# Patient Record
Sex: Male | Born: 1960 | Race: Black or African American | Hispanic: No | Smoking: Current every day smoker
Health system: Southern US, Community
[De-identification: ages and names within clinical notes are randomized; demographics above are authoritative.]

## PROBLEM LIST (undated history)

## (undated) ENCOUNTER — Emergency Department (HOSPITAL_COMMUNITY): Discharge status: Absent without leave | Payer: Self-pay

## (undated) DIAGNOSIS — G43909 Migraine, unspecified, not intractable, without status migrainosus: Secondary | ICD-10-CM

## (undated) DIAGNOSIS — G8929 Other chronic pain: Secondary | ICD-10-CM

## (undated) DIAGNOSIS — M545 Low back pain, unspecified: Secondary | ICD-10-CM

## (undated) DIAGNOSIS — E1110 Type 2 diabetes mellitus with ketoacidosis without coma: Secondary | ICD-10-CM

## (undated) DIAGNOSIS — E785 Hyperlipidemia, unspecified: Secondary | ICD-10-CM

## (undated) DIAGNOSIS — F419 Anxiety disorder, unspecified: Secondary | ICD-10-CM

## (undated) DIAGNOSIS — F32A Depression, unspecified: Secondary | ICD-10-CM

## (undated) DIAGNOSIS — I10 Essential (primary) hypertension: Secondary | ICD-10-CM

## (undated) DIAGNOSIS — F329 Major depressive disorder, single episode, unspecified: Secondary | ICD-10-CM

## (undated) DIAGNOSIS — E119 Type 2 diabetes mellitus without complications: Secondary | ICD-10-CM

## (undated) DIAGNOSIS — R569 Unspecified convulsions: Secondary | ICD-10-CM

## (undated) HISTORY — PX: NO PAST SURGERIES: SHX2092

---

## 2007-10-08 ENCOUNTER — Emergency Department (HOSPITAL_COMMUNITY): Admission: EM | Admit: 2007-10-08 | Discharge: 2007-10-08 | Payer: Self-pay | Admitting: Emergency Medicine

## 2007-10-17 ENCOUNTER — Emergency Department (HOSPITAL_COMMUNITY): Admission: EM | Admit: 2007-10-17 | Discharge: 2007-10-17 | Payer: Self-pay | Admitting: Emergency Medicine

## 2007-12-26 ENCOUNTER — Emergency Department (HOSPITAL_COMMUNITY): Admission: EM | Admit: 2007-12-26 | Discharge: 2007-12-26 | Payer: Self-pay | Admitting: Emergency Medicine

## 2008-01-12 ENCOUNTER — Emergency Department (HOSPITAL_COMMUNITY): Admission: EM | Admit: 2008-01-12 | Discharge: 2008-01-12 | Payer: Self-pay | Admitting: Emergency Medicine

## 2008-06-04 ENCOUNTER — Emergency Department (HOSPITAL_COMMUNITY): Admission: EM | Admit: 2008-06-04 | Discharge: 2008-06-04 | Payer: Self-pay | Admitting: Emergency Medicine

## 2008-06-06 ENCOUNTER — Emergency Department (HOSPITAL_COMMUNITY): Admission: EM | Admit: 2008-06-06 | Discharge: 2008-06-06 | Payer: Self-pay | Admitting: Emergency Medicine

## 2008-06-15 ENCOUNTER — Emergency Department (HOSPITAL_COMMUNITY): Admission: EM | Admit: 2008-06-15 | Discharge: 2008-06-15 | Payer: Self-pay | Admitting: Emergency Medicine

## 2008-06-19 ENCOUNTER — Emergency Department (HOSPITAL_COMMUNITY): Admission: EM | Admit: 2008-06-19 | Discharge: 2008-06-19 | Payer: Self-pay | Admitting: Family Medicine

## 2008-10-20 ENCOUNTER — Emergency Department (HOSPITAL_COMMUNITY): Admission: EM | Admit: 2008-10-20 | Discharge: 2008-10-20 | Payer: Self-pay | Admitting: Emergency Medicine

## 2008-11-02 ENCOUNTER — Ambulatory Visit (HOSPITAL_COMMUNITY): Admission: EM | Admit: 2008-11-02 | Discharge: 2008-11-02 | Payer: Self-pay | Admitting: Emergency Medicine

## 2008-11-26 ENCOUNTER — Encounter: Admission: RE | Admit: 2008-11-26 | Discharge: 2008-11-26 | Payer: Self-pay | Admitting: Family Medicine

## 2008-12-02 ENCOUNTER — Encounter: Admission: RE | Admit: 2008-12-02 | Discharge: 2009-01-11 | Payer: Self-pay | Admitting: Orthopedic Surgery

## 2009-02-28 ENCOUNTER — Emergency Department (HOSPITAL_COMMUNITY): Admission: EM | Admit: 2009-02-28 | Discharge: 2009-02-28 | Payer: Self-pay | Admitting: Emergency Medicine

## 2009-08-13 ENCOUNTER — Emergency Department (HOSPITAL_COMMUNITY): Admission: EM | Admit: 2009-08-13 | Discharge: 2009-08-13 | Payer: Self-pay | Admitting: Emergency Medicine

## 2010-02-01 ENCOUNTER — Emergency Department (HOSPITAL_COMMUNITY)
Admission: EM | Admit: 2010-02-01 | Discharge: 2010-02-01 | Payer: Self-pay | Source: Home / Self Care | Admitting: Emergency Medicine

## 2010-02-02 LAB — POCT I-STAT, CHEM 8
BUN: 8 mg/dL (ref 6–23)
Calcium, Ion: 1.16 mmol/L (ref 1.12–1.32)
Chloride: 101 mEq/L (ref 96–112)
Creatinine, Ser: 0.9 mg/dL (ref 0.4–1.5)
Glucose, Bld: 159 mg/dL — ABNORMAL HIGH (ref 70–99)
HCT: 45 % (ref 39.0–52.0)
Hemoglobin: 15.3 g/dL (ref 13.0–17.0)
Potassium: 4.1 mEq/L (ref 3.5–5.1)
Sodium: 138 mEq/L (ref 135–145)
TCO2: 31 mmol/L (ref 0–100)

## 2010-02-06 ENCOUNTER — Encounter: Payer: Self-pay | Admitting: Family Medicine

## 2010-04-02 LAB — GLUCOSE, CAPILLARY
Glucose-Capillary: 274 mg/dL — ABNORMAL HIGH (ref 70–99)
Glucose-Capillary: 307 mg/dL — ABNORMAL HIGH (ref 70–99)
Glucose-Capillary: 367 mg/dL — ABNORMAL HIGH (ref 70–99)

## 2010-04-02 LAB — POCT I-STAT, CHEM 8
BUN: 4 mg/dL — ABNORMAL LOW (ref 6–23)
Calcium, Ion: 1.11 mmol/L — ABNORMAL LOW (ref 1.12–1.32)
Chloride: 101 mEq/L (ref 96–112)
Creatinine, Ser: 0.9 mg/dL (ref 0.4–1.5)
Glucose, Bld: 350 mg/dL — ABNORMAL HIGH (ref 70–99)
HCT: 44 % (ref 39.0–52.0)
Hemoglobin: 15 g/dL (ref 13.0–17.0)
Potassium: 3.8 mEq/L (ref 3.5–5.1)
Sodium: 136 mEq/L (ref 135–145)
TCO2: 26 mmol/L (ref 0–100)

## 2010-04-06 LAB — URINALYSIS, ROUTINE W REFLEX MICROSCOPIC
Bilirubin Urine: NEGATIVE
Glucose, UA: >1000 mg/dL — AB
Hgb urine dipstick: NEGATIVE
Ketones, ur: 15 mg/dL — AB
Nitrite: NEGATIVE
Protein, ur: NEGATIVE mg/dL
Specific Gravity, Urine: 1.041 — ABNORMAL HIGH (ref 1.005–1.030)
Urobilinogen, UA: 0.2 mg/dL (ref 0.0–1.0)
pH: 6 (ref 5.0–8.0)

## 2010-04-06 LAB — URINE MICROSCOPIC-ADD ON

## 2010-04-06 LAB — BASIC METABOLIC PANEL
BUN: 10 mg/dL (ref 6–23)
CO2: 30 mEq/L (ref 19–32)
Calcium: 9.7 mg/dL (ref 8.4–10.5)
Chloride: 97 mEq/L (ref 96–112)
Creatinine, Ser: 0.89 mg/dL (ref 0.4–1.5)
GFR calc Af Amer: >60 mL/min (ref >60)
GFR calc non Af Amer: >60 mL/min (ref >60)
Glucose, Bld: 488 mg/dL — ABNORMAL HIGH (ref 70–99)
Potassium: 4.4 mEq/L (ref 3.5–5.1)
Sodium: 135 mEq/L (ref 135–145)

## 2010-04-06 LAB — GLUCOSE, CAPILLARY
Glucose-Capillary: 128 mg/dL — ABNORMAL HIGH (ref 70–99)
Glucose-Capillary: 339 mg/dL — ABNORMAL HIGH (ref 70–99)

## 2010-04-21 LAB — GLUCOSE, CAPILLARY: Glucose-Capillary: 242 mg/dL — ABNORMAL HIGH (ref 70–99)

## 2011-02-07 ENCOUNTER — Encounter (HOSPITAL_COMMUNITY): Payer: Self-pay | Admitting: Emergency Medicine

## 2011-02-07 ENCOUNTER — Emergency Department (HOSPITAL_COMMUNITY)
Admission: EM | Admit: 2011-02-07 | Discharge: 2011-02-07 | Disposition: A | Discharge status: Home / Self Care | Payer: Self-pay | Attending: Emergency Medicine | Admitting: Emergency Medicine

## 2011-02-07 DIAGNOSIS — Z794 Long term (current) use of insulin: Secondary | ICD-10-CM | POA: Insufficient documentation

## 2011-02-07 DIAGNOSIS — A599 Trichomoniasis, unspecified: Secondary | ICD-10-CM | POA: Insufficient documentation

## 2011-02-07 DIAGNOSIS — R21 Rash and other nonspecific skin eruption: Secondary | ICD-10-CM | POA: Insufficient documentation

## 2011-02-07 DIAGNOSIS — Z79899 Other long term (current) drug therapy: Secondary | ICD-10-CM | POA: Insufficient documentation

## 2011-02-07 DIAGNOSIS — L298 Other pruritus: Secondary | ICD-10-CM | POA: Insufficient documentation

## 2011-02-07 DIAGNOSIS — L2989 Other pruritus: Secondary | ICD-10-CM | POA: Insufficient documentation

## 2011-02-07 DIAGNOSIS — F341 Dysthymic disorder: Secondary | ICD-10-CM | POA: Insufficient documentation

## 2011-02-07 DIAGNOSIS — E119 Type 2 diabetes mellitus without complications: Secondary | ICD-10-CM | POA: Insufficient documentation

## 2011-02-07 HISTORY — DX: Anxiety disorder, unspecified: F41.9

## 2011-02-07 HISTORY — DX: Depression, unspecified: F32.A

## 2011-02-07 HISTORY — DX: Major depressive disorder, single episode, unspecified: F32.9

## 2011-02-07 LAB — URINALYSIS, ROUTINE W REFLEX MICROSCOPIC
Bilirubin Urine: NEGATIVE
Glucose, UA: >1000 mg/dL — AB
Hgb urine dipstick: NEGATIVE
Ketones, ur: NEGATIVE mg/dL
Leukocytes, UA: NEGATIVE
Nitrite: NEGATIVE
Protein, ur: NEGATIVE mg/dL
Specific Gravity, Urine: 1.031 — ABNORMAL HIGH (ref 1.005–1.030)
Urobilinogen, UA: 1 mg/dL (ref 0.0–1.0)
pH: 8 (ref 5.0–8.0)

## 2011-02-07 LAB — URINE MICROSCOPIC-ADD ON

## 2011-02-07 MED ORDER — METRONIDAZOLE 500 MG PO TABS
2000.0000 mg | ORAL_TABLET | Freq: Once | ORAL | Status: AC
  Administered 2011-02-07: 2000 mg via ORAL
  Filled 2011-02-07: qty 4

## 2011-02-07 NOTE — ED Provider Notes (Signed)
History     CSN: [id number]  Arrival date & time 02/07/11  1235   First MD Initiated Contact with Patient 02/07/11 1548      Chief Complaint  Patient presents with  . Rash    (Consider location/radiation/quality/duration/timing/severity/associated sxs/prior treatment) Patient is a 51 y.o. male presenting with rash. The history is provided by the patient. No language interpreter was used.  Rash  This is a new problem. The current episode started more than 2 days ago. The problem has been gradually worsening. The problem is associated with an unknown factor. There has been no fever. The rash is present on the torso, groin and genitalia. The patient is experiencing no pain. Associated symptoms include itching. Pertinent negatives include no blisters. He has tried nothing for the symptoms.    Past Medical History  Diagnosis Date  . Diabetes mellitus   . Depression   . Anxiety     History reviewed. No pertinent past surgical history.  No family history on file.  History  Substance Use Topics  . Smoking status: Current Everyday Smoker  . Smokeless tobacco: Not on file  . Alcohol Use: Yes      Review of Systems  Genitourinary: Negative for dysuria, discharge, genital sores and testicular pain.  Skin: Positive for itching and rash.  All other systems reviewed and are negative.    Allergies  Review of patient's allergies indicates no known allergies.  Home Medications   Current Outpatient Rx  Name Route Sig Dispense Refill  . CLONAZEPAM 1 MG PO TABS Oral Take 1-2 mg by mouth 2 (two) times daily. 1 mg in the morning 2 mg in the evening    . INSULIN ISOPHANE HUMAN 100 UNIT/ML [place] SUSP Subcutaneous Inject 20 Units into the skin 2 (two) times daily.      BP 158/72  Pulse 84  Temp(Src) 98.9 F (37.2 C) (Oral)  Resp 16  SpO2 97%  Physical Exam  Constitutional: He is oriented to person, place, and time. He appears well-developed and well-nourished.  HENT:  Head:  Normocephalic.  Eyes: Conjunctivae are normal. Pupils are equal, round, and reactive to light.  Neck: Normal range of motion. Neck supple.  Cardiovascular: Normal rate, regular rhythm, normal heart sounds and intact distal pulses.   Pulmonary/Chest: Effort normal and breath sounds normal.  Abdominal: Soft. Bowel sounds are normal.  Genitourinary: Testes normal and penis normal. No penile erythema or penile tenderness. No discharge found.  Musculoskeletal: Normal range of motion.  Neurological: He is alert and oriented to person, place, and time.  Skin: Skin is warm and dry.    ED Course  Procedures (including critical care time)  Labs Reviewed - No data to display No results found.   No diagnosis found.  No obvious genital lesions noted on exam.  Urine positive for trichomonas--treated in ED with flagyl.  MDM          Jimmye Norman, NP 02/07/11 [phone]

## 2011-02-07 NOTE — ED Provider Notes (Signed)
Medical screening examination/treatment/procedure(s) were performed by non-physician practitioner and as supervising physician I was immediately available for consultation/collaboration.   Benny Lennert, MD 02/07/11 [phone]

## 2011-02-07 NOTE — ED Notes (Signed)
Has  Bumps on private area  Since yesterday at they are itching

## 2011-03-17 ENCOUNTER — Emergency Department (HOSPITAL_COMMUNITY)
Admission: EM | Admit: 2011-03-17 | Discharge: 2011-03-17 | Disposition: A | Discharge status: Home / Self Care | Payer: Self-pay | Attending: Emergency Medicine | Admitting: Emergency Medicine

## 2011-03-17 ENCOUNTER — Encounter (HOSPITAL_COMMUNITY): Payer: Self-pay | Admitting: *Deleted

## 2011-03-17 DIAGNOSIS — F3289 Other specified depressive episodes: Secondary | ICD-10-CM | POA: Insufficient documentation

## 2011-03-17 DIAGNOSIS — R110 Nausea: Secondary | ICD-10-CM | POA: Insufficient documentation

## 2011-03-17 DIAGNOSIS — E119 Type 2 diabetes mellitus without complications: Secondary | ICD-10-CM | POA: Insufficient documentation

## 2011-03-17 DIAGNOSIS — R5381 Other malaise: Secondary | ICD-10-CM | POA: Insufficient documentation

## 2011-03-17 DIAGNOSIS — R739 Hyperglycemia, unspecified: Secondary | ICD-10-CM

## 2011-03-17 DIAGNOSIS — Z794 Long term (current) use of insulin: Secondary | ICD-10-CM | POA: Insufficient documentation

## 2011-03-17 DIAGNOSIS — F411 Generalized anxiety disorder: Secondary | ICD-10-CM | POA: Insufficient documentation

## 2011-03-17 DIAGNOSIS — F329 Major depressive disorder, single episode, unspecified: Secondary | ICD-10-CM | POA: Insufficient documentation

## 2011-03-17 DIAGNOSIS — F17200 Nicotine dependence, unspecified, uncomplicated: Secondary | ICD-10-CM | POA: Insufficient documentation

## 2011-03-17 LAB — POCT I-STAT, CHEM 8
Calcium, Ion: 1.16 mmol/L (ref 1.12–1.32)
Glucose, Bld: 322 mg/dL — ABNORMAL HIGH (ref 70–99)
HCT: 46 % (ref 39.0–52.0)
Hemoglobin: 15.6 g/dL (ref 13.0–17.0)
Potassium: 4 mEq/L (ref 3.5–5.1)

## 2011-03-17 LAB — KETONES, QUALITATIVE: Acetone, Bld: NEGATIVE

## 2011-03-17 LAB — GLUCOSE, CAPILLARY
Glucose-Capillary: 336 mg/dL — ABNORMAL HIGH (ref 70–99)
Glucose-Capillary: 350 mg/dL — ABNORMAL HIGH (ref 70–99)

## 2011-03-17 MED ORDER — INSULIN NPH (HUMAN) (ISOPHANE) 100 UNIT/ML ~~LOC~~ SUSP: 20.0000 [IU] | Freq: Two times a day (BID) | SUBCUTANEOUS | Status: DC

## 2011-03-17 NOTE — ED Provider Notes (Signed)
History     CSN: [id number]  Arrival date & time 03/17/11  1504   First MD Initiated Contact with Patient 03/17/11 1701      Chief Complaint  Patient presents with  . Hyperglycemia    (Consider location/radiation/quality/duration/timing/severity/associated sxs/prior treatment) HPI Comments: History of diabetes presenting with elevated blood sugar. States he's been out of his NPH for the past 2 days. Normally takes 20 units in the morning and 20 at night. He is recently changed counties does not have medicaid in [place].  He had one episode of vomiting yesterday. No abdominal pain, chest pain, shortness of breath. Does report increased thirst and urination.  The history is provided by the patient.    Past Medical History  Diagnosis Date  . Diabetes mellitus   . Depression   . Anxiety   . Anxiety     History reviewed. No pertinent past surgical history.  History reviewed. No pertinent family history.  History  Substance Use Topics  . Smoking status: Current Everyday Smoker  . Smokeless tobacco: Not on file  . Alcohol Use: Yes      Review of Systems  Constitutional: Positive for fatigue. Negative for fever, activity change and appetite change.  HENT: Negative for congestion and rhinorrhea.   Respiratory: Negative for cough, chest tightness and shortness of breath.   Cardiovascular: Negative for chest pain.  Gastrointestinal: Positive for nausea. Negative for vomiting and abdominal pain.  Musculoskeletal: Negative for back pain.  Skin: Negative for rash.  Neurological: Negative for dizziness, weakness and headaches.    Allergies  Review of patient's allergies indicates no known allergies.  Home Medications   Current Outpatient Rx  Name Route Sig Dispense Refill  . CLONAZEPAM 1 MG PO TABS Oral Take 1-2 mg by mouth 2 (two) times daily. 1 mg in the morning 2 mg in the evening    . INSULIN ISOPHANE HUMAN 100 UNIT/ML [place] SUSP Subcutaneous Inject 20 Units into  the skin 2 (two) times daily.    . INSULIN ISOPHANE HUMAN 100 UNIT/ML [place] SUSP Subcutaneous Inject 20 Units into the skin 2 (two) times daily. 10 mL 12    BP 180/96  Pulse 66  Temp(Src) 98.8 F (37.1 C) (Oral)  Resp 18  SpO2 99%  Physical Exam  Constitutional: He is oriented to person, place, and time. He appears well-developed and well-nourished. No distress.  HENT:  Head: Normocephalic and atraumatic.  Mouth/Throat: Oropharynx is clear and moist. No oropharyngeal exudate.  Eyes: Conjunctivae and EOM are normal. Pupils are equal, round, and reactive to light.  Neck: Normal range of motion. Neck supple.  Cardiovascular: Normal rate, regular rhythm and normal heart sounds.   Pulmonary/Chest: Effort normal and breath sounds normal. No respiratory distress.  Abdominal: Soft. There is no tenderness. There is no rebound and no guarding.  Musculoskeletal: Normal range of motion. He exhibits no edema and no tenderness.  Neurological: He is alert and oriented to person, place, and time. No cranial nerve deficit.  Skin: Skin is warm.    ED Course  Procedures (including critical care time)  Labs Reviewed  GLUCOSE, CAPILLARY - Abnormal; Notable for the following:    Glucose-Capillary 336 (*)    All other components within normal limits  GLUCOSE, CAPILLARY - Abnormal; Notable for the following:    Glucose-Capillary 350 (*)    All other components within normal limits  POCT I-STAT, CHEM 8 - Abnormal; Notable for the following:    Glucose, Bld 322 (*)  All other components within normal limits  KETONES, QUALITATIVE   No results found.   1. Hyperglycemia       MDM  Hyperglycemia. Clinical stable. No abdominal pain. R/o DKA.  Patient states she does have a prescription for his insulin but does not have the money to pay for it.  Jody MSW to discuss with patient.  Anion gap 7, bicarbonate 28, no evidence of DKA.      Glynn Octave, MD 03/17/11 2133

## 2011-03-17 NOTE — Discharge Instructions (Signed)
Hyperglycemia Hyperglycemia occurs when the glucose (sugar) in your blood is too high. Hyperglycemia can happen for many reasons, but it most often happens to people who do not know they have diabetes or are not managing their diabetes properly.  CAUSES  Whether you have diabetes or not, there are other causes of hyperglycemia. Hyperglycemia can occur when you have diabetes, but it can also occur in other situations that you might not be as aware of, such as: Diabetes  If you have diabetes and are having problems controlling your blood glucose, hyperglycemia could occur because of some of the following reasons:   Not following your meal plan.   Not taking your diabetes medications or not taking it properly.   Exercising less or doing less activity than you normally do.   Being sick.  Pre-diabetes  This cannot be ignored. Before people develop Type 2 diabetes, they almost always have "pre-diabetes." This is when your blood glucose levels are higher than normal, but not yet high enough to be diagnosed as diabetes. Research has shown that some long-term damage to the body, especially the heart and circulatory system, may already be occurring during pre-diabetes. If you take action to manage your blood glucose when you have pre-diabetes, you may delay or prevent Type 2 diabetes from developing.  Stress  If you have diabetes, you may be "diet" controlled or on oral medications or insulin to control your diabetes. However, you may find that your blood glucose is higher than usual in the hospital whether you have diabetes or not. This is often referred to as "stress hyperglycemia." Stress can elevate your blood glucose. This happens because of hormones put out by the body during times of stress. If stress has been the cause of your high blood glucose, it can be followed regularly by your caregiver. That way he/she can make sure your hyperglycemia does not continue to get worse or progress to diabetes.    Steroids  Steroids are medications that act on the infection fighting system (immune system) to block inflammation or infection. One side effect can be a rise in blood glucose. Most people can produce enough extra insulin to allow for this rise, but for those who cannot, steroids make blood glucose levels go even higher. It is not unusual for steroid treatments to "uncover" diabetes that is developing. It is not always possible to determine if the hyperglycemia will go away after the steroids are stopped. A special blood test called an A1c is sometimes done to determine if your blood glucose was elevated before the steroids were started.  SYMPTOMS  Thirsty.   Frequent urination.   Dry mouth.   Blurred vision.   Tired or fatigue.   Weakness.   Sleepy.   Tingling in feet or leg.  DIAGNOSIS  Diagnosis is made by monitoring blood glucose in one or all of the following ways:  A1c test. This is a chemical found in your blood.   Fingerstick blood glucose monitoring.   Laboratory results.  TREATMENT  First, knowing the cause of the hyperglycemia is important before the hyperglycemia can be treated. Treatment may include, but is not be limited to:  Education.   Change or adjustment in medications.   Change or adjustment in meal plan.   Treatment for an illness, infection, etc.   More frequent blood glucose monitoring.   Change in exercise plan.   Decreasing or stopping steroids.   Lifestyle changes.  HOME CARE INSTRUCTIONS   Test your blood glucose  as directed.   Exercise regularly. Your caregiver will give you instructions about exercise. Pre-diabetes or diabetes which comes on with stress is helped by exercising.   Eat wholesome, balanced meals. Eat often and at regular, fixed times. Your caregiver or nutritionist will give you a meal plan to guide your sugar intake.   Being at an ideal weight is important. If needed, losing as little as 10 to 15 pounds may help  improve blood glucose levels.  SEEK MEDICAL CARE IF:   You have questions about medicine, activity, or diet.   You continue to have symptoms (problems such as increased thirst, urination, or weight gain).  SEEK IMMEDIATE MEDICAL CARE IF:   You are vomiting or have diarrhea.   Your breath smells fruity.   You are breathing faster or slower.   You are very sleepy or incoherent.   You have numbness, tingling, or pain in your feet or hands.   You have chest pain.   Your symptoms get worse even though you have been following your caregiver's orders.   If you have any other questions or concerns.  Document Released: 06/28/2000 Document Revised: 09/14/2010 Document Reviewed: 08/24/2008 [facility] Patient Information 2012 [place], [place]. RESOURCE GUIDE  Dental Problems  Patients with Medicaid: [facility] [phone]                                           [phone] [address] Phone:  [phone]                                                  Phone:  [phone]  If unable to pay or uninsured, contact:  [facility] or [facility]. to become qualified for the adult dental clinic.  Chronic Pain Problems Contact [facility]  [phone] Patients need to be referred by their primary care doctor.  Insufficient Money for Medicine Contact United Way:  call "211" or [facility] 6200219557.  No Primary Care Doctor Call [facility]  [phone] Other agencies that provide inexpensive medical care    [facility]  [phone]    [facility]  [phone]    [facility]  [phone]    [facility]  [phone]    [facility]  (414)577-1664    [facility]  [phone]  Psychological Services [facility]  [phone] [facility] Services  [phone] [facility]   7346630020 (emergency services (401) [phone])  Substance Abuse  Resources [facility] Drug Services  (352) 323-6903 [facility] 8655925450 [facility] 2090586736 Floydene Flock 435-718-4343 Residential & Outpatient Substance Abuse Program  401-789-1914  Abuse/Neglect [facility] Child Abuse Hotline 260-559-0501 [facility] Child Abuse Hotline 682-138-9195 (After Hours)  Emergency Shelter [facility] [phone]  Maternity Homes [facility] at the [place] [facility] [place] (347) 617-8458 [facility] (812)[phone]  MRSA Hotline #:   718-730-9776    [facility]  [facility] Dept. [address]      [place] Phone:  [phone]                                   Phone:  [phone]                 Phone:  [place] Phone:  [place] [phone] (After Hours)

## 2011-03-17 NOTE — ED Notes (Signed)
Pt states that he is not able to get into a clinic to get his sugar monitored. Pt states he has been taking insulin but ran out. Pt reports increased thirst and urination.

## 2011-03-17 NOTE — ED Notes (Signed)
Pt. Given a lunch to eat.

## 2011-03-21 ENCOUNTER — Encounter (HOSPITAL_COMMUNITY): Payer: Self-pay | Admitting: Emergency Medicine

## 2011-03-21 ENCOUNTER — Emergency Department (HOSPITAL_COMMUNITY)
Admission: EM | Admit: 2011-03-21 | Discharge: 2011-03-21 | Disposition: A | Discharge status: Home / Self Care | Payer: Self-pay | Attending: Emergency Medicine | Admitting: Emergency Medicine

## 2011-03-21 DIAGNOSIS — Z9119 Patient's noncompliance with other medical treatment and regimen: Secondary | ICD-10-CM | POA: Insufficient documentation

## 2011-03-21 DIAGNOSIS — F411 Generalized anxiety disorder: Secondary | ICD-10-CM | POA: Insufficient documentation

## 2011-03-21 DIAGNOSIS — E119 Type 2 diabetes mellitus without complications: Secondary | ICD-10-CM | POA: Insufficient documentation

## 2011-03-21 DIAGNOSIS — Z9114 Patient's other noncompliance with medication regimen: Secondary | ICD-10-CM

## 2011-03-21 DIAGNOSIS — F17200 Nicotine dependence, unspecified, uncomplicated: Secondary | ICD-10-CM | POA: Insufficient documentation

## 2011-03-21 DIAGNOSIS — Z91199 Patient's noncompliance with other medical treatment and regimen due to unspecified reason: Secondary | ICD-10-CM | POA: Insufficient documentation

## 2011-03-21 DIAGNOSIS — F329 Major depressive disorder, single episode, unspecified: Secondary | ICD-10-CM | POA: Insufficient documentation

## 2011-03-21 DIAGNOSIS — R739 Hyperglycemia, unspecified: Secondary | ICD-10-CM

## 2011-03-21 DIAGNOSIS — F3289 Other specified depressive episodes: Secondary | ICD-10-CM | POA: Insufficient documentation

## 2011-03-21 DIAGNOSIS — Z794 Long term (current) use of insulin: Secondary | ICD-10-CM | POA: Insufficient documentation

## 2011-03-21 LAB — BASIC METABOLIC PANEL
BUN: 14 mg/dL (ref 6–23)
Creatinine, Ser: 0.77 mg/dL (ref 0.50–1.35)
GFR calc Af Amer: >90 mL/min (ref >90)
GFR calc non Af Amer: >90 mL/min (ref >90)
Potassium: 4.5 mEq/L (ref 3.5–5.1)

## 2011-03-21 LAB — CBC
MCHC: 33.2 g/dL (ref 30.0–36.0)
RDW: 12.6 % (ref 11.5–15.5)

## 2011-03-21 MED ORDER — SODIUM CHLORIDE 0.9 % IV BOLUS (SEPSIS): 1000.0000 mL | Freq: Once | INTRAVENOUS | Status: DC

## 2011-03-21 MED ORDER — INSULIN ASPART PROT & ASPART (70-30 MIX) 100 UNIT/ML ~~LOC~~ SUSP
20.0000 [IU] | Freq: Once | SUBCUTANEOUS | Status: AC
  Administered 2011-03-21: 20 [IU] via SUBCUTANEOUS
  Filled 2011-03-21: qty 3

## 2011-03-21 NOTE — Discharge Instructions (Signed)
 RESOURCE GUIDE  Dental Problems  Patients with Medicaid: [facility]                     [facility] [address]                                           [address] Phone:  [phone]                                                  Phone:  [phone]  If unable to pay or uninsured, contact:  [facility] or [facility] to become qualified for the adult dental clinic.  Chronic Pain Problems Contact [facility]  [phone] Patients need to be referred by their primary care doctor.  Insufficient Money for Medicine Contact United Way:  call "211" or [facility] 271-5999.  No Primary Care Doctor Call [facility]  832-8000 Other agencies that provide inexpensive medical care    [facility]  [phone]    [facility]  [phone]    [facility]  [phone]    Women's Clinic  [phone]    [facility]  [phone]    [facility]  [phone]  Psychological Services [facility]  [phone] [facility]  [phone] [facility]   [phone] (emergency services [phone])  Substance Abuse Resources Alcohol and Drug Services  336-882-2125 [facility] 336-784-9470 [facility] 336-285-9073 [facility] 336-845-3988 Residential & Outpatient Substance Abuse Program  800-659-3381  Abuse/Neglect [place] Child Abuse Hotline (336) 641-3795 [place] Child Abuse Hotline 800-378-5315 (After Hours)  Emergency Shelter [facility] (336) 271-5985  Maternity Homes [facility] [place] (336) 275-9566 [facility] (704) 372-4663  MRSA Hotline #:   832-7006    [place] Resources  [facility] [place]     United Way                          [facility] [address] [place]                       [address]      [address]  [place]                                                [place]                            [place] Phone:  [phone]                                   Phone:  [phone]                 Phone:  [phone]  [place] [facility] Phone:    [phone]  [place] Child Abuse Hotline (336) 342-1394 (336) 342-3537 (After Hours)   

## 2011-03-21 NOTE — ED Notes (Signed)
Here for insulin  Was here the other day and given rx but did not take it because he did not have $

## 2011-03-21 NOTE — ED Provider Notes (Addendum)
History     CSN: [id number]  Arrival date & time 03/21/11  1422   First MD Initiated Contact with Patient 03/21/11 1827      Chief Complaint  Patient presents with  . Hyperglycemia     The history is provided by the patient.   patient reports he is currently out of his insulin and needs additional insulin.  He has already been seen by the ER social worker in the emergency department and given outpatient resources.  He reports he feels fine at this time.  He has no chest pain or shortness of breath.  He has no nausea vomiting or diarrhea.  He has no lightheadedness or weakness.  He reports his finances are not allowing for him to pay for his insulin.    Past Medical History  Diagnosis Date  . Diabetes mellitus   . Depression   . Anxiety   . Anxiety     History reviewed. No pertinent past surgical history.  No family history on file.  History  Substance Use Topics  . Smoking status: Current Everyday Smoker  . Smokeless tobacco: Not on file  . Alcohol Use: Yes      Review of Systems  All other systems reviewed and are negative.    Allergies  Review of patient's allergies indicates no known allergies.  Home Medications   Current Outpatient Rx  Name Route Sig Dispense Refill  . CLONAZEPAM 1 MG PO TABS Oral Take 1-2 mg by mouth 2 (two) times daily. Take 1 tablet in the morning and 2 tablets at bedtime    . INSULIN ISOPHANE HUMAN 100 UNIT/ML [place] SUSP Subcutaneous Inject 20 Units into the skin 2 (two) times daily.    . TETRAHYDROZOLINE HCL 0.05 % OP SOLN Left Eye Place 1 drop into the left eye 2 (two) times daily as needed. For eye irritation      BP 159/87  Pulse 87  Temp 99.3 F (37.4 C)  Resp 18  SpO2 98%  Physical Exam  Nursing note and vitals reviewed. Constitutional: He is oriented to person, place, and time. He appears well-developed and well-nourished.  HENT:  Head: Normocephalic and atraumatic.  Eyes: EOM are normal.  Neck: Normal range of motion.    Cardiovascular: Normal rate, regular rhythm, normal heart sounds and intact distal pulses.   Pulmonary/Chest: Effort normal and breath sounds normal. No respiratory distress.  Abdominal: Soft. He exhibits no distension. There is no tenderness.  Musculoskeletal: Normal range of motion.  Neurological: He is alert and oriented to person, place, and time.  Skin: Skin is warm and dry.  Psychiatric: He has a normal mood and affect. Judgment normal.    ED Course  Procedures (including critical care time)  Labs Reviewed  GLUCOSE, CAPILLARY - Abnormal; Notable for the following:    Glucose-Capillary 422 (*)    All other components within normal limits  BASIC METABOLIC PANEL - Abnormal; Notable for the following:    Sodium 133 (*)    Glucose, Bld 352 (*)    All other components within normal limits  CBC   No results found.   1. Hyperglycemia   2. Hx of medication noncompliance       MDM  The patient has known diabetes.  He's been noncompliant with his insulin because he does not have the money to afford it.  His blood sugar here is 352.  He has no evidence of diabetic ketoacidosis.  He feels fine.  His vital signs are  otherwise normal.  His had been seen by the social worker here in the ER and has been arranged as an outpatient to obtain his medications through obtaining an orange card.  I suspect his blood sugar is normally poorly controlled and 350 for him is probably not very far off his baseline.         Lyanne Co, MD 03/22/11 1610  Lyanne Co, MD 04/21/11 [phone]

## 2011-03-21 NOTE — ED Notes (Signed)
Pt does not want IV placement or IV fluids until he speaks with the MD

## 2011-03-23 NOTE — ED Notes (Signed)
Rcvd call regarding pts inability to pay for prescribed Insulin will call in to [facility] outpatient pharmacy and a nurse from congregational nursing program will pick up for pt.

## 2011-09-29 ENCOUNTER — Inpatient Hospital Stay (HOSPITAL_COMMUNITY): Admission: AD | Admit: 2011-09-29 | Payer: Self-pay | Source: Ambulatory Visit | Admitting: Obstetrics & Gynecology

## 2014-04-20 ENCOUNTER — Emergency Department (HOSPITAL_COMMUNITY): Payer: Self-pay

## 2014-04-20 ENCOUNTER — Encounter (HOSPITAL_COMMUNITY): Payer: Self-pay | Admitting: Emergency Medicine

## 2014-04-20 ENCOUNTER — Emergency Department (HOSPITAL_COMMUNITY)
Admission: EM | Admit: 2014-04-20 | Discharge: 2014-04-20 | Disposition: A | Discharge status: Home / Self Care | Payer: Self-pay | Attending: Emergency Medicine | Admitting: Emergency Medicine

## 2014-04-20 ENCOUNTER — Other Ambulatory Visit (HOSPITAL_COMMUNITY): Payer: Self-pay

## 2014-04-20 DIAGNOSIS — F329 Major depressive disorder, single episode, unspecified: Secondary | ICD-10-CM | POA: Insufficient documentation

## 2014-04-20 DIAGNOSIS — R0609 Other forms of dyspnea: Secondary | ICD-10-CM

## 2014-04-20 DIAGNOSIS — Z794 Long term (current) use of insulin: Secondary | ICD-10-CM | POA: Insufficient documentation

## 2014-04-20 DIAGNOSIS — F419 Anxiety disorder, unspecified: Secondary | ICD-10-CM | POA: Insufficient documentation

## 2014-04-20 DIAGNOSIS — I10 Essential (primary) hypertension: Secondary | ICD-10-CM | POA: Insufficient documentation

## 2014-04-20 DIAGNOSIS — Z720 Tobacco use: Secondary | ICD-10-CM | POA: Insufficient documentation

## 2014-04-20 DIAGNOSIS — E119 Type 2 diabetes mellitus without complications: Secondary | ICD-10-CM | POA: Insufficient documentation

## 2014-04-20 DIAGNOSIS — R0600 Dyspnea, unspecified: Secondary | ICD-10-CM | POA: Insufficient documentation

## 2014-04-20 DIAGNOSIS — Z79899 Other long term (current) drug therapy: Secondary | ICD-10-CM | POA: Insufficient documentation

## 2014-04-20 HISTORY — DX: Essential (primary) hypertension: I10

## 2014-04-20 HISTORY — DX: Hyperlipidemia, unspecified: E78.5

## 2014-04-20 LAB — BASIC METABOLIC PANEL
Anion gap: 15 (ref 5–15)
BUN: 9 mg/dL (ref 6–23)
CALCIUM: 8.9 mg/dL (ref 8.4–10.5)
CO2: 23 mmol/L (ref 19–32)
CREATININE: 0.77 mg/dL (ref 0.50–1.35)
Chloride: 98 mmol/L (ref 96–112)
GLUCOSE: 233 mg/dL — AB (ref 70–99)
Potassium: 3.8 mmol/L (ref 3.5–5.1)
Sodium: 136 mmol/L (ref 135–145)

## 2014-04-20 LAB — CBC WITH DIFFERENTIAL/PLATELET
Basophils Absolute: 0 10*3/uL (ref 0.0–0.1)
Basophils Relative: 1 % (ref 0–1)
EOS ABS: 0.1 10*3/uL (ref 0.0–0.7)
EOS PCT: 2 % (ref 0–5)
HEMATOCRIT: 35.4 % — AB (ref 39.0–52.0)
HEMOGLOBIN: 11.5 g/dL — AB (ref 13.0–17.0)
LYMPHS ABS: 2.3 10*3/uL (ref 0.7–4.0)
LYMPHS PCT: 42 % (ref 12–46)
MCH: 25.9 pg — AB (ref 26.0–34.0)
MCHC: 32.5 g/dL (ref 30.0–36.0)
MCV: 79.7 fL (ref 78.0–100.0)
MONO ABS: 0.5 10*3/uL (ref 0.1–1.0)
MONOS PCT: 9 % (ref 3–12)
Neutro Abs: 2.6 10*3/uL (ref 1.7–7.7)
Neutrophils Relative %: 46 % (ref 43–77)
PLATELETS: 194 10*3/uL (ref 150–400)
RBC: 4.44 MIL/uL (ref 4.22–5.81)
RDW: 13.7 % (ref 11.5–15.5)
WBC: 5.6 10*3/uL (ref 4.0–10.5)

## 2014-04-20 LAB — CBG MONITORING, ED: Glucose-Capillary: 214 mg/dL — ABNORMAL HIGH (ref 70–99)

## 2014-04-20 LAB — TROPONIN I
Troponin I: <0.03 ng/mL
Troponin I: <0.03 ng/mL

## 2014-04-20 NOTE — Progress Notes (Addendum)
CSW stopped in hallway after patient was discharged. Pt requesting assistance with diabetic supplies. Pt aware that rn cm can assist further. Pt and family waiting in waiting room however can also be reached by phone. CSW looking into available resources.   Olga CoasterKristen Vasily Fedewa, [name]  Clinical Social Work  [facility] [phone]    CSW [facility], and talk with congregational nursing about diabetic supplies. CSW and RN CM to follow up with patient regarding potential aditional resources. CSW confirmed number in chart is patient best contact number. Pt asked csw if he could be given dose of anxiety medication for the day. CSW discussed with assisted director of ED, as patient has been discharged patient would have to check back in, and it still was not a guarantee of receiving medications.   Olga CoasterKristen Tandi Hanko, [name]  Clinical Social Work  [facility] [phone]

## 2014-04-20 NOTE — ED Notes (Signed)
Pt states for the past week he has been "having difficulty breathing." At this time respirations are regular and unlabored, lung sounds clear. Pt also states that sometimes while he is being active it feels like his heart is racing. Pt states he has hx of hypertension, high cholesterol, and anxiety and he can't afford his medications.

## 2014-04-20 NOTE — ED Notes (Signed)
Patient states he is having trouble breathing x1 week. Patient speaking in full sentences, NAD noted. Patient with multiple medical conditions states he is not taking his home meds, last dose >1 week ago.

## 2014-04-20 NOTE — ED Provider Notes (Signed)
CSN: [id number]     Arrival date & time 04/20/14  0059 History   First MD Initiated Contact with Patient 04/20/14 0132     Chief Complaint  Patient presents with  . difficulty breathing x1 week      (Consider location/radiation/quality/duration/timing/severity/associated sxs/prior Treatment) Patient is a 54 y.o. male presenting with shortness of breath. The history is provided by the patient. No language interpreter was used.  Shortness of Breath Severity:  Mild Duration:  1 week Associated symptoms: no abdominal pain, no chest pain, no cough and no fever   Associated symptoms comment:  Exertional SOB for the past week. He denies cough, fever or chest pain. The patient is a difficult historian. He has a history significant for insulin dependent DM, HTN, anxiety. No nausea, vomiting. He is currently asymptomatic.    Past Medical History  Diagnosis Date  . Diabetes mellitus   . Depression   . Anxiety   . Anxiety   . Hypertension   . Hyperlipemia    History reviewed. No pertinent past surgical history. History reviewed. No pertinent family history. History  Substance Use Topics  . Smoking status: Current Every Day Smoker -- 0.10 packs/day    Types: Cigarettes  . Smokeless tobacco: Not on file  . Alcohol Use: 7.2 oz/week    12 Cans of beer per week    Review of Systems  Constitutional: Negative for fever and chills.  HENT: Negative.   Respiratory: Positive for shortness of breath. Negative for cough.   Cardiovascular: Negative.  Negative for chest pain.  Gastrointestinal: Negative.  Negative for abdominal pain.  Musculoskeletal: Negative.  Negative for myalgias.  Skin: Negative.   Neurological: Negative.       Allergies  Ibuprofen  Home Medications   Prior to Admission medications   Medication Sig Start Date End Date Taking? Authorizing Provider  clonazePAM (KLONOPIN) 1 MG tablet Take 1-2 mg by mouth 2 (two) times daily. Take 1 tablet in the morning and 2 tablets  at bedtime    Historical Provider, MD  insulin NPH (HUMULIN N,NOVOLIN N) 100 UNIT/ML injection Inject 20 Units into the skin 2 (two) times daily.    Historical Provider, MD  tetrahydrozoline 0.05 % ophthalmic solution Place 1 drop into the left eye 2 (two) times daily as needed. For eye irritation    Historical Provider, MD   BP 180/66 mmHg  Pulse 88  Temp(Src) 98.3 F (36.8 C) (Oral)  Resp 20  SpO2 100% Physical Exam  Constitutional: He is oriented to person, place, and time. He appears well-developed and well-nourished.  HENT:  Head: Normocephalic.  Neck: Normal range of motion. Neck supple.  Cardiovascular: Normal rate and regular rhythm.   Pulmonary/Chest: Effort normal and breath sounds normal.  Abdominal: Soft. Bowel sounds are normal. There is no tenderness. There is no rebound and no guarding.  Musculoskeletal: Normal range of motion. He exhibits no edema.  Neurological: He is alert and oriented to person, place, and time.  Skin: Skin is warm and dry. No rash noted.  Psychiatric: He has a normal mood and affect.    ED Course  Procedures (including critical care time) Labs Review Labs Reviewed  CBC WITH DIFFERENTIAL/PLATELET - Abnormal; Notable for the following:    Hemoglobin 11.5 (*)    HCT 35.4 (*)    MCH 25.9 (*)    All other components within normal limits  CBG MONITORING, ED - Abnormal; Notable for the following:    Glucose-Capillary 214 (*)  All other components within normal limits  TROPONIN I  BASIC METABOLIC PANEL   Results for orders placed or performed during the hospital encounter of 04/20/14  Troponin I  Result Value Ref Range   Troponin I <0.03 <0.031 ng/mL  Basic metabolic panel  Result Value Ref Range   Sodium 136 135 - 145 mmol/L   Potassium 3.8 3.5 - 5.1 mmol/L   Chloride 98 96 - 112 mmol/L   CO2 23 19 - 32 mmol/L   Glucose, Bld 233 (H) 70 - 99 mg/dL   BUN 9 6 - 23 mg/dL   Creatinine, Ser 1.610.77 0.50 - 1.35 mg/dL   Calcium 8.9 8.4 - 09.610.5  mg/dL   GFR calc non Af Amer >90 >90 mL/min   GFR calc Af Amer >90 >90 mL/min   Anion gap 15 5 - 15  CBC with Differential  Result Value Ref Range   WBC 5.6 4.0 - 10.5 K/uL   RBC 4.44 4.22 - 5.81 MIL/uL   Hemoglobin 11.5 (L) 13.0 - 17.0 g/dL   HCT 04.535.4 (L) 40.939.0 - 81.152.0 %   MCV 79.7 78.0 - 100.0 fL   MCH 25.9 (L) 26.0 - 34.0 pg   MCHC 32.5 30.0 - 36.0 g/dL   RDW 91.413.7 78.211.5 - 95.615.5 %   Platelets 194 150 - 400 K/uL   Neutrophils Relative % 46 43 - 77 %   Neutro Abs 2.6 1.7 - 7.7 K/uL   Lymphocytes Relative 42 12 - 46 %   Lymphs Abs 2.3 0.7 - 4.0 K/uL   Monocytes Relative 9 3 - 12 %   Monocytes Absolute 0.5 0.1 - 1.0 K/uL   Eosinophils Relative 2 0 - 5 %   Eosinophils Absolute 0.1 0.0 - 0.7 K/uL   Basophils Relative 1 0 - 1 %   Basophils Absolute 0.0 0.0 - 0.1 K/uL  Troponin I  Result Value Ref Range   Troponin I <0.03 <0.031 ng/mL  CBG monitoring, ED  Result Value Ref Range   Glucose-Capillary 214 (H) 70 - 99 mg/dL   Comment 1 Notify RN    Dg Chest 2 View  04/20/2014   CLINICAL DATA:  Difficulty breathing for a week. Shortness of breath. Feels like heart racing while he is being active. Current smoker.  EXAM: CHEST  2 VIEW  COMPARISON:  06/19/2008  FINDINGS: The heart size and mediastinal contours are within normal limits. Both lungs are clear. The visualized skeletal structures are unremarkable.  IMPRESSION: No active cardiopulmonary disease.   Electronically Signed   By: Burman NievesWilliam  Stevens M.D.   On: 04/20/2014 02:50    Imaging Review No results found.   EKG Interpretation None      MDM   Final diagnoses:  None    1. Dyspnea  The patient had a concerning EKG without comparable. REviewed by Dr. Patria Maneampos who saw the patient to attempt to elicit a more indepth history, which continues to be difficult. The patient denies chest pain. SOB is intermittent. No cough, fever. Normal lab studies including delta troponin - doubt ACS despite EKG changes. ST elevations likely early  repolarization and LVH - per Dr. Patria Maneampos. Stable for discharge. Will refer to cardiology for consideration of holter monitor.     Elpidio AnisShari Glyndon Tursi, PA-C 04/20/14 0559  Azalia BilisKevin Campos, MD 04/20/14 [phone]

## 2014-04-20 NOTE — Discharge Instructions (Signed)

## 2014-04-25 ENCOUNTER — Encounter (HOSPITAL_COMMUNITY): Payer: Self-pay | Admitting: Emergency Medicine

## 2014-04-25 ENCOUNTER — Emergency Department (HOSPITAL_COMMUNITY)
Admission: EM | Admit: 2014-04-25 | Discharge: 2014-04-26 | Disposition: A | Discharge status: Home / Self Care | Payer: Self-pay | Attending: Emergency Medicine | Admitting: Emergency Medicine

## 2014-04-25 DIAGNOSIS — E119 Type 2 diabetes mellitus without complications: Secondary | ICD-10-CM | POA: Insufficient documentation

## 2014-04-25 DIAGNOSIS — F329 Major depressive disorder, single episode, unspecified: Secondary | ICD-10-CM | POA: Insufficient documentation

## 2014-04-25 DIAGNOSIS — F419 Anxiety disorder, unspecified: Secondary | ICD-10-CM | POA: Insufficient documentation

## 2014-04-25 DIAGNOSIS — Z720 Tobacco use: Secondary | ICD-10-CM | POA: Insufficient documentation

## 2014-04-25 DIAGNOSIS — Z79899 Other long term (current) drug therapy: Secondary | ICD-10-CM | POA: Insufficient documentation

## 2014-04-25 DIAGNOSIS — Z794 Long term (current) use of insulin: Secondary | ICD-10-CM | POA: Insufficient documentation

## 2014-04-25 DIAGNOSIS — I10 Essential (primary) hypertension: Secondary | ICD-10-CM | POA: Insufficient documentation

## 2014-04-25 MED ORDER — HYDROXYZINE HCL 10 MG PO TABS: 10.0000 mg | ORAL_TABLET | Freq: Four times a day (QID) | ORAL | Status: DC | PRN

## 2014-04-25 MED ORDER — HYDROXYZINE HCL 10 MG PO TABS
10.0000 mg | ORAL_TABLET | Freq: Once | ORAL | Status: AC
  Administered 2014-04-26: 10 mg via ORAL
  Filled 2014-04-25: qty 1

## 2014-04-25 NOTE — ED Notes (Signed)
Pt reports increasing anxiety since running out of anxiety medication x 4 days ago; pt reports "cant think right"; pt denies SI/HI thoughts at this time

## 2014-04-25 NOTE — Discharge Instructions (Signed)
Generalized Anxiety Disorder Generalized anxiety disorder (GAD) is a mental disorder. It interferes with life functions, including relationships, work, and school. GAD is different from normal anxiety, which everyone experiences at some point in their lives in response to specific life events and activities. Normal anxiety actually helps us prepare for and get through these life events and activities. Normal anxiety goes away after the event or activity is over.  GAD causes anxiety that is not necessarily related to specific events or activities. It also causes excess anxiety in proportion to specific events or activities. The anxiety associated with GAD is also difficult to control. GAD can vary from mild to severe. People with severe GAD can have intense waves of anxiety with physical symptoms (panic attacks).  SYMPTOMS The anxiety and worry associated with GAD are difficult to control. This anxiety and worry are related to many life events and activities and also occur more days than not for 6 months or longer. People with GAD also have three or more of the following symptoms (one or more in children):  Restlessness.   Fatigue.  Difficulty concentrating.   Irritability.  Muscle tension.  Difficulty sleeping or unsatisfying sleep. DIAGNOSIS GAD is diagnosed through an assessment by your health care provider. Your health care provider will ask you questions aboutyour mood,physical symptoms, and events in your life. Your health care provider may ask you about your medical history and use of alcohol or drugs, including prescription medicines. Your health care provider may also do a physical exam and blood tests. Certain medical conditions and the use of certain substances can cause symptoms similar to those associated with GAD. Your health care provider may refer you to a mental health specialist for further evaluation. TREATMENT The following therapies are usually used to treat GAD:    Medication. Antidepressant medication usually is prescribed for long-term daily control. Antianxiety medicines may be added in severe cases, especially when panic attacks occur.   Talk therapy (psychotherapy). Certain types of talk therapy can be helpful in treating GAD by providing support, education, and guidance. A form of talk therapy called cognitive behavioral therapy can teach you healthy ways to think about and react to daily life events and activities.  Stress managementtechniques. These include yoga, meditation, and exercise and can be very helpful when they are practiced regularly. A mental health specialist can help determine which treatment is best for you. Some people see improvement with one therapy. However, other people require a combination of therapies. Document Released: 04/29/2012 Document Revised: 05/19/2013 Document Reviewed: 04/29/2012 Exit[employer] Patient Information 2015 [employer]. This information is not intended to replace advice given to you by your health care provider. Make sure you discuss any questions you have with your health care provider.  

## 2014-04-25 NOTE — ED Notes (Signed)
PA Dansie at bedside  

## 2014-04-26 NOTE — ED Provider Notes (Signed)
CSN: [id number]     Arrival date & time 04/25/14  2116 History   First MD Initiated Contact with Patient 04/25/14 2341     Chief Complaint  Patient presents with  . Anxiety  . Medication Refill   Mike [name] is a 54 y.o. male with a history of anxiety and depression who presents to the emergency department complaining of increased anxiety over the past 4 days as he is out of his anxiety medication. He is requesting a refill on his Klonopin. He reports his Klonopin is normally prescribed by a provider at [facility] however he was too early with his refill recently. He reports he will follow up with [facility] on Monday. Patient reports he's been out of his Klonopin for 4 days now. The patient denies suicidal or homicidal ideations. He denies depressed mood. He just reports feeling anxious. He reports his breathing has improved since he was last seen in the emergency department. The patient denies fevers, chills, suicidal ideations, homicidal ideations, shortness of breath, or rashes. He denies needing refills on other medications.   (Consider location/radiation/quality/duration/timing/severity/associated sxs/prior Treatment) HPI  Past Medical History  Diagnosis Date  . Diabetes mellitus   . Depression   . Anxiety   . Anxiety   . Hypertension   . Hyperlipemia    History reviewed. No pertinent past surgical history. No family history on file. History  Substance Use Topics  . Smoking status: Current Every Day Smoker -- 0.10 packs/day    Types: Cigarettes  . Smokeless tobacco: Not on file  . Alcohol Use: 7.2 oz/week    12 Cans of beer per week     Comment: occasional    Review of Systems  Constitutional: Negative for fever and chills.  Respiratory: Negative for shortness of breath.   Skin: Negative for rash.  Psychiatric/Behavioral: Negative for suicidal ideas, hallucinations, confusion, sleep disturbance and dysphoric mood. The patient is nervous/anxious.       Allergies   Ibuprofen and Tylenol  Home Medications   Prior to Admission medications   Medication Sig Start Date End Date Taking? Authorizing Provider  clonazePAM (KLONOPIN) 1 MG tablet Take 1-2 mg by mouth 2 (two) times daily. Take 1 tablet in the morning and 2 tablets at bedtime    Historical Provider, MD  hydrOXYzine (ATARAX/VISTARIL) 10 MG tablet Take 1 tablet (10 mg total) by mouth every 6 (six) hours as needed for anxiety. 04/25/14   Everlene Farrier, PA-C  insulin NPH (HUMULIN N,NOVOLIN N) 100 UNIT/ML injection Inject 20 Units into the skin daily before breakfast.     Historical Provider, MD  tetrahydrozoline 0.05 % ophthalmic solution Place 1 drop into the left eye 2 (two) times daily as needed. For eye irritation    Historical Provider, MD   BP 184/83 mmHg  Pulse 80  Temp(Src) 98.5 F (36.9 C) (Oral)  Resp 18  Ht  (1.93 m)  Wt 160 lb (72.576 kg)  BMI 19.48 kg/m2  SpO2 98% Physical Exam  Constitutional: He is oriented to person, place, and time. He appears well-developed and well-nourished. No distress.  HENT:  Head: Normocephalic and atraumatic.  Mouth/Throat: No oropharyngeal exudate.  Eyes: Conjunctivae are normal. Pupils are equal, round, and reactive to light. Right eye exhibits no discharge. Left eye exhibits no discharge.  Neck: Neck supple.  Cardiovascular: Normal rate, regular rhythm, normal heart sounds and intact distal pulses.   Pulmonary/Chest: Effort normal and breath sounds normal. No respiratory distress. He has no wheezes. He has no  rales.  Lymphadenopathy:    He has no cervical adenopathy.  Neurological: He is alert and oriented to person, place, and time. Coordination normal.  Skin: Skin is warm and dry. No rash noted. He is not diaphoretic. No erythema. No pallor.  Psychiatric: His behavior is normal. Thought content normal. His mood appears anxious. His affect is not angry, not blunt, not labile and not inappropriate. His speech is not rapid and/or pressured.  Thought content is not paranoid. He does not exhibit a depressed mood. He expresses no homicidal and no suicidal ideation.  The patient does S[place] slightly anxious during interview. He denies suicidal or homicidal ideations. He makes good eye contact. He is alert and oriented 3.  Nursing note and vitals reviewed.   ED Course  Procedures (including critical care time) Labs Review Labs Reviewed - No data to display  Imaging Review No results found.   EKG Interpretation None     Filed Vitals:   04/25/14 2152  BP: 184/83  Pulse: 80  Temp: 98.5 F (36.9 C)  TempSrc: Oral  Resp: 18  Height: 6\' 4"  (1.93 m)  Weight: 160 lb (72.576 kg)  SpO2: 98%    MDM   Meds given in ED:  Medications  hydrOXYzine (ATARAX/VISTARIL) tablet 10 mg (not administered)    New Prescriptions   HYDROXYZINE (ATARAX/VISTARIL) 10 MG TABLET    Take 1 tablet (10 mg total) by mouth every 6 (six) hours as needed for anxiety.    Final diagnoses:  Anxiety   This is a 54 y.o. male with a history of anxiety and depression who presents to the emergency department complaining of increased anxiety over the past 4 days as he is out of his anxiety medication. He is requesting a refill on his Klonopin. He reports [facility] however he was too early with his refill recently. [facility]. He denies suicidal or homicidal ideations. He denies depressed mood. The patient is alert and oriented 3. The patient is afebrile and nontoxic appearing. He has not taken Klonopin in 4 days now. Will give him Atarax in ED for anxiety and discharge him with a sore course of Atarax for anxiety, until he can follow-up with [facility] on Monday. I advised the patient to follow-up with their primary care provider this week. I advised the patient to return to the emergency department with new or worsening symptoms or new concerns. The patient verbalized  understanding and agreement with plan.       Everlene FarrierWilliam Kallie Depolo, PA-C 04/26/14 0006  Loren Raceravid Yelverton, MD 04/26/14 [phone]

## 2014-04-26 NOTE — ED Notes (Signed)
Waiting for medication to come from pharmacy before d/c. Pt made aware. Pt given Malaw[place]urkey sandwich, graham crackers, and water per request.

## 2014-04-29 ENCOUNTER — Emergency Department (HOSPITAL_COMMUNITY)
Admission: EM | Admit: 2014-04-29 | Discharge: 2014-04-29 | Disposition: A | Discharge status: Home / Self Care | Payer: Self-pay | Attending: Emergency Medicine | Admitting: Emergency Medicine

## 2014-04-29 DIAGNOSIS — F419 Anxiety disorder, unspecified: Secondary | ICD-10-CM | POA: Insufficient documentation

## 2014-04-29 LAB — BASIC METABOLIC PANEL
ANION GAP: 14 (ref 5–15)
BUN: 9 mg/dL (ref 6–23)
CHLORIDE: 100 mmol/L (ref 96–112)
CO2: 22 mmol/L (ref 19–32)
CREATININE: 0.9 mg/dL (ref 0.50–1.35)
Calcium: 8.7 mg/dL (ref 8.4–10.5)
GFR calc Af Amer: >90 mL/min (ref >90)
GFR calc non Af Amer: >90 mL/min (ref >90)
GLUCOSE: 444 mg/dL — AB (ref 70–99)
POTASSIUM: 4.2 mmol/L (ref 3.5–5.1)
Sodium: 136 mmol/L (ref 135–145)

## 2014-04-29 LAB — CBC
HCT: 34.6 % — ABNORMAL LOW (ref 39.0–52.0)
Hemoglobin: 11.3 g/dL — ABNORMAL LOW (ref 13.0–17.0)
MCH: 26.3 pg (ref 26.0–34.0)
MCHC: 32.7 g/dL (ref 30.0–36.0)
MCV: 80.7 fL (ref 78.0–100.0)
PLATELETS: 237 10*3/uL (ref 150–400)
RBC: 4.29 MIL/uL (ref 4.22–5.81)
RDW: 14.2 % (ref 11.5–15.5)
WBC: 4.7 10*3/uL (ref 4.0–10.5)

## 2014-04-29 LAB — I-STAT TROPONIN, ED: Troponin i, poc: 0 ng/mL (ref 0.00–0.08)

## 2014-04-29 MED ORDER — LORAZEPAM 1 MG PO TABS
1.0000 mg | ORAL_TABLET | Freq: Once | ORAL | Status: AC
  Administered 2014-04-29: 1 mg via ORAL
  Filled 2014-04-29: qty 1

## 2014-04-29 NOTE — ED Notes (Signed)
Family at bedside. 

## 2014-04-29 NOTE — Discharge Instructions (Signed)

## 2014-04-29 NOTE — ED Notes (Signed)
Patient is alert and orientedx4.  Patient was explained discharge instructions and they understood them with no questions.   

## 2014-04-29 NOTE — ED Provider Notes (Signed)
CSN: [id number]     Arrival date & time 04/29/14  0109 History   First MD Initiated Contact with Patient 04/29/14 0416     No chief complaint on file.    (Consider location/radiation/quality/duration/timing/severity/associated sxs/prior Treatment) HPI Comments: Reports anxiety all day. No instigating, alleviating, exacerbating factors. No N/V/D. No recent illness. Reports he was seen 5 days ago and given medication for this, but hasn't had it filled yet.  Patient is a 54 y.o. male presenting with anxiety. The history is provided by the patient.  Anxiety This is a recurrent problem. The current episode started 6 to 12 hours ago. The problem occurs constantly. The problem has not changed since onset.Pertinent negatives include no abdominal pain and no shortness of breath. Nothing aggravates the symptoms. Nothing relieves the symptoms.    No past medical history on file. No past surgical history on file. No family history on file. History  Substance Use Topics  . Smoking status: Not on file  . Smokeless tobacco: Not on file  . Alcohol Use: Not on file    Review of Systems  Constitutional: Negative for fever.  Respiratory: Negative for cough and shortness of breath.   Gastrointestinal: Negative for vomiting and abdominal pain.  All other systems reviewed and are negative.     Allergies  Review of patient's allergies indicates not on file.  Home Medications   Prior to Admission medications   Not on File   BP 158/84 mmHg  Pulse 74  Resp 16  SpO2 96% Physical Exam  Constitutional: He is oriented to person, place, and time. He appears well-developed and well-nourished. No distress.  HENT:  Head: Normocephalic and atraumatic.  Mouth/Throat: No oropharyngeal exudate.  Eyes: EOM are normal. Pupils are equal, round, and reactive to light.  Neck: Normal range of motion. Neck supple.  Cardiovascular: Normal rate and regular rhythm.  Exam reveals no friction rub.   No murmur  heard. Pulmonary/Chest: Effort normal and breath sounds normal. No respiratory distress. He has no wheezes. He has no rales.  Abdominal: He exhibits no distension. There is no tenderness. There is no rebound.  Musculoskeletal: Normal range of motion. He exhibits no edema.  Neurological: He is alert and oriented to person, place, and time.  Skin: He is not diaphoretic.  Nursing note and vitals reviewed.   ED Course  Procedures (including critical care time) Labs Review Labs Reviewed  CBC - Abnormal; Notable for the following:    Hemoglobin 11.3 (*)    HCT 34.6 (*)    All other components within normal limits  BASIC METABOLIC PANEL  I-STAT TROPOININ, ED    Imaging Review No results found.   EKG Interpretation   Date/Time:  Wednesday April 29 2014 04:31:39 EDT Ventricular Rate:  71 PR Interval:  146 QRS Duration: 90 QT Interval:  417 QTC Calculation: 453 R Axis:   49 Text Interpretation:  Sinus rhythm Consider left ventricular hypertrophy  ST elevation suggests acute pericarditis No STEMI Confirmed by Gwendolyn Grant  MD,  Miklos Bidinger ([id number]) on 04/29/2014 4:58:45 AM      MDM   Final diagnoses:  Anxiety    54 year old male here stating he said cause anxiety shortness of breath and chest pain all day. He states no nausea, vomiting, stressors. He was seen here 5 days ago and given medicine for this but did not get it filled. He is here sleeping comfortably, not tachycardic, does not appear edges. Exam is benign. EKG shows benign early repolarization. Given ativan for  his anxiety. Patient doesn't endorse sharp chest pain that's worse with lying down and better with leaning forward. No clinical suspicion for pericarditis. Patient's troponin negative, labs otherwise ok. Instructed to f/u with his PCP.    Elwin MochaBlair Jyll Tomaro, MD 04/29/14 [phone]

## 2014-05-02 ENCOUNTER — Encounter (HOSPITAL_COMMUNITY): Payer: Self-pay | Admitting: Emergency Medicine

## 2014-05-02 ENCOUNTER — Emergency Department (HOSPITAL_COMMUNITY)
Admission: EM | Admit: 2014-05-02 | Discharge: 2014-05-03 | Disposition: A | Discharge status: Home / Self Care | Payer: Self-pay | Attending: Emergency Medicine | Admitting: Emergency Medicine

## 2014-05-02 DIAGNOSIS — F419 Anxiety disorder, unspecified: Secondary | ICD-10-CM | POA: Insufficient documentation

## 2014-05-02 DIAGNOSIS — I10 Essential (primary) hypertension: Secondary | ICD-10-CM | POA: Insufficient documentation

## 2014-05-02 DIAGNOSIS — F329 Major depressive disorder, single episode, unspecified: Secondary | ICD-10-CM | POA: Insufficient documentation

## 2014-05-02 DIAGNOSIS — J069 Acute upper respiratory infection, unspecified: Secondary | ICD-10-CM | POA: Insufficient documentation

## 2014-05-02 DIAGNOSIS — B9789 Other viral agents as the cause of diseases classified elsewhere: Secondary | ICD-10-CM

## 2014-05-02 DIAGNOSIS — Z79899 Other long term (current) drug therapy: Secondary | ICD-10-CM | POA: Insufficient documentation

## 2014-05-02 DIAGNOSIS — E119 Type 2 diabetes mellitus without complications: Secondary | ICD-10-CM | POA: Insufficient documentation

## 2014-05-02 DIAGNOSIS — Z720 Tobacco use: Secondary | ICD-10-CM | POA: Insufficient documentation

## 2014-05-02 NOTE — ED Provider Notes (Signed)
CSN: [id number]     Arrival date & time 05/02/14  2325 History  This chart was scribed for Dierdre Forth, PA-C working with Mancel Bale, MD by Evon Slack, ED Scribe. This patient was seen in room TR06C/TR06C and the patient's care was started at 11:38 PM.    Chief Complaint  Patient presents with  . Nasal Congestion  . Cough   Patient is a 54 y.o. male presenting with cough. The history is provided by the patient and medical records. No language interpreter was used.  Cough Associated symptoms: chest pain (brought on from coughing ), rhinorrhea and sore throat   Associated symptoms: no chills, no ear pain, no fever, no headaches, no myalgias, no rash, no shortness of breath and no wheezing    HPI Comments: Mike [name] is a 54 y.o. male with PMHx IDDM,anxiety and HTN who presents to the Emergency Department complaining of cough onset 3 days prior. Pt reports associated nasal congestion described as it being "hard to breath" but denies SOB. Pt reports bilateral rib pain brought on from coughing. Pt denies any medications PTA. Pt states that he is an everyday smoker. Pt states that he drinks alcohol occasionally. Pt denies ear pain, fever, chills, abdominal pain, nausea or vomiting. Pt states that his blood sugars are controlled with insulin. Pt states that his blood sugars usually run around 160 and the highest is 200.   Past Medical History  Diagnosis Date  . Diabetes mellitus   . Depression   . Anxiety   . Anxiety   . Hypertension   . Hyperlipemia    History reviewed. No pertinent past surgical history. No family history on file. History  Substance Use Topics  . Smoking status: Current Every Day Smoker -- 0.10 packs/day    Types: Cigarettes  . Smokeless tobacco: Not on file  . Alcohol Use: 7.2 oz/week    12 Cans of beer per week     Comment: occasional    Review of Systems  Constitutional: Negative for fever, chills, appetite change and fatigue.  HENT:  Positive for congestion, postnasal drip, rhinorrhea, sinus pressure and sore throat. Negative for ear discharge, ear pain and mouth sores.   Eyes: Negative for visual disturbance.  Respiratory: Positive for cough. Negative for chest tightness, shortness of breath, wheezing and stridor.   Cardiovascular: Positive for chest pain (brought on from coughing ). Negative for palpitations and leg swelling.  Gastrointestinal: Negative for nausea, vomiting, abdominal pain and diarrhea.  Genitourinary: Negative for dysuria, urgency, frequency and hematuria.  Musculoskeletal: Negative for myalgias, back pain, arthralgias and neck stiffness.  Skin: Negative for rash.  Neurological: Negative for syncope, light-headedness, numbness and headaches.  Hematological: Negative for adenopathy.  Psychiatric/Behavioral: The patient is not nervous/anxious.   All other systems reviewed and are negative.    Allergies  Ibuprofen; Ibuprofen; Tylenol; and Tylenol  Home Medications   Prior to Admission medications   Medication Sig Start Date End Date Taking? Authorizing Provider  clonazePAM (KLONOPIN) 1 MG tablet Take 1-2 mg by mouth 2 (two) times daily. Take 1 tablet in the morning and 2 tablets at bedtime    Historical Provider, MD  clonazePAM (KLONOPIN) 1 MG tablet Take 1 mg by mouth 2 (two) times daily.    Historical Provider, MD  hydrOXYzine (ATARAX/VISTARIL) 10 MG tablet Take 1 tablet (10 mg total) by mouth every 6 (six) hours as needed for anxiety. 04/25/14   Everlene Farrier, PA-C  insulin aspart (NOVOLOG) 100 UNIT/ML injection Inject 15  Units into the skin every morning.    Historical Provider, MD  insulin NPH (HUMULIN N,NOVOLIN N) 100 UNIT/ML injection Inject 20 Units into the skin daily before breakfast.     Historical Provider, MD  PRESCRIPTION MEDICATION Take 1 tablet by mouth daily. Blood pressure    Historical Provider, MD  tetrahydrozoline 0.05 % ophthalmic solution Place 1 drop into the left eye 2 (two)  times daily as needed. For eye irritation    Historical Provider, MD   BP 175/70 mmHg  Pulse 82  Temp(Src) 98.5 F (36.9 C) (Oral)  Resp 20  Ht  (1.93 m)  Wt 174 lb (78.926 kg)  BMI 21.19 kg/m2  SpO2 97%  Physical Exam  Constitutional: He is oriented to person, place, and time. He appears well-developed and well-nourished. No distress.  HENT:  Head: Normocephalic and atraumatic.  Right Ear: Tympanic membrane, external ear and ear canal normal.  Left Ear: Tympanic membrane, external ear and ear canal normal.  Nose: Mucosal edema and rhinorrhea present. No epistaxis. Right sinus exhibits no maxillary sinus tenderness and no frontal sinus tenderness. Left sinus exhibits no maxillary sinus tenderness and no frontal sinus tenderness.  Mouth/Throat: Uvula is midline, oropharynx is clear and moist and mucous membranes are normal. Mucous membranes are not pale and not cyanotic. No oropharyngeal exudate, posterior oropharyngeal edema, posterior oropharyngeal erythema or tonsillar abscesses.  Eyes: Conjunctivae are normal. Pupils are equal, round, and reactive to light.  Neck: Normal range of motion and full passive range of motion without pain.  Cardiovascular: Normal rate and intact distal pulses.   Pulmonary/Chest: Effort normal and breath sounds normal. No stridor.  Clear and equal breath sounds without focal wheezes, rhonchi, rales  Abdominal: Soft. Bowel sounds are normal. There is no tenderness.  Musculoskeletal: Normal range of motion.  Lymphadenopathy:    He has no cervical adenopathy.  Neurological: He is alert and oriented to person, place, and time.  Skin: Skin is warm and dry. No rash noted. He is not diaphoretic.  Psychiatric: He has a normal mood and affect.  Nursing note and vitals reviewed.   ED Course  Procedures (including critical care time) DIAGNOSTIC STUDIES: Oxygen Saturation is 97% on RA, normal by my interpretation.    COORDINATION OF CARE: 11:55  PM-Discussed treatment plan with pt at bedside and pt agreed to plan.     Labs Review Labs Reviewed  CBG MONITORING, ED - Abnormal; Notable for the following:    Glucose-Capillary 125 (*)    All other components within normal limits    Imaging Review No results found.   EKG Interpretation None      MDM   Final diagnoses:  Viral URI with cough    Mike [name] presents with URI symptoms x2 days.  Pt afebrile, nontachycardic and without hypoxia.  No focal breath sounds, doubt PNA; no x-ray indicated at this time.  Patients symptoms are consistent with URI, likely viral etiology. Discussed that antibiotics are not indicated for viral infections.  Pt with hx of IDDM - CBG 125 today. Pt will be discharged with symptomatic treatment.  Verbalizes understanding and is agreeable with plan. Pt is hemodynamically stable & in NAD prior to dc.  BP 175/70 mmHg  Pulse 82  Temp(Src) 98.5 F (36.9 C) (Oral)  Resp 20  Ht  (1.93 m)  Wt 174 lb (78.926 kg)  BMI 21.19 kg/m2  SpO2 97%   I personally performed the services described in this documentation, which was scribed  in my presence. The recorded information has been reviewed and is accurate.   Dahlia ClientHannah Dessie Tatem, PA-C 05/03/14 0021  Mancel BaleElliott Wentz, MD 05/03/14 [phone]

## 2014-05-02 NOTE — ED Notes (Signed)
Pt. reports productive cough , nasal congestion onset this week . Denies fever or chills. Respirations unlabored .

## 2014-05-03 LAB — CBG MONITORING, ED: Glucose-Capillary: 125 mg/dL — ABNORMAL HIGH (ref 70–99)

## 2014-05-03 MED ORDER — FLUTICASONE PROPIONATE 50 MCG/ACT NA SUSP: 2.0000 | Freq: Every day | NASAL | Status: DC

## 2014-05-03 MED ORDER — BENZONATATE 100 MG PO CAPS: 100.0000 mg | ORAL_CAPSULE | Freq: Three times a day (TID) | ORAL | Status: DC

## 2014-05-03 MED ORDER — GUAIFENESIN ER 600 MG PO TB12: 1200.0000 mg | ORAL_TABLET | Freq: Two times a day (BID) | ORAL | Status: DC

## 2014-05-03 MED ORDER — BENZONATATE 100 MG PO CAPS
200.0000 mg | ORAL_CAPSULE | Freq: Once | ORAL | Status: AC
  Administered 2014-05-03: 200 mg via ORAL
  Filled 2014-05-03: qty 2

## 2014-05-03 NOTE — Discharge Instructions (Signed)
1. Medications: flonase, mucinex, tessalon, usual home medications 2. Treatment: rest, drink plenty of fluids, take tylenol or ibuprofen for fever control 3. Follow Up: Please followup with your primary doctor in 3 days for discussion of your diagnoses and further evaluation after today's visit; if you do not have a primary care doctor use the resource guide provided to find one; Return to the ER for high fevers, difficulty breathing or other concerning symptoms    Cough, Adult  A cough is a reflex. It helps you clear your throat and airways. A cough can help heal your body. A cough can last 2 or 3 weeks (acute) or may last more than 8 weeks (chronic). Some common causes of a cough can include an infection, allergy, or a cold. HOME CARE  Only take medicine as told by your doctor.  If given, take your medicines (antibiotics) as told. Finish them even if you start to feel better.  Use a cold steam vaporizer or humidifier in your home. This can help loosen thick spit (secretions).  Sleep so you are almost sitting up (semi-upright). Use pillows to do this. This helps reduce coughing.  Rest as needed.  Stop smoking if you smoke. GET HELP RIGHT AWAY IF:  You have yellowish-white fluid (pus) in your thick spit.  Your cough gets worse.  Your medicine does not reduce coughing, and you are losing sleep.  You cough up blood.  You have trouble breathing.  Your pain gets worse and medicine does not help.  You have a fever. MAKE SURE YOU:   Understand these instructions.  Will watch your condition.  Will get help right away if you are not doing well or get worse. Document Released: 09/15/2010 Document Revised: 05/19/2013 Document Reviewed: 09/15/2010 [facility] Patient Information 2015 [place], [place]. This information is not intended to replace advice given to you by your health care provider. Make sure you discuss any questions you have with your health care provider.

## 2014-05-03 NOTE — ED Notes (Signed)
CBG 125 °

## 2014-05-17 ENCOUNTER — Encounter (HOSPITAL_COMMUNITY): Payer: Self-pay

## 2014-05-17 ENCOUNTER — Emergency Department (HOSPITAL_COMMUNITY)
Admission: EM | Admit: 2014-05-17 | Discharge: 2014-05-17 | Disposition: A | Discharge status: Home / Self Care | Payer: Self-pay | Attending: Emergency Medicine | Admitting: Emergency Medicine

## 2014-05-17 DIAGNOSIS — Z7951 Long term (current) use of inhaled steroids: Secondary | ICD-10-CM | POA: Insufficient documentation

## 2014-05-17 DIAGNOSIS — S50862A Insect bite (nonvenomous) of left forearm, initial encounter: Secondary | ICD-10-CM | POA: Insufficient documentation

## 2014-05-17 DIAGNOSIS — Z79899 Other long term (current) drug therapy: Secondary | ICD-10-CM | POA: Insufficient documentation

## 2014-05-17 DIAGNOSIS — F419 Anxiety disorder, unspecified: Secondary | ICD-10-CM | POA: Insufficient documentation

## 2014-05-17 DIAGNOSIS — E119 Type 2 diabetes mellitus without complications: Secondary | ICD-10-CM | POA: Insufficient documentation

## 2014-05-17 DIAGNOSIS — Y999 Unspecified external cause status: Secondary | ICD-10-CM | POA: Insufficient documentation

## 2014-05-17 DIAGNOSIS — W57XXXA Bitten or stung by nonvenomous insect and other nonvenomous arthropods, initial encounter: Secondary | ICD-10-CM | POA: Insufficient documentation

## 2014-05-17 DIAGNOSIS — Y939 Activity, unspecified: Secondary | ICD-10-CM | POA: Insufficient documentation

## 2014-05-17 DIAGNOSIS — Z794 Long term (current) use of insulin: Secondary | ICD-10-CM | POA: Insufficient documentation

## 2014-05-17 DIAGNOSIS — I10 Essential (primary) hypertension: Secondary | ICD-10-CM | POA: Insufficient documentation

## 2014-05-17 DIAGNOSIS — Y929 Unspecified place or not applicable: Secondary | ICD-10-CM | POA: Insufficient documentation

## 2014-05-17 DIAGNOSIS — Z720 Tobacco use: Secondary | ICD-10-CM | POA: Insufficient documentation

## 2014-05-17 NOTE — ED Notes (Signed)
Pt has multiple small bumps to left forearm. Pt states that they appeared last week. Pt states "I shouldn't have shared that blanket that I did".  Pt put tobacco on the bumps to "help get rid of them".

## 2014-05-17 NOTE — ED Notes (Signed)
Pt states that the bumps on his forearm are not painful, they do not burn, and they do not itch.

## 2014-05-17 NOTE — ED Provider Notes (Signed)
CSN: [id number]     Arrival date & time 05/17/14  0411 History   First MD Initiated Contact with Patient 05/17/14 [phone]     Chief Complaint  Patient presents with  . Rash     (Consider location/radiation/quality/duration/timing/severity/associated sxs/prior Treatment) HPI   Mike [name] is a 54 y.o. male with past medical history of hypertension, hyperlipidemia, diabetes presenting today with a rash on his left upper extremity. He states it has been there for the past week. He states he slept in his own bed and he believes he got it from his bed. He denies seeing any insects. The rash is not painful or pruritic. He denies any further spread. He's had no fevers or recent infections. Patient has no further complaints.  10 Systems reviewed and are negative for acute change except as noted in the HPI.    Past Medical History  Diagnosis Date  . Diabetes mellitus   . Depression   . Anxiety   . Anxiety   . Hypertension   . Hyperlipemia    History reviewed. No pertinent past surgical history. No family history on file. History  Substance Use Topics  . Smoking status: Current Every Day Smoker -- 0.10 packs/day    Types: Cigarettes  . Smokeless tobacco: Not on file  . Alcohol Use: 7.2 oz/week    12 Cans of beer per week     Comment: occasional    Review of Systems    Allergies  Ibuprofen; Ibuprofen; Tylenol; and Tylenol  Home Medications   Prior to Admission medications   Medication Sig Start Date End Date Taking? Authorizing Provider  benzonatate (TESSALON) 100 MG capsule Take 1 capsule (100 mg total) by mouth every 8 (eight) hours. 05/03/14   Hannah Muthersbaugh, PA-C  clonazePAM (KLONOPIN) 1 MG tablet Take 1-2 mg by mouth 2 (two) times daily. Take 1 tablet in the morning and 2 tablets at bedtime    Historical Provider, MD  clonazePAM (KLONOPIN) 1 MG tablet Take 1 mg by mouth 2 (two) times daily.    Historical Provider, MD  fluticasone (FLONASE) 50 MCG/ACT nasal spray  Place 2 sprays into both nostrils daily. 05/03/14   Hannah Muthersbaugh, PA-C  guaiFENesin (MUCINEX) 600 MG 12 hr tablet Take 2 tablets (1,200 mg total) by mouth 2 (two) times daily. 05/03/14   Hannah Muthersbaugh, PA-C  hydrOXYzine (ATARAX/VISTARIL) 10 MG tablet Take 1 tablet (10 mg total) by mouth every 6 (six) hours as needed for anxiety. 04/25/14   Everlene Farrier, PA-C  insulin aspart (NOVOLOG) 100 UNIT/ML injection Inject 15 Units into the skin every morning.    Historical Provider, MD  insulin NPH (HUMULIN N,NOVOLIN N) 100 UNIT/ML injection Inject 20 Units into the skin daily before breakfast.     Historical Provider, MD  PRESCRIPTION MEDICATION Take 1 tablet by mouth daily. Blood pressure    Historical Provider, MD  tetrahydrozoline 0.05 % ophthalmic solution Place 1 drop into the left eye 2 (two) times daily as needed. For eye irritation    Historical Provider, MD   BP 133/71 mmHg  Pulse 64  Temp(Src) 97.8 F (36.6 C) (Oral)  Resp 17  Ht  (1.93 m)  Wt 170 lb (77.111 kg)  BMI 20.70 kg/m2  SpO2 98% Physical Exam  Constitutional: Vital signs are normal. He appears well-developed and well-nourished.  Non-toxic appearance. He does not appear ill. No distress.  HENT:  Head: Normocephalic and atraumatic.  Nose: Nose normal.  Mouth/Throat: Oropharynx is clear and moist. No  oropharyngeal exudate.  Eyes: Conjunctivae and EOM are normal. Pupils are equal, round, and reactive to light. No scleral icterus.  Neck: Normal range of motion. Neck supple. No tracheal deviation, no edema, no erythema and normal range of motion present. No thyroid mass and no thyromegaly present.  Cardiovascular: Normal rate, regular rhythm, S1 normal, S2 normal, normal heart sounds, intact distal pulses and normal pulses.  Exam reveals no gallop and no friction rub.   No murmur heard. Pulses:      Radial pulses are 2+ on the right side, and 2+ on the left side.       Dorsalis pedis pulses are 2+ on the right side,  and 2+ on the left side.  Pulmonary/Chest: Effort normal and breath sounds normal. No respiratory distress. He has no wheezes. He has no rhonchi. He has no rales.  Abdominal: Soft. Normal appearance and bowel sounds are normal. He exhibits no distension, no ascites and no mass. There is no hepatosplenomegaly. There is no tenderness. There is no rebound, no guarding and no CVA tenderness.  Musculoskeletal: Normal range of motion. He exhibits no edema or tenderness.  Lymphadenopathy:    He has no cervical adenopathy.  Neurological: He is alert. He has normal strength. No sensory deficit.  Skin: Skin is warm, dry and intact. Rash noted. No petechiae noted. He is not diaphoretic. No erythema. No pallor.  Small lesions seen in the left upper extremity consistent with insect bites.  Psychiatric: He has a normal mood and affect. His behavior is normal. Judgment normal.  Nursing note and vitals reviewed.   ED Course  Procedures (including critical care time) Labs Review Labs Reviewed - No data to display  Imaging Review No results found.   EKG Interpretation None      MDM   Final diagnoses:  Bug bites    Patient presents emergency department for rashes left upper extremity. It appears to be multiple small lesions consistent with insect bites or bug bites. He is educated on this diagnosis. No medications are warranted. He is advised to change her he is sleeping or wash his sheets thoroughly. Patient demonstrated a status plan. His vital signs were within his normal limits and he is safe for discharge.    Tomasita CrumbleAdeleke Camarion Weier, MD 05/17/14 [phone]

## 2014-05-17 NOTE — ED Notes (Signed)
The pt has been in the waiting room all night and just  Checked in.  He refuses to give his name or tell me why hes here.  Red eyes.  Unco-operative.  willnot be triaged until he can be placed into a room with more staff available.  When asked why he was here he did not answer then said ask them   Looking toward  registration

## 2014-05-17 NOTE — Discharge Instructions (Signed)
Bedbugs Mr. Vita ErmGenius, your rash is consistent with bed bugs.  Wash your sheets or try sleeping someone where else.  See your primary doctor within 3 days for close follow up.  If symptoms worsen come back to emergency department immediately. Thank you. Bedbugs are tiny bugs that live in and around beds. They come out at night and bite people lying in bed. Bedbug bites rarely cause a medical problem. The bites do cause red, itchy bumps. HOME CARE  Only take medicine as told by your doctor.  Wear pajamas with long sleeves and pant legs.  Call a pest control expert. You may need to throw away your mattress. Ask the pest control expert what you can do to keep the bedbugs from coming back. You may need to:  Put a plastic cover over your mattress.  Wash your clothes and bedding in hot water. Dry them in a hot dryer. The temperature should be hotter than 120 F (48.9 C).  Vacuum all around your bed often.  Check all used furniture, bedding, or clothes for bedbugs before you bring them into your house.  Remove bird nests and bat perches around your home.  After you travel, check your clothes and luggage for bedbugs before you bring them into your house. If you find any bedbugs, throw those items away. GET HELP RIGHT AWAY IF:  You have a fever.  You have red bug bites that keep coming back.  You have red bug bites that itch badly.  You have bug bites that cause a skin rash.  You have scratch marks that are red and sore. MAKE SURE YOU:  Understand these instructions.  Will watch your condition.  Will get help right away if you are not doing well or get worse. Document Released: 04/19/2010 Document Revised: 03/27/2011 Document Reviewed: 04/19/2010 [facility] Patient Information 2015 [place], [place]. This information is not intended to replace advice given to you by your health care provider. Make sure you discuss any questions you have with your health care provider.
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
## 2014-06-03 ENCOUNTER — Emergency Department (HOSPITAL_COMMUNITY)
Admission: EM | Admit: 2014-06-03 | Discharge: 2014-06-03 | Disposition: A | Discharge status: Home / Self Care | Payer: Self-pay | Attending: Emergency Medicine | Admitting: Emergency Medicine

## 2014-06-03 ENCOUNTER — Encounter (HOSPITAL_COMMUNITY): Payer: Self-pay | Admitting: Emergency Medicine

## 2014-06-03 DIAGNOSIS — Z7951 Long term (current) use of inhaled steroids: Secondary | ICD-10-CM | POA: Insufficient documentation

## 2014-06-03 DIAGNOSIS — Z79899 Other long term (current) drug therapy: Secondary | ICD-10-CM | POA: Insufficient documentation

## 2014-06-03 DIAGNOSIS — Z794 Long term (current) use of insulin: Secondary | ICD-10-CM | POA: Insufficient documentation

## 2014-06-03 DIAGNOSIS — Z720 Tobacco use: Secondary | ICD-10-CM | POA: Insufficient documentation

## 2014-06-03 DIAGNOSIS — F419 Anxiety disorder, unspecified: Secondary | ICD-10-CM | POA: Insufficient documentation

## 2014-06-03 DIAGNOSIS — E119 Type 2 diabetes mellitus without complications: Secondary | ICD-10-CM | POA: Insufficient documentation

## 2014-06-03 DIAGNOSIS — F329 Major depressive disorder, single episode, unspecified: Secondary | ICD-10-CM | POA: Insufficient documentation

## 2014-06-03 DIAGNOSIS — I10 Essential (primary) hypertension: Secondary | ICD-10-CM | POA: Insufficient documentation

## 2014-06-03 LAB — CBC WITH DIFFERENTIAL/PLATELET
Basophils Absolute: 0 10*3/uL (ref 0.0–0.1)
Basophils Relative: 1 % (ref 0–1)
Eosinophils Absolute: 0 10*3/uL (ref 0.0–0.7)
Eosinophils Relative: 0 % (ref 0–5)
HEMATOCRIT: 40 % (ref 39.0–52.0)
Hemoglobin: 13.1 g/dL (ref 13.0–17.0)
LYMPHS ABS: 1.4 10*3/uL (ref 0.7–4.0)
Lymphocytes Relative: 27 % (ref 12–46)
MCH: 26.4 pg (ref 26.0–34.0)
MCHC: 32.8 g/dL (ref 30.0–36.0)
MCV: 80.5 fL (ref 78.0–100.0)
MONOS PCT: 8 % (ref 3–12)
Monocytes Absolute: 0.4 10*3/uL (ref 0.1–1.0)
NEUTROS ABS: 3.2 10*3/uL (ref 1.7–7.7)
Neutrophils Relative %: 64 % (ref 43–77)
Platelets: 267 10*3/uL (ref 150–400)
RBC: 4.97 MIL/uL (ref 4.22–5.81)
RDW: 12.9 % (ref 11.5–15.5)
WBC: 5 10*3/uL (ref 4.0–10.5)

## 2014-06-03 LAB — COMPREHENSIVE METABOLIC PANEL
ALBUMIN: 4 g/dL (ref 3.5–5.0)
ALK PHOS: 60 U/L (ref 38–126)
ALT: 18 U/L (ref 17–63)
AST: 22 U/L (ref 15–41)
Anion gap: 15 (ref 5–15)
BUN: 11 mg/dL (ref 6–20)
CALCIUM: 9.6 mg/dL (ref 8.9–10.3)
CO2: 24 mmol/L (ref 22–32)
Chloride: 97 mmol/L — ABNORMAL LOW (ref 101–111)
Creatinine, Ser: 0.94 mg/dL (ref 0.61–1.24)
GFR calc Af Amer: >60 mL/min (ref >60)
GFR calc non Af Amer: >60 mL/min (ref >60)
Glucose, Bld: 312 mg/dL — ABNORMAL HIGH (ref 65–99)
POTASSIUM: 4.2 mmol/L (ref 3.5–5.1)
SODIUM: 136 mmol/L (ref 135–145)
Total Bilirubin: 1.3 mg/dL — ABNORMAL HIGH (ref 0.3–1.2)
Total Protein: 7.1 g/dL (ref 6.5–8.1)

## 2014-06-03 NOTE — ED Notes (Signed)
Pt sts out of Klonipn that he takes for anxiety; pt s ts taking htn meds but noted to be hypertensive

## 2014-06-03 NOTE — Discharge Instructions (Signed)
[facility]. Your blood sugar today was elevated at 312. Your blood pressure should be rechecked within the next week. Today's was elevated at 189/99

## 2014-06-03 NOTE — ED Provider Notes (Signed)
CSN: [id number]     Arrival date & time 06/03/14  1336 History   First MD Initiated Contact with Patient 06/03/14 1437     Chief Complaint  Patient presents with  . Medication Refill  . Hypertension     (Consider location/radiation/quality/duration/timing/severity/associated sxs/prior Treatment) HPI Patient requesting medication for anxiety medicine. Ran out of his Klonopin 9 days ago. He complains of anxiety. No other associated symptoms here. He states he's gone to [facility], to the walk-in clinic but did not wait to get Klonopin refilled. No other associated symptoms. No thought of harming self or others. No treatment prior to coming here. He states he took his antihypertensive medication and diabetic medications today. Past Medical History  Diagnosis Date  . Diabetes mellitus   . Depression   . Anxiety   . Anxiety   . Hypertension   . Hyperlipemia    History reviewed. No pertinent past surgical history. History reviewed. No pertinent family history. History  Substance Use Topics  . Smoking status: Current Every Day Smoker -- 0.10 packs/day    Types: Cigarettes  . Smokeless tobacco: Not on file  . Alcohol Use: 7.2 oz/week    12 Cans of beer per week     Comment: occasional   no drug use  Review of Systems  Constitutional: Negative.   HENT: Negative.   Respiratory: Negative.   Cardiovascular: Negative.   Gastrointestinal: Negative.   Musculoskeletal: Negative.   Skin: Negative.   Neurological: Negative.   Psychiatric/Behavioral:       Anxiety      Allergies  Ibuprofen; Ibuprofen; Tylenol; and Tylenol  Home Medications   Prior to Admission medications   Medication Sig Start Date End Date Taking? Authorizing Provider  benzonatate (TESSALON) 100 MG capsule Take 1 capsule (100 mg total) by mouth every 8 (eight) hours. 05/03/14   Hannah Muthersbaugh, PA-C  clonazePAM (KLONOPIN) 1 MG tablet Take 1-2 mg by mouth 2 (two) times daily. Take 1 tablet in the morning and 2  tablets at bedtime    Historical Provider, MD  clonazePAM (KLONOPIN) 1 MG tablet Take 1 mg by mouth 2 (two) times daily.    Historical Provider, MD  fluticasone (FLONASE) 50 MCG/ACT nasal spray Place 2 sprays into both nostrils daily. 05/03/14   Hannah Muthersbaugh, PA-C  guaiFENesin (MUCINEX) 600 MG 12 hr tablet Take 2 tablets (1,200 mg total) by mouth 2 (two) times daily. 05/03/14   Hannah Muthersbaugh, PA-C  hydrOXYzine (ATARAX/VISTARIL) 10 MG tablet Take 1 tablet (10 mg total) by mouth every 6 (six) hours as needed for anxiety. 04/25/14   Everlene FarrierWilliam Dansie, PA-C  insulin aspart (NOVOLOG) 100 UNIT/ML injection Inject 15 Units into the skin every morning.    Historical Provider, MD  insulin NPH (HUMULIN N,NOVOLIN N) 100 UNIT/ML injection Inject 20 Units into the skin daily before breakfast.     Historical Provider, MD  PRESCRIPTION MEDICATION Take 1 tablet by mouth daily. Blood pressure    Historical Provider, MD  tetrahydrozoline 0.05 % ophthalmic solution Place 1 drop into the left eye 2 (two) times daily as needed. For eye irritation    Historical Provider, MD   BP 197/89 mmHg  Pulse 67  Temp(Src) 98.3 F (36.8 C) (Oral)  Resp 14  SpO2 99% Physical Exam  Constitutional: He appears well-developed and well-nourished. No distress.  HENT:  Head: Normocephalic and atraumatic.  Eyes: Conjunctivae are normal. Pupils are equal, round, and reactive to light.  Neck: Neck supple. No tracheal deviation present. No  thyromegaly present.  Cardiovascular: Normal rate and regular rhythm.   No murmur heard. Pulmonary/Chest: Effort normal and breath sounds normal.  Abdominal: Soft. Bowel sounds are normal. He exhibits no distension. There is no tenderness.  Musculoskeletal: Normal range of motion. He exhibits no edema or tenderness.  Neurological: He is alert. Coordination normal.  Skin: Skin is warm and dry. No rash noted.  Psychiatric: He has a normal mood and affect.  Nursing note and vitals  reviewed.   ED Course  Procedures (including critical care time) Labs Review Labs Reviewed  COMPREHENSIVE METABOLIC PANEL - Abnormal; Notable for the following:    Chloride 97 (*)    Glucose, Bld 312 (*)    Total Bilirubin 1.3 (*)    All other components within normal limits  CBC WITH DIFFERENTIAL/PLATELET  URINALYSIS, ROUTINE W REFLEX MICROSCOPIC    Imaging Review No results found.   EKG Interpretation None      Results for orders placed or performed during the hospital encounter of 06/03/14  CBC with Differential  Result Value Ref Range   WBC 5.0 4.0 - 10.5 K/uL   RBC 4.97 4.22 - 5.81 MIL/uL   Hemoglobin 13.1 13.0 - 17.0 g/dL   HCT 78.240.0 95.639.0 - 21.352.0 %   MCV 80.5 78.0 - 100.0 fL   MCH 26.4 26.0 - 34.0 pg   MCHC 32.8 30.0 - 36.0 g/dL   RDW 08.612.9 57.811.5 - 46.915.5 %   Platelets 267 150 - 400 K/uL   Neutrophils Relative % 64 43 - 77 %   Neutro Abs 3.2 1.7 - 7.7 K/uL   Lymphocytes Relative 27 12 - 46 %   Lymphs Abs 1.4 0.7 - 4.0 K/uL   Monocytes Relative 8 3 - 12 %   Monocytes Absolute 0.4 0.1 - 1.0 K/uL   Eosinophils Relative 0 0 - 5 %   Eosinophils Absolute 0.0 0.0 - 0.7 K/uL   Basophils Relative 1 0 - 1 %   Basophils Absolute 0.0 0.0 - 0.1 K/uL  Comprehensive metabolic panel  Result Value Ref Range   Sodium 136 135 - 145 mmol/L   Potassium 4.2 3.5 - 5.1 mmol/L   Chloride 97 (L) 101 - 111 mmol/L   CO2 24 22 - 32 mmol/L   Glucose, Bld 312 (H) 65 - 99 mg/dL   BUN 11 6 - 20 mg/dL   Creatinine, Ser 6.290.94 0.61 - 1.24 mg/dL   Calcium 9.6 8.9 - 52.810.3 mg/dL   Total Protein 7.1 6.5 - 8.1 g/dL   Albumin 4.0 3.5 - 5.0 g/dL   AST 22 15 - 41 U/L   ALT 18 17 - 63 U/L   Alkaline Phosphatase 60 38 - 126 U/L   Total Bilirubin 1.3 (H) 0.3 - 1.2 mg/dL   GFR calc non Af Amer >60 >60 mL/min   GFR calc Af Amer >60 >60 mL/min   Anion gap 15 5 - 15   No results found.   MDM  Isn't exhibiting no signs of withdrawal. I explained to him that he needs to go to [facility] for [facility] medication  refills for Klonopin. He did state that his primary care physician will not refill Klonopin. Final diagnoses:  None   he has been informed of his elevated blood sugar. Advised to get blood pressure recheck one week Diagnosis #1 chronic anxiety #2 hyperglycemia #3 hypertension      Doug SouSam Mayana Irigoyen, MD 06/03/14 1545

## 2014-10-27 ENCOUNTER — Encounter (HOSPITAL_COMMUNITY): Payer: Self-pay | Admitting: Emergency Medicine

## 2014-10-27 ENCOUNTER — Emergency Department (HOSPITAL_COMMUNITY)
Admission: EM | Admit: 2014-10-27 | Discharge: 2014-10-27 | Disposition: A | Discharge status: Home / Self Care | Payer: Self-pay | Attending: Emergency Medicine | Admitting: Emergency Medicine

## 2014-10-27 DIAGNOSIS — R739 Hyperglycemia, unspecified: Secondary | ICD-10-CM

## 2014-10-27 DIAGNOSIS — Z720 Tobacco use: Secondary | ICD-10-CM | POA: Insufficient documentation

## 2014-10-27 DIAGNOSIS — R358 Other polyuria: Secondary | ICD-10-CM | POA: Insufficient documentation

## 2014-10-27 DIAGNOSIS — Z8659 Personal history of other mental and behavioral disorders: Secondary | ICD-10-CM | POA: Insufficient documentation

## 2014-10-27 DIAGNOSIS — I10 Essential (primary) hypertension: Secondary | ICD-10-CM | POA: Insufficient documentation

## 2014-10-27 DIAGNOSIS — E1165 Type 2 diabetes mellitus with hyperglycemia: Secondary | ICD-10-CM | POA: Insufficient documentation

## 2014-10-27 DIAGNOSIS — Z8639 Personal history of other endocrine, nutritional and metabolic disease: Secondary | ICD-10-CM | POA: Insufficient documentation

## 2014-10-27 LAB — CBC WITH DIFFERENTIAL/PLATELET
BASOS ABS: 0 10*3/uL (ref 0.0–0.1)
Basophils Relative: 1 %
EOS ABS: 0.2 10*3/uL (ref 0.0–0.7)
EOS PCT: 4 %
HCT: 36.8 % — ABNORMAL LOW (ref 39.0–52.0)
HEMOGLOBIN: 11.9 g/dL — AB (ref 13.0–17.0)
Lymphocytes Relative: 43 %
Lymphs Abs: 2.2 10*3/uL (ref 0.7–4.0)
MCH: 26.3 pg (ref 26.0–34.0)
MCHC: 32.3 g/dL (ref 30.0–36.0)
MCV: 81.2 fL (ref 78.0–100.0)
Monocytes Absolute: 0.5 10*3/uL (ref 0.1–1.0)
Monocytes Relative: 11 %
Neutro Abs: 2.2 10*3/uL (ref 1.7–7.7)
Neutrophils Relative %: 42 %
PLATELETS: 234 10*3/uL (ref 150–400)
RBC: 4.53 MIL/uL (ref 4.22–5.81)
RDW: 12.4 % (ref 11.5–15.5)
WBC: 5.1 10*3/uL (ref 4.0–10.5)

## 2014-10-27 LAB — COMPREHENSIVE METABOLIC PANEL
ALBUMIN: 3.7 g/dL (ref 3.5–5.0)
ALK PHOS: 62 U/L (ref 38–126)
ALT: 12 U/L — AB (ref 17–63)
AST: 22 U/L (ref 15–41)
Anion gap: 8 (ref 5–15)
BUN: 18 mg/dL (ref 6–20)
CALCIUM: 9.4 mg/dL (ref 8.9–10.3)
CHLORIDE: 96 mmol/L — AB (ref 101–111)
CO2: 28 mmol/L (ref 22–32)
Creatinine, Ser: 0.92 mg/dL (ref 0.61–1.24)
GFR calc non Af Amer: >60 mL/min (ref >60)
GLUCOSE: 415 mg/dL — AB (ref 65–99)
Potassium: 3.9 mmol/L (ref 3.5–5.1)
SODIUM: 132 mmol/L — AB (ref 135–145)
Total Bilirubin: 0.6 mg/dL (ref 0.3–1.2)
Total Protein: 6.8 g/dL (ref 6.5–8.1)

## 2014-10-27 LAB — CBG MONITORING, ED
GLUCOSE-CAPILLARY: 332 mg/dL — AB (ref 65–99)
Glucose-Capillary: 437 mg/dL — ABNORMAL HIGH (ref 65–99)

## 2014-10-27 MED ORDER — SODIUM CHLORIDE 0.9 % IV BOLUS (SEPSIS)
1000.0000 mL | Freq: Once | INTRAVENOUS | Status: AC
Start: 2014-10-27 — End: 2014-10-27
  Administered 2014-10-27: 1000 mL via INTRAVENOUS

## 2014-10-27 MED ORDER — INSULIN ASPART 100 UNIT/ML ~~LOC~~ SOLN
10.0000 [IU] | Freq: Once | SUBCUTANEOUS | Status: AC
  Administered 2014-10-27: 10 [IU] via SUBCUTANEOUS
  Filled 2014-10-27: qty 1

## 2014-10-27 MED ORDER — SODIUM CHLORIDE 0.9 % IV BOLUS (SEPSIS)
1000.0000 mL | Freq: Once | INTRAVENOUS | Status: AC
  Administered 2014-10-27: 1000 mL via INTRAVENOUS

## 2014-10-27 NOTE — ED Notes (Signed)
Pt. reports dizziness and fell x2 today , alert and oriented at arrival , speech clear ,  No facial asymmetry , equal strong grips with no arm drift. Denies injury , respirations unlabored.

## 2014-10-27 NOTE — Discharge Instructions (Signed)
Hyperglycemia °Hyperglycemia occurs when the glucose (sugar) in your blood is too high. Hyperglycemia can happen for many reasons, but it most often happens to people who do not know they have diabetes or are not managing their diabetes properly.  °CAUSES  °Whether you have diabetes or not, there are other causes of hyperglycemia. Hyperglycemia can occur when you have diabetes, but it can also occur in other situations that you might not be as aware of, such as: °Diabetes °· If you have diabetes and are having problems controlling your blood glucose, hyperglycemia could occur because of some of the following reasons: °· Not following your meal plan. °· Not taking your diabetes medications or not taking it properly. °· Exercising less or doing less activity than you normally do. °· Being sick. °Pre-diabetes °· This cannot be ignored. Before people develop Type 2 diabetes, they almost always have "pre-diabetes." This is when your blood glucose levels are higher than normal, but not yet high enough to be diagnosed as diabetes. Research has shown that some long-term damage to the body, especially the heart and circulatory system, may already be occurring during pre-diabetes. If you take action to manage your blood glucose when you have pre-diabetes, you may delay or prevent Type 2 diabetes from developing. °Stress °· If you have diabetes, you may be "diet" controlled or on oral medications or insulin to control your diabetes. However, you may find that your blood glucose is higher than usual in the hospital whether you have diabetes or not. This is often referred to as "stress hyperglycemia." Stress can elevate your blood glucose. This happens because of hormones put out by the body during times of stress. If stress has been the cause of your high blood glucose, it can be followed regularly by your caregiver. That way he/she can make sure your hyperglycemia does not continue to get worse or progress to  diabetes. °Steroids °· Steroids are medications that act on the infection fighting system (immune system) to block inflammation or infection. One side effect can be a rise in blood glucose. Most people can produce enough extra insulin to allow for this rise, but for those who cannot, steroids make blood glucose levels go even higher. It is not unusual for steroid treatments to "uncover" diabetes that is developing. It is not always possible to determine if the hyperglycemia will go away after the steroids are stopped. A special blood test called an A1c is sometimes done to determine if your blood glucose was elevated before the steroids were started. °SYMPTOMS °· Thirsty. °· Frequent urination. °· Dry mouth. °· Blurred vision. °· Tired or fatigue. °· Weakness. °· Sleepy. °· Tingling in feet or leg. °DIAGNOSIS  °Diagnosis is made by monitoring blood glucose in one or all of the following ways: °· A1c test. This is a chemical found in your blood. °· Fingerstick blood glucose monitoring. °· Laboratory results. °TREATMENT  °First, knowing the cause of the hyperglycemia is important before the hyperglycemia can be treated. Treatment may include, but is not be limited to: °· Education. °· Change or adjustment in medications. °· Change or adjustment in meal plan. °· Treatment for an illness, infection, etc. °· More frequent blood glucose monitoring. °· Change in exercise plan. °· Decreasing or stopping steroids. °· Lifestyle changes. °HOME CARE INSTRUCTIONS  °· Test your blood glucose as directed. °· Exercise regularly. Your caregiver will give you instructions about exercise. Pre-diabetes or diabetes which comes on with stress is helped by exercising. °· Eat wholesome,   balanced meals. Eat often and at regular, fixed times. Your caregiver or nutritionist will give you a meal plan to guide your sugar intake.  Being at an ideal weight is important. If needed, losing as little as 10 to 15 pounds may help improve blood  glucose levels. SEEK MEDICAL CARE IF:   You have questions about medicine, activity, or diet.  You continue to have symptoms (problems such as increased thirst, urination, or weight gain). SEEK IMMEDIATE MEDICAL CARE IF:   You are vomiting or have diarrhea.  Your breath smells fruity.  You are breathing faster or slower.  You are very sleepy or incoherent.  You have numbness, tingling, or pain in your feet or hands.  You have chest pain.  Your symptoms get worse even though you have been following your caregiver's orders.  If you have any other questions or concerns.   This information is not intended to replace advice given to you by your health care provider. Make sure you discuss any questions you have with your health care provider.   Document Released: 06/28/2000 Document Revised: 03/27/2011 Document Reviewed: 09/08/2014 Elsevier Interactive Patient Education 2016 [employer] Dizziness Dizziness is a common problem. It is a feeling of unsteadiness or light-headedness. You may feel like you are about to faint. Dizziness can lead to injury if you stumble or fall. Anyone can become dizzy, but dizziness is more common in older adults. This condition can be caused by a number of things, including medicines, dehydration, or illness. HOME CARE INSTRUCTIONS Taking these steps may help with your condition: Eating and Drinking  Drink enough fluid to keep your urine clear or pale yellow. This helps to keep you from becoming dehydrated. Try to drink more clear fluids, such as water.  Do not drink alcohol.  Limit your caffeine intake if directed by your health care provider.  Limit your salt intake if directed by your health care provider. Activity  Avoid making quick movements.  Rise slowly from chairs and steady yourself until you feel okay.  In the morning, first sit up on the side of the bed. When you feel okay, stand slowly while you hold onto something until you know  that your balance is fine.  Move your legs often if you need to stand in one place for a long time. Tighten and relax your muscles in your legs while you are standing.  Do not drive or operate heavy machinery if you feel dizzy.  Avoid bending down if you feel dizzy. Place items in your home so that they are easy for you to reach without leaning over. Lifestyle  Do not use any tobacco products, including cigarettes, chewing tobacco, or electronic cigarettes. If you need help quitting, ask your health care provider.  Try to reduce your stress level, such as with yoga or meditation. Talk with your health care provider if you need help. General Instructions  Watch your dizziness for any changes.  Take medicines only as directed by your health care provider. Talk with your health care provider if you think that your dizziness is caused by a medicine that you are taking.  Tell a friend or a family member that you are feeling dizzy. If he or she notices any changes in your behavior, have this person call your health care provider.  Keep all follow-up visits as directed by your health care provider. This is important. SEEK MEDICAL CARE IF:  Your dizziness does not go away.  Your dizziness or light-headedness gets  worse.  You feel nauseous.  You have reduced hearing.  You have new symptoms.  You are unsteady on your feet or you feel like the room is spinning. SEEK IMMEDIATE MEDICAL CARE IF:  You vomit or have diarrhea and are unable to eat or drink anything.  You have problems talking, walking, swallowing, or using your arms, hands, or legs.  You feel generally weak.  You are not thinking clearly or you have trouble forming sentences. It may take a friend or family member to notice this.  You have chest pain, abdominal pain, shortness of breath, or sweating.  Your vision changes.  You notice any bleeding.  You have a headache.  You have neck pain or a stiff neck.  You  have a fever.   This information is not intended to replace advice given to you by your health care provider. Make sure you discuss any questions you have with your health care provider.   Document Released: 06/28/2000 Document Revised: 05/19/2014 Document Reviewed: 12/29/2013 Elsevier Interactive Patient Education [employer]

## 2014-10-27 NOTE — ED Provider Notes (Signed)
TIME SEEN: 0300  CHIEF COMPLAINT: Lightheadedness  HPI: Pt is a 54 y.o. male with history of diabetes, hypertension, hyperlipidemia who presents emergency department with lightheadedness, polyuria for the past several days. Reports that his lightheadedness has caused him to fall twice today but he did not hit his head or pass out. Did not obtain any injury from his falls. Denies any chest pain or shortness of breath. No numbness, tingling or focal weakness. No vomiting or diarrhea.  ROS: See HPI Constitutional: no fever  Eyes: no drainage  ENT: no runny nose   Cardiovascular:  no chest pain  Resp: no SOB  GI: no vomiting GU: no dysuria Integumentary: no rash  Allergy: no hives  Musculoskeletal: no leg swelling  Neurological: no slurred speech ROS otherwise negative  PAST MEDICAL HISTORY/PAST SURGICAL HISTORY:  Past Medical History  Diagnosis Date  . Diabetes mellitus   . Depression   . Anxiety   . Anxiety   . Hypertension   . Hyperlipemia     MEDICATIONS:  Prior to Admission medications   Medication Sig Start Date End Date Taking? Authorizing Provider  benzonatate (TESSALON) 100 MG capsule Take 1 capsule (100 mg total) by mouth every 8 (eight) hours. Patient not taking: Reported on 10/27/2014 05/03/14   Dahlia Client Muthersbaugh, PA-C  fluticasone (FLONASE) 50 MCG/ACT nasal spray Place 2 sprays into both nostrils daily. Patient not taking: Reported on 10/27/2014 05/03/14   Dahlia Client Muthersbaugh, PA-C  guaiFENesin (MUCINEX) 600 MG 12 hr tablet Take 2 tablets (1,200 mg total) by mouth 2 (two) times daily. Patient not taking: Reported on 10/27/2014 05/03/14   Dahlia Client Muthersbaugh, PA-C  hydrOXYzine (ATARAX/VISTARIL) 10 MG tablet Take 1 tablet (10 mg total) by mouth every 6 (six) hours as needed for anxiety. Patient not taking: Reported on 10/27/2014 04/25/14   Everlene Farrier, PA-C    ALLERGIES:  Allergies  Allergen Reactions  . Ibuprofen Other (See Comments)    "bloating"  . Ibuprofen  Nausea And Vomiting  . Tylenol [Acetaminophen] Other (See Comments)    bloating  . Tylenol [Acetaminophen] Nausea And Vomiting    SOCIAL HISTORY:  Social History  Substance Use Topics  . Smoking status: Current Every Day Smoker -- 0.10 packs/day    Types: Cigarettes  . Smokeless tobacco: Not on file  . Alcohol Use: Yes    FAMILY HISTORY: No family history on file.  EXAM: BP 162/84 mmHg  Pulse 61  Temp(Src) 97.6 F (36.4 C) (Oral)  Resp 18  Ht  (1.93 m)  Wt 164 lb 6 oz (74.56 kg)  BMI 20.02 kg/m2  SpO2 98% CONSTITUTIONAL: Alert and oriented and responds appropriately to questions. Well-appearing; well-nourished HEAD: Normocephalic EYES: Conjunctivae clear, PERRL ENT: normal nose; no rhinorrhea; slightly dry mucous membranes; pharynx without lesions noted NECK: Supple, no meningismus, no LAD  CARD: RRR; S1 and S2 appreciated; no murmurs, no clicks, no rubs, no gallops RESP: Normal chest excursion without splinting or tachypnea; breath sounds clear and equal bilaterally; no wheezes, no rhonchi, no rales, no hypoxia or respiratory distress, speaking full sentences ABD/GI: Normal bowel sounds; non-distended; soft, non-tender, no rebound, no guarding, no peritoneal signs BACK:  The back appears normal and is non-tender to palpation, there is no CVA tenderness EXT: Normal ROM in all joints; non-tender to palpation; no edema; normal capillary refill; no cyanosis, no calf tenderness or swelling    SKIN: Normal color for age and race; warm NEURO: Moves all extremities equally, sensation to light touch intact diffusely, cranial  nerves II through XII intact PSYCH: The patient's mood and manner are appropriate. Grooming and personal hygiene are appropriate.  MEDICAL DECISION MAKING: Patient here with lightheadedness. He appears slightly dehydrated on exam. Patient likely dehydrated from hyperglycemia. No signs of DKA. We'll give IV fluids and recheck blood glucose. We'll check  orthostatic vital signs. EKG shows no ischemic changes. He has early repolarization which is unchanged compared to prior EKGs.  ED PROGRESS: Blood glucose improving after IV hydration. He reports he is feeling better. Orthostats negative. He is able to ambulate without difficulty. I feel he is safe to be discharged home. Have recommended close outpatient follow-up. Discussed return precautions. He verbalized understanding and is comfortable with this plan.    EKG Interpretation  Date/Time:  Tuesday October 27 2014 03:19:27 EDT Ventricular Rate:  66 PR Interval:  150 QRS Duration: 93 QT Interval:  424 QTC Calculation: 444 R Axis:   84 Text Interpretation:  Sinus rhythm Consider left ventricular hypertrophy ST elev, probable normal early repol pattern No significant change since last tracing Confirmed by Anzel Kearse,  DO, Anderson Coppock [phone]) on 10/27/2014 4:34:55 AM        Layla Maw Aarini Slee, DO 10/28/14 1914

## 2014-12-23 ENCOUNTER — Inpatient Hospital Stay (HOSPITAL_COMMUNITY)
Admission: EM | Admit: 2014-12-23 | Discharge: 2014-12-26 | DRG: 204 | Disposition: A | Discharge status: Home / Self Care | Payer: Self-pay | Attending: Internal Medicine | Admitting: Internal Medicine

## 2014-12-23 ENCOUNTER — Encounter (HOSPITAL_COMMUNITY): Payer: Self-pay | Admitting: Emergency Medicine

## 2014-12-23 ENCOUNTER — Emergency Department (HOSPITAL_COMMUNITY): Payer: Self-pay

## 2014-12-23 DIAGNOSIS — R05 Cough: Secondary | ICD-10-CM

## 2014-12-23 DIAGNOSIS — Z681 Body mass index (BMI) 19 or less, adult: Secondary | ICD-10-CM

## 2014-12-23 DIAGNOSIS — E119 Type 2 diabetes mellitus without complications: Secondary | ICD-10-CM

## 2014-12-23 DIAGNOSIS — R0781 Pleurodynia: Principal | ICD-10-CM | POA: Diagnosis present

## 2014-12-23 DIAGNOSIS — Z720 Tobacco use: Secondary | ICD-10-CM | POA: Diagnosis present

## 2014-12-23 DIAGNOSIS — R9431 Abnormal electrocardiogram [ECG] [EKG]: Secondary | ICD-10-CM | POA: Diagnosis present

## 2014-12-23 DIAGNOSIS — Z5900 Homelessness unspecified: Secondary | ICD-10-CM

## 2014-12-23 DIAGNOSIS — F329 Major depressive disorder, single episode, unspecified: Secondary | ICD-10-CM | POA: Diagnosis present

## 2014-12-23 DIAGNOSIS — E1165 Type 2 diabetes mellitus with hyperglycemia: Secondary | ICD-10-CM | POA: Diagnosis present

## 2014-12-23 DIAGNOSIS — Z794 Long term (current) use of insulin: Secondary | ICD-10-CM

## 2014-12-23 DIAGNOSIS — R079 Chest pain, unspecified: Secondary | ICD-10-CM

## 2014-12-23 DIAGNOSIS — I10 Essential (primary) hypertension: Secondary | ICD-10-CM | POA: Diagnosis present

## 2014-12-23 DIAGNOSIS — R059 Cough, unspecified: Secondary | ICD-10-CM

## 2014-12-23 DIAGNOSIS — F17210 Nicotine dependence, cigarettes, uncomplicated: Secondary | ICD-10-CM | POA: Diagnosis present

## 2014-12-23 DIAGNOSIS — E785 Hyperlipidemia, unspecified: Secondary | ICD-10-CM | POA: Diagnosis present

## 2014-12-23 DIAGNOSIS — Z79899 Other long term (current) drug therapy: Secondary | ICD-10-CM

## 2014-12-23 DIAGNOSIS — G4700 Insomnia, unspecified: Secondary | ICD-10-CM | POA: Diagnosis present

## 2014-12-23 DIAGNOSIS — E118 Type 2 diabetes mellitus with unspecified complications: Secondary | ICD-10-CM

## 2014-12-23 DIAGNOSIS — E1129 Type 2 diabetes mellitus with other diabetic kidney complication: Secondary | ICD-10-CM | POA: Insufficient documentation

## 2014-12-23 DIAGNOSIS — F419 Anxiety disorder, unspecified: Secondary | ICD-10-CM | POA: Diagnosis present

## 2014-12-23 DIAGNOSIS — I160 Hypertensive urgency: Secondary | ICD-10-CM | POA: Diagnosis present

## 2014-12-23 DIAGNOSIS — R002 Palpitations: Secondary | ICD-10-CM | POA: Diagnosis present

## 2014-12-23 DIAGNOSIS — E43 Unspecified severe protein-calorie malnutrition: Secondary | ICD-10-CM | POA: Diagnosis present

## 2014-12-23 HISTORY — DX: Other chronic pain: G89.29

## 2014-12-23 HISTORY — DX: Migraine, unspecified, not intractable, without status migrainosus: G43.909

## 2014-12-23 HISTORY — DX: Type 2 diabetes mellitus without complications: E11.9

## 2014-12-23 HISTORY — DX: Unspecified convulsions: R56.9

## 2014-12-23 HISTORY — DX: Low back pain, unspecified: M54.50

## 2014-12-23 HISTORY — DX: Low back pain: M54.5

## 2014-12-23 LAB — TROPONIN I
Troponin I: <0.03 ng/mL (ref <0.031)
Troponin I: <0.03 ng/mL (ref <0.031)
Troponin I: <0.03 ng/mL (ref <0.031)
Troponin I: <0.03 ng/mL (ref <0.031)

## 2014-12-23 LAB — URINALYSIS, ROUTINE W REFLEX MICROSCOPIC
BILIRUBIN URINE: NEGATIVE
Glucose, UA: >1000 mg/dL — AB
KETONES UR: 40 mg/dL — AB
Leukocytes, UA: NEGATIVE
NITRITE: NEGATIVE
PH: 5.5 (ref 5.0–8.0)
Protein, ur: NEGATIVE mg/dL
SPECIFIC GRAVITY, URINE: 1.038 — AB (ref 1.005–1.030)

## 2014-12-23 LAB — COMPREHENSIVE METABOLIC PANEL
ALT: 19 U/L (ref 17–63)
AST: 36 U/L (ref 15–41)
Albumin: 3.7 g/dL (ref 3.5–5.0)
Alkaline Phosphatase: 78 U/L (ref 38–126)
Anion gap: 9 (ref 5–15)
BUN: 12 mg/dL (ref 6–20)
CHLORIDE: 100 mmol/L — AB (ref 101–111)
CO2: 26 mmol/L (ref 22–32)
Calcium: 9.4 mg/dL (ref 8.9–10.3)
Creatinine, Ser: 1 mg/dL (ref 0.61–1.24)
Glucose, Bld: 463 mg/dL — ABNORMAL HIGH (ref 65–99)
POTASSIUM: 4.6 mmol/L (ref 3.5–5.1)
SODIUM: 135 mmol/L (ref 135–145)
Total Bilirubin: 0.9 mg/dL (ref 0.3–1.2)
Total Protein: 6.5 g/dL (ref 6.5–8.1)

## 2014-12-23 LAB — URINE MICROSCOPIC-ADD ON
BACTERIA UA: NONE SEEN
WBC UA: NONE SEEN WBC/hpf (ref 0–5)

## 2014-12-23 LAB — RAPID URINE DRUG SCREEN, HOSP PERFORMED
Amphetamines: NOT DETECTED
BARBITURATES: NOT DETECTED
Benzodiazepines: NOT DETECTED
COCAINE: NOT DETECTED
OPIATES: NOT DETECTED
TETRAHYDROCANNABINOL: POSITIVE — AB

## 2014-12-23 LAB — CBG MONITORING, ED
GLUCOSE-CAPILLARY: 327 mg/dL — AB (ref 65–99)
GLUCOSE-CAPILLARY: 191 mg/dL — AB (ref 65–99)
Glucose-Capillary: 324 mg/dL — ABNORMAL HIGH (ref 65–99)

## 2014-12-23 LAB — I-STAT TROPONIN, ED: Troponin i, poc: 0 ng/mL (ref 0.00–0.08)

## 2014-12-23 LAB — CBC
HCT: 39.6 % (ref 39.0–52.0)
Hemoglobin: 12.7 g/dL — ABNORMAL LOW (ref 13.0–17.0)
MCH: 25.6 pg — AB (ref 26.0–34.0)
MCHC: 32.1 g/dL (ref 30.0–36.0)
MCV: 79.8 fL (ref 78.0–100.0)
PLATELETS: 304 10*3/uL (ref 150–400)
RBC: 4.96 MIL/uL (ref 4.22–5.81)
RDW: 12.6 % (ref 11.5–15.5)
WBC: 5.6 10*3/uL (ref 4.0–10.5)

## 2014-12-23 LAB — BRAIN NATRIURETIC PEPTIDE: B Natriuretic Peptide: 21.3 pg/mL (ref 0.0–100.0)

## 2014-12-23 LAB — APTT: aPTT: 26 seconds (ref 24–37)

## 2014-12-23 LAB — PROTIME-INR
INR: 0.97 (ref 0.00–1.49)
PROTHROMBIN TIME: 13.1 s (ref 11.6–15.2)

## 2014-12-23 LAB — GLUCOSE, CAPILLARY: Glucose-Capillary: 368 mg/dL — ABNORMAL HIGH (ref 65–99)

## 2014-12-23 MED ORDER — FOLIC ACID 1 MG PO TABS
1.0000 mg | ORAL_TABLET | Freq: Every day | ORAL | Status: DC
  Administered 2014-12-23 – 2014-12-26 (×4): 1 mg via ORAL
  Filled 2014-12-23 (×4): qty 1

## 2014-12-23 MED ORDER — ONDANSETRON HCL 4 MG PO TABS: 4.0000 mg | ORAL_TABLET | Freq: Four times a day (QID) | ORAL | Status: DC | PRN

## 2014-12-23 MED ORDER — ASPIRIN EC 325 MG PO TBEC
325.0000 mg | DELAYED_RELEASE_TABLET | Freq: Every day | ORAL | Status: DC
  Administered 2014-12-24 – 2014-12-26 (×3): 325 mg via ORAL
  Filled 2014-12-23 (×3): qty 1

## 2014-12-23 MED ORDER — VITAMIN B-1 100 MG PO TABS
100.0000 mg | ORAL_TABLET | Freq: Every day | ORAL | Status: DC
  Administered 2014-12-23 – 2014-12-26 (×4): 100 mg via ORAL
  Filled 2014-12-23 (×4): qty 1

## 2014-12-23 MED ORDER — ENOXAPARIN SODIUM 40 MG/0.4ML ~~LOC~~ SOLN
40.0000 mg | SUBCUTANEOUS | Status: DC
  Administered 2014-12-23 – 2014-12-25 (×3): 40 mg via SUBCUTANEOUS
  Filled 2014-12-23 (×3): qty 0.4

## 2014-12-23 MED ORDER — INSULIN ASPART 100 UNIT/ML ~~LOC~~ SOLN
10.0000 [IU] | Freq: Once | SUBCUTANEOUS | Status: AC
  Administered 2014-12-23: 10 [IU] via SUBCUTANEOUS
  Filled 2014-12-23: qty 1

## 2014-12-23 MED ORDER — INSULIN ASPART 100 UNIT/ML ~~LOC~~ SOLN
0.0000 [IU] | Freq: Three times a day (TID) | SUBCUTANEOUS | Status: DC
Start: 2014-12-23 — End: 2014-12-25
  Administered 2014-12-23: 7 [IU] via SUBCUTANEOUS
  Administered 2014-12-23: 2 [IU] via SUBCUTANEOUS
  Administered 2014-12-24: 3 [IU] via SUBCUTANEOUS
  Administered 2014-12-24: 9 [IU] via SUBCUTANEOUS
  Administered 2014-12-24: 2 [IU] via SUBCUTANEOUS
  Filled 2014-12-23 (×3): qty 1

## 2014-12-23 MED ORDER — SODIUM CHLORIDE 0.9 % IJ SOLN
3.0000 mL | Freq: Two times a day (BID) | INTRAMUSCULAR | Status: DC
  Administered 2014-12-23 – 2014-12-26 (×2): 3 mL via INTRAVENOUS

## 2014-12-23 MED ORDER — HYDROCOD POLST-CPM POLST ER 10-8 MG/5ML PO SUER
5.0000 mL | Freq: Every evening | ORAL | Status: DC | PRN
Start: 2014-12-23 — End: 2014-12-26

## 2014-12-23 MED ORDER — SODIUM CHLORIDE 0.9 % IV SOLN
INTRAVENOUS | Status: DC
  Administered 2014-12-23: 04:00:00 via INTRAVENOUS

## 2014-12-23 MED ORDER — ALUM & MAG HYDROXIDE-SIMETH 200-200-20 MG/5ML PO SUSP
30.0000 mL | Freq: Four times a day (QID) | ORAL | Status: DC | PRN
  Administered 2014-12-25: 30 mL via ORAL
  Filled 2014-12-23: qty 30

## 2014-12-23 MED ORDER — ONDANSETRON HCL 4 MG/2ML IJ SOLN
4.0000 mg | Freq: Four times a day (QID) | INTRAMUSCULAR | Status: DC | PRN
  Filled 2014-12-23: qty 2

## 2014-12-23 MED ORDER — ASPIRIN 81 MG PO CHEW
324.0000 mg | CHEWABLE_TABLET | Freq: Once | ORAL | Status: AC
  Administered 2014-12-23: 324 mg via ORAL
  Filled 2014-12-23: qty 4

## 2014-12-23 MED ORDER — ADULT MULTIVITAMIN W/MINERALS CH
1.0000 | ORAL_TABLET | Freq: Every day | ORAL | Status: DC
  Administered 2014-12-23 – 2014-12-26 (×4): 1 via ORAL
  Filled 2014-12-23 (×4): qty 1

## 2014-12-23 MED ORDER — GUAIFENESIN ER 600 MG PO TB12
600.0000 mg | ORAL_TABLET | Freq: Two times a day (BID) | ORAL | Status: DC
  Administered 2014-12-23 – 2014-12-26 (×6): 600 mg via ORAL
  Filled 2014-12-23 (×7): qty 1

## 2014-12-23 MED ORDER — INSULIN GLARGINE 100 UNIT/ML ~~LOC~~ SOLN
15.0000 [IU] | Freq: Every day | SUBCUTANEOUS | Status: DC
Start: 2014-12-23 — End: 2014-12-24
  Administered 2014-12-23: 15 [IU] via SUBCUTANEOUS
  Filled 2014-12-23 (×3): qty 0.15

## 2014-12-23 MED ORDER — NITROGLYCERIN 0.4 MG SL SUBL
0.4000 mg | SUBLINGUAL_TABLET | SUBLINGUAL | Status: AC | PRN
  Administered 2014-12-23 (×3): 0.4 mg via SUBLINGUAL

## 2014-12-23 MED ORDER — ATORVASTATIN CALCIUM 20 MG PO TABS
20.0000 mg | ORAL_TABLET | Freq: Every day | ORAL | Status: DC
  Administered 2014-12-23 – 2014-12-26 (×4): 20 mg via ORAL
  Filled 2014-12-23 (×4): qty 1

## 2014-12-23 MED ORDER — HYDRALAZINE HCL 20 MG/ML IJ SOLN: 10.0000 mg | Freq: Four times a day (QID) | INTRAMUSCULAR | Status: DC | PRN

## 2014-12-23 MED ORDER — ENSURE ENLIVE PO LIQD
237.0000 mL | Freq: Two times a day (BID) | ORAL | Status: DC
  Administered 2014-12-24: 237 mL via ORAL

## 2014-12-23 MED ORDER — HEPARIN SODIUM (PORCINE) 5000 UNIT/ML IJ SOLN
4000.0000 [IU] | INTRAMUSCULAR | Status: AC
  Administered 2014-12-23: 4000 [IU] via INTRAVENOUS
  Filled 2014-12-23: qty 1

## 2014-12-23 MED ORDER — LOSARTAN POTASSIUM 25 MG PO TABS
25.0000 mg | ORAL_TABLET | Freq: Every day | ORAL | Status: DC
  Administered 2014-12-23 – 2014-12-26 (×4): 25 mg via ORAL
  Filled 2014-12-23 (×4): qty 1

## 2014-12-23 MED ORDER — OXYCODONE HCL 5 MG PO TABS
5.0000 mg | ORAL_TABLET | ORAL | Status: DC | PRN
  Administered 2014-12-24: 5 mg via ORAL
  Filled 2014-12-23: qty 1

## 2014-12-23 MED ORDER — MORPHINE SULFATE (PF) 2 MG/ML IV SOLN: 2.0000 mg | INTRAVENOUS | Status: DC | PRN

## 2014-12-23 MED ORDER — SODIUM CHLORIDE 0.9 % IV SOLN
INTRAVENOUS | Status: DC
  Administered 2014-12-23 – 2014-12-25 (×5): via INTRAVENOUS

## 2014-12-23 MED ORDER — TRAZODONE HCL 100 MG PO TABS
100.0000 mg | ORAL_TABLET | Freq: Every day | ORAL | Status: DC
  Administered 2014-12-23 – 2014-12-25 (×3): 100 mg via ORAL
  Filled 2014-12-23 (×3): qty 1

## 2014-12-23 MED ORDER — ALBUTEROL SULFATE (2.5 MG/3ML) 0.083% IN NEBU: 2.5000 mg | INHALATION_SOLUTION | RESPIRATORY_TRACT | Status: DC | PRN

## 2014-12-23 MED ORDER — CLONAZEPAM 0.5 MG PO TABS
0.5000 mg | ORAL_TABLET | Freq: Every day | ORAL | Status: DC
  Administered 2014-12-23 – 2014-12-26 (×4): 0.5 mg via ORAL
  Filled 2014-12-23 (×4): qty 1

## 2014-12-23 NOTE — Consult Note (Signed)
Name: Mike [name] is a 54 y.o. male Admit date: 12/23/2014 Referring Physician:  Algis Downs, PA-C/Oni, M.D. Primary Physician:  Mee Hives, Nurse Practitioner Primary Cardiologist:  None  Reason for Consultation:  Chest pain  ASSESSMENT: 1. Chest pain, poorly characterized by patient and variously described in the chart as "pleuritic". Patient describes a feeling of fluttering/palpitations. No precipitants or exacerbating features are noted. 2. ST segment elevation on electrocardiogram, likely representing early repolarization. Prior EKGs have shown more significant and diffuse ST elevation in a pattern potentially suggestive of pericarditis. Today's ECG is not as abnormal appearing 3. Poorly controlled diabetes mellitus, type II, with complications 4. Hypertension, poorly controlled 5. Tobacco use 6. Hyperlipidemia 7. Homelessness  PLAN: 1. Serial cardiac markers to exclude ischemia/injury 2. 2-D Doppler echocardiogram to assess LV thickness, global function, and pericardial space. 3. Telemetry to exclude the possibility of arrhythmia accounting for the history of "fluttering". 4. Multiple cardiac risk factors making coronary artery disease highly likely. We should consider a myocardial perfusion study to ensure that we are not missing significant coronary disease presenting wi an atypical history.     HPI: Mike [name] is a 76 -year-old [place], homeless, male. He smokes cigarettes, has poorly controlled diabetes, hyperlipidemia, and essential hypertension. Medical problems are poorly controlled because of his social situation. He has followed by Mee Hives, NP. We are asked to see the patient on this occasion because of chest discomfort. There are multiple components including a sharp knifelike discomfort, and a sensation of fluttering. There are no precipitants or exacerbating features. The discomfort is not brought on by exertion. It is not associated with  dyspnea.  PMH:   Past Medical History  Diagnosis Date  . Diabetes mellitus   . Depression   . Anxiety   . Anxiety   . Hypertension   . Hyperlipemia     PSH:  History reviewed. No pertinent past surgical history. Allergies:  Ibuprofen; Ibuprofen; Tylenol; and Tylenol Prior to Admit Meds:   (Not in a hospital admission) Fam HX:   History reviewed. No pertinent family history. Social HX:    Social History   Social History  . Marital Status: Single    Spouse Name: N/A  . Number of Children: N/A  . Years of Education: N/A   Occupational History  . Not on file.   Social History Main Topics  . Smoking status: Current Every Day Smoker -- 0.10 packs/day    Types: Cigarettes  . Smokeless tobacco: Not on file  . Alcohol Use: Yes  . Drug Use: No  . Sexual Activity: Not on file   Other Topics Concern  . Not on file   Social History Narrative     Review of Systems: He denies exertional limitations. He came to the ER because of recurring episodes of chest discomfort. Cannot afford or consistently take his medications. Smokes 4 cigarettes per day or more if he can get them. Denies orthopnea, PND, and edema. No history of CVA.  Physical Exam: Blood pressure 168/90, pulse 58, temperature 97.8 F (36.6 C), temperature source Oral, resp. rate 16, height  (1.93 m), weight 160 lb (72.576 kg), SpO2 100 %. Weight change:    Dread-locked, slender, under nourished appearing Black male No JVD No carotid bruits Chest is clear Cardiac exam reveals no S4 gallop Abdomen is soft No peripheral edema Neurological exam is grossly intact  Labs: Lab Results  Component Value Date   WBC 5.6 12/23/2014   HGB 12.7* 12/23/2014  HCT 39.6 12/23/2014   MCV 79.8 12/23/2014   PLT 304 12/23/2014    Recent Labs Lab 12/23/14 0411  NA 135  K 4.6  CL 100*  CO2 26  BUN 12  CREATININE 1.00  CALCIUM 9.4  PROT 6.5  BILITOT 0.9  ALKPHOS 78  ALT 19  AST 36  GLUCOSE 463*   No results  found for: PTT Lab Results  Component Value Date   INR 0.97 12/23/2014   Lab Results  Component Value Date   TROPONINI <0.03 12/23/2014    No results found for: CHOL No results found for: HDL No results found for: LDLCALC No results found for: TRIG No results found for: CHOLHDL No results found for: LDLDIRECT    Radiology:  Dg Chest Port 1 View  12/23/2014  CLINICAL DATA:  54 year old male with mid chest pain and shortness of breath EXAM: PORTABLE CHEST 1 VIEW COMPARISON:  Radiograph dated 04/20/2014 FINDINGS: The heart size and mediastinal contours are within normal limits. Both lungs are clear. The visualized skeletal structures are unremarkable. IMPRESSION: No active disease. Electronically Signed   By: Elgie CollardArash  Radparvar M.D.   On: 12/23/2014 06:25    EKG:  Normal sinus rhythm with generalized ST elevation in precordial leads and inferior leads. ST elevation is less severe than on prior tracings.     Lesleigh NoeSMITH III,HENRY W 12/23/2014 11:58 AM

## 2014-12-23 NOTE — ED Notes (Signed)
   CBG 324   

## 2014-12-23 NOTE — H&P (Signed)
[facility] History and Physical                                                                                    Mike [name], is a 54 y.o. male  MRN: [id number]   DOB - 11/11/1960  Admit Date - 12/23/2014  Outpatient Primary MD for the patient is Jacklynn Barnacle, NP  Referring Physician:  Dr. Mora Bellman  Chief Complaint:   Chief Complaint  Patient presents with  . Code STEMI     HPI  Mike [name]  is a 54 y.o. male, from [place] who is homeless.  He has DM, HTN, HLD, anxiety and insomnia.  He presents with chest pain.  The chest pain is central, worse with deep breathing and cough.  It is intermittent and lasts for approximately 20 minutes at a time.  He has had it for several months. It comes on even at rest and is sometimes associated with palpitations.  He has a dry cough that can initiate the CP.  Recently he has struggled with dizziness.   He denies orthopnea and PND.  He smokes approximately 4 cigarettes per day and drinks and average of 1 beer per day.  He states he gets his medications from the [facility], and takes them regularly.    He describes living in a tent, which has become cold and wet recently.  He is not sleeping well.  Review of Systems  HENT: Negative for congestion and sore throat.   Eyes: Negative for blurred vision and double vision.  Respiratory: Positive for cough. Negative for sputum production and wheezing.   Cardiovascular: Positive for chest pain and palpitations. Negative for orthopnea and PND.  Gastrointestinal: Negative.   Genitourinary: Negative.   Musculoskeletal: Negative.   Neurological: Positive for dizziness and headaches.  Endo/Heme/Allergies: Negative.   Psychiatric/Behavioral: Negative.     Past Medical History  Past Medical History  Diagnosis Date  . Diabetes mellitus   . Depression   . Anxiety   . Anxiety   . Hypertension   . Hyperlipemia     History reviewed. No pertinent past surgical history.    Social History Social  History  Substance Use Topics  . Smoking status: Current Every Day Smoker -- 0.10 packs/day    Types: Cigarettes  . Smokeless tobacco: Not on file  . Alcohol Use: Yes    Family History Father unknown.  Mother passed in [place] of unknown causes.  Prior to Admission medications   Medication Sig Start Date End Date Taking? Authorizing Provider  atorvastatin (LIPITOR) 20 MG tablet Take 20 mg by mouth daily.   Yes Historical Provider, MD  clonazePAM (KLONOPIN) 0.5 MG tablet Take 0.5 mg by mouth daily.   Yes Historical Provider, MD  insulin NPH-regular Human (NOVOLIN 70/30) (70-30) 100 UNIT/ML injection Inject 20 Units into the skin daily with breakfast.   Yes Historical Provider, MD  metoprolol (LOPRESSOR) 50 MG tablet Take 50 mg by mouth daily.   Yes Historical Provider, MD  benzonatate (TESSALON) 100 MG capsule Take 1 capsule (100 mg total) by mouth every 8 (eight) hours. Patient not taking: Reported on 10/27/2014 05/03/14   Dahlia Client Muthersbaugh, PA-C  fluticasone (FLONASE) 50 MCG/ACT nasal spray Place 2 sprays into both nostrils daily. Patient not taking: Reported on 10/27/2014 05/03/14   Dahlia ClientHannah Muthersbaugh, PA-C  guaiFENesin (MUCINEX) 600 MG 12 hr tablet Take 2 tablets (1,200 mg total) by mouth 2 (two) times daily. Patient not taking: Reported on 10/27/2014 05/03/14   Dahlia ClientHannah Muthersbaugh, PA-C  hydrOXYzine (ATARAX/VISTARIL) 10 MG tablet Take 1 tablet (10 mg total) by mouth every 6 (six) hours as needed for anxiety. Patient not taking: Reported on 10/27/2014 04/25/14   Everlene FarrierWilliam Dansie, PA-C    Allergies  Allergen Reactions  . Ibuprofen Other (See Comments)    "bloating"  . Ibuprofen Nausea And Vomiting  . Tylenol [Acetaminophen] Other (See Comments)    bloating  . Tylenol [Acetaminophen] Nausea And Vomiting    Physical Exam  Vitals  Blood pressure 171/81, pulse 73, temperature 97.8 F (36.6 C), temperature source Oral, resp. rate 14, height 6\' 4"  (1.93 m), weight 72.576 kg (160  lb), SpO2 100 %.   General: Wd, malnourished, dry appearing  male lying in bed in NAD, pleasant  Psych:  Normal affect and insight, Not Suicidal or Homicidal, Awake Alert, Oriented X 3.  Neuro:   No F.N deficits, ALL C.Nerves Intact, Strength 5/5 all 4 extremities, Sensation intact all 4 extremities.  ENT:  Poor dentition, PER, Ears are normal appearing.  MMM no erythema or exudates in oropharynx  Neck:  Supple, No lymphadenopathy appreciated  Respiratory:  Symmetrical chest wall movement, Good air movement bilaterally, CTAB.  Cardiac:  RRR, No Murmurs, no LE edema noted, no JVD.    Abdomen:  Thin Positive bowel sounds, Soft, Non tender, Non distended,  No masses appreciated  Skin:  No Cyanosis, Normal Skin Turgor, No Skin Rash or Bruise.  Extremities:  Able to move all 4. 5/5 strength in each,  no effusions.  Data Review  Wt Readings from Last 3 Encounters:  12/23/14 72.576 kg (160 lb)  10/27/14 74.56 kg (164 lb 6 oz)  05/17/14 77.111 kg (170 lb)    CBC  Recent Labs Lab 12/23/14 0411  WBC 5.6  HGB 12.7*  HCT 39.6  PLT 304  MCV 79.8  MCH 25.6*  MCHC 32.1  RDW 12.6    Chemistries   Recent Labs Lab 12/23/14 0411  NA 135  K 4.6  CL 100*  CO2 26  GLUCOSE 463*  BUN 12  CREATININE 1.00  CALCIUM 9.4  AST 36  ALT 19  ALKPHOS 78  BILITOT 0.9      CREATININE: 1 (12/23/14 0411) Estimated creatinine clearance - 87.7 mL/min    Coagulation profile  Recent Labs Lab 12/23/14 0411  INR 0.97     Cardiac Enzymes  Recent Labs Lab 12/23/14 0411  TROPONINI <0.03     Urinalysis:  Pending.  UDS pending.    Imaging results:   Dg Chest Port 1 View  12/23/2014  CLINICAL DATA:  54 year old male with mid chest pain and shortness of breath EXAM: PORTABLE CHEST 1 VIEW COMPARISON:  Radiograph dated 04/20/2014 FINDINGS: The heart size and mediastinal contours are within normal limits. Both lungs are clear. The visualized skeletal structures are  unremarkable. IMPRESSION: No active disease. Electronically Signed   By: Elgie CollardArash  Radparvar M.D.   On: 12/23/2014 06:25    My personal review of EKG: ST elevation in V2 - V5 slightly greater than previous   Assessment & Plan  Principal Problem:   Chest pain, pleuritic Active Problems:   Homelessness   Hypertensive urgency  DM (diabetes mellitus) (HCC)   Intermittent palpitations   Tobacco abuse   Pleuritic chest pain   Abnormal EKG   Pleuritic Chest Pain with palpitations. Initial troponin is wnl. EKG shows some increased ST elevation in lateral leads.  CXR negative.  Will hydrate and recheck CXR am.  Doubt TB but will check QuantiFERON Gold.  Airborne precautions until testing is negative. Cycle troponin.  2D echo.  Cards consult given HTN, HLD, DM, Tobacco use.  HTN urgency Patient likely not taking blood pressure medication  - metoprolol-which is prescribed once daily.  Pulse rate is slightly slow. We'll change to losartan 25 mg daily. While inpatient we will add hydralazine PRN.  Appreciate cardiology recommendations regarding hypertension.  Hyperlipidemia Continue statin  Uncontrolled diabetes mellitus Patient is prescribed 70/30 once daily. I will request a case management consultation to see if he might qualify for Lantus or levemir free of charge. Check hemoglobin A1c. Placed on Lantus and sliding scale sensitive.  Tobacco abuse  Counseled to quit. Smokes 4 cigarettes daily.  Insomnia Continue Klonopin.  Trial Trazodone.    Consultants Called:    cardiology  Family Communication:     Patient is alert, orientated and understands their plan of care.  Code Status:    full  Condition:    Guarded.  Potential Disposition:   Discharge in 48 - 36 hours.  Time spent in minutes : [address]   [facility] Algis Downs,  [place] on 12/23/2014 at 8:09 AM Between 7am to 7pm - Pager - [phone] After 7pm go to [email or web address] - password [email or web address] And look for the night  coverage person covering me after hours

## 2014-12-23 NOTE — ED Notes (Signed)
Zoll radiolucent pads and leads placed patient.

## 2014-12-23 NOTE — ED Notes (Signed)
Ordered diet tray 

## 2014-12-23 NOTE — ED Notes (Signed)
Pt states he is 6' and weighs 160 lbs.

## 2014-12-23 NOTE — ED Provider Notes (Signed)
CSN: [id number]     Arrival date & time 12/23/14  0346 History   First MD Initiated Contact with Patient 12/23/14 0406     Chief Complaint  Patient presents with  . Code STEMI     (Consider location/radiation/quality/duration/timing/severity/associated sxs/prior Treatment) HPI  Neko Sofranko is a 54 y.o. male with past medical history of hypertension, hyperlipidemia, diabetes presenting today with chest pain. Patient scheduled the pain is substernal and left-sided. Is described as a pressure without any radiation. He has shortness of breath as well. He states he feels like his heart is skipping a beat. He denies any vomiting or diaphoresis. Patient denies having this pain in the past. He denies heart attack in the past. He states his pain is improved with walking. He did not take any medications for this. It has been going on for the past 9 hours. There are no further complaints.  10 Systems reviewed and are negative for acute change except as noted in the HPI.     Past Medical History  Diagnosis Date  . Diabetes mellitus   . Depression   . Anxiety   . Anxiety   . Hypertension   . Hyperlipemia    History reviewed. No pertinent past surgical history. History reviewed. No pertinent family history. Social History  Substance Use Topics  . Smoking status: Current Every Day Smoker -- 0.10 packs/day    Types: Cigarettes  . Smokeless tobacco: None  . Alcohol Use: Yes    Review of Systems    Allergies  Ibuprofen; Ibuprofen; Tylenol; and Tylenol  Home Medications   Prior to Admission medications   Medication Sig Start Date End Date Taking? Authorizing Provider  atorvastatin (LIPITOR) 20 MG tablet Take 20 mg by mouth daily.   Yes Historical Provider, MD  clonazePAM (KLONOPIN) 0.5 MG tablet Take 0.5 mg by mouth daily.   Yes Historical Provider, MD  insulin NPH-regular Human (NOVOLIN 70/30) (70-30) 100 UNIT/ML injection Inject 20 Units into the skin daily with breakfast.   Yes  Historical Provider, MD  metoprolol (LOPRESSOR) 50 MG tablet Take 50 mg by mouth daily.   Yes Historical Provider, MD  benzonatate (TESSALON) 100 MG capsule Take 1 capsule (100 mg total) by mouth every 8 (eight) hours. Patient not taking: Reported on 10/27/2014 05/03/14   Dahlia Client Muthersbaugh, PA-C  fluticasone (FLONASE) 50 MCG/ACT nasal spray Place 2 sprays into both nostrils daily. Patient not taking: Reported on 10/27/2014 05/03/14   Dahlia Client Muthersbaugh, PA-C  guaiFENesin (MUCINEX) 600 MG 12 hr tablet Take 2 tablets (1,200 mg total) by mouth 2 (two) times daily. Patient not taking: Reported on 10/27/2014 05/03/14   Dahlia Client Muthersbaugh, PA-C  hydrOXYzine (ATARAX/VISTARIL) 10 MG tablet Take 1 tablet (10 mg total) by mouth every 6 (six) hours as needed for anxiety. Patient not taking: Reported on 10/27/2014 04/25/14   Everlene Farrier, PA-C   BP 175/86 mmHg  Pulse 56  Temp(Src) 97.8 F (36.6 C) (Oral)  Resp 16  Ht  (1.93 m)  Wt 160 lb (72.576 kg)  BMI 19.48 kg/m2  SpO2 100% Physical Exam  Constitutional: He is oriented to person, place, and time. Vital signs are normal. He appears well-developed and well-nourished.  Non-toxic appearance. He does not appear ill. No distress.  HENT:  Head: Normocephalic and atraumatic.  Nose: Nose normal.  Mouth/Throat: Oropharynx is clear and moist. No oropharyngeal exudate.  Eyes: Conjunctivae and EOM are normal. Pupils are equal, round, and reactive to light. No scleral icterus.  Neck: Normal  range of motion. Neck supple. No tracheal deviation, no edema, no erythema and normal range of motion present. No thyroid mass and no thyromegaly present.  Cardiovascular: Normal rate, regular rhythm, S1 normal, S2 normal, normal heart sounds, intact distal pulses and normal pulses.  Exam reveals no gallop and no friction rub.   No murmur heard. Pulmonary/Chest: Effort normal and breath sounds normal. No respiratory distress. He has no wheezes. He has no rhonchi. He  has no rales.  Abdominal: Soft. Normal appearance and bowel sounds are normal. He exhibits no distension, no ascites and no mass. There is no hepatosplenomegaly. There is no tenderness. There is no rebound, no guarding and no CVA tenderness.  Musculoskeletal: Normal range of motion. He exhibits no edema or tenderness.  Lymphadenopathy:    He has no cervical adenopathy.  Neurological: He is alert and oriented to person, place, and time. He has normal strength. No cranial nerve deficit or sensory deficit.  Skin: Skin is warm, dry and intact. No petechiae and no rash noted. He is not diaphoretic. No erythema. No pallor.  Psychiatric: He has a normal mood and affect. His behavior is normal. Judgment normal.  Nursing note and vitals reviewed.   ED Course  Procedures (including critical care time) Labs Review Labs Reviewed  CBC - Abnormal; Notable for the following:    Hemoglobin 12.7 (*)    MCH 25.6 (*)    All other components within normal limits  COMPREHENSIVE METABOLIC PANEL - Abnormal; Notable for the following:    Chloride 100 (*)    Glucose, Bld 463 (*)    All other components within normal limits  APTT  PROTIME-INR  TROPONIN I  I-STAT TROPOININ, ED    Imaging Review No results found. I have personally reviewed and evaluated these images and lab results as part of my medical decision-making.   EKG Interpretation   Date/Time:  Wednesday December 23 2014 04:42:18 EST Ventricular Rate:  59 PR Interval:  148 QRS Duration: 90 QT Interval:  441 QTC Calculation: 437 R Axis:   45 Text Interpretation:  Sinus rhythm Abnormal R-wave progression, early  transition ST elevation, consider anterolateral injury STD in Lead III  resolved Confirmed by Erroll Lunani, Lindel Marcell Ayokunle [phone]([id number]) on 12/23/2014 5:10:38  AM      MDM   Final diagnoses:  None    Patient presents to the emergency department for evaluation of chest pain. EKG shows new ST elevation in the lateral leads with depression  in the inferior leads. Code STEMI was called. I spoke with Dr. Onalee HuaAlvarez who was initially in agreement with the plan, however troponin came back at 0. Decision was made to cancel code STEMI and to admit the patient to internal medicine for chest pain workup. Patient has RUQ to heparin and nitroglycerin. He continues to have chest pain.    Tomasita CrumbleAdeleke Charmion Hapke, MD 12/23/14 [phone]

## 2014-12-23 NOTE — ED Notes (Signed)
Patient c/o substernal CP with no radiation starting last night around 8pm with SOB. Patient states he feels like his heart is skipping a beat from time to time as well. HX HTN, DM. Patient A&O x 4.

## 2014-12-23 NOTE — Progress Notes (Signed)
   12/23/14 0423  Clinical Encounter Type  Visited With Health care provider  Visit Type Initial;Code   Chaplain responded to a code STEMI in the ED. No family is present at this time. Chaplain support available as needed.   Alda PonderAdam M Vallie Teters, Chaplain 12/23/2014 4:24 AM

## 2014-12-23 NOTE — ED Notes (Signed)
Placed order for pt. Breakfast tray 

## 2014-12-23 NOTE — ED Notes (Signed)
Pt received meal tray. 

## 2014-12-24 ENCOUNTER — Inpatient Hospital Stay (HOSPITAL_COMMUNITY): Payer: Self-pay

## 2014-12-24 ENCOUNTER — Ambulatory Visit (HOSPITAL_COMMUNITY): Payer: MEDICAID

## 2014-12-24 DIAGNOSIS — E43 Unspecified severe protein-calorie malnutrition: Secondary | ICD-10-CM | POA: Insufficient documentation

## 2014-12-24 DIAGNOSIS — E1169 Type 2 diabetes mellitus with other specified complication: Secondary | ICD-10-CM

## 2014-12-24 LAB — GLUCOSE, CAPILLARY
GLUCOSE-CAPILLARY: 293 mg/dL — AB (ref 65–99)
GLUCOSE-CAPILLARY: 399 mg/dL — AB (ref 65–99)
GLUCOSE-CAPILLARY: 202 mg/dL — AB (ref 65–99)
GLUCOSE-CAPILLARY: 246 mg/dL — AB (ref 65–99)

## 2014-12-24 LAB — HEMOGLOBIN A1C
HEMOGLOBIN A1C: 11.2 % — AB (ref 4.8–5.6)
Mean Plasma Glucose: 275 mg/dL

## 2014-12-24 LAB — BASIC METABOLIC PANEL
ANION GAP: 6 (ref 5–15)
BUN: 8 mg/dL (ref 6–20)
CALCIUM: 8.6 mg/dL — AB (ref 8.9–10.3)
CO2: 26 mmol/L (ref 22–32)
Chloride: 102 mmol/L (ref 101–111)
Creatinine, Ser: 0.79 mg/dL (ref 0.61–1.24)
GLUCOSE: 367 mg/dL — AB (ref 65–99)
POTASSIUM: 3.8 mmol/L (ref 3.5–5.1)
Sodium: 134 mmol/L — ABNORMAL LOW (ref 135–145)

## 2014-12-24 LAB — URINE CULTURE

## 2014-12-24 MED ORDER — INFLUENZA VAC SPLIT QUAD 0.5 ML IM SUSY
0.5000 mL | PREFILLED_SYRINGE | INTRAMUSCULAR | Status: AC
  Administered 2014-12-25: 0.5 mL via INTRAMUSCULAR
  Filled 2014-12-24: qty 0.5

## 2014-12-24 MED ORDER — INSULIN GLARGINE 100 UNIT/ML ~~LOC~~ SOLN
20.0000 [IU] | Freq: Every day | SUBCUTANEOUS | Status: DC
  Administered 2014-12-24: 20 [IU] via SUBCUTANEOUS
  Filled 2014-12-24 (×2): qty 0.2

## 2014-12-24 MED ORDER — PNEUMOCOCCAL VAC POLYVALENT 25 MCG/0.5ML IJ INJ: 0.5000 mL | INJECTION | INTRAMUSCULAR | Status: DC | PRN

## 2014-12-24 MED ORDER — GLUCERNA SHAKE PO LIQD
237.0000 mL | Freq: Three times a day (TID) | ORAL | Status: DC
  Administered 2014-12-24 – 2014-12-26 (×6): 237 mL via ORAL

## 2014-12-24 NOTE — Progress Notes (Signed)
Inpatient Diabetes Program Recommendations  AACE/ADA: New Consensus Statement on Inpatient Glycemic Control (2015)  Target Ranges:  Prepandial:   less than 140 mg/dL      Peak postprandial:   less than 180 mg/dL (1-2 hours)      Critically ill patients:  140 - 180 mg/dL   Review of Glycemic Control  Diabetes history: DM 2 Outpatient Diabetes medications: 70/30 20 units Daily Current orders for Inpatient glycemic control: Lantus 15 units QHS, Novolog Sensitive TID  Inpatient Diabetes Program Recommendations: Insulin - Basal: Fasting glucose 293 this am. Please consider increasing Lantus to 22 units QHS. HgbA1C: 11.2% poor control at home at current home med regimen.  Thanks,  Christena DeemShannon Perel Hauschild RN, MSN, [facility] Inpatient Diabetes Coordinator Team Pager [phone] (8a-5p)

## 2014-12-24 NOTE — Progress Notes (Signed)
       Patient Name: Mike [name] Date of Encounter: 12/24/2014    SUBJECTIVE: Complaints of very vague. Chest is uncomfortable only when he coughs.  TELEMETRY:  Sinus rhythm without arrhythmia. Filed Vitals:   12/23/14 2016 12/24/14 0436 12/24/14 0622 12/24/14 0937  BP: 158/73 171/83  141/75  Pulse: 65 72  72  Temp: 99 F (37.2 C) 98.8 F (37.1 C)  98.5 F (36.9 C)  TempSrc: Oral Oral  Oral  Resp: 17   18  Height: 6\' 4"  (1.93 m)     Weight: 150 lb 5.7 oz (68.2 kg)     SpO2: 99% 98% 98% 99%    Intake/Output Summary (Last 24 hours) at 12/24/14 1042 Last data filed at 12/24/14 [id number]  Gross per 24 hour  Intake    700 ml  Output    750 ml  Net    -50 ml   LABS: Basic Metabolic Panel:  Recent Labs  72/53/6610/08/01 0411 12/24/14 0545  NA 135 134*  K 4.6 3.8  CL 100* 102  CO2 26 26  GLUCOSE 463* 367*  BUN 12 8  CREATININE 1.00 0.79  CALCIUM 9.4 8.6*   CBC:  Recent Labs  12/23/14 0411  WBC 5.6  HGB 12.7*  HCT 39.6  MCV 79.8  PLT 304   Cardiac Enzymes:  Recent Labs  12/23/14 0811 12/23/14 1501 12/23/14 2030  TROPONINI <0.03 <0.03 <0.03   BNP: Invalid input(s): POCBNP Hemoglobin A1C:  Recent Labs  12/23/14 0811  HGBA1C 11.2*   Fasting Lipid Panel: No results for input(s): CHOL, HDL, LDLCALC, TRIG, CHOLHDL, LDLDIRECT in the last 72 hours.  Radiology/Studies:  No new data  Physical Exam: Blood pressure 141/75, pulse 72, temperature 98.5 F (36.9 C), temperature source Oral, resp. rate 18, height 6\' 4"  (1.93 m), weight 150 lb 5.7 oz (68.2 kg), SpO2 99 %. Weight change: -9 lb 10.3 oz (-4.376 kg)  Wt Readings from Last 3 Encounters:  12/23/14 150 lb 5.7 oz (68.2 kg)  10/27/14 164 lb 6 oz (74.56 kg)  05/17/14 170 lb (77.111 kg)    No pericardial friction rub.  An S4 gallop is audible.  ASSESSMENT:  1. Based upon cardiac markers, myocardial infarction has been excluded. 2. Suspect left ventricular hypertrophy 3. Vast untreated  cardiovascular risk factors due to social situation and homelessness.  Plan:  1. Await echo results 2. Case manager/social work 3. Unless surprises on echo such as regional wall motion abnormality, further cardiac evaluation will be deferred.  Selinda EonSigned, Verble Styron III,Kaisyn Reinhold W 12/24/2014, 10:42 AM

## 2014-12-24 NOTE — Progress Notes (Signed)
12/24/2014 12:24 PM  Patient was found to have an empty pill bottle on bedside table. I asked patient if he had taken the medication that was in the bottle. He stated that it was empty when he arrived her on admission. He only had it out to see what kind of medication it was. Educated patient about not taking medication that was not given to him buy the attending RNs. Patient verbalized understanding. Will continue to assess and monitor the patient.   PACCAR Incyanne Hill BSN, RN-BC, [employer] [facility] Phone [phone]

## 2014-12-24 NOTE — Progress Notes (Signed)
Initial Nutrition Assessment  DOCUMENTATION CODES:   Severe malnutrition in context of social or environmental circumstances, Underweight  INTERVENTION:  Discontinue Ensure.   Provide Glucerna Shake po TID, each supplement provides 220 kcal and 10 grams of protein.  Encourage adequate PO intake.  NUTRITION DIAGNOSIS:   Malnutrition related to social / environmental circumstances as evidenced by severe depletion of body fat, severe depletion of muscle mass, percent weight loss.  GOAL:   Patient will meet greater than or equal to 90% of their needs  MONITOR:   PO intake, Supplement acceptance, Weight trends, Labs, I & O's  REASON FOR ASSESSMENT:   Malnutrition Screening Tool    ASSESSMENT:   54 y[place] from [place] who is homeless. He has DM, HTN, HLD, anxiety and insomnia. He presents with chest pain.  Pt reports having a good appetite currently and PTA with consumption of at least 3 meals a day. Meal completion has been 100%. Pt reports usual body weight of ~160 lbs. Per Epic weight records, pt with a 8.5% weight loss in 2 months. Pt currently has Ensure ordered. Noted, CBG's have been elevated. RD to order Glucerna instead. Pt was encouraged to eat his food at meals and to drink his supplements.   Nutrition-Focused physical exam completed. Findings are severe fat depletion, moderate to severe muscle depletion, and no edema.   Labs and medications reviewed.  CBG's 191-399 ml/dL.   Diet Order:  Diet heart healthy/carb modified Room service appropriate?: Yes; Fluid consistency:: Thin  Skin:  Reviewed, no issues  Last BM:  12/6  Height:   Ht Readings from Last 1 Encounters:  12/23/14 6\' 4"  (1.93 m)    Weight:   Wt Readings from Last 1 Encounters:  12/23/14 150 lb 5.7 oz (68.2 kg)    Ideal Body Weight:  91.8 kg  BMI:  Body mass index is 18.31 kg/(m^2).  Estimated Nutritional Needs:   Kcal:  2100-2300  Protein:  105-115 grams  Fluid:  2.1 - 2.3  L/day  EDUCATION NEEDS:   No education needs identified at this time  Roslyn SmilingStephanie Tymere Depuy, MS, RD, LDN Pager # [phone] After hours/ weekend pager # [phone]

## 2014-12-24 NOTE — Care Management Note (Signed)
Case Management Note  Patient Details  Name: Mike [name] MRN: [id number] Date of Birth: 11/04/1960  Subjective/Objective:             CM following for progression and d/c planning.       Action/Plan: 12/24/14 Noted referral re pt ability to obtain Levamir or Lantus Insulin. This CM spoke with [name] at [facility], she states that this pt is seen at this clinic and that she has both insulins in her supply to provide initial prescriptions. The pt will be instructed to go to the [facility] for further assistance and apply for MAP ( medication assistance plan). She also state that may of her pt are hesitant to complete this process for fear of providing income information. This will be the pts responsibility and he can receive this medication only if he completes this process. This can not be done on the pt behalf, will encourage his participation.   Expected Discharge Date:                  Expected Discharge Plan:  Home/Self Care  In-House Referral:  Clinical Social Work  Discharge planning Services  CM Consult, Medication Assistance  Post Acute Care Choice:  NA Choice offered to:  NA  DME Arranged:  N/A DME Agency:  NA  HH Arranged:  NA HH Agency:  NA  Status of Service:  In process, will continue to follow  Medicare Important Message Given:    Date Medicare IM Given:    Medicare IM give by:    Date Additional Medicare IM Given:    Additional Medicare Important Message give by:     If discussed at Long Length of Stay Meetings, dates discussed:    Additional Comments:  Starlyn SkeansRoyal, Tiah Heckel U, RN 12/24/2014, 1:08 PM

## 2014-12-24 NOTE — Progress Notes (Signed)
[facility] PROGRESS NOTE  Winifred Clayton ZOX:[id number] DOB: 15-Aug-1960 DOA: 12/23/2014 PCP: Jacklynn Barnacle, NP  Assessment/Plan: Pleuritic chest pain: Appears to have improved.  ACS ruled out.  Negative enzymes.  Echo pending.  Cardiology consulted and recommendations given.  Quantiferon gold ordered and pending.    Hypertensive urgency: resolved. Resume home meds.   Uncontrolled DM: CBG (last 3)   Recent Labs  12/24/14 0759 12/24/14 1200 12/24/14 1631  GLUCAP 293* 399* 202*    INCREASED qhs Levemir.  hgba1c is 11.2 Resume SSI.    TOBACCO abuse; counseled .    Homeless ness: SW CONSULTED.    Code Status: full code.  Family Communication: none at bedside.  Disposition Plan:  Pending PT eval.    Consultants: cardiology Procedures:  echo  Antibiotics:  none  HPI/Subjective: Chest discomfort on coughing. No chest pain.   Objective: Filed Vitals:   12/24/14 0937 12/24/14 1633  BP: 141/75 152/72  Pulse: 72 65  Temp: 98.5 F (36.9 C) 98.7 F (37.1 C)  Resp: 18 17    Intake/Output Summary (Last 24 hours) at 12/24/14 1829 Last data filed at 12/24/14 1400  Gross per 24 hour  Intake   2235 ml  Output    350 ml  Net   1885 ml   Filed Weights   12/23/14 0357 12/23/14 0409 12/23/14 2016  Weight: 72.576 kg (160 lb) 72.576 kg (160 lb) 68.2 kg (150 lb 5.7 oz)    Exam:   General:  Alert afebrile comfortable.   Cardiovascular: S1S2  Respiratory: ctab   Abdomen: soft non tender non distended bowel sounds heard  Musculoskeletal: no pedal edema.   Data Reviewed: Basic Metabolic Panel:  Recent Labs Lab 12/23/14 0411 12/24/14 0545  NA 135 134*  K 4.6 3.8  CL 100* 102  CO2 26 26  GLUCOSE 463* 367*  BUN 12 8  CREATININE 1.00 0.79  CALCIUM 9.4 8.6*   Liver Function Tests:  Recent Labs Lab 12/23/14 0411  AST 36  ALT 19  ALKPHOS 78  BILITOT 0.9  PROT 6.5  ALBUMIN 3.7   No results for input(s): LIPASE, AMYLASE in the  last 168 hours. No results for input(s): AMMONIA in the last 168 hours. CBC:  Recent Labs Lab 12/23/14 0411  WBC 5.6  HGB 12.7*  HCT 39.6  MCV 79.8  PLT 304   Cardiac Enzymes:  Recent Labs Lab 12/23/14 0411 12/23/14 0811 12/23/14 1501 12/23/14 2030  TROPONINI <0.03 <0.03 <0.03 <0.03   BNP (last 3 results)  Recent Labs  12/23/14 0811  BNP 21.3    ProBNP (last 3 results) No results for input(s): PROBNP in the last 8760 hours.  CBG:  Recent Labs Lab 12/23/14 1710 12/23/14 2009 12/24/14 0759 12/24/14 1200 12/24/14 1631  GLUCAP 324* 368* 293* 399* 202*    Recent Results (from the past 240 hour(s))  Urine culture     Status: None   Collection Time: 12/23/14  9:12 AM  Result Value Ref Range Status   Specimen Description URINE, CLEAN CATCH  Final   Special Requests NONE  Final   Culture MULTIPLE SPECIES PRESENT, SUGGEST RECOLLECTION  Final   Report Status 12/24/2014 FINAL  Final     Studies: X-ray Chest Pa And Lateral  12/24/2014  CLINICAL DATA:  54 year old male with cough and shortness of Breath. Recent chest pain. Initial encounter. EXAM: CHEST  2 VIEW COMPARISON:  12/23/2014, and earlier FINDINGS: Lower lung volumes on the lateral views today. Cardiac size is at  the upper limits of normal to mildly enlarged. There may be left atrial enlargement, uncertain. Other mediastinal contours are within normal limits. Visualized tracheal air column is within normal limits. Evidence of paraseptal emphysema at the lung apices. No pneumothorax, pulmonary edema, pleural effusion or acute pulmonary opacity. Osteopenia. No acute osseous abnormality identified. IMPRESSION: 1.  No acute cardiopulmonary abnormality. 2. Emphysema suspected. 3. Possible left atrial enlargement. Electronically Signed   By: Odessa FlemingH  Hall M.D.   On: 12/24/2014 07:47   Dg Chest Port 1 View  12/23/2014  CLINICAL DATA:  54 year old male with mid chest pain and shortness of breath EXAM: PORTABLE CHEST 1 VIEW  COMPARISON:  Radiograph dated 04/20/2014 FINDINGS: The heart size and mediastinal contours are within normal limits. Both lungs are clear. The visualized skeletal structures are unremarkable. IMPRESSION: No active disease. Electronically Signed   By: Elgie CollardArash  Radparvar M.D.   On: 12/23/2014 06:25    Scheduled Meds: . aspirin EC  325 mg Oral Daily  . atorvastatin  20 mg Oral Daily  . clonazePAM  0.5 mg Oral Daily  . enoxaparin (LOVENOX) injection  40 mg Subcutaneous Q24H  . feeding supplement (GLUCERNA SHAKE)  237 mL Oral TID BM  . folic acid  1 mg Oral Daily  . guaiFENesin  600 mg Oral BID  . [START ON 12/25/2014] Influenza vac split quadrivalent PF  0.5 mL Intramuscular Tomorrow-1000  . insulin aspart  0-9 Units Subcutaneous TID WC  . insulin glargine  20 Units Subcutaneous QHS  . losartan  25 mg Oral Daily  . multivitamin with minerals  1 tablet Oral Daily  . sodium chloride  3 mL Intravenous Q12H  . thiamine  100 mg Oral Daily  . traZODone  100 mg Oral QHS   Continuous Infusions: . sodium chloride 100 mL/hr at 12/24/14 [id number]    Principal Problem:   Chest pain, pleuritic Active Problems:   Homelessness   Hypertensive urgency   DM (diabetes mellitus) (HCC)   Intermittent palpitations   Tobacco abuse   Pleuritic chest pain   Abnormal EKG   Protein-calorie malnutrition, severe    Time spent: 5025 MIN    [facility] LPKULA,Kurk Corniel  [facility] Pager [phone]  If 7PM-7AM, please contact night-coverage at [email or web address], password [email or web address] 12/24/2014, 6:29 PM  LOS: 1 day

## 2014-12-25 ENCOUNTER — Other Ambulatory Visit (HOSPITAL_COMMUNITY): Payer: Self-pay

## 2014-12-25 ENCOUNTER — Ambulatory Visit (HOSPITAL_COMMUNITY): Payer: Self-pay

## 2014-12-25 DIAGNOSIS — R079 Chest pain, unspecified: Secondary | ICD-10-CM

## 2014-12-25 DIAGNOSIS — R05 Cough: Secondary | ICD-10-CM

## 2014-12-25 LAB — GLUCOSE, CAPILLARY
Glucose-Capillary: 95 mg/dL (ref 65–99)
Glucose-Capillary: 179 mg/dL — ABNORMAL HIGH (ref 65–99)
Glucose-Capillary: 342 mg/dL — ABNORMAL HIGH (ref 65–99)
Glucose-Capillary: 81 mg/dL (ref 65–99)
Glucose-Capillary: 145 mg/dL — ABNORMAL HIGH (ref 65–99)

## 2014-12-25 LAB — QUANTIFERON IN TUBE
QUANTIFERON MITOGEN VALUE: 4.62 IU/mL
QUANTIFERON TB AG VALUE: 0.04 [IU]/mL
QUANTIFERON TB GOLD: NEGATIVE
Quantiferon Nil Value: 0.07 IU/mL

## 2014-12-25 LAB — QUANTIFERON TB GOLD ASSAY (BLOOD)

## 2014-12-25 MED ORDER — INSULIN ASPART 100 UNIT/ML ~~LOC~~ SOLN: 0.0000 [IU] | Freq: Every day | SUBCUTANEOUS | Status: DC

## 2014-12-25 MED ORDER — INSULIN ASPART 100 UNIT/ML ~~LOC~~ SOLN
0.0000 [IU] | Freq: Three times a day (TID) | SUBCUTANEOUS | Status: DC
  Administered 2014-12-25: 11 [IU] via SUBCUTANEOUS
  Administered 2014-12-25: 3 [IU] via SUBCUTANEOUS
  Administered 2014-12-26: 5 [IU] via SUBCUTANEOUS
  Administered 2014-12-26: 2 [IU] via SUBCUTANEOUS

## 2014-12-25 MED ORDER — INSULIN GLARGINE 100 UNIT/ML ~~LOC~~ SOLN
18.0000 [IU] | Freq: Every day | SUBCUTANEOUS | Status: DC
Start: 2014-12-25 — End: 2014-12-26
  Administered 2014-12-25: 18 [IU] via SUBCUTANEOUS
  Filled 2014-12-25 (×2): qty 0.18

## 2014-12-25 NOTE — Evaluation (Signed)
Physical Therapy Evaluation Patient Details Name: Tobin ChadDesbourns Landau MRN: [id number] DOB: 12/13/1960 Today's Date: 12/25/2014   History of Present Illness  pt presents with Chest Pain.  pt hx of Depression, DM, and HTN.    Clinical Impression  Pt moving independently in room without any deficits.  Discussed pt continuing to mobilize in room and to notify nsg if he begins to have difficulties with mobility.  Will sign off.      Follow Up Recommendations No PT follow up    Equipment Recommendations  None recommended by PT    Recommendations for Other Services       Precautions / Restrictions Precautions Precautions: None Restrictions Weight Bearing Restrictions: No      Mobility  Bed Mobility Overal bed mobility: Independent             General bed mobility comments: pt able to long sit in bed and then to EOB without any difficulties.    Transfers Overall transfer level: Independent Equipment used: None                Ambulation/Gait Ambulation/Gait assistance: Independent Ambulation Distance (Feet): 10 Feet Assistive device: None Gait Pattern/deviations: WFL(Within Functional Limits)     General Gait Details: pt able to ambulate in room without deficit and indicates he has been independently going to bathroom.    Stairs            Wheelchair Mobility    Modified Rankin (Stroke Patients Only)       Balance Overall balance assessment: Independent                                           Pertinent Vitals/Pain Pain Assessment: Faces Faces Pain Scale: Hurts a little bit Pain Location: pt indicates chest hurts at little when he coughs. Pain Descriptors / Indicators: Grimacing Pain Intervention(s): Monitored during session;Repositioned    Home Living Family/patient expects to be discharged to:: Shelter/Homeless                      Prior Function Level of Independence: Independent               Hand  Dominance        Extremity/Trunk Assessment   Upper Extremity Assessment: Overall WFL for tasks assessed           Lower Extremity Assessment: Overall WFL for tasks assessed      Cervical / Trunk Assessment: Normal  Communication   Communication: No difficulties  Cognition Arousal/Alertness: Awake/alert Behavior During Therapy: WFL for tasks assessed/performed Overall Cognitive Status: Within Functional Limits for tasks assessed                      General Comments      Exercises        Assessment/Plan    PT Assessment Patent does not need any further PT services  PT Diagnosis Difficulty walking   PT Problem List    PT Treatment Interventions     PT Goals (Current goals can be found in the Care Plan section) Acute Rehab PT Goals Patient Stated Goal: Feel better PT Goal Formulation: All assessment and education complete, DC therapy    Frequency     Barriers to discharge        Co-evaluation  End of Session   Activity Tolerance: Patient tolerated treatment well Patient left: in chair;with call bell/phone within reach;with nursing/sitter in room Nurse Communication: Mobility status         Time: 4098-1191 PT Time Calculation (min) (ACUTE ONLY): 11 min   Charges:   PT Evaluation $Initial PT Evaluation Tier I: 1 Procedure     PT G CodesSunny Schlein, [place] [phone] 12/25/2014, 1:31 PM

## 2014-12-25 NOTE — Progress Notes (Signed)
   We recommended Echo but I realize now that it was never ordered. I personally ordered today.  Chest pain is non-ischemic.  Assuming no LV dysfunction suggesting regional WMA, no further cardiac w/u will be required.

## 2014-12-25 NOTE — Progress Notes (Signed)
  Echocardiogram 2D Echocardiogram has been performed.  Leta JunglingCooper, Jarius Dieudonne M 12/25/2014, 2:45 PM

## 2014-12-25 NOTE — Progress Notes (Signed)
[facility] PROGRESS NOTE  Mike [name] WUJ:[id number] DOB: 24-Dec-1960 DOA: 12/23/2014 PCP: Jacklynn Barnacle, NP Interim summary: Mike [name] is a 54 y.o. male, from [place], with hypertension, DM hyperlipidemia, anxiety and insomnia presents with central chest pain, on further evaluation, sounds like pleuritic chest pain. ACS ruled out. Echocardiogram pending. Because he is homeless and coughing, with pleuritic chest pain, TB is being ruled out.   Assessment/Plan: Pleuritic chest pain: Appears to have improved.  ACS ruled out.  Negative enzymes.  Echo pending.  Cardiology consulted and recommendations given.  Quantiferon gold ordered and pending.    Hypertensive urgency: resolved. Resume home meds.   Uncontrolled DM: CBG (last 3)   Recent Labs  12/24/14 2120 12/25/14 0736 12/25/14 1202  GLUCAP 246* 95 179*    INCREASED qhs Levemir.  hgba1c is 11.2 Resume SSI.    TOBACCO abuse; counseled .    Homeless ness: SW CONSULTED.    Code Status: full code.  Family Communication: none at bedside.  Disposition Plan:  Pending PT eval.    Consultants: cardiology Procedures:  echo  Antibiotics:  none  HPI/Subjective: Chest discomfort on coughing. No chest pain.  No nausea or vomiting.  Recommended to get oob.   Objective: Filed Vitals:   12/25/14 0518 12/25/14 0954  BP: 156/72 155/79  Pulse: 64 64  Temp: 98.7 F (37.1 C) 98.7 F (37.1 C)  Resp: 19 20    Intake/Output Summary (Last 24 hours) at 12/25/14 1255 Last data filed at 12/25/14 0955  Gross per 24 hour  Intake 2316.66 ml  Output    300 ml  Net 2016.66 ml   Filed Weights   12/23/14 0409 12/23/14 2016 12/24/14 2121  Weight: 72.576 kg (160 lb) 68.2 kg (150 lb 5.7 oz) 68.4 kg (150 lb 12.7 oz)    Exam:   General:  Alert afebrile comfortable.   Cardiovascular: S1S2  Respiratory: ctab   Abdomen: soft non tender non distended bowel sounds heard  Musculoskeletal: no pedal  edema.   Data Reviewed: Basic Metabolic Panel:  Recent Labs Lab 12/23/14 0411 12/24/14 0545  NA 135 134*  K 4.6 3.8  CL 100* 102  CO2 26 26  GLUCOSE 463* 367*  BUN 12 8  CREATININE 1.00 0.79  CALCIUM 9.4 8.6*   Liver Function Tests:  Recent Labs Lab 12/23/14 0411  AST 36  ALT 19  ALKPHOS 78  BILITOT 0.9  PROT 6.5  ALBUMIN 3.7   No results for input(s): LIPASE, AMYLASE in the last 168 hours. No results for input(s): AMMONIA in the last 168 hours. CBC:  Recent Labs Lab 12/23/14 0411  WBC 5.6  HGB 12.7*  HCT 39.6  MCV 79.8  PLT 304   Cardiac Enzymes:  Recent Labs Lab 12/23/14 0411 12/23/14 0811 12/23/14 1501 12/23/14 2030  TROPONINI <0.03 <0.03 <0.03 <0.03   BNP (last 3 results)  Recent Labs  12/23/14 0811  BNP 21.3    ProBNP (last 3 results) No results for input(s): PROBNP in the last 8760 hours.  CBG:  Recent Labs Lab 12/24/14 1200 12/24/14 1631 12/24/14 2120 12/25/14 0736 12/25/14 1202  GLUCAP 399* 202* 246* 95 179*    Recent Results (from the past 240 hour(s))  Urine culture     Status: None   Collection Time: 12/23/14  9:12 AM  Result Value Ref Range Status   Specimen Description URINE, CLEAN CATCH  Final   Special Requests NONE  Final   Culture MULTIPLE SPECIES PRESENT, SUGGEST RECOLLECTION  Final  Report Status 12/24/2014 FINAL  Final     Studies: X-ray Chest Pa And Lateral  12/24/2014  CLINICAL DATA:  54 year old male with cough and shortness of Breath. Recent chest pain. Initial encounter. EXAM: CHEST  2 VIEW COMPARISON:  12/23/2014, and earlier FINDINGS: Lower lung volumes on the lateral views today. Cardiac size is at the upper limits of normal to mildly enlarged. There may be left atrial enlargement, uncertain. Other mediastinal contours are within normal limits. Visualized tracheal air column is within normal limits. Evidence of paraseptal emphysema at the lung apices. No pneumothorax, pulmonary edema, pleural effusion  or acute pulmonary opacity. Osteopenia. No acute osseous abnormality identified. IMPRESSION: 1.  No acute cardiopulmonary abnormality. 2. Emphysema suspected. 3. Possible left atrial enlargement. Electronically Signed   By: Odessa FlemingH  Hall M.D.   On: 12/24/2014 07:47    Scheduled Meds: . aspirin EC  325 mg Oral Daily  . atorvastatin  20 mg Oral Daily  . clonazePAM  0.5 mg Oral Daily  . enoxaparin (LOVENOX) injection  40 mg Subcutaneous Q24H  . feeding supplement (GLUCERNA SHAKE)  237 mL Oral TID BM  . folic acid  1 mg Oral Daily  . guaiFENesin  600 mg Oral BID  . insulin aspart  0-15 Units Subcutaneous TID WC  . insulin aspart  0-5 Units Subcutaneous QHS  . insulin glargine  18 Units Subcutaneous QHS  . losartan  25 mg Oral Daily  . multivitamin with minerals  1 tablet Oral Daily  . sodium chloride  3 mL Intravenous Q12H  . thiamine  100 mg Oral Daily  . traZODone  100 mg Oral QHS   Continuous Infusions: . sodium chloride 100 mL/hr at 12/25/14 [id number]    Principal Problem:   Chest pain, pleuritic Active Problems:   Homelessness   Hypertensive urgency   DM (diabetes mellitus) (HCC)   Intermittent palpitations   Tobacco abuse   Pleuritic chest pain   Abnormal EKG   Protein-calorie malnutrition, severe    Time spent: 5025 MIN    [facility] Center[facility]ULA,Nadeem Romanoski  [facility] Pager [phone]  If 7PM-7AM, please contact night-coverage at [email or web address], password [facility] 12/25/2014, 12:55 PM  LOS: 2 days

## 2014-12-25 NOTE — Progress Notes (Signed)
   12/25/14 1400  Clinical Encounter Type  Visited With Patient not available  Spoke to nurse about patient and gathered background information, but he was about to undergo echo, so was unable to see him. Will stop by again.

## 2014-12-26 ENCOUNTER — Other Ambulatory Visit (HOSPITAL_COMMUNITY): Payer: Self-pay

## 2014-12-26 LAB — BASIC METABOLIC PANEL
ANION GAP: 6 (ref 5–15)
BUN: 11 mg/dL (ref 6–20)
CALCIUM: 8.4 mg/dL — AB (ref 8.9–10.3)
CHLORIDE: 105 mmol/L (ref 101–111)
CO2: 26 mmol/L (ref 22–32)
Creatinine, Ser: 0.69 mg/dL (ref 0.61–1.24)
GFR calc Af Amer: >60 mL/min (ref >60)
GFR calc non Af Amer: >60 mL/min (ref >60)
GLUCOSE: 239 mg/dL — AB (ref 65–99)
Potassium: 4.1 mmol/L (ref 3.5–5.1)
Sodium: 137 mmol/L (ref 135–145)

## 2014-12-26 LAB — GLUCOSE, CAPILLARY
GLUCOSE-CAPILLARY: 145 mg/dL — AB (ref 65–99)
Glucose-Capillary: 262 mg/dL — ABNORMAL HIGH (ref 65–99)
Glucose-Capillary: 215 mg/dL — ABNORMAL HIGH (ref 65–99)

## 2014-12-26 LAB — CBC
HEMATOCRIT: 31.3 % — AB (ref 39.0–52.0)
Hemoglobin: 9.9 g/dL — ABNORMAL LOW (ref 13.0–17.0)
MCH: 25.4 pg — ABNORMAL LOW (ref 26.0–34.0)
MCHC: 31.6 g/dL (ref 30.0–36.0)
MCV: 80.5 fL (ref 78.0–100.0)
Platelets: 204 10*3/uL (ref 150–400)
RBC: 3.89 MIL/uL — ABNORMAL LOW (ref 4.22–5.81)
RDW: 13 % (ref 11.5–15.5)
WBC: 4.1 10*3/uL (ref 4.0–10.5)

## 2014-12-26 MED ORDER — CLONAZEPAM 0.5 MG PO TABS
0.5000 mg | ORAL_TABLET | Freq: Every day | ORAL | Status: DC
Start: 2014-12-26 — End: 2016-02-20

## 2014-12-26 MED ORDER — THIAMINE HCL 100 MG PO TABS: 100.0000 mg | ORAL_TABLET | Freq: Every day | ORAL | Status: DC

## 2014-12-26 MED ORDER — HYDROCOD POLST-CPM POLST ER 10-8 MG/5ML PO SUER: 5.0000 mL | Freq: Every evening | ORAL | Status: DC | PRN

## 2014-12-26 MED ORDER — INSULIN GLARGINE 100 UNITS/ML SOLOSTAR PEN: 18.0000 [IU] | PEN_INJECTOR | Freq: Every day | SUBCUTANEOUS | Status: DC

## 2014-12-26 MED ORDER — INSULIN GLARGINE 100 UNIT/ML ~~LOC~~ SOLN: 18.0000 [IU] | Freq: Every day | SUBCUTANEOUS | Status: DC

## 2014-12-26 MED ORDER — LOSARTAN POTASSIUM 25 MG PO TABS: 25.0000 mg | ORAL_TABLET | Freq: Every day | ORAL | Status: DC

## 2014-12-26 MED ORDER — GLUCERNA SHAKE PO LIQD: 237.0000 mL | Freq: Three times a day (TID) | ORAL | Status: DC

## 2014-12-26 MED ORDER — ADULT MULTIVITAMIN W/MINERALS CH: 1.0000 | ORAL_TABLET | Freq: Every day | ORAL | Status: DC

## 2014-12-26 MED ORDER — "PEN NEEDLES 1/2"" 29G X 12MM MISC": 90.0000 "pen " | Freq: Every day | Status: DC

## 2014-12-26 MED ORDER — FOLIC ACID 1 MG PO TABS: 1.0000 mg | ORAL_TABLET | Freq: Every day | ORAL | Status: DC

## 2014-12-26 NOTE — Progress Notes (Signed)
Patient ID: Mike [name], male   DOB: 1960/09/30, 54 y.o.   MRN: [id number]    Progress note from Dr Katrinka BlazingSmith reviewed, his plans were for no further cardiac workup pending echo results. Echo with normal LVEF 60-65%, mod LVH, no WMAs, grade I diastolic dysfunction. No significant findings, no further cardiac testing planned at this time. We will sign off   Dominga FerryJ Lilyana Lippman MD

## 2014-12-26 NOTE — Care Management (Signed)
CM consulted regarding patient being discharged. Patient is homeless and medication assistance. Patient is followed by [name],  However [facility] is closed on the weekend and cannot secure a confirmed  appt.Patient is being discharged on insulin. Patient will need to be MATCH, discussed MATCH and guidelines [facility] any of [facility] participating pharmacies,to redeemt teach back done verbalized understanding. Discussed the importance of follow up. [facility] at 9a patient encouraged to keep appointment. Verbalized understanding. Patient also given resources for [facility] [facility] and food pantries, along with a bus pass. No further CM needs identified

## 2014-12-28 ENCOUNTER — Ambulatory Visit: Payer: Self-pay | Admitting: Family Medicine

## 2014-12-28 NOTE — Discharge Summary (Signed)
Physician Discharge Summary  Mike [name] UJW:[id number] DOB: Oct 26, 1960 DOA: 12/23/2014  PCP: Mike BarnaclePLACEY,Mike H, NP  Admit date: 12/23/2014 Discharge date: 12/26/2014  Time spent: 25 minutes  Recommendations for Outpatient Follow-up:  1. Follow up with PCP in one week.    Discharge Diagnoses:  Principal Problem:   Chest pain, pleuritic Active Problems:   Homelessness   Hypertensive urgency   DM (diabetes mellitus) (HCC)   Intermittent palpitations   Tobacco abuse   Pleuritic chest pain   Abnormal EKG   Protein-calorie malnutrition, severe   Discharge Condition: improved  Diet recommendation: carb modified diet.  Filed Weights   12/24/14 2121 12/25/14 2053 12/26/14 0500  Weight: 68.4 kg (150 lb 12.7 oz) 68.6 kg (151 lb 3.8 oz) 68.6 kg (151 lb 3.8 oz)    History of present illness:  Mike [name] is a 54 y.o. [place] with hypertension, DM hyperlipidemia, anxiety and insomnia presents with central chest pain, on further evaluation, sounds like pleuritic chest pain. ACS ruled out. Echocardiogram pending. Because he is homeless and coughing, with pleuritic chest pain, ,.    Hospital Course:  Pleuritic chest pain: Appears to have improved.  ACS ruled out.  Negative enzymes.  Echo not significant.  Cardiology consulted and recommendations given.  Quantiferon gold ordered and negative.    Hypertensive urgency: resolved. Resume home meds.   Uncontrolled DM: CBG (last 3)   Recent Labs (last 2 labs)      Recent Labs  12/24/14 2120 12/25/14 0736 12/25/14 1202  GLUCAP 246* 95 179*      INCREASED qhs lantus. hgba1c is 11.2 Resume SSI.    TOBACCO abuse; counseled .    Homeless ness: SW CONSULTED and recommendations given.       Procedures:  Echocardiogram.  Consultations:  Cardiology.  Discharge Exam: Filed Vitals:   12/26/14 0444 12/26/14 0824  BP: 168/87 150/86  Pulse: 64 60  Temp: 98.5 F (36.9 C) 98.6 F  (37 C)  Resp: 20 19    General: alert afebrile comfortable Cardiovascular: s1s2 Respiratory: ctab  Discharge Instructions   Discharge Instructions    Diet - low sodium heart healthy    Complete by:  As directed      Discharge instructions    Complete by:  As directed   Please [facility] as recommended.          Discharge Medication List as of 12/26/2014  2:16 PM    START taking these medications   Details  chlorpheniramine-HYDROcodone (TUSSIONEX) 10-8 MG/5ML SUER Take 5 mLs by mouth at bedtime as needed for cough., Starting 12/26/2014, Until Discontinued, Print    feeding supplement, GLUCERNA SHAKE, (GLUCERNA SHAKE) LIQD Take 237 mLs by mouth 3 (three) times daily between meals., Starting 12/26/2014, Until Discontinued, No Print    folic acid (FOLVITE) 1 MG tablet Take 1 tablet (1 mg total) by mouth daily., Starting 12/26/2014, Until Discontinued, Print    insulin glargine (LANTUS) 100 unit/mL SOPN Inject 0.18 mLs (18 Units total) into the skin at bedtime., Starting 12/26/2014, Until Discontinued, Print    Insulin Pen Needle (PEN NEEDLES 29GX1/2") 29G X 12MM MISC 90 pens by Does not apply route daily., Starting 12/26/2014, Until Discontinued, Print    losartan (COZAAR) 25 MG tablet Take 1 tablet (25 mg total) by mouth daily., Starting 12/26/2014, Until Discontinued, Print    Multiple Vitamin (MULTIVITAMIN WITH MINERALS) TABS tablet Take 1 tablet by mouth daily., Starting 12/26/2014, Until Discontinued, Print  thiamine 100 MG tablet Take 1 tablet (100 mg total) by mouth daily., Starting 12/26/2014, Until Discontinued, Print      CONTINUE these medications which have NOT CHANGED   Details  atorvastatin (LIPITOR) 20 MG tablet Take 20 mg by mouth daily., Until Discontinued, Historical Med    clonazePAM (KLONOPIN) 0.5 MG tablet Take 0.5 mg by mouth daily., Until Discontinued, Historical Med      STOP taking these medications      benzonatate (TESSALON) 100 MG capsule      fluticasone (FLONASE) 50 MCG/ACT nasal spray      guaiFENesin (MUCINEX) 600 MG 12 hr tablet      hydrOXYzine (ATARAX/VISTARIL) 10 MG tablet      insulin NPH-regular Human (NOVOLIN 70/30) (70-30) 100 UNIT/ML injection      metoprolol (LOPRESSOR) 50 MG tablet        Allergies  Allergen Reactions  . Ibuprofen Other (See Comments)    "bloating"  . Ibuprofen Nausea And Vomiting  . Tylenol [Acetaminophen] Other (See Comments)    bloating  . Tylenol [Acetaminophen] Nausea And Vomiting   Follow-up Information    Follow up with [facility]. Call on 12/28/2014.   Specialty:  Internal Medicine   Why:  Appointment scheduled [facility] Monday 12/12 at 9am with [facility] NP   Contact information:   [address] [place] [address] [phone]       The results of significant diagnostics from this hospitalization (including imaging, microbiology, ancillary and laboratory) are listed below for reference.    Significant Diagnostic Studies: X-ray Chest Pa And Lateral  12/24/2014  CLINICAL DATA:  54 year old male with cough and shortness of Breath. Recent chest pain. Initial encounter. EXAM: CHEST  2 VIEW COMPARISON:  12/23/2014, and earlier FINDINGS: Lower lung volumes on the lateral views today. Cardiac size is at the upper limits of normal to mildly enlarged. There may be left atrial enlargement, uncertain. Other mediastinal contours are within normal limits. Visualized tracheal air column is within normal limits. Evidence of paraseptal emphysema at the lung apices. No pneumothorax, pulmonary edema, pleural effusion or acute pulmonary opacity. Osteopenia. No acute osseous abnormality identified. IMPRESSION: 1.  No acute cardiopulmonary abnormality. 2. Emphysema suspected. 3. Possible left atrial enlargement. Electronically Signed   By: Odessa Fleming M.D.   On: 12/24/2014 07:47   Dg Chest  Port 1 View  12/23/2014  CLINICAL DATA:  54 year old male with mid chest pain and shortness of breath EXAM: PORTABLE CHEST 1 VIEW COMPARISON:  Radiograph dated 04/20/2014 FINDINGS: The heart size and mediastinal contours are within normal limits. Both lungs are clear. The visualized skeletal structures are unremarkable. IMPRESSION: No active disease. Electronically Signed   By: Elgie Collard M.D.   On: 12/23/2014 06:25    Microbiology: Recent Results (from the past 240 hour(s))  Urine culture     Status: None   Collection Time: 12/23/14  9:12 AM  Result Value Ref Range Status   Specimen Description URINE, CLEAN CATCH  Final   Special Requests NONE  Final   Culture MULTIPLE SPECIES PRESENT, SUGGEST RECOLLECTION  Final   Report Status 12/24/2014 FINAL  Final     Labs: Basic Metabolic Panel:  Recent Labs Lab 12/23/14 0411 12/24/14 0545 12/26/14 0546  NA 135 134* 137  K 4.6 3.8 4.1  CL 100* 102 105  CO2 GLUCOSE 463* 367* 239*  BUN 12 8 11  CREATININE 1.00 0.79 0.69  CALCIUM 9.4 8.6* 8.4*   Liver Function Tests:  Recent Labs Lab 12/23/14 0411  AST 36  ALT 19  ALKPHOS 78  BILITOT 0.9  PROT 6.5  ALBUMIN 3.7   No results for input(s): LIPASE, AMYLASE in the last 168 hours. No results for input(s): AMMONIA in the last 168 hours. CBC:  Recent Labs Lab 12/23/14 0411 12/26/14 0546  WBC 5.6 4.1  HGB 12.7* 9.9*  HCT 39.6 31.3*  MCV 79.8 80.5  PLT 304 204   Cardiac Enzymes:  Recent Labs Lab 12/23/14 0411 12/23/14 0811 12/23/14 1501 12/23/14 2030  TROPONINI <0.03 <0.03 <0.03 <0.03   BNP: BNP (last 3 results)  Recent Labs  12/23/14 0811  BNP 21.3    ProBNP (last 3 results) No results for input(s): PROBNP in the last 8760 hours.  CBG:  Recent Labs Lab 12/25/14 2052 12/25/14 2139 12/26/14 0600 12/26/14 0826 12/26/14 1133  GLUCAP 81 145* 262* 215* 145*       Signed:  Olesya Wike  [facility] 12/28/2014, 10:18  AM

## 2015-02-07 ENCOUNTER — Encounter (HOSPITAL_COMMUNITY): Payer: Self-pay | Admitting: Emergency Medicine

## 2015-02-07 ENCOUNTER — Emergency Department (HOSPITAL_COMMUNITY): Payer: Self-pay

## 2015-02-07 ENCOUNTER — Emergency Department (HOSPITAL_COMMUNITY)
Admission: EM | Admit: 2015-02-07 | Discharge: 2015-02-07 | Disposition: A | Discharge status: Home / Self Care | Payer: Self-pay | Attending: Emergency Medicine | Admitting: Emergency Medicine

## 2015-02-07 DIAGNOSIS — S4991XA Unspecified injury of right shoulder and upper arm, initial encounter: Secondary | ICD-10-CM | POA: Insufficient documentation

## 2015-02-07 DIAGNOSIS — Y9289 Other specified places as the place of occurrence of the external cause: Secondary | ICD-10-CM | POA: Insufficient documentation

## 2015-02-07 DIAGNOSIS — Y9301 Activity, walking, marching and hiking: Secondary | ICD-10-CM | POA: Insufficient documentation

## 2015-02-07 DIAGNOSIS — Y998 Other external cause status: Secondary | ICD-10-CM | POA: Insufficient documentation

## 2015-02-07 DIAGNOSIS — F419 Anxiety disorder, unspecified: Secondary | ICD-10-CM | POA: Insufficient documentation

## 2015-02-07 DIAGNOSIS — W010XXA Fall on same level from slipping, tripping and stumbling without subsequent striking against object, initial encounter: Secondary | ICD-10-CM | POA: Insufficient documentation

## 2015-02-07 DIAGNOSIS — Z79899 Other long term (current) drug therapy: Secondary | ICD-10-CM | POA: Insufficient documentation

## 2015-02-07 DIAGNOSIS — E785 Hyperlipidemia, unspecified: Secondary | ICD-10-CM | POA: Insufficient documentation

## 2015-02-07 DIAGNOSIS — I10 Essential (primary) hypertension: Secondary | ICD-10-CM | POA: Insufficient documentation

## 2015-02-07 DIAGNOSIS — G43909 Migraine, unspecified, not intractable, without status migrainosus: Secondary | ICD-10-CM | POA: Insufficient documentation

## 2015-02-07 DIAGNOSIS — E119 Type 2 diabetes mellitus without complications: Secondary | ICD-10-CM | POA: Insufficient documentation

## 2015-02-07 DIAGNOSIS — F329 Major depressive disorder, single episode, unspecified: Secondary | ICD-10-CM | POA: Insufficient documentation

## 2015-02-07 DIAGNOSIS — G8929 Other chronic pain: Secondary | ICD-10-CM | POA: Insufficient documentation

## 2015-02-07 DIAGNOSIS — Z794 Long term (current) use of insulin: Secondary | ICD-10-CM | POA: Insufficient documentation

## 2015-02-07 DIAGNOSIS — F17210 Nicotine dependence, cigarettes, uncomplicated: Secondary | ICD-10-CM | POA: Insufficient documentation

## 2015-02-07 LAB — CBG MONITORING, ED: Glucose-Capillary: 192 mg/dL — ABNORMAL HIGH (ref 65–99)

## 2015-02-07 MED ORDER — CLONAZEPAM 0.5 MG PO TABS
0.5000 mg | ORAL_TABLET | Freq: Once | ORAL | Status: AC
  Administered 2015-02-07: 0.5 mg via ORAL
  Filled 2015-02-07: qty 1

## 2015-02-07 MED ORDER — LOSARTAN POTASSIUM 25 MG PO TABS
25.0000 mg | ORAL_TABLET | Freq: Once | ORAL | Status: AC
  Administered 2015-02-07: 25 mg via ORAL
  Filled 2015-02-07: qty 1

## 2015-02-07 NOTE — ED Notes (Addendum)
C/o anxiety attack since 4pm.  States he has been out of anxiety meds x 2 days.  Denies SI/HI.  Also c/o R shoulder pain.  Pt states he slipped in wet grass and fell x 2 today.  Denies any other pain/injury.

## 2015-02-07 NOTE — ED Notes (Signed)
Water offered as PO challenge.

## 2015-02-07 NOTE — Discharge Instructions (Signed)
It was our pleasure to provide your ER care today - we hope that you feel better.  Follow up with [facility]/primary care doctor tomorrow morning - discuss medication refills with them.  Have your blood pressure rechecked then, as it is high today.  Also follow up with primary care doctor for diabetes management.   For pain, try taking over the counter pain medication as need for symptom relief.   Return to ER if worse, new symptoms, fevers, trouble breathing, medical emergency, other concern.   Shoulder Pain The shoulder is the joint that connects your arms to your body. The bones that form the shoulder joint include the upper arm bone (humerus), the shoulder blade (scapula), and the collarbone (clavicle). The top of the humerus is shaped like a ball and fits into a rather flat socket on the scapula (glenoid cavity). A combination of muscles and strong, fibrous tissues that connect muscles to bones (tendons) support your shoulder joint and hold the ball in the socket. Small, fluid-filled sacs (bursae) are located in different areas of the joint. They act as cushions between the bones and the overlying soft tissues and help reduce friction between the gliding tendons and the bone as you move your arm. Your shoulder joint allows a wide range of motion in your arm. This range of motion allows you to do things like scratch your back or throw a ball. However, this range of motion also makes your shoulder more prone to pain from overuse and injury. Causes of shoulder pain can originate from both injury and overuse and usually can be grouped in the following four categories:  Redness, swelling, and pain (inflammation) of the tendon (tendinitis) or the bursae (bursitis).  Instability, such as a dislocation of the joint.  Inflammation of the joint (arthritis).  Broken bone (fracture). HOME CARE INSTRUCTIONS   Apply ice to the sore area.  Put ice in a plastic bag.  Place a towel between your skin and the  bag.  Leave the ice on for 15-20 minutes, 3-4 times per day for the first 2 days, or as directed by your health care provider.  Stop using cold packs if they do not help with the pain.  If you have a shoulder sling or immobilizer, wear it as long as your caregiver instructs. Only remove it to shower or bathe. Move your arm as little as possible, but keep your hand moving to prevent swelling.  Squeeze a soft ball or foam pad as much as possible to help prevent swelling.  Only take over-the-counter or prescription medicines for pain, discomfort, or fever as directed by your caregiver. SEEK MEDICAL CARE IF:   Your shoulder pain increases, or new pain develops in your arm, hand, or fingers.  Your hand or fingers become cold and numb.  Your pain is not relieved with medicines. SEEK IMMEDIATE MEDICAL CARE IF:   Your arm, hand, or fingers are numb or tingling.  Your arm, hand, or fingers are significantly swollen or turn white or blue. MAKE SURE YOU:   Understand these instructions.  Will watch your condition.  Will get help right away if you are not doing well or get worse.   This information is not intended to replace advice given to you by your health care provider. Make sure you discuss any questions you have with your health care provider.   Document Released: 10/12/2004 Document Revised: 01/23/2014 Document Reviewed: 04/27/2014 Elsevier Interactive Patient Education 2016 [employer].     Hypertension Hypertension,  commonly called high blood pressure, is when the force of blood pumping through your arteries is too strong. Your arteries are the blood vessels that carry blood from your heart throughout your body. A blood pressure reading consists of a higher number over a lower number, such as 110/72. The higher number (systolic) is the pressure inside your arteries when your heart pumps. The lower number (diastolic) is the pressure inside your arteries when your heart relaxes.  Ideally you want your blood pressure below 120/80. Hypertension forces your heart to work harder to pump blood. Your arteries may become narrow or stiff. Having untreated or uncontrolled hypertension can cause heart attack, stroke, kidney disease, and other problems. RISK FACTORS Some risk factors for high blood pressure are controllable. Others are not.  Risk factors you cannot control include:   Race. You may be at higher risk if you are African American.  Age. Risk increases with age.  Gender. Men are at higher risk than women before age 40 years. After age 46, women are at higher risk than men. Risk factors you can control include:  Not getting enough exercise or physical activity.  Being overweight.  Getting too much fat, sugar, calories, or salt in your diet.  Drinking too much alcohol. SIGNS AND SYMPTOMS Hypertension does not usually cause signs or symptoms. Extremely high blood pressure (hypertensive crisis) may cause headache, anxiety, shortness of breath, and nosebleed. DIAGNOSIS To check if you have hypertension, your health care provider will measure your blood pressure while you are seated, with your arm held at the level of your heart. It should be measured at least twice using the same arm. Certain conditions can cause a difference in blood pressure between your right and left arms. A blood pressure reading that is higher than normal on one occasion does not mean that you need treatment. If it is not clear whether you have high blood pressure, you may be asked to return on a different day to have your blood pressure checked again. Or, you may be asked to monitor your blood pressure at home for 1 or more weeks. TREATMENT Treating high blood pressure includes making lifestyle changes and possibly taking medicine. Living a healthy lifestyle can help lower high blood pressure. You may need to change some of your habits. Lifestyle changes may include:  Following the DASH diet.  This diet is high in fruits, vegetables, and whole grains. It is low in salt, red meat, and added sugars.  Keep your sodium intake below 2,300 mg per day.  Getting at least 30-45 minutes of aerobic exercise at least 4 times per week.  Losing weight if necessary.  Not smoking.  Limiting alcoholic beverages.  Learning ways to reduce stress. Your health care provider may prescribe medicine if lifestyle changes are not enough to get your blood pressure under control, and if one of the following is true:  You are 89-35 years of age and your systolic blood pressure is above 140.  You are 55 years of age or older, and your systolic blood pressure is above 150.  Your diastolic blood pressure is above 90.  You have diabetes, and your systolic blood pressure is over 140 or your diastolic blood pressure is over 90.  You have kidney disease and your blood pressure is above 140/90.  You have heart disease and your blood pressure is above 140/90. Your personal target blood pressure may vary depending on your medical conditions, your age, and other factors. HOME CARE INSTRUCTIONS  Have your blood pressure rechecked as directed by your health care provider.   Take medicines only as directed by your health care provider. Follow the directions carefully. Blood pressure medicines must be taken as prescribed. The medicine does not work as well when you skip doses. Skipping doses also puts you at risk for problems.  Do not smoke.   Monitor your blood pressure at home as directed by your health care provider. SEEK MEDICAL CARE IF:   You think you are having a reaction to medicines taken.  You have recurrent headaches or feel dizzy.  You have swelling in your ankles.  You have trouble with your vision. SEEK IMMEDIATE MEDICAL CARE IF:  You develop a severe headache or confusion.  You have unusual weakness, numbness, or feel faint.  You have severe chest or abdominal pain.  You vomit  repeatedly.  You have trouble breathing. MAKE SURE YOU:   Understand these instructions.  Will watch your condition.  Will get help right away if you are not doing well or get worse.   This information is not intended to replace advice given to you by your health care provider. Make sure you discuss any questions you have with your health care provider.   Document Released: 01/02/2005 Document Revised: 05/19/2014 Document Reviewed: 10/25/2012 Elsevier Interactive Patient Education 2016 Else[employer] Inc[employer]   Generalized Anxiety Disorder Generalized anxiety disorder (GAD) is a mental disorder. It interferes with life functions, including relationships, work, and school. GAD is different from normal anxiety, which everyone experiences at some point in their lives in response to specific life events and activities. Normal anxiety actually helps [place] prepare for and get through these life events and activities. Normal anxiety goes away after the event or activity is over.  GAD causes anxiety that is not necessarily related to specific events or activities. It also causes excess anxiety in proportion to specific events or activities. The anxiety associated with GAD is also difficult to control. GAD can vary from mild to severe. People with severe GAD can have intense waves of anxiety with physical symptoms (panic attacks).  SYMPTOMS The anxiety and worry associated with GAD are difficult to control. This anxiety and worry are related to many life events and activities and also occur more days than not for 6 months or longer. People with GAD also have three or more of the following symptoms (one or more in children):  Restlessness.   Fatigue.  Difficulty concentrating.   Irritability.  Muscle tension.  Difficulty sleeping or unsatisfying sleep. DIAGNOSIS GAD is diagnosed through an assessment by your health care provider. Your health care provider will ask you questions aboutyour  mood,physical symptoms, and events in your life. Your health care provider may ask you about your medical history and use of alcohol or drugs, including prescription medicines. Your health care provider may also do a physical exam and blood tests. Certain medical conditions and the use of certain substances can cause symptoms similar to those associated with GAD. Your health care provider may refer you to a mental health specialist for further evaluation. TREATMENT The following therapies are usually used to treat GAD:   Medication. Antidepressant medication usually is prescribed for long-term daily control. Antianxiety medicines may be added in severe cases, especially when panic attacks occur.   Talk therapy (psychotherapy). Certain types of talk therapy can be helpful in treating GAD by providing support, education, and guidance. A form of talk therapy called cognitive behavioral therapy can teach you healthy ways  to think about and react to daily life events and activities.  Stress managementtechniques. These include yoga, meditation, and exercise and can be very helpful when they are practiced regularly. A mental health specialist can help determine which treatment is best for you. Some people see improvement with one therapy. However, other people require a combination of therapies.   This information is not intended to replace advice given to you by your health care provider. Make sure you discuss any questions you have with your health care provider.   Document Released: 04/29/2012 Document Revised: 01/23/2014 Document Reviewed: 04/29/2012 Elsevier Interactive Patient Education [employer]

## 2015-02-07 NOTE — ED Provider Notes (Signed)
CSN: [id number]     Arrival date & time 02/07/15  1850 History   First MD Initiated Contact with Patient 02/07/15 2149     Chief Complaint  Patient presents with  . Anxiety  . Shoulder Pain  . Fall     (Consider location/radiation/quality/duration/timing/severity/associated sxs/prior Treatment) Patient is a 55 y.o. male presenting with anxiety, shoulder pain, and fall. The history is provided by the patient.  Anxiety Pertinent negatives include no chest pain, no abdominal pain, no headaches and no shortness of breath.  Shoulder Pain Associated symptoms: no back pain, no fever and no neck pain   Fall Pertinent negatives include no chest pain, no abdominal pain, no headaches and no shortness of breath.  Patient w hx anxiety, DM, indicates he has been out of all his pills, including klonopin and bp meds for the past week. States is feeling anxious. Also states slip and fall on wet ground last pm while walking, and c/o right shoulder pain. Right shoulder pain mild, persistent.  No radicular pain or numbness/weaknes. Has remained ambulatory w steady gait.  No head injury or loc with fall. Indicates was mechanical fall/slip.  Denies neck or back pain. Denies other pain or injury. States otherwise recent health at baseline. Eating and drinking normally. No nvd. No polyuria or polydipsia. No fever or chills.      Past Medical History  Diagnosis Date  . Depression   . Anxiety   . Anxiety   . Hypertension   . Hyperlipemia   . Type II diabetes mellitus (HCC)   . Migraine     "last one was ~ 4 yr ago" (12/23/2014)  . Seizures (HCC)     "related to pills for anxiety; if I don't take the pills I'm suppose to take I'll have them" (12/23/2014)  . Chronic lower back pain    Past Surgical History  Procedure Laterality Date  . No past surgeries     No family history on file. Social History  Substance Use Topics  . Smoking status: Current Every Day Smoker -- 0.10 packs/day for 32 years   Types: Cigarettes  . Smokeless tobacco: Never Used  . Alcohol Use: 1.2 oz/week    2 Cans of beer per week    Review of Systems  Constitutional: Negative for fever and chills.  HENT: Negative for sore throat.   Eyes: Negative for redness.  Respiratory: Negative for shortness of breath.   Cardiovascular: Negative for chest pain.  Gastrointestinal: Negative for vomiting, abdominal pain and diarrhea.  Endocrine: Negative for polyuria.  Genitourinary: Negative for flank pain.  Musculoskeletal: Negative for back pain and neck pain.  Skin: Negative for wound.  Neurological: Negative for weakness, numbness and headaches.  Hematological: Does not bruise/bleed easily.  Psychiatric/Behavioral: Negative for confusion.      Allergies  Ibuprofen; Ibuprofen; Tylenol; and Tylenol  Home Medications   Prior to Admission medications   Medication Sig Start Date End Date Taking? Authorizing Provider  atorvastatin (LIPITOR) 20 MG tablet Take 20 mg by mouth daily.    Historical Provider, MD  chlorpheniramine-HYDROcodone (TUSSIONEX) 10-8 MG/5ML SUER Take 5 mLs by mouth at bedtime as needed for cough. 12/26/14   Kathlen Mody, MD  clonazePAM (KLONOPIN) 0.5 MG tablet Take 1 tablet (0.5 mg total) by mouth daily. 12/26/14   Kathlen Mody, MD  feeding supplement, GLUCERNA SHAKE, (GLUCERNA SHAKE) LIQD Take 237 mLs by mouth 3 (three) times daily between meals. 12/26/14   Kathlen Mody, MD  folic acid (FOLVITE) 1  MG tablet Take 1 tablet (1 mg total) by mouth daily. 12/26/14   Kathlen Mody, MD  insulin glargine (LANTUS) 100 unit/mL SOPN Inject 0.18 mLs (18 Units total) into the skin at bedtime. 12/26/14   Kathlen Mody, MD  Insulin Pen Needle (PEN NEEDLES 29GX1/2") 29G X MISC 90 pens by Does not apply route daily. 12/26/14   Kathlen Mody, MD  losartan (COZAAR) 25 MG tablet Take 1 tablet (25 mg total) by mouth daily. 12/26/14   Kathlen Mody, MD  Multiple Vitamin (MULTIVITAMIN WITH MINERALS) TABS tablet Take 1  tablet by mouth daily. 12/26/14   Kathlen Mody, MD  thiamine 100 MG tablet Take 1 tablet (100 mg total) by mouth daily. 12/26/14   Kathlen Mody, MD   BP 195/87 mmHg  Pulse 77  Temp(Src) 99.2 F (37.3 C) (Oral)  Resp 18  SpO2 99% Physical Exam  Constitutional: He is oriented to person, place, and time. He appears well-developed and well-nourished. No distress.  HENT:  Head: Atraumatic.  Mouth/Throat: Oropharynx is clear and moist.  Eyes: Conjunctivae are normal. Pupils are equal, round, and reactive to light. No scleral icterus.  Neck: Normal range of motion. Neck supple. No tracheal deviation present.  Cardiovascular: Normal rate, regular rhythm, normal heart sounds and intact distal pulses.  Exam reveals no gallop and no friction rub.   No murmur heard. Pulmonary/Chest: Effort normal and breath sounds normal. No accessory muscle usage. No respiratory distress.  Abdominal: Soft. Bowel sounds are normal. He exhibits no distension. There is no tenderness.  Musculoskeletal: Normal range of motion.  Tenderness right shoulder. No deformity, no sts. Distal pulses palp bil.  CTLS spine, non tender, aligned, no step off.   Neurological: He is alert and oriented to person, place, and time.  Ambulates w steady gait. Moves bil shoulders/full rom. stren 5/5 bil , sens grossly intact.   Skin: Skin is warm and dry. He is not diaphoretic.  Psychiatric: He has a normal mood and affect.  Nursing note and vitals reviewed.   ED Course  Procedures (including critical care time) Labs Review    Results for orders placed or performed during the hospital encounter of 02/07/15  POC CBG, ED  Result Value Ref Range   Glucose-Capillary 192 (H) 65 - 99 mg/dL   Dg Shoulder Right  1/61/0960  CLINICAL DATA:  Fall right shoulder pain EXAM: RIGHT SHOULDER - 2+ VIEW COMPARISON:  None. FINDINGS: There is no evidence of fracture or dislocation. There is no evidence of arthropathy or other focal bone  abnormality. Soft tissues are unremarkable. IMPRESSION: Negative. Electronically Signed   By: Esperanza Heir M.D.   On: 02/07/2015 20:02       I have personally reviewed and evaluated these images and lab results as part of my medical decision-making.    MDM   xrays from triage - neg for acute process.  Pt requests his klonopin, indicates out.  Pt indicates walked here.  Will give dose in ED.  Klonopin po.  Pt indicates gets meds filled at [facility] and can go there in AM.  Pt indicates has adequate of his insulin, but out of other meds.    Will give dose of his bp med as well as bp high.   Pt ambulatory about ED with steady gait. Tolerating po fluids well.  Pt currently appears stable for d/c.  Return precautions provided.     Cathren Laine, MD 02/07/15 2240

## 2015-02-17 ENCOUNTER — Other Ambulatory Visit: Payer: Self-pay

## 2015-02-17 ENCOUNTER — Emergency Department (HOSPITAL_COMMUNITY)
Admission: EM | Admit: 2015-02-17 | Discharge: 2015-02-18 | Disposition: A | Discharge status: Home / Self Care | Payer: Self-pay | Attending: Emergency Medicine | Admitting: Emergency Medicine

## 2015-02-17 ENCOUNTER — Encounter (HOSPITAL_COMMUNITY): Payer: Self-pay | Admitting: *Deleted

## 2015-02-17 ENCOUNTER — Emergency Department (HOSPITAL_COMMUNITY): Payer: Self-pay

## 2015-02-17 DIAGNOSIS — G8929 Other chronic pain: Secondary | ICD-10-CM | POA: Insufficient documentation

## 2015-02-17 DIAGNOSIS — E119 Type 2 diabetes mellitus without complications: Secondary | ICD-10-CM | POA: Insufficient documentation

## 2015-02-17 DIAGNOSIS — I10 Essential (primary) hypertension: Secondary | ICD-10-CM | POA: Insufficient documentation

## 2015-02-17 DIAGNOSIS — I159 Secondary hypertension, unspecified: Secondary | ICD-10-CM | POA: Insufficient documentation

## 2015-02-17 DIAGNOSIS — F329 Major depressive disorder, single episode, unspecified: Secondary | ICD-10-CM | POA: Insufficient documentation

## 2015-02-17 DIAGNOSIS — Z79899 Other long term (current) drug therapy: Secondary | ICD-10-CM | POA: Insufficient documentation

## 2015-02-17 DIAGNOSIS — F419 Anxiety disorder, unspecified: Secondary | ICD-10-CM | POA: Insufficient documentation

## 2015-02-17 DIAGNOSIS — E785 Hyperlipidemia, unspecified: Secondary | ICD-10-CM | POA: Insufficient documentation

## 2015-02-17 DIAGNOSIS — G43909 Migraine, unspecified, not intractable, without status migrainosus: Secondary | ICD-10-CM | POA: Insufficient documentation

## 2015-02-17 DIAGNOSIS — Z794 Long term (current) use of insulin: Secondary | ICD-10-CM | POA: Insufficient documentation

## 2015-02-17 DIAGNOSIS — F17210 Nicotine dependence, cigarettes, uncomplicated: Secondary | ICD-10-CM | POA: Insufficient documentation

## 2015-02-17 LAB — BASIC METABOLIC PANEL
Anion gap: 10 (ref 5–15)
BUN: 10 mg/dL (ref 6–20)
CHLORIDE: 104 mmol/L (ref 101–111)
CO2: 26 mmol/L (ref 22–32)
CREATININE: 0.82 mg/dL (ref 0.61–1.24)
Calcium: 9.3 mg/dL (ref 8.9–10.3)
GFR calc Af Amer: >60 mL/min (ref >60)
GFR calc non Af Amer: >60 mL/min (ref >60)
Glucose, Bld: 290 mg/dL — ABNORMAL HIGH (ref 65–99)
Potassium: 3.8 mmol/L (ref 3.5–5.1)
Sodium: 140 mmol/L (ref 135–145)

## 2015-02-17 LAB — CBC
HCT: 35.6 % — ABNORMAL LOW (ref 39.0–52.0)
Hemoglobin: 11.4 g/dL — ABNORMAL LOW (ref 13.0–17.0)
MCH: 25.9 pg — AB (ref 26.0–34.0)
MCHC: 32 g/dL (ref 30.0–36.0)
MCV: 80.7 fL (ref 78.0–100.0)
PLATELETS: 246 10*3/uL (ref 150–400)
RBC: 4.41 MIL/uL (ref 4.22–5.81)
RDW: 12.6 % (ref 11.5–15.5)
WBC: 5.3 10*3/uL (ref 4.0–10.5)

## 2015-02-17 LAB — I-STAT TROPONIN, ED: Troponin i, poc: 0 ng/mL (ref 0.00–0.08)

## 2015-02-17 NOTE — ED Notes (Signed)
Pt states he has had a headache all day and has had two nosebleeds, thinks his blood pressure may be running high. Pt says he feels like his heart races, lasting 30 minutes at a time, on an off today.

## 2015-02-18 MED ORDER — AMLODIPINE BESYLATE 10 MG PO TABS: 10.0000 mg | ORAL_TABLET | Freq: Every day | ORAL | Status: DC

## 2015-02-18 MED ORDER — LISINOPRIL 10 MG PO TABS
10.0000 mg | ORAL_TABLET | Freq: Once | ORAL | Status: AC
  Administered 2015-02-18: 10 mg via ORAL
  Filled 2015-02-18: qty 1

## 2015-02-18 MED ORDER — AMLODIPINE BESYLATE 5 MG PO TABS
10.0000 mg | ORAL_TABLET | Freq: Once | ORAL | Status: AC
  Administered 2015-02-18: 10 mg via ORAL
  Filled 2015-02-18: qty 2

## 2015-02-18 MED ORDER — LISINOPRIL 20 MG PO TABS: 20.0000 mg | ORAL_TABLET | Freq: Every day | ORAL | Status: DC

## 2015-02-18 MED ORDER — METOCLOPRAMIDE HCL 10 MG PO TABS
10.0000 mg | ORAL_TABLET | Freq: Once | ORAL | Status: AC
Start: 2015-02-18 — End: 2015-02-18
  Administered 2015-02-18: 10 mg via ORAL
  Filled 2015-02-18: qty 1

## 2015-02-18 NOTE — Discharge Instructions (Signed)
How to Take Your Blood Pressure Mike [name], Take your blood pressure medication everyday and see your primary care doctor within 3 days for close follow up. If symptoms worsen, come back to the ED immediately. Thank you. HOW DO I GET A BLOOD PRESSURE MACHINE?  You can buy an electronic home blood pressure machine at your local pharmacy. Insurance will sometimes cover the cost if you have a prescription.  Ask your doctor what type of machine is best for you. There are different machines for your arm and your wrist.  If you decide to buy a machine to check your blood pressure on your arm, first check the size of your arm so you can buy the right size cuff. To check the size of your arm:   Use a measuring tape that shows both inches and centimeters.   Wrap the measuring tape around the upper-middle part of your arm. You may need someone to help you measure.   Write down your arm measurement in both inches and centimeters.   To measure your blood pressure correctly, it is important to have the right size cuff.   If your arm is up to 13 inches (up to 34 centimeters), get an adult cuff size.  If your arm is 13 to 17 inches (35 to 44 centimeters), get a large adult cuff size.    If your arm is 17 to 20 inches (45 to 52 centimeters), get an adult thigh cuff.  WHAT DO THE NUMBERS MEAN?   There are two numbers that make up your blood pressure. For example: 120/80.  The first number (120 in our example) is called the "systolic pressure." It is a measure of the pressure in your blood vessels when your heart is pumping blood.  The second number (80 in our example) is called the "diastolic pressure." It is a measure of the pressure in your blood vessels when your heart is resting between beats.  Your doctor will tell you what your blood pressure should be. WHAT SHOULD I DO BEFORE I CHECK MY BLOOD PRESSURE?   Try to rest or relax for at least 30 minutes before you check your blood  pressure.  Do not smoke.  Do not have any drinks with caffeine, such as:  Soda.  Coffee.  Tea.  Check your blood pressure in a quiet room.  Sit down and stretch out your arm on a table. Keep your arm at about the level of your heart. Let your arm relax.  Make sure that your legs are not crossed. HOW DO I CHECK MY BLOOD PRESSURE?  Follow the directions that came with your machine.  Make sure you remove any tight-fitting clothing from your arm or wrist. Wrap the cuff around your upper arm or wrist. You should be able to fit a finger between the cuff and your arm. If you cannot fit a finger between the cuff and your arm, it is too tight and should be removed and rewrapped.  Some units require you to manually pump up the arm cuff.  Automatic units inflate the cuff when you press a button.  Cuff deflation is automatic in both models.  After the cuff is inflated, the unit measures your blood pressure and pulse. The readings are shown on a monitor. Hold still and breathe normally while the cuff is inflated.  Getting a reading takes less than a minute.  Some models store readings in a memory. Some provide a printout of readings. If your machine does  not store your readings, keep a written record.  Take readings with you to your next visit with your doctor.   This information is not intended to replace advice given to you by your health care provider. Make sure you discuss any questions you have with your health care provider.   Document Released: 12/16/2007 Document Revised: 01/23/2014 Document Reviewed: 02/27/2013 Elsevier Interactive Patient Education [employer]

## 2015-02-18 NOTE — ED Provider Notes (Signed)
CSN: [id number]     Arrival date & time 02/17/15  1951 History  By signing my name below, I, Freida Busman, attest that this documentation has been prepared under the direction and in the presence of Tomasita Crumble, MD . Electronically Signed: Freida Busman, Scribe. 02/18/2015. 12:39 AM.   Chief Complaint  Patient presents with  . Headache    The history is provided by the patient. No language interpreter was used.    HPI Comments:  Mike [name] is a 55 y.o. male with a history of migraine and HTN, who presents to the Emergency Department complaining of HA x 2 days. He reports moderate diffuse pain throughout his head. Pt also notes associated HTN; states he recently ran out of his BP meds- Lisinopril.  No alleviating factors noted.   Past Medical History  Diagnosis Date  . Depression   . Anxiety   . Anxiety   . Hypertension   . Hyperlipemia   . Type II diabetes mellitus (HCC)   . Migraine     "last one was ~ 4 yr ago" (12/23/2014)  . Seizures (HCC)     "related to pills for anxiety; if I don't take the pills I'm suppose to take I'll have them" (12/23/2014)  . Chronic lower back pain    Past Surgical History  Procedure Laterality Date  . No past surgeries     No family history on file. Social History  Substance Use Topics  . Smoking status: Current Every Day Smoker -- 0.10 packs/day for 32 years    Types: Cigarettes  . Smokeless tobacco: Never Used  . Alcohol Use: 1.2 oz/week    2 Cans of beer per week    Review of Systems  10 systems reviewed and all are negative for acute change except as noted in the HPI.   Allergies  Ibuprofen; Ibuprofen; Tylenol; and Tylenol  Home Medications   Prior to Admission medications   Medication Sig Start Date End Date Taking? Authorizing Provider  atorvastatin (LIPITOR) 20 MG tablet Take 20 mg by mouth daily.   Yes Historical Provider, MD  chlorpheniramine-HYDROcodone (TUSSIONEX) 10-8 MG/5ML SUER Take 5 mLs by mouth at bedtime as  needed for cough. 12/26/14  Yes Kathlen Mody, MD  clonazePAM (KLONOPIN) 0.5 MG tablet Take 1 tablet (0.5 mg total) by mouth daily. 12/26/14  Yes Kathlen Mody, MD  folic acid (FOLVITE) 1 MG tablet Take 1 tablet (1 mg total) by mouth daily. 12/26/14  Yes Kathlen Mody, MD  insulin glargine (LANTUS) 100 unit/mL SOPN Inject 0.18 mLs (18 Units total) into the skin at bedtime. Patient taking differently: Inject 8-15 Units into the skin at bedtime.  12/26/14  Yes Kathlen Mody, MD  Insulin Pen Needle (PEN NEEDLES 29GX1/2") 29G X MISC 90 pens by Does not apply route daily. 12/26/14  Yes Kathlen Mody, MD  losartan (COZAAR) 25 MG tablet Take 1 tablet (25 mg total) by mouth daily. 12/26/14  Yes Kathlen Mody, MD  Multiple Vitamin (MULTIVITAMIN WITH MINERALS) TABS tablet Take 1 tablet by mouth daily. 12/26/14  Yes Kathlen Mody, MD  thiamine 100 MG tablet Take 1 tablet (100 mg total) by mouth daily. 12/26/14  Yes Kathlen Mody, MD   BP 177/89 mmHg  Pulse 64  Temp(Src) 98.3 F (36.8 C) (Oral)  Resp 19  Ht  (1.93 m)  Wt 170 lb (77.111 kg)  BMI 20.70 kg/m2  SpO2 97% Physical Exam  Constitutional: He is oriented to person, place, and time. Vital signs  are normal. He appears well-developed and well-nourished.  Non-toxic appearance. He does not appear ill. No distress.  HENT:  Head: Normocephalic and atraumatic.  Nose: Nose normal.  Mouth/Throat: Oropharynx is clear and moist. No oropharyngeal exudate.  Eyes: Conjunctivae and EOM are normal. Pupils are equal, round, and reactive to light. No scleral icterus.  Neck: Normal range of motion. Neck supple. No tracheal deviation, no edema, no erythema and normal range of motion present. No thyroid mass and no thyromegaly present.  Cardiovascular: Normal rate, regular rhythm, S1 normal, S2 normal, normal heart sounds, intact distal pulses and normal pulses.  Exam reveals no gallop and no friction rub.   No murmur heard. Pulmonary/Chest: Effort normal and  breath sounds normal. No respiratory distress. He has no wheezes. He has no rhonchi. He has no rales.  Abdominal: Soft. Normal appearance and bowel sounds are normal. He exhibits no distension, no ascites and no mass. There is no hepatosplenomegaly. There is no tenderness. There is no rebound, no guarding and no CVA tenderness.  Musculoskeletal: Normal range of motion. He exhibits no edema or tenderness.  Lymphadenopathy:    He has no cervical adenopathy.  Neurological: He is alert and oriented to person, place, and time. He has normal strength. No cranial nerve deficit or sensory deficit.  Skin: Skin is warm, dry and intact. No petechiae and no rash noted. He is not diaphoretic. No erythema. No pallor.  Psychiatric: He has a normal mood and affect. His behavior is normal. Judgment normal.  Nursing note and vitals reviewed.   ED Course  Procedures   DIAGNOSTIC STUDIES:  Oxygen Saturation is 99% on RA, normal by my interpretation.    COORDINATION OF CARE:  12:32 AM Will discharge with Rx for lisinopril. Discussed treatment plan with pt at bedside and pt agreed to plan.  Labs Review Labs Reviewed  BASIC METABOLIC PANEL - Abnormal; Notable for the following:    Glucose, Bld 290 (*)    All other components within normal limits  CBC - Abnormal; Notable for the following:    Hemoglobin 11.4 (*)    HCT 35.6 (*)    MCH 25.9 (*)    All other components within normal limits  I-STAT TROPOININ, ED    Imaging Review Dg Chest 2 View  02/17/2015  CLINICAL DATA:  Shortness of breath and difficulty breathing for 1 day EXAM: CHEST  2 VIEW COMPARISON:  Radiographs 12/24/2014 FINDINGS: The cardiomediastinal contours are unchanged with mild cardiomegaly. Nipple shadows noted. Pulmonary vasculature is normal. No consolidation, pleural effusion, or pneumothorax. No acute osseous abnormalities are seen. IMPRESSION: Stable mild cardiomegaly.  No acute process. Electronically Signed   By: Rubye Oaks  M.D.   On: 02/17/2015 20:44   I have personally reviewed and evaluated these images and lab results as part of my medical decision-making.   EKG Interpretation   Date/Time:  Wednesday February 17 2015 23:12:33 EST Ventricular Rate:  70 PR Interval:  150 QRS Duration: 90 QT Interval:  400 QTC Calculation: 432 R Axis:   78 Text Interpretation:  Sinus rhythm Left ventricular hypertrophy ST  elevation, consider anterolateral injury When compared with ECG of  12/25/2014, No significant change was found Confirmed by [facility]  MD, DAVID  ([id number]) on 02/17/2015 11:26:38 PM      MDM   Final diagnoses:  None    Patient presents to the emergency department for headache in the setting of running out of his blood pressure medications. He states he takes lisinopril  20 mg which was given to him. I added amlodipine 10 mg as well due to his blood pressure being 180/90. Patient will be given prescriptions, primary care follow-up was advised. He appears well and in no acute distress, he is asymptomatic from his high blood pressure. Vital signs remain within his normal limits and he is safe for discharge.   I personally performed the services described in this documentation, which was scribed in my presence. The recorded information has been reviewed and is accurate.      Tomasita Crumble, MD 02/18/15 [phone]

## 2015-02-18 NOTE — ED Notes (Signed)
Pt given a sandwich, drink, and bus pass. Departed in NAD.

## 2016-02-14 ENCOUNTER — Encounter (HOSPITAL_COMMUNITY): Payer: Self-pay

## 2016-02-14 DIAGNOSIS — Y929 Unspecified place or not applicable: Secondary | ICD-10-CM | POA: Insufficient documentation

## 2016-02-14 DIAGNOSIS — Z794 Long term (current) use of insulin: Secondary | ICD-10-CM | POA: Insufficient documentation

## 2016-02-14 DIAGNOSIS — I10 Essential (primary) hypertension: Secondary | ICD-10-CM | POA: Insufficient documentation

## 2016-02-14 DIAGNOSIS — W228XXA Striking against or struck by other objects, initial encounter: Secondary | ICD-10-CM | POA: Insufficient documentation

## 2016-02-14 DIAGNOSIS — Z79899 Other long term (current) drug therapy: Secondary | ICD-10-CM | POA: Insufficient documentation

## 2016-02-14 DIAGNOSIS — E119 Type 2 diabetes mellitus without complications: Secondary | ICD-10-CM | POA: Insufficient documentation

## 2016-02-14 DIAGNOSIS — S0501XA Injury of conjunctiva and corneal abrasion without foreign body, right eye, initial encounter: Secondary | ICD-10-CM | POA: Insufficient documentation

## 2016-02-14 DIAGNOSIS — Y939 Activity, unspecified: Secondary | ICD-10-CM | POA: Insufficient documentation

## 2016-02-14 DIAGNOSIS — Y999 Unspecified external cause status: Secondary | ICD-10-CM | POA: Insufficient documentation

## 2016-02-14 DIAGNOSIS — F17210 Nicotine dependence, cigarettes, uncomplicated: Secondary | ICD-10-CM | POA: Insufficient documentation

## 2016-02-14 NOTE — ED Triage Notes (Signed)
Pt states fell into bushes. Pt states poked in R eye. Pt states unable to see out of R eye. Pt with good pupillary reflex, pt able to track light with R eye. No obvious injury/trauma to eye.

## 2016-02-15 ENCOUNTER — Emergency Department (HOSPITAL_COMMUNITY)
Admission: EM | Admit: 2016-02-15 | Discharge: 2016-02-15 | Disposition: A | Discharge status: Home / Self Care | Payer: Self-pay | Attending: Emergency Medicine | Admitting: Emergency Medicine

## 2016-02-15 DIAGNOSIS — S0501XA Injury of conjunctiva and corneal abrasion without foreign body, right eye, initial encounter: Secondary | ICD-10-CM

## 2016-02-15 MED ORDER — FLUORESCEIN SODIUM 0.6 MG OP STRP
1.0000 | ORAL_STRIP | Freq: Once | OPHTHALMIC | Status: AC
  Administered 2016-02-15: 1 via OPHTHALMIC
  Filled 2016-02-15: qty 1

## 2016-02-15 MED ORDER — ERYTHROMYCIN 5 MG/GM OP OINT: TOPICAL_OINTMENT | OPHTHALMIC | 0 refills | Status: DC

## 2016-02-15 MED ORDER — TETRACAINE HCL 0.5 % OP SOLN
1.0000 [drp] | Freq: Once | OPHTHALMIC | Status: AC
  Administered 2016-02-15: 1 [drp] via OPHTHALMIC
  Filled 2016-02-15: qty 2

## 2016-02-15 NOTE — ED Provider Notes (Signed)
[facility]-EMERGENCY DEPT Provider Note   CSN: [id number] Arrival date & time: 02/14/16  2223     History   Chief Complaint Chief Complaint  Patient presents with  . Eye Pain    HPI Mike [name] is a 56 y.o. male presenting after falling in the bushes and having a branch hit his right eye. He denies any pain at this time. He explains that he has been having difficulty seeing when he is out in the sun and bright light. Otherwise no visual problems. Patient has no other complaints.  HPI  Past Medical History:  Diagnosis Date  . Anxiety   . Anxiety   . Chronic lower back pain   . Depression   . Hyperlipemia   . Hypertension   . Migraine    "last one was ~ 4 yr ago" (12/23/2014)  . Seizures (HCC)    "related to pills for anxiety; if I don't take the pills I'm suppose to take I'll have them" (12/23/2014)  . Type II diabetes mellitus [facility])     Patient Active Problem List   Diagnosis Date Noted  . Protein-calorie malnutrition, severe 12/24/2014  . Chest pain, pleuritic 12/23/2014  . Homelessness 12/23/2014  . Hypertensive urgency 12/23/2014  . DM (diabetes mellitus) (HCC) 12/23/2014  . Intermittent palpitations 12/23/2014  . Tobacco abuse 12/23/2014  . Pleuritic chest pain 12/23/2014  . Abnormal EKG 12/23/2014  . Cough   . Diabetes mellitus with complication [facility])     Past Surgical History:  Procedure Laterality Date  . NO PAST SURGERIES         Home Medications    Prior to Admission medications   Medication Sig Start Date End Date Taking? Authorizing Provider  amLODipine (NORVASC) 10 MG tablet Take 1 tablet (10 mg total) by mouth daily. 02/18/15   Tomasita Crumble, MD  atorvastatin (LIPITOR) 20 MG tablet Take 20 mg by mouth daily.    Historical Provider, MD  chlorpheniramine-HYDROcodone (TUSSIONEX) 10-8 MG/5ML SUER Take 5 mLs by mouth at bedtime as needed for cough. 12/26/14   Kathlen Mody, MD  clonazePAM (KLONOPIN) 0.5 MG tablet Take 1 tablet (0.5 mg total) by mouth  daily. 12/26/14   Kathlen Mody, MD  erythromycin ophthalmic ointment Place a 1/2 inch ribbon of ointment into the lower eyelid. 02/15/16   Georgiana Shore, PA-C  folic acid (FOLVITE) 1 MG tablet Take 1 tablet (1 mg total) by mouth daily. 12/26/14   Kathlen Mody, MD  insulin glargine (LANTUS) 100 unit/mL SOPN Inject 0.18 mLs (18 Units total) into the skin at bedtime. Patient taking differently: Inject 8-15 Units into the skin at bedtime.  12/26/14   Kathlen Mody, MD  Insulin Pen Needle (PEN NEEDLES 29GX1/2") 29G X MISC 90 pens by Does not apply route daily. 12/26/14   Kathlen Mody, MD  lisinopril (PRINIVIL,ZESTRIL) 20 MG tablet Take 1 tablet (20 mg total) by mouth daily. 02/18/15   Tomasita Crumble, MD  Multiple Vitamin (MULTIVITAMIN WITH MINERALS) TABS tablet Take 1 tablet by mouth daily. 12/26/14   Kathlen Mody, MD  thiamine 100 MG tablet Take 1 tablet (100 mg total) by mouth daily. 12/26/14   Kathlen Mody, MD    Family History History reviewed. No pertinent family history.  Social History Social History  Substance Use Topics  . Smoking status: Current Every Day Smoker    Packs/day: 0.10    Years: 32.00    Types: Cigarettes  . Smokeless tobacco: Never Used  . Alcohol use 1.2 oz/week  2 Cans of beer per week     Allergies   Ibuprofen; Ibuprofen; Tylenol [acetaminophen]; and Tylenol [acetaminophen]   Review of Systems Review of Systems  Constitutional: Negative for chills and fever.  HENT: Negative for ear pain and sore throat.   Eyes: Positive for photophobia and visual disturbance. Negative for pain, discharge, redness and itching.  Respiratory: Negative for cough and shortness of breath.   Cardiovascular: Negative for chest pain and palpitations.  Gastrointestinal: Negative for abdominal pain, nausea and vomiting.  Genitourinary: Negative for dysuria and hematuria.  Musculoskeletal: Negative for arthralgias, back pain, gait problem, neck pain and neck stiffness.  Skin:  Negative for color change, pallor and rash.  Neurological: Negative for dizziness, seizures, syncope, light-headedness and headaches.  All other systems reviewed and are negative.    Physical Exam Updated Vital Signs BP 167/75 (BP Location: Left Arm)   Pulse 61   Temp 98.4 F (36.9 C) (Oral)   Resp 16   SpO2 100%   Physical Exam  Constitutional: He appears well-developed and well-nourished. No distress.  Afebrile, nontoxic appearing sitting comfortably in chair in no acute distress.  HENT:  Head: Normocephalic and atraumatic.  Eyes: Conjunctivae and EOM are normal. Pupils are equal, round, and reactive to light. Right eye exhibits no discharge. Left eye exhibits no discharge. No scleral icterus.  Patient has full range of motion of extraocular muscles. No pain with eye movement. No pain the eyeball. Patient reports that he simply doesn't see well when he is out in the bright sun. he has not tried wearing sunglasses or anything to remedy the problem.    Neck: Normal range of motion. Neck supple.  Cardiovascular: Normal rate, regular rhythm and normal heart sounds.   No murmur heard. Pulmonary/Chest: Effort normal. No respiratory distress.  Abdominal: Soft. There is no tenderness.  Musculoskeletal: Normal range of motion. He exhibits no edema.  Neurological: He is alert.  Skin: Skin is warm and dry.  Psychiatric: He has a normal mood and affect.  Nursing note and vitals reviewed.    ED Treatments / Results  Labs (all labs ordered are listed, but only abnormal results are displayed) Labs Reviewed - No data to display  EKG  EKG Interpretation None       Radiology No results found.  Procedures Procedures (including critical care time)  Medications Ordered in ED Medications  tetracaine (PONTOCAINE) 0.5 % ophthalmic solution 1 drop (1 drop Right Eye Given 02/15/16 0141)  fluorescein ophthalmic strip 1 strip (1 strip Right Eye Given 02/15/16 0141)  fluorescein  ophthalmic strip 1 strip (1 strip Right Eye Given 02/15/16 0237)     Initial Impression / Assessment and Plan / ED Course  I have reviewed the triage vital signs and the nursing notes.  Pertinent labs & imaging results that were available during my care of the patient were reviewed by me and considered in my medical decision making (see chart for details).    56 year old male presenting after falling in the bushes and hitting his eye with a branch with associated pain and photophobia. He denies any change in vision otherwise are any other complaints. Exam otherwise reassuring.  Patient doesn't wear contacts Fluorescein dye/woods lamp exam revealed 2 small corneal abrasions.  Discharge home with close follow-up with ophthalmology in 24 hours and PCP and antibiotic ointment.  Discussed strict return precautions. Patient was advised to return to the emergency department if experiencing any new or worsening of symptoms. Patient understood instructions and agreed  with discharge plan.  Final Clinical Impressions(s) / ED Diagnoses   Final diagnoses:  Abrasion of right cornea, initial encounter    New Prescriptions Discharge Medication List as of 02/15/2016  2:23 AM    START taking these medications   Details  erythromycin ophthalmic ointment Place a 1/2 inch ribbon of ointment into the lower eyelid., Print         Georgiana Shore, PA-C 02/15/16 0981    Doug Sou, MD 02/17/16 [phone]

## 2016-02-15 NOTE — Discharge Instructions (Signed)
As discussed, please make sure you wear sunglasses when out in the sun and follow up with primary care and eye doctor.

## 2016-02-17 ENCOUNTER — Encounter (HOSPITAL_COMMUNITY): Payer: Self-pay

## 2016-02-17 ENCOUNTER — Emergency Department (HOSPITAL_COMMUNITY)
Admission: EM | Admit: 2016-02-17 | Discharge: 2016-02-18 | Disposition: A | Discharge status: Home / Self Care | Payer: Self-pay | Attending: Emergency Medicine | Admitting: Emergency Medicine

## 2016-02-17 DIAGNOSIS — I10 Essential (primary) hypertension: Secondary | ICD-10-CM | POA: Insufficient documentation

## 2016-02-17 DIAGNOSIS — F17210 Nicotine dependence, cigarettes, uncomplicated: Secondary | ICD-10-CM | POA: Insufficient documentation

## 2016-02-17 DIAGNOSIS — E162 Hypoglycemia, unspecified: Secondary | ICD-10-CM

## 2016-02-17 DIAGNOSIS — Z794 Long term (current) use of insulin: Secondary | ICD-10-CM | POA: Insufficient documentation

## 2016-02-17 DIAGNOSIS — Z79899 Other long term (current) drug therapy: Secondary | ICD-10-CM | POA: Insufficient documentation

## 2016-02-17 DIAGNOSIS — E11649 Type 2 diabetes mellitus with hypoglycemia without coma: Secondary | ICD-10-CM | POA: Insufficient documentation

## 2016-02-17 LAB — CBG MONITORING, ED
GLUCOSE-CAPILLARY: 124 mg/dL — AB (ref 65–99)
GLUCOSE-CAPILLARY: 54 mg/dL — AB (ref 65–99)

## 2016-02-17 NOTE — ED Notes (Signed)
Pt reports hypoglycemia since 1800 tonight. Pt denies N/V/D, dizziness, weakness, or pain. Pt has no other complaints at this time.

## 2016-02-17 NOTE — ED Triage Notes (Signed)
Pt here for low blood sugar. Pt states that his blood sugar has been low today in the 30s, he checked it twice and ate sweet afterward. CBG 54 in triage, pt given peanut butter and graham crackers and orange juice. Pt alert and oriented x4. Denies dizziness. VSS

## 2016-02-18 LAB — CBC WITH DIFFERENTIAL/PLATELET
Basophils Absolute: 0 10*3/uL (ref 0.0–0.1)
Basophils Relative: 1 %
EOS ABS: 0.3 10*3/uL (ref 0.0–0.7)
Eosinophils Relative: 5 %
HEMATOCRIT: 32.5 % — AB (ref 39.0–52.0)
HEMOGLOBIN: 10.3 g/dL — AB (ref 13.0–17.0)
LYMPHS ABS: 3 10*3/uL (ref 0.7–4.0)
LYMPHS PCT: 46 %
MCH: 24.8 pg — AB (ref 26.0–34.0)
MCHC: 31.7 g/dL (ref 30.0–36.0)
MCV: 78.1 fL (ref 78.0–100.0)
MONOS PCT: 7 %
Monocytes Absolute: 0.4 10*3/uL (ref 0.1–1.0)
NEUTROS ABS: 2.7 10*3/uL (ref 1.7–7.7)
NEUTROS PCT: 41 %
Platelets: 273 10*3/uL (ref 150–400)
RBC: 4.16 MIL/uL — AB (ref 4.22–5.81)
RDW: 12.8 % (ref 11.5–15.5)
WBC: 6.5 10*3/uL (ref 4.0–10.5)

## 2016-02-18 LAB — URINALYSIS, ROUTINE W REFLEX MICROSCOPIC
BILIRUBIN URINE: NEGATIVE
GLUCOSE, UA: NEGATIVE mg/dL
Ketones, ur: 5 mg/dL — AB
Leukocytes, UA: NEGATIVE
Nitrite: NEGATIVE
PROTEIN: 100 mg/dL — AB
Specific Gravity, Urine: 1.029 (ref 1.005–1.030)
WBC UA: NONE SEEN WBC/hpf (ref 0–5)
pH: 5 (ref 5.0–8.0)

## 2016-02-18 LAB — COMPREHENSIVE METABOLIC PANEL
ALT: 15 U/L — AB (ref 17–63)
ANION GAP: 8 (ref 5–15)
AST: 22 U/L (ref 15–41)
Albumin: 3.7 g/dL (ref 3.5–5.0)
Alkaline Phosphatase: 74 U/L (ref 38–126)
BUN: 11 mg/dL (ref 6–20)
CHLORIDE: 105 mmol/L (ref 101–111)
CO2: 26 mmol/L (ref 22–32)
CREATININE: 0.87 mg/dL (ref 0.61–1.24)
Calcium: 9.4 mg/dL (ref 8.9–10.3)
Glucose, Bld: 117 mg/dL — ABNORMAL HIGH (ref 65–99)
POTASSIUM: 4 mmol/L (ref 3.5–5.1)
SODIUM: 139 mmol/L (ref 135–145)
Total Bilirubin: 0.8 mg/dL (ref 0.3–1.2)
Total Protein: 6.9 g/dL (ref 6.5–8.1)

## 2016-02-18 LAB — CBG MONITORING, ED
GLUCOSE-CAPILLARY: 130 mg/dL — AB (ref 65–99)
GLUCOSE-CAPILLARY: 132 mg/dL — AB (ref 65–99)
GLUCOSE-CAPILLARY: 149 mg/dL — AB (ref 65–99)
Glucose-Capillary: 120 mg/dL — ABNORMAL HIGH (ref 65–99)
Glucose-Capillary: 123 mg/dL — ABNORMAL HIGH (ref 65–99)
Glucose-Capillary: 123 mg/dL — ABNORMAL HIGH (ref 65–99)
Glucose-Capillary: 146 mg/dL — ABNORMAL HIGH (ref 65–99)
Glucose-Capillary: 163 mg/dL — ABNORMAL HIGH (ref 65–99)
Glucose-Capillary: 173 mg/dL — ABNORMAL HIGH (ref 65–99)

## 2016-02-18 NOTE — ED Notes (Signed)
ED Provider at bedside. 

## 2016-02-18 NOTE — Discharge Instructions (Signed)
You have been seen today for low blood sugar. This was monitored here in the ED with improvement noted. Please be sure to eat regular, high quality meals to maintain healthy blood sugar. Monitor your blood sugar closely this week for changes. Follow up with your primary care provider as soon as possible for evaluation of this issue. Return to the ED should symptoms recur or other concerning symptoms arise.

## 2016-02-18 NOTE — ED Notes (Signed)
Pt ambulated down hall without additional assistance needed. Pt gait steady, no compliants of nausea of dizziness.

## 2016-02-18 NOTE — ED Provider Notes (Signed)
[facility] DEPT Provider Note   CSN: [id number] Arrival date & time: 02/17/16  2311     History   Chief Complaint Chief Complaint  Patient presents with  . Hypoglycemia    HPI Mike [name] is a 56 y.o. male.  HPI    Mike [name] is a 56 y.o. male, with a history of DM and HTN, presenting to the ED with hypoglycemia beginning this evening.  Took 20 units of 70/30 insulin at around 2 pm today. His BG prior to this was 250. States after he took his insulin he ate a chicken, some corn, and beans, which is less than he usually eats. He tells me he did not get a low BG at home, he just felt like his sugar was low so he came to the ED. Pt states his BG usually runs between 200-350. His only complaint is "I just know I didn't feel right." Patient denies current complaints.  Denies fever/chills, N/V/D, dizziness, LOC, recent illness, or any other complaints.     Past Medical History:  Diagnosis Date  . Anxiety   . Anxiety   . Chronic lower back pain   . Depression   . Hyperlipemia   . Hypertension   . Migraine    "last one was ~ 4 yr ago" (12/23/2014)  . Seizures (HCC)    "related to pills for anxiety; if I don't take the pills I'm suppose to take I'll have them" (12/23/2014)  . Type II diabetes mellitus [facility])     Patient Active Problem List   Diagnosis Date Noted  . Protein-calorie malnutrition, severe 12/24/2014  . Chest pain, pleuritic 12/23/2014  . Homelessness 12/23/2014  . Hypertensive urgency 12/23/2014  . DM (diabetes mellitus) (HCC) 12/23/2014  . Intermittent palpitations 12/23/2014  . Tobacco abuse 12/23/2014  . Pleuritic chest pain 12/23/2014  . Abnormal EKG 12/23/2014  . Cough   . Diabetes mellitus with complication [facility]     Past Surgical History:  Procedure Laterality Date  . NO PAST SURGERIES         Home Medications    Prior to Admission medications   Medication Sig Start Date End Date Taking? Authorizing Provider  amLODipine  (NORVASC) 10 MG tablet Take 1 tablet (10 mg total) by mouth daily. 02/18/15  Yes Tomasita Crumble, MD  atorvastatin (LIPITOR) 20 MG tablet Take 20 mg by mouth daily.   Yes Historical Provider, MD  chlorpheniramine-HYDROcodone (TUSSIONEX) 10-8 MG/5ML SUER Take 5 mLs by mouth at bedtime as needed for cough. 12/26/14  Yes Kathlen Mody, MD  clonazePAM (KLONOPIN) 0.5 MG tablet Take 1 tablet (0.5 mg total) by mouth daily. 12/26/14  Yes Kathlen Mody, MD  insulin NPH-regular Human (NOVOLIN 70/30) (70-30) 100 UNIT/ML injection Inject 20 Units into the skin daily with breakfast.   Yes Historical Provider, MD  lisinopril (PRINIVIL,ZESTRIL) 20 MG tablet Take 1 tablet (20 mg total) by mouth daily. 02/18/15  Yes Tomasita Crumble, MD    Family History History reviewed. No pertinent family history.  Social History Social History  Substance Use Topics  . Smoking status: Current Every Day Smoker    Packs/day: 0.10    Years: 32.00    Types: Cigarettes  . Smokeless tobacco: Never Used  . Alcohol use 1.2 oz/week    2 Cans of beer per week     Allergies   Ibuprofen; Ibuprofen; Tylenol [acetaminophen]; and Tylenol [acetaminophen]   Review of Systems Review of Systems  Constitutional: Negative for chills and fever.  Respiratory: Negative  for cough and shortness of breath.   Gastrointestinal: Negative for abdominal pain, diarrhea, nausea and vomiting.  Endocrine:       Hypoglycemia  Genitourinary: Negative for decreased urine volume and dysuria.  Neurological: Negative for dizziness, syncope, weakness, light-headedness, numbness and headaches.  All other systems reviewed and are negative.    Physical Exam Updated Vital Signs BP 171/85   Pulse 65   Temp 97.7 F (36.5 C) (Oral)   Resp 21   Ht 6\' 4"  (1.93 m)   Wt 77.8 kg   SpO2 99%   BMI 20.88 kg/m   Physical Exam  Constitutional: He is oriented to person, place, and time. He appears well-developed and well-nourished. No distress.  HENT:  Head:  Normocephalic and atraumatic.  Mouth/Throat: Oropharynx is clear and moist.  Eyes: Conjunctivae and EOM are normal. Pupils are equal, round, and reactive to light.  Neck: Neck supple.  Cardiovascular: Normal rate, regular rhythm, normal heart sounds and intact distal pulses.   Pulmonary/Chest: Effort normal and breath sounds normal. No respiratory distress.  Abdominal: Soft. There is no tenderness. There is no guarding.  Musculoskeletal: He exhibits no edema.  Lymphadenopathy:    He has no cervical adenopathy.  Neurological: He is alert and oriented to person, place, and time.  No sensory deficits. Strength 5/5 in all extremities. No gait disturbance. Coordination intact including heel to shin and finger to nose. Cranial nerves III-XII grossly intact. No facial droop.   Skin: Skin is warm and dry. He is not diaphoretic.  Psychiatric: He has a normal mood and affect. His behavior is normal.  Nursing note and vitals reviewed.    ED Treatments / Results  Labs (all labs ordered are listed, but only abnormal results are displayed) Labs Reviewed  COMPREHENSIVE METABOLIC PANEL - Abnormal; Notable for the following:       Result Value   Glucose, Bld 117 (*)    ALT 15 (*)    All other components within normal limits  CBC WITH DIFFERENTIAL/PLATELET - Abnormal; Notable for the following:    RBC 4.16 (*)    Hemoglobin 10.3 (*)    HCT 32.5 (*)    MCH 24.8 (*)    All other components within normal limits  URINALYSIS, ROUTINE W REFLEX MICROSCOPIC - Abnormal; Notable for the following:    Color, Urine AMBER (*)    APPearance HAZY (*)    Hgb urine dipstick SMALL (*)    Ketones, ur 5 (*)    Protein, ur 100 (*)    Bacteria, UA MANY (*)    Squamous Epithelial / LPF 0-5 (*)    All other components within normal limits  CBG MONITORING, ED - Abnormal; Notable for the following:    Glucose-Capillary 54 (*)    All other components within normal limits  CBG MONITORING, ED - Abnormal; Notable for  the following:    Glucose-Capillary 124 (*)    All other components within normal limits  CBG MONITORING, ED - Abnormal; Notable for the following:    Glucose-Capillary 146 (*)    All other components within normal limits  CBG MONITORING, ED - Abnormal; Notable for the following:    Glucose-Capillary 173 (*)    All other components within normal limits  CBG MONITORING, ED - Abnormal; Notable for the following:    Glucose-Capillary 163 (*)    All other components within normal limits  CBG MONITORING, ED - Abnormal; Notable for the following:    Glucose-Capillary 149 (*)  All other components within normal limits  CBG MONITORING, ED - Abnormal; Notable for the following:    Glucose-Capillary 132 (*)    All other components within normal limits  CBG MONITORING, ED - Abnormal; Notable for the following:    Glucose-Capillary 123 (*)    All other components within normal limits  CBG MONITORING, ED - Abnormal; Notable for the following:    Glucose-Capillary 123 (*)    All other components within normal limits  CBG MONITORING, ED - Abnormal; Notable for the following:    Glucose-Capillary 120 (*)    All other components within normal limits  CBG MONITORING, ED - Abnormal; Notable for the following:    Glucose-Capillary 130 (*)    All other components within normal limits  URINE CULTURE   Hemoglobin  Date Value Ref Range Status  02/18/2016 10.3 (L) 13.0 - 17.0 g/dL Final  58/09/9831 82.5 (L) 13.0 - 17.0 g/dL Final  05/39/7673 9.9 (L) 13.0 - 17.0 g/dL Final  41/93/7902 40.9 (L) 13.0 - 17.0 g/dL Final    EKG  EKG Interpretation None       Radiology No results found.  Procedures Procedures (including critical care time)  Medications Ordered in ED Medications - No data to display   Initial Impression / Assessment and Plan / ED Course  I have reviewed the triage vital signs and the nursing notes.  Pertinent labs & imaging results that were available during my care of  the patient were reviewed by me and considered in my medical decision making (see chart for details).     Patient presents with borderline hypoglycemia. He is nontoxic appearing. Patient reevaluated multiple times during his stay. He was given something to eat. Blood sugar stabilized over multiple samples. Pt remained complaint free. Patient ambulated without difficulty or assistance. Recommended to patient that he be sure to eat regular, healthy meals. PCP follow-up. Return precautions discussed. Patient voices understanding of all instructions and is comfortable with discharge.    Findings and plan of care discussed with Glynn Octave, MD.   Vitals:   02/17/16 2356 02/18/16 0000 02/18/16 0015 02/18/16 0030  BP:  161/79 160/96 169/87  Pulse: 65 61 66 72  Resp: 21 20 12 12   Temp:      TempSrc:      SpO2: 99% 100% 100% 99%  Weight:      Height:       Vitals:   02/18/16 0115 02/18/16 0145 02/18/16 0200 02/18/16 0232  BP: 168/87 172/88 170/85   Pulse: 69 66 68   Resp: 17     Temp:    98.3 F (36.8 C)  TempSrc:    Oral  SpO2: 100% 100% 97%   Weight:      Height:         Final Clinical Impressions(s) / ED Diagnoses   Final diagnoses:  Hypoglycemia    New Prescriptions Discharge Medication List as of 02/18/2016  2:00 AM       Anselm Pancoast, PA-C 02/18/16 0727    Glynn Octave, MD 02/18/16 [phone]

## 2016-02-18 NOTE — ED Notes (Signed)
Pt eating PB and graham crackers.

## 2016-02-18 NOTE — ED Notes (Addendum)
Pt given OJ, PB and graham crackers, and a [place] sandwich.

## 2016-02-19 ENCOUNTER — Emergency Department (HOSPITAL_COMMUNITY)
Admission: EM | Admit: 2016-02-19 | Discharge: 2016-02-20 | Disposition: A | Discharge status: Home / Self Care | Payer: Self-pay | Attending: Emergency Medicine | Admitting: Emergency Medicine

## 2016-02-19 ENCOUNTER — Encounter (HOSPITAL_COMMUNITY): Payer: Self-pay | Admitting: Emergency Medicine

## 2016-02-19 DIAGNOSIS — W01198A Fall on same level from slipping, tripping and stumbling with subsequent striking against other object, initial encounter: Secondary | ICD-10-CM | POA: Insufficient documentation

## 2016-02-19 DIAGNOSIS — Z79899 Other long term (current) drug therapy: Secondary | ICD-10-CM | POA: Insufficient documentation

## 2016-02-19 DIAGNOSIS — I10 Essential (primary) hypertension: Secondary | ICD-10-CM | POA: Insufficient documentation

## 2016-02-19 DIAGNOSIS — Y929 Unspecified place or not applicable: Secondary | ICD-10-CM | POA: Insufficient documentation

## 2016-02-19 DIAGNOSIS — Z794 Long term (current) use of insulin: Secondary | ICD-10-CM | POA: Insufficient documentation

## 2016-02-19 DIAGNOSIS — Y939 Activity, unspecified: Secondary | ICD-10-CM | POA: Insufficient documentation

## 2016-02-19 DIAGNOSIS — Y999 Unspecified external cause status: Secondary | ICD-10-CM | POA: Insufficient documentation

## 2016-02-19 DIAGNOSIS — S0501XA Injury of conjunctiva and corneal abrasion without foreign body, right eye, initial encounter: Secondary | ICD-10-CM | POA: Insufficient documentation

## 2016-02-19 DIAGNOSIS — F17210 Nicotine dependence, cigarettes, uncomplicated: Secondary | ICD-10-CM | POA: Insufficient documentation

## 2016-02-19 DIAGNOSIS — E119 Type 2 diabetes mellitus without complications: Secondary | ICD-10-CM | POA: Insufficient documentation

## 2016-02-19 LAB — URINE CULTURE: CULTURE: NO GROWTH

## 2016-02-19 MED ORDER — AMLODIPINE BESYLATE 5 MG PO TABS
10.0000 mg | ORAL_TABLET | Freq: Once | ORAL | Status: AC
  Administered 2016-02-19: 10 mg via ORAL
  Filled 2016-02-19: qty 2

## 2016-02-19 MED ORDER — LISINOPRIL 20 MG PO TABS
20.0000 mg | ORAL_TABLET | Freq: Once | ORAL | Status: AC
  Administered 2016-02-19: 20 mg via ORAL
  Filled 2016-02-19: qty 1

## 2016-02-19 NOTE — ED Provider Notes (Signed)
[facility]-EMERGENCY DEPT Provider Note   CSN: [id number] Arrival date & time: 02/19/16  2218     History   Chief Complaint Chief Complaint  Patient presents with  . Eye Pain    HPI Mike [name] is a 56 y.o. male with history of hypertension, type 2 diabetes who presents with a four-day history of right eye pain after slipping and catching his eye on a branch. He was seen on 02/15/2016 and prescribed e-mycin ointment and has been using as prescribed. Patient states he has had continued irritation, sensitivity to light, and some mild blurred vision since the incident. He does not wear glasses or contacts. Patient has not followed up with ophthalmologist. He denies any other symptoms. Patient does state he ran out of his blood pressure medications yesterday and has not taken them today. Patient denies any chest pain, shortness of breath, abdominal pain, nausea, vomiting, urinary symptoms.  HPI  Past Medical History:  Diagnosis Date  . Anxiety   . Anxiety   . Chronic lower back pain   . Depression   . Hyperlipemia   . Hypertension   . Migraine    "last one was ~ 4 yr ago" (12/23/2014)  . Seizures (HCC)    "related to pills for anxiety; if I don't take the pills I'm suppose to take I'll have them" (12/23/2014)  . Type II diabetes mellitus [facility](HCC)     Patient Active Problem List   Diagnosis Date Noted  . Protein-calorie malnutrition, severe 12/24/2014  . Chest pain, pleuritic 12/23/2014  . Homelessness 12/23/2014  . Hypertensive urgency 12/23/2014  . DM (diabetes mellitus) (HCC) 12/23/2014  . Intermittent palpitations 12/23/2014  . Tobacco abuse 12/23/2014  . Pleuritic chest pain 12/23/2014  . Abnormal EKG 12/23/2014  . Cough   . Diabetes mellitus with complication [facility](HCC)     Past Surgical History:  Procedure Laterality Date  . NO PAST SURGERIES         Home Medications    Prior to Admission medications   Medication Sig Start Date End Date Taking? Authorizing Provider    atorvastatin (LIPITOR) 20 MG tablet Take 20 mg by mouth daily.   Yes Historical Provider, MD  clonazePAM (KLONOPIN) 1 MG tablet Take 1 mg by mouth daily.   Yes Historical Provider, MD  insulin NPH-regular Human (NOVOLIN 70/30) (70-30) 100 UNIT/ML injection Inject 20 Units into the skin daily with breakfast.   Yes Historical Provider, MD  amLODipine (NORVASC) 10 MG tablet Take 1 tablet (10 mg total) by mouth daily. 02/20/16   Emi HolesAlexandra M Kea Callan, PA-C  chlorpheniramine-HYDROcodone (TUSSIONEX) 10-8 MG/5ML SUER Take 5 mLs by mouth at bedtime as needed for cough. Patient not taking: Reported on 02/19/2016 12/26/14   Kathlen ModyVijaya Akula, MD  clonazePAM (KLONOPIN) 0.5 MG tablet Take 1 tablet (0.5 mg total) by mouth daily. Patient not taking: Reported on 02/19/2016 12/26/14   Kathlen ModyVijaya Akula, MD  lisinopril (PRINIVIL,ZESTRIL) 20 MG tablet Take 1 tablet (20 mg total) by mouth daily. 02/20/16   Emi HolesAlexandra M Kemp Gomes, PA-C    Family History No family history on file.  Social History Social History  Substance Use Topics  . Smoking status: Current Every Day Smoker    Packs/day: 0.10    Years: 32.00    Types: Cigarettes  . Smokeless tobacco: Never Used  . Alcohol use 1.2 oz/week    2 Cans of beer per week     Allergies   Ibuprofen; Ibuprofen; Tylenol [acetaminophen]; and Tylenol [acetaminophen]   Review of Systems  Review of Systems  Constitutional: Negative for chills and fever.  HENT: Negative for facial swelling and sore throat.   Eyes: Positive for photophobia, redness and visual disturbance. Negative for discharge.  Respiratory: Negative for shortness of breath.   Cardiovascular: Negative for chest pain.  Gastrointestinal: Negative for abdominal pain, nausea and vomiting.  Genitourinary: Negative for dysuria.  Musculoskeletal: Negative for back pain.  Skin: Negative for rash and wound.  Neurological: Negative for headaches.  Psychiatric/Behavioral: The patient is not nervous/anxious.      Physical  Exam Updated Vital Signs BP (!) 204/97 (BP Location: Left Arm)   Pulse 61   Temp 98 F (36.7 C) (Oral)   Resp 18   Ht 6\' 4"  (1.93 m)   Wt 72.6 kg   SpO2 98%   BMI 19.48 kg/m   Physical Exam  Constitutional: He appears well-developed and well-nourished. No distress.  HENT:  Head: Normocephalic and atraumatic.  Mouth/Throat: Oropharynx is clear and moist. No oropharyngeal exudate.  Eyes: EOM are normal. Pupils are equal, round, and reactive to light. Right eye exhibits no discharge and no exudate. Left eye exhibits no discharge. Right conjunctiva is injected (mild). Right conjunctiva has no hemorrhage. No scleral icterus.  Visual acuity: Bilateral 20/50, R 20/100, L 20/70 Fluorescein staining shows uptake in conjunctiva at 6:00 Tono-Pen pressures average 19 in the right eye  Neck: Normal range of motion. Neck supple. No thyromegaly present.  Cardiovascular: Normal rate, regular rhythm, normal heart sounds and intact distal pulses.  Exam reveals no gallop and no friction rub.   No murmur heard. Pulmonary/Chest: Effort normal and breath sounds normal. No stridor. No respiratory distress. He has no wheezes. He has no rales.  Abdominal: Soft. Bowel sounds are normal. He exhibits no distension. There is no tenderness. There is no rebound and no guarding.  Musculoskeletal: He exhibits no edema.  Lymphadenopathy:    He has no cervical adenopathy.  Neurological: He is alert. Coordination normal.  Skin: Skin is warm and dry. No rash noted. He is not diaphoretic. No pallor.  Psychiatric: He has a normal mood and affect.  Nursing note and vitals reviewed.    ED Treatments / Results  Labs (all labs ordered are listed, but only abnormal results are displayed) Labs Reviewed - No data to display  EKG  EKG Interpretation None       Radiology No results found.  Procedures Procedures (including critical care time)  Medications Ordered in ED Medications  amLODipine (NORVASC)  tablet 10 mg (10 mg Oral Given 02/19/16 2326)  lisinopril (PRINIVIL,ZESTRIL) tablet 20 mg (20 mg Oral Given 02/19/16 2326)     Initial Impression / Assessment and Plan / ED Course  I have reviewed the triage vital signs and the nursing notes.  Pertinent labs & imaging results that were available during my care of the patient were reviewed by me and considered in my medical decision making (see chart for details).     Pt with corneal abrasion on exam. No evidence of FB.  Exam not concerning for orbital cellulitis, hyphema. No concern for uveitis. Patient to continue using E-Mycin ointment.  Patient understands to follow up with ophthalmology as soon as possible; return precautions discussed. Patient given blood pressure medications in the ED and discharged home with refills. Blood pressure reduced to some degree in the ED. Patient states his blood pressure is usually elevated at baseline. Patient advised to follow up with PCP for recheck. Patient appears safe for discharged. Patient also evaluated  by Dr. Ranae Palms who guided the patient's management and agrees with plan.   Final Clinical Impressions(s) / ED Diagnoses   Final diagnoses:  Abrasion of right cornea, initial encounter  Hypertension, unspecified type    New Prescriptions Current Discharge Medication List          Emi Holes, PA-C 02/20/16 0100    Loren Racer, MD 02/23/16 [phone]

## 2016-02-19 NOTE — ED Triage Notes (Addendum)
Pt c/o right eye pain. Pt states he fell 4 days ago and hit eye on a branch. Pt reports difficulty seeing out of right eye and pain when closing eye.   Pt adds running out of BP medication yesterday.

## 2016-02-20 ENCOUNTER — Encounter (HOSPITAL_COMMUNITY): Payer: Self-pay | Admitting: Emergency Medicine

## 2016-02-20 ENCOUNTER — Emergency Department (HOSPITAL_COMMUNITY)
Admission: EM | Admit: 2016-02-20 | Discharge: 2016-02-20 | Disposition: A | Discharge status: Home / Self Care | Payer: Self-pay | Attending: Emergency Medicine | Admitting: Emergency Medicine

## 2016-02-20 DIAGNOSIS — F17210 Nicotine dependence, cigarettes, uncomplicated: Secondary | ICD-10-CM | POA: Insufficient documentation

## 2016-02-20 DIAGNOSIS — I10 Essential (primary) hypertension: Secondary | ICD-10-CM | POA: Insufficient documentation

## 2016-02-20 DIAGNOSIS — E119 Type 2 diabetes mellitus without complications: Secondary | ICD-10-CM | POA: Insufficient documentation

## 2016-02-20 DIAGNOSIS — G44209 Tension-type headache, unspecified, not intractable: Secondary | ICD-10-CM

## 2016-02-20 DIAGNOSIS — R51 Headache: Secondary | ICD-10-CM | POA: Insufficient documentation

## 2016-02-20 MED ORDER — LISINOPRIL 20 MG PO TABS
20.0000 mg | ORAL_TABLET | Freq: Once | ORAL | Status: AC
  Administered 2016-02-20: 20 mg via ORAL
  Filled 2016-02-20: qty 1

## 2016-02-20 MED ORDER — LISINOPRIL 20 MG PO TABS: 20.0000 mg | ORAL_TABLET | Freq: Every day | ORAL | 0 refills | Status: DC

## 2016-02-20 MED ORDER — AMLODIPINE BESYLATE 10 MG PO TABS
10.0000 mg | ORAL_TABLET | Freq: Every day | ORAL | 0 refills | Status: DC
Start: 2016-02-20 — End: 2016-06-28

## 2016-02-20 MED ORDER — NAPROXEN 500 MG PO TABS
500.0000 mg | ORAL_TABLET | Freq: Once | ORAL | Status: AC
  Administered 2016-02-20: 500 mg via ORAL
  Filled 2016-02-20: qty 1
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
## 2016-02-20 NOTE — Discharge Instructions (Signed)
Continue using your antibiotic ointment. Please follow up with the ophthalmologist as soon as possible for follow up and further evaluation and treatment of your eye injury. It is important that you do this. Please return to the emergency department if you develop any new or worsening symptoms. Please see your primary care provider as soon as possible for recheck of blood pressure.

## 2016-02-20 NOTE — Discharge Instructions (Signed)

## 2016-04-17 ENCOUNTER — Emergency Department (HOSPITAL_COMMUNITY)
Admission: EM | Admit: 2016-04-17 | Discharge: 2016-04-17 | Disposition: A | Discharge status: Home / Self Care | Payer: Self-pay | Attending: Emergency Medicine | Admitting: Emergency Medicine

## 2016-04-17 ENCOUNTER — Encounter (HOSPITAL_COMMUNITY): Payer: Self-pay | Admitting: Emergency Medicine

## 2016-04-17 DIAGNOSIS — E1165 Type 2 diabetes mellitus with hyperglycemia: Secondary | ICD-10-CM | POA: Insufficient documentation

## 2016-04-17 DIAGNOSIS — F17210 Nicotine dependence, cigarettes, uncomplicated: Secondary | ICD-10-CM | POA: Insufficient documentation

## 2016-04-17 DIAGNOSIS — F418 Other specified anxiety disorders: Secondary | ICD-10-CM | POA: Insufficient documentation

## 2016-04-17 DIAGNOSIS — R739 Hyperglycemia, unspecified: Secondary | ICD-10-CM

## 2016-04-17 DIAGNOSIS — I10 Essential (primary) hypertension: Secondary | ICD-10-CM | POA: Insufficient documentation

## 2016-04-17 LAB — CBC
HCT: 34.1 % — ABNORMAL LOW (ref 39.0–52.0)
Hemoglobin: 11 g/dL — ABNORMAL LOW (ref 13.0–17.0)
MCH: 24.8 pg — AB (ref 26.0–34.0)
MCHC: 32.3 g/dL (ref 30.0–36.0)
MCV: 77 fL — ABNORMAL LOW (ref 78.0–100.0)
PLATELETS: 282 10*3/uL (ref 150–400)
RBC: 4.43 MIL/uL (ref 4.22–5.81)
RDW: 13 % (ref 11.5–15.5)
WBC: 7.2 10*3/uL (ref 4.0–10.5)

## 2016-04-17 LAB — BASIC METABOLIC PANEL
Anion gap: 11 (ref 5–15)
BUN: 13 mg/dL (ref 6–20)
CALCIUM: 9.4 mg/dL (ref 8.9–10.3)
CO2: 28 mmol/L (ref 22–32)
Chloride: 95 mmol/L — ABNORMAL LOW (ref 101–111)
Creatinine, Ser: 0.98 mg/dL (ref 0.61–1.24)
Glucose, Bld: 261 mg/dL — ABNORMAL HIGH (ref 65–99)
Potassium: 3.9 mmol/L (ref 3.5–5.1)
Sodium: 134 mmol/L — ABNORMAL LOW (ref 135–145)

## 2016-04-17 LAB — CBG MONITORING, ED: GLUCOSE-CAPILLARY: 237 mg/dL — AB (ref 65–99)

## 2016-04-17 MED ORDER — HYDROXYZINE HCL 25 MG PO TABS: 25.0000 mg | ORAL_TABLET | Freq: Four times a day (QID) | ORAL | 0 refills | Status: DC | PRN

## 2016-04-17 NOTE — Discharge Instructions (Signed)

## 2016-04-17 NOTE — ED Notes (Signed)
Pt verbalized understanding of d/c instructions, pt given information on discount prescriptions programs.

## 2016-04-17 NOTE — ED Provider Notes (Signed)
Emergency Department Provider Note   I have reviewed the triage vital signs and the nursing notes.   HISTORY  Chief Complaint Hyperglycemia   HPI Mike [name] is a 56 y.o. male with history of IDDM and hypertension who presents to the ED for evaluation of anxiety. This patient states that 6 days ago he was tapered off Klonopin. Since that time he has "not been getting any sleep." He presents to this facility because "he can tell his sugar is high." He denies any medications changes. He is taking his insulin. No other drug use. No fevers or chills. No radiation of symptoms. Symptoms are moderate in severity and persistent. No modifying factors.   Past Medical History:  Diagnosis Date  . Anxiety   . Anxiety   . Chronic lower back pain   . Depression   . Hyperlipemia   . Hypertension   . Migraine    "last one was ~ 4 yr ago" (12/23/2014)  . Seizures (HCC)    "related to pills for anxiety; if I don't take the pills I'm suppose to take I'll have them" (12/23/2014)  . Type II diabetes mellitus [facility])     Patient Active Problem List   Diagnosis Date Noted  . Protein-calorie malnutrition, severe 12/24/2014  . Chest pain, pleuritic 12/23/2014  . Homelessness 12/23/2014  . Hypertensive urgency 12/23/2014  . DM (diabetes mellitus) (HCC) 12/23/2014  . Intermittent palpitations 12/23/2014  . Tobacco abuse 12/23/2014  . Pleuritic chest pain 12/23/2014  . Abnormal EKG 12/23/2014  . Cough   . Diabetes mellitus with complication [facility])     Past Surgical History:  Procedure Laterality Date  . NO PAST SURGERIES      Current Outpatient Rx  . Order #: [id number] Class: Print  . Order #: [id number] Class: Historical Med  . Order #: [id number] Class: Historical Med  . Order #: [id number] Class: Print  . Order #: [id number] Class: Historical Med  . Order #: [id number] Class: Print    Allergies Ibuprofen and Tylenol [acetaminophen]  No family history on file.  Social History Social  History  Substance Use Topics  . Smoking status: Current Every Day Smoker    Packs/day: 0.10    Years: 32.00    Types: Cigarettes  . Smokeless tobacco: Never Used  . Alcohol use 1.2 oz/week    2 Cans of beer per week    Review of Systems Constitutional: No fever/chills Eyes: No visual changes. ENT: No sore throat. Cardiovascular: Denies chest pain. Respiratory: Denies shortness of breath. Gastrointestinal: No abdominal pain.  No nausea, no vomiting.  No diarrhea.  No constipation. Genitourinary: Negative for dysuria. Musculoskeletal: Negative for back pain. Skin: Negative for rash. Neurological: Negative for headaches, focal weakness or numbness. Psychiatric:+ anxiety complaints  10-point ROS otherwise negative.  ____________________________________________   PHYSICAL EXAM:  VITAL SIGNS: ED Triage Vitals  Enc Vitals Group     BP 04/17/16 0109 (!) 195/84     Pulse Rate 04/17/16 0109 71     Resp 04/17/16 0109 18     Temp 04/17/16 0109 98.5 F (36.9 C)     Temp Source 04/17/16 0109 Oral     SpO2 04/17/16 0109 100 %     Weight 04/17/16 0110 165 lb (74.8 kg)     Height 04/17/16 0110  (1.93 m)   Constitutional: Alert and oriented. Well appearing and in no acute distress. Eyes: Conjunctivae are normal. Head: Atraumatic. Nose: No congestion/rhinnorhea. Mouth/Throat: Mucous membranes are moist.   Neck: No stridor.  Cardiovascular: Normal rate, regular rhythm. Good peripheral circulation. Grossly normal heart sounds.   Respiratory: Normal respiratory effort.  No retractions. Lungs CTAB. Gastrointestinal: Soft and nontender. No distention.  Musculoskeletal: No lower extremity tenderness nor edema. No gross deformities of extremities. Neurologic:  Normal speech and language. No gross focal neurologic deficits are appreciated.  Skin:  Skin is warm, dry and intact. No rash noted. Psychiatric: Mood and affect are normal. Speech and behavior are  normal.  ____________________________________________   LABS (all labs ordered are listed, but only abnormal results are displayed)  Labs Reviewed  BASIC METABOLIC PANEL - Abnormal; Notable for the following:       Result Value   Sodium 134 (*)    Chloride 95 (*)    Glucose, Bld 261 (*)    All other components within normal limits  CBC - Abnormal; Notable for the following:    Hemoglobin 11.0 (*)    HCT 34.1 (*)    MCV 77.0 (*)    MCH 24.8 (*)    All other components within normal limits  CBG MONITORING, ED - Abnormal; Notable for the following:    Glucose-Capillary 237 (*)    All other components within normal limits   ____________________________________________   PROCEDURES  Procedure(s) performed:   Procedures  None ____________________________________________   INITIAL IMPRESSION / ASSESSMENT AND PLAN / ED COURSE  Pertinent labs & imaging results that were available during my care of the patient were reviewed by me and considered in my medical decision making (see chart for details).  Patient presents to the emergency room in for evaluation of increasing anxiety and difficulty sleeping after stopping his Klonopin. He states that [facility] discontinued this medication and did not replace it with a different anxiety medication. He has been off of this drug for the past 6 days. Do not believe the patient is at risk for life-threatening withdrawal symptoms at this point. Plan to discharge home with Atarax. He will continue his insulin regimen. Discussed his hyperglycemia but no evidence of DKA. Patient will follow with his primary care physician, Dr. Hilbert Corrigan, as directed.   At this time, I do not feel there is any life-threatening condition present. I have reviewed and discussed all results (EKG, imaging, lab, urine as appropriate), exam findings with patient. I have reviewed nursing notes and appropriate previous records.  I feel the patient is safe to be discharged home  without further emergent workup. Discussed usual and customary return precautions. Patient and family (if present) verbalize understanding and are comfortable with this plan.  Patient will follow-up with their primary care provider. If they do not have a primary care provider, information for follow-up has been provided to them. All questions have been answered.  ____________________________________________  FINAL CLINICAL IMPRESSION(S) / ED DIAGNOSES  Final diagnoses:  Other specified anxiety disorders  Hyperglycemia     MEDICATIONS GIVEN DURING THIS VISIT:  None  NEW OUTPATIENT MEDICATIONS STARTED DURING THIS VISIT:  New Prescriptions   HYDROXYZINE (ATARAX/VISTARIL) 25 MG TABLET    Take 1 tablet (25 mg total) by mouth every 6 (six) hours as needed for anxiety.    Note:  This document was prepared using Dragon voice recognition software and may include unintentional dictation errors.  Alona Bene, MD Emergency Medicine   Maia Plan, MD 04/17/16 [phone]

## 2016-04-17 NOTE — ED Triage Notes (Signed)
Pt reports feeling weird for a couple of days.  States I think my sugar is high.  CBG-237 in triage.  Reports being thirsty and woke up tonight needed to go to the bathroom and states I was confused so I needed to come to the hospital.  States I have anxiety so it might be why I feel strange.

## 2016-04-18 ENCOUNTER — Encounter: Payer: Self-pay | Admitting: Pediatric Intensive Care

## 2016-05-08 NOTE — Congregational Nurse Program (Signed)
Congregational Nurse Program Note  Date of Encounter: 04/18/2016  Past Medical History: Past Medical History:  Diagnosis Date  . Anxiety   . Anxiety   . Chronic lower back pain   . Depression   . Hyperlipemia   . Hypertension   . Migraine    "last one was ~ 4 yr ago" (12/23/2014)  . Seizures (HCC)    "related to pills for anxiety; if I don't take the pills I'm suppose to take I'll have them" (12/23/2014)  . Type II diabetes mellitus (HCC)     Encounter Details:     CNP Questionnaire - 04/18/16 1020      Patient Demographics   Is this a new or existing patient? New   Patient is considered a/an Immigrant   Race Other     Patient Assistance   Location of Patient Assistance [place]   Patient's financial/insurance status Low Income;Self-Pay (Uninsured)   Uninsured Patient ([place] [employer]s) Yes   Interventions Assisted patient in making appt.   Patient referred to apply for the following financial assistance Not Applicable   Transportation assistance Yes   Type of Assistance Bus Pass Given   Assistance securing medications No   Educational health offerings Navigating the healthcare system     Encounter Details   Primary purpose of visit Chronic Illness/Condition Visit;Safety;Post ED/Hospitalization Visit   Was an Emergency Department visit averted? Yes   Does patient have a medical provider? Yes   Patient referred to Clinic;Follow up with established PCP   Was a mental health screening completed? (GAINS tool) No   Does patient have dental issues? No   Does patient have vision issues? No   Does your patient have an abnormal blood pressure today? Yes   Since previous encounter, have you referred patient for abnormal blood pressure that resulted in a new diagnosis or medication change? No   Does your patient have an abnormal blood glucose today? No   Since previous encounter, have you referred patient for abnormal blood glucose that resulted in a new diagnosis or  medication change? No   Was there a life-saving intervention made? No     New client- referred by Sport and exercise psychologist as client was found wandering at night by shelter staff. Client states he just recently stopped anxiolytics and has a history of diabetes. Declines BG check. CN recommends client go to [facility] clinic this morning for BP check and medication. Bus passes given.

## 2016-05-12 ENCOUNTER — Emergency Department (HOSPITAL_COMMUNITY)
Admission: EM | Admit: 2016-05-12 | Discharge: 2016-05-12 | Disposition: A | Discharge status: Home / Self Care | Payer: Self-pay | Attending: Emergency Medicine | Admitting: Emergency Medicine

## 2016-05-12 ENCOUNTER — Encounter (HOSPITAL_COMMUNITY): Payer: Self-pay | Admitting: Emergency Medicine

## 2016-05-12 DIAGNOSIS — I10 Essential (primary) hypertension: Secondary | ICD-10-CM | POA: Insufficient documentation

## 2016-05-12 DIAGNOSIS — Z794 Long term (current) use of insulin: Secondary | ICD-10-CM | POA: Insufficient documentation

## 2016-05-12 DIAGNOSIS — F17210 Nicotine dependence, cigarettes, uncomplicated: Secondary | ICD-10-CM | POA: Insufficient documentation

## 2016-05-12 DIAGNOSIS — E119 Type 2 diabetes mellitus without complications: Secondary | ICD-10-CM | POA: Insufficient documentation

## 2016-05-12 DIAGNOSIS — R197 Diarrhea, unspecified: Secondary | ICD-10-CM | POA: Insufficient documentation

## 2016-05-12 NOTE — ED Notes (Signed)
ED Provider at bedside. 

## 2016-05-12 NOTE — ED Triage Notes (Signed)
Pt states he was released today from a "detox center was getting off of Klonopin". Pt states they suggested he come here due to having to go the to restroom to many times. Pt states he has no pain. Pt reports he has only used the restroom once in the last 24 hours. Pt states he is homeless and when released he just didn't have anywhere else to go.

## 2016-05-12 NOTE — ED Provider Notes (Signed)
[facility] DEPT Provider Note   CSN: [id number] Arrival date & time: 05/12/16  1047     History   Chief Complaint Chief Complaint  Patient presents with  . Diarrhea    HPI Mike [name] is a 56 y.o. male.  HPI   56 year old male presents today for evaluation of diarrhea.  Patient reports he was released today from detox center in [place] for coming off Klonopin.  He notes that over the last 4 days he has had several episodes of diarrhea.  He reports this is in the morning after awakening; he reports this is not every day.  Patient denies any associated abdominal pain, vomiting, fever, or any other complaints.  Patient reports that he is only here because he was sent here.  Patient reports he does have a primary care and will follow up with her today.  Patient denies any recent antibiotic exposure history of infectious diarrhea.  She does note they started him on blood pressure medication and diabetes medicine, he has insulin, no Metformin noted.   Past Medical History:  Diagnosis Date  . Anxiety   . Anxiety   . Chronic lower back pain   . Depression   . Hyperlipemia   . Hypertension   . Migraine    "last one was ~ 4 yr ago" (12/23/2014)  . Seizures (HCC)    "related to pills for anxiety; if I don't take the pills I'm suppose to take I'll have them" (12/23/2014)  . Type II diabetes mellitus [facility])     Patient Active Problem List   Diagnosis Date Noted  . Protein-calorie malnutrition, severe 12/24/2014  . Chest pain, pleuritic 12/23/2014  . Homelessness 12/23/2014  . Hypertensive urgency 12/23/2014  . DM (diabetes mellitus) (HCC) 12/23/2014  . Intermittent palpitations 12/23/2014  . Tobacco abuse 12/23/2014  . Pleuritic chest pain 12/23/2014  . Abnormal EKG 12/23/2014  . Cough   . Diabetes mellitus with complication [facility])     Past Surgical History:  Procedure Laterality Date  . NO PAST SURGERIES         Home Medications    Prior to Admission  medications   Medication Sig Start Date End Date Taking? Authorizing Provider  amLODipine (NORVASC) 10 MG tablet Take 1 tablet (10 mg total) by mouth daily. 02/20/16   Emi Holes, PA-C  atorvastatin (LIPITOR) 20 MG tablet Take 20 mg by mouth daily.    Historical Provider, MD  clonazePAM (KLONOPIN) 1 MG tablet Take 1 mg by mouth daily.    Historical Provider, MD  hydrOXYzine (ATARAX/VISTARIL) 25 MG tablet Take 1 tablet (25 mg total) by mouth every 6 (six) hours as needed for anxiety. 04/17/16   Maia Plan, MD  insulin NPH-regular Human (NOVOLIN 70/30) (70-30) 100 UNIT/ML injection Inject 20 Units into the skin daily.     Historical Provider, MD  lisinopril (PRINIVIL,ZESTRIL) 20 MG tablet Take 1 tablet (20 mg total) by mouth daily. 02/20/16   Maia Plan, MD    Family History No family history on file.  Social History Social History  Substance Use Topics  . Smoking status: Current Every Day Smoker    Packs/day: 0.10    Years: 32.00    Types: Cigarettes  . Smokeless tobacco: Never Used  . Alcohol use 1.2 oz/week    2 Cans of beer per week     Allergies   Ibuprofen and Tylenol [acetaminophen]   Review of Systems Review of Systems  All other systems reviewed and are  negative.    Physical Exam Updated Vital Signs BP (!) 174/82 (BP Location: Left Arm)   Pulse 63   Temp 98.8 F (37.1 C) (Oral)   Resp 20   SpO2 100%   Physical Exam  Constitutional: He is oriented to person, place, and time. He appears well-developed and well-nourished.  HENT:  Head: Normocephalic and atraumatic.  Eyes: Conjunctivae are normal. Pupils are equal, round, and reactive to light. Right eye exhibits no discharge. Left eye exhibits no discharge. No scleral icterus.  Neck: Normal range of motion. No JVD present. No tracheal deviation present.  Pulmonary/Chest: Effort normal. No stridor.  Abdominal: Soft. He exhibits no distension and no mass. There is no tenderness. There is no rebound and no  guarding. No hernia.  Neurological: He is alert and oriented to person, place, and time. Coordination normal.  Psychiatric: He has a normal mood and affect. His behavior is normal. Judgment and thought content normal.  Nursing note and vitals reviewed.    ED Treatments / Results  Labs (all labs ordered are listed, but only abnormal results are displayed) Labs Reviewed - No data to display  EKG  EKG Interpretation None       Radiology No results found.  Procedures Procedures (including critical care time)  Medications Ordered in ED Medications - No data to display   Initial Impression / Assessment and Plan / ED Course  I have reviewed the triage vital signs and the nursing notes.  Pertinent labs & imaging results that were available during my care of the patient were reviewed by me and considered in my medical decision making (see chart for details).      Final Clinical Impressions(s) / ED Diagnoses   Final diagnoses:  Diarrhea, unspecified type  Hypertension, unspecified type     Assessment/Plan: 56 year old male presents today with reports of diarrhea.  He reports several episodes over the last few days.  This does not appear to be infectious in nature.  He is well-appearing afebrile no acute distress.  He has no abdominal pain or recent antibiotic exposure.  Well-hydrated with no vital sign abnormalities other than hypertension.  Patient is currently on antihypertensive medication.  Patient will follow up with primary care, he reports he is staying in [place] and has a place to stay.  He is given strict return precautions, he verbalized understanding and agreement to today's plan had no further questions or concerns      New Prescriptions New Prescriptions   No medications on file     Eyvonne Mechanic, PA-C 05/12/16 1135    Derwood Kaplan, MD 05/13/16 1233

## 2016-05-12 NOTE — Discharge Instructions (Signed)
Please read attached information. If you experience any new or worsening signs or symptoms please return to the emergency room for evaluation. Please follow-up with your primary care provider or specialist as discussed. Please use medication prescribed only as directed and discontinue taking if you have any concerning signs or symptoms.   °

## 2016-05-14 ENCOUNTER — Emergency Department (HOSPITAL_COMMUNITY): Payer: Self-pay

## 2016-05-14 ENCOUNTER — Encounter (HOSPITAL_COMMUNITY): Payer: Self-pay | Admitting: Emergency Medicine

## 2016-05-14 ENCOUNTER — Emergency Department (HOSPITAL_COMMUNITY)
Admission: EM | Admit: 2016-05-14 | Discharge: 2016-05-14 | Disposition: A | Discharge status: Home / Self Care | Payer: Self-pay | Attending: Emergency Medicine | Admitting: Emergency Medicine

## 2016-05-14 DIAGNOSIS — E162 Hypoglycemia, unspecified: Secondary | ICD-10-CM

## 2016-05-14 DIAGNOSIS — Z79899 Other long term (current) drug therapy: Secondary | ICD-10-CM | POA: Insufficient documentation

## 2016-05-14 DIAGNOSIS — F17210 Nicotine dependence, cigarettes, uncomplicated: Secondary | ICD-10-CM | POA: Insufficient documentation

## 2016-05-14 DIAGNOSIS — E11649 Type 2 diabetes mellitus with hypoglycemia without coma: Secondary | ICD-10-CM | POA: Insufficient documentation

## 2016-05-14 DIAGNOSIS — Z794 Long term (current) use of insulin: Secondary | ICD-10-CM | POA: Insufficient documentation

## 2016-05-14 DIAGNOSIS — R410 Disorientation, unspecified: Secondary | ICD-10-CM

## 2016-05-14 DIAGNOSIS — I10 Essential (primary) hypertension: Secondary | ICD-10-CM | POA: Insufficient documentation

## 2016-05-14 LAB — URINALYSIS, ROUTINE W REFLEX MICROSCOPIC
Bilirubin Urine: NEGATIVE
Glucose, UA: NEGATIVE mg/dL
Ketones, ur: NEGATIVE mg/dL
Leukocytes, UA: NEGATIVE
Nitrite: NEGATIVE
PROTEIN: NEGATIVE mg/dL
SPECIFIC GRAVITY, URINE: 1.012 (ref 1.005–1.030)
SQUAMOUS EPITHELIAL / LPF: NONE SEEN
pH: 5 (ref 5.0–8.0)

## 2016-05-14 LAB — CBG MONITORING, ED
GLUCOSE-CAPILLARY: 117 mg/dL — AB (ref 65–99)
GLUCOSE-CAPILLARY: 37 mg/dL — AB (ref 65–99)
GLUCOSE-CAPILLARY: 75 mg/dL (ref 65–99)
GLUCOSE-CAPILLARY: 89 mg/dL (ref 65–99)
GLUCOSE-CAPILLARY: 100 mg/dL — AB (ref 65–99)
GLUCOSE-CAPILLARY: 71 mg/dL (ref 65–99)
GLUCOSE-CAPILLARY: 187 mg/dL — AB (ref 65–99)
Glucose-Capillary: 101 mg/dL — ABNORMAL HIGH (ref 65–99)
Glucose-Capillary: 117 mg/dL — ABNORMAL HIGH (ref 65–99)
Glucose-Capillary: 264 mg/dL — ABNORMAL HIGH (ref 65–99)

## 2016-05-14 LAB — COMPREHENSIVE METABOLIC PANEL
ALBUMIN: 3.3 g/dL — AB (ref 3.5–5.0)
ALK PHOS: 68 U/L (ref 38–126)
ALT: 28 U/L (ref 17–63)
AST: 30 U/L (ref 15–41)
Anion gap: 8 (ref 5–15)
BILIRUBIN TOTAL: 0.4 mg/dL (ref 0.3–1.2)
BUN: 12 mg/dL (ref 6–20)
CALCIUM: 8.8 mg/dL — AB (ref 8.9–10.3)
CO2: 25 mmol/L (ref 22–32)
Chloride: 105 mmol/L (ref 101–111)
Creatinine, Ser: 0.79 mg/dL (ref 0.61–1.24)
GFR calc Af Amer: >60 mL/min (ref >60)
GFR calc non Af Amer: >60 mL/min (ref >60)
GLUCOSE: 178 mg/dL — AB (ref 65–99)
Potassium: 3.4 mmol/L — ABNORMAL LOW (ref 3.5–5.1)
Sodium: 138 mmol/L (ref 135–145)
TOTAL PROTEIN: 6.2 g/dL — AB (ref 6.5–8.1)

## 2016-05-14 LAB — POC OCCULT BLOOD, ED: FECAL OCCULT BLD: NEGATIVE

## 2016-05-14 LAB — RAPID URINE DRUG SCREEN, HOSP PERFORMED
Amphetamines: NOT DETECTED
BARBITURATES: NOT DETECTED
Benzodiazepines: NOT DETECTED
COCAINE: NOT DETECTED
Opiates: NOT DETECTED
Tetrahydrocannabinol: NOT DETECTED

## 2016-05-14 LAB — CBC WITH DIFFERENTIAL/PLATELET
Basophils Absolute: 0 10*3/uL (ref 0.0–0.1)
Basophils Relative: 0 %
EOS PCT: 3 %
Eosinophils Absolute: 0.2 10*3/uL (ref 0.0–0.7)
HCT: 27.3 % — ABNORMAL LOW (ref 39.0–52.0)
Hemoglobin: 8.5 g/dL — ABNORMAL LOW (ref 13.0–17.0)
LYMPHS PCT: 46 %
Lymphs Abs: 3 10*3/uL (ref 0.7–4.0)
MCH: 24.6 pg — ABNORMAL LOW (ref 26.0–34.0)
MCHC: 31.1 g/dL (ref 30.0–36.0)
MCV: 78.9 fL (ref 78.0–100.0)
MONO ABS: 0.6 10*3/uL (ref 0.1–1.0)
Monocytes Relative: 8 %
Neutro Abs: 2.9 10*3/uL (ref 1.7–7.7)
Neutrophils Relative %: 43 %
PLATELETS: 221 10*3/uL (ref 150–400)
RBC: 3.46 MIL/uL — ABNORMAL LOW (ref 4.22–5.81)
RDW: 13.5 % (ref 11.5–15.5)
WBC: 6.7 10*3/uL (ref 4.0–10.5)

## 2016-05-14 LAB — ETHANOL: Alcohol, Ethyl (B): <5 mg/dL (ref <5)

## 2016-05-14 MED ORDER — SODIUM CHLORIDE 0.9 % IV BOLUS (SEPSIS)
500.0000 mL | Freq: Once | INTRAVENOUS | Status: AC
  Administered 2016-05-14: 500 mL via INTRAVENOUS

## 2016-05-14 MED ORDER — DEXTROSE-NACL 5-0.9 % IV SOLN
Freq: Once | INTRAVENOUS | Status: AC
  Administered 2016-05-14: 06:00:00 via INTRAVENOUS

## 2016-05-14 MED ORDER — DEXTROSE 50 % IV SOLN
INTRAVENOUS | Status: AC
  Filled 2016-05-14: qty 50

## 2016-05-14 MED ORDER — DEXTROSE 50 % IV SOLN
1.0000 | Freq: Once | INTRAVENOUS | Status: AC
  Administered 2016-05-14: 50 mL via INTRAVENOUS

## 2016-05-14 NOTE — ED Triage Notes (Signed)
Brought by em[facility] from [facility] for sudden onset of confusion while trying to use the bathroom.  Staff reported it lasted about 5 minutes.  When ems arrived patient was alert and oriented.  Denies having any pain.  Hx of seizures.  CBG-82.

## 2016-05-14 NOTE — ED Provider Notes (Signed)
[facility] Provider Note   CSN: [id number] Arrival date & time: 05/14/16  0032     History   Chief Complaint Chief Complaint  Patient presents with  . confusion    HPI Mike [name] is a 56 y.o. male.  HPI   Pt brought in for confusion.  States he is still confused.  Denies any pain, unable to elaborate on his confusion.  Feels generalized weakness, lightheaded.  Thinks he took an insulin shot.    Level V caveat for altered mental status.    Past Medical History:  Diagnosis Date  . Anxiety   . Anxiety   . Chronic lower back pain   . Depression   . Hyperlipemia   . Hypertension   . Migraine    "last one was ~ 4 yr ago" (12/23/2014)  . Seizures (HCC)    "related to pills for anxiety; if I don't take the pills I'm suppose to take I'll have them" (12/23/2014)  . Type II diabetes mellitus [facility])     Patient Active Problem List   Diagnosis Date Noted  . Protein-calorie malnutrition, severe 12/24/2014  . Chest pain, pleuritic 12/23/2014  . Homelessness 12/23/2014  . Hypertensive urgency 12/23/2014  . DM (diabetes mellitus) (HCC) 12/23/2014  . Intermittent palpitations 12/23/2014  . Tobacco abuse 12/23/2014  . Pleuritic chest pain 12/23/2014  . Abnormal EKG 12/23/2014  . Cough   . Diabetes mellitus with complication [facility])     Past Surgical History:  Procedure Laterality Date  . high cholesterol    . NO PAST SURGERIES         Home Medications    Prior to Admission medications   Medication Sig Start Date End Date Taking? Authorizing Provider  amLODipine (NORVASC) 10 MG tablet Take 1 tablet (10 mg total) by mouth daily. Patient not taking: Reported on 05/12/2016 02/20/16   Emi Holes, PA-C  cloNIDine (CATAPRES) 0.1 MG tablet Take 0.1 mg by mouth at bedtime.    Historical Provider, MD  hydrOXYzine (ATARAX/VISTARIL) 25 MG tablet Take 1 tablet (25 mg total) by mouth every 6 (six) hours as needed for anxiety. 04/17/16   Maia Plan, MD  insulin  NPH-regular Human (NOVOLIN 70/30) (70-30) 100 UNIT/ML injection Inject 20 Units into the skin daily.     Historical Provider, MD  lisinopril (PRINIVIL,ZESTRIL) 20 MG tablet Take 1 tablet (20 mg total) by mouth daily. Patient taking differently: Take 40 mg by mouth daily.  02/20/16   Maia Plan, MD    Family History No family history on file.  Social History Social History  Substance Use Topics  . Smoking status: Current Every Day Smoker    Packs/day: 0.10    Years: 32.00    Types: Cigarettes  . Smokeless tobacco: Never Used  . Alcohol use 1.2 oz/week    2 Cans of beer per week     Allergies   Ibuprofen and Tylenol [acetaminophen]   Review of Systems Review of Systems  Unable to perform ROS: Mental status change     Physical Exam Updated Vital Signs BP (!) 172/80 (BP Location: Right Arm)   Pulse (!) 56   Temp 98.6 F (37 C) (Oral)   Resp 14   Ht  (1.93 m)   Wt 74.8 kg   SpO2 100%   BMI 20.08 kg/m   Physical Exam  Constitutional: He appears well-developed and well-nourished. No distress.  Profuse diaphoresis   HENT:  Head: Normocephalic and atraumatic.  Neck:  Neck supple.  Cardiovascular: Normal rate and regular rhythm.   Pulmonary/Chest: Effort normal and breath sounds normal. No respiratory distress. He has no wheezes. He has no rales.  Abdominal: Soft. He exhibits no distension and no mass. There is no tenderness. There is no rebound and no guarding.  Neurological: He is alert. He exhibits normal muscle tone.  CN II-XII intact, EOMs intact, no pronator drift, grip strengths equal bilaterally; strength 5/5 in all extremities, sensation intact in all extremities; finger to nose is normal.     Knows he is in the Emergency Department.  Cannot tell me the day or date, thinks it's the year 2000.  Does know Trump is president.    Skin: He is diaphoretic.  Nursing note and vitals reviewed.    ED Treatments / Results  Labs (all labs ordered are listed,  but only abnormal results are displayed) Labs Reviewed  COMPREHENSIVE METABOLIC PANEL - Abnormal; Notable for the following:       Result Value   Potassium 3.4 (*)    Glucose, Bld 178 (*)    Calcium 8.8 (*)    Total Protein 6.2 (*)    Albumin 3.3 (*)    All other components within normal limits  CBC WITH DIFFERENTIAL/PLATELET - Abnormal; Notable for the following:    RBC 3.46 (*)    Hemoglobin 8.5 (*)    HCT 27.3 (*)    MCH 24.6 (*)    All other components within normal limits  URINALYSIS, ROUTINE W REFLEX MICROSCOPIC - Abnormal; Notable for the following:    Hgb urine dipstick MODERATE (*)    Bacteria, UA RARE (*)    All other components within normal limits  CBG MONITORING, ED - Abnormal; Notable for the following:    Glucose-Capillary 37 (*)    All other components within normal limits  CBG MONITORING, ED - Abnormal; Notable for the following:    Glucose-Capillary 117 (*)    All other components within normal limits  CBG MONITORING, ED - Abnormal; Notable for the following:    Glucose-Capillary 117 (*)    All other components within normal limits  CBG MONITORING, ED - Abnormal; Notable for the following:    Glucose-Capillary 101 (*)    All other components within normal limits  CBG MONITORING, ED - Abnormal; Notable for the following:    Glucose-Capillary 100 (*)    All other components within normal limits  URINE CULTURE  ETHANOL  RAPID URINE DRUG SCREEN, HOSP PERFORMED  POC OCCULT BLOOD, ED  CBG MONITORING, ED  CBG MONITORING, ED  CBG MONITORING, ED  CBG MONITORING, ED    EKG  EKG Interpretation  Date/Time:  Sunday May 14 2016 01:52:03 EDT Ventricular Rate:  71 PR Interval:    QRS Duration: 94 QT Interval:  406 QTC Calculation: 442 R Axis:   50 Text Interpretation:  Sinus rhythm Borderline repolarization abnormality Borderline ST elevation, anterolateral leads Baseline wander in lead(s) V6 No significant change since last tracing Confirmed by Erroll Luna [phone]) on 05/14/2016 3:49:01 AM       Radiology Dg Chest 2 View  Result Date: 05/14/2016 CLINICAL DATA:  Hypoglycemia.  History of diabetes. EXAM: CHEST  2 VIEW COMPARISON:  Chest radiograph February 17, 2015 FINDINGS: Cardiac silhouette is mildly enlarged and unchanged. Mediastinal silhouette is nonsuspicious. Mild interstitial prominence without pleural effusion or focal consolidation. No pneumothorax. Soft tissue planes and included osseous structures are nonsuspicious. IMPRESSION: Stable mild cardiomegaly. Mild interstitial prominence, which can  be seen with atypical infection or pulmonary edema without focal consolidation. Electronically Signed   By: Awilda Metro M.D.   On: 05/14/2016 04:33   Ct Head Wo Contrast  Result Date: 05/14/2016 CLINICAL DATA:  Sudden onset of confusion lasting 5 minutes. EXAM: CT HEAD WITHOUT CONTRAST TECHNIQUE: Contiguous axial images were obtained from the base of the skull through the vertex without intravenous contrast. COMPARISON:  None. FINDINGS: Brain: A moderate degree of small vessel ischemic disease of periventricular and subcortical white matter is seen with mild superficial and mild-to-moderate central atrophy for age. No large vascular territory infarction, hemorrhage or midline shift. No intra-axial mass nor extra-axial collections. Vascular: No hyperdense vessels or unexpected calcifications. Skull: Negative Sinuses/Orbits: Intact orbits. Small mucous retention cyst in the anterior aspect of the right maxillary sinus. No acute sinus disease. Mastoids are clear bilaterally. Other: None IMPRESSION: Atrophy with moderate degree of small vessel ischemic disease of periventricular and subcortical white matter matter. No acute intracranial appearing abnormality is identified. Electronically Signed   By: Tollie Eth M.D.   On: 05/14/2016 03:03    Procedures Procedures (including critical care time)  Medications Ordered in ED Medications  sodium  chloride 0.9 % bolus 500 mL (0 mLs Intravenous Stopped 05/14/16 0503)  dextrose 50 % solution 50 mL (50 mLs Intravenous Given 05/14/16 0151)  dextrose 5 %-0.9 % sodium chloride infusion ( Intravenous Stopped 05/14/16 0644)     Initial Impression / Assessment and Plan / ED Course  I have reviewed the triage vital signs and the nursing notes.  Pertinent labs & imaging results that were available during my care of the patient were reviewed by me and considered in my medical decision making (see chart for details).  Clinical Course as of May 14 713  Sun May 14, 2016  0150 Cbg 32  [EW]  0345 Pt no longer confused.  States it is Sunday, 2018.  Denies having taken any insulin today.    [EW]  [id number] I discussed pt with Dr Antionette Char who has reviewed patient's blood sugars and does not think pt warrants admission at this time.  Will stop IVF and continue to monitor CBG.    [EW]    Clinical Course User Index [EW] Trixie Dredge, PA-C    Afebrile nontoxic patient with hx diabetes on insulin brought to ED for confusion.  Found to have CBG 32, improved with D50.  CBG started to decrease again and pt was fed, then started to decrease again.  He never became hypoglycemic or altered again. Pt denies any other symptoms including infectious symptoms.  Pt does have decrease in Hgb but no apparent bleeding.  He does have microscopic blood in his urine only, none on rectal exam.   Attempted to admit pt for monitoring for blood sugars though Dr Antionette Char rightly pointed out that the blood sugars remained in the normal range.  Therefore plan to continue to monitor pt in the ED for two more blood sugar checks to ensure that he does not drop again.  Discussed pt and plan with Dr Mora Bellman.  Signed out to [employer], PA-C, at change of shift pending CBG rechecks.    Final Clinical Impressions(s) / ED Diagnoses   Final diagnoses:  Confusion  Hypoglycemia    New Prescriptions New Prescriptions   No medications on file     [place], PA-C 05/14/16 2956    Tomasita Crumble, MD 05/14/16 1457

## 2016-05-14 NOTE — ED Notes (Signed)
Patient transported to X-ray 

## 2016-05-14 NOTE — ED Provider Notes (Signed)
Pt hypoglycemic.   Pt needs repeat cbg at 7:30 at 8:30.  Pt may go home if glucose stabilizes.  Pt able to eat and drink.   Glucose remains normal.  Pt ate breakfast. rexam  Vitals normal.  Pt ambulates with no difficulty   Elson Areas, PA-C 05/14/16 0706    Elson Areas, PA-C 05/14/16 0949    Elson Areas, PA-C 05/14/16 4782    Tomasita Crumble, MD 05/14/16 [phone]

## 2016-05-14 NOTE — ED Notes (Signed)
Pt given 240 mL orange juice, graham crackers and peaniut butter.  This RN watched while pt ate.

## 2016-05-14 NOTE — ED Notes (Signed)
CBG 187 

## 2016-05-14 NOTE — Discharge Instructions (Signed)
Return if any problems.

## 2016-05-14 NOTE — ED Notes (Signed)
CBG 71.  

## 2016-05-14 NOTE — ED Notes (Signed)
Pt notified of discharge. IV removed and lunch provided. Pt getting dressed at this time.

## 2016-05-14 NOTE — ED Notes (Signed)
Pt ambulated to restroom with steady gait, tolerated well.  Pt returned to bed and is eating breakfast

## 2016-05-15 LAB — URINE CULTURE: CULTURE: NO GROWTH

## 2016-05-25 ENCOUNTER — Emergency Department (HOSPITAL_COMMUNITY): Payer: Self-pay

## 2016-05-25 ENCOUNTER — Emergency Department (HOSPITAL_COMMUNITY)
Admission: EM | Admit: 2016-05-25 | Discharge: 2016-05-25 | Disposition: A | Discharge status: Home / Self Care | Payer: Self-pay | Attending: Emergency Medicine | Admitting: Emergency Medicine

## 2016-05-25 ENCOUNTER — Encounter (HOSPITAL_COMMUNITY): Payer: Self-pay | Admitting: *Deleted

## 2016-05-25 DIAGNOSIS — Z794 Long term (current) use of insulin: Secondary | ICD-10-CM | POA: Insufficient documentation

## 2016-05-25 DIAGNOSIS — F17210 Nicotine dependence, cigarettes, uncomplicated: Secondary | ICD-10-CM | POA: Insufficient documentation

## 2016-05-25 DIAGNOSIS — R739 Hyperglycemia, unspecified: Secondary | ICD-10-CM

## 2016-05-25 DIAGNOSIS — E1165 Type 2 diabetes mellitus with hyperglycemia: Secondary | ICD-10-CM | POA: Insufficient documentation

## 2016-05-25 DIAGNOSIS — I10 Essential (primary) hypertension: Secondary | ICD-10-CM | POA: Insufficient documentation

## 2016-05-25 DIAGNOSIS — Z760 Encounter for issue of repeat prescription: Secondary | ICD-10-CM | POA: Insufficient documentation

## 2016-05-25 DIAGNOSIS — Z79899 Other long term (current) drug therapy: Secondary | ICD-10-CM | POA: Insufficient documentation

## 2016-05-25 LAB — URINALYSIS, ROUTINE W REFLEX MICROSCOPIC
Bilirubin Urine: NEGATIVE
HGB URINE DIPSTICK: NEGATIVE
Ketones, ur: NEGATIVE mg/dL
Leukocytes, UA: NEGATIVE
Nitrite: NEGATIVE
PH: 6 (ref 5.0–8.0)
PROTEIN: NEGATIVE mg/dL
Specific Gravity, Urine: 1.028 (ref 1.005–1.030)

## 2016-05-25 LAB — BASIC METABOLIC PANEL
Anion gap: 11 (ref 5–15)
BUN: 15 mg/dL (ref 6–20)
CALCIUM: 9.2 mg/dL (ref 8.9–10.3)
CO2: 25 mmol/L (ref 22–32)
CREATININE: 0.98 mg/dL (ref 0.61–1.24)
Chloride: 91 mmol/L — ABNORMAL LOW (ref 101–111)
GFR calc non Af Amer: >60 mL/min (ref >60)
Glucose, Bld: 502 mg/dL (ref 65–99)
Potassium: 4.7 mmol/L (ref 3.5–5.1)
SODIUM: 127 mmol/L — AB (ref 135–145)

## 2016-05-25 LAB — CBG MONITORING, ED
GLUCOSE-CAPILLARY: 361 mg/dL — AB (ref 65–99)
GLUCOSE-CAPILLARY: 336 mg/dL — AB (ref 65–99)
GLUCOSE-CAPILLARY: 294 mg/dL — AB (ref 65–99)
Glucose-Capillary: 476 mg/dL — ABNORMAL HIGH (ref 65–99)

## 2016-05-25 LAB — CBC
HCT: 32.9 % — ABNORMAL LOW (ref 39.0–52.0)
Hemoglobin: 10.5 g/dL — ABNORMAL LOW (ref 13.0–17.0)
MCH: 24.9 pg — ABNORMAL LOW (ref 26.0–34.0)
MCHC: 31.9 g/dL (ref 30.0–36.0)
MCV: 78 fL (ref 78.0–100.0)
PLATELETS: 239 10*3/uL (ref 150–400)
RBC: 4.22 MIL/uL (ref 4.22–5.81)
RDW: 12.8 % (ref 11.5–15.5)
WBC: 5 10*3/uL (ref 4.0–10.5)

## 2016-05-25 MED ORDER — SODIUM CHLORIDE 0.9 % IV BOLUS (SEPSIS)
2500.0000 mL | Freq: Once | INTRAVENOUS | Status: AC
  Administered 2016-05-25: 2500 mL via INTRAVENOUS

## 2016-05-25 MED ORDER — INSULIN NPH ISOPHANE & REGULAR (70-30) 100 UNIT/ML ~~LOC~~ SUSP: 20.0000 [IU] | Freq: Every day | SUBCUTANEOUS | 3 refills | Status: DC

## 2016-05-25 MED ORDER — SODIUM CHLORIDE 0.9 % IV SOLN
INTRAVENOUS | Status: DC
  Administered 2016-05-25: 2.8 [IU]/h via INTRAVENOUS
  Filled 2016-05-25: qty 1

## 2016-05-25 MED ORDER — DEXTROSE-NACL 5-0.45 % IV SOLN: INTRAVENOUS | Status: DC

## 2016-05-25 MED ORDER — INSULIN PEN NEEDLE 30G X 8 MM MISC: 1.0000 | 3 refills | Status: DC | PRN
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
## 2016-05-25 NOTE — ED Notes (Signed)
Date and time results received: 05/25/16 1:36 PM   Test: Glucose  Critical Value: 502  Name of Provider Notified: Abby PA

## 2016-05-25 NOTE — Discharge Planning (Signed)
[facility].  [facility] Mike [name](Mike [name]) who is very familiar with this pt.  Mike AbrahamsMary Ann advised to have [place] come in on Monday [place].  As for Rx today, [facility]    EDCM spoke with pt at bedside, gave him instructions for Rx pick up today and to visit [facility] on Monday.  [facility] understanding.  No further CM needs identified at this time.

## 2016-05-25 NOTE — ED Notes (Signed)
Pt CBG was 476, notified Sabrina(RN)

## 2016-05-25 NOTE — ED Notes (Signed)
Pt ambulated to room from waiting area with a steady gait and no complaints.

## 2016-05-25 NOTE — ED Notes (Signed)
cbg was 336

## 2016-05-25 NOTE — Discharge Instructions (Signed)
[facility], Get help right away if: You have difficulty breathing. You have a change in how you think, feel, or act (mental status). You have nausea or vomiting that does not go away.
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
## 2016-06-24 ENCOUNTER — Emergency Department (HOSPITAL_COMMUNITY): Payer: Self-pay

## 2016-06-24 ENCOUNTER — Inpatient Hospital Stay (HOSPITAL_COMMUNITY)
Admission: EM | Admit: 2016-06-24 | Discharge: 2016-06-28 | DRG: 637 | Disposition: A | Discharge status: Home / Self Care | Payer: Self-pay | Attending: Internal Medicine | Admitting: Internal Medicine

## 2016-06-24 ENCOUNTER — Inpatient Hospital Stay (HOSPITAL_COMMUNITY): Payer: Self-pay

## 2016-06-24 ENCOUNTER — Encounter (HOSPITAL_COMMUNITY): Payer: Self-pay | Admitting: *Deleted

## 2016-06-24 DIAGNOSIS — F419 Anxiety disorder, unspecified: Secondary | ICD-10-CM | POA: Diagnosis present

## 2016-06-24 DIAGNOSIS — E1110 Type 2 diabetes mellitus with ketoacidosis without coma: Principal | ICD-10-CM | POA: Diagnosis present

## 2016-06-24 DIAGNOSIS — R7989 Other specified abnormal findings of blood chemistry: Secondary | ICD-10-CM | POA: Diagnosis present

## 2016-06-24 DIAGNOSIS — M545 Low back pain: Secondary | ICD-10-CM | POA: Diagnosis present

## 2016-06-24 DIAGNOSIS — N179 Acute kidney failure, unspecified: Secondary | ICD-10-CM | POA: Diagnosis present

## 2016-06-24 DIAGNOSIS — R112 Nausea with vomiting, unspecified: Secondary | ICD-10-CM | POA: Diagnosis present

## 2016-06-24 DIAGNOSIS — G8929 Other chronic pain: Secondary | ICD-10-CM | POA: Diagnosis present

## 2016-06-24 DIAGNOSIS — I160 Hypertensive urgency: Secondary | ICD-10-CM | POA: Diagnosis present

## 2016-06-24 DIAGNOSIS — E43 Unspecified severe protein-calorie malnutrition: Secondary | ICD-10-CM | POA: Diagnosis present

## 2016-06-24 DIAGNOSIS — Z886 Allergy status to analgesic agent status: Secondary | ICD-10-CM

## 2016-06-24 DIAGNOSIS — R778 Other specified abnormalities of plasma proteins: Secondary | ICD-10-CM | POA: Diagnosis present

## 2016-06-24 DIAGNOSIS — R000 Tachycardia, unspecified: Secondary | ICD-10-CM | POA: Diagnosis present

## 2016-06-24 DIAGNOSIS — I10 Essential (primary) hypertension: Secondary | ICD-10-CM | POA: Diagnosis present

## 2016-06-24 DIAGNOSIS — Z79899 Other long term (current) drug therapy: Secondary | ICD-10-CM

## 2016-06-24 DIAGNOSIS — R059 Cough, unspecified: Secondary | ICD-10-CM

## 2016-06-24 DIAGNOSIS — G9349 Other encephalopathy: Secondary | ICD-10-CM | POA: Diagnosis present

## 2016-06-24 DIAGNOSIS — I248 Other forms of acute ischemic heart disease: Secondary | ICD-10-CM | POA: Diagnosis present

## 2016-06-24 DIAGNOSIS — Z720 Tobacco use: Secondary | ICD-10-CM | POA: Diagnosis present

## 2016-06-24 DIAGNOSIS — E860 Dehydration: Secondary | ICD-10-CM | POA: Diagnosis present

## 2016-06-24 DIAGNOSIS — Z681 Body mass index (BMI) 19 or less, adult: Secondary | ICD-10-CM

## 2016-06-24 DIAGNOSIS — R05 Cough: Secondary | ICD-10-CM

## 2016-06-24 DIAGNOSIS — F17210 Nicotine dependence, cigarettes, uncomplicated: Secondary | ICD-10-CM | POA: Diagnosis present

## 2016-06-24 DIAGNOSIS — I422 Other hypertrophic cardiomyopathy: Secondary | ICD-10-CM

## 2016-06-24 DIAGNOSIS — Z9114 Patient's other noncompliance with medication regimen: Secondary | ICD-10-CM

## 2016-06-24 DIAGNOSIS — E785 Hyperlipidemia, unspecified: Secondary | ICD-10-CM | POA: Diagnosis present

## 2016-06-24 DIAGNOSIS — Z794 Long term (current) use of insulin: Secondary | ICD-10-CM

## 2016-06-24 LAB — BASIC METABOLIC PANEL
Anion gap: 14 (ref 5–15)
Anion gap: 10 (ref 5–15)
BUN: 54 mg/dL — AB (ref 6–20)
BUN: 54 mg/dL — ABNORMAL HIGH (ref 6–20)
CHLORIDE: 106 mmol/L (ref 101–111)
CHLORIDE: 112 mmol/L — AB (ref 101–111)
CO2: 22 mmol/L (ref 22–32)
CO2: 24 mmol/L (ref 22–32)
CREATININE: 1.9 mg/dL — AB (ref 0.61–1.24)
CREATININE: 1.77 mg/dL — AB (ref 0.61–1.24)
Calcium: 9.5 mg/dL (ref 8.9–10.3)
Calcium: 9.9 mg/dL (ref 8.9–10.3)
GFR calc non Af Amer: 38 mL/min — ABNORMAL LOW (ref >60)
GFR calc non Af Amer: 42 mL/min — ABNORMAL LOW (ref >60)
GFR, EST AFRICAN AMERICAN: 45 mL/min — AB (ref >60)
GFR, EST AFRICAN AMERICAN: 48 mL/min — AB (ref >60)
Glucose, Bld: 507 mg/dL (ref 65–99)
Glucose, Bld: 267 mg/dL — ABNORMAL HIGH (ref 65–99)
Potassium: 3.5 mmol/L (ref 3.5–5.1)
Potassium: 3.6 mmol/L (ref 3.5–5.1)
Sodium: 142 mmol/L (ref 135–145)
Sodium: 146 mmol/L — ABNORMAL HIGH (ref 135–145)

## 2016-06-24 LAB — DIFFERENTIAL
BASOS ABS: 0 10*3/uL (ref 0.0–0.1)
Basophils Relative: 0 %
Eosinophils Absolute: 0 10*3/uL (ref 0.0–0.7)
Eosinophils Relative: 0 %
LYMPHS PCT: 11 %
Lymphs Abs: 1.3 10*3/uL (ref 0.7–4.0)
MONO ABS: 0.8 10*3/uL (ref 0.1–1.0)
Monocytes Relative: 7 %
NEUTROS ABS: 9.2 10*3/uL — AB (ref 1.7–7.7)
NEUTROS PCT: 82 %

## 2016-06-24 LAB — I-STAT VENOUS BLOOD GAS, ED
Acid-base deficit: 5 mmol/L — ABNORMAL HIGH (ref 0.0–2.0)
Bicarbonate: 20.6 mmol/L (ref 20.0–28.0)
O2 Saturation: 63 %
PCO2 VEN: 38.9 mmHg — AB (ref 44.0–60.0)
PO2 VEN: 35 mmHg (ref 32.0–45.0)
TCO2: 22 mmol/L (ref 0–100)
pH, Ven: 7.332 (ref 7.250–7.430)

## 2016-06-24 LAB — COMPREHENSIVE METABOLIC PANEL
ALBUMIN: 4.3 g/dL (ref 3.5–5.0)
ALK PHOS: 82 U/L (ref 38–126)
ALT: 13 U/L — AB (ref 17–63)
AST: 15 U/L (ref 15–41)
Anion gap: 24 — ABNORMAL HIGH (ref 5–15)
BUN: 66 mg/dL — ABNORMAL HIGH (ref 6–20)
CALCIUM: 9.5 mg/dL (ref 8.9–10.3)
CHLORIDE: 94 mmol/L — AB (ref 101–111)
CO2: 15 mmol/L — AB (ref 22–32)
CREATININE: 2.84 mg/dL — AB (ref 0.61–1.24)
GFR calc non Af Amer: 24 mL/min — ABNORMAL LOW (ref >60)
GFR, EST AFRICAN AMERICAN: 27 mL/min — AB (ref >60)
GLUCOSE: 1058 mg/dL — AB (ref 65–99)
Potassium: 5.2 mmol/L — ABNORMAL HIGH (ref 3.5–5.1)
SODIUM: 133 mmol/L — AB (ref 135–145)
Total Bilirubin: 1.8 mg/dL — ABNORMAL HIGH (ref 0.3–1.2)
Total Protein: 8.4 g/dL — ABNORMAL HIGH (ref 6.5–8.1)

## 2016-06-24 LAB — GLUCOSE, CAPILLARY
GLUCOSE-CAPILLARY: 135 mg/dL — AB (ref 65–99)
GLUCOSE-CAPILLARY: 260 mg/dL — AB (ref 65–99)
GLUCOSE-CAPILLARY: 409 mg/dL — AB (ref 65–99)
GLUCOSE-CAPILLARY: 528 mg/dL — AB (ref 65–99)
Glucose-Capillary: 356 mg/dL — ABNORMAL HIGH (ref 65–99)
Glucose-Capillary: 199 mg/dL — ABNORMAL HIGH (ref 65–99)

## 2016-06-24 LAB — MRSA PCR SCREENING: MRSA by PCR: NEGATIVE

## 2016-06-24 LAB — URINALYSIS, ROUTINE W REFLEX MICROSCOPIC
BILIRUBIN URINE: NEGATIVE
Glucose, UA: >=500 mg/dL — AB
Ketones, ur: 20 mg/dL — AB
LEUKOCYTES UA: NEGATIVE
Nitrite: NEGATIVE
PH: 5 (ref 5.0–8.0)
Protein, ur: 30 mg/dL — AB
SPECIFIC GRAVITY, URINE: 1.025 (ref 1.005–1.030)

## 2016-06-24 LAB — VITAMIN B12: VITAMIN B 12: 1115 pg/mL — AB (ref 180–914)

## 2016-06-24 LAB — CBC
HCT: 43.4 % (ref 39.0–52.0)
Hemoglobin: 13.7 g/dL (ref 13.0–17.0)
MCH: 25.4 pg — ABNORMAL LOW (ref 26.0–34.0)
MCHC: 31.6 g/dL (ref 30.0–36.0)
MCV: 80.5 fL (ref 78.0–100.0)
PLATELETS: 295 10*3/uL (ref 150–400)
RBC: 5.39 MIL/uL (ref 4.22–5.81)
RDW: 13 % (ref 11.5–15.5)
WBC: 11.2 10*3/uL — AB (ref 4.0–10.5)

## 2016-06-24 LAB — APTT: aPTT: 26 seconds (ref 24–36)

## 2016-06-24 LAB — PROTIME-INR
INR: 1.04
PROTHROMBIN TIME: 13.6 s (ref 11.4–15.2)

## 2016-06-24 LAB — CBG MONITORING, ED: Glucose-Capillary: 554 mg/dL (ref 65–99)

## 2016-06-24 LAB — I-STAT TROPONIN, ED: Troponin i, poc: 0.13 ng/mL (ref 0.00–0.08)

## 2016-06-24 LAB — PHOSPHORUS: PHOSPHORUS: 2.8 mg/dL (ref 2.5–4.6)

## 2016-06-24 LAB — TROPONIN I
Troponin I: 0.21 ng/mL (ref <0.03)
Troponin I: 0.25 ng/mL (ref <0.03)

## 2016-06-24 LAB — MAGNESIUM: Magnesium: 3.4 mg/dL — ABNORMAL HIGH (ref 1.7–2.4)

## 2016-06-24 MED ORDER — SODIUM CHLORIDE 0.9 % IV SOLN
INTRAVENOUS | Status: DC
  Administered 2016-06-24: 17:00:00 via INTRAVENOUS

## 2016-06-24 MED ORDER — SODIUM CHLORIDE 0.9 % IV SOLN
INTRAVENOUS | Status: DC
  Administered 2016-06-24: 5.4 [IU]/h via INTRAVENOUS
  Filled 2016-06-24 (×2): qty 1

## 2016-06-24 MED ORDER — CLONIDINE HCL 0.2 MG/24HR TD PTWK
0.2000 mg | MEDICATED_PATCH | TRANSDERMAL | Status: DC
  Filled 2016-06-24: qty 1

## 2016-06-24 MED ORDER — HYDRALAZINE HCL 20 MG/ML IJ SOLN
5.0000 mg | Freq: Once | INTRAMUSCULAR | Status: AC
  Administered 2016-06-24: 5 mg via INTRAVENOUS
  Filled 2016-06-24: qty 1

## 2016-06-24 MED ORDER — SODIUM CHLORIDE 0.9 % IV SOLN: INTRAVENOUS | Status: AC

## 2016-06-24 MED ORDER — DEXTROSE-NACL 5-0.45 % IV SOLN
INTRAVENOUS | Status: DC
  Administered 2016-06-24: 23:00:00 via INTRAVENOUS

## 2016-06-24 MED ORDER — SODIUM CHLORIDE 0.9 % IV SOLN: INTRAVENOUS | Status: DC

## 2016-06-24 MED ORDER — ONDANSETRON HCL 4 MG/2ML IJ SOLN
4.0000 mg | INTRAMUSCULAR | Status: DC
  Administered 2016-06-24 – 2016-06-25 (×2): 4 mg via INTRAVENOUS
  Filled 2016-06-24 (×2): qty 2

## 2016-06-24 MED ORDER — SODIUM CHLORIDE 0.9 % IV BOLUS (SEPSIS)
1000.0000 mL | Freq: Once | INTRAVENOUS | Status: AC
  Administered 2016-06-24: 1000 mL via INTRAVENOUS

## 2016-06-24 MED ORDER — HYDRALAZINE HCL 20 MG/ML IJ SOLN
5.0000 mg | INTRAMUSCULAR | Status: DC | PRN
  Administered 2016-06-24 – 2016-06-25 (×2): 5 mg via INTRAVENOUS
  Filled 2016-06-24: qty 1

## 2016-06-24 MED ORDER — METOCLOPRAMIDE HCL 5 MG/ML IJ SOLN
10.0000 mg | Freq: Three times a day (TID) | INTRAMUSCULAR | Status: DC
  Administered 2016-06-24 – 2016-06-25 (×2): 10 mg via INTRAVENOUS
  Filled 2016-06-24 (×2): qty 2

## 2016-06-24 MED ORDER — HEPARIN SODIUM (PORCINE) 5000 UNIT/ML IJ SOLN
5000.0000 [IU] | Freq: Three times a day (TID) | INTRAMUSCULAR | Status: DC
  Administered 2016-06-24 – 2016-06-28 (×10): 5000 [IU] via SUBCUTANEOUS
  Filled 2016-06-24 (×10): qty 1

## 2016-06-24 NOTE — H&P (Signed)
History and Physical    [name] XNA:[id number] DOB: [birth year] DOA: 06/24/2016  PCP: [name], [name], NP Patient coming from: homeless shelter  Chief Complaint: weakness/nausea/vomiting  HPI: Mike [name] is a 56 y.o. male with medical history significant of  diabetes type 2, seizures, hypertension, hyperlipidemia, chronic low back pain, anxiety, resistant to the emergency Department chief complaint of 3 day history of generalized weakness dizziness nausea and vomiting. Initial evaluation reveals diabetic ketoacidosis with an anion gap of 24, acute kidney injury, mild encephalopathy and hypertensive urgency  Information is obtained from the patient noting that information may be unreliable as he appears to be mildly encephalopathic. He reports not taking his insulin for the last week or so.Marland Kitchen. He also has not taken any of his blood pressure medicines. He reports he was "too weak to take the medicine". He reports that he "does the best he can" with compliance and his medications. Associated symptoms include intermittent headache intermittent dizziness nausea with intermittent vomiting. He denies coffee ground emesis. He denies chest pain palpitation shortness of breath. He denies dysuria hematuria frequency or urgency. He denies any diarrhea constipation melena bright red blood per rectum. He denies numbness tingling of his extremities difficulty chewing or swallowing.    ED Course: In the emergency department is afebrile hypertensive tachycardia he is not hypoxic. He is nontoxic appearing  Review of Systems: As per HPI otherwise all other systems reviewed and are negative.   Ambulatory Status: Ambulates independentlyand falls  Past Medical History:  Diagnosis Date  . Anxiety   . Anxiety   . Chronic lower back pain   . Depression   . Hyperlipemia   . Hypertension   . Migraine    "last one was ~ 4 yr ago" (12/23/2014)  . Seizures (HCC)    "related to pills for anxiety; if I  don't take the pills I'm suppose to take I'll have them" (12/23/2014)  . Type II diabetes mellitus (HCC)     Past Surgical History:  Procedure Laterality Date  . high cholesterol    . NO PAST SURGERIES      Social History   Social History  . Marital status: Divorced    Spouse name: N/A  . Number of children: N/A  . Years of education: N/A   Occupational History  . Not on file.   Social History Main Topics  . Smoking status: Current Every Day Smoker    Packs/day: 0.10    Years: 32.00    Types: Cigarettes  . Smokeless tobacco: Never Used  . Alcohol use 1.2 oz/week    2 Cans of beer per week  . Drug use: No     Comment: 12/23/2014 "stopped ~ 10 yrs ago"  . Sexual activity: Yes   Other Topics Concern  . Not on file   Social History Narrative  . No narrative on file    Allergies  Allergen Reactions  . Ibuprofen Nausea And Vomiting and Other (See Comments)    Reaction:  Bloating   . Tylenol [Acetaminophen] Nausea And Vomiting and Other (See Comments)    Reaction:  Bloating     History reviewed. No pertinent family history. Family medical history reviewed and nonpertinent to the admission of this gentleman  Prior to Admission medications   Medication Sig Start Date End Date Taking? Authorizing Provider  amLODipine (NORVASC) 10 MG tablet Take 1 tablet (10 mg total) by mouth daily. Patient not taking: Reported on 05/12/2016 02/20/16   Emi HolesLaw, Alexandra M, PA-C  cloNIDine (CATAPRES) 0.1 MG tablet Take 0.1 mg by mouth at bedtime.    [provider]  hydrOXYzine (ATARAX/VISTARIL) 25 MG tablet Take 1 tablet (25 mg total) by mouth every 6 (six) hours as needed for anxiety. 04/17/16   Long, Arlyss Repress, MD  insulin NPH-regular Human (NOVOLIN 70/30) (70-30) 100 UNIT/ML injection Inject 20 Units into the skin daily. 05/25/16   Harris, Cammy Copa, PA-C  Insulin Pen Needle (NOVOFINE) 30G X 8 MM MISC Inject 10 each into the skin as needed. 05/25/16   Harris, Abigail, PA-C  lisinopril  (PRINIVIL,ZESTRIL) 20 MG tablet Take 1 tablet (20 mg total) by mouth daily. Patient taking differently: Take 40 mg by mouth daily.  02/20/16   Maia Plan, MD    Physical Exam: Vitals:   06/24/16 1241 06/24/16 1345 06/24/16 1515  BP: (!) 171/101 (!) 190/91 (!) 196/75  Pulse: (!) 124 100   Resp: 18 (!) 22   Temp: 98.7 F (37.1 C)    TempSrc: Oral    SpO2: 97% 99%      General:  Appears calm and comfortable, resting with eyes closed no acute distress Eyes:  PERRL, EOMI, normal lids, iris ENT:  grossly normal hearing, lips & tongue, mucous membranes of his mouth are pink but dry Neck:  no LAD, masses or thyromegaly Cardiovascular:  Tachycardic but regular no m/r/g. No LE edema.  Respiratory:  CTA bilaterally, no w/r/r. Normal respiratory effort. Abdomen:  soft, ntnd, positive bowel sounds but sluggish no guarding or rebounding Skin:  no rash or induration seen on limited exam Musculoskeletal:  grossly normal tone BUE/BLE, good ROM, no bony abnormality Psychiatric:  grossly normal mood and affect, speech fluent and appropriate, AOx3 Neurologic:  CN 2-12 grossly intact, moves all extremities in coordinated fashion, sensation intact. He is alert he is oriented to self and place he follows commands he will answer simple questions  Labs on Admission: I have personally reviewed following labs and imaging studies  CBC:  Recent Labs Lab 06/24/16 1250  WBC 11.2*  NEUTROABS 9.2*  HGB 13.7  HCT 43.4  MCV 80.5  PLT 295   Basic Metabolic Panel:  Recent Labs Lab 06/24/16 1250  NA 133*  K 5.2*  CL 94*  CO2 15*  GLUCOSE 1,058*  BUN 66*  CREATININE 2.84*  CALCIUM 9.5   GFR: CrCl cannot be calculated (Unknown ideal weight.). Liver Function Tests:  Recent Labs Lab 06/24/16 1250  AST 15  ALT 13*  ALKPHOS 82  BILITOT 1.8*  PROT 8.4*  ALBUMIN 4.3   No results for input(s): LIPASE, AMYLASE in the last 168 hours. No results for input(s): AMMONIA in the last 168  hours. Coagulation Profile:  Recent Labs Lab 06/24/16 1400  INR 1.04   Cardiac Enzymes: No results for input(s): CKTOTAL, CKMB, CKMBINDEX, TROPONINI in the last 168 hours. BNP (last 3 results) No results for input(s): PROBNP in the last 8760 hours. HbA1C: No results for input(s): HGBA1C in the last 72 hours. CBG:  Recent Labs Lab 06/24/16 1505 06/24/16 1627  GLUCAP >600* >600*   Lipid Profile: No results for input(s): CHOL, HDL, LDLCALC, TRIG, CHOLHDL, LDLDIRECT in the last 72 hours. Thyroid Function Tests: No results for input(s): TSH, T4TOTAL, FREET4, T3FREE, THYROIDAB in the last 72 hours. Anemia Panel: No results for input(s): VITAMINB12, FOLATE, FERRITIN, TIBC, IRON, RETICCTPCT in the last 72 hours. Urine analysis:    Component Value Date/Time   COLORURINE YELLOW 05/25/2016 1219   APPEARANCEUR CLEAR 05/25/2016 1219  LABSPEC 1.028 05/25/2016 1219   PHURINE 6.0 05/25/2016 1219   GLUCOSEU >=500 (A) 05/25/2016 1219   HGBUR NEGATIVE 05/25/2016 1219   BILIRUBINUR NEGATIVE 05/25/2016 1219   KETONESUR NEGATIVE 05/25/2016 1219   PROTEINUR NEGATIVE 05/25/2016 1219   UROBILINOGEN 1.0 02/07/2011 1605   NITRITE NEGATIVE 05/25/2016 1219   LEUKOCYTESUR NEGATIVE 05/25/2016 1219    Creatinine Clearance: CrCl cannot be calculated (Unknown ideal weight.).  Sepsis Labs: @LABRCNTIP (procalcitonin:4,lacticidven:4) )No results found for this or any previous visit (from the past 240 hour(s)).   Radiological Exams on Admission: Ct Head Wo Contrast  Result Date: 06/24/2016 CLINICAL DATA:  Dizziness for 3 days EXAM: CT HEAD WITHOUT CONTRAST TECHNIQUE: Contiguous axial images were obtained from the base of the skull through the vertex without intravenous contrast. COMPARISON:  05/14/2016 FINDINGS: Brain: Global atrophy. Chronic ischemic changes in the periventricular white matter. No mass effect, midline shift, or acute hemorrhage. Vascular: No hyperdense vessel or unexpected  calcification. Skull: Cranium is intact. Sinuses/Orbits: Mucous retention cysts are present in the right maxillary sinus. Mastoid air cells are clear. Other: None. IMPRESSION: Global atrophy and chronic ischemic changes. No acute intracranial pathology. Electronically Signed   By: Jolaine Click M.D.   On: 06/24/2016 13:43    EKG: Independently reviewed. Sinus tachycardia Biatrial enlargement Left ventricular hypertrophy Cannot rule out Septal infarct , age undetermined Abnormal ECG prolonged qtc st changes likely c/w LVH and repol changes and not new  Assessment/Plan Principal Problem:   DKA (diabetic ketoacidoses) (HCC) Active Problems:   Hypertensive urgency   Tobacco abuse   Protein-calorie malnutrition, severe   Acute encephalopathy   Elevated troponin   Acute kidney injury (HCC)   Nausea and vomiting   Tachycardia   #1. DKA. Likely related to noncompliance secondary to financial constraints and/or homelessness. Serum glucose greater than 1000. Anion gap 24. Bicarb 15. No signs of infectious process. In the emergency department he was provided with 1 L normal saline and insulin drip was initiated. Of note patient in the emergency department 1 month ago for same issue however resolved while in the ED and he was discharged from there. Home regimen includes 70/30. -Admit to step down -Nothing by mouth for now -Scheduled Zofran -Continue insulin drip per Glucomander -BMET every 4 hours -Transition IV fluids of D5 once CBG less than 2502 per protocol -Continue insulin drip until gap closes -Case manager -Diabetes coordinator  #2. Hypertension. Uncontrolled. Later to noncompliance with medications. Home medications include amlodipine, clonidine, lisinopril -We'll hold amlodipine and lisinopril for now -Provide clonidine patch -When necessary hydralazine -Monitor -Resume home meds once taking clear liquids  #3. Acute kidney injury. Related to #1. Creatinine 2.8. Was provided with 1 L  normal saline in the emergency department. -Vigorous IV fluids per DKA protocol -Hold nephrotoxins -Monitor urine output -Recheck in the morning  #4. Nausea and vomiting. Related to #1. Abdominal exam benign -Nothing by mouth -Scheduled Zofran -Reglan  #5. Acute encephalopathy. Likely related to above. CT of the head without acute abnormality. Neuro exam unremarkable -See #1 -Obtain a B-12 folate and rpr -frequent neuro checks  #6. Elevated troponin. Initial troponin 0.13. Likely related to demand in the setting of acute kidney injury and uncontrolled BP. EKG with sinus tachycardia mild LVH no new changes. Denies chest pain. Does not appear volume overloaded -Cycle troponin -Obtain an echo -BP controll -monitor  #7. Tachycardia. Likely related to above. Heart rate improving after IV fluids. Heart rate 99 on admission. Denies chest pain. -Monitor -Cycle troponin -  Serial EKG    DVT prophylaxis: heparin  Code Status: full  Family Communication: none present  Disposition Plan: shelter  Consults called: none  Admission status: inpatient    Gwenyth Bender MD [facility]  If 7PM-7AM, please contact night-coverage [email or web address] Password [facility]  06/24/2016, 4:43 PM

## 2016-06-24 NOTE — ED Notes (Signed)
Informed first nurse Scarlett of I-stat troponin 0.13

## 2016-06-24 NOTE — ED Provider Notes (Signed)
[facility]-EMERGENCY DEPT Provider Note   CSN: [id number] Arrival date & time: 06/24/16  1233     History   Chief Complaint Chief Complaint  Patient presents with  . Weakness  . Dizziness    HPI Mike [name] is a 56 y.o. male.  HPI  56 y.o. male with a hx of DM, HTN, HLD, presents to the Emergency Department today complaining of dizziness x 3 days as well as weakness. Notes decrease in PO intake and not taking Insulin regularly. States that he just did not feel like taking it due to weakness and decrease in PO intake. No active pain. No CP/SOB/ABD pain. Mild headache noted. No N/V. No visual changes.   HPI limited due to acuity of condition. Pt altered and giving differing stories to provider as well as nursing staff  Level V Caveat  Past Medical History:  Diagnosis Date  . Anxiety   . Anxiety   . Chronic lower back pain   . Depression   . Hyperlipemia   . Hypertension   . Migraine    "last one was ~ 4 yr ago" (12/23/2014)  . Seizures (HCC)    "related to pills for anxiety; if I don't take the pills I'm suppose to take I'll have them" (12/23/2014)  . Type II diabetes mellitus [facility])     Patient Active Problem List   Diagnosis Date Noted  . Protein-calorie malnutrition, severe 12/24/2014  . Chest pain, pleuritic 12/23/2014  . Homelessness 12/23/2014  . Hypertensive urgency 12/23/2014  . DM (diabetes mellitus) (HCC) 12/23/2014  . Intermittent palpitations 12/23/2014  . Tobacco abuse 12/23/2014  . Pleuritic chest pain 12/23/2014  . Abnormal EKG 12/23/2014  . Cough   . Diabetes mellitus with complication [facility])     Past Surgical History:  Procedure Laterality Date  . high cholesterol    . NO PAST SURGERIES         Home Medications    Prior to Admission medications   Medication Sig Start Date End Date Taking? Authorizing Provider  amLODipine (NORVASC) 10 MG tablet Take 1 tablet (10 mg total) by mouth daily. Patient not taking: Reported on 05/12/2016 02/20/16    Emi Holes, PA-C  cloNIDine (CATAPRES) 0.1 MG tablet Take 0.1 mg by mouth at bedtime.    [provider]  hydrOXYzine (ATARAX/VISTARIL) 25 MG tablet Take 1 tablet (25 mg total) by mouth every 6 (six) hours as needed for anxiety. 04/17/16   Long, Arlyss Repress, MD  insulin NPH-regular Human (NOVOLIN 70/30) (70-30) 100 UNIT/ML injection Inject 20 Units into the skin daily. 05/25/16   Harris, Cammy Copa, PA-C  Insulin Pen Needle (NOVOFINE) 30G X 8 MM MISC Inject 10 each into the skin as needed. 05/25/16   Harris, Abigail, PA-C  lisinopril (PRINIVIL,ZESTRIL) 20 MG tablet Take 1 tablet (20 mg total) by mouth daily. Patient taking differently: Take 40 mg by mouth daily.  02/20/16   Long, Arlyss Repress, MD    Family History History reviewed. No pertinent family history.  Social History Social History  Substance Use Topics  . Smoking status: Current Every Day Smoker    Packs/day: 0.10    Years: 32.00    Types: Cigarettes  . Smokeless tobacco: Never Used  . Alcohol use 1.2 oz/week    2 Cans of beer per week     Allergies   Ibuprofen and Tylenol [acetaminophen]   Review of Systems Review of Systems  Unable to perform ROS: Mental status change   Physical Exam Updated  Vital Signs BP (!) 171/101 (BP Location: Left Arm)   Pulse (!) 124   Temp 98.7 F (37.1 C) (Oral)   Resp 18   SpO2 97%   Physical Exam  Constitutional: He appears well-developed and well-nourished. No distress.  HENT:  Head: Normocephalic and atraumatic.  Right Ear: Tympanic membrane, external ear and ear canal normal.  Left Ear: Tympanic membrane, external ear and ear canal normal.  Nose: Nose normal.  Mouth/Throat: Uvula is midline, oropharynx is clear and moist and mucous membranes are normal. No trismus in the jaw. No oropharyngeal exudate, posterior oropharyngeal erythema or tonsillar abscesses.  Eyes: EOM are normal. Pupils are equal, round, and reactive to light.  Neck: Normal range of motion. Neck supple. No  tracheal deviation present.  Cardiovascular: Regular rhythm, S1 normal, S2 normal, normal heart sounds, intact distal pulses and normal pulses.  Tachycardia present.   Pulmonary/Chest: Effort normal and breath sounds normal. No respiratory distress. He has no decreased breath sounds. He has no wheezes. He has no rhonchi. He has no rales.  Abdominal: Normal appearance and bowel sounds are normal. There is no tenderness.  Musculoskeletal: Normal range of motion.  Neurological: He is alert. He has normal strength. He is disoriented. No sensory deficit.  Appears disoriented  Skin: Skin is warm and dry.  Psychiatric: He has a normal mood and affect. His speech is normal and behavior is normal. Thought content normal.   ED Treatments / Results  Labs (all labs ordered are listed, but only abnormal results are displayed) Labs Reviewed  CBC - Abnormal; Notable for the following:       Result Value   WBC 11.2 (*)    MCH 25.4 (*)    All other components within normal limits  DIFFERENTIAL - Abnormal; Notable for the following:    Neutro Abs 9.2 (*)    All other components within normal limits  COMPREHENSIVE METABOLIC PANEL - Abnormal; Notable for the following:    Sodium 133 (*)    Potassium 5.2 (*)    Chloride 94 (*)    CO2 15 (*)    Glucose, Bld 1,058 (*)    BUN 66 (*)    Creatinine, Ser 2.84 (*)    Total Protein 8.4 (*)    ALT 13 (*)    Total Bilirubin 1.8 (*)    GFR calc non Af Amer 24 (*)    GFR calc Af Amer 27 (*)    Anion gap 24 (*)    All other components within normal limits  I-STAT TROPOININ, ED - Abnormal; Notable for the following:    Troponin i, poc 0.13 (*)    All other components within normal limits  CBG MONITORING, ED - Abnormal; Notable for the following:    Glucose-Capillary >600 (*)    All other components within normal limits  PROTIME-INR  APTT  I-STAT VENOUS BLOOD GAS, ED    EKG  EKG Interpretation  Date/Time:  Saturday June 24 2016 12:44:20  EDT Ventricular Rate:  123 PR Interval:  128 QRS Duration: 78 QT Interval:  314 QTC Calculation: 449 R Axis:   83 Text Interpretation:  Sinus tachycardia Biatrial enlargement Left ventricular hypertrophy Cannot rule out Septal infarct , age undetermined Abnormal ECG prolonged qtc st changes likely c/w LVH and repol changes and not new Confirmed by Marily Memos [phone]) on 06/24/2016 1:01:30 PM       Radiology Ct Head Wo Contrast  Result Date: 06/24/2016 CLINICAL DATA:  Dizziness for 3  days EXAM: CT HEAD WITHOUT CONTRAST TECHNIQUE: Contiguous axial images were obtained from the base of the skull through the vertex without intravenous contrast. COMPARISON:  05/14/2016 FINDINGS: Brain: Global atrophy. Chronic ischemic changes in the periventricular white matter. No mass effect, midline shift, or acute hemorrhage. Vascular: No hyperdense vessel or unexpected calcification. Skull: Cranium is intact. Sinuses/Orbits: Mucous retention cysts are present in the right maxillary sinus. Mastoid air cells are clear. Other: None. IMPRESSION: Global atrophy and chronic ischemic changes. No acute intracranial pathology. Electronically Signed   By: Jolaine ClickArthur  Hoss M.D.   On: 06/24/2016 13:43   Procedures Procedures (including critical care time)  CRITICAL CARE Performed by: Eston Estersyler M Tekelia Kareem   Total critical care time: 60 minutes  Critical care time was exclusive of separately billable procedures and treating other patients.  Critical care was necessary to treat or prevent imminent or life-threatening deterioration.  Critical care was time spent personally by me on the following activities: development of treatment plan with patient and/or surrogate as well as nursing, discussions with consultants, evaluation of patient's response to treatment, examination of patient, obtaining history from patient or surrogate, ordering and performing treatments and interventions, ordering and review of laboratory studies,  ordering and review of radiographic studies, pulse oximetry and re-evaluation of patient's condition.   Medications Ordered in ED Medications  insulin regular (NOVOLIN R,HUMULIN R) 100 Units in sodium chloride 0.9 % 100 mL (1 Units/mL) infusion (not administered)  sodium chloride 0.9 % bolus 1,000 mL (not administered)   Initial Impression / Assessment and Plan / ED Course  I have reviewed the triage vital signs and the nursing notes.  Pertinent labs & imaging results that were available during my care of the patient were reviewed by me and considered in my medical decision making (see chart for details).  Final Clinical Impressions(s) / ED Diagnoses  {I have reviewed and evaluated the relevant laboratory values. {I have reviewed and evaluated the relevant imaging studies. {I have interpreted the relevant EKG. {I have reviewed the relevant previous healthcare records.  {I obtained HPI from historian.   ED Course:  Assessment: Pt is a 56 y.o. male with hx DM who presents with dizziness/headache x 2-3 days. On exam, pt in NAD. Appears disoriented. Nontoxic/nonseptic appearing. VS with tachycardia. Afebrile. Lungs CTA. Heart RRR. Abdomen nontender soft. CBC unremarkable. CMP with glucose >1000. Potassium 5.2. Cr. 2.84. Gap 24. VBG unremarkable. Started on Glucose Stabilizer and given Insulin. EKG WNL. Trop 0.13. Denies chest pain or shortness of breath. No hx ACS. CT Head unremarkable. Plan is to Admit to stepdown.   Disposition/Plan:  Admit Pt acknowledges and agrees with plan  Supervising Physician Lorre NickAllen, Anthony, MD  Final diagnoses:  Diabetic ketoacidosis without coma associated with type 2 diabetes mellitus (HCC)  AKI (acute kidney injury) [facility](HCC)    New Prescriptions New Prescriptions   No medications on file     Audry PiliMohr, Teneil Shiller, Cordelia Poche-C 06/24/16 1549    Lorre NickAllen, Anthony, MD 06/24/16 [phone]

## 2016-06-24 NOTE — ED Notes (Signed)
Patient in scan, not in room.

## 2016-06-24 NOTE — ED Notes (Signed)
Report given to Gabe. 

## 2016-06-24 NOTE — ED Notes (Signed)
Troponin 0.13

## 2016-06-24 NOTE — ED Triage Notes (Signed)
Pt is very vague about his complaint at triage. Reports dizziness x 3 days. No neuro deficits noted at triage. When asked about weakness in extremities, he states that he has weakness in his chest, states "its feeling out of place." HR elevated at 124 at triage. has hx of dm, htn, high chol but has not been taking his meds recently.

## 2016-06-25 ENCOUNTER — Inpatient Hospital Stay (HOSPITAL_COMMUNITY): Payer: Self-pay

## 2016-06-25 DIAGNOSIS — R748 Abnormal levels of other serum enzymes: Secondary | ICD-10-CM

## 2016-06-25 DIAGNOSIS — N179 Acute kidney failure, unspecified: Secondary | ICD-10-CM

## 2016-06-25 DIAGNOSIS — E1110 Type 2 diabetes mellitus with ketoacidosis without coma: Principal | ICD-10-CM

## 2016-06-25 DIAGNOSIS — G9340 Encephalopathy, unspecified: Secondary | ICD-10-CM

## 2016-06-25 DIAGNOSIS — I422 Other hypertrophic cardiomyopathy: Secondary | ICD-10-CM

## 2016-06-25 DIAGNOSIS — I160 Hypertensive urgency: Secondary | ICD-10-CM

## 2016-06-25 LAB — BASIC METABOLIC PANEL
ANION GAP: 11 (ref 5–15)
Anion gap: 11 (ref 5–15)
Anion gap: 12 (ref 5–15)
BUN: 51 mg/dL — ABNORMAL HIGH (ref 6–20)
BUN: 51 mg/dL — ABNORMAL HIGH (ref 6–20)
BUN: 47 mg/dL — AB (ref 6–20)
CALCIUM: 10.1 mg/dL (ref 8.9–10.3)
CALCIUM: 9.9 mg/dL (ref 8.9–10.3)
CHLORIDE: 115 mmol/L — AB (ref 101–111)
CHLORIDE: 114 mmol/L — AB (ref 101–111)
CO2: 26 mmol/L (ref 22–32)
CO2: 27 mmol/L (ref 22–32)
CO2: 23 mmol/L (ref 22–32)
CREATININE: 1.47 mg/dL — AB (ref 0.61–1.24)
Calcium: 10 mg/dL (ref 8.9–10.3)
Chloride: 116 mmol/L — ABNORMAL HIGH (ref 101–111)
Creatinine, Ser: 1.69 mg/dL — ABNORMAL HIGH (ref 0.61–1.24)
Creatinine, Ser: 1.7 mg/dL — ABNORMAL HIGH (ref 0.61–1.24)
GFR calc Af Amer: >60 mL/min (ref >60)
GFR calc non Af Amer: 44 mL/min — ABNORMAL LOW (ref >60)
GFR calc non Af Amer: 44 mL/min — ABNORMAL LOW (ref >60)
GFR, EST AFRICAN AMERICAN: 51 mL/min — AB (ref >60)
GFR, EST AFRICAN AMERICAN: 51 mL/min — AB (ref >60)
GFR, EST NON AFRICAN AMERICAN: 52 mL/min — AB (ref >60)
GLUCOSE: 158 mg/dL — AB (ref 65–99)
Glucose, Bld: 139 mg/dL — ABNORMAL HIGH (ref 65–99)
Glucose, Bld: 142 mg/dL — ABNORMAL HIGH (ref 65–99)
POTASSIUM: 3.8 mmol/L (ref 3.5–5.1)
POTASSIUM: 3.6 mmol/L (ref 3.5–5.1)
Potassium: 3.6 mmol/L (ref 3.5–5.1)
Sodium: 152 mmol/L — ABNORMAL HIGH (ref 135–145)
Sodium: 152 mmol/L — ABNORMAL HIGH (ref 135–145)
Sodium: 151 mmol/L — ABNORMAL HIGH (ref 135–145)

## 2016-06-25 LAB — ECHOCARDIOGRAM COMPLETE
AVLVOTPG: 5 mmHg
CHL CUP DOP CALC LVOT VTI: 20.6 cm
CHL CUP MV DEC (S): 218
E decel time: 218 msec
EERAT: 3.72
FS: 32 % (ref 28–44)
Height: 76 in
IV/PV OW: 1.13
LA ID, A-P, ES: 31 mm
LA diam end sys: 31 mm
LA diam index: 1.64 cm/m2
LA vol A4C: 33.6 ml
LA vol index: 22.8 mL/m2
LA vol: 43.1 mL
LV E/e'average: 3.72
LV TDI E'LATERAL: 12
LVEEMED: 3.72
LVELAT: 12 cm/s
LVOT SV: 85 mL
LVOT area: 4.15 cm2
LVOT diameter: 23 mm
LVOT peak vel: 113 cm/s
MV pk A vel: 77.1 m/s
MV pk E vel: 44.6 m/s
PW: 15 mm — AB (ref 0.6–1.1)
RV LATERAL S' VELOCITY: 13.7 cm/s
TAPSE: 16.9 mm
TDI e' medial: 8.16
Weight: 2197.55 oz

## 2016-06-25 LAB — GLUCOSE, CAPILLARY
GLUCOSE-CAPILLARY: 148 mg/dL — AB (ref 65–99)
GLUCOSE-CAPILLARY: 142 mg/dL — AB (ref 65–99)
GLUCOSE-CAPILLARY: 145 mg/dL — AB (ref 65–99)
GLUCOSE-CAPILLARY: 134 mg/dL — AB (ref 65–99)
GLUCOSE-CAPILLARY: 148 mg/dL — AB (ref 65–99)
GLUCOSE-CAPILLARY: 295 mg/dL — AB (ref 65–99)
GLUCOSE-CAPILLARY: 251 mg/dL — AB (ref 65–99)
Glucose-Capillary: 120 mg/dL — ABNORMAL HIGH (ref 65–99)
Glucose-Capillary: 148 mg/dL — ABNORMAL HIGH (ref 65–99)
Glucose-Capillary: 166 mg/dL — ABNORMAL HIGH (ref 65–99)
Glucose-Capillary: 214 mg/dL — ABNORMAL HIGH (ref 65–99)

## 2016-06-25 LAB — HEMOGLOBIN A1C
Hgb A1c MFr Bld: 10.4 % — ABNORMAL HIGH (ref 4.8–5.6)
Mean Plasma Glucose: 252 mg/dL

## 2016-06-25 LAB — RPR: RPR: NONREACTIVE

## 2016-06-25 MED ORDER — INSULIN ASPART 100 UNIT/ML ~~LOC~~ SOLN
0.0000 [IU] | Freq: Three times a day (TID) | SUBCUTANEOUS | Status: DC
  Administered 2016-06-25: 8 [IU] via SUBCUTANEOUS
  Administered 2016-06-26: 15 [IU] via SUBCUTANEOUS

## 2016-06-25 MED ORDER — INSULIN DETEMIR 100 UNIT/ML ~~LOC~~ SOLN
10.0000 [IU] | SUBCUTANEOUS | Status: DC
  Administered 2016-06-25: 10 [IU] via SUBCUTANEOUS
  Filled 2016-06-25: qty 0.1

## 2016-06-25 MED ORDER — LABETALOL HCL 5 MG/ML IV SOLN
10.0000 mg | INTRAVENOUS | Status: DC | PRN
  Administered 2016-06-25: 10 mg via INTRAVENOUS
  Filled 2016-06-25: qty 4

## 2016-06-25 MED ORDER — ISOSORB DINITRATE-HYDRALAZINE 20-37.5 MG PO TABS
1.0000 | ORAL_TABLET | Freq: Three times a day (TID) | ORAL | Status: DC
  Administered 2016-06-25 – 2016-06-28 (×9): 1 via ORAL
  Filled 2016-06-25 (×12): qty 1

## 2016-06-25 MED ORDER — SODIUM CHLORIDE 0.9 % IV SOLN
INTRAVENOUS | Status: DC
  Administered 2016-06-25: 50 mL/h via INTRAVENOUS

## 2016-06-25 MED ORDER — INSULIN ASPART 100 UNIT/ML ~~LOC~~ SOLN
0.0000 [IU] | Freq: Every day | SUBCUTANEOUS | Status: DC
Start: 2016-06-25 — End: 2016-06-26
  Administered 2016-06-25: 2 [IU] via SUBCUTANEOUS

## 2016-06-25 MED ORDER — INSULIN ASPART 100 UNIT/ML ~~LOC~~ SOLN
3.0000 [IU] | Freq: Three times a day (TID) | SUBCUTANEOUS | Status: DC
  Administered 2016-06-25 – 2016-06-28 (×7): 3 [IU] via SUBCUTANEOUS

## 2016-06-25 MED ORDER — METOPROLOL TARTRATE 12.5 MG HALF TABLET
12.5000 mg | ORAL_TABLET | Freq: Two times a day (BID) | ORAL | Status: DC
  Administered 2016-06-25 – 2016-06-27 (×4): 12.5 mg via ORAL
  Filled 2016-06-25 (×5): qty 1

## 2016-06-25 MED ORDER — SODIUM CHLORIDE 0.45 % IV SOLN
INTRAVENOUS | Status: DC
  Administered 2016-06-25: 50 mL/h via INTRAVENOUS
  Administered 2016-06-26 – 2016-06-28 (×3): via INTRAVENOUS

## 2016-06-25 MED ORDER — AMLODIPINE BESYLATE 10 MG PO TABS
10.0000 mg | ORAL_TABLET | Freq: Every day | ORAL | Status: DC
  Filled 2016-06-25: qty 1

## 2016-06-25 MED ORDER — INSULIN GLARGINE 100 UNIT/ML ~~LOC~~ SOLN
10.0000 [IU] | Freq: Two times a day (BID) | SUBCUTANEOUS | Status: DC
  Administered 2016-06-25 – 2016-06-28 (×6): 10 [IU] via SUBCUTANEOUS
  Filled 2016-06-25 (×7): qty 0.1

## 2016-06-25 MED ORDER — INSULIN ASPART 100 UNIT/ML ~~LOC~~ SOLN
0.0000 [IU] | Freq: Three times a day (TID) | SUBCUTANEOUS | Status: DC
  Administered 2016-06-25: 8 [IU] via SUBCUTANEOUS

## 2016-06-25 NOTE — Progress Notes (Signed)
Inpatient Diabetes Program Recommendations  AACE/ADA: New Consensus Statement on Inpatient Glycemic Control (2015)  Target Ranges:  Prepandial:   less than 140 mg/dL      Peak postprandial:   less than 180 mg/dL (1-2 hours)      Critically ill patients:  140 - 180 mg/dL   Results for Mike [name], Mike [name] (MRN [id number]) as of 06/25/2016 09:21  Ref. Range 06/25/2016 00:56 06/25/2016 02:01 06/25/2016 03:03 06/25/2016 04:03 06/25/2016 05:00 06/25/2016 05:47 06/25/2016 06:19 06/25/2016 07:37  Glucose-Capillary Latest Ref Range: 65 - 99 mg/dL 409120 (H) 811148 (H) 914142 (H) 145 (H) 148 (H) 134 (H) 166 (H) 148 (H)  Results for Mike [name], Mike [name] (MRN [id number]) as of 06/25/2016 09:21  Ref. Range 06/24/2016 12:50  Glucose Latest Ref Range: 65 - 99 mg/dL 0,8651,058 (HH)   Review of Glycemic Control  Diabetes history: DM Outpatient Diabetes medications: 70/30 20 units daily Current orders for Inpatient glycemic control: Levemir 10 units Q24H, Novolog 0-15 units TID with meals  Inpatient Diabetes Program Recommendations:  IV Fluids: Please consider discontinuing IV fluids with dextrose. Insulin - Basal: Over the past 8 hours on the insulin drip, patient has received a total of 26.2 units of insulin. Noted Levemir 10 units ordered and patient received Levemir 10 units at 7:43 am today.  Anticipate patient will require more basal insulin than ordered. Please consider increasing Levemir to 9 units BID (starting at bedtime today; based on 62 kg x 0.3 units). Correction (SSI): Please consider ordering Novolog 0-5 units QHS for bedtime correction. Insulin - Meal Coverage: If post prandial glucose is consistently elevated, please consider ordering Novolog 3 units TID with meals for meal coverage if patient eats at least 50% of meals. HgbA1C: A1C in process.  NOTE: Noted consult for Diabetes Coordinator and chart reviewed and recommendations made. Initial glucose 1058 mg/dl on 7/8/466/9/18 and per H&P patient from homeless shelter and has  not taken insulin in at least 1 week. Diabetes Coordinator is not on campus today but will plan to follow up with patient on 06/26/16.  Thanks, Orlando PennerMarie Jorgia Manthei, RN, MSN, CDE Diabetes Coordinator Inpatient Diabetes Program [phone] (Team Pager from 8am to 5pm)

## 2016-06-25 NOTE — Progress Notes (Signed)
[place] TEAM 1 - Stepdown/ICU TEAM  Nathaneil Plummer  ZOX:[id number] DOB: [birth year] DOA: 06/24/2016 PCP: [name], [name], NP    Brief Narrative:  56 y.o. male with history of DM2, seizures, HTN, HLD, chronic low back pain, and anxiety who presented to the ED w/ a 3 day history of generalized weakness dizziness nausea and vomiting. Evaluation in the ED revealed diabetic ketoacidosis with an anion gap of 24, acute kidney injury, mild encephalopathy and hypertensive urgency  Subjective: Resting comfortably in bed.  Denies chest pain shortness breath fevers chills nausea or vomiting.  States he feels much better now.  Assessment & Plan:  DKA in uncontrolled DM2 DKA resolved but CBG still quite variable - adjust insulin dosing and follow CBC closely - felt to be due to noncompliance with insulin related to social challenges  Uncontrolled HTN Due to noncompliance with medications and outpatient setting - titrate medications - use only medications readily available at low cost as an outpatient - follow blood pressure trend  Acute kidney injury Most consistent with prerenal azotemia in the setting of DKA associated dehydration - slowly improving with volume resuscitation  Recent Labs Lab 06/24/16 1843 06/24/16 2157 06/25/16 0159 06/25/16 0345 06/25/16 0649  CREATININE 1.90* 1.77* 1.69* 1.70* 1.47*    Acute encephalopathy CT head without acute findings - mental status appears to be improving  Mildly elevated troponin Likely simple demand ischemia in setting of tachycardia related to DKA - check for wall motion abnormality with TTE  DVT prophylaxis: SQ heparin  Code Status: FULL CODE Family Communication: no family present at time of exam  Disposition Plan: SDU until BP and CBG improved   Consultants:  None  Procedures: None  Antimicrobials:  None  Objective: Blood pressure (!) 176/84, pulse 100, temperature 98.2 F (36.8 C), temperature source Oral, resp. rate 16,  height 6\' 4"  (1.93 m), weight 62.3 kg (137 lb 5.6 oz), SpO2 98 %.  Intake/Output Summary (Last 24 hours) at 06/25/16 1526 Last data filed at 06/25/16 0900  Gross per 24 hour  Intake          2641.28 ml  Output              345 ml  Net          2296.28 ml   Filed Weights   06/24/16 1800  Weight: 62.3 kg (137 lb 5.6 oz)    Examination: General: No acute respiratory distress Lungs: Clear to auscultation bilaterally without wheezes or crackles Cardiovascular: Tachycardic but regular - ?soft systolic murmur Abdomen: Nontender, nondistended, soft, bowel sounds positive, no rebound, no ascites, no appreciable mass Extremities: No significant cyanosis, clubbing, or edema bilateral lower extremities  CBC:  Recent Labs Lab 06/24/16 1250  WBC 11.2*  NEUTROABS 9.2*  HGB 13.7  HCT 43.4  MCV 80.5  PLT 295   Basic Metabolic Panel:  Recent Labs Lab 06/24/16 1843 06/24/16 2157 06/25/16 0159 06/25/16 0345 06/25/16 0649  NA 142 146* 152* 152* 151*  K 3.5 3.6 3.8 3.6 3.6  CL 106 112* 115* 114* 116*  CO2 22 24 26 27 23   GLUCOSE 507* 267* 139* 142* 158*  BUN 54* 54* 51* 51* 47*  CREATININE 1.90* 1.77* 1.69* 1.70* 1.47*  CALCIUM 9.5 9.9 10.0 10.1 9.9  MG 3.4*  --   --   --   --   PHOS 2.8  --   --   --   --    GFR: Estimated Creatinine Clearance: 50.6 mL/min (A) (  by C-G formula based on SCr of 1.47 mg/dL (H)).  Liver Function Tests:  Recent Labs Lab 06/24/16 1250  AST 15  ALT 13*  ALKPHOS 82  BILITOT 1.8*  PROT 8.4*  ALBUMIN 4.3   Coagulation Profile:  Recent Labs Lab 06/24/16 1400  INR 1.04    Cardiac Enzymes:  Recent Labs Lab 06/24/16 1843 06/24/16 2157  TROPONINI 0.21* 0.25*    HbA1C: Hgb A1c MFr Bld  Date/Time Value Ref Range Status  06/24/2016 06:43 PM 10.4 (H) 4.8 - 5.6 % Final    Comment:    (NOTE)         Pre-diabetes: 5.7 - 6.4         Diabetes: >6.4         Glycemic control for adults with diabetes: <7.0   12/23/2014 08:11 AM 11.2 (H)  4.8 - 5.6 % Final    Comment:    (NOTE)         Pre-diabetes: 5.7 - 6.4         Diabetes: >6.4         Glycemic control for adults with diabetes: <7.0     CBG:  Recent Labs Lab 06/25/16 0500 06/25/16 0547 06/25/16 0619 06/25/16 0737 06/25/16 1234  GLUCAP 148* 134* 166* 148* 295*    Recent Results (from the past 240 hour(s))  MRSA PCR Screening     Status: None   Collection Time: 06/24/16  6:18 PM  Result Value Ref Range Status   MRSA by PCR NEGATIVE NEGATIVE Final    Comment:        The GeneXpert MRSA Assay (FDA approved for NASAL specimens only), is one component of a comprehensive MRSA colonization surveillance program. It is not intended to diagnose MRSA infection nor to guide or monitor treatment for MRSA infections.      Scheduled Meds: . cloNIDine  0.2 mg Transdermal Weekly  . heparin  5,000 Units Subcutaneous Q8H  . insulin aspart  0-15 Units Subcutaneous TID WC  . insulin detemir  10 Units Subcutaneous Q24H     LOS: 1 day   Lonia Blood, MD [facility] Office  [phone] Pager - Text Page per Loretha Stapler as per below:  On-Call/Text Page:      [email or web address]      password [email or web address]  If 7PM-7AM, please contact night-coverage [email or web address] Password [email or web address] 06/25/2016, 3:26 PM

## 2016-06-25 NOTE — Progress Notes (Signed)
  Echocardiogram 2D Echocardiogram has been performed.  Mike [name], [name] 06/25/2016, 1:30 PM

## 2016-06-26 LAB — COMPREHENSIVE METABOLIC PANEL
ALBUMIN: 3.6 g/dL (ref 3.5–5.0)
ALK PHOS: 68 U/L (ref 38–126)
ALT: 10 U/L — AB (ref 17–63)
AST: 16 U/L (ref 15–41)
Anion gap: 11 (ref 5–15)
BILIRUBIN TOTAL: 2 mg/dL — AB (ref 0.3–1.2)
BUN: 37 mg/dL — ABNORMAL HIGH (ref 6–20)
CALCIUM: 9.4 mg/dL (ref 8.9–10.3)
CO2: 24 mmol/L (ref 22–32)
CREATININE: 1.26 mg/dL — AB (ref 0.61–1.24)
Chloride: 110 mmol/L (ref 101–111)
GFR calc Af Amer: >60 mL/min (ref >60)
GFR calc non Af Amer: >60 mL/min (ref >60)
GLUCOSE: 309 mg/dL — AB (ref 65–99)
Potassium: 4.1 mmol/L (ref 3.5–5.1)
Sodium: 145 mmol/L (ref 135–145)
TOTAL PROTEIN: 6.9 g/dL (ref 6.5–8.1)

## 2016-06-26 LAB — RETICULOCYTES
RBC.: 5.27 MIL/uL (ref 4.22–5.81)
RETIC COUNT ABSOLUTE: 26.4 10*3/uL (ref 19.0–186.0)
RETIC CT PCT: 0.5 % (ref 0.4–3.1)

## 2016-06-26 LAB — CBC
HCT: 41.3 % (ref 39.0–52.0)
HEMOGLOBIN: 13.2 g/dL (ref 13.0–17.0)
MCH: 25 pg — AB (ref 26.0–34.0)
MCHC: 32 g/dL (ref 30.0–36.0)
MCV: 78.4 fL (ref 78.0–100.0)
PLATELETS: 265 10*3/uL (ref 150–400)
RBC: 5.27 MIL/uL (ref 4.22–5.81)
RDW: 12.8 % (ref 11.5–15.5)
WBC: 11.1 10*3/uL — ABNORMAL HIGH (ref 4.0–10.5)

## 2016-06-26 LAB — PHOSPHORUS: PHOSPHORUS: 2.7 mg/dL (ref 2.5–4.6)

## 2016-06-26 LAB — GLUCOSE, CAPILLARY
GLUCOSE-CAPILLARY: 410 mg/dL — AB (ref 65–99)
Glucose-Capillary: 359 mg/dL — ABNORMAL HIGH (ref 65–99)
Glucose-Capillary: 141 mg/dL — ABNORMAL HIGH (ref 65–99)
Glucose-Capillary: 213 mg/dL — ABNORMAL HIGH (ref 65–99)

## 2016-06-26 LAB — VITAMIN B12: Vitamin B-12: 877 pg/mL (ref 180–914)

## 2016-06-26 LAB — FOLATE: Folate: 12.6 ng/mL (ref >5.9)

## 2016-06-26 LAB — IRON AND TIBC
Iron: 178 ug/dL (ref 45–182)
Saturation Ratios: 89 % — ABNORMAL HIGH (ref 17.9–39.5)
TIBC: 200 ug/dL — ABNORMAL LOW (ref 250–450)
UIBC: 22 ug/dL

## 2016-06-26 LAB — MAGNESIUM: MAGNESIUM: 2.5 mg/dL — AB (ref 1.7–2.4)

## 2016-06-26 LAB — FERRITIN: Ferritin: 350 ng/mL — ABNORMAL HIGH (ref 24–336)

## 2016-06-26 MED ORDER — INSULIN ASPART 100 UNIT/ML ~~LOC~~ SOLN: 18.0000 [IU] | Freq: Once | SUBCUTANEOUS | Status: DC

## 2016-06-26 MED ORDER — INSULIN ASPART 100 UNIT/ML ~~LOC~~ SOLN
0.0000 [IU] | Freq: Every day | SUBCUTANEOUS | Status: DC
  Administered 2016-06-26: 2 [IU] via SUBCUTANEOUS

## 2016-06-26 MED ORDER — INSULIN ASPART 100 UNIT/ML ~~LOC~~ SOLN
0.0000 [IU] | Freq: Three times a day (TID) | SUBCUTANEOUS | Status: DC
  Administered 2016-06-26: 20 [IU] via SUBCUTANEOUS
  Administered 2016-06-26: 3 [IU] via SUBCUTANEOUS
  Administered 2016-06-27: 7 [IU] via SUBCUTANEOUS
  Administered 2016-06-27 – 2016-06-28 (×3): 3 [IU] via SUBCUTANEOUS

## 2016-06-26 MED ORDER — INSULIN ASPART 100 UNIT/ML ~~LOC~~ SOLN
3.0000 [IU] | Freq: Once | SUBCUTANEOUS | Status: AC
Start: 2016-06-26 — End: 2016-06-26
  Administered 2016-06-26: 3 [IU] via SUBCUTANEOUS

## 2016-06-26 NOTE — Progress Notes (Signed)
CRITICAL VALUE ALERT  Critical Value: CBG-410  Date & Time Notied: 06/26/16 @ 08:55am  Provider Notified: Dr. Sharon SellerMcClung  Orders Received/Actions taken: MD placed orders

## 2016-06-26 NOTE — Progress Notes (Addendum)
[place] TEAM 1 - Stepdown/ICU TEAM  Mike [name]  NWG:[id number] DOB: April 07, 1960 DOA: 06/24/2016 PCP: Mike SharpsPlacey, [name], NP    Brief Narrative:  56 y.o. male with history of DM2, seizures, HTN, HLD, chronic low back pain, and anxiety who presented to the ED w/ a 3 day history of generalized weakness dizziness nausea and vomiting. Evaluation in the ED revealed diabetic ketoacidosis with an anion gap of 24, acute kidney injury, mild encephalopathy and hypertensive urgency  Subjective: The patient is resting comfortably in bed.  His affect is flat and at times he simply looks at me when I ask him questions and does not answer.  He answers other questions.  He denies chest pain shortness of breath or abdominal pain.  Assessment & Plan:  DKA in uncontrolled DM2 CBG remains quite variable with a range thus far today of 141-410 - as the overall trend is that of improvement follow without change for now - felt to be due to noncompliance with insulin related to social challenges  Uncontrolled HTN Due to noncompliance with medications and outpatient setting - use only medications readily available at low cost as an outpatient - [name] pressure much improved - follow without change today  Acute kidney injury Most consistent with prerenal azotemia in the setting of DKA associated dehydration - continues to improve - follow to normalization  Recent Labs Lab 06/24/16 2157 06/25/16 0159 06/25/16 0345 06/25/16 0649 06/26/16 0332  CREATININE 1.77* 1.69* 1.70* 1.47* 1.26*    Acute encephalopathy CT head without acute findings - mental status back to baseline  Mildly elevated troponin Likely simple demand ischemia in setting of tachycardia related to DKA - no focal WMA on TTE - recheck trop in AM to assure has trended down - denies CP  ?HOCM TTE noted near mid and apical cavity obliteration in systole with severe LVH - recommendation is for MRI to r/o HOCM - in that pt needs MRI Card  Morphology Wo/W contrast I will wait until his crt has improved further before dosing ordering the test (perhaps 6/12)  DVT prophylaxis: SQ heparin  Code Status: FULL CODE Family Communication: no family present at time of exam  Disposition Plan: not yet ready for d/c due to lability in CBG and need to obtain cardiac MRI  Consultants:  None  Procedures: TTE - 6/10  Antimicrobials:  None  Objective: [name] pressure (!) 155/97, pulse 76, temperature 98.6 F (37 C), temperature source Oral, resp. rate 15, height 6\' 4"  (1.93 m), weight 62.3 kg (137 lb 5.6 oz), SpO2 98 %.  Intake/Output Summary (Last 24 hours) at 06/26/16 1622 Last data filed at 06/26/16 1619  Gross per 24 hour  Intake              550 ml  Output             1700 ml  Net            -1150 ml   Filed Weights   06/24/16 1800  Weight: 62.3 kg (137 lb 5.6 oz)    Examination: General: No acute respiratory distress - alert w/ flat affect  Lungs: CTA B - no wheezing  Cardiovascular: RRR w/o M  Abdomen: Nontender, nondistended, soft, bowel sounds positive, no rebound Extremities: No significant edema bilateral lower extremities  CBC:  Recent Labs Lab 06/24/16 1250 06/26/16 0332  WBC 11.2* 11.1*  NEUTROABS 9.2*  --   HGB 13.7 13.2  HCT 43.4 41.3  MCV 80.5 78.4  PLT  295 265   Basic Metabolic Panel:  Recent Labs Lab 06/24/16 1843 06/24/16 2157 06/25/16 0159 06/25/16 0345 06/25/16 0649 06/26/16 0332  NA 142 146* 152* 152* 151* 145  K 3.5 3.6 3.8 3.6 3.6 4.1  CL 106 112* 115* 114* 116* 110  CO2 22 24 26 27 23 24   GLUCOSE 507* 267* 139* 142* 158* 309*  BUN 54* 54* 51* 51* 47* 37*  CREATININE 1.90* 1.77* 1.69* 1.70* 1.47* 1.26*  CALCIUM 9.5 9.9 10.0 10.1 9.9 9.4  MG 3.4*  --   --   --   --  2.5*  PHOS 2.8  --   --   --   --  2.7   GFR: Estimated Creatinine Clearance: 59.1 mL/min (A) (by C-G formula based on SCr of 1.26 mg/dL (H)).  Liver Function Tests:  Recent Labs Lab 06/24/16 1250  06/26/16 0332  AST 15 16  ALT 13* 10*  ALKPHOS 82 68  BILITOT 1.8* 2.0*  PROT 8.4* 6.9  ALBUMIN 4.3 3.6   Coagulation Profile:  Recent Labs Lab 06/24/16 1400  INR 1.04    Cardiac Enzymes:  Recent Labs Lab 06/24/16 1843 06/24/16 2157  TROPONINI 0.21* 0.25*    HbA1C: Hgb A1c MFr Bld  Date/Time Value Ref Range Status  06/24/2016 06:43 PM 10.4 (H) 4.8 - 5.6 % Final    Comment:    (NOTE)         Pre-diabetes: 5.7 - 6.4         Diabetes: >6.4         Glycemic control for adults with diabetes: <7.0   12/23/2014 08:11 AM 11.2 (H) 4.8 - 5.6 % Final    Comment:    (NOTE)         Pre-diabetes: 5.7 - 6.4         Diabetes: >6.4         Glycemic control for adults with diabetes: <7.0     CBG:  Recent Labs Lab 06/25/16 1553 06/25/16 2110 06/26/16 0756 06/26/16 1229 06/26/16 1617  GLUCAP 251* 214* 410* 359* 141*    Recent Results (from the past 240 hour(s))  MRSA PCR Screening     Status: None   Collection Time: 06/24/16  6:18 PM  Result Value Ref Range Status   MRSA by PCR NEGATIVE NEGATIVE Final    Comment:        The GeneXpert MRSA Assay (FDA approved for NASAL specimens only), is one component of a comprehensive MRSA colonization surveillance program. It is not intended to diagnose MRSA infection nor to guide or monitor treatment for MRSA infections.      Scheduled Meds: . heparin  5,000 Units Subcutaneous Q8H  . insulin aspart  0-20 Units Subcutaneous TID WC  . insulin aspart  0-5 Units Subcutaneous QHS  . insulin aspart  3 Units Subcutaneous TID WC  . insulin glargine  10 Units Subcutaneous BID  . isosorbide-hydrALAZINE  1 tablet Oral TID  . metoprolol tartrate  12.5 mg Oral BID     LOS: 2 days   Mike Blood, MD [facility] Office  [phone] Pager - Text Page per Amion as per below:  On-Call/Text Page:      [name]      password [email or web address]  If 7PM-7AM, please contact night-coverage [email or web address] Password  [facility] 06/26/2016, 4:22 PM

## 2016-06-26 NOTE — Progress Notes (Signed)
Inpatient Diabetes Program Recommendations  AACE/ADA: New Consensus Statement on Inpatient Glycemic Control (2015)  Target Ranges:  Prepandial:   less than 140 mg/dL      Peak postprandial:   less than 180 mg/dL (1-2 hours)      Critically ill patients:  140 - 180 mg/dL   Lab Results  Component Value Date   GLUCAP 141 (H) 06/26/2016   HGBA1C 10.4 (H) 06/24/2016    Review of Glycemic Control  Pt will very flat affect. Could not carry on conversation with pt regarding his diabetes control. Stared at this Coordinator when asked a question. Pt stated, "Where is my clothes and why am I here?"  DKA resolved although CBGs are very labile.  Noncompliance with insulin likely d/t social challenges.  Continue to monitor.  Thank you. Ailene Ardshonda Nelma Phagan, RD, LDN, CDE Inpatient Diabetes Coordinator [phone]

## 2016-06-26 NOTE — Care Management Note (Addendum)
Case Management Note  Patient Details  Name: Mike [name] Date of Birth: 1960/10/19   Subjective/Objective:    Homeless , shelter- presents with DKA, [name]  She helps patient to get insulin and other medications.  Patient will need to see her as walk in when discharged.  Patient missed apt on 4/11 previously with her. Patient states he will need a bus pass, but [facility] , [name]  [facility],  And his glucometer, she states she will wait for patient to get there.   [facility]              Action/Plan: NCM will follow for dc needs.   Expected Discharge Date:                  Expected Discharge Plan:  Homeless Shelter  In-House Referral:  Clinical Social Work  Discharge planning Services  CM Consult  Post Acute Care Choice:    Choice offered to:     DME Arranged:    DME Agency:     HH Arranged:    HH Agency:     Status of Service:  In process, will continue to follow  If discussed at Long Length of Stay Meetings, dates discussed:    Additional Comments:  Mike Havenaylor, Mike Carico Clinton, RN 06/26/2016, 9:26 AM

## 2016-06-27 ENCOUNTER — Inpatient Hospital Stay (HOSPITAL_COMMUNITY): Payer: Self-pay

## 2016-06-27 ENCOUNTER — Inpatient Hospital Stay: Payer: Self-pay

## 2016-06-27 DIAGNOSIS — Z794 Long term (current) use of insulin: Secondary | ICD-10-CM

## 2016-06-27 DIAGNOSIS — E1165 Type 2 diabetes mellitus with hyperglycemia: Secondary | ICD-10-CM

## 2016-06-27 DIAGNOSIS — E1121 Type 2 diabetes mellitus with diabetic nephropathy: Secondary | ICD-10-CM

## 2016-06-27 LAB — GLUCOSE, CAPILLARY
GLUCOSE-CAPILLARY: 89 mg/dL (ref 65–99)
Glucose-Capillary: 245 mg/dL — ABNORMAL HIGH (ref 65–99)
Glucose-Capillary: 141 mg/dL — ABNORMAL HIGH (ref 65–99)
Glucose-Capillary: 124 mg/dL — ABNORMAL HIGH (ref 65–99)

## 2016-06-27 LAB — RENAL FUNCTION PANEL
ALBUMIN: 3.2 g/dL — AB (ref 3.5–5.0)
Anion gap: 8 (ref 5–15)
BUN: 28 mg/dL — AB (ref 6–20)
CO2: 26 mmol/L (ref 22–32)
Calcium: 9.1 mg/dL (ref 8.9–10.3)
Chloride: 105 mmol/L (ref 101–111)
Creatinine, Ser: 0.94 mg/dL (ref 0.61–1.24)
GFR calc Af Amer: >60 mL/min (ref >60)
Glucose, Bld: 207 mg/dL — ABNORMAL HIGH (ref 65–99)
PHOSPHORUS: 2.7 mg/dL (ref 2.5–4.6)
POTASSIUM: 3.7 mmol/L (ref 3.5–5.1)
Sodium: 139 mmol/L (ref 135–145)

## 2016-06-27 LAB — FOLATE RBC
FOLATE, RBC: 1144 ng/mL (ref >498)
Folate, Hemolysate: 512.5 ng/mL
Hematocrit: 44.8 % (ref 37.5–51.0)

## 2016-06-27 LAB — HIV 1/2 AB DIFFERENTIATION
HIV 1 Ab: NEGATIVE
HIV 2 Ab: NEGATIVE
Note: NEGATIVE

## 2016-06-27 LAB — TROPONIN I: Troponin I: 0.05 ng/mL (ref <0.03)

## 2016-06-27 LAB — RNA QUALITATIVE

## 2016-06-27 LAB — HIV ANTIBODY (ROUTINE TESTING W REFLEX): HIV SCREEN 4TH GENERATION: REACTIVE — AB

## 2016-06-27 MED ORDER — GADOBENATE DIMEGLUMINE 529 MG/ML IV SOLN
20.0000 mL | Freq: Once | INTRAVENOUS | Status: AC | PRN
  Administered 2016-06-27: 20 mL via INTRAVENOUS

## 2016-06-27 MED ORDER — METOPROLOL TARTRATE 25 MG PO TABS
25.0000 mg | ORAL_TABLET | Freq: Two times a day (BID) | ORAL | Status: DC
  Administered 2016-06-27 – 2016-06-28 (×2): 25 mg via ORAL
  Filled 2016-06-27 (×2): qty 1

## 2016-06-27 NOTE — Clinical Social Work Note (Signed)
CSW met with patient who stated that he was very tired. CSW inquired about living situation and patient reported that he has been living at [facility] for "a while." He is agreeable to receiving resources. CSW provided shelter list, Tourist information centre manager, and food pantry and free meals booklets. Patient stated that he was too tired to look through them but would when he was more awake. CSW will try to see patient again to inquire about other needs. Per RNCM note, patient requesting bus pass when he discharges.  Dayton Scrape, Cross

## 2016-06-27 NOTE — Progress Notes (Signed)
PROGRESS NOTE    Mike [name]  ZOX:[id number] DOB: Apr 05, 1960 DOA: 06/24/2016 PCP: [name], NP   Brief Narrative:  56 y.o.BM PMHx DM Type 2, seizures, Anxiety, Depression HTN, HLD, chronic low back pain,   Who presented to the ED w/ a 3 day history of generalized weakness dizziness nausea and vomiting. Evaluation in the ED revealed diabetic ketoacidosis with an anion gap of 24, acute kidney injury, mild encephalopathy and hypertensive urgency   Subjective: 6/12 A/O 1 (does not aware, why, when) however RN states that patient A/O 4 with her, cooperative. Negative CP, negative SOB   Assessment & Plan:   Principal Problem:   DKA (diabetic ketoacidoses) (HCC) Active Problems:   Hypertensive urgency   Tobacco abuse   Protein-calorie malnutrition, severe   Acute encephalopathy   Elevated troponin   Acute kidney injury (HCC)   Nausea and vomiting   Tachycardia   DKA / DM Type 2 Uncontrolled Renal complications -6/9 Hemoglobin A1c= 10.4 CBG remains quite variable with a range thus far today of 141-410 - as the overall trend is that of improvement follow without change for now - felt to be due to noncompliance with insulin related to social challenges -Lantus 10 units BID -NovoLog 3 units QAC -Resistant SSI  Uncontrolled HTN -Due to noncompliance with medications and outpatient setting  -Increase metoprolol 25 mg BID -Hold off on ACE inhibitor/ARB secondary to acute kidney injury - use only medications readily available at low cost as an outpatient   Acute kidney injury Most consistent with prerenal azotemia in the setting of DKA associated dehydration - continues to improve - follow to  Lab Results  Component Value Date   CREATININE 0.94 06/27/2016   CREATININE 1.26 (H) 06/26/2016   CREATININE 1.47 (H) 06/25/2016    Acute encephalopathy -CT head without acute findings  -Patient A/O 1 today baseline?  Mildly elevated troponin Likely simple demand  ischemia in setting of tachycardia related to DKA - no focal WMA on TTE - recheck trop in AM to assure has trended down - denies CP  ?HOCM -TTE noted near mid and apical cavity obliteration in systole with severe LVH - recommendation is for MRI to r/o HOCM  -Cardiac MRI pending    DVT prophylaxis: SQ heparin  Code Status: FULL CODE Family Communication: Full Disposition Plan: not yet ready for d/c due to lability in CBG and need to obtain cardiac MRI   Consultants:  None  Procedures/Significant Events:  6/9 CT head WO contrast:Global atrophy and chronic ischemic changes. Negative CVA 6/10 Echocardiogram:Left ventricle: Near mid and apical cavity obliteration in   systole with severe LVH consider MRI to r/o HOCM. Severe LVH.  -LVEF 65% to 70%. -(grade 1 diastolic dysfunction).   VENTILATOR SETTINGS: None   Cultures None  Antimicrobials: Anti-infectives    None       Devices None   LINES / TUBES:      Continuous Infusions: . sodium chloride 50 mL/hr at 06/27/16 0815     Objective: Vitals:   06/26/16 2135 06/26/16 2339 06/27/16 0319 06/27/16 0709  BP: (!) 172/90 (!) 170/84 (!) 177/86 140/75  Pulse:  84 80 78  Resp:  12 12 (!) 21  Temp:  98.9 F (37.2 C) 98.5 F (36.9 C)   TempSrc:  Oral Oral   SpO2:  98% 95%   Weight:      Height:        Intake/Output Summary (Last 24 hours) at 06/27/16 8119 Last data  filed at 06/27/16 0709  Gross per 24 hour  Intake           1317.5 ml  Output             1100 ml  Net            217.5 ml   Filed Weights   06/24/16 1800  Weight: 137 lb 5.6 oz (62.3 kg)    Examination:  General:A/O 1 (does not aware, why, when) No acute respiratory distress, cachectic Eyes: negative scleral hemorrhage, negative anisocoria, negative icterus ENT: Negative Runny nose, negative gingival bleeding, Neck:  Negative scars, masses, torticollis, lymphadenopathy, JVD Lungs: Clear to auscultation bilaterally without wheezes or  crackles Cardiovascular: Regular rate and rhythm without murmur gallop or rub normal S1 and S2 Abdomen: negative abdominal pain, nondistended, positive soft, bowel sounds, no rebound, no ascites, no appreciable mass Extremities: No significant cyanosis, clubbing, or edema bilateral lower extremities Skin: Negative rashes, lesions, ulcers Psychiatric:  Unable to fully assess secondary to altered mental status  Central nervous system:  Cranial nerves II through XII intact, tongue/uvula midline, all extremities muscle strength 5/5, sensation intact throughout, negative dysarthria, negative expressive aphasia, negative receptive aphasia.  .     Data Reviewed: Care during the described time interval was provided by me .  I have reviewed this patient's available data, including medical history, events of note, physical examination, and all test results as part of my evaluation. I have personally reviewed and interpreted all radiology studies.  CBC:  Recent Labs Lab 06/24/16 1250 06/26/16 0332  WBC 11.2* 11.1*  NEUTROABS 9.2*  --   HGB 13.7 13.2  HCT 43.4 41.3  MCV 80.5 78.4  PLT 295 265   Basic Metabolic Panel:  Recent Labs Lab 06/24/16 1843  06/25/16 0159 06/25/16 0345 06/25/16 0649 06/26/16 0332 06/27/16 0307  NA 142  < > 152* 152* 151* 145 139  K 3.5  < > 3.8 3.6 3.6 4.1 3.7  CL 106  < > 115* 114* 116* 110 105  CO2 22  < > 26 27 23 24 26   GLUCOSE 507*  < > 139* 142* 158* 309* 207*  BUN 54*  < > 51* 51* 47* 37* 28*  CREATININE 1.90*  < > 1.69* 1.70* 1.47* 1.26* 0.94  CALCIUM 9.5  < > 10.0 10.1 9.9 9.4 9.1  MG 3.4*  --   --   --   --  2.5*  --   PHOS 2.8  --   --   --   --  2.7 2.7  < > = values in this interval not displayed. GFR: Estimated Creatinine Clearance: 79.2 mL/min (by C-G formula based on SCr of 0.94 mg/dL). Liver Function Tests:  Recent Labs Lab 06/24/16 1250 06/26/16 0332 06/27/16 0307  AST 15 16  --   ALT 13* 10*  --   ALKPHOS 82 68  --   BILITOT  1.8* 2.0*  --   PROT 8.4* 6.9  --   ALBUMIN 4.3 3.6 3.2*   No results for input(s): LIPASE, AMYLASE in the last 168 hours. No results for input(s): AMMONIA in the last 168 hours. Coagulation Profile:  Recent Labs Lab 06/24/16 1400  INR 1.04   Cardiac Enzymes:  Recent Labs Lab 06/24/16 1843 06/24/16 2157 06/27/16 0307  TROPONINI 0.21* 0.25* 0.05*   BNP (last 3 results) No results for input(s): PROBNP in the last 8760 hours. HbA1C:  Recent Labs  06/24/16 1843  HGBA1C 10.4*  CBG:  Recent Labs Lab 06/25/16 2110 06/26/16 0756 06/26/16 1229 06/26/16 1617 06/26/16 2110  GLUCAP 214* 410* 359* 141* 213*   Lipid Profile: No results for input(s): CHOL, HDL, LDLCALC, TRIG, CHOLHDL, LDLDIRECT in the last 72 hours. Thyroid Function Tests: No results for input(s): TSH, T4TOTAL, FREET4, T3FREE, THYROIDAB in the last 72 hours. Anemia Panel:  Recent Labs  06/24/16 1843 06/26/16 0332  VITAMINB12 1,115* 877  FOLATE  --  12.6  FERRITIN  --  350*  TIBC  --  200*  IRON  --  178  RETICCTPCT  --  0.5   Urine analysis:    Component Value Date/Time   COLORURINE STRAW (A) 06/24/2016 2000   APPEARANCEUR CLEAR 06/24/2016 2000   LABSPEC 1.025 06/24/2016 2000   PHURINE 5.0 06/24/2016 2000   GLUCOSEU >=500 (A) 06/24/2016 2000   HGBUR SMALL (A) 06/24/2016 2000   BILIRUBINUR NEGATIVE 06/24/2016 2000   KETONESUR 20 (A) 06/24/2016 2000   PROTEINUR 30 (A) 06/24/2016 2000   UROBILINOGEN 1.0 02/07/2011 1605   NITRITE NEGATIVE 06/24/2016 2000   LEUKOCYTESUR NEGATIVE 06/24/2016 2000   Sepsis Labs: @LABRCNTIP (procalcitonin:4,lacticidven:4)  ) Recent Results (from the past 240 hour(s))  MRSA PCR Screening     Status: None   Collection Time: 06/24/16  6:18 PM  Result Value Ref Range Status   MRSA by PCR NEGATIVE NEGATIVE Final    Comment:        The GeneXpert MRSA Assay (FDA approved for NASAL specimens only), is one component of a comprehensive MRSA  colonization surveillance program. It is not intended to diagnose MRSA infection nor to guide or monitor treatment for MRSA infections.          Radiology Studies: No results found.      Scheduled Meds: . heparin  5,000 Units Subcutaneous Q8H  . insulin aspart  0-20 Units Subcutaneous TID WC  . insulin aspart  0-5 Units Subcutaneous QHS  . insulin aspart  3 Units Subcutaneous TID WC  . insulin glargine  10 Units Subcutaneous BID  . isosorbide-hydrALAZINE  1 tablet Oral TID  . metoprolol tartrate  12.5 mg Oral BID   Continuous Infusions: . sodium chloride 50 mL/hr at 06/27/16 0815     LOS: 3 days    Time spent: 40 minutes    Dolton Shaker, Roselind MessierURTIS J, MD [facility] Pager [phone]   If 7PM-7AM, please contact night-coverage [email or web address] Password [email or web address] 06/27/2016, 8:28 AM

## 2016-06-28 DIAGNOSIS — I422 Other hypertrophic cardiomyopathy: Secondary | ICD-10-CM

## 2016-06-28 LAB — GLUCOSE, CAPILLARY
Glucose-Capillary: 134 mg/dL — ABNORMAL HIGH (ref 65–99)
Glucose-Capillary: 92 mg/dL (ref 65–99)

## 2016-06-28 MED ORDER — INSULIN NPH (HUMAN) (ISOPHANE) 100 UNIT/ML ~~LOC~~ SUSP: SUBCUTANEOUS | 0 refills | Status: DC

## 2016-06-28 MED ORDER — INSULIN ASPART 100 UNIT/ML ~~LOC~~ SOLN: 3.0000 [IU] | Freq: Three times a day (TID) | SUBCUTANEOUS | 11 refills | Status: DC

## 2016-06-28 MED ORDER — POLYETHYLENE GLYCOL 3350 17 G PO PACK: 17.0000 g | PACK | Freq: Two times a day (BID) | ORAL | Status: DC

## 2016-06-28 MED ORDER — SENNOSIDES-DOCUSATE SODIUM 8.6-50 MG PO TABS: 1.0000 | ORAL_TABLET | Freq: Two times a day (BID) | ORAL | Status: DC

## 2016-06-28 MED ORDER — METOPROLOL TARTRATE 25 MG PO TABS: 25.0000 mg | ORAL_TABLET | Freq: Two times a day (BID) | ORAL | 0 refills | Status: DC

## 2016-06-28 MED ORDER — "INSULIN SYRINGE 27G X 1/2"" 0.5 ML MISC": 1.0000 | Freq: Every day | 0 refills | Status: DC

## 2016-06-28 MED ORDER — HYDRALAZINE HCL 50 MG PO TABS: 50.0000 mg | ORAL_TABLET | Freq: Three times a day (TID) | ORAL | 0 refills | Status: DC

## 2016-06-28 MED ORDER — ISOSORBIDE DINITRATE 20 MG PO TABS: 30.0000 mg | ORAL_TABLET | Freq: Three times a day (TID) | ORAL | 0 refills | Status: DC

## 2016-06-28 NOTE — Progress Notes (Signed)
Patient and girlfriend given discharge instructions, patient belongings, and prescriptions. IV taken out, patient wheeled to main entrance for taxi pickup

## 2016-06-28 NOTE — Discharge Summary (Signed)
DISCHARGE SUMMARY  Mike [name]  MR#: [id number]  DOB:August 28, 1960  Date of Admission: 06/24/2016 Date of Discharge: 06/28/2016  Attending Physician:MCCLUNG,JEFFREY T  Patient's NWG:NFAOZH, Mike Abrahams, NP  Consults:  none  Disposition:  D/C   Follow-up Appts: Follow-up Information    [facility] Follow up.   Contact information: [address] [place] [address] [phone]        [name], NP Follow up.   Why:  [facility] , follow up as walk in Contact information: [address] [place] [address] [phone]           Tests Needing Follow-up: -monitoring of CBG control -monitoring of BP control   Discharge Diagnoses: DKA in uncontrolled DM2 Uncontrolled HTN Acute kidney injury Acute encephalopathy Mildly elevated troponin  Initial presentation: 56 y.o.malewith history of DM2, seizures, HTN, HLD, chronic low back pain, and anxiety who presented to the ED w/ a 3 day history of generalized weakness dizziness nausea and vomiting. Evaluation in the ED revealed diabetic ketoacidosis with an anion gap of 24, acute kidney injury, mild encephalopathy and hypertensive urgency  Hospital Course:  DKA in uncontrolled DM2 CBG well controlled at time of discharge - case management has arranged for outpatient medical follow-up and assistance with medication/CBG monitoring - patient to be seen immediately after his discharge from the hospital - DKA felt to be due to noncompliance with insulin related to social challenges  Uncontrolled HTN Due to noncompliance with medications and outpatient setting - blood pressure much improved at time of discharge with consistent use of medication  Acute kidney injury Most consistent with prerenal azotemia in the setting of DKA associated dehydration - creatinine has normalized at 0.94 prior to his discharge  Acute encephalopathy CT head without acute findings - mental status back to  baseline at time of discharge  Mildly elevated troponin Likely simple demand ischemia in setting of tachycardia related to DKA - no focal WMA on TTE - follow-up troponin confirmed near normalization/downward trend  ?HOCM TTE noted near mid and apical cavity obliteration in systole with severe LVH - recommendation was for MRI to r/o HOCM - MRI was accomplished and did not reveal evidence of HOCM  Difficult social situation  The patient was seen by the social worker prior to his discharge.  Though he did not interact with her much because he was "sleepy" she did offer assistance in identifying other shelters/housing opportunities in [place].  Additionally case management has diligently worked with the [facility] to assure the patient has access to medical care and medications after his discharge.  Allergies as of 06/28/2016      Reactions   Ibuprofen Nausea And Vomiting, Other (See Comments)   Reaction:  Bloating    Tylenol [acetaminophen] Nausea And Vomiting, Other (See Comments)   Reaction:  Bloating       Medication List    STOP taking these medications   amLODipine 10 MG tablet Commonly known as:  NORVASC   hydrOXYzine 25 MG tablet Commonly known as:  ATARAX/VISTARIL   insulin NPH-regular Human (70-30) 100 UNIT/ML injection Commonly known as:  NOVOLIN 70/30   Insulin Pen Needle 30G X 8 MM Misc Commonly known as:  NOVOFINE   lisinopril 20 MG tablet Commonly known as:  PRINIVIL,ZESTRIL     TAKE these medications   hydrALAZINE 50 MG tablet Commonly known as:  APRESOLINE Take 1 tablet (50 mg total) by mouth 3 (three) times daily.   insulin aspart 100  UNIT/ML injection Commonly known as:  novoLOG Inject 3 Units into the skin 3 (three) times daily with meals.   insulin NPH Human 100 UNIT/ML injection Commonly known as:  NOVOLIN N 12 units SQ BID   Insulin Syringe 27G X 1/2" 0.5 ML Misc 1 Syringe by Does not apply route 5 (five) times daily.   isosorbide dinitrate 20 MG  tablet Commonly known as:  ISORDIL Take 1.5 tablets (30 mg total) by mouth 3 (three) times daily.   metoprolol tartrate 25 MG tablet Commonly known as:  LOPRESSOR Take 1 tablet (25 mg total) by mouth 2 (two) times daily.       Day of Discharge BP (!) 174/86 (BP Location: Left Arm)   Pulse 68   Temp 98.6 F (37 C) (Oral)   Resp 14   Ht 6\' 4"  (1.93 m)   Wt 62.3 kg (137 lb 5.6 oz)   SpO2 98%   BMI 16.72 kg/m   Physical Exam: General: No acute respiratory distress Lungs: Clear to auscultation bilaterally without wheezes or crackles Cardiovascular: Regular rate and rhythm without murmur gallop or rub normal S1 and S2 Abdomen: Nontender, nondistended, soft, bowel sounds positive, no rebound, no ascites, no appreciable mass Extremities: No significant cyanosis, clubbing, or edema bilateral lower extremities  Basic Metabolic Panel:  Recent Labs Lab 06/24/16 1843  06/25/16 0159 06/25/16 0345 06/25/16 0649 06/26/16 0332 06/27/16 0307  NA 142  < > 152* 152* 151* 145 139  K 3.5  < > 3.8 3.6 3.6 4.1 3.7  CL 106  < > 115* 114* 116* 110 105  CO2 22  < > 26 27 23 24 26   GLUCOSE 507*  < > 139* 142* 158* 309* 207*  BUN 54*  < > 51* 51* 47* 37* 28*  CREATININE 1.90*  < > 1.69* 1.70* 1.47* 1.26* 0.94  CALCIUM 9.5  < > 10.0 10.1 9.9 9.4 9.1  MG 3.4*  --   --   --   --  2.5*  --   PHOS 2.8  --   --   --   --  2.7 2.7  < > = values in this interval not displayed.  Liver Function Tests:  Recent Labs Lab 06/24/16 1250 06/26/16 0332 06/27/16 0307  AST 15 16  --   ALT 13* 10*  --   ALKPHOS 82 68  --   BILITOT 1.8* 2.0*  --   PROT 8.4* 6.9  --   ALBUMIN 4.3 3.6 3.2*    Coags:  Recent Labs Lab 06/24/16 1400  INR 1.04    CBC:  Recent Labs Lab 06/24/16 1250 06/24/16 2157 06/26/16 0332  WBC 11.2*  --  11.1*  NEUTROABS 9.2*  --   --   HGB 13.7  --  13.2  HCT 43.4 44.8 41.3  MCV 80.5  --  78.4  PLT 295  --  265    Cardiac Enzymes:  Recent Labs Lab  06/24/16 1843 06/24/16 2157 06/27/16 0307  TROPONINI 0.21* 0.25* 0.05*     CBG:  Recent Labs Lab 06/27/16 1155 06/27/16 1810 06/27/16 2146 06/28/16 0824 06/28/16 1140  GLUCAP 141* 124* 89 134* 92    Recent Results (from the past 240 hour(s))  MRSA PCR Screening     Status: None   Collection Time: 06/24/16  6:18 PM  Result Value Ref Range Status   MRSA by PCR NEGATIVE NEGATIVE Final    Comment:        The GeneXpert  MRSA Assay (FDA approved for NASAL specimens only), is one component of a comprehensive MRSA colonization surveillance program. It is not intended to diagnose MRSA infection nor to guide or monitor treatment for MRSA infections.      Time spent in discharge (includes decision making & examination of pt): 35 minutes  06/28/2016, 2:35 PM   Lonia BloodJeffrey T. McClung, MD [facility] Office  [phone] Pager [phone]  On-Call/Text Page:      [email or web address]      password [email or web address]

## 2016-06-28 NOTE — Care Management Note (Signed)
Case Management Note  Patient Details  Name: Tobin ChadDesbourns Rawson MRN: [id number] Date of Birth: 1960-02-02  Subjective/Objective:    Homeless , shelter- presents with DKA, he [name]  She helps patient to get insulin and other medications.  Patient will need to see her as walk in when discharged.  Patient missed apt on 4/11 previously with her. Patient states he will need a bus pass, [facility] , [name] will be  [facility],  And his glucometer, she states she will wait for patient to get there.  [facility]  [facility]                            Action/Plan: NCM will cont to follow for dc needs.  Expected Discharge Date:  06/28/16               Expected Discharge Plan:  Homeless Shelter  In-House Referral:  Clinical Social Work  Discharge planning Services  CM Consult  Post Acute Care Choice:    Choice offered to:     DME Arranged:    DME Agency:     HH Arranged:    HH Agency:     Status of Service:  Completed, signed off  If discussed at MicrosoftLong Length of [employer] CompanyStay Meetings, dates discussed:    Additional Comments:  [name], Antonyo Hinderer Clinton, RN 06/28/2016, 3:33 PM

## 2016-06-28 NOTE — Clinical Social Work Note (Signed)
CSW met with patient. Significant other at bedside. Patient stated that he is not in need of any other resources. Per RNCM, patient needs a cab voucher in order to get to the [facility] today by 3:00 pm. Patient agreeable. CSW left cab voucher for nurse to call cab company when ready.  CSW signing off.  Mike [name], [place]

## 2016-06-28 NOTE — Discharge Instructions (Signed)
Blood Glucose Monitoring, Adult Monitoring your blood sugar (glucose) helps you manage your diabetes. It also helps you and your health care provider determine how well your diabetes management plan is working. Blood glucose monitoring involves checking your blood glucose as often as directed, and keeping a record (log) of your results over time. Why should I monitor my blood glucose? Checking your blood glucose regularly can:  Help you understand how food, exercise, illnesses, and medicines affect your blood glucose.  Let you know what your blood glucose is at any time. You can quickly tell if you are having low blood glucose (hypoglycemia) or high blood glucose (hyperglycemia).  Help you and your health care provider adjust your medicines as needed.  When should I check my blood glucose? Follow instructions from your health care provider about how often to check your blood glucose. This may depend on:  The type of diabetes you have.  How well-controlled your diabetes is.  Medicines you are taking.  If you have type 1 diabetes:  Check your blood glucose at least 2 times a day.  Also check your blood glucose: ? Before every insulin injection. ? Before and after exercise. ? Between meals. ? 2 hours after a meal. ? Occasionally between 2:00 a.m. and 3:00 a.m., as directed. ? Before potentially dangerous tasks, like driving or using heavy machinery. ? At bedtime.  You may need to check your blood glucose more often, up to 6-10 times a day: ? If you use an insulin pump. ? If you need multiple daily injections (MDI). ? If your diabetes is not well-controlled. ? If you are ill. ? If you have a history of severe hypoglycemia. ? If you have a history of not knowing when your blood glucose is getting low (hypoglycemia unawareness). If you have type 2 diabetes:  If you take insulin or other diabetes medicines, check your blood glucose at least 2 times a day.  If you are on  intensive insulin therapy, check your blood glucose at least 4 times a day. Occasionally, you may also need to check between 2:00 a.m. and 3:00 a.m., as directed.  Also check your blood glucose: ? Before and after exercise. ? Before potentially dangerous tasks, like driving or using heavy machinery.  You may need to check your blood glucose more often if: ? Your medicine is being adjusted. ? Your diabetes is not well-controlled. ? You are ill. What is a blood glucose log?  A blood glucose log is a record of your blood glucose readings. It helps you and your health care provider: ? Look for patterns in your blood glucose over time. ? Adjust your diabetes management plan as needed.  Every time you check your blood glucose, write down your result and notes about things that may be affecting your blood glucose, such as your diet and exercise for the day.  Most glucose meters store a record of glucose readings in the meter. Some meters allow you to download your records to a computer. How do I check my blood glucose? Follow these steps to get accurate readings of your blood glucose: Supplies needed   Blood glucose meter.  Test strips for your meter. Each meter has its own strips. You must use the strips that come with your meter.  A needle to prick your finger (lancet). Do not use lancets more than once.  A device that holds the lancet (lancing device).  A journal or log book to write down your results. Procedure  Wash your hands with soap and water.  Prick the side of your finger (not the tip) with the lancet. Use a different finger each time.  Gently rub the finger until a small drop of blood appears.  Follow instructions that come with your meter for inserting the test strip, applying blood to the strip, and using your blood glucose meter.  Write down your result and any notes. Alternative testing sites  Some meters allow you to use areas of your body other than your  finger (alternative sites) to test your blood.  If you think you may have hypoglycemia, or if you have hypoglycemia unawareness, do not use alternative sites. Use your finger instead.  Alternative sites may not be as accurate as the fingers, because blood flow is slower in these areas. This means that the result you get may be delayed, and it may be different from the result that you would get from your finger.  The most common alternative sites are: ? Forearm. ? Thigh. ? Palm of the hand. Additional tips  Always keep your supplies with you.  If you have questions or need help, all blood glucose meters have a 24-hour hotline number that you can call. You may also contact your health care provider.  After you use a few boxes of test strips, adjust (calibrate) your blood glucose meter by following instructions that came with your meter. This information is not intended to replace advice given to you by your health care provider. Make sure you discuss any questions you have with your health care provider. Document Released: 01/05/2003 Document Revised: 07/23/2015 Document Reviewed: 06/14/2015 Elsevier Interactive Patient Education  2017 [employer] Diabetic Ketoacidosis  Diabetic ketoacidosis is a life-threatening complication of diabetes. If it is not treated, it can cause severe dehydration and organ damage and can lead to a coma or death. What are the causes? This condition develops when there is not enough of the hormone insulin in the body. Insulin helps the body to break down sugar for energy. Without insulin, the body cannot break down sugar, so it breaks down fats instead. This leads to the production of acids that are called ketones. Ketones are poisonous at high levels. This condition can be triggered by:  Stress on the body that is brought on by an illness.  Medicines that raise blood glucose levels.  Not taking diabetes medicine.  What are the signs or  symptoms? Symptoms of this condition include:  Fatigue.  Weight loss.  Excessive thirst.  Light-headedness.  Fruity or sweet-smelling breath.  Excessive urination.  Vision changes.  Confusion or irritability.  Nausea.  Vomiting.  Rapid breathing.  Abdominal pain.  Feeling flushed.  How is this diagnosed? This condition is diagnosed based on a medical history, a physical exam, and blood tests. You may also have a urine test that checks for ketones. How is this treated? This condition may be treated with:  Fluid replacement. This may be done to correct dehydration.  Insulin injections. These may be given through the skin or through an IV tube.  Electrolyte replacement. Electrolytes, such as potassium and sodium, may be given in pill form or through an IV tube.  Antibiotic medicines. These may be prescribed if your condition was caused by an infection.  Follow these instructions at home: Eating and drinking  Drink enough fluids to keep your urine clear or pale yellow.  If you cannot eat, alternate between drinking fluids with sugar (such as juice) and salty fluids (such as  broth or bouillon).  If you can eat, follow your usual diet and drink sugar-free liquids, such as water. Other Instructions   Take insulin as directed by your health care provider. Do not skip insulin injections. Do not use expired insulin.  If your blood sugar is over 240 mg/dL, monitor your urine ketones every 4-6 hours.  If you were prescribed an antibiotic medicine, finish all of it even if you start to feel better.  Rest and exercise only as directed by your health care provider.  If you get sick, call your health care provider and begin treatment quickly. Your body often needs extra insulin to fight an illness.  Check your blood glucose levels regularly. If your blood glucose is high, drink plenty of fluids. This helps to flush out ketones. Contact a health care provider  if:  Your blood glucose level is too high or too low.  You have ketones in your urine.  You have a fever.  You cannot eat.  You cannot tolerate fluids.  You have been vomiting for more than 2 hours.  You continue to have symptoms of this condition.  You develop new symptoms. Get help right away if:  Your blood glucose levels continue to be high (elevated).  Your monitor reads high even when you are taking insulin.  You faint.  You have chest pain.  You have trouble breathing.  You have a sudden, severe headache.  You have sudden weakness in one arm or one leg.  You have sudden trouble speaking or swallowing.  You have vomiting or diarrhea that gets worse after 3 hours.  You feel severely fatigued.  You have trouble thinking.  You have abdominal pain.  You are severely dehydrated. Symptoms of severe dehydration include: ? Extreme thirst. ? Dry mouth. ? Blue lips. ? Cold hands and feet. ? Rapid breathing. This information is not intended to replace advice given to you by your health care provider. Make sure you discuss any questions you have with your health care provider. Document Released: 12/31/1999 Document Revised: 06/10/2015 Document Reviewed: 12/10/2013 Elsevier Interactive Patient Education  2017 [employer]

## 2016-07-28 ENCOUNTER — Emergency Department (HOSPITAL_COMMUNITY)
Admission: EM | Admit: 2016-07-28 | Discharge: 2016-07-28 | Disposition: A | Discharge status: Home / Self Care | Payer: Self-pay | Attending: Emergency Medicine | Admitting: Emergency Medicine

## 2016-07-28 ENCOUNTER — Emergency Department (HOSPITAL_COMMUNITY): Payer: Self-pay

## 2016-07-28 DIAGNOSIS — Z79899 Other long term (current) drug therapy: Secondary | ICD-10-CM | POA: Insufficient documentation

## 2016-07-28 DIAGNOSIS — R739 Hyperglycemia, unspecified: Secondary | ICD-10-CM | POA: Insufficient documentation

## 2016-07-28 DIAGNOSIS — R112 Nausea with vomiting, unspecified: Secondary | ICD-10-CM | POA: Insufficient documentation

## 2016-07-28 DIAGNOSIS — I10 Essential (primary) hypertension: Secondary | ICD-10-CM | POA: Insufficient documentation

## 2016-07-28 DIAGNOSIS — F17210 Nicotine dependence, cigarettes, uncomplicated: Secondary | ICD-10-CM | POA: Insufficient documentation

## 2016-07-28 DIAGNOSIS — Z7984 Long term (current) use of oral hypoglycemic drugs: Secondary | ICD-10-CM | POA: Insufficient documentation

## 2016-07-28 LAB — URINALYSIS, ROUTINE W REFLEX MICROSCOPIC
Bacteria, UA: NONE SEEN
Bilirubin Urine: NEGATIVE
Hgb urine dipstick: NEGATIVE
Ketones, ur: 80 mg/dL — AB
Leukocytes, UA: NEGATIVE
NITRITE: NEGATIVE
PROTEIN: 30 mg/dL — AB
SPECIFIC GRAVITY, URINE: 1.023 (ref 1.005–1.030)
pH: 5 (ref 5.0–8.0)

## 2016-07-28 LAB — I-STAT CHEM 8, ED
BUN: 35 mg/dL — AB (ref 6–20)
BUN: 37 mg/dL — ABNORMAL HIGH (ref 6–20)
CHLORIDE: 96 mmol/L — AB (ref 101–111)
CREATININE: 1.1 mg/dL (ref 0.61–1.24)
Calcium, Ion: 1.08 mmol/L — ABNORMAL LOW (ref 1.15–1.40)
Calcium, Ion: 1.15 mmol/L (ref 1.15–1.40)
Chloride: 94 mmol/L — ABNORMAL LOW (ref 101–111)
Creatinine, Ser: 1 mg/dL (ref 0.61–1.24)
Glucose, Bld: 448 mg/dL — ABNORMAL HIGH (ref 65–99)
Glucose, Bld: 383 mg/dL — ABNORMAL HIGH (ref 65–99)
HEMATOCRIT: 43 % (ref 39.0–52.0)
HEMATOCRIT: 39 % (ref 39.0–52.0)
HEMOGLOBIN: 13.3 g/dL (ref 13.0–17.0)
Hemoglobin: 14.6 g/dL (ref 13.0–17.0)
POTASSIUM: 4.4 mmol/L (ref 3.5–5.1)
Potassium: 4.3 mmol/L (ref 3.5–5.1)
SODIUM: 136 mmol/L (ref 135–145)
Sodium: 137 mmol/L (ref 135–145)
TCO2: 25 mmol/L (ref 0–100)
TCO2: 26 mmol/L (ref 0–100)

## 2016-07-28 LAB — CBC WITH DIFFERENTIAL/PLATELET
BASOS PCT: 1 %
Basophils Absolute: 0 10*3/uL (ref 0.0–0.1)
EOS ABS: 0.1 10*3/uL (ref 0.0–0.7)
EOS PCT: 1 %
HCT: 40 % (ref 39.0–52.0)
HEMOGLOBIN: 12.5 g/dL — AB (ref 13.0–17.0)
LYMPHS ABS: 1.5 10*3/uL (ref 0.7–4.0)
Lymphocytes Relative: 23 %
MCH: 24.8 pg — AB (ref 26.0–34.0)
MCHC: 31.3 g/dL (ref 30.0–36.0)
MCV: 79.2 fL (ref 78.0–100.0)
MONO ABS: 0.5 10*3/uL (ref 0.1–1.0)
MONOS PCT: 8 %
Neutro Abs: 4.3 10*3/uL (ref 1.7–7.7)
Neutrophils Relative %: 67 %
Platelets: 286 10*3/uL (ref 150–400)
RBC: 5.05 MIL/uL (ref 4.22–5.81)
RDW: 13 % (ref 11.5–15.5)
WBC: 6.4 10*3/uL (ref 4.0–10.5)

## 2016-07-28 LAB — I-STAT VENOUS BLOOD GAS, ED
BICARBONATE: 26.5 mmol/L (ref 20.0–28.0)
O2 SAT: 81 %
PCO2 VEN: 48.3 mmHg (ref 44.0–60.0)
PO2 VEN: 48 mmHg — AB (ref 32.0–45.0)
TCO2: 28 mmol/L (ref 0–100)
pH, Ven: 7.348 (ref 7.250–7.430)

## 2016-07-28 LAB — BASIC METABOLIC PANEL
ANION GAP: 21 — AB (ref 5–15)
BUN: 31 mg/dL — ABNORMAL HIGH (ref 6–20)
CHLORIDE: 92 mmol/L — AB (ref 101–111)
CO2: 24 mmol/L (ref 22–32)
Calcium: 9.5 mg/dL (ref 8.9–10.3)
Creatinine, Ser: 1.48 mg/dL — ABNORMAL HIGH (ref 0.61–1.24)
GFR calc Af Amer: >60 mL/min (ref >60)
GFR, EST NON AFRICAN AMERICAN: 52 mL/min — AB (ref >60)
GLUCOSE: 437 mg/dL — AB (ref 65–99)
Potassium: 4.2 mmol/L (ref 3.5–5.1)
SODIUM: 137 mmol/L (ref 135–145)

## 2016-07-28 LAB — CBG MONITORING, ED
GLUCOSE-CAPILLARY: 444 mg/dL — AB (ref 65–99)
GLUCOSE-CAPILLARY: 391 mg/dL — AB (ref 65–99)
GLUCOSE-CAPILLARY: 362 mg/dL — AB (ref 65–99)

## 2016-07-28 MED ORDER — INSULIN NPH (HUMAN) (ISOPHANE) 100 UNIT/ML ~~LOC~~ SUSP: SUBCUTANEOUS | 0 refills | Status: DC

## 2016-07-28 MED ORDER — INSULIN ASPART 100 UNIT/ML ~~LOC~~ SOLN
8.0000 [IU] | Freq: Once | SUBCUTANEOUS | Status: AC
Start: 2016-07-28 — End: 2016-07-28
  Administered 2016-07-28: 8 [IU] via SUBCUTANEOUS
  Filled 2016-07-28: qty 1

## 2016-07-28 MED ORDER — INSULIN ASPART 100 UNIT/ML ~~LOC~~ SOLN: 3.0000 [IU] | Freq: Three times a day (TID) | SUBCUTANEOUS | 11 refills | Status: DC

## 2016-07-28 MED ORDER — SODIUM CHLORIDE 0.9 % IV SOLN: INTRAVENOUS | Status: DC

## 2016-07-28 MED ORDER — METFORMIN HCL 500 MG PO TABS: 500.0000 mg | ORAL_TABLET | Freq: Two times a day (BID) | ORAL | 1 refills | Status: DC

## 2016-07-28 MED ORDER — SODIUM CHLORIDE 0.9 % IV BOLUS (SEPSIS)
1000.0000 mL | Freq: Once | INTRAVENOUS | Status: AC
  Administered 2016-07-28: 1000 mL via INTRAVENOUS

## 2016-07-28 MED ORDER — METOPROLOL TARTRATE 25 MG PO TABS: 25.0000 mg | ORAL_TABLET | Freq: Two times a day (BID) | ORAL | 0 refills | Status: DC

## 2016-07-28 MED ORDER — HYDRALAZINE HCL 50 MG PO TABS: 50.0000 mg | ORAL_TABLET | Freq: Three times a day (TID) | ORAL | 0 refills | Status: DC

## 2016-07-28 NOTE — Progress Notes (Signed)
CM consulted for medication and PCP.  CM noted on D/C from hospital admission from 6/18 [facility].  Spoke with pt who stated he got his medications but doesn't know what happened to them.  Pt is chronically homeless and [facility]. Discussed with providing team pt's ability to use a glucometer and everyone agreed pt is not able to provide self care with the equipment. CM will continue to follow prior [facility].

## 2016-07-28 NOTE — ED Triage Notes (Signed)
Pt. Coming from home via [facility] for altered mental status and high blood sugar. EMS reports 'high' reading on CBG initially. Pt. Given of normal saline en route. Pt. CBG lowered to 536 prior to arrival. Pt. Aox4. Strong urine smell noted from patient with poor hygiene. Pt. States he does not have insurance and is running out of insulin. Also reports not checking CBG regularly.

## 2016-07-28 NOTE — ED Provider Notes (Signed)
[facility]-EMERGENCY DEPT Provider Note   CSN: [id number] Arrival date & time: 07/28/16  1423     History   Chief Complaint Chief Complaint  Patient presents with  . Altered Mental Status  . Hyperglycemia    HPI Norvell Haberle is a 56 y.o. male.  HPI Pt presented to the ED for evaluation of high blood sugar, nausea and vomiting.  Pt has a history of diabetes.  He has not been able to afford his insulin and he ran out.    His blood sugar has been running high today.  He started having nausea and vomiting.   EMS was called to the home because of confusion.  Pt denies any headache, no chest pain, no  Fever.  Denies feeling confused.  EMS administered 300 cc of saline prior to arrival.  Past Medical History:  Diagnosis Date  . Anxiety   . Anxiety   . Chronic lower back pain   . Depression   . Hyperlipemia   . Hypertension   . Migraine    "last one was ~ 4 yr ago" (12/23/2014)  . Seizures (HCC)    "related to pills for anxiety; if I don't take the pills I'm suppose to take I'll have them" (12/23/2014)  . Type II diabetes mellitus [facility])     Patient Active Problem List   Diagnosis Date Noted  . Protein-calorie malnutrition, severe 12/24/2014  . Chest pain, pleuritic 12/23/2014  . Homelessness 12/23/2014  . DM (diabetes mellitus) (HCC) 12/23/2014  . Intermittent palpitations 12/23/2014  . Tobacco abuse 12/23/2014  . Pleuritic chest pain 12/23/2014  . Abnormal EKG 12/23/2014  . Cough   . Diabetes mellitus with complication [facility])     Past Surgical History:  Procedure Laterality Date  . high cholesterol    . NO PAST SURGERIES         Home Medications    Prior to Admission medications   Medication Sig Start Date End Date Taking? Authorizing Provider  metoprolol tartrate (LOPRESSOR) 25 MG tablet Take 1 tablet (25 mg total) by mouth 2 (two) times daily. 06/28/16  Yes Lonia Blood, MD  hydrALAZINE (APRESOLINE) 50 MG tablet Take 1 tablet (50 mg total) by mouth 3  (three) times daily. 06/28/16 06/28/17  Lonia Blood, MD  insulin aspart (NOVOLOG) 100 UNIT/ML injection Inject 3 Units into the skin 3 (three) times daily with meals. 07/28/16   Linwood Dibbles, MD  insulin NPH Human (NOVOLIN N) 100 UNIT/ML injection 12 units SQ BID 07/28/16 07/28/17  Linwood Dibbles, MD  Insulin Syringe 27G X 1/2" 0.5 ML MISC 1 Syringe by Does not apply route 5 (five) times daily. Patient not taking: Reported on 07/28/2016 06/28/16   Lonia Blood, MD  isosorbide dinitrate (ISORDIL) 20 MG tablet Take 1.5 tablets (30 mg total) by mouth 3 (three) times daily. Patient not taking: Reported on 07/28/2016 06/28/16   Lonia Blood, MD  metFORMIN (GLUCOPHAGE) 500 MG tablet Take 1 tablet (500 mg total) by mouth 2 (two) times daily with a meal. 07/28/16   Linwood Dibbles, MD    Family History No family history on file.  Social History Social History  Substance Use Topics  . Smoking status: Current Every Day Smoker    Packs/day: 0.10    Years: 32.00    Types: Cigarettes  . Smokeless tobacco: Never Used  . Alcohol use 1.2 oz/week    2 Cans of beer per week     Allergies   Ibuprofen and Tylenol [  acetaminophen]   Review of Systems Review of Systems  All other systems reviewed and are negative.    Physical Exam Updated Vital Signs BP (!) 172/87   Pulse 71   Temp 98.1 F (36.7 C) (Oral)   Resp 18   SpO2 100%   Physical Exam  Constitutional: He is oriented to person, place, and time. No distress.  thin  HENT:  Head: Normocephalic and atraumatic.  Right Ear: External ear normal.  Left Ear: External ear normal.  Mouth/Throat: No oropharyngeal exudate.  Mm dry   Eyes: Conjunctivae are normal. Right eye exhibits no discharge. Left eye exhibits no discharge. No scleral icterus.  Neck: Neck supple. No tracheal deviation present.  Cardiovascular: Normal rate, regular rhythm and intact distal pulses.   Pulmonary/Chest: Effort normal and breath sounds normal. No stridor. No  respiratory distress. He has no wheezes. He has no rales.  Abdominal: Soft. Bowel sounds are normal. He exhibits no distension. There is no tenderness. There is no rebound and no guarding.  Musculoskeletal: He exhibits no edema or tenderness.  Neurological: He is alert and oriented to person, place, and time. He has normal strength. No cranial nerve deficit (no facial droop, extraocular movements intact, no slurred speech) or sensory deficit. He exhibits normal muscle tone. He displays no seizure activity. Coordination normal.  Skin: Skin is warm and dry. No rash noted. He is not diaphoretic.  Psychiatric: He has a normal mood and affect.  Nursing note and vitals reviewed.    ED Treatments / Results  Labs (all labs ordered are listed, but only abnormal results are displayed) Labs Reviewed  CBC WITH DIFFERENTIAL/PLATELET - Abnormal; Notable for the following:       Result Value   Hemoglobin 12.5 (*)    MCH 24.8 (*)    All other components within normal limits  URINALYSIS, ROUTINE W REFLEX MICROSCOPIC - Abnormal; Notable for the following:    Glucose, UA >=500 (*)    Ketones, ur 80 (*)    Protein, ur 30 (*)    Squamous Epithelial / LPF 0-5 (*)    All other components within normal limits  BASIC METABOLIC PANEL - Abnormal; Notable for the following:    Chloride 92 (*)    Glucose, Bld 437 (*)    BUN 31 (*)    Creatinine, Ser 1.48 (*)    GFR calc non Af Amer 52 (*)    Anion gap 21 (*)    All other components within normal limits  CBG MONITORING, ED - Abnormal; Notable for the following:    Glucose-Capillary 444 (*)    All other components within normal limits  I-STAT CHEM 8, ED - Abnormal; Notable for the following:    Chloride 94 (*)    BUN 35 (*)    Glucose, Bld 448 (*)    Calcium, Ion 1.08 (*)    All other components within normal limits  I-STAT VENOUS BLOOD GAS, ED - Abnormal; Notable for the following:    pO2, Ven 48.0 (*)    All other components within normal limits  CBG  MONITORING, ED - Abnormal; Notable for the following:    Glucose-Capillary 391 (*)    All other components within normal limits  I-STAT CHEM 8, ED    Radiology Dg Chest Portable 1 View  Result Date: 07/28/2016 CLINICAL DATA:  Vomiting, confusion. EXAM: PORTABLE CHEST 1 VIEW COMPARISON:  Radiograph of June 24, 2016. FINDINGS: The heart size and mediastinal contours are within normal limits.  Both lungs are clear. No pneumothorax or pleural effusion is noted. The visualized skeletal structures are unremarkable. IMPRESSION: No acute cardiopulmonary abnormality seen. Electronically Signed   By: Lupita RaiderJames  Green Jr, M.D.   On: 07/28/2016 15:23    Procedures Procedures (including critical care time)  Medications Ordered in ED Medications  sodium chloride 0.9 % bolus 1,000 mL (1,000 mLs Intravenous New Bag/Given 07/28/16 1628)    And  sodium chloride 0.9 % bolus 1,000 mL (1,000 mLs Intravenous New Bag/Given 07/28/16 1517)    And  0.9 %  sodium chloride infusion (not administered)  insulin aspart (novoLOG) injection 8 Units (8 Units Subcutaneous Given 07/28/16 1628)     Initial Impression / Assessment and Plan / ED Course  I have reviewed the triage vital signs and the nursing notes.  Pertinent labs & imaging results that were available during my care of the patient were reviewed by me and considered in my medical decision making (see chart for details).  Clinical Course as of Jul 28 1645  Fri Jul 28, 2016  1600 Labs show hyperglycemia without dka.  Will give dose of insulin.  Continue with hydration.  [JK]  1603 Per dc summary.  Pt is supposed to be on insulin aspart 100 UNIT/ML injection Commonly known as:  novoLOG Inject 3 Units into the skin 3 (three) times daily with meals.  insulin NPH Human 100 UNIT/ML injection Commonly known as:  NOVOLIN N 12 units SQ BID    [JK]    Clinical Course User Index [JK] Linwood DibblesKnapp, Khianna Blazina, MD    Pt presents to the ED with hyperglycemia.  No signs of DKA or  other acute complication.  Pt has not been taking his insulin as prescribed last month when he was hospitalized.  Pt is homeless and I think there may be some cognitive issues.  Case management assisted with his medications.  Pt is certainly at risk for having recurrent hyperglycemia because he does not seem to understand the use of glucometer or giving insulin shots.  This has been the case since he was in the hospital.  We discussed having take oral medications in the meantime at least because he is not able to give himself the shots.  Case management will try and get him hooked up with his outpatient clinic to check on helping him with insulin use.  No indication for hospitalization at this time.    Final Clinical Impressions(s) / ED Diagnoses   Final diagnoses:  Hyperglycemia    New Prescriptions New Prescriptions   METFORMIN (GLUCOPHAGE) 500 MG TABLET    Take 1 tablet (500 mg total) by mouth 2 (two) times daily with a meal.     Linwood DibblesKnapp, Tris Howell, MD 07/28/16 [phone]

## 2016-07-28 NOTE — Discharge Instructions (Signed)
Make sure to take your insulin regularly, follow up with your primary care doctor, watch your sugar intake and remain hydrated

## 2016-07-28 NOTE — ED Notes (Signed)
Care management getting patient's meds. Will d/c to waiting room with bus pass and food to wait on care management. Pt. Agrees with plan.

## 2016-07-28 NOTE — ED Notes (Signed)
EDP at bedside  

## 2016-07-30 ENCOUNTER — Encounter (HOSPITAL_COMMUNITY): Payer: Self-pay | Admitting: Emergency Medicine

## 2016-07-30 ENCOUNTER — Inpatient Hospital Stay (HOSPITAL_COMMUNITY)
Admission: EM | Admit: 2016-07-30 | Discharge: 2016-08-06 | DRG: 637 | Disposition: A | Discharge status: Home / Self Care | Payer: Self-pay | Attending: Nephrology | Admitting: Nephrology

## 2016-07-30 DIAGNOSIS — E1129 Type 2 diabetes mellitus with other diabetic kidney complication: Secondary | ICD-10-CM | POA: Diagnosis present

## 2016-07-30 DIAGNOSIS — G9340 Encephalopathy, unspecified: Secondary | ICD-10-CM

## 2016-07-30 DIAGNOSIS — E1110 Type 2 diabetes mellitus with ketoacidosis without coma: Secondary | ICD-10-CM

## 2016-07-30 DIAGNOSIS — E860 Dehydration: Secondary | ICD-10-CM | POA: Diagnosis present

## 2016-07-30 DIAGNOSIS — E43 Unspecified severe protein-calorie malnutrition: Secondary | ICD-10-CM | POA: Diagnosis present

## 2016-07-30 DIAGNOSIS — I639 Cerebral infarction, unspecified: Secondary | ICD-10-CM | POA: Diagnosis present

## 2016-07-30 DIAGNOSIS — F419 Anxiety disorder, unspecified: Secondary | ICD-10-CM | POA: Diagnosis present

## 2016-07-30 DIAGNOSIS — E1022 Type 1 diabetes mellitus with diabetic chronic kidney disease: Secondary | ICD-10-CM | POA: Diagnosis present

## 2016-07-30 DIAGNOSIS — N179 Acute kidney failure, unspecified: Secondary | ICD-10-CM | POA: Diagnosis present

## 2016-07-30 DIAGNOSIS — N182 Chronic kidney disease, stage 2 (mild): Secondary | ICD-10-CM | POA: Diagnosis present

## 2016-07-30 DIAGNOSIS — F17210 Nicotine dependence, cigarettes, uncomplicated: Secondary | ICD-10-CM | POA: Diagnosis present

## 2016-07-30 DIAGNOSIS — F329 Major depressive disorder, single episode, unspecified: Secondary | ICD-10-CM | POA: Diagnosis present

## 2016-07-30 DIAGNOSIS — D631 Anemia in chronic kidney disease: Secondary | ICD-10-CM | POA: Diagnosis present

## 2016-07-30 DIAGNOSIS — G9341 Metabolic encephalopathy: Secondary | ICD-10-CM | POA: Diagnosis present

## 2016-07-30 DIAGNOSIS — Z720 Tobacco use: Secondary | ICD-10-CM | POA: Diagnosis present

## 2016-07-30 DIAGNOSIS — Z681 Body mass index (BMI) 19 or less, adult: Secondary | ICD-10-CM

## 2016-07-30 DIAGNOSIS — I129 Hypertensive chronic kidney disease with stage 1 through stage 4 chronic kidney disease, or unspecified chronic kidney disease: Secondary | ICD-10-CM | POA: Diagnosis present

## 2016-07-30 DIAGNOSIS — Z79899 Other long term (current) drug therapy: Secondary | ICD-10-CM

## 2016-07-30 DIAGNOSIS — E7800 Pure hypercholesterolemia, unspecified: Secondary | ICD-10-CM | POA: Diagnosis present

## 2016-07-30 DIAGNOSIS — R410 Disorientation, unspecified: Secondary | ICD-10-CM

## 2016-07-30 DIAGNOSIS — Z9114 Patient's other noncompliance with medication regimen: Secondary | ICD-10-CM

## 2016-07-30 DIAGNOSIS — I959 Hypotension, unspecified: Secondary | ICD-10-CM | POA: Diagnosis not present

## 2016-07-30 DIAGNOSIS — E1065 Type 1 diabetes mellitus with hyperglycemia: Secondary | ICD-10-CM | POA: Diagnosis present

## 2016-07-30 DIAGNOSIS — E1010 Type 1 diabetes mellitus with ketoacidosis without coma: Principal | ICD-10-CM | POA: Diagnosis present

## 2016-07-30 DIAGNOSIS — Z590 Homelessness: Secondary | ICD-10-CM

## 2016-07-30 DIAGNOSIS — H532 Diplopia: Secondary | ICD-10-CM | POA: Diagnosis present

## 2016-07-30 DIAGNOSIS — Z833 Family history of diabetes mellitus: Secondary | ICD-10-CM

## 2016-07-30 DIAGNOSIS — E1042 Type 1 diabetes mellitus with diabetic polyneuropathy: Secondary | ICD-10-CM | POA: Diagnosis present

## 2016-07-30 DIAGNOSIS — I160 Hypertensive urgency: Secondary | ICD-10-CM | POA: Diagnosis present

## 2016-07-30 HISTORY — DX: Type 2 diabetes mellitus with ketoacidosis without coma: E11.10

## 2016-07-30 LAB — CBG MONITORING, ED: Glucose-Capillary: 544 mg/dL (ref 65–99)

## 2016-07-30 MED ORDER — METOPROLOL TARTRATE 25 MG PO TABS
25.0000 mg | ORAL_TABLET | Freq: Once | ORAL | Status: AC
  Administered 2016-07-31: 25 mg via ORAL
  Filled 2016-07-30: qty 1

## 2016-07-30 MED ORDER — INSULIN ASPART 100 UNIT/ML ~~LOC~~ SOLN
10.0000 [IU] | Freq: Once | SUBCUTANEOUS | Status: AC
  Administered 2016-07-31: 10 [IU] via INTRAVENOUS
  Filled 2016-07-30: qty 1

## 2016-07-30 MED ORDER — SODIUM CHLORIDE 0.9 % IV BOLUS (SEPSIS)
1000.0000 mL | Freq: Once | INTRAVENOUS | Status: AC
  Administered 2016-07-31: 1000 mL via INTRAVENOUS

## 2016-07-30 MED ORDER — METFORMIN HCL 500 MG PO TABS
500.0000 mg | ORAL_TABLET | Freq: Once | ORAL | Status: AC
  Administered 2016-07-31: 500 mg via ORAL
  Filled 2016-07-30: qty 1

## 2016-07-30 MED ORDER — HYDRALAZINE HCL 25 MG PO TABS: 50.0000 mg | ORAL_TABLET | Freq: Once | ORAL | Status: DC

## 2016-07-30 NOTE — ED Provider Notes (Signed)
[facility]-EMERGENCY DEPT Provider Note   CSN: [id number] Arrival date & time: 07/30/16  2252     History   Chief Complaint Chief Complaint  Patient presents with  . Hyperglycemia  . Fatigue    HPI Mike [name] is a 56 y.o. male.  The history is provided by the patient.  He was brought to the ED after being found laying down in front of [employer]. He is a very poor and evasive historian, but is complaining of generalized weakness. He also states that he wanted his blood pressure checked. He denies headache, chest pain, nausea, vomiting. He had been sweating. He denies fever or chills. On initial questioning, he states that he had been compliant with his insulin and his antihypertensive medications. However, on more persistent questioning, he thinks that maybe he might have skipped some doses.  Past Medical History:  Diagnosis Date  . Anxiety   . Anxiety   . Chronic lower back pain   . Depression   . Hyperlipemia   . Hypertension   . Migraine    "last one was ~ 4 yr ago" (12/23/2014)  . Seizures (HCC)    "related to pills for anxiety; if I don't take the pills I'm suppose to take I'll have them" (12/23/2014)  . Type II diabetes mellitus [facility])     Patient Active Problem List   Diagnosis Date Noted  . Protein-calorie malnutrition, severe 12/24/2014  . Chest pain, pleuritic 12/23/2014  . Homelessness 12/23/2014  . DM (diabetes mellitus) (HCC) 12/23/2014  . Intermittent palpitations 12/23/2014  . Tobacco abuse 12/23/2014  . Pleuritic chest pain 12/23/2014  . Abnormal EKG 12/23/2014  . Cough   . Diabetes mellitus with complication [facility])     Past Surgical History:  Procedure Laterality Date  . high cholesterol    . NO PAST SURGERIES         Home Medications    Prior to Admission medications   Medication Sig Start Date End Date Taking? Authorizing Provider  hydrALAZINE (APRESOLINE) 50 MG tablet Take 1 tablet (50 mg total) by mouth 3 (three) times daily.  07/28/16 07/28/17  Linwood Dibbles, MD  insulin aspart (NOVOLOG) 100 UNIT/ML injection Inject 3 Units into the skin 3 (three) times daily with meals. 07/28/16   Linwood Dibbles, MD  insulin NPH Human (NOVOLIN N) 100 UNIT/ML injection 12 units SQ BID 07/28/16 07/28/17  Linwood Dibbles, MD  Insulin Syringe 27G X 1/2" 0.5 ML MISC 1 Syringe by Does not apply route 5 (five) times daily. Patient not taking: Reported on 07/28/2016 06/28/16   Lonia Blood, MD  isosorbide dinitrate (ISORDIL) 20 MG tablet Take 1.5 tablets (30 mg total) by mouth 3 (three) times daily. Patient not taking: Reported on 07/28/2016 06/28/16   Lonia Blood, MD  metFORMIN (GLUCOPHAGE) 500 MG tablet Take 1 tablet (500 mg total) by mouth 2 (two) times daily with a meal. 07/28/16   Linwood Dibbles, MD  metoprolol tartrate (LOPRESSOR) 25 MG tablet Take 1 tablet (25 mg total) by mouth 2 (two) times daily. 07/28/16   Linwood Dibbles, MD    Family History No family history on file.  Social History Social History  Substance Use Topics  . Smoking status: Current Every Day Smoker    Packs/day: 0.10    Years: 32.00    Types: Cigarettes  . Smokeless tobacco: Never Used  . Alcohol use 1.2 oz/week    2 Cans of beer per week     Allergies   Ibuprofen  and Tylenol [acetaminophen]   Review of Systems Review of Systems  All other systems reviewed and are negative.    Physical Exam Updated Vital Signs BP (!) 208/97   Pulse 100   Temp 99.3 F (37.4 C) (Oral)   Resp 16   SpO2 99%   Physical Exam  Nursing note and vitals reviewed.  56 year old male, resting comfortably and in no acute distress. Vital signs are significant for hypertension. Oxygen saturation is 99%, which is normal. Head is normocephalic and atraumatic. PERRLA, EOMI. Oropharynx is clear. Neck is nontender and supple without adenopathy or JVD. Back is nontender and there is no CVA tenderness. Lungs are clear without rales, wheezes, or rhonchi. Chest is nontender. Heart has  regular rate and rhythm without murmur. Abdomen is soft, flat, nontender without masses or hepatosplenomegaly and peristalsis is normoactive. Extremities have no cyanosis or edema, full range of motion is present. Skin is warm and dry without rash. Neurologic: He is awake and oriented but somewhat lethargic, cranial nerves are intact, there are no motor or sensory deficits.  ED Treatments / Results  Labs (all labs ordered are listed, but only abnormal results are displayed) Labs Reviewed  BASIC METABOLIC PANEL - Abnormal; Notable for the following:       Result Value   Sodium 134 (*)    Chloride 98 (*)    CO2 9 (*)    Glucose, Bld 536 (*)    BUN 52 (*)    Creatinine, Ser 2.31 (*)    GFR calc non Af Amer 30 (*)    GFR calc Af Amer 35 (*)    Anion gap 27 (*)    All other components within normal limits  CBC - Abnormal; Notable for the following:    Hemoglobin 12.8 (*)    MCH 25.6 (*)    All other components within normal limits  URINALYSIS, ROUTINE W REFLEX MICROSCOPIC - Abnormal; Notable for the following:    Color, Urine STRAW (*)    Glucose, UA >=500 (*)    Hgb urine dipstick SMALL (*)    Ketones, ur 80 (*)    Protein, ur 30 (*)    Bacteria, UA RARE (*)    Squamous Epithelial / LPF 0-5 (*)    All other components within normal limits  MAGNESIUM - Abnormal; Notable for the following:    Magnesium 2.5 (*)    All other components within normal limits  BASIC METABOLIC PANEL - Abnormal; Notable for the following:    CO2 12 (*)    Glucose, Bld 375 (*)    BUN 47 (*)    Creatinine, Ser 1.99 (*)    Calcium 8.5 (*)    GFR calc non Af Amer 36 (*)    GFR calc Af Amer 42 (*)    Anion gap 21 (*)    All other components within normal limits  CBG MONITORING, ED - Abnormal; Notable for the following:    Glucose-Capillary 544 (*)    All other components within normal limits  CBG MONITORING, ED - Abnormal; Notable for the following:    Glucose-Capillary 450 (*)    All other  components within normal limits  I-STAT VENOUS BLOOD GAS, ED - Abnormal; Notable for the following:    pCO2, Ven 27.5 (*)    Bicarbonate 12.3 (*)    Acid-base deficit 13.0 (*)    All other components within normal limits  CBG MONITORING, ED - Abnormal; Notable for the following:  Glucose-Capillary 349 (*)    All other components within normal limits  RAPID URINE DRUG SCREEN, HOSP PERFORMED  ETHANOL  PHOSPHORUS  DIFFERENTIAL  BASIC METABOLIC PANEL  BASIC METABOLIC PANEL  BASIC METABOLIC PANEL  CREATININE, URINE, RANDOM  SODIUM, URINE, RANDOM     Procedures Procedures (including critical care time) CRITICAL CARE Performed by: ZOXWR,UEAVW Total critical care time: 50 minutes Critical care time was exclusive of separately billable procedures and treating other patients. Critical care was necessary to treat or prevent imminent or life-threatening deterioration. Critical care was time spent personally by me on the following activities: development of treatment plan with patient and/or surrogate as well as nursing, discussions with consultants, evaluation of patient's response to treatment, examination of patient, obtaining history from patient or surrogate, ordering and performing treatments and interventions, ordering and review of laboratory studies, ordering and review of radiographic studies, pulse oximetry and re-evaluation of patient's condition.  Medications Ordered in ED Medications  sodium chloride 0.9 % bolus 1,000 mL (not administered)  dextrose 5 %-0.45 % sodium chloride infusion (not administered)  insulin regular bolus via infusion 0-10 Units (not administered)  insulin regular (NOVOLIN R,HUMULIN R) 100 Units in sodium chloride 0.9 % 100 mL (1 Units/mL) infusion (2.9 Units/hr Intravenous New Bag/Given 07/31/16 0417)  dextrose 50 % solution 25 mL (not administered)  0.9 %  sodium chloride infusion ( Intravenous New Bag/Given 07/31/16 0407)  hydrALAZINE (APRESOLINE)  injection 5 mg (not administered)  hydrALAZINE (APRESOLINE) tablet 50 mg (50 mg Oral Given 07/31/16 0451)  metoprolol tartrate (LOPRESSOR) tablet 25 mg (25 mg Oral Given 07/31/16 0453)  isosorbide dinitrate (ISORDIL) tablet 30 mg (not administered)  ondansetron (ZOFRAN) injection 4 mg (not administered)  nicotine (NICODERM CQ - dosed in mg/24 hours) patch 21 mg (not administered)  0.9 %  sodium chloride infusion (not administered)  dextrose 5 %-0.45 % sodium chloride infusion (not administered)  enoxaparin (LOVENOX) injection 40 mg (not administered)  potassium chloride 10 mEq in 100 mL IVPB (not administered)  zolpidem (AMBIEN) tablet 5 mg (not administered)  metoprolol tartrate (LOPRESSOR) tablet 25 mg (25 mg Oral Given 07/31/16 0057)  metFORMIN (GLUCOPHAGE) tablet 500 mg (500 mg Oral Given 07/31/16 0057)  sodium chloride 0.9 % bolus 1,000 mL (0 mLs Intravenous Stopped 07/31/16 0125)  insulin aspart (novoLOG) injection 10 Units (10 Units Intravenous Given 07/31/16 0056)  hydrALAZINE (APRESOLINE) injection 10 mg (10 mg Intravenous Given 07/31/16 0407)     Initial Impression / Assessment and Plan / ED Course  I have reviewed the triage vital signs and the nursing notes.  Pertinent labs & imaging results that were available during my care of the patient were reviewed by me and considered in my medical decision making (see chart for details).  Hypertension, lethargy. Old records are reviewed, and he has numerous ED visits related to blood pressure and glucose. Hospitalized last month with ketoacidosis. He has a well-known history of medication noncompliance. I suspect that is the reason for his poorly controlled blood glucose and blood pressure. He is given IV fluids, insulin. He is given oral hydralazine and metoprolol. We'll also check ethanol level and drug screen.  Laboratory workup is consistent with ketoacidosis with CO2 of 9, anion gap of 27. He also shows evidence of acute kidney injury with  creatinine 2.31, which was 1.04 days ago. Ethanol was undetectable. He is started on glucose stabilizer, and will need to be admitted to stepdown unit. Case is discussed with Dr. Clyde Lundborg of [facility] who agrees  to admit the patient.  Final Clinical Impressions(s) / ED Diagnoses   Final diagnoses:  Type 1 diabetes mellitus with ketoacidosis without coma (HCC)  Acute kidney injury (nontraumatic) (HCC)  Acute encephalopathy    New Prescriptions New Prescriptions   No medications on file     Dione BoozeGlick, Ginelle Bays, MD 07/31/16 [id number]

## 2016-07-30 NOTE — ED Triage Notes (Signed)
Pt arrives via ems. Was laying down in front of [employer]. Pt c/o of lethargy and sweating. EMS first CBG read high. Has had 300cc of NS.

## 2016-07-31 ENCOUNTER — Inpatient Hospital Stay (HOSPITAL_COMMUNITY): Payer: Self-pay

## 2016-07-31 ENCOUNTER — Encounter (HOSPITAL_COMMUNITY): Payer: Self-pay | Admitting: Internal Medicine

## 2016-07-31 DIAGNOSIS — Z590 Homelessness: Secondary | ICD-10-CM

## 2016-07-31 DIAGNOSIS — E118 Type 2 diabetes mellitus with unspecified complications: Secondary | ICD-10-CM

## 2016-07-31 DIAGNOSIS — N179 Acute kidney failure, unspecified: Secondary | ICD-10-CM | POA: Diagnosis present

## 2016-07-31 DIAGNOSIS — Z720 Tobacco use: Secondary | ICD-10-CM

## 2016-07-31 DIAGNOSIS — G9341 Metabolic encephalopathy: Secondary | ICD-10-CM | POA: Diagnosis present

## 2016-07-31 DIAGNOSIS — E1110 Type 2 diabetes mellitus with ketoacidosis without coma: Secondary | ICD-10-CM

## 2016-07-31 DIAGNOSIS — I160 Hypertensive urgency: Secondary | ICD-10-CM

## 2016-07-31 DIAGNOSIS — N182 Chronic kidney disease, stage 2 (mild): Secondary | ICD-10-CM

## 2016-07-31 LAB — BASIC METABOLIC PANEL
ANION GAP: 21 — AB (ref 5–15)
ANION GAP: 18 — AB (ref 5–15)
ANION GAP: 10 (ref 5–15)
ANION GAP: 8 (ref 5–15)
Anion gap: 27 — ABNORMAL HIGH (ref 5–15)
BUN: 52 mg/dL — ABNORMAL HIGH (ref 6–20)
BUN: 47 mg/dL — AB (ref 6–20)
BUN: 37 mg/dL — ABNORMAL HIGH (ref 6–20)
BUN: 31 mg/dL — ABNORMAL HIGH (ref 6–20)
BUN: 33 mg/dL — ABNORMAL HIGH (ref 6–20)
CALCIUM: 9.2 mg/dL (ref 8.9–10.3)
CHLORIDE: 111 mmol/L (ref 101–111)
CHLORIDE: 114 mmol/L — AB (ref 101–111)
CHLORIDE: 116 mmol/L — AB (ref 101–111)
CO2: 12 mmol/L — AB (ref 22–32)
CO2: 12 mmol/L — AB (ref 22–32)
CO2: 19 mmol/L — AB (ref 22–32)
CO2: 22 mmol/L (ref 22–32)
CO2: 9 mmol/L — AB (ref 22–32)
CREATININE: 2.31 mg/dL — AB (ref 0.61–1.24)
Calcium: 8.5 mg/dL — ABNORMAL LOW (ref 8.9–10.3)
Calcium: 8.8 mg/dL — ABNORMAL LOW (ref 8.9–10.3)
Calcium: 9.1 mg/dL (ref 8.9–10.3)
Calcium: 9 mg/dL (ref 8.9–10.3)
Chloride: 98 mmol/L — ABNORMAL LOW (ref 101–111)
Chloride: 107 mmol/L (ref 101–111)
Creatinine, Ser: 1.99 mg/dL — ABNORMAL HIGH (ref 0.61–1.24)
Creatinine, Ser: 1.64 mg/dL — ABNORMAL HIGH (ref 0.61–1.24)
Creatinine, Ser: 1.2 mg/dL (ref 0.61–1.24)
Creatinine, Ser: 1.18 mg/dL (ref 0.61–1.24)
GFR calc Af Amer: 35 mL/min — ABNORMAL LOW (ref >60)
GFR calc Af Amer: 42 mL/min — ABNORMAL LOW (ref >60)
GFR calc Af Amer: 53 mL/min — ABNORMAL LOW (ref >60)
GFR calc Af Amer: >60 mL/min (ref >60)
GFR calc Af Amer: >60 mL/min (ref >60)
GFR calc non Af Amer: 36 mL/min — ABNORMAL LOW (ref >60)
GFR calc non Af Amer: 46 mL/min — ABNORMAL LOW (ref >60)
GFR, EST NON AFRICAN AMERICAN: 30 mL/min — AB (ref >60)
GLUCOSE: 375 mg/dL — AB (ref 65–99)
GLUCOSE: 293 mg/dL — AB (ref 65–99)
GLUCOSE: 126 mg/dL — AB (ref 65–99)
Glucose, Bld: 536 mg/dL (ref 65–99)
Glucose, Bld: 117 mg/dL — ABNORMAL HIGH (ref 65–99)
POTASSIUM: 4.1 mmol/L (ref 3.5–5.1)
POTASSIUM: 4.2 mmol/L (ref 3.5–5.1)
POTASSIUM: 3.7 mmol/L (ref 3.5–5.1)
POTASSIUM: 3.8 mmol/L (ref 3.5–5.1)
Potassium: 4.9 mmol/L (ref 3.5–5.1)
SODIUM: 134 mmol/L — AB (ref 135–145)
Sodium: 140 mmol/L (ref 135–145)
Sodium: 141 mmol/L (ref 135–145)
Sodium: 143 mmol/L (ref 135–145)
Sodium: 146 mmol/L — ABNORMAL HIGH (ref 135–145)

## 2016-07-31 LAB — CBC
HCT: 40.2 % (ref 39.0–52.0)
HEMOGLOBIN: 12.8 g/dL — AB (ref 13.0–17.0)
MCH: 25.6 pg — AB (ref 26.0–34.0)
MCHC: 31.8 g/dL (ref 30.0–36.0)
MCV: 80.4 fL (ref 78.0–100.0)
PLATELETS: 351 10*3/uL (ref 150–400)
RBC: 5 MIL/uL (ref 4.22–5.81)
RDW: 12.8 % (ref 11.5–15.5)
WBC: 9.7 10*3/uL (ref 4.0–10.5)

## 2016-07-31 LAB — URINALYSIS, ROUTINE W REFLEX MICROSCOPIC
Bilirubin Urine: NEGATIVE
Ketones, ur: 80 mg/dL — AB
Leukocytes, UA: NEGATIVE
NITRITE: NEGATIVE
PH: 5 (ref 5.0–8.0)
Protein, ur: 30 mg/dL — AB
SPECIFIC GRAVITY, URINE: 1.02 (ref 1.005–1.030)

## 2016-07-31 LAB — CBG MONITORING, ED
GLUCOSE-CAPILLARY: 450 mg/dL — AB (ref 65–99)
GLUCOSE-CAPILLARY: 303 mg/dL — AB (ref 65–99)
Glucose-Capillary: 349 mg/dL — ABNORMAL HIGH (ref 65–99)

## 2016-07-31 LAB — I-STAT VENOUS BLOOD GAS, ED
ACID-BASE DEFICIT: 13 mmol/L — AB (ref 0.0–2.0)
Bicarbonate: 12.3 mmol/L — ABNORMAL LOW (ref 20.0–28.0)
O2 Saturation: 75 %
PCO2 VEN: 27.5 mmHg — AB (ref 44.0–60.0)
PO2 VEN: 45 mmHg (ref 32.0–45.0)
TCO2: 13 mmol/L (ref 0–100)
pH, Ven: 7.258 (ref 7.250–7.430)

## 2016-07-31 LAB — GLUCOSE, CAPILLARY
GLUCOSE-CAPILLARY: 229 mg/dL — AB (ref 65–99)
GLUCOSE-CAPILLARY: 194 mg/dL — AB (ref 65–99)
GLUCOSE-CAPILLARY: 175 mg/dL — AB (ref 65–99)
GLUCOSE-CAPILLARY: 126 mg/dL — AB (ref 65–99)
GLUCOSE-CAPILLARY: 119 mg/dL — AB (ref 65–99)
GLUCOSE-CAPILLARY: 136 mg/dL — AB (ref 65–99)
Glucose-Capillary: 302 mg/dL — ABNORMAL HIGH (ref 65–99)
Glucose-Capillary: 118 mg/dL — ABNORMAL HIGH (ref 65–99)
Glucose-Capillary: 184 mg/dL — ABNORMAL HIGH (ref 65–99)
Glucose-Capillary: 329 mg/dL — ABNORMAL HIGH (ref 65–99)

## 2016-07-31 LAB — DIFFERENTIAL
BASOS ABS: 0 10*3/uL (ref 0.0–0.1)
Basophils Relative: 0 %
Eosinophils Absolute: 0 10*3/uL (ref 0.0–0.7)
Eosinophils Relative: 0 %
LYMPHS ABS: 1 10*3/uL (ref 0.7–4.0)
Lymphocytes Relative: 10 %
MONO ABS: 0.5 10*3/uL (ref 0.1–1.0)
MONOS PCT: 5 %
NEUTROS ABS: 8.3 10*3/uL — AB (ref 1.7–7.7)
Neutrophils Relative %: 85 %

## 2016-07-31 LAB — RAPID URINE DRUG SCREEN, HOSP PERFORMED
AMPHETAMINES: NOT DETECTED
BARBITURATES: NOT DETECTED
BENZODIAZEPINES: NOT DETECTED
Cocaine: NOT DETECTED
Opiates: NOT DETECTED
TETRAHYDROCANNABINOL: NOT DETECTED

## 2016-07-31 LAB — SODIUM, URINE, RANDOM: Sodium, Ur: 21 mmol/L

## 2016-07-31 LAB — ETHANOL

## 2016-07-31 LAB — CREATININE, URINE, RANDOM: Creatinine, Urine: 44.19 mg/dL

## 2016-07-31 LAB — MAGNESIUM: MAGNESIUM: 2.5 mg/dL — AB (ref 1.7–2.4)

## 2016-07-31 LAB — PHOSPHORUS: PHOSPHORUS: 3 mg/dL (ref 2.5–4.6)

## 2016-07-31 MED ORDER — DEXTROSE 50 % IV SOLN: 25.0000 mL | INTRAVENOUS | Status: DC | PRN

## 2016-07-31 MED ORDER — INSULIN ASPART 100 UNIT/ML ~~LOC~~ SOLN
5.0000 [IU] | Freq: Once | SUBCUTANEOUS | Status: AC
Start: 2016-07-31 — End: 2016-07-31
  Administered 2016-07-31: 5 [IU] via SUBCUTANEOUS

## 2016-07-31 MED ORDER — ISOSORBIDE DINITRATE 30 MG PO TABS
30.0000 mg | ORAL_TABLET | Freq: Three times a day (TID) | ORAL | Status: DC
  Administered 2016-07-31 – 2016-08-06 (×18): 30 mg via ORAL
  Filled 2016-07-31 (×21): qty 1

## 2016-07-31 MED ORDER — DEXTROSE-NACL 5-0.45 % IV SOLN: INTRAVENOUS | Status: DC

## 2016-07-31 MED ORDER — INSULIN REGULAR BOLUS VIA INFUSION
0.0000 [IU] | Freq: Three times a day (TID) | INTRAVENOUS | Status: DC
  Filled 2016-07-31: qty 10

## 2016-07-31 MED ORDER — NICOTINE 21 MG/24HR TD PT24
21.0000 mg | MEDICATED_PATCH | Freq: Every day | TRANSDERMAL | Status: DC
  Administered 2016-07-31 – 2016-08-06 (×7): 21 mg via TRANSDERMAL
  Filled 2016-07-31 (×7): qty 1

## 2016-07-31 MED ORDER — HYDRALAZINE HCL 50 MG PO TABS
50.0000 mg | ORAL_TABLET | Freq: Three times a day (TID) | ORAL | Status: DC
  Administered 2016-07-31 – 2016-08-06 (×19): 50 mg via ORAL
  Filled 2016-07-31 (×4): qty 1
  Filled 2016-07-31: qty 2
  Filled 2016-07-31 (×14): qty 1

## 2016-07-31 MED ORDER — SODIUM CHLORIDE 0.9 % IV SOLN
INTRAVENOUS | Status: DC
  Administered 2016-07-31: 04:00:00 via INTRAVENOUS

## 2016-07-31 MED ORDER — HYDRALAZINE HCL 20 MG/ML IJ SOLN
10.0000 mg | Freq: Once | INTRAMUSCULAR | Status: AC
  Administered 2016-07-31: 10 mg via INTRAVENOUS
  Filled 2016-07-31: qty 1

## 2016-07-31 MED ORDER — ZOLPIDEM TARTRATE 5 MG PO TABS: 5.0000 mg | ORAL_TABLET | Freq: Every evening | ORAL | Status: DC | PRN

## 2016-07-31 MED ORDER — INSULIN GLARGINE 100 UNIT/ML ~~LOC~~ SOLN
10.0000 [IU] | Freq: Every day | SUBCUTANEOUS | Status: DC
  Administered 2016-07-31 – 2016-08-01 (×2): 10 [IU] via SUBCUTANEOUS
  Filled 2016-07-31 (×2): qty 0.1

## 2016-07-31 MED ORDER — POTASSIUM CHLORIDE 10 MEQ/100ML IV SOLN: 10.0000 meq | INTRAVENOUS | Status: AC

## 2016-07-31 MED ORDER — INSULIN GLARGINE 100 UNIT/ML ~~LOC~~ SOLN: 10.0000 [IU] | Freq: Every day | SUBCUTANEOUS | Status: DC

## 2016-07-31 MED ORDER — HYDRALAZINE HCL 20 MG/ML IJ SOLN
5.0000 mg | INTRAMUSCULAR | Status: DC | PRN
  Administered 2016-07-31 – 2016-08-03 (×5): 5 mg via INTRAVENOUS
  Filled 2016-07-31 (×5): qty 1

## 2016-07-31 MED ORDER — ONDANSETRON HCL 4 MG/2ML IJ SOLN: 4.0000 mg | Freq: Three times a day (TID) | INTRAMUSCULAR | Status: DC | PRN

## 2016-07-31 MED ORDER — SODIUM CHLORIDE 0.9 % IV SOLN
INTRAVENOUS | Status: DC
  Administered 2016-07-31: 2.9 [IU]/h via INTRAVENOUS
  Filled 2016-07-31: qty 1

## 2016-07-31 MED ORDER — SODIUM CHLORIDE 0.9 % IV SOLN: INTRAVENOUS | Status: DC

## 2016-07-31 MED ORDER — METOPROLOL TARTRATE 25 MG PO TABS
25.0000 mg | ORAL_TABLET | Freq: Two times a day (BID) | ORAL | Status: DC
  Administered 2016-07-31 (×3): 25 mg via ORAL
  Filled 2016-07-31 (×3): qty 1

## 2016-07-31 MED ORDER — SODIUM CHLORIDE 0.9 % IV BOLUS (SEPSIS): 1000.0000 mL | Freq: Once | INTRAVENOUS | Status: DC

## 2016-07-31 MED ORDER — INSULIN ASPART 100 UNIT/ML ~~LOC~~ SOLN
0.0000 [IU] | Freq: Three times a day (TID) | SUBCUTANEOUS | Status: DC
  Administered 2016-07-31: 2 [IU] via SUBCUTANEOUS
  Administered 2016-08-01: 5 [IU] via SUBCUTANEOUS
  Administered 2016-08-01: 3 [IU] via SUBCUTANEOUS
  Administered 2016-08-01: 5 [IU] via SUBCUTANEOUS
  Administered 2016-08-02: 3 [IU] via SUBCUTANEOUS
  Administered 2016-08-02: 5 [IU] via SUBCUTANEOUS
  Administered 2016-08-02: 3 [IU] via SUBCUTANEOUS
  Administered 2016-08-03: 5 [IU] via SUBCUTANEOUS
  Administered 2016-08-03 (×2): 3 [IU] via SUBCUTANEOUS
  Administered 2016-08-04: 7 [IU] via SUBCUTANEOUS
  Administered 2016-08-04: 5 [IU] via SUBCUTANEOUS
  Administered 2016-08-04: 7 [IU] via SUBCUTANEOUS
  Administered 2016-08-05: 5 [IU] via SUBCUTANEOUS
  Administered 2016-08-05: 2 [IU] via SUBCUTANEOUS
  Administered 2016-08-05: 3 [IU] via SUBCUTANEOUS
  Administered 2016-08-06: 7 [IU] via SUBCUTANEOUS
  Administered 2016-08-06: 5 [IU] via SUBCUTANEOUS

## 2016-07-31 MED ORDER — DEXTROSE-NACL 5-0.45 % IV SOLN
INTRAVENOUS | Status: DC
  Administered 2016-07-31 (×2): via INTRAVENOUS

## 2016-07-31 MED ORDER — ENOXAPARIN SODIUM 40 MG/0.4ML ~~LOC~~ SOLN
40.0000 mg | SUBCUTANEOUS | Status: DC
  Administered 2016-07-31 – 2016-08-06 (×7): 40 mg via SUBCUTANEOUS
  Filled 2016-07-31 (×7): qty 0.4

## 2016-07-31 NOTE — ED Notes (Signed)
The pt has voided in the floor and he has also voided on his sheet.  Bed changed with clean sheets  He lies half in and out of the bed

## 2016-07-31 NOTE — Progress Notes (Signed)
Subjective:  Patient admitted this morning, see detailed H&P by Dr Clyde LundborgNiu.  56 year old male with a history of hypertension, hyperlipidemia, diabetes mellitus, depression, anxiety, seizure, tobacco abuse, CK D stage II who presented with altered mental status. Patient was found to be in DKA with anion gap of 27. Patient started on DKA protocol, IV insulin.  Patient seen and examined, denies any pain at this time.  Vitals:   07/31/16 0530 07/31/16 0631  BP: (!) 176/78 (!) 174/74  Pulse:  86  Resp:  16  Temp:  98.8 F (37.1 C)      A/P Diabetic ketoacidosis Hypertensive urgency Acute encephalopathy  Anion gap is 20, patient is currently on IV insulin. Blood pressures are controlled Encephalopathy has improved.  Continue DKA protocol.   Meredeth IdeGagan S Tayshun Gappa [facility] [phone]- [phone]

## 2016-07-31 NOTE — Care Management Note (Addendum)
Case Management Note  Patient Details  Name: Mike [name] MRN: [id number] Date of Birth: 1960/04/30  Subjective/Objective: Pt presented for AMS- found to be in DKA (blood sugar 536) on admission. Initiated on Glucose Stabilizer. Diabetes Coordinator is following. Outpatient Diabetes Medications:  70/30 20 units BID, Metformin 500 mg BID. Pt uses the [facility] as his PCP- [name] is the NP that follows him. Pt should be able to get medications from [facility]. Pt is without insurance and is homeless. CSW is following for shelters/ housing resources.                    Action/Plan: CM will continue to monitor for additional needs.   Expected Discharge Date:                  Expected Discharge Plan:  Home/Self Care  In-House Referral:  Clinical Social Work (Question Homeless- CSW aware to consult. )  Discharge planning Services  CM Consult, Medication Assistance, Indigent Health Clinic  Post Acute Care Choice:    Choice offered to:     DME Arranged:    DME Agency:     HH Arranged:    HH Agency:     Status of Service:  In process, will continue to follow  If discussed at Long Length of Stay Meetings, dates discussed:    Additional Comments:  Gala LewandowskyGraves-Bigelow, Amberlynn Tempesta Kaye, RN 07/31/2016, 3:10 PM

## 2016-07-31 NOTE — ED Notes (Signed)
Report attempted unsuccessful  Chg did not know they were getting this pt  It has been assigned for 40 minutes

## 2016-07-31 NOTE — ED Notes (Signed)
Checked CBG 349, RN Chris informed

## 2016-07-31 NOTE — ED Notes (Signed)
To ct

## 2016-07-31 NOTE — ED Notes (Signed)
Checked CBG 303, RN Chris informed

## 2016-07-31 NOTE — ED Notes (Signed)
The pt has continued to void in the bed

## 2016-07-31 NOTE — H&P (Signed)
History and Physical    [name]:[id number] DOB: [birth year] DOA: 07/30/2016  Referring MD/NP/PA:   PCP: Mike SharpsPlacey, [name], NP   Patient coming from:  The patient is coming from home.  At baseline, pt is independent for most of ADL.  Chief Complaint: Altered mental status  HPI: Mike [name] is a 56 y.o. male with medical history significant of homeless, hypertension, hyperlipidemia, diabetes mellitus, depression, anxiety, seizure, migraine headache, tobacco abuse, CKD-2, who presents with altered mental status.  Per report, pt was found laying down in front of [employer]. Pt is confused. He is a very poor and evasive historian, but is complaining of generalized weakness. He had been sweating. When I saw pt in ED, he is very drowsy and confused, not oriented x 3. He denies headache, chest pain, nausea, vomiting or diarreha.  He denies fever or chills. On initial questioning, he states that he had been compliant with his insulin and his antihypertensive medications. However, on more persistent questioning, he thinks that maybe he might have skipped some doses. He moves all extremities normally, no facial droop or slurred speech. He seems very poor hygiene.  ED Course: pt was found to have DKA with anion gap 27, blood sugar 536, bicarbonate 9, WBC 9.7, negative UDS, negative urinalysis, worsening renal function, temperature 99.3, tachycardia, tachypnea, O2 sat 99% on room air, blood pressure is elevated at 224/101. Patient is admitted to stepdown as inpatient.  Review of Systems:   General: no fevers, chills, has poor appetite, has fatigue HEENT: no blurry vision, hearing changes or sore throat Respiratory: no dyspnea, coughing, wheezing CV: no chest pain, no palpitations GI: no nausea, vomiting, abdominal pain, diarrhea, constipation GU: no dysuria, burning on urination, increased urinary frequency, hematuria  Ext: no leg edema Neuro: no unilateral weakness, numbness,  or tingling, no vision change or hearing loss. Has AMS. Skin: no rash, no skin tear. MSK: No muscle spasm, no deformity, no limitation of range of movement in spin Heme: No easy bruising.  Travel history: No recent long distant travel.  Allergy:  Allergies  Allergen Reactions  . Ibuprofen Nausea And Vomiting and Other (See Comments)    Reaction:  Bloating   . Tylenol [Acetaminophen] Nausea And Vomiting and Other (See Comments)    Reaction:  Bloating     Past Medical History:  Diagnosis Date  . Anxiety   . Anxiety   . Chronic lower back pain   . Depression   . Hyperlipemia   . Hypertension   . Migraine    "last one was ~ 4 yr ago" (12/23/2014)  . Seizures (HCC)    "related to pills for anxiety; if I don't take the pills I'm suppose to take I'll have them" (12/23/2014)  . Type II diabetes mellitus (HCC)     Past Surgical History:  Procedure Laterality Date  . high cholesterol    . NO PAST SURGERIES      Social History:  reports that he has been smoking Cigarettes.  He has a 3.20 pack-year smoking history. He has never used smokeless tobacco. He reports that he drinks about 1.2 oz of alcohol per week . He reports that he does not use drugs.  Family History:  Family History  Problem Relation Age of Onset  . Diabetes Mellitus II Mother   . Diabetes Mellitus II Father      Prior to Admission medications   Medication Sig Start Date End Date Taking? Authorizing Provider  hydrALAZINE (APRESOLINE) 50 MG  tablet Take 1 tablet (50 mg total) by mouth 3 (three) times daily. 07/28/16 07/28/17  Linwood Dibbles, MD  insulin aspart (NOVOLOG) 100 UNIT/ML injection Inject 3 Units into the skin 3 (three) times daily with meals. 07/28/16   Linwood Dibbles, MD  insulin NPH Human (NOVOLIN N) 100 UNIT/ML injection 12 units SQ BID 07/28/16 07/28/17  Linwood Dibbles, MD  Insulin Syringe 27G X 1/2" 0.5 ML MISC 1 Syringe by Does not apply route 5 (five) times daily. Patient not taking: Reported on 07/28/2016 06/28/16    Lonia Blood, MD  isosorbide dinitrate (ISORDIL) 20 MG tablet Take 1.5 tablets (30 mg total) by mouth 3 (three) times daily. Patient not taking: Reported on 07/28/2016 06/28/16   Lonia Blood, MD  metFORMIN (GLUCOPHAGE) 500 MG tablet Take 1 tablet (500 mg total) by mouth 2 (two) times daily with a meal. 07/28/16   Linwood Dibbles, MD  metoprolol tartrate (LOPRESSOR) 25 MG tablet Take 1 tablet (25 mg total) by mouth 2 (two) times daily. 07/28/16   Linwood Dibbles, MD    Physical Exam: Vitals:   07/31/16 0130 07/31/16 0145 07/31/16 0215 07/31/16 0407  BP: (!) 205/100 (!) 201/102 (!) 215/116 (!) 206/130  Pulse: 97 100 83   Resp:      Temp:      TempSrc:      SpO2: 100% 100% 100%    General: Not in acute distress. Dry mucus and membrane. Very poor hygiene. HEENT:       Eyes: PERRL, EOMI, no scleral icterus.       ENT: No discharge from the ears and nose, no pharynx injection, no tonsillar enlargement.        Neck: No JVD, no bruit, no mass felt. Heme: No neck lymph node enlargement. Cardiac: S1/S2, RRR, No murmurs, No gallops or rubs. Respiratory:  No rales, wheezing, rhonchi or rubs. GI: Soft, nondistended, nontender, no rebound pain, no organomegaly, BS present. GU: No hematuria Ext: No pitting leg edema bilaterally. 2+DP/PT pulse bilaterally. Musculoskeletal: No joint deformities, No joint redness or warmth, no limitation of ROM in spin. Skin: No rashes.  Neuro: confused, not oriented X3, cranial nerves II-XII grossly intact, moves all extremities normally.  Psych: Patient is not psychotic, no suicidal or hemocidal ideation.  Labs on Admission: I have personally reviewed following labs and imaging studies  CBC:  Recent Labs Lab 07/28/16 1455 07/28/16 1515 07/28/16 1734 07/31/16 0017  WBC 6.4  --   --  9.7  NEUTROABS 4.3  --   --   --   HGB 12.5* 14.6 13.3 12.8*  HCT 40.0 43.0 39.0 40.2  MCV 79.2  --   --  80.4  PLT 286  --   --  351   Basic Metabolic Panel:  Recent  Labs Lab 07/28/16 1455 07/28/16 1515 07/28/16 1734 07/31/16 0017 07/31/16 0255  NA 137 136 137 134*  --   K 4.2 4.3 4.4 4.9  --   CL 92* 94* 96* 98*  --   CO2 24  --   --  9*  --   GLUCOSE 437* 448* 383* 536*  --   BUN 31* 35* 37* 52*  --   CREATININE 1.48* 1.10 1.00 2.31*  --   CALCIUM 9.5  --   --  9.2  --   MG  --   --   --   --  2.5*  PHOS  --   --   --   --  3.0  GFR: CrCl cannot be calculated (Unknown ideal weight.). Liver Function Tests: No results for input(s): AST, ALT, ALKPHOS, BILITOT, PROT, ALBUMIN in the last 168 hours. No results for input(s): LIPASE, AMYLASE in the last 168 hours. No results for input(s): AMMONIA in the last 168 hours. Coagulation Profile: No results for input(s): INR, PROTIME in the last 168 hours. Cardiac Enzymes: No results for input(s): CKTOTAL, CKMB, CKMBINDEX, TROPONINI in the last 168 hours. BNP (last 3 results) No results for input(s): PROBNP in the last 8760 hours. HbA1C: No results for input(s): HGBA1C in the last 72 hours. CBG:  Recent Labs Lab 07/28/16 1623 07/28/16 1724 07/30/16 2257 07/31/16 0150 07/31/16 0322  GLUCAP 391* 362* 544* 450* 349*   Lipid Profile: No results for input(s): CHOL, HDL, LDLCALC, TRIG, CHOLHDL, LDLDIRECT in the last 72 hours. Thyroid Function Tests: No results for input(s): TSH, T4TOTAL, FREET4, T3FREE, THYROIDAB in the last 72 hours. Anemia Panel: No results for input(s): VITAMINB12, FOLATE, FERRITIN, TIBC, IRON, RETICCTPCT in the last 72 hours. Urine analysis:    Component Value Date/Time   COLORURINE STRAW (A) 07/31/2016 0030   APPEARANCEUR CLEAR 07/31/2016 0030   LABSPEC 1.020 07/31/2016 0030   PHURINE 5.0 07/31/2016 0030   GLUCOSEU >=500 (A) 07/31/2016 0030   HGBUR SMALL (A) 07/31/2016 0030   BILIRUBINUR NEGATIVE 07/31/2016 0030   KETONESUR 80 (A) 07/31/2016 0030   PROTEINUR 30 (A) 07/31/2016 0030   UROBILINOGEN 1.0 02/07/2011 1605   NITRITE NEGATIVE 07/31/2016 0030    LEUKOCYTESUR NEGATIVE 07/31/2016 0030   Sepsis Labs: @LABRCNTIP (procalcitonin:4,lacticidven:4) )No results found for this or any previous visit (from the past 240 hour(s)).   Radiological Exams on Admission: No results found.   EKG:  Not done in ED, will get one.   Assessment/Plan Principal Problem:   DKA (diabetic ketoacidoses) (HCC) Active Problems:   Hypertensive urgency   Tobacco abuse   Diabetes mellitus with complication (HCC)   Acute renal failure superimposed on stage 2 chronic kidney disease (HCC)   Homeless   Acute metabolic encephalopathy   DKA (diabetic ketoacidoses) (HCC): Likely due to medication noncompliance. DKA with anion gap 27, blood sugar 536, bicarbonate 9. - Admit to stepdown as inpt - Received 2L of NS bolus in ED - start DKA protocol with BMP q4h - IVF: NS 150 cc/h; will switch to D5-1/2NS when CBG<250 - replete K as needed - Zofran prn nausea  - NPO   Homeless issues - SW and case manager  Acute metabolic encephalopathy: likely due to DAK. no focal neurologic findings. -Frequent neuro check -get CT head -check UDS  Hypertensive urgency: Blood pressure 224/101--> 260/130. That she due to medication noncompliance. -Hydralazine 50 mg 3 times a day, metoprolol -IV hydralazine when necessary   Tobacco abuse: -Nicotine patch  DM-II: Last A1c 10.4 on 06/24/16, poorly controled. Patient is on NPH at home. Now has DAK -on DAK protocol now  AoCKD-II: Baseline Cre is 1.1-1.2, pt's Cre is 2.31on admission. Likely due to prerenal secondary to dehydration - IVF as above - Check FeNa  - Follow up renal function by BMP  DVT ppx: SQ Lovenox Code Status: Full code Family Communication: None at bed side.  Disposition Plan:  Anticipate discharge back to previous home environment Consults called:  none Admission status:   SDU/inpation       Date of Service 07/31/2016    Lorretta Harp [facility] Pager [phone]  If 7PM-7AM, please contact  night-coverage [email or web address] Password [email or web address] 07/31/2016, 4:19 AM

## 2016-07-31 NOTE — Progress Notes (Signed)
Inpatient Diabetes Program Recommendations  AACE/ADA: New Consensus Statement on Inpatient Glycemic Control (2015)  Target Ranges:  Prepandial:   less than 140 mg/dL      Peak postprandial:   less than 180 mg/dL (1-2 hours)      Critically ill patients:  140 - 180 mg/dL   Results for Mike [name], Mike [name] (MRN [id number]) as of 07/31/2016 13:57  Ref. Range 07/30/2016 22:57 07/31/2016 01:50 07/31/2016 03:22 07/31/2016 05:17 07/31/2016 06:29 07/31/2016 07:32 07/31/2016 08:44 07/31/2016 10:06  Glucose-Capillary Latest Ref Range: 65 - 99 mg/dL 782544 (HH) 956450 (H) 213349 (H) 303 (H) 302 (H) 229 (H) 194 (H) 175 (H)   Review of Glycemic Control  Outpatient Diabetes medications: 70/30 20 units BID, Metformin 500 mg BID Current orders for Inpatient glycemic control: Lantus 10 units daily, Novolog 0-9 units TID with meals  Inpatient Diabetes Program Recommendations:  Insulin-Basal: Noted Levemir 10 units ordered today.  Anticipate patient will require more basal insulin than ordered. Please consider increasing Levemir to 9 units BID. Correction (SSI): Please consider ordering Novolog 0-5 units QHS for bedtime correction. Insulin - Meal Coverage: If post prandial glucose is consistently elevated, please consider ordering Novolog 3 units TID with meals for meal coverage if patient eats at least 50% of meals. HgbA1C: Last A1C was 10.4% on 06/24/16 indicating an average glucose of 252 mg/dl.  Thanks, Orlando PennerMarie Sundeep Cary, RN, MSN, CDE Diabetes Coordinator Inpatient Diabetes Program [phone] (Team Pager from 8am to 5pm)

## 2016-07-31 NOTE — ED Notes (Signed)
Checked CBG 450 RN Thayer Ohmhris informed
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
## 2016-07-31 NOTE — ED Notes (Signed)
Report called to rn on 4e 

## 2016-08-01 ENCOUNTER — Encounter (HOSPITAL_COMMUNITY): Payer: Self-pay | Admitting: General Practice

## 2016-08-01 LAB — GLUCOSE, CAPILLARY
GLUCOSE-CAPILLARY: 288 mg/dL — AB (ref 65–99)
GLUCOSE-CAPILLARY: 266 mg/dL — AB (ref 65–99)
GLUCOSE-CAPILLARY: 218 mg/dL — AB (ref 65–99)
Glucose-Capillary: 230 mg/dL — ABNORMAL HIGH (ref 65–99)

## 2016-08-01 MED ORDER — METOPROLOL TARTRATE 50 MG PO TABS
50.0000 mg | ORAL_TABLET | Freq: Two times a day (BID) | ORAL | Status: DC
  Administered 2016-08-01 – 2016-08-06 (×10): 50 mg via ORAL
  Filled 2016-08-01 (×11): qty 1

## 2016-08-01 MED ORDER — INSULIN GLARGINE 100 UNIT/ML ~~LOC~~ SOLN
20.0000 [IU] | Freq: Every day | SUBCUTANEOUS | Status: DC
  Administered 2016-08-02 – 2016-08-03 (×2): 20 [IU] via SUBCUTANEOUS
  Filled 2016-08-01 (×3): qty 0.2

## 2016-08-01 MED ORDER — SODIUM CHLORIDE 0.9 % IV SOLN: INTRAVENOUS | Status: DC

## 2016-08-01 MED ORDER — INSULIN GLARGINE 100 UNIT/ML ~~LOC~~ SOLN
10.0000 [IU] | Freq: Every day | SUBCUTANEOUS | Status: AC
  Administered 2016-08-01: 10 [IU] via SUBCUTANEOUS
  Filled 2016-08-01: qty 0.1

## 2016-08-01 MED ORDER — SODIUM CHLORIDE 0.9 % IV SOLN
INTRAVENOUS | Status: DC
  Administered 2016-08-01 (×2): via INTRAVENOUS

## 2016-08-01 MED ORDER — INSULIN GLARGINE 100 UNIT/ML ~~LOC~~ SOLN: 20.0000 [IU] | Freq: Every day | SUBCUTANEOUS | Status: DC

## 2016-08-01 MED ORDER — CLONIDINE HCL 0.1 MG PO TABS
0.1000 mg | ORAL_TABLET | Freq: Once | ORAL | Status: AC
  Administered 2016-08-01: 0.1 mg via ORAL
  Filled 2016-08-01: qty 1

## 2016-08-01 MED ORDER — ENSURE ENLIVE PO LIQD
237.0000 mL | Freq: Two times a day (BID) | ORAL | Status: DC
  Administered 2016-08-02: 237 mL via ORAL

## 2016-08-01 NOTE — Progress Notes (Signed)
[facility]  PROGRESS NOTE  Mike [name] ZOX:[id number] DOB: 1960-07-24 DOA: 07/30/2016 PCP: [name], [name], NP   Brief HPI:   56 y.o. male with medical history significant of homeless, hypertension, hyperlipidemia, diabetes mellitus, depression, anxiety, seizure, migraine headache, tobacco abuse, CKD-2, who presents with altered mental status. Patient was found  to be in  DKA, with anion gap of 27. DKA resolved, patient is off IV insulin. Mental status has much improved. Patient is homeless and clinical social worker consulted for homeless issues.    Subjective   Patient seen and examined, denies any complaints. No chest pain shortness of breath.   Assessment/Plan:     1. Diabetic ketoacidosis-patient presents with a DKA, anion gap of 27. Currently resolved. Patient is off IV insulin. 2. Diabetes mellitus- blood glucose elevated,  We'll change Lantus to 20 units subcutaneous daily, sliding scale insulin with NovoLog. 3. Acute metabolic encephalopathy - resolved, back to baseline. Ct head : No acute intracranial hemorrhage.Stable global atrophy and white matter hypodensities. If symptoms persist, and there are no contraindications.MRI may provide better evaluation if clinically indicated. 4. Hypertension- blood pressure is elevated, will change metoprolol to 50 mg by mouth twice a day. Continue Hydralazine 50 mg 3 times a day. Patient is also on Imdur 30 mg by mouth 3 times a day. 5. Acute kidney injury- creatinine was 1.99, improved to 1.18 after IV fluids. 6. Homeless issues- CSW consulted, will follow the recommendations.   DVT prophylaxis: Lovenox  Code Status: Full code  Family Communication: No family at bedside   Disposition Plan: Pending social work recommendation for homeless issues   Consultants:  None   Procedures:  None   Continuous infusions . sodium chloride 10 mL/hr at 08/01/16 1738  . sodium chloride Stopped (07/31/16 0736)       Antibiotics:   Anti-infectives    None       Objective   Vitals:   08/01/16 0806 08/01/16 1248 08/01/16 1310 08/01/16 1650  BP: (!) 166/69  (!) 160/113 108/80  Pulse:  82 72 74  Resp:  17 17 14   Temp: 98.4 F (36.9 C) 98.5 F (36.9 C)    TempSrc: Axillary Oral    SpO2:  100% 100% 99%  Weight:      Height:        Intake/Output Summary (Last 24 hours) at 08/01/16 1751 Last data filed at 08/01/16 1600  Gross per 24 hour  Intake              420 ml  Output             1400 ml  Net             -980 ml   Filed Weights   07/31/16 0631 08/01/16 0425  Weight: 59.2 kg (130 lb 9.6 oz) 60.5 kg (133 lb 6.4 oz)     Physical Examination:   Physical Exam: Eyes: No icterus, extraocular muscles intact  Mouth: Oral mucosa is moist, no lesions on palate,  Neck: Supple, no deformities, masses, or tenderness Lungs: Normal respiratory effort, bilateral clear to auscultation, no crackles or wheezes.  Heart: Regular rate and rhythm, S1 and S2 normal, no murmurs, rubs auscultated Abdomen: BS normoactive,soft,nondistended,non-tender to palpation,no organomegaly Extremities: No pretibial edema, no erythema, no cyanosis, no clubbing Neuro : Alert and oriented to time, place and person, No focal deficits       Data Reviewed: I have personally reviewed following labs and imaging studies  CBG:  Recent Labs Lab 07/31/16 1640 07/31/16 2213 08/01/16 0621 08/01/16 1107 08/01/16 1714  GLUCAP 184* 329* 288* 266* 230*    CBC:  Recent Labs Lab 07/28/16 1455 07/28/16 1515 07/28/16 1734 07/31/16 0017  WBC 6.4  --   --  9.7  NEUTROABS 4.3  --   --  8.3*  HGB 12.5* 14.6 13.3 12.8*  HCT 40.0 43.0 39.0 40.2  MCV 79.2  --   --  80.4  PLT 286  --   --  351    Basic Metabolic Panel:  Recent Labs Lab 07/31/16 0017 07/31/16 0255 07/31/16 0630 07/31/16 1050 07/31/16 1413  NA 134* 140 141 143 146*  K 4.9 4.1 4.2 3.7 3.8  CL 98* 107 111 114* 116*  CO2 9* 12* 12*  19* 22  GLUCOSE 536* 375* 293* 126* 117*  BUN 52* 47* 37* 31* 33*  CREATININE 2.31* 1.99* 1.64* 1.20 1.18  CALCIUM 9.2 8.5* 8.8* 9.1 9.0  MG  --  2.5*  --   --   --   PHOS  --  3.0  --   --   --     No results found for this or any previous visit (from the past 240 hour(s)).   Liver Function Tests: No results for input(s): AST, ALT, ALKPHOS, BILITOT, PROT, ALBUMIN in the last 168 hours. No results for input(s): LIPASE, AMYLASE in the last 168 hours. No results for input(s): AMMONIA in the last 168 hours.  Cardiac Enzymes: No results for input(s): CKTOTAL, CKMB, CKMBINDEX, TROPONINI in the last 168 hours. BNP (last 3 results) No results for input(s): BNP in the last 8760 hours.  ProBNP (last 3 results) No results for input(s): PROBNP in the last 8760 hours.    Studies: Ct Head Wo Contrast  Result Date: 07/31/2016 CLINICAL DATA:  56 year old male with history of diabetes and seizure presenting with acute encephalopathy. EXAM: CT HEAD WITHOUT CONTRAST TECHNIQUE: Contiguous axial images were obtained from the base of the skull through the vertex without intravenous contrast. COMPARISON:  Head CT dated 06/24/2016 FINDINGS: Brain: There is mild diffuse volume loss. Moderate periventricular and deep white matter hypodensities as seen on the prior CT may represent chronic microvascular ischemic changes or related to underlying demyelinating disease. Clinical correlation is recommended. There is no acute intracranial hemorrhage. No mass effect or midline shift noted. No extra-axial fluid collection. Vascular: No hyperdense vessel or unexpected calcification. Skull: Normal. Negative for fracture or focal lesion. Sinuses/Orbits: Small right maxillary sinus retention cyst or polyp. The visualized paranasal sinuses and mastoid air cells are otherwise clear. The globes are intact. Other: None IMPRESSION: 1. No acute intracranial hemorrhage. 2. Stable global atrophy and white matter hypodensities as  described. If symptoms persist, and there are no contraindications, MRI may provide better evaluation if clinically indicated. Electronically Signed   By: Elgie Collard M.D.   On: 07/31/2016 05:59    Scheduled Meds: . enoxaparin (LOVENOX) injection  40 mg Subcutaneous Q24H  . hydrALAZINE  50 mg Oral TID  . insulin aspart  0-9 Units Subcutaneous TID WC  . insulin glargine  20 Units Subcutaneous Daily  . isosorbide dinitrate  30 mg Oral TID  . metoprolol tartrate  50 mg Oral BID  . nicotine  21 mg Transdermal Daily      Time spent: 20 min  [facility]   [facility] Pager [phone]. If 7PM-7AM, please contact night-coverage at [email or web address], Office  [phone]  password [email or web address]  08/01/2016, 5:51 PM  LOS:  1 day

## 2016-08-01 NOTE — Progress Notes (Signed)
Inpatient Diabetes Program Recommendations  AACE/ADA: New Consensus Statement on Inpatient Glycemic Control (2015)  Target Ranges:  Prepandial:   less than 140 mg/dL      Peak postprandial:   less than 180 mg/dL (1-2 hours)      Critically ill patients:  140 - 180 mg/dL   Results for Mike [name], Mike [name] (MRN [id number]) as of 08/01/2016 10:04  Ref. Range 07/31/2016 07:32 07/31/2016 08:44 07/31/2016 10:06 07/31/2016 11:26 07/31/2016 12:37 07/31/2016 14:20 07/31/2016 15:24 07/31/2016 16:40 07/31/2016 22:13 08/01/2016 06:21  Glucose-Capillary Latest Ref Range: 65 - 99 mg/dL 528229 (H) 413194 (H) 244175 (H) 126 (H) 119 (H) 136 (H) 118 (H) 184 (H) 329 (H) 288 (H)   Review of Glycemic Control  Outpatient Diabetes medications: 70/30 20 units BID, Metformin 500 mg BID Current orders for Inpatient glycemic control: Lantus 10 units daily, Novolog 0-9 units TID with meals  Inpatient Diabetes Program Recommendations:  Insulin-Basal: Fasting glucose 288 mg/dl today.  Please consider increasing Levemir to 9 units BID. Correction (SSI): Please consider ordering Novolog 0-5 units QHS for bedtime correction. Insulin - Meal Coverage: If post prandial glucose is consistently elevated, please consider ordering Novolog 3 units TID with meals for meal coverage if patient eats at least 50% of meals. HgbA1C:Last A1C was 10.4% on 06/24/16 indicating an average glucose of 252 mg/dl.  Thanks, Orlando PennerMarie Kavin Weckwerth, RN, MSN, CDE Diabetes Coordinator Inpatient Diabetes Program [phone] (Team Pager from 8am to 5pm)

## 2016-08-02 LAB — GLUCOSE, CAPILLARY
GLUCOSE-CAPILLARY: 243 mg/dL — AB (ref 65–99)
GLUCOSE-CAPILLARY: 229 mg/dL — AB (ref 65–99)
Glucose-Capillary: 213 mg/dL — ABNORMAL HIGH (ref 65–99)
Glucose-Capillary: 263 mg/dL — ABNORMAL HIGH (ref 65–99)

## 2016-08-02 LAB — HEMOGLOBIN A1C
Hgb A1c MFr Bld: 12.4 % — ABNORMAL HIGH (ref 4.8–5.6)
MEAN PLASMA GLUCOSE: 309 mg/dL

## 2016-08-02 MED ORDER — GLUCERNA SHAKE PO LIQD
237.0000 mL | Freq: Four times a day (QID) | ORAL | Status: DC
  Administered 2016-08-02 – 2016-08-06 (×12): 237 mL via ORAL

## 2016-08-02 NOTE — Progress Notes (Signed)
PROGRESS NOTE    Mike [name]  ZOX:[id number] DOB: 04-17-1960 DOA: 07/30/2016 PCP: [name], NP   Brief Narrative: 56 y.o.malewith medical history significant of homeless, hypertension, hyperlipidemia, diabetes mellitus, depression, anxiety, seizure, migraine headache, tobacco abuse, who presents with altered mental status. Patient was found  to be in  DKA, with anion gap of 27.  Assessment & Plan:  #Diabetic ketoacidosis without coma: Anion gap closed. Off IV insulin. Monitor blood sugar level. Continue current insulin regimen.  #Diabetes mellitus: Currently on Lantus 20 units and sliding scale. Patient takes NPH at home.  #Acute metabolic encephalopathy likely in the setting of DKA. CT scan of head with no acute finding however consistent with global atrophy. Mental status improved.  #Hypertension: Patient has fluctuation in blood pressure with over 200 blood pressure today. Continue current blood pressure regimen. Monitor blood pressure closely. This morning he was hypotensive. I'll observe blood pressure overnight and possibly discharge home tomorrow.  #Acute kidney injury: Improved now.  #Homelessness issue: Case Production designer, theatre/television/film and social worker consult appreciated.  Principal Problem:   DKA (diabetic ketoacidoses) (HCC) Active Problems:   Hypertensive urgency   Tobacco abuse   Diabetes mellitus with complication (HCC)   Acute renal failure superimposed on stage 2 chronic kidney disease (HCC)   Homeless   Acute metabolic encephalopathy  DVT prophylaxis: Lovenox subcutaneous Code Status: Full code Family Communication: No family at bedside Disposition Plan: Likely discharge tomorrow    Consultants:   None   Procedures: None Antimicrobials: None  Subjective: Seen and examined at bedside. Patient is alert awake and oriented. Denied headache, dizziness, nausea vomiting and chest pain.  Objective: Vitals:   08/02/16 1138 08/02/16 1300 08/02/16 1301 08/02/16  1600  BP: 98/85 (!) 202/84 (!) 199/90 (!) 196/90  Pulse:  78 93 96  Resp:  13 18 18   Temp:   98.9 F (37.2 C) 99.2 F (37.3 C)  TempSrc:   Oral Oral  SpO2:  100% 100% 100%  Weight:      Height:        Intake/Output Summary (Last 24 hours) at 08/02/16 1648 Last data filed at 08/02/16 1615  Gross per 24 hour  Intake              600 ml  Output             2300 ml  Net            -1700 ml   Filed Weights   07/31/16 0631 08/01/16 0425 08/02/16 0400  Weight: 59.2 kg (130 lb 9.6 oz) 60.5 kg (133 lb 6.4 oz) 62.1 kg (136 lb 12.8 oz)    Examination:  General exam: Appears calm and comfortable  Respiratory system: Clear to auscultation. Respiratory effort normal. No wheezing or crackle Cardiovascular system: S1 & S2 heard, RRR.  No pedal edema. Gastrointestinal system: Abdomen is nondistended, soft and nontender. Normal bowel sounds heard. Central nervous system: Alert Awake and following commands. No focal neurological deficits. Extremities: Symmetric 5 x 5 power. Skin: No rashes, lesions or ulcers  Data Reviewed: I have personally reviewed following labs and imaging studies  CBC:  Recent Labs Lab 07/28/16 1455 07/28/16 1515 07/28/16 1734 07/31/16 0017  WBC 6.4  --   --  9.7  NEUTROABS 4.3  --   --  8.3*  HGB 12.5* 14.6 13.3 12.8*  HCT 40.0 43.0 39.0 40.2  MCV 79.2  --   --  80.4  PLT 286  --   --  351   Basic Metabolic Panel:  Recent Labs Lab 07/31/16 0017 07/31/16 0255 07/31/16 0630 07/31/16 1050 07/31/16 1413  NA 134* 140 141 143 146*  K 4.9 4.1 4.2 3.7 3.8  CL 98* 107 111 114* 116*  CO2 9* 12* 12* 19* 22  GLUCOSE 536* 375* 293* 126* 117*  BUN 52* 47* 37* 31* 33*  CREATININE 2.31* 1.99* 1.64* 1.20 1.18  CALCIUM 9.2 8.5* 8.8* 9.1 9.0  MG  --  2.5*  --   --   --   PHOS  --  3.0  --   --   --    GFR: Estimated Creatinine Clearance: 62.9 mL/min (by C-G formula based on SCr of 1.18 mg/dL). Liver Function Tests: No results for input(s): AST, ALT,  ALKPHOS, BILITOT, PROT, ALBUMIN in the last 168 hours. No results for input(s): LIPASE, AMYLASE in the last 168 hours. No results for input(s): AMMONIA in the last 168 hours. Coagulation Profile: No results for input(s): INR, PROTIME in the last 168 hours. Cardiac Enzymes: No results for input(s): CKTOTAL, CKMB, CKMBINDEX, TROPONINI in the last 168 hours. BNP (last 3 results) No results for input(s): PROBNP in the last 8760 hours. HbA1C:  Recent Labs  07/31/16 1413  HGBA1C 12.4*   CBG:  Recent Labs Lab 08/01/16 1714 08/01/16 2054 08/02/16 0609 08/02/16 1131 08/02/16 1628  GLUCAP 230* 218* 243* 213* 263*   Lipid Profile: No results for input(s): CHOL, HDL, LDLCALC, TRIG, CHOLHDL, LDLDIRECT in the last 72 hours. Thyroid Function Tests: No results for input(s): TSH, T4TOTAL, FREET4, T3FREE, THYROIDAB in the last 72 hours. Anemia Panel: No results for input(s): VITAMINB12, FOLATE, FERRITIN, TIBC, IRON, RETICCTPCT in the last 72 hours. Sepsis Labs: No results for input(s): PROCALCITON, LATICACIDVEN in the last 168 hours.  No results found for this or any previous visit (from the past 240 hour(s)).       Radiology Studies: No results found.      Scheduled Meds: . enoxaparin (LOVENOX) injection  40 mg Subcutaneous Q24H  . feeding supplement (GLUCERNA SHAKE)  237 mL Oral QID  . hydrALAZINE  50 mg Oral TID  . insulin aspart  0-9 Units Subcutaneous TID WC  . insulin glargine  20 Units Subcutaneous Daily  . isosorbide dinitrate  30 mg Oral TID  . metoprolol tartrate  50 mg Oral BID  . nicotine  21 mg Transdermal Daily   Continuous Infusions: . sodium chloride 10 mL/hr at 08/01/16 1738  . sodium chloride Stopped (07/31/16 0736)     LOS: 2 days    Philippa Vessey Jaynie CollinsPrasad Delcia Spitzley, MD [facility] Pager [phone]  If 7PM-7AM, please contact night-coverage [email or web address] Password [email or web address] 08/02/2016, 4:48 PM

## 2016-08-02 NOTE — Clinical Social Work Note (Signed)
CSW provided pt with shelter list. Clinical Social Worker will sign off for now as social work intervention is no longer needed. Please consult us again if new need arises.   LimestoneBridget Amrie Gurganus, ConnecticutLCSWA [phone]

## 2016-08-02 NOTE — Progress Notes (Signed)
Pt states he is not eating much because his stomach hurts. He states his chest hurts also.  I asked if this is new. Pt states for about 4 days. Pt feels it is acid indigestion. Pt resting with call bell within reach.  Will continue to monitor. [name], [name], RN

## 2016-08-02 NOTE — Progress Notes (Signed)
Initial Nutrition Assessment  DOCUMENTATION CODES:   Severe malnutrition in context of chronic illness, Underweight  INTERVENTION:    Glucerna Shake po QID, each supplement provides 220 kcal and 10 grams of protein  NUTRITION DIAGNOSIS:   Malnutrition (severe) related to chronic illness (Diabetes) as evidenced by severe depletion of body fat, severe depletion of muscle mass, percent weight loss.  GOAL:   Patient will meet greater than or equal to 90% of their needs  MONITOR:   PO intake, Supplement acceptance, I & O's, Labs  REASON FOR ASSESSMENT:   Malnutrition Screening Tool    ASSESSMENT:   56 yo male with PMH of homelessness, depression, anxiety, HTN, HLD, DM-2, seizures who was admitted on 7/15 with AMS related to DKA.   Patient was not very conversant during RD visit. He c/o stomach pain and decreased appetite.  Nutrition-Focused physical exam completed. Findings are severe fat depletion, severe muscle depletion, and no edema.  18% weight loss within the past 3 months is significant. Patient meets criteria for severe PCM in the context of chronic illness.  Labs and medications reviewed. CBG's: 243-213 A1C 12.4 (H)  Diet Order:  Diet Carb Modified Fluid consistency: Thin; Room service appropriate? Yes  PO's: 0-50%  Skin:  Reviewed, no issues  Last BM:  7/17  Height:   Ht Readings from Last 1 Encounters:  07/31/16 6\' 4"  (1.93 m)    Weight:   Wt Readings from Last 1 Encounters:  08/02/16 136 lb 12.8 oz (62.1 kg)    Ideal Body Weight:  91.8 kg  BMI:  Body mass index is 16.65 kg/m.  Estimated Nutritional Needs:   Kcal:  2100-2300  Protein:  105-120 gm  Fluid:  2.1-2.3 L  EDUCATION NEEDS:   No education needs identified at this time  Joaquin CourtsKimberly Harris, RD, LDN, CNSC Pager [phone] After Hours Pager [phone]

## 2016-08-02 NOTE — Progress Notes (Signed)
Inpatient Diabetes Program Recommendations  AACE/ADA: New Consensus Statement on Inpatient Glycemic Control (2015)  Target Ranges:  Prepandial:   less than 140 mg/dL      Peak postprandial:   less than 180 mg/dL (1-2 hours)      Critically ill patients:  140 - 180 mg/dL   Lab Results  Component Value Date   GLUCAP 213 (H) 08/02/2016   HGBA1C 12.4 (H) 07/31/2016    Review of Glycemic Control:  Results for Mike [name], Natanel (MRN [id number]) as of 08/02/2016 14:53  Ref. Range 08/01/2016 11:07 08/01/2016 17:14 08/01/2016 20:54 08/02/2016 06:09 08/02/2016 11:31  Glucose-Capillary Latest Ref Range: 65 - 99 mg/dL 440266 (H) 102230 (H) 725218 (H) 243 (H) 213 (H)   Diabetes history: Diabetes Outpatient Diabetes medications: 70/30 20 units bid Current orders for Inpatient glycemic control:  Novolog sensitive tid with meals, Lantus 20 units daily  Inpatient Diabetes Program Recommendations:    Attempted to talk to patient regarding elevated A1C.  He spoke very little but did confirm that he was only taking insulin once a day.  He says he keeps insulin in the refrigerator but did not explain where refrigerator is located.  May consider d/c of Lantus and restart 70/30 15 units bid while in the hospital.   Thanks, Beryl MeagerJenny Adriann Ballweg, RN, BC-ADM Inpatient Diabetes Coordinator Pager [phone] (8a-5p)

## 2016-08-03 ENCOUNTER — Inpatient Hospital Stay (HOSPITAL_COMMUNITY): Payer: Self-pay

## 2016-08-03 DIAGNOSIS — R410 Disorientation, unspecified: Secondary | ICD-10-CM

## 2016-08-03 DIAGNOSIS — G9341 Metabolic encephalopathy: Secondary | ICD-10-CM

## 2016-08-03 LAB — GLUCOSE, CAPILLARY
GLUCOSE-CAPILLARY: 267 mg/dL — AB (ref 65–99)
GLUCOSE-CAPILLARY: 164 mg/dL — AB (ref 65–99)
Glucose-Capillary: 248 mg/dL — ABNORMAL HIGH (ref 65–99)
Glucose-Capillary: 211 mg/dL — ABNORMAL HIGH (ref 65–99)

## 2016-08-03 MED ORDER — INSULIN ASPART PROT & ASPART (70-30 MIX) 100 UNIT/ML ~~LOC~~ SUSP
20.0000 [IU] | Freq: Two times a day (BID) | SUBCUTANEOUS | Status: DC
  Administered 2016-08-04: 20 [IU] via SUBCUTANEOUS
  Filled 2016-08-03: qty 10

## 2016-08-03 NOTE — Progress Notes (Signed)
Inpatient Diabetes Program Recommendations  AACE/ADA: New Consensus Statement on Inpatient Glycemic Control (2015)  Target Ranges:  Prepandial:   less than 140 mg/dL      Peak postprandial:   less than 180 mg/dL (1-2 hours)      Critically ill patients:  140 - 180 mg/dL   Lab Results  Component Value Date   GLUCAP 248 (H) 08/03/2016   HGBA1C 12.4 (H) 07/31/2016   Diabetes history: Diabetes Outpatient Diabetes medications: 70/30 20 units bid Current orders for Inpatient glycemic control:  Novolog sensitive tid with meals, Lantus 20 units daily  Inpatient Diabetes Program Recommendations:     May consider d/c of Lantus and restart 70/30 15 units bid while in the hospital.  Thanks, Beryl MeagerJenny Jamie Belger, RN, BC-ADM Inpatient Diabetes Coordinator Pager [phone] (8a-5p)

## 2016-08-03 NOTE — Progress Notes (Signed)
PROGRESS NOTE    Mike [name]  ONG:[id number] DOB: 06/23/1960 DOA: 07/30/2016 PCP: [name], NP   Brief Narrative: 56 y.o.malewith medical history significant of homeless, hypertension, hyperlipidemia, diabetes mellitus, depression, anxiety, seizure, migraine headache, tobacco abuse, who presents with altered mental status. Patient was found  to be in  DKA, with anion gap of 27.  Assessment & Plan:  #Diabetic ketoacidosis without coma: Anion gap closed. Off IV insulin. Monitor blood sugar level. Continue current insulin regimen.  #Diabetes mellitus: Currently on Lantus 20 units and sliding scale. Patient takes NPH at home.  #Acute metabolic encephalopathy likely in the setting of DKA. CT scan of head with no acute finding however consistent with global atrophy and white matter hypodensities. Patient is still slow to respond and has looks confused. He is not getting out of bed. I ordered MRI of brain, PT OT eval. Continue supportive care,.  #Hypertension: The new current blood pressure medications. Patient has fluctuation in blood pressure.  #Acute kidney injury: Improved now.  #Homelessness issue: Case Production designer, theatre/television/film and social worker consult appreciated.  Principal Problem:   DKA (diabetic ketoacidoses) (HCC) Active Problems:   Hypertensive urgency   Tobacco abuse   Diabetes mellitus with complication (HCC)   Acute renal failure superimposed on stage 2 chronic kidney disease (HCC)   Homeless   Acute metabolic encephalopathy  DVT prophylaxis: Lovenox subcutaneous Code Status: Full code Family Communication: No family at bedside Disposition Plan: Likely discharge tomorrow    Consultants:   None   Procedures: None Antimicrobials: None  Subjective: Seen and examined at bedside. Patient is alert awake but slow to respond and mildly confused. Denied chest pain or shortness of breath. Objective: Vitals:   08/03/16 0800 08/03/16 0835 08/03/16 0954 08/03/16 1014    BP: (!) 175/78 (!) 164/85    Pulse: 86 93 91 88  Resp: 12 20 16    Temp:  99.3 F (37.4 C)    TempSrc:  Oral    SpO2: 100% 100% 100%   Weight:      Height:        Intake/Output Summary (Last 24 hours) at 08/03/16 1237 Last data filed at 08/03/16 0842  Gross per 24 hour  Intake              240 ml  Output             1500 ml  Net            -1260 ml   Filed Weights   07/31/16 0631 08/01/16 0425 08/02/16 0400  Weight: 59.2 kg (130 lb 9.6 oz) 60.5 kg (133 lb 6.4 oz) 62.1 kg (136 lb 12.8 oz)    Examination:  General exam: Not in distress  Respiratory system: Clear bilateral. Respiratory effort normal. No wheezing or crackle Cardiovascular system: S1 & S2 heard, RRR.  No pedal edema. Gastrointestinal system: Abdomen is nondistended, soft and nontender. Normal bowel sounds heard. Central nervous system: Responding slowly, flat affect, alert awake Extremities: Symmetric 5 x 5 power. Skin: No rashes, lesions or ulcers  Data Reviewed: I have personally reviewed following labs and imaging studies  CBC:  Recent Labs Lab 07/28/16 1455 07/28/16 1515 07/28/16 1734 07/31/16 0017  WBC 6.4  --   --  9.7  NEUTROABS 4.3  --   --  8.3*  HGB 12.5* 14.6 13.3 12.8*  HCT 40.0 43.0 39.0 40.2  MCV 79.2  --   --  80.4  PLT 286  --   --  351   Basic Metabolic Panel:  Recent Labs Lab 07/31/16 0017 07/31/16 0255 07/31/16 0630 07/31/16 1050 07/31/16 1413  NA 134* 140 141 143 146*  K 4.9 4.1 4.2 3.7 3.8  CL 98* 107 111 114* 116*  CO2 9* 12* 12* 19* 22  GLUCOSE 536* 375* 293* 126* 117*  BUN 52* 47* 37* 31* 33*  CREATININE 2.31* 1.99* 1.64* 1.20 1.18  CALCIUM 9.2 8.5* 8.8* 9.1 9.0  MG  --  2.5*  --   --   --   PHOS  --  3.0  --   --   --    GFR: Estimated Creatinine Clearance: 62.9 mL/min (by C-G formula based on SCr of 1.18 mg/dL). Liver Function Tests: No results for input(s): AST, ALT, ALKPHOS, BILITOT, PROT, ALBUMIN in the last 168 hours. No results for input(s): LIPASE,  AMYLASE in the last 168 hours. No results for input(s): AMMONIA in the last 168 hours. Coagulation Profile: No results for input(s): INR, PROTIME in the last 168 hours. Cardiac Enzymes: No results for input(s): CKTOTAL, CKMB, CKMBINDEX, TROPONINI in the last 168 hours. BNP (last 3 results) No results for input(s): PROBNP in the last 8760 hours. HbA1C:  Recent Labs  07/31/16 1413  HGBA1C 12.4*   CBG:  Recent Labs Lab 08/02/16 1131 08/02/16 1628 08/02/16 2106 08/03/16 0610 08/03/16 1106  GLUCAP 213* 263* 229* 267* 248*   Lipid Profile: No results for input(s): CHOL, HDL, LDLCALC, TRIG, CHOLHDL, LDLDIRECT in the last 72 hours. Thyroid Function Tests: No results for input(s): TSH, T4TOTAL, FREET4, T3FREE, THYROIDAB in the last 72 hours. Anemia Panel: No results for input(s): VITAMINB12, FOLATE, FERRITIN, TIBC, IRON, RETICCTPCT in the last 72 hours. Sepsis Labs: No results for input(s): PROCALCITON, LATICACIDVEN in the last 168 hours.  No results found for this or any previous visit (from the past 240 hour(s)).       Radiology Studies: No results found.      Scheduled Meds: . enoxaparin (LOVENOX) injection  40 mg Subcutaneous Q24H  . feeding supplement (GLUCERNA SHAKE)  237 mL Oral QID  . hydrALAZINE  50 mg Oral TID  . insulin aspart  0-9 Units Subcutaneous TID WC  . insulin glargine  20 Units Subcutaneous Daily  . isosorbide dinitrate  30 mg Oral TID  . metoprolol tartrate  50 mg Oral BID  . nicotine  21 mg Transdermal Daily   Continuous Infusions: . sodium chloride 10 mL/hr at 08/01/16 1738  . sodium chloride Stopped (07/31/16 0736)     LOS: 3 days    Yulitza Shorts Jaynie CollinsPrasad Veida Spira, MD [facility] Pager [phone]  If 7PM-7AM, please contact night-coverage [email or web address] Password [email or web address] 08/03/2016, 12:37 PM

## 2016-08-03 NOTE — Care Management Note (Signed)
Case Management Note Previous CM note initiated by Gala LewandowskyGraves-Bigelow, Brenda Kaye, RN--07/31/2016, 3:10 PM    Patient Details  Name: Mike [name] MRN: [id number] Date of Birth: 20-Nov-1960  Subjective/Objective: Pt presented for AMS- found to be in DKA (blood sugar 536) on admission. Initiated on Glucose Stabilizer. Diabetes Coordinator is following. Outpatient Diabetes Medications:  70/30 20 units BID, Metformin 500 mg BID. [facility] as his PCP- Mike [name] is the NP that follows him. [facility] be able [facility]. Pt is without insurance and is homeless. CSW is following for shelters/ housing resources.                    Action/Plan: CM will continue to monitor for additional needs.   Expected Discharge Date:                  Expected Discharge Plan:  Home/Self Care  In-House Referral:  Clinical Social Work (Question Homeless- CSW aware to consult. )  Discharge planning Services  CM Consult, Medication Assistance, Indigent Health Clinic  Post Acute Care Choice:  NA Choice offered to:  NA  DME Arranged:    DME Agency:     HH Arranged:    HH Agency:     Status of Service:  Completed, signed off  If discussed at [employer] Length of Stay Meetings, dates discussed:    Discharge Disposition:   Additional Comments:  08/02/16- Mike PieriniKristi Angelos Wasco RN, CM- was informed by rounding team pt medically stable for d/c- [name] to inform of pt's pending discharge- [facility] well [facility]- pt [facility] how [facility] f[facility] and go there for assistance with any medication needs.   Zenda AlpersWebster, JenningsKristi Hall, RN 08/03/2016, 11:18 AM [phone]

## 2016-08-04 ENCOUNTER — Inpatient Hospital Stay (HOSPITAL_COMMUNITY): Payer: Self-pay

## 2016-08-04 DIAGNOSIS — I639 Cerebral infarction, unspecified: Secondary | ICD-10-CM

## 2016-08-04 DIAGNOSIS — G9340 Encephalopathy, unspecified: Secondary | ICD-10-CM

## 2016-08-04 LAB — GLUCOSE, CAPILLARY
GLUCOSE-CAPILLARY: 274 mg/dL — AB (ref 65–99)
GLUCOSE-CAPILLARY: 325 mg/dL — AB (ref 65–99)
GLUCOSE-CAPILLARY: 280 mg/dL — AB (ref 65–99)
Glucose-Capillary: 348 mg/dL — ABNORMAL HIGH (ref 65–99)

## 2016-08-04 MED ORDER — STROKE: EARLY STAGES OF RECOVERY BOOK
Freq: Once | Status: AC
  Administered 2016-08-04: 14:00:00
  Filled 2016-08-04 (×2): qty 1

## 2016-08-04 MED ORDER — INSULIN ASPART PROT & ASPART (70-30 MIX) 100 UNIT/ML ~~LOC~~ SUSP
24.0000 [IU] | Freq: Two times a day (BID) | SUBCUTANEOUS | Status: DC
  Administered 2016-08-04 – 2016-08-06 (×4): 24 [IU] via SUBCUTANEOUS
  Filled 2016-08-04 (×2): qty 10

## 2016-08-04 NOTE — Plan of Care (Signed)
Problem: Safety: Goal: Ability to remain free from injury will improve Outcome: Progressing Pt will be free from falls and injuries during this hospitalization.  Problem: Education: Goal: Knowledge of disease or condition will improve Outcome: Progressing Pt education on prevention of dka will be improved prior to discharge.

## 2016-08-04 NOTE — Evaluation (Signed)
Physical Therapy Evaluation Patient Details Name: Mike [name] MRN: [id number] DOB: [birth year] Today's Date: 08/04/2016   History of Present Illness  Pt is a 56 yo male brought to ED with altered mental status secondary to diabetic ketoacidosis. Prior medical history significant of homeless, hypertension, hyperlipidemia, diabetes mellitus, depression, anxiety, seizure, migraine headache, tobacco abuse, CKD-2, who presents with altered mental status.  Clinical Impression  Pt admitted with above diagnosis. Pt currently with functional limitations due to the deficits listed below (see PT Problem List). Pt is currently, mod I for bed mobility, min guard for transfers and minA for ambulation of 80 feet with RW. Pt will benefit from skilled PT to increase their independence and safety with mobility to allow discharge to the venue listed below.       Follow Up Recommendations No PT follow up    Equipment Recommendations  None recommended by PT    Recommendations for Other Services       Precautions / Restrictions Restrictions Weight Bearing Restrictions: No      Mobility  Bed Mobility Overal bed mobility: Modified Independent                Transfers Overall transfer level: Needs assistance   Transfers: Sit to/from Stand Sit to Stand: Min guard         General transfer comment: min guard for safety, pt requires increased time to upright and steadying  Ambulation/Gait Ambulation/Gait assistance: Min assist Ambulation Distance (Feet): 80 Feet Assistive device: Rolling walker (2 wheeled) Gait Pattern/deviations: Step-through pattern;Decreased stride length;Trunk flexed;Drifts right/left Gait velocity: slowed Gait velocity interpretation: Below normal speed for age/gender General Gait Details: minA for steadying and safety, pt initially refused RW but was unsteady in first couple steps, RW used for remainder of walk, vc for sequencing, staying inside walker, upright  posture and hand placement. Pt asked if he felt more secure in RW and he said no, also asked if he would use walker if it were provided and he said no.        Balance Overall balance assessment: Needs assistance Sitting-balance support: No upper extremity supported;Feet supported Sitting balance-Leahy Scale: Good     Standing balance support: No upper extremity supported Standing balance-Leahy Scale: Fair                               Pertinent Vitals/Pain Pain Assessment: No/denies pain    Home Living Family/patient expects to be discharged to:: Shelter/Homeless                      Prior Function Level of Independence: Independent               Hand Dominance   Dominant Hand: Right    Extremity/Trunk Assessment   Upper Extremity Assessment Upper Extremity Assessment: Generalized weakness    Lower Extremity Assessment Lower Extremity Assessment: RLE deficits/detail;LLE deficits/detail RLE Deficits / Details: ROM WFL, strength grossly 3+/5 RLE Sensation: decreased light touch (limited pt response to exam ) LLE Deficits / Details: ROM WFL, strength grossly 3+/5 LLE Sensation: decreased light touch (limited pt response to exam )    Cervical / Trunk Assessment Cervical / Trunk Assessment: Kyphotic  Communication   Communication: No difficulties;Other (comment) (limited conversation responding in 1-2 words or not at alll)  Cognition Arousal/Alertness: Awake/alert Behavior During Therapy: WFL for tasks assessed/performed;Flat affect Overall Cognitive Status: Within Functional Limits for tasks assessed  General Comments General comments (skin integrity, edema, etc.): at rest BP 148/87, HR 109, SaO2 on RA 100% O2, during ambulation HR reached max of 125 bpm, after ambulation BP 143/78, HR 100 bpm, SaO2 on RA 98%O2        Assessment/Plan    PT Assessment Patient needs continued PT  services  PT Problem List Decreased strength;Decreased activity tolerance;Decreased balance;Decreased safety awareness       PT Treatment Interventions DME instruction;Gait training;Functional mobility training;Therapeutic activities;Therapeutic exercise;Balance training;Patient/family education    PT Goals (Current goals can be found in the Care Plan section)  Acute Rehab PT Goals Patient Stated Goal: get out of here PT Goal Formulation: With patient Time For Goal Achievement: 08/11/16 Potential to Achieve Goals: Good    Frequency Min 3X/week    AM-PAC PT "6 Clicks" Daily Activity  Outcome Measure Difficulty turning over in bed (including adjusting bedclothes, sheets and blankets)?: None Difficulty moving from lying on back to sitting on the side of the bed? : None Difficulty sitting down on and standing up from a chair with arms (e.g., wheelchair, bedside commode, etc,.)?: None Help needed moving to and from a bed to chair (including a wheelchair)?: None Help needed walking in hospital room?: A Little Help needed climbing 3-5 steps with a railing? : A Lot 6 Click Score: 21    End of Session Equipment Utilized During Treatment: Gait belt Activity Tolerance: Patient tolerated treatment well Patient left: in chair;with call bell/phone within reach;with chair alarm set Nurse Communication: Mobility status PT Visit Diagnosis: Other abnormalities of gait and mobility (R26.89);Muscle weakness (generalized) (M62.81);Unsteadiness on feet (R26.81);Difficulty in walking, not elsewhere classified (R26.2)    Time: 4098-1191 PT Time Calculation (min) (ACUTE ONLY): 33 min   Charges:   PT Evaluation $PT Eval Moderate Complexity: 1 Procedure PT Treatments $Gait Training: 8-22 mins   PT G Codes:        Rennee Coyne B. Beverely Risen PT, DPT Acute Rehabilitation  [phone] Pager [phone]    Elon Alas Fleet 08/04/2016, 9:20 AM

## 2016-08-04 NOTE — Progress Notes (Signed)
PROGRESS NOTE    Mike [name]  ZOX:[id number] DOB: 1960-04-05 DOA: 07/30/2016 PCP: [name], NP   Brief Narrative: 56 y.o.malewith medical history significant of homeless, hypertension, hyperlipidemia, diabetes mellitus, depression, anxiety, seizure, migraine headache, tobacco abuse, who presents with altered mental status. Patient was found  to be in  DKA, with anion gap of 27.  Assessment & Plan:  #Diabetic ketoacidosis without coma: Anion gap closed. Off IV insulin.   #Diabetes mellitus with hyperglycemia: Blood sugar level elevated therefore increased the dose of NPH to 24 units twice a day. Monitor blood sugar level closely. On carb modified by diet. Patient has A1c of 12.4.  #Acute metabolic encephalopathy likely in the setting of DKA and chronic microvascular ischemic changes .  -CT scan of head with no acute finding however consistent with global atrophy and white matter hypodensities.  -MRI of the brain showed multiple punctuate foci of hyperintensity consistent with multiple microvascular infarcts of varying ages. I reviewed abnormal finding with the neurologist Dr. Amada Jupiter. He recommended further evaluation including echocardiogram, carotid Doppler, repeat as a part of the stroke evaluation. Neurologist will evaluate the patient. Continue PT OT evaluation and supportive care. Neurology consult appreciated. His mental status was better this morning. He was alert awake and oriented to hospital his date of birth and 2017.  #Hypertension: Patient has fluctuation in blood pressure. Continue current blood pressure medication.  #Acute kidney injury: Improved now.  #Homelessness issue: Case Production designer, theatre/television/film and social worker consult appreciated.  Principal Problem:   DKA (diabetic ketoacidoses) (HCC) Active Problems:   Hypertensive urgency   Tobacco abuse   Diabetes mellitus with complication (HCC)   Acute renal failure superimposed on stage 2 chronic kidney disease  (HCC)   Homeless   Acute metabolic encephalopathy   Confusion  DVT prophylaxis: Lovenox subcutaneous Code Status: Full code Family Communication: No family at bedside Disposition Plan: Likely discharge in 1-2 days    Consultants:   None   Procedures: None Antimicrobials: None  Subjective: Seen and examined at bedside. He was sitting on chair comfortably. Denied headache, dizziness, nausea, vomiting, chest pain or shortness of breath.  Objective: Vitals:   08/03/16 2000 08/04/16 0035 08/04/16 0037 08/04/16 0355  BP: (!) 169/67 (!) 157/73  122/60  Pulse: 89 72 84 76  Resp: 15     Temp: 98.6 F (37 C)  98.7 F (37.1 C) 99.2 F (37.3 C)  TempSrc: Oral  Oral Oral  SpO2: 95% 100%  100%  Weight:      Height:        Intake/Output Summary (Last 24 hours) at 08/04/16 1158 Last data filed at 08/04/16 0934  Gross per 24 hour  Intake                0 ml  Output             1075 ml  Net            -1075 ml   Filed Weights   07/31/16 0631 08/01/16 0425 08/02/16 0400  Weight: 59.2 kg (130 lb 9.6 oz) 60.5 kg (133 lb 6.4 oz) 62.1 kg (136 lb 12.8 oz)    Examination:  General exam: Not in distress Respiratory system: Clear bilateral, respiratory effort normal. No wheezing or crackle Cardiovascular system: Regular rate rhythm, S1-S2 normal. No pedal edema. Gastrointestinal system: Abdomen is nondistended, soft and nontender. Normal bowel sounds heard. Central nervous system: Alert awake and oriented to hospital, 2018. Flat affect. Extremities: Symmetric 5 x  5 power. Skin: No rashes, lesions or ulcers  Data Reviewed: I have personally reviewed following labs and imaging studies  CBC:  Recent Labs Lab 07/28/16 1455 07/28/16 1515 07/28/16 1734 07/31/16 0017  WBC 6.4  --   --  9.7  NEUTROABS 4.3  --   --  8.3*  HGB 12.5* 14.6 13.3 12.8*  HCT 40.0 43.0 39.0 40.2  MCV 79.2  --   --  80.4  PLT 286  --   --  351   Basic Metabolic Panel:  Recent Labs Lab  07/31/16 0017 07/31/16 0255 07/31/16 0630 07/31/16 1050 07/31/16 1413  NA 134* 140 141 143 146*  K 4.9 4.1 4.2 3.7 3.8  CL 98* 107 111 114* 116*  CO2 9* 12* 12* 19* 22  GLUCOSE 536* 375* 293* 126* 117*  BUN 52* 47* 37* 31* 33*  CREATININE 2.31* 1.99* 1.64* 1.20 1.18  CALCIUM 9.2 8.5* 8.8* 9.1 9.0  MG  --  2.5*  --   --   --   PHOS  --  3.0  --   --   --    GFR: Estimated Creatinine Clearance: 62.9 mL/min (by C-G formula based on SCr of 1.18 mg/dL). Liver Function Tests: No results for input(s): AST, ALT, ALKPHOS, BILITOT, PROT, ALBUMIN in the last 168 hours. No results for input(s): LIPASE, AMYLASE in the last 168 hours. No results for input(s): AMMONIA in the last 168 hours. Coagulation Profile: No results for input(s): INR, PROTIME in the last 168 hours. Cardiac Enzymes: No results for input(s): CKTOTAL, CKMB, CKMBINDEX, TROPONINI in the last 168 hours. BNP (last 3 results) No results for input(s): PROBNP in the last 8760 hours. HbA1C: No results for input(s): HGBA1C in the last 72 hours. CBG:  Recent Labs Lab 08/03/16 1106 08/03/16 1632 08/03/16 2147 08/04/16 0645 08/04/16 1142  GLUCAP 248* 211* 164* 274* 348*   Lipid Profile: No results for input(s): CHOL, HDL, LDLCALC, TRIG, CHOLHDL, LDLDIRECT in the last 72 hours. Thyroid Function Tests: No results for input(s): TSH, T4TOTAL, FREET4, T3FREE, THYROIDAB in the last 72 hours. Anemia Panel: No results for input(s): VITAMINB12, FOLATE, FERRITIN, TIBC, IRON, RETICCTPCT in the last 72 hours. Sepsis Labs: No results for input(s): PROCALCITON, LATICACIDVEN in the last 168 hours.  No results found for this or any previous visit (from the past 240 hour(s)).       Radiology Studies: [name]  Result Date: 08/03/2016 CLINICAL DATA:  56 y/o  M; altered mental status.  DKA. EXAM: MRI HEAD WITHOUT CONTRAST TECHNIQUE: Multiplanar, multiecho pulse sequences of the brain and surrounding structures were  obtained without intravenous contrast. COMPARISON:  07/31/2016 CT head. FINDINGS: Brain: There multiple punctate foci of diffusion hyperintensity with variable ADC throughout subcortical white matter in the frontal and parietal lobes compatible with areas of acute/ early subacute ischemia. Additionally, there is reduced diffusion throughout the splenium of corpus callosum without mass effect. There is extensive confluent T2 FLAIR hyperintense signal abnormality throughout the supratentorial subcortical and periventricular white matter compatible with severe chronic microvascular ischemic changes. Severe brain parenchymal volume loss. There multiple chronic infarctions in periventricular white matter and chronic lacunar infarcts within bilateral lentiform nuclei in the right caudate body. No intracranial hemorrhage, focal mass effect, extra-axial collection, or hydrocephalus. Vascular: Normal flow voids. Skull and upper cervical spine: Normal marrow signal. Sinuses/Orbits: Right maxillary sinus mucous retention cysts. Trace right mastoid effusion. Normal orbits. Other: None. IMPRESSION: 1. Multiple punctate foci of diffusion hyperintensity with variable  ADC throughout subcortical white matter of the frontal and parietal lobes compatible with multiple microvascular infarcts of varying ages. 2. Mildly reduced diffusion within splenium of corpus callosum without mass effect. Given patient's history this is probably related to metabolic derangement. There is a broad differential, for example ischemia, seizure, drug toxicity (antiepileptic, metronidazole, sympathomimetic.), ETOH encephalopathy, or hypoglycemia. 3. Severe chronic microvascular ischemic changes, numerous small chronic infarcts, and severe parenchymal volume loss. These results will be called to the ordering clinician or representative by the Radiologist Assistant, and communication documented in the PACS or zVision Dashboard. Electronically Signed   By:  Mitzi HansenLance  Furusawa-Stratton M.D.   On: 08/03/2016 22:01        Scheduled Meds: .  stroke: mapping our early stages of recovery book   Does not apply Once  . enoxaparin (LOVENOX) injection  40 mg Subcutaneous Q24H  . feeding supplement (GLUCERNA SHAKE)  237 mL Oral QID  . hydrALAZINE  50 mg Oral TID  . insulin aspart  0-9 Units Subcutaneous TID WC  . insulin aspart protamine- aspart  20 Units Subcutaneous BID WC  . isosorbide dinitrate  30 mg Oral TID  . metoprolol tartrate  50 mg Oral BID  . nicotine  21 mg Transdermal Daily   Continuous Infusions: . sodium chloride Stopped (08/03/16 2052)  . sodium chloride Stopped (07/31/16 0736)     LOS: 4 days    Naureen Benton Jaynie CollinsPrasad Lezlee Gills, MD [facility] Pager [phone]  If 7PM-7AM, please contact night-coverage [email or web address] Password [email or web address] 08/04/2016, 11:58 AM

## 2016-08-04 NOTE — Progress Notes (Signed)
*  PRELIMINARY RESULTS* Vascular Ultrasound Carotid Duplex has been completed.  Preliminary findings: Bilateral: No significant (1-39%) ICA stenosis. Antegrade vertebral flow.   Farrel DemarkJill Eunice, RDMS, RVT  08/04/2016, 3:49 PM

## 2016-08-04 NOTE — Consult Note (Signed)
Requesting Physician: Dr. Ronalee Belts    Chief Complaint: stroke  History obtained from:  Patient    HPI:                                                                                                                                         Mike [name] is an 56 y.o. male with medical history significant of homeless, hypertension, hyperlipidemia, diabetes mellitus, depression, anxiety, seizure, migraine headache, tobacco abuse, CKD-2, who presents with altered mental status.Patient  was found laying down in front of [employer]. Hewas confused and while in ED a very poor and evasive historian, but is complaining of generalized weakness. He has been  been compliant with his insulin and his antihypertensive medications. However, on more persistent questioning while in the ED, he noted that maybe he might have skipped some doses. While in the ED he was found to have DKA with anion gap 27, blood sugar 536, bicarbonate 9, WBC 9.7, negative UDS, negative urinalysis, worsening renal function, temperature 99.3, tachycardia, tachypnea, O2 sat 99% on room air, blood pressure is elevated at 224/101. Further evaluation with MRI showed Multiple punctate foci of diffusion hyperintensity with variable ADC throughout subcortical white matter of the frontal and parietal lobes compatible with multiple microvascular infarcts of varying ages, along with mildly reduced diffusion within splenium of corpus callosum without mass effect. This is probably related to metabolic derangement.   During consultation patient is very nonverbal. Not because he cannot speak or he has a aphasia but due to the fact that he does not want to converse. Initially he did not want to take part in any portion of the exam however as time went on he was more sociable and able to follow commands. Patient did not like answering any questions I was asked thus remain silent. States he is homeless and he lives wherever he wants to live. States that  he does have problems getting his medications. That is much as I could get out of him  Date last known well: Date: 07/30/2016 Time last known well: Unable to determine tPA Given: No: out of window  Past Medical History:  Diagnosis Date  . Anxiety   . Anxiety   . Chronic lower back pain   . Depression   . DKA (diabetic ketoacidoses) (HCC) 07/30/2016  . Hyperlipemia   . Hypertension   . Migraine    "last one was ~ 4 yr ago" (12/23/2014)  . Seizures (HCC)    "related to pills for anxiety; if I don't take the pills I'm suppose to take I'll have them" (12/23/2014)  . Type II diabetes mellitus (HCC)     Past Surgical History:  Procedure Laterality Date  . NO PAST SURGERIES      Family History  Problem Relation Age of Onset  . Diabetes Mellitus II Mother   . Diabetes Mellitus II Father    Social  History:  reports that he has been smoking Cigarettes.  He has a 4.00 pack-year smoking history. He has never used smokeless tobacco. He reports that he drinks about 1.2 oz of alcohol per week . He reports that he does not use drugs.  Allergies:  Allergies  Allergen Reactions  . Ibuprofen Nausea And Vomiting and Other (See Comments)    Reaction:  Bloating   . Tylenol [Acetaminophen] Nausea And Vomiting and Other (See Comments)    Reaction:  Bloating     Medications:                                                                                                                           Prior to Admission:  Prescriptions Prior to Admission  Medication Sig Dispense Refill Last Dose  . hydrALAZINE (APRESOLINE) 50 MG tablet Take 1 tablet (50 mg total) by mouth 3 (three) times daily. 90 tablet 0 Unknown at Unknown  . insulin aspart protamine- aspart (NOVOLOG MIX 70/30) (70-30) 100 UNIT/ML injection Inject 20 Units into the skin 2 (two) times daily with a meal.   Unknown at Unknown  . metFORMIN (GLUCOPHAGE) 500 MG tablet Take 1 tablet (500 mg total) by mouth 2 (two) times daily with a meal.  60 tablet 1 Unknown at Unknown  . metoprolol tartrate (LOPRESSOR) 25 MG tablet Take 1 tablet (25 mg total) by mouth 2 (two) times daily. 60 tablet 0 Unknown at Unknown  . insulin aspart (NOVOLOG) 100 UNIT/ML injection Inject 3 Units into the skin 3 (three) times daily with meals. (Patient not taking: Reported on 07/31/2016) 10 mL 11 Not Taking at Unknown time  . insulin NPH Human (NOVOLIN N) 100 UNIT/ML injection 12 units SQ BID (Patient not taking: Reported on 07/31/2016) 10 mL 0 Not Taking at Unknown time  . Insulin Syringe 27G X 1/2" 0.5 ML MISC 1 Syringe by Does not apply route 5 (five) times daily. (Patient not taking: Reported on 07/31/2016) 150 each 0 Not Taking at Unknown time  . isosorbide dinitrate (ISORDIL) 20 MG tablet Take 1.5 tablets (30 mg total) by mouth 3 (three) times daily. (Patient not taking: Reported on 07/28/2016) 90 tablet 0 Not Taking at Unknown time   Scheduled: .  stroke: mapping our early stages of recovery book   Does not apply Once  . enoxaparin (LOVENOX) injection  40 mg Subcutaneous Q24H  . feeding supplement (GLUCERNA SHAKE)  237 mL Oral QID  . hydrALAZINE  50 mg Oral TID  . insulin aspart  0-9 Units Subcutaneous TID WC  . insulin aspart protamine- aspart  24 Units Subcutaneous BID WC  . isosorbide dinitrate  30 mg Oral TID  . metoprolol tartrate  50 mg Oral BID  . nicotine  21 mg Transdermal Daily    ROS:  History obtained from the patient  General ROS: negative for - chills, fatigue, fever, night sweats, weight gain or weight loss Psychological ROS: negative for - behavioral disorder, hallucinations, memory difficulties, mood swings or suicidal ideation Ophthalmic ROS: negative for - blurry vision, double vision, eye pain or loss of vision ENT ROS: negative for - epistaxis, nasal discharge, oral lesions, sore throat, tinnitus or  vertigo Allergy and Immunology ROS: negative for - hives or itchy/watery eyes Hematological and Lymphatic ROS: negative for - bleeding problems, bruising or swollen lymph nodes Endocrine ROS: negative for - galactorrhea, hair pattern changes, polydipsia/polyuria or temperature intolerance Respiratory ROS: negative for - cough, hemoptysis, shortness of breath or wheezing Cardiovascular ROS: negative for - chest pain, dyspnea on exertion, edema or irregular heartbeat Gastrointestinal ROS: negative for - abdominal pain, diarrhea, hematemesis, nausea/vomiting or stool incontinence Genito-Urinary ROS: negative for - dysuria, hematuria, incontinence or urinary frequency/urgency Musculoskeletal ROS: negative for - joint swelling or muscular weakness Neurological ROS: as noted in HPI Dermatological ROS: negative for rash and skin lesion changes  Neurologic Examination:                                                                                                      Blood pressure 122/60, pulse 76, temperature 99.2 F (37.3 C), temperature source Oral, resp. rate 15, height 6\' 4"  (1.93 m), weight 62.1 kg (136 lb 12.8 oz), SpO2 100 %.  HEENT-  Normocephalic, no lesions, without obvious abnormality.  Normal external eye and conjunctiva.  Normal TM's bilaterally.  Normal auditory canals and external ears. Normal external nose, mucus membranes and septum.  Normal pharynx. Cardiovascular- S1, S2 normal, pulses palpable throughout   Lungs- chest clear, no wheezing, rales, normal symmetric air entry Abdomen- normal findings: bowel sounds normal Extremities- no edema Lymph-no adenopathy palpable Musculoskeletal-no joint tenderness, deformity or swelling Skin-warm and dry, no hyperpigmentation, vitiligo, or suspicious lesions  Neurological Examination Mental Status: Alert, oriented, thought content appropriate.  Speech fluent without evidence of aphasia.  Able to follow 3 step commands without  difficulty. Cranial Nerves: II: Visual fields grossly normal,  III,IV, VI: ptosis  Present left eye, extra-ocular motions intact bilaterally--when looking to the left his right eye did not move completely immediately thus giving him double vision when looking far to the left, pupils equal, round, reactive to light and accommodation V,VII: smile symmetric  with right nasolabial fold decrease, facial light touch sensation normal bilaterally VIII: hearing normal bilaterally IX,X: uvula rises symmetrically XI: bilateral shoulder shrug XII: midline tongue extension Motor: Right : Upper extremity   4/5    Left:     Upper extremity   5/5  Lower extremity   5/5     Lower extremity   5/5 Slight pronator drift with the right arm and orbit did left arm around right arm Tone and bulk:normal tone throughout; no atrophy noted Sensory: Pinprick and light touch intact throughout, bilaterally--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 Deep Tendon Reflexes: 1+ and symmetric throughout Plantars: Right: downgoing   Left: downgoing Cerebellar: normal finger-to-nose,  and normal  heel-to-shin test Gait: Not tested as patient refused       Lab Results: Basic Metabolic Panel:  Recent Labs Lab 07/31/16 0017 07/31/16 0255 07/31/16 0630 07/31/16 1050 07/31/16 1413  NA 134* 140 141 143 146*  K 4.9 4.1 4.2 3.7 3.8  CL 98* 107 111 114* 116*  CO2 9* 12* 12* 19* 22  GLUCOSE 536* 375* 293* 126* 117*  BUN 52* 47* 37* 31* 33*  CREATININE 2.31* 1.99* 1.64* 1.20 1.18  CALCIUM 9.2 8.5* 8.8* 9.1 9.0  MG  --  2.5*  --   --   --   PHOS  --  3.0  --   --   --     Liver Function Tests: No results for input(s): AST, ALT, ALKPHOS, BILITOT, PROT, ALBUMIN in the last 168 hours. No results for input(s): LIPASE, AMYLASE in the last 168 hours. No results for input(s): AMMONIA in the last 168 hours.  CBC:  Recent Labs Lab 07/28/16 1455 07/28/16 1515 07/28/16 1734 07/31/16 0017  WBC 6.4  --   --   9.7  NEUTROABS 4.3  --   --  8.3*  HGB 12.5* 14.6 13.3 12.8*  HCT 40.0 43.0 39.0 40.2  MCV 79.2  --   --  80.4  PLT 286  --   --  351    Cardiac Enzymes: No results for input(s): CKTOTAL, CKMB, CKMBINDEX, TROPONINI in the last 168 hours.  Lipid Panel: No results for input(s): CHOL, TRIG, HDL, CHOLHDL, VLDL, LDLCALC in the last 168 hours.  CBG:  Recent Labs Lab 08/03/16 1106 08/03/16 1632 08/03/16 2147 08/04/16 0645 08/04/16 1142  GLUCAP 248* 211* 164* 274* 348*    Microbiology: Results for orders placed or performed during the hospital encounter of 06/24/16  MRSA PCR Screening     Status: None   Collection Time: 06/24/16  6:18 PM  Result Value Ref Range Status   MRSA by PCR NEGATIVE NEGATIVE Final    Comment:        The GeneXpert MRSA Assay (FDA approved for NASAL specimens only), is one component of a comprehensive MRSA colonization surveillance program. It is not intended to diagnose MRSA infection nor to guide or monitor treatment for MRSA infections.     Coagulation Studies: No results for input(s): LABPROT, INR in the last 72 hours.  Imaging: [name]  Result Date: 08/03/2016 CLINICAL DATA:  56 y/o  M; altered mental status.  DKA. EXAM: MRI HEAD WITHOUT CONTRAST TECHNIQUE: Multiplanar, multiecho pulse sequences of the brain and surrounding structures were obtained without intravenous contrast. COMPARISON:  07/31/2016 CT head. FINDINGS: Brain: There multiple punctate foci of diffusion hyperintensity with variable ADC throughout subcortical white matter in the frontal and parietal lobes compatible with areas of acute/ early subacute ischemia. Additionally, there is reduced diffusion throughout the splenium of corpus callosum without mass effect. There is extensive confluent T2 FLAIR hyperintense signal abnormality throughout the supratentorial subcortical and periventricular white matter compatible with severe chronic microvascular ischemic changes.  Severe brain parenchymal volume loss. There multiple chronic infarctions in periventricular white matter and chronic lacunar infarcts within bilateral lentiform nuclei in the right caudate body. No intracranial hemorrhage, focal mass effect, extra-axial collection, or hydrocephalus. Vascular: Normal flow voids. Skull and upper cervical spine: Normal marrow signal. Sinuses/Orbits: Right maxillary sinus mucous retention cysts. Trace right mastoid effusion. Normal orbits. Other: None. IMPRESSION: 1. Multiple punctate foci of diffusion hyperintensity with variable ADC throughout subcortical white matter of the frontal and parietal  lobes compatible with multiple microvascular infarcts of varying ages. 2. Mildly reduced diffusion within splenium of corpus callosum without mass effect. Given patient's history this is probably related to metabolic derangement. There is a broad differential, for example ischemia, seizure, drug toxicity (antiepileptic, metronidazole, sympathomimetic.), ETOH encephalopathy, or hypoglycemia. 3. Severe chronic microvascular ischemic changes, numerous small chronic infarcts, and severe parenchymal volume loss. These results will be called to the ordering clinician or representative by the Radiologist Assistant, and communication documented in the PACS or zVision Dashboard. Electronically Signed   By: Mitzi Hansen M.D.   On: 08/03/2016 22:01       Assessment and plan discussed with with attending physician and they are in agreement.    Felicie Morn PA-C [facility] [phone]  08/04/2016, 11:58 AM   Assessment: 56 y.o. male presenting to the hospital and DKA with multiple punctate infarcts. I suspect that the corpus callosal change is metabolic in nature, but many of the other areas appear to be more consistent with ischemic infarct. There are reports of DKA causing a transient thrombophilia.  Stroke Risk Factors - diabetes mellitus, hyperlipidemia and  hypertension  Recommendation: 1. HgbA1c, fasting lipid panel 2.  MRA  of the brain without contrast 3. PT consult, OT consult, Speech consult 4. Echocardiogram 5. Carotid dopplers 6. Prophylactic therapy-Antiplatelet med: Aspirin - dose 325 mg daily 7. Risk factor modification 8. Telemetry monitoring 9. Frequent neuro checks 10 NPO until passes stroke swallow screen 11 please page stroke NP  Or  PA  Or MD from 8am -4 pm  as this patient from this time will be  followed by the stroke.   You can look them up on [email or web address]  Password [email or web address]    Ritta Slot, MD [facility] [phone]  If 7pm- 7am, please page neurology on call as listed in AMION.

## 2016-08-05 ENCOUNTER — Inpatient Hospital Stay (HOSPITAL_COMMUNITY): Payer: Self-pay

## 2016-08-05 DIAGNOSIS — G9340 Encephalopathy, unspecified: Secondary | ICD-10-CM

## 2016-08-05 LAB — LIPID PANEL
CHOL/HDL RATIO: 3.4 ratio
CHOLESTEROL: 182 mg/dL (ref 0–200)
HDL: 53 mg/dL (ref >40)
LDL Cholesterol: 119 mg/dL — ABNORMAL HIGH (ref 0–99)
TRIGLYCERIDES: 52 mg/dL (ref <150)
VLDL: 10 mg/dL (ref 0–40)

## 2016-08-05 LAB — ECHOCARDIOGRAM COMPLETE
Height: 76 in
Weight: 2188.8 oz

## 2016-08-05 LAB — GLUCOSE, CAPILLARY
GLUCOSE-CAPILLARY: 231 mg/dL — AB (ref 65–99)
GLUCOSE-CAPILLARY: 281 mg/dL — AB (ref 65–99)
Glucose-Capillary: 179 mg/dL — ABNORMAL HIGH (ref 65–99)
Glucose-Capillary: 285 mg/dL — ABNORMAL HIGH (ref 65–99)

## 2016-08-05 MED ORDER — ASPIRIN 325 MG PO TABS
325.0000 mg | ORAL_TABLET | Freq: Every day | ORAL | Status: DC
  Administered 2016-08-05 – 2016-08-06 (×2): 325 mg via ORAL
  Filled 2016-08-05: qty 1

## 2016-08-05 MED ORDER — ATORVASTATIN CALCIUM 40 MG PO TABS
40.0000 mg | ORAL_TABLET | Freq: Every day | ORAL | Status: DC
  Administered 2016-08-05: 40 mg via ORAL
  Filled 2016-08-05: qty 1

## 2016-08-05 NOTE — Progress Notes (Signed)
PROGRESS NOTE    Mike [name]  ZOX:[id number] DOB: 1960-07-28 DOA: 07/30/2016 PCP: [name], NP   Brief Narrative: 56 y.o.malewith medical history significant of homeless, hypertension, hyperlipidemia, diabetes mellitus, depression, anxiety, seizure, migraine headache, tobacco abuse, who presents with altered mental status. Patient was found  to be in  DKA, with anion gap of 27.  Assessment & Plan:  #Diabetic ketoacidosis without coma: Anion gap closed. Off IV insulin.   #Diabetes mellitus with hyperglycemia: Blood sugar level elevated therefore increased the dose of NPH to 24 units twice a day. Monitor blood sugar level closely. On carb modified by diet. Patient has A1c of 12.4.  #Acute metabolic encephalopathy likely in the setting of DKA and multiple punctate ischemic stroke, likely subacute .  -CT scan of head with no acute finding however consistent with global atrophy and white matter hypodensities.  -MRI of the brain showed multiple punctuate foci of hyperintensity consistent with multiple microvascular infarcts of varying ages. I reviewed abnormal finding with the neurologist Dr. Amada Jupiter.  -stroke order set ordered.  -Carotid Doppler unremarkable, MRA negative. Pending echocardiogram. LDL 119. Started Lipitor. -started ASA -PT OT therapy -Neurology consult appreciated. Mental status improving.  #Hypertension: Patient has fluctuation in blood pressure. Continue current blood pressure medication.  #Acute kidney injury: Improved now.  #Homelessness issue: Case Production designer, theatre/television/film and social worker consult appreciated.  Principal Problem:   DKA (diabetic ketoacidoses) (HCC) Active Problems:   Hypertensive urgency   Tobacco abuse   Diabetes mellitus with complication (HCC)   Acute renal failure superimposed on stage 2 chronic kidney disease (HCC)   Homeless   Acute metabolic encephalopathy   Confusion  DVT prophylaxis: Lovenox subcutaneous Code Status: Full  code Family Communication: No family at bedside Disposition Plan: Likely discharge in 1-2 days    Consultants:  Neurology  Procedures: MRI, MRA brain, carotid Doppler, echocardiogram Antimicrobials: None  Subjective: Seen and examined at bedside. Denied nausea vomiting headache dizziness chest pain or shortness of breath. Objective: Vitals:   08/04/16 2258 08/05/16 0000 08/05/16 0400 08/05/16 0800  BP: (!) 164/93 115/81 137/64 (!) 155/69  Pulse: 96 75 77 75  Resp: 20 14 16 16   Temp:   98.6 F (37 C) 98.8 F (37.1 C)  TempSrc:   Oral Oral  SpO2: 100% 100% 100% 100%  Weight:      Height:        Intake/Output Summary (Last 24 hours) at 08/05/16 1158 Last data filed at 08/05/16 0315  Gross per 24 hour  Intake           678.67 ml  Output              400 ml  Net           278.67 ml   Filed Weights   07/31/16 0631 08/01/16 0425 08/02/16 0400  Weight: 59.2 kg (130 lb 9.6 oz) 60.5 kg (133 lb 6.4 oz) 62.1 kg (136 lb 12.8 oz)    Examination:  General exam: Not in distress Respiratory system: Clear bilateral, respiratory effort normal. No wheezing or crackle Cardiovascular system: Regular rate rhythm S1 is normal. No pedal edema Gastrointestinal system: Abdomen is nondistended, soft and nontender. Normal bowel sounds heard. Central nervous system: Alert awake and following commands Skin: No rashes, lesions or ulcers  Data Reviewed: I have personally reviewed following labs and imaging studies  CBC:  Recent Labs Lab 07/31/16 0017  WBC 9.7  NEUTROABS 8.3*  HGB 12.8*  HCT 40.2  MCV 80.4  PLT 351   Basic Metabolic Panel:  Recent Labs Lab 07/31/16 0017 07/31/16 0255 07/31/16 0630 07/31/16 1050 07/31/16 1413  NA 134* 140 141 143 146*  K 4.9 4.1 4.2 3.7 3.8  CL 98* 107 111 114* 116*  CO2 9* 12* 12* 19* 22  GLUCOSE 536* 375* 293* 126* 117*  BUN 52* 47* 37* 31* 33*  CREATININE 2.31* 1.99* 1.64* 1.20 1.18  CALCIUM 9.2 8.5* 8.8* 9.1 9.0  MG  --  2.5*  --   --    --   PHOS  --  3.0  --   --   --    GFR: Estimated Creatinine Clearance: 62.9 mL/min (by C-G formula based on SCr of 1.18 mg/dL). Liver Function Tests: No results for input(s): AST, ALT, ALKPHOS, BILITOT, PROT, ALBUMIN in the last 168 hours. No results for input(s): LIPASE, AMYLASE in the last 168 hours. No results for input(s): AMMONIA in the last 168 hours. Coagulation Profile: No results for input(s): INR, PROTIME in the last 168 hours. Cardiac Enzymes: No results for input(s): CKTOTAL, CKMB, CKMBINDEX, TROPONINI in the last 168 hours. BNP (last 3 results) No results for input(s): PROBNP in the last 8760 hours. HbA1C: No results for input(s): HGBA1C in the last 72 hours. CBG:  Recent Labs Lab 08/04/16 0645 08/04/16 1142 08/04/16 1623 08/04/16 2113 08/05/16 0619  GLUCAP 274* 348* 325* 280* 179*   Lipid Profile:  Recent Labs  08/05/16 0305  CHOL 182  HDL 53  LDLCALC 119*  TRIG 52  CHOLHDL 3.4   Thyroid Function Tests: No results for input(s): TSH, T4TOTAL, FREET4, T3FREE, THYROIDAB in the last 72 hours. Anemia Panel: No results for input(s): VITAMINB12, FOLATE, FERRITIN, TIBC, IRON, RETICCTPCT in the last 72 hours. Sepsis Labs: No results for input(s): PROCALCITON, LATICACIDVEN in the last 168 hours.  No results found for this or any previous visit (from the past 240 hour(s)).       Radiology Studies: Mr Shirlee LatchMra Head RUWo Contrast  Result Date: 08/05/2016 CLINICAL DATA:  Stroke follow-up. EXAM: MRA HEAD WITHOUT CONTRAST TECHNIQUE: Angiographic images of the Circle of Willis were obtained using MRA technique without intravenous contrast. COMPARISON:  Brain MRI from 2 days prior FINDINGS: Symmetric carotid and vertebral arteries. Incomplete circle-of-Willis. No major branch occlusion or flow limiting stenosis. Negative for beading, aneurysm or vascular malformation. IMPRESSION: Negative intracranial MRA. Electronically Signed   By: Marnee SpringJonathon  Watts M.D.   On:  08/05/2016 09:24   [name]  Result Date: 08/03/2016 CLINICAL DATA:  56 y/o  M; altered mental status.  DKA. EXAM: MRI HEAD WITHOUT CONTRAST TECHNIQUE: Multiplanar, multiecho pulse sequences of the brain and surrounding structures were obtained without intravenous contrast. COMPARISON:  07/31/2016 CT head. FINDINGS: Brain: There multiple punctate foci of diffusion hyperintensity with variable ADC throughout subcortical white matter in the frontal and parietal lobes compatible with areas of acute/ early subacute ischemia. Additionally, there is reduced diffusion throughout the splenium of corpus callosum without mass effect. There is extensive confluent T2 FLAIR hyperintense signal abnormality throughout the supratentorial subcortical and periventricular white matter compatible with severe chronic microvascular ischemic changes. Severe brain parenchymal volume loss. There multiple chronic infarctions in periventricular white matter and chronic lacunar infarcts within bilateral lentiform nuclei in the right caudate body. No intracranial hemorrhage, focal mass effect, extra-axial collection, or hydrocephalus. Vascular: Normal flow voids. Skull and upper cervical spine: Normal marrow signal. Sinuses/Orbits: Right maxillary sinus mucous retention cysts. Trace right mastoid effusion. Normal orbits. Other: None.  IMPRESSION: 1. Multiple punctate foci of diffusion hyperintensity with variable ADC throughout subcortical white matter of the frontal and parietal lobes compatible with multiple microvascular infarcts of varying ages. 2. Mildly reduced diffusion within splenium of corpus callosum without mass effect. Given patient's history this is probably related to metabolic derangement. There is a broad differential, for example ischemia, seizure, drug toxicity (antiepileptic, metronidazole, sympathomimetic.), ETOH encephalopathy, or hypoglycemia. 3. Severe chronic microvascular ischemic changes, numerous small  chronic infarcts, and severe parenchymal volume loss. These results will be called to the ordering clinician or representative by the Radiologist Assistant, and communication documented in the PACS or zVision Dashboard. Electronically Signed   By: Mitzi Hansen M.D.   On: 08/03/2016 22:01        Scheduled Meds: . atorvastatin  40 mg Oral q1800  . enoxaparin (LOVENOX) injection  40 mg Subcutaneous Q24H  . feeding supplement (GLUCERNA SHAKE)  237 mL Oral QID  . hydrALAZINE  50 mg Oral TID  . insulin aspart  0-9 Units Subcutaneous TID WC  . insulin aspart protamine- aspart  24 Units Subcutaneous BID WC  . isosorbide dinitrate  30 mg Oral TID  . metoprolol tartrate  50 mg Oral BID  . nicotine  21 mg Transdermal Daily   Continuous Infusions: . sodium chloride Stopped (08/03/16 2052)  . sodium chloride Stopped (07/31/16 0736)     LOS: 5 days    Dron Jaynie Collins, MD [facility] Pager [phone]  If 7PM-7AM, please contact night-coverage [email or web address] Password [employer] 08/05/2016, 11:58 AM

## 2016-08-05 NOTE — Progress Notes (Signed)
Echocardiogram complete  

## 2016-08-05 NOTE — Progress Notes (Signed)
STROKE TEAM PROGRESS NOTE   HISTORY OF PRESENT ILLNESS (per record) Mike [name] is an 56 y.o. male with medical history significant of homeless, hypertension, hyperlipidemia, diabetes mellitus, depression, anxiety, seizure, migraine headache, tobacco abuse, CKD-2, who presents with altered mental status.Patient  was foundlaying down in front of [employer]. He was confused and while in ED a very poor and evasive historian, but is complaining of generalized weakness. He has been  been compliant with his insulin and his antihypertensive medications. However, on more persistent questioning while in the ED, he noted that maybe he might have skipped some doses. While in the ED he was found to have DKA with anion gap 27, blood sugar 536, bicarbonate 9, WBC 9.7, negative UDS, negative urinalysis, worsening renal function, temperature 99.3, tachycardia, tachypnea, O2 sat 99% on room air, blood pressure is elevated at 224/101. Further evaluation with MRI showed Multiple punctate foci of diffusion hyperintensity with variable ADC throughout subcortical white matter of the frontal and parietal lobes compatible with multiple microvascular infarcts of varying ages, along with mildly reduced diffusion within splenium of corpus callosum without mass effect. This is probably related to metabolic derangement.   During consultation patient is very nonverbal. Not because he cannot speak or he has a aphasia but due to the fact that he does not want to converse. Initially he did not want to take part in any portion of the exam however as time went on he was more sociable and able to follow commands. Patient did not like answering any questions I was asked thus remain silent. States he is homeless and he lives wherever he wants to live. States that he does have problems getting his medications. That is much as I could get out of him  Date last known well: Date: 07/30/2016 Time last known well: Unable to  determine tPA Given: No: out of window     SUBJECTIVE (INTERVAL HISTORY) No family members present. The patient reports that he is feeling improved. He is without complaints. He is oriented except to date.   OBJECTIVE Temp:  [98.6 F (37 C)-99.1 F (37.3 C)] 98.6 F (37 C) (07/21 0400) Pulse Rate:  [75-96] 77 (07/21 0400) Cardiac Rhythm: Normal sinus rhythm (07/21 0734) Resp:  [14-21] 16 (07/21 0400) BP: (110-164)/(50-93) 137/64 (07/21 0400) SpO2:  [98 %-100 %] 100 % (07/21 0400)  CBC:   Recent Labs Lab 07/31/16 0017  WBC 9.7  NEUTROABS 8.3*  HGB 12.8*  HCT 40.2  MCV 80.4  PLT 351    Basic Metabolic Panel:  Recent Labs Lab 07/31/16 0255  07/31/16 1050 07/31/16 1413  NA 140  < > 143 146*  K 4.1  < > 3.7 3.8  CL 107  < > 114* 116*  CO2 12*  < > 19* 22  GLUCOSE 375*  < > 126* 117*  BUN 47*  < > 31* 33*  CREATININE 1.99*  < > 1.20 1.18  CALCIUM 8.5*  < > 9.1 9.0  MG 2.5*  --   --   --   PHOS 3.0  --   --   --   < > = values in this interval not displayed.  Lipid Panel:     Component Value Date/Time   CHOL 182 08/05/2016 0305   TRIG 52 08/05/2016 0305   HDL 53 08/05/2016 0305   CHOLHDL 3.4 08/05/2016 0305   VLDL 10 08/05/2016 0305   LDLCALC 119 (H) 08/05/2016 0305   HgbA1c:  Lab Results  Component  Value Date   HGBA1C 12.4 (H) 07/31/2016   Urine Drug Screen:     Component Value Date/Time   LABOPIA NONE DETECTED 07/31/2016 0031   COCAINSCRNUR NONE DETECTED 07/31/2016 0031   LABBENZ NONE DETECTED 07/31/2016 0031   AMPHETMU NONE DETECTED 07/31/2016 0031   THCU NONE DETECTED 07/31/2016 0031   LABBARB NONE DETECTED 07/31/2016 0031    Alcohol Level     Component Value Date/Time   ETH <5 07/31/2016 0017    IMAGING  [name] 08/03/2016 1. Multiple punctate foci of diffusion hyperintensity with variable ADC throughout subcortical white matter of the frontal and parietal lobes compatible with multiple microvascular infarcts of varying  ages.  2. Mildly reduced diffusion within splenium of corpus callosum without mass effect. Given patient's history this is probably related to metabolic derangement. There is a broad differential, for example ischemia, seizure, drug toxicity (antiepileptic, metronidazole, sympathomimetic.), ETOH encephalopathy, or hypoglycemia.  3. Severe chronic microvascular ischemic changes, numerous small chronic infarcts, and severe parenchymal volume loss.   MRA Head 08/05/2016 Negative intracranial MRA    PHYSICAL EXAM Middle-aged African-American male was not in distress. . Afebrile. Head is nontraumatic. Neck is supple without bruit.    Cardiac exam no murmur or gallop. Lungs are clear to auscultation. Distal pulses are well felt.  Neurological Exam ;  Awake  Alert oriented x 2. Diminished attention, registration and recall.. Normal speech and language.eye movements full without nystagmus.fundi were not visualized. Vision acuity and fields appear normal. Hearing is normal. Palatal movements are normal. Face symmetric. Tongue midline. Normal strength, tone, reflexes and coordination. Normal sensation. Gait deferred.     ASSESSMENT/PLAN [name] is a 56 y.o. male with history of being homeless, hypertension, hyperlipidemia, diabetes mellitus, depression, anxiety, seizure, migraine headaches, tobacco abuse, CKD-2,  presenting with DKA - blood glucose 536, confusion, generalized weakness, elevated blood pressure and low-grade fever. He did not receive IV t-PA due to late presentation.  Strokes:  Multiple punctate infarcts throughout the subcortical white matter of the frontal and parietal lobes.  Resultant  no focal deficits  CT head - No acute intracranial hemorrhage. Stable global atrophy  MRI head - Multiple punctate foci of diffusion hyperintensity with variable ADC throughout subcortical white matter of the frontal and parietal lobes.  MRA head - negative intracranial  MRA  Carotid Doppler - unremarkable  2D Echo - EF 65-70%. No cardiac source of emboli was identified.  LDL - 119  HgbA1c - 12.4  VTE prophylaxis - Lovenox Diet Carb Modified Fluid consistency: Thin; Room service appropriate? Yes  No antithrombotic prior to admission, now on aspirin 325 mg daily  Patient counseled to be compliant with his antithrombotic medications  Ongoing aggressive stroke risk factor management  Therapy recommendations:  No follow-up physical therapy recommended.  Disposition: Pending  Hypertension  Stable  Permissive hypertension (OK if < 220/120) but gradually normalize in 5-7 days  Long-term BP goal normotensive  Hyperlipidemia  Home meds:  No lipid lowering medications prior to admission  LDL 119, goal < 70  Now on Lipitor 40 mg daily  Continue statin at discharge  Diabetes  HgbA1c 12.4, goal < 7.0  Uncontrolled  Other Stroke Risk Factors  Cigarette smoker advised to stop smoking  ETOH use, advised to drink no more than 1 drink per day  Migraines  Other Active Problems  [facility] day # 5  I have personally examined this patient, reviewed notes, independently viewed imaging studies, participated in medical  decision making and plan of care.ROS completed by me personally and pertinent positives fully documented  I have made any additions or clarifications directly to the above note.  He presented with altered mental status in the setting of diabetic ketoacidosis and hypertensive encephalopathy. MRI scan shows tiny scattered punctate infarcts likely due to small vessel disease. Recommend continue aggressive medical management with strict control of hypertension and diabetes and hydration. Continue aspirin for stroke prevention. Continue ongoing stroke workup. No family available at bedside for discussion. Greater than 50% time during this 35 minute visit was spent on counseling and coordination of care about his multiple lacunar  infarcts  Delia HeadyPramod Equilla Que, MD Medical Director [facility] Pager: [phone] 08/05/2016 3:01 PM   To contact Stroke Continuity provider, please refer to [email or web address]. After hours, contact General Neurology

## 2016-08-06 LAB — GLUCOSE, CAPILLARY
GLUCOSE-CAPILLARY: 279 mg/dL — AB (ref 65–99)
Glucose-Capillary: 326 mg/dL — ABNORMAL HIGH (ref 65–99)

## 2016-08-06 MED ORDER — METOPROLOL TARTRATE 50 MG PO TABS: 50.0000 mg | ORAL_TABLET | Freq: Two times a day (BID) | ORAL | 0 refills | Status: DC

## 2016-08-06 MED ORDER — INSULIN ASPART PROT & ASPART (70-30 MIX) 100 UNIT/ML ~~LOC~~ SUSP: 25.0000 [IU] | Freq: Two times a day (BID) | SUBCUTANEOUS | 0 refills | Status: DC

## 2016-08-06 MED ORDER — ASPIRIN 325 MG PO TABS: 325.0000 mg | ORAL_TABLET | Freq: Every day | ORAL | 0 refills | Status: DC

## 2016-08-06 MED ORDER — ATORVASTATIN CALCIUM 40 MG PO TABS: 40.0000 mg | ORAL_TABLET | Freq: Every day | ORAL | 0 refills | Status: DC

## 2016-08-06 NOTE — Discharge Summary (Signed)
Physician Discharge Summary  Mike [name] ZOX:[id number] DOB: 1960-11-21 DOA: 07/30/2016  PCP: [name], NP  Admit date: 07/30/2016 Discharge date: 08/06/2016  Admitted From:Home Disposition:home  Recommendations for Outpatient Follow-up:  1. Follow up with PCP in 1-2 weeks 2. Please obtain BMP/CBC in one week  Home Health:no Equipment/Devices:none Discharge Condition:stable CODE STATUS:full code Diet recommendation:carb modified heart healthy  Brief/Interim Summary: 55 y.o.malewith medical history significant of homeless, hypertension, hyperlipidemia, diabetes mellitus, depression, anxiety, seizure, migraine headache, tobacco abuse, who presents with altered mental status.Patient was found to be in DKA, with anion gap of 27.  #Diabetic ketoacidosis without coma: Anion gap closed. Off IV insulin.   #Diabetes mellitus with hyperglycemia: The dose of insulin increased because of hyperglycemia. Recommended to monitor blood sugar level at home and follow up with PCP. Educated on low carb diet. Patient has A1c of 12.4.  #Acute metabolic encephalopathy likely in the setting of DKA and multiple punctate ischemic stroke, likely acute .  -CT scan of head with no acute finding however consistent with global atrophy and white matter hypodensities.  -MRI of the brain showed multiple punctuate foci of hyperintensity consistent with multiple microvascular infarcts of varying ages.  -Carotid Doppler unremarkable, MRA negative.  echocardiogram with no source of emboli. LDL 119. Started Lipitor and aspirin. Tolerating well -Patient will likely needs outpatient, and his evaluation. He has no difficulty with swallowing. No nausea vomiting or abdominal pain. His mental status improved. Outpatient neurology referral made on discharge. I discussed with stroke neurologist today regarding discharge planning.  #Hypertension: Patient has fluctuation in blood pressure. Continue current blood  pressure medication. Recommended to monitor blood pressure.  #Acute kidney injury: Improved now.  #Homelessness issue: Case Production designer, theatre/television/film and social worker consult appreciated. Patient reported he has placed to go and can make outpatient PCP follow-up.  Discharge Diagnoses:  Principal Problem:   DKA (diabetic ketoacidoses) (HCC) Active Problems:   Hypertensive urgency   Tobacco abuse   Diabetes mellitus with complication (HCC)   Acute renal failure superimposed on stage 2 chronic kidney disease (HCC)   Homeless   Acute metabolic encephalopathy   Confusion    Discharge Instructions  Discharge Instructions    Ambulatory referral to Neurology    Complete by:  As directed    An appointment is requested in approximately: 2 weeks   Call MD for:  difficulty breathing, headache or visual disturbances    Complete by:  As directed    Call MD for:  extreme fatigue    Complete by:  As directed    Call MD for:  hives    Complete by:  As directed    Call MD for:  persistant dizziness or light-headedness    Complete by:  As directed    Call MD for:  persistant nausea and vomiting    Complete by:  As directed    Call MD for:  severe uncontrolled pain    Complete by:  As directed    Call MD for:  temperature >100.4    Complete by:  As directed    Diet - low sodium heart healthy    Complete by:  As directed    Diet Carb Modified    Complete by:  As directed    Increase activity slowly    Complete by:  As directed      Allergies as of 08/06/2016      Reactions   Ibuprofen Nausea And Vomiting, Other (See Comments)   Reaction:  Bloating  Tylenol [acetaminophen] Nausea And Vomiting, Other (See Comments)   Reaction:  Bloating       Medication List    STOP taking these medications   insulin NPH Human 100 UNIT/ML injection Commonly known as:  NOVOLIN N     TAKE these medications   aspirin 325 MG tablet Take 1 tablet (325 mg total) by mouth daily.   atorvastatin 40 MG  tablet Commonly known as:  LIPITOR Take 1 tablet (40 mg total) by mouth daily at 6 PM.   hydrALAZINE 50 MG tablet Commonly known as:  APRESOLINE Take 1 tablet (50 mg total) by mouth 3 (three) times daily.   insulin aspart 100 UNIT/ML injection Commonly known as:  novoLOG Inject 3 Units into the skin 3 (three) times daily with meals.   insulin aspart protamine- aspart (70-30) 100 UNIT/ML injection Commonly known as:  NOVOLOG MIX 70/30 Inject 0.25 mLs (25 Units total) into the skin 2 (two) times daily with a meal. What changed:  how much to take   Insulin Syringe 27G X 1/2" 0.5 ML Misc 1 Syringe by Does not apply route 5 (five) times daily.   isosorbide dinitrate 20 MG tablet Commonly known as:  ISORDIL Take 1.5 tablets (30 mg total) by mouth 3 (three) times daily.   metFORMIN 500 MG tablet Commonly known as:  GLUCOPHAGE Take 1 tablet (500 mg total) by mouth 2 (two) times daily with a meal.   metoprolol tartrate 50 MG tablet Commonly known as:  LOPRESSOR Take 1 tablet (50 mg total) by mouth 2 (two) times daily. What changed:  medication strength  how much to take      Follow-up Information    [facility] Follow up.   Why:  Go for f/u with [facility] and any needs for assistance with mediations Contact information: [address] [place] [phone]       [name], [name], NP. Schedule an appointment as soon as possible for a visit in 1 week(s).   Contact information: [address] [place] [address] [phone]          Allergies  Allergen Reactions  . Ibuprofen Nausea And Vomiting and Other (See Comments)    Reaction:  Bloating   . Tylenol [Acetaminophen] Nausea And Vomiting and Other (See Comments)    Reaction:  Bloating     Consultations: Neurology  Procedures/Studies: Echo, MRI, carotid Doppler  Subjective: Seen and examined at bedside. Denied headache, dizziness, nausea vomiting chest pain or  shortness of breath.  Discharge Exam: Vitals:   08/06/16 0458 08/06/16 0800  BP: (!) 128/56 (!) 144/65  Pulse: (!) 20 75  Resp:  (!) 22  Temp: 98.6 F (37 C) 98.4 F (36.9 C)   Vitals:   08/06/16 0000 08/06/16 0013 08/06/16 0458 08/06/16 0800  BP: (!) 116/57  (!) 128/56 (!) 144/65  Pulse: (!) 59  (!) 20 75  Resp: 17   (!) 22  Temp:  98.9 F (37.2 C) 98.6 F (37 C) 98.4 F (36.9 C)  TempSrc:  Oral Oral Oral  SpO2: 100%  98% 100%  Weight:      Height:        General: Pt is alert, awake, not in acute distress Cardiovascular: RRR, S1/S2 +, no rubs, no gallops Respiratory: CTA bilaterally, no wheezing, no rhonchi Abdominal: Soft, NT, ND, bowel sounds + Extremities: no edema, no cyanosis    The results of significant diagnostics from this hospitalization (including imaging, microbiology,  ancillary and laboratory) are listed below for reference.     Microbiology: No results found for this or any previous visit (from the past 240 hour(s)).   Labs: BNP (last 3 results) No results for input(s): BNP in the last 8760 hours. Basic Metabolic Panel:  Recent Labs Lab 07/31/16 0017 07/31/16 0255 07/31/16 0630 07/31/16 1050 07/31/16 1413  NA 134* 140 141 143 146*  K 4.9 4.1 4.2 3.7 3.8  CL 98* 107 111 114* 116*  CO2 9* 12* 12* 19* 22  GLUCOSE 536* 375* 293* 126* 117*  BUN 52* 47* 37* 31* 33*  CREATININE 2.31* 1.99* 1.64* 1.20 1.18  CALCIUM 9.2 8.5* 8.8* 9.1 9.0  MG  --  2.5*  --   --   --   PHOS  --  3.0  --   --   --    Liver Function Tests: No results for input(s): AST, ALT, ALKPHOS, BILITOT, PROT, ALBUMIN in the last 168 hours. No results for input(s): LIPASE, AMYLASE in the last 168 hours. No results for input(s): AMMONIA in the last 168 hours. CBC:  Recent Labs Lab 07/31/16 0017  WBC 9.7  NEUTROABS 8.3*  HGB 12.8*  HCT 40.2  MCV 80.4  PLT 351   Cardiac Enzymes: No results for input(s): CKTOTAL, CKMB, CKMBINDEX, TROPONINI in the last 168  hours. BNP: Invalid input(s): POCBNP CBG:  Recent Labs Lab 08/05/16 0619 08/05/16 1215 08/05/16 1628 08/05/16 2133 08/06/16 0650  GLUCAP 179* 231* 285* 281* 326*   D-Dimer No results for input(s): DDIMER in the last 72 hours. Hgb A1c No results for input(s): HGBA1C in the last 72 hours. Lipid Profile  Recent Labs  08/05/16 0305  CHOL 182  HDL 53  LDLCALC 119*  TRIG 52  CHOLHDL 3.4   Thyroid function studies No results for input(s): TSH, T4TOTAL, T3FREE, THYROIDAB in the last 72 hours.  Invalid input(s): FREET3 Anemia work up No results for input(s): VITAMINB12, FOLATE, FERRITIN, TIBC, IRON, RETICCTPCT in the last 72 hours. Urinalysis    Component Value Date/Time   COLORURINE STRAW (A) 07/31/2016 0030   APPEARANCEUR CLEAR 07/31/2016 0030   LABSPEC 1.020 07/31/2016 0030   PHURINE 5.0 07/31/2016 0030   GLUCOSEU >=500 (A) 07/31/2016 0030   HGBUR SMALL (A) 07/31/2016 0030   BILIRUBINUR NEGATIVE 07/31/2016 0030   KETONESUR 80 (A) 07/31/2016 0030   PROTEINUR 30 (A) 07/31/2016 0030   UROBILINOGEN 1.0 02/07/2011 1605   NITRITE NEGATIVE 07/31/2016 0030   LEUKOCYTESUR NEGATIVE 07/31/2016 0030   Sepsis Labs Invalid input(s): PROCALCITONIN,  WBC,  LACTICIDVEN Microbiology No results found for this or any previous visit (from the past 240 hour(s)).   Time coordinating discharge: 33 minutes  SIGNED:   Maxie Barbron Prasad Bhandari, MD  [facility] 08/06/2016, 11:23 AM  If 7PM-7AM, please contact night-coverage [email or web address] Password [email or web address]

## 2016-08-06 NOTE — Accreditation Note (Signed)
Pt d/c home per MD order, Pt VSS, pt verbalized understanding of d/c, all questions answered[facility]

## 2016-08-06 NOTE — Progress Notes (Signed)
STROKE TEAM PROGRESS NOTE   HISTORY OF PRESENT ILLNESS (per record) Mike [name] is an 57 y.o. male with medical history significant of homeless, hypertension, hyperlipidemia, diabetes mellitus, depression, anxiety, seizure, migraine headache, tobacco abuse, CKD-2, who presents with altered mental status.Patient  was foundlaying down in front of [employer]. He was confused and while in ED a very poor and evasive historian, but is complaining of generalized weakness. He has been  been compliant with his insulin and his antihypertensive medications. However, on more persistent questioning while in the ED, he noted that maybe he might have skipped some doses. While in the ED he was found to have DKA with anion gap 27, blood sugar 536, bicarbonate 9, WBC 9.7, negative UDS, negative urinalysis, worsening renal function, temperature 99.3, tachycardia, tachypnea, O2 sat 99% on room air, blood pressure is elevated at 224/101. Further evaluation with MRI showed Multiple punctate foci of diffusion hyperintensity with variable ADC throughout subcortical white matter of the frontal and parietal lobes compatible with multiple microvascular infarcts of varying ages, along with mildly reduced diffusion within splenium of corpus callosum without mass effect. This is probably related to metabolic derangement.   During consultation patient is very nonverbal. Not because he cannot speak or he has a aphasia but due to the fact that he does not want to converse. Initially he did not want to take part in any portion of the exam however as time went on he was more sociable and able to follow commands. Patient did not like answering any questions I was asked thus remain silent. States he is homeless and he lives wherever he wants to live. States that he does have problems getting his medications. That is much as I could get out of him  Date last known well: Date: 07/30/2016 Time last known well: Unable to  determine tPA Given: No: out of window     SUBJECTIVE (INTERVAL HISTORY) No family members present. The patient reports that he is feeling improved. He is without complaints. Echo was normal  OBJECTIVE Temp:  [98.3 F (36.8 C)-99.1 F (37.3 C)] 98.3 F (36.8 C) (07/22 1200) Pulse Rate:  [20-91] 75 (07/22 0800) Cardiac Rhythm: Normal sinus rhythm (07/22 0731) Resp:  [17-22] 18 (07/22 1200) BP: (105-146)/(50-73) 113/54 (07/22 1200) SpO2:  [98 %-100 %] 100 % (07/22 0800)  CBC:   Recent Labs Lab 07/31/16 0017  WBC 9.7  NEUTROABS 8.3*  HGB 12.8*  HCT 40.2  MCV 80.4  PLT 351    Basic Metabolic Panel:  Recent Labs Lab 07/31/16 0255  07/31/16 1050 07/31/16 1413  NA 140  < > 143 146*  K 4.1  < > 3.7 3.8  CL 107  < > 114* 116*  CO2 12*  < > 19* 22  GLUCOSE 375*  < > 126* 117*  BUN 47*  < > 31* 33*  CREATININE 1.99*  < > 1.20 1.18  CALCIUM 8.5*  < > 9.1 9.0  MG 2.5*  --   --   --   PHOS 3.0  --   --   --   < > = values in this interval not displayed.  Lipid Panel:     Component Value Date/Time   CHOL 182 08/05/2016 0305   TRIG 52 08/05/2016 0305   HDL 53 08/05/2016 0305   CHOLHDL 3.4 08/05/2016 0305   VLDL 10 08/05/2016 0305   LDLCALC 119 (H) 08/05/2016 0305   HgbA1c:  Lab Results  Component Value Date  HGBA1C 12.4 (H) 07/31/2016   Urine Drug Screen:     Component Value Date/Time   LABOPIA NONE DETECTED 07/31/2016 0031   COCAINSCRNUR NONE DETECTED 07/31/2016 0031   LABBENZ NONE DETECTED 07/31/2016 0031   AMPHETMU NONE DETECTED 07/31/2016 0031   THCU NONE DETECTED 07/31/2016 0031   LABBARB NONE DETECTED 07/31/2016 0031    Alcohol Level     Component Value Date/Time   ETH <5 07/31/2016 0017    IMAGING  [name] 08/03/2016 1. Multiple punctate foci of diffusion hyperintensity with variable ADC throughout subcortical white matter of the frontal and parietal lobes compatible with multiple microvascular infarcts of varying ages.  2.  Mildly reduced diffusion within splenium of corpus callosum without mass effect. Given patient's history this is probably related to metabolic derangement. There is a broad differential, for example ischemia, seizure, drug toxicity (antiepileptic, metronidazole, sympathomimetic.), ETOH encephalopathy, or hypoglycemia.  3. Severe chronic microvascular ischemic changes, numerous small chronic infarcts, and severe parenchymal volume loss.   MRA Head 08/05/2016 Negative intracranial MRA    PHYSICAL EXAM Middle-aged African-American male was not in distress. . Afebrile. Head is nontraumatic. Neck is supple without bruit.    Cardiac exam no murmur or gallop. Lungs are clear to auscultation. Distal pulses are well felt.  Neurological Exam ;  Awake  Alert oriented x 2. Diminished attention, registration and recall.. Normal speech and language.eye movements full without nystagmus.fundi were not visualized. Vision acuity and fields appear normal. Hearing is normal. Palatal movements are normal. Face symmetric. Tongue midline. Normal strength, tone, reflexes and coordination. Normal sensation. Gait deferred.     ASSESSMENT/PLAN [name] is a 56 y.o. male with history of being homeless, hypertension, hyperlipidemia, diabetes mellitus, depression, anxiety, seizure, migraine headaches, tobacco abuse, CKD-2,  presenting with DKA - blood glucose 536, confusion, generalized weakness, elevated blood pressure and low-grade fever. He did not receive IV t-PA due to late presentation.  Strokes:  Multiple punctate infarcts throughout the subcortical white matter of the frontal and parietal lobes.  Resultant  no focal deficits  CT head - No acute intracranial hemorrhage. Stable global atrophy  MRI head - Multiple punctate foci of diffusion hyperintensity with variable ADC throughout subcortical white matter of the frontal and parietal lobes.  MRA head - negative intracranial MRA  Carotid Doppler -  unremarkable  2D Echo - EF 65-70%. No cardiac source of emboli was identified.  LDL - 119  HgbA1c - 12.4  VTE prophylaxis - Lovenox Diet Carb Modified Fluid consistency: Thin; Room service appropriate? Yes Diet - low sodium heart healthy Diet Carb Modified  No antithrombotic prior to admission, now on aspirin 325 mg daily  Patient counseled to be compliant with his antithrombotic medications  Ongoing aggressive stroke risk factor management  Therapy recommendations:  No follow-up physical therapy recommended.  Disposition: home  Hypertension  Stable  Permissive hypertension (OK if < 220/120) but gradually normalize in 5-7 days  Long-term BP goal normotensive  Hyperlipidemia  Home meds:  No lipid lowering medications prior to admission  LDL 119, goal < 70  Now on Lipitor 40 mg daily  Continue statin at discharge  Diabetes  HgbA1c 12.4, goal < 7.0  Uncontrolled  Other Stroke Risk Factors  Cigarette smoker advised to stop smoking  ETOH use, advised to drink no more than 1 drink per day  Migraines  Other Active Problems  [facility] day # 6  I have personally examined this patient, reviewed notes, independently viewed  imaging studies, participated in medical decision making and plan of care.ROS completed by me personally and pertinent positives fully documented  I have made any additions or clarifications directly to the above note.  He presented with altered mental status in the setting of diabetic ketoacidosis and hypertensive encephalopathy. MRI scan shows tiny scattered punctate infarcts likely due to small vessel disease. Recommend continue aggressive medical management with strict control of hypertension and diabetes and hydration. Continue aspirin for stroke prevention. . No family available at bedside for discussion. D/w Dr Ronalee BeltsBhandari. Stroke team will sign off. Call for questions.  Delia HeadyPramod Sethi, MD Medical Director [facility] Pager: [phone] 08/06/2016 1:03 PM   To contact Stroke Continuity provider, please refer to [email or web address]. After hours, contact General Neurology

## 2016-08-06 NOTE — Evaluation (Signed)
Speech Language Pathology Evaluation Patient Details Name: Mike [name] MRN: [id number] DOB: 1960/02/14 Today's Date: 08/06/2016 Time: [id number] SLP Time Calculation (min) (ACUTE ONLY): 14 min  Problem List:  Patient Active Problem List   Diagnosis Date Noted  . Confusion   . Acute renal failure superimposed on stage 2 chronic kidney disease (HCC) 07/31/2016  . Homeless 07/31/2016  . Acute metabolic encephalopathy 07/31/2016  . DKA (diabetic ketoacidoses) (HCC) 06/24/2016  . Acute encephalopathy 06/24/2016  . Acute kidney injury (nontraumatic) (HCC) 06/24/2016  . Protein-calorie malnutrition, severe 12/24/2014  . Chest pain, pleuritic 12/23/2014  . Homelessness 12/23/2014  . Hypertensive urgency 12/23/2014  . DM (diabetes mellitus) (HCC) 12/23/2014  . Intermittent palpitations 12/23/2014  . Tobacco abuse 12/23/2014  . Pleuritic chest pain 12/23/2014  . Abnormal EKG 12/23/2014  . Cough   . Diabetes mellitus with complication [facility](HCC)    Past Medical History:  Past Medical History:  Diagnosis Date  . Anxiety   . Anxiety   . Chronic lower back pain   . Depression   . DKA (diabetic ketoacidoses) (HCC) 07/30/2016  . Hyperlipemia   . Hypertension   . Migraine    "last one was ~ 4 yr ago" (12/23/2014)  . Seizures (HCC)    "related to pills for anxiety; if I don't take the pills I'm suppose to take I'll have them" (12/23/2014)  . Type II diabetes mellitus (HCC)    Past Surgical History:  Past Surgical History:  Procedure Laterality Date  . NO PAST SURGERIES     HPI:  [id number] [place], hypertension, hyperlipidemia, diabetes mellitus, depression, anxiety, seizure, migraine headaches, tobacco abuse, CKD-2,presenting with DKA - blood glucose 536, confusion, generalized weakness, elevated blood pressure and low-grade fever.  MRI- Multiple punctate infarcts throughout the subcortical white matter of the frontal and parietal lobes.    Assessment / Plan / Recommendation Clinical Impression  Pt presents with functional language; limited spontaneity overall. Followed basic commands; no overt deficits in comprehension/expression.  Speech with occasional syllable repetitions, generally intelligible.  Delayed response time noted; responses given in short phrases with no elaboration.  Oriented to place/person; inconsistent responses to elements of time.  Mild deficits in short-term recall.  Information about baseline function not available. Pt states he will likely go [facility] to [employer] [facility]. He states that his girlfriend, Mike BattiestVeronica, who lives in [place], will be picking him up at time of D/C; RN says no family/friends have been present.  No speech/communication needs are identified - our services will sign off.     SLP Assessment  SLP Recommendation/Assessment: Patient does not need any further Speech Lanaguage Pathology Services    Follow Up Recommendations  None    Frequency and Duration           SLP Evaluation Cognition  Overall Cognitive Status: Difficult to assess Arousal/Alertness: Awake/alert Orientation Level: Oriented to person;Oriented to place;Oriented to situation;Disoriented to time Attention: Sustained Sustained Attention: Appears intact Memory: Impaired Memory Impairment: Retrieval deficit Problem Solving: Impaired Problem Solving Impairment: Verbal complex       Comprehension  Auditory Comprehension Overall Auditory Comprehension: Appears within functional limits for tasks assessed Visual Recognition/Discrimination Discrimination: Within Function Limits Reading Comprehension Reading Status: Not tested    Expression Expression Primary Mode of Expression: Verbal Verbal Expression Overall Verbal Expression: Appears within functional limits for tasks assessed Level of Generative/Spontaneous Verbalization: Phrase Repetition: No impairment Naming: No impairment Written Expression Dominant  Hand: Right   Oral / Motor  Oral  Motor/Sensory Function Overall Oral Motor/Sensory Function: Within functional limits Motor Speech Overall Motor Speech: Impaired Articulation: Impaired Level of Impairment: Sentence Intelligibility: Intelligibility reduced Word: 75-100% accurate Motor Planning: Witnin functional limits   GO                    Mike [name] Mike [name] 08/06/2016, 12:08 PM

## 2016-08-07 LAB — VAS US CAROTID
LEFT ECA DIAS: -4 cm/s
LEFT VERTEBRAL DIAS: 8 cm/s
Left CCA dist dias: -18 cm/s
Left CCA dist sys: -132 cm/s
Left CCA prox dias: 7 cm/s
Left CCA prox sys: 144 cm/s
Left ICA prox dias: -19 cm/s
Left ICA prox sys: -95 cm/s
RCCADSYS: -69 cm/s
RCCAPDIAS: -20 cm/s
RIGHT ECA DIAS: -18 cm/s
RIGHT VERTEBRAL DIAS: 8 cm/s
Right CCA prox sys: -165 cm/s

## 2016-08-12 NOTE — Progress Notes (Signed)
Late entry:  Patient with severe protein calorie malnutrition in the setting of chronic illness.

## 2016-08-15 DIAGNOSIS — I639 Cerebral infarction, unspecified: Secondary | ICD-10-CM

## 2016-08-24 ENCOUNTER — Inpatient Hospital Stay (HOSPITAL_COMMUNITY)
Admission: EM | Admit: 2016-08-24 | Discharge: 2016-08-25 | DRG: 637 | Disposition: A | Discharge status: Home / Self Care | Payer: Self-pay | Attending: Internal Medicine | Admitting: Internal Medicine

## 2016-08-24 DIAGNOSIS — M545 Low back pain: Secondary | ICD-10-CM | POA: Diagnosis present

## 2016-08-24 DIAGNOSIS — F329 Major depressive disorder, single episode, unspecified: Secondary | ICD-10-CM | POA: Diagnosis present

## 2016-08-24 DIAGNOSIS — E1100 Type 2 diabetes mellitus with hyperosmolarity without nonketotic hyperglycemic-hyperosmolar coma (NKHHC): Secondary | ICD-10-CM | POA: Diagnosis present

## 2016-08-24 DIAGNOSIS — Z720 Tobacco use: Secondary | ICD-10-CM

## 2016-08-24 DIAGNOSIS — F17210 Nicotine dependence, cigarettes, uncomplicated: Secondary | ICD-10-CM | POA: Diagnosis present

## 2016-08-24 DIAGNOSIS — Z9114 Patient's other noncompliance with medication regimen: Secondary | ICD-10-CM

## 2016-08-24 DIAGNOSIS — Z5900 Homelessness unspecified: Secondary | ICD-10-CM

## 2016-08-24 DIAGNOSIS — E1101 Type 2 diabetes mellitus with hyperosmolarity with coma: Secondary | ICD-10-CM

## 2016-08-24 DIAGNOSIS — G9341 Metabolic encephalopathy: Secondary | ICD-10-CM | POA: Diagnosis present

## 2016-08-24 DIAGNOSIS — Z7982 Long term (current) use of aspirin: Secondary | ICD-10-CM

## 2016-08-24 DIAGNOSIS — N182 Chronic kidney disease, stage 2 (mild): Secondary | ICD-10-CM | POA: Diagnosis present

## 2016-08-24 DIAGNOSIS — E1129 Type 2 diabetes mellitus with other diabetic kidney complication: Secondary | ICD-10-CM | POA: Diagnosis present

## 2016-08-24 DIAGNOSIS — Z794 Long term (current) use of insulin: Secondary | ICD-10-CM

## 2016-08-24 DIAGNOSIS — I10 Essential (primary) hypertension: Secondary | ICD-10-CM | POA: Diagnosis present

## 2016-08-24 DIAGNOSIS — N179 Acute kidney failure, unspecified: Secondary | ICD-10-CM | POA: Diagnosis present

## 2016-08-24 DIAGNOSIS — E1122 Type 2 diabetes mellitus with diabetic chronic kidney disease: Secondary | ICD-10-CM | POA: Diagnosis present

## 2016-08-24 DIAGNOSIS — G8929 Other chronic pain: Secondary | ICD-10-CM | POA: Diagnosis present

## 2016-08-24 DIAGNOSIS — E43 Unspecified severe protein-calorie malnutrition: Secondary | ICD-10-CM | POA: Diagnosis present

## 2016-08-24 DIAGNOSIS — E1165 Type 2 diabetes mellitus with hyperglycemia: Principal | ICD-10-CM | POA: Diagnosis present

## 2016-08-24 DIAGNOSIS — Z833 Family history of diabetes mellitus: Secondary | ICD-10-CM

## 2016-08-24 DIAGNOSIS — Z681 Body mass index (BMI) 19 or less, adult: Secondary | ICD-10-CM

## 2016-08-24 DIAGNOSIS — Z888 Allergy status to other drugs, medicaments and biological substances status: Secondary | ICD-10-CM

## 2016-08-24 LAB — CBG MONITORING, ED
GLUCOSE-CAPILLARY: 470 mg/dL — AB (ref 65–99)
Glucose-Capillary: >600 mg/dL (ref 65–99)
Glucose-Capillary: 474 mg/dL — ABNORMAL HIGH (ref 65–99)

## 2016-08-24 LAB — BASIC METABOLIC PANEL
ANION GAP: 12 (ref 5–15)
BUN: 19 mg/dL (ref 6–20)
CHLORIDE: 91 mmol/L — AB (ref 101–111)
CO2: 23 mmol/L (ref 22–32)
Calcium: 9.2 mg/dL (ref 8.9–10.3)
Creatinine, Ser: 1.15 mg/dL (ref 0.61–1.24)
GFR calc non Af Amer: >60 mL/min (ref >60)
Glucose, Bld: 928 mg/dL (ref 65–99)
Potassium: 4.7 mmol/L (ref 3.5–5.1)
SODIUM: 126 mmol/L — AB (ref 135–145)

## 2016-08-24 LAB — CBC
HCT: 31.2 % — ABNORMAL LOW (ref 39.0–52.0)
HEMOGLOBIN: 10 g/dL — AB (ref 13.0–17.0)
MCH: 25.5 pg — AB (ref 26.0–34.0)
MCHC: 32.1 g/dL (ref 30.0–36.0)
MCV: 79.6 fL (ref 78.0–100.0)
Platelets: 300 10*3/uL (ref 150–400)
RBC: 3.92 MIL/uL — AB (ref 4.22–5.81)
RDW: 13.8 % (ref 11.5–15.5)
WBC: 3.9 10*3/uL — AB (ref 4.0–10.5)

## 2016-08-24 LAB — BLOOD GAS, VENOUS

## 2016-08-24 LAB — URINALYSIS, ROUTINE W REFLEX MICROSCOPIC
BILIRUBIN URINE: NEGATIVE
Bacteria, UA: NONE SEEN
Hgb urine dipstick: NEGATIVE
KETONES UR: 5 mg/dL — AB
LEUKOCYTES UA: NEGATIVE
Nitrite: NEGATIVE
PH: 5 (ref 5.0–8.0)
PROTEIN: NEGATIVE mg/dL
Specific Gravity, Urine: 1.027 (ref 1.005–1.030)

## 2016-08-24 LAB — I-STAT CG4 LACTIC ACID, ED: LACTIC ACID, VENOUS: 1.43 mmol/L (ref 0.5–1.9)

## 2016-08-24 LAB — I-STAT VENOUS BLOOD GAS, ED
ACID-BASE EXCESS: 3 mmol/L — AB (ref 0.0–2.0)
Bicarbonate: 27.3 mmol/L (ref 20.0–28.0)
O2 SAT: 99 %
PO2 VEN: 113 mmHg — AB (ref 32.0–45.0)
TCO2: 29 mmol/L (ref 0–100)
pCO2, Ven: 41.8 mmHg — ABNORMAL LOW (ref 44.0–60.0)
pH, Ven: 7.423 (ref 7.250–7.430)

## 2016-08-24 LAB — ETHANOL: Alcohol, Ethyl (B): <5 mg/dL (ref <5)

## 2016-08-24 MED ORDER — DEXTROSE-NACL 5-0.45 % IV SOLN: INTRAVENOUS | Status: DC

## 2016-08-24 MED ORDER — SODIUM CHLORIDE 0.9 % IV BOLUS (SEPSIS)
1000.0000 mL | Freq: Once | INTRAVENOUS | Status: AC
  Administered 2016-08-24: 1000 mL via INTRAVENOUS

## 2016-08-24 MED ORDER — ATORVASTATIN CALCIUM 40 MG PO TABS: 40.0000 mg | ORAL_TABLET | Freq: Every day | ORAL | Status: DC

## 2016-08-24 MED ORDER — SODIUM CHLORIDE 0.9 % IV SOLN
INTRAVENOUS | Status: DC
  Administered 2016-08-24 – 2016-08-25 (×2): via INTRAVENOUS

## 2016-08-24 MED ORDER — HYDRALAZINE HCL 20 MG/ML IJ SOLN: 5.0000 mg | INTRAMUSCULAR | Status: DC | PRN

## 2016-08-24 MED ORDER — ASPIRIN 325 MG PO TABS
325.0000 mg | ORAL_TABLET | Freq: Every day | ORAL | Status: DC
  Administered 2016-08-25: 325 mg via ORAL
  Filled 2016-08-24: qty 1

## 2016-08-24 MED ORDER — ONDANSETRON HCL 4 MG/2ML IJ SOLN: 4.0000 mg | Freq: Three times a day (TID) | INTRAMUSCULAR | Status: DC | PRN

## 2016-08-24 MED ORDER — METOPROLOL TARTRATE 50 MG PO TABS
50.0000 mg | ORAL_TABLET | Freq: Two times a day (BID) | ORAL | Status: DC
  Administered 2016-08-25 (×2): 50 mg via ORAL
  Filled 2016-08-24: qty 1
  Filled 2016-08-24: qty 2

## 2016-08-24 MED ORDER — HYDRALAZINE HCL 50 MG PO TABS
50.0000 mg | ORAL_TABLET | Freq: Three times a day (TID) | ORAL | Status: DC
  Administered 2016-08-25 (×2): 50 mg via ORAL
  Filled 2016-08-24 (×2): qty 1

## 2016-08-24 MED ORDER — SODIUM CHLORIDE 0.9 % IV SOLN
INTRAVENOUS | Status: AC
  Administered 2016-08-24: 5.4 [IU]/h via INTRAVENOUS
  Filled 2016-08-24 (×2): qty 1

## 2016-08-24 MED ORDER — ENOXAPARIN SODIUM 40 MG/0.4ML ~~LOC~~ SOLN
40.0000 mg | SUBCUTANEOUS | Status: DC
  Administered 2016-08-25: 40 mg via SUBCUTANEOUS
  Filled 2016-08-24: qty 0.4

## 2016-08-24 MED ORDER — NICOTINE 21 MG/24HR TD PT24
21.0000 mg | MEDICATED_PATCH | Freq: Every day | TRANSDERMAL | Status: DC
  Administered 2016-08-25: 21 mg via TRANSDERMAL
  Filled 2016-08-24: qty 1

## 2016-08-24 NOTE — H&P (Addendum)
History and Physical    Dawson Broman JWJ:[id number] DOB: [birth year] DOA: 08/24/2016  Referring MD/NP/PA:   PCP: [name], NP   Patient coming from:  The patient is coming from shelter (homeless).  At baseline, pt is independent for most of ADL.  Chief Complaint: confusion  HPI: Maxamillion Dobrowski is a 56 y.o. male with medical history significant of homeless, hypertension, hyperlipidemia, diabetes mellitus, depression, anxiety, seizure, migraine headache, tobacco abuse, CKD-2, medication noncompliance, who presents with confusion.  Pt is from shelter. Per report, pt was noted to be confused and disoriented by staff in shelter. He moves all extremities. When I saw pt in ED, he is oriented to place and person, but not time. He answered some questions. He denies any chest pain, shortness breath, abdominal pain. No active nausea, vomiting, diarrhea noted. Patient denies symptoms of UTI. He has poor hygiene and smells. Per report, patient has not been taking his insulin recently.  ED Course: pt was found to have blood sugar 928, bicarbonate 23, normal anion gap, WBC 3.9, negative urinalysis for UTI, but positive for ketone 5, stable renal function, pseudohyponatremia, temperature normal, no tachycardia, oxygen saturation 99% on room air. Blood sugar elevated at 187/86. Patient is admitted to stepdown as inpatient. Insulin drip was started.  Review of Systems:   General: no fevers, chills, no body weight gain, has fatigue HEENT: no blurry vision, hearing changes or sore throat Respiratory: no dyspnea, coughing, wheezing CV: no chest pain, no palpitations GI: no nausea, vomiting, abdominal pain, diarrhea, constipation GU: no dysuria, burning on urination, increased urinary frequency, hematuria  Ext: no leg edema Neuro: no unilateral weakness, numbness, or tingling, no vision change or hearing loss. Has confusion. Skin: no rash, no skin tear. MSK: No muscle spasm, no deformity, no  limitation of range of movement in spin Heme: No easy bruising.  Travel history: No recent long distant travel.  Allergy:  Allergies  Allergen Reactions  . Ibuprofen Nausea And Vomiting and Other (See Comments)    Reaction:  Bloating   . Tylenol [Acetaminophen] Nausea And Vomiting and Other (See Comments)    Reaction:  Bloating     Past Medical History:  Diagnosis Date  . Anxiety   . Anxiety   . Chronic lower back pain   . Depression   . DKA (diabetic ketoacidoses) (HCC) 07/30/2016  . Hyperlipemia   . Hypertension   . Migraine    "last one was ~ 4 yr ago" (12/23/2014)  . Seizures (HCC)    "related to pills for anxiety; if I don't take the pills I'm suppose to take I'll have them" (12/23/2014)  . Type II diabetes mellitus (HCC)     Past Surgical History:  Procedure Laterality Date  . NO PAST SURGERIES      Social History:  reports that he has been smoking Cigarettes.  He has a 4.00 pack-year smoking history. He has never used smokeless tobacco. He reports that he drinks about 1.2 oz of alcohol per week . He reports that he does not use drugs.  Family History:  Family History  Problem Relation Age of Onset  . Diabetes Mellitus II Mother   . Diabetes Mellitus II Father      Prior to Admission medications   Medication Sig Start Date End Date Taking? Authorizing Provider  aspirin 325 MG tablet Take 1 tablet (325 mg total) by mouth daily. 08/06/16   Maxie Barb, MD  atorvastatin (LIPITOR) 40 MG tablet Take 1 tablet (40  mg total) by mouth daily at 6 PM. 08/06/16   Maxie BarbBhandari, Dron Prasad, MD  hydrALAZINE (APRESOLINE) 50 MG tablet Take 1 tablet (50 mg total) by mouth 3 (three) times daily. 07/28/16 07/28/17  Linwood DibblesKnapp, Jon, MD  insulin aspart (NOVOLOG) 100 UNIT/ML injection Inject 3 Units into the skin 3 (three) times daily with meals. Patient not taking: Reported on 07/31/2016 07/28/16   Linwood DibblesKnapp, Jon, MD  insulin aspart protamine- aspart (NOVOLOG MIX 70/30) (70-30) 100 UNIT/ML  injection Inject 0.25 mLs (25 Units total) into the skin 2 (two) times daily with a meal. 08/06/16   Maxie BarbBhandari, Dron Prasad, MD  Insulin Syringe 27G X 1/2" 0.5 ML MISC 1 Syringe by Does not apply route 5 (five) times daily. Patient not taking: Reported on 07/31/2016 06/28/16   Lonia BloodMcClung, Jeffrey T, MD  isosorbide dinitrate (ISORDIL) 20 MG tablet Take 1.5 tablets (30 mg total) by mouth 3 (three) times daily. Patient not taking: Reported on 07/28/2016 06/28/16   Lonia BloodMcClung, Jeffrey T, MD  metFORMIN (GLUCOPHAGE) 500 MG tablet Take 1 tablet (500 mg total) by mouth 2 (two) times daily with a meal. 07/28/16   Linwood DibblesKnapp, Jon, MD  metoprolol tartrate (LOPRESSOR) 50 MG tablet Take 1 tablet (50 mg total) by mouth 2 (two) times daily. 08/06/16   Maxie BarbBhandari, Dron Prasad, MD    Physical Exam: Vitals:   08/24/16 1818 08/24/16 1845 08/24/16 1915 08/24/16 1930  BP: (!) 181/83 (!) 185/82 (!) 187/86 (!) 179/90  Pulse: 75 69 70 67  Resp:  17 (!) 9 12  Temp:      SpO2: 99% 99% 100% 100%   General: Not in acute distress. Poor hygiene. HEENT:       Eyes: PERRL, EOMI, no scleral icterus.       ENT: No discharge from the ears and nose, no pharynx injection, no tonsillar enlargement.        Neck: No JVD, no bruit, no mass felt. Heme: No neck lymph node enlargement. Cardiac: S1/S2, RRR, No murmurs, No gallops or rubs. Respiratory: No rales, wheezing, rhonchi or rubs. GI: Soft, nondistended, nontender, no rebound pain, no organomegaly, BS present. GU: No hematuria Ext: No pitting leg edema bilaterally. 2+DP/PT pulse bilaterally. Musculoskeletal: No joint deformities, No joint redness or warmth, no limitation of ROM in spin. Skin: No rashes.  Neuro: confused, oriented to place and person, but not to time, cranial nerves II-XII grossly intact, moves all extremities normally. Psych: Patient is not psychotic, no suicidal or hemocidal ideation.  Labs on Admission: I have personally reviewed following labs and imaging  studies  CBC:  Recent Labs Lab 08/24/16 1805  WBC 3.9*  HGB 10.0*  HCT 31.2*  MCV 79.6  PLT 300   Basic Metabolic Panel:  Recent Labs Lab 08/24/16 1805  NA 126*  K 4.7  CL 91*  CO2 23  GLUCOSE 928*  BUN 19  CREATININE 1.15  CALCIUM 9.2   GFR: CrCl cannot be calculated (Unknown ideal weight.). Liver Function Tests: No results for input(s): AST, ALT, ALKPHOS, BILITOT, PROT, ALBUMIN in the last 168 hours. No results for input(s): LIPASE, AMYLASE in the last 168 hours. No results for input(s): AMMONIA in the last 168 hours. Coagulation Profile: No results for input(s): INR, PROTIME in the last 168 hours. Cardiac Enzymes: No results for input(s): CKTOTAL, CKMB, CKMBINDEX, TROPONINI in the last 168 hours. BNP (last 3 results) No results for input(s): PROBNP in the last 8760 hours. HbA1C: No results for input(s): HGBA1C in the last 72 hours.  CBG:  Recent Labs Lab 08/24/16 1858  GLUCAP >600*   Lipid Profile: No results for input(s): CHOL, HDL, LDLCALC, TRIG, CHOLHDL, LDLDIRECT in the last 72 hours. Thyroid Function Tests: No results for input(s): TSH, T4TOTAL, FREET4, T3FREE, THYROIDAB in the last 72 hours. Anemia Panel: No results for input(s): VITAMINB12, FOLATE, FERRITIN, TIBC, IRON, RETICCTPCT in the last 72 hours. Urine analysis:    Component Value Date/Time   COLORURINE STRAW (A) 08/24/2016 1852   APPEARANCEUR CLEAR 08/24/2016 1852   LABSPEC 1.027 08/24/2016 1852   PHURINE 5.0 08/24/2016 1852   GLUCOSEU >=500 (A) 08/24/2016 1852   HGBUR NEGATIVE 08/24/2016 1852   BILIRUBINUR NEGATIVE 08/24/2016 1852   KETONESUR 5 (A) 08/24/2016 1852   PROTEINUR NEGATIVE 08/24/2016 1852   UROBILINOGEN 1.0 02/07/2011 1605   NITRITE NEGATIVE 08/24/2016 1852   LEUKOCYTESUR NEGATIVE 08/24/2016 1852   Sepsis Labs: @LABRCNTIP (procalcitonin:4,lacticidven:4) )No results found for this or any previous visit (from the past 240 hour(s)).   Radiological Exams on  Admission: No results found.   EKG: Not done in ED, will get one.   Assessment/Plan Principal Problem:   Uncontrolled type 2 diabetes mellitus with hyperosmolar nonketotic hyperglycemia (HCC) Active Problems:   Homelessness   Tobacco abuse   Type II diabetes mellitus with renal manifestations (HCC)   Protein-calorie malnutrition, severe   Acute renal failure superimposed on stage 2 chronic kidney disease (HCC)   Acute metabolic encephalopathy   Essential hypertension  Uncontrolled type 2 diabetes mellitus with hyperosmolar nonketotic hyperglycemia (HCC): this is due to medication noncompliance. Blood sugar 928, normal anion gap, no DKA. Patient has altered mental status, but not in coma.   - Admit to stepdown as inpt - IVF: 3L NS bolus, the 150 cc/h - check CBG q1h - Zofran prn nausea  - NPO  -when blood sugar decreased to 200-250, will start long-lasting insulin and SSI  Homelessness: - consult to SW and case manager  CKD-II: stable. Baseline creatinine 1.2. His creatinine is 1.15, BUN 19. -Follow-up renal function by BMP  Tobacco abuse: -Nicotine patch  Type II diabetes mellitus with renal manifestations (HCC): Last A1c 12.4 on 07/31/16, poorly controled. Patient in on mixed insulin 70/30, and Novolog, but not been compliant to medications. -curently on insulin gtt -will consult to diabetic educator  Acute metabolic encephalopathy: likely due to hyperosmolar nonketotic hyperglycemia. -will treat hyperosmolar nonketotic hyperglycemia as above -Frequent neuro check -If blood sugar is controlled, and no improvement of mental status, will consider to get CT head  Malnutrition of severe degree: -Consult nutrition  Hypertension: Blood pressure elevated 187/86. Likely due to medication noncompliance -Continue home metoprolol, hydralazine -IV hydralazine when necessary   DVT ppx: SQ Lovenox Code Status: Full code Family Communication: None at bed side.  Disposition  Plan:  Anticipate discharge back to previous shelter Consults called:  None Admission status: SDU/inpation       Date of Service 08/24/2016    Lorretta Harp [facility] Pager [phone]  If 7PM-7AM, please contact night-coverage [email or web address] Password [email or web address] 08/24/2016, 8:12 PM

## 2016-08-24 NOTE — ED Triage Notes (Signed)
Per EMS- pt arrived to triage by ems from the shelter, where staff reports he is normally very sharp and alert and oriented. PT smells, CBG >600. Pt disoriented to situation, unable to follow commands but sitting up eating in triage. PT asked to stop eating, pt continues eating a bag of chips. bolus infusing from EMS. 18G PIv to LFA.

## 2016-08-24 NOTE — Care Management Note (Signed)
Case Management Note  Patient Details  Name: Mike [name] MRN: [id number] Date of Birth: 01-10-1961  Subjective/Objective:                 Patient presented to [facility] ED with elevated CBG >600.    Action/Plan: Patient is known to this CM patient is diabetic and chronically homeless.  Patient was Leon and given medications on discharge in hand upon discharge back in July. Patient returned to the ED the next day without medications asking for medications he was admitted with elevated CBG's. Patient was staying [facility] but due to frequent urination on the floor he was discharged. Patient is followed by [name] at the [facility].  CM met with patient at bedside he reports not knowing where his medications are. CM placed CSW consult patient will be followed by CM and CSW on unit.    Expected Discharge Date:                  Expected Discharge Plan:  Homeless Shelter  In-House Referral:     Discharge planning Services  CM Consult  Post Acute Care Choice:    Choice offered to:  Patient  DME Arranged:    DME Agency:     HH Arranged:    [place] Agency:     Status of Service:  In process, will continue to follow  If discussed at Long Length of Stay Meetings, dates discussed:    Additional CommentsLaurena Slimmer, RN 08/24/2016, 10:05 PM

## 2016-08-24 NOTE — ED Provider Notes (Addendum)
[facility]-EMERGENCY DEPT Provider Note   CSN: [id number] Arrival date & time: 08/24/16  1758  By signing my name below, I, Deland Pretty, attest that this documentation has been prepared under the direction and in the presence of resident physician Wynelle Cleveland, MD. Electronically Signed: Deland Pretty, ED Scribe. 08/24/16. 6:28 PM.  History   Chief Complaint Chief Complaint  Patient presents with  . Hyperglycemia  . Altered Mental Status   The history is provided by the patient. The history is limited by the condition of the patient. No language interpreter was used.  Hyperglycemia  Onset quality:  Gradual Duration:  6 days Progression:  Worsening Associated symptoms: altered mental status and confusion   Associated symptoms: no abdominal pain, no chest pain, no dysuria and no vomiting   Altered Mental Status   Associated symptoms include confusion and unresponsiveness. His past medical history is significant for seizures, hypertension and depression.   LEVEL 5 CAVEAT: HPI and ROS limited due to acuity of condition  HPI Comments: Mike [name] is a homeless 56 y.o. male BIB EMS, with a h/x of IDDM presents to the Emergency Department presenting with hyperglycemia and associated confusion. Per EMS, his glucose levels were over 600. Per chart, the pt has had similar symptoms on 07/30/2016. Pt states that he has not been taking insulin for the past 6 days. Per chart the pt has a PMHx of seizures, hypertension and depression. He denies dysuria, chest pain, abdominal pain, diarrhea, and vomiting.   Past Medical History:  Diagnosis Date  . Anxiety   . Anxiety   . Chronic lower back pain   . Depression   . DKA (diabetic ketoacidoses) (HCC) 07/30/2016  . Hyperlipemia   . Hypertension   . Migraine    "last one was ~ 4 yr ago" (12/23/2014)  . Seizures (HCC)    "related to pills for anxiety; if I don't take the pills I'm suppose to take I'll have them" (12/23/2014)  . Type II  diabetes mellitus [facility])     Patient Active Problem List   Diagnosis Date Noted  . Uncontrolled type 2 diabetes mellitus with hyperosmolar nonketotic hyperglycemia (HCC) 08/24/2016  . Essential hypertension 08/24/2016  . Acute ischemic stroke (HCC)   . Acute renal failure superimposed on stage 2 chronic kidney disease (HCC) 07/31/2016  . Acute metabolic encephalopathy 07/31/2016  . DKA (diabetic ketoacidoses) (HCC) 06/24/2016  . Acute kidney injury (nontraumatic) (HCC) 06/24/2016  . Protein-calorie malnutrition, severe 12/24/2014  . Chest pain, pleuritic 12/23/2014  . Homelessness 12/23/2014  . Hypertensive urgency 12/23/2014  . DM (diabetes mellitus) (HCC) 12/23/2014  . Intermittent palpitations 12/23/2014  . Tobacco abuse 12/23/2014  . Pleuritic chest pain 12/23/2014  . Abnormal EKG 12/23/2014  . Cough   . Type II diabetes mellitus with renal manifestations [facility])     Past Surgical History:  Procedure Laterality Date  . NO PAST SURGERIES         Home Medications    Prior to Admission medications   Medication Sig Start Date End Date Taking? Authorizing Provider  aspirin 325 MG tablet Take 1 tablet (325 mg total) by mouth daily. 08/06/16   Maxie Barb, MD  atorvastatin (LIPITOR) 40 MG tablet Take 1 tablet (40 mg total) by mouth daily at 6 PM. 08/06/16   Maxie Barb, MD  hydrALAZINE (APRESOLINE) 50 MG tablet Take 1 tablet (50 mg total) by mouth 3 (three) times daily. 07/28/16 07/28/17  Linwood Dibbles, MD  insulin aspart (NOVOLOG) 100 UNIT/ML  injection Inject 3 Units into the skin 3 (three) times daily with meals. Patient not taking: Reported on 07/31/2016 07/28/16   Linwood DibblesKnapp, Jon, MD  insulin aspart protamine- aspart (NOVOLOG MIX 70/30) (70-30) 100 UNIT/ML injection Inject 0.25 mLs (25 Units total) into the skin 2 (two) times daily with a meal. 08/06/16   Maxie BarbBhandari, Dron Prasad, MD  Insulin Syringe 27G X 1/2" 0.5 ML MISC 1 Syringe by Does not apply route 5 (five) times  daily. Patient not taking: Reported on 07/31/2016 06/28/16   Lonia BloodMcClung, Jeffrey T, MD  isosorbide dinitrate (ISORDIL) 20 MG tablet Take 1.5 tablets (30 mg total) by mouth 3 (three) times daily. Patient not taking: Reported on 07/28/2016 06/28/16   Lonia BloodMcClung, Jeffrey T, MD  metFORMIN (GLUCOPHAGE) 500 MG tablet Take 1 tablet (500 mg total) by mouth 2 (two) times daily with a meal. 07/28/16   Linwood DibblesKnapp, Jon, MD  metoprolol tartrate (LOPRESSOR) 50 MG tablet Take 1 tablet (50 mg total) by mouth 2 (two) times daily. 08/06/16   Maxie BarbBhandari, Dron Prasad, MD    Family History Family History  Problem Relation Age of Onset  . Diabetes Mellitus II Mother   . Diabetes Mellitus II Father     Social History Social History  Substance Use Topics  . Smoking status: Current Every Day Smoker    Packs/day: 0.10    Years: 40.00    Types: Cigarettes  . Smokeless tobacco: Never Used  . Alcohol use 1.2 oz/week    2 Cans of beer per week     Allergies   Ibuprofen and Tylenol [acetaminophen]   Review of Systems Review of Systems  Unable to perform ROS: Acuity of condition  Cardiovascular: Negative for chest pain.  Gastrointestinal: Negative for abdominal pain, diarrhea and vomiting.  Genitourinary: Negative for dysuria.  Psychiatric/Behavioral: Positive for confusion.     Physical Exam Updated Vital Signs BP (!) 155/84   Pulse 67   Temp 98.7 F (37.1 C)   Resp 15   SpO2 91%   Physical Exam  Constitutional: He is oriented to person, place, and time. He appears well-developed and well-nourished.  HENT:  Head: Normocephalic and atraumatic.  Eyes: EOM are normal.  Neck: Normal range of motion.  Cardiovascular: Normal rate, regular rhythm, normal heart sounds and intact distal pulses.   Pulmonary/Chest: Effort normal and breath sounds normal. No respiratory distress.  Abdominal: Soft. He exhibits no distension. There is no tenderness.  Musculoskeletal: Normal range of motion.  Neurological: He is alert  and oriented to person, place, and time.  Skin: Skin is warm and dry.  Psychiatric: He has a normal mood and affect. Judgment normal.  Nursing note and vitals reviewed.    ED Treatments / Results   DIAGNOSTIC STUDIES: Oxygen Saturation is 99% on RA, normal by my interpretation.   COORDINATION OF CARE: 6:18 PM-Discussed next steps with pt.   Labs (all labs ordered are listed, but only abnormal results are displayed) Labs Reviewed  BASIC METABOLIC PANEL - Abnormal; Notable for the following:       Result Value   Sodium 126 (*)    Chloride 91 (*)    Glucose, Bld 928 (*)    All other components within normal limits  CBC - Abnormal; Notable for the following:    WBC 3.9 (*)    RBC 3.92 (*)    Hemoglobin 10.0 (*)    HCT 31.2 (*)    MCH 25.5 (*)    All other components within normal limits  URINALYSIS, ROUTINE W REFLEX MICROSCOPIC - Abnormal; Notable for the following:    Color, Urine STRAW (*)    Glucose, UA >=500 (*)    Ketones, ur 5 (*)    Squamous Epithelial / LPF 0-5 (*)    All other components within normal limits  CBG MONITORING, ED - Abnormal; Notable for the following:    Glucose-Capillary >600 (*)    All other components within normal limits  I-STAT VENOUS BLOOD GAS, ED - Abnormal; Notable for the following:    pCO2, Ven 41.8 (*)    pO2, Ven 113.0 (*)    Acid-Base Excess 3.0 (*)    All other components within normal limits  CBG MONITORING, ED - Abnormal; Notable for the following:    Glucose-Capillary 470 (*)    All other components within normal limits  CBG MONITORING, ED - Abnormal; Notable for the following:    Glucose-Capillary 474 (*)    All other components within normal limits  CBG MONITORING, ED - Abnormal; Notable for the following:    Glucose-Capillary 394 (*)    All other components within normal limits  CBG MONITORING, ED - Abnormal; Notable for the following:    Glucose-Capillary 382 (*)    All other components within normal limits  ETHANOL    BLOOD GAS, VENOUS  BASIC METABOLIC PANEL  CBC  I-STAT CG4 LACTIC ACID, ED  CBG MONITORING, ED  CBG MONITORING, ED  I-STAT TROPONIN, ED    EKG  EKG Interpretation  Date/Time:  Friday August 25 2016 00:41:58 EDT Ventricular Rate:  63 PR Interval:    QRS Duration: 88 QT Interval:  441 QTC Calculation: 452 R Axis:   128 Text Interpretation:  Right and left arm electrode reversal, interpretation assumes no reversal Sinus rhythm Probable lateral infarct, age indeterminate Anterior infarct, acute (LAD) Baseline wander in lead(s) I II aVR When compared with ECG of 06/24/2016, HEART RATE has decreased T wave inversion is now present Inferior leads ST elevation in Anterior leads is slightly more pronounced Confirmed by Dione Booze ([id number]) on 08/25/2016 12:51:20 AM       Radiology No results found.  Procedures Procedures (including critical care time)  Medications Ordered in ED Medications  insulin regular (NOVOLIN R,HUMULIN R) 100 Units in sodium chloride 0.9 % 100 mL (1 Units/mL) infusion (6.7 Units/hr Intravenous Rate/Dose Change 08/25/16 0018)  sodium chloride 0.9 % bolus 1,000 mL (0 mLs Intravenous Stopped 08/24/16 2039)    And  sodium chloride 0.9 % bolus 1,000 mL (0 mLs Intravenous Stopped 08/24/16 2143)    And  0.9 %  sodium chloride infusion ( Intravenous New Bag/Given 08/24/16 2201)  aspirin tablet 325 mg (not administered)  atorvastatin (LIPITOR) tablet 40 mg (not administered)  metoprolol tartrate (LOPRESSOR) tablet 50 mg (50 mg Oral Given 08/25/16 0010)  hydrALAZINE (APRESOLINE) tablet 50 mg (50 mg Oral Given 08/25/16 0011)  hydrALAZINE (APRESOLINE) injection 5 mg (not administered)  ondansetron (ZOFRAN) injection 4 mg (not administered)  enoxaparin (LOVENOX) injection 40 mg (not administered)  nicotine (NICODERM CQ - dosed in mg/24 hours) patch 21 mg (0 mg Transdermal Hold 08/24/16 2312)  sodium chloride 0.9 % bolus 1,000 mL (0 mLs Intravenous Stopped 08/24/16 2133)      Initial Impression / Assessment and Plan / ED Course  I have reviewed the triage vital signs and the nursing notes.  Pertinent labs & imaging results that were available during my care of the patient were reviewed by me and considered in my medical decision  making (see chart for details).    Patient is a 56 year old male with history of homelessness, HTN, HLD, DM, seizures, medication noncompliance who presents with altered mental status. EMS reports blood glucose levels greater than 600. Patient arrived hemodynamically stable, in no acute distress. Exam as above.  Labs significant for hyperglycemia without acidosis, consistent with hyperosmolar hyperglycemic state. Patient admits medication noncompliance, similar to previous admissions for DKA and hyperglycemia. Low suspicion for infectious or ischemic etiology as trigger for hyperglycemia.   Patient treated with IV fluids, insulin, and discussed with hospitalist for admission.  Patient and plan of care discussed with Attending physician, Dr. Ethelda Chick.      Final Clinical Impressions(s) / ED Diagnoses   Final diagnoses:  HHNC (hyperglycemic hyperosmolar nonketotic coma) (HCC)    New Prescriptions New Prescriptions   No medications on file   I personally performed the services described in this documentation, which was scribed in my presence. The recorded information has been reviewed and is accurate.     Wynelle Cleveland, MD 08/24/16 Wanda Plump, MD 08/25/16 1610    Wynelle Cleveland, MD 08/25/16 0100    Doug Sou, MD 08/26/16 [phone]

## 2016-08-24 NOTE — Clinical Social Work Note (Signed)
Clinical Social Work Assessment  Patient Details  Name: Mike [name] MRN: [id number] Date of Birth: 1960-04-23  Date of referral:  08/24/16               Reason for consult:  [facility], Discharge Planning, Housing Concerns/Homelessness                Permission sought to share information with:  Sports coach, Magazine features editor, Family Supports Permission granted to share information::  Yes, Verbal Permission Granted  Name::        Agency::  [facility] Nursing program  Relationship::  Mike [name] (girlfriend and children's mother)  [phone]  Contact Information:     Housing/Transportation Living arrangements for the past 2 months:  Apartment, Homeless Shelter Source of Information:    Patient Interpreter Needed:  None Criminal Activity/Legal Involvement Pertinent to Current Situation/Hospitalization:  No - Comment as needed Significant Relationships:  Dependent Children, Merchandiser, retail, Media planner Lives with:  Self, Other (Comment) (living with gf and kids last three weeks) Do you feel safe going back to the place where you live?  Yes Need for family participation in patient care:  Yes (Comment) (patient confused in emergency room)  Care giving concerns:  Prior to admission patient was independent with ADLs.  Patient admitted for DKA, glucose 600.  Patient is confused and has not been taking his medication correctly.  Call placed to girlfriend Mike [name] who reports patient was recently discharged from [facility] about three weeks ago.  Reports he was urinating within the shelter and unable to care for himself, thus discharged.  Patient has been staying with girlfriend and biological children in girlfriend's housing program through [facility])  Enrichment program.  Girlfriend reports she has chronic mental health issues and has been placed in independent living with support from mental health program team.  Girlfriend reports he is not allowed to be  staying there as he is not on the lease and could incidentally have her removed as she is not following lease contract.   Girlfriend reports she is involved and has helped patient apply for public housing however he is on the Senior wait list and the wait list is over a year long.  She has also helped him with a disability lawyer, however patient does not qualify for SSI because he is not a [place] citizen.  If patient becomes a [place] citizen, then he would receive benefit.  Patient is active with [facility] and has a NP: [place] [name] [phone])  Call placed to NP to notify of admission and will follow up in the morning.  Will also see if patient can receive assistance in a new ID as he has lost his per girlfriend.     Social Worker assessment / plan:  Assessment and consult completed for homelessness.   Patient is actively homeless however on wait list for public housing.  Will follow up with medical provider at [facility] once call returned to see if patient is still active and engaged. Will assess for other resources as patient progresses on the acute unit.  Will have unit CSW follow up.  Employment status:  Unemployed Health and safety inspector:  Self Pay (Medicaid Pending) (Does qualify for SSI, however is not a [place] citizen) PT Recommendations:  Not assessed at this time Information / Referral to community resources:     Patient/Family's Response to care:  Patient appreciative of visit in ED, but overall disoriented and reports he is tired and thirsty.  Patient/Family's Understanding of  and Emotional Response to Diagnosis, Current Treatment, and Prognosis:  Patient able to verbalize reason of coming into Emergency room due to insulin and glucose.  Overall he is alert to person and situation.   Emotional Assessment Appearance:  Appears older than stated age, Disheveled Attitude/Demeanor/Rapport:  Apprehensive, Paranoid Affect (typically observed):  Anxious, Pleasant, Other (confused) Orientation:   Oriented to Self, Oriented to Place, Oriented to Situation Alcohol / Substance use:  Not Applicable Psych involvement (Current and /or in the community):  No (Comment)  Discharge Needs  Concerns to be addressed:  Care Coordination, Adjustment to Illness, Compliance Issues Concerns, Homelessness, Discharge Planning Concerns Readmission within the last 30 days:  Yes Current discharge risk:  Chronically ill, Homeless Barriers to Discharge:  Continued Medical Work up   Raye SorrowCoble, Ellinore Merced N, [name] 08/24/2016, 8:16 PM

## 2016-08-25 ENCOUNTER — Encounter (HOSPITAL_COMMUNITY): Payer: Self-pay | Admitting: Emergency Medicine

## 2016-08-25 DIAGNOSIS — E1100 Type 2 diabetes mellitus with hyperosmolarity without nonketotic hyperglycemic-hyperosmolar coma (NKHHC): Secondary | ICD-10-CM

## 2016-08-25 LAB — GLUCOSE, CAPILLARY
GLUCOSE-CAPILLARY: 99 mg/dL (ref 65–99)
GLUCOSE-CAPILLARY: 307 mg/dL — AB (ref 65–99)
Glucose-Capillary: 209 mg/dL — ABNORMAL HIGH (ref 65–99)
Glucose-Capillary: 141 mg/dL — ABNORMAL HIGH (ref 65–99)
Glucose-Capillary: 79 mg/dL (ref 65–99)
Glucose-Capillary: 65 mg/dL (ref 65–99)
Glucose-Capillary: 226 mg/dL — ABNORMAL HIGH (ref 65–99)

## 2016-08-25 LAB — CBC
HCT: 29.3 % — ABNORMAL LOW (ref 39.0–52.0)
HEMOGLOBIN: 9.4 g/dL — AB (ref 13.0–17.0)
MCH: 25.2 pg — ABNORMAL LOW (ref 26.0–34.0)
MCHC: 32.1 g/dL (ref 30.0–36.0)
MCV: 78.6 fL (ref 78.0–100.0)
Platelets: 281 10*3/uL (ref 150–400)
RBC: 3.73 MIL/uL — AB (ref 4.22–5.81)
RDW: 13.4 % (ref 11.5–15.5)
WBC: 5.5 10*3/uL (ref 4.0–10.5)

## 2016-08-25 LAB — BASIC METABOLIC PANEL
ANION GAP: 7 (ref 5–15)
BUN: 11 mg/dL (ref 6–20)
CALCIUM: 9.1 mg/dL (ref 8.9–10.3)
CO2: 29 mmol/L (ref 22–32)
Chloride: 105 mmol/L (ref 101–111)
Creatinine, Ser: 0.76 mg/dL (ref 0.61–1.24)
Glucose, Bld: 107 mg/dL — ABNORMAL HIGH (ref 65–99)
Potassium: 3.5 mmol/L (ref 3.5–5.1)
Sodium: 141 mmol/L (ref 135–145)

## 2016-08-25 LAB — CBG MONITORING, ED
GLUCOSE-CAPILLARY: 394 mg/dL — AB (ref 65–99)
GLUCOSE-CAPILLARY: 382 mg/dL — AB (ref 65–99)
Glucose-Capillary: 299 mg/dL — ABNORMAL HIGH (ref 65–99)

## 2016-08-25 LAB — I-STAT TROPONIN, ED: TROPONIN I, POC: 0.07 ng/mL (ref 0.00–0.08)

## 2016-08-25 LAB — MRSA PCR SCREENING: MRSA BY PCR: NEGATIVE

## 2016-08-25 MED ORDER — INSULIN ASPART 100 UNIT/ML ~~LOC~~ SOLN
0.0000 [IU] | Freq: Three times a day (TID) | SUBCUTANEOUS | Status: DC
  Administered 2016-08-25: 7 [IU] via SUBCUTANEOUS
  Administered 2016-08-25: 3 [IU] via SUBCUTANEOUS

## 2016-08-25 MED ORDER — DEXTROSE 50 % IV SOLN: 50.0000 mL | INTRAVENOUS | Status: DC | PRN

## 2016-08-25 MED ORDER — INSULIN ASPART PROT & ASPART (70-30 MIX) 100 UNIT/ML ~~LOC~~ SUSP: 17.0000 [IU] | Freq: Two times a day (BID) | SUBCUTANEOUS | Status: DC

## 2016-08-25 MED ORDER — INSULIN ASPART PROT & ASPART (70-30 MIX) 100 UNIT/ML ~~LOC~~ SUSP: 25.0000 [IU] | Freq: Two times a day (BID) | SUBCUTANEOUS | Status: DC

## 2016-08-25 MED ORDER — INSULIN GLARGINE 100 UNIT/ML ~~LOC~~ SOLN
17.0000 [IU] | Freq: Every day | SUBCUTANEOUS | Status: DC
  Administered 2016-08-25: 17 [IU] via SUBCUTANEOUS
  Filled 2016-08-25: qty 0.17

## 2016-08-25 NOTE — Care Management Note (Addendum)
Case Management Note  Patient Details  Name: Mike [name] MRN: [id number] Date of Birth: 1960-03-20  Subjective/Objective:    [facility], CSW referral,  Pt uses [facility] as his PCP- [name] is the NP that follows him. Pt should be able to get medications from the Health Dept. Pt is without insurance and is homeless. CSW is following for shelters/ housing resources.  He is for dc today and needs a bus pass. NCM contacted Chales [facility] Ann [name] , she is in [place]  , but NCM told her what meds he is being discharged on and she will make sure they will have them at the Health dept for patient.  NCM [facility], and she states his meds are ready waiting for him to pick up.                              Action/Plan: NCM will follow for dc needs.   Expected Discharge Date:  08/25/16               Expected Discharge Plan:  Homeless Shelter  In-House Referral:  Clinical Social Work  Discharge planning Services  CM Consult  Post Acute Care Choice:    Choice offered to:  Patient  DME Arranged:    DME Agency:     HH Arranged:    HH Agency:     Status of Service:  Completed, signed off  If discussed at [employer] Meetings, dates discussed:    Additional Comments:  [name], Rudolf Blizard Clinton, RN 08/25/2016, 10:50 AM

## 2016-08-25 NOTE — Discharge Summary (Signed)
Discharge Summary  Mike [name] ZHY:[id number] DOB: 1960-04-03  PCP: Mike Sharps, NP  Admit date: 08/24/2016 Discharge date: 08/25/2016  Time spent: >67mins, more than 50% time spent on patient education/counseling and coordination of care, case discussed with case manager, social worker and diabetes coordinator.   Recommendations for Outpatient Follow-up:  1. F/u with PMD within a week  for hospital discharge follow up, repeat cbc/bmp at follow up 2. F/u with health department for meds 3. Social worker is working on getting patient back to local shelter/[facility]  Discharge Diagnoses:  Active Hospital Problems   Diagnosis Date Noted  . Uncontrolled type 2 diabetes mellitus with hyperosmolar nonketotic hyperglycemia (HCC) 08/24/2016  . Essential hypertension 08/24/2016  . Acute metabolic encephalopathy 07/31/2016  . Acute renal failure superimposed on stage 2 chronic kidney disease (HCC) 07/31/2016  . Protein-calorie malnutrition, severe 12/24/2014  . Homelessness 12/23/2014  . Tobacco abuse 12/23/2014  . Type II diabetes mellitus with renal manifestations [facility])     Resolved Hospital Problems   Diagnosis Date Noted Date Resolved  No resolved problems to display.    Discharge Condition: stable  Diet recommendation: heart healthy/carb modified  Filed Weights   08/25/16 1210  Weight: 65.8 kg (145 lb 1 oz)    History of present illness:  PCP: Mike Sharps, NP   Patient coming from:  The patient is coming from shelter (homeless).  At baseline, pt is independent for most of ADL.  Chief Complaint: confusion  HPI: Mike [name] is a 56 y.o. male with medical history significant of homeless, hypertension, hyperlipidemia, diabetes mellitus, depression, anxiety, seizure, migraine headache, tobacco abuse, CKD-2, medication noncompliance, who presents with confusion.  Pt is from shelter. Per report, pt was noted to be confused and disoriented by staff in shelter. He  moves all extremities. When I saw pt in ED, he is oriented to place and person, but not time. He answered some questions. He denies any chest pain, shortness breath, abdominal pain. No active nausea, vomiting, diarrhea noted. Patient denies symptoms of UTI. He has poor hygiene and smells. Per report, patient has not been taking his insulin recently.  ED Course: pt was found to have blood sugar 928, bicarbonate 23, normal anion gap, WBC 3.9, negative urinalysis for UTI, but positive for ketone 5, stable renal function, pseudohyponatremia, temperature normal, no tachycardia, oxygen saturation 99% on room air. Blood sugar elevated at 187/86. Patient is admitted to stepdown as inpatient. Insulin drip was started.  Hospital Course:  Principal Problem:   Uncontrolled type 2 diabetes mellitus with hyperosmolar nonketotic hyperglycemia (HCC) Active Problems:   Homelessness   Tobacco abuse   Type II diabetes mellitus with renal manifestations (HCC)   Protein-calorie malnutrition, severe   Acute renal failure superimposed on stage 2 chronic kidney disease (HCC)   Acute metabolic encephalopathy   Essential hypertension   Uncontrolled type 2 diabetes mellitus with hyperosmolar nonketotic hyperglycemia (HCC): this is due to medication noncompliance. Blood sugar 928 on admission, normal anion gap, no DKA. Patient has altered mental status, but not in coma.  - he is Admitted to stepdown as inpt, received ivf and insulin drip, blood sugar has improved, he is transitioned back to sub q insulin.    Acute metabolic encephalopathy: likely due to hyperosmolar nonketotic hyperglycemia. -resolved after blood sugar correction    Type II diabetes mellitus with renal manifestations (HCC): Last A1c 12.4 on 07/31/16, poorly controled. Patient in on mixed insulin 70/30, and Novolog, but not been compliant to medications. -  diabetic Surveyor, quantityeducator/case manager and social worker consulted, patient is to get free meds  from health department  Hypertension: Blood pressure elevated 187/86. Likely due to medication noncompliance -blood pressure improved after resuming  home metoprolol, hydralazine   CKD-II: stable. Baseline creatinine 1.2. His creatinine is 1.15, BUN 19.  Malnutrition of severe degree: -evidence of fat depletion and muscle wasting  Body mass index is 17.66 kg/m.  Encourage nutrition intake.   Tobacco abuse: -Nicotine patch  Homelessness: - consult to SW and case manager -detail please refer to sw and case manager notes.    Code Status: Full code Family Communication: None at bed side.  Disposition Plan:  Anticipate discharge back to previous shelter Consults called: Child psychotherapistsocial worker, case Production designer, theatre/television/filmmanager, diabetes coordinator.   Procedures:  none    Discharge Exam: BP 128/89   Pulse (!) 56   Temp 97.6 F (36.4 C) (Oral)   Resp 17   Ht 6\' 4"  (1.93 m)   Wt 65.8 kg (145 lb 1 oz)   SpO2 100%   BMI 17.66 kg/m   General: NAD, aaox3, unkept, malnourished  Cardiovascular: RRR Respiratory: CTABL  Discharge Instructions You were cared for by a hospitalist during your hospital stay. If you have any questions about your discharge medications or the care you received while you were in the hospital after you are discharged, you can call the unit and asked to speak with the hospitalist on call if the hospitalist that took care of you is not available. Once you are discharged, your primary care physician will handle any further medical issues. Please note that NO REFILLS for any discharge medications will be authorized once you are discharged, as it is imperative that you return to your primary care physician (or establish a relationship with a primary care physician if you do not have one) for your aftercare needs so that they can reassess your need for medications and monitor your lab values.  Discharge Instructions    Diet - low sodium heart healthy    Complete by:  As directed      Carb modified   Increase activity slowly    Complete by:  As directed      Allergies as of 08/25/2016      Reactions   Ibuprofen Nausea And Vomiting, Other (See Comments)   Reaction:  Bloating    Tylenol [acetaminophen] Nausea And Vomiting, Other (See Comments)   Reaction:  Bloating       Medication List    TAKE these medications   aspirin 325 MG tablet Take 1 tablet (325 mg total) by mouth daily.   atorvastatin 40 MG tablet Commonly known as:  LIPITOR Take 1 tablet (40 mg total) by mouth daily at 6 PM.   hydrALAZINE 50 MG tablet Commonly known as:  APRESOLINE Take 1 tablet (50 mg total) by mouth 3 (three) times daily.   insulin aspart 100 UNIT/ML injection Commonly known as:  novoLOG Inject 3 Units into the skin 3 (three) times daily with meals.   insulin aspart protamine- aspart (70-30) 100 UNIT/ML injection Commonly known as:  NOVOLOG MIX 70/30 Inject 0.25 mLs (25 Units total) into the skin 2 (two) times daily with a meal.   isosorbide dinitrate 20 MG tablet Commonly known as:  ISORDIL Take 1.5 tablets (30 mg total) by mouth 3 (three) times daily.   metFORMIN 500 MG tablet Commonly known as:  GLUCOPHAGE Take 1 tablet (500 mg total) by mouth 2 (two) times daily with a meal.  metoprolol tartrate 50 MG tablet Commonly known as:  LOPRESSOR Take 1 tablet (50 mg total) by mouth 2 (two) times daily.      Allergies  Allergen Reactions  . Ibuprofen Nausea And Vomiting and Other (See Comments)    Reaction:  Bloating   . Tylenol [Acetaminophen] Nausea And Vomiting and Other (See Comments)    Reaction:  Bloating    Follow-up Information    [name], [name], NP. Schedule an appointment as soon as possible for a visit on 08/28/2016.   Why:  hospital discharge follow up, please continue to work with your docotor for diabetes control.  (Patient needs to call on Saturday, 08/26/2016, and make the appointment  when [name] or her staff is there.)  you will need  to go to  [facility] on Monday 8/13 Contact information: [address] [place] [address] [phone]            The results of significant diagnostics from this hospitalization (including imaging, microbiology, ancillary and laboratory) are listed below for reference.    Significant Diagnostic Studies: Ct Head Wo Contrast  Result Date: 07/31/2016 CLINICAL DATA:  56 year old male with history of diabetes and seizure presenting with acute encephalopathy. EXAM: CT HEAD WITHOUT CONTRAST TECHNIQUE: Contiguous axial images were obtained from the base of the skull through the vertex without intravenous contrast. COMPARISON:  Head CT dated 06/24/2016 FINDINGS: Brain: There is mild diffuse volume loss. Moderate periventricular and deep white matter hypodensities as seen on the prior CT may represent chronic microvascular ischemic changes or related to underlying demyelinating disease. Clinical correlation is recommended. There is no acute intracranial hemorrhage. No mass effect or midline shift noted. No extra-axial fluid collection. Vascular: No hyperdense vessel or unexpected calcification. Skull: Normal. Negative for fracture or focal lesion. Sinuses/Orbits: Small right maxillary sinus retention cyst or polyp. The visualized paranasal sinuses and mastoid air cells are otherwise clear. The globes are intact. Other: None IMPRESSION: 1. No acute intracranial hemorrhage. 2. Stable global atrophy and white matter hypodensities as described. If symptoms persist, and there are no contraindications, MRI may provide better evaluation if clinically indicated. Electronically Signed   By: Elgie Collard M.D.   On: 07/31/2016 05:59   Mr Maxine Glenn Head Wo Contrast  Result Date: 08/05/2016 CLINICAL DATA:  Stroke follow-up. EXAM: MRA HEAD WITHOUT CONTRAST TECHNIQUE: Angiographic images of the Circle of Willis were obtained using MRA technique without intravenous contrast. COMPARISON:  Brain MRI from 2 days prior FINDINGS:  Symmetric carotid and vertebral arteries. Incomplete circle-of-Willis. No major branch occlusion or flow limiting stenosis. Negative for beading, aneurysm or vascular malformation. IMPRESSION: Negative intracranial MRA. Electronically Signed   By: Marnee Spring M.D.   On: 08/05/2016 09:24   [name]  Result Date: 08/03/2016 CLINICAL DATA:  56 y/o  M; altered mental status.  DKA. EXAM: MRI HEAD WITHOUT CONTRAST TECHNIQUE: Multiplanar, multiecho pulse sequences of the brain and surrounding structures were obtained without intravenous contrast. COMPARISON:  07/31/2016 CT head. FINDINGS: Brain: There multiple punctate foci of diffusion hyperintensity with variable ADC throughout subcortical white matter in the frontal and parietal lobes compatible with areas of acute/ early subacute ischemia. Additionally, there is reduced diffusion throughout the splenium of corpus callosum without mass effect. There is extensive confluent T2 FLAIR hyperintense signal abnormality throughout the supratentorial subcortical and periventricular white matter compatible with severe chronic microvascular ischemic changes. Severe brain parenchymal volume loss. There multiple chronic infarctions in periventricular white matter and chronic lacunar infarcts  within bilateral lentiform nuclei in the right caudate body. No intracranial hemorrhage, focal mass effect, extra-axial collection, or hydrocephalus. Vascular: Normal flow voids. Skull and upper cervical spine: Normal marrow signal. Sinuses/Orbits: Right maxillary sinus mucous retention cysts. Trace right mastoid effusion. Normal orbits. Other: None. IMPRESSION: 1. Multiple punctate foci of diffusion hyperintensity with variable ADC throughout subcortical white matter of the frontal and parietal lobes compatible with multiple microvascular infarcts of varying ages. 2. Mildly reduced diffusion within splenium of corpus callosum without mass effect. Given patient's history this  is probably related to metabolic derangement. There is a broad differential, for example ischemia, seizure, drug toxicity (antiepileptic, metronidazole, sympathomimetic.), ETOH encephalopathy, or hypoglycemia. 3. Severe chronic microvascular ischemic changes, numerous small chronic infarcts, and severe parenchymal volume loss. These results will be called to the ordering clinician or representative by the Radiologist Assistant, and communication documented in the PACS or zVision Dashboard. Electronically Signed   By: Mitzi Hansen M.D.   On: 08/03/2016 22:01   Dg Chest Portable 1 View  Result Date: 07/28/2016 CLINICAL DATA:  Vomiting, confusion. EXAM: PORTABLE CHEST 1 VIEW COMPARISON:  Radiograph of June 24, 2016. FINDINGS: The heart size and mediastinal contours are within normal limits. Both lungs are clear. No pneumothorax or pleural effusion is noted. The visualized skeletal structures are unremarkable. IMPRESSION: No acute cardiopulmonary abnormality seen. Electronically Signed   By: Lupita Raider, M.D.   On: 07/28/2016 15:23    Microbiology: Recent Results (from the past 240 hour(s))  MRSA PCR Screening     Status: None   Collection Time: 08/25/16  2:41 AM  Result Value Ref Range Status   MRSA by PCR NEGATIVE NEGATIVE Final    Comment:        The GeneXpert MRSA Assay (FDA approved for NASAL specimens only), is one component of a comprehensive MRSA colonization surveillance program. It is not intended to diagnose MRSA infection nor to guide or monitor treatment for MRSA infections.      Labs: Basic Metabolic Panel:  Recent Labs Lab 08/24/16 1805 08/25/16 0402  NA 126* 141  K 4.7 3.5  CL 91* 105  CO2 23 29  GLUCOSE 928* 107*  BUN 19 11  CREATININE 1.15 0.76  CALCIUM 9.2 9.1   Liver Function Tests: No results for input(s): AST, ALT, ALKPHOS, BILITOT, PROT, ALBUMIN in the last 168 hours. No results for input(s): LIPASE, AMYLASE in the last 168 hours. No  results for input(s): AMMONIA in the last 168 hours. CBC:  Recent Labs Lab 08/24/16 1805 08/25/16 0402  WBC 3.9* 5.5  HGB 10.0* 9.4*  HCT 31.2* 29.3*  MCV 79.6 78.6  PLT 300 281   Cardiac Enzymes: No results for input(s): CKTOTAL, CKMB, CKMBINDEX, TROPONINI in the last 168 hours. BNP: BNP (last 3 results) No results for input(s): BNP in the last 8760 hours.  ProBNP (last 3 results) No results for input(s): PROBNP in the last 8760 hours.  CBG:  Recent Labs Lab 08/25/16 0454 08/25/16 0615 08/25/16 0656 08/25/16 0816 08/25/16 1344  GLUCAP 79 65 99 226* 307*       Signed:  Erionna Strum MD, PhD  [facility] 08/25/2016, 10:27 PM

## 2016-08-25 NOTE — Progress Notes (Signed)
CSW received call from MD stating patient stable for DC today but needs DC plan to ensure insulin is kept refrigerated.  CS[facility]- they state they do not anticipate having availability until Tuesday of next week- they can not store patients insulin unless patient is a resident with them.  [facility] there for patient- she states that pt had first appointment with her a few days ago and that girlfriend was present- thinks that girlfriend would be agreeable to take patient back for a few days.  CSW spoke with [place] with patients permission- she stated she would be willing to take patient back for few days until [facility].  Mike [name] unable to pick patient up- patient agreeable to taking bus to get home  CSW updated MD who plans to DC patient today.  CSW informed RN and placed bus passes on chart for when pt is ready for DC.  CSW signing off  Burna SisJenna H. Kalilah Barua, [name] Clinical Social Worker [phone]

## 2016-08-25 NOTE — Progress Notes (Signed)
Hypoglycemic Event  CBG: 65  Treatment: carb mod diet. 4 oz orange juice  Symptoms: none  Follow-up CBG: Time: 0655 CBG Result: 99  Possible Reasons for Event: Insulin drip and Lantus w/ no diet on board  Comments/MD notified: Clyde LundborgNiu, MD notified. Carb modified diet ordered.     Mike [name]

## 2016-08-25 NOTE — Progress Notes (Signed)
Spoke with patient about his diabetes and insulin.  States that he has had diabetes for about 20 years. States that he takes 70/30 insulin 20 units or 30 units once a day (not quite sure about dosage) and gets his insulin at the "family center" on [address]. States that he had not taken any insulin for about 2-3 days because he didn't know where it was. Asked patient where he usually gives his insulin and responded that he gives in stomach, legs, and "hands" ( he pointed to his upper arms).  [facility]. Says that he checks blood sugars and they usually run in the 200's. Patient somewhat hard to understand and follow his conversation. Had to repeat questions. Will need to make sure patient has adequate ways of getting insulin for his diabetes at discharge per social work.   Smith MinceKendra Bassem Bernasconi RN BSN CDE Diabetes Coordinator Pager: [phone]  8am-5pm

## 2016-08-26 NOTE — ED Provider Notes (Signed)
Patient has extremely vague historian complains of feeling ill. Does not answer all questions. On exam alert follows simple commands, moves all extremities HEENT exam mucus membranes dry neck supple lungs clear auscultation heart regular rate and rhythm, bradycardic abdomen nondistended nontender. Neurologic moves all extremities. Commands cranial nerves II through XII grossly intact. Patient noted to be markedly hyperglycemic. He reportedly has been noncompliant with insulin   Doug SouJacubowitz, Samreen Seltzer, MD 08/26/16 [phone]

## 2016-10-09 ENCOUNTER — Encounter (HOSPITAL_COMMUNITY): Payer: Self-pay | Admitting: Emergency Medicine

## 2016-10-09 ENCOUNTER — Emergency Department (HOSPITAL_COMMUNITY)
Admission: EM | Admit: 2016-10-09 | Discharge: 2016-10-10 | Disposition: A | Discharge status: Home / Self Care | Payer: Self-pay | Attending: Emergency Medicine | Admitting: Emergency Medicine

## 2016-10-09 DIAGNOSIS — F17210 Nicotine dependence, cigarettes, uncomplicated: Secondary | ICD-10-CM | POA: Insufficient documentation

## 2016-10-09 DIAGNOSIS — Z794 Long term (current) use of insulin: Secondary | ICD-10-CM | POA: Insufficient documentation

## 2016-10-09 DIAGNOSIS — R739 Hyperglycemia, unspecified: Secondary | ICD-10-CM

## 2016-10-09 DIAGNOSIS — Z7982 Long term (current) use of aspirin: Secondary | ICD-10-CM | POA: Insufficient documentation

## 2016-10-09 DIAGNOSIS — Z79899 Other long term (current) drug therapy: Secondary | ICD-10-CM | POA: Insufficient documentation

## 2016-10-09 DIAGNOSIS — Z9114 Patient's other noncompliance with medication regimen: Secondary | ICD-10-CM | POA: Insufficient documentation

## 2016-10-09 DIAGNOSIS — E1165 Type 2 diabetes mellitus with hyperglycemia: Secondary | ICD-10-CM | POA: Insufficient documentation

## 2016-10-09 LAB — BASIC METABOLIC PANEL
ANION GAP: 11 (ref 5–15)
BUN: 19 mg/dL (ref 6–20)
CALCIUM: 9.9 mg/dL (ref 8.9–10.3)
CO2: 29 mmol/L (ref 22–32)
Chloride: 92 mmol/L — ABNORMAL LOW (ref 101–111)
Creatinine, Ser: 1.11 mg/dL (ref 0.61–1.24)
GFR calc non Af Amer: >60 mL/min (ref >60)
Glucose, Bld: 354 mg/dL — ABNORMAL HIGH (ref 65–99)
Potassium: 3.6 mmol/L (ref 3.5–5.1)
Sodium: 132 mmol/L — ABNORMAL LOW (ref 135–145)

## 2016-10-09 LAB — CBC
HCT: 32 % — ABNORMAL LOW (ref 39.0–52.0)
HEMOGLOBIN: 10.2 g/dL — AB (ref 13.0–17.0)
MCH: 24.5 pg — ABNORMAL LOW (ref 26.0–34.0)
MCHC: 31.9 g/dL (ref 30.0–36.0)
MCV: 76.7 fL — ABNORMAL LOW (ref 78.0–100.0)
Platelets: 385 10*3/uL (ref 150–400)
RBC: 4.17 MIL/uL — AB (ref 4.22–5.81)
RDW: 12.5 % (ref 11.5–15.5)
WBC: 6.6 10*3/uL (ref 4.0–10.5)

## 2016-10-09 LAB — CBG MONITORING, ED
GLUCOSE-CAPILLARY: 404 mg/dL — AB (ref 65–99)
Glucose-Capillary: 122 mg/dL — ABNORMAL HIGH (ref 65–99)

## 2016-10-09 MED ORDER — SODIUM CHLORIDE 0.9 % IV BOLUS (SEPSIS)
1000.0000 mL | Freq: Once | INTRAVENOUS | Status: AC
  Administered 2016-10-09: 1000 mL via INTRAVENOUS

## 2016-10-09 NOTE — ED Triage Notes (Addendum)
Per EMS, pt from ex-girlfriend's house. Pt is homeless. Pt hasn't taken insulin in 3 days d/t not having syringes and out of his medication. Per EMS, glucometer read HIGH. EMS gave 100cc fluid. BP 178/102, HR 74,  SpO2 98% RA, 16 R. Pt denies pain. Hx HTN also in which he is not taking his medications.

## 2016-10-09 NOTE — ED Notes (Signed)
Pt has not left the room., this RN assisted pt with clothes he did not want to wear paper scrubs. Pt notified that the room was needed for other patients.

## 2016-10-09 NOTE — ED Provider Notes (Signed)
[facility]-EMERGENCY DEPT Provider Note   CSN: [id number] Arrival date & time: 10/09/16  1944     History   Chief Complaint Chief Complaint  Patient presents with  . Hyperglycemia    HPI Mike [name] is a 56 y.o. male.  The history is provided by the patient. No language interpreter was used.    Mike [name] is a 56 y.o. male who presents to the Emergency Department complaining of hyperglycemia. His girlfriend called 911 due to elevated blood sugar.  He denies any complaints.  He has not taken his insulin for the last three days.  He hasn't taken it because he is out of syringes. He takes 70/30.    Past Medical History:  Diagnosis Date  . Anxiety   . Anxiety   . Chronic lower back pain   . Depression   . DKA (diabetic ketoacidoses) (HCC) 07/30/2016  . Hyperlipemia   . Hypertension   . Migraine    "last one was ~ 4 yr ago" (12/23/2014)  . Seizures (HCC)    "related to pills for anxiety; if I don't take the pills I'm suppose to take I'll have them" (12/23/2014)  . Type II diabetes mellitus [facility])     Patient Active Problem List   Diagnosis Date Noted  . Uncontrolled type 2 diabetes mellitus with hyperosmolar nonketotic hyperglycemia (HCC) 08/24/2016  . Essential hypertension 08/24/2016  . Acute ischemic stroke (HCC)   . Acute renal failure superimposed on stage 2 chronic kidney disease (HCC) 07/31/2016  . Acute metabolic encephalopathy 07/31/2016  . DKA (diabetic ketoacidoses) (HCC) 06/24/2016  . Acute kidney injury (nontraumatic) (HCC) 06/24/2016  . Protein-calorie malnutrition, severe 12/24/2014  . Chest pain, pleuritic 12/23/2014  . Homelessness 12/23/2014  . Hypertensive urgency 12/23/2014  . DM (diabetes mellitus) (HCC) 12/23/2014  . Intermittent palpitations 12/23/2014  . Tobacco abuse 12/23/2014  . Pleuritic chest pain 12/23/2014  . Abnormal EKG 12/23/2014  . Cough   . Type II diabetes mellitus with renal manifestations [facility])     Past Surgical History:    Procedure Laterality Date  . NO PAST SURGERIES         Home Medications    Prior to Admission medications   Medication Sig Start Date End Date Taking? Authorizing Provider  aspirin 325 MG tablet Take 1 tablet (325 mg total) by mouth daily. 08/06/16  Yes Maxie Barb, MD  atorvastatin (LIPITOR) 40 MG tablet Take 1 tablet (40 mg total) by mouth daily at 6 PM. Patient taking differently: Take 40 mg by mouth daily.  08/06/16  Yes Maxie Barb, MD  hydrALAZINE (APRESOLINE) 50 MG tablet Take 1 tablet (50 mg total) by mouth 3 (three) times daily. Patient taking differently: Take 50 mg by mouth 2 (two) times daily as needed (high blood pressure).  07/28/16 07/28/17 Yes Linwood Dibbles, MD  insulin NPH-regular Human (NOVOLIN 70/30) (70-30) 100 UNIT/ML injection Inject 30 Units into the skin daily with breakfast.   Yes [provider]  metFORMIN (GLUCOPHAGE) 500 MG tablet Take 1 tablet (500 mg total) by mouth 2 (two) times daily with a meal. 07/28/16  Yes Linwood Dibbles, MD  insulin aspart (NOVOLOG) 100 UNIT/ML injection Inject 3 Units into the skin 3 (three) times daily with meals. Patient not taking: Reported on 07/31/2016 07/28/16   Linwood Dibbles, MD  insulin aspart protamine- aspart (NOVOLOG MIX 70/30) (70-30) 100 UNIT/ML injection Inject 0.25 mLs (25 Units total) into the skin 2 (two) times daily with a meal. Patient not taking:  Reported on 10/09/2016 08/06/16   Rosita Fire, MD  isosorbide dinitrate (ISORDIL) 20 MG tablet Take 1.5 tablets (30 mg total) by mouth 3 (three) times daily. Patient not taking: Reported on 07/28/2016 06/28/16   Cherene Altes, MD  metoprolol tartrate (LOPRESSOR) 25 MG tablet Take 25 mg by mouth 2 (two) times daily. 07/28/16   [provider]  metoprolol tartrate (LOPRESSOR) 50 MG tablet Take 1 tablet (50 mg total) by mouth 2 (two) times daily. 08/06/16   Rosita Fire, MD    Family History Family History  Problem Relation Age of  Onset  . Diabetes Mellitus II Mother   . Diabetes Mellitus II Father     Social History Social History  Substance Use Topics  . Smoking status: Current Every Day Smoker    Packs/day: 0.10    Years: 40.00    Types: Cigarettes  . Smokeless tobacco: Never Used  . Alcohol use 1.2 oz/week    2 Cans of beer per week     Allergies   Ibuprofen and Tylenol [acetaminophen]   Review of Systems Review of Systems  All other systems reviewed and are negative.    Physical Exam Updated Vital Signs BP (!) 152/79   Pulse 72   Temp 98.1 F (36.7 C) (Oral)   Resp 18   Ht _0  (1.93 m)   Wt 63.5 kg (140 lb)   SpO2 99%   BMI 17.04 kg/m   Physical Exam  Constitutional: He is oriented to person, place, and time. He appears well-developed and well-nourished.  Disheveled  HENT:  Head: Normocephalic and atraumatic.  Cardiovascular: Normal rate and regular rhythm.   No murmur heard. Pulmonary/Chest: Effort normal and breath sounds normal. No respiratory distress.  Abdominal: Soft. There is no tenderness. There is no rebound and no guarding.  Musculoskeletal: He exhibits no edema or tenderness.  Neurological: He is alert and oriented to person, place, and time.  Skin: Skin is warm and dry.  Psychiatric: He has a normal mood and affect. His behavior is normal.  Nursing note and vitals reviewed.    ED Treatments / Results  Labs (all labs ordered are listed, but only abnormal results are displayed) Labs Reviewed  BASIC METABOLIC PANEL - Abnormal; Notable for the following:       Result Value   Sodium 132 (*)    Chloride 92 (*)    Glucose, Bld 354 (*)    All other components within normal limits  CBC - Abnormal; Notable for the following:    RBC 4.17 (*)    Hemoglobin 10.2 (*)    HCT 32.0 (*)    MCV 76.7 (*)    MCH 24.5 (*)    All other components within normal limits  CBG MONITORING, ED - Abnormal; Notable for the following:    Glucose-Capillary 404 (*)    All other  components within normal limits  CBG MONITORING, ED - Abnormal; Notable for the following:    Glucose-Capillary 122 (*)    All other components within normal limits  URINALYSIS, ROUTINE W REFLEX MICROSCOPIC    EKG  EKG Interpretation None       Radiology No results found.  Procedures Procedures (including critical care time)  Medications Ordered in ED Medications  sodium chloride 0.9 % bolus 1,000 mL (0 mLs Intravenous Stopped 10/09/16 2159)     Initial Impression / Assessment and Plan / ED Course  I have reviewed the triage vital signs and the nursing  notes.  Pertinent labs & imaging results that were available during my care of the patient were reviewed by me and considered in my medical decision making (see chart for details).     Patient is homeless, presenting for hyperglycemia. He states that his girlfriend called 911 because he she was worried about his blood sugar. He denies any complaints in the emergency department. He states that he has not been taking his insulin because he does not have the syringes for it. He is nontoxic appearing on examination but disheveled with poor hygiene. There is no evidence of acute infectious process. He does have hyperglycemia that improved after IV fluid administration. No evidence of DKA. He met with social work, who provided resources and materials for medications. Plan to DC home with outpatient follow up and return precautions.  Final Clinical Impressions(s) / ED Diagnoses   Final diagnoses:  Hyperglycemia  Noncompliance with medication regimen    New Prescriptions New Prescriptions   No medications on file     Quintella Reichert, MD 10/09/16 2326

## 2016-10-10 NOTE — ED Notes (Signed)
Pt noted to be fully dressed and laying in the bed, security called to assist pt to lobby

## 2016-10-22 ENCOUNTER — Inpatient Hospital Stay (HOSPITAL_COMMUNITY): Payer: Self-pay

## 2016-10-22 ENCOUNTER — Inpatient Hospital Stay (HOSPITAL_COMMUNITY)
Admission: EM | Admit: 2016-10-22 | Discharge: 2016-10-27 | DRG: 637 | Disposition: A | Discharge status: Home / Self Care | Payer: Self-pay | Attending: Internal Medicine | Admitting: Internal Medicine

## 2016-10-22 ENCOUNTER — Encounter (HOSPITAL_COMMUNITY): Payer: Self-pay | Admitting: Emergency Medicine

## 2016-10-22 DIAGNOSIS — E1122 Type 2 diabetes mellitus with diabetic chronic kidney disease: Secondary | ICD-10-CM | POA: Diagnosis present

## 2016-10-22 DIAGNOSIS — Z8673 Personal history of transient ischemic attack (TIA), and cerebral infarction without residual deficits: Secondary | ICD-10-CM

## 2016-10-22 DIAGNOSIS — I160 Hypertensive urgency: Secondary | ICD-10-CM | POA: Diagnosis present

## 2016-10-22 DIAGNOSIS — E43 Unspecified severe protein-calorie malnutrition: Secondary | ICD-10-CM | POA: Diagnosis present

## 2016-10-22 DIAGNOSIS — Z720 Tobacco use: Secondary | ICD-10-CM | POA: Diagnosis present

## 2016-10-22 DIAGNOSIS — Z5900 Homelessness unspecified: Secondary | ICD-10-CM

## 2016-10-22 DIAGNOSIS — R4189 Other symptoms and signs involving cognitive functions and awareness: Secondary | ICD-10-CM

## 2016-10-22 DIAGNOSIS — N183 Chronic kidney disease, stage 3 (moderate): Secondary | ICD-10-CM | POA: Diagnosis present

## 2016-10-22 DIAGNOSIS — G9341 Metabolic encephalopathy: Secondary | ICD-10-CM | POA: Diagnosis present

## 2016-10-22 DIAGNOSIS — E1129 Type 2 diabetes mellitus with other diabetic kidney complication: Secondary | ICD-10-CM | POA: Diagnosis present

## 2016-10-22 DIAGNOSIS — I10 Essential (primary) hypertension: Secondary | ICD-10-CM | POA: Diagnosis present

## 2016-10-22 DIAGNOSIS — Z794 Long term (current) use of insulin: Secondary | ICD-10-CM

## 2016-10-22 DIAGNOSIS — F17213 Nicotine dependence, cigarettes, with withdrawal: Secondary | ICD-10-CM | POA: Diagnosis present

## 2016-10-22 DIAGNOSIS — G319 Degenerative disease of nervous system, unspecified: Secondary | ICD-10-CM | POA: Diagnosis present

## 2016-10-22 DIAGNOSIS — Z681 Body mass index (BMI) 19 or less, adult: Secondary | ICD-10-CM

## 2016-10-22 DIAGNOSIS — E1110 Type 2 diabetes mellitus with ketoacidosis without coma: Principal | ICD-10-CM | POA: Diagnosis present

## 2016-10-22 DIAGNOSIS — N189 Chronic kidney disease, unspecified: Secondary | ICD-10-CM

## 2016-10-22 DIAGNOSIS — Z9114 Patient's other noncompliance with medication regimen: Secondary | ICD-10-CM

## 2016-10-22 DIAGNOSIS — Z833 Family history of diabetes mellitus: Secondary | ICD-10-CM

## 2016-10-22 DIAGNOSIS — I129 Hypertensive chronic kidney disease with stage 1 through stage 4 chronic kidney disease, or unspecified chronic kidney disease: Secondary | ICD-10-CM | POA: Diagnosis present

## 2016-10-22 DIAGNOSIS — R9431 Abnormal electrocardiogram [ECG] [EKG]: Secondary | ICD-10-CM | POA: Diagnosis present

## 2016-10-22 DIAGNOSIS — E785 Hyperlipidemia, unspecified: Secondary | ICD-10-CM | POA: Diagnosis present

## 2016-10-22 DIAGNOSIS — N179 Acute kidney failure, unspecified: Secondary | ICD-10-CM | POA: Diagnosis present

## 2016-10-22 DIAGNOSIS — N182 Chronic kidney disease, stage 2 (mild): Secondary | ICD-10-CM

## 2016-10-22 DIAGNOSIS — R404 Transient alteration of awareness: Secondary | ICD-10-CM

## 2016-10-22 DIAGNOSIS — R002 Palpitations: Secondary | ICD-10-CM | POA: Diagnosis present

## 2016-10-22 LAB — HEMOGLOBIN A1C
HEMOGLOBIN A1C: 12.5 % — AB (ref 4.8–5.6)
Mean Plasma Glucose: 312.05 mg/dL

## 2016-10-22 LAB — TROPONIN I
Troponin I: 0.08 ng/mL (ref <0.03)
Troponin I: 0.09 ng/mL (ref <0.03)

## 2016-10-22 LAB — BLOOD GAS, ARTERIAL
ACID-BASE DEFICIT: 1.9 mmol/L (ref 0.0–2.0)
BICARBONATE: 22.1 mmol/L (ref 20.0–28.0)
DRAWN BY: 24487
FIO2: 0.21
O2 Saturation: 96 %
PATIENT TEMPERATURE: 98.6
PH ART: 7.402 (ref 7.350–7.450)
pCO2 arterial: 36.3 mmHg (ref 32.0–48.0)
pO2, Arterial: 78.9 mmHg — ABNORMAL LOW (ref 83.0–108.0)

## 2016-10-22 LAB — I-STAT VENOUS BLOOD GAS, ED
Acid-base deficit: 14 mmol/L — ABNORMAL HIGH (ref 0.0–2.0)
Bicarbonate: 11.5 mmol/L — ABNORMAL LOW (ref 20.0–28.0)
O2 Saturation: 78 %
PCO2 VEN: 26.8 mmHg — AB (ref 44.0–60.0)
PH VEN: 7.24 — AB (ref 7.250–7.430)
TCO2: 12 mmol/L — ABNORMAL LOW (ref 22–32)
pO2, Ven: 49 mmHg — ABNORMAL HIGH (ref 32.0–45.0)

## 2016-10-22 LAB — BASIC METABOLIC PANEL
Anion gap: 32 — ABNORMAL HIGH (ref 5–15)
Anion gap: 23 — ABNORMAL HIGH (ref 5–15)
Anion gap: 17 — ABNORMAL HIGH (ref 5–15)
Anion gap: 12 (ref 5–15)
BUN: 44 mg/dL — ABNORMAL HIGH (ref 6–20)
BUN: 41 mg/dL — ABNORMAL HIGH (ref 6–20)
BUN: 37 mg/dL — ABNORMAL HIGH (ref 6–20)
BUN: 34 mg/dL — AB (ref 6–20)
CALCIUM: 9.7 mg/dL (ref 8.9–10.3)
CALCIUM: 9.1 mg/dL (ref 8.9–10.3)
CALCIUM: 9.2 mg/dL (ref 8.9–10.3)
CALCIUM: 9.2 mg/dL (ref 8.9–10.3)
CHLORIDE: 106 mmol/L (ref 101–111)
CO2: 12 mmol/L — AB (ref 22–32)
CO2: 12 mmol/L — ABNORMAL LOW (ref 22–32)
CO2: 16 mmol/L — ABNORMAL LOW (ref 22–32)
CO2: 21 mmol/L — ABNORMAL LOW (ref 22–32)
CREATININE: 2.08 mg/dL — AB (ref 0.61–1.24)
CREATININE: 1.8 mg/dL — AB (ref 0.61–1.24)
CREATININE: 1.57 mg/dL — AB (ref 0.61–1.24)
CREATININE: 1.23 mg/dL (ref 0.61–1.24)
Chloride: 95 mmol/L — ABNORMAL LOW (ref 101–111)
Chloride: 108 mmol/L (ref 101–111)
Chloride: 110 mmol/L (ref 101–111)
GFR calc non Af Amer: >60 mL/min (ref >60)
GFR, EST AFRICAN AMERICAN: 40 mL/min — AB (ref >60)
GFR, EST AFRICAN AMERICAN: 48 mL/min — AB (ref >60)
GFR, EST AFRICAN AMERICAN: 56 mL/min — AB (ref >60)
GFR, EST NON AFRICAN AMERICAN: 34 mL/min — AB (ref >60)
GFR, EST NON AFRICAN AMERICAN: 41 mL/min — AB (ref >60)
GFR, EST NON AFRICAN AMERICAN: 48 mL/min — AB (ref >60)
Glucose, Bld: 568 mg/dL (ref 65–99)
Glucose, Bld: 406 mg/dL — ABNORMAL HIGH (ref 65–99)
Glucose, Bld: 303 mg/dL — ABNORMAL HIGH (ref 65–99)
Glucose, Bld: 145 mg/dL — ABNORMAL HIGH (ref 65–99)
Potassium: 4.7 mmol/L (ref 3.5–5.1)
Potassium: 4.4 mmol/L (ref 3.5–5.1)
Potassium: 4.3 mmol/L (ref 3.5–5.1)
Potassium: 4.1 mmol/L (ref 3.5–5.1)
SODIUM: 141 mmol/L (ref 135–145)
SODIUM: 141 mmol/L (ref 135–145)
SODIUM: 143 mmol/L (ref 135–145)
Sodium: 139 mmol/L (ref 135–145)

## 2016-10-22 LAB — CBG MONITORING, ED
GLUCOSE-CAPILLARY: 554 mg/dL — AB (ref 65–99)
GLUCOSE-CAPILLARY: 526 mg/dL — AB (ref 65–99)
GLUCOSE-CAPILLARY: 392 mg/dL — AB (ref 65–99)
GLUCOSE-CAPILLARY: 371 mg/dL — AB (ref 65–99)
GLUCOSE-CAPILLARY: 322 mg/dL — AB (ref 65–99)

## 2016-10-22 LAB — URINALYSIS, ROUTINE W REFLEX MICROSCOPIC
BACTERIA UA: NONE SEEN
Bilirubin Urine: NEGATIVE
Glucose, UA: >=500 mg/dL — AB
KETONES UR: 80 mg/dL — AB
Leukocytes, UA: NEGATIVE
Nitrite: NEGATIVE
PROTEIN: 100 mg/dL — AB
RBC / HPF: NONE SEEN RBC/hpf (ref 0–5)
SQUAMOUS EPITHELIAL / LPF: NONE SEEN
Specific Gravity, Urine: 1.021 (ref 1.005–1.030)
WBC UA: NONE SEEN WBC/hpf (ref 0–5)
pH: 5 (ref 5.0–8.0)

## 2016-10-22 LAB — RAPID URINE DRUG SCREEN, HOSP PERFORMED
Amphetamines: NOT DETECTED
BENZODIAZEPINES: NOT DETECTED
Barbiturates: NOT DETECTED
COCAINE: NOT DETECTED
Opiates: NOT DETECTED
Tetrahydrocannabinol: NOT DETECTED

## 2016-10-22 LAB — LACTIC ACID, PLASMA
LACTIC ACID, VENOUS: 0.9 mmol/L (ref 0.5–1.9)
Lactic Acid, Venous: 1.3 mmol/L (ref 0.5–1.9)

## 2016-10-22 LAB — GLUCOSE, CAPILLARY
GLUCOSE-CAPILLARY: 300 mg/dL — AB (ref 65–99)
GLUCOSE-CAPILLARY: 112 mg/dL — AB (ref 65–99)
Glucose-Capillary: 266 mg/dL — ABNORMAL HIGH (ref 65–99)
Glucose-Capillary: 130 mg/dL — ABNORMAL HIGH (ref 65–99)

## 2016-10-22 LAB — CBC
HCT: 41.2 % (ref 39.0–52.0)
Hemoglobin: 13.1 g/dL (ref 13.0–17.0)
MCH: 25.1 pg — AB (ref 26.0–34.0)
MCHC: 31.8 g/dL (ref 30.0–36.0)
MCV: 79.1 fL (ref 78.0–100.0)
PLATELETS: 366 10*3/uL (ref 150–400)
RBC: 5.21 MIL/uL (ref 4.22–5.81)
RDW: 12.9 % (ref 11.5–15.5)
WBC: 8.6 10*3/uL (ref 4.0–10.5)

## 2016-10-22 LAB — CK: CK TOTAL: 68 U/L (ref 49–397)

## 2016-10-22 LAB — ETHANOL

## 2016-10-22 MED ORDER — METOPROLOL TARTRATE 25 MG PO TABS
25.0000 mg | ORAL_TABLET | Freq: Two times a day (BID) | ORAL | Status: DC
  Administered 2016-10-22 – 2016-10-27 (×11): 25 mg via ORAL
  Filled 2016-10-22 (×11): qty 1

## 2016-10-22 MED ORDER — SODIUM CHLORIDE 0.9 % IV BOLUS (SEPSIS)
1000.0000 mL | Freq: Once | INTRAVENOUS | Status: AC
  Administered 2016-10-22: 1000 mL via INTRAVENOUS

## 2016-10-22 MED ORDER — HEPARIN SODIUM (PORCINE) 5000 UNIT/ML IJ SOLN
5000.0000 [IU] | Freq: Three times a day (TID) | INTRAMUSCULAR | Status: DC
  Administered 2016-10-22 – 2016-10-27 (×15): 5000 [IU] via SUBCUTANEOUS
  Filled 2016-10-22 (×15): qty 1

## 2016-10-22 MED ORDER — INSULIN ASPART 100 UNIT/ML ~~LOC~~ SOLN
10.0000 [IU] | Freq: Once | SUBCUTANEOUS | Status: AC
  Administered 2016-10-22: 10 [IU] via INTRAVENOUS
  Filled 2016-10-22: qty 1

## 2016-10-22 MED ORDER — DEXTROSE-NACL 5-0.45 % IV SOLN
INTRAVENOUS | Status: DC
  Administered 2016-10-22: 14:00:00 via INTRAVENOUS

## 2016-10-22 MED ORDER — INSULIN REGULAR HUMAN 100 UNIT/ML IJ SOLN
INTRAMUSCULAR | Status: DC
  Administered 2016-10-23: 3.6 [IU]/h via INTRAVENOUS
  Administered 2016-10-23: 7.6 [IU]/h via INTRAVENOUS
  Filled 2016-10-22 (×2): qty 1

## 2016-10-22 MED ORDER — NICOTINE POLACRILEX 2 MG MT GUM
2.0000 mg | CHEWING_GUM | OROMUCOSAL | Status: DC | PRN
  Filled 2016-10-22: qty 1

## 2016-10-22 MED ORDER — HYDRALAZINE HCL 20 MG/ML IJ SOLN
5.0000 mg | Freq: Three times a day (TID) | INTRAMUSCULAR | Status: DC | PRN
  Administered 2016-10-22: 10 mg via INTRAVENOUS
  Filled 2016-10-22: qty 1

## 2016-10-22 MED ORDER — HYDRALAZINE HCL 20 MG/ML IJ SOLN
25.0000 mg | Freq: Once | INTRAMUSCULAR | Status: AC
  Administered 2016-10-22: 25 mg via INTRAVENOUS
  Filled 2016-10-22: qty 2

## 2016-10-22 MED ORDER — POTASSIUM CHLORIDE 10 MEQ/100ML IV SOLN
10.0000 meq | INTRAVENOUS | Status: AC
  Filled 2016-10-22: qty 100

## 2016-10-22 MED ORDER — DEXTROSE-NACL 5-0.45 % IV SOLN
INTRAVENOUS | Status: DC
  Administered 2016-10-22 – 2016-10-23 (×2): via INTRAVENOUS

## 2016-10-22 MED ORDER — HYDRALAZINE HCL 25 MG PO TABS: 50.0000 mg | ORAL_TABLET | Freq: Two times a day (BID) | ORAL | Status: DC | PRN

## 2016-10-22 MED ORDER — SODIUM CHLORIDE 0.9 % IV SOLN
INTRAVENOUS | Status: DC
  Administered 2016-10-22 – 2016-10-24 (×2): via INTRAVENOUS

## 2016-10-22 MED ORDER — POTASSIUM CHLORIDE 10 MEQ/100ML IV SOLN
10.0000 meq | INTRAVENOUS | Status: AC
  Administered 2016-10-22 (×2): 10 meq via INTRAVENOUS

## 2016-10-22 MED ORDER — ATORVASTATIN CALCIUM 40 MG PO TABS
40.0000 mg | ORAL_TABLET | Freq: Every day | ORAL | Status: DC
  Administered 2016-10-22 – 2016-10-26 (×5): 40 mg via ORAL
  Filled 2016-10-22 (×7): qty 1

## 2016-10-22 MED ORDER — SODIUM CHLORIDE 0.9 % IV SOLN
INTRAVENOUS | Status: DC
  Administered 2016-10-22: 4.7 [IU]/h via INTRAVENOUS
  Filled 2016-10-22: qty 1

## 2016-10-22 NOTE — Plan of Care (Signed)
Problem: Health Behavior: Goal: Ability to identify and alter actions that are detrimental to health will improve Outcome: Not Progressing Need reinforcement.

## 2016-10-22 NOTE — H&P (Signed)
History and Physical    Mike [name] ZOX:[id number] DOB: [birth year] DOA: 10/22/2016   PCP: [name], NP   Patient coming from:  Home    Chief Complaint: decreased level of responsiveness  HPI: Mike [name] is a 56 y.o. homeless  male with medical history significant for HTN, HLD, CKD, history of seizures, History of remote stroke, migraines, and history of diabetes, insulin-dependent, not on meds over the last 2 weeks in the setting of medicine noncompliance, with a history of DKA, recurrent, last to be and in September 2018, found by a bystander on the floor, minimally responsive, very lethargic, for which he called EMS. Upon arrival, his blood sugar was 569. The patient received 10 units of insulin, and then was placed on a glucose stabilizer, and also received 2.5 L of fluid. At this time, the patient is slightly more awake, but unable to engage in conversation. He is Level5 caveat that due to these confusion, and further history cannot be obtained.   ED Course:  BP (!) 171/75   Pulse (!) 112   Temp 98.7 F (37.1 C)   Resp (!) 21   Ht  (1.93 m)   Wt 63.5 kg (140 lb)   SpO2 100%   BMI 17.04 kg/m   Glucose was 526, after10 units of insulin, and then was placed on a glucose stabilizer with glucose now at 392, and also received 2.5 L of fluid PH 7.24, PCO2 26.8, P0 249, bicarb 12, and annual gap of 32 Sodium 139, potassium 4.7, Creatinine 2.08, GFR 40 White count 8.6 hemoglobin 13.1 platelets 366 Urine with elevated glucose, 80 ketones, negative for leukocytes and nitrites CT of the head and chest x-ray pending Last echo in July 2018 was EF 65-70%, vigorous  systolic function  Review of Systems:  As per HPI otherwise all other systems reviewed and are negative  Past Medical History:  Diagnosis Date  . Anxiety   . Anxiety   . Chronic lower back pain   . Depression   . DKA (diabetic ketoacidoses) (HCC) 07/30/2016  . Hyperlipemia   . Hypertension   .  Migraine    "last one was ~ 4 yr ago" (12/23/2014)  . Seizures (HCC)    "related to pills for anxiety; if I don't take the pills I'm suppose to take I'll have them" (12/23/2014)  . Type II diabetes mellitus (HCC)     Past Surgical History:  Procedure Laterality Date  . NO PAST SURGERIES      Social History Social History   Social History  . Marital status: Divorced    Spouse name: N/A  . Number of children: N/A  . Years of education: N/A   Occupational History  . Not on file.   Social History Main Topics  . Smoking status: Current Every Day Smoker    Packs/day: 0.10    Years: 40.00    Types: Cigarettes  . Smokeless tobacco: Never Used  . Alcohol use 1.2 oz/week    2 Cans of beer per week  . Drug use: No     Comment: 12/23/2014 "stopped ~ 10 yrs ago"  . Sexual activity: Yes    Partners: Female   Other Topics Concern  . Not on file   Social History Narrative  . No narrative on file     Allergies  Allergen Reactions  . Ibuprofen Nausea And Vomiting and Other (See Comments)    Reaction:  Bloating   . Tylenol [Acetaminophen] Nausea And  Vomiting and Other (See Comments)    Reaction:  Bloating     Family History  Problem Relation Age of Onset  . Diabetes Mellitus II Mother   . Diabetes Mellitus II Father       Prior to Admission medications   Medication Sig Start Date End Date Taking? Authorizing Provider  aspirin 325 MG tablet Take 1 tablet (325 mg total) by mouth daily. 08/06/16   Maxie Barb, MD  atorvastatin (LIPITOR) 40 MG tablet Take 1 tablet (40 mg total) by mouth daily at 6 PM. Patient taking differently: Take 40 mg by mouth daily.  08/06/16   Maxie Barb, MD  hydrALAZINE (APRESOLINE) 50 MG tablet Take 1 tablet (50 mg total) by mouth 3 (three) times daily. Patient taking differently: Take 50 mg by mouth 2 (two) times daily as needed (high blood pressure).  07/28/16 07/28/17  Linwood Dibbles, MD  insulin aspart (NOVOLOG) 100 UNIT/ML injection  Inject 3 Units into the skin 3 (three) times daily with meals. Patient not taking: Reported on 07/31/2016 07/28/16   Linwood Dibbles, MD  insulin aspart protamine- aspart (NOVOLOG MIX 70/30) (70-30) 100 UNIT/ML injection Inject 0.25 mLs (25 Units total) into the skin 2 (two) times daily with a meal. Patient not taking: Reported on 10/09/2016 08/06/16   Maxie Barb, MD  insulin NPH-regular Human (NOVOLIN 70/30) (70-30) 100 UNIT/ML injection Inject 30 Units into the skin daily with breakfast.    [provider]  isosorbide dinitrate (ISORDIL) 20 MG tablet Take 1.5 tablets (30 mg total) by mouth 3 (three) times daily. Patient not taking: Reported on 07/28/2016 06/28/16   Lonia Blood, MD  metFORMIN (GLUCOPHAGE) 500 MG tablet Take 1 tablet (500 mg total) by mouth 2 (two) times daily with a meal. 07/28/16   Linwood Dibbles, MD  metoprolol tartrate (LOPRESSOR) 25 MG tablet Take 25 mg by mouth 2 (two) times daily. 07/28/16   [provider]  metoprolol tartrate (LOPRESSOR) 50 MG tablet Take 1 tablet (50 mg total) by mouth 2 (two) times daily. 08/06/16   Maxie Barb, MD    Physical Exam:  Vitals:   10/22/16 1130 10/22/16 1145 10/22/16 1215 10/22/16 1253  BP: (!) 147/65 137/68 (!) 173/73 (!) 171/75  Pulse: (!) 111 (!) 121 (!) 109 (!) 112  Resp: (!) (!) 21  Temp:      SpO2: 100% 99% 100% 100%  Weight:      Height:       Constitutional: NAD, arrousable but unable to follow simple commands, repeats "Cone, Cone" . Discheveled  Eyes: PERRL, lids and conjunctivae normal, Sclera opaque  ENMT: Mucous membranes are moist, without exudate or lesions  Neck: normal, supple, no masses, no thyromegaly Respiratory: clear to auscultation bilaterally, no wheezing, no crackles. Normal respiratory effort  Cardiovascular: Regular rate and rhythm, 2/6 murmur, rubs or gallops. No extremity edema. 2+ pedal pulses. No carotid bruits.  Abdomen: Soft, non tender, No hepatosplenomegaly.  Bowel sounds positive.  Musculoskeletal: no clubbing / cyanosis. Moves all extremities Skin: no jaundice, No lesions.  Neurologic: Sensation intact  Strength unable to test, patient unable to follow commands at this time. Arousable, lethargic.      Labs on Admission: I have personally reviewed following labs and imaging studies  CBC:  Recent Labs Lab 10/22/16 1057  WBC 8.6  HGB 13.1  HCT 41.2  MCV 79.1  PLT 366    Basic Metabolic Panel:  Recent Labs Lab 10/22/16 1057  NA 139  K 4.7  CL 95*  CO2 12*  GLUCOSE 568*  BUN 44*  CREATININE 2.08*  CALCIUM 9.7    GFR: Estimated Creatinine Clearance: 36.5 mL/min (A) (by C-G formula based on SCr of 2.08 mg/dL (H)).  Liver Function Tests: No results for input(s): AST, ALT, ALKPHOS, BILITOT, PROT, ALBUMIN in the last 168 hours. No results for input(s): LIPASE, AMYLASE in the last 168 hours. No results for input(s): AMMONIA in the last 168 hours.  Coagulation Profile: No results for input(s): INR, PROTIME in the last 168 hours.  Cardiac Enzymes: No results for input(s): CKTOTAL, CKMB, CKMBINDEX, TROPONINI in the last 168 hours.  BNP (last 3 results) No results for input(s): PROBNP in the last 8760 hours.  HbA1C: No results for input(s): HGBA1C in the last 72 hours.  CBG:  Recent Labs Lab 10/22/16 1053 10/22/16 1221 10/22/16 1416  GLUCAP 554* 526* 392*    Lipid Profile: No results for input(s): CHOL, HDL, LDLCALC, TRIG, CHOLHDL, LDLDIRECT in the last 72 hours.  Thyroid Function Tests: No results for input(s): TSH, T4TOTAL, FREET4, T3FREE, THYROIDAB in the last 72 hours.  Anemia Panel: No results for input(s): VITAMINB12, FOLATE, FERRITIN, TIBC, IRON, RETICCTPCT in the last 72 hours.  Urine analysis:    Component Value Date/Time   COLORURINE YELLOW 10/22/2016 1222   APPEARANCEUR CLEAR 10/22/2016 1222   LABSPEC 1.021 10/22/2016 1222   PHURINE 5.0 10/22/2016 1222   GLUCOSEU >=500 (A) 10/22/2016 1222    HGBUR SMALL (A) 10/22/2016 1222   BILIRUBINUR NEGATIVE 10/22/2016 1222   KETONESUR 80 (A) 10/22/2016 1222   PROTEINUR 100 (A) 10/22/2016 1222   UROBILINOGEN 1.0 02/07/2011 1605   NITRITE NEGATIVE 10/22/2016 1222   LEUKOCYTESUR NEGATIVE 10/22/2016 1222    Sepsis Labs: (procalcitonin:4,lacticidven:4) )No results found for this or any previous visit (from the past 240 hour(s)).   Radiological Exams on Admission: Ct Head Wo Contrast  Result Date: 10/22/2016 CLINICAL DATA:  Patient's presentation today consistent with hyperglycemia and DKA EXAM: CT HEAD WITHOUT CONTRAST TECHNIQUE: Contiguous axial images were obtained from the base of the skull through the vertex without intravenous contrast. COMPARISON:  None. FINDINGS: Brain: No evidence of acute infarction, hemorrhage, hydrocephalus, extra-axial collection or mass lesion/mass effect. Generalized cerebral atrophy. Periventricular white matter low attenuation likely secondary to microangiopathy. Vascular: No hyperdense vessel or unexpected calcification. Skull: No osseous abnormality. Sinuses/Orbits: Visualized paranasal sinuses are clear. Visualized mastoid sinuses are clear. Visualized orbits demonstrate no focal abnormality. Other: None IMPRESSION: 1. No acute intracranial pathology. 2. Chronic microvascular disease and cerebral atrophy. Electronically Signed   By: Elige Ko   On: 10/22/2016 14:50    EKG: Independently reviewed.  Assessment/Plan Active Problems:   DKA (diabetic ketoacidoses) (HCC)   Homelessness   Hypertensive urgency   Intermittent palpitations   Tobacco abuse   Abnormal EKG   Type II diabetes mellitus with renal manifestations (HCC)   Protein-calorie malnutrition, severe   Essential hypertension   CKD (chronic kidney disease)      Decreased level of consciousness, likely due to Diabetic Ketoacidosis. Patient found in street, minimally responsive. Has not been taking his oral insulin for 2 weeks . Also  r/o infection, stroke.  Afebrile Admission Glucose was 569 , Anion Gap 32   Bicarb 12. TN 0.07   Received 2.5 bolus NS,  Insulin drip initiated at the ED . WBC normal  Admit for SDU DKA order set  Hold oral hypoglycemics ABG repeat in 6 hrs  lactic acid  NPO for now, may advance when more alert  CT head r/o stroke UDS CXR r/o infiltrate  CK, unknown how long he has been on the ground  Serial Tn, EKG   Hyperlipidemia Resume statins   Hypertension BP171/75   Pulse112   Resume anti-hypertensive medications  Add Hydralazine Q8 hours as needed for BP 160/90    Tobacco abuse with nicotine withdrawal Nicotine  Gum prn  Acute on Chronic kidney disease stage 3 , Cr 2.08, bl 0.7-1  Lab Results  Component Value Date   CREATININE 2.08 (H) 10/22/2016   CREATININE 1.11 10/09/2016   CREATININE 0.76 08/25/2016  IVF Repeat CMET in am    DVT prophylaxis: Heparin   Code Status:    Fulll  Family Communication:  None Disposition Plan:Care manager and SW consult for possible placement  Consults called:    None Admission status: SDU    Mike Driskill E, PA-C [facility]   10/22/2016, 2:54 PM

## 2016-10-22 NOTE — ED Triage Notes (Signed)
Per ems, pt homeless, bystander called ems due to pt being lethargic. Pt is slow to respond but alert and orietned, hx of HTN and diabetes. Been out of his meds x2 weeks. CBG 559 by ems, BP 196/100. Pt denies pain.

## 2016-10-22 NOTE — ED Provider Notes (Addendum)
Medical screening examination/treatment/procedure(s) were conducted as a shared visit with non-physician practitioner(s) and myself.  I personally evaluated the patient during the encounter.   EKG Interpretation  Date/Time:  Sunday October 22 2016 10:54:00 EDT Ventricular Rate:  96 PR Interval:    QRS Duration: 93 QT Interval:  381 QTC Calculation: 482 R Axis:   19 Text Interpretation:  Sinus rhythm Biatrial enlargement Left ventricular hypertrophy ST depr, consider ischemia, inferior leads ST elevation, consider anterior injury Tall T, consider metabolic/ischemic abnrm Borderline prolonged QT interval No significant change since last tracing Confirmed by Vanetta Mulders [phone]) on 10/22/2016 11:04:19 AM      Results for orders placed or performed during the hospital encounter of 10/22/16  Basic metabolic panel  Result Value Ref Range   Sodium 139 135 - 145 mmol/L   Potassium 4.7 3.5 - 5.1 mmol/L   Chloride 95 (L) 101 - 111 mmol/L   CO2 12 (L) 22 - 32 mmol/L   Glucose, Bld 568 (HH) 65 - 99 mg/dL   BUN 44 (H) 6 - 20 mg/dL   Creatinine, Ser 6.04 (H) 0.61 - 1.24 mg/dL   Calcium 9.7 8.9 - 54.0 mg/dL   GFR calc non Af Amer 34 (L) >60 mL/min   GFR calc Af Amer 40 (L) >60 mL/min   Anion gap 32 (H) 5 - 15  CBC  Result Value Ref Range   WBC 8.6 4.0 - 10.5 K/uL   RBC 5.21 4.22 - 5.81 MIL/uL   Hemoglobin 13.1 13.0 - 17.0 g/dL   HCT 98.1 19.1 - 47.8 %   MCV 79.1 78.0 - 100.0 fL   MCH 25.1 (L) 26.0 - 34.0 pg   MCHC 31.8 30.0 - 36.0 g/dL   RDW 29.5 62.1 - 30.8 %   Platelets 366 150 - 400 K/uL  Urinalysis, Routine w reflex microscopic  Result Value Ref Range   Color, Urine YELLOW YELLOW   APPearance CLEAR CLEAR   Specific Gravity, Urine 1.021 1.005 - 1.030   pH 5.0 5.0 - 8.0   Glucose, UA >=500 (A) NEGATIVE mg/dL   Hgb urine dipstick SMALL (A) NEGATIVE   Bilirubin Urine NEGATIVE NEGATIVE   Ketones, ur 80 (A) NEGATIVE mg/dL   Protein, ur 657 (A) NEGATIVE mg/dL   Nitrite NEGATIVE  NEGATIVE   Leukocytes, UA NEGATIVE NEGATIVE   RBC / HPF NONE SEEN 0 - 5 RBC/hpf   WBC, UA NONE SEEN 0 - 5 WBC/hpf   Bacteria, UA NONE SEEN NONE SEEN   Squamous Epithelial / LPF NONE SEEN NONE SEEN   Hyaline Casts, UA PRESENT   CBG monitoring, ED  Result Value Ref Range   Glucose-Capillary 554 (HH) 65 - 99 mg/dL  I-Stat venous blood gas, ED  Result Value Ref Range   pH, Ven 7.240 (L) 7.250 - 7.430   pCO2, Ven 26.8 (L) 44.0 - 60.0 mmHg   pO2, Ven 49.0 (H) 32.0 - 45.0 mmHg   Bicarbonate 11.5 (L) 20.0 - 28.0 mmol/L   TCO2 12 (L) 22 - 32 mmol/L   O2 Saturation 78.0 %   Acid-base deficit 14.0 (H) 0.0 - 2.0 mmol/L   Patient temperature HIDE    Sample type VENOUS   CBG monitoring, ED  Result Value Ref Range   Glucose-Capillary 526 (HH) 65 - 99 mg/dL   No results found.   Patient seen by me along with physician assistant. Patient is homeless. Patient has diabetes but is noncompliant with insulin. Appears to be a type II  diabetic but frequently goes into DKA. Patient's presentation today consistent with hyperglycemia and DKA. In severe dehydration. Patient given a trial of 10 units of regular insulin without any significant change in blood sugar still in the 500s. Patient showing evidence of acidosis. Patient showing evidence of prerenal failure most likely consistent with dehydration.  Patient started on glucose stabilizer, given IV fluids. Patient will require admission. Patient's unassigned. Last admission was in August.   Patient is alert. Patient appears very dehydrated. Patient heart rate up in the upper 90s. Lungs clear. Abdomen flat.  Total critical care time of 30 minutes will be documented by physician assistant.   Vanetta Mulders, MD 10/22/16 1308    Vanetta Mulders, MD 10/22/16 1308

## 2016-10-22 NOTE — ED Notes (Signed)
CBG 526 

## 2016-10-22 NOTE — ED Triage Notes (Signed)
Pt given 500 cc bolus by ems.

## 2016-10-22 NOTE — ED Provider Notes (Signed)
[facility]-EMERG[facility]Y DEPT Provider Note   CSN: [id number] Arrival date & time: 10/22/16  1044     History   Chief Complaint Chief Complaint  Patient presents with  . Hypertension  . Hyperglycemia    HPI Mike [name] is a 56 y.o. male with past medical history of uncontrolled type 2 diabetes, hypertension, hyperlipidemia, homelessness, brought in by EMS for lethargy. Patient is homeless and a bystander called EMS for lethargy. On EMS arrival, CBG 559, BP 196/100. Patient with multiple recent visits for hyperglycemia. He is insulin-dependent, and does not check his sugar nor does he use insulin. He also does not treat his blood pressure, per chart review prescribed medications include hydralazine and metoprolol. Patient denies shortness breath, cough, abdominal pain, wounds, recent illnesses, or any complaints today other than feeling "unfocused."  The history is provided by the patient.    Past Medical History:  Diagnosis Date  . Anxiety   . Anxiety   . Chronic lower back pain   . Depression   . DKA (diabetic ketoacidoses) (HCC) 07/30/2016  . Hyperlipemia   . Hypertension   . Migraine    "last one was ~ 4 yr ago" (12/23/2014)  . Seizures (HCC)    "related to pills for anxiety; if I don't take the pills I'm suppose to take I'll have them" (12/23/2014)  . Type II diabetes mellitus [facility])     Patient Active Problem List   Diagnosis Date Noted  . CKD (chronic kidney disease) 10/22/2016  . Uncontrolled type 2 diabetes mellitus with hyperosmolar nonketotic hyperglycemia (HCC) 08/24/2016  . Essential hypertension 08/24/2016  . Acute ischemic stroke (HCC)   . Acute renal failure superimposed on stage 2 chronic kidney disease (HCC) 07/31/2016  . Acute metabolic encephalopathy 07/31/2016  . DKA (diabetic ketoacidoses) (HCC) 06/24/2016  . Acute kidney injury (nontraumatic) (HCC) 06/24/2016  . Protein-calorie malnutrition, severe 12/24/2014  . Chest pain, pleuritic 12/23/2014  .  Homelessness 12/23/2014  . Hypertensive urgency 12/23/2014  . DM (diabetes mellitus) (HCC) 12/23/2014  . Intermittent palpitations 12/23/2014  . Tobacco abuse 12/23/2014  . Pleuritic chest pain 12/23/2014  . Abnormal EKG 12/23/2014  . Cough   . Type II diabetes mellitus with renal manifestations [facility])     Past Surgical History:  Procedure Laterality Date  . NO PAST SURGERIES         Home Medications    Prior to Admission medications   Medication Sig Start Date End Date Taking? Authorizing Provider  aspirin 325 MG tablet Take 1 tablet (325 mg total) by mouth daily. 08/06/16   Maxie Barb, MD  atorvastatin (LIPITOR) 40 MG tablet Take 1 tablet (40 mg total) by mouth daily at 6 PM. Patient taking differently: Take 40 mg by mouth daily.  08/06/16   Maxie Barb, MD  hydrALAZINE (APRESOLINE) 50 MG tablet Take 1 tablet (50 mg total) by mouth 3 (three) times daily. Patient taking differently: Take 50 mg by mouth 2 (two) times daily as needed (high blood pressure).  07/28/16 07/28/17  Linwood Dibbles, MD  insulin aspart (NOVOLOG) 100 UNIT/ML injection Inject 3 Units into the skin 3 (three) times daily with meals. Patient not taking: Reported on 07/31/2016 07/28/16   Linwood Dibbles, MD  insulin aspart protamine- aspart (NOVOLOG MIX 70/30) (70-30) 100 UNIT/ML injection Inject 0.25 mLs (25 Units total) into the skin 2 (two) times daily with a meal. Patient not taking: Reported on 10/09/2016 08/06/16   Maxie Barb, MD  insulin NPH-regular Human (NOVOLIN 70/30) (  70-30) 100 UNIT/ML injection Inject 30 Units into the skin daily with breakfast.    [provider]  isosorbide dinitrate (ISORDIL) 20 MG tablet Take 1.5 tablets (30 mg total) by mouth 3 (three) times daily. Patient not taking: Reported on 07/28/2016 06/28/16   Lonia Blood, MD  metFORMIN (GLUCOPHAGE) 500 MG tablet Take 1 tablet (500 mg total) by mouth 2 (two) times daily with a meal. 07/28/16   Linwood Dibbles, MD    metoprolol tartrate (LOPRESSOR) 25 MG tablet Take 25 mg by mouth 2 (two) times daily. 07/28/16   [provider]  metoprolol tartrate (LOPRESSOR) 50 MG tablet Take 1 tablet (50 mg total) by mouth 2 (two) times daily. 08/06/16   Maxie Barb, MD    Family History Family History  Problem Relation Age of Onset  . Diabetes Mellitus II Mother   . Diabetes Mellitus II Father     Social History Social History  Substance Use Topics  . Smoking status: Current Every Day Smoker    Packs/day: 0.10    Years: 40.00    Types: Cigarettes  . Smokeless tobacco: Never Used  . Alcohol use 1.2 oz/week    2 Cans of beer per week     Allergies   Ibuprofen and Tylenol [acetaminophen]   Review of Systems Review of Systems  Constitutional: Positive for fatigue. Negative for fever.  HENT: Negative for congestion and sore throat.   Respiratory: Negative for cough and shortness of breath.   Cardiovascular: Negative for chest pain.  Gastrointestinal: Negative for abdominal pain, nausea and vomiting.  Genitourinary: Positive for frequency. Negative for dysuria.  Skin: Negative for wound.  Allergic/Immunologic: Positive for immunocompromised state (uncontrolled T2DM).  Neurological:       "unfocused"  All other systems reviewed and are negative.    Physical Exam Updated Vital Signs BP (!) 230/108   Pulse 89   Temp 98.7 F (37.1 C)   Resp (!) 30   Ht  (1.93 m)   Wt 63.5 kg (140 lb)   SpO2 100%   BMI 17.04 kg/m   Physical Exam  Constitutional: He appears well-developed and well-nourished.  Disheveled with poor hygiene.  HENT:  Head: Normocephalic and atraumatic.  Eyes: Pupils are equal, round, and reactive to light. Conjunctivae and EOM are normal.  Neck: Normal range of motion. Neck supple.  Cardiovascular: Normal rate, regular rhythm, normal heart sounds and intact distal pulses.  Exam reveals no friction rub.   No murmur heard. Pulmonary/Chest: Effort normal  and breath sounds normal. No respiratory distress. He has no wheezes. He has no rales.  Abdominal: Soft. Bowel sounds are normal. He exhibits no distension. There is no tenderness. There is no rebound and no guarding.  Lymphadenopathy:    He has no cervical adenopathy.  Neurological: He is alert.  Mental Status:  Alert. Speaking slowly. Oriented to person, place and year. Speech fluent without evidence of aphasia. Able to follow 2 step commands without difficulty.  Cranial Nerves:  II:  Peripheral visual fields grossly normal, pupils equal, round, reactive to light III,IV, VI: ptosis not present, extra-ocular motions intact bilaterally  V,VII: smile symmetric, facial light touch sensation equal VIII: hearing grossly normal to voice  X: uvula elevates symmetrically  XI: bilateral shoulder shrug symmetric and strong XII: midline tongue extension without fassiculations Motor:  Normal tone. 5/5 in upper and lower extremities bilaterally including strong and equal grip strength and dorsiflexion/plantar flexion Sensory: Pinprick and light touch normal in all  extremities.  Deep Tendon Reflexes: 2+ and symmetric in the biceps and patella Cerebellar: normal finger-to-nose with bilateral upper extremities CV: distal pulses palpable throughout    Skin: Skin is warm.  No wounds  Psychiatric: He has a normal mood and affect. His behavior is normal.  Nursing note and vitals reviewed.    ED Treatments / Results  Labs (all labs ordered are listed, but only abnormal results are displayed) Labs Reviewed  BASIC METABOLIC PANEL - Abnormal; Notable for the following:       Result Value   Chloride 95 (*)    CO2 12 (*)    Glucose, Bld 568 (*)    BUN 44 (*)    Creatinine, Ser 2.08 (*)    GFR calc non Af Amer 34 (*)    GFR calc Af Amer 40 (*)    Anion gap 32 (*)    All other components within normal limits  CBC - Abnormal; Notable for the following:    MCH 25.1 (*)    All other components within  normal limits  URINALYSIS, ROUTINE W REFLEX MICROSCOPIC - Abnormal; Notable for the following:    Glucose, UA >=500 (*)    Hgb urine dipstick SMALL (*)    Ketones, ur 80 (*)    Protein, ur 100 (*)    All other components within normal limits  CBG MONITORING, ED - Abnormal; Notable for the following:    Glucose-Capillary 554 (*)    All other components within normal limits  I-STAT VENOUS BLOOD GAS, ED - Abnormal; Notable for the following:    pH, Ven 7.240 (*)    pCO2, Ven 26.8 (*)    pO2, Ven 49.0 (*)    Bicarbonate 11.5 (*)    TCO2 12 (*)    Acid-base deficit 14.0 (*)    All other components within normal limits  CBG MONITORING, ED - Abnormal; Notable for the following:    Glucose-Capillary 526 (*)    All other components within normal limits  CBG MONITORING, ED - Abnormal; Notable for the following:    Glucose-Capillary 392 (*)    All other components within normal limits  BASIC METABOLIC PANEL  BASIC METABOLIC PANEL  BASIC METABOLIC PANEL  BASIC METABOLIC PANEL  HEMOGLOBIN A1C  RAPID URINE DRUG SCREEN, HOSP PERFORMED  ETHANOL  LACTIC ACID, PLASMA  LACTIC ACID, PLASMA  BLOOD GAS, ARTERIAL  CK  TROPONIN I  TROPONIN I    EKG  EKG Interpretation  Date/Time:  Sunday October 22 2016 10:54:00 EDT Ventricular Rate:  96 PR Interval:    QRS Duration: 93 QT Interval:  381 QTC Calculation: 482 R Axis:   19 Text Interpretation:  Sinus rhythm Biatrial enlargement Left ventricular hypertrophy ST depr, consider ischemia, inferior leads ST elevation, consider anterior injury Tall T, consider metabolic/ischemic abnrm Borderline prolonged QT interval No significant change since last tracing Confirmed by Vanetta Mulders [phone]) on 10/22/2016 11:04:19 AM       Radiology Dg Chest 2 View  Result Date: 10/22/2016 CLINICAL DATA:  56 year old male with decreased level consciousness. EXAM: CHEST  2 VIEW COMPARISON:  Chest x-ray 07/28/2016. FINDINGS: Lung volumes are normal. No  consolidative airspace disease. No pleural effusions. No pneumothorax. No pulmonary nodule or mass noted. Pulmonary vasculature and the cardiomediastinal silhouette are within normal limits. IMPRESSION: No radiographic evidence of acute cardiopulmonary disease. Electronically Signed   By: Trudie Reed M.D.   On: 10/22/2016 15:14   Ct Head Wo Contrast  Result Date: 10/22/2016  CLINICAL DATA:  Patient's presentation today consistent with hyperglycemia and DKA EXAM: CT HEAD WITHOUT CONTRAST TECHNIQUE: Contiguous axial images were obtained from the base of the skull through the vertex without intravenous contrast. COMPARISON:  None. FINDINGS: Brain: No evidence of acute infarction, hemorrhage, hydrocephalus, extra-axial collection or mass lesion/mass effect. Generalized cerebral atrophy. Periventricular white matter low attenuation likely secondary to microangiopathy. Vascular: No hyperdense vessel or unexpected calcification. Skull: No osseous abnormality. Sinuses/Orbits: Visualized paranasal sinuses are clear. Visualized mastoid sinuses are clear. Visualized orbits demonstrate no focal abnormality. Other: None IMPRESSION: 1. No acute intracranial pathology. 2. Chronic microvascular disease and cerebral atrophy. Electronically Signed   By: Elige Ko   On: 10/22/2016 14:50    Procedures Procedures (including critical care time) CRITICAL CARE Performed by: [place] N Russo   Total critical care time: 30 minutes  Critical care time was exclusive of separately billable procedures and treating other patients.  Critical care was necessary to treat or prevent imminent or life-threatening deterioration.  Critical care was time spent personally by me on the following activities: development of treatment plan with patient and/or surrogate as well as nursing, discussions with consultants, evaluation of patient's response to treatment, examination of patient, obtaining history from patient or surrogate,  ordering and performing treatments and interventions, ordering and review of laboratory studies, ordering and review of radiographic studies, pulse oximetry and re-evaluation of patient's condition.  Medications Ordered in ED Medications  atorvastatin (LIPITOR) tablet 40 mg (not administered)  metoprolol tartrate (LOPRESSOR) tablet 25 mg (not administered)  0.9 %  sodium chloride infusion (not administered)  insulin regular (NOVOLIN R,HUMULIN R) 100 Units in sodium chloride 0.9 % 100 mL (1 Units/mL) infusion (3.3 Units/hr Intravenous Rate/Dose Change 10/22/16 1436)  heparin injection 5,000 Units (not administered)  dextrose 5 %-0.45 % sodium chloride infusion (not administered)  potassium chloride 10 mEq in 100 mL IVPB (not administered)  hydrALAZINE (APRESOLINE) injection 5-10 mg (not administered)  nicotine polacrilex (NICORETTE) gum 2 mg (not administered)  insulin aspart (novoLOG) injection 10 Units (10 Units Intravenous Given 10/22/16 1120)  sodium chloride 0.9 % bolus 1,000 mL (0 mLs Intravenous Stopped 10/22/16 1224)  hydrALAZINE (APRESOLINE) injection 25 mg (25 mg Intravenous Given 10/22/16 1119)  sodium chloride 0.9 % bolus 1,000 mL (0 mLs Intravenous Stopped 10/22/16 1509)     Initial Impression / Assessment and Plan / ED Course  I have reviewed the triage vital signs and the nursing notes.  Pertinent labs & imaging results that were available during my care of the patient were reviewed by me and considered in my medical decision making (see chart for details).  Clinical Course as of Oct 23 1519  Wynelle Link Oct 22, 2016  1351 Spoke with mid-level w [facility], Ms. Wertman, who is accepting pt for admission.  [JR]    Clinical Course User Index [JR] Russo, [place] N, PA-C    Patient  presented via EMS with CBG at 554, and severe hypertension at 230/108, tachypneic at 30 respirations. Patient with insulin-dependent type 2 diabetes and noncompliant secondary to homelessness. Pt also  noncompliant with blood pressure medications. Multiple visits for similar complaints. Patient alert on exam, with no neurologic deficits. Remainder of physical exam without abnormal findings. Hydralazine given with some improvement in blood pressure. Labs delayed from being processed in lab for unknown reason. 10 units of insulin given, without significant improvement and CBG from 554 to 526. Glucose stabilizer initiated for better blood glucose control. Pt was given multiple liters of IV  fluids. pH 7.24. anion gap 32.  Creatinine 2.08, likely prerenal secondary to dehydration. CBC unremarkable. [facility] consult[facility] admission, spoke with [facility], who is accepting patient for admission under Dr. Melynda Ripple.  Patient discussed with and seen by Dr. Deretha Emory  The patient appears reasonably stabilized for admission considering the current resources, flow, and capabilities available in the ED at this time, and I doubt any other [facility] requiring further screening and/or treatment in the ED prior to admission.  Final Clinical Impressions(s) / ED Diagnoses   Final diagnoses:  Diabetic ketoacidosis without coma associated with type 2 diabetes mellitus [facility])    New Prescriptions New Prescriptions   No medications on file     Russo, [place] N, PA-C 10/22/16 1521    Vanetta Mulders, MD 10/24/16 [phone]

## 2016-10-22 NOTE — ED Notes (Signed)
EKG given to MD and PA

## 2016-10-22 NOTE — ED Notes (Signed)
Attempted report 

## 2016-10-23 DIAGNOSIS — R404 Transient alteration of awareness: Secondary | ICD-10-CM

## 2016-10-23 DIAGNOSIS — Z590 Homelessness: Secondary | ICD-10-CM

## 2016-10-23 DIAGNOSIS — I10 Essential (primary) hypertension: Secondary | ICD-10-CM

## 2016-10-23 LAB — BASIC METABOLIC PANEL
ANION GAP: 14 (ref 5–15)
BUN: 26 mg/dL — ABNORMAL HIGH (ref 6–20)
CO2: 20 mmol/L — ABNORMAL LOW (ref 22–32)
Calcium: 9.1 mg/dL (ref 8.9–10.3)
Chloride: 108 mmol/L (ref 101–111)
Creatinine, Ser: 1.12 mg/dL (ref 0.61–1.24)
Glucose, Bld: 203 mg/dL — ABNORMAL HIGH (ref 65–99)
POTASSIUM: 4.6 mmol/L (ref 3.5–5.1)
SODIUM: 142 mmol/L (ref 135–145)

## 2016-10-23 LAB — BASIC METABOLIC PANEL WITH GFR
Anion gap: 7 (ref 5–15)
BUN: 26 mg/dL — ABNORMAL HIGH (ref 6–20)
CO2: 24 mmol/L (ref 22–32)
Calcium: 9.4 mg/dL (ref 8.9–10.3)
Chloride: 110 mmol/L (ref 101–111)
Creatinine, Ser: 1.02 mg/dL (ref 0.61–1.24)
GFR calc Af Amer: >60 mL/min
GFR calc non Af Amer: >60 mL/min
Glucose, Bld: 206 mg/dL — ABNORMAL HIGH (ref 65–99)
Potassium: 3.4 mmol/L — ABNORMAL LOW (ref 3.5–5.1)
Sodium: 141 mmol/L (ref 135–145)

## 2016-10-23 LAB — GLUCOSE, CAPILLARY
GLUCOSE-CAPILLARY: 123 mg/dL — AB (ref 65–99)
GLUCOSE-CAPILLARY: 171 mg/dL — AB (ref 65–99)
GLUCOSE-CAPILLARY: 209 mg/dL — AB (ref 65–99)
GLUCOSE-CAPILLARY: 237 mg/dL — AB (ref 65–99)
Glucose-Capillary: 246 mg/dL — ABNORMAL HIGH (ref 65–99)
Glucose-Capillary: 186 mg/dL — ABNORMAL HIGH (ref 65–99)
Glucose-Capillary: 97 mg/dL (ref 65–99)
Glucose-Capillary: 166 mg/dL — ABNORMAL HIGH (ref 65–99)

## 2016-10-23 MED ORDER — ASPIRIN 325 MG PO TABS
325.0000 mg | ORAL_TABLET | Freq: Every day | ORAL | Status: DC
  Administered 2016-10-23 – 2016-10-27 (×5): 325 mg via ORAL
  Filled 2016-10-23 (×5): qty 1

## 2016-10-23 MED ORDER — GLUCERNA SHAKE PO LIQD
237.0000 mL | Freq: Four times a day (QID) | ORAL | Status: DC
  Administered 2016-10-23 – 2016-10-27 (×11): 237 mL via ORAL
  Filled 2016-10-23 (×6): qty 237

## 2016-10-23 MED ORDER — INSULIN ASPART PROT & ASPART (70-30 MIX) 100 UNIT/ML ~~LOC~~ SUSP
30.0000 [IU] | Freq: Two times a day (BID) | SUBCUTANEOUS | Status: DC
  Administered 2016-10-23 – 2016-10-24 (×3): 30 [IU] via SUBCUTANEOUS
  Filled 2016-10-23: qty 10

## 2016-10-23 MED ORDER — INSULIN ASPART 100 UNIT/ML ~~LOC~~ SOLN
0.0000 [IU] | Freq: Three times a day (TID) | SUBCUTANEOUS | Status: DC
  Administered 2016-10-23: 2 [IU] via SUBCUTANEOUS
  Administered 2016-10-24: 7 [IU] via SUBCUTANEOUS
  Administered 2016-10-24: 3 [IU] via SUBCUTANEOUS
  Administered 2016-10-24: 9 [IU] via SUBCUTANEOUS
  Administered 2016-10-25: 6 [IU] via SUBCUTANEOUS
  Administered 2016-10-25: 3 [IU] via SUBCUTANEOUS
  Administered 2016-10-25: 5 [IU] via SUBCUTANEOUS
  Administered 2016-10-26: 2 [IU] via SUBCUTANEOUS

## 2016-10-23 NOTE — Care Management Note (Signed)
Case Management Note [name] RN, BSN Unit 4E-Case Manager [phone]   Patient Details  Name: Mike [name] MRN: [id number] Date of Birth: 1960-12-26  Subjective/Objective:   Pt admitted with  DKA                Action/Plan: Pt known to this CM- pt homeless- has [facility] in past- CSW to follow for homelessness issues- pt is also well known to [facility]- and has followed there in f/u with [name]- pt has been assisted with [facility] -July/18- and is not eligible at this time for [facility] assistance- pt gets his medications from [facility].  CM will follow for d/c needs.   Expected Discharge Date:                  Expected Discharge Plan:  Homeless Shelter  In-House Referral:  Clinical Social Work  Discharge planning Services  CM Consult  Post Acute Care Choice:    Choice offered to:     DME Arranged:    DME Agency:     HH Arranged:    HH Agency:     Status of Service:  In process, will continue to follow  If discussed at Long Length of Stay Meetings, dates discussed:    Discharge Disposition:   Additional Comments:  Darrold Span, RN 10/23/2016, 11:51 AM

## 2016-10-23 NOTE — Progress Notes (Signed)
PROGRESS NOTE    Mike [name]  ZOX:[id number] DOB: 1960/07/15 DOA: 10/22/2016 PCP: [name], NP  Brief Narrative: frail, chronically ill homeless diabetic male, brought to the emergency room after he was found in the floor minimally responsive by EMS, EMS called by a bystander. Patient has history of stroke, diabetes, chronic kidney disse, hypertension, remote history of seizures and long history of noncompliance. In the emergency room found to have severe metabolic acidosis and DKA. CT head notable for generalized atrophy, chronic ischemic changes no acute abnormalities He was started on insulin drip yesterday in the emergency room and fluid resuscitation   Assessment & Plan:   DKA -due to medication noncompliance and homelessness -DKA has corrected, will transition to long-acting insulin 70/30 per home regimen -IV Fluids -Social work and case management consult -numerous ED visits and admissions noted over the last 6 months more than likely due to his social issues and homelessness contributing to poor medical care  Diabetes mellitus type 2 -See above, check hemoglobin A1c  Metabolic encephalopathy -due to DKA improving -I also suspect some degree of cognitive issues, significant short term memory deficits noted -CT head notable for chronic microvascular disease and cerebral atrophy, hence suspicious for some degree of vascular dementia  Hyperlipidemia Resumed statins  Hypertension -stable, continue metoprolol, hydralazine on hold  Tobacco abuse with nicotine withdrawal Nicotine  Gum prn  Acute on Chronic kidney disease stage 3 ,  -Cr 2.08 on admission compared to baselineof 1 -resolved with hydration  History of CVA, - resume aspirin and Lipitor  DVT prophylaxis: Heparin   Code Status:    Fulll  Family Communication:  None Disposition Network engineer and SW consult , to evaluate for options    Procedures:   Antimicrobials:     Subjective: -feels better, unable to recall much about yesterday  Objective: Vitals:   10/22/16 2010 10/22/16 2300 10/23/16 0345 10/23/16 0400  BP: (!) 165/78  (!) 164/85 (!) 142/75  Pulse:      Resp:  14 20 (!) 21  Temp: 97.6 F (36.4 C)  97.6 F (36.4 C)   TempSrc: Oral  Oral   SpO2: 99% 98% 97% 97%  Weight:   64.5 kg (142 lb 4.8 oz)   Height:        Intake/Output Summary (Last 24 hours) at 10/23/16 1118 Last data filed at 10/23/16 8119  Gross per 24 hour  Intake             2500 ml  Output              370 ml  Net             2130 ml   Filed Weights   10/22/16 1046 10/22/16 1836 10/23/16 0345  Weight: 63.5 kg (140 lb) 63.5 kg (139 lb 15.9 oz) 64.5 kg (142 lb 4.8 oz)    Examination:  General exam: frail thinly built black American male, unkempt Respiratory system: Clear to auscultation. Respiratory effort normal. Cardiovascular system: S1 & S2 heard, RRR. No JVD, murmurs, rubs, gallops Gastrointestinal system: Abdomen is nondistended, soft and nontender.Normal bowel sounds heard. Central nervous system: Alert and oriented to self and place only. No focal neurological deficits. Extremities: Symmetric 5 x 5 power. Skin: No rashes, lesions or ulcers Psychiatry: significant cognitive and memory defoted    Data Reviewed:   CBC:  Recent Labs Lab 10/22/16 1057  WBC 8.6  HGB 13.1  HCT 41.2  MCV 79.1  PLT 366  Basic Metabolic Panel:  Recent Labs Lab 10/22/16 1601 10/22/16 1819 10/22/16 2228 10/23/16 0223 10/23/16 0536  NA 141 141 143 142 141  K 4.4 4.3 4.1 4.6 3.4*  CL 106 108 110 108 110  CO2 12* 16* 21* 20* 24  GLUCOSE 406* 303* 145* 203* 206*  BUN 41* 37* 34* 26* 26*  CREATININE 1.80* 1.57* 1.23 1.12 1.02  CALCIUM 9.1 9.2 9.2 9.1 9.4   GFR: Estimated Creatinine Clearance: 75.5 mL/min (by C-G formula based on SCr of 1.02 mg/dL). Liver Function Tests: No results for input(s): AST, ALT, ALKPHOS, BILITOT, PROT, ALBUMIN in the last 168  hours. No results for input(s): LIPASE, AMYLASE in the last 168 hours. No results for input(s): AMMONIA in the last 168 hours. Coagulation Profile: No results for input(s): INR, PROTIME in the last 168 hours. Cardiac Enzymes:  Recent Labs Lab 10/22/16 1601 10/22/16 1819 10/22/16 2228  CKTOTAL 68  --   --   TROPONINI  --  0.08* 0.09*   BNP (last 3 results) No results for input(s): PROBNP in the last 8760 hours. HbA1C:  Recent Labs  10/22/16 1601  HGBA1C 12.5*   CBG:  Recent Labs Lab 10/23/16 0210 10/23/16 0342 10/23/16 0505 10/23/16 0621 10/23/16 0902  GLUCAP 209* 246* 237* 186* 97   Lipid Profile: No results for input(s): CHOL, HDL, LDLCALC, TRIG, CHOLHDL, LDLDIRECT in the last 72 hours. Thyroid Function Tests: No results for input(s): TSH, T4TOTAL, FREET4, T3FREE, THYROIDAB in the last 72 hours. Anemia Panel: No results for input(s): VITAMINB12, FOLATE, FERRITIN, TIBC, IRON, RETICCTPCT in the last 72 hours. Urine analysis:    Component Value Date/Time   COLORURINE YELLOW 10/22/2016 1222   APPEARANCEUR CLEAR 10/22/2016 1222   LABSPEC 1.021 10/22/2016 1222   PHURINE 5.0 10/22/2016 1222   GLUCOSEU >=500 (A) 10/22/2016 1222   HGBUR SMALL (A) 10/22/2016 1222   BILIRUBINUR NEGATIVE 10/22/2016 1222   KETONESUR 80 (A) 10/22/2016 1222   PROTEINUR 100 (A) 10/22/2016 1222   UROBILINOGEN 1.0 02/07/2011 1605   NITRITE NEGATIVE 10/22/2016 1222   LEUKOCYTESUR NEGATIVE 10/22/2016 1222   Sepsis Labs: (procalcitonin:4,lacticidven:4)  )No results found for this or any previous visit (from the past 240 hour(s)).       Radiology Studies: Dg Chest 2 View  Result Date: 10/22/2016 CLINICAL DATA:  56 year old male with decreased level consciousness. EXAM: CHEST  2 VIEW COMPARISON:  Chest x-ray 07/28/2016. FINDINGS: Lung volumes are normal. No consolidative airspace disease. No pleural effusions. No pneumothorax. No pulmonary nodule or mass noted. Pulmonary  vasculature and the cardiomediastinal silhouette are within normal limits. IMPRESSION: No radiographic evidence of acute cardiopulmonary disease. Electronically Signed   By: Trudie Reed M.D.   On: 10/22/2016 15:14   Ct Head Wo Contrast  Result Date: 10/22/2016 CLINICAL DATA:  Patient's presentation today consistent with hyperglycemia and DKA EXAM: CT HEAD WITHOUT CONTRAST TECHNIQUE: Contiguous axial images were obtained from the base of the skull through the vertex without intravenous contrast. COMPARISON:  None. FINDINGS: Brain: No evidence of acute infarction, hemorrhage, hydrocephalus, extra-axial collection or mass lesion/mass effect. Generalized cerebral atrophy. Periventricular white matter low attenuation likely secondary to microangiopathy. Vascular: No hyperdense vessel or unexpected calcification. Skull: No osseous abnormality. Sinuses/Orbits: Visualized paranasal sinuses are clear. Visualized mastoid sinuses are clear. Visualized orbits demonstrate no focal abnormality. Other: None IMPRESSION: 1. No acute intracranial pathology. 2. Chronic microvascular disease and cerebral atrophy. Electronically Signed   By: Elige Ko   On: 10/22/2016 14:50  Scheduled Meds: . atorvastatin  40 mg Oral q1800  . heparin  5,000 Units Subcutaneous Q8H  . insulin aspart  0-9 Units Subcutaneous TID WC  . insulin aspart protamine- aspart  30 Units Subcutaneous BID WC  . metoprolol tartrate  25 mg Oral BID   Continuous Infusions: . sodium chloride 75 mL/hr at 10/23/16 0909     LOS: 1 day    Time spent:    Zannie Cove, MD [facility] Page via [email or web address], password [email or web address] After 7PM please contact night-coverage  10/23/2016, 11:18 AM

## 2016-10-23 NOTE — Progress Notes (Signed)
Patient's CBG at 10:45 was reported to be 48.  Patient was given a cup of orange juice and his late breakfast tray.  Will recheck CBG in 30 minutes.

## 2016-10-23 NOTE — Progress Notes (Signed)
Inpatient Diabetes Program Recommendations  AACE/ADA: New Consensus Statement on Inpatient Glycemic Control (2015)  Target Ranges:  Prepandial:   less than 140 mg/dL      Peak postprandial:   less than 180 mg/dL (1-2 hours)      Critically ill patients:  140 - 180 mg/dL   Lab Results  Component Value Date   GLUCAP 97 10/23/2016   HGBA1C 12.5 (H) 10/22/2016    Patient had very flat affect spoke minimally and did not answer my questions or really look at me directly. Patient reports being able to get medications but can'[t tell me where he stores insulin. If he is homeless, his insulin is not effective if not kept at room temp or in the refrigerator. Patient says he takes his insulin. Patient can't tell me where [facility] is or where his pharmacy is. Unsure if there is underlying Psych issues keeping patient from taking care of himself as well. Unable to help patient with minimal information. Several DM coordinator have tried to speak with patient about these same circumstances with minimal responses.  Thanks,  Christena Deem RN, MSN, [facility] Inpatient Diabetes Coordinator Team Pager [phone] (8a-5p)

## 2016-10-23 NOTE — Progress Notes (Signed)
Initial Nutrition Assessment  DOCUMENTATION CODES:   Severe malnutrition in context of social or environmental circumstances  INTERVENTION:  Glucerna Shake po QID, each supplement provides 220 kcal and 10 grams of protein  NUTRITION DIAGNOSIS:   Malnutrition (severe) related to social / environmental circumstances (homeless) as evidenced by severe depletion of body fat, severe depletion of muscle mass, percent weight loss (14% in 6 months).  GOAL:   Patient will meet greater than or equal to 90% of their needs  MONITOR:   PO intake, Supplement acceptance, Weight trends, I & O's  REASON FOR ASSESSMENT:   Malnutrition Screening Tool    ASSESSMENT:   Pt with PMH of HTN, HLD, type II DM, DKA, medicine noncompliance presents after being found by bystander, lethargic and minimally responsive.   Pt not talkative to RD at time of visit. Pt reports a "so-so" appetite PTA and that he is unsure of weight loss or status. Per chart review, pt has had 14% weight loss in 6 months, significant for time frame.  Pt reports enjoying the Glucerna supplement and is willing to continue while admitted.   Nutrition focused physical exam completed. Findings include mild/moderate to severe fat depletion, mild/moderate to severe muscle depletion and no edema.   Labs reviewed; CBG 97.266, K 3.4 Medications reviewed; Sliding scale insulin  Diet Order:  Diet Carb Modified Fluid consistency: Thin; Room service appropriate? Yes  Skin:  Reviewed, no issues  Last BM:  10/22/16  Height:   Ht Readings from Last 1 Encounters:  10/22/16  (1.93 m)    Weight:   Wt Readings from Last 1 Encounters:  10/23/16 142 lb 4.8 oz (64.5 kg)    Ideal Body Weight:  91.8 kg  BMI:  Body mass index is 17.32 kg/m.  Estimated Nutritional Needs:   Kcal:  2130-2255  Protein:  100-115 grams  Fluid:  >/= 2.1 L/d  EDUCATION NEEDS:   Education needs no appropriate at this time  Fransisca Kaufmann, MS,  RDN, LDN 10/23/2016 1:22 PM

## 2016-10-23 NOTE — Progress Notes (Signed)
Patient's CBG 89 after orange juice and breakfast tray

## 2016-10-24 ENCOUNTER — Ambulatory Visit: Payer: MEDICAID | Admitting: Neurology

## 2016-10-24 DIAGNOSIS — N183 Chronic kidney disease, stage 3 (moderate): Secondary | ICD-10-CM

## 2016-10-24 DIAGNOSIS — R9431 Abnormal electrocardiogram [ECG] [EKG]: Secondary | ICD-10-CM

## 2016-10-24 LAB — BASIC METABOLIC PANEL
ANION GAP: 8 (ref 5–15)
BUN: 22 mg/dL — ABNORMAL HIGH (ref 6–20)
CHLORIDE: 105 mmol/L (ref 101–111)
CO2: 25 mmol/L (ref 22–32)
Calcium: 9.2 mg/dL (ref 8.9–10.3)
Creatinine, Ser: 0.89 mg/dL (ref 0.61–1.24)
GFR calc non Af Amer: >60 mL/min (ref >60)
Glucose, Bld: 308 mg/dL — ABNORMAL HIGH (ref 65–99)
POTASSIUM: 4.1 mmol/L (ref 3.5–5.1)
SODIUM: 138 mmol/L (ref 135–145)

## 2016-10-24 LAB — CBC
HEMATOCRIT: 34.7 % — AB (ref 39.0–52.0)
HEMOGLOBIN: 10.8 g/dL — AB (ref 13.0–17.0)
MCH: 24 pg — ABNORMAL LOW (ref 26.0–34.0)
MCHC: 31.1 g/dL (ref 30.0–36.0)
MCV: 77.1 fL — ABNORMAL LOW (ref 78.0–100.0)
Platelets: 298 10*3/uL (ref 150–400)
RBC: 4.5 MIL/uL (ref 4.22–5.81)
RDW: 12.9 % (ref 11.5–15.5)
WBC: 7.6 10*3/uL (ref 4.0–10.5)

## 2016-10-24 LAB — GLUCOSE, CAPILLARY: GLUCOSE-CAPILLARY: 410 mg/dL — AB (ref 65–99)

## 2016-10-24 MED ORDER — INSULIN ASPART PROT & ASPART (70-30 MIX) 100 UNIT/ML ~~LOC~~ SUSP
40.0000 [IU] | Freq: Two times a day (BID) | SUBCUTANEOUS | Status: DC
  Administered 2016-10-24 – 2016-10-26 (×4): 40 [IU] via SUBCUTANEOUS

## 2016-10-24 NOTE — Evaluation (Signed)
Occupational Therapy Evaluation Patient Details Name: Mike [name] MRN: [id number] DOB: [birth year] Today's Date: 10/24/2016    History of Present Illness 56 yo male chronically ill homeless diabetic male. Admitted after found unresponsive by bystander with DKA. PMHx: HTN, HLD, DM, depression, anxiety, CVA, Seizure, CKD   Clinical Impression   PTA, pt reports independence with ADL and functional mobility. He reports staying at [employer] most recently. Pt demonstrating cognitive deficits and instability during functional mobility impacting his independence and safety with ADL and functional mobility. He would benefit from continued OT services while admitted to improve independence with ADL prior to D/C. Feel pt would benefit from supervision for safety post-acute D/C and he does report having family in the area.    Follow Up Recommendations  Supervision - Intermittent (would benefit from ALF)    Equipment Recommendations  None recommended by OT    Recommendations for Other Services       Precautions / Restrictions Precautions Precautions: Fall Restrictions Weight Bearing Restrictions: No      Mobility Bed Mobility Overal bed mobility: Modified Independent                Transfers Overall transfer level: Needs assistance   Transfers: Sit to/from Stand Sit to Stand: Supervision         General transfer comment: Supervision for safety    Balance Overall balance assessment: Needs assistance Sitting-balance support: No upper extremity supported;Feet supported Sitting balance-Leahy Scale: Good     Standing balance support: No upper extremity supported;During functional activity Standing balance-Leahy Scale: Fair                             ADL either performed or assessed with clinical judgement   ADL Overall ADL's : Needs assistance/impaired Eating/Feeding: Set up;Sitting   Grooming: Supervision/safety;Standing   Upper Body Bathing:  Set up;Sitting   Lower Body Bathing: Supervison/ safety;Sit to/from stand   Upper Body Dressing : Set up;Sitting   Lower Body Dressing: Supervision/safety;Sit to/from stand   Toilet Transfer: Supervision/safety;Ambulation;Regular Toilet   Toileting- Architect and Hygiene: Supervision/safety;Sit to/from stand       Functional mobility during ADLs: Supervision/safety (close supervision for safety) General ADL Comments: Pt slightly unstable in standing.      Vision Patient Visual Report: No change from baseline Vision Assessment?: No apparent visual deficits Additional Comments: unable to utilize menu but unsure if visual deficit versus decreased literacy     Perception     Praxis      Pertinent Vitals/Pain Pain Assessment: No/denies pain     Hand Dominance     Extremity/Trunk Assessment Upper Extremity Assessment Upper Extremity Assessment: Generalized weakness   Lower Extremity Assessment Lower Extremity Assessment: Generalized weakness   Cervical / Trunk Assessment Cervical / Trunk Assessment: Normal   Communication Communication Communication: No difficulties   Cognition Arousal/Alertness: Awake/alert Behavior During Therapy: Flat affect Overall Cognitive Status: Impaired/Different from baseline Area of Impairment: Orientation;Memory;Following commands;Problem solving                 Orientation Level: Disoriented to;Time (unable to state date)   Memory: Decreased short-term memory (able to recall 2/3 words ) Following Commands: Follows one step commands consistently     Problem Solving: Slow processing General Comments: Pt unable to functionally utilize menu. Slow to respond.    General Comments       Exercises     Shoulder Instructions  Home Living Family/patient expects to be discharged to:: Shelter/Homeless                                        Prior Functioning/Environment Level of Independence:  Independent                 OT Problem List: Decreased strength;Decreased activity tolerance;Impaired balance (sitting and/or standing);Decreased safety awareness;Decreased knowledge of use of DME or AE;Decreased knowledge of precautions      OT Treatment/Interventions: Self-care/ADL training;Therapeutic exercise;Energy conservation;DME and/or AE instruction;Therapeutic activities;Patient/family education;Balance training    OT Goals(Current goals can be found in the care plan section) Acute Rehab OT Goals Patient Stated Goal: to get a vanilla shake OT Goal Formulation: With patient Time For Goal Achievement: 11/07/16 Potential to Achieve Goals: Good ADL Goals Pt Will Perform Grooming: with modified independence;standing Pt Will Perform Lower Body Dressing: with modified independence;sit to/from stand Pt Will Transfer to Toilet: with modified independence;ambulating;bedside commode Pt Will Perform Toileting - Clothing Manipulation and hygiene: with modified independence;sit to/from stand Pt/caregiver will Perform Home Exercise Program: Increased strength;Both right and left upper extremity;With written HEP provided;With Supervision  OT Frequency: Min 2X/week   Barriers to D/C:            Co-evaluation              AM-PAC PT "6 Clicks" Daily Activity     Outcome Measure Help from another person eating meals?: None Help from another person taking care of personal grooming?: A Little Help from another person toileting, which includes using toliet, bedpan, or urinal?: A Little Help from another person bathing (including washing, rinsing, drying)?: A Little Help from another person to put on and taking off regular upper body clothing?: A Little Help from another person to put on and taking off regular lower body clothing?: A Little 6 Click Score: 19   End of Session Nurse Communication: Mobility status (pt requesting ensure)  Activity Tolerance: Patient tolerated  treatment well Patient left: in bed;with call bell/phone within reach  OT Visit Diagnosis: Other abnormalities of gait and mobility (R26.89);Muscle weakness (generalized) (M62.81)                Time: 5409-8119 OT Time Calculation (min): 14 min Charges:  OT General Charges $OT Visit: 1 Visit OT Evaluation $OT Eval Moderate Complexity: 1 Mod G-Codes:     Doristine Section, MS OTR/L  Pager: [phone]   Alline Pio A Ariatna Jester 10/24/2016, 5:24 PM
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
## 2016-10-25 ENCOUNTER — Encounter: Payer: Self-pay | Admitting: Neurology

## 2016-10-25 LAB — BASIC METABOLIC PANEL
ANION GAP: 8 (ref 5–15)
BUN: 21 mg/dL — ABNORMAL HIGH (ref 6–20)
CALCIUM: 9.2 mg/dL (ref 8.9–10.3)
CHLORIDE: 105 mmol/L (ref 101–111)
CO2: 27 mmol/L (ref 22–32)
Creatinine, Ser: 0.72 mg/dL (ref 0.61–1.24)
GFR calc Af Amer: >60 mL/min (ref >60)
GFR calc non Af Amer: >60 mL/min (ref >60)
GLUCOSE: 180 mg/dL — AB (ref 65–99)
Potassium: 3.7 mmol/L (ref 3.5–5.1)
Sodium: 140 mmol/L (ref 135–145)

## 2016-10-25 LAB — GLUCOSE, CAPILLARY
GLUCOSE-CAPILLARY: 245 mg/dL — AB (ref 65–99)
Glucose-Capillary: 220 mg/dL — ABNORMAL HIGH (ref 65–99)

## 2016-10-25 NOTE — Progress Notes (Signed)
Physical Therapy Treatment Patient Details Name: Earley Grobe MRN: [id number] DOB: Jul 13, 1960 Today's Date: 10/25/2016    History of Present Illness 56 yo male chronically ill homeless diabetic male. Admitted after found unresponsive by bystander with DKA. PMHx: HTN, HLD, DM, depression, anxiety, CVA, Seizure, CKD    PT Comments    Patient tolerated increased gait distance this session. Pt continues to demonstrate balance and cognitive deficits increasing risk for falls. Pt with several LOB and 1 instance requiring assistance to recover. Pt reported dizziness in standing. Pt will continue to benefit from further skilled PT services in acute setting to maximize independence and safety with mobility.     Follow Up Recommendations  Supervision for mobility/OOB;Other (comment) (pt would benefit from ALF)     Equipment Recommendations  Rolling walker with 5" wheels    Recommendations for Other Services       Precautions / Restrictions Precautions Precautions: Fall    Mobility  Bed Mobility Overal bed mobility: Independent                Transfers Overall transfer level: Needs assistance Equipment used: Rolling walker (2 wheeled) Transfers: Sit to/from Stand Sit to Stand: Supervision         General transfer comment: no AD to stand; RW used upon standing for balance; supervision for safety; pt reported dizziness in standing  Ambulation/Gait Ambulation/Gait assistance: Min guard;Min assist Ambulation Distance (Feet): 450 Feet Assistive device: Rolling walker (2 wheeled);None Gait Pattern/deviations: Step-through pattern;Decreased stride length;Drifts right/left;Trunk flexed;Wide base of support     General Gait Details: mod cues for posture and safe proximity to RW; pt continues to be unsteady and unable to tolerate challenges without significant gait deviations; pt with multiple LOB but X1 when he required assistance to recover   Stairs             Wheelchair Mobility    Modified Rankin (Stroke Patients Only)       Balance Overall balance assessment: Needs assistance Sitting-balance support: No upper extremity supported;Feet supported Sitting balance-Leahy Scale: Good     Standing balance support: No upper extremity supported;During functional activity Standing balance-Leahy Scale: Fair                 High Level Balance Comments: pt was unable to maintain balance in tandem stand more than 20 seconds R and L LE            Cognition Arousal/Alertness: Awake/alert Behavior During Therapy: Flat affect Overall Cognitive Status: No family/caregiver present to determine baseline cognitive functioning Area of Impairment: Orientation;Memory;Following commands;Problem solving                 Orientation Level: Disoriented to;Time (able to state the month)   Memory: Decreased short-term memory Following Commands: Follows one step commands consistently     Problem Solving: Slow processing General Comments: answered appropriately but slow to respond       Exercises      General Comments General comments (skin integrity, edema, etc.): pt appeared to have difficulty with spatial awareness at times; when going to sit on EOB pt began reaching for surface when still far away       Pertinent Vitals/Pain Pain Assessment: No/denies pain    Home Living                      Prior Function            PT Goals (current goals can now be found  in the care plan section) Acute Rehab PT Goals PT Goal Formulation: With patient Time For Goal Achievement: 11/07/16 Potential to Achieve Goals: Good Progress towards PT goals: Progressing toward goals    Frequency    Min 3X/week      PT Plan Current plan remains appropriate    Co-evaluation              AM-PAC PT "6 Clicks" Daily Activity  Outcome Measure  Difficulty turning over in bed (including adjusting bedclothes, sheets and  blankets)?: None Difficulty moving from lying on back to sitting on the side of the bed? : None Difficulty sitting down on and standing up from a chair with arms (e.g., wheelchair, bedside commode, etc,.)?: None Help needed moving to and from a bed to chair (including a wheelchair)?: None Help needed walking in hospital room?: A Little Help needed climbing 3-5 steps with a railing? : A Little 6 Click Score: 22    End of Session Equipment Utilized During Treatment: Gait belt Activity Tolerance: Patient tolerated treatment well Patient left: with call bell/phone within reach;in bed Nurse Communication: Mobility status PT Visit Diagnosis: Difficulty in walking, not elsewhere classified (R26.2)     Time: 1422-1440 PT Time Calculation (min) (ACUTE ONLY): 18 min  Charges:  $Gait Training: 8-22 mins                    G Codes:       Erline Levine, PTA Pager: [phone]     Carolynne Edouard 10/25/2016, 2:54 PM

## 2016-10-25 NOTE — Clinical Social Work Note (Signed)
Clinical Social Work Assessment  Patient Details  Name: Mike [name] MRN: [id number] Date of Birth: Nov 16, 1960  Date of referral:  10/25/16               Reason for consult:  Discharge Planning, Housing Concerns/Homelessness                Permission sought to share information with:  Family Supports Permission granted to share information::  Yes, Verbal Permission Granted  Name::     Loma Messing  Agency::     Relationship::  friend  Contact Information:  [phone]  Housing/Transportation Living arrangements for the past 2 months:  Homeless Source of Information:  Patient Patient Interpreter Needed:  None Criminal Activity/Legal Involvement Pertinent to Current Situation/Hospitalization:  No - Comment as needed Significant Relationships:  None Lives with:  Other (Comment) (pt is homeless) Do you feel safe going back to the place where you live?  No Need for family participation in patient care:  Yes (Comment)  Care giving concerns: No family at bedside   Social Worker assessment / plan: CSW met patient at bedside to discuss disposition plan. Patient stated he does not have any friends/family in the area and does not have support. Patient stated he is homeless. CSW assisted patient with a list of homeless shelters in the area and encouraged patient to contact them. CSW will assist patient with some clothes and with transportation. Employment status:    Insurance information:  Self Pay (Medicaid Pending) PT Recommendations:  24 Hour Supervision Information / Referral to community resources:  Shelter  Patient/Family's Response to care: Patient has understanding that he needs to take his medication but his unstable housing is causing him to not be abel to comply   Patient/Family's Understanding of and Emotional Response to Diagnosis, Current Treatment, and Prognosis:  Patient seems to have understanding of current hospitalization and limitations  Emotional  Assessment Appearance:  Appears stated age Attitude/Demeanor/Rapport:  Other Affect (typically observed):  Appropriate Orientation:  Oriented to  Time, Oriented to Place, Oriented to Self, Oriented to Situation Alcohol / Substance use:  Not Applicable Psych involvement (Current and /or in the community):  No (Comment)  Discharge Needs  Concerns to be addressed:  Homelessness Readmission within the last 30 days:  No Current discharge risk:  Homeless Barriers to Discharge:  Homeless with medical needs   Wende Neighbors, [name] 10/25/2016, 12:17 PM

## 2016-10-25 NOTE — Progress Notes (Signed)
Occupational Therapy Treatment Patient Details Name: Mike [name] MRN: [id number] DOB: [birth year] Today's Date: 10/25/2016    History of present illness 56 yo male chronically ill homeless diabetic male. Admitted after found unresponsive by bystander with DKA. PMHx: HTN, HLD, DM, depression, anxiety, CVA, Seizure, CKD   OT comments  Pt demonstrating progress toward OT goals. PTA notified OT of concerns with spatial awareness and depth perception. Pt does demonstrate decreased accuracy to target with reaching tasks initially during tasks. Facilitated improved balance and spatial awareness with task to identify and retrieve bottles of self-care items in room. Pt requiring increased time and VC's for processing throughout task. Pt continues to demonstrate instability with functional mobility and decreased safety awareness impacting his ability to participate in ADL. Will continue to follow while admitted.   Follow Up Recommendations  Supervision - Intermittent (would benefit from ALF)    Equipment Recommendations  None recommended by OT    Recommendations for Other Services      Precautions / Restrictions Precautions Precautions: Fall Restrictions Weight Bearing Restrictions: No       Mobility Bed Mobility Overal bed mobility: Independent                Transfers Overall transfer level: Needs assistance Equipment used: Rolling walker (2 wheeled) Transfers: Sit to/from Stand Sit to Stand: Supervision         General transfer comment: RW once ambulating for stability. Mod VC's to appropriately utilize RW.     Balance Overall balance assessment: Needs assistance Sitting-balance support: No upper extremity supported;Feet supported Sitting balance-Leahy Scale: Good     Standing balance support: No upper extremity supported;During functional activity Standing balance-Leahy Scale: Fair                 High Level Balance Comments: pt was unable to maintain  balance in tandem stand more than 20 seconds R and L LE           ADL either performed or assessed with clinical judgement   ADL Overall ADL's : Needs assistance/impaired                         Toilet Transfer: Supervision/safety;RW;Ambulation           Functional mobility during ADLs: Rolling walker;Supervision/safety General ADL Comments: Pt utilizing RW and requiring VC's to safely manage.      Vision   Additional Comments: Noted increased time required on reaching to target. Unsure if this represents a depth perception deficit as pt improves as task continues.     Perception     Praxis      Cognition Arousal/Alertness: Awake/alert Behavior During Therapy: Flat affect Overall Cognitive Status: No family/caregiver present to determine baseline cognitive functioning Area of Impairment: Orientation;Memory;Following commands;Problem solving;Safety/judgement                 Orientation Level: Disoriented to;Time (able to state the month)   Memory: Decreased short-term memory Following Commands: Follows one step commands consistently Safety/Judgement: Decreased awareness of safety   Problem Solving: Slow processing General Comments: Decreased safety. Pt slow to respond throughout session and requiring increased time to process directions.         Exercises     Shoulder Instructions       General Comments pt appeared to have difficulty with spatial awareness at times; when going to sit on EOB pt began reaching for surface when still far away     Pertinent Vitals/  Pain       Pain Assessment: No/denies pain  Home Living                                          Prior Functioning/Environment              Frequency  Min 2X/week        Progress Toward Goals  OT Goals(current goals can now be found in the care plan [name])  Progress towards OT goals: Progressing toward goals  Acute Rehab OT Goals Patient Stated  Goal: to get back to bed at end of session OT Goal Formulation: With patient Time For Goal Achievement: 11/07/16 Potential to Achieve Goals: Good  Plan Discharge plan remains appropriate    Co-evaluation                 AM-PAC PT "6 Clicks" Daily Activity     Outcome Measure   Help from another person eating meals?: None Help from another person taking care of personal grooming?: A Little Help from another person toileting, which includes using toliet, bedpan, or urinal?: A Little Help from another person bathing (including washing, rinsing, drying)?: A Little Help from another person to put on and taking off regular upper body clothing?: A Little Help from another person to put on and taking off regular lower body clothing?: A Little 6 Click Score: 19    End of Session Equipment Utilized During Treatment: Rolling walker  OT Visit Diagnosis: Other abnormalities of gait and mobility (R26.89);Muscle weakness (generalized) (M62.81)   Activity Tolerance Patient tolerated treatment well   Patient Left in bed;with call bell/phone within reach   Nurse Communication Mobility status        Time: 1610-9604 OT Time Calculation (min): 13 min  Charges: OT General Charges $OT Visit: 1 Visit OT Treatments $Therapeutic Activity: 8-22 mins  Mike Section, MS OTR/L  Pager: [phone]    Mike [name] 10/25/2016, 5:03 PM

## 2016-10-25 NOTE — Progress Notes (Signed)
PROGRESS NOTE    Mike [name]  OIZ:[id number] DOB: [birth year] DOA: 10/22/2016 PCP: [name], NP     Brief Narrative:  Mike [name] a 56 y.o.homeless malewith medical history significant for HTN, HLD, CKD, history of seizures, history of remote stroke, migraines, and history of diabetes, insulin-dependent, not on meds over the last 2 weeks in the setting of medicine noncompliance, with a history of DKA, recurrent, last to be and in September 2018; he was found by a bystander on the floor, minimally responsive, very lethargic, for which he called EMS. Upon arrival, his blood sugar was 569. The patient received 10 units of insulin, and then was placed on a glucose stabilizer, and also received 2.5 L of fluid.  He was admitted for acute metabolic encephalopathy due to DKA. He was treated with insulin drip with correction of blood sugar and electrolytes. CM and SW as well as DM Coordinator has been involved in his care as patient is homeless, does not have adequate follow-up due to social issues.   Assessment & Plan:   Active Problems:   Homelessness   Hypertensive urgency   Intermittent palpitations   Tobacco abuse   Abnormal EKG   Type II diabetes mellitus with renal manifestations (HCC)   Protein-calorie malnutrition, severe   DKA (diabetic ketoacidoses) (HCC)   Essential hypertension   CKD (chronic kidney disease)   Acute metabolic encephalopathy -Secondary to DKA -CT head notable for chronic microvascular disease and cerebral atrophy -Resolved   DKA -Secondary to medication noncompliance, homelessness -Ha1c 12.5  -Blood sugar improved -Continue novolog 70/30, SSI    Homelessness -Difficult discharge as patient has no place to start insulin as he is homeless. He has no family or friends who can take him in. Social work and Sports coach consulted. Asked SW to call shelters to see availability   Hyperlipidemia -Statin  Hypertension -Continue  metoprolol -BP stable   Tobacco abuse -Nicotine gum   Acute on Chronic kidney disease stage3 -Cr 2.08 on admission compared to baseline of 1 -Resolved with hydration  History of CVA -Aspirin and Lipitor   DVT prophylaxis: subq hep Code Status: Full Family Communication: updated friend listed as emergency contact Disposition Plan: Difficult discharge as patient has no place to start insulin as he is homeless. He has no family or friends who can take him in. Social work and Sports coach consulted. Asked SW to call shelters to see availability    Consultants:   None  Procedures:   None  Antimicrobials:  Anti-infectives    None       Subjective: No new complaints today.   Objective: Vitals:   10/24/16 2100 10/25/16 0415 10/25/16 0805 10/25/16 1100  BP: (!) 150/86 (!) 147/75 (!) 149/79 (!) 118/59  Pulse: 76  96 76  Resp: 16  18   Temp: 98.3 F (36.8 C) 97.9 F (36.6 C) 98.5 F (36.9 C) (!) 97.3 F (36.3 C)  TempSrc: Oral Oral Oral Oral  SpO2: 98% 98% 99%   Weight:      Height:        Intake/Output Summary (Last 24 hours) at 10/25/16 1605 Last data filed at 10/25/16 1300  Gross per 24 hour  Intake              700 ml  Output              875 ml  Net             -175 ml  Filed Weights   10/22/16 1046 10/22/16 1836 10/23/16 0345  Weight: 63.5 kg (140 lb) 63.5 kg (139 lb 15.9 oz) 64.5 kg (142 lb 4.8 oz)    Examination:  General exam: Appears calm and comfortable  Respiratory system: Clear to auscultation. Respiratory effort normal. Cardiovascular system: S1 & S2 heard, RRR. No JVD, murmurs, rubs, gallops or clicks. No pedal edema. Gastrointestinal system: Abdomen is nondistended, soft and nontender. No organomegaly or masses felt. Normal bowel sounds heard. Central nervous system: Alert and oriented. No focal neurological deficits. Extremities: Symmetric 5 x 5 power. Skin: No rashes, lesions or ulcers Psychiatry: Judgement and insight appear poor.  Mood & affect flat.   Data Reviewed: I have personally reviewed following labs and imaging studies  CBC:  Recent Labs Lab 10/22/16 1057 10/24/16 0350  WBC 8.6 7.6  HGB 13.1 10.8*  HCT 41.2 34.7*  MCV 79.1 77.1*  PLT 366 298   Basic Metabolic Panel:  Recent Labs Lab 10/22/16 2228 10/23/16 0223 10/23/16 0536 10/24/16 0350 10/25/16 0239  NA 143 142 141 138 140  K 4.1 4.6 3.4* 4.1 3.7  CL 110 108 110 105 105  CO2 21* 20* GLUCOSE 145* 203* 206* 308* 180*  BUN 34* 26* 26* 22* 21*  CREATININE 1.23 1.12 1.02 0.89 0.72  CALCIUM 9.2 9.1 9.4 9.2 9.2   GFR: Estimated Creatinine Clearance: 96.3 mL/min (by C-G formula based on SCr of 0.72 mg/dL). Liver Function Tests: No results for input(s): AST, ALT, ALKPHOS, BILITOT, PROT, ALBUMIN in the last 168 hours. No results for input(s): LIPASE, AMYLASE in the last 168 hours. No results for input(s): AMMONIA in the last 168 hours. Coagulation Profile: No results for input(s): INR, PROTIME in the last 168 hours. Cardiac Enzymes:  Recent Labs Lab 10/22/16 1601 10/22/16 1819 10/22/16 2228  CKTOTAL 68  --   --   TROPONINI  --  0.08* 0.09*   BNP (last 3 results) No results for input(s): PROBNP in the last 8760 hours. HbA1C: No results for input(s): HGBA1C in the last 72 hours. CBG:  Recent Labs Lab 10/23/16 0505 10/23/16 0621 10/23/16 0902 10/23/16 1614 10/24/16 1644  GLUCAP 237* 186* 97 166* 410*   Lipid Profile: No results for input(s): CHOL, HDL, LDLCALC, TRIG, CHOLHDL, LDLDIRECT in the last 72 hours. Thyroid Function Tests: No results for input(s): TSH, T4TOTAL, FREET4, T3FREE, THYROIDAB in the last 72 hours. Anemia Panel: No results for input(s): VITAMINB12, FOLATE, FERRITIN, TIBC, IRON, RETICCTPCT in the last 72 hours. Sepsis Labs:  Recent Labs Lab 10/22/16 1608 10/22/16 1819  LATICACIDVEN 1.3 0.9    No results found for this or any previous visit (from the past 240 hour(s)).     Radiology  Studies: No results found.    Scheduled Meds: . aspirin  325 mg Oral Daily  . atorvastatin  40 mg Oral q1800  . feeding supplement (GLUCERNA SHAKE)  237 mL Oral QID  . heparin  5,000 Units Subcutaneous Q8H  . insulin aspart  0-9 Units Subcutaneous TID WC  . insulin aspart protamine- aspart  40 Units Subcutaneous BID WC  . metoprolol tartrate  25 mg Oral BID   Continuous Infusions:   LOS: 3 days    Time spent: 40 minutes   Noralee Stain, DO [facility] [email or web address] Password [email or web address] 10/25/2016, 4:05 PM

## 2016-10-26 ENCOUNTER — Telehealth: Payer: Self-pay

## 2016-10-26 DIAGNOSIS — N189 Chronic kidney disease, unspecified: Secondary | ICD-10-CM

## 2016-10-26 DIAGNOSIS — I129 Hypertensive chronic kidney disease with stage 1 through stage 4 chronic kidney disease, or unspecified chronic kidney disease: Secondary | ICD-10-CM

## 2016-10-26 DIAGNOSIS — F17210 Nicotine dependence, cigarettes, uncomplicated: Secondary | ICD-10-CM

## 2016-10-26 DIAGNOSIS — E1122 Type 2 diabetes mellitus with diabetic chronic kidney disease: Secondary | ICD-10-CM

## 2016-10-26 DIAGNOSIS — F4322 Adjustment disorder with anxiety: Secondary | ICD-10-CM

## 2016-10-26 DIAGNOSIS — Z8673 Personal history of transient ischemic attack (TIA), and cerebral infarction without residual deficits: Secondary | ICD-10-CM

## 2016-10-26 DIAGNOSIS — E785 Hyperlipidemia, unspecified: Secondary | ICD-10-CM

## 2016-10-26 DIAGNOSIS — Z9114 Patient's other noncompliance with medication regimen: Secondary | ICD-10-CM

## 2016-10-26 DIAGNOSIS — Z794 Long term (current) use of insulin: Secondary | ICD-10-CM

## 2016-10-26 LAB — GLUCOSE, CAPILLARY
GLUCOSE-CAPILLARY: 177 mg/dL — AB (ref 65–99)
GLUCOSE-CAPILLARY: 412 mg/dL — AB (ref 65–99)
GLUCOSE-CAPILLARY: 75 mg/dL (ref 65–99)

## 2016-10-26 MED ORDER — GLYBURIDE 5 MG PO TABS
5.0000 mg | ORAL_TABLET | Freq: Two times a day (BID) | ORAL | Status: DC
  Administered 2016-10-26 – 2016-10-27 (×2): 5 mg via ORAL
  Filled 2016-10-26 (×3): qty 1

## 2016-10-26 MED ORDER — INSULIN ASPART 100 UNIT/ML ~~LOC~~ SOLN
0.0000 [IU] | Freq: Three times a day (TID) | SUBCUTANEOUS | Status: DC
  Administered 2016-10-26: 16 [IU] via SUBCUTANEOUS
  Administered 2016-10-27: 3 [IU] via SUBCUTANEOUS
  Administered 2016-10-27: 15 [IU] via SUBCUTANEOUS

## 2016-10-26 MED ORDER — INSULIN ASPART 100 UNIT/ML ~~LOC~~ SOLN: 0.0000 [IU] | Freq: Every day | SUBCUTANEOUS | Status: DC

## 2016-10-26 MED ORDER — METFORMIN HCL 500 MG PO TABS
1000.0000 mg | ORAL_TABLET | Freq: Two times a day (BID) | ORAL | Status: DC
  Administered 2016-10-26 – 2016-10-27 (×2): 1000 mg via ORAL
  Filled 2016-10-26 (×2): qty 2

## 2016-10-26 NOTE — Telephone Encounter (Signed)
Patient no show for appt on 10/24/2016.

## 2016-10-26 NOTE — Progress Notes (Addendum)
PROGRESS NOTE    Mike [name]  ZOX:[id number] DOB: April 10, 1960 DOA: 10/22/2016 PCP: [name], NP     Brief Narrative:  Mike [name] a 56 y.o.homeless malewith medical history significant for HTN, HLD, CKD, history of seizures, history of remote stroke, migraines, and history of diabetes, insulin-dependent, not on meds over the last 2 weeks in the setting of medicine noncompliance, with a history of DKA, recurrent, last to be and in September 2018; he was found by a bystander on the floor, minimally responsive, very lethargic, for which he called EMS. Upon arrival, his blood sugar was 569. The patient received 10 units of insulin, and then was placed on a glucose stabilizer, and also received 2.5 L of fluid.  He was admitted for acute metabolic encephalopathy due to DKA. He was treated with insulin drip with correction of blood sugar and electrolytes. CM and SW as well as DM Coordinator has been involved in his care as patient is homeless, does not have adequate follow-up due to social issues.   Assessment & Plan:   Principal Problem:   DKA (diabetic ketoacidoses) (HCC) Active Problems:   Homelessness   Hypertensive urgency   Intermittent palpitations   Tobacco abuse   Abnormal EKG   Type II diabetes mellitus with renal manifestations (HCC)   Protein-calorie malnutrition, severe   Essential hypertension   CKD (chronic kidney disease)   Acute metabolic encephalopathy -Secondary to DKA -CT head notable for chronic microvascular disease and cerebral atrophy -Improved, but unclear if he has underlying psychiatric conditions or cognitive deficits with decision making capacity. Consulted psych for recommendations. Neurology saw patient last admission in July 2018.   DKA -Secondary to medication noncompliance, homelessness -Ha1c 12.5  -Blood sugar improved -Not sure that insulin is best option for patient as he is homeless and unable to store insulin in  refrigerator. Could trial dual oral agents until his homelessness social issues resolve. Will trial metformin/glyburide BID today. Discussed with diabetic coordinator   Homelessness -Difficult discharge as patient has no place to store insulin as he is homeless. He has no family or friends who can take him in. Social work and Sports coach consulted. Asked SW to call shelters to see availability   Hyperlipidemia -Statin  Hypertension -Continue metoprolol -BP stable   Tobacco abuse -Nicotine gum   Acute on Chronic kidney disease stage3 -Cr 2.08 on admission compared to baseline of 1 -Resolved with hydration  History of CVA -Aspirin and Lipitor   DVT prophylaxis: subq hep Code Status: Full Family Communication: updated friend listed as emergency contact on 10/10  Disposition Plan: Difficult discharge as patient has no place to start insulin as he is homeless. He has no family or friends who can take him in. Social work and Sports coach consulted. Asked SW to call shelters to see availability. Psych consulted today to rule out any underlying psych issues that prevents him from making decisions for himself. SLP for MMSE to see if he has level of dementia.    Consultants:   Psych  Procedures:   None  Antimicrobials:  Anti-infectives    None       Subjective: No new complaints today. I asked him if he made any discharge arrangements with the shelters that SW provided yesterday. The list was set off to the side and patient has not made any phone calls. When asked about his discharge planning, he does not know where he will go. He appears to have very flat affect, slowed response.  Objective: Vitals:   10/25/16 2023 10/26/16 0441 10/26/16 0805 10/26/16 0947  BP: 140/71 (!) 153/82 (!) 141/79 (!) 162/73  Pulse:   97 88  Resp: 18 18    Temp: 98.3 F (36.8 C) 98.2 F (36.8 C) 98 F (36.7 C)   TempSrc: Oral Oral Oral   SpO2:  98% 99%   Weight:      Height:         Intake/Output Summary (Last 24 hours) at 10/26/16 1107 Last data filed at 10/26/16 1018  Gross per 24 hour  Intake              720 ml  Output                0 ml  Net              720 ml   Filed Weights   10/22/16 1046 10/22/16 1836 10/23/16 0345  Weight: 63.5 kg (140 lb) 63.5 kg (139 lb 15.9 oz) 64.5 kg (142 lb 4.8 oz)    Examination:  General exam: Appears calm and comfortable  Respiratory system: Clear to auscultation. Respiratory effort normal. Cardiovascular system: S1 & S2 heard, RRR. No JVD, murmurs, rubs, gallops or clicks. No pedal edema. Gastrointestinal system: Abdomen is nondistended, soft and nontender. No organomegaly or masses felt. Normal bowel sounds heard. Central nervous system: Alert and oriented. No focal neurological deficits. Extremities: Symmetric 5 x 5 power. Skin: No rashes, lesions or ulcers Psychiatry: Judgement and insight appear poor. Mood & affect flat.   Data Reviewed: I have personally reviewed following labs and imaging studies  CBC:  Recent Labs Lab 10/22/16 1057 10/24/16 0350  WBC 8.6 7.6  HGB 13.1 10.8*  HCT 41.2 34.7*  MCV 79.1 77.1*  PLT 366 298   Basic Metabolic Panel:  Recent Labs Lab 10/22/16 2228 10/23/16 0223 10/23/16 0536 10/24/16 0350 10/25/16 0239  NA 143 142 141 138 140  K 4.1 4.6 3.4* 4.1 3.7  CL 110 108 110 105 105  CO2 21* 20* GLUCOSE 145* 203* 206* 308* 180*  BUN 34* 26* 26* 22* 21*  CREATININE 1.23 1.12 1.02 0.89 0.72  CALCIUM 9.2 9.1 9.4 9.2 9.2   GFR: Estimated Creatinine Clearance: 96.3 mL/min (by C-G formula based on SCr of 0.72 mg/dL). Liver Function Tests: No results for input(s): AST, ALT, ALKPHOS, BILITOT, PROT, ALBUMIN in the last 168 hours. No results for input(s): LIPASE, AMYLASE in the last 168 hours. No results for input(s): AMMONIA in the last 168 hours. Coagulation Profile: No results for input(s): INR, PROTIME in the last 168 hours. Cardiac Enzymes:  Recent Labs Lab  10/22/16 1601 10/22/16 1819 10/22/16 2228  CKTOTAL 68  --   --   TROPONINI  --  0.08* 0.09*   BNP (last 3 results) No results for input(s): PROBNP in the last 8760 hours. HbA1C: No results for input(s): HGBA1C in the last 72 hours. CBG:  Recent Labs Lab 10/23/16 1614 10/24/16 1644 10/25/16 1610 10/25/16 2016 10/26/16 0559  GLUCAP 166* 410* 220* 245* 177*   Lipid Profile: No results for input(s): CHOL, HDL, LDLCALC, TRIG, CHOLHDL, LDLDIRECT in the last 72 hours. Thyroid Function Tests: No results for input(s): TSH, T4TOTAL, FREET4, T3FREE, THYROIDAB in the last 72 hours. Anemia Panel: No results for input(s): VITAMINB12, FOLATE, FERRITIN, TIBC, IRON, RETICCTPCT in the last 72 hours. Sepsis Labs:  Recent Labs Lab 10/22/16 1608 10/22/16 1819  LATICACIDVEN 1.3 0.9    No  results found for this or any previous visit (from the past 240 hour(s)).     Radiology Studies: No results found.    Scheduled Meds: . aspirin  325 mg Oral Daily  . atorvastatin  40 mg Oral q1800  . feeding supplement (GLUCERNA SHAKE)  237 mL Oral QID  . glyBURIDE  5 mg Oral BID WC  . heparin  5,000 Units Subcutaneous Q8H  . insulin aspart  0-15 Units Subcutaneous TID WC  . insulin aspart  0-5 Units Subcutaneous QHS  . metFORMIN  1,000 mg Oral BID WC  . metoprolol tartrate  25 mg Oral BID   Continuous Infusions:   LOS: 4 days    Time spent: 30 minutes   Noralee Stain, DO [facility] [email or web address] Password [email or web address] 10/26/2016, 11:07 AM
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
## 2016-10-26 NOTE — Progress Notes (Signed)
CSW met with patient at bedside. Patient responsive but with flat affect. Patient has list of shelters provided by previous CSW. CSW gave patient shirts and pants - no shoes available in patient's size in CSW supplies. CSW called [facility] shelter and they have no beds available. CSW left messages with [facility] and [facility], no calls back. CSW to follow while patient admitted; patient has resources for shelters and [facility] and can follow up independently.   , LCSWA [phone]  

## 2016-10-26 NOTE — Progress Notes (Signed)
Inpatient Diabetes Program Recommendations  AACE/ADA: New Consensus Statement on Inpatient Glycemic Control (2015)  Target Ranges:  Prepandial:   less than 140 mg/dL      Peak postprandial:   less than 180 mg/dL (1-2 hours)      Critically ill patients:  140 - 180 mg/dL   Lab Results  Component Value Date   GLUCAP 177 (H) 10/26/2016   HGBA1C 12.5 (H) 10/22/2016    Spoke with Dr. Alvino Chapel in regards to discharge planning. Patient is on a large amount of insulin. A1c level very high. With patient being homeless insulin properties might have changed and would not be as effective during the summer. Each oral DM medication potentially can lower the A1c 1-2%. Patient seems very resistant on 70/30 40 units BID currently. Could start patient on 2 oral medications and monitor glucose trend. Metformin and glyburide should each be $4 a piece.  Thanks,  Christena Deem RN, MSN, [facility] Inpatient Diabetes Coordinator Team Pager [phone] (8a-5p)

## 2016-10-27 MED ORDER — BLOOD GLUCOSE METER KIT: PACK | 0 refills | Status: DC

## 2016-10-27 MED ORDER — ATORVASTATIN CALCIUM 40 MG PO TABS: 40.0000 mg | ORAL_TABLET | Freq: Every day | ORAL | 0 refills | Status: DC

## 2016-10-27 MED ORDER — METFORMIN HCL 1000 MG PO TABS: 1000.0000 mg | ORAL_TABLET | Freq: Two times a day (BID) | ORAL | 0 refills | Status: DC

## 2016-10-27 MED ORDER — ASPIRIN 325 MG PO TABS: 325.0000 mg | ORAL_TABLET | Freq: Every day | ORAL | 0 refills | Status: DC

## 2016-10-27 MED ORDER — METOPROLOL TARTRATE 50 MG PO TABS: 50.0000 mg | ORAL_TABLET | Freq: Two times a day (BID) | ORAL | 0 refills | Status: DC

## 2016-10-27 MED ORDER — GLYBURIDE 5 MG PO TABS: 5.0000 mg | ORAL_TABLET | Freq: Two times a day (BID) | ORAL | 0 refills | Status: DC

## 2016-10-27 NOTE — Care Management Note (Addendum)
Case Management Note Mike [name], BSN Unit 4E-Case Manager [phone]   Patient Details  Name: Mike [name] MRN: [id number] Date of Birth: 22-Jun-1960  Subjective/Objective:   Pt admitted with  DKA                Action/Plan: Pt known to this CM- pt homeless- has been [facility] in past- CSW to follow for homelessness issues- pt is also well known to [facility]- and has followed there in f/u with Mike [name]- pt has been assisted with [facility] -July/18- and is not eligible at this time for [facility] Mike [name] assistance- pt gets his medications from [facility].  CM will follow for d/c needs.   Expected Discharge Date:  10/27/16               Expected Discharge Plan:  Homeless Shelter  In-House Referral:  Clinical Social Work  Discharge planning Services  CM Consult  Post Acute Care Choice:  NA Choice offered to:  NA  DME Arranged:    DME Agency:     HH Arranged:    HH Agency:     Status of Service:  Completed, signed off  If discussed at [employer], dates discussed:    Discharge Disposition: homeless shelter   Additional Comments:  10/27/16- 1115- Mike [name], CM- pt for discharge today- spoke again with pt regarding IRC and HD for f/u and assistance with getting medications- pt states he is familiar with both and understands how to get his medications at the HD. CSW has provided pt with clothes and shelter list. Have notified CSW for bus pass.   Mike Span, [name] 10/27/2016, 11:18 AM

## 2016-10-27 NOTE — Discharge Instructions (Signed)

## 2016-10-27 NOTE — Discharge Summary (Signed)
Physician Discharge Summary  Mike [name] ZOX:[id number] DOB: 17-Jan-1960 DOA: 10/22/2016  PCP: [name], NP  Admit date: 10/22/2016 Discharge date: 10/27/2016  Admitted From: Home/homeless Disposition:  Home/homeless. List of resources of shelters and [employer] provided by SW. He may pick up his medications at [facility]  Recommendations for Outpatient Follow-up:  1. Follow up with PCP in 1 week  Discharge Condition: Stable, improved CODE STATUS: Full  Diet recommendation: Carb modified   Brief/Interim Summary: Mike Geniusis a 56 y.o.homeless malewith medical history significant for HTN, HLD, CKD, history of seizures, history of remote stroke, migraines, and history of diabetes, insulin-dependent, not on meds over the last 2 weeks in the setting of medicine noncompliance, with a history of DKA, recurrent, last to be and in September 2018; he wasfound by a bystander on the floor, minimally responsive, very lethargic, for which he called EMS. Upon arrival, his blood sugar was 569. The patient received 10 units of insulin, and then was placed on a glucose stabilizer, and also received 2.5 L of fluid.  He was admitted for acute metabolic encephalopathy due to DKA. He was treated with insulin drip with correction of blood sugar and electrolytes. CM and SW as well as DM Coordinator has been involved in his care as patient is homeless, does not have adequate follow-up due to social issues. He was also evaluated by Psychiatry who has determined that he has capacity to make medical and living arrangement decisions. He will be discharged with oral diabetic regimen as patient does not have reliable place to store insulin due to social issues.   Discharge Diagnoses:  Principal Problem:   DKA (diabetic ketoacidoses) ([place]) Active Problems:   Homelessness   Hypertensive urgency   Intermittent palpitations   Tobacco abuse   Abnormal EKG    Type II diabetes mellitus with renal manifestations (HCC)   Protein-calorie malnutrition, severe   Essential hypertension   CKD (chronic kidney disease)   Acute metabolic encephalopathy -Secondary to DKA -CT head notable for chronic microvascular disease and cerebral atrophy -Psych evaluated patient, patient has no psych issues and does have capacity to make decisions   DKA -Secondary to medication noncompliance, homelessness -Ha1c 12.5  -Blood sugar improved -Not sure that insulin is best option for patient as he is homeless and unable to store insulin in refrigerator. Could trial dual oral agents until his homelessness social issues resolve. Will trial metformin/glyburide BID. Discussed with diabetic coordinator   Homelessness -Difficult discharge as patient has no place to store insulin as he is homeless. He has no family or friends who can take him in. Social work and Tourist information centre manager consulted. Asked SW to call shelters to see availability, resources provided   Hyperlipidemia -Continue statin   Hypertension -Continue metoprolol -BP stable   Tobacco abuse -Nicotine gum   Acute on Chronic kidney disease stage3 -Cr 2.08 on admission compared to baseline of 1 -Resolved with hydration  History of CVA -Aspirin and Lipitor   Discharge Instructions  Discharge Instructions    Call MD for:  difficulty breathing, headache or visual disturbances    Complete by:  As directed    Call MD for:  extreme fatigue    Complete by:  As directed    Call MD for:  hives    Complete by:  As directed    Call MD for:  persistant dizziness or light-headedness    Complete by:  As directed    Call MD for:  persistant  nausea and vomiting    Complete by:  As directed    Call MD for:  severe uncontrolled pain    Complete by:  As directed    Call MD for:  temperature >100.4    Complete by:  As directed    Diet Carb Modified    Complete by:  As directed    Discharge instructions     Complete by:  As directed    You were cared for by a hospitalist during your hospital stay. If you have any questions about your discharge medications or the care you received while you were in the hospital after you are discharged, you can call the unit and asked to speak with the hospitalist on call if the hospitalist that took care of you is not available. Once you are discharged, your primary care physician will handle any further medical issues. Please note that NO REFILLS for any discharge medications will be authorized once you are discharged, as it is imperative that you return to your primary care physician (or establish a relationship with a primary care physician if you do not have one) for your aftercare needs so that they can reassess your need for medications and monitor your lab values.   Increase activity slowly    Complete by:  As directed      Allergies as of 10/27/2016      Reactions   Ibuprofen Nausea And Vomiting, Other (See Comments)   Reaction:  Bloating    Tylenol [acetaminophen] Nausea And Vomiting, Other (See Comments)   Reaction:  Bloating       Medication List    STOP taking these medications   hydrALAZINE 50 MG tablet Commonly known as:  APRESOLINE   insulin aspart 100 UNIT/ML injection Commonly known as:  novoLOG   insulin aspart protamine- aspart (70-30) 100 UNIT/ML injection Commonly known as:  NOVOLOG MIX 70/30   insulin NPH-regular Human (70-30) 100 UNIT/ML injection Commonly known as:  NOVOLIN 70/30   isosorbide dinitrate 20 MG tablet Commonly known as:  ISORDIL     TAKE these medications   aspirin 325 MG tablet Take 1 tablet (325 mg total) by mouth daily.   atorvastatin 40 MG tablet Commonly known as:  LIPITOR Take 1 tablet (40 mg total) by mouth daily.   blood glucose meter kit and supplies Dispense based on patient and insurance preference. Use up to four times daily as directed. (FOR ICD-9 250.00, 250.01).   glyBURIDE 5 MG  tablet Commonly known as:  DIABETA Take 1 tablet (5 mg total) by mouth 2 (two) times daily with a meal.   metFORMIN 1000 MG tablet Commonly known as:  GLUCOPHAGE Take 1 tablet (1,000 mg total) by mouth 2 (two) times daily with a meal. What changed:  medication strength  how much to take   metoprolol tartrate 50 MG tablet Commonly known as:  LOPRESSOR Take 1 tablet (50 mg total) by mouth 2 (two) times daily. What changed:  Another medication with the same name was removed. Continue taking this medication, and follow the directions you see here.      Follow-up Information    [name], Audrea Muscat, NP Follow up.   Why:  F/u at [place] information: [place] [address] [phone]        [facility] [place] Follow up.   Specialty:  [place] Why:  go to get assistance with your medications.  Contact information: [address]  [address] [place] [address] [phone]          Allergies  Allergen Reactions  . Ibuprofen Nausea And Vomiting and Other (See Comments)    Reaction:  Bloating   . Tylenol [Acetaminophen] Nausea And Vomiting and Other (See Comments)    Reaction:  Bloating     Consultations:  None   Procedures/Studies: Dg Chest 2 View  Result Date: 10/22/2016 CLINICAL DATA:  56 year old male with decreased level consciousness. EXAM: CHEST  2 VIEW COMPARISON:  Chest x-ray 07/28/2016. FINDINGS: Lung volumes are normal. No consolidative airspace disease. No pleural effusions. No pneumothorax. No pulmonary nodule or mass noted. Pulmonary vasculature and the cardiomediastinal silhouette are within normal limits. IMPRESSION: No radiographic evidence of acute cardiopulmonary disease. Electronically Signed   By: Vinnie Langton M.D.   On: 10/22/2016 15:14   Ct Head Wo Contrast  Result Date: 10/22/2016 CLINICAL DATA:  Patient's presentation today consistent with hyperglycemia and DKA EXAM: CT HEAD WITHOUT  CONTRAST TECHNIQUE: Contiguous axial images were obtained from the base of the skull through the vertex without intravenous contrast. COMPARISON:  None. FINDINGS: Brain: No evidence of acute infarction, hemorrhage, hydrocephalus, extra-axial collection or mass lesion/mass effect. Generalized cerebral atrophy. Periventricular white matter low attenuation likely secondary to microangiopathy. Vascular: No hyperdense vessel or unexpected calcification. Skull: No osseous abnormality. Sinuses/Orbits: Visualized paranasal sinuses are clear. Visualized mastoid sinuses are clear. Visualized orbits demonstrate no focal abnormality. Other: None IMPRESSION: 1. No acute intracranial pathology. 2. Chronic microvascular disease and cerebral atrophy. Electronically Signed   By: Kathreen Devoid   On: 10/22/2016 14:50       Discharge Exam: Vitals:   10/27/16 0510 10/27/16 0600  BP: (!) 171/77 (!) 164/65  Pulse: 74   Resp: 16   Temp: 98.4 F (36.9 C)   SpO2: 100%    Vitals:   10/26/16 1500 10/26/16 2045 10/27/16 0510 10/27/16 0600  BP: 140/63 (!) 141/81 (!) 171/77 (!) 164/65  Pulse: 83 78 74   Resp:  17 16   Temp: (!) 97.5 F (36.4 C) 98.5 F (36.9 C) 98.4 F (36.9 C)   TempSrc: Oral Oral Axillary   SpO2: 99% 99% 100%   Weight:      Height:        General: Pt is alert, awake, not in acute distress Cardiovascular: RRR, S1/S2 + Respiratory: CTA bilaterally, no wheezing, no rhonchi Abdominal: Soft, NT, ND, bowel sounds + Extremities: no edema, no cyanosis    The results of significant diagnostics from this hospitalization (including imaging, microbiology, ancillary and laboratory) are listed below for reference.     Microbiology: No results found for this or any previous visit (from the past 240 hour(s)).   Labs: BNP (last 3 results) No results for input(s): BNP in the last 8760 hours. Basic Metabolic Panel:  Recent Labs Lab 10/22/16 2228 10/23/16 0223 10/23/16 0536 10/24/16 0350  10/25/16 0239  NA 143 142 141 138 140  K 4.1 4.6 3.4* 4.1 3.7  CL 110 108 110 105 105  CO2 21* 20* '24 25 27  '$ GLUCOSE 145* 203* 206* 308* 180*  BUN 34* 26* 26* 22* 21*  CREATININE 1.23 1.12 1.02 0.89 0.72  CALCIUM 9.2 9.1 9.4 9.2 9.2   Liver Function Tests: No results for input(s): AST, ALT, ALKPHOS, BILITOT, PROT, ALBUMIN in the last 168 hours. No results for input(s): LIPASE, AMYLASE in the last 168 hours. No results for input(s): AMMONIA in the last 168 hours. CBC:  Recent Labs Lab 10/22/16  1057 10/24/16 0350  WBC 8.6 7.6  HGB 13.1 10.8*  HCT 41.2 34.7*  MCV 79.1 77.1*  PLT 366 298   Cardiac Enzymes:  Recent Labs Lab 10/22/16 1601 10/22/16 1819 10/22/16 2228  CKTOTAL 68  --   --   TROPONINI  --  0.08* 0.09*   BNP: Invalid input(s): POCBNP CBG:  Recent Labs Lab 10/25/16 1610 10/25/16 2016 10/26/16 0559 10/26/16 1623 10/26/16 2122  GLUCAP 220* 245* 177* 412* 75   D-Dimer No results for input(s): DDIMER in the last 72 hours. Hgb A1c No results for input(s): HGBA1C in the last 72 hours. Lipid Profile No results for input(s): CHOL, HDL, LDLCALC, TRIG, CHOLHDL, LDLDIRECT in the last 72 hours. Thyroid function studies No results for input(s): TSH, T4TOTAL, T3FREE, THYROIDAB in the last 72 hours.  Invalid input(s): FREET3 Anemia work up No results for input(s): VITAMINB12, FOLATE, FERRITIN, TIBC, IRON, RETICCTPCT in the last 72 hours. Urinalysis    Component Value Date/Time   COLORURINE YELLOW 10/22/2016 1222   APPEARANCEUR CLEAR 10/22/2016 1222   LABSPEC 1.021 10/22/2016 1222   PHURINE 5.0 10/22/2016 1222   GLUCOSEU >=500 (A) 10/22/2016 1222   HGBUR SMALL (A) 10/22/2016 1222   BILIRUBINUR NEGATIVE 10/22/2016 1222   KETONESUR 80 (A) 10/22/2016 1222   PROTEINUR 100 (A) 10/22/2016 1222   UROBILINOGEN 1.0 02/07/2011 1605   NITRITE NEGATIVE 10/22/2016 1222   LEUKOCYTESUR NEGATIVE 10/22/2016 1222   Sepsis Labs Invalid input(s): PROCALCITONIN,  WBC,   LACTICIDVEN Microbiology No results found for this or any previous visit (from the past 240 hour(s)).   Time coordinating discharge: 40 minutes  SIGNED:  Dessa Phi, DO [facility] Pager [phone]  If 7PM-7AM, please contact night-coverage [email or web address] Password [email or web address] 10/27/2016, 8:53 AM

## 2016-10-27 NOTE — Progress Notes (Signed)
CSW provided bus passes for patient- left on chart and informed nurse- for when ready for discharge. CSW called [facility] shelter again and they continue to not have beds available. CSW again left messages for [employer] and [employer]. Patient has list of shelters and is familiar with [employer]. CSW signing off.  Abigail Butts, LCSWA [phone]

## 2016-10-27 NOTE — Progress Notes (Addendum)
Physical Therapy Treatment Patient Details Name: Mike [name] MRN: [id number] DOB: 1960-12-07 Today's Date: 10/27/2016    History of Present Illness 56 yo male chronically ill homeless diabetic male. Admitted after found unresponsive by bystander with DKA. PMHx: HTN, HLD, DM, depression, anxiety, CVA, Seizure, CKD    PT Comments    Pt with steady mobility progress. Continued risk for falls due to poor safety awareness and decreased cognition. RW needed for increased stability during ambulation. Cueing needed for safe RW management. Pt would greatly benefit from assisted living facility.   Follow Up Recommendations  Supervision for mobility/OOB;Other (comment) (Pt would benefit from ALF)     Equipment Recommendations  Rolling walker with 5" wheels    Recommendations for Other Services       Precautions / Restrictions Precautions Precautions: Fall Restrictions Weight Bearing Restrictions: No    Mobility  Bed Mobility Overal bed mobility: Independent                Transfers   Equipment used: Ambulation equipment used   Sit to Stand: Supervision         General transfer comment: increased time to stabilize initial standing balance.  Ambulation/Gait Ambulation/Gait assistance: Min guard Ambulation Distance (Feet): 500 Feet Assistive device: Rolling walker (2 wheeled) Gait Pattern/deviations: Step-through pattern;Trunk flexed Gait velocity: decreased Gait velocity interpretation: Below normal speed for age/gender General Gait Details: verbal cues to stay close to [employer]            Wheelchair Mobility    Modified Rankin (Stroke Patients Only)       Balance   Sitting-balance support: No upper extremity supported;Feet supported Sitting balance-Leahy Scale: Good     Standing balance support: No upper extremity supported;During functional activity Standing balance-Leahy Scale: Fair                              Cognition  Arousal/Alertness: Awake/alert Behavior During Therapy: WFL for tasks assessed/performed Overall Cognitive Status: No family/caregiver present to determine baseline cognitive functioning                   Orientation Level: Disoriented to;Time   Memory: Decreased short-term memory Following Commands: Follows one step commands consistently Safety/Judgement: Decreased awareness of safety   Problem Solving: Slow processing General Comments: Slow to respond. Increased time to process directions.      Exercises      General Comments        Pertinent Vitals/Pain Pain Assessment: No/denies pain    Home Living                      Prior Function            PT Goals (current goals can now be found in the care plan section) Acute Rehab PT Goals Patient Stated Goal: not stated PT Goal Formulation: With patient Time For Goal Achievement: 11/07/16 Potential to Achieve Goals: Good Progress towards PT goals: Progressing toward goals    Frequency    Min 3X/week      PT Plan Current plan remains appropriate    Co-evaluation              AM-PAC PT "6 Clicks" Daily Activity  Outcome Measure  Difficulty turning over in bed (including adjusting bedclothes, sheets and blankets)?: None Difficulty moving from lying on back to sitting on the side of the bed? : None Difficulty sitting down  on and standing up from a chair with arms (e.g., wheelchair, bedside commode, etc,.)?: None Help needed moving to and from a bed to chair (including a wheelchair)?: None Help needed walking in hospital room?: A Little Help needed climbing 3-5 steps with a railing? : A Little 6 Click Score: 22    End of Session Equipment Utilized During Treatment: Gait belt Activity Tolerance: Patient tolerated treatment well Patient left: with call bell/phone within reach;in bed Nurse Communication: Mobility status PT Visit Diagnosis: Difficulty in walking, not elsewhere classified  (R26.2)     Time: 1610-9604 PT Time Calculation (min) (ACUTE ONLY): 21 min  Charges:  $Gait Training: 8-22 mins                    G Codes:       Mike [name], PT  Office # [phone] Pager [phone]    Ilda Foil 10/27/2016, 10:56 AM

## 2016-10-27 NOTE — Consult Note (Signed)
SLP Cancellation Note  Patient Details Name: Mike [name] MRN: [id number] DOB: [birth year]   Cancelled treatment:       Orders for completion of MMSE have been cancelled. Please reconsult if needs arise.  Celia B. Murvin Natal [facility], CCC-SLP Speech Language Pathologist [phone]  Leigh Aurora 10/27/2016, 9:34 AM

## 2016-11-02 ENCOUNTER — Emergency Department (HOSPITAL_COMMUNITY)
Admission: EM | Admit: 2016-11-02 | Discharge: 2016-11-02 | Disposition: A | Discharge status: Home / Self Care | Payer: Self-pay | Attending: Emergency Medicine | Admitting: Emergency Medicine

## 2016-11-02 ENCOUNTER — Encounter (HOSPITAL_COMMUNITY): Payer: Self-pay | Admitting: Emergency Medicine

## 2016-11-02 DIAGNOSIS — Z590 Homelessness: Secondary | ICD-10-CM | POA: Insufficient documentation

## 2016-11-02 DIAGNOSIS — F17210 Nicotine dependence, cigarettes, uncomplicated: Secondary | ICD-10-CM | POA: Insufficient documentation

## 2016-11-02 DIAGNOSIS — E1165 Type 2 diabetes mellitus with hyperglycemia: Secondary | ICD-10-CM | POA: Insufficient documentation

## 2016-11-02 DIAGNOSIS — G8929 Other chronic pain: Secondary | ICD-10-CM | POA: Insufficient documentation

## 2016-11-02 DIAGNOSIS — N182 Chronic kidney disease, stage 2 (mild): Secondary | ICD-10-CM | POA: Insufficient documentation

## 2016-11-02 DIAGNOSIS — I129 Hypertensive chronic kidney disease with stage 1 through stage 4 chronic kidney disease, or unspecified chronic kidney disease: Secondary | ICD-10-CM | POA: Insufficient documentation

## 2016-11-02 DIAGNOSIS — R739 Hyperglycemia, unspecified: Secondary | ICD-10-CM

## 2016-11-02 DIAGNOSIS — Z9119 Patient's noncompliance with other medical treatment and regimen: Secondary | ICD-10-CM | POA: Insufficient documentation

## 2016-11-02 LAB — CBC WITH DIFFERENTIAL/PLATELET
Basophils Absolute: 0 10*3/uL (ref 0.0–0.1)
Basophils Relative: 0 %
Eosinophils Absolute: 0.1 10*3/uL (ref 0.0–0.7)
Eosinophils Relative: 1 %
HCT: 34.2 % — ABNORMAL LOW (ref 39.0–52.0)
Hemoglobin: 10.6 g/dL — ABNORMAL LOW (ref 13.0–17.0)
Lymphocytes Relative: 17 %
Lymphs Abs: 1.3 10*3/uL (ref 0.7–4.0)
MCH: 25 pg — ABNORMAL LOW (ref 26.0–34.0)
MCHC: 31 g/dL (ref 30.0–36.0)
MCV: 80.7 fL (ref 78.0–100.0)
Monocytes Absolute: 0.7 10*3/uL (ref 0.1–1.0)
Monocytes Relative: 9 %
Neutro Abs: 5.4 10*3/uL (ref 1.7–7.7)
Neutrophils Relative %: 73 %
Platelets: 434 10*3/uL — ABNORMAL HIGH (ref 150–400)
RBC: 4.24 MIL/uL (ref 4.22–5.81)
RDW: 13.9 % (ref 11.5–15.5)
WBC: 7.5 10*3/uL (ref 4.0–10.5)

## 2016-11-02 LAB — BASIC METABOLIC PANEL
Anion gap: 20 — ABNORMAL HIGH (ref 5–15)
Anion gap: 10 (ref 5–15)
BUN: 25 mg/dL — ABNORMAL HIGH (ref 6–20)
BUN: 20 mg/dL (ref 6–20)
CO2: 22 mmol/L (ref 22–32)
CO2: 26 mmol/L (ref 22–32)
Calcium: 9.6 mg/dL (ref 8.9–10.3)
Calcium: 9.1 mg/dL (ref 8.9–10.3)
Chloride: 97 mmol/L — ABNORMAL LOW (ref 101–111)
Chloride: 108 mmol/L (ref 101–111)
Creatinine, Ser: 1.14 mg/dL (ref 0.61–1.24)
Creatinine, Ser: 0.88 mg/dL (ref 0.61–1.24)
GFR calc Af Amer: >60 mL/min (ref >60)
GFR calc Af Amer: >60 mL/min (ref >60)
GFR calc non Af Amer: >60 mL/min (ref >60)
GFR calc non Af Amer: >60 mL/min (ref >60)
Glucose, Bld: 711 mg/dL (ref 65–99)
Glucose, Bld: 216 mg/dL — ABNORMAL HIGH (ref 65–99)
Potassium: 5 mmol/L (ref 3.5–5.1)
Potassium: 3.6 mmol/L (ref 3.5–5.1)
Sodium: 139 mmol/L (ref 135–145)
Sodium: 144 mmol/L (ref 135–145)

## 2016-11-02 LAB — URINALYSIS, ROUTINE W REFLEX MICROSCOPIC
Bilirubin Urine: NEGATIVE
Glucose, UA: >=500 mg/dL — AB
Hgb urine dipstick: NEGATIVE
Ketones, ur: 80 mg/dL — AB
Leukocytes, UA: NEGATIVE
Nitrite: NEGATIVE
Protein, ur: NEGATIVE mg/dL
Specific Gravity, Urine: 1.027 (ref 1.005–1.030)
pH: 5 (ref 5.0–8.0)

## 2016-11-02 LAB — SALICYLATE LEVEL: Salicylate Lvl: <7 mg/dL (ref 2.8–30.0)

## 2016-11-02 LAB — I-STAT CHEM 8, ED
BUN: 25 mg/dL — ABNORMAL HIGH (ref 6–20)
Calcium, Ion: 1.23 mmol/L (ref 1.15–1.40)
Chloride: 99 mmol/L — ABNORMAL LOW (ref 101–111)
Creatinine, Ser: 0.8 mg/dL (ref 0.61–1.24)
Glucose, Bld: 668 mg/dL (ref 65–99)
HCT: 37 % — ABNORMAL LOW (ref 39.0–52.0)
Hemoglobin: 12.6 g/dL — ABNORMAL LOW (ref 13.0–17.0)
Potassium: 5.1 mmol/L (ref 3.5–5.1)
Sodium: 138 mmol/L (ref 135–145)
TCO2: 22 mmol/L (ref 22–32)

## 2016-11-02 LAB — BLOOD GAS, VENOUS
Acid-base deficit: 4.1 mmol/L — ABNORMAL HIGH (ref 0.0–2.0)
BICARBONATE: 20.1 mmol/L (ref 20.0–28.0)
O2 Saturation: 88.2 %
PH VEN: 7.367 (ref 7.250–7.430)
PO2 VEN: 59.3 mmHg — AB (ref 32.0–45.0)
Patient temperature: 98.6
pCO2, Ven: 36 mmHg — ABNORMAL LOW (ref 44.0–60.0)

## 2016-11-02 LAB — CBG MONITORING, ED
Glucose-Capillary: >600 mg/dL (ref 65–99)
Glucose-Capillary: 419 mg/dL — ABNORMAL HIGH (ref 65–99)
Glucose-Capillary: 303 mg/dL — ABNORMAL HIGH (ref 65–99)
Glucose-Capillary: 227 mg/dL — ABNORMAL HIGH (ref 65–99)
Glucose-Capillary: 185 mg/dL — ABNORMAL HIGH (ref 65–99)

## 2016-11-02 LAB — CK: Total CK: 83 U/L (ref 49–397)

## 2016-11-02 LAB — I-STAT CG4 LACTIC ACID, ED: Lactic Acid, Venous: 1.37 mmol/L (ref 0.5–1.9)

## 2016-11-02 LAB — I-STAT TROPONIN, ED: Troponin i, poc: 0 ng/mL (ref 0.00–0.08)

## 2016-11-02 MED ORDER — METOPROLOL TARTRATE 50 MG PO TABS: 50.0000 mg | ORAL_TABLET | Freq: Two times a day (BID) | ORAL | 0 refills | Status: DC

## 2016-11-02 MED ORDER — DEXTROSE-NACL 5-0.45 % IV SOLN: INTRAVENOUS | Status: DC

## 2016-11-02 MED ORDER — BACITRACIN ZINC 500 UNIT/GM EX OINT
TOPICAL_OINTMENT | CUTANEOUS | Status: AC
  Administered 2016-11-02: 17:00:00
  Filled 2016-11-02: qty 0.9

## 2016-11-02 MED ORDER — DEXTROSE-NACL 5-0.45 % IV SOLN
INTRAVENOUS | Status: DC
  Administered 2016-11-02: 19:00:00 via INTRAVENOUS

## 2016-11-02 MED ORDER — SODIUM CHLORIDE 0.9 % IV BOLUS (SEPSIS)
1000.0000 mL | Freq: Once | INTRAVENOUS | Status: AC
  Administered 2016-11-02: 1000 mL via INTRAVENOUS

## 2016-11-02 MED ORDER — SODIUM CHLORIDE 0.9 % IV SOLN
INTRAVENOUS | Status: DC
  Administered 2016-11-02: 17:00:00 via INTRAVENOUS

## 2016-11-02 MED ORDER — METOPROLOL TARTRATE 25 MG PO TABS
50.0000 mg | ORAL_TABLET | Freq: Once | ORAL | Status: AC
  Administered 2016-11-02: 50 mg via ORAL
  Filled 2016-11-02: qty 2

## 2016-11-02 MED ORDER — METFORMIN HCL 1000 MG PO TABS: 1000.0000 mg | ORAL_TABLET | Freq: Two times a day (BID) | ORAL | 0 refills | Status: DC

## 2016-11-02 MED ORDER — METFORMIN HCL 500 MG PO TABS
1000.0000 mg | ORAL_TABLET | Freq: Once | ORAL | Status: AC
  Administered 2016-11-02: 1000 mg via ORAL
  Filled 2016-11-02: qty 2

## 2016-11-02 MED ORDER — GLYBURIDE 5 MG PO TABS: 5.0000 mg | ORAL_TABLET | Freq: Two times a day (BID) | ORAL | 0 refills | Status: DC

## 2016-11-02 MED ORDER — ASPIRIN 325 MG PO TABS: 325.0000 mg | ORAL_TABLET | Freq: Every day | ORAL | 0 refills | Status: DC

## 2016-11-02 MED ORDER — SODIUM CHLORIDE 0.9 % IV SOLN
INTRAVENOUS | Status: DC
  Administered 2016-11-02: 5.4 [IU]/h via INTRAVENOUS
  Filled 2016-11-02: qty 1

## 2016-11-02 MED ORDER — ATORVASTATIN CALCIUM 40 MG PO TABS: 40.0000 mg | ORAL_TABLET | Freq: Every day | ORAL | 0 refills | Status: DC

## 2016-11-02 NOTE — ED Notes (Signed)
[facility]: [facility] Expected date:  Expected time:  Means of arrival:  Comments: EMS- 55yo M, no complaints/hyperglycemia

## 2016-11-02 NOTE — Discharge Instructions (Signed)
Please resume taking your medications.  [name].  Please return to the emergency department if you develop any new or symptoms.

## 2016-11-02 NOTE — ED Notes (Signed)
Date and time results received: 11/02/16 3:32 PM    Test: blood glucose Critical Value: 711  Name of Provider Notified: PA Glenford BayleyAlex Law  Orders Received? Or Actions Taken?: PA Alex made aware, insulin drip has been started, will recheck blood glucose, per PA Alex.

## 2016-11-02 NOTE — ED Triage Notes (Signed)
Patient here via EMS homeless with complaints of hyperglycemia.

## 2016-11-02 NOTE — ED Provider Notes (Signed)
[facility]-EMERGENCY DEPT Provider Note   CSN: [id number] Arrival date & time: 11/02/16  1327     History   Chief Complaint Chief Complaint  Patient presents with  . Hyperglycemia    HPI Mike [name] is a 56 y.o. male with history of diabetes, DKA, hypertension, homelessness, noncompliance who presents with hyperglycemia after being found under a tree unresponsive.  He does not remember hitting his head or losing consciousness. He remembers falling asleep under the tree.  Patient denies any complaints.  He reports he does not remember the last time he took any medications for his diabetes outside of the hospital.  His PCP is at the [facility], but he has not been in awhile. He denies any chest pain, shortness of breath, abdominal pain, nausea, vomiting, urinary symptoms.  HPI  Past Medical History:  Diagnosis Date  . Anxiety   . Anxiety   . Chronic lower back pain   . Depression   . DKA (diabetic ketoacidoses) (HCC) 07/30/2016  . Hyperlipemia   . Hypertension   . Migraine    "last one was ~ 4 yr ago" (12/23/2014)  . Seizures (HCC)    "related to pills for anxiety; if I don't take the pills I'm suppose to take I'll have them" (12/23/2014)  . Type II diabetes mellitus [facility])     Patient Active Problem List   Diagnosis Date Noted  . CKD (chronic kidney disease) 10/22/2016  . Uncontrolled type 2 diabetes mellitus with hyperosmolar nonketotic hyperglycemia (HCC) 08/24/2016  . Essential hypertension 08/24/2016  . Acute ischemic stroke (HCC)   . Acute renal failure superimposed on stage 2 chronic kidney disease (HCC) 07/31/2016  . Acute metabolic encephalopathy 07/31/2016  . DKA (diabetic ketoacidoses) (HCC) 06/24/2016  . Acute kidney injury (nontraumatic) (HCC) 06/24/2016  . Protein-calorie malnutrition, severe 12/24/2014  . Chest pain, pleuritic 12/23/2014  . Homelessness 12/23/2014  . Hypertensive urgency 12/23/2014  . DM (diabetes mellitus) (HCC)  12/23/2014  . Intermittent palpitations 12/23/2014  . Tobacco abuse 12/23/2014  . Pleuritic chest pain 12/23/2014  . Abnormal EKG 12/23/2014  . Cough   . Type II diabetes mellitus with renal manifestations [facility])     Past Surgical History:  Procedure Laterality Date  . NO PAST SURGERIES         Home Medications    Prior to Admission medications   Medication Sig Start Date End Date Taking? Authorizing Provider  aspirin 325 MG tablet Take 1 tablet (325 mg total) by mouth daily. 10/27/16 11/26/16  Noralee Stain Chahn-Yang, DO  atorvastatin (LIPITOR) 40 MG tablet Take 1 tablet (40 mg total) by mouth daily. 10/27/16   Noralee Stain Chahn-Yang, DO  blood glucose meter kit and supplies Dispense based on patient and insurance preference. Use up to four times daily as directed. (FOR ICD-9 250.00, 250.01). 10/27/16   Noralee Stain Chahn-Yang, DO  glyBURIDE (DIABETA) 5 MG tablet Take 1 tablet (5 mg total) by mouth 2 (two) times daily with a meal. 10/27/16   Noralee Stain Chahn-Yang, DO  metFORMIN (GLUCOPHAGE) 1000 MG tablet Take 1 tablet (1,000 mg total) by mouth 2 (two) times daily with a meal. 10/27/16   Noralee Stain Chahn-Yang, DO  metoprolol tartrate (LOPRESSOR) 50 MG tablet Take 1 tablet (50 mg total) by mouth 2 (two) times daily. 10/27/16   Jordan Hawks, DO    Family History Family History  Problem Relation Age of Onset  . Diabetes Mellitus II Mother   . Diabetes Mellitus II Father  Social History Social History  Substance Use Topics  . Smoking status: Current Every Day Smoker    Packs/day: 0.10    Years: 40.00    Types: Cigarettes  . Smokeless tobacco: Never Used  . Alcohol use 1.2 oz/week    2 Cans of beer per week     Allergies   Ibuprofen and Tylenol [acetaminophen]   Review of Systems Review of Systems  Constitutional: Negative for chills and fever.  HENT: Negative for facial swelling and sore throat.   Respiratory: Negative for shortness  of breath.   Cardiovascular: Negative for chest pain.  Gastrointestinal: Negative for abdominal pain, nausea and vomiting.  Genitourinary: Negative for dysuria.  Musculoskeletal: Negative for back pain.  Skin: Negative for rash and wound.  Neurological: Positive for syncope. Negative for headaches.  Psychiatric/Behavioral: The patient is not nervous/anxious.      Physical Exam Updated Vital Signs BP (!) 189/96 (BP Location: Right Arm)   Pulse 74   Temp 98.2 F (36.8 C) (Oral)   Resp 14   SpO2 98%   Physical Exam  Constitutional: He appears well-developed and well-nourished. No distress.  Smells of urine, disheveled  HENT:  Head: Normocephalic and atraumatic.  Mouth/Throat: Oropharynx is clear and moist. No oropharyngeal exudate.  Eyes: Pupils are equal, round, and reactive to light. Conjunctivae are normal. Right eye exhibits no discharge. Left eye exhibits no discharge. No scleral icterus.  Neck: Normal range of motion. Neck supple. No thyromegaly present.  Cardiovascular: Normal rate, regular rhythm, normal heart sounds and intact distal pulses.  Exam reveals no gallop and no friction rub.   No murmur heard. Pulmonary/Chest: Effort normal and breath sounds normal. No stridor. No respiratory distress. He has no wheezes. He has no rales.  Abdominal: Soft. Bowel sounds are normal. He exhibits no distension. There is no tenderness. There is no rebound and no guarding.  Musculoskeletal: He exhibits no edema.  Lymphadenopathy:    He has no cervical adenopathy.  Neurological: He is alert. Coordination normal.  CN 3-12 intact; normal sensation throughout; 5/5 strength in all 4 extremities; equal bilateral grip strength  Skin: Skin is warm and dry. No rash noted. He is not diaphoretic. No pallor.  Psychiatric: He has a normal mood and affect.  Nursing note and vitals reviewed.    ED Treatments / Results  Labs (all labs ordered are listed, but only abnormal results are  displayed) Labs Reviewed  CBG MONITORING, ED - Abnormal; Notable for the following:       Result Value   Glucose-Capillary >600 (*)    All other components within normal limits  BASIC METABOLIC PANEL  URINALYSIS, ROUTINE W REFLEX MICROSCOPIC  CBC WITH DIFFERENTIAL/PLATELET  CK  I-STAT VENOUS BLOOD GAS, ED  I-STAT TROPONIN, ED  I-STAT CHEM 8, ED  I-STAT CG4 LACTIC ACID, ED    EKG  EKG Interpretation  Date/Time:  Thursday November 02 2016 14:06:09 EDT Ventricular Rate:  83 PR Interval:    QRS Duration: 87 QT Interval:  407 QTC Calculation: 479 R Axis:   31 Text Interpretation:  Sinus rhythm Borderline prolonged QT interval No significant change since last tracing Confirmed by Alvira Monday ([id number]) on 11/02/2016 2:11:13 PM       Radiology No results found.  Procedures Procedures (including critical care time)  Medications Ordered in ED Medications  dextrose 5 %-0.45 % sodium chloride infusion (not administered)  insulin regular (NOVOLIN R,HUMULIN R) 100 Units in sodium chloride 0.9 % 100 mL (1 Units/mL)  infusion (not administered)  sodium chloride 0.9 % bolus 1,000 mL (not administered)    And  sodium chloride 0.9 % bolus 1,000 mL (not administered)    And  0.9 %  sodium chloride infusion (not administered)     Initial Impression / Assessment and Plan / ED Course  I have reviewed the triage vital signs and the nursing notes.  Pertinent labs & imaging results that were available during my care of the patient were reviewed by me and considered in my medical decision making (see chart for details).     Patient with hyperglycemia without DKA in the ED.  Patient initially had anion gap of 20, however after glucose stabilizer and fluids, anion gap returned to 10.  Glucose down to 185. Stable hemoglobin, 10.6. Venous pH 7.367, bicarb 20.1.  Patient without any pain or vomiting.  Vital stable.  Patient given dose of blood pressure medication and metformin with his food.   Patient encouraged to follow-up with his primary care provider at the [facility].  Patient given prescriptions for his medications that he is advised to fill.  Return precautions discussed.  Patient understands and agrees with plan. I discussed patient case with Dr. Billy Fischer who guided the patient's management and agrees with plan.   Final Clinical Impressions(s) / ED Diagnoses   Final diagnoses:  None    New Prescriptions New Prescriptions   No medications on file     Caryl Ada 11/02/16 2313    Gareth Morgan, MD 11/04/16 1352

## 2016-11-02 NOTE — ED Notes (Signed)
Crackers given. 

## 2016-11-02 NOTE — Care Management Note (Signed)
Case Management Note  CM consulted by Conni ElliotLaw, PA for homelessness, medication issues, and PCP issues.  Advised her that pt is well known to CM and CSW.  Pt knows he needs to go see [name], NP, [facility]CP[facility].  Spoke with pt and reminded him to go see her.  He admitted it had been a while since he last saw her but that when he goes to get his mail from there, he'll see her.  Pt states he lost his medications again and CM reminded him we couldn't help get them for him again.  CM advised him Chales AbrahamsMary Ann could help with medications too.  No further CM needs noted at this time.

## 2016-11-02 NOTE — ED Notes (Signed)
Bed: WA09 Expected date:  Expected time:  Means of arrival:  Comments: 

## 2016-11-02 NOTE — ED Notes (Signed)
Bed: WA08 Expected date:  Expected time:  Means of arrival:  Comments: 

## 2016-11-02 NOTE — ED Notes (Signed)
Water given with PA Alex's permission

## 2016-11-03 ENCOUNTER — Encounter (HOSPITAL_COMMUNITY): Payer: Self-pay | Admitting: Emergency Medicine

## 2016-11-03 ENCOUNTER — Inpatient Hospital Stay (HOSPITAL_COMMUNITY)
Admission: EM | Admit: 2016-11-03 | Discharge: 2016-11-04 | DRG: 638 | Disposition: A | Discharge status: Home / Self Care | Payer: Self-pay | Attending: Family Medicine | Admitting: Family Medicine

## 2016-11-03 DIAGNOSIS — N179 Acute kidney failure, unspecified: Secondary | ICD-10-CM | POA: Diagnosis present

## 2016-11-03 DIAGNOSIS — F17210 Nicotine dependence, cigarettes, uncomplicated: Secondary | ICD-10-CM | POA: Diagnosis present

## 2016-11-03 DIAGNOSIS — Z9114 Patient's other noncompliance with medication regimen: Secondary | ICD-10-CM

## 2016-11-03 DIAGNOSIS — I10 Essential (primary) hypertension: Secondary | ICD-10-CM | POA: Diagnosis present

## 2016-11-03 DIAGNOSIS — Z886 Allergy status to analgesic agent status: Secondary | ICD-10-CM

## 2016-11-03 DIAGNOSIS — E785 Hyperlipidemia, unspecified: Secondary | ICD-10-CM | POA: Diagnosis present

## 2016-11-03 DIAGNOSIS — Z7982 Long term (current) use of aspirin: Secondary | ICD-10-CM

## 2016-11-03 DIAGNOSIS — E860 Dehydration: Secondary | ICD-10-CM | POA: Diagnosis present

## 2016-11-03 DIAGNOSIS — E1110 Type 2 diabetes mellitus with ketoacidosis without coma: Principal | ICD-10-CM

## 2016-11-03 DIAGNOSIS — Z7984 Long term (current) use of oral hypoglycemic drugs: Secondary | ICD-10-CM

## 2016-11-03 DIAGNOSIS — Z590 Homelessness: Secondary | ICD-10-CM

## 2016-11-03 DIAGNOSIS — Z91148 Patient's other noncompliance with medication regimen for other reason: Secondary | ICD-10-CM

## 2016-11-03 DIAGNOSIS — E1151 Type 2 diabetes mellitus with diabetic peripheral angiopathy without gangrene: Secondary | ICD-10-CM | POA: Diagnosis present

## 2016-11-03 DIAGNOSIS — Z833 Family history of diabetes mellitus: Secondary | ICD-10-CM

## 2016-11-03 LAB — URINALYSIS, ROUTINE W REFLEX MICROSCOPIC
Bacteria, UA: NONE SEEN
Bilirubin Urine: NEGATIVE
Glucose, UA: >=500 mg/dL — AB
Hgb urine dipstick: NEGATIVE
KETONES UR: 20 mg/dL — AB
Leukocytes, UA: NEGATIVE
Nitrite: NEGATIVE
PROTEIN: NEGATIVE mg/dL
RBC / HPF: NONE SEEN RBC/hpf (ref 0–5)
Specific Gravity, Urine: 1.026 (ref 1.005–1.030)
pH: 5 (ref 5.0–8.0)

## 2016-11-03 LAB — CBC
HCT: 30.6 % — ABNORMAL LOW (ref 39.0–52.0)
HEMOGLOBIN: 9.5 g/dL — AB (ref 13.0–17.0)
MCH: 24.7 pg — AB (ref 26.0–34.0)
MCHC: 31 g/dL (ref 30.0–36.0)
MCV: 79.5 fL (ref 78.0–100.0)
Platelets: 339 10*3/uL (ref 150–400)
RBC: 3.85 MIL/uL — AB (ref 4.22–5.81)
RDW: 13.7 % (ref 11.5–15.5)
WBC: 8.5 10*3/uL (ref 4.0–10.5)

## 2016-11-03 LAB — COMPREHENSIVE METABOLIC PANEL
ALK PHOS: 88 U/L (ref 38–126)
ALT: 24 U/L (ref 17–63)
ANION GAP: 17 — AB (ref 5–15)
AST: 14 U/L — ABNORMAL LOW (ref 15–41)
Albumin: 3.3 g/dL — ABNORMAL LOW (ref 3.5–5.0)
BUN: 18 mg/dL (ref 6–20)
CALCIUM: 8.9 mg/dL (ref 8.9–10.3)
CHLORIDE: 96 mmol/L — AB (ref 101–111)
CO2: 19 mmol/L — AB (ref 22–32)
Creatinine, Ser: 1.33 mg/dL — ABNORMAL HIGH (ref 0.61–1.24)
GFR calc non Af Amer: 59 mL/min — ABNORMAL LOW (ref >60)
Glucose, Bld: 639 mg/dL (ref 65–99)
Potassium: 4.5 mmol/L (ref 3.5–5.1)
SODIUM: 132 mmol/L — AB (ref 135–145)
Total Bilirubin: 1 mg/dL (ref 0.3–1.2)
Total Protein: 6.3 g/dL — ABNORMAL LOW (ref 6.5–8.1)

## 2016-11-03 LAB — CBG MONITORING, ED
GLUCOSE-CAPILLARY: 364 mg/dL — AB (ref 65–99)
Glucose-Capillary: 486 mg/dL — ABNORMAL HIGH (ref 65–99)

## 2016-11-03 MED ORDER — SODIUM CHLORIDE 0.9 % IV SOLN
INTRAVENOUS | Status: DC
  Administered 2016-11-03: 5.4 [IU]/h via INTRAVENOUS
  Filled 2016-11-03: qty 1

## 2016-11-03 MED ORDER — SODIUM CHLORIDE 0.9 % IV BOLUS (SEPSIS)
1000.0000 mL | Freq: Once | INTRAVENOUS | Status: AC
  Administered 2016-11-03: 1000 mL via INTRAVENOUS

## 2016-11-03 MED ORDER — DEXTROSE-NACL 5-0.45 % IV SOLN: INTRAVENOUS | Status: DC

## 2016-11-03 MED ORDER — SODIUM CHLORIDE 0.9 % IV SOLN
1000.0000 mL | INTRAVENOUS | Status: DC
  Administered 2016-11-04: 1000 mL via INTRAVENOUS

## 2016-11-03 MED ORDER — SODIUM CHLORIDE 0.9 % IV BOLUS (SEPSIS)
1000.0000 mL | Freq: Once | INTRAVENOUS | Status: AC
  Administered 2016-11-04: 1000 mL via INTRAVENOUS

## 2016-11-03 NOTE — ED Provider Notes (Signed)
[place] EMERGENCY DEPARTMENT Provider Note   CSN: [id number] Arrival date & time: 11/03/16  2006     History   Chief Complaint Chief Complaint  Patient presents with  . Hyperglycemia    HPI Mike [name] is a 56 y.o. male.  Patient with a history of DM, HTN, homelessness, medication noncompliance presents with elevated blood sugar. He denies nausea, vomiting, cough, SOB, or pain of any kind. He has recent admissions for DKA.   The history is provided by the patient. No language interpreter was used.    Past Medical History:  Diagnosis Date  . Anxiety   . Anxiety   . Chronic lower back pain   . Depression   . DKA (diabetic ketoacidoses) ([place]) 07/30/2016  . Hyperlipemia   . Hypertension   . Migraine    "last one was ~ 4 yr ago" (12/23/2014)  . Seizures ([place])    "related to pills for anxiety; if I don't take the pills I'm suppose to take I'll have them" (12/23/2014)  . Type II diabetes mellitus [facility])     Patient Active Problem List   Diagnosis Date Noted  . CKD (chronic kidney disease) 10/22/2016  . Uncontrolled type 2 diabetes mellitus with hyperosmolar nonketotic hyperglycemia ([place]) 08/24/2016  . Essential hypertension 08/24/2016  . Acute ischemic stroke ([place])   . Acute renal failure superimposed on stage 2 chronic kidney disease ([place]) 07/31/2016  . Acute metabolic encephalopathy 65/03/5463  . DKA (diabetic ketoacidoses) ([place]) 06/24/2016  . Acute kidney injury (nontraumatic) ([place]) 06/24/2016  . Protein-calorie malnutrition, severe 12/24/2014  . Chest pain, pleuritic 12/23/2014  . Homelessness 12/23/2014  . Hypertensive urgency 12/23/2014  . DM (diabetes mellitus) ([place]) 12/23/2014  . Intermittent palpitations 12/23/2014  . Tobacco abuse 12/23/2014  . Pleuritic chest pain 12/23/2014  . Abnormal EKG 12/23/2014  . Cough   . Type II diabetes mellitus with renal manifestations [facility])     Past Surgical History:  Procedure Laterality Date   . NO PAST SURGERIES         Home Medications    Prior to Admission medications   Medication Sig Start Date End Date Taking? Authorizing Provider  aspirin 325 MG tablet Take 1 tablet (325 mg total) by mouth daily. 11/02/16 12/02/16  Frederica Kuster, PA-C  atorvastatin (LIPITOR) 40 MG tablet Take 1 tablet (40 mg total) by mouth daily. 11/02/16   Law, Bea Graff, PA-C  blood glucose meter kit and supplies Dispense based on patient and insurance preference. Use up to four times daily as directed. (FOR ICD-9 250.00, 250.01). 10/27/16   Dessa Phi Chahn-Yang, DO  glyBURIDE (DIABETA) 5 MG tablet Take 1 tablet (5 mg total) by mouth 2 (two) times daily with a meal. 11/02/16   Law, Bea Graff, PA-C  hydrALAZINE (APRESOLINE) 50 MG tablet Take 50 mg by mouth 3 (three) times daily. 07/28/16   [provider]  insulin NPH-regular Human (NOVOLIN 70/30) (70-30) 100 UNIT/ML injection Inject 20 Units into the skin daily.    [provider]  metFORMIN (GLUCOPHAGE) 1000 MG tablet Take 1 tablet (1,000 mg total) by mouth 2 (two) times daily with a meal. 11/02/16   Law, Bea Graff, PA-C  metoprolol tartrate (LOPRESSOR) 50 MG tablet Take 1 tablet (50 mg total) by mouth 2 (two) times daily. 11/02/16   Frederica Kuster, PA-C    Family History Family History  Problem Relation Age of Onset  . Diabetes Mellitus II Mother   . Diabetes Mellitus II  Father     Social History Social History  Substance Use Topics  . Smoking status: Current Every Day Smoker    Packs/day: 0.10    Years: 40.00    Types: Cigarettes  . Smokeless tobacco: Never Used  . Alcohol use 1.2 oz/week    2 Cans of beer per week     Allergies   Ibuprofen and Tylenol [acetaminophen]   Review of Systems Review of Systems  Constitutional: Negative for chills and fever.  HENT: Negative.   Respiratory: Negative.   Cardiovascular: Negative.   Gastrointestinal: Negative.   Endocrine: Positive for polydipsia and  polyuria.  Musculoskeletal: Negative.   Skin: Negative.   Neurological: Negative.      Physical Exam Updated Vital Signs BP (!) 177/95   Pulse 63   Temp 98.5 F (36.9 C) (Oral)   Resp 16   SpO2 100%   Physical Exam  Constitutional: He is oriented to person, place, and time. He appears well-developed and well-nourished.  HENT:  Head: Normocephalic.  Neck: Normal range of motion. Neck supple.  Cardiovascular: Normal rate and regular rhythm.   No murmur heard. Pulmonary/Chest: Effort normal and breath sounds normal. He has no wheezes. He has no rales.  Abdominal: Soft. Bowel sounds are normal. There is no tenderness. There is no rebound and no guarding.  Musculoskeletal: Normal range of motion.  Neurological: He is alert and oriented to person, place, and time.  Skin: Skin is warm and dry.  Psychiatric: His speech is normal and behavior is normal. His affect is blunt.     ED Treatments / Results  Labs (all labs ordered are listed, but only abnormal results are displayed) Labs Reviewed  CBC - Abnormal; Notable for the following:       Result Value   RBC 3.85 (*)    Hemoglobin 9.5 (*)    HCT 30.6 (*)    MCH 24.7 (*)    All other components within normal limits  URINALYSIS, ROUTINE W REFLEX MICROSCOPIC - Abnormal; Notable for the following:    Color, Urine COLORLESS (*)    Glucose, UA >=500 (*)    Ketones, ur 20 (*)    Squamous Epithelial / LPF 0-5 (*)    All other components within normal limits  CBG MONITORING, ED - Abnormal; Notable for the following:    Glucose-Capillary >600 (*)    All other components within normal limits  COMPREHENSIVE METABOLIC PANEL   Results for orders placed or performed during the hospital encounter of 11/03/16  CBC  Result Value Ref Range   WBC 8.5 4.0 - 10.5 K/uL   RBC 3.85 (L) 4.22 - 5.81 MIL/uL   Hemoglobin 9.5 (L) 13.0 - 17.0 g/dL   HCT 30.6 (L) 39.0 - 52.0 %   MCV 79.5 78.0 - 100.0 fL   MCH 24.7 (L) 26.0 - 34.0 pg   MCHC 31.0  30.0 - 36.0 g/dL   RDW 13.7 11.5 - 15.5 %   Platelets 339 150 - 400 K/uL  Urinalysis, Routine w reflex microscopic  Result Value Ref Range   Color, Urine COLORLESS (A) YELLOW   APPearance CLEAR CLEAR   Specific Gravity, Urine 1.026 1.005 - 1.030   pH 5.0 5.0 - 8.0   Glucose, UA >=500 (A) NEGATIVE mg/dL   Hgb urine dipstick NEGATIVE NEGATIVE   Bilirubin Urine NEGATIVE NEGATIVE   Ketones, ur 20 (A) NEGATIVE mg/dL   Protein, ur NEGATIVE NEGATIVE mg/dL   Nitrite NEGATIVE NEGATIVE   Leukocytes, UA  NEGATIVE NEGATIVE   RBC / HPF NONE SEEN 0 - 5 RBC/hpf   WBC, UA 0-5 0 - 5 WBC/hpf   Bacteria, UA NONE SEEN NONE SEEN   Squamous Epithelial / LPF 0-5 (A) NONE SEEN   Mucus PRESENT   Comprehensive metabolic panel  Result Value Ref Range   Sodium 132 (L) 135 - 145 mmol/L   Potassium 4.5 3.5 - 5.1 mmol/L   Chloride 96 (L) 101 - 111 mmol/L   CO2 19 (L) 22 - 32 mmol/L   Glucose, Bld PENDING 65 - 99 mg/dL   BUN 18 6 - 20 mg/dL   Creatinine, Ser 1.33 (H) 0.61 - 1.24 mg/dL   Calcium 8.9 8.9 - 10.3 mg/dL   Total Protein 6.3 (L) 6.5 - 8.1 g/dL   Albumin 3.3 (L) 3.5 - 5.0 g/dL   AST 14 (L) 15 - 41 U/L   ALT 24 17 - 63 U/L   Alkaline Phosphatase 88 38 - 126 U/L   Total Bilirubin 1.0 0.3 - 1.2 mg/dL   GFR calc non Af Amer 59 (L) >60 mL/min   GFR calc Af Amer >60 >60 mL/min   Anion gap 17 (H) 5 - 15  CBG monitoring, ED  Result Value Ref Range   Glucose-Capillary >600 (HH) 65 - 99 mg/dL   Comment 1 Notify RN     EKG  EKG Interpretation None       Radiology No results found.  Procedures Procedures (including critical care time)  Medications Ordered in ED Medications  sodium chloride 0.9 % bolus 1,000 mL (not administered)    Followed by  sodium chloride 0.9 % bolus 1,000 mL (1,000 mLs Intravenous New Bag/Given 11/03/16 2124)    Followed by  sodium chloride 0.9 % bolus 1,000 mL (1,000 mLs Intravenous New Bag/Given 11/03/16 2125)    Followed by  0.9 %  sodium chloride infusion  (not administered)  dextrose 5 %-0.45 % sodium chloride infusion (not administered)  insulin regular (NOVOLIN R,HUMULIN R) 100 Units in sodium chloride 0.9 % 100 mL (1 Units/mL) infusion (5.4 Units/hr Intravenous New Bag/Given 11/03/16 2143)     Initial Impression / Assessment and Plan / ED Course  I have reviewed the triage vital signs and the nursing notes.  Pertinent labs & imaging results that were available during my care of the patient were reviewed by me and considered in my medical decision making (see chart for details).     Patient returns to ED by EMS who found patient's CBG to read "high". EMS apparently called by the patient because he felt his blood sugar was elevated.   No complaint of pain, nausea, vomiting or pain here. He has been calm, cooperative, quiet. Eating and drinking. Full labs pending, CBG "high", >600. Glucostabilizer started with fluid bolus.   Patient found to be in DKA again. [facility] accepts the patient for admission.  Final Clinical Impressions(s) / ED Diagnoses   Final diagnoses:  None   1. DKA w/o come 2. History of medication noncompliance  New Prescriptions New Prescriptions   No medications on file     Charlann Lange, Hershal Coria 11/08/16 0206    Merryl Hacker, MD 11/08/16 [phone]

## 2016-11-03 NOTE — ED Triage Notes (Signed)
Pt states he is homeless and unable to procure medications or take care of his health.  Was here yesterday and treated for hypertension.  Pt has history of hypertension and diabetes. CBG greater than 600 with EMS today.  Pt alert and oriented. No other complaints.

## 2016-11-04 ENCOUNTER — Encounter (HOSPITAL_COMMUNITY): Payer: Self-pay | Admitting: Emergency Medicine

## 2016-11-04 ENCOUNTER — Emergency Department (HOSPITAL_COMMUNITY)
Admission: EM | Admit: 2016-11-04 | Discharge: 2016-11-05 | Disposition: A | Discharge status: Home / Self Care | Payer: Self-pay | Attending: Emergency Medicine | Admitting: Emergency Medicine

## 2016-11-04 DIAGNOSIS — I129 Hypertensive chronic kidney disease with stage 1 through stage 4 chronic kidney disease, or unspecified chronic kidney disease: Secondary | ICD-10-CM | POA: Insufficient documentation

## 2016-11-04 DIAGNOSIS — N189 Chronic kidney disease, unspecified: Secondary | ICD-10-CM | POA: Insufficient documentation

## 2016-11-04 DIAGNOSIS — R4182 Altered mental status, unspecified: Secondary | ICD-10-CM | POA: Insufficient documentation

## 2016-11-04 DIAGNOSIS — F17210 Nicotine dependence, cigarettes, uncomplicated: Secondary | ICD-10-CM | POA: Insufficient documentation

## 2016-11-04 DIAGNOSIS — N179 Acute kidney failure, unspecified: Secondary | ICD-10-CM

## 2016-11-04 DIAGNOSIS — E1165 Type 2 diabetes mellitus with hyperglycemia: Secondary | ICD-10-CM | POA: Insufficient documentation

## 2016-11-04 DIAGNOSIS — F4329 Adjustment disorder with other symptoms: Secondary | ICD-10-CM | POA: Insufficient documentation

## 2016-11-04 DIAGNOSIS — I10 Essential (primary) hypertension: Secondary | ICD-10-CM

## 2016-11-04 DIAGNOSIS — D649 Anemia, unspecified: Secondary | ICD-10-CM | POA: Insufficient documentation

## 2016-11-04 DIAGNOSIS — E1110 Type 2 diabetes mellitus with ketoacidosis without coma: Principal | ICD-10-CM

## 2016-11-04 DIAGNOSIS — R739 Hyperglycemia, unspecified: Secondary | ICD-10-CM

## 2016-11-04 LAB — BASIC METABOLIC PANEL
Anion gap: 9 (ref 5–15)
Anion gap: 8 (ref 5–15)
Anion gap: 6 (ref 5–15)
Anion gap: 8 (ref 5–15)
BUN: 12 mg/dL (ref 6–20)
BUN: 9 mg/dL (ref 6–20)
BUN: 8 mg/dL (ref 6–20)
BUN: 9 mg/dL (ref 6–20)
CHLORIDE: 104 mmol/L (ref 101–111)
CO2: 25 mmol/L (ref 22–32)
CO2: 25 mmol/L (ref 22–32)
CO2: 26 mmol/L (ref 22–32)
CO2: 25 mmol/L (ref 22–32)
CREATININE: 0.76 mg/dL (ref 0.61–1.24)
CREATININE: 0.65 mg/dL (ref 0.61–1.24)
CREATININE: 0.72 mg/dL (ref 0.61–1.24)
Calcium: 8.5 mg/dL — ABNORMAL LOW (ref 8.9–10.3)
Calcium: 8.6 mg/dL — ABNORMAL LOW (ref 8.9–10.3)
Calcium: 8.4 mg/dL — ABNORMAL LOW (ref 8.9–10.3)
Calcium: 8.4 mg/dL — ABNORMAL LOW (ref 8.9–10.3)
Chloride: 105 mmol/L (ref 101–111)
Chloride: 103 mmol/L (ref 101–111)
Chloride: 101 mmol/L (ref 101–111)
Creatinine, Ser: 0.86 mg/dL (ref 0.61–1.24)
Glucose, Bld: 222 mg/dL — ABNORMAL HIGH (ref 65–99)
Glucose, Bld: 105 mg/dL — ABNORMAL HIGH (ref 65–99)
Glucose, Bld: 145 mg/dL — ABNORMAL HIGH (ref 65–99)
Glucose, Bld: 261 mg/dL — ABNORMAL HIGH (ref 65–99)
POTASSIUM: 3.4 mmol/L — AB (ref 3.5–5.1)
POTASSIUM: 3.2 mmol/L — AB (ref 3.5–5.1)
POTASSIUM: 3.1 mmol/L — AB (ref 3.5–5.1)
Potassium: 3.5 mmol/L (ref 3.5–5.1)
SODIUM: 138 mmol/L (ref 135–145)
SODIUM: 138 mmol/L (ref 135–145)
SODIUM: 135 mmol/L (ref 135–145)
SODIUM: 134 mmol/L — AB (ref 135–145)

## 2016-11-04 LAB — RAPID URINE DRUG SCREEN, HOSP PERFORMED
Amphetamines: NOT DETECTED
BENZODIAZEPINES: NOT DETECTED
Barbiturates: NOT DETECTED
COCAINE: NOT DETECTED
OPIATES: NOT DETECTED
Tetrahydrocannabinol: NOT DETECTED

## 2016-11-04 LAB — CBC
HCT: 28 % — ABNORMAL LOW (ref 39.0–52.0)
Hemoglobin: 9 g/dL — ABNORMAL LOW (ref 13.0–17.0)
MCH: 25.5 pg — ABNORMAL LOW (ref 26.0–34.0)
MCHC: 32.1 g/dL (ref 30.0–36.0)
MCV: 79.3 fL (ref 78.0–100.0)
PLATELETS: 328 10*3/uL (ref 150–400)
RBC: 3.53 MIL/uL — ABNORMAL LOW (ref 4.22–5.81)
RDW: 13.5 % (ref 11.5–15.5)
WBC: 8.5 10*3/uL (ref 4.0–10.5)

## 2016-11-04 LAB — GLUCOSE, CAPILLARY
GLUCOSE-CAPILLARY: 219 mg/dL — AB (ref 65–99)
GLUCOSE-CAPILLARY: 118 mg/dL — AB (ref 65–99)
GLUCOSE-CAPILLARY: 197 mg/dL — AB (ref 65–99)
Glucose-Capillary: 187 mg/dL — ABNORMAL HIGH (ref 65–99)
Glucose-Capillary: 206 mg/dL — ABNORMAL HIGH (ref 65–99)
Glucose-Capillary: 228 mg/dL — ABNORMAL HIGH (ref 65–99)
Glucose-Capillary: 185 mg/dL — ABNORMAL HIGH (ref 65–99)
Glucose-Capillary: 125 mg/dL — ABNORMAL HIGH (ref 65–99)
Glucose-Capillary: 116 mg/dL — ABNORMAL HIGH (ref 65–99)
Glucose-Capillary: 154 mg/dL — ABNORMAL HIGH (ref 65–99)
Glucose-Capillary: 235 mg/dL — ABNORMAL HIGH (ref 65–99)
Glucose-Capillary: 261 mg/dL — ABNORMAL HIGH (ref 65–99)

## 2016-11-04 LAB — MRSA PCR SCREENING: MRSA BY PCR: NEGATIVE

## 2016-11-04 LAB — CBG MONITORING, ED
GLUCOSE-CAPILLARY: 270 mg/dL — AB (ref 65–99)
GLUCOSE-CAPILLARY: 386 mg/dL — AB (ref 65–99)

## 2016-11-04 LAB — MAGNESIUM: MAGNESIUM: 1.6 mg/dL — AB (ref 1.7–2.4)

## 2016-11-04 MED ORDER — HYDRALAZINE HCL 20 MG/ML IJ SOLN
10.0000 mg | INTRAMUSCULAR | Status: DC | PRN
  Administered 2016-11-04: 10 mg via INTRAVENOUS
  Filled 2016-11-04: qty 1

## 2016-11-04 MED ORDER — POTASSIUM CHLORIDE CRYS ER 20 MEQ PO TBCR
40.0000 meq | EXTENDED_RELEASE_TABLET | ORAL | Status: AC
  Administered 2016-11-04 (×2): 40 meq via ORAL
  Filled 2016-11-04 (×2): qty 2

## 2016-11-04 MED ORDER — INSULIN ASPART 100 UNIT/ML ~~LOC~~ SOLN
0.0000 [IU] | Freq: Three times a day (TID) | SUBCUTANEOUS | Status: DC
  Administered 2016-11-04: 8 [IU] via SUBCUTANEOUS
  Administered 2016-11-04: 3 [IU] via SUBCUTANEOUS

## 2016-11-04 MED ORDER — METFORMIN HCL 1000 MG PO TABS: 1000.0000 mg | ORAL_TABLET | Freq: Two times a day (BID) | ORAL | 0 refills | Status: DC

## 2016-11-04 MED ORDER — DEXTROSE-NACL 5-0.45 % IV SOLN
INTRAVENOUS | Status: DC
  Administered 2016-11-04 (×2): via INTRAVENOUS

## 2016-11-04 MED ORDER — POTASSIUM CHLORIDE 10 MEQ/100ML IV SOLN
10.0000 meq | INTRAVENOUS | Status: AC
  Administered 2016-11-04 (×2): 10 meq via INTRAVENOUS
  Filled 2016-11-04 (×2): qty 100

## 2016-11-04 MED ORDER — ASPIRIN 81 MG PO TABS: 81.0000 mg | ORAL_TABLET | Freq: Every day | ORAL | 0 refills | Status: DC

## 2016-11-04 MED ORDER — METOPROLOL TARTRATE 50 MG PO TABS: 50.0000 mg | ORAL_TABLET | Freq: Two times a day (BID) | ORAL | 0 refills | Status: DC

## 2016-11-04 MED ORDER — GLYBURIDE 5 MG PO TABS: 10.0000 mg | ORAL_TABLET | Freq: Two times a day (BID) | ORAL | 0 refills | Status: DC

## 2016-11-04 MED ORDER — INSULIN ASPART 100 UNIT/ML ~~LOC~~ SOLN: 0.0000 [IU] | Freq: Every day | SUBCUTANEOUS | Status: DC

## 2016-11-04 MED ORDER — ENOXAPARIN SODIUM 40 MG/0.4ML ~~LOC~~ SOLN
40.0000 mg | SUBCUTANEOUS | Status: DC
  Administered 2016-11-04: 40 mg via SUBCUTANEOUS
  Filled 2016-11-04: qty 0.4

## 2016-11-04 MED ORDER — INSULIN GLARGINE 100 UNIT/ML ~~LOC~~ SOLN
10.0000 [IU] | Freq: Every day | SUBCUTANEOUS | Status: DC
  Administered 2016-11-04: 10 [IU] via SUBCUTANEOUS
  Filled 2016-11-04: qty 0.1

## 2016-11-04 MED ORDER — SODIUM CHLORIDE 0.9 % IV SOLN: INTRAVENOUS | Status: DC

## 2016-11-04 MED ORDER — SODIUM CHLORIDE 0.9 % IV SOLN
INTRAVENOUS | Status: DC
  Administered 2016-11-04: 1.3 [IU]/h via INTRAVENOUS
  Filled 2016-11-04: qty 1

## 2016-11-04 MED ORDER — ATORVASTATIN CALCIUM 40 MG PO TABS: 40.0000 mg | ORAL_TABLET | Freq: Every day | ORAL | 0 refills | Status: DC

## 2016-11-04 NOTE — ED Triage Notes (Signed)
Pt presents by EMS for evaluation of high blood sugar and [facility] and was unable to get medication due to resources.

## 2016-11-04 NOTE — Discharge Summary (Signed)
Physician Discharge Summary  Mike [name]  BJY:[id number]  DOB: [birth year]  DOA: 11/03/2016 PCP: Mike Coots, NP  Admit date: 11/03/2016 Discharge date: 11/04/2016  Admitted From: Home  Disposition: Shelter  Recommendations for Outpatient Follow-up:  1. Follow up with PCP in 1 week  2. Please obtain BMP/CBC in one week to monitor renal function   Discharge Condition: Stable   CODE STATUS: Full  Diet recommendation: Heart Healthy / Carb Modified   Brief/Interim Summary: For full details see H&P but in brief, Mike [name] is a 56 year old male with medical history significant for HTN and diabetes mellitus type 2, with frequent admissions of DKA.  Presented to the emergency department complaining of feeling ill.  In the ED was found to have blood glucose of > 600 with elevated anion gap patient was admitted for diabetic acidosis.  Patient was started on insulin drip subsequently weaning of to subcutaneous insulin.  Anion gap has closed and patient tolerating diet well.  Blood glucose remained under control.  Patient clinically stable and deemed safe for discharge.  Subjective: Patient seen and examined report feeling well, hungry asking for food.  Patient denies abdominal pain, nausea, vomiting and confusion. Patient reported being compliant with meds and denies any source of infection.  No URI, urine symptoms or diarrhea.  Discharge Diagnoses/Hospital Course:  Diabetic ketoacidosis and type 2 diabetes mellitus (uncontrolled with hyperglycemia) Treated with  insulin drip subsequently transitioned to subcutaneous insulin.  Patient on glipizide and metformin, he probably will benefit from insulin although patient reports he does not have any insurance and unable to afford insulin.  Will adjust oral medication to maximize efficacy. Will increase glyburide and continue metformin at current dose. Patient is homeless, Education officer, museum was consulted and list of shelter were provided.  Patient report been complaint with medication, but per pharmacy notes patient has not filled his prescriptions.   AKI  Due to dehydration from hyperglycemia  Resolved with IVF   HTN  BP stable during hospital stay, continue home medications with no changes  Follow up with PCP   All other chronic medical condition were stable during the hospitalization.  On the day of the discharge the patient's vitals were stable, and no other acute medical condition were reported by patient. Patient was felt safe to be discharge to home   Discharge Instructions  You were cared for by a hospitalist during your hospital stay. If you have any questions about your discharge medications or the care you received while you were in the hospital after you are discharged, you can call the unit and asked to speak with the hospitalist on call if the hospitalist that took care of you is not available. Once you are discharged, your primary care physician will handle any further medical issues. Please note that NO REFILLS for any discharge medications will be authorized once you are discharged, as it is imperative that you return to your primary care physician (or establish a relationship with a primary care physician if you do not have one) for your aftercare needs so that they can reassess your need for medications and monitor your lab values.  Discharge Instructions    Call MD for:  difficulty breathing, headache or visual disturbances    Complete by:  As directed    Call MD for:  extreme fatigue    Complete by:  As directed    Call MD for:  hives    Complete by:  As directed    Call MD for:  persistant dizziness or light-headedness    Complete by:  As directed    Call MD for:  persistant nausea and vomiting    Complete by:  As directed    Call MD for:  redness, tenderness, or signs of infection (pain, swelling, redness, odor or green/yellow discharge around incision site)    Complete by:  As directed    Call MD  for:  severe uncontrolled pain    Complete by:  As directed    Call MD for:  temperature >100.4    Complete by:  As directed    Diet - low sodium heart healthy    Complete by:  As directed    Increase activity slowly    Complete by:  As directed      Allergies as of 11/04/2016      Reactions   Ibuprofen Nausea And Vomiting, Other (See Comments)   Reaction:  Bloating    Tylenol [acetaminophen] Nausea And Vomiting, Other (See Comments)   Reaction:  Bloating       Medication List    TAKE these medications   aspirin 81 MG tablet Take 1 tablet (81 mg total) by mouth daily. What changed:  medication strength  how much to take   atorvastatin 40 MG tablet Commonly known as:  LIPITOR Take 1 tablet (40 mg total) by mouth daily.   blood glucose meter kit and supplies Dispense based on patient and insurance preference. Use up to four times daily as directed. (FOR ICD-9 250.00, 250.01).   glyBURIDE 5 MG tablet Commonly known as:  DIABETA Take 2 tablets (10 mg total) by mouth 2 (two) times daily with a meal. What changed:  how much to take   metFORMIN 1000 MG tablet Commonly known as:  GLUCOPHAGE Take 1 tablet (1,000 mg total) by mouth 2 (two) times daily with a meal.   metoprolol tartrate 50 MG tablet Commonly known as:  LOPRESSOR Take 1 tablet (50 mg total) by mouth 2 (two) times daily.      Follow-up Information    [name], Audrea Muscat, NP. Schedule an appointment as soon as possible for a visit in 1 week(s).   Why:  Hospital follow up  Contact information: [address] [place] [address] [phone]          Allergies  Allergen Reactions  . Ibuprofen Nausea And Vomiting and Other (See Comments)    Reaction:  Bloating   . Tylenol [Acetaminophen] Nausea And Vomiting and Other (See Comments)    Reaction:  Bloating     Consultations:  None    Procedures/Studies: Dg Chest 2 View  Result Date: 10/22/2016 CLINICAL DATA:  56 year old male with  decreased level consciousness. EXAM: CHEST  2 VIEW COMPARISON:  Chest x-ray 07/28/2016. FINDINGS: Lung volumes are normal. No consolidative airspace disease. No pleural effusions. No pneumothorax. No pulmonary nodule or mass noted. Pulmonary vasculature and the cardiomediastinal silhouette are within normal limits. IMPRESSION: No radiographic evidence of acute cardiopulmonary disease. Electronically Signed   By: Vinnie Langton M.D.   On: 10/22/2016 15:14   Ct Head Wo Contrast  Result Date: 10/22/2016 CLINICAL DATA:  Patient's presentation today consistent with hyperglycemia and DKA EXAM: CT HEAD WITHOUT CONTRAST TECHNIQUE: Contiguous axial images were obtained from the base of the skull through the vertex without intravenous contrast. COMPARISON:  None. FINDINGS: Brain: No evidence of acute infarction, hemorrhage, hydrocephalus, extra-axial collection or mass lesion/mass effect. Generalized cerebral atrophy. Periventricular white matter low attenuation likely secondary to microangiopathy.  Vascular: No hyperdense vessel or unexpected calcification. Skull: No osseous abnormality. Sinuses/Orbits: Visualized paranasal sinuses are clear. Visualized mastoid sinuses are clear. Visualized orbits demonstrate no focal abnormality. Other: None IMPRESSION: 1. No acute intracranial pathology. 2. Chronic microvascular disease and cerebral atrophy. Electronically Signed   By: Kathreen Devoid   On: 10/22/2016 14:50     Discharge Exam: Vitals:   11/04/16 0755 11/04/16 1252  BP: (!) 153/71 132/74  Pulse: 66 91  Resp: 18 14  Temp: 98.1 F (36.7 C) 98.5 F (36.9 C)  SpO2: 99% 96%   Vitals:   11/04/16 0213 11/04/16 0433 11/04/16 0755 11/04/16 1252  BP: (!) 181/84 (!) 160/87 (!) 153/71 132/74  Pulse:   66 91  Resp:   18 14  Temp:   98.1 F (36.7 C) 98.5 F (36.9 C)  TempSrc:   Oral Oral  SpO2:  99% 99% 96%  Weight:      Height:        General: Pt is alert, awake, not in acute distress, disheveled   Cardiovascular: RRR, S1/S2 +, no rubs, no gallops Respiratory: CTA bilaterally, no wheezing, no rhonchi Abdominal: Soft, NT, ND, bowel sounds + Extremities: no edema   The results of significant diagnostics from this hospitalization (including imaging, microbiology, ancillary and laboratory) are listed below for reference.     Microbiology: Recent Results (from the past 240 hour(s))  MRSA PCR Screening     Status: None   Collection Time: 11/04/16  1:36 AM  Result Value Ref Range Status   MRSA by PCR NEGATIVE NEGATIVE Final    Comment:        The GeneXpert MRSA Assay (FDA approved for NASAL specimens only), is one component of a comprehensive MRSA colonization surveillance program. It is not intended to diagnose MRSA infection nor to guide or monitor treatment for MRSA infections.      Labs: BNP (last 3 results) No results for input(s): BNP in the last 8760 hours. Basic Metabolic Panel:  Recent Labs Lab 11/03/16 2133 11/04/16 0210 11/04/16 0722 11/04/16 0952 11/04/16 1214  NA 132* 138 138 135 134*  K 4.5 3.4* 3.2* 3.1* 3.5  CL 96* 104 105 103 101  CO2 19* _0 GLUCOSE 639* 222* 105* 145* 261*  BUN _1 CREATININE 1.33* 0.86 0.76 0.65 0.72  CALCIUM 8.9 8.5* 8.6* 8.4* 8.4*  MG  --   --   --   --  1.6*   Liver Function Tests:  Recent Labs Lab 11/03/16 2133  AST 14*  ALT 24  ALKPHOS 88  BILITOT 1.0  PROT 6.3*  ALBUMIN 3.3*   No results for input(s): LIPASE, AMYLASE in the last 168 hours. No results for input(s): AMMONIA in the last 168 hours. CBC:  Recent Labs Lab 11/02/16 1407 11/02/16 1422 11/03/16 2133 11/04/16 0210  WBC 7.5  --  8.5 8.5  NEUTROABS 5.4  --   --   --   HGB 10.6* 12.6* 9.5* 9.0*  HCT 34.2* 37.0* 30.6* 28.0*  MCV 80.7  --  79.5 79.3  PLT 434*  --  339 328   Cardiac Enzymes:  Recent Labs Lab 11/02/16 1407  CKTOTAL 83   BNP: Invalid input(s): POCBNP CBG:  Recent Labs Lab 11/04/16 0813  11/04/16 0917 11/04/16 1031 11/04/16 1137 11/04/16 1239  GLUCAP 116* 118* 154* 235* 261*   D-Dimer No results for input(s): DDIMER in the last 72 hours. Hgb A1c No results for  input(s): HGBA1C in the last 72 hours. Lipid Profile No results for input(s): CHOL, HDL, LDLCALC, TRIG, CHOLHDL, LDLDIRECT in the last 72 hours. Thyroid function studies No results for input(s): TSH, T4TOTAL, T3FREE, THYROIDAB in the last 72 hours.  Invalid input(s): FREET3 Anemia work up No results for input(s): VITAMINB12, FOLATE, FERRITIN, TIBC, IRON, RETICCTPCT in the last 72 hours. Urinalysis    Component Value Date/Time   COLORURINE COLORLESS (A) 11/03/2016 2125   APPEARANCEUR CLEAR 11/03/2016 2125   LABSPEC 1.026 11/03/2016 2125   PHURINE 5.0 11/03/2016 2125   GLUCOSEU >=500 (A) 11/03/2016 2125   HGBUR NEGATIVE 11/03/2016 2125   [place] NEGATIVE 11/03/2016 2125   KETONESUR 20 (A) 11/03/2016 2125   PROTEINUR NEGATIVE 11/03/2016 2125   UROBILINOGEN 1.0 02/07/2011 1605   NITRITE NEGATIVE 11/03/2016 2125   LEUKOCYTESUR NEGATIVE 11/03/2016 2125   Sepsis Labs Invalid input(s): PROCALCITONIN,  WBC,  LACTICIDVEN Microbiology Recent Results (from the past 240 hour(s))  MRSA PCR Screening     Status: None   Collection Time: 11/04/16  1:36 AM  Result Value Ref Range Status   MRSA by PCR NEGATIVE NEGATIVE Final    Comment:        The GeneXpert MRSA Assay (FDA approved for NASAL specimens only), is one component of a comprehensive MRSA colonization surveillance program. It is not intended to diagnose MRSA infection nor to guide or monitor treatment for MRSA infections.     Time coordinating discharge: 35 minutes  SIGNED:  Chipper Oman, MD  [facility] 11/04/2016, 2:42 PM  Pager please text page via  [email or web address] Password [email or web address]

## 2016-11-04 NOTE — Progress Notes (Signed)
Patient taken off the unit to the emergency room entrance on a wheelchair, 2 bus pass given, patient appreciate care provided. Patient educated on the need to get his medication and use them as prescribed, he verbalized understanding.

## 2016-11-04 NOTE — H&P (Signed)
History and Physical    Deandre Mutchler KZL:[id number] DOB: [birth year] DOA: 11/03/2016  PCP: [name], NP Patient coming from: homeless - the stret   Chief Complaint: feeling ill  HPI: Mike [name] is a 56 y.o. male with medical history significant for homelessness, uncontrolled DM, frequent admissions including recent admit for dka, presenting confused, saying he is ill. Denies chest pain, difficulty breathing, cough, fever, abdominal pain, back pain, nausea, vomiting, or diarrhea. Says compliant with meds. Denies drug use.   ED Course: fluids, glucostabilizer, labs  Review of Systems: As per HPI otherwise 10 point review of systems negative.    Past Medical History:  Diagnosis Date  . Anxiety   . Anxiety   . Chronic lower back pain   . Depression   . DKA (diabetic ketoacidoses) ([place]) 07/30/2016  . Hyperlipemia   . Hypertension   . Migraine    "last one was ~ 4 yr ago" (12/23/2014)  . Seizures ([place])    "related to pills for anxiety; if I don't take the pills I'm suppose to take I'll have them" (12/23/2014)  . Type II diabetes mellitus ([place])     Past Surgical History:  Procedure Laterality Date  . NO PAST SURGERIES       reports that he has been smoking Cigarettes.  He has a 4.00 pack-year smoking history. He has never used smokeless tobacco. He reports that he drinks about 1.2 oz of alcohol per week . He reports that he does not use drugs.  Allergies  Allergen Reactions  . Ibuprofen Nausea And Vomiting and Other (See Comments)    Reaction:  Bloating   . Tylenol [Acetaminophen] Nausea And Vomiting and Other (See Comments)    Reaction:  Bloating     Family History  Problem Relation Age of Onset  . Diabetes Mellitus II Mother   . Diabetes Mellitus II Father      Prior to Admission medications   Medication Sig Start Date End Date Taking? Authorizing Provider  aspirin 325 MG tablet Take 1 tablet (325 mg total) by mouth daily. 11/02/16 12/02/16  Frederica Kuster, PA-C  atorvastatin (LIPITOR) 40 MG tablet Take 1 tablet (40 mg total) by mouth daily. 11/02/16   Law, Bea Graff, PA-C  blood glucose meter kit and supplies Dispense based on patient and insurance preference. Use up to four times daily as directed. (FOR ICD-9 250.00, 250.01). 10/27/16   Dessa Phi Chahn-Yang, DO  glyBURIDE (DIABETA) 5 MG tablet Take 1 tablet (5 mg total) by mouth 2 (two) times daily with a meal. 11/02/16   Law, Bea Graff, PA-C  metFORMIN (GLUCOPHAGE) 1000 MG tablet Take 1 tablet (1,000 mg total) by mouth 2 (two) times daily with a meal. 11/02/16   Law, Bea Graff, PA-C  metoprolol tartrate (LOPRESSOR) 50 MG tablet Take 1 tablet (50 mg total) by mouth 2 (two) times daily. 11/02/16   Frederica Kuster, PA-C    Physical Exam: Vitals:   11/03/16 2012 11/03/16 2030 11/03/16 2100  BP: (!) 192/90 (!) 185/87 (!) 177/95  Pulse: 60 61 63  Resp: 16    Temp: 98.5 F (36.9 C)    TempSrc: Oral    SpO2: 100% 100% 100%    Constitutional: NAD, calm, comfortable Vitals:   11/03/16 2012 11/03/16 2030 11/03/16 2100  BP: (!) 192/90 (!) 185/87 (!) 177/95  Pulse: 60 61 63  Resp: 16    Temp: 98.5 F (36.9 C)    TempSrc: Oral  SpO2: 100% 100% 100%   Eyes: no conjunctival injection ENMT: Mucous membranes are dry Neck: normal, supple,  Respiratory: clear to auscultation bilaterally, no wheezing, no crackles. Normal respiratory effort. No accessory muscle use.  Cardiovascular: Regular rate and rhythm, no murmurs / rubs / gallops. No extremity edema. 2+ pedal pulses. No carotid bruits.  Abdomen: no tenderness, no masses palpated. No hepatosplenomegaly. Bowel sounds positive.  Musculoskeletal: no clubbing / cyanosis. No joint deformity upper and lower extremities.  Skin: no rashes, lesions, ulcers. No induration. Poor hygeine Neurologic: moving all 4 extremities  Psychiatric: confused, simple answers   Labs on Admission: I have personally reviewed following labs  and imaging studies  CBC:  Recent Labs Lab 11/02/16 1407 11/02/16 1422 11/03/16 2133  WBC 7.5  --  8.5  NEUTROABS 5.4  --   --   HGB 10.6* 12.6* 9.5*  HCT 34.2* 37.0* 30.6*  MCV 80.7  --  79.5  PLT 434*  --  976   Basic Metabolic Panel:  Recent Labs Lab 11/02/16 1407 11/02/16 1422 11/02/16 1835 11/03/16 2133  NA 139 138 144 132*  K 5.0 5.1 3.6 4.5  CL 97* 99* 108 96*  CO2 22  --  26 19*  GLUCOSE 711* 668* 216* 639*  BUN 25* 25* 20 18  CREATININE 1.14 0.80 0.88 1.33*  CALCIUM 9.6  --  9.1 8.9   GFR: Estimated Creatinine Clearance: 57.9 mL/min (A) (by C-G formula based on SCr of 1.33 mg/dL (H)). Liver Function Tests:  Recent Labs Lab 11/03/16 2133  AST 14*  ALT 24  ALKPHOS 88  BILITOT 1.0  PROT 6.3*  ALBUMIN 3.3*   No results for input(s): LIPASE, AMYLASE in the last 168 hours. No results for input(s): AMMONIA in the last 168 hours. Coagulation Profile: No results for input(s): INR, PROTIME in the last 168 hours. Cardiac Enzymes:  Recent Labs Lab 11/02/16 1407  CKTOTAL 83   BNP (last 3 results) No results for input(s): PROBNP in the last 8760 hours. HbA1C: No results for input(s): HGBA1C in the last 72 hours. CBG:  Recent Labs Lab 11/02/16 1821 11/02/16 1928 11/03/16 2042 11/03/16 2249 11/03/16 2347  GLUCAP 227* 185* >600* 486* 364*   Lipid Profile: No results for input(s): CHOL, HDL, LDLCALC, TRIG, CHOLHDL, LDLDIRECT in the last 72 hours. Thyroid Function Tests: No results for input(s): TSH, T4TOTAL, FREET4, T3FREE, THYROIDAB in the last 72 hours. Anemia Panel: No results for input(s): VITAMINB12, FOLATE, FERRITIN, TIBC, IRON, RETICCTPCT in the last 72 hours. Urine analysis:    Component Value Date/Time   COLORURINE COLORLESS (A) 11/03/2016 2125   APPEARANCEUR CLEAR 11/03/2016 2125   LABSPEC 1.026 11/03/2016 2125   PHURINE 5.0 11/03/2016 2125   GLUCOSEU >=500 (A) 11/03/2016 2125   HGBUR NEGATIVE 11/03/2016 2125   [place]  NEGATIVE 11/03/2016 2125   KETONESUR 20 (A) 11/03/2016 2125   PROTEINUR NEGATIVE 11/03/2016 2125   UROBILINOGEN 1.0 02/07/2011 1605   NITRITE NEGATIVE 11/03/2016 2125   LEUKOCYTESUR NEGATIVE 11/03/2016 2125    Radiological Exams on Admission: No results found.  EKG: Independently reviewed. Normal intervals  Assessment/Plan Active Problems:   DKA (diabetic ketoacidoses) ([place])  # Diabetic ketoacidosis: initial glucose 639, bicarb 19, gap 17. S/p 3 L NS and insulin in ED. Confused - step-down - npo for now - IV fluids continue NS for now @ 250 ml/hr, transistion to d5 1/2 ns when able. K added given normal k - continue glucostabilizer, most recent glucose 300s - labs q4  for now - vbg - UDS  # Hypertension - not compliant w/ home meds. Here urgency. - hydralazine prn for severe bp elevations  # AKI - cr 1.33, most recently was normal. Likely prerenal from dehydration - fluids as above    DVT prophylaxis: lovenox  Code Status: full Family Communication: "baby mother" veronica lindsay [phone] Disposition Plan: tbd Consults called: none Admission status: sdu   Desma Maxim MD [facility] Pager [phone]  If 7PM-7AM, please contact night-coverage [email or web address] Password [email or web address]  11/04/2016, 12:21 AM

## 2016-11-04 NOTE — Progress Notes (Signed)
Received CM consult for medications. New DC medications are OTC or on [facility] $4 list.

## 2016-11-04 NOTE — Progress Notes (Signed)
Patient in a stable position, discharge education reviewed with patient he verbalized understanding, paper prescriptions given to patient, patient belongings at bedside, awaiting social worker for further discharge needs

## 2016-11-04 NOTE — Clinical Social Work Note (Signed)
Clinical Social Work Assessment  Patient Details  Name: Mike [name] MRN: [id number] Date of Birth: 02-10-1960  Date of referral:  11/04/16               Reason for consult:  Discharge Planning, Housing Concerns/Homelessness                Permission sought to share information with:    Permission granted to share information::     Name::        Agency::     Relationship::     Contact Information:     Housing/Transportation Living arrangements for the past 2 months:  Homeless Source of Information:  Patient Patient Interpreter Needed:  None Criminal Activity/Legal Involvement Pertinent to Current Situation/Hospitalization:  No - Comment as needed Significant Relationships:  None Lives with:  Other (Comment) (pt is homeles) Do you feel safe going back to the place where you live?  No Need for family participation in patient care:  Yes (Comment)  Care giving concerns:  No family at bedside. Patient is consistently homeless   Facilities manager / plan:CSW has met patient in the past. Patient is consistently homeless and has been for some time. Patient stated he has no family and support. Patient was given shelter list and was encouraged to reach out to shelter for availability. CSW gave patient some warm clothing which include a jacket. Patient was also given a book bag that contained food.  Employment status:  Unemployed Forensic scientist:  Self Pay (Medicaid Pending) PT Recommendations:    Information / Referral to community resources:  Shelter  Patient/Family's Response to care: Patient appreciates the clothing and food given to him upon discharge   Patient/Family's Understanding of and Emotional Response to Diagnosis, Current Treatment, and Prognosis:  Patient stated he has understanding of current hospitalization   Emotional Assessment Appearance:    Attitude/Demeanor/Rapport:  Other Affect (typically observed):  Appropriate Orientation:  Oriented to Place,  Oriented to Self, Oriented to  Time, Oriented to Situation Alcohol / Substance use:  Not Applicable Psych involvement (Current and /or in the community):  No (Comment)  Discharge Needs  Concerns to be addressed:  Homelessness Readmission within the last 30 days:  No Current discharge risk:  Homeless Barriers to Discharge:  Homeless with medical needs   Wende Neighbors, [name] 11/04/2016, 5:38 PM

## 2016-11-05 ENCOUNTER — Emergency Department (HOSPITAL_COMMUNITY)
Admission: EM | Admit: 2016-11-05 | Discharge: 2016-11-06 | Disposition: A | Discharge status: Home / Self Care | Payer: Self-pay | Attending: Emergency Medicine | Admitting: Emergency Medicine

## 2016-11-05 ENCOUNTER — Other Ambulatory Visit: Payer: Self-pay

## 2016-11-05 ENCOUNTER — Encounter (HOSPITAL_COMMUNITY): Payer: Self-pay

## 2016-11-05 ENCOUNTER — Emergency Department (HOSPITAL_COMMUNITY): Payer: Self-pay

## 2016-11-05 DIAGNOSIS — E1122 Type 2 diabetes mellitus with diabetic chronic kidney disease: Secondary | ICD-10-CM | POA: Insufficient documentation

## 2016-11-05 DIAGNOSIS — Z79899 Other long term (current) drug therapy: Secondary | ICD-10-CM | POA: Insufficient documentation

## 2016-11-05 DIAGNOSIS — R739 Hyperglycemia, unspecified: Secondary | ICD-10-CM

## 2016-11-05 DIAGNOSIS — Z7982 Long term (current) use of aspirin: Secondary | ICD-10-CM | POA: Insufficient documentation

## 2016-11-05 DIAGNOSIS — I129 Hypertensive chronic kidney disease with stage 1 through stage 4 chronic kidney disease, or unspecified chronic kidney disease: Secondary | ICD-10-CM | POA: Insufficient documentation

## 2016-11-05 DIAGNOSIS — Z7984 Long term (current) use of oral hypoglycemic drugs: Secondary | ICD-10-CM | POA: Insufficient documentation

## 2016-11-05 DIAGNOSIS — F17210 Nicotine dependence, cigarettes, uncomplicated: Secondary | ICD-10-CM | POA: Insufficient documentation

## 2016-11-05 DIAGNOSIS — N182 Chronic kidney disease, stage 2 (mild): Secondary | ICD-10-CM | POA: Insufficient documentation

## 2016-11-05 DIAGNOSIS — Z9114 Patient's other noncompliance with medication regimen: Secondary | ICD-10-CM | POA: Insufficient documentation

## 2016-11-05 DIAGNOSIS — E1165 Type 2 diabetes mellitus with hyperglycemia: Secondary | ICD-10-CM | POA: Insufficient documentation

## 2016-11-05 DIAGNOSIS — F4329 Adjustment disorder with other symptoms: Secondary | ICD-10-CM | POA: Diagnosis present

## 2016-11-05 LAB — COMPREHENSIVE METABOLIC PANEL
ALK PHOS: 71 U/L (ref 38–126)
ALK PHOS: 81 U/L (ref 38–126)
ALT: 21 U/L (ref 17–63)
ALT: 21 U/L (ref 17–63)
ANION GAP: 7 (ref 5–15)
AST: 16 U/L (ref 15–41)
AST: 20 U/L (ref 15–41)
Albumin: 2.9 g/dL — ABNORMAL LOW (ref 3.5–5.0)
Albumin: 3.3 g/dL — ABNORMAL LOW (ref 3.5–5.0)
Anion gap: 7 (ref 5–15)
BUN: 17 mg/dL (ref 6–20)
BUN: 18 mg/dL (ref 6–20)
CALCIUM: 8.7 mg/dL — AB (ref 8.9–10.3)
CALCIUM: 9.1 mg/dL (ref 8.9–10.3)
CHLORIDE: 101 mmol/L (ref 101–111)
CHLORIDE: 99 mmol/L — AB (ref 101–111)
CO2: 24 mmol/L (ref 22–32)
CO2: 27 mmol/L (ref 22–32)
CREATININE: 0.92 mg/dL (ref 0.61–1.24)
CREATININE: 0.96 mg/dL (ref 0.61–1.24)
GFR calc Af Amer: >60 mL/min (ref >60)
GFR calc non Af Amer: >60 mL/min (ref >60)
GLUCOSE: 533 mg/dL — AB (ref 65–99)
Glucose, Bld: 484 mg/dL — ABNORMAL HIGH (ref 65–99)
Potassium: 4.3 mmol/L (ref 3.5–5.1)
Potassium: 4.5 mmol/L (ref 3.5–5.1)
SODIUM: 132 mmol/L — AB (ref 135–145)
SODIUM: 133 mmol/L — AB (ref 135–145)
Total Bilirubin: 0.7 mg/dL (ref 0.3–1.2)
Total Bilirubin: 0.5 mg/dL (ref 0.3–1.2)
Total Protein: 5.6 g/dL — ABNORMAL LOW (ref 6.5–8.1)
Total Protein: 6.4 g/dL — ABNORMAL LOW (ref 6.5–8.1)

## 2016-11-05 LAB — URINALYSIS, ROUTINE W REFLEX MICROSCOPIC
BILIRUBIN URINE: NEGATIVE
HGB URINE DIPSTICK: NEGATIVE
Ketones, ur: NEGATIVE mg/dL
Leukocytes, UA: NEGATIVE
NITRITE: NEGATIVE
PROTEIN: NEGATIVE mg/dL
Specific Gravity, Urine: 1.014 (ref 1.005–1.030)
WBC UA: NONE SEEN WBC/hpf (ref 0–5)
pH: 6 (ref 5.0–8.0)

## 2016-11-05 LAB — CBC WITH DIFFERENTIAL/PLATELET
BASOS ABS: 0 10*3/uL (ref 0.0–0.1)
Basophils Absolute: 0 10*3/uL (ref 0.0–0.1)
Basophils Relative: 0 %
Basophils Relative: 0 %
EOS ABS: 0.1 10*3/uL (ref 0.0–0.7)
EOS ABS: 0.2 10*3/uL (ref 0.0–0.7)
EOS PCT: 1 %
Eosinophils Relative: 2 %
HCT: 29.5 % — ABNORMAL LOW (ref 39.0–52.0)
HEMATOCRIT: 27.9 % — AB (ref 39.0–52.0)
HEMOGLOBIN: 9 g/dL — AB (ref 13.0–17.0)
HEMOGLOBIN: 9.5 g/dL — AB (ref 13.0–17.0)
LYMPHS ABS: 1.6 10*3/uL (ref 0.7–4.0)
LYMPHS ABS: 2.1 10*3/uL (ref 0.7–4.0)
LYMPHS PCT: 25 %
Lymphocytes Relative: 28 %
MCH: 25.2 pg — AB (ref 26.0–34.0)
MCH: 25.5 pg — AB (ref 26.0–34.0)
MCHC: 32.2 g/dL (ref 30.0–36.0)
MCHC: 32.3 g/dL (ref 30.0–36.0)
MCV: 78.2 fL (ref 78.0–100.0)
MCV: 79.1 fL (ref 78.0–100.0)
MONOS PCT: 7 %
Monocytes Absolute: 0.5 10*3/uL (ref 0.1–1.0)
Monocytes Absolute: 0.3 10*3/uL (ref 0.1–1.0)
Monocytes Relative: 5 %
NEUTROS ABS: 4.8 10*3/uL (ref 1.7–7.7)
NEUTROS PCT: 63 %
NEUTROS PCT: 69 %
Neutro Abs: 4.4 10*3/uL (ref 1.7–7.7)
PLATELETS: 333 10*3/uL (ref 150–400)
Platelets: 318 10*3/uL (ref 150–400)
RBC: 3.57 MIL/uL — AB (ref 4.22–5.81)
RBC: 3.73 MIL/uL — AB (ref 4.22–5.81)
RDW: 13.7 % (ref 11.5–15.5)
RDW: 13.8 % (ref 11.5–15.5)
WBC: 6.4 10*3/uL (ref 4.0–10.5)
WBC: 7.5 10*3/uL (ref 4.0–10.5)

## 2016-11-05 LAB — RAPID URINE DRUG SCREEN, HOSP PERFORMED
AMPHETAMINES: NOT DETECTED
Amphetamines: NOT DETECTED
Barbiturates: NOT DETECTED
Barbiturates: NOT DETECTED
Benzodiazepines: NOT DETECTED
Benzodiazepines: NOT DETECTED
COCAINE: NOT DETECTED
Cocaine: NOT DETECTED
OPIATES: NOT DETECTED
OPIATES: NOT DETECTED
TETRAHYDROCANNABINOL: NOT DETECTED
Tetrahydrocannabinol: NOT DETECTED

## 2016-11-05 LAB — CBG MONITORING, ED
GLUCOSE-CAPILLARY: 219 mg/dL — AB (ref 65–99)
GLUCOSE-CAPILLARY: 499 mg/dL — AB (ref 65–99)
Glucose-Capillary: 233 mg/dL — ABNORMAL HIGH (ref 65–99)
Glucose-Capillary: 222 mg/dL — ABNORMAL HIGH (ref 65–99)
Glucose-Capillary: 374 mg/dL — ABNORMAL HIGH (ref 65–99)

## 2016-11-05 LAB — ETHANOL: Alcohol, Ethyl (B): <10 mg/dL (ref <10)

## 2016-11-05 LAB — TROPONIN I: Troponin I: <0.03 ng/mL (ref <0.03)

## 2016-11-05 MED ORDER — SODIUM CHLORIDE 0.9 % IV BOLUS (SEPSIS)
1000.0000 mL | Freq: Once | INTRAVENOUS | Status: AC
  Administered 2016-11-05: 1000 mL via INTRAVENOUS

## 2016-11-05 MED ORDER — ALUM & MAG HYDROXIDE-SIMETH 200-200-20 MG/5ML PO SUSP: 30.0000 mL | Freq: Four times a day (QID) | ORAL | Status: DC | PRN

## 2016-11-05 MED ORDER — SODIUM CHLORIDE 0.9 % IV SOLN
Freq: Once | INTRAVENOUS | Status: AC
  Administered 2016-11-05: 21:00:00 via INTRAVENOUS

## 2016-11-05 MED ORDER — GLYBURIDE 5 MG PO TABS
10.0000 mg | ORAL_TABLET | Freq: Two times a day (BID) | ORAL | Status: DC
  Administered 2016-11-05: 10 mg via ORAL
  Filled 2016-11-05 (×2): qty 2

## 2016-11-05 MED ORDER — INSULIN ASPART 100 UNIT/ML ~~LOC~~ SOLN
10.0000 [IU] | Freq: Once | SUBCUTANEOUS | Status: AC
  Administered 2016-11-05: 10 [IU] via INTRAVENOUS
  Filled 2016-11-05: qty 1

## 2016-11-05 MED ORDER — INSULIN ASPART 100 UNIT/ML ~~LOC~~ SOLN
0.0000 [IU] | Freq: Three times a day (TID) | SUBCUTANEOUS | Status: DC
  Administered 2016-11-05: 5 [IU] via SUBCUTANEOUS
  Filled 2016-11-05: qty 1

## 2016-11-05 MED ORDER — ATORVASTATIN CALCIUM 40 MG PO TABS
40.0000 mg | ORAL_TABLET | Freq: Every day | ORAL | Status: DC
  Filled 2016-11-05: qty 1

## 2016-11-05 MED ORDER — METFORMIN HCL 1000 MG PO TABS: 1000.0000 mg | ORAL_TABLET | Freq: Two times a day (BID) | ORAL | 0 refills | Status: DC

## 2016-11-05 MED ORDER — METOPROLOL TARTRATE 25 MG PO TABS
50.0000 mg | ORAL_TABLET | Freq: Two times a day (BID) | ORAL | Status: DC
  Administered 2016-11-05: 50 mg via ORAL
  Filled 2016-11-05: qty 2

## 2016-11-05 MED ORDER — GLYBURIDE 5 MG PO TABS: 5.0000 mg | ORAL_TABLET | Freq: Two times a day (BID) | ORAL | 0 refills | Status: DC

## 2016-11-05 MED ORDER — ONDANSETRON HCL 4 MG PO TABS: 4.0000 mg | ORAL_TABLET | Freq: Three times a day (TID) | ORAL | Status: DC | PRN

## 2016-11-05 MED ORDER — ZOLPIDEM TARTRATE 5 MG PO TABS: 5.0000 mg | ORAL_TABLET | Freq: Every evening | ORAL | Status: DC | PRN

## 2016-11-05 MED ORDER — METFORMIN HCL 500 MG PO TABS
1000.0000 mg | ORAL_TABLET | Freq: Two times a day (BID) | ORAL | Status: DC
  Administered 2016-11-05: 1000 mg via ORAL
  Filled 2016-11-05: qty 2

## 2016-11-05 MED ORDER — INSULIN ASPART 100 UNIT/ML ~~LOC~~ SOLN
10.0000 [IU] | Freq: Once | SUBCUTANEOUS | Status: AC
  Administered 2016-11-05: 10 [IU] via SUBCUTANEOUS
  Filled 2016-11-05: qty 1

## 2016-11-05 MED ORDER — IOPAMIDOL (ISOVUE-370) INJECTION 76%
INTRAVENOUS | Status: AC
  Administered 2016-11-05: 100 mL via INTRAVENOUS
  Filled 2016-11-05: qty 100

## 2016-11-05 MED ORDER — ASPIRIN EC 81 MG PO TBEC
81.0000 mg | DELAYED_RELEASE_TABLET | Freq: Every day | ORAL | Status: DC
  Administered 2016-11-05: 81 mg via ORAL
  Filled 2016-11-05: qty 1

## 2016-11-05 MED ORDER — NICOTINE 21 MG/24HR TD PT24: 21.0000 mg | MEDICATED_PATCH | Freq: Every day | TRANSDERMAL | Status: DC

## 2016-11-05 NOTE — ED Notes (Signed)
Pt eating snacks that he has in his backpack.

## 2016-11-05 NOTE — ED Notes (Signed)
Bed: WA20 Expected date:  Expected time:  Means of arrival:  Comments: 56 yo M/ Shortness of breath

## 2016-11-05 NOTE — ED Provider Notes (Signed)
[facility]-EMERGENCY DEPT Provider Note   CSN: [id number] Arrival date & time: 11/05/16  1948     History   Chief Complaint No chief complaint on file.   HPI Mike [name] is a 56 y.o. male. Chief complaint is "I'm still short of breath".  HPI:  Patient is noncompliant with medications. Was given prescriptions for his diabetic meds multiple times over the last few weeks for hospital admissions and ER stays.He is apparently not currently on insulin per his most recent chart discharges. He was discharged here. He went to the bus stop. He took the bus and walked from the bus stop to [facility] and called EMS stating that he was short of breath.  Past Medical History:  Diagnosis Date  . Anxiety   . Anxiety   . Chronic lower back pain   . Depression   . DKA (diabetic ketoacidoses) (HCC) 07/30/2016  . Hyperlipemia   . Hypertension   . Migraine    "last one was ~ 4 yr ago" (12/23/2014)  . Seizures (HCC)    "related to pills for anxiety; if I don't take the pills I'm suppose to take I'll have them" (12/23/2014)  . Type II diabetes mellitus [facility])     Patient Active Problem List   Diagnosis Date Noted  . Adjustment disorder with other symptoms 11/05/2016  . CKD (chronic kidney disease) 10/22/2016  . Uncontrolled type 2 diabetes mellitus with hyperosmolar nonketotic hyperglycemia (HCC) 08/24/2016  . Essential hypertension 08/24/2016  . Acute ischemic stroke (HCC)   . Acute renal failure superimposed on stage 2 chronic kidney disease (HCC) 07/31/2016  . Acute metabolic encephalopathy 07/31/2016  . DKA (diabetic ketoacidoses) (HCC) 06/24/2016  . Acute kidney injury (nontraumatic) (HCC) 06/24/2016  . Protein-calorie malnutrition, severe 12/24/2014  . Chest pain, pleuritic 12/23/2014  . Homelessness 12/23/2014  . Hypertensive urgency 12/23/2014  . DM (diabetes mellitus) (HCC) 12/23/2014  . Intermittent palpitations 12/23/2014  . Tobacco abuse 12/23/2014  .  Pleuritic chest pain 12/23/2014  . Abnormal EKG 12/23/2014  . Cough   . Type II diabetes mellitus with renal manifestations [facility])     Past Surgical History:  Procedure Laterality Date  . NO PAST SURGERIES         Home Medications    Prior to Admission medications   Medication Sig Start Date End Date Taking? Authorizing Provider  aspirin 81 MG tablet Take 1 tablet (81 mg total) by mouth daily. 11/04/16 12/04/16 Yes Lenox Ponds, MD  atorvastatin (LIPITOR) 40 MG tablet Take 1 tablet (40 mg total) by mouth daily. 11/04/16  Yes Randel Pigg, Dorma Russell, MD  metoprolol tartrate (LOPRESSOR) 50 MG tablet Take 1 tablet (50 mg total) by mouth 2 (two) times daily. 11/04/16  Yes Lenox Ponds, MD  blood glucose meter kit and supplies Dispense based on patient and insurance preference. Use up to four times daily as directed. (FOR ICD-9 250.00, 250.01). 10/27/16   Noralee Stain Chahn-Yang, DO  glyBURIDE (DIABETA) 5 MG tablet Take 1 tablet (5 mg total) by mouth 2 (two) times daily with a meal. 11/05/16   Rolland Porter, MD  metFORMIN (GLUCOPHAGE) 1000 MG tablet Take 1 tablet (1,000 mg total) by mouth 2 (two) times daily. 11/05/16   Rolland Porter, MD    Family History Family History  Problem Relation Age of Onset  . Diabetes Mellitus II Mother   . Diabetes Mellitus II Father     Social History Social History  Substance Use Topics  . Smoking  status: Current Every Day Smoker    Packs/day: 0.10    Years: 40.00    Types: Cigarettes  . Smokeless tobacco: Never Used  . Alcohol use 1.2 oz/week    2 Cans of beer per week     Allergies   Ibuprofen and Tylenol [acetaminophen]   Review of Systems Review of Systems  Constitutional: Negative for appetite change, chills, diaphoresis, fatigue and fever.  HENT: Negative for mouth sores, sore throat and trouble swallowing.   Eyes: Negative for visual disturbance.  Respiratory: Positive for shortness of breath. Negative for cough, chest  tightness and wheezing.   Cardiovascular: Negative for chest pain.  Gastrointestinal: Negative for abdominal distention, abdominal pain, diarrhea, nausea and vomiting.  Endocrine: Negative for polydipsia, polyphagia and polyuria.  Genitourinary: Negative for dysuria, frequency and hematuria.  Musculoskeletal: Negative for gait problem.  Skin: Negative for color change, pallor and rash.  Neurological: Negative for dizziness, syncope, light-headedness and headaches.  Hematological: Does not bruise/bleed easily.  Psychiatric/Behavioral: Negative for behavioral problems and confusion.     Physical Exam Updated Vital Signs BP (!) 150/87 (BP Location: Left Arm)   Pulse 64   Resp 20   SpO2 98%   Physical Exam  Constitutional: He is oriented to person, place, and time. He appears well-developed and well-nourished. No distress.  Minimally cooperative. Laying prone. Covers his head with a jacket multiple times as I attempt to evaluate him. Does not appear dyspneic. Speaking in full sentences.   HENT:  Head: Normocephalic.  Eyes: Pupils are equal, round, and reactive to light. Conjunctivae are normal. No scleral icterus.  Neck: Normal range of motion. Neck supple. No thyromegaly present.  Cardiovascular: Normal rate and regular rhythm.  Exam reveals no gallop and no friction rub.   No murmur heard. Pulmonary/Chest: Effort normal and breath sounds normal. No respiratory distress. He has no wheezes. He has no rales.  Not tachypneic.  Abdominal: Soft. Bowel sounds are normal. He exhibits no distension. There is no tenderness. There is no rebound.  Musculoskeletal: Normal range of motion.  Neurological: He is alert and oriented to person, place, and time.  Skin: Skin is warm and dry. No rash noted.  Psychiatric: He has a normal mood and affect. His behavior is normal.     ED Treatments / Results  Labs (all labs ordered are listed, but only abnormal results are displayed) Labs Reviewed    CBC WITH DIFFERENTIAL/PLATELET - Abnormal; Notable for the following:       Result Value   RBC 3.73 (*)    Hemoglobin 9.5 (*)    HCT 29.5 (*)    MCH 25.5 (*)    All other components within normal limits  COMPREHENSIVE METABOLIC PANEL - Abnormal; Notable for the following:    Sodium 133 (*)    Chloride 99 (*)    Glucose, Bld 533 (*)    Total Protein 6.4 (*)    Albumin 3.3 (*)    All other components within normal limits  CBG MONITORING, ED - Abnormal; Notable for the following:    Glucose-Capillary 499 (*)    All other components within normal limits  CBG MONITORING, ED - Abnormal; Notable for the following:    Glucose-Capillary 374 (*)    All other components within normal limits  ETHANOL  RAPID URINE DRUG SCREEN, HOSP PERFORMED  TROPONIN I    EKG  EKG Interpretation  Date/Time:  Sunday November 05 2016 20:28:17 EDT Ventricular Rate:  72 PR Interval:  QRS Duration: 89 QT Interval:  398 QTC Calculation: 436 R Axis:   42 Text Interpretation:  Sinus rhythm Consider left ventricular hypertrophy ST elev, probable normal early repol pattern --No change v comparison Baseline wander in lead(s) II III aVF Confirmed by Tanna Furry [phone]) on 11/05/2016 8:47:15 PM Also confirmed by Tanna Furry [phone]), editor Philomena Doheny [phone])  on 11/06/2016 7:04:08 AM       Radiology Ct Head Wo Contrast  Result Date: 11/05/2016 CLINICAL DATA:  Elevated glucose. Diabetic ketoacidosis. Seen for same frequently, with discharge from [facility] last night for same. Altered level of consciousness. EXAM: CT HEAD WITHOUT CONTRAST TECHNIQUE: Contiguous axial images were obtained from the base of the skull through the vertex without intravenous contrast. COMPARISON:  10/22/2016 FINDINGS: Brain: Diffuse cerebral atrophy. Mild ventricular dilatation consistent with central atrophy. Low-attenuation changes in the deep white matter consistent with small vessel ischemia. No evidence of acute infarction,  hemorrhage, hydrocephalus, extra-axial collection or mass lesion/mass effect. Vascular: No hyperdense vessel or unexpected calcification. Skull: Normal. Negative for fracture or focal lesion. Sinuses/Orbits: Retention cyst in the right maxillary antrum. Paranasal sinuses and mastoid air cells are otherwise clear. Other: No significant change since previous study. IMPRESSION: No acute intracranial abnormalities. Chronic atrophy and small vessel ischemic changes. Electronically Signed   By: Lucienne Capers M.D.   On: 11/05/2016 01:28   Ct Angio Chest Pe W And/or Wo Contrast  Result Date: 11/05/2016 CLINICAL DATA:  56 y/o M; shortness of breath, PE suspected, high pretest probability. EXAM: CT ANGIOGRAPHY CHEST WITH CONTRAST TECHNIQUE: Multidetector CT imaging of the chest was performed using the standard protocol during bolus administration of intravenous contrast. Multiplanar CT image reconstructions and MIPs were obtained to evaluate the vascular anatomy. CONTRAST:  100 cc Isovue 370 COMPARISON:  None. FINDINGS: Cardiovascular: Satisfactory opacification of the pulmonary arteries to the segmental level. No evidence of pulmonary embolism. Normal heart size. No pericardial effusion. Normal caliber thoracic aorta with mild calcific atherosclerosis. Mediastinum/Nodes: 11 mm nodule within left lower lobe of thyroid. No mediastinal adenopathy. Normal thoracic esophagus. Lungs/Pleura: Trace bilateral pleural effusions. Small clustered nodules and peribronchial thickening in the left lung base compatible with mild bronchitis/ bronchiolitis. No consolidation. No pneumothorax. Upper Abdomen: No acute abnormality. Musculoskeletal: No chest wall abnormality. No acute or significant osseous findings. Review of the MIP images confirms the above findings. IMPRESSION: 1. No pulmonary embolus identified. 2. Left lower lobe mild bronchitis/bronchiolitis. 3. Trace bilateral pleural effusions. Electronically Signed   By: Kristine Garbe M.D.   On: 11/05/2016 22:50   Dg Chest Port 1 View  Result Date: 11/05/2016 CLINICAL DATA:  Short of breath EXAM: PORTABLE CHEST 1 VIEW COMPARISON:  10/22/2016 FINDINGS: Heart size mildly enlarged. Negative for heart failure. Small left effusion and minimal left lower lobe airspace disease. Right lung remains clear. IMPRESSION: Small area of airspace disease in the left costophrenic angle with a small left effusion. Possible pneumonia or pulmonary embolism. Electronically Signed   By: Franchot Gallo M.D.   On: 11/05/2016 21:14    Procedures Procedures (including critical care time)  Medications Ordered in ED Medications  sodium chloride 0.9 % bolus 1,000 mL (0 mLs Intravenous Stopped 11/05/16 2321)  0.9 %  sodium chloride infusion ( Intravenous Stopped 11/05/16 2344)  sodium chloride 0.9 % bolus 1,000 mL (0 mLs Intravenous Stopped 11/05/16 2139)  iopamidol (ISOVUE-370) 76 % injection (100 mLs Intravenous Contrast Given 11/05/16 2154)  insulin aspart (novoLOG) injection 10 Units (10 Units Subcutaneous Given  11/05/16 2215)     Initial Impression / Assessment and Plan / ED Course  I have reviewed the triage vital signs and the nursing notes.  Pertinent labs & imaging results that were available during my care of the patient were reviewed by me and considered in my medical decision making (see chart for details).     Plan lab evaluation EKG chest x-ray enzyme evaluate blood sugar anion gap fluids. Reevaluation.  Final Clinical Impressions(s) / ED Diagnoses   Final diagnoses:  Hyperglycemia    New Prescriptions Discharge Medication List as of 11/05/2016 11:30 PM       Tanna Furry, MD 11/06/16 1605

## 2016-11-05 NOTE — BHH Counselor (Signed)
Per Nira ConnJason Berry, NP: Recommendation of continual observation for safety and stabilization.  Re-evaluation by psychiatry in the AM.  Jeneen MontgomeryWL-EDP, Glick, MD, and Freida BusmanAllen, RN notified at [phone].

## 2016-11-05 NOTE — BH Assessment (Signed)
Tele Assessment Note   Patient Name: Mike [name] MRN: [id number] Referring Physician: Dione Booze, MD Location of [facility]: [facility], [facility] Location of Provider: Behavioral Health TTS Department   Mike [name] is an 56 y.o.single male , voluntarily brought into [facility], by EMS.  Patient reported he was brought into the ED, due to an elevated blood sugar level associated with his diabetes.   Per medical records, Patient was discharged from [facility] on 11/04/2016.  Patient reported walking around since his discharge.  Patient stated that he was at a store, when EMS arrived.  Patient reported that he did not contact emergency services and was unsure who did.  Patient denies auditory hallucinations.  Per EDP note Mike Fleeting, MD): "He does admit to having some auditory hallucinations but cannot say what they are telling him."  Patient denies Suicidal ideations, homicidal ideations, visual hallucinations, self-injurious behaviors, substance use, or access to weapons.  Patient reported ongoing experiences with depressive symptoms, such as despondency, fatigue, and loss of interest in previously enjoyable activities.    Patient reported currently being homeless and having no financial resources, outside of the food stamps that he receives.  Patient identified recent stressors associated with being homeless for the previous 5 years.  Patient reported having no income to obtain his prescribed medications.  Patient denies any arrests, probation/parole, or upcoming court dates. Patient reported no history of physical, sexual, or verbal abuse.  Patient stated no family history of suicide and substance abuse.  Patient stated experience a loss in weight, however being unsure of the amount.   Patient reported no history of inpatient treatment or outpatient treatment for mental health or substance abuse.    During assessment, Patient was calm and cooperative.  Patient was dressed in his layered  levels of personal clothing which presented to be disheveled and poor hygiene.  Patient was oriented to the person, place, time, and situation.  Patient's eye contact was poor.  Patient's motor activity consisted of freedom of movement.  Patient's speech was logical, coherent, slow, slurred, and pressured.  Patient's level of consciousness was quiet and awake.  Patient's mood appeared to be despaired, helpless, and sad.  Patient's affect was sad and appropriate to circumstance.  Patient exhibited no level of anxiety.  Patient's thought process was coherent, relevant, and circumstantial.  Patient's judgment appeared to be unimpaired.   Diagnosis: Unspecified psychosis disorder  Past Medical History:  Past Medical History:  Diagnosis Date  . Anxiety   . Anxiety   . Chronic lower back pain   . Depression   . DKA (diabetic ketoacidoses) (HCC) 07/30/2016  . Hyperlipemia   . Hypertension   . Migraine    "last one was ~ 4 yr ago" (12/23/2014)  . Seizures (HCC)    "related to pills for anxiety; if I don't take the pills I'm suppose to take I'll have them" (12/23/2014)  . Type II diabetes mellitus (HCC)     Past Surgical History:  Procedure Laterality Date  . NO PAST SURGERIES      Family History:  Family History  Problem Relation Age of Onset  . Diabetes Mellitus II Mother   . Diabetes Mellitus II Father     Social History:  reports that he has been smoking Cigarettes.  He has a 4.00 pack-year smoking history. He has never used smokeless tobacco. He reports that he drinks about 1.2 oz of alcohol per week . He reports that he does not use drugs.  Additional Social  History:  Alcohol / Drug Use Pain Medications: See MAR Prescriptions: See MAR Over the Counter: See MAR History of alcohol / drug use?: No history of alcohol / drug abuse Longest period of sobriety (when/how long): N/A  CIWA: CIWA-Ar BP: (!) 144/81 Pulse Rate: 67 COWS:    PATIENT STRENGTHS: (choose at least two) Ability  for insight Average or above average intelligence Communication skills General fund of knowledge Motivation for treatment/growth  Allergies:  Allergies  Allergen Reactions  . Ibuprofen Nausea And Vomiting and Other (See Comments)    Reaction:  Bloating   . Tylenol [Acetaminophen] Nausea And Vomiting and Other (See Comments)    Reaction:  Bloating     Home Medications:  (Not in a hospital admission)  OB/GYN Status:  No LMP for male patient.  General Assessment Data Location of Assessment: [facility] TTS Assessment: In system Is this a Tele or Face-to-Face Assessment?: Tele Assessment Is this an Initial Assessment or a Re-assessment for this encounter?: Initial Assessment Marital status: Single Is patient pregnant?: No Pregnancy Status: No Living Arrangements: Other (Comment) (Pt. reported currently being homeless.) Can pt return to current living arrangement?: Yes Admission Status: Voluntary Is patient capable of signing voluntary admission?: Yes Referral Source: Self/Family/Friend Insurance type: None     Crisis Care Plan Living Arrangements: Other (Comment) (Pt. reported currently being homeless.) Legal Guardian: Other: (Self) Name of Psychiatrist: None Name of Therapist: None  Education Status Is patient currently in school?: No Current Grade: N/A Highest grade of school patient has completed: 10th Name of school: N/A Contact person: N/A  Risk to self with the past 6 months Suicidal Ideation: No (Patient denies.) Has patient been a risk to self within the past 6 months prior to admission? : No (Patient denies.) Suicidal Intent: No (Patient denies.) Has patient had any suicidal intent within the past 6 months prior to admission? : No (Patient denies.) Is patient at risk for suicide?: No Suicidal Plan?: No Has patient had any suicidal plan within the past 6 months prior to admission? : No (Patient denies.) Access to Means: No What has been your use of  drugs/alcohol within the last 12 months?: Patient denies. Previous Attempts/Gestures: No How many times?: 0 Other Self Harm Risks: Patient denies. Triggers for Past Attempts: None known Intentional Self Injurious Behavior: None Family Suicide History: No Recent stressful life event(s): Other (Comment), Financial Problems (Per Patient, being homeless) Persecutory voices/beliefs?: No Depression: Yes Depression Symptoms: Despondent, Fatigue, Loss of interest in usual pleasures Substance abuse history and/or treatment for substance abuse?: No Suicide prevention information given to non-admitted patients: Not applicable  Risk to Others within the past 6 months Homicidal Ideation: No (Patient denies.) Does patient have any lifetime risk of violence toward others beyond the six months prior to admission? : No (Patient denies.) Thoughts of Harm to Others: No Current Homicidal Intent: No Current Homicidal Plan: No Access to Homicidal Means: No (Patient denies.) Identified Victim: Patient denies. History of harm to others?: No Assessment of Violence: None Noted Violent Behavior Description: Patient denies. Does patient have access to weapons?: No Criminal Charges Pending?: No Does patient have a court date: No Is patient on probation?: No  Psychosis Hallucinations: Auditory (Patient denies. Refer to [facility] note (11/05/2016).) Delusions: None noted  Mental Status Report Appearance/Hygiene: Disheveled, Layered clothes, Poor hygiene Eye Contact: Poor Motor Activity: Freedom of movement Speech: Logical/coherent, Slow, Slurred, Pressured Level of Consciousness: Quiet/awake Mood: Despair, Helpless, Sad Affect: Sad, Appropriate to circumstance Anxiety Level: None Thought  Processes: Coherent, Relevant, Circumstantial Judgement: Unimpaired Orientation: Person, Place, Time, Situation Obsessive Compulsive Thoughts/Behaviors: None  Cognitive Functioning Concentration: Good Memory: Recent  Intact, Remote Intact IQ: Average Insight: Fair Impulse Control: Good Appetite: Poor Weight Loss: 0 Weight Gain: 0 Sleep: Decreased Total Hours of Sleep: 4 Vegetative Symptoms: Decreased grooming  ADLScreening [facility] Assessment Services) Patient's cognitive ability adequate to safely complete daily activities?: Yes Patient able to express need for assistance with ADLs?: Yes Independently performs ADLs?: Yes (appropriate for developmental age)  Prior Inpatient Therapy Prior Inpatient Therapy: No (Patient denies.) Prior Therapy Dates: None Prior Therapy Facilty/Provider(s): None Reason for Treatment: None  Prior Outpatient Therapy Prior Outpatient Therapy: No (Patient denies.) Prior Therapy Dates: None Prior Therapy Facilty/Provider(s): None Reason for Treatment: None Does patient have an ACCT team?: No Does patient have Intensive In-House Services?  : No Does patient have [facility] services? : No Does patient have P4CC services?: No  ADL Screening (condition at time of admission) Patient's cognitive ability adequate to safely complete daily activities?: Yes Is the patient deaf or have difficulty hearing?: No Does the patient have difficulty seeing, even when wearing glasses/contacts?: No Does the patient have difficulty concentrating, remembering, or making decisions?: No Patient able to express need for assistance with ADLs?: Yes Does the patient have difficulty dressing or bathing?: No Independently performs ADLs?: Yes (appropriate for developmental age) Does the patient have difficulty walking or climbing stairs?: No Weakness of Legs: None Weakness of Arms/Hands: None  Home Assistive Devices/Equipment Home Assistive Devices/Equipment: None    Abuse/Neglect Assessment (Assessment to be complete while patient is alone) Physical Abuse: Denies Verbal Abuse: Denies Sexual Abuse: Denies Exploitation of patient/patient's resources: Denies Self-Neglect: Denies      Merchant navy officer (For Healthcare) Does Patient Have a Medical Advance Directive?: No Would patient like information on creating a medical advance directive?: No - Patient declined    Additional Information 1:1 In Past 12 Months?: No CIRT Risk: No Elopement Risk: No Does patient have medical clearance?: Yes     Disposition:  Disposition Initial Assessment Completed for this Encounter: Yes Disposition of Patient: Other dispositions, Re-evaluation by Psychiatry recommended (Per Nira Conn, NP) Other disposition(s): Other (Comment) (Continual observation for safety and stabilzation.)  This service was provided via telemedicine using a 2-way, interactive audio and video technology.  Names of all persons participating in this telemedicine service and their role in this encounter.  Talbert Nan 11/05/2016 6:23 AM

## 2016-11-05 NOTE — [facility] Suicide Risk Assessment (Signed)
Suicide Risk Assessment  Discharge Assessment   [facility] Discharge Suicide Risk Assessment   Principal Problem: Adjustment disorder with other symptoms Discharge Diagnoses:  Patient Active Problem List   Diagnosis Date Noted  . Adjustment disorder with other symptoms [F43.29] 11/05/2016    Priority: High  . CKD (chronic kidney disease) [N18.9] 10/22/2016  . Uncontrolled type 2 diabetes mellitus with hyperosmolar nonketotic hyperglycemia (HCC) [E11.00] 08/24/2016  . Essential hypertension [I10] 08/24/2016  . Acute ischemic stroke (HCC) [I63.9]   . Acute renal failure superimposed on stage 2 chronic kidney disease (HCC) [N17.9, N18.2] 07/31/2016  . Acute metabolic encephalopathy [G93.41] 07/31/2016  . DKA (diabetic ketoacidoses) (HCC) [E13.10] 06/24/2016  . Acute kidney injury (nontraumatic) (HCC) [N17.9] 06/24/2016  . Protein-calorie malnutrition, severe [E43] 12/24/2014  . Chest pain, pleuritic [R07.81] 12/23/2014  . Homelessness [Z59.0] 12/23/2014  . Hypertensive urgency [I16.0] 12/23/2014  . DM (diabetes mellitus) (HCC) [E11.9] 12/23/2014  . Intermittent palpitations [R00.2] 12/23/2014  . Tobacco abuse [Z72.0] 12/23/2014  . Pleuritic chest pain [R07.81] 12/23/2014  . Abnormal EKG [R94.31] 12/23/2014  . Cough [R05]   . 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 rate 16, height 6\' 4"  (1.93 m), weight 66.2 kg (146 lb), SpO2 99 %.Body mass index is 17.77 kg/m.  General Appearance: Disheveled  Eye Contact::  Good  Speech:  Normal Rate409  Volume:  Normal  Mood:  Euthymic  Affect:  Congruent  Thought Process:  Coherent and Descriptions of Associations: Intact  Orientation:  Full  (Time, Place, and Person)  Thought Content:  WDL and Logical  Suicidal Thoughts:  No  Homicidal Thoughts:  No  Memory:  Immediate;   Good Recent;   Good Remote;   Good  Judgement:  Fair  Insight:  Fair  Psychomotor Activity:  Normal  Concentration:  Good  Recall:  Good  Fund of Knowledge:Fair  Language: Good  Akathisia:  No  Handed:  Right  AIMS (if indicated):     Assets:  Leisure Time Resilience  Sleep:     Cognition: WNL  ADL's:  Intact   Mental Status Per Nursing Assessment::   On Admission:   56 yo male who came to the ED with auditory hallucinations but none on assessment.  No suicidal/homicidal ideations or substance abuse.  He was a [facility] yesterday for medical issues, left and come to [facility].  Encouraged to go his follow-up with his medical providers.  Stable for discharge.  Demographic Factors:  Male  Loss Factors: NA  Historical Factors: NA  Risk Reduction Factors:   Sense of responsibility to family  Continued Clinical Symptoms:  None  Cognitive Features That Contribute To Risk:  None    Suicide Risk:  Minimal: No identifiable suicidal ideation.  Patients presenting with no risk factors but with morbid ruminations; may be classified as minimal risk based on the severity of the depressive symptoms  Follow-up Information    [facility] Follow up.   Specialty:  Behavioral Health Why:  Please follow up for outpatient resources.  Contact information: [address] [place] [address] [phone]           Plan Of Care/Follow-up recommendations:  Activity:  as tolerated Diet:  heart healthy  diet  Kennia Vanvorst, Catha NottinghamJAMISON, NP 11/05/2016, 12:06 PM

## 2016-11-05 NOTE — ED Notes (Signed)
Security called

## 2016-11-05 NOTE — ED Notes (Signed)
Per TTS:  Pt for observation and further evaluation by psychiatrist this morning.

## 2016-11-05 NOTE — Discharge Instructions (Signed)
FILL your prescriptions and TAKE you diabetic medications as prescribed.

## 2016-11-05 NOTE — ED Notes (Signed)
Breakfast Tray Given and bed sheets changed

## 2016-11-05 NOTE — ED Notes (Signed)
Pt given time to get dressed. He states, "Call security, I'm not going."

## 2016-11-05 NOTE — ED Provider Notes (Signed)
[facility] DEPT Provider Note   CSN: [id number] Arrival date & time: 11/04/16  2112     History   Chief Complaint Chief Complaint  Patient presents with  . Hyperglycemia    HPI Mike [name] is a 56 y.o. male.  The history is provided by the patient. The history is limited by the condition of the patient (Altered mental status).  He was brought in by EMS because of elevated blood sugar.  Patient is not able to articulate anything coherently.  He does admit to having some auditory hallucinations but cannot say what they are telling him.  Past Medical History:  Diagnosis Date  . Anxiety   . Anxiety   . Chronic lower back pain   . Depression   . DKA (diabetic ketoacidoses) ([place]) 07/30/2016  . Hyperlipemia   . Hypertension   . Migraine    "last one was ~ 4 yr ago" (12/23/2014)  . Seizures ([place])    "related to pills for anxiety; if I don't take the pills I'm suppose to take I'll have them" (12/23/2014)  . Type II diabetes mellitus [facility])     Patient Active Problem List   Diagnosis Date Noted  . CKD (chronic kidney disease) 10/22/2016  . Uncontrolled type 2 diabetes mellitus with hyperosmolar nonketotic hyperglycemia ([place]) 08/24/2016  . Essential hypertension 08/24/2016  . Acute ischemic stroke ([place])   . Acute renal failure superimposed on stage 2 chronic kidney disease ([place]) 07/31/2016  . Acute metabolic encephalopathy 10/93/2355  . DKA (diabetic ketoacidoses) ([place]) 06/24/2016  . Acute kidney injury (nontraumatic) ([place]) 06/24/2016  . Protein-calorie malnutrition, severe 12/24/2014  . Chest pain, pleuritic 12/23/2014  . Homelessness 12/23/2014  . Hypertensive urgency 12/23/2014  . DM (diabetes mellitus) ([place]) 12/23/2014  . Intermittent palpitations 12/23/2014  . Tobacco abuse 12/23/2014  . Pleuritic chest pain 12/23/2014  . Abnormal EKG 12/23/2014  . Cough   . Type II diabetes mellitus with renal manifestations [facility])     Past  Surgical History:  Procedure Laterality Date  . NO PAST SURGERIES         Home Medications    Prior to Admission medications   Medication Sig Start Date End Date Taking? Authorizing Provider  aspirin 81 MG tablet Take 1 tablet (81 mg total) by mouth daily. 11/04/16 12/04/16  Doreatha Lew, MD  atorvastatin (LIPITOR) 40 MG tablet Take 1 tablet (40 mg total) by mouth daily. 11/04/16   Doreatha Lew, MD  blood glucose meter kit and supplies Dispense based on patient and insurance preference. Use up to four times daily as directed. (FOR ICD-9 250.00, 250.01). 10/27/16   Dessa Phi Chahn-Yang, DO  glyBURIDE (DIABETA) 5 MG tablet Take 2 tablets (10 mg total) by mouth 2 (two) times daily with a meal. 11/04/16   Patrecia Pour, Christean Grief, MD  metFORMIN (GLUCOPHAGE) 1000 MG tablet Take 1 tablet (1,000 mg total) by mouth 2 (two) times daily with a meal. 11/04/16   Patrecia Pour, Christean Grief, MD  metoprolol tartrate (LOPRESSOR) 50 MG tablet Take 1 tablet (50 mg total) by mouth 2 (two) times daily. 11/04/16   Doreatha Lew, MD    Family History Family History  Problem Relation Age of Onset  . Diabetes Mellitus II Mother   . Diabetes Mellitus II Father     Social History Social History  Substance Use Topics  . Smoking status: Current Every Day Smoker    Packs/day: 0.10    Years: 40.00    Types:  Cigarettes  . Smokeless tobacco: Never Used  . Alcohol use 1.2 oz/week    2 Cans of beer per week     Allergies   Ibuprofen and Tylenol [acetaminophen]   Review of Systems Review of Systems  Unable to perform ROS: Mental status change     Physical Exam Updated Vital Signs BP (!) 164/92 (BP Location: Right Arm)   Pulse 91   Temp 99 F (37.2 C) (Oral)   Resp 16   Ht 6\' 4"  (1.93 m)   Wt 66.2 kg (146 lb)   SpO2 100%   BMI 17.77 kg/m   Physical Exam  Nursing note and vitals reviewed.  56 year old male, resting comfortably and in no acute distress. Vital signs are  significant for hypertension. Oxygen saturation is 100%, which is normal. Head is normocephalic and atraumatic. PERRLA, EOMI. Oropharynx is clear. Neck is nontender and supple without adenopathy or JVD. Back is nontender and there is no CVA tenderness. Lungs are clear without rales, wheezes, or rhonchi. Chest is nontender. Heart has regular rate and rhythm without murmur. Abdomen is soft, flat, nontender without masses or hepatosplenomegaly and peristalsis is normoactive. Extremities have no cyanosis or edema, full range of motion is present. Skin is warm and dry without rash. Neurologic: He is awake and alert but not speaking coherently.  He is able to follow commands. Cranial nerves are intact, there are no motor or sensory deficits.  ED Treatments / Results  Labs (all labs ordered are listed, but only abnormal results are displayed) Labs Reviewed  CBC WITH DIFFERENTIAL/PLATELET - Abnormal; Notable for the following:       Result Value   RBC 3.57 (*)    Hemoglobin 9.0 (*)    HCT 27.9 (*)    MCH 25.2 (*)    All other components within normal limits  URINALYSIS, ROUTINE W REFLEX MICROSCOPIC - Abnormal; Notable for the following:    Color, Urine COLORLESS (*)    Glucose, UA >=500 (*)    Bacteria, UA RARE (*)    Squamous Epithelial / LPF 0-5 (*)    All other components within normal limits  COMPREHENSIVE METABOLIC PANEL - Abnormal; Notable for the following:    Sodium 132 (*)    Glucose, Bld 484 (*)    Calcium 8.7 (*)    Total Protein 5.6 (*)    Albumin 2.9 (*)    All other components within normal limits  CBG MONITORING, ED - Abnormal; Notable for the following:    Glucose-Capillary 386 (*)    All other components within normal limits  CBG MONITORING, ED - Abnormal; Notable for the following:    Glucose-Capillary 233 (*)    All other components within normal limits  ETHANOL  RAPID URINE DRUG SCREEN, HOSP PERFORMED  CBG MONITORING, ED    Radiology Ct Head Wo  Contrast  Result Date: 11/05/2016 CLINICAL DATA:  Elevated glucose. Diabetic ketoacidosis. Seen for same frequently, with discharge from [facility] last night for same. Altered level of consciousness. EXAM: CT HEAD WITHOUT CONTRAST TECHNIQUE: Contiguous axial images were obtained from the base of the skull through the vertex without intravenous contrast. COMPARISON:  10/22/2016 FINDINGS: Brain: Diffuse cerebral atrophy. Mild ventricular dilatation consistent with central atrophy. Low-attenuation changes in the deep white matter consistent with small vessel ischemia. No evidence of acute infarction, hemorrhage, hydrocephalus, extra-axial collection or mass lesion/mass effect. Vascular: No hyperdense vessel or unexpected calcification. Skull: Normal. Negative for fracture or focal lesion. Sinuses/Orbits: Retention cyst  in the right maxillary antrum. Paranasal sinuses and mastoid air cells are otherwise clear. Other: No significant change since previous study. IMPRESSION: No acute intracranial abnormalities. Chronic atrophy and small vessel ischemic changes. Electronically Signed   By: Lucienne Capers M.D.   On: 11/05/2016 01:28    Procedures Procedures (including critical care time)  Medications Ordered in ED Medications  zolpidem (AMBIEN) tablet 5 mg (not administered)  ondansetron (ZOFRAN) tablet 4 mg (not administered)  alum & mag hydroxide-simeth (MAALOX/MYLANTA) 200-200-20 MG/5ML suspension 30 mL (not administered)  nicotine (NICODERM CQ - dosed in mg/24 hours) patch 21 mg (not administered)  insulin aspart (novoLOG) injection 0-15 Units (not administered)  aspirin tablet 81 mg (not administered)  atorvastatin (LIPITOR) tablet 40 mg (not administered)  glyBURIDE (DIABETA) tablet 10 mg (not administered)  metFORMIN (GLUCOPHAGE) tablet 1,000 mg (not administered)  metoprolol tartrate (LOPRESSOR) tablet 50 mg (not administered)  sodium chloride 0.9 % bolus 1,000 mL (0 mLs Intravenous Stopped  11/05/16 0538)  insulin aspart (novoLOG) injection 10 Units (10 Units Intravenous Given 11/05/16 0416)     Initial Impression / Assessment and Plan / ED Course  I have reviewed the triage vital signs and the nursing notes.  Pertinent labs & imaging results that were available during my care of the patient were reviewed by me and considered in my medical decision making (see chart for details).  With altered mental status of uncertain cause.  Old records are reviewed, and he had been discharged a few hours earlier after having been admitted for ketoacidosis.  Workup initiated for altered mental status.  CT of head is unremarkable.  Laboratory workup shows hyperglycemia and stable anemia.  Glucose has come down with fluids and insulin.  At this point, I suspect psychiatric cause of altered mentation, and TTS will be consulted.  TTS evaluation is appreciated.  Patient will be held in the ED for evaluation by psychiatry.  Final Clinical Impressions(s) / ED Diagnoses   Final diagnoses:  Altered mental status, unspecified altered mental status type  Hyperglycemia  Normochromic normocytic anemia    New Prescriptions New Prescriptions   No medications on file     Delora Fuel, MD 85/63/14 [phone]

## 2016-11-05 NOTE — ED Notes (Signed)
Bed: WHALA Expected date:  Expected time:  Means of arrival:  Comments: 

## 2016-11-05 NOTE — ED Notes (Signed)
Pt eating lunch prior to d/c.

## 2016-11-05 NOTE — ED Triage Notes (Signed)
[facility] He also states that he has been out of his insulin for weeks (pt given prescriptions for all his medications, including insulin, here earlier this week.) 99% on room air. 20g in RAC.

## 2016-11-05 NOTE — ED Notes (Signed)
Bed: WA20 Expected date:  Expected time:  Means of arrival:  Comments: 

## 2016-11-05 NOTE — ED Notes (Signed)
Pt incontinent on floor x 2 since 7am

## 2016-11-06 ENCOUNTER — Inpatient Hospital Stay (HOSPITAL_COMMUNITY)
Admission: EM | Admit: 2016-11-06 | Discharge: 2016-11-08 | DRG: 637 | Disposition: A | Discharge status: Home / Self Care | Payer: Self-pay | Attending: Internal Medicine | Admitting: Internal Medicine

## 2016-11-06 ENCOUNTER — Encounter (HOSPITAL_COMMUNITY): Payer: Self-pay | Admitting: *Deleted

## 2016-11-06 DIAGNOSIS — Z79899 Other long term (current) drug therapy: Secondary | ICD-10-CM

## 2016-11-06 DIAGNOSIS — Z720 Tobacco use: Secondary | ICD-10-CM | POA: Diagnosis present

## 2016-11-06 DIAGNOSIS — Z5900 Homelessness unspecified: Secondary | ICD-10-CM

## 2016-11-06 DIAGNOSIS — Z886 Allergy status to analgesic agent status: Secondary | ICD-10-CM

## 2016-11-06 DIAGNOSIS — Z833 Family history of diabetes mellitus: Secondary | ICD-10-CM

## 2016-11-06 DIAGNOSIS — F17210 Nicotine dependence, cigarettes, uncomplicated: Secondary | ICD-10-CM | POA: Diagnosis present

## 2016-11-06 DIAGNOSIS — Z23 Encounter for immunization: Secondary | ICD-10-CM

## 2016-11-06 DIAGNOSIS — D649 Anemia, unspecified: Secondary | ICD-10-CM | POA: Diagnosis present

## 2016-11-06 DIAGNOSIS — E785 Hyperlipidemia, unspecified: Secondary | ICD-10-CM | POA: Diagnosis present

## 2016-11-06 DIAGNOSIS — Z7984 Long term (current) use of oral hypoglycemic drugs: Secondary | ICD-10-CM

## 2016-11-06 DIAGNOSIS — R739 Hyperglycemia, unspecified: Secondary | ICD-10-CM

## 2016-11-06 DIAGNOSIS — F419 Anxiety disorder, unspecified: Secondary | ICD-10-CM | POA: Diagnosis present

## 2016-11-06 DIAGNOSIS — G8929 Other chronic pain: Secondary | ICD-10-CM | POA: Diagnosis present

## 2016-11-06 DIAGNOSIS — F329 Major depressive disorder, single episode, unspecified: Secondary | ICD-10-CM | POA: Diagnosis present

## 2016-11-06 DIAGNOSIS — E43 Unspecified severe protein-calorie malnutrition: Secondary | ICD-10-CM | POA: Diagnosis present

## 2016-11-06 DIAGNOSIS — D509 Iron deficiency anemia, unspecified: Secondary | ICD-10-CM | POA: Diagnosis present

## 2016-11-06 DIAGNOSIS — E1100 Type 2 diabetes mellitus with hyperosmolarity without nonketotic hyperglycemic-hyperosmolar coma (NKHHC): Secondary | ICD-10-CM | POA: Diagnosis present

## 2016-11-06 DIAGNOSIS — E1122 Type 2 diabetes mellitus with diabetic chronic kidney disease: Secondary | ICD-10-CM | POA: Diagnosis present

## 2016-11-06 DIAGNOSIS — M545 Low back pain: Secondary | ICD-10-CM | POA: Diagnosis present

## 2016-11-06 DIAGNOSIS — Z9114 Patient's other noncompliance with medication regimen: Secondary | ICD-10-CM

## 2016-11-06 DIAGNOSIS — I10 Essential (primary) hypertension: Secondary | ICD-10-CM | POA: Diagnosis present

## 2016-11-06 DIAGNOSIS — R9431 Abnormal electrocardiogram [ECG] [EKG]: Secondary | ICD-10-CM | POA: Diagnosis present

## 2016-11-06 DIAGNOSIS — I129 Hypertensive chronic kidney disease with stage 1 through stage 4 chronic kidney disease, or unspecified chronic kidney disease: Secondary | ICD-10-CM | POA: Diagnosis present

## 2016-11-06 DIAGNOSIS — N182 Chronic kidney disease, stage 2 (mild): Secondary | ICD-10-CM | POA: Diagnosis present

## 2016-11-06 DIAGNOSIS — Z681 Body mass index (BMI) 19 or less, adult: Secondary | ICD-10-CM

## 2016-11-06 DIAGNOSIS — E1165 Type 2 diabetes mellitus with hyperglycemia: Principal | ICD-10-CM | POA: Diagnosis present

## 2016-11-06 DIAGNOSIS — Z7982 Long term (current) use of aspirin: Secondary | ICD-10-CM

## 2016-11-06 DIAGNOSIS — G9341 Metabolic encephalopathy: Secondary | ICD-10-CM | POA: Diagnosis present

## 2016-11-06 LAB — URINALYSIS, ROUTINE W REFLEX MICROSCOPIC
Bacteria, UA: NONE SEEN
Bilirubin Urine: NEGATIVE
HGB URINE DIPSTICK: NEGATIVE
KETONES UR: NEGATIVE mg/dL
Leukocytes, UA: NEGATIVE
Nitrite: NEGATIVE
PROTEIN: NEGATIVE mg/dL
Specific Gravity, Urine: 1.026 (ref 1.005–1.030)
Squamous Epithelial / LPF: NONE SEEN
pH: 5 (ref 5.0–8.0)

## 2016-11-06 LAB — LIPASE, BLOOD: LIPASE: 38 U/L (ref 11–51)

## 2016-11-06 LAB — BLOOD GAS, VENOUS
ACID-BASE EXCESS: 1.3 mmol/L (ref 0.0–2.0)
Bicarbonate: 25.5 mmol/L (ref 20.0–28.0)
O2 Saturation: 68.3 %
PH VEN: 7.409 (ref 7.250–7.430)
Patient temperature: 98.6
pCO2, Ven: 41.1 mmHg — ABNORMAL LOW (ref 44.0–60.0)
pO2, Ven: 37.3 mmHg (ref 32.0–45.0)

## 2016-11-06 LAB — COMPREHENSIVE METABOLIC PANEL
ALT: 20 U/L (ref 17–63)
ANION GAP: 9 (ref 5–15)
AST: 26 U/L (ref 15–41)
Albumin: 3.4 g/dL — ABNORMAL LOW (ref 3.5–5.0)
Alkaline Phosphatase: 93 U/L (ref 38–126)
BILIRUBIN TOTAL: 0.8 mg/dL (ref 0.3–1.2)
BUN: 15 mg/dL (ref 6–20)
CHLORIDE: 96 mmol/L — AB (ref 101–111)
CO2: 23 mmol/L (ref 22–32)
Calcium: 8.9 mg/dL (ref 8.9–10.3)
Creatinine, Ser: 0.98 mg/dL (ref 0.61–1.24)
Glucose, Bld: 734 mg/dL (ref 65–99)
POTASSIUM: 4.8 mmol/L (ref 3.5–5.1)
Sodium: 128 mmol/L — ABNORMAL LOW (ref 135–145)
TOTAL PROTEIN: 6.3 g/dL — AB (ref 6.5–8.1)

## 2016-11-06 LAB — CBC
HEMATOCRIT: 27.7 % — AB (ref 39.0–52.0)
Hemoglobin: 8.8 g/dL — ABNORMAL LOW (ref 13.0–17.0)
MCH: 24.9 pg — AB (ref 26.0–34.0)
MCHC: 31.8 g/dL (ref 30.0–36.0)
MCV: 78.5 fL (ref 78.0–100.0)
PLATELETS: 316 10*3/uL (ref 150–400)
RBC: 3.53 MIL/uL — AB (ref 4.22–5.81)
RDW: 14 % (ref 11.5–15.5)
WBC: 5.1 10*3/uL (ref 4.0–10.5)

## 2016-11-06 LAB — CBG MONITORING, ED
GLUCOSE-CAPILLARY: 354 mg/dL — AB (ref 65–99)
Glucose-Capillary: >600 mg/dL (ref 65–99)
Glucose-Capillary: 275 mg/dL — ABNORMAL HIGH (ref 65–99)

## 2016-11-06 LAB — ETHANOL

## 2016-11-06 LAB — TROPONIN I: Troponin I: <0.03 ng/mL (ref <0.03)

## 2016-11-06 LAB — GLUCOSE, CAPILLARY: Glucose-Capillary: 253 mg/dL — ABNORMAL HIGH (ref 65–99)

## 2016-11-06 MED ORDER — ZOLPIDEM TARTRATE 5 MG PO TABS: 5.0000 mg | ORAL_TABLET | Freq: Every evening | ORAL | Status: DC | PRN

## 2016-11-06 MED ORDER — ASPIRIN 81 MG PO CHEW
81.0000 mg | CHEWABLE_TABLET | Freq: Every day | ORAL | Status: DC
  Administered 2016-11-06 – 2016-11-08 (×3): 81 mg via ORAL
  Filled 2016-11-06 (×3): qty 1

## 2016-11-06 MED ORDER — INSULIN GLARGINE 100 UNIT/ML ~~LOC~~ SOLN
10.0000 [IU] | Freq: Every day | SUBCUTANEOUS | Status: DC
  Administered 2016-11-06 – 2016-11-08 (×3): 10 [IU] via SUBCUTANEOUS
  Filled 2016-11-06 (×3): qty 0.1

## 2016-11-06 MED ORDER — INSULIN REGULAR HUMAN 100 UNIT/ML IJ SOLN
INTRAMUSCULAR | Status: AC
  Administered 2016-11-06: 4 [IU]/h via INTRAVENOUS
  Filled 2016-11-06: qty 1

## 2016-11-06 MED ORDER — SODIUM CHLORIDE 0.9 % IV BOLUS (SEPSIS)
2000.0000 mL | Freq: Once | INTRAVENOUS | Status: AC
  Administered 2016-11-06: 2000 mL via INTRAVENOUS

## 2016-11-06 MED ORDER — ATORVASTATIN CALCIUM 40 MG PO TABS
40.0000 mg | ORAL_TABLET | Freq: Every day | ORAL | Status: DC
  Administered 2016-11-06 – 2016-11-08 (×3): 40 mg via ORAL
  Filled 2016-11-06 (×3): qty 1

## 2016-11-06 MED ORDER — SODIUM CHLORIDE 0.9 % IV SOLN
INTRAVENOUS | Status: DC
  Administered 2016-11-06 – 2016-11-07 (×4): via INTRAVENOUS

## 2016-11-06 MED ORDER — ONDANSETRON HCL 4 MG/2ML IJ SOLN
4.0000 mg | Freq: Three times a day (TID) | INTRAMUSCULAR | Status: DC | PRN
Start: 2016-11-06 — End: 2016-11-08

## 2016-11-06 MED ORDER — INSULIN ASPART 100 UNIT/ML ~~LOC~~ SOLN
0.0000 [IU] | Freq: Three times a day (TID) | SUBCUTANEOUS | Status: DC
  Administered 2016-11-07: 1 [IU] via SUBCUTANEOUS
  Administered 2016-11-07 (×2): 5 [IU] via SUBCUTANEOUS
  Administered 2016-11-08: 7 [IU] via SUBCUTANEOUS
  Administered 2016-11-08: 5 [IU] via SUBCUTANEOUS

## 2016-11-06 MED ORDER — ENOXAPARIN SODIUM 40 MG/0.4ML ~~LOC~~ SOLN
40.0000 mg | SUBCUTANEOUS | Status: DC
  Administered 2016-11-06 – 2016-11-07 (×2): 40 mg via SUBCUTANEOUS
  Filled 2016-11-06 (×2): qty 0.4

## 2016-11-06 MED ORDER — SODIUM CHLORIDE 0.9 % IV BOLUS (SEPSIS)
1000.0000 mL | Freq: Once | INTRAVENOUS | Status: AC
  Administered 2016-11-06: 1000 mL via INTRAVENOUS

## 2016-11-06 MED ORDER — PNEUMOCOCCAL VAC POLYVALENT 25 MCG/0.5ML IJ INJ
0.5000 mL | INJECTION | INTRAMUSCULAR | Status: AC
  Administered 2016-11-07: 0.5 mL via INTRAMUSCULAR
  Filled 2016-11-06: qty 0.5

## 2016-11-06 MED ORDER — DM-GUAIFENESIN ER 30-600 MG PO TB12
1.0000 | ORAL_TABLET | Freq: Two times a day (BID) | ORAL | Status: DC
  Administered 2016-11-06 – 2016-11-08 (×4): 1 via ORAL
  Filled 2016-11-06 (×4): qty 1

## 2016-11-06 MED ORDER — NITROGLYCERIN 0.4 MG SL SUBL: 0.4000 mg | SUBLINGUAL_TABLET | SUBLINGUAL | Status: DC | PRN

## 2016-11-06 MED ORDER — INSULIN REGULAR BOLUS VIA INFUSION: 0.0000 [IU] | Freq: Three times a day (TID) | INTRAVENOUS | Status: DC

## 2016-11-06 MED ORDER — HYDRALAZINE HCL 20 MG/ML IJ SOLN: 5.0000 mg | INTRAMUSCULAR | Status: DC | PRN

## 2016-11-06 MED ORDER — DEXTROSE 50 % IV SOLN: 25.0000 mL | INTRAVENOUS | Status: DC | PRN

## 2016-11-06 MED ORDER — INSULIN ASPART 100 UNIT/ML ~~LOC~~ SOLN
10.0000 [IU] | Freq: Once | SUBCUTANEOUS | Status: AC
  Administered 2016-11-06: 10 [IU] via SUBCUTANEOUS
  Filled 2016-11-06: qty 1

## 2016-11-06 MED ORDER — HYDRALAZINE HCL 25 MG PO TABS
25.0000 mg | ORAL_TABLET | Freq: Three times a day (TID) | ORAL | Status: DC
  Administered 2016-11-06 – 2016-11-08 (×5): 25 mg via ORAL
  Filled 2016-11-06 (×4): qty 1

## 2016-11-06 MED ORDER — METOPROLOL TARTRATE 50 MG PO TABS
50.0000 mg | ORAL_TABLET | Freq: Two times a day (BID) | ORAL | Status: DC
  Administered 2016-11-06 – 2016-11-08 (×4): 50 mg via ORAL
  Filled 2016-11-06 (×4): qty 1

## 2016-11-06 MED ORDER — OXYCODONE HCL 5 MG PO TABS: 5.0000 mg | ORAL_TABLET | Freq: Four times a day (QID) | ORAL | Status: DC | PRN

## 2016-11-06 MED ORDER — INFLUENZA VAC SPLIT QUAD 0.5 ML IM SUSY
0.5000 mL | PREFILLED_SYRINGE | INTRAMUSCULAR | Status: AC
  Administered 2016-11-07: 0.5 mL via INTRAMUSCULAR
  Filled 2016-11-06: qty 0.5

## 2016-11-06 NOTE — ED Notes (Signed)
ED TO INPATIENT HANDOFF REPORT  Name/Age/Gender Mike [name] 56 y.o. male  Code Status    Code Status Orders        Start     Ordered   11/06/16 [place]  Full code  Continuous     11/06/16 1830    Code Status History    Date Active Date Inactive Code Status Order ID Comments User Context   11/05/2016  5:35 AM 11/05/2016  4:38 PM Full Code [id number]  Delora Fuel, MD ED   11/04/2016  1:07 AM 11/04/2016  9:12 PM Full Code [id number]  Gwynne Edinger, MD ED   10/22/2016  2:07 PM 10/27/2016  5:10 PM Full Code [id number]  Rondel Jumbo, PA-C ED   08/24/2016  7:45 PM 08/25/2016  6:32 PM Full Code [id number]  Ivor Costa, MD ED   07/31/2016  3:29 AM 08/06/2016  6:58 PM Full Code [id number]  Ivor Costa, MD ED   06/24/2016  4:04 PM 06/28/2016  6:33 PM Full Code [id number]  [name], NP ED   12/23/2014  8:03 AM 12/26/2014  5:56 PM Full Code [id number]  [facility]      Home/SNF/Other Home  Chief Complaint Hyperglycemia   Level of Care/Admitting Diagnosis ED Disposition    ED Disposition Condition Comment   Admit  Hospital Area: [facility] [[id number]]  Level of Care: Telemetry [5]  Admit to tele based on following criteria: Other see comments  Comments: AMS  Diagnosis: Type 2 diabetes mellitus with hyperosmolarity without nonketotic hyperglycemic-hyperosmolar coma [facility]) [facility]) [[id number]]  Admitting Physician: Ivor Costa [[id number]]  Attending Physician: Ivor Costa [phone]  Estimated length of stay: past midnight tomorrow  Certification:: I certify this patient will need inpatient services for at least 2 midnights  PT Class (Do Not Modify): Inpatient [101]  PT Acc Code (Do Not Modify): Private [1]       Medical History Past Medical History:  Diagnosis Date  . Anxiety   . Anxiety   . Chronic lower back pain   . Depression   . DKA (diabetic ketoacidoses) ([name]) 07/30/2016  . Hyperlipemia   . Hypertension   . Migraine    "last one was ~ 4 yr ago"  (12/23/2014)  . Seizures ([name])    "related to pills for anxiety; if I don't take the pills I'm suppose to take I'll have them" (12/23/2014)  . Type II diabetes mellitus ([facility])     Allergies Allergies  Allergen Reactions  . Ibuprofen Nausea And Vomiting and Other (See Comments)    Reaction:  Bloating   . Tylenol [Acetaminophen] Nausea And Vomiting and Other (See Comments)    Reaction:  Bloating     IV Location/Drains/Wounds Patient Lines/Drains/Airways Status   Active Line/Drains/Airways    Name:   Placement date:   Placement time:   Site:   Days:   Peripheral IV 11/06/16 Right Forearm  11/06/16    1447    Forearm    less than 1   Peripheral IV 11/06/16 Right Hand  11/06/16    1709    Hand    less than 1          Labs/Imaging Results for orders placed or performed during the hospital encounter of 11/06/16 (from the past 48 hour(s))  CBG monitoring, ED     Status: Abnormal   Collection Time: 11/06/16  3:06 PM  Result Value Ref Range   Glucose-Capillary >600 (HH) 65 - 99 mg/dL  CBC  Status: Abnormal   Collection Time: 11/06/16  3:20 PM  Result Value Ref Range   WBC 5.1 4.0 - 10.5 K/uL   RBC 3.53 (L) 4.22 - 5.81 MIL/uL   Hemoglobin 8.8 (L) 13.0 - 17.0 g/dL   HCT 27.7 (L) 39.0 - 52.0 %   MCV 78.5 78.0 - 100.0 fL   MCH 24.9 (L) 26.0 - 34.0 pg   MCHC 31.8 30.0 - 36.0 g/dL   RDW 14.0 11.5 - 15.5 %   Platelets 316 150 - 400 K/uL  Comprehensive metabolic panel     Status: Abnormal   Collection Time: 11/06/16  3:20 PM  Result Value Ref Range   Sodium 128 (L) 135 - 145 mmol/L   Potassium 4.8 3.5 - 5.1 mmol/L   Chloride 96 (L) 101 - 111 mmol/L   CO2 23 22 - 32 mmol/L   Glucose, Bld 734 (HH) 65 - 99 mg/dL    Comment: CRITICAL RESULT CALLED TO, READ BACK BY AND VERIFIED WITH: RAND,K @ 1625 ON [id number] BY POTEAT,S    BUN 15 6 - 20 mg/dL   Creatinine, Ser 0.98 0.61 - 1.24 mg/dL   Calcium 8.9 8.9 - 10.3 mg/dL   Total Protein 6.3 (L) 6.5 - 8.1 g/dL   Albumin 3.4 (L) 3.5 - 5.0  g/dL   AST 26 15 - 41 U/L   ALT 20 17 - 63 U/L   Alkaline Phosphatase 93 38 - 126 U/L   Total Bilirubin 0.8 0.3 - 1.2 mg/dL   GFR calc non Af Amer >60 >60 mL/min   GFR calc Af Amer >60 >60 mL/min    Comment: (NOTE) The eGFR has been calculated using the CKD EPI equation. This calculation has not been validated in all clinical situations. eGFR's persistently <60 mL/min signify possible Chronic Kidney Disease.    Anion gap 9 5 - 15  Lipase, blood     Status: None   Collection Time: 11/06/16  3:20 PM  Result Value Ref Range   Lipase 38 11 - 51 U/L  Ethanol     Status: None   Collection Time: 11/06/16  3:20 PM  Result Value Ref Range   Alcohol, Ethyl (B) <10 <10 mg/dL    Comment:        LOWEST DETECTABLE LIMIT FOR SERUM ALCOHOL IS 10 mg/dL FOR MEDICAL PURPOSES ONLY   Urinalysis, Routine w reflex microscopic     Status: Abnormal   Collection Time: 11/06/16  3:22 PM  Result Value Ref Range   Color, Urine COLORLESS (A) YELLOW   APPearance CLEAR CLEAR   Specific Gravity, Urine 1.026 1.005 - 1.030   pH 5.0 5.0 - 8.0   Glucose, UA >=500 (A) NEGATIVE mg/dL   Hgb urine dipstick NEGATIVE NEGATIVE   Bilirubin Urine NEGATIVE NEGATIVE   Ketones, ur NEGATIVE NEGATIVE mg/dL   Protein, ur NEGATIVE NEGATIVE mg/dL   Nitrite NEGATIVE NEGATIVE   Leukocytes, UA NEGATIVE NEGATIVE   RBC / HPF 0-5 0 - 5 RBC/hpf   WBC, UA 0-5 0 - 5 WBC/hpf   Bacteria, UA NONE SEEN NONE SEEN   Squamous Epithelial / LPF NONE SEEN NONE SEEN  Blood gas, venous     Status: Abnormal   Collection Time: 11/06/16  3:42 PM  Result Value Ref Range   pH, Ven 7.409 7.250 - 7.430   pCO2, Ven 41.1 (L) 44.0 - 60.0 mmHg   pO2, Ven 37.3 32.0 - 45.0 mmHg   Bicarbonate 25.5 20.0 - 28.0 mmol/L  Acid-Base Excess 1.3 0.0 - 2.0 mmol/L   O2 Saturation 68.3 %   Patient temperature 98.6    Collection site VENOUS    Drawn by DRAWN BY RN    Sample type VENOUS   CBG monitoring, ED     Status: Abnormal   Collection Time: 11/06/16   6:52 PM  Result Value Ref Range   Glucose-Capillary 354 (H) 65 - 99 mg/dL   Ct Head Wo Contrast  Result Date: 11/05/2016 CLINICAL DATA:  Elevated glucose. Diabetic ketoacidosis. Seen for same frequently, with discharge from [facility] last night for same. Altered level of consciousness. EXAM: CT HEAD WITHOUT CONTRAST TECHNIQUE: Contiguous axial images were obtained from the base of the skull through the vertex without intravenous contrast. COMPARISON:  10/22/2016 FINDINGS: Brain: Diffuse cerebral atrophy. Mild ventricular dilatation consistent with central atrophy. Low-attenuation changes in the deep white matter consistent with small vessel ischemia. No evidence of acute infarction, hemorrhage, hydrocephalus, extra-axial collection or mass lesion/mass effect. Vascular: No hyperdense vessel or unexpected calcification. Skull: Normal. Negative for fracture or focal lesion. Sinuses/Orbits: Retention cyst in the right maxillary antrum. Paranasal sinuses and mastoid air cells are otherwise clear. Other: No significant change since previous study. IMPRESSION: No acute intracranial abnormalities. Chronic atrophy and small vessel ischemic changes. Electronically Signed   By: Lucienne Capers M.D.   On: 11/05/2016 01:28   Ct Angio Chest Pe W And/or Wo Contrast  Result Date: 11/05/2016 CLINICAL DATA:  56 y/o M; shortness of breath, PE suspected, high pretest probability. EXAM: CT ANGIOGRAPHY CHEST WITH CONTRAST TECHNIQUE: Multidetector CT imaging of the chest was performed using the standard protocol during bolus administration of intravenous contrast. Multiplanar CT image reconstructions and MIPs were obtained to evaluate the vascular anatomy. CONTRAST:  100 cc Isovue 370 COMPARISON:  None. FINDINGS: Cardiovascular: Satisfactory opacification of the pulmonary arteries to the segmental level. No evidence of pulmonary embolism. Normal heart size. No pericardial effusion. Normal caliber thoracic aorta with mild  calcific atherosclerosis. Mediastinum/Nodes: 11 mm nodule within left lower lobe of thyroid. No mediastinal adenopathy. Normal thoracic esophagus. Lungs/Pleura: Trace bilateral pleural effusions. Small clustered nodules and peribronchial thickening in the left lung base compatible with mild bronchitis/ bronchiolitis. No consolidation. No pneumothorax. Upper Abdomen: No acute abnormality. Musculoskeletal: No chest wall abnormality. No acute or significant osseous findings. Review of the MIP images confirms the above findings. IMPRESSION: 1. No pulmonary embolus identified. 2. Left lower lobe mild bronchitis/bronchiolitis. 3. Trace bilateral pleural effusions. Electronically Signed   By: Kristine Garbe M.D.   On: 11/05/2016 22:50   Dg Chest Port 1 View  Result Date: 11/05/2016 CLINICAL DATA:  Short of breath EXAM: PORTABLE CHEST 1 VIEW COMPARISON:  10/22/2016 FINDINGS: Heart size mildly enlarged. Negative for heart failure. Small left effusion and minimal left lower lobe airspace disease. Right lung remains clear. IMPRESSION: Small area of airspace disease in the left costophrenic angle with a small left effusion. Possible pneumonia or pulmonary embolism. Electronically Signed   By: Franchot Gallo M.D.   On: 11/05/2016 21:14    Pending Labs Unresulted Labs    Start     Ordered   11/07/16 9678  Basic metabolic panel  Tomorrow morning,   R     11/06/16 1830   11/07/16 0500  CBC  Tomorrow morning,   R     11/06/16 1830   11/06/16 1921  Troponin I (q 6hr x 3)  Now then every 6 hours,   R     11/06/16  1920   11/06/16 1910  Troponin I  STAT,   STAT     11/06/16 1909      Vitals/Pain Today's Vitals   11/06/16 1655 11/06/16 1700 11/06/16 1816 11/06/16 1937  BP: (!) 169/83 (!) 177/79 (!) 159/88 (!) 168/86  Pulse: 66 77 80 80  Resp: 20   (!) 26  SpO2: 100% 100% 100% 97%  PainSc:        Isolation Precautions No active isolations  Medications Medications  insulin regular (NOVOLIN  R,HUMULIN R) 100 Units in sodium chloride 0.9 % 100 mL (1 Units/mL) infusion (2.9 Units/hr Intravenous Rate/Dose Change 11/06/16 1909)  dextrose 50 % solution 25 mL (not administered)  0.9 %  sodium chloride infusion ( Intravenous New Bag/Given 11/06/16 1813)  sodium chloride 0.9 % bolus 2,000 mL (2,000 mLs Intravenous New Bag/Given 11/06/16 1906)  aspirin tablet 81 mg (not administered)  atorvastatin (LIPITOR) tablet 40 mg (not administered)  metoprolol tartrate (LOPRESSOR) tablet 50 mg (not administered)  insulin glargine (LANTUS) injection 10 Units (10 Units Subcutaneous Given 11/06/16 1905)  insulin aspart (novoLOG) injection 0-9 Units (not administered)  ondansetron (ZOFRAN) injection 4 mg (not administered)  hydrALAZINE (APRESOLINE) injection 5 mg (not administered)  zolpidem (AMBIEN) tablet 5 mg (not administered)  hydrALAZINE (APRESOLINE) tablet 25 mg (not administered)  dextromethorphan-guaiFENesin (MUCINEX DM) 30-600 MG per 12 hr tablet 1 tablet (not administered)  enoxaparin (LOVENOX) injection 40 mg (not administered)  pneumococcal 23 valent vaccine (PNU-IMMUNE) injection 0.5 mL (not administered)  Influenza vac split quadrivalent PF (FLUARIX) injection 0.5 mL (not administered)  nitroGLYCERIN (NITROSTAT) SL tablet 0.4 mg (not administered)  sodium chloride 0.9 % bolus 1,000 mL (0 mLs Intravenous Stopped 11/06/16 1730)  insulin aspart (novoLOG) injection 10 Units (10 Units Subcutaneous Given 11/06/16 1642)    Mobility walks

## 2016-11-06 NOTE — ED Notes (Signed)
Bed: WA04 Expected date:  Expected time:  Means of arrival:  Comments: EMS- hyperglycemia  

## 2016-11-06 NOTE — H&P (Signed)
History and Physical    Mike [name] JKD:[id number] DOB: Jun 15, 1960 DOA: 11/06/2016  Referring MD/NP/PA:   PCP: [name], NP   Patient coming from:  The patient is homeless.  At baseline, pt is independent for most of ADL.  Chief Complaint: confusion and hyperglycemia  HPI: Mike [name] is a 56 y.o. male with medical history significant of homeless, medication noncompliance, hypertension, hyperlipidemia, diabetes mellitus, depression, anxiety, seizure, migraine headache, tobacco abuse, CKD-2, medication noncompliance, who presents with confusion.  [facility]  Per report, pt was noted to be confused and disoriented. His blood sugar was elevated>600. He is in poor hygiene. When saw patient in the emergency room, he is oriented to place and person, but not time. He answered some questions. He has some dry cough, but he denies any chest pain, shortness breath. He has had 2 loose stool bowel movements, no nausea, vomiting or abdominal pain. Patient denies symptoms of UTI. He moves all extremities normally  ED Course: pt was found to have WBC 5.1, negative troponin, lipase is 38, negative urinalysis, pseudohyponatremia, blood sugar 734, normal anion gap, alcohol level less than 10 creatinine normal, no tachycardia, oxygen saturation 100% on room air. Patient is admitted to telemetry bed as inpatient.  Review of Systems:   General: no fevers, chills, no body weight gain, has poor appetite, has fatigue HEENT: no blurry vision, hearing changes or sore throat Respiratory: no dyspnea, has coughing, no wheezing CV: no chest pain, no palpitations GI: no nausea, vomiting, abdominal pain, has diarrhea, no constipation GU: no dysuria, burning on urination, increased urinary frequency, hematuria  Ext: no leg edema Neuro: no unilateral weakness, numbness, or tingling, no vision change or hearing loss Skin: no rash, no skin tear. MSK: No muscle spasm, no deformity, no  limitation of range of movement in spin Heme: No easy bruising.  Travel history: No recent long distant travel.  Allergy:  Allergies  Allergen Reactions  . Ibuprofen Nausea And Vomiting and Other (See Comments)    Reaction:  Bloating   . Tylenol [Acetaminophen] Nausea And Vomiting and Other (See Comments)    Reaction:  Bloating     Past Medical History:  Diagnosis Date  . Anxiety   . Anxiety   . Chronic lower back pain   . Depression   . DKA (diabetic ketoacidoses) ([place]) 07/30/2016  . Hyperlipemia   . Hypertension   . Migraine    "last one was ~ 4 yr ago" (12/23/2014)  . Seizures ([place])    "related to pills for anxiety; if I don't take the pills I'm suppose to take I'll have them" (12/23/2014)  . Type II diabetes mellitus ([place])     Past Surgical History:  Procedure Laterality Date  . NO PAST SURGERIES      Social History:  reports that he has been smoking Cigarettes.  He has a 4.00 pack-year smoking history. He has never used smokeless tobacco. He reports that he drinks about 1.2 oz of alcohol per week . He reports that he does not use drugs.  Family History:  Family History  Problem Relation Age of Onset  . Diabetes Mellitus II Mother   . Diabetes Mellitus II Father      Prior to Admission medications   Medication Sig Start Date End Date Taking? Authorizing Provider  aspirin 81 MG tablet Take 1 tablet (81 mg total) by mouth daily. 11/04/16 12/04/16  Doreatha Lew, MD  atorvastatin (LIPITOR) 40 MG tablet Take 1  tablet (40 mg total) by mouth daily. 11/04/16   Doreatha Lew, MD  blood glucose meter kit and supplies Dispense based on patient and insurance preference. Use up to four times daily as directed. (FOR ICD-9 250.00, 250.01). 10/27/16   Dessa Phi Chahn-Yang, DO  glyBURIDE (DIABETA) 5 MG tablet Take 1 tablet (5 mg total) by mouth 2 (two) times daily with a meal. 11/05/16   Tanna Furry, MD  metFORMIN (GLUCOPHAGE) 1000 MG tablet Take 1 tablet (1,000 mg  total) by mouth 2 (two) times daily. 11/05/16   Tanna Furry, MD  metoprolol tartrate (LOPRESSOR) 50 MG tablet Take 1 tablet (50 mg total) by mouth 2 (two) times daily. 11/04/16   Doreatha Lew, MD    Physical Exam: Vitals:   11/06/16 1655 11/06/16 1700 11/06/16 1816 11/06/16 1937  BP: (!) 169/83 (!) 177/79 (!) 159/88 (!) 168/86  Pulse: 66 77 80 80  Resp: 20   (!) 26  SpO2: 100% 100% 100% 97%   General: Not in acute distress. Poor hygiene, dry mucus and membrane HEENT:       Eyes: PERRL, EOMI, no scleral icterus.       ENT: No discharge from the ears and nose, no pharynx injection, no tonsillar enlargement.        Neck: No JVD, no bruit, no mass felt. Heme: No neck lymph node enlargement. Cardiac: S1/S2, RRR, No murmurs, No gallops or rubs. Respiratory: No rales, wheezing, rhonchi or rubs. GI: Soft, nondistended, nontender, no rebound pain, no organomegaly, BS present. GU: No hematuria Ext: No pitting leg edema bilaterally. 2+DP/PT pulse bilaterally. Musculoskeletal: No joint deformities, No joint redness or warmth, no limitation of ROM in spin. Skin: No rashes.  Neuro: oriented to place and person, but not time. Cranial nerves II-XII grossly intact, moves all extremities normally.   Psych: Patient is not psychotic, no suicidal or hemocidal ideation.  Labs on Admission: I have personally reviewed following labs and imaging studies  CBC:  Recent Labs Lab 11/02/16 1407  11/03/16 2133 11/04/16 0210 11/05/16 0038 11/05/16 2028 11/06/16 1520  WBC 7.5  --  8.5 8.5 7.5 6.4 5.1  NEUTROABS 5.4  --   --   --  4.8 4.4  --   HGB 10.6*  < > 9.5* 9.0* 9.0* 9.5* 8.8*  HCT 34.2*  < > 30.6* 28.0* 27.9* 29.5* 27.7*  MCV 80.7  --  79.5 79.3 78.2 79.1 78.5  PLT 434*  --  339 328 318 333 316  < > = values in this interval not displayed. Basic Metabolic Panel:  Recent Labs Lab 11/04/16 0952 11/04/16 1214 11/05/16 0048 11/05/16 2028 11/06/16 1520  NA 135 134* 132* 133* 128*  K  3.1* 3.5 4.3 4.5 4.8  CL 103 101 101 99* 96*  CO2 _0 GLUCOSE 145* 261* 484* 533* 734*  BUN _1 CREATININE 0.65 0.72 0.92 0.96 0.98  CALCIUM 8.4* 8.4* 8.7* 9.1 8.9  MG  --  1.6*  --   --   --    GFR: Estimated Creatinine Clearance: 80.7 mL/min (by C-G formula based on SCr of 0.98 mg/dL). Liver Function Tests:  Recent Labs Lab 11/03/16 2133 11/05/16 0048 11/05/16 2028 11/06/16 1520  AST 14* _2 ALT _3 ALKPHOS 88 71 81 93  BILITOT 1.0 0.7 0.5 0.8  PROT 6.3* 5.6* 6.4* 6.3*  ALBUMIN 3.3* 2.9* 3.3* 3.4*    Recent  Labs Lab 11/06/16 1520  LIPASE 38   No results for input(s): AMMONIA in the last 168 hours. Coagulation Profile: No results for input(s): INR, PROTIME in the last 168 hours. Cardiac Enzymes:  Recent Labs Lab 11/02/16 1407 11/05/16 2106  CKTOTAL 83  --   TROPONINI  --  <0.03   BNP (last 3 results) No results for input(s): PROBNP in the last 8760 hours. HbA1C: No results for input(s): HGBA1C in the last 72 hours. CBG:  Recent Labs Lab 11/05/16 1259 11/05/16 2050 11/05/16 2313 11/06/16 1506 11/06/16 1852  GLUCAP 219* 499* 374* >600* 354*   Lipid Profile: No results for input(s): CHOL, HDL, LDLCALC, TRIG, CHOLHDL, LDLDIRECT in the last 72 hours. Thyroid Function Tests: No results for input(s): TSH, T4TOTAL, FREET4, T3FREE, THYROIDAB in the last 72 hours. Anemia Panel: No results for input(s): VITAMINB12, FOLATE, FERRITIN, TIBC, IRON, RETICCTPCT in the last 72 hours. Urine analysis:    Component Value Date/Time   COLORURINE COLORLESS (A) 11/06/2016 1522   APPEARANCEUR CLEAR 11/06/2016 1522   LABSPEC 1.026 11/06/2016 1522   PHURINE 5.0 11/06/2016 1522   GLUCOSEU >=500 (A) 11/06/2016 1522   HGBUR NEGATIVE 11/06/2016 1522   BILIRUBINUR NEGATIVE 11/06/2016 1522   KETONESUR NEGATIVE 11/06/2016 1522   PROTEINUR NEGATIVE 11/06/2016 1522   UROBILINOGEN 1.0 02/07/2011 1605   NITRITE NEGATIVE 11/06/2016 1522    LEUKOCYTESUR NEGATIVE 11/06/2016 1522   Sepsis Labs: _0 (procalcitonin:4,lacticidven:4) ) Recent Results (from the past 240 hour(s))  MRSA PCR Screening     Status: None   Collection Time: 11/04/16  1:36 AM  Result Value Ref Range Status   MRSA by PCR NEGATIVE NEGATIVE Final    Comment:        The GeneXpert MRSA Assay (FDA approved for NASAL specimens only), is one component of a comprehensive MRSA colonization surveillance program. It is not intended to diagnose MRSA infection nor to guide or monitor treatment for MRSA infections.      Radiological Exams on Admission: Ct Head Wo Contrast  Result Date: 11/05/2016 CLINICAL DATA:  Elevated glucose. Diabetic ketoacidosis. Seen for same frequently, with discharge from [facility] last night for same. Altered level of consciousness. EXAM: CT HEAD WITHOUT CONTRAST TECHNIQUE: Contiguous axial images were obtained from the base of the skull through the vertex without intravenous contrast. COMPARISON:  10/22/2016 FINDINGS: Brain: Diffuse cerebral atrophy. Mild ventricular dilatation consistent with central atrophy. Low-attenuation changes in the deep white matter consistent with small vessel ischemia. No evidence of acute infarction, hemorrhage, hydrocephalus, extra-axial collection or mass lesion/mass effect. Vascular: No hyperdense vessel or unexpected calcification. Skull: Normal. Negative for fracture or focal lesion. Sinuses/Orbits: Retention cyst in the right maxillary antrum. Paranasal sinuses and mastoid air cells are otherwise clear. Other: No significant change since previous study. IMPRESSION: No acute intracranial abnormalities. Chronic atrophy and small vessel ischemic changes. Electronically Signed   By: Lucienne Capers M.D.   On: 11/05/2016 01:28   Ct Angio Chest Pe W And/or Wo Contrast  Result Date: 11/05/2016 CLINICAL DATA:  56 y/o M; shortness of breath, PE suspected, high pretest probability. EXAM: CT ANGIOGRAPHY  CHEST WITH CONTRAST TECHNIQUE: Multidetector CT imaging of the chest was performed using the standard protocol during bolus administration of intravenous contrast. Multiplanar CT image reconstructions and MIPs were obtained to evaluate the vascular anatomy. CONTRAST:  100 cc Isovue 370 COMPARISON:  None. FINDINGS: Cardiovascular: Satisfactory opacification of the pulmonary arteries to the segmental level. No evidence of pulmonary embolism. Normal heart size. No pericardial effusion.  Normal caliber thoracic aorta with mild calcific atherosclerosis. Mediastinum/Nodes: 11 mm nodule within left lower lobe of thyroid. No mediastinal adenopathy. Normal thoracic esophagus. Lungs/Pleura: Trace bilateral pleural effusions. Small clustered nodules and peribronchial thickening in the left lung base compatible with mild bronchitis/ bronchiolitis. No consolidation. No pneumothorax. Upper Abdomen: No acute abnormality. Musculoskeletal: No chest wall abnormality. No acute or significant osseous findings. Review of the MIP images confirms the above findings. IMPRESSION: 1. No pulmonary embolus identified. 2. Left lower lobe mild bronchitis/bronchiolitis. 3. Trace bilateral pleural effusions. Electronically Signed   By: Kristine Garbe M.D.   On: 11/05/2016 22:50   Dg Chest Port 1 View  Result Date: 11/05/2016 CLINICAL DATA:  Short of breath EXAM: PORTABLE CHEST 1 VIEW COMPARISON:  10/22/2016 FINDINGS: Heart size mildly enlarged. Negative for heart failure. Small left effusion and minimal left lower lobe airspace disease. Right lung remains clear. IMPRESSION: Small area of airspace disease in the left costophrenic angle with a small left effusion. Possible pneumonia or pulmonary embolism. Electronically Signed   By: Franchot Gallo M.D.   On: 11/05/2016 21:14     EKG:  Reviewed independently. EKG showed ST elevation in V2, J-point elevation in V3-V6. Chart review showed that he had J-point elevation in V2-V6 on  12/20/14. He also had J-point elevation on 08/25/16.  Assessment/Plan Principal Problem:   Uncontrolled type 2 diabetes mellitus with hyperosmolar nonketotic hyperglycemia (HCC) Active Problems:   Homelessness   Tobacco abuse   Abnormal EKG   Protein-calorie malnutrition, severe   Acute metabolic encephalopathy   Essential hypertension   Type 2 diabetes mellitus with hyperosmolarity without nonketotic hyperglycemic-hyperosmolar coma [facility]) (HCC)   Normocytic anemia   HLD (hyperlipidemia)  Uncontrolled type 2 diabetes mellitus with hyperosmolar nonketotic hyperglycemia ([place]): this is due to medication noncompliance. Blood sugar 734, normal anion gap, no DKA. Patient has altered mental status, but not in coma. -pt was started with insulin gtt in ED, CBG 734-->455 now  - admit to tele bed asinpt - will start lantus 10 units now. After one hour after giving Lantus, will d/c insulin gtt, and start SSI - IVF: 3L NS bolus, then 125 cc/h - check CBG q1h - Zofran prn nausea  - NPO   Homelessness: - consult to SW and case manager  CKD-II: stable. Baseline creatinine 1.2. His creatinine is 0.98, BUN 15 -Follow-up renal function by BMP  Tobacco abuse: -Nicotine patch  Type II diabetes mellitus with renal manifestations ([place]): Last A1c 12.4 on 10/22/16, poorly controled. Patient was on mixed insulin 70/30 before, which was switched to metformin and glyburide, stool not being compliant.  -Lantus 10 units daily -SSI  Acute metabolic encephalopathy: likely due to hyperosmolar nonketotic hyperglycemia. -will treat hyperosmolar nonketotic hyperglycemia as above -Frequent neuro check -If blood sugar is controlled, and no improvement of mental status, will consider to get CT head  Malnutrition of severe degree: -Consult nutrition  Hypertension: Blood pressure elevated 177/79. Likely due to medication noncompliance -Continue home metoprolol - add hydralazine orally, 25 mg tid -IV  hydralazine when necessary  HLD:  -lipitor  Normocytic anemia:  Hemoglobin 9-10. His hemoglobin 8.8, close to baseline -Follow-up by CBC  Abnormal EKG: EKG showed ST elevation in V2, J-point elevation in V3-V6. Chart review showed that he had J-point elevation in V2-V6 on 12/20/14. He also had J-point elevation on 08/25/16. I spoke with Dr. Einar Gip of Cardiology on the phone. Per Dr. Einar Gip, since pt does not have chest pain and had similar EKG changes  in the past, will not consider as myocardial infarction now. -will continue aspirin, Lipitor and metoprolol -When necessary nitroglycerin -Troponin 3  DVT ppx: SQ Lovenox Code Status: Full code Family Communication: None at bed side.  Disposition Plan:  To be determined Consults called:  none Admission status:  Inpatient/tele  Date of Service 11/06/2016    Elmarie Devlin, [facility] Pager [phone]  If 7PM-7AM, please contact night-coverage [email or web address] Password [facility] 11/06/2016, 7:56 PM

## 2016-11-06 NOTE — ED Notes (Signed)
Pt continues to urinate on self and in floor despite his demonstrated ability to walk and use a urinal.

## 2016-11-06 NOTE — ED Notes (Signed)
Mike BradfordKimberly [phone]. Report 1954

## 2016-11-06 NOTE — Clinical Social Work Note (Signed)
Clinical Social Work Assessment  Patient Details  Name: Mike [name] MRN: [id number] Date of Birth: 1960-12-21  Date of referral:  11/06/16               Reason for consult:  Facility Placement, Housing Concerns/Homelessness                Permission sought to share information with:  Facility Art therapist granted to share information::  No  Name::        Agency::     Relationship::     Contact Information:     Housing/Transportation Living arrangements for the past 2 months:  Homeless Source of Information:  Patient Patient Interpreter Needed:  None Criminal Activity/Legal Involvement Pertinent to Current Situation/Hospitalization:    Significant Relationships:  None Lives with:  Self Do you feel safe going back to the place where you live?  No Need for family participation in patient care:  No (Coment)  Care giving concerns:  None listed by pt   Social Worker assessment / plan:  CSW met with pt and confirmed pt's plan to be discharged to shelter with help from the [employer] [place] to live.  CSW provided active listening and validated pt's concerns he is homeless but not disabled.  Pt stated he does not have insurance.  CSW educated pt on how to apply for disability and/or Medicaid and provided pt with a shelter list.  CSW also provided pt with a information sheet on how to obtain an [employer] and how to request assistance from [place] at the [facility] and the social workers at the [facility]  Pt has been homeless, prior to being admitted to [facility].   Employment status:  Unemployed Forensic scientist:  Self Pay (Medicaid Pending) PT Recommendations:  Not assessed at this time Information / Referral to community resources:     Patient/Family's Response to care:  Patient presented as alert and oriented.  Patient agreeable to plan.  Pt states he has no one who is supportive and strongly involved in pt.'s care.   Pt was pleasant and appreciated CSW intervention.    Patient/Family's Understanding of and Emotional Response to Diagnosis, Current Treatment, and Prognosis:  Still assessing   Emotional Assessment Appearance:  Appears stated age Attitude/Demeanor/Rapport:    Affect (typically observed):  Accepting, Adaptable, Calm, Pleasant Orientation:  Oriented to Self, Oriented to Place, Oriented to  Time, Oriented to Situation Alcohol / Substance use:    Psych involvement (Current and /or in the community):     Discharge Needs  Concerns to be addressed:  Homelessness Readmission within the last 30 days:  Yes Current discharge risk:  Homeless Barriers to Discharge:  Continued Medical Work up   [employer], [place] 11/06/2016, 8:19 PM

## 2016-11-06 NOTE — ED Notes (Signed)
Refused to sign out.

## 2016-11-06 NOTE — ED Notes (Signed)
Pt urinated on floor. Redirected. Given call bell and urinal.

## 2016-11-06 NOTE — ED Notes (Signed)
CBG 455. 

## 2016-11-06 NOTE — ED Provider Notes (Signed)
Medical screening examination/treatment/procedure(s) were conducted as a shared visit with non-physician practitioner(s) and myself.  I personally evaluated the patient during the encounter.  56 year old male is here with hyperglycemia and hypertension and sleepiness.  Patient states that he has not build to get his diabetic medication because he does not have money or access to these types of things.  Said he has been sleepier than normal.  Weaker than normal.  On my exam patient is alert but disoriented.  Apparently he urinated on himself earlier and has been saying strange things to the nurses and other providers.  He does seem to be hyperosmolar on his labs and hyperglycemic as well.  Plan will be for for likely admission for the same and then patient likely needs better social support.   EKG Interpretation  Date/Time:  Monday November 06 2016 18:56:50 EDT Ventricular Rate:  67 PR Interval:    QRS Duration: 94 QT Interval:  395 QTC Calculation: 417 R Axis:   28 Text Interpretation:  Sinus rhythm Left ventricular hypertrophy ST elev, probable normal early repol pattern Baseline wander in lead(s) V3 V2 and V3 seem different than yesterday Confirmed by Marily MemosMesner, Amena Dockham [phone] on 11/06/2016 7:07:33 PM         Ryo Klang, Barbara CowerJason, MD 11/06/16 2342

## 2016-11-06 NOTE — ED Notes (Signed)
Pt ambulated with a steady gait out of the ED with security.

## 2016-11-06 NOTE — ED Triage Notes (Signed)
Per EMS: [facility]. Says he's been drinking coke and eating sweets for days. CBG en route was >600.

## 2016-11-06 NOTE — ED Notes (Signed)
This Clinical research associatewriter called 4th floor and spoke with Charge RN to clarify report time. Report time is [id number].

## 2016-11-06 NOTE — ED Provider Notes (Signed)
[facility] DEPT Provider Note   CSN: [id number] Arrival date & time: 11/06/16  1457     History   Chief Complaint No chief complaint on file.   HPI Mike [name] is a 56 y.o. male.  HPI 56 year old African-American male past medical history significant for diabetes, hypertension, homelessness, medication noncompliance presents to the ED with complaints of elevated blood sugar.  Patient is altered on exam.  Recently admitted for DKA.  Patient has been seen several times in the ED for hyperglycemia.  Patient claims of some mild nausea and emesis.  Denies any blood in his emesis.  Denies any associated cough, shortness of breath, chest pain, abdominal pain, urinary symptoms, change in bowel habits.  The patient states that he has not been taking her medications as prescribed because he cannot afford them.  On past review of his admission patient was taken off insulin due to not being able to store the insulin.  He was placed on a pill.  Patient states he has been sleepier than normal.  Has been weaker than normal.  The patient is able to provide little history due to his disorientation.  Pt denies any fever, chill, ha, vision changes, lightheadedness, dizziness, congestion, neck pain, cp, sob, cough, abd pain,, urinary symptoms, change in bowel habits, melena, hematochezia, lower extremity paresthesias.  Past Medical History:  Diagnosis Date  . Anxiety   . Anxiety   . Chronic lower back pain   . Depression   . DKA (diabetic ketoacidoses) ([place]) 07/30/2016  . Hyperlipemia   . Hypertension   . Migraine    "last one was ~ 4 yr ago" (12/23/2014)  . Seizures ([place])    "related to pills for anxiety; if I don't take the pills I'm suppose to take I'll have them" (12/23/2014)  . Type II diabetes mellitus [facility])     Patient Active Problem List   Diagnosis Date Noted  . Adjustment disorder with other symptoms 11/05/2016  . CKD (chronic kidney disease)  10/22/2016  . Uncontrolled type 2 diabetes mellitus with hyperosmolar nonketotic hyperglycemia ([place]) 08/24/2016  . Essential hypertension 08/24/2016  . Acute ischemic stroke ([place])   . Acute renal failure superimposed on stage 2 chronic kidney disease ([place]) 07/31/2016  . Acute metabolic encephalopathy 40/34/7425  . DKA (diabetic ketoacidoses) ([place]) 06/24/2016  . Acute kidney injury (nontraumatic) ([place]) 06/24/2016  . Protein-calorie malnutrition, severe 12/24/2014  . Chest pain, pleuritic 12/23/2014  . Homelessness 12/23/2014  . Hypertensive urgency 12/23/2014  . DM (diabetes mellitus) ([place]) 12/23/2014  . Intermittent palpitations 12/23/2014  . Tobacco abuse 12/23/2014  . Pleuritic chest pain 12/23/2014  . Abnormal EKG 12/23/2014  . Cough   . Type II diabetes mellitus with renal manifestations [facility])     Past Surgical History:  Procedure Laterality Date  . NO PAST SURGERIES         Home Medications    Prior to Admission medications   Medication Sig Start Date End Date Taking? Authorizing Provider  aspirin 81 MG tablet Take 1 tablet (81 mg total) by mouth daily. 11/04/16 12/04/16  Doreatha Lew, MD  atorvastatin (LIPITOR) 40 MG tablet Take 1 tablet (40 mg total) by mouth daily. 11/04/16   Doreatha Lew, MD  blood glucose meter kit and supplies Dispense based on patient and insurance preference. Use up to four times daily as directed. (FOR ICD-9 250.00, 250.01). 10/27/16   Dessa Phi Chahn-Yang, DO  glyBURIDE (DIABETA) 5 MG tablet Take 1 tablet (5 mg  total) by mouth 2 (two) times daily with a meal. 11/05/16   Tanna Furry, MD  metFORMIN (GLUCOPHAGE) 1000 MG tablet Take 1 tablet (1,000 mg total) by mouth 2 (two) times daily. 11/05/16   Tanna Furry, MD  metoprolol tartrate (LOPRESSOR) 50 MG tablet Take 1 tablet (50 mg total) by mouth 2 (two) times daily. 11/04/16   Doreatha Lew, MD    Family History Family History  Problem Relation Age of Onset  . Diabetes  Mellitus II Mother   . Diabetes Mellitus II Father     Social History Social History  Substance Use Topics  . Smoking status: Current Every Day Smoker    Packs/day: 0.10    Years: 40.00    Types: Cigarettes  . Smokeless tobacco: Never Used  . Alcohol use 1.2 oz/week    2 Cans of beer per week     Allergies   Ibuprofen and Tylenol [acetaminophen]   Review of Systems Review of Systems  Constitutional: Negative for chills and fever.  HENT: Negative for congestion and sore throat.   Eyes: Negative for visual disturbance.  Respiratory: Negative for cough and shortness of breath.   Cardiovascular: Negative for chest pain.  Gastrointestinal: Positive for nausea and vomiting. Negative for abdominal pain and diarrhea.  Genitourinary: Negative for dysuria, flank pain, frequency, hematuria, scrotal swelling, testicular pain and urgency.  Musculoskeletal: Negative for arthralgias and myalgias.  Skin: Negative for rash.  Neurological: Negative for dizziness, syncope, weakness, light-headedness, numbness and headaches.  Psychiatric/Behavioral: Negative for sleep disturbance. The patient is not nervous/anxious.      Physical Exam Updated Vital Signs BP (!) 177/79   Pulse 77   Resp 20   SpO2 100%   Physical Exam  Constitutional: He is oriented to person, place, and time. He appears well-developed and well-nourished.  Non-toxic appearance. No distress.  Chronically ill appearing,  HENT:  Head: Normocephalic and atraumatic.  Mouth/Throat: Oropharynx is clear and moist.  Eyes: Pupils are equal, round, and reactive to light. Conjunctivae are normal. Right eye exhibits no discharge. Left eye exhibits no discharge.  Neck: Normal range of motion. Neck supple.  Cardiovascular: Normal rate, regular rhythm, normal heart sounds and intact distal pulses.  Exam reveals no gallop and no friction rub.   No murmur heard. Pulmonary/Chest: Effort normal and breath sounds normal. No respiratory  distress. He has no wheezes. He has no rales. He exhibits no tenderness.  Abdominal: Soft. Bowel sounds are normal. He exhibits no distension. There is no tenderness. There is no rebound and no guarding.  Musculoskeletal: Normal range of motion. He exhibits no tenderness.  Lymphadenopathy:    He has no cervical adenopathy.  Neurological: He is alert and oriented to person, place, and time.  Patient is alert but disoriented.  Skin: Skin is warm and dry. Capillary refill takes less than 2 seconds. No rash noted.  Psychiatric: His behavior is normal. Judgment and thought content normal.  Nursing note and vitals reviewed.    ED Treatments / Results  Labs (all labs ordered are listed, but only abnormal results are displayed) Labs Reviewed  CBC - Abnormal; Notable for the following:       Result Value   RBC 3.53 (*)    Hemoglobin 8.8 (*)    HCT 27.7 (*)    MCH 24.9 (*)    All other components within normal limits  URINALYSIS, ROUTINE W REFLEX MICROSCOPIC - Abnormal; Notable for the following:    Color, Urine COLORLESS (*)  Glucose, UA >=500 (*)    All other components within normal limits  COMPREHENSIVE METABOLIC PANEL - Abnormal; Notable for the following:    Sodium 128 (*)    Chloride 96 (*)    Glucose, Bld 734 (*)    Total Protein 6.3 (*)    Albumin 3.4 (*)    All other components within normal limits  BLOOD GAS, VENOUS - Abnormal; Notable for the following:    pCO2, Ven 41.1 (*)    All other components within normal limits  CBG MONITORING, ED - Abnormal; Notable for the following:    Glucose-Capillary >600 (*)    All other components within normal limits  LIPASE, BLOOD  ETHANOL  CBG MONITORING, ED  I-STAT VENOUS BLOOD GAS, ED    EKG  EKG Interpretation None       Radiology Ct Head Wo Contrast  Result Date: 11/05/2016 CLINICAL DATA:  Elevated glucose. Diabetic ketoacidosis. Seen for same frequently, with discharge from [facility] last night for same. Altered  level of consciousness. EXAM: CT HEAD WITHOUT CONTRAST TECHNIQUE: Contiguous axial images were obtained from the base of the skull through the vertex without intravenous contrast. COMPARISON:  10/22/2016 FINDINGS: Brain: Diffuse cerebral atrophy. Mild ventricular dilatation consistent with central atrophy. Low-attenuation changes in the deep white matter consistent with small vessel ischemia. No evidence of acute infarction, hemorrhage, hydrocephalus, extra-axial collection or mass lesion/mass effect. Vascular: No hyperdense vessel or unexpected calcification. Skull: Normal. Negative for fracture or focal lesion. Sinuses/Orbits: Retention cyst in the right maxillary antrum. Paranasal sinuses and mastoid air cells are otherwise clear. Other: No significant change since previous study. IMPRESSION: No acute intracranial abnormalities. Chronic atrophy and small vessel ischemic changes. Electronically Signed   By: Lucienne Capers M.D.   On: 11/05/2016 01:28   Ct Angio Chest Pe W And/or Wo Contrast  Result Date: 11/05/2016 CLINICAL DATA:  56 y/o M; shortness of breath, PE suspected, high pretest probability. EXAM: CT ANGIOGRAPHY CHEST WITH CONTRAST TECHNIQUE: Multidetector CT imaging of the chest was performed using the standard protocol during bolus administration of intravenous contrast. Multiplanar CT image reconstructions and MIPs were obtained to evaluate the vascular anatomy. CONTRAST:  100 cc Isovue 370 COMPARISON:  None. FINDINGS: Cardiovascular: Satisfactory opacification of the pulmonary arteries to the segmental level. No evidence of pulmonary embolism. Normal heart size. No pericardial effusion. Normal caliber thoracic aorta with mild calcific atherosclerosis. Mediastinum/Nodes: 11 mm nodule within left lower lobe of thyroid. No mediastinal adenopathy. Normal thoracic esophagus. Lungs/Pleura: Trace bilateral pleural effusions. Small clustered nodules and peribronchial thickening in the left lung base  compatible with mild bronchitis/ bronchiolitis. No consolidation. No pneumothorax. Upper Abdomen: No acute abnormality. Musculoskeletal: No chest wall abnormality. No acute or significant osseous findings. Review of the MIP images confirms the above findings. IMPRESSION: 1. No pulmonary embolus identified. 2. Left lower lobe mild bronchitis/bronchiolitis. 3. Trace bilateral pleural effusions. Electronically Signed   By: Kristine Garbe M.D.   On: 11/05/2016 22:50   Dg Chest Port 1 View  Result Date: 11/05/2016 CLINICAL DATA:  Short of breath EXAM: PORTABLE CHEST 1 VIEW COMPARISON:  10/22/2016 FINDINGS: Heart size mildly enlarged. Negative for heart failure. Small left effusion and minimal left lower lobe airspace disease. Right lung remains clear. IMPRESSION: Small area of airspace disease in the left costophrenic angle with a small left effusion. Possible pneumonia or pulmonary embolism. Electronically Signed   By: Franchot Gallo M.D.   On: 11/05/2016 21:14    Procedures Procedures (including critical  care time)  Medications Ordered in ED Medications  insulin regular bolus via infusion 0-10 Units (not administered)  insulin regular (NOVOLIN R,HUMULIN R) 100 Units in sodium chloride 0.9 % 100 mL (1 Units/mL) infusion (not administered)  dextrose 50 % solution 25 mL (not administered)  0.9 %  sodium chloride infusion (not administered)  sodium chloride 0.9 % bolus 1,000 mL (1,000 mLs Intravenous New Bag/Given 11/06/16 1600)  insulin aspart (novoLOG) injection 10 Units (10 Units Subcutaneous Given 11/06/16 1642)     Initial Impression / Assessment and Plan / ED Course  I have reviewed the triage vital signs and the nursing notes.  Pertinent labs & imaging results that were available during my care of the patient were reviewed by me and considered in my medical decision making (see chart for details).     Patient patient presents to the ED with history of diabetes, DKA with recent  admission for DKA for hyperglycemia.  Patient reports nausea and emesis.  Patient has limited BUN to his medical history and history of present illness due to disorientation.  Vital signs are reassuring.  Patient is alert but disoriented.  Abdominal exam is benign.  Lungs clear to auscultation.  Blood glucose is 740.  UA shows no signs of infection and no ketonuria.  Hemoglobin is at baseline.  No leukocytosis.  Corrected sodium is normal.  Anion gap is normal.  Normal pH of 7.4.  Patient osmolality is 302 making him hyperosmolar.  No signs of DKA however concern for HHS given severe hyperglycemia and disorientation.  Patient did urinate on himself in the ED.  Very confused.  Feel that this is complicated by homelessness and poor medical compliance.  Feel the patient would benefit from admission with social work and case manager follow-up.  Sugar has improved with insulin fluid.  Will start patient on insulin drip.  Spoke with Dr. Donna Bernard hospital medicine who agrees to admission will place admission orders.  Patient remained hemodynamic stable at this time.  Seen by my attending who was agreeable with the above plan.  Final Clinical Impressions(s) / ED Diagnoses   Final diagnoses:  Hyperglycemia    New Prescriptions New Prescriptions   No medications on file     Aaron Edelman 11/06/16 1834    Mesner, Corene Cornea, MD 11/06/16 [phone]

## 2016-11-06 NOTE — ED Notes (Addendum)
Patient going to floor after regular insulin drip stopped at 2005

## 2016-11-07 DIAGNOSIS — D649 Anemia, unspecified: Secondary | ICD-10-CM

## 2016-11-07 LAB — GLUCOSE, CAPILLARY
GLUCOSE-CAPILLARY: 252 mg/dL — AB (ref 65–99)
GLUCOSE-CAPILLARY: 262 mg/dL — AB (ref 65–99)
GLUCOSE-CAPILLARY: 224 mg/dL — AB (ref 65–99)
Glucose-Capillary: 455 mg/dL — ABNORMAL HIGH (ref 65–99)
Glucose-Capillary: 141 mg/dL — ABNORMAL HIGH (ref 65–99)

## 2016-11-07 LAB — CBC
HCT: 23.7 % — ABNORMAL LOW (ref 39.0–52.0)
Hemoglobin: 7.6 g/dL — ABNORMAL LOW (ref 13.0–17.0)
MCH: 25.2 pg — AB (ref 26.0–34.0)
MCHC: 32.1 g/dL (ref 30.0–36.0)
MCV: 78.5 fL (ref 78.0–100.0)
PLATELETS: 232 10*3/uL (ref 150–400)
RBC: 3.02 MIL/uL — ABNORMAL LOW (ref 4.22–5.81)
RDW: 13.9 % (ref 11.5–15.5)
WBC: 5 10*3/uL (ref 4.0–10.5)

## 2016-11-07 LAB — BASIC METABOLIC PANEL
Anion gap: 6 (ref 5–15)
BUN: 9 mg/dL (ref 6–20)
CALCIUM: 8.4 mg/dL — AB (ref 8.9–10.3)
CHLORIDE: 107 mmol/L (ref 101–111)
CO2: 25 mmol/L (ref 22–32)
CREATININE: 0.79 mg/dL (ref 0.61–1.24)
GFR calc Af Amer: >60 mL/min (ref >60)
GFR calc non Af Amer: >60 mL/min (ref >60)
Glucose, Bld: 256 mg/dL — ABNORMAL HIGH (ref 65–99)
Potassium: 3.5 mmol/L (ref 3.5–5.1)
SODIUM: 138 mmol/L (ref 135–145)

## 2016-11-07 LAB — TROPONIN I: Troponin I: <0.03 ng/mL (ref <0.03)

## 2016-11-07 LAB — IRON AND TIBC
Iron: 59 ug/dL (ref 45–182)
Saturation Ratios: 31 % (ref 17.9–39.5)
TIBC: 192 ug/dL — ABNORMAL LOW (ref 250–450)
UIBC: 133 ug/dL

## 2016-11-07 LAB — FERRITIN: Ferritin: 225 ng/mL (ref 24–336)

## 2016-11-07 MED ORDER — ADULT MULTIVITAMIN W/MINERALS CH
1.0000 | ORAL_TABLET | Freq: Every day | ORAL | Status: DC
  Administered 2016-11-07 – 2016-11-08 (×2): 1 via ORAL
  Filled 2016-11-07 (×2): qty 1

## 2016-11-07 MED ORDER — PREMIER PROTEIN SHAKE
11.0000 [oz_av] | Freq: Two times a day (BID) | ORAL | Status: DC
  Administered 2016-11-07 – 2016-11-08 (×2): 11 [oz_av] via ORAL
  Filled 2016-11-07 (×3): qty 325.31

## 2016-11-07 MED ORDER — POTASSIUM CHLORIDE CRYS ER 20 MEQ PO TBCR
40.0000 meq | EXTENDED_RELEASE_TABLET | Freq: Once | ORAL | Status: AC
  Administered 2016-11-07: 40 meq via ORAL
  Filled 2016-11-07: qty 2

## 2016-11-07 NOTE — Progress Notes (Signed)
Initial Nutrition Assessment  DOCUMENTATION CODES:   Severe malnutrition in context of social or environmental circumstances  INTERVENTION:    Provide Premier Protein BID, each supplement provides 160kcal and 30g protein.   Provide MVI daily  NUTRITION DIAGNOSIS:   Malnutrition (Severe) related to social / environmental circumstances (homelessness) as evidenced by 12% weight loss in 6 months, severe depletion of body fat, severe depletion of muscle mass.   GOAL:   Patient will meet greater than or equal to 90% of their needs  MONITOR:   PO intake, Supplement acceptance, Weight trends, Labs  REASON FOR ASSESSMENT:   Consult Assessment of nutrition requirement/status  ASSESSMENT:   Pt with PMH significant for medication non-compliance, HTN, HLD, DM, depression, tobacco abuse, CKD II, and homelessness. Presents this admission with uncontrolled type 2 DM with hyperosmolar nonketotic hyperglycemia.   Pt non-verbal upon assessment. Unsure of any unintentional wt loss or appetite changes.   Spoke with RN, who reports pt has great appetite this admission, often asking for more food. Pt has known history of homelessness. Suspect food insecurity. Pt unable to verify. Pt amendable to protein supplementation.  Records show pt has lost 12% of body wt in 6 months. This percentage in this time frame is significant.   Nutrition-Focused physical exam completed. Findings are moderate to severe fat depletion, severe muscle depletion, and no edema.   Medications reviewed and include: SSI, NS @ 125 ml/hr Labs reviewed: CBG 252->600  Diet Order:  Diet heart healthy/carb modified Room service appropriate? Yes; Fluid consistency: Thin  Skin:  Reviewed, no issues  Last BM:  11/06/16  Height:   Ht Readings from Last 1 Encounters:  11/06/16 6\' 3"  (1.905 m)    Weight:   Wt Readings from Last 1 Encounters:  11/06/16 145 lb 11.6 oz (66.1 kg)    Ideal Body Weight:  91.8 kg  BMI:   Body mass index is 18.21 kg/m.  Estimated Nutritional Needs:   Kcal:  2100-2300 kcal (32-35 kcal/kg)  Protein:  115-125 grams (1.7-1.9 g/kg)  Fluid:  >2.1 L/day  EDUCATION NEEDS:   No education needs identified at this time  Vanessa Kickarly Yasmene Salomone RD, LDN Clinical Nutrition Pager # - [phone]

## 2016-11-07 NOTE — Care Management Note (Signed)
Case Management Note  Patient Details  Name: Mike [name] MRN: [id number] Date of Birth: January 14, 1961  Subjective/Objective: Pt is homeless, he use out pt resources. [facility] for medical visits and medications. Pt [facility] Ann [name]  [facility], was used July [facility]. CM will follow for d/c needs.                    Action/Plan:[facility] and [facility] with [facility].     Expected Discharge Date:  11/07/2016               Expected Discharge Plan:     In-House Referral:     Discharge planning Services     Post Acute Care Choice:    Choice offered to:     DME Arranged:    DME Agency:     HH Arranged:    HH Agency:     Status of Service:     If discussed at [employer] Meetings, dates discussed:    Additional CommentsGeni [name]:  Mike Gehlhausen, RN 11/07/2016, 12:05 PM

## 2016-11-07 NOTE — Progress Notes (Addendum)
Inpatient Diabetes Program Recommendations  AACE/ADA: New Consensus Statement on Inpatient Glycemic Control (2015)  Target Ranges:  Prepandial:   less than 140 mg/dL      Peak postprandial:   less than 180 mg/dL (1-2 hours)      Critically ill patients:  140 - 180 mg/dL   Results for Mike [name], Mike [name] (MRN [id number]) as of 11/07/2016 09:39  Ref. Range 11/06/2016 15:06 11/06/2016 18:00 11/06/2016 18:52 11/06/2016 19:59 11/06/2016 20:31 11/07/2016 07:24  Glucose-Capillary Latest Ref Range: 65 - 99 mg/dL >409>600 (HH)  10 units Novolog 455 (H) 354 (H) 275 (H)  10 units Lantus  253 (H) 252 (H)  5 units Novolog    Admit with: Hyperglycemia  History: DM, Homelessness, Noncompliance with meds  Home DM Meds: Metformin 1000 mg BID       Glyburide 5 mg BID  Current Insulin Orders: Lantus 10 units daily      Novolog Sensitive Correction Scale/ SSI (0-9 units) TID AC       MD- Note patient admitted with glucose 734 mg/dl.  Well known to the Inpatient Diabetes Program.  Per Record review, patient has been seen in the ED 13 times since January of 2018.  This is patient's 6th admission since January as well.  Diabetes Coordinator spoke with patient on 10/23/16.  Patient did not engage much in this conversation.  Recently (as of 10/27/16), patient was taken off his insulin and started on oral Diabetes medication regimen (Metformin and Glyburide) due to inability to properly and safely store insulin.  Has homeless issues and does not have a way to consistently store insulin properly.  Not sure if patient has been taking these 2 oral DM meds consistently since d/c on 10/12.    MD- I'm not sure what the plan will be, however, it may be unreasonable to send patient out on insulin since he has homeless issues and may not have the means to safely and properly store his insulin.  Lantus and Novolog will be way too expensive out of pocket.  Has received Rxs for 70/30 insulin before, however, patient  does not take insulin consistently either and has no way to store if appropriately.    Addendum 1:30pm- Attempted to speak with patient earlier this AM.  Patient was not really focused on me during our conversation.  Got sidetracked very easily and kept asking me for more food.  Told me he sleeps on the streets 6 nights a week.  Would probably be best to not discharge on insulin.     --Will follow patient during hospitalization--  Ambrose FinlandJeannine Johnston Deazia Lampi RN, MSN, CDE Diabetes Coordinator Inpatient Glycemic Control Team Team Pager: [phone] (8a-5p)

## 2016-11-07 NOTE — Progress Notes (Signed)
PROGRESS NOTE    Mike [name]  WGN:[id number] DOB: 15-Sep-1960 DOA: 11/06/2016 PCP: [name], NP   Brief Narrative:  56 y.o. male with medical history significant of homeless, medication noncompliance, hypertension, hyperlipidemia, diabetes mellitus, depression, anxiety, seizure, migraine headache, tobacco abuse,   who presents with confusion. In the ER patient was found to have blood sugar level of 734 and admitted for further evaluation.  Assessment & Plan:   # Uncontrolled type 2 diabetes mellitus with hyperosmolar nonketotic hyperglycemia (HCC): -Continue Lantus and NovoLog. Continue IV fluid. Blood sugar level is improving. Continue to monitor closely. Diabetic educator referral. Patient likely go with oral hypoglycemic agent because of finances and home situation. Check A1c level.  # Chronic microcytic anemia: Iron stores acceptable. Drop in hemoglobin today likely due to dilution. Repeat lab in the morning. Patient has no sign of bleeding. Check fecal occult blood test.  #Acute metabolic encephalopathy likely due to hyperosmolar nonketotic hyperglycemia: Patient's mental status has improved and around baseline. He does have chronic encephalopathy and flat affect. I know him from prior admission at [facility].  # Hypertension: Continue hydralazine and metoprolol. Monitor blood pressure closely.  Social worker consult for discharge planning. Continue current supportive care.  Active Problems:   Homelessness   Tobacco abuse   Protein-calorie malnutrition, severe   Acute metabolic encephalopathy   Essential hypertension   Normocytic anemia   HLD (hyperlipidemia)   DVT prophylaxis: Lovenox subcutaneous Code Status: Full code Family Communication: No family at bedside Disposition Plan: Currently admitted    Consultants:   None  Procedures: None Antimicrobials: None  Subjective: Seen and examined at bedside. Denied headache, dizziness, nausea vomiting,  shortness of breath. Review of systems Limited because of his encephalopathy.  Objective: Vitals:   11/06/16 2047 11/07/16 0519 11/07/16 1102 11/07/16 1337  BP: (!) 174/80 (!) 156/87 (!) 169/85 134/72  Pulse: 60 60 68 63  Resp: (!) 22 (!) 21  18  Temp: 98.3 F (36.8 C) 98.6 F (37 C)  98.7 F (37.1 C)  TempSrc: Oral Oral  Oral  SpO2: 100% 100%  100%  Weight: 66.1 kg (145 lb 11.6 oz)     Height: 6\' 3"  (1.905 m)       Intake/Output Summary (Last 24 hours) at 11/07/16 1435 Last data filed at 11/07/16 1338  Gross per 24 hour  Intake             1960 ml  Output             3700 ml  Net            -1740 ml   Filed Weights   11/06/16 2047  Weight: 66.1 kg (145 lb 11.6 oz)    Examination:  General exam: Appears calm and comfortable  Respiratory system: Clear to auscultation. Respiratory effort normal. No wheezing or crackle Cardiovascular system: S1 & S2 heard, RRR.  No pedal edema. Gastrointestinal system: Abdomen is nondistended, soft and nontender. Normal bowel sounds heard. Central nervous system: Alert and oriented. No focal neurological deficits. Skin: No rashes, lesions or ulcers Psychiatry: Flat affect     Data Reviewed: I have personally reviewed following labs and imaging studies  CBC:  Recent Labs Lab 11/02/16 1407  11/04/16 0210 11/05/16 0038 11/05/16 2028 11/06/16 1520 11/07/16 0119  WBC 7.5  < > 8.5 7.5 6.4 5.1 5.0  NEUTROABS 5.4  --   --  4.8 4.4  --   --   HGB 10.6*  < >  9.0* 9.0* 9.5* 8.8* 7.6*  HCT 34.2*  < > 28.0* 27.9* 29.5* 27.7* 23.7*  MCV 80.7  < > 79.3 78.2 79.1 78.5 78.5  PLT 434*  < > 328 318 333 316 232  < > = values in this interval not displayed. Basic Metabolic Panel:  Recent Labs Lab 11/04/16 1214 11/05/16 0048 11/05/16 2028 11/06/16 1520 11/07/16 0119  NA 134* 132* 133* 128* 138  K 3.5 4.3 4.5 4.8 3.5  CL 101 101 99* 96* 107  CO2 25 24 27 23 25   GLUCOSE 261* 484* 533* 734* 256*  BUN 9 17 18 15 9   CREATININE 0.72 0.92  0.96 0.98 0.79  CALCIUM 8.4* 8.7* 9.1 8.9 8.4*  MG 1.6*  --   --   --   --    GFR: Estimated Creatinine Clearance: 98.7 mL/min (by C-G formula based on SCr of 0.79 mg/dL). Liver Function Tests:  Recent Labs Lab 11/03/16 2133 11/05/16 0048 11/05/16 2028 11/06/16 1520  AST 14* 16 20 26   ALT 24 21 21 20   ALKPHOS 88 71 81 93  BILITOT 1.0 0.7 0.5 0.8  PROT 6.3* 5.6* 6.4* 6.3*  ALBUMIN 3.3* 2.9* 3.3* 3.4*    Recent Labs Lab 11/06/16 1520  LIPASE 38   No results for input(s): AMMONIA in the last 168 hours. Coagulation Profile: No results for input(s): INR, PROTIME in the last 168 hours. Cardiac Enzymes:  Recent Labs Lab 11/02/16 1407 11/05/16 2106 11/06/16 2031 11/07/16 0119 11/07/16 0713  CKTOTAL 83  --   --   --   --   TROPONINI  --  <0.03 <0.03 <0.03 <0.03   BNP (last 3 results) No results for input(s): PROBNP in the last 8760 hours. HbA1C: No results for input(s): HGBA1C in the last 72 hours. CBG:  Recent Labs Lab 11/06/16 1852 11/06/16 1959 11/06/16 2031 11/07/16 0724 11/07/16 1154  GLUCAP 354* 275* 253* 252* 262*   Lipid Profile: No results for input(s): CHOL, HDL, LDLCALC, TRIG, CHOLHDL, LDLDIRECT in the last 72 hours. Thyroid Function Tests: No results for input(s): TSH, T4TOTAL, FREET4, T3FREE, THYROIDAB in the last 72 hours. Anemia Panel:  Recent Labs  11/07/16 0754  FERRITIN 225  TIBC 192*  IRON 59   Sepsis Labs:  Recent Labs Lab 11/02/16 1422  LATICACIDVEN 1.37    Recent Results (from the past 240 hour(s))  MRSA PCR Screening     Status: None   Collection Time: 11/04/16  1:36 AM  Result Value Ref Range Status   MRSA by PCR NEGATIVE NEGATIVE Final    Comment:        The GeneXpert MRSA Assay (FDA approved for NASAL specimens only), is one component of a comprehensive MRSA colonization surveillance program. It is not intended to diagnose MRSA infection nor to guide or monitor treatment for MRSA infections.           Radiology Studies: Ct Angio Chest Pe W And/or Wo Contrast  Result Date: 11/05/2016 CLINICAL DATA:  56 y/o M; shortness of breath, PE suspected, high pretest probability. EXAM: CT ANGIOGRAPHY CHEST WITH CONTRAST TECHNIQUE: Multidetector CT imaging of the chest was performed using the standard protocol during bolus administration of intravenous contrast. Multiplanar CT image reconstructions and MIPs were obtained to evaluate the vascular anatomy. CONTRAST:  100 cc Isovue 370 COMPARISON:  None. FINDINGS: Cardiovascular: Satisfactory opacification of the pulmonary arteries to the segmental level. No evidence of pulmonary embolism. Normal heart size. No pericardial effusion. Normal caliber thoracic aorta with mild  calcific atherosclerosis. Mediastinum/Nodes: 11 mm nodule within left lower lobe of thyroid. No mediastinal adenopathy. Normal thoracic esophagus. Lungs/Pleura: Trace bilateral pleural effusions. Small clustered nodules and peribronchial thickening in the left lung base compatible with mild bronchitis/ bronchiolitis. No consolidation. No pneumothorax. Upper Abdomen: No acute abnormality. Musculoskeletal: No chest wall abnormality. No acute or significant osseous findings. Review of the MIP images confirms the above findings. IMPRESSION: 1. No pulmonary embolus identified. 2. Left lower lobe mild bronchitis/bronchiolitis. 3. Trace bilateral pleural effusions. Electronically Signed   By: Mitzi Hansen M.D.   On: 11/05/2016 22:50   Dg Chest Port 1 View  Result Date: 11/05/2016 CLINICAL DATA:  Short of breath EXAM: PORTABLE CHEST 1 VIEW COMPARISON:  10/22/2016 FINDINGS: Heart size mildly enlarged. Negative for heart failure. Small left effusion and minimal left lower lobe airspace disease. Right lung remains clear. IMPRESSION: Small area of airspace disease in the left costophrenic angle with a small left effusion. Possible pneumonia or pulmonary embolism. Electronically Signed   By:  Marlan Palau M.D.   On: 11/05/2016 21:14        Scheduled Meds: . aspirin  81 mg Oral Daily  . atorvastatin  40 mg Oral Daily  . dextromethorphan-guaiFENesin  1 tablet Oral BID  . enoxaparin (LOVENOX) injection  40 mg Subcutaneous Q24H  . hydrALAZINE  25 mg Oral Q8H  . insulin aspart  0-9 Units Subcutaneous TID WC  . insulin glargine  10 Units Subcutaneous Daily  . metoprolol tartrate  50 mg Oral BID  . multivitamin with minerals  1 tablet Oral Daily  . protein supplement shake  11 oz Oral BID BM   Continuous Infusions: . sodium chloride 125 mL/hr at 11/07/16 1401     LOS: 1 day    Mike [name] Jaynie Collins, MD [facility] Pager [phone]  If 7PM-7AM, please contact night-coverage [email or web address] Password [email or web address] 11/07/2016, 2:35 PM

## 2016-11-07 NOTE — Progress Notes (Signed)
Sp[facility] with Jesse SansMary,RN, [facility] who states pt needs to come see her. [facility].

## 2016-11-08 DIAGNOSIS — E1100 Type 2 diabetes mellitus with hyperosmolarity without nonketotic hyperglycemic-hyperosmolar coma (NKHHC): Secondary | ICD-10-CM

## 2016-11-08 LAB — BASIC METABOLIC PANEL
Anion gap: 7 (ref 5–15)
BUN: 13 mg/dL (ref 6–20)
CHLORIDE: 103 mmol/L (ref 101–111)
CO2: 25 mmol/L (ref 22–32)
CREATININE: 0.73 mg/dL (ref 0.61–1.24)
Calcium: 8.4 mg/dL — ABNORMAL LOW (ref 8.9–10.3)
Glucose, Bld: 242 mg/dL — ABNORMAL HIGH (ref 65–99)
POTASSIUM: 3.8 mmol/L (ref 3.5–5.1)
SODIUM: 135 mmol/L (ref 135–145)

## 2016-11-08 LAB — CBC
HCT: 25.6 % — ABNORMAL LOW (ref 39.0–52.0)
HEMOGLOBIN: 8.2 g/dL — AB (ref 13.0–17.0)
MCH: 25.2 pg — ABNORMAL LOW (ref 26.0–34.0)
MCHC: 32 g/dL (ref 30.0–36.0)
MCV: 78.8 fL (ref 78.0–100.0)
PLATELETS: 232 10*3/uL (ref 150–400)
RBC: 3.25 MIL/uL — ABNORMAL LOW (ref 4.22–5.81)
RDW: 14.2 % (ref 11.5–15.5)
WBC: 5.2 10*3/uL (ref 4.0–10.5)

## 2016-11-08 LAB — HEMOGLOBIN A1C
HEMOGLOBIN A1C: 13 % — AB (ref 4.8–5.6)
Mean Plasma Glucose: 326 mg/dL

## 2016-11-08 LAB — GLUCOSE, CAPILLARY
GLUCOSE-CAPILLARY: 275 mg/dL — AB (ref 65–99)
GLUCOSE-CAPILLARY: 324 mg/dL — AB (ref 65–99)

## 2016-11-08 MED ORDER — METFORMIN HCL 1000 MG PO TABS: 1000.0000 mg | ORAL_TABLET | Freq: Two times a day (BID) | ORAL | 0 refills | Status: DC

## 2016-11-08 MED ORDER — BLOOD GLUCOSE METER KIT: PACK | 0 refills | Status: DC

## 2016-11-08 MED ORDER — METOPROLOL TARTRATE 50 MG PO TABS: 50.0000 mg | ORAL_TABLET | Freq: Two times a day (BID) | ORAL | 0 refills | Status: DC

## 2016-11-08 MED ORDER — GLYBURIDE 5 MG PO TABS: 5.0000 mg | ORAL_TABLET | Freq: Two times a day (BID) | ORAL | 0 refills | Status: DC

## 2016-11-08 MED ORDER — HYDRALAZINE HCL 25 MG PO TABS: 25.0000 mg | ORAL_TABLET | Freq: Three times a day (TID) | ORAL | 0 refills | Status: DC

## 2016-11-08 NOTE — Discharge Summary (Signed)
Physician Discharge Summary  Keishawn Rajewski UUV:[id number] DOB: Jun 30, 1960 DOA: 11/06/2016  PCP: [name], NP  Admit date: 11/06/2016 Discharge date: 11/08/2016  Recommendations for Outpatient Follow-up:  1. Pt will need to follow up with PCP in 1-2 weeks post discharge 2. Please obtain BMP to evaluate electrolytes and kidney function  Discharge Diagnoses:  Principal Problem:   Uncontrolled type 2 diabetes mellitus with hyperosmolar nonketotic hyperglycemia (HCC) Active Problems:   Homelessness  Discharge Condition: Stable  Diet recommendation: Heart healthy diet discussed in details   History of present illness:  56 y.o.malewith medical history significant of homeless, medication noncompliance, hypertension, hyperlipidemia, diabetes mellitus, depression, anxiety, seizure, migraine headache, tobacco abuse,   who presented with confusion. In the ER patient was found to have blood sugar level of 734 and admitted for further evaluation.  Assessment & Plan:   # Uncontrolled type 2 diabetes mellitus with hyperosmolar nonketotic hyperglycemia (Page): - in the setting of medical noncompliance and complicated by homelessness which is interfering with the ability to store insulin properly - recommendation was to discharge patient on oral antihyperglycemic regimen - patient advised to follow closely with primary care provider so that compliance can be established and hopefully patient can be started on insulin soon - A1c is greater than 13 and therefore patient made aware that insulin would be preferred diabetes regimen  # Chronic microcytic anemia: Iron stores acceptable. Drop in hemoglobin today likely due to dilution.  - no evidence of active bleeding  #Acute metabolic encephalopathy likely due to hyperosmolar nonketotic hyperglycemia: - resolved  # Hypertension: - continue metoprolol and hydralazine   DVT prophylaxis: Lovenox SQ Code Status: Full code Family  Communication: No family at bedside Disposition Plan: home  Consultants:   None  Procedures/Studies: Dg Chest 2 View  Result Date: 10/22/2016 CLINICAL DATA:  56 year old male with decreased level consciousness. EXAM: CHEST  2 VIEW COMPARISON:  Chest x-ray 07/28/2016. FINDINGS: Lung volumes are normal. No consolidative airspace disease. No pleural effusions. No pneumothorax. No pulmonary nodule or mass noted. Pulmonary vasculature and the cardiomediastinal silhouette are within normal limits. IMPRESSION: No radiographic evidence of acute cardiopulmonary disease. Electronically Signed   By: Vinnie Langton M.D.   On: 10/22/2016 15:14   Ct Head Wo Contrast  Result Date: 11/05/2016 CLINICAL DATA:  Elevated glucose. Diabetic ketoacidosis. Seen for same frequently, with discharge from [facility] last night for same. Altered level of consciousness. EXAM: CT HEAD WITHOUT CONTRAST TECHNIQUE: Contiguous axial images were obtained from the base of the skull through the vertex without intravenous contrast. COMPARISON:  10/22/2016 FINDINGS: Brain: Diffuse cerebral atrophy. Mild ventricular dilatation consistent with central atrophy. Low-attenuation changes in the deep white matter consistent with small vessel ischemia. No evidence of acute infarction, hemorrhage, hydrocephalus, extra-axial collection or mass lesion/mass effect. Vascular: No hyperdense vessel or unexpected calcification. Skull: Normal. Negative for fracture or focal lesion. Sinuses/Orbits: Retention cyst in the right maxillary antrum. Paranasal sinuses and mastoid air cells are otherwise clear. Other: No significant change since previous study. IMPRESSION: No acute intracranial abnormalities. Chronic atrophy and small vessel ischemic changes. Electronically Signed   By: Lucienne Capers M.D.   On: 11/05/2016 01:28   Ct Head Wo Contrast  Result Date: 10/22/2016 CLINICAL DATA:  Patient's presentation today consistent with hyperglycemia and DKA  EXAM: CT HEAD WITHOUT CONTRAST TECHNIQUE: Contiguous axial images were obtained from the base of the skull through the vertex without intravenous contrast. COMPARISON:  None. FINDINGS: Brain: No evidence of acute infarction, hemorrhage,  hydrocephalus, extra-axial collection or mass lesion/mass effect. Generalized cerebral atrophy. Periventricular white matter low attenuation likely secondary to microangiopathy. Vascular: No hyperdense vessel or unexpected calcification. Skull: No osseous abnormality. Sinuses/Orbits: Visualized paranasal sinuses are clear. Visualized mastoid sinuses are clear. Visualized orbits demonstrate no focal abnormality. Other: None IMPRESSION: 1. No acute intracranial pathology. 2. Chronic microvascular disease and cerebral atrophy. Electronically Signed   By: Kathreen Devoid   On: 10/22/2016 14:50   Ct Angio Chest Pe W And/or Wo Contrast  Result Date: 11/05/2016 CLINICAL DATA:  56 y/o M; shortness of breath, PE suspected, high pretest probability. EXAM: CT ANGIOGRAPHY CHEST WITH CONTRAST TECHNIQUE: Multidetector CT imaging of the chest was performed using the standard protocol during bolus administration of intravenous contrast. Multiplanar CT image reconstructions and MIPs were obtained to evaluate the vascular anatomy. CONTRAST:  100 cc Isovue 370 COMPARISON:  None. FINDINGS: Cardiovascular: Satisfactory opacification of the pulmonary arteries to the segmental level. No evidence of pulmonary embolism. Normal heart size. No pericardial effusion. Normal caliber thoracic aorta with mild calcific atherosclerosis. Mediastinum/Nodes: 11 mm nodule within left lower lobe of thyroid. No mediastinal adenopathy. Normal thoracic esophagus. Lungs/Pleura: Trace bilateral pleural effusions. Small clustered nodules and peribronchial thickening in the left lung base compatible with mild bronchitis/ bronchiolitis. No consolidation. No pneumothorax. Upper Abdomen: No acute abnormality. Musculoskeletal: No  chest wall abnormality. No acute or significant osseous findings. Review of the MIP images confirms the above findings. IMPRESSION: 1. No pulmonary embolus identified. 2. Left lower lobe mild bronchitis/bronchiolitis. 3. Trace bilateral pleural effusions. Electronically Signed   By: Kristine Garbe M.D.   On: 11/05/2016 22:50   Dg Chest Port 1 View  Result Date: 11/05/2016 CLINICAL DATA:  Short of breath EXAM: PORTABLE CHEST 1 VIEW COMPARISON:  10/22/2016 FINDINGS: Heart size mildly enlarged. Negative for heart failure. Small left effusion and minimal left lower lobe airspace disease. Right lung remains clear. IMPRESSION: Small area of airspace disease in the left costophrenic angle with a small left effusion. Possible pneumonia or pulmonary embolism. Electronically Signed   By: Franchot Gallo M.D.   On: 11/05/2016 21:14   Discharge Exam: Vitals:   11/08/16 0507 11/08/16 0939  BP: (!) 175/83 (!) 167/75  Pulse: 63 62  Resp: 16   Temp: 98.8 F (37.1 C)   SpO2: 97%    Vitals:   11/07/16 1458 11/07/16 2054 11/08/16 0507 11/08/16 0939  BP: (!) 141/78 (!) 147/78 (!) 175/83 (!) 167/75  Pulse:  83 63 62  Resp:  18 16   Temp:  99.2 F (37.3 C) 98.8 F (37.1 C)   TempSrc:  Oral Oral   SpO2:  100% 97%   Weight:      Height:        General: Pt is alert, follows commands appropriately, not in acute distress Cardiovascular: Regular rate and rhythm, S1/S2 +, no murmurs, no rubs, no gallops Respiratory: Clear to auscultation bilaterally, no wheezing, no crackles, no rhonchi Abdominal: Soft, non tender, non distended, bowel sounds +, no guarding  Discharge Instructions  Discharge Instructions    Diet - low sodium heart healthy    Complete by:  As directed    Increase activity slowly    Complete by:  As directed      Allergies as of 11/08/2016      Reactions   Ibuprofen Nausea And Vomiting, Other (See Comments)   Reaction:  Bloating    Tylenol [acetaminophen] Nausea And  Vomiting, Other (See Comments)   Reaction:  Bloating       Medication List    TAKE these medications   aspirin 81 MG tablet Take 1 tablet (81 mg total) by mouth daily.   atorvastatin 40 MG tablet Commonly known as:  LIPITOR Take 1 tablet (40 mg total) by mouth daily.   blood glucose meter kit and supplies Dispense based on patient and insurance preference. Use up to four times daily as directed. (FOR ICD-9 250.00, 250.01).   glyBURIDE 5 MG tablet Commonly known as:  DIABETA Take 1 tablet (5 mg total) by mouth 2 (two) times daily with a meal.   hydrALAZINE 25 MG tablet Commonly known as:  APRESOLINE Take 1 tablet (25 mg total) by mouth every 8 (eight) hours.   metFORMIN 1000 MG tablet Commonly known as:  GLUCOPHAGE Take 1 tablet (1,000 mg total) by mouth 2 (two) times daily.   metoprolol tartrate 50 MG tablet Commonly known as:  LOPRESSOR Take 1 tablet (50 mg total) by mouth 2 (two) times daily.      Follow-up Information    [name], Audrea Muscat, NP. Go to.   Why:  She will follow up with you and help you with medicine. Contact information: [address] [place] [address] [phone]          The results of significant diagnostics from this hospitalization (including imaging, microbiology, ancillary and laboratory) are listed below for reference.    Microbiology: Recent Results (from the past 240 hour(s))  MRSA PCR Screening     Status: None   Collection Time: 11/04/16  1:36 AM  Result Value Ref Range Status   MRSA by PCR NEGATIVE NEGATIVE Final    Comment:        The GeneXpert MRSA Assay (FDA approved for NASAL specimens only), is one component of a comprehensive MRSA colonization surveillance program. It is not intended to diagnose MRSA infection nor to guide or monitor treatment for MRSA infections.     Labs: Basic Metabolic Panel:  Recent Labs Lab 11/04/16 1214 11/05/16 0048 11/05/16 2028 11/06/16 1520 11/07/16 0119 11/08/16 0455   NA 134* 132* 133* 128* 138 135  K 3.5 4.3 4.5 4.8 3.5 3.8  CL 101 101 99* 96* 107 103  CO2 '25 24 27 23 25 25  '$ GLUCOSE 261* 484* 533* 734* 256* 242*  BUN '9 17 18 15 9 13  '$ CREATININE 0.72 0.92 0.96 0.98 0.79 0.73  CALCIUM 8.4* 8.7* 9.1 8.9 8.4* 8.4*  MG 1.6*  --   --   --   --   --    Liver Function Tests:  Recent Labs Lab 11/03/16 2133 11/05/16 0048 11/05/16 2028 11/06/16 1520  AST 14* '16 20 26  '$ ALT '24 21 21 20  '$ ALKPHOS 88 71 81 93  BILITOT 1.0 0.7 0.5 0.8  PROT 6.3* 5.6* 6.4* 6.3*  ALBUMIN 3.3* 2.9* 3.3* 3.4*    Recent Labs Lab 11/06/16 1520  LIPASE 38   CBC:  Recent Labs Lab 11/02/16 1407  11/05/16 0038 11/05/16 2028 11/06/16 1520 11/07/16 0119 11/08/16 0455  WBC 7.5  < > 7.5 6.4 5.1 5.0 5.2  NEUTROABS 5.4  --  4.8 4.4  --   --   --   HGB 10.6*  < > 9.0* 9.5* 8.8* 7.6* 8.2*  HCT 34.2*  < > 27.9* 29.5* 27.7* 23.7* 25.6*  MCV 80.7  < > 78.2 79.1 78.5 78.5 78.8  PLT 434*  < > 318 333 316 232 232  < > =  values in this interval not displayed. Cardiac Enzymes:  Recent Labs Lab 11/02/16 1407 11/05/16 2106 11/06/16 2031 11/07/16 0119 11/07/16 0713  CKTOTAL 83  --   --   --   --   TROPONINI  --  <0.03 <0.03 <0.03 <0.03   CBG:  Recent Labs Lab 11/07/16 0724 11/07/16 1154 11/07/16 1633 11/07/16 2051 11/08/16 0721  GLUCAP 252* 262* 141* 224* 275*   SIGNED: Time coordinating discharge: 60 minutes  Faye Ramsay, MD  [facility] 11/08/2016, 12:19 PM Pager [phone]  If 7PM-7AM, please contact night-coverage [email or web address] Password [email or web address]

## 2016-11-08 NOTE — Progress Notes (Signed)
[facility].

## 2016-11-08 NOTE — Evaluation (Addendum)
Occupational Therapy Evaluation Patient Details Name: Mike [name] MRN: [id number] DOB: 07/13/1960 Today's Date: 11/08/2016    History of Present Illness Pt was admitted with controlled DM.  PMH:  HTN, depression, anxiety, seizure, CKD and DM   Clinical Impression   This 56 year old man was admitted for the above.  He is homeless and ambulates without AD and completes ADLs on his own.  He needs overall min guard assist at this time for balance as he is unsteady. Will follow in acute setting with supervision level goals.    Follow Up Recommendations  Supervision/Assistance - 24 hour    Equipment Recommendations  None recommended by OT    Recommendations for Other Services       Precautions / Restrictions Precautions Precautions: Fall Restrictions Weight Bearing Restrictions: No      Mobility Bed Mobility Overal bed mobility: Modified Independent             General bed mobility comments: extra time  Transfers       Sit to Stand: Min guard         General transfer comment: for safety    Balance                                           ADL either performed or assessed with clinical judgement   ADL Overall ADL's : Needs assistance/impaired Eating/Feeding: Independent   Grooming: Oral care;Supervision/safety;Standing   Upper Body Bathing: Set up;Sitting   Lower Body Bathing: Min guard;Sit to/from stand   Upper Body Dressing : Set up;Sitting   Lower Body Dressing: Min guard;Sit to/from stand   Toilet Transfer: Min guard;Ambulation;BSC   Toileting- Architect and Hygiene: Min guard;Sit to/from stand         General ADL Comments: pt declined AD.  Min guard for safety.  Pt tended to "furniture walk" and was unsteady.  Ambulated to chair after going into bathroom     Vision         Perception     Praxis      Pertinent Vitals/Pain Pain Assessment: No/denies pain     Hand Dominance      Extremity/Trunk Assessment Upper Extremity Assessment Upper Extremity Assessment: Generalized weakness           Communication Communication Communication: No difficulties   Cognition   Behavior During Therapy: WFL for tasks assessed/performed Overall Cognitive Status: No family/caregiver present to determine baseline cognitive functioning                           Safety/Judgement: Decreased awareness of safety   Problem Solving: Slow processing General Comments: slow to respond   General Comments       Exercises     Shoulder Instructions      Home Living Family/patient expects to be discharged to:: Shelter/Homeless                                        Prior Functioning/Environment Level of Independence: Independent        Comments: does not use AD        OT Problem List: Decreased strength;Decreased activity tolerance;Impaired balance (sitting and/or standing);Decreased safety awareness;Decreased knowledge of use of DME or AE  OT Treatment/Interventions: Self-care/ADL training;Therapeutic exercise;Energy conservation;DME and/or AE instruction;Therapeutic activities;Patient/family education;Balance training    OT Goals(Current goals can be found in the care plan section) Acute Rehab OT Goals Patient Stated Goal: not stated OT Goal Formulation: With patient Time For Goal Achievement: 11/15/16 Potential to Achieve Goals: Good ADL Goals Pt Will Transfer to Toilet: with supervision;ambulating;regular height toilet (with AD as needed) Pt Will Perform Toileting - Clothing Manipulation and hygiene: with supervision;sit to/from stand Additional ADL Goal #1: pt will complete adl with set up/supervision, sit to stand  OT Frequency: Min 2X/week   Barriers to D/C:            Co-evaluation              AM-PAC PT "6 Clicks" Daily Activity     Outcome Measure Help from another person eating meals?: None Help from another  person taking care of personal grooming?: A Little Help from another person toileting, which includes using toliet, bedpan, or urinal?: A Little Help from another person bathing (including washing, rinsing, drying)?: A Little Help from another person to put on and taking off regular upper body clothing?: A Little Help from another person to put on and taking off regular lower body clothing?: A Little 6 Click Score: 19   End of Session    Activity Tolerance: Patient tolerated treatment well Patient left: in chair;with call bell/phone within reach;with chair alarm set  OT Visit Diagnosis: Unsteadiness on feet (R26.81)                Time: [id number] OT Time Calculation (min): 21 min Charges:  OT General Charges $OT Visit: 1 Visit OT Evaluation $OT Eval Moderate Complexity: 1 Mod G-Codes:     VallecitoMaryellen Yassmin Binegar, OTR/L [phone] 11/08/2016  Rebbie Lauricella 11/08/2016, 11:05 AM

## 2016-11-08 NOTE — Discharge Instructions (Signed)

## 2016-11-08 NOTE — Progress Notes (Signed)
CSW followed up with patient to discuss resources provided on 11/06/2016 and to see how patient was doing. Patient recalled speaking with previous social worker about housing resources and [facility]. CSW inquired about barriers that may prevent patient from [facility], patient [facility] that [facility]. CSW inquired about what patient does daily prior to 3PM, patient reported nothing. CSW encouraged [facility] to get medical care for his uncontrolled diabetes. CSW inquired about how patient felt about his health condition, patient reported stressed. CSW validated patient's feelings and inquired about coping skills. Patient reported he "just believe in God", CSW positively affirmed patient's coping skills. CSW inquired about any additional resources that patient may need, patient inquired about lunch. CSW informed patient that lunch will be served in approx a couple of hours. CSW inquired about if patient was familiar with local pantries and places to get meals in [place], patient replied yes. CSW inquired about any additional resources patient may need, patient reported none. CSW again encouraged patient to [facility]. [facility] and reported that he still had the resources provided by previous CSW. CSW signing off, no other needs identified.   Celso SickleKimberly Machi Whittaker, [place] Clinical Social Worker [facility] Cell#: [phone]

## 2016-11-09 ENCOUNTER — Encounter (HOSPITAL_COMMUNITY): Payer: Self-pay

## 2016-11-09 ENCOUNTER — Emergency Department (HOSPITAL_COMMUNITY)
Admission: EM | Admit: 2016-11-09 | Discharge: 2016-11-10 | Disposition: A | Discharge status: Home / Self Care | Payer: Self-pay | Attending: Emergency Medicine | Admitting: Emergency Medicine

## 2016-11-09 DIAGNOSIS — R739 Hyperglycemia, unspecified: Secondary | ICD-10-CM

## 2016-11-09 DIAGNOSIS — N189 Chronic kidney disease, unspecified: Secondary | ICD-10-CM | POA: Insufficient documentation

## 2016-11-09 DIAGNOSIS — F17210 Nicotine dependence, cigarettes, uncomplicated: Secondary | ICD-10-CM | POA: Insufficient documentation

## 2016-11-09 DIAGNOSIS — E1165 Type 2 diabetes mellitus with hyperglycemia: Secondary | ICD-10-CM | POA: Insufficient documentation

## 2016-11-09 DIAGNOSIS — I129 Hypertensive chronic kidney disease with stage 1 through stage 4 chronic kidney disease, or unspecified chronic kidney disease: Secondary | ICD-10-CM | POA: Insufficient documentation

## 2016-11-09 DIAGNOSIS — Z590 Homelessness: Secondary | ICD-10-CM | POA: Insufficient documentation

## 2016-11-09 LAB — URINALYSIS, ROUTINE W REFLEX MICROSCOPIC
BACTERIA UA: NONE SEEN
Bilirubin Urine: NEGATIVE
Glucose, UA: >=500 mg/dL — AB
Hgb urine dipstick: NEGATIVE
Ketones, ur: NEGATIVE mg/dL
Leukocytes, UA: NEGATIVE
Nitrite: NEGATIVE
Protein, ur: NEGATIVE mg/dL
SPECIFIC GRAVITY, URINE: 1.028 (ref 1.005–1.030)
pH: 5 (ref 5.0–8.0)

## 2016-11-09 LAB — BASIC METABOLIC PANEL
ANION GAP: 11 (ref 5–15)
BUN: 12 mg/dL (ref 6–20)
CALCIUM: 9.2 mg/dL (ref 8.9–10.3)
CO2: 26 mmol/L (ref 22–32)
Chloride: 94 mmol/L — ABNORMAL LOW (ref 101–111)
Creatinine, Ser: 0.94 mg/dL (ref 0.61–1.24)
GFR calc Af Amer: >60 mL/min (ref >60)
GFR calc non Af Amer: >60 mL/min (ref >60)
GLUCOSE: 628 mg/dL — AB (ref 65–99)
Potassium: 3.9 mmol/L (ref 3.5–5.1)
Sodium: 131 mmol/L — ABNORMAL LOW (ref 135–145)

## 2016-11-09 LAB — CBC
HCT: 27.7 % — ABNORMAL LOW (ref 39.0–52.0)
HEMOGLOBIN: 8.8 g/dL — AB (ref 13.0–17.0)
MCH: 25.1 pg — AB (ref 26.0–34.0)
MCHC: 31.8 g/dL (ref 30.0–36.0)
MCV: 78.9 fL (ref 78.0–100.0)
Platelets: 259 10*3/uL (ref 150–400)
RBC: 3.51 MIL/uL — ABNORMAL LOW (ref 4.22–5.81)
RDW: 14.3 % (ref 11.5–15.5)
WBC: 5.3 10*3/uL (ref 4.0–10.5)

## 2016-11-09 LAB — CBG MONITORING, ED: GLUCOSE-CAPILLARY: 417 mg/dL — AB (ref 65–99)

## 2016-11-09 MED ORDER — SODIUM CHLORIDE 0.9 % IV BOLUS (SEPSIS)
1000.0000 mL | Freq: Once | INTRAVENOUS | Status: AC
  Administered 2016-11-09: 1000 mL via INTRAVENOUS

## 2016-11-09 MED ORDER — SODIUM CHLORIDE 0.9 % IV SOLN
INTRAVENOUS | Status: DC
  Administered 2016-11-09: 5.4 [IU]/h via INTRAVENOUS
  Filled 2016-11-09: qty 1

## 2016-11-09 NOTE — ED Provider Notes (Signed)
[facility] EMERGENCY DEPARTMENT Provider Note   CSN: [id number] Arrival date & time: 11/09/16  2050     History   Chief Complaint Chief Complaint  Patient presents with  . Hyperglycemia    HPI Mike [name] is a 56 y.o. male.  HPI  The patient is a 56 year old male, he has a history of chronic depression, chronic pain, anxiety and a history of seizures as well as type 2 diabetes and hypertension.  The patient states that he is homeless, he has been to the hospital several times over the last week for hyperglycemia and freely admits that he is not taking his medications.  He reports that this evening he was feeling off, he is not more specific about that, he took his blood sugar and it was too high to read.    Past Medical History:  Diagnosis Date  . Anxiety   . Anxiety   . Chronic lower back pain   . Depression   . DKA (diabetic ketoacidoses) ([place]) 07/30/2016  . Hyperlipemia   . Hypertension   . Migraine    "last one was ~ 4 yr ago" (12/23/2014)  . Seizures ([place])    "related to pills for anxiety; if I don't take the pills I'm suppose to take I'll have them" (12/23/2014)  . Type II diabetes mellitus [facility])     Patient Active Problem List   Diagnosis Date Noted  . Type 2 diabetes mellitus with hyperosmolarity without nonketotic hyperglycemic-hyperosmolar coma [facility]) ([place]) 11/06/2016  . Normocytic anemia 11/06/2016  . HLD (hyperlipidemia) 11/06/2016  . Adjustment disorder with other symptoms 11/05/2016  . CKD (chronic kidney disease) 10/22/2016  . Uncontrolled type 2 diabetes mellitus with hyperosmolar nonketotic hyperglycemia ([place]) 08/24/2016  . Essential hypertension 08/24/2016  . Acute ischemic stroke ([place])   . Acute renal failure superimposed on stage 2 chronic kidney disease ([place]) 07/31/2016  . Acute metabolic encephalopathy 40/98/1191  . DKA (diabetic ketoacidoses) ([place]) 06/24/2016  . Acute kidney injury (nontraumatic) ([place]) 06/24/2016  .  Protein-calorie malnutrition, severe 12/24/2014  . Chest pain, pleuritic 12/23/2014  . Homelessness 12/23/2014  . Hypertensive urgency 12/23/2014  . DM (diabetes mellitus) ([place]) 12/23/2014  . Intermittent palpitations 12/23/2014  . Tobacco abuse 12/23/2014  . Pleuritic chest pain 12/23/2014  . Abnormal EKG 12/23/2014  . Cough   . Type II diabetes mellitus with renal manifestations [facility])     Past Surgical History:  Procedure Laterality Date  . NO PAST SURGERIES         Home Medications    Prior to Admission medications   Medication Sig Start Date End Date Taking? Authorizing Provider  aspirin 81 MG tablet Take 1 tablet (81 mg total) by mouth daily. 11/04/16 12/04/16 Yes Doreatha Lew, MD  hydrALAZINE (APRESOLINE) 25 MG tablet Take 1 tablet (25 mg total) by mouth every 8 (eight) hours. 11/08/16  Yes Theodis Blaze, MD  atorvastatin (LIPITOR) 40 MG tablet Take 1 tablet (40 mg total) by mouth daily. Patient not taking: Reported on 11/09/2016 11/04/16   Doreatha Lew, MD  blood glucose meter kit and supplies Dispense based on patient and insurance preference. Use up to four times daily as directed. (FOR ICD-9 250.00, 250.01). Patient not taking: Reported on 11/09/2016 11/08/16   Theodis Blaze, MD  glyBURIDE (DIABETA) 5 MG tablet Take 1 tablet (5 mg total) by mouth 2 (two) times daily with a meal. Patient not taking: Reported on 11/09/2016 11/08/16   Theodis Blaze, MD  metFORMIN (GLUCOPHAGE) 1000 MG tablet Take 1 tablet (1,000 mg total) by mouth 2 (two) times daily. Patient not taking: Reported on 11/09/2016 11/08/16   Theodis Blaze, MD  metoprolol tartrate (LOPRESSOR) 50 MG tablet Take 1 tablet (50 mg total) by mouth 2 (two) times daily. Patient not taking: Reported on 11/09/2016 11/08/16   Theodis Blaze, MD    Family History Family History  Problem Relation Age of Onset  . Diabetes Mellitus II Mother   . Diabetes Mellitus II Father     Social History Social  History  Substance Use Topics  . Smoking status: Current Every Day Smoker    Packs/day: 0.10    Years: 40.00    Types: Cigarettes  . Smokeless tobacco: Never Used  . Alcohol use 1.2 oz/week    2 Cans of beer per week     Allergies   Ibuprofen and Tylenol [acetaminophen]   Review of Systems Review of Systems  All other systems reviewed and are negative.    Physical Exam Updated Vital Signs BP (!) 179/84   Pulse 93   Temp 98.3 F (36.8 C) (Oral)   Resp 14   SpO2 100%   Physical Exam  Constitutional: He appears well-developed and well-nourished. No distress.  HENT:  Head: Normocephalic and atraumatic.  Mouth/Throat: Oropharynx is clear and moist. No oropharyngeal exudate.  Eyes: Pupils are equal, round, and reactive to light. Conjunctivae and EOM are normal. Right eye exhibits no discharge. Left eye exhibits no discharge. No scleral icterus.  Neck: Normal range of motion. Neck supple. No JVD present. No thyromegaly present.  Cardiovascular: Normal rate, regular rhythm, normal heart sounds and intact distal pulses.  Exam reveals no gallop and no friction rub.   No murmur heard. Pulmonary/Chest: Effort normal and breath sounds normal. No respiratory distress. He has no wheezes. He has no rales.  Abdominal: Soft. Bowel sounds are normal. He exhibits no distension and no mass. There is no tenderness.  Musculoskeletal: Normal range of motion. He exhibits no edema or tenderness.  Lymphadenopathy:    He has no cervical adenopathy.  Neurological: He is alert. Coordination normal.  Skin: Skin is warm and dry. No rash noted. No erythema.  Psychiatric: He has a normal mood and affect. His behavior is normal.  Nursing note and vitals reviewed.    ED Treatments / Results  Labs (all labs ordered are listed, but only abnormal results are displayed) Labs Reviewed  BASIC METABOLIC PANEL - Abnormal; Notable for the following:       Result Value   Sodium 131 (*)    Chloride 94  (*)    Glucose, Bld 628 (*)    All other components within normal limits  CBC - Abnormal; Notable for the following:    RBC 3.51 (*)    Hemoglobin 8.8 (*)    HCT 27.7 (*)    MCH 25.1 (*)    All other components within normal limits  URINALYSIS, ROUTINE W REFLEX MICROSCOPIC - Abnormal; Notable for the following:    Color, Urine STRAW (*)    Glucose, UA >=500 (*)    Squamous Epithelial / LPF 0-5 (*)    All other components within normal limits  CBG MONITORING, ED - Abnormal; Notable for the following:    Glucose-Capillary >600 (*)    All other components within normal limits  CBG MONITORING, ED - Abnormal; Notable for the following:    Glucose-Capillary 417 (*)    All other components within normal  limits  CBG MONITORING, ED    Radiology No results found.  Procedures Procedures (including critical care time)  Medications Ordered in ED Medications  insulin regular (NOVOLIN R,HUMULIN R) 100 Units in sodium chloride 0.9 % 100 mL (1 Units/mL) infusion (5.4 Units/hr Intravenous New Bag/Given 11/09/16 2239)  sodium chloride 0.9 % bolus 1,000 mL (1,000 mLs Intravenous New Bag/Given 11/09/16 2200)  sodium chloride 0.9 % bolus 1,000 mL (1,000 mLs Intravenous New Bag/Given 11/09/16 2200)     Initial Impression / Assessment and Plan / ED Course  I have reviewed the triage vital signs and the nursing notes.  Pertinent labs & imaging results that were available during my care of the patient were reviewed by me and considered in my medical decision making (see chart for details).     The pt has elevated CBG - > 600, there is no DKA, the pt has no other symptoms. Started insulin therapy and fluids Signed out to Dr. Randal Buba who will f/u the [facility] and disposition accordingly   Final Clinical Impressions(s) / ED Diagnoses   Final diagnoses:  None    New Prescriptions New Prescriptions   No medications on file     Noemi Chapel, MD 11/09/16 2341

## 2016-11-09 NOTE — ED Triage Notes (Signed)
Pt comes via [facility] for feeling like his CBG was to high, pt states he felt like he couldn't think. CBG high with EMS. Pt was incontinent of urine. Pt states he can not afford his insulin and the last time he took it was when he was here several days ago.

## 2016-11-10 ENCOUNTER — Emergency Department (HOSPITAL_COMMUNITY)
Admission: EM | Admit: 2016-11-10 | Discharge: 2016-11-11 | Disposition: A | Discharge status: Home / Self Care | Payer: Self-pay | Attending: Emergency Medicine | Admitting: Emergency Medicine

## 2016-11-10 ENCOUNTER — Encounter (HOSPITAL_COMMUNITY): Payer: Self-pay | Admitting: *Deleted

## 2016-11-10 DIAGNOSIS — F17210 Nicotine dependence, cigarettes, uncomplicated: Secondary | ICD-10-CM | POA: Insufficient documentation

## 2016-11-10 DIAGNOSIS — I10 Essential (primary) hypertension: Secondary | ICD-10-CM | POA: Insufficient documentation

## 2016-11-10 DIAGNOSIS — R739 Hyperglycemia, unspecified: Secondary | ICD-10-CM

## 2016-11-10 DIAGNOSIS — E1165 Type 2 diabetes mellitus with hyperglycemia: Secondary | ICD-10-CM | POA: Insufficient documentation

## 2016-11-10 LAB — URINALYSIS, ROUTINE W REFLEX MICROSCOPIC
Bacteria, UA: NONE SEEN
Bilirubin Urine: NEGATIVE
Hgb urine dipstick: NEGATIVE
KETONES UR: 5 mg/dL — AB
LEUKOCYTES UA: NEGATIVE
NITRITE: NEGATIVE
PH: 7 (ref 5.0–8.0)
Protein, ur: NEGATIVE mg/dL
RBC / HPF: NONE SEEN RBC/hpf (ref 0–5)
SPECIFIC GRAVITY, URINE: 1.022 (ref 1.005–1.030)

## 2016-11-10 LAB — BASIC METABOLIC PANEL
Anion gap: 9 (ref 5–15)
BUN: 12 mg/dL (ref 6–20)
CO2: 27 mmol/L (ref 22–32)
CREATININE: 0.83 mg/dL (ref 0.61–1.24)
Calcium: 9.2 mg/dL (ref 8.9–10.3)
Chloride: 102 mmol/L (ref 101–111)
GFR calc Af Amer: >60 mL/min (ref >60)
Glucose, Bld: 525 mg/dL (ref 65–99)
Potassium: 4 mmol/L (ref 3.5–5.1)
SODIUM: 138 mmol/L (ref 135–145)

## 2016-11-10 LAB — CBG MONITORING, ED
GLUCOSE-CAPILLARY: 131 mg/dL — AB (ref 65–99)
GLUCOSE-CAPILLARY: 518 mg/dL — AB (ref 65–99)
GLUCOSE-CAPILLARY: 505 mg/dL — AB (ref 65–99)
Glucose-Capillary: 220 mg/dL — ABNORMAL HIGH (ref 65–99)
Glucose-Capillary: 248 mg/dL — ABNORMAL HIGH (ref 65–99)
Glucose-Capillary: 343 mg/dL — ABNORMAL HIGH (ref 65–99)
Glucose-Capillary: 344 mg/dL — ABNORMAL HIGH (ref 65–99)

## 2016-11-10 LAB — CBC
HCT: 25.8 % — ABNORMAL LOW (ref 39.0–52.0)
Hemoglobin: 8.2 g/dL — ABNORMAL LOW (ref 13.0–17.0)
MCH: 25.5 pg — ABNORMAL LOW (ref 26.0–34.0)
MCHC: 31.8 g/dL (ref 30.0–36.0)
MCV: 80.1 fL (ref 78.0–100.0)
PLATELETS: 310 10*3/uL (ref 150–400)
RBC: 3.22 MIL/uL — ABNORMAL LOW (ref 4.22–5.81)
RDW: 15.2 % (ref 11.5–15.5)
WBC: 5.2 10*3/uL (ref 4.0–10.5)

## 2016-11-10 MED ORDER — GLYBURIDE 5 MG PO TABS
5.0000 mg | ORAL_TABLET | Freq: Once | ORAL | Status: AC
  Administered 2016-11-10: 5 mg via ORAL
  Filled 2016-11-10: qty 1

## 2016-11-10 MED ORDER — INSULIN ASPART 100 UNIT/ML ~~LOC~~ SOLN
5.0000 [IU] | Freq: Once | SUBCUTANEOUS | Status: AC
  Administered 2016-11-10: 5 [IU] via SUBCUTANEOUS
  Filled 2016-11-10: qty 1

## 2016-11-10 MED ORDER — SODIUM CHLORIDE 0.9 % IV BOLUS (SEPSIS)
1000.0000 mL | Freq: Once | INTRAVENOUS | Status: AC
  Administered 2016-11-10: 1000 mL via INTRAVENOUS

## 2016-11-10 MED ORDER — METFORMIN HCL 500 MG PO TABS
1000.0000 mg | ORAL_TABLET | Freq: Once | ORAL | Status: AC
  Administered 2016-11-10: 1000 mg via ORAL
  Filled 2016-11-10: qty 2

## 2016-11-10 NOTE — ED Triage Notes (Signed)
Per EMS, pt complains of polyuria, states he feels his blood sugar is high. Pt's CBG read high for EMS. Pt states he can not afford his insulin. Pt was seen for same last night.

## 2016-11-10 NOTE — ED Notes (Signed)
D5 1/2 NS hung at 125 per hour

## 2016-11-10 NOTE — ED Notes (Signed)
Pt ambulatory to lobby.

## 2016-11-10 NOTE — Discharge Instructions (Signed)
Drink plenty of fluids.  Get your diabetic medicines and start taking them tomorrow

## 2016-11-10 NOTE — ED Notes (Signed)
Condom cath placed on patient due to incontinence x 4

## 2016-11-11 ENCOUNTER — Encounter (HOSPITAL_COMMUNITY): Payer: Self-pay | Admitting: Emergency Medicine

## 2016-11-11 ENCOUNTER — Emergency Department (HOSPITAL_COMMUNITY)
Admission: EM | Admit: 2016-11-11 | Discharge: 2016-11-11 | Disposition: A | Discharge status: Home / Self Care | Payer: Self-pay | Attending: Emergency Medicine | Admitting: Emergency Medicine

## 2016-11-11 DIAGNOSIS — N182 Chronic kidney disease, stage 2 (mild): Secondary | ICD-10-CM | POA: Insufficient documentation

## 2016-11-11 DIAGNOSIS — I129 Hypertensive chronic kidney disease with stage 1 through stage 4 chronic kidney disease, or unspecified chronic kidney disease: Secondary | ICD-10-CM | POA: Insufficient documentation

## 2016-11-11 DIAGNOSIS — R739 Hyperglycemia, unspecified: Secondary | ICD-10-CM

## 2016-11-11 DIAGNOSIS — Z794 Long term (current) use of insulin: Secondary | ICD-10-CM | POA: Insufficient documentation

## 2016-11-11 DIAGNOSIS — E119 Type 2 diabetes mellitus without complications: Secondary | ICD-10-CM | POA: Insufficient documentation

## 2016-11-11 DIAGNOSIS — Z7982 Long term (current) use of aspirin: Secondary | ICD-10-CM | POA: Insufficient documentation

## 2016-11-11 LAB — CBC WITH DIFFERENTIAL/PLATELET
Basophils Absolute: 0.1 10*3/uL (ref 0.0–0.1)
Basophils Relative: 1 %
EOS ABS: 0 10*3/uL (ref 0.0–0.7)
Eosinophils Relative: 1 %
HEMATOCRIT: 27.3 % — AB (ref 39.0–52.0)
HEMOGLOBIN: 8.6 g/dL — AB (ref 13.0–17.0)
LYMPHS ABS: 1.3 10*3/uL (ref 0.7–4.0)
Lymphocytes Relative: 25 %
MCH: 25.4 pg — AB (ref 26.0–34.0)
MCHC: 31.5 g/dL (ref 30.0–36.0)
MCV: 80.5 fL (ref 78.0–100.0)
MONO ABS: 0.5 10*3/uL (ref 0.1–1.0)
MONOS PCT: 10 %
NEUTROS ABS: 3.2 10*3/uL (ref 1.7–7.7)
NEUTROS PCT: 63 %
Platelets: 325 10*3/uL (ref 150–400)
RBC: 3.39 MIL/uL — ABNORMAL LOW (ref 4.22–5.81)
RDW: 15.4 % (ref 11.5–15.5)
WBC: 5 10*3/uL (ref 4.0–10.5)

## 2016-11-11 LAB — URINALYSIS, ROUTINE W REFLEX MICROSCOPIC
BILIRUBIN URINE: NEGATIVE
Bacteria, UA: NONE SEEN
Hgb urine dipstick: NEGATIVE
KETONES UR: 5 mg/dL — AB
Leukocytes, UA: NEGATIVE
NITRITE: NEGATIVE
PH: 6 (ref 5.0–8.0)
PROTEIN: NEGATIVE mg/dL
Specific Gravity, Urine: 1.027 (ref 1.005–1.030)

## 2016-11-11 LAB — I-STAT CHEM 8, ED
BUN: 11 mg/dL (ref 6–20)
CALCIUM ION: 1.19 mmol/L (ref 1.15–1.40)
CREATININE: 0.7 mg/dL (ref 0.61–1.24)
Chloride: 97 mmol/L — ABNORMAL LOW (ref 101–111)
GLUCOSE: 667 mg/dL — AB (ref 65–99)
HCT: 30 % — ABNORMAL LOW (ref 39.0–52.0)
Hemoglobin: 10.2 g/dL — ABNORMAL LOW (ref 13.0–17.0)
Potassium: 4.6 mmol/L (ref 3.5–5.1)
Sodium: 133 mmol/L — ABNORMAL LOW (ref 135–145)
TCO2: 27 mmol/L (ref 22–32)

## 2016-11-11 LAB — COMPREHENSIVE METABOLIC PANEL
ALK PHOS: 87 U/L (ref 38–126)
ALT: 23 U/L (ref 17–63)
ANION GAP: 12 (ref 5–15)
AST: 17 U/L (ref 15–41)
Albumin: 3.5 g/dL (ref 3.5–5.0)
BILIRUBIN TOTAL: 0.7 mg/dL (ref 0.3–1.2)
BUN: 12 mg/dL (ref 6–20)
CALCIUM: 9.1 mg/dL (ref 8.9–10.3)
CO2: 25 mmol/L (ref 22–32)
Chloride: 96 mmol/L — ABNORMAL LOW (ref 101–111)
Creatinine, Ser: 0.82 mg/dL (ref 0.61–1.24)
Glucose, Bld: 679 mg/dL (ref 65–99)
Potassium: 4.6 mmol/L (ref 3.5–5.1)
Sodium: 133 mmol/L — ABNORMAL LOW (ref 135–145)
TOTAL PROTEIN: 6.7 g/dL (ref 6.5–8.1)

## 2016-11-11 LAB — CBG MONITORING, ED
GLUCOSE-CAPILLARY: 551 mg/dL — AB (ref 65–99)
Glucose-Capillary: >600 mg/dL (ref 65–99)

## 2016-11-11 MED ORDER — SODIUM CHLORIDE 0.9 % IV BOLUS (SEPSIS)
1000.0000 mL | Freq: Once | INTRAVENOUS | Status: AC
  Administered 2016-11-11: 1000 mL via INTRAVENOUS

## 2016-11-11 NOTE — ED Notes (Signed)
PT DISCHARGED. INSTRUCTIONS GIVEN. AAOX4. PT IN NO APPARENT DISTRESS OR PAIN. THE OPPORTUNITY TO ASK QUESTIONS WAS PROVIDED. 
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
## 2016-11-11 NOTE — ED Notes (Signed)
Bed: WA12 Expected date:  Expected time:  Means of arrival:  Comments: Hold for triage 3 

## 2016-11-11 NOTE — ED Provider Notes (Signed)
[facility] DEPT Provider Note   CSN: [id number] Arrival date & time: 11/10/16  1733     History   Chief Complaint Chief Complaint  Patient presents with  . Hyperglycemia    HPI Mike [name] is a 56 y.o. male.  Patient comes to the emergency department because his blood sugar is elevated and he feels weak.  Patient was just seen yesterday and admitted the day before he has not picked up his diabetic medicines but did say he can get them tomorrow   The history is provided by the patient. No language interpreter was used.  Hyperglycemia  Blood sugar level PTA:  500 Severity:  Moderate Onset quality:  Gradual Timing:  Constant Progression:  Waxing and waning Chronicity:  Recurrent Diabetes status:  Controlled with insulin Context: not change in medication   Associated symptoms: no abdominal pain, no chest pain and no fatigue     Past Medical History:  Diagnosis Date  . Anxiety   . Anxiety   . Chronic lower back pain   . Depression   . DKA (diabetic ketoacidoses) ([place]) 07/30/2016  . Hyperlipemia   . Hypertension   . Migraine    "last one was ~ 4 yr ago" (12/23/2014)  . Seizures ([place])    "related to pills for anxiety; if I don't take the pills I'm suppose to take I'll have them" (12/23/2014)  . Type II diabetes mellitus [facility])     Patient Active Problem List   Diagnosis Date Noted  . Type 2 diabetes mellitus with hyperosmolarity without nonketotic hyperglycemic-hyperosmolar coma [facility]) ([place]) 11/06/2016  . Normocytic anemia 11/06/2016  . HLD (hyperlipidemia) 11/06/2016  . Adjustment disorder with other symptoms 11/05/2016  . CKD (chronic kidney disease) 10/22/2016  . Uncontrolled type 2 diabetes mellitus with hyperosmolar nonketotic hyperglycemia ([place]) 08/24/2016  . Essential hypertension 08/24/2016  . Acute ischemic stroke ([place])   . Acute renal failure superimposed on stage 2 chronic kidney disease ([place]) 07/31/2016  . Acute  metabolic encephalopathy 38/18/2993  . DKA (diabetic ketoacidoses) ([place]) 06/24/2016  . Acute kidney injury (nontraumatic) ([place]) 06/24/2016  . Protein-calorie malnutrition, severe 12/24/2014  . Chest pain, pleuritic 12/23/2014  . Homelessness 12/23/2014  . Hypertensive urgency 12/23/2014  . DM (diabetes mellitus) ([place]) 12/23/2014  . Intermittent palpitations 12/23/2014  . Tobacco abuse 12/23/2014  . Pleuritic chest pain 12/23/2014  . Abnormal EKG 12/23/2014  . Cough   . Type II diabetes mellitus with renal manifestations [facility])     Past Surgical History:  Procedure Laterality Date  . NO PAST SURGERIES         Home Medications    Prior to Admission medications   Medication Sig Start Date End Date Taking? Authorizing Provider  aspirin 81 MG tablet Take 1 tablet (81 mg total) by mouth daily. 11/04/16 12/04/16 Yes Doreatha Lew, MD  hydrALAZINE (APRESOLINE) 25 MG tablet Take 1 tablet (25 mg total) by mouth every 8 (eight) hours. 11/08/16  Yes Theodis Blaze, MD  atorvastatin (LIPITOR) 40 MG tablet Take 1 tablet (40 mg total) by mouth daily. Patient not taking: Reported on 11/09/2016 11/04/16   Doreatha Lew, MD  blood glucose meter kit and supplies Dispense based on patient and insurance preference. Use up to four times daily as directed. (FOR ICD-9 250.00, 250.01). Patient not taking: Reported on 11/09/2016 11/08/16   Theodis Blaze, MD  glyBURIDE (DIABETA) 5 MG tablet Take 1 tablet (5 mg total) by mouth 2 (two) times daily with  a meal. Patient not taking: Reported on 11/09/2016 11/08/16   Theodis Blaze, MD  metFORMIN (GLUCOPHAGE) 1000 MG tablet Take 1 tablet (1,000 mg total) by mouth 2 (two) times daily. Patient not taking: Reported on 11/09/2016 11/08/16   Theodis Blaze, MD    Family History Family History  Problem Relation Age of Onset  . Diabetes Mellitus II Mother   . Diabetes Mellitus II Father     Social History Social History  Substance Use Topics  .  Smoking status: Current Every Day Smoker    Packs/day: 0.10    Years: 40.00    Types: Cigarettes  . Smokeless tobacco: Never Used  . Alcohol use 1.2 oz/week    2 Cans of beer per week     Allergies   Ibuprofen and Tylenol [acetaminophen]   Review of Systems Review of Systems  Constitutional: Negative for appetite change and fatigue.  HENT: Negative for congestion, ear discharge and sinus pressure.   Eyes: Negative for discharge.  Respiratory: Negative for cough.   Cardiovascular: Negative for chest pain.  Gastrointestinal: Negative for abdominal pain and diarrhea.  Genitourinary: Negative for frequency and hematuria.  Musculoskeletal: Negative for back pain.  Skin: Negative for rash.  Neurological: Negative for seizures and headaches.  Psychiatric/Behavioral: Negative for hallucinations.     Physical Exam Updated Vital Signs BP (!) 164/78 (BP Location: Right Arm)   Pulse (!) 59   Temp 98.2 F (36.8 C) (Oral)   Resp 16   Ht '6\' 4"'$  (1.93 m)   Wt 72.6 kg (160 lb)   SpO2 100%   BMI 19.48 kg/m   Physical Exam  Constitutional: He is oriented to person, place, and time. He appears well-developed.  HENT:  Head: Normocephalic.  Mucous membranes dry  Eyes: Conjunctivae and EOM are normal. No scleral icterus.  Neck: Neck supple. No thyromegaly present.  Cardiovascular: Normal rate and regular rhythm.  Exam reveals no gallop and no friction rub.   No murmur heard. Pulmonary/Chest: No stridor. He has no wheezes. He has no rales. He exhibits no tenderness.  Abdominal: He exhibits no distension. There is no tenderness. There is no rebound.  Musculoskeletal: Normal range of motion. He exhibits no edema.  Lymphadenopathy:    He has no cervical adenopathy.  Neurological: He is oriented to person, place, and time. He exhibits normal muscle tone. Coordination normal.  Skin: No rash noted. No erythema.  Psychiatric: He has a normal mood and affect. His behavior is normal.      ED Treatments / Results  Labs (all labs ordered are listed, but only abnormal results are displayed) Labs Reviewed  BASIC METABOLIC PANEL - Abnormal; Notable for the following:       Result Value   Glucose, Bld 525 (*)    All other components within normal limits  CBC - Abnormal; Notable for the following:    RBC 3.22 (*)    Hemoglobin 8.2 (*)    HCT 25.8 (*)    MCH 25.5 (*)    All other components within normal limits  URINALYSIS, ROUTINE W REFLEX MICROSCOPIC - Abnormal; Notable for the following:    Color, Urine STRAW (*)    Glucose, UA >=500 (*)    Ketones, ur 5 (*)    Squamous Epithelial / LPF 0-5 (*)    All other components within normal limits  CBG MONITORING, ED - Abnormal; Notable for the following:    Glucose-Capillary 518 (*)    All other components within  normal limits  CBG MONITORING, ED - Abnormal; Notable for the following:    Glucose-Capillary 505 (*)    All other components within normal limits  CBG MONITORING, ED - Abnormal; Notable for the following:    Glucose-Capillary 344 (*)    All other components within normal limits  CBG MONITORING, ED - Abnormal; Notable for the following:    Glucose-Capillary 248 (*)    All other components within normal limits  CBG MONITORING, ED    EKG  EKG Interpretation None       Radiology No results found.  Procedures Procedures (including critical care time)  Medications Ordered in ED Medications  sodium chloride 0.9 % bolus 1,000 mL (0 mLs Intravenous Stopped 11/10/16 2257)  metFORMIN (GLUCOPHAGE) tablet 1,000 mg (1,000 mg Oral Given 11/10/16 2129)  glyBURIDE (DIABETA) tablet 5 mg (5 mg Oral Given 11/10/16 2129)  insulin aspart (novoLOG) injection 5 Units (5 Units Subcutaneous Given 11/10/16 2128)  sodium chloride 0.9 % bolus 1,000 mL (0 mLs Intravenous Stopped 11/10/16 2257)     Initial Impression / Assessment and Plan / ED Course  I have reviewed the triage vital signs and the nursing  notes.  Pertinent labs & imaging results that were available during my care of the patient were reviewed by me and considered in my medical decision making (see chart for details).    Patient's sugar improved with fluids insulin and oral hypoglycemic medicine.  He assured me he can get his medicines tomorrow   Final Clinical Impressions(s) / ED Diagnoses   Final diagnoses:  Hyperglycemia    New Prescriptions Discharge Medication List as of 11/10/2016 11:51 PM       Milton Ferguson, MD 11/11/16 1342

## 2016-11-11 NOTE — ED Provider Notes (Signed)
[facility] DEPT Provider Note   CSN: [id number] Arrival date & time: 11/11/16  1740     History   Chief Complaint Chief Complaint  Patient presents with  . Hyperglycemia    HPI Mike [name] is a 56 y.o. male.  The history is provided by the patient.  Hyperglycemia  Blood sugar level PTA:  >600 Severity:  Severe Onset quality:  Unable to specify Timing:  Constant Progression:  Unchanged Chronicity:  Chronic Diabetes status:  Controlled with oral medications Current diabetic therapy:  Glyburide/metformin Time since last antidiabetic medication: Today. Context: noncompliance   Context: not change in medication and not recent change in diet   Context comment:  Patient does not follow a diabetic diet.  He eats and drinks whatever he chooses Relieved by:  None tried Ineffective treatments:  None tried Associated symptoms: increased thirst   Associated symptoms: no dehydration, no nausea, no shortness of breath, no vomiting and no weakness   Risk factors: hx of DKA   Risk factors comment:  Patient is homeless.  He often times sleeps outside.  He eats whatever food he can get a hold of.. Patient does admit to drinking some alcohol today.  He also smokes tobacco and marijuana.   Past Medical History:  Diagnosis Date  . Anxiety   . Anxiety   . Chronic lower back pain   . Depression   . DKA (diabetic ketoacidoses) ([place]) 07/30/2016  . Hyperlipemia   . Hypertension   . Migraine    "last one was ~ 4 yr ago" (12/23/2014)  . Seizures ([place])    "related to pills for anxiety; if I don't take the pills I'm suppose to take I'll have them" (12/23/2014)  . Type II diabetes mellitus [facility])     Patient Active Problem List   Diagnosis Date Noted  . Type 2 diabetes mellitus with hyperosmolarity without nonketotic hyperglycemic-hyperosmolar coma [facility]) ([place]) 11/06/2016  . Normocytic anemia 11/06/2016  . HLD (hyperlipidemia) 11/06/2016  . Adjustment  disorder with other symptoms 11/05/2016  . CKD (chronic kidney disease) 10/22/2016  . Uncontrolled type 2 diabetes mellitus with hyperosmolar nonketotic hyperglycemia ([place]) 08/24/2016  . Essential hypertension 08/24/2016  . Acute ischemic stroke ([place])   . Acute renal failure superimposed on stage 2 chronic kidney disease ([place]) 07/31/2016  . Acute metabolic encephalopathy 12/87/8676  . DKA (diabetic ketoacidoses) ([place]) 06/24/2016  . Acute kidney injury (nontraumatic) ([place]) 06/24/2016  . Protein-calorie malnutrition, severe 12/24/2014  . Chest pain, pleuritic 12/23/2014  . Homelessness 12/23/2014  . Hypertensive urgency 12/23/2014  . DM (diabetes mellitus) ([place]) 12/23/2014  . Intermittent palpitations 12/23/2014  . Tobacco abuse 12/23/2014  . Pleuritic chest pain 12/23/2014  . Abnormal EKG 12/23/2014  . Cough   . Type II diabetes mellitus with renal manifestations [facility])     Past Surgical History:  Procedure Laterality Date  . NO PAST SURGERIES         Home Medications    Prior to Admission medications   Medication Sig Start Date End Date Taking? Authorizing Provider  aspirin 81 MG tablet Take 1 tablet (81 mg total) by mouth daily. 11/04/16 12/04/16  Doreatha Lew, MD  atorvastatin (LIPITOR) 40 MG tablet Take 1 tablet (40 mg total) by mouth daily. Patient not taking: Reported on 11/09/2016 11/04/16   Doreatha Lew, MD  blood glucose meter kit and supplies Dispense based on patient and insurance preference. Use up to four times daily as directed. (FOR ICD-9 250.00, 250.01). Patient  not taking: Reported on 11/09/2016 11/08/16   Theodis Blaze, MD  glyBURIDE (DIABETA) 5 MG tablet Take 1 tablet (5 mg total) by mouth 2 (two) times daily with a meal. Patient not taking: Reported on 11/09/2016 11/08/16   Theodis Blaze, MD  hydrALAZINE (APRESOLINE) 25 MG tablet Take 1 tablet (25 mg total) by mouth every 8 (eight) hours. 11/08/16   Theodis Blaze, MD  metFORMIN (GLUCOPHAGE)  1000 MG tablet Take 1 tablet (1,000 mg total) by mouth 2 (two) times daily. Patient not taking: Reported on 11/09/2016 11/08/16   Theodis Blaze, MD    Family History Family History  Problem Relation Age of Onset  . Diabetes Mellitus II Mother   . Diabetes Mellitus II Father     Social History Social History  Substance Use Topics  . Smoking status: Current Every Day Smoker    Packs/day: 0.10    Years: 40.00    Types: Cigarettes  . Smokeless tobacco: Never Used  . Alcohol use 1.2 oz/week    2 Cans of beer per week     Allergies   Ibuprofen and Tylenol [acetaminophen]   Review of Systems Review of Systems  Respiratory: Negative for shortness of breath.   Gastrointestinal: Negative for nausea and vomiting.  Endocrine: Positive for polydipsia.  Neurological: Negative for weakness.  All other systems reviewed and are negative.    Physical Exam Updated Vital Signs BP (!) 176/88 (BP Location: Left Arm)   Pulse (!) 57   Temp 98 F (36.7 C) (Oral)   Resp 18   SpO2 100%   Physical Exam  Constitutional: He is oriented to person, place, and time. He appears well-developed and well-nourished. No distress.  HENT:  Head: Normocephalic and atraumatic.  Mouth/Throat: Oropharynx is clear and moist. Mucous membranes are dry.  Eyes: Pupils are equal, round, and reactive to light. Conjunctivae and EOM are normal.  Neck: Normal range of motion. Neck supple.  Cardiovascular: Normal rate, regular rhythm and intact distal pulses.   No murmur heard. Pulmonary/Chest: Effort normal and breath sounds normal. No respiratory distress. He has no wheezes. He has no rales.  Abdominal: Soft. He exhibits no distension. There is no tenderness. There is no rebound and no guarding.  Musculoskeletal: Normal range of motion. He exhibits no edema or tenderness.  Neurological: He is alert and oriented to person, place, and time.  Skin: Skin is warm and dry. No rash noted. No erythema.  Psychiatric:   Patient is calm and cooperative.  He seems slightly delayed in his response but appropriate  Nursing note and vitals reviewed.    ED Treatments / Results  Labs (all labs ordered are listed, but only abnormal results are displayed) Labs Reviewed  URINALYSIS, ROUTINE W REFLEX MICROSCOPIC - Abnormal; Notable for the following:       Result Value   Color, Urine STRAW (*)    Glucose, UA >=500 (*)    Ketones, ur 5 (*)    Squamous Epithelial / LPF 0-5 (*)    All other components within normal limits  CBC WITH DIFFERENTIAL/PLATELET - Abnormal; Notable for the following:    RBC 3.39 (*)    Hemoglobin 8.6 (*)    HCT 27.3 (*)    MCH 25.4 (*)    All other components within normal limits  COMPREHENSIVE METABOLIC PANEL - Abnormal; Notable for the following:    Sodium 133 (*)    Chloride 96 (*)    Glucose, Bld 679 (*)  All other components within normal limits  BLOOD GAS, VENOUS - Abnormal; Notable for the following:    pO2, Ven 46.8 (*)    Acid-Base Excess 2.5 (*)    All other components within normal limits  CBG MONITORING, ED - Abnormal; Notable for the following:    Glucose-Capillary >600 (*)    All other components within normal limits  I-STAT CHEM 8, ED - Abnormal; Notable for the following:    Sodium 133 (*)    Chloride 97 (*)    Glucose, Bld 667 (*)    Hemoglobin 10.2 (*)    HCT 30.0 (*)    All other components within normal limits  CBG MONITORING, ED - Abnormal; Notable for the following:    Glucose-Capillary 551 (*)    All other components within normal limits  I-STAT VENOUS BLOOD GAS, ED    EKG  EKG Interpretation None       Radiology No results found.  Procedures Procedures (including critical care time)  Medications Ordered in ED Medications  sodium chloride 0.9 % bolus 1,000 mL (not administered)     Initial Impression / Assessment and Plan / ED Course  I have reviewed the triage vital signs and the nursing notes.  Pertinent labs & imaging  results that were available during my care of the patient were reviewed by me and considered in my medical decision making (see chart for details).     Homeless male with a history of type 2 diabetes presenting today with hyperglycemia.  He denies any symptoms suggestive of DKA.  Fingerstick blood sugar here is greater than 600.  Patient states he has not eaten today but did take his diabetic medication.  He does not check his blood sugar regularly.  He has no abdominal pain, vomiting or infectious symptoms at this time.  His blood sugar is most likely elevated the weight is because he does not follow a diabetic diet.  Patient will be given IV fluids.  Repeat blood sugar and potential insulin if his sugar is extremely high.  8:50 PM Labs are reassuring.  After 2 L of normal saline patient's blood sugar is in the 500s.  Again patient's hyperglycemia is related to noncompliance with diet due to his poor living situation.  Patient does have medications which he states he is taking right now.  He would most likely benefit from insulin however this would need to be addressed with his PCP Dr. Synthia Innocent.  At this point patient is stable for discharge.  Final Clinical Impressions(s) / ED Diagnoses   Final diagnoses:  None    New Prescriptions New Prescriptions   No medications on file     Blanchie Dessert, MD 11/11/16 2052

## 2016-11-12 ENCOUNTER — Emergency Department (HOSPITAL_COMMUNITY)
Admission: EM | Admit: 2016-11-12 | Discharge: 2016-11-13 | Disposition: A | Discharge status: Home / Self Care | Payer: Self-pay | Attending: Emergency Medicine | Admitting: Emergency Medicine

## 2016-11-12 DIAGNOSIS — N189 Chronic kidney disease, unspecified: Secondary | ICD-10-CM | POA: Insufficient documentation

## 2016-11-12 DIAGNOSIS — Z9114 Patient's other noncompliance with medication regimen: Secondary | ICD-10-CM | POA: Insufficient documentation

## 2016-11-12 DIAGNOSIS — Z7982 Long term (current) use of aspirin: Secondary | ICD-10-CM | POA: Insufficient documentation

## 2016-11-12 DIAGNOSIS — I129 Hypertensive chronic kidney disease with stage 1 through stage 4 chronic kidney disease, or unspecified chronic kidney disease: Secondary | ICD-10-CM | POA: Insufficient documentation

## 2016-11-12 DIAGNOSIS — F17210 Nicotine dependence, cigarettes, uncomplicated: Secondary | ICD-10-CM | POA: Insufficient documentation

## 2016-11-12 DIAGNOSIS — E1165 Type 2 diabetes mellitus with hyperglycemia: Secondary | ICD-10-CM | POA: Insufficient documentation

## 2016-11-12 DIAGNOSIS — Z590 Homelessness: Secondary | ICD-10-CM | POA: Insufficient documentation

## 2016-11-12 DIAGNOSIS — R739 Hyperglycemia, unspecified: Secondary | ICD-10-CM

## 2016-11-12 LAB — I-STAT CHEM 8, ED
BUN: 14 mg/dL (ref 6–20)
Calcium, Ion: 1.23 mmol/L (ref 1.15–1.40)
Chloride: 97 mmol/L — ABNORMAL LOW (ref 101–111)
Creatinine, Ser: 0.7 mg/dL (ref 0.61–1.24)
Glucose, Bld: >700 mg/dL (ref 65–99)
HEMATOCRIT: 29 % — AB (ref 39.0–52.0)
HEMOGLOBIN: 9.9 g/dL — AB (ref 13.0–17.0)
Potassium: 3.7 mmol/L (ref 3.5–5.1)
SODIUM: 135 mmol/L (ref 135–145)
TCO2: 28 mmol/L (ref 22–32)

## 2016-11-12 LAB — URINALYSIS, ROUTINE W REFLEX MICROSCOPIC
BACTERIA UA: NONE SEEN
BILIRUBIN URINE: NEGATIVE
Hgb urine dipstick: NEGATIVE
Ketones, ur: NEGATIVE mg/dL
LEUKOCYTES UA: NEGATIVE
Nitrite: NEGATIVE
PROTEIN: NEGATIVE mg/dL
SQUAMOUS EPITHELIAL / LPF: NONE SEEN
Specific Gravity, Urine: 1.017 (ref 1.005–1.030)
pH: 7 (ref 5.0–8.0)

## 2016-11-12 LAB — COMPREHENSIVE METABOLIC PANEL
ALBUMIN: 3.8 g/dL (ref 3.5–5.0)
ALT: 26 U/L (ref 17–63)
ANION GAP: 12 (ref 5–15)
AST: 23 U/L (ref 15–41)
Alkaline Phosphatase: 108 U/L (ref 38–126)
BUN: 15 mg/dL (ref 6–20)
CO2: 26 mmol/L (ref 22–32)
Calcium: 9.5 mg/dL (ref 8.9–10.3)
Chloride: 96 mmol/L — ABNORMAL LOW (ref 101–111)
Creatinine, Ser: 0.84 mg/dL (ref 0.61–1.24)
GFR calc Af Amer: >60 mL/min (ref >60)
GFR calc non Af Amer: >60 mL/min (ref >60)
GLUCOSE: 742 mg/dL — AB (ref 65–99)
Potassium: 3.8 mmol/L (ref 3.5–5.1)
SODIUM: 134 mmol/L — AB (ref 135–145)
TOTAL PROTEIN: 7.1 g/dL (ref 6.5–8.1)
Total Bilirubin: 0.6 mg/dL (ref 0.3–1.2)

## 2016-11-12 LAB — CBC WITH DIFFERENTIAL/PLATELET
BASOS PCT: 1 %
Basophils Absolute: 0 10*3/uL (ref 0.0–0.1)
EOS ABS: 0.1 10*3/uL (ref 0.0–0.7)
Eosinophils Relative: 2 %
HCT: 28 % — ABNORMAL LOW (ref 39.0–52.0)
HEMOGLOBIN: 8.9 g/dL — AB (ref 13.0–17.0)
LYMPHS ABS: 1.5 10*3/uL (ref 0.7–4.0)
Lymphocytes Relative: 26 %
MCH: 25.3 pg — ABNORMAL LOW (ref 26.0–34.0)
MCHC: 31.8 g/dL (ref 30.0–36.0)
MCV: 79.5 fL (ref 78.0–100.0)
MONO ABS: 0.7 10*3/uL (ref 0.1–1.0)
Monocytes Relative: 12 %
Neutro Abs: 3.5 10*3/uL (ref 1.7–7.7)
Neutrophils Relative %: 59 %
Platelets: 335 10*3/uL (ref 150–400)
RBC: 3.52 MIL/uL — ABNORMAL LOW (ref 4.22–5.81)
RDW: 15.4 % (ref 11.5–15.5)
WBC: 5.9 10*3/uL (ref 4.0–10.5)

## 2016-11-12 LAB — CBG MONITORING, ED

## 2016-11-12 MED ORDER — INSULIN ASPART 100 UNIT/ML ~~LOC~~ SOLN
10.0000 [IU] | Freq: Once | SUBCUTANEOUS | Status: AC
  Administered 2016-11-12: 10 [IU] via SUBCUTANEOUS
  Filled 2016-11-12: qty 1

## 2016-11-12 MED ORDER — SODIUM CHLORIDE 0.9 % IV BOLUS (SEPSIS)
2000.0000 mL | Freq: Once | INTRAVENOUS | Status: AC
  Administered 2016-11-12: 2000 mL via INTRAVENOUS

## 2016-11-12 NOTE — ED Notes (Signed)
Notified RN,Brian pt. CBG greater than 600 on glucose meter.

## 2016-11-12 NOTE — ED Provider Notes (Signed)
[facility] DEPT Provider Note   CSN: [id number] Arrival date & time: 11/12/16  1725    History   Chief Complaint Chief Complaint  Patient presents with  . Multiple Complaints    HPI Mike [name] is a 56 y.o. male.  56 year old male, well-known to the emergency department, with history of diabetes, hypertension, dyslipidemia, anxiety, and homelessness presents today for complaints of polyuria.  Patient was seen in the emergency department every day for the past 72 hours for similar complaints.  He denies any vomiting or complaints of abdominal pain.  He has not had any known fevers.  He states that he has been compliant with his insulin.  CBG consistently over 600.  Last hospitalized for DKA earlier this month in the setting of medication noncompliance.   The history is provided by the patient. No language interpreter was used.    Past Medical History:  Diagnosis Date  . Anxiety   . Anxiety   . Chronic lower back pain   . Depression   . DKA (diabetic ketoacidoses) ([place]) 07/30/2016  . Hyperlipemia   . Hypertension   . Migraine    "last one was ~ 4 yr ago" (12/23/2014)  . Seizures ([place])    "related to pills for anxiety; if I don't take the pills I'm suppose to take I'll have them" (12/23/2014)  . Type II diabetes mellitus [facility])     Patient Active Problem List   Diagnosis Date Noted  . Type 2 diabetes mellitus with hyperosmolarity without nonketotic hyperglycemic-hyperosmolar coma [facility]) ([place]) 11/06/2016  . Normocytic anemia 11/06/2016  . HLD (hyperlipidemia) 11/06/2016  . Adjustment disorder with other symptoms 11/05/2016  . CKD (chronic kidney disease) 10/22/2016  . Uncontrolled type 2 diabetes mellitus with hyperosmolar nonketotic hyperglycemia ([place]) 08/24/2016  . Essential hypertension 08/24/2016  . Acute ischemic stroke ([place])   . Acute renal failure superimposed on stage 2 chronic kidney disease ([place]) 07/31/2016  . Acute metabolic  encephalopathy 62/26/3335  . DKA (diabetic ketoacidoses) ([place]) 06/24/2016  . Acute kidney injury (nontraumatic) ([place]) 06/24/2016  . Protein-calorie malnutrition, severe 12/24/2014  . Chest pain, pleuritic 12/23/2014  . Homelessness 12/23/2014  . Hypertensive urgency 12/23/2014  . DM (diabetes mellitus) ([place]) 12/23/2014  . Intermittent palpitations 12/23/2014  . Tobacco abuse 12/23/2014  . Pleuritic chest pain 12/23/2014  . Abnormal EKG 12/23/2014  . Cough   . Type II diabetes mellitus with renal manifestations [facility])     Past Surgical History:  Procedure Laterality Date  . NO PAST SURGERIES         Home Medications    Prior to Admission medications   Medication Sig Start Date End Date Taking? Authorizing Provider  aspirin 81 MG tablet Take 1 tablet (81 mg total) by mouth daily. 11/04/16 12/04/16  Doreatha Lew, MD  atorvastatin (LIPITOR) 40 MG tablet Take 1 tablet (40 mg total) by mouth daily. Patient not taking: Reported on 11/09/2016 11/04/16   Doreatha Lew, MD  blood glucose meter kit and supplies Dispense based on patient and insurance preference. Use up to four times daily as directed. (FOR ICD-9 250.00, 250.01). Patient not taking: Reported on 11/09/2016 11/08/16   Theodis Blaze, MD  glyBURIDE (DIABETA) 5 MG tablet Take 1 tablet (5 mg total) by mouth 2 (two) times daily with a meal. Patient not taking: Reported on 11/09/2016 11/08/16   Theodis Blaze, MD  hydrALAZINE (APRESOLINE) 25 MG tablet Take 1 tablet (25 mg total) by mouth every 8 (eight)  hours. 11/08/16   Theodis Blaze, MD  metFORMIN (GLUCOPHAGE) 1000 MG tablet Take 1 tablet (1,000 mg total) by mouth 2 (two) times daily. Patient not taking: Reported on 11/09/2016 11/08/16   Theodis Blaze, MD    Family History Family History  Problem Relation Age of Onset  . Diabetes Mellitus II Mother   . Diabetes Mellitus II Father     Social History Social History  Substance Use Topics  . Smoking status:  Current Every Day Smoker    Packs/day: 0.10    Years: 40.00    Types: Cigarettes  . Smokeless tobacco: Never Used  . Alcohol use 1.2 oz/week    2 Cans of beer per week     Allergies   Ibuprofen and Tylenol [acetaminophen]   Review of Systems Review of Systems Ten systems reviewed and are negative for acute change, except as noted in the HPI.    Physical Exam Updated Vital Signs BP (!) 186/84 (BP Location: Left Arm)   Pulse 89   Temp 97.9 F (36.6 C) (Oral)   Resp 20   SpO2 96%   Physical Exam  Constitutional: He is oriented to person, place, and time. He appears well-developed and well-nourished. No distress.  Nontoxic appearing and in no acute distress. Disheveled. Smells bad.  HENT:  Head: Normocephalic and atraumatic.  Eyes: Conjunctivae and EOM are normal. No scleral icterus.  Neck: Normal range of motion.  Cardiovascular: Normal rate, regular rhythm and intact distal pulses.   Pulmonary/Chest: Effort normal. No respiratory distress. He has no wheezes. He has no rales.  Respirations even and unlabored.  Lungs clear to auscultation bilaterally.  Abdominal: Soft. He exhibits no distension and no mass. There is no tenderness. There is no guarding.  Soft, nontender, nondistended abdomen  Musculoskeletal: Normal range of motion.  Neurological: He is alert and oriented to person, place, and time. He exhibits normal muscle tone. Coordination normal.  GCS 15. Patient ambulatory in the department with steady gait.  Skin: Skin is warm and dry. No rash noted. He is not diaphoretic. No erythema. No pallor.  Psychiatric: He has a normal mood and affect. His behavior is normal.  Nursing note and vitals reviewed.    ED Treatments / Results  Labs (all labs ordered are listed, but only abnormal results are displayed) Labs Reviewed  CBC WITH DIFFERENTIAL/PLATELET - Abnormal; Notable for the following:       Result Value   RBC 3.52 (*)    Hemoglobin 8.9 (*)    HCT 28.0 (*)      MCH 25.3 (*)    All other components within normal limits  COMPREHENSIVE METABOLIC PANEL - Abnormal; Notable for the following:    Sodium 134 (*)    Chloride 96 (*)    Glucose, Bld 742 (*)    All other components within normal limits  URINALYSIS, ROUTINE W REFLEX MICROSCOPIC - Abnormal; Notable for the following:    Color, Urine COLORLESS (*)    Glucose, UA >=500 (*)    All other components within normal limits  CBG MONITORING, ED - Abnormal; Notable for the following:    Glucose-Capillary >600 (*)    All other components within normal limits  I-STAT CHEM 8, ED - Abnormal; Notable for the following:    Chloride 97 (*)    Glucose, Bld >700 (*)    Hemoglobin 9.9 (*)    HCT 29.0 (*)    All other components within normal limits  CBG MONITORING, ED -  Abnormal; Notable for the following:    Glucose-Capillary 501 (*)    All other components within normal limits  CBG MONITORING, ED - Abnormal; Notable for the following:    Glucose-Capillary 338 (*)    All other components within normal limits  BLOOD GAS, VENOUS    EKG  EKG Interpretation None       Radiology No results found.  Procedures Procedures (including critical care time)  Medications Ordered in ED Medications  sodium chloride 0.9 % bolus 2,000 mL (0 mLs Intravenous Stopped 11/13/16 0127)  insulin aspart (novoLOG) injection 10 Units (10 Units Subcutaneous Given 11/12/16 2337)    11:20 PM Patient with CBG >700. IVF and insulin ordered. Anion gap 14.   Initial Impression / Assessment and Plan / ED Course  I have reviewed the triage vital signs and the nursing notes.  Pertinent labs & imaging results that were available during my care of the patient were reviewed by me and considered in my medical decision making (see chart for details).     56 year old male presents to the emergency department for evaluation of polyuria.  Patient has presented every day for the past 4 days for diabetic related complaints.   He is always found to be profoundly hyperglycemic, at times in DKA.  Last hospitalization for DKA was on 11/06/2016.  Patient with sugar of 742 today.  Remainder of labs are at baseline.  Patient with no evidence of DKA today.  He has an anion gap of 12.  No ketonuria.  Hyperglycemia has been managed with 2 L of IV fluids as well as sub-Q insulin.  Sugar has improved to 338.  Patient in no acute distress, afebrile.  Hyperglycemia consistent with medication noncompliance.  I do not believe this is due to infectious etiology.  The patient has been advised to take his medication and to follow-up with his primary care doctor. Return precautions provided. Patient discharged in stable condition with no unaddressed concerns.   Final Clinical Impressions(s) / ED Diagnoses   Final diagnoses:  Hyperglycemia    New Prescriptions New Prescriptions   No medications on file     Antonietta Breach, Hershal Coria 11/13/16 2633    Fredia Sorrow, MD 11/18/16 [phone]

## 2016-11-12 NOTE — ED Triage Notes (Signed)
Pt presents with multiple complaints including "sugar being high, peeing everywhere and not feeling well."

## 2016-11-12 NOTE — ED Notes (Signed)
CHem 8 was given to MD and RN.

## 2016-11-13 ENCOUNTER — Emergency Department (HOSPITAL_COMMUNITY)
Admission: EM | Admit: 2016-11-13 | Discharge: 2016-11-13 | Disposition: A | Discharge status: Home / Self Care | Payer: Self-pay | Attending: Emergency Medicine | Admitting: Emergency Medicine

## 2016-11-13 ENCOUNTER — Encounter (HOSPITAL_COMMUNITY): Payer: Self-pay

## 2016-11-13 DIAGNOSIS — F17210 Nicotine dependence, cigarettes, uncomplicated: Secondary | ICD-10-CM | POA: Insufficient documentation

## 2016-11-13 DIAGNOSIS — Z7982 Long term (current) use of aspirin: Secondary | ICD-10-CM | POA: Insufficient documentation

## 2016-11-13 DIAGNOSIS — R350 Frequency of micturition: Secondary | ICD-10-CM | POA: Insufficient documentation

## 2016-11-13 DIAGNOSIS — Z79899 Other long term (current) drug therapy: Secondary | ICD-10-CM | POA: Insufficient documentation

## 2016-11-13 DIAGNOSIS — I129 Hypertensive chronic kidney disease with stage 1 through stage 4 chronic kidney disease, or unspecified chronic kidney disease: Secondary | ICD-10-CM | POA: Insufficient documentation

## 2016-11-13 DIAGNOSIS — Z7984 Long term (current) use of oral hypoglycemic drugs: Secondary | ICD-10-CM | POA: Insufficient documentation

## 2016-11-13 DIAGNOSIS — N189 Chronic kidney disease, unspecified: Secondary | ICD-10-CM | POA: Insufficient documentation

## 2016-11-13 DIAGNOSIS — R739 Hyperglycemia, unspecified: Secondary | ICD-10-CM

## 2016-11-13 DIAGNOSIS — R631 Polydipsia: Secondary | ICD-10-CM | POA: Insufficient documentation

## 2016-11-13 DIAGNOSIS — E1165 Type 2 diabetes mellitus with hyperglycemia: Secondary | ICD-10-CM | POA: Insufficient documentation

## 2016-11-13 DIAGNOSIS — R632 Polyphagia: Secondary | ICD-10-CM | POA: Insufficient documentation

## 2016-11-13 LAB — URINALYSIS, ROUTINE W REFLEX MICROSCOPIC
Bacteria, UA: NONE SEEN
Bilirubin Urine: NEGATIVE
Glucose, UA: >=500 mg/dL — AB
Hgb urine dipstick: NEGATIVE
Ketones, ur: NEGATIVE mg/dL
Leukocytes, UA: NEGATIVE
Nitrite: NEGATIVE
PROTEIN: NEGATIVE mg/dL
SQUAMOUS EPITHELIAL / LPF: NONE SEEN
Specific Gravity, Urine: 1.027 (ref 1.005–1.030)
pH: 6 (ref 5.0–8.0)

## 2016-11-13 LAB — CBG MONITORING, ED
GLUCOSE-CAPILLARY: 338 mg/dL — AB (ref 65–99)
GLUCOSE-CAPILLARY: 501 mg/dL — AB (ref 65–99)
Glucose-Capillary: 392 mg/dL — ABNORMAL HIGH (ref 65–99)

## 2016-11-13 LAB — CBC
HCT: 26 % — ABNORMAL LOW (ref 39.0–52.0)
HEMOGLOBIN: 8.3 g/dL — AB (ref 13.0–17.0)
MCH: 25.4 pg — AB (ref 26.0–34.0)
MCHC: 31.9 g/dL (ref 30.0–36.0)
MCV: 79.5 fL (ref 78.0–100.0)
PLATELETS: 344 10*3/uL (ref 150–400)
RBC: 3.27 MIL/uL — AB (ref 4.22–5.81)
RDW: 15.4 % (ref 11.5–15.5)
WBC: 5.8 10*3/uL (ref 4.0–10.5)

## 2016-11-13 LAB — BASIC METABOLIC PANEL
ANION GAP: 11 (ref 5–15)
BUN: 15 mg/dL (ref 6–20)
CALCIUM: 8.7 mg/dL — AB (ref 8.9–10.3)
CO2: 25 mmol/L (ref 22–32)
CREATININE: 0.88 mg/dL (ref 0.61–1.24)
Chloride: 99 mmol/L — ABNORMAL LOW (ref 101–111)
Glucose, Bld: 739 mg/dL (ref 65–99)
Potassium: 4.1 mmol/L (ref 3.5–5.1)
Sodium: 135 mmol/L (ref 135–145)

## 2016-11-13 LAB — BLOOD GAS, VENOUS
ACID-BASE EXCESS: 2.5 mmol/L — AB (ref 0.0–2.0)
Acid-Base Excess: 1.8 mmol/L (ref 0.0–2.0)
BICARBONATE: 27.8 mmol/L (ref 20.0–28.0)
Bicarbonate: 27 mmol/L (ref 20.0–28.0)
DRAWN BY: 51425
O2 Saturation: 77.7 %
O2 Saturation: 70.5 %
PATIENT TEMPERATURE: 98.6
PATIENT TEMPERATURE: 98.6
PH VEN: 7.362 (ref 7.250–7.430)
pCO2, Ven: 50.3 mmHg (ref 44.0–60.0)
pCO2, Ven: 48.2 mmHg (ref 44.0–60.0)
pH, Ven: 7.367 (ref 7.250–7.430)
pO2, Ven: 46.8 mmHg — ABNORMAL HIGH (ref 32.0–45.0)
pO2, Ven: 41.3 mmHg (ref 32.0–45.0)

## 2016-11-13 MED ORDER — GLYBURIDE 5 MG PO TABS
5.0000 mg | ORAL_TABLET | Freq: Two times a day (BID) | ORAL | Status: DC
  Administered 2016-11-13: 5 mg via ORAL
  Filled 2016-11-13: qty 1

## 2016-11-13 MED ORDER — METFORMIN HCL 500 MG PO TABS
1000.0000 mg | ORAL_TABLET | Freq: Two times a day (BID) | ORAL | Status: DC
  Administered 2016-11-13: 1000 mg via ORAL
  Filled 2016-11-13: qty 2

## 2016-11-13 MED ORDER — SODIUM CHLORIDE 0.9 % IV BOLUS (SEPSIS)
1000.0000 mL | Freq: Once | INTRAVENOUS | Status: AC
  Administered 2016-11-13: 1000 mL via INTRAVENOUS

## 2016-11-13 MED ORDER — INSULIN ASPART 100 UNIT/ML ~~LOC~~ SOLN
20.0000 [IU] | Freq: Once | SUBCUTANEOUS | Status: AC
  Administered 2016-11-13: 20 [IU] via INTRAVENOUS
  Filled 2016-11-13: qty 1

## 2016-11-13 NOTE — ED Notes (Signed)
No respiratory or acute distress noted alert and oriented x 3 call light in reach no reaction to medication noted. 

## 2016-11-13 NOTE — ED Triage Notes (Signed)
Pt outside when called for triage 

## 2016-11-13 NOTE — ED Notes (Signed)
Patient provided with cheese sticks x2, sandwhich, and water

## 2016-11-13 NOTE — Progress Notes (Addendum)
Consult request has been received. CSW attempting to follow up at present time.  3:49 PM  CSW reviewed chart. This Probation officer is familiar with the pt an has staffed the pt's case with another CSW and with a CM familiar with the pt due to repeated visits to the ED.    CSW spoke with the [facility] ED CM who reported she had spoke with Bevely Palmer, the NP at the [facility] at [address] [place], [place] [address]. Bevely Palmer ids expecting the pt's arrival anytime before 3pm onTuesday 11/14/16 to provide the pt with free needed medications for his diabetes, as well as to continue receiving resources for other needs.  CSW will provide pt with address and contact information for the [facility].  CSW will meet with pt and update pt.  EDP updated.  4:48 PM CSW met with pt and provided pt with resources and verbal and written instructions on how to arrive at the [facility] and receive assistance for medical needs.  CSW asked pt if pt understood and pt nodded in the affirmative and began to relieve himself into a urine depository.  CSW again asked pt if there was anything else pt needed and pt shook his head.  RN updated.  Mike [name]. Mike Iams, [name], LCAS, CSI Clinical Social Worker Ph: [phone]

## 2016-11-13 NOTE — ED Notes (Signed)
Discharge instructions reviewed with patient. Patient verbalizes understanding. VSS. Patient provided with cheese sticks, crackers and peanut butter to take with him. Patient continues to repeat "well, where can I stay the night tonight?" This Clinical research associatewriter [facility], but the patient dismissed this suggestion and states "I need a place to stay. Are you gonna give me a bus pass or something?"  This nurse explained that the ED was out of bus passes at this time. The patient then states "well can I be admitted to the hospital?" This nurse explained that the EDMD had cleared him for discharge, meaning he did not need a hospital admission. Patient then states "well, can I just go to another hospital?" This nurse explained to the patient that he did not need another hospital, if he followed up with [name] NP's office tomorrow morning to receive his free medications. The patient mumbled to himself as he ambulated without assistance out of the ED. Patient refused to sign attestation of discharge paperwork but took the paperwork with him.

## 2016-11-13 NOTE — ED Triage Notes (Signed)
Pt asleep in the lobby and told registration he just needed somewhere to lay down, pt has been seen today

## 2016-11-13 NOTE — ED Provider Notes (Signed)
[facility] DEPT Provider Note   CSN: [id number] Arrival date & time: 11/13/16  1427     History   Chief Complaint Chief Complaint  Patient presents with  . Hyperglycemia    HPI Javone Kai is a 56 y.o. male.  Patient is a 56 year old male with a history of hypertension, hyperlipidemia, seizures, diabetes, homelessness and chronic noncompliance presents again for hyperglycemia.  Patient has been seen every day for the last 10 days for the same symptoms.  Patient states that he is taking his metformin or glyburide however it appears he has scripts that are unfilled.  Patient admits that he does not attempt to eat a diabetic diet and says he has not eaten all day.  Patient was last discharged at 145 this morning with a blood sugar of 338 and he was at [employer] outside on the sidewalk stating he felt dizzy and EMS was called at which time they checked his blood sugar and it was greater than 600.  Patient does admit to drinking alcohol intermittently and using drugs.  He denies any chest pain, shortness of breath or abdominal pain.   The history is provided by the patient.  Hyperglycemia  Associated symptoms: increased thirst and polyuria   Associated symptoms: no abdominal pain, no chest pain, no dysuria, no fever, no nausea, no shortness of breath and no vomiting     Past Medical History:  Diagnosis Date  . Anxiety   . Anxiety   . Chronic lower back pain   . Depression   . DKA (diabetic ketoacidoses) ([place]) 07/30/2016  . Hyperlipemia   . Hypertension   . Migraine    "last one was ~ 4 yr ago" (12/23/2014)  . Seizures ([place])    "related to pills for anxiety; if I don't take the pills I'm suppose to take I'll have them" (12/23/2014)  . Type II diabetes mellitus [facility])     Patient Active Problem List   Diagnosis Date Noted  . Type 2 diabetes mellitus with hyperosmolarity without nonketotic hyperglycemic-hyperosmolar coma  [facility]) ([place]) 11/06/2016  . Normocytic anemia 11/06/2016  . HLD (hyperlipidemia) 11/06/2016  . Adjustment disorder with other symptoms 11/05/2016  . CKD (chronic kidney disease) 10/22/2016  . Uncontrolled type 2 diabetes mellitus with hyperosmolar nonketotic hyperglycemia ([place]) 08/24/2016  . Essential hypertension 08/24/2016  . Acute ischemic stroke ([place])   . Acute renal failure superimposed on stage 2 chronic kidney disease ([place]) 07/31/2016  . Acute metabolic encephalopathy 32/35/5732  . DKA (diabetic ketoacidoses) ([place]) 06/24/2016  . Acute kidney injury (nontraumatic) ([place]) 06/24/2016  . Protein-calorie malnutrition, severe 12/24/2014  . Chest pain, pleuritic 12/23/2014  . Homelessness 12/23/2014  . Hypertensive urgency 12/23/2014  . DM (diabetes mellitus) ([place]) 12/23/2014  . Intermittent palpitations 12/23/2014  . Tobacco abuse 12/23/2014  . Pleuritic chest pain 12/23/2014  . Abnormal EKG 12/23/2014  . Cough   . Type II diabetes mellitus with renal manifestations [facility])     Past Surgical History:  Procedure Laterality Date  . NO PAST SURGERIES         Home Medications    Prior to Admission medications   Medication Sig Start Date End Date Taking? Authorizing Provider  aspirin 81 MG tablet Take 1 tablet (81 mg total) by mouth daily. 11/04/16 12/04/16  Doreatha Lew, MD  atorvastatin (LIPITOR) 40 MG tablet Take 1 tablet (40 mg total) by mouth daily. Patient not taking: Reported on 11/09/2016 11/04/16   Doreatha Lew, MD  blood glucose meter kit and supplies Dispense based on patient and insurance preference. Use up to four times daily as directed. (FOR ICD-9 250.00, 250.01). Patient not taking: Reported on 11/09/2016 11/08/16   Theodis Blaze, MD  glyBURIDE (DIABETA) 5 MG tablet Take 1 tablet (5 mg total) by mouth 2 (two) times daily with a meal. Patient not taking: Reported on 11/09/2016 11/08/16   Theodis Blaze, MD  hydrALAZINE (APRESOLINE) 25 MG tablet Take  1 tablet (25 mg total) by mouth every 8 (eight) hours. 11/08/16   Theodis Blaze, MD  metFORMIN (GLUCOPHAGE) 1000 MG tablet Take 1 tablet (1,000 mg total) by mouth 2 (two) times daily. Patient not taking: Reported on 11/09/2016 11/08/16   Theodis Blaze, MD    Family History Family History  Problem Relation Age of Onset  . Diabetes Mellitus II Mother   . Diabetes Mellitus II Father     Social History Social History  Substance Use Topics  . Smoking status: Current Every Day Smoker    Packs/day: 0.10    Years: 40.00    Types: Cigarettes  . Smokeless tobacco: Never Used  . Alcohol use 1.2 oz/week    2 Cans of beer per week     Allergies   Ibuprofen and Tylenol [acetaminophen]   Review of Systems Review of Systems  Constitutional: Negative for fever.  HENT: Negative.  Negative for congestion, ear pain, rhinorrhea and voice change.   Eyes: Negative.  Negative for photophobia and redness.  Respiratory: Negative for shortness of breath and wheezing.   Cardiovascular: Negative.  Negative for chest pain.  Gastrointestinal: Negative for abdominal pain, diarrhea, nausea and vomiting.  Endocrine: Positive for polydipsia, polyphagia and polyuria.  Genitourinary: Negative.  Negative for dysuria, flank pain and hematuria.  Musculoskeletal: Negative for back pain and neck pain.  Skin: Negative for rash.  All other systems reviewed and are negative.    Physical Exam Updated Vital Signs BP (!) 178/84   Pulse 66 Comment: Simultaneous filing. User may not have seen previous data.  Temp 98.9 F (37.2 C) (Oral)   Resp 15   SpO2 99%   Physical Exam  Constitutional: He is oriented to person, place, and time. He appears well-developed and well-nourished. No distress.  Disheveled and poorly kempt  HENT:  Head: Normocephalic and atraumatic.  Mouth/Throat: Oropharynx is clear and moist.  Eyes: Pupils are equal, round, and reactive to light. Conjunctivae and EOM are normal.  Neck:  Normal range of motion. Neck supple.  Cardiovascular: Normal rate, regular rhythm and intact distal pulses.   No murmur heard. Pulmonary/Chest: Effort normal and breath sounds normal. No respiratory distress. He has no wheezes. He has no rales.  Abdominal: Soft. He exhibits no distension. There is no tenderness. There is no rebound and no guarding.  Musculoskeletal: Normal range of motion. He exhibits no edema or tenderness.  Neurological: He is alert and oriented to person, place, and time.  Skin: Skin is warm and dry. No rash noted. No erythema.  Psychiatric: He has a normal mood and affect. His behavior is normal.  Slow to respond but appropriate  Nursing note and vitals reviewed.    ED Treatments / Results  Labs (all labs ordered are listed, but only abnormal results are displayed) Labs Reviewed  BASIC METABOLIC PANEL - Abnormal; Notable for the following:       Result Value   Chloride 99 (*)    Glucose, Bld 739 (*)    Calcium 8.7 (*)  All other components within normal limits  CBC - Abnormal; Notable for the following:    RBC 3.27 (*)    Hemoglobin 8.3 (*)    HCT 26.0 (*)    MCH 25.4 (*)    All other components within normal limits  URINALYSIS, ROUTINE W REFLEX MICROSCOPIC - Abnormal; Notable for the following:    Color, Urine STRAW (*)    Glucose, UA >=500 (*)    All other components within normal limits  CBG MONITORING, ED - Abnormal; Notable for the following:    Glucose-Capillary >600 (*)    All other components within normal limits  CBG MONITORING, ED - Abnormal; Notable for the following:    Glucose-Capillary >600 (*)    All other components within normal limits    EKG  EKG Interpretation None       Radiology No results found.  Procedures Procedures (including critical care time)  Medications Ordered in ED Medications - No data to display   Initial Impression / Assessment and Plan / ED Course  I have reviewed the triage vital signs and the  nursing notes.  Pertinent labs & imaging results that were available during my care of the patient were reviewed by me and considered in my medical decision making (see chart for details).     Patient returns today for recurrent hyperglycemia.  He has been here repeatedly.  Questionable whether patient is actually taking his medication.  He is also drinking alcohol and eating a non-friendly diabetic diet.  Patient does not appear to be in DKA.  Patient was here less than 24 hours ago with the same symptoms and treated with fluids and blood sugar improved to the 300s.  Now it is greater than 600 again.  Discussed with social work and case management.  They will reach out to his mobile team so that they can work with the patient in improving his health care and control his diabetes.  She has all the resources per social work and case management.  4:12 PM BMP shows a sugar of 739.  Pt given his oral meds and IV insulin.  Will recheck.  [facility] is expecting the pt tomorrow.  6:13 PM Sugar improved to 392.  Will d/c home.  Pt is in agreement with the plan Final Clinical Impressions(s) / ED Diagnoses   Final diagnoses:  Hyperglycemia    New Prescriptions New Prescriptions   No medications on file     Blanchie Dessert, MD 11/13/16 1814

## 2016-11-13 NOTE — ED Triage Notes (Addendum)
Patient BIB [facility] with complaints of hyperglycemia. Capillary glucose read at "high" for EMS. [facility] [employer] called EMS when he found the patient laying on the sidewalk. Patient also complains of dizziness, blurred vision, and "not having anything to eat since yesterday." [facility]. 20G PIV placed to patient left wrist by EMS and 500mL NS IV administered per EMS protocol.

## 2016-11-13 NOTE — ED Notes (Signed)
No respiratory or acute distress noted alert and oriented no reaction to medication noted steady gait noted.

## 2016-11-13 NOTE — ED Notes (Signed)
Bed: WA03 Expected date:  Expected time:  Means of arrival:  Comments: Ems hyperglycemia

## 2016-11-13 NOTE — Discharge Planning (Signed)
[facility].  [facility] and is very familiar with process. EDCM [name].  EDCM relayed information to [facility].

## 2016-11-14 ENCOUNTER — Encounter (HOSPITAL_COMMUNITY): Payer: Self-pay | Admitting: Emergency Medicine

## 2016-11-14 ENCOUNTER — Emergency Department (HOSPITAL_COMMUNITY)
Admission: EM | Admit: 2016-11-14 | Discharge: 2016-11-14 | Disposition: A | Discharge status: Home / Self Care | Payer: Self-pay | Attending: Emergency Medicine | Admitting: Emergency Medicine

## 2016-11-14 ENCOUNTER — Encounter (HOSPITAL_COMMUNITY): Payer: Self-pay

## 2016-11-14 ENCOUNTER — Emergency Department (HOSPITAL_COMMUNITY): Admission: EM | Admit: 2016-11-14 | Discharge: 2016-11-14 | Discharge status: Against Medical Advice | Payer: Self-pay

## 2016-11-14 DIAGNOSIS — Z590 Homelessness: Secondary | ICD-10-CM | POA: Insufficient documentation

## 2016-11-14 DIAGNOSIS — Z8673 Personal history of transient ischemic attack (TIA), and cerebral infarction without residual deficits: Secondary | ICD-10-CM | POA: Insufficient documentation

## 2016-11-14 DIAGNOSIS — I129 Hypertensive chronic kidney disease with stage 1 through stage 4 chronic kidney disease, or unspecified chronic kidney disease: Secondary | ICD-10-CM | POA: Insufficient documentation

## 2016-11-14 DIAGNOSIS — Z7982 Long term (current) use of aspirin: Secondary | ICD-10-CM | POA: Insufficient documentation

## 2016-11-14 DIAGNOSIS — F17210 Nicotine dependence, cigarettes, uncomplicated: Secondary | ICD-10-CM | POA: Insufficient documentation

## 2016-11-14 DIAGNOSIS — G8929 Other chronic pain: Secondary | ICD-10-CM | POA: Insufficient documentation

## 2016-11-14 DIAGNOSIS — Z794 Long term (current) use of insulin: Secondary | ICD-10-CM | POA: Insufficient documentation

## 2016-11-14 DIAGNOSIS — E1165 Type 2 diabetes mellitus with hyperglycemia: Secondary | ICD-10-CM | POA: Insufficient documentation

## 2016-11-14 DIAGNOSIS — N189 Chronic kidney disease, unspecified: Secondary | ICD-10-CM | POA: Insufficient documentation

## 2016-11-14 DIAGNOSIS — I10 Essential (primary) hypertension: Secondary | ICD-10-CM

## 2016-11-14 DIAGNOSIS — E1122 Type 2 diabetes mellitus with diabetic chronic kidney disease: Secondary | ICD-10-CM | POA: Insufficient documentation

## 2016-11-14 DIAGNOSIS — R739 Hyperglycemia, unspecified: Secondary | ICD-10-CM

## 2016-11-14 DIAGNOSIS — Z9114 Patient's other noncompliance with medication regimen: Secondary | ICD-10-CM | POA: Insufficient documentation

## 2016-11-14 LAB — URINALYSIS, ROUTINE W REFLEX MICROSCOPIC
BILIRUBIN URINE: NEGATIVE
Bacteria, UA: NONE SEEN
HGB URINE DIPSTICK: NEGATIVE
KETONES UR: 5 mg/dL — AB
LEUKOCYTES UA: NEGATIVE
NITRITE: NEGATIVE
PH: 5 (ref 5.0–8.0)
Protein, ur: NEGATIVE mg/dL
Specific Gravity, Urine: 1.028 (ref 1.005–1.030)
WBC, UA: NONE SEEN WBC/hpf (ref 0–5)

## 2016-11-14 LAB — BASIC METABOLIC PANEL
Anion gap: 12 (ref 5–15)
BUN: 17 mg/dL (ref 6–20)
CO2: 26 mmol/L (ref 22–32)
CREATININE: 1.22 mg/dL (ref 0.61–1.24)
Calcium: 9 mg/dL (ref 8.9–10.3)
Chloride: 95 mmol/L — ABNORMAL LOW (ref 101–111)
Glucose, Bld: 711 mg/dL (ref 65–99)
Potassium: 4.3 mmol/L (ref 3.5–5.1)
SODIUM: 133 mmol/L — AB (ref 135–145)

## 2016-11-14 LAB — CBC
HCT: 26.8 % — ABNORMAL LOW (ref 39.0–52.0)
Hemoglobin: 8.6 g/dL — ABNORMAL LOW (ref 13.0–17.0)
MCH: 25.9 pg — ABNORMAL LOW (ref 26.0–34.0)
MCHC: 32.1 g/dL (ref 30.0–36.0)
MCV: 80.7 fL (ref 78.0–100.0)
PLATELETS: 376 10*3/uL (ref 150–400)
RBC: 3.32 MIL/uL — ABNORMAL LOW (ref 4.22–5.81)
RDW: 15.7 % — AB (ref 11.5–15.5)
WBC: 6.8 10*3/uL (ref 4.0–10.5)

## 2016-11-14 LAB — CBG MONITORING, ED
GLUCOSE-CAPILLARY: 467 mg/dL — AB (ref 65–99)
Glucose-Capillary: >600 mg/dL (ref 65–99)
Glucose-Capillary: 469 mg/dL — ABNORMAL HIGH (ref 65–99)

## 2016-11-14 MED ORDER — SODIUM CHLORIDE 0.9 % IV BOLUS (SEPSIS)
1000.0000 mL | Freq: Once | INTRAVENOUS | Status: AC
  Administered 2016-11-14: 1000 mL via INTRAVENOUS

## 2016-11-14 MED ORDER — INSULIN ASPART 100 UNIT/ML IV SOLN
10.0000 [IU] | Freq: Once | INTRAVENOUS | Status: AC
  Administered 2016-11-14: 10 [IU] via INTRAVENOUS
  Filled 2016-11-14: qty 0.1

## 2016-11-14 MED ORDER — HYDRALAZINE HCL 25 MG PO TABS
25.0000 mg | ORAL_TABLET | Freq: Once | ORAL | Status: AC
  Administered 2016-11-14: 25 mg via ORAL
  Filled 2016-11-14: qty 1

## 2016-11-14 MED ORDER — METFORMIN HCL 500 MG PO TABS
1000.0000 mg | ORAL_TABLET | Freq: Once | ORAL | Status: AC
  Administered 2016-11-14: 1000 mg via ORAL
  Filled 2016-11-14: qty 2

## 2016-11-14 MED ORDER — METFORMIN HCL 1000 MG PO TABS: 1000.0000 mg | ORAL_TABLET | Freq: Two times a day (BID) | ORAL | 0 refills | Status: DC

## 2016-11-14 MED ORDER — GLYBURIDE 5 MG PO TABS: 5.0000 mg | ORAL_TABLET | Freq: Two times a day (BID) | ORAL | 0 refills | Status: DC

## 2016-11-14 MED ORDER — INSULIN ASPART 100 UNIT/ML ~~LOC~~ SOLN
10.0000 [IU] | Freq: Once | SUBCUTANEOUS | Status: AC
  Administered 2016-11-14: 10 [IU] via SUBCUTANEOUS
  Filled 2016-11-14: qty 1

## 2016-11-14 MED ORDER — GLYBURIDE 5 MG PO TABS
5.0000 mg | ORAL_TABLET | Freq: Once | ORAL | Status: AC
  Administered 2016-11-14: 5 mg via ORAL
  Filled 2016-11-14: qty 1

## 2016-11-14 NOTE — ED Triage Notes (Signed)
Pt was here earlier and then left, EMS was called for him on [address] and EMS reports that his CBG was high and he is hypertensive Pt has several prescriptions that he doesn't have filled

## 2016-11-14 NOTE — ED Notes (Signed)
Bed: WLPT1 Expected date:  Expected time:  Means of arrival:  Comments: 

## 2016-11-14 NOTE — Discharge Instructions (Signed)

## 2016-11-14 NOTE — ED Triage Notes (Signed)
Per [facility], [facility] from [employer], c/o hyperglycemia x1 day. CBG read high. Seen yesterday for same. A&Ox3.  BP 200/70-Hx HTN. Denies taking medication. O2 98%

## 2016-11-14 NOTE — ED Provider Notes (Signed)
Emergency Department Provider Note   I have reviewed the triage vital signs and the nursing notes.   HISTORY  Chief Complaint Hyperglycemia   HPI Mike [name] is a 56 y.o. male with PMH of IDDM, DKA, HLD, HTN and seizures and is to the emergency department from [facility] with hyperglycemia.  Patient states that he has his insulin but has not taken it today.  Patient states he intended to take it but did not.  Patient was seen in the emergency department yesterday with hyperglycemia.  He was discharged after blood sugar control in the ED. denies fevers or chills.  Denies alcohol or drug use.  No nausea, vomiting, diarrhea.   Past Medical History:  Diagnosis Date  . Anxiety   . Anxiety   . Chronic lower back pain   . Depression   . DKA (diabetic ketoacidoses) ([facility]) 07/30/2016  . Hyperlipemia   . Hypertension   . Migraine    "last one was ~ 4 yr ago" (12/23/2014)  . Seizures ([facility])    "related to pills for anxiety; if I don't take the pills I'm suppose to take I'll have them" (12/23/2014)  . Type II diabetes mellitus [facility])     Patient Active Problem List   Diagnosis Date Noted  . Type 2 diabetes mellitus with hyperosmolarity without nonketotic hyperglycemic-hyperosmolar coma [facility]) ([facility]) 11/06/2016  . Normocytic anemia 11/06/2016  . HLD (hyperlipidemia) 11/06/2016  . Adjustment disorder with other symptoms 11/05/2016  . CKD (chronic kidney disease) 10/22/2016  . Uncontrolled type 2 diabetes mellitus with hyperosmolar nonketotic hyperglycemia ([facility]) 08/24/2016  . Essential hypertension 08/24/2016  . Acute ischemic stroke ([facility])   . Acute renal failure superimposed on stage 2 chronic kidney disease ([facility]) 07/31/2016  . Acute metabolic encephalopathy 07/31/2016  . DKA (diabetic ketoacidoses) ([facility]) 06/24/2016  . Acute kidney injury (nontraumatic) ([facility]) 06/24/2016  . Protein-calorie malnutrition, severe 12/24/2014  . Chest pain, pleuritic 12/23/2014  . Homelessness  12/23/2014  . Hypertensive urgency 12/23/2014  . DM (diabetes mellitus) ([facility]) 12/23/2014  . Intermittent palpitations 12/23/2014  . Tobacco abuse 12/23/2014  . Pleuritic chest pain 12/23/2014  . Abnormal EKG 12/23/2014  . Cough   . Type II diabetes mellitus with renal manifestations [facility])     Past Surgical History:  Procedure Laterality Date  . NO PAST SURGERIES      Current Outpatient Rx  . Order #: [id number] Class: Print  . Order #: [id number] Class: Print  . Order #: [id number] Class: Print  . Order #: [id number] Class: Print  . Order #: [id number] Class: Print  . Order #: [id number] Class: Print    Allergies Ibuprofen and Tylenol [acetaminophen]  Family History  Problem Relation Age of Onset  . Diabetes Mellitus II Mother   . Diabetes Mellitus II Father     Social History Social History  Substance Use Topics  . Smoking status: Current Every Day Smoker    Packs/day: 0.10    Years: 40.00    Types: Cigarettes  . Smokeless tobacco: Never Used  . Alcohol use 1.2 oz/week    2 Cans of beer per week    Review of Systems  Constitutional: No fever/chills. Positive elevated blood sugar.  Eyes: No visual changes. ENT: No sore throat. Cardiovascular: Denies chest pain. Respiratory: Denies shortness of breath. Gastrointestinal: No abdominal pain.  No nausea, no vomiting.  No diarrhea.  No constipation. Genitourinary: Negative for dysuria. Musculoskeletal: Negative for back pain. Skin: Negative for rash. Neurological: Negative for headaches, focal weakness or numbness.  10-point ROS  otherwise negative.  ____________________________________________   PHYSICAL EXAM:  VITAL SIGNS: ED Triage Vitals [11/14/16 1257]  Enc Vitals Group     BP (!) 175/114     Pulse Rate 72     Resp 16     Temp 99.9 F (37.7 C)     Temp Source Oral     SpO2 96 %   Constitutional: Alert and oriented.  Eyes: Conjunctivae are normal.  Head: Atraumatic. Nose: No  congestion/rhinnorhea. Mouth/Throat: Mucous membranes are dry.  Neck: No stridor.   Cardiovascular: Normal rate, regular rhythm. Good peripheral circulation. Grossly normal heart sounds.   Respiratory: Normal respiratory effort.  No retractions. Lungs CTAB. Gastrointestinal: Soft and nontender. No distention.  Musculoskeletal: No lower extremity tenderness nor edema. No gross deformities of extremities. Neurologic:  Normal speech and language. No gross focal neurologic deficits are appreciated.  Skin:  Skin is warm, dry and intact. No rash noted.  ____________________________________________   LABS (all labs ordered are listed, but only abnormal results are displayed)  Labs Reviewed  BASIC METABOLIC PANEL - Abnormal; Notable for the following:       Result Value   Sodium 133 (*)    Chloride 95 (*)    Glucose, Bld 711 (*)    All other components within normal limits  CBC - Abnormal; Notable for the following:    RBC 3.32 (*)    Hemoglobin 8.6 (*)    HCT 26.8 (*)    MCH 25.9 (*)    RDW 15.7 (*)    All other components within normal limits  URINALYSIS, ROUTINE W REFLEX MICROSCOPIC - Abnormal; Notable for the following:    Glucose, UA >=500 (*)    Ketones, ur 5 (*)    Squamous Epithelial / LPF 0-5 (*)    All other components within normal limits  CBG MONITORING, ED - Abnormal; Notable for the following:    Glucose-Capillary >600 (*)    All other components within normal limits  CBG MONITORING, ED - Abnormal; Notable for the following:    Glucose-Capillary 469 (*)    All other components within normal limits  CBG MONITORING, ED   ____________________________________________  RADIOLOGY  None ____________________________________________   PROCEDURES  Procedure(s) performed:   Procedures  None ____________________________________________   INITIAL IMPRESSION / ASSESSMENT AND PLAN / ED COURSE  Pertinent labs & imaging results that were available during my care of  the patient were reviewed by me and considered in my medical decision making (see chart for details).  Patient presents to the emergency department for evaluation glycemia.  Patient with multiple recent ED presentations for similar. 19 ED visits in the last 6 months.  Patient reportedly has insulin at home but is noncompliant.  Plan for blood sugar control here and rule out DKA.   Blood sugar down-trending with insulin and IVF. Discussed with Case mgmt who saw the patient and placed f/u information in the chart. He was admitted in late October for this issue and continues to be non-compliant. I provided DM medication refills at discharge and encourage NP follow up as an outpatient.   ____________________________________________  FINAL CLINICAL IMPRESSION(S) / ED DIAGNOSES  Final diagnoses:  Hyperglycemia     MEDICATIONS GIVEN DURING THIS VISIT:  Medications  sodium chloride 0.9 % bolus 1,000 mL (0 mLs Intravenous Stopped 11/14/16 1621)  insulin aspart (novoLOG) injection 10 Units (10 Units Intravenous Given 11/14/16 1445)  glyBURIDE (DIABETA) tablet 5 mg (5 mg Oral Given 11/14/16 1612)  metFORMIN (GLUCOPHAGE) tablet  1,000 mg (1,000 mg Oral Given 11/14/16 1559)     NEW OUTPATIENT MEDICATIONS STARTED DURING THIS VISIT:  None  Note:  This document was prepared using Dragon voice recognition software and may include unintentional dictation errors.  Alona BeneJoshua Ciclaly Mulcahey, MD Emergency Medicine    Charisse Wendell, Arlyss RepressJoshua G, MD 11/14/16 [id number]

## 2016-11-14 NOTE — ED Provider Notes (Signed)
[place] DEPT Provider Note   CSN: [id number] Arrival date & time: 11/14/16  1859     History   Chief Complaint Chief Complaint  Patient presents with  . Hyperglycemia    HPI Mike [name] is a 56 y.o. male.  HPI  56 year old male presents with hyperglycemia and hypertension.  The patient is a very poor historian.  He was discharged from here at about 4 PM today.  When asked why he came back, he states that he just felt like he had hypertension.  Then when asked to clarify he states that his sugar was high.  He is known to be non-compliant and has been to the ED each day for the last week or more.  He acknowledges that he does not take his medicine.  He states he went to [employer] but the food tasted bad so he left today.  Someone gave him a diet coke and he states he had a hard time walking after that.  However he denies headache, dizziness, chest pain, or focal weakness/numbness.  Past Medical History:  Diagnosis Date  . Anxiety   . Anxiety   . Chronic lower back pain   . Depression   . DKA (diabetic ketoacidoses) ([place]) 07/30/2016  . Hyperlipemia   . Hypertension   . Migraine    "last one was ~ 4 yr ago" (12/23/2014)  . Seizures ([place])    "related to pills for anxiety; if I don't take the pills I'm suppose to take I'll have them" (12/23/2014)  . Type II diabetes mellitus [facility])     Patient Active Problem List   Diagnosis Date Noted  . Type 2 diabetes mellitus with hyperosmolarity without nonketotic hyperglycemic-hyperosmolar coma [facility]) ([place]) 11/06/2016  . Normocytic anemia 11/06/2016  . HLD (hyperlipidemia) 11/06/2016  . Adjustment disorder with other symptoms 11/05/2016  . CKD (chronic kidney disease) 10/22/2016  . Uncontrolled type 2 diabetes mellitus with hyperosmolar nonketotic hyperglycemia ([place]) 08/24/2016  . Essential hypertension 08/24/2016  . Acute ischemic stroke ([place])   . Acute renal failure superimposed on stage  2 chronic kidney disease ([place]) 07/31/2016  . Acute metabolic encephalopathy 57/84/6962  . DKA (diabetic ketoacidoses) ([place]) 06/24/2016  . Acute kidney injury (nontraumatic) ([place]) 06/24/2016  . Protein-calorie malnutrition, severe 12/24/2014  . Chest pain, pleuritic 12/23/2014  . Homelessness 12/23/2014  . Hypertensive urgency 12/23/2014  . DM (diabetes mellitus) ([place]) 12/23/2014  . Intermittent palpitations 12/23/2014  . Tobacco abuse 12/23/2014  . Pleuritic chest pain 12/23/2014  . Abnormal EKG 12/23/2014  . Cough   . Type II diabetes mellitus with renal manifestations [facility])     Past Surgical History:  Procedure Laterality Date  . NO PAST SURGERIES         Home Medications    Prior to Admission medications   Medication Sig Start Date End Date Taking? Authorizing Provider  aspirin 81 MG tablet Take 1 tablet (81 mg total) by mouth daily. 11/04/16 12/04/16  Doreatha Lew, MD  atorvastatin (LIPITOR) 40 MG tablet Take 1 tablet (40 mg total) by mouth daily. Patient not taking: Reported on 11/09/2016 11/04/16   Doreatha Lew, MD  blood glucose meter kit and supplies Dispense based on patient and insurance preference. Use up to four times daily as directed. (FOR ICD-9 250.00, 250.01). Patient not taking: Reported on 11/09/2016 11/08/16   Theodis Blaze, MD  glyBURIDE (DIABETA) 5 MG tablet Take 1 tablet (5 mg total) by mouth 2 (two) times daily with a  meal. 11/14/16   Long, Wonda Olds, MD  hydrALAZINE (APRESOLINE) 25 MG tablet Take 1 tablet (25 mg total) by mouth every 8 (eight) hours. 11/08/16   Theodis Blaze, MD  metFORMIN (GLUCOPHAGE) 1000 MG tablet Take 1 tablet (1,000 mg total) by mouth 2 (two) times daily. 11/14/16   Long, Wonda Olds, MD    Family History Family History  Problem Relation Age of Onset  . Diabetes Mellitus II Mother   . Diabetes Mellitus II Father     Social History Social History  Substance Use Topics  . Smoking status: Current Every Day Smoker      Packs/day: 0.10    Years: 40.00    Types: Cigarettes  . Smokeless tobacco: Never Used  . Alcohol use 1.2 oz/week    2 Cans of beer per week     Allergies   Ibuprofen and Tylenol [acetaminophen]   Review of Systems Review of Systems  Constitutional: Negative for fever.  Respiratory: Negative for shortness of breath.   Cardiovascular: Negative for chest pain.  Gastrointestinal: Negative for abdominal pain and vomiting.  Neurological: Negative for dizziness, weakness, light-headedness and numbness.  All other systems reviewed and are negative.    Physical Exam Updated Vital Signs BP (!) 172/85   Pulse (!) 54   Temp 97.8 F (36.6 C) (Oral)   Resp 20   SpO2 99%   Physical Exam  Constitutional: He is oriented to person, place, and time. He appears well-developed and well-nourished.  HENT:  Head: Normocephalic and atraumatic.  Right Ear: External ear normal.  Left Ear: External ear normal.  Nose: Nose normal.  Mouth/Throat: Oropharynx is clear and moist.  Eyes: Pupils are equal, round, and reactive to light. EOM are normal. Right eye exhibits no discharge. Left eye exhibits no discharge.  Neck: Neck supple.  Cardiovascular: Normal rate, regular rhythm and normal heart sounds.   Pulmonary/Chest: Effort normal and breath sounds normal.  Abdominal: Soft. There is no tenderness.  Musculoskeletal: He exhibits no edema.  Neurological: He is alert and oriented to person, place, and time.  CN 3-12 grossly intact. 5/5 strength in all 4 extremities. Grossly normal sensation. Normal finger to nose.   Skin: Skin is warm and dry.  Nursing note and vitals reviewed.    ED Treatments / Results  Labs (all labs ordered are listed, but only abnormal results are displayed) Labs Reviewed  CBG MONITORING, ED - Abnormal; Notable for the following:       Result Value   Glucose-Capillary 467 (*)    All other components within normal limits    EKG  EKG Interpretation None        Radiology No results found.  Procedures Procedures (including critical care time)  Medications Ordered in ED Medications  hydrALAZINE (APRESOLINE) tablet 25 mg (not administered)  insulin aspart (novoLOG) injection 10 Units (not administered)     Initial Impression / Assessment and Plan / ED Course  I have reviewed the triage vital signs and the nursing notes.  Pertinent labs & imaging results that were available during my care of the patient were reviewed by me and considered in my medical decision making (see chart for details).     Patient is known to this ER as he has been here 8 times since 10/22 including one time where he was admitted.  Today his glucose is similar to when he was assessed a few hours ago this afternoon.  Labs from earlier in the day were reviewed and  not consistent with DKA.  Given his glucose is lower than that initial blood work I have very low suspicion for DKA.  Besides hypertension which is chronic, he has stable vital signs.  His neuro exam is unremarkable.  He does not have any acute complaints except that he is homeless and does not seem to have anywhere to go.  However at this point there is no indication for further testing or admission criteria.  He will be given a dose of the oral hydralazine he is supposed to take for his blood pressure as well as some subcutaneous insulin.  Discharge with return precautions.  Final Clinical Impressions(s) / ED Diagnoses   Final diagnoses:  Hyperglycemia  Essential hypertension  Noncompliance with medications    New Prescriptions New Prescriptions   No medications on file     Sherwood Gambler, MD 11/14/16 2129

## 2016-11-15 ENCOUNTER — Encounter (HOSPITAL_COMMUNITY): Payer: Self-pay | Admitting: Emergency Medicine

## 2016-11-15 ENCOUNTER — Emergency Department (HOSPITAL_COMMUNITY)
Admission: EM | Admit: 2016-11-15 | Discharge: 2016-11-15 | Disposition: A | Discharge status: Home / Self Care | Payer: Self-pay | Attending: Emergency Medicine | Admitting: Emergency Medicine

## 2016-11-15 DIAGNOSIS — Z5900 Homelessness unspecified: Secondary | ICD-10-CM

## 2016-11-15 DIAGNOSIS — E1165 Type 2 diabetes mellitus with hyperglycemia: Secondary | ICD-10-CM | POA: Insufficient documentation

## 2016-11-15 DIAGNOSIS — I129 Hypertensive chronic kidney disease with stage 1 through stage 4 chronic kidney disease, or unspecified chronic kidney disease: Secondary | ICD-10-CM | POA: Insufficient documentation

## 2016-11-15 DIAGNOSIS — R739 Hyperglycemia, unspecified: Secondary | ICD-10-CM

## 2016-11-15 DIAGNOSIS — F17210 Nicotine dependence, cigarettes, uncomplicated: Secondary | ICD-10-CM | POA: Insufficient documentation

## 2016-11-15 DIAGNOSIS — R32 Unspecified urinary incontinence: Secondary | ICD-10-CM | POA: Insufficient documentation

## 2016-11-15 DIAGNOSIS — N189 Chronic kidney disease, unspecified: Secondary | ICD-10-CM | POA: Insufficient documentation

## 2016-11-15 LAB — I-STAT CHEM 8, ED
BUN: 10 mg/dL (ref 6–20)
BUN: 9 mg/dL (ref 6–20)
BUN: 9 mg/dL (ref 6–20)
CALCIUM ION: 1.19 mmol/L (ref 1.15–1.40)
CALCIUM ION: 1.25 mmol/L (ref 1.15–1.40)
CALCIUM ION: 1.16 mmol/L (ref 1.15–1.40)
CHLORIDE: 103 mmol/L (ref 101–111)
Chloride: 96 mmol/L — ABNORMAL LOW (ref 101–111)
Chloride: 101 mmol/L (ref 101–111)
Creatinine, Ser: 0.8 mg/dL (ref 0.61–1.24)
Creatinine, Ser: 0.7 mg/dL (ref 0.61–1.24)
Creatinine, Ser: 0.6 mg/dL — ABNORMAL LOW (ref 0.61–1.24)
GLUCOSE: 334 mg/dL — AB (ref 65–99)
Glucose, Bld: >700 mg/dL (ref 65–99)
Glucose, Bld: 436 mg/dL — ABNORMAL HIGH (ref 65–99)
HCT: 32 % — ABNORMAL LOW (ref 39.0–52.0)
HCT: 31 % — ABNORMAL LOW (ref 39.0–52.0)
HEMATOCRIT: 31 % — AB (ref 39.0–52.0)
HEMOGLOBIN: 10.5 g/dL — AB (ref 13.0–17.0)
Hemoglobin: 10.9 g/dL — ABNORMAL LOW (ref 13.0–17.0)
Hemoglobin: 10.5 g/dL — ABNORMAL LOW (ref 13.0–17.0)
Potassium: 3.9 mmol/L (ref 3.5–5.1)
Potassium: 3.5 mmol/L (ref 3.5–5.1)
Potassium: 3.6 mmol/L (ref 3.5–5.1)
SODIUM: 133 mmol/L — AB (ref 135–145)
SODIUM: 140 mmol/L (ref 135–145)
Sodium: 139 mmol/L (ref 135–145)
TCO2: 26 mmol/L (ref 22–32)
TCO2: 28 mmol/L (ref 22–32)
TCO2: 26 mmol/L (ref 22–32)

## 2016-11-15 LAB — BASIC METABOLIC PANEL
ANION GAP: 11 (ref 5–15)
BUN: 12 mg/dL (ref 6–20)
CALCIUM: 8.7 mg/dL — AB (ref 8.9–10.3)
CHLORIDE: 96 mmol/L — AB (ref 101–111)
CO2: 26 mmol/L (ref 22–32)
Creatinine, Ser: 0.95 mg/dL (ref 0.61–1.24)
GFR calc non Af Amer: >60 mL/min (ref >60)
Glucose, Bld: 766 mg/dL (ref 65–99)
POTASSIUM: 3.8 mmol/L (ref 3.5–5.1)
Sodium: 133 mmol/L — ABNORMAL LOW (ref 135–145)

## 2016-11-15 LAB — URINALYSIS, ROUTINE W REFLEX MICROSCOPIC
BACTERIA UA: NONE SEEN
Bilirubin Urine: NEGATIVE
Glucose, UA: >=500 mg/dL — AB
Hgb urine dipstick: NEGATIVE
KETONES UR: NEGATIVE mg/dL
LEUKOCYTES UA: NEGATIVE
Nitrite: NEGATIVE
PROTEIN: NEGATIVE mg/dL
Specific Gravity, Urine: 1.029 (ref 1.005–1.030)
pH: 5 (ref 5.0–8.0)

## 2016-11-15 LAB — BLOOD GAS, VENOUS
ACID-BASE EXCESS: 1.5 mmol/L (ref 0.0–2.0)
Bicarbonate: 26.6 mmol/L (ref 20.0–28.0)
O2 Saturation: 55.1 %
PCO2 VEN: 47.3 mmHg (ref 44.0–60.0)
PO2 VEN: 32.2 mmHg (ref 32.0–45.0)
Patient temperature: 98.6
pH, Ven: 7.368 (ref 7.250–7.430)

## 2016-11-15 LAB — CBC WITH DIFFERENTIAL/PLATELET
BASOS ABS: 0 10*3/uL (ref 0.0–0.1)
Basophils Relative: 1 %
Eosinophils Absolute: 0.1 10*3/uL (ref 0.0–0.7)
Eosinophils Relative: 1 %
HEMATOCRIT: 28.8 % — AB (ref 39.0–52.0)
Hemoglobin: 9 g/dL — ABNORMAL LOW (ref 13.0–17.0)
LYMPHS ABS: 1.5 10*3/uL (ref 0.7–4.0)
LYMPHS PCT: 24 %
MCH: 25.4 pg — ABNORMAL LOW (ref 26.0–34.0)
MCHC: 31.3 g/dL (ref 30.0–36.0)
MCV: 81.1 fL (ref 78.0–100.0)
MONO ABS: 0.4 10*3/uL (ref 0.1–1.0)
Monocytes Relative: 7 %
NEUTROS ABS: 4.2 10*3/uL (ref 1.7–7.7)
Neutrophils Relative %: 67 %
Platelets: 366 10*3/uL (ref 150–400)
RBC: 3.55 MIL/uL — AB (ref 4.22–5.81)
RDW: 15.5 % (ref 11.5–15.5)
WBC: 6.2 10*3/uL (ref 4.0–10.5)

## 2016-11-15 LAB — CBG MONITORING, ED
GLUCOSE-CAPILLARY: 593 mg/dL — AB (ref 65–99)
GLUCOSE-CAPILLARY: 566 mg/dL — AB (ref 65–99)
GLUCOSE-CAPILLARY: 291 mg/dL — AB (ref 65–99)
Glucose-Capillary: >600 mg/dL (ref 65–99)
Glucose-Capillary: >600 mg/dL (ref 65–99)

## 2016-11-15 MED ORDER — SODIUM CHLORIDE 0.9 % IV SOLN
INTRAVENOUS | Status: DC
  Administered 2016-11-15: 5.4 [IU]/h via INTRAVENOUS
  Filled 2016-11-15: qty 1

## 2016-11-15 MED ORDER — SODIUM CHLORIDE 0.9 % IV SOLN: INTRAVENOUS | Status: DC

## 2016-11-15 MED ORDER — SODIUM CHLORIDE 0.9 % IV BOLUS (SEPSIS)
1000.0000 mL | Freq: Once | INTRAVENOUS | Status: AC
  Administered 2016-11-15: 1000 mL via INTRAVENOUS

## 2016-11-15 NOTE — ED Provider Notes (Signed)
[place] DEPT Provider Note   CSN: [id number] Arrival date & time: 11/14/16  2303     History   Chief Complaint Chief Complaint  Patient presents with  . Other    " Missed the bus" Homelessness malingering    HPI Mike [name] is a 56 y.o. male.  HPI The patient missed the bus and is here because he is homeless. He has no other complaints at this time. The patient has been sleeping during his entire visit per nursing. The patient was seen less than 8 hours ago for vague complaints.  Past Medical History:  Diagnosis Date  . Anxiety   . Anxiety   . Chronic lower back pain   . Depression   . DKA (diabetic ketoacidoses) ([place]) 07/30/2016  . Hyperlipemia   . Hypertension   . Migraine    "last one was ~ 4 yr ago" (12/23/2014)  . Seizures ([place])    "related to pills for anxiety; if I don't take the pills I'm suppose to take I'll have them" (12/23/2014)  . Type II diabetes mellitus [facility])     Patient Active Problem List   Diagnosis Date Noted  . Type 2 diabetes mellitus with hyperosmolarity without nonketotic hyperglycemic-hyperosmolar coma [employer]) ([place]) 11/06/2016  . Normocytic anemia 11/06/2016  . HLD (hyperlipidemia) 11/06/2016  . Adjustment disorder with other symptoms 11/05/2016  . CKD (chronic kidney disease) 10/22/2016  . Uncontrolled type 2 diabetes mellitus with hyperosmolar nonketotic hyperglycemia ([place]) 08/24/2016  . Essential hypertension 08/24/2016  . Acute ischemic stroke ([place])   . Acute renal failure superimposed on stage 2 chronic kidney disease ([place]) 07/31/2016  . Acute metabolic encephalopathy 69/48/5462  . DKA (diabetic ketoacidoses) ([place]) 06/24/2016  . Acute kidney injury (nontraumatic) ([place]) 06/24/2016  . Protein-calorie malnutrition, severe 12/24/2014  . Chest pain, pleuritic 12/23/2014  . Homelessness 12/23/2014  . Hypertensive urgency 12/23/2014  . DM (diabetes mellitus) ([place]) 12/23/2014  . Intermittent  palpitations 12/23/2014  . Tobacco abuse 12/23/2014  . Pleuritic chest pain 12/23/2014  . Abnormal EKG 12/23/2014  . Cough   . Type II diabetes mellitus with renal manifestations [facility])     Past Surgical History:  Procedure Laterality Date  . NO PAST SURGERIES         Home Medications    Prior to Admission medications   Medication Sig Start Date End Date Taking? Authorizing Provider  aspirin 81 MG tablet Take 1 tablet (81 mg total) by mouth daily. 11/04/16 12/04/16 Yes Doreatha Lew, MD  hydrALAZINE (APRESOLINE) 25 MG tablet Take 1 tablet (25 mg total) by mouth every 8 (eight) hours. 11/08/16  Yes Theodis Blaze, MD  atorvastatin (LIPITOR) 40 MG tablet Take 1 tablet (40 mg total) by mouth daily. Patient not taking: Reported on 11/09/2016 11/04/16   Doreatha Lew, MD  blood glucose meter kit and supplies Dispense based on patient and insurance preference. Use up to four times daily as directed. (FOR ICD-9 250.00, 250.01). Patient not taking: Reported on 11/09/2016 11/08/16   Theodis Blaze, MD  glyBURIDE (DIABETA) 5 MG tablet Take 1 tablet (5 mg total) by mouth 2 (two) times daily with a meal. 11/14/16   Long, Wonda Olds, MD  metFORMIN (GLUCOPHAGE) 1000 MG tablet Take 1 tablet (1,000 mg total) by mouth 2 (two) times daily. 11/14/16   Long, Wonda Olds, MD    Family History Family History  Problem Relation Age of Onset  . Diabetes Mellitus II Mother   . Diabetes Mellitus  II Father     Social History Social History  Substance Use Topics  . Smoking status: Current Every Day Smoker    Packs/day: 0.10    Years: 40.00    Types: Cigarettes  . Smokeless tobacco: Never Used  . Alcohol use 1.2 oz/week    2 Cans of beer per week     Allergies   Ibuprofen and Tylenol [acetaminophen]   Review of Systems Review of Systems All other systems negative except as documented in the HPI. All pertinent positives and negatives as reviewed in the HPI.   Physical Exam Updated  Vital Signs BP (!) 162/85 (BP Location: Right Arm)   Pulse 75   Temp 98.5 F (36.9 C)   Resp 18   SpO2 100%   Physical Exam  Constitutional: He is oriented to person, place, and time. He appears well-developed and well-nourished. No distress.  HENT:  Head: Normocephalic and atraumatic.  Eyes: Pupils are equal, round, and reactive to light.  Pulmonary/Chest: Effort normal.  Neurological: He is alert and oriented to person, place, and time.  Skin: Skin is warm and dry.  Psychiatric: He has a normal mood and affect.  Nursing note and vitals reviewed.    ED Treatments / Results  Labs (all labs ordered are listed, but only abnormal results are displayed) Labs Reviewed - No data to display  EKG  EKG Interpretation None       Radiology No results found.  Procedures Procedures (including critical care time)  Medications Ordered in ED Medications - No data to display   Initial Impression / Assessment and Plan / ED Course  I have reviewed the triage vital signs and the nursing notes.  Pertinent labs & imaging results that were available during my care of the patient were reviewed by me and considered in my medical decision making (see chart for details).     The patient is alert and oriented. The patient has been sleeping here in the ER over the last 8 hours. He has no definitive complaints at this time.  Final Clinical Impressions(s) / ED Diagnoses   Final diagnoses:  None    New Prescriptions New Prescriptions   No medications on file     Dalia Heading, PA-C 11/15/16 0630    Merryl Hacker, MD 11/15/16 [phone]

## 2016-11-15 NOTE — ED Triage Notes (Signed)
Pt return after discharge less than 8 hrs ago, denies pain or other complaint and when asked pt states he is here because he missed his bus. VS WNL and condition stable

## 2016-11-15 NOTE — Care Management Note (Signed)
Case Management Note  CM consulted for pt's continuing needs.  CM spoke with Tiburcio PeaHarris, PA and advised that pt has been given his medications on numerous occasions [facility] and [facility] and the pt has always "lost" them or has no answer about what happened to them by the next day.  Discussion turned to a conversation about capacity.  Since July, CM has not spoken with pt where it is clear that he understands how to use medications approrately or how to follow up appropriately on his own.  Most visits, there are little to no words exchanged in conversations and pt does not appear to be able to engage in understanding what is being discussed with him.  Consulted CSW who will assist further.  No further CM needs noted at this time.

## 2016-11-15 NOTE — ED Notes (Signed)
Patient found with urine in bed and on floor surrounding bed. Linen changed. Patient provided with call bell and reminded to call for bathroom assistance.

## 2016-11-15 NOTE — ED Notes (Signed)
Patient saturated in urine and feces upon arrival. Patient provided water, soap, and fresh linen to clean self.

## 2016-11-15 NOTE — ED Notes (Signed)
Patient given cheese and crackers. 

## 2016-11-15 NOTE — ED Triage Notes (Signed)
[place] has [place]. Ambulatory.  18g L Arm  BP 160/90 HR 90

## 2016-11-15 NOTE — Discharge Instructions (Signed)
Return here as needed. °

## 2016-11-15 NOTE — Progress Notes (Addendum)
Consult request has been received. CSW attempting to follow up at present time.  CSW spoke to the EDP and CM after information was requested of the CSW regarding determining capacity of the pt who has, per staff in the ED decompensated AEB the pt continuing the return to the ED to "get in from the cold", not taking meds, [facility] diabetes meds and  presenting as incontinent of urine and stool.  This Clinical research associatewriter spoke with another CSW who called APS who stated they are not "likely" to investigatye reports regarding the pt due to the pt being an undocumented immigrant".  The CSW this writer spoke to stated APS cannot or will not seek guardianship of an undocumented immigrant even if the EDP deems the pt incapacitated.  5:57 PM   CSW updated EDP.  Pt not medically cleared at this time.  CSW will continue to follow.  Mike PeaJonathan F. Murel Wigle, [name], LCAS, CSI Clinical Social Worker Ph: [phone]

## 2016-11-15 NOTE — Discharge Instructions (Signed)
Contact a health care provider if: °Your blood glucose is at or above 240 mg/dL (13.3 mmol/L) for 2 days in a row. °You have problems keeping your blood glucose in your target range. °You have frequent episodes of hyperglycemia. °Get help right away if: °You have difficulty breathing. °You have a change in how you think, feel, or act (mental status). °You have nausea or vomiting that does not go away. °

## 2016-11-15 NOTE — ED Notes (Signed)
CRITICAL VALUE STICKER  CRITICAL VALUE: Glucose 766  RECEIVER (on-site recipient of call): Jake T RN  DATE & TIME NOTIFIED: 11/15/16 1600  MESSENGER (representative from lab): Harrold DonathNathan   MD NOTIFIED: Cammy CopaAbigail PA  TIME OF NOTIFICATION: 1600  RESPONSE: see orders

## 2016-11-15 NOTE — ED Provider Notes (Signed)
[place] DEPT Provider Note   CSN: [id number] Arrival date & time: 11/15/16  1425     History   Chief Complaint Chief Complaint  Patient presents with  . Hyperglycemia    HPI Mike [name] is a 56 y.o. male he is well-known to this emergency department with history of hypertension and diabetes.  He has had 22 visits in the last 6 months and was discharged earlier today.  The patient has been in the ER 3 times in the last 24 hours.  He returns today stating that he feels he might have hypertension.  He is not very forthcoming with information.  Upon arrival the patient is extremely foul-smelling and incontinent of urine.  His blood sugar was found to be greater than 700.  I spoke with his primary care provider [name], NP who provides a more detailed history. The patient was last seen at the [facility] on 11/09[facility]2016. An aid worker [facility] and [facility], however he apparently lost his medication and has been out of them for 6 days. The patient has multiple social barriers including homelessness, he is also undocumented and cannot receive many services.   HPI  Past Medical History:  Diagnosis Date  . Anxiety   . Anxiety   . Chronic lower back pain   . Depression   . DKA (diabetic ketoacidoses) ([place]) 07/30/2016  . Hyperlipemia   . Hypertension   . Migraine    "last one was ~ 4 yr ago" (12/23/2014)  . Seizures ([place])    "related to pills for anxiety; if I don't take the pills I'm suppose to take I'll have them" (12/23/2014)  . Type II diabetes mellitus [facility])     Patient Active Problem List   Diagnosis Date Noted  . Type 2 diabetes mellitus with hyperosmolarity without nonketotic hyperglycemic-hyperosmolar coma [facility]) ([place]) 11/06/2016  . Normocytic anemia 11/06/2016  . HLD (hyperlipidemia) 11/06/2016  . Adjustment disorder with other symptoms 11/05/2016  . CKD (chronic kidney disease) 10/22/2016    . Uncontrolled type 2 diabetes mellitus with hyperosmolar nonketotic hyperglycemia ([place]) 08/24/2016  . Essential hypertension 08/24/2016  . Acute ischemic stroke ([place])   . Acute renal failure superimposed on stage 2 chronic kidney disease ([place]) 07/31/2016  . Acute metabolic encephalopathy 40/98/1191  . DKA (diabetic ketoacidoses) ([place]) 06/24/2016  . Acute kidney injury (nontraumatic) ([place]) 06/24/2016  . Protein-calorie malnutrition, severe 12/24/2014  . Chest pain, pleuritic 12/23/2014  . Homelessness 12/23/2014  . Hypertensive urgency 12/23/2014  . DM (diabetes mellitus) ([place]) 12/23/2014  . Intermittent palpitations 12/23/2014  . Tobacco abuse 12/23/2014  . Pleuritic chest pain 12/23/2014  . Abnormal EKG 12/23/2014  . Cough   . Type II diabetes mellitus with renal manifestations [facility])     Past Surgical History:  Procedure Laterality Date  . NO PAST SURGERIES         Home Medications    Prior to Admission medications   Medication Sig Start Date End Date Taking? Authorizing Provider  aspirin 81 MG tablet Take 1 tablet (81 mg total) by mouth daily. 11/04/16 12/04/16  Doreatha Lew, MD  atorvastatin (LIPITOR) 40 MG tablet Take 1 tablet (40 mg total) by mouth daily. Patient not taking: Reported on 11/09/2016 11/04/16   Doreatha Lew, MD  blood glucose meter kit and supplies Dispense based on patient and insurance preference. Use up to four times daily as directed. (FOR ICD-9 250.00, 250.01). Patient not taking: Reported on  11/09/2016 11/08/16   Theodis Blaze, MD  glyBURIDE (DIABETA) 5 MG tablet Take 1 tablet (5 mg total) by mouth 2 (two) times daily with a meal. 11/14/16   Long, Wonda Olds, MD  hydrALAZINE (APRESOLINE) 25 MG tablet Take 1 tablet (25 mg total) by mouth every 8 (eight) hours. 11/08/16   Theodis Blaze, MD  metFORMIN (GLUCOPHAGE) 1000 MG tablet Take 1 tablet (1,000 mg total) by mouth 2 (two) times daily. 11/14/16   Long, Wonda Olds, MD    Family  History Family History  Problem Relation Age of Onset  . Diabetes Mellitus II Mother   . Diabetes Mellitus II Father     Social History Social History  Substance Use Topics  . Smoking status: Current Every Day Smoker    Packs/day: 0.10    Years: 40.00    Types: Cigarettes  . Smokeless tobacco: Never Used  . Alcohol use 1.2 oz/week    2 Cans of beer per week     Allergies   Ibuprofen and Tylenol [acetaminophen]   Review of Systems Review of Systems Ten systems reviewed and are negative for acute change, except as noted in the HPI.    Physical Exam Updated Vital Signs BP (!) 172/65 (BP Location: Left Arm)   Pulse 80   Temp 98.9 F (37.2 C) (Oral)   Resp 18   SpO2 100%   Physical Exam  Constitutional: He is oriented to person, place, and time.  Thin male in NAD  HENT:  Head: Normocephalic and atraumatic.  Eyes: Pupils are equal, round, and reactive to light. EOM are normal.  Neck: Normal range of motion. Neck supple.  Cardiovascular: Normal rate and regular rhythm.   Pulmonary/Chest: Effort normal and breath sounds normal.  Abdominal: Soft. Bowel sounds are normal. There is no tenderness.  Genitourinary:  Genitourinary Comments: Incontinent of urine  Neurological: He is alert and oriented to person, place, and time.  Skin: Skin is warm and dry.  Nursing note and vitals reviewed.    ED Treatments / Results  Labs (all labs ordered are listed, but only abnormal results are displayed) Labs Reviewed  CBC WITH DIFFERENTIAL/PLATELET - Abnormal; Notable for the following:       Result Value   RBC 3.55 (*)    Hemoglobin 9.0 (*)    HCT 28.8 (*)    MCH 25.4 (*)    All other components within normal limits  CBG MONITORING, ED - Abnormal; Notable for the following:    Glucose-Capillary >600 (*)    All other components within normal limits  CBG MONITORING, ED - Abnormal; Notable for the following:    Glucose-Capillary >600 (*)    All other components within  normal limits  I-STAT CHEM 8, ED - Abnormal; Notable for the following:    Sodium 133 (*)    Chloride 96 (*)    Glucose, Bld >700 (*)    Hemoglobin 10.5 (*)    HCT 31.0 (*)    All other components within normal limits  URINE CULTURE  BASIC METABOLIC PANEL  BLOOD GAS, VENOUS  URINALYSIS, ROUTINE W REFLEX MICROSCOPIC  CBG MONITORING, ED    EKG  EKG Interpretation None       Radiology No results found.  Procedures Procedures (including critical care time)  Medications Ordered in ED Medications  sodium chloride 0.9 % bolus 1,000 mL (not administered)    And  sodium chloride 0.9 % bolus 1,000 mL (not administered)    And  0.9 %  sodium chloride infusion (not administered)  insulin regular (NOVOLIN R,HUMULIN R) 100 Units in sodium chloride 0.9 % 100 mL (1 Units/mL) infusion (not administered)     Initial Impression / Assessment and Plan / ED Course  I have reviewed the triage vital signs and the nursing notes.  Pertinent labs & imaging results that were available during my care of the patient were reviewed by me and considered in my medical decision making (see chart for details).     Patient seen with hyperglycemia. He is homeless with multiple social barriers. Social work and case work are highly involved in his care. His sugars have improved markedly with IV insulin and fluids. I am unable to obtain medications through the match program. Patient will need to follow up tomorrow at the [facility] to get medications. His is safe for discharge at this time.  Final Clinical Impressions(s) / ED Diagnoses   Final diagnoses:  Hyperglycemia    New Prescriptions New Prescriptions   No medications on file     Margarita Mail, PA-C 11/15/16 2132    Gareth Morgan, MD 11/16/16 1301

## 2016-11-15 NOTE — ED Notes (Signed)
Respiratory called for VBG.

## 2016-11-15 NOTE — ED Notes (Signed)
Bed: WA04 Expected date:  Expected time:  Means of arrival:  Comments: EMS- hyperglycemia  

## 2016-11-16 ENCOUNTER — Encounter (HOSPITAL_COMMUNITY): Payer: Self-pay

## 2016-11-16 ENCOUNTER — Emergency Department (HOSPITAL_COMMUNITY)
Admission: EM | Admit: 2016-11-16 | Discharge: 2016-11-17 | Disposition: A | Discharge status: Home / Self Care | Payer: Self-pay | Attending: Emergency Medicine | Admitting: Emergency Medicine

## 2016-11-16 DIAGNOSIS — I129 Hypertensive chronic kidney disease with stage 1 through stage 4 chronic kidney disease, or unspecified chronic kidney disease: Secondary | ICD-10-CM | POA: Insufficient documentation

## 2016-11-16 DIAGNOSIS — E1165 Type 2 diabetes mellitus with hyperglycemia: Secondary | ICD-10-CM | POA: Insufficient documentation

## 2016-11-16 DIAGNOSIS — Z7982 Long term (current) use of aspirin: Secondary | ICD-10-CM | POA: Insufficient documentation

## 2016-11-16 DIAGNOSIS — E785 Hyperlipidemia, unspecified: Secondary | ICD-10-CM | POA: Insufficient documentation

## 2016-11-16 DIAGNOSIS — Z79899 Other long term (current) drug therapy: Secondary | ICD-10-CM | POA: Insufficient documentation

## 2016-11-16 DIAGNOSIS — Z7984 Long term (current) use of oral hypoglycemic drugs: Secondary | ICD-10-CM | POA: Insufficient documentation

## 2016-11-16 DIAGNOSIS — R739 Hyperglycemia, unspecified: Secondary | ICD-10-CM

## 2016-11-16 DIAGNOSIS — F17210 Nicotine dependence, cigarettes, uncomplicated: Secondary | ICD-10-CM | POA: Insufficient documentation

## 2016-11-16 DIAGNOSIS — N182 Chronic kidney disease, stage 2 (mild): Secondary | ICD-10-CM | POA: Insufficient documentation

## 2016-11-16 LAB — URINE CULTURE

## 2016-11-16 LAB — I-STAT CHEM 8, ED
BUN: 19 mg/dL (ref 6–20)
CALCIUM ION: 1.25 mmol/L (ref 1.15–1.40)
CREATININE: 1 mg/dL (ref 0.61–1.24)
Chloride: 92 mmol/L — ABNORMAL LOW (ref 101–111)
HCT: 30 % — ABNORMAL LOW (ref 39.0–52.0)
HEMOGLOBIN: 10.2 g/dL — AB (ref 13.0–17.0)
Potassium: 4.5 mmol/L (ref 3.5–5.1)
Sodium: 130 mmol/L — ABNORMAL LOW (ref 135–145)
TCO2: 29 mmol/L (ref 22–32)

## 2016-11-16 LAB — CBG MONITORING, ED: GLUCOSE-CAPILLARY: 430 mg/dL — AB (ref 65–99)

## 2016-11-16 MED ORDER — SODIUM CHLORIDE 0.9 % IV SOLN
999.0000 mL/h | INTRAVENOUS | Status: AC
  Administered 2016-11-16: 999 mL/h via INTRAVENOUS

## 2016-11-16 MED ORDER — SODIUM CHLORIDE 0.9 % IV SOLN
INTRAVENOUS | Status: DC
  Administered 2016-11-16: 5.4 [IU]/h via INTRAVENOUS
  Filled 2016-11-16 (×2): qty 1

## 2016-11-16 NOTE — ED Provider Notes (Signed)
[facility] DEPT Provider Note   CSN: [id number] Arrival date & time: 11/16/16  1914     History   Chief Complaint No chief complaint on file.   HPI Mike [name] is a 56 y.o. male.  HPI Patient presents to the emergency room for evaluation.  Patient states that he was discharged this morning after being seen in the emergency room for trouble with his blood sugar.  Has a very difficult situation and that he is homeless and undocumented and has difficulty getting any medical assistance.  Patient is supposed to be taking insulin but does not have any.  Patient states he was walking around a lot today after being discharged.  He thinks that stressed his body.  He came back to the emergency room for evaluation.  Denies any falls or injuries.  No vomiting or diarrhea.  He did not go and pick up any insulin today as planned Past Medical History:  Diagnosis Date  . Anxiety   . Anxiety   . Chronic lower back pain   . Depression   . DKA (diabetic ketoacidoses) ([place]) 07/30/2016  . Hyperlipemia   . Hypertension   . Migraine    "last one was ~ 4 yr ago" (12/23/2014)  . Seizures ([place])    "related to pills for anxiety; if I don't take the pills I'm suppose to take I'll have them" (12/23/2014)  . Type II diabetes mellitus [facility])     Patient Active Problem List   Diagnosis Date Noted  . Type 2 diabetes mellitus with hyperosmolarity without nonketotic hyperglycemic-hyperosmolar coma [facility]) ([place]) 11/06/2016  . Normocytic anemia 11/06/2016  . HLD (hyperlipidemia) 11/06/2016  . Adjustment disorder with other symptoms 11/05/2016  . CKD (chronic kidney disease) 10/22/2016  . Uncontrolled type 2 diabetes mellitus with hyperosmolar nonketotic hyperglycemia ([place]) 08/24/2016  . Essential hypertension 08/24/2016  . Acute ischemic stroke ([place])   . Acute renal failure superimposed on stage 2 chronic kidney disease ([place]) 07/31/2016  . Acute metabolic encephalopathy  22/02/5425  . DKA (diabetic ketoacidoses) ([place]) 06/24/2016  . Acute kidney injury (nontraumatic) ([place]) 06/24/2016  . Protein-calorie malnutrition, severe 12/24/2014  . Chest pain, pleuritic 12/23/2014  . Homelessness 12/23/2014  . Hypertensive urgency 12/23/2014  . DM (diabetes mellitus) ([place]) 12/23/2014  . Intermittent palpitations 12/23/2014  . Tobacco abuse 12/23/2014  . Pleuritic chest pain 12/23/2014  . Abnormal EKG 12/23/2014  . Cough   . Type II diabetes mellitus with renal manifestations [facility])     Past Surgical History:  Procedure Laterality Date  . NO PAST SURGERIES         Home Medications    Prior to Admission medications   Medication Sig Start Date End Date Taking? Authorizing Provider  aspirin 81 MG tablet Take 1 tablet (81 mg total) by mouth daily. 11/04/16 12/04/16 Yes Doreatha Lew, MD  atorvastatin (LIPITOR) 40 MG tablet Take 1 tablet (40 mg total) by mouth daily. 11/04/16  Yes Patrecia Pour, Christean Grief, MD  glyBURIDE (DIABETA) 5 MG tablet Take 1 tablet (5 mg total) by mouth 2 (two) times daily with a meal. 11/14/16  Yes Long, Wonda Olds, MD  hydrALAZINE (APRESOLINE) 25 MG tablet Take 1 tablet (25 mg total) by mouth every 8 (eight) hours. 11/08/16  Yes Theodis Blaze, MD  metFORMIN (GLUCOPHAGE) 1000 MG tablet Take 1 tablet (1,000 mg total) by mouth 2 (two) times daily. 11/14/16  Yes Long, Wonda Olds, MD  blood glucose meter kit and supplies Dispense based on  patient and insurance preference. Use up to four times daily as directed. (FOR ICD-9 250.00, 250.01). Patient not taking: Reported on 11/09/2016 11/08/16   Theodis Blaze, MD    Family History Family History  Problem Relation Age of Onset  . Diabetes Mellitus II Mother   . Diabetes Mellitus II Father     Social History Social History  Substance Use Topics  . Smoking status: Current Every Day Smoker    Packs/day: 0.10    Years: 40.00    Types: Cigarettes  . Smokeless tobacco: Never Used  . Alcohol  use 1.2 oz/week    2 Cans of beer per week     Allergies   Ibuprofen and Tylenol [acetaminophen]   Review of Systems Review of Systems  All other systems reviewed and are negative.    Physical Exam Updated Vital Signs Wt 69.6 kg (153 lb 7 oz)   SpO2 100%   BMI 18.68 kg/m   Physical Exam  Constitutional: No distress.  Malodorous  HENT:  Head: Normocephalic and atraumatic.  Right Ear: External ear normal.  Left Ear: External ear normal.  Eyes: Conjunctivae are normal. Right eye exhibits no discharge. Left eye exhibits no discharge. No scleral icterus.  Neck: Neck supple. No tracheal deviation present.  Cardiovascular: Normal rate, regular rhythm and intact distal pulses.   Pulmonary/Chest: Effort normal and breath sounds normal. No stridor. No respiratory distress. He has no wheezes. He has no rales.  Abdominal: Soft. Bowel sounds are normal. He exhibits no distension. There is no tenderness. There is no rebound and no guarding.  Musculoskeletal: He exhibits no edema or tenderness.  Neurological: He is alert. He has normal strength. No cranial nerve deficit (no facial droop, extraocular movements intact, no slurred speech) or sensory deficit. He exhibits normal muscle tone. He displays no seizure activity. Coordination normal.  Skin: Skin is warm and dry. No rash noted. He is not diaphoretic.  Psychiatric: He has a normal mood and affect.  Nursing note and vitals reviewed.    ED Treatments / Results  Labs (all labs ordered are listed, but only abnormal results are displayed) Labs Reviewed  CBG MONITORING, ED - Abnormal; Notable for the following:       Result Value   Glucose-Capillary >600 (*)    All other components within normal limits  I-STAT CHEM 8, ED - Abnormal; Notable for the following:    Sodium 130 (*)    Chloride 92 (*)    Glucose, Bld >700 (*)    Hemoglobin 10.2 (*)    HCT 30.0 (*)    All other components within normal limits     Radiology No  results found.  Procedures Procedures (including critical care time)  Medications Ordered in ED Medications  0.9 %  sodium chloride infusion (999 mL/hr Intravenous New Bag/Given 11/16/16 2226)  insulin regular (NOVOLIN R,HUMULIN R) 100 Units in sodium chloride 0.9 % 100 mL (1 Units/mL) infusion (5.4 Units/hr Intravenous New Bag/Given 11/16/16 2326)     Initial Impression / Assessment and Plan / ED Course  I have reviewed the triage vital signs and the nursing notes.  Pertinent labs & imaging results that were available during my care of the patient were reviewed by me and considered in my medical decision making (see chart for details).   Pt presents with recurrent hyperglycemia.   Pt has not been able to comply with medical treatment for various issues.  No signs of dka.  No acute infection.  Will  hydrate, insulin to help normalize his blood sugar.  Very difficult situation in that he will continue to have recurrent issues until he can take his medications properly.  Care will be turned over to overnight team  Final Clinical Impressions(s) / ED Diagnoses   Final diagnoses:  Hyperglycemia    New Prescriptions New Prescriptions   No medications on file     Dorie Rank, MD 11/16/16 2330

## 2016-11-16 NOTE — ED Triage Notes (Signed)
Pt BIB [facility] c/o hypertension. They state that they picked him up at the bar and he endorsed 1 beer. Denies pain. Denies SI/HI. Pt arrives with foul body odor and disheveled appearance. He has been seen multiple times. Ambulatory.

## 2016-11-17 ENCOUNTER — Emergency Department (HOSPITAL_COMMUNITY): Payer: Self-pay

## 2016-11-17 ENCOUNTER — Inpatient Hospital Stay (HOSPITAL_COMMUNITY)
Admission: EM | Admit: 2016-11-17 | Discharge: 2016-11-22 | DRG: 638 | Disposition: A | Discharge status: Home / Self Care | Payer: Self-pay | Attending: Family Medicine | Admitting: Family Medicine

## 2016-11-17 ENCOUNTER — Encounter (HOSPITAL_COMMUNITY): Payer: Self-pay | Admitting: *Deleted

## 2016-11-17 DIAGNOSIS — E872 Acidosis, unspecified: Secondary | ICD-10-CM | POA: Diagnosis present

## 2016-11-17 DIAGNOSIS — Z794 Long term (current) use of insulin: Secondary | ICD-10-CM

## 2016-11-17 DIAGNOSIS — Z9119 Patient's noncompliance with other medical treatment and regimen: Secondary | ICD-10-CM

## 2016-11-17 DIAGNOSIS — E119 Type 2 diabetes mellitus without complications: Secondary | ICD-10-CM

## 2016-11-17 DIAGNOSIS — Z79899 Other long term (current) drug therapy: Secondary | ICD-10-CM

## 2016-11-17 DIAGNOSIS — Z9114 Patient's other noncompliance with medication regimen: Secondary | ICD-10-CM

## 2016-11-17 DIAGNOSIS — Z833 Family history of diabetes mellitus: Secondary | ICD-10-CM

## 2016-11-17 DIAGNOSIS — I10 Essential (primary) hypertension: Secondary | ICD-10-CM | POA: Diagnosis present

## 2016-11-17 DIAGNOSIS — Z590 Homelessness: Secondary | ICD-10-CM

## 2016-11-17 DIAGNOSIS — Z888 Allergy status to other drugs, medicaments and biological substances status: Secondary | ICD-10-CM

## 2016-11-17 DIAGNOSIS — E1165 Type 2 diabetes mellitus with hyperglycemia: Principal | ICD-10-CM | POA: Diagnosis present

## 2016-11-17 DIAGNOSIS — E785 Hyperlipidemia, unspecified: Secondary | ICD-10-CM | POA: Diagnosis present

## 2016-11-17 DIAGNOSIS — F17210 Nicotine dependence, cigarettes, uncomplicated: Secondary | ICD-10-CM | POA: Diagnosis present

## 2016-11-17 DIAGNOSIS — Z7982 Long term (current) use of aspirin: Secondary | ICD-10-CM

## 2016-11-17 DIAGNOSIS — E860 Dehydration: Secondary | ICD-10-CM | POA: Diagnosis present

## 2016-11-17 LAB — I-STAT TROPONIN, ED: Troponin i, poc: 0 ng/mL (ref 0.00–0.08)

## 2016-11-17 LAB — CBC WITH DIFFERENTIAL/PLATELET
BASOS ABS: 0 10*3/uL (ref 0.0–0.1)
BASOS PCT: 1 %
EOS PCT: 2 %
Eosinophils Absolute: 0.1 10*3/uL (ref 0.0–0.7)
HCT: 30.3 % — ABNORMAL LOW (ref 39.0–52.0)
Hemoglobin: 9.5 g/dL — ABNORMAL LOW (ref 13.0–17.0)
Lymphocytes Relative: 35 %
Lymphs Abs: 1.5 10*3/uL (ref 0.7–4.0)
MCH: 24.9 pg — ABNORMAL LOW (ref 26.0–34.0)
MCHC: 31.4 g/dL (ref 30.0–36.0)
MCV: 79.5 fL (ref 78.0–100.0)
MONO ABS: 0.4 10*3/uL (ref 0.1–1.0)
Monocytes Relative: 10 %
NEUTROS ABS: 2.4 10*3/uL (ref 1.7–7.7)
Neutrophils Relative %: 52 %
PLATELETS: 331 10*3/uL (ref 150–400)
RBC: 3.81 MIL/uL — AB (ref 4.22–5.81)
RDW: 15.6 % — AB (ref 11.5–15.5)
WBC: 4.4 10*3/uL (ref 4.0–10.5)

## 2016-11-17 LAB — I-STAT CHEM 8, ED
BUN: 20 mg/dL (ref 6–20)
CHLORIDE: 96 mmol/L — AB (ref 101–111)
CREATININE: 1 mg/dL (ref 0.61–1.24)
Calcium, Ion: 1.22 mmol/L (ref 1.15–1.40)
Glucose, Bld: >700 mg/dL (ref 65–99)
HCT: 34 % — ABNORMAL LOW (ref 39.0–52.0)
HEMOGLOBIN: 11.6 g/dL — AB (ref 13.0–17.0)
POTASSIUM: 5 mmol/L (ref 3.5–5.1)
Sodium: 132 mmol/L — ABNORMAL LOW (ref 135–145)
TCO2: 28 mmol/L (ref 22–32)

## 2016-11-17 LAB — COMPREHENSIVE METABOLIC PANEL
ALT: 21 U/L (ref 17–63)
ANION GAP: 10 (ref 5–15)
AST: 21 U/L (ref 15–41)
Albumin: 3.9 g/dL (ref 3.5–5.0)
Alkaline Phosphatase: 90 U/L (ref 38–126)
BUN: 17 mg/dL (ref 6–20)
CHLORIDE: 94 mmol/L — AB (ref 101–111)
CO2: 24 mmol/L (ref 22–32)
CREATININE: 1.28 mg/dL — AB (ref 0.61–1.24)
Calcium: 9.2 mg/dL (ref 8.9–10.3)
Glucose, Bld: 889 mg/dL (ref 65–99)
Potassium: 4.9 mmol/L (ref 3.5–5.1)
SODIUM: 128 mmol/L — AB (ref 135–145)
Total Bilirubin: 0.6 mg/dL (ref 0.3–1.2)
Total Protein: 6.9 g/dL (ref 6.5–8.1)

## 2016-11-17 LAB — URINALYSIS, ROUTINE W REFLEX MICROSCOPIC
BILIRUBIN URINE: NEGATIVE
Bacteria, UA: NONE SEEN
Hgb urine dipstick: NEGATIVE
Ketones, ur: NEGATIVE mg/dL
LEUKOCYTES UA: NEGATIVE
NITRITE: NEGATIVE
PH: 7 (ref 5.0–8.0)
Protein, ur: NEGATIVE mg/dL
RBC / HPF: NONE SEEN RBC/hpf (ref 0–5)
SPECIFIC GRAVITY, URINE: 1.025 (ref 1.005–1.030)
Squamous Epithelial / LPF: NONE SEEN
WBC, UA: NONE SEEN WBC/hpf (ref 0–5)

## 2016-11-17 LAB — BASIC METABOLIC PANEL
Anion gap: 9 (ref 5–15)
BUN: 16 mg/dL (ref 6–20)
CALCIUM: 9.5 mg/dL (ref 8.9–10.3)
CHLORIDE: 99 mmol/L — AB (ref 101–111)
CO2: 28 mmol/L (ref 22–32)
CREATININE: 0.95 mg/dL (ref 0.61–1.24)
GFR calc Af Amer: >60 mL/min (ref >60)
GFR calc non Af Amer: >60 mL/min (ref >60)
Glucose, Bld: 435 mg/dL — ABNORMAL HIGH (ref 65–99)
Potassium: 3.4 mmol/L — ABNORMAL LOW (ref 3.5–5.1)
SODIUM: 136 mmol/L (ref 135–145)

## 2016-11-17 LAB — I-STAT VENOUS BLOOD GAS, ED
BICARBONATE: 26.4 mmol/L (ref 20.0–28.0)
O2 Saturation: 74 %
PO2 VEN: 42 mmHg (ref 32.0–45.0)
TCO2: 28 mmol/L (ref 22–32)
pCO2, Ven: 47.2 mmHg (ref 44.0–60.0)
pH, Ven: 7.356 (ref 7.250–7.430)

## 2016-11-17 LAB — LIPASE, BLOOD: LIPASE: 29 U/L (ref 11–51)

## 2016-11-17 LAB — CBG MONITORING, ED
GLUCOSE-CAPILLARY: 115 mg/dL — AB (ref 65–99)
GLUCOSE-CAPILLARY: 223 mg/dL — AB (ref 65–99)
GLUCOSE-CAPILLARY: 580 mg/dL — AB (ref 65–99)
Glucose-Capillary: 415 mg/dL — ABNORMAL HIGH (ref 65–99)
Glucose-Capillary: 452 mg/dL — ABNORMAL HIGH (ref 65–99)

## 2016-11-17 LAB — ETHANOL

## 2016-11-17 LAB — I-STAT CG4 LACTIC ACID, ED: Lactic Acid, Venous: 1.62 mmol/L (ref 0.5–1.9)

## 2016-11-17 MED ORDER — SODIUM CHLORIDE 0.9 % IV BOLUS (SEPSIS)
500.0000 mL | Freq: Once | INTRAVENOUS | Status: AC
  Administered 2016-11-17: 500 mL via INTRAVENOUS

## 2016-11-17 MED ORDER — SODIUM CHLORIDE 0.9 % IV BOLUS (SEPSIS)
1000.0000 mL | Freq: Once | INTRAVENOUS | Status: AC
  Administered 2016-11-17: 1000 mL via INTRAVENOUS

## 2016-11-17 MED ORDER — INSULIN ASPART 100 UNIT/ML ~~LOC~~ SOLN
5.0000 [IU] | Freq: Once | SUBCUTANEOUS | Status: AC
  Administered 2016-11-17: 5 [IU] via SUBCUTANEOUS
  Filled 2016-11-17: qty 1

## 2016-11-17 MED ORDER — INSULIN ASPART 100 UNIT/ML ~~LOC~~ SOLN
10.0000 [IU] | Freq: Once | SUBCUTANEOUS | Status: AC
  Administered 2016-11-17: 10 [IU] via SUBCUTANEOUS
  Filled 2016-11-17: qty 1

## 2016-11-17 NOTE — ED Provider Notes (Signed)
Patient presented with hyperglycemia, signed out to me pending adequate glucose control.  Basic metabolic panel obtained showing no evidence of ketoacidosis.  Glucose has come down to 223, and he is discharged.  Results for orders placed or performed during the hospital encounter of 11/16/16  Basic metabolic panel  Result Value Ref Range   Sodium 136 135 - 145 mmol/L   Potassium 3.4 (L) 3.5 - 5.1 mmol/L   Chloride 99 (L) 101 - 111 mmol/L   CO2 28 22 - 32 mmol/L   Glucose, Bld 435 (H) 65 - 99 mg/dL   BUN 16 6 - 20 mg/dL   Creatinine, Ser 9.140.95 0.61 - 1.24 mg/dL   Calcium 9.5 8.9 - 78.210.3 mg/dL   GFR calc non Af Amer >60 >60 mL/min   GFR calc Af Amer >60 >60 mL/min   Anion gap 9 5 - 15  CBG monitoring, ED  Result Value Ref Range   Glucose-Capillary >600 (HH) 65 - 99 mg/dL  I-stat chem 8, ed  Result Value Ref Range   Sodium 130 (L) 135 - 145 mmol/L   Potassium 4.5 3.5 - 5.1 mmol/L   Chloride 92 (L) 101 - 111 mmol/L   BUN 19 6 - 20 mg/dL   Creatinine, Ser 9.561.00 0.61 - 1.24 mg/dL   Glucose, Bld >213>700 (HH) 65 - 99 mg/dL   Calcium, Ion 0.861.25 5.781.15 - 1.40 mmol/L   TCO2 29 22 - 32 mmol/L   Hemoglobin 10.2 (L) 13.0 - 17.0 g/dL   HCT 46.930.0 (L) 62.939.0 - 52.852.0 %   Comment NOTIFIED PHYSICIAN   CBG monitoring, ED  Result Value Ref Range   Glucose-Capillary 580 (HH) 65 - 99 mg/dL   Comment 1 Notify RN    Comment 2 Document in Chart   CBG monitoring, ED  Result Value Ref Range   Glucose-Capillary 415 (H) 65 - 99 mg/dL  CBG monitoring, ED  Result Value Ref Range   Glucose-Capillary 223 (H) 65 - 99 mg/dL      Dione BoozeGlick, Alysandra Lobue, MD 41/32/4409/03/05 [phone]

## 2016-11-17 NOTE — ED Provider Notes (Signed)
[facility] EMERGENCY DEPARTMENT Provider Note   CSN: [id number] Arrival date & time: 11/17/16  2011 History   Chief Complaint No chief complaint on file.  HPI Mike [name] is a 56 y.o. male with PMH of T2DM who presented as a code stemi. The patient denies that he has had any chest pain. States that he feels unwell and weak. The patient has a long history of poor medication compliance and is homeless. He does not provide very much information about why he called EMS.   The history is provided by the EMS personnel and the patient.    Past Medical History:  Diagnosis Date  . Anxiety   . Anxiety   . Chronic lower back pain   . Depression   . DKA (diabetic ketoacidoses) ([place]) 07/30/2016  . Hyperlipemia   . Hypertension   . Migraine    "last one was ~ 4 yr ago" (12/23/2014)  . Seizures ([employer])    "related to pills for anxiety; if I don't take the pills I'm suppose to take I'll have them" (12/23/2014)  . Type II diabetes mellitus [facility])     Patient Active Problem List   Diagnosis Date Noted  . Type 2 diabetes mellitus with hyperosmolarity without nonketotic hyperglycemic-hyperosmolar coma [facility]) ([place]) 11/06/2016  . Normocytic anemia 11/06/2016  . HLD (hyperlipidemia) 11/06/2016  . Adjustment disorder with other symptoms 11/05/2016  . CKD (chronic kidney disease) 10/22/2016  . Uncontrolled type 2 diabetes mellitus with hyperosmolar nonketotic hyperglycemia ([place]) 08/24/2016  . Essential hypertension 08/24/2016  . Acute ischemic stroke ([place])   . Acute renal failure superimposed on stage 2 chronic kidney disease ([place]) 07/31/2016  . Acute metabolic encephalopathy 38/33/3832  . DKA (diabetic ketoacidoses) ([place]) 06/24/2016  . Acute kidney injury (nontraumatic) ([place]) 06/24/2016  . Protein-calorie malnutrition, severe 12/24/2014  . Chest pain, pleuritic 12/23/2014  . Homelessness 12/23/2014  . Hypertensive urgency 12/23/2014  . DM (diabetes mellitus) ([place])  12/23/2014  . Intermittent palpitations 12/23/2014  . Tobacco abuse 12/23/2014  . Pleuritic chest pain 12/23/2014  . Abnormal EKG 12/23/2014  . Cough   . Type II diabetes mellitus with renal manifestations [facility])     Past Surgical History:  Procedure Laterality Date  . NO PAST SURGERIES      Home Medications    Prior to Admission medications   Medication Sig Start Date End Date Taking? Authorizing Provider  aspirin 81 MG tablet Take 1 tablet (81 mg total) by mouth daily. 11/04/16 12/04/16  Doreatha Lew, MD  atorvastatin (LIPITOR) 40 MG tablet Take 1 tablet (40 mg total) by mouth daily. 11/04/16   Doreatha Lew, MD  blood glucose meter kit and supplies Dispense based on patient and insurance preference. Use up to four times daily as directed. (FOR ICD-9 250.00, 250.01). Patient not taking: Reported on 11/09/2016 11/08/16   Theodis Blaze, MD  glyBURIDE (DIABETA) 5 MG tablet Take 1 tablet (5 mg total) by mouth 2 (two) times daily with a meal. 11/14/16   Long, Wonda Olds, MD  hydrALAZINE (APRESOLINE) 25 MG tablet Take 1 tablet (25 mg total) by mouth every 8 (eight) hours. 11/08/16   Theodis Blaze, MD  metFORMIN (GLUCOPHAGE) 1000 MG tablet Take 1 tablet (1,000 mg total) by mouth 2 (two) times daily. 11/14/16   Long, Wonda Olds, MD    Family History Family History  Problem Relation Age of Onset  . Diabetes Mellitus II Mother   . Diabetes Mellitus II Father  Social History Social History  Substance Use Topics  . Smoking status: Current Every Day Smoker    Packs/day: 0.10    Years: 40.00    Types: Cigarettes  . Smokeless tobacco: Never Used  . Alcohol use 1.2 oz/week    2 Cans of beer per week   Allergies   Ibuprofen and Tylenol [acetaminophen]  Review of Systems Review of Systems  Constitutional: Negative for chills and fever.  HENT: Negative for ear pain and sore throat.   Eyes: Negative for pain and visual disturbance.  Respiratory: Negative for cough and  shortness of breath.   Cardiovascular: Negative for chest pain and palpitations.  Gastrointestinal: Negative for abdominal pain and vomiting.  Genitourinary: Negative for dysuria and hematuria.  Musculoskeletal: Negative for arthralgias and back pain.  Skin: Negative for color change and rash.  Neurological: Positive for weakness. Negative for seizures and syncope.  All other systems reviewed and are negative.  Physical Exam Updated Vital Signs SpO2 100%   Physical Exam  Constitutional: He appears well-developed. He appears cachectic.  disheveled  HENT:  Head: Normocephalic and atraumatic.  Mouth/Throat: Mucous membranes are dry.  Eyes: Pupils are equal, round, and reactive to light. Conjunctivae and EOM are normal.  Neck: Normal range of motion. Neck supple.  Cardiovascular: Normal rate and regular rhythm.   No murmur heard. Pulmonary/Chest: Effort normal and breath sounds normal. No respiratory distress.  Abdominal: Soft. There is no tenderness.  Musculoskeletal: Normal range of motion. He exhibits no edema, tenderness or deformity.  Neurological: He is alert.  Skin: Skin is warm and dry.  Nursing note and vitals reviewed.  ED Treatments / Results  Labs (all labs ordered are listed, but only abnormal results are displayed) Labs Reviewed  I-STAT CHEM 8, ED - Abnormal; Notable for the following:       Result Value   Sodium 132 (*)    Chloride 96 (*)    Glucose, Bld >700 (*)    Hemoglobin 11.6 (*)    HCT 34.0 (*)    All other components within normal limits  COMPREHENSIVE METABOLIC PANEL  ETHANOL  LIPASE, BLOOD  CBC WITH DIFFERENTIAL/PLATELET  URINALYSIS, ROUTINE W REFLEX MICROSCOPIC  BLOOD GAS, VENOUS  I-STAT CG4 LACTIC ACID, ED  I-STAT TROPONIN, ED   EKG  EKG Interpretation None      Radiology Dg Chest Portable 1 View  Result Date: 11/17/2016 CLINICAL DATA:  Initial evaluation for code STEMI.  Weakness. EXAM: PORTABLE CHEST 1 VIEW COMPARISON:  Prior  radiograph from 11/05/2016. FINDINGS: Mild cardiomegaly.  Mediastinal silhouette within normal limits. Lungs hypoinflated. No focal infiltrates. No pulmonary edema or pleural effusion. No pneumothorax. No acute osseus abnormality.  Osteopenia. IMPRESSION: Shallow lung inflation with no active cardiopulmonary disease identified. Electronically Signed   By: Jeannine Boga M.D.   On: 11/17/2016 20:38   Procedures Procedures (including critical care time)  Medications Ordered in ED Medications  sodium chloride 0.9 % bolus 1,000 mL (1,000 mLs Intravenous New Bag/Given 11/17/16 2037)   Initial Impression / Assessment and Plan / ED Course  I have reviewed the triage vital signs and the nursing notes.  Pertinent labs & imaging results that were available during my care of the patient were reviewed by me and considered in my medical decision making (see chart for details).  Mike [name] is a 56 y.o. male who presented as a CODE STEMI.   Upon review of EKG with cardiology at bedside and comparison to previous, CODE STEMI canceled.  EKG revealed: normal sinus rhythm, similar when compared to previous  Patient denies any chest pain, but endorses generalized weakness and malaise.   Labs studies and imaging ordered  Labs remarkable for: hyperglycemia with initial glucose >700,  VBG wnl, lipase normal  Patient IVF and SQ insulin with improvement of glucose  The patient has multiple frequent visit in which he present with elevated blood glucose. He is homeless and poorly compliant with his medication. He has on multiple previous occasions been given resources for shelters and low cost care but does not utilize these resources.   His sister is at bedside, and she states he has been seen before at the [facility] clinic and I advise her that he should follow up with them first thing Monday morning. Patient's sister is in agreement with the plan.   At time of handoff repeat lactic acid pending.  Second lactic acid was rising due to unknown reason. Spoke to hospitalitst who requested repeat before possible admission.    Patient care handed off to Dr. Roxanne Mins.   Final Clinical Impressions(s) / ED Diagnoses   Final diagnoses:  Type 2 diabetes mellitus with hyperglycemia, with long-term current use of insulin ([place])    New Prescriptions New Prescriptions   No medications on file     Fenton Foy, MD 11/19/16 1114    Lajean Saver, MD 11/19/16 1455

## 2016-11-17 NOTE — Progress Notes (Signed)
Patient had emergency contact listed as Sebastian AcheVeronica Lindsay.  Called and tiold her patient is here in the  Emergency Dept. She is coming. Phebe Collaonna S Serenna Deroy, Chaplain

## 2016-11-17 NOTE — ED Triage Notes (Signed)
Pt to ED as Code STEMI. Pt initially called EMS for a sick call, has been recently admitted for DKA. EMS ran EKG for weakness. Pt CBG >600. Cardiology seen patient on arrival, cancelled Code STEMI. Pt noncomplaint with medications and is homeless

## 2016-11-18 DIAGNOSIS — E1165 Type 2 diabetes mellitus with hyperglycemia: Principal | ICD-10-CM

## 2016-11-18 DIAGNOSIS — Z794 Long term (current) use of insulin: Secondary | ICD-10-CM

## 2016-11-18 DIAGNOSIS — I10 Essential (primary) hypertension: Secondary | ICD-10-CM

## 2016-11-18 DIAGNOSIS — E872 Acidosis, unspecified: Secondary | ICD-10-CM | POA: Diagnosis present

## 2016-11-18 DIAGNOSIS — E1169 Type 2 diabetes mellitus with other specified complication: Secondary | ICD-10-CM

## 2016-11-18 LAB — GLUCOSE, CAPILLARY
GLUCOSE-CAPILLARY: 372 mg/dL — AB (ref 65–99)
GLUCOSE-CAPILLARY: 218 mg/dL — AB (ref 65–99)
Glucose-Capillary: 308 mg/dL — ABNORMAL HIGH (ref 65–99)
Glucose-Capillary: 209 mg/dL — ABNORMAL HIGH (ref 65–99)

## 2016-11-18 LAB — BASIC METABOLIC PANEL
Anion gap: 8 (ref 5–15)
BUN: 10 mg/dL (ref 6–20)
CALCIUM: 8.4 mg/dL — AB (ref 8.9–10.3)
CO2: 23 mmol/L (ref 22–32)
CREATININE: 0.7 mg/dL (ref 0.61–1.24)
Chloride: 106 mmol/L (ref 101–111)
Glucose, Bld: 175 mg/dL — ABNORMAL HIGH (ref 65–99)
Potassium: 3.6 mmol/L (ref 3.5–5.1)
SODIUM: 137 mmol/L (ref 135–145)

## 2016-11-18 LAB — I-STAT CG4 LACTIC ACID, ED
LACTIC ACID, VENOUS: 3.52 mmol/L — AB (ref 0.5–1.9)
Lactic Acid, Venous: 2.64 mmol/L (ref 0.5–1.9)

## 2016-11-18 LAB — CBG MONITORING, ED
Glucose-Capillary: 166 mg/dL — ABNORMAL HIGH (ref 65–99)
Glucose-Capillary: 406 mg/dL — ABNORMAL HIGH (ref 65–99)

## 2016-11-18 LAB — LACTIC ACID, PLASMA
LACTIC ACID, VENOUS: 2.1 mmol/L — AB (ref 0.5–1.9)
LACTIC ACID, VENOUS: 0.8 mmol/L (ref 0.5–1.9)

## 2016-11-18 MED ORDER — ATORVASTATIN CALCIUM 40 MG PO TABS
40.0000 mg | ORAL_TABLET | Freq: Every day | ORAL | Status: DC
  Administered 2016-11-18 – 2016-11-22 (×5): 40 mg via ORAL
  Filled 2016-11-18 (×5): qty 1

## 2016-11-18 MED ORDER — INSULIN ASPART 100 UNIT/ML ~~LOC~~ SOLN
10.0000 [IU] | Freq: Once | SUBCUTANEOUS | Status: AC
  Administered 2016-11-18: 10 [IU] via SUBCUTANEOUS
  Filled 2016-11-18: qty 1

## 2016-11-18 MED ORDER — CHLORHEXIDINE GLUCONATE 0.12 % MT SOLN
15.0000 mL | Freq: Two times a day (BID) | OROMUCOSAL | Status: DC
  Administered 2016-11-18 – 2016-11-22 (×9): 15 mL via OROMUCOSAL
  Filled 2016-11-18 (×9): qty 15

## 2016-11-18 MED ORDER — INSULIN ASPART PROT & ASPART (70-30 MIX) 100 UNIT/ML ~~LOC~~ SUSP
15.0000 [IU] | Freq: Two times a day (BID) | SUBCUTANEOUS | Status: DC
  Administered 2016-11-18 – 2016-11-19 (×3): 15 [IU] via SUBCUTANEOUS
  Filled 2016-11-18: qty 10

## 2016-11-18 MED ORDER — ONDANSETRON HCL 4 MG PO TABS: 4.0000 mg | ORAL_TABLET | Freq: Four times a day (QID) | ORAL | Status: DC | PRN

## 2016-11-18 MED ORDER — SODIUM CHLORIDE 0.9 % IV BOLUS (SEPSIS)
1000.0000 mL | Freq: Once | INTRAVENOUS | Status: AC
  Administered 2016-11-18: 1000 mL via INTRAVENOUS

## 2016-11-18 MED ORDER — HYDRALAZINE HCL 25 MG PO TABS
25.0000 mg | ORAL_TABLET | Freq: Three times a day (TID) | ORAL | Status: DC
  Administered 2016-11-18 – 2016-11-22 (×14): 25 mg via ORAL
  Filled 2016-11-18 (×14): qty 1

## 2016-11-18 MED ORDER — INSULIN GLARGINE 100 UNIT/ML ~~LOC~~ SOLN: 10.0000 [IU] | Freq: Every day | SUBCUTANEOUS | Status: DC

## 2016-11-18 MED ORDER — ORAL CARE MOUTH RINSE
15.0000 mL | Freq: Two times a day (BID) | OROMUCOSAL | Status: DC
  Administered 2016-11-18 – 2016-11-21 (×6): 15 mL via OROMUCOSAL

## 2016-11-18 MED ORDER — ENOXAPARIN SODIUM 40 MG/0.4ML ~~LOC~~ SOLN
40.0000 mg | SUBCUTANEOUS | Status: DC
  Administered 2016-11-18 – 2016-11-21 (×4): 40 mg via SUBCUTANEOUS
  Filled 2016-11-18 (×4): qty 0.4

## 2016-11-18 MED ORDER — SODIUM CHLORIDE 0.9 % IV SOLN: INTRAVENOUS | Status: DC

## 2016-11-18 MED ORDER — DEXTROSE-NACL 5-0.45 % IV SOLN: INTRAVENOUS | Status: DC

## 2016-11-18 MED ORDER — SODIUM CHLORIDE 0.9 % IV SOLN
INTRAVENOUS | Status: DC
  Administered 2016-11-18 (×2): via INTRAVENOUS

## 2016-11-18 MED ORDER — SODIUM CHLORIDE 0.9 % IV SOLN
INTRAVENOUS | Status: DC
  Filled 2016-11-18: qty 1

## 2016-11-18 MED ORDER — ACETAMINOPHEN 325 MG PO TABS: 650.0000 mg | ORAL_TABLET | Freq: Four times a day (QID) | ORAL | Status: DC | PRN

## 2016-11-18 MED ORDER — INSULIN ASPART 100 UNIT/ML ~~LOC~~ SOLN
0.0000 [IU] | Freq: Three times a day (TID) | SUBCUTANEOUS | Status: DC
  Administered 2016-11-18: 5 [IU] via SUBCUTANEOUS
  Administered 2016-11-18: 11 [IU] via SUBCUTANEOUS
  Administered 2016-11-18: 15 [IU] via SUBCUTANEOUS
  Administered 2016-11-19 (×2): 11 [IU] via SUBCUTANEOUS
  Administered 2016-11-19: 15 [IU] via SUBCUTANEOUS
  Administered 2016-11-20: 3 [IU] via SUBCUTANEOUS
  Administered 2016-11-20: 8 [IU] via SUBCUTANEOUS
  Administered 2016-11-20 – 2016-11-21 (×2): 3 [IU] via SUBCUTANEOUS
  Administered 2016-11-21: 2 [IU] via SUBCUTANEOUS
  Administered 2016-11-21: 11 [IU] via SUBCUTANEOUS
  Administered 2016-11-22: 3 [IU] via SUBCUTANEOUS
  Administered 2016-11-22: 5 [IU] via SUBCUTANEOUS
  Administered 2016-11-22: 8 [IU] via SUBCUTANEOUS

## 2016-11-18 MED ORDER — ASPIRIN EC 81 MG PO TBEC
81.0000 mg | DELAYED_RELEASE_TABLET | Freq: Every day | ORAL | Status: DC
  Administered 2016-11-18 – 2016-11-22 (×5): 81 mg via ORAL
  Filled 2016-11-18 (×5): qty 1

## 2016-11-18 MED ORDER — ACETAMINOPHEN 650 MG RE SUPP: 650.0000 mg | Freq: Four times a day (QID) | RECTAL | Status: DC | PRN

## 2016-11-18 MED ORDER — DEXTROSE 50 % IV SOLN: 25.0000 mL | INTRAVENOUS | Status: DC | PRN

## 2016-11-18 MED ORDER — INSULIN REGULAR BOLUS VIA INFUSION
0.0000 [IU] | Freq: Three times a day (TID) | INTRAVENOUS | Status: DC
  Filled 2016-11-18: qty 10

## 2016-11-18 MED ORDER — ONDANSETRON HCL 4 MG/2ML IJ SOLN: 4.0000 mg | Freq: Four times a day (QID) | INTRAMUSCULAR | Status: DC | PRN

## 2016-11-18 NOTE — Progress Notes (Signed)
Pt has arrived to 5w22. Alert and oriented to self. Family at the bedside. VS stable, no signs of acute distress, pt ambulatory, cardiac monitor in place and CCMD notified. Pt oriented to room and equipment, identified approprietly. Pt instructed to use call bell for assistance and call bell left with in reach. Will continue to monitor and treat pt per MD orders.

## 2016-11-18 NOTE — H&P (Addendum)
History and Physical    Mike [name] SEG:[id number] DOB: November 09, 1960 DOA: 11/17/2016  PCP: [name], NP  Patient coming from: Home  I have personally briefly reviewed patient's old medical records in [place]  Chief Complaint: Generalized weakness due to elevated BGL  HPI: Mike [name] is a 56 y.o. male with medical history significant of DM2, homelessness, doesn't take meds.  Patient presents to the ED for 2nd time today with uncontrolled BGLs.  States he feels weak.  BGL 800.  He was given 3.5L NS, insulin.  BGL down to 400s.  As they were going to discharge patient, repeat lactate was performed for reasons that are unclear and came back elevated at 3.6.  Second repeat lactate performed 2 hours later and was 2.6.  Patient asymptomatic and "feels fine" right now.  EDP refusing to discharge.   Review of Systems: As per HPI otherwise 10 point review of systems negative.   Past Medical History:  Diagnosis Date  . Anxiety   . Anxiety   . Chronic lower back pain   . Depression   . DKA (diabetic ketoacidoses) ([place]) 07/30/2016  . Hyperlipemia   . Hypertension   . Migraine    "last one was ~ 4 yr ago" (12/23/2014)  . Seizures ([place])    "related to pills for anxiety; if I don't take the pills I'm suppose to take I'll have them" (12/23/2014)  . Type II diabetes mellitus ([place])     Past Surgical History:  Procedure Laterality Date  . NO PAST SURGERIES       reports that he has been smoking Cigarettes.  He has a 4.00 pack-year smoking history. He has never used smokeless tobacco. He reports that he drinks about 1.2 oz of alcohol per week . He reports that he does not use drugs.  Allergies  Allergen Reactions  . Ibuprofen Nausea And Vomiting and Other (See Comments)    Reaction:  Bloating   . Tylenol [Acetaminophen] Nausea And Vomiting and Other (See Comments)    Reaction:  Bloating     Family History  Problem Relation Age of Onset  . Diabetes Mellitus II  Mother   . Diabetes Mellitus II Father      Prior to Admission medications   Medication Sig Start Date End Date Taking? Authorizing Provider  aspirin 81 MG tablet Take 1 tablet (81 mg total) by mouth daily. 11/04/16 12/04/16  Doreatha Lew, MD  atorvastatin (LIPITOR) 40 MG tablet Take 1 tablet (40 mg total) by mouth daily. 11/04/16   Doreatha Lew, MD  blood glucose meter kit and supplies Dispense based on patient and insurance preference. Use up to four times daily as directed. (FOR ICD-9 250.00, 250.01). Patient not taking: Reported on 11/09/2016 11/08/16   Theodis Blaze, MD  glyBURIDE (DIABETA) 5 MG tablet Take 1 tablet (5 mg total) by mouth 2 (two) times daily with a meal. 11/14/16   Long, Wonda Olds, MD  hydrALAZINE (APRESOLINE) 25 MG tablet Take 1 tablet (25 mg total) by mouth every 8 (eight) hours. 11/08/16   Theodis Blaze, MD  metFORMIN (GLUCOPHAGE) 1000 MG tablet Take 1 tablet (1,000 mg total) by mouth 2 (two) times daily. 11/14/16   Margette Fast, MD    Physical Exam: Vitals:   11/18/16 0215 11/18/16 0230 11/18/16 0245 11/18/16 0300  BP: 140/64 (!) 166/63 130/61 (!) 143/69  Pulse: 60 74 67 65  Resp:      SpO2: 99% 100% 99%  100%    Constitutional: NAD, calm, comfortable Eyes: PERRL, lids and conjunctivae normal ENMT: Mucous membranes are moist. Posterior pharynx clear of any exudate or lesions.Normal dentition.  Neck: normal, supple, no masses, no thyromegaly Respiratory: clear to auscultation bilaterally, no wheezing, no crackles. Normal respiratory effort. No accessory muscle use.  Cardiovascular: Regular rate and rhythm, no murmurs / rubs / gallops. No extremity edema. 2+ pedal pulses. No carotid bruits.  Abdomen: no tenderness, no masses palpated. No hepatosplenomegaly. Bowel sounds positive.  Musculoskeletal: no clubbing / cyanosis. No joint deformity upper and lower extremities. Good ROM, no contractures. Normal muscle tone.  Skin: no rashes, lesions,  ulcers. No induration Neurologic: CN 2-12 grossly intact. Sensation intact, DTR normal. Strength 5/5 in all 4.  Psychiatric: Normal judgment and insight. Alert and oriented x 3. Normal mood.    Labs on Admission: I have personally reviewed following labs and imaging studies  CBC:  Recent Labs Lab 11/11/16 1921  11/12/16 2225  11/13/16 1448 11/14/16 1316 11/15/16 1506  11/15/16 1758 11/15/16 1911 11/16/16 2231 11/17/16 2019 11/17/16 2024  WBC 5.0  --  5.9  --  5.8 6.8 6.2  --   --   --   --  4.4  --   NEUTROABS 3.2  --  3.5  --   --   --  4.2  --   --   --   --  2.4  --   HGB 8.6*  < > 8.9*  < > 8.3* 8.6* 9.0*  < > 10.9* 10.5* 10.2* 9.5* 11.6*  HCT 27.3*  < > 28.0*  < > 26.0* 26.8* 28.8*  < > 32.0* 31.0* 30.0* 30.3* 34.0*  MCV 80.5  --  79.5  --  79.5 80.7 81.1  --   --   --   --  79.5  --   PLT 325  --  335  --  344 376 366  --   --   --   --  331  --   < > = values in this interval not displayed. Basic Metabolic Panel:  Recent Labs Lab 11/13/16 1448 11/14/16 1316 11/15/16 1506  11/15/16 1911 11/16/16 2231 11/17/16 0017 11/17/16 2019 11/17/16 2024  NA 135 133* 133*  < > 139 130* 136 128* 132*  K 4.1 4.3 3.8  < > 3.6 4.5 3.4* 4.9 5.0  CL 99* 95* 96*  < > 103 92* 99* 94* 96*  CO2 '25 26 26  '$ --   --   --  28 24  --   GLUCOSE 739* 711* 766*  < > 334* >700* 435* 889* >700*  BUN '15 17 12  '$ < > '9 19 16 17 20  '$ CREATININE 0.88 1.22 0.95  < > 0.60* 1.00 0.95 1.28* 1.00  CALCIUM 8.7* 9.0 8.7*  --   --   --  9.5 9.2  --   < > = values in this interval not displayed. GFR: Estimated Creatinine Clearance: 82.2 mL/min (by C-G formula based on SCr of 1 mg/dL). Liver Function Tests:  Recent Labs Lab 11/11/16 1921 11/12/16 2225 11/17/16 2019  AST '17 23 21  '$ ALT '23 26 21  '$ ALKPHOS 87 108 90  BILITOT 0.7 0.6 0.6  PROT 6.7 7.1 6.9  ALBUMIN 3.5 3.8 3.9    Recent Labs Lab 11/17/16 2019  LIPASE 29   No results for input(s): AMMONIA in the last 168 hours. Coagulation  Profile: No results for input(s): INR, PROTIME in the last  168 hours. Cardiac Enzymes: No results for input(s): CKTOTAL, CKMB, CKMBINDEX, TROPONINI in the last 168 hours. BNP (last 3 results) No results for input(s): PROBNP in the last 8760 hours. HbA1C: No results for input(s): HGBA1C in the last 72 hours. CBG:  Recent Labs Lab 11/17/16 0138 11/17/16 0253 11/17/16 0355 11/17/16 2242 11/18/16 0017  GLUCAP 415* 223* 115* 452* 406*   Lipid Profile: No results for input(s): CHOL, HDL, LDLCALC, TRIG, CHOLHDL, LDLDIRECT in the last 72 hours. Thyroid Function Tests: No results for input(s): TSH, T4TOTAL, FREET4, T3FREE, THYROIDAB in the last 72 hours. Anemia Panel: No results for input(s): VITAMINB12, FOLATE, FERRITIN, TIBC, IRON, RETICCTPCT in the last 72 hours. Urine analysis:    Component Value Date/Time   COLORURINE COLORLESS (A) 11/17/2016 2052   APPEARANCEUR CLEAR 11/17/2016 2052   LABSPEC 1.025 11/17/2016 2052   PHURINE 7.0 11/17/2016 2052   GLUCOSEU >=500 (A) 11/17/2016 2052   HGBUR NEGATIVE 11/17/2016 2052   BILIRUBINUR NEGATIVE 11/17/2016 2052   KETONESUR NEGATIVE 11/17/2016 2052   PROTEINUR NEGATIVE 11/17/2016 2052   UROBILINOGEN 1.0 02/07/2011 1605   NITRITE NEGATIVE 11/17/2016 2052   LEUKOCYTESUR NEGATIVE 11/17/2016 2052    Radiological Exams on Admission: Dg Chest Portable 1 View  Result Date: 11/17/2016 CLINICAL DATA:  Initial evaluation for code STEMI.  Weakness. EXAM: PORTABLE CHEST 1 VIEW COMPARISON:  Prior radiograph from 11/05/2016. FINDINGS: Mild cardiomegaly.  Mediastinal silhouette within normal limits. Lungs hypoinflated. No focal infiltrates. No pulmonary edema or pleural effusion. No pneumothorax. No acute osseus abnormality.  Osteopenia. IMPRESSION: Shallow lung inflation with no active cardiopulmonary disease identified. Electronically Signed   By: Jeannine Boga M.D.   On: 11/17/2016 20:38    EKG: Independently  reviewed.  Assessment/Plan Principal Problem:   Lactic acidosis Active Problems:   DM (diabetes mellitus) ([place])   Essential hypertension    1. Lactic acidosis - 1. Serial lactates 2. IVF at 100 cc/hr 3. Repeat BMP now 2. DM with hyperglycemia - 1. SSI AC mod scale 2. Lantus 10 QHS 3. 3.5L bolus in ED thus far 4. Diabetes coordinator consult - not sure that PO hypoglycemics are gonna be enough for this patient with h/o DKA even if he were to be taking them.  Also if hes truly getting lactic acidosis, probably a contraindication to metformin. 3. HTN - continue home meds  DVT prophylaxis: Lovenox Code Status: Full Family Communication: Family at bedside Disposition Plan: Discharge, likely in AM Consults called: None Admission status: Place in obs   November Sypher, [facility] Pager [phone]  If 7AM-7PM, please contact day team taking care of patient [email or web address] Password [email or web address]  11/18/2016, 3:17 AM

## 2016-11-18 NOTE — Progress Notes (Signed)
Received diabetes coordinator consult for this patient. Our team is very familiar with this patient and have talked with him on numerous occasions. Patient is homeless and has medication issues.  Will continue to montior blood sugars while in the hospital.    Smith MinceKendra Bartolo Montanye RN BSN CDE Diabetes Coordinator Pager: [phone]  8am-5pm

## 2016-11-18 NOTE — Progress Notes (Signed)
Patient seen and examined, admitted earlier this morning by Dr. Julian ReilGardner, please see H&P for details  this is a 56 year old male with type 2 diabetes which is uncontrolled, homelessness and significant social issues admitted with hyperglycemia and dehydration, lactic acidosis. -Improved after insulin and IV fluids -Taking insulin becomes a challenge due to current homelessness, last hemoglobin A1c was 13 -[facility] -Social work and case management consult -This patient has had 7 admissions and 17 ED visits in 6 months

## 2016-11-18 NOTE — Progress Notes (Signed)
CRITICAL VALUE ALERT  Critical Value:  Lactic acid 2.1  Date & Time Notied:  11/18/16 at 0430  Provider Notified: Bruna PotterBlount, MD  Orders Received/Actions taken: 1 L bolus ordered

## 2016-11-19 LAB — GLUCOSE, CAPILLARY
GLUCOSE-CAPILLARY: 319 mg/dL — AB (ref 65–99)
GLUCOSE-CAPILLARY: 346 mg/dL — AB (ref 65–99)
GLUCOSE-CAPILLARY: 221 mg/dL — AB (ref 65–99)
Glucose-Capillary: 366 mg/dL — ABNORMAL HIGH (ref 65–99)

## 2016-11-19 MED ORDER — INSULIN ASPART PROT & ASPART (70-30 MIX) 100 UNIT/ML ~~LOC~~ SUSP
30.0000 [IU] | Freq: Two times a day (BID) | SUBCUTANEOUS | Status: DC
  Administered 2016-11-19 – 2016-11-22 (×6): 30 [IU] via SUBCUTANEOUS
  Filled 2016-11-19: qty 10

## 2016-11-19 NOTE — Progress Notes (Signed)
PROGRESS NOTE    Mike [name]  UXL:[id number] DOB: [birth year] DOA: 11/17/2016 PCP: [name], NP  Brief Narrative:this is a 56 year old male with type 2 diabetes which is uncontrolled, homelessness and significant social issues admitted with hyperglycemia and dehydration, lactic acidosis.  Assessment & Plan:   1. Diabetes mellitus/ uncontrolled with hyperglycemia -CBG was 800 on admission, patient's last hemoglobin A1c was 13 however unable to take insulins as [name] is homeless -Improving, continue insulin 70/30 will increase dose to 30 units twice a day -Child psychotherapist and case management consult -Not sure anything can be done about his living situation which was the main limiting factor for his medical care  2. Lactic acidosis -Due to dehydration, resolved  3. Noncompliance -Partly secondary to social issues  4. Hypertension -Continue hydralazine  5. Homelessness/social issues -Social work consult requested  6. Mild cognitive dysfunction versus early dementia -Suspected -check TSH   DVT prophylaxis:Lovenox Code Status: full code Family Communication:no family at bedside Disposition Plan: to be determined   Subjective: Feels okay, no new complaints  Objective: Vitals:   11/18/16 0415 11/18/16 1538 11/18/16 2134 11/19/16 0543  BP: (!) 164/82 (!) 179/84 (!) 151/72 (!) 154/61  Pulse: 68 61 64 (!) 58  Resp: 18 20 18 18   Temp: 98.1 F (36.7 C) 98.4 F (36.9 C) 98.6 F (37 C) 98.5 F (36.9 C)  TempSrc: Oral Oral Oral Oral  SpO2: 100% 100% 100% 100%  Weight: 71.7 kg (158 lb 1.1 oz)     Height: 6\' 4"  (1.93 m)       Intake/Output Summary (Last 24 hours) at 11/19/2016 1057 Last data filed at 11/19/2016 0700 Gross per 24 hour  Intake 942.5 ml  Output 1350 ml  Net -407.5 ml   Filed Weights   11/18/16 0415  Weight: 71.7 kg (158 lb 1.1 oz)    Examination:  General exam: average built, unkempt male sitting in a recliner, no distress, AAO 2 Respiratory  system: Clear to auscultation. Respiratory effort normal. Cardiovascular system: S1 & S2 heard, RRR. No JVD, murmurs, rubs, gallops  Gastrointestinal system: Abdomen is nondistended, soft and nontender.Normal bowel sounds heard. Central nervous system: Alert and oriented. No focal neurological deficits. Extremities: Symmetric 5 x 5 power. Skin: No rashes, lesions or ulcers Psychiatry: Judgement and insight appear normal. Mood & affect appropriate.     Data Reviewed:   CBC: Recent Labs  Lab 11/12/16 2225  11/13/16 1448 11/14/16 1316 11/15/16 1506  11/15/16 1758 11/15/16 1911 11/16/16 2231 11/17/16 2019 11/17/16 2024  WBC 5.9  --  5.8 6.8 6.2  --   --   --   --  4.4  --   NEUTROABS 3.5  --   --   --  4.2  --   --   --   --  2.4  --   HGB 8.9*   < > 8.3* 8.6* 9.0*   < > 10.9* 10.5* 10.2* 9.5* 11.6*  HCT 28.0*   < > 26.0* 26.8* 28.8*   < > 32.0* 31.0* 30.0* 30.3* 34.0*  MCV 79.5  --  79.5 80.7 81.1  --   --   --   --  79.5  --   PLT 335  --  344 376 366  --   --   --   --  331  --    < > = values in this interval not displayed.   Basic Metabolic Panel: Recent Labs  Lab 11/14/16 1316 11/15/16 1506  11/16/16 2231 11/17/16 0017 11/17/16 2019 11/17/16 2024 11/18/16 0317  NA 133* 133*   < > 130* 136 128* 132* 137  K 4.3 3.8   < > 4.5 3.4* 4.9 5.0 3.6  CL 95* 96*   < > 92* 99* 94* 96* 106  CO2 26 26  --   --  28 24  --  23  GLUCOSE 711* 766*   < > >700* 435* 889* >700* 175*  BUN 17 12   < > 19 16 17 20 10   CREATININE 1.22 0.95   < > 1.00 0.95 1.28* 1.00 0.70  CALCIUM 9.0 8.7*  --   --  9.5 9.2  --  8.4*   < > = values in this interval not displayed.   GFR: Estimated Creatinine Clearance: 105.8 mL/min (by C-G formula based on SCr of 0.7 mg/dL). Liver Function Tests: Recent Labs  Lab 11/12/16 2225 11/17/16 2019  AST 23 21  ALT 26 21  ALKPHOS 108 90  BILITOT 0.6 0.6  PROT 7.1 6.9  ALBUMIN 3.8 3.9   Recent Labs  Lab 11/17/16 2019  LIPASE 29   No results for  input(s): AMMONIA in the last 168 hours. Coagulation Profile: No results for input(s): INR, PROTIME in the last 168 hours. Cardiac Enzymes: No results for input(s): CKTOTAL, CKMB, CKMBINDEX, TROPONINI in the last 168 hours. BNP (last 3 results) No results for input(s): PROBNP in the last 8760 hours. HbA1C: No results for input(s): HGBA1C in the last 72 hours. CBG: Recent Labs  Lab 11/18/16 0750 11/18/16 1202 11/18/16 1739 11/18/16 2140 11/19/16 0754  GLUCAP 308* 372* 218* 209* 319*   Lipid Profile: No results for input(s): CHOL, HDL, LDLCALC, TRIG, CHOLHDL, LDLDIRECT in the last 72 hours. Thyroid Function Tests: No results for input(s): TSH, T4TOTAL, FREET4, T3FREE, THYROIDAB in the last 72 hours. Anemia Panel: No results for input(s): VITAMINB12, FOLATE, FERRITIN, TIBC, IRON, RETICCTPCT in the last 72 hours. Urine analysis:    Component Value Date/Time   COLORURINE COLORLESS (A) 11/17/2016 2052   APPEARANCEUR CLEAR 11/17/2016 2052   LABSPEC 1.025 11/17/2016 2052   PHURINE 7.0 11/17/2016 2052   GLUCOSEU >=500 (A) 11/17/2016 2052   HGBUR NEGATIVE 11/17/2016 2052   BILIRUBINUR NEGATIVE 11/17/2016 2052   KETONESUR NEGATIVE 11/17/2016 2052   PROTEINUR NEGATIVE 11/17/2016 2052   UROBILINOGEN 1.0 02/07/2011 1605   NITRITE NEGATIVE 11/17/2016 2052   LEUKOCYTESUR NEGATIVE 11/17/2016 2052   Sepsis Labs: @LABRCNTIP (procalcitonin:4,lacticidven:4)  ) Recent Results (from the past 240 hour(s))  Urine culture     Status: Abnormal   Collection Time: 11/15/16  3:03 PM  Result Value Ref Range Status   Specimen Description URINE, CLEAN CATCH  Final   Special Requests NONE  Final   Culture MULTIPLE SPECIES PRESENT, SUGGEST RECOLLECTION (A)  Final   Report Status 11/16/2016 FINAL  Final         Radiology Studies: Dg Chest Portable 1 View  Result Date: 11/17/2016 CLINICAL DATA:  Initial evaluation for code STEMI.  Weakness. EXAM: PORTABLE CHEST 1 VIEW COMPARISON:  Prior  radiograph from 11/05/2016. FINDINGS: Mild cardiomegaly.  Mediastinal silhouette within normal limits. Lungs hypoinflated. No focal infiltrates. No pulmonary edema or pleural effusion. No pneumothorax. No acute osseus abnormality.  Osteopenia. IMPRESSION: Shallow lung inflation with no active cardiopulmonary disease identified. Electronically Signed   By: Rise MuBenjamin  McClintock M.D.   On: 11/17/2016 20:38        Scheduled Meds: . aspirin EC  81 mg Oral Daily  .  atorvastatin  40 mg Oral Daily  . chlorhexidine  15 mL Mouth Rinse BID  . enoxaparin (LOVENOX) injection  40 mg Subcutaneous Q24H  . hydrALAZINE  25 mg Oral Q8H  . insulin aspart  0-15 Units Subcutaneous TID WC  . insulin aspart protamine- aspart  30 Units Subcutaneous BID WC  . mouth rinse  15 mL Mouth Rinse q12n4p   Continuous Infusions:   LOS: 1 day    Time spent:    Zannie Cove, MD [facility] Page via [email or web address], password [email or web address] After 7PM please contact night-coverage  11/19/2016, 10:57 AM

## 2016-11-20 LAB — GLUCOSE, CAPILLARY
GLUCOSE-CAPILLARY: 188 mg/dL — AB (ref 65–99)
GLUCOSE-CAPILLARY: 276 mg/dL — AB (ref 65–99)
GLUCOSE-CAPILLARY: 200 mg/dL — AB (ref 65–99)
GLUCOSE-CAPILLARY: 171 mg/dL — AB (ref 65–99)

## 2016-11-20 NOTE — Care Management Note (Signed)
Case Management Note Donn PieriniKristi Lesly Pontarelli RN, BSN Unit 4E-Case Manager-- [phone] coverage  [phone]  Patient Details  Name: Mike [name] MRN: [id number] Date of Birth: 31-Jul-1960  Subjective/Objective:   Pt admitted  With DM/uncontrolled with hyperglycemia                Action/Plan: PT is homeless- well known to CM/CSW- he has been assisted with medications on several occasions (pt "losses them or can not attest to what happens to meds") and last MATCH was in July 2018- CM is unable to assist pt any further at this time with medications- pt has been [facility] multiple times [facility]- [name] and help him get his meds via the [facility].  Pt has stated that he knows this on previous admissions and does not seem to f/u. Will place this info again on pt's AVS- no further CM needs at this time. CSW to see pt for resources.   Expected Discharge Date:                  Expected Discharge Plan:     In-House Referral:     Discharge planning Services     Post Acute Care Choice:    Choice offered to:     DME Arranged:    DME Agency:     HH Arranged:    HH Agency:     Status of Service:     If discussed at Long Length of Stay Meetings, dates discussed:    Discharge Disposition:   Additional Comments:  Darrold SpanWebster, Khamille Beynon Hall, RN 11/20/2016, 10:39 AM

## 2016-11-20 NOTE — Progress Notes (Signed)
PROGRESS NOTE    Mike [name]  WUJ:[id number] DOB: [birth year] DOA: 11/17/2016 PCP: [name], NP  Brief Narrative:this is a 56 year old male with type 2 diabetes which is uncontrolled, homelessness and significant social issues admitted with hyperglycemia and dehydration, lactic acidosis.  Assessment & Plan:   1. Diabetes mellitus/ uncontrolled with hyperglycemia -CBG was 800 on admission, patient's last hemoglobin A1c was 13 however unable to take insulin as he is homeless -Improving, continue insulin 70/30 will increase dose to 30 units twice a day -Child psychotherapist and case management consulteed -Not sure anything can be done about his living situation which was the main limiting factor for his medical care -as long as he is homeless I will be unable to discharge him on Insulin which is really what he needs, he will need to demonstrate compliance with PCP at [facility] before anyone will consider starting Insulin  2. Lactic acidosis -Due to dehydration, resolved  3. Noncompliance -Partly secondary to social issues  4. Hypertension -Continue hydralazine  5. Homelessness/social issues -Social work consult requested  6. Mild cognitive dysfunction versus early dementia -Suspected -TSH pending  DVT prophylaxis:Lovenox Code Status: full code Family Communication:no family at bedside Disposition Plan: to be determined   Subjective: Feels okay, no new complaints  Objective: Vitals:   11/19/16 1545 11/19/16 2204 11/20/16 0619 11/20/16 1403  BP: (!) 155/74 (!) 159/70 (!) 152/73 (!) 163/70  Pulse: 81 72 63   Resp: 18 16 16    Temp: 98.8 F (37.1 C) 99.2 F (37.3 C) 98.6 F (37 C)   TempSrc: Oral Oral Oral   SpO2: 99% 100% 100%   Weight:      Height:        Intake/Output Summary (Last 24 hours) at 11/20/2016 1502 Last data filed at 11/20/2016 1407 Gross per 24 hour  Intake 1350 ml  Output 1215 ml  Net 135 ml   Filed Weights   11/18/16 0415  Weight: 71.7 kg (158  lb 1.1 oz)    Examination:  Gen: average built, unkempt male sitting in bed, no distress, AAO 2 HEENT: PERRLA, Neck supple, no JVD Lungs: Good air movement bilaterally, CTAB CVS: RRR,No Gallops,Rubs or new Murmurs Abd: soft, Non tender, non distended, BS present Extremities: No Cyanosis, Clubbing or edema Skin: no new rashes Psychiatry: Judgement and insight appear normal. Mood & affect appropriate.     Data Reviewed:   CBC: Recent Labs  Lab 11/14/16 1316 11/15/16 1506  11/15/16 1758 11/15/16 1911 11/16/16 2231 11/17/16 2019 11/17/16 2024  WBC 6.8 6.2  --   --   --   --  4.4  --   NEUTROABS  --  4.2  --   --   --   --  2.4  --   HGB 8.6* 9.0*   < > 10.9* 10.5* 10.2* 9.5* 11.6*  HCT 26.8* 28.8*   < > 32.0* 31.0* 30.0* 30.3* 34.0*  MCV 80.7 81.1  --   --   --   --  79.5  --   PLT 376 366  --   --   --   --  331  --    < > = values in this interval not displayed.   Basic Metabolic Panel: Recent Labs  Lab 11/14/16 1316 11/15/16 1506  11/16/16 2231 11/17/16 0017 11/17/16 2019 11/17/16 2024 11/18/16 0317  NA 133* 133*   < > 130* 136 128* 132* 137  K 4.3 3.8   < > 4.5 3.4* 4.9 5.0  3.6  CL 95* 96*   < > 92* 99* 94* 96* 106  CO2 26 26  --   --  28 24  --  23  GLUCOSE 711* 766*   < > >700* 435* 889* >700* 175*  BUN 17 12   < > 19 16 17 20 10   CREATININE 1.22 0.95   < > 1.00 0.95 1.28* 1.00 0.70  CALCIUM 9.0 8.7*  --   --  9.5 9.2  --  8.4*   < > = values in this interval not displayed.   GFR: Estimated Creatinine Clearance: 105.8 mL/min (by C-G formula based on SCr of 0.7 mg/dL). Liver Function Tests: Recent Labs  Lab 11/17/16 2019  AST 21  ALT 21  ALKPHOS 90  BILITOT 0.6  PROT 6.9  ALBUMIN 3.9   Recent Labs  Lab 11/17/16 2019  LIPASE 29   No results for input(s): AMMONIA in the last 168 hours. Coagulation Profile: No results for input(s): INR, PROTIME in the last 168 hours. Cardiac Enzymes: No results for input(s): CKTOTAL, CKMB, CKMBINDEX,  TROPONINI in the last 168 hours. BNP (last 3 results) No results for input(s): PROBNP in the last 8760 hours. HbA1C: No results for input(s): HGBA1C in the last 72 hours. CBG: Recent Labs  Lab 11/19/16 1301 11/19/16 1722 11/19/16 2204 11/20/16 0737 11/20/16 1212  GLUCAP 346* 366* 221* 188* 276*   Lipid Profile: No results for input(s): CHOL, HDL, LDLCALC, TRIG, CHOLHDL, LDLDIRECT in the last 72 hours. Thyroid Function Tests: No results for input(s): TSH, T4TOTAL, FREET4, T3FREE, THYROIDAB in the last 72 hours. Anemia Panel: No results for input(s): VITAMINB12, FOLATE, FERRITIN, TIBC, IRON, RETICCTPCT in the last 72 hours. Urine analysis:    Component Value Date/Time   COLORURINE COLORLESS (A) 11/17/2016 2052   APPEARANCEUR CLEAR 11/17/2016 2052   LABSPEC 1.025 11/17/2016 2052   PHURINE 7.0 11/17/2016 2052   GLUCOSEU >=500 (A) 11/17/2016 2052   HGBUR NEGATIVE 11/17/2016 2052   BILIRUBINUR NEGATIVE 11/17/2016 2052   KETONESUR NEGATIVE 11/17/2016 2052   PROTEINUR NEGATIVE 11/17/2016 2052   UROBILINOGEN 1.0 02/07/2011 1605   NITRITE NEGATIVE 11/17/2016 2052   LEUKOCYTESUR NEGATIVE 11/17/2016 2052   Sepsis Labs: @LABRCNTIP (procalcitonin:4,lacticidven:4)  ) Recent Results (from the past 240 hour(s))  Urine culture     Status: Abnormal   Collection Time: 11/15/16  3:03 PM  Result Value Ref Range Status   Specimen Description URINE, CLEAN CATCH  Final   Special Requests NONE  Final   Culture MULTIPLE SPECIES PRESENT, SUGGEST RECOLLECTION (A)  Final   Report Status 11/16/2016 FINAL  Final         Radiology Studies: No results found.      Scheduled Meds: . aspirin EC  81 mg Oral Daily  . atorvastatin  40 mg Oral Daily  . chlorhexidine  15 mL Mouth Rinse BID  . enoxaparin (LOVENOX) injection  40 mg Subcutaneous Q24H  . hydrALAZINE  25 mg Oral Q8H  . insulin aspart  0-15 Units Subcutaneous TID WC  . insulin aspart protamine- aspart  30 Units Subcutaneous BID  WC  . mouth rinse  15 mL Mouth Rinse q12n4p   Continuous Infusions:   LOS: 2 days    Time spent: 35min    Zannie CovePreetha Kmari Halter, MD [facility] Page via [email or web address], password [email or web address] After 7PM please contact night-coverage  11/20/2016, 3:02 PM

## 2016-11-21 ENCOUNTER — Other Ambulatory Visit: Payer: Self-pay

## 2016-11-21 LAB — GLUCOSE, CAPILLARY
GLUCOSE-CAPILLARY: 181 mg/dL — AB (ref 65–99)
GLUCOSE-CAPILLARY: 90 mg/dL (ref 65–99)
Glucose-Capillary: 146 mg/dL — ABNORMAL HIGH (ref 65–99)
Glucose-Capillary: 303 mg/dL — ABNORMAL HIGH (ref 65–99)
Glucose-Capillary: 42 mg/dL — CL (ref 65–99)

## 2016-11-21 LAB — TSH: TSH: 1.254 u[IU]/mL (ref 0.350–4.500)

## 2016-11-21 NOTE — Progress Notes (Signed)
CSW received consult regarding homelessness. Patient reported that he has been staying on the street. CSW provided homeless resources and bus passes on patient's shadow chart. [facility]. Patient requested graham crackers and a diet coke. CSW alerted RN.   CSW signing off.  Osborne Cascoadia Tod Abrahamsen LCSWA [phone]

## 2016-11-21 NOTE — Progress Notes (Signed)
PROGRESS NOTE    Burhan Lipa  ZOX:[id number] DOB: 29-Oct-1960 DOA: 11/17/2016 PCP: [name], [name], NP  Brief Narrative:this is a 56 year old male with type 2 diabetes which is uncontrolled, homelessness and significant social issues admitted with hyperglycemia and dehydration, lactic acidosis. Hba1c is 13, also noted to have some cognitive issues   Assessment & Plan:   1. Diabetes mellitus/ uncontrolled with hyperglycemia -CBG was 800 on admission, patient's last hemoglobin A1c was 13 however unable to take insulin as he is homeless -Improving, continue insulin 70/30, increased dose to 30 units twice a day -Child psychotherapistocial worker and case management consulted -Not sure anything can be done about his living situation which was the main limiting factor for his medical care -as long as he is homeless I will be unable to discharge him on Insulin which is really what he needs, [facility] will need [facility] before anyone will consider starting Insulin, I also think he has mild cognitive dysfunction or early dementia which also severely affects his ability to manage DM and homelessness  2. Lactic acidosis -Due to dehydration, resolved  3. Noncompliance -Partly secondary to social issues  4. Hypertension -Continue hydralazine  5. Homelessness/social issues -Social work consult requested  6. Mild cognitive dysfunction versus early dementia suspected -CT head notes Chronic atrophy and small vessel ischemic changes -check TSH  DVT prophylaxis:Lovenox Code Status: full code Family Communication:no family at bedside Disposition Plan: to be determined   Subjective: Feels okay, no new complaints, asked me not to discharge him to the streets today with rains and cold weather  Objective: Vitals:   11/20/16 1450 11/20/16 2143 11/21/16 0528 11/21/16 1317  BP: (!) 153/71 (!) 153/67 (!) 146/66 (!) 154/64  Pulse: 81 69 (!) 56 91  Resp: 20 17 17 16   Temp: 99 F (37.2  C) 98.7 F (37.1 C) 98.1 F (36.7 C) 98.7 F (37.1 C)  TempSrc: Oral Oral Oral Oral  SpO2: 100% 99% 100% 100%  Weight:      Height:        Intake/Output Summary (Last 24 hours) at 11/21/2016 1503 Last data filed at 11/21/2016 0931 Gross per 24 hour  Intake 1133 ml  Output 1890 ml  Net -757 ml   Filed Weights   11/18/16 0415  Weight: 71.7 kg (158 lb 1.1 oz)    Examination:  Gen: average built, unkempt male sitting in bed, no distress, AAO 2 HEENT: PERRLA, Neck supple, no JVD Lungs: Good air movement bilaterally, CTAB CVS: RRR,No Gallops,Rubs or new Murmurs Abd: soft, Non tender, non distended, BS present Extremities: No Cyanosis, Clubbing or edema Skin: no new rashes Psychiatry: Judgement and insight appear normal. Mood & affect appropriate.     Data Reviewed:   CBC: Recent Labs  Lab 11/15/16 1506  11/15/16 1758 11/15/16 1911 11/16/16 2231 11/17/16 2019 11/17/16 2024  WBC 6.2  --   --   --   --  4.4  --   NEUTROABS 4.2  --   --   --   --  2.4  --   HGB 9.0*   < > 10.9* 10.5* 10.2* 9.5* 11.6*  HCT 28.8*   < > 32.0* 31.0* 30.0* 30.3* 34.0*  MCV 81.1  --   --   --   --  79.5  --   PLT 366  --   --   --   --  331  --    < > = values in this interval  not displayed.   Basic Metabolic Panel: Recent Labs  Lab 11/15/16 1506  11/16/16 2231 11/17/16 0017 11/17/16 2019 11/17/16 2024 11/18/16 0317  NA 133*   < > 130* 136 128* 132* 137  K 3.8   < > 4.5 3.4* 4.9 5.0 3.6  CL 96*   < > 92* 99* 94* 96* 106  CO2 26  --   --  28 24  --  23  GLUCOSE 766*   < > >700* 435* 889* >700* 175*  BUN 12   < > 19 16 17 20 10   CREATININE 0.95   < > 1.00 0.95 1.28* 1.00 0.70  CALCIUM 8.7*  --   --  9.5 9.2  --  8.4*   < > = values in this interval not displayed.   GFR: Estimated Creatinine Clearance: 105.8 mL/min (by C-G formula based on SCr of 0.7 mg/dL). Liver Function Tests: Recent Labs  Lab 11/17/16 2019  AST 21  ALT 21  ALKPHOS 90  BILITOT 0.6  PROT 6.9  ALBUMIN  3.9   Recent Labs  Lab 11/17/16 2019  LIPASE 29   No results for input(s): AMMONIA in the last 168 hours. Coagulation Profile: No results for input(s): INR, PROTIME in the last 168 hours. Cardiac Enzymes: No results for input(s): CKTOTAL, CKMB, CKMBINDEX, TROPONINI in the last 168 hours. BNP (last 3 results) No results for input(s): PROBNP in the last 8760 hours. HbA1C: No results for input(s): HGBA1C in the last 72 hours. CBG: Recent Labs  Lab 11/20/16 1212 11/20/16 1634 11/20/16 2145 11/21/16 0808 11/21/16 1206  GLUCAP 276* 200* 171* 146* 181*   Lipid Profile: No results for input(s): CHOL, HDL, LDLCALC, TRIG, CHOLHDL, LDLDIRECT in the last 72 hours. Thyroid Function Tests: No results for input(s): TSH, T4TOTAL, FREET4, T3FREE, THYROIDAB in the last 72 hours. Anemia Panel: No results for input(s): VITAMINB12, FOLATE, FERRITIN, TIBC, IRON, RETICCTPCT in the last 72 hours. Urine analysis:    Component Value Date/Time   COLORURINE COLORLESS (A) 11/17/2016 2052   APPEARANCEUR CLEAR 11/17/2016 2052   LABSPEC 1.025 11/17/2016 2052   PHURINE 7.0 11/17/2016 2052   GLUCOSEU >=500 (A) 11/17/2016 2052   HGBUR NEGATIVE 11/17/2016 2052   BILIRUBINUR NEGATIVE 11/17/2016 2052   KETONESUR NEGATIVE 11/17/2016 2052   PROTEINUR NEGATIVE 11/17/2016 2052   UROBILINOGEN 1.0 02/07/2011 1605   NITRITE NEGATIVE 11/17/2016 2052   LEUKOCYTESUR NEGATIVE 11/17/2016 2052   Sepsis Labs: @LABRCNTIP (procalcitonin:4,lacticidven:4)  ) Recent Results (from the past 240 hour(s))  Urine culture     Status: Abnormal   Collection Time: 11/15/16  3:03 PM  Result Value Ref Range Status   Specimen Description URINE, CLEAN CATCH  Final   Special Requests NONE  Final   Culture MULTIPLE SPECIES PRESENT, SUGGEST RECOLLECTION (A)  Final   Report Status 11/16/2016 FINAL  Final         Radiology Studies: No results found.      Scheduled Meds: . aspirin EC  81 mg Oral Daily  . atorvastatin   40 mg Oral Daily  . chlorhexidine  15 mL Mouth Rinse BID  . enoxaparin (LOVENOX) injection  40 mg Subcutaneous Q24H  . hydrALAZINE  25 mg Oral Q8H  . insulin aspart  0-15 Units Subcutaneous TID WC  . insulin aspart protamine- aspart  30 Units Subcutaneous BID WC  . mouth rinse  15 mL Mouth Rinse q12n4p   Continuous Infusions:   LOS: 3 days    Time spent:  Zannie CovePreetha Brittanee Ghazarian, MD [facility] Page via [email or web address], password [email or web address] After 7PM please contact night-coverage  11/21/2016, 3:03 PM

## 2016-11-22 ENCOUNTER — Other Ambulatory Visit: Payer: Self-pay

## 2016-11-22 DIAGNOSIS — F329 Major depressive disorder, single episode, unspecified: Secondary | ICD-10-CM | POA: Insufficient documentation

## 2016-11-22 DIAGNOSIS — Z79899 Other long term (current) drug therapy: Secondary | ICD-10-CM | POA: Insufficient documentation

## 2016-11-22 DIAGNOSIS — Z794 Long term (current) use of insulin: Secondary | ICD-10-CM | POA: Insufficient documentation

## 2016-11-22 DIAGNOSIS — F17210 Nicotine dependence, cigarettes, uncomplicated: Secondary | ICD-10-CM | POA: Insufficient documentation

## 2016-11-22 DIAGNOSIS — F419 Anxiety disorder, unspecified: Secondary | ICD-10-CM | POA: Insufficient documentation

## 2016-11-22 DIAGNOSIS — I10 Essential (primary) hypertension: Secondary | ICD-10-CM | POA: Insufficient documentation

## 2016-11-22 DIAGNOSIS — D649 Anemia, unspecified: Secondary | ICD-10-CM | POA: Insufficient documentation

## 2016-11-22 DIAGNOSIS — E1165 Type 2 diabetes mellitus with hyperglycemia: Secondary | ICD-10-CM | POA: Insufficient documentation

## 2016-11-22 LAB — GLUCOSE, CAPILLARY
GLUCOSE-CAPILLARY: 215 mg/dL — AB (ref 65–99)
Glucose-Capillary: 83 mg/dL (ref 65–99)
Glucose-Capillary: 178 mg/dL — ABNORMAL HIGH (ref 65–99)
Glucose-Capillary: 270 mg/dL — ABNORMAL HIGH (ref 65–99)

## 2016-11-22 LAB — CBG MONITORING, ED: Glucose-Capillary: 335 mg/dL — ABNORMAL HIGH (ref 65–99)

## 2016-11-22 MED ORDER — BLOOD GLUCOSE MONITOR KIT: PACK | 0 refills | Status: DC

## 2016-11-22 MED ORDER — INSULIN ASPART PROT & ASPART (70-30 MIX) 100 UNIT/ML ~~LOC~~ SUSP
27.0000 [IU] | Freq: Two times a day (BID) | SUBCUTANEOUS | Status: DC
  Administered 2016-11-22: 27 [IU] via SUBCUTANEOUS

## 2016-11-22 MED ORDER — INSULIN ASPART PROT & ASPART (70-30 MIX) 100 UNIT/ML ~~LOC~~ SUSP: 25.0000 [IU] | Freq: Two times a day (BID) | SUBCUTANEOUS | 0 refills | Status: DC

## 2016-11-22 NOTE — Progress Notes (Signed)
Patient discharge teaching given, including activity, diet, follow-up appoints, and medications. Patient verbalized understanding of all discharge instructions. IV access was d/c'd. Vitals are stable. Skin is intact except as charted in most recent assessments. Pt to be escorted out by NT, to bus.

## 2016-11-22 NOTE — Progress Notes (Signed)
Patient is alert and oriented, patient lyiing comfortably in bed in his room without distress.  NT reported cbg  Of 42 at 2230, because I was in the room with another patient I instructed NT to give 1 cup of orange juice.  When I got to the patient to recheck the blood sugar, it was 90mg /dl at [id number].  Patient was monitored through night. Another cbg at 0400 was 83mg /dl. I cup of orange juice given to support blood sugar. Will continue to monitor

## 2016-11-22 NOTE — Progress Notes (Signed)
Pt. Has limited resources and is homeless. Will give patient the opportunity to eat dinner and receive evening medications before discharging.

## 2016-11-22 NOTE — Discharge Summary (Signed)
Physician Discharge Summary  Mike [name] JJO:[id number] DOB: Jul 13, 1960 DOA: 11/17/2016  PCP: [name], NP  Admit date: 11/17/2016 Discharge date: 11/22/2016  Time spent: I> 35 minutes  Recommendations for Outpatient Follow-up:  Monitor blood sugars and adjust hypoglycemic agents accordingly  Discharge Diagnoses:  Principal Problem:   Lactic acidosis Active Problems:   DM (diabetes mellitus) ([place])   Essential hypertension   Discharge Condition: stable  Diet recommendation: diabetic diet  Filed Weights   11/18/16 0415  Weight: 71.7 kg (158 lb 1.1 oz)    History of present illness:  Mike [name] is a 56 y.o. male with medical history significant of DM2, homelessness, and history of non compliance with medication regimen who presented with an elevated blood sugar levels  Hospital Course:    1. Diabetes mellitus/ uncontrolled with hyperglycemia -CBG was 800 on admission, patient's last hemoglobin A1c was 13 however unable to take insulin as he is homeless -Improving, continue insulin 70/30, decreased dose to 25 units bid -Education officer, museum and case management consulted and have provided discussion with patient about resources. Will provide glucometer with script for lancets and strips on d/c  2. Lactic acidosis -Due to dehydration, resolved  3. Noncompliance -Partly secondary to social issues, but secondary gain may play a role  4. Hypertension -Continue hydralazine  5. Homelessness/social issues -Social work consult requested and they have spoke to patient.  6. Mild cognitive dysfunction versus early dementia suspected -CT head notes Chronic atrophy and small vessel ischemic changes -TSH is within normal limits.   Procedures:  none  Consultations:  Social worker  Discharge Exam: Vitals:   11/22/16 0934 11/22/16 1326  BP: (!) 165/67 (!) 150/79  Pulse: 83 81  Resp: 18 20  Temp: 98.4 F (36.9 C) 97.8 F (36.6 C)  SpO2: 100% 100%     General: Pt in nad, alert and awake Cardiovascular: rrr, no rubs Respiratory: no increased wob, no wheezes  Discharge Instructions   Discharge Instructions    Call MD for:  severe uncontrolled pain   Complete by:  As directed    Call MD for:  temperature >100.4   Complete by:  As directed    Diet - low sodium heart healthy   Complete by:  As directed    Increase activity slowly   Complete by:  As directed      Current Discharge Medication List    START taking these medications   Details  blood glucose meter kit and supplies KIT Dispense based on patient and insurance preference. Use up to four times daily as directed. (FOR ICD-9 250.00, 250.01). Qty: 1 each, Refills: 0    insulin aspart protamine- aspart (NOVOLOG MIX 70/30) (70-30) 100 UNIT/ML injection Inject 0.25 mLs (25 Units total) 2 (two) times daily with a meal into the skin. Qty: 10 mL, Refills: 0      CONTINUE these medications which have NOT CHANGED   Details  aspirin 81 MG tablet Take 1 tablet (81 mg total) by mouth daily. Qty: 30 tablet, Refills: 0    glyBURIDE (DIABETA) 5 MG tablet Take 1 tablet (5 mg total) by mouth 2 (two) times daily with a meal. Qty: 60 tablet, Refills: 0    atorvastatin (LIPITOR) 40 MG tablet Take 1 tablet (40 mg total) by mouth daily. Qty: 30 tablet, Refills: 0      STOP taking these medications     hydrALAZINE (APRESOLINE) 25 MG tablet      metFORMIN (GLUCOPHAGE) 1000 MG tablet  Allergies  Allergen Reactions  . Ibuprofen Nausea And Vomiting and Other (See Comments)    Reaction:  Bloating   . Tylenol [Acetaminophen] Nausea And Vomiting and Other (See Comments)    Reaction:  Bloating    Follow-up Information    [name], Audrea Muscat, NP. Schedule an appointment as soon as possible for a visit in 3 day(s).   Why:  Please go to [facility] for assistance with medications Contact information: [place]  [address] [phone]            The results  of significant diagnostics from this hospitalization (including imaging, microbiology, ancillary and laboratory) are listed below for reference.    Significant Diagnostic Studies: Ct Head Wo Contrast  Result Date: 11/05/2016 CLINICAL DATA:  Elevated glucose. Diabetic ketoacidosis. Seen for same frequently, with discharge from [facility] last night for same. Altered level of consciousness. EXAM: CT HEAD WITHOUT CONTRAST TECHNIQUE: Contiguous axial images were obtained from the base of the skull through the vertex without intravenous contrast. COMPARISON:  10/22/2016 FINDINGS: Brain: Diffuse cerebral atrophy. Mild ventricular dilatation consistent with central atrophy. Low-attenuation changes in the deep white matter consistent with small vessel ischemia. No evidence of acute infarction, hemorrhage, hydrocephalus, extra-axial collection or mass lesion/mass effect. Vascular: No hyperdense vessel or unexpected calcification. Skull: Normal. Negative for fracture or focal lesion. Sinuses/Orbits: Retention cyst in the right maxillary antrum. Paranasal sinuses and mastoid air cells are otherwise clear. Other: No significant change since previous study. IMPRESSION: No acute intracranial abnormalities. Chronic atrophy and small vessel ischemic changes. Electronically Signed   By: Lucienne Capers M.D.   On: 11/05/2016 01:28   Ct Angio Chest Pe W And/or Wo Contrast  Result Date: 11/05/2016 CLINICAL DATA:  56 y/o M; shortness of breath, PE suspected, high pretest probability. EXAM: CT ANGIOGRAPHY CHEST WITH CONTRAST TECHNIQUE: Multidetector CT imaging of the chest was performed using the standard protocol during bolus administration of intravenous contrast. Multiplanar CT image reconstructions and MIPs were obtained to evaluate the vascular anatomy. CONTRAST:  100 cc Isovue 370 COMPARISON:  None. FINDINGS: Cardiovascular: Satisfactory opacification of the pulmonary arteries to the segmental level. No evidence of  pulmonary embolism. Normal heart size. No pericardial effusion. Normal caliber thoracic aorta with mild calcific atherosclerosis. Mediastinum/Nodes: 11 mm nodule within left lower lobe of thyroid. No mediastinal adenopathy. Normal thoracic esophagus. Lungs/Pleura: Trace bilateral pleural effusions. Small clustered nodules and peribronchial thickening in the left lung base compatible with mild bronchitis/ bronchiolitis. No consolidation. No pneumothorax. Upper Abdomen: No acute abnormality. Musculoskeletal: No chest wall abnormality. No acute or significant osseous findings. Review of the MIP images confirms the above findings. IMPRESSION: 1. No pulmonary embolus identified. 2. Left lower lobe mild bronchitis/bronchiolitis. 3. Trace bilateral pleural effusions. Electronically Signed   By: Kristine Garbe M.D.   On: 11/05/2016 22:50   Dg Chest Portable 1 View  Result Date: 11/17/2016 CLINICAL DATA:  Initial evaluation for code STEMI.  Weakness. EXAM: PORTABLE CHEST 1 VIEW COMPARISON:  Prior radiograph from 11/05/2016. FINDINGS: Mild cardiomegaly.  Mediastinal silhouette within normal limits. Lungs hypoinflated. No focal infiltrates. No pulmonary edema or pleural effusion. No pneumothorax. No acute osseus abnormality.  Osteopenia. IMPRESSION: Shallow lung inflation with no active cardiopulmonary disease identified. Electronically Signed   By: Jeannine Boga M.D.   On: 11/17/2016 20:38   Dg Chest Port 1 View  Result Date: 11/05/2016 CLINICAL DATA:  Short of breath EXAM: PORTABLE CHEST 1 VIEW COMPARISON:  10/22/2016 FINDINGS: Heart size mildly enlarged.  Negative for heart failure. Small left effusion and minimal left lower lobe airspace disease. Right lung remains clear. IMPRESSION: Small area of airspace disease in the left costophrenic angle with a small left effusion. Possible pneumonia or pulmonary embolism. Electronically Signed   By: Franchot Gallo M.D.   On: 11/05/2016 21:14     Microbiology: Recent Results (from the past 240 hour(s))  Urine culture     Status: Abnormal   Collection Time: 11/15/16  3:03 PM  Result Value Ref Range Status   Specimen Description URINE, CLEAN CATCH  Final   Special Requests NONE  Final   Culture MULTIPLE SPECIES PRESENT, SUGGEST RECOLLECTION (A)  Final   Report Status 11/16/2016 FINAL  Final     Labs: Basic Metabolic Panel: Recent Labs  Lab 11/16/16 2231 11/17/16 0017 11/17/16 2019 11/17/16 2024 11/18/16 0317  NA 130* 136 128* 132* 137  K 4.5 3.4* 4.9 5.0 3.6  CL 92* 99* 94* 96* 106  CO2  --  28 24  --  23  GLUCOSE >700* 435* 889* >700* 175*  BUN '19 16 17 20 10  '$ CREATININE 1.00 0.95 1.28* 1.00 0.70  CALCIUM  --  9.5 9.2  --  8.4*   Liver Function Tests: Recent Labs  Lab 11/17/16 2019  AST 21  ALT 21  ALKPHOS 90  BILITOT 0.6  PROT 6.9  ALBUMIN 3.9   Recent Labs  Lab 11/17/16 2019  LIPASE 29   No results for input(s): AMMONIA in the last 168 hours. CBC: Recent Labs  Lab 11/15/16 1758 11/15/16 1911 11/16/16 2231 11/17/16 2019 11/17/16 2024  WBC  --   --   --  4.4  --   NEUTROABS  --   --   --  2.4  --   HGB 10.9* 10.5* 10.2* 9.5* 11.6*  HCT 32.0* 31.0* 30.0* 30.3* 34.0*  MCV  --   --   --  79.5  --   PLT  --   --   --  331  --    Cardiac Enzymes: No results for input(s): CKTOTAL, CKMB, CKMBINDEX, TROPONINI in the last 168 hours. BNP: BNP (last 3 results) No results for input(s): BNP in the last 8760 hours.  ProBNP (last 3 results) No results for input(s): PROBNP in the last 8760 hours.  CBG: Recent Labs  Lab 11/21/16 2228 11/21/16 2302 11/22/16 0406 11/22/16 0803 11/22/16 1154  GLUCAP 42* 90 83 178* 270*       Signed:  Velvet Bathe MD.  [facility] 11/22/2016, 2:08 PM

## 2016-11-22 NOTE — ED Triage Notes (Signed)
Pt arrived EMS with c/o hyperglycemia. Pt was released from [facility] earlier today for the same. Initial EMS CBG 454, pt given NS, repeat CBG was 473. 18 LAC. BP 152/76 P 90 SPO2 98% RA, R 16.

## 2016-11-22 NOTE — Progress Notes (Signed)
At discharge patient requesting to stay overnight because of lack of resources and [facility] After discussing with Charge RN, he was informed there is nothing we can do medically and the discharge has already been done. Pt also waiting for food that was missing from tray before he will exit the building.

## 2016-11-22 NOTE — Progress Notes (Signed)
Pt discharging but states he is unable to get into a shelter. CM encouraged him to go by and try at the shelters in [place].  Pt to [facility] [place] [name] insulin and glucose testing equipment. Pt voiced understanding.  CM provided him bus passes to get him to his destination today [facility].

## 2016-11-23 ENCOUNTER — Encounter (HOSPITAL_COMMUNITY): Payer: Self-pay

## 2016-11-23 ENCOUNTER — Emergency Department (HOSPITAL_COMMUNITY)
Admission: EM | Admit: 2016-11-23 | Discharge: 2016-11-23 | Disposition: A | Discharge status: Home / Self Care | Payer: Self-pay | Attending: Emergency Medicine | Admitting: Emergency Medicine

## 2016-11-23 ENCOUNTER — Encounter (HOSPITAL_COMMUNITY): Payer: Self-pay | Admitting: *Deleted

## 2016-11-23 DIAGNOSIS — I10 Essential (primary) hypertension: Secondary | ICD-10-CM

## 2016-11-23 DIAGNOSIS — Z79899 Other long term (current) drug therapy: Secondary | ICD-10-CM | POA: Insufficient documentation

## 2016-11-23 DIAGNOSIS — Z91199 Patient's noncompliance with other medical treatment and regimen due to unspecified reason: Secondary | ICD-10-CM

## 2016-11-23 DIAGNOSIS — I129 Hypertensive chronic kidney disease with stage 1 through stage 4 chronic kidney disease, or unspecified chronic kidney disease: Secondary | ICD-10-CM | POA: Insufficient documentation

## 2016-11-23 DIAGNOSIS — E1122 Type 2 diabetes mellitus with diabetic chronic kidney disease: Secondary | ICD-10-CM | POA: Insufficient documentation

## 2016-11-23 DIAGNOSIS — Z7982 Long term (current) use of aspirin: Secondary | ICD-10-CM | POA: Insufficient documentation

## 2016-11-23 DIAGNOSIS — F17210 Nicotine dependence, cigarettes, uncomplicated: Secondary | ICD-10-CM | POA: Insufficient documentation

## 2016-11-23 DIAGNOSIS — R739 Hyperglycemia, unspecified: Secondary | ICD-10-CM

## 2016-11-23 DIAGNOSIS — Z794 Long term (current) use of insulin: Secondary | ICD-10-CM | POA: Insufficient documentation

## 2016-11-23 DIAGNOSIS — N182 Chronic kidney disease, stage 2 (mild): Secondary | ICD-10-CM | POA: Insufficient documentation

## 2016-11-23 DIAGNOSIS — E1165 Type 2 diabetes mellitus with hyperglycemia: Secondary | ICD-10-CM | POA: Insufficient documentation

## 2016-11-23 DIAGNOSIS — D649 Anemia, unspecified: Secondary | ICD-10-CM

## 2016-11-23 DIAGNOSIS — Z9119 Patient's noncompliance with other medical treatment and regimen: Secondary | ICD-10-CM | POA: Insufficient documentation

## 2016-11-23 LAB — CBC WITH DIFFERENTIAL/PLATELET
BASOS ABS: 0 10*3/uL (ref 0.0–0.1)
Basophils Absolute: 0 10*3/uL (ref 0.0–0.1)
Basophils Relative: 0 %
Basophils Relative: 1 %
EOS ABS: 0.1 10*3/uL (ref 0.0–0.7)
EOS ABS: 0.1 10*3/uL (ref 0.0–0.7)
EOS PCT: 2 %
Eosinophils Relative: 2 %
HCT: 27.4 % — ABNORMAL LOW (ref 39.0–52.0)
HEMATOCRIT: 24.5 % — AB (ref 39.0–52.0)
Hemoglobin: 7.7 g/dL — ABNORMAL LOW (ref 13.0–17.0)
Hemoglobin: 8.6 g/dL — ABNORMAL LOW (ref 13.0–17.0)
LYMPHS ABS: 1.4 10*3/uL (ref 0.7–4.0)
LYMPHS PCT: 33 %
Lymphocytes Relative: 28 %
Lymphs Abs: 1.8 10*3/uL (ref 0.7–4.0)
MCH: 25.5 pg — AB (ref 26.0–34.0)
MCH: 25.7 pg — ABNORMAL LOW (ref 26.0–34.0)
MCHC: 31.4 g/dL (ref 30.0–36.0)
MCHC: 31.4 g/dL (ref 30.0–36.0)
MCV: 81.1 fL (ref 78.0–100.0)
MCV: 82 fL (ref 78.0–100.0)
MONO ABS: 0.5 10*3/uL (ref 0.1–1.0)
MONO ABS: 0.5 10*3/uL (ref 0.1–1.0)
MONOS PCT: 9 %
Monocytes Relative: 9 %
NEUTROS PCT: 61 %
Neutro Abs: 3 10*3/uL (ref 1.7–7.7)
Neutro Abs: 3 10*3/uL (ref 1.7–7.7)
Neutrophils Relative %: 55 %
PLATELETS: 308 10*3/uL (ref 150–400)
Platelets: 251 10*3/uL (ref 150–400)
RBC: 3.02 MIL/uL — ABNORMAL LOW (ref 4.22–5.81)
RBC: 3.34 MIL/uL — ABNORMAL LOW (ref 4.22–5.81)
RDW: 15.7 % — AB (ref 11.5–15.5)
RDW: 15.9 % — AB (ref 11.5–15.5)
WBC: 5 10*3/uL (ref 4.0–10.5)
WBC: 5.3 10*3/uL (ref 4.0–10.5)

## 2016-11-23 LAB — BASIC METABOLIC PANEL
ANION GAP: 8 (ref 5–15)
Anion gap: 9 (ref 5–15)
BUN: 15 mg/dL (ref 6–20)
BUN: 24 mg/dL — ABNORMAL HIGH (ref 6–20)
CALCIUM: 8.6 mg/dL — AB (ref 8.9–10.3)
CALCIUM: 9.4 mg/dL (ref 8.9–10.3)
CO2: 23 mmol/L (ref 22–32)
CO2: 26 mmol/L (ref 22–32)
CREATININE: 0.93 mg/dL (ref 0.61–1.24)
Chloride: 101 mmol/L (ref 101–111)
Chloride: 102 mmol/L (ref 101–111)
Creatinine, Ser: 1 mg/dL (ref 0.61–1.24)
GFR calc Af Amer: >60 mL/min (ref >60)
GFR calc Af Amer: >60 mL/min (ref >60)
GFR calc non Af Amer: >60 mL/min (ref >60)
GLUCOSE: 584 mg/dL — AB (ref 65–99)
Glucose, Bld: 404 mg/dL — ABNORMAL HIGH (ref 65–99)
Potassium: 3.7 mmol/L (ref 3.5–5.1)
Potassium: 5.1 mmol/L (ref 3.5–5.1)
Sodium: 133 mmol/L — ABNORMAL LOW (ref 135–145)
Sodium: 136 mmol/L (ref 135–145)

## 2016-11-23 LAB — CBG MONITORING, ED
GLUCOSE-CAPILLARY: 449 mg/dL — AB (ref 65–99)
GLUCOSE-CAPILLARY: 157 mg/dL — AB (ref 65–99)
GLUCOSE-CAPILLARY: 344 mg/dL — AB (ref 65–99)

## 2016-11-23 LAB — URINALYSIS, ROUTINE W REFLEX MICROSCOPIC
BACTERIA UA: NONE SEEN
BILIRUBIN URINE: NEGATIVE
Glucose, UA: >=500 mg/dL — AB
Hgb urine dipstick: NEGATIVE
Ketones, ur: NEGATIVE mg/dL
Leukocytes, UA: NEGATIVE
Nitrite: NEGATIVE
Protein, ur: NEGATIVE mg/dL
SPECIFIC GRAVITY, URINE: 1.014 (ref 1.005–1.030)
pH: 7 (ref 5.0–8.0)

## 2016-11-23 LAB — BLOOD GAS, VENOUS
Acid-Base Excess: 1.6 mmol/L (ref 0.0–2.0)
Bicarbonate: 26.5 mmol/L (ref 20.0–28.0)
O2 SAT: 70.7 %
PATIENT TEMPERATURE: 98.6
PCO2 VEN: 46.7 mmHg (ref 44.0–60.0)
pH, Ven: 7.373 (ref 7.250–7.430)
pO2, Ven: 41.9 mmHg (ref 32.0–45.0)

## 2016-11-23 LAB — POC OCCULT BLOOD, ED: FECAL OCCULT BLD: NEGATIVE

## 2016-11-23 MED ORDER — INSULIN ASPART 100 UNIT/ML ~~LOC~~ SOLN
10.0000 [IU] | Freq: Once | SUBCUTANEOUS | Status: AC
  Administered 2016-11-23: 10 [IU] via SUBCUTANEOUS
  Filled 2016-11-23: qty 1

## 2016-11-23 MED ORDER — GLYBURIDE 5 MG PO TABS: 5.0000 mg | ORAL_TABLET | Freq: Two times a day (BID) | ORAL | 0 refills | Status: DC

## 2016-11-23 MED ORDER — SODIUM CHLORIDE 0.9 % IV BOLUS (SEPSIS)
1000.0000 mL | Freq: Once | INTRAVENOUS | Status: AC
  Administered 2016-11-23: 1000 mL via INTRAVENOUS

## 2016-11-23 MED ORDER — LACTATED RINGERS IV BOLUS (SEPSIS)
2000.0000 mL | Freq: Once | INTRAVENOUS | Status: AC
  Administered 2016-11-23: 2000 mL via INTRAVENOUS

## 2016-11-23 MED ORDER — FERROUS SULFATE 325 (65 FE) MG PO TABS: 325.0000 mg | ORAL_TABLET | Freq: Every day | ORAL | 0 refills | Status: DC

## 2016-11-23 MED ORDER — INSULIN ASPART PROT & ASPART (70-30 MIX) 100 UNIT/ML ~~LOC~~ SUSP: 25.0000 [IU] | Freq: Two times a day (BID) | SUBCUTANEOUS | 0 refills | Status: DC

## 2016-11-23 MED ORDER — INSULIN ASPART 100 UNIT/ML ~~LOC~~ SOLN
10.0000 [IU] | Freq: Once | SUBCUTANEOUS | Status: AC
  Administered 2016-11-23: 10 [IU] via INTRAVENOUS
  Filled 2016-11-23: qty 1

## 2016-11-23 MED ORDER — HYDRALAZINE HCL 25 MG PO TABS
25.0000 mg | ORAL_TABLET | Freq: Once | ORAL | Status: AC
  Administered 2016-11-23: 25 mg via ORAL
  Filled 2016-11-23: qty 1

## 2016-11-23 MED ORDER — ATORVASTATIN CALCIUM 40 MG PO TABS
40.0000 mg | ORAL_TABLET | Freq: Every day | ORAL | 0 refills | Status: DC
Start: 2016-11-23 — End: 2017-01-26

## 2016-11-23 NOTE — ED Notes (Signed)
Pt wheeled back and saturated in urine. Went to restroom. Clean pants given.

## 2016-11-23 NOTE — ED Provider Notes (Signed)
[facility] DEPT Provider Note   CSN: [id number] Arrival date & time: 11/23/16  1719     History   Chief Complaint Chief Complaint  Patient presents with  . Hyperglycemia    HPI Mike [name] is a 56 y.o. male.  Pt has been in ED twice today.  Pt noncompliant with medication.   The history is provided by the patient.  Hyperglycemia  Blood sugar level PTA:  600 Severity:  Moderate Onset quality:  Gradual Timing:  Constant Progression:  Worsening Chronicity:  Recurrent Diabetes status:  Controlled with oral medications Context: noncompliance   Relieved by:  Nothing Ineffective treatments:  None tried Associated symptoms: polyuria     Past Medical History:  Diagnosis Date  . Anxiety   . Anxiety   . Chronic lower back pain   . Depression   . DKA (diabetic ketoacidoses) ([place]) 07/30/2016  . Hyperlipemia   . Hypertension   . Migraine    "last one was ~ 4 yr ago" (12/23/2014)  . Seizures ([place])    "related to pills for anxiety; if I don't take the pills I'm suppose to take I'll have them" (12/23/2014)  . Type II diabetes mellitus [facility])     Patient Active Problem List   Diagnosis Date Noted  . Lactic acidosis 11/18/2016  . Type 2 diabetes mellitus with hyperosmolarity without nonketotic hyperglycemic-hyperosmolar coma [facility]) ([place]) 11/06/2016  . Normocytic anemia 11/06/2016  . HLD (hyperlipidemia) 11/06/2016  . Adjustment disorder with other symptoms 11/05/2016  . CKD (chronic kidney disease) 10/22/2016  . Uncontrolled type 2 diabetes mellitus with hyperosmolar nonketotic hyperglycemia ([place]) 08/24/2016  . Essential hypertension 08/24/2016  . Acute ischemic stroke ([place])   . Acute renal failure superimposed on stage 2 chronic kidney disease ([place]) 07/31/2016  . Acute metabolic encephalopathy 76/16/0737  . DKA (diabetic ketoacidoses) ([place]) 06/24/2016  . Acute kidney injury (nontraumatic) ([place]) 06/24/2016  . Protein-calorie  malnutrition, severe 12/24/2014  . Chest pain, pleuritic 12/23/2014  . Homelessness 12/23/2014  . Hypertensive urgency 12/23/2014  . DM (diabetes mellitus) ([place]) 12/23/2014  . Intermittent palpitations 12/23/2014  . Tobacco abuse 12/23/2014  . Pleuritic chest pain 12/23/2014  . Abnormal EKG 12/23/2014  . Cough   . Type II diabetes mellitus with renal manifestations [facility])     Past Surgical History:  Procedure Laterality Date  . NO PAST SURGERIES         Home Medications    Prior to Admission medications   Medication Sig Start Date End Date Taking? Authorizing Provider  aspirin 81 MG tablet Take 1 tablet (81 mg total) by mouth daily. 11/04/16 12/04/16 Yes Doreatha Lew, MD  atorvastatin (LIPITOR) 40 MG tablet Take 1 tablet (40 mg total) daily by mouth. 11/23/16  Yes Ward, Kristen N, DO  ferrous sulfate 325 (65 FE) MG tablet Take 1 tablet (325 mg total) daily by mouth. 11/23/16  Yes Ward, Kristen N, DO  glyBURIDE (DIABETA) 5 MG tablet Take 1 tablet (5 mg total) 2 (two) times daily with a meal by mouth. 11/23/16  Yes Ward, Cyril Mourning N, DO  insulin aspart protamine- aspart (NOVOLOG MIX 70/30) (70-30) 100 UNIT/ML injection Inject 0.25 mLs (25 Units total) 2 (two) times daily with a meal into the skin. 11/23/16  Yes Ward, Delice Bison, DO  blood glucose meter kit and supplies KIT Dispense based on patient and insurance preference. Use up to four times daily as directed. (FOR ICD-9 250.00, 250.01). 11/22/16   Velvet Bathe, MD  Family History Family History  Problem Relation Age of Onset  . Diabetes Mellitus II Mother   . Diabetes Mellitus II Father     Social History Social History   Tobacco Use  . Smoking status: Current Every Day Smoker    Packs/day: 0.10    Years: 40.00    Pack years: 4.00    Types: Cigarettes  . Smokeless tobacco: Never Used  Substance Use Topics  . Alcohol use: Yes    Alcohol/week: 1.2 oz    Types: 2 Cans of beer per week  . Drug use: No    Comment:  12/23/2014 "stopped ~ 10 yrs ago"     Allergies   Ibuprofen and Tylenol [acetaminophen]   Review of Systems Review of Systems  Endocrine: Positive for polyuria.  All other systems reviewed and are negative.    Physical Exam Updated Vital Signs BP (!) 189/84 (BP Location: Left Arm)   Pulse 78   Temp 99 F (37.2 C) (Oral)   Resp 18   SpO2 100%   Physical Exam  Constitutional: He appears well-developed and well-nourished.  HENT:  Head: Normocephalic and atraumatic.  Right Ear: External ear normal.  Left Ear: External ear normal.  Eyes: Conjunctivae are normal.  Neck: Neck supple.  Cardiovascular: Normal rate and regular rhythm.  No murmur heard. Pulmonary/Chest: Effort normal and breath sounds normal. No respiratory distress.  Abdominal: Soft. There is no tenderness.  Musculoskeletal: He exhibits no edema.  Neurological: He is alert.  Skin: Skin is warm and dry.  Psychiatric: He has a normal mood and affect.  Nursing note and vitals reviewed.    ED Treatments / Results  Labs (all labs ordered are listed, but only abnormal results are displayed) Labs Reviewed  CBC WITH DIFFERENTIAL/PLATELET - Abnormal; Notable for the following components:      Result Value   RBC 3.34 (*)    Hemoglobin 8.6 (*)    HCT 27.4 (*)    MCH 25.7 (*)    RDW 15.9 (*)    All other components within normal limits  BASIC METABOLIC PANEL - Abnormal; Notable for the following components:   Glucose, Bld 584 (*)    BUN 24 (*)    All other components within normal limits  CBG MONITORING, ED - Abnormal; Notable for the following components:   Glucose-Capillary >600 (*)    All other components within normal limits  CBG MONITORING, ED - Abnormal; Notable for the following components:   Glucose-Capillary 344 (*)    All other components within normal limits  BLOOD GAS, VENOUS  URINALYSIS, ROUTINE W REFLEX MICROSCOPIC    EKG  EKG Interpretation None       Radiology No results  found.  Procedures Procedures (including critical care time)  Medications Ordered in ED Medications  lactated ringers bolus 2,000 mL (0 mLs Intravenous Stopped 11/23/16 2017)  insulin aspart (novoLOG) injection 10 Units (10 Units Subcutaneous Given 11/23/16 1858)     Initial Impression / Assessment and Plan / ED Course  I have reviewed the triage vital signs and the nursing notes.  Pertinent labs & imaging results that were available during my care of the patient were reviewed by me and considered in my medical decision making (see chart for details).     Pt given iv fluids.  Insulin 10 units subq.   Pt advised he needs to get his medications from [facility].    Final Clinical Impressions(s) / ED Diagnoses   Final diagnoses:  Hyperglycemia  Hypertension, unspecified type    ED Discharge Orders    None    An After Visit Summary was printed and given to the patient.    Sidney Ace 11/23/16 2117    Carmin Muskrat, MD 11/23/16 [phone]

## 2016-11-23 NOTE — ED Notes (Signed)
Called report x1. 

## 2016-11-23 NOTE — ED Notes (Signed)
Bed: WTR7 Expected date:  Expected time:  Means of arrival:  Comments: 

## 2016-11-23 NOTE — ED Provider Notes (Signed)
[facility] EMERGENCY DEPARTMENT Provider Note   CSN: [id number] Arrival date & time: 11/23/16  3151     History   Chief Complaint Chief Complaint  Patient presents with  . Hypertension    HPI Kala Tavenner is a 56 y.o. male.  HPI   56 year old male with history of anxiety, depression, diabetes, seizure, hypertension, Medical noncompliance, homelessness presenting requesting for management of his hypertension.  Patient was seen early this morning in the ER for the same complaint.Is voicing that he has high blood sugar.  He had a complete workup for his hyperglycemia.  He received IV fluid and IV insulin.  He did not have evidence of DKA.  He was hemodynamically stable.  He did have a drop in his hemoglobin from 11.6-7.7 which was thought to be dilutional from IV fluid from recent admission.  He did receive iron tablets.  It appears to Case management has try to help him with his medication.  Currently patient requesting for something to eat.  States that he had pain in the waiting room since he was discharged early this morning.  He is homeless.  No other complaint.     Past Medical History:  Diagnosis Date  . Anxiety   . Anxiety   . Chronic lower back pain   . Depression   . DKA (diabetic ketoacidoses) ([place]) 07/30/2016  . Hyperlipemia   . Hypertension   . Migraine    "last one was ~ 4 yr ago" (12/23/2014)  . Seizures ([place])    "related to pills for anxiety; if I don't take the pills I'm suppose to take I'll have them" (12/23/2014)  . Type II diabetes mellitus [facility])     Patient Active Problem List   Diagnosis Date Noted  . Lactic acidosis 11/18/2016  . Type 2 diabetes mellitus with hyperosmolarity without nonketotic hyperglycemic-hyperosmolar coma [facility]) ([place]) 11/06/2016  . Normocytic anemia 11/06/2016  . HLD (hyperlipidemia) 11/06/2016  . Adjustment disorder with other symptoms 11/05/2016  . CKD (chronic kidney disease) 10/22/2016  .  Uncontrolled type 2 diabetes mellitus with hyperosmolar nonketotic hyperglycemia ([place]) 08/24/2016  . Essential hypertension 08/24/2016  . Acute ischemic stroke ([place])   . Acute renal failure superimposed on stage 2 chronic kidney disease ([place]) 07/31/2016  . Acute metabolic encephalopathy 76/16/0737  . DKA (diabetic ketoacidoses) ([place]) 06/24/2016  . Acute kidney injury (nontraumatic) ([place]) 06/24/2016  . Protein-calorie malnutrition, severe 12/24/2014  . Chest pain, pleuritic 12/23/2014  . Homelessness 12/23/2014  . Hypertensive urgency 12/23/2014  . DM (diabetes mellitus) ([place]) 12/23/2014  . Intermittent palpitations 12/23/2014  . Tobacco abuse 12/23/2014  . Pleuritic chest pain 12/23/2014  . Abnormal EKG 12/23/2014  . Cough   . Type II diabetes mellitus with renal manifestations [facility])     Past Surgical History:  Procedure Laterality Date  . NO PAST SURGERIES         Home Medications    Prior to Admission medications   Medication Sig Start Date End Date Taking? Authorizing Provider  aspirin 81 MG tablet Take 1 tablet (81 mg total) by mouth daily. 11/04/16 12/04/16  Doreatha Lew, MD  atorvastatin (LIPITOR) 40 MG tablet Take 1 tablet (40 mg total) daily by mouth. 11/23/16   Ward, Delice Bison, DO  blood glucose meter kit and supplies KIT Dispense based on patient and insurance preference. Use up to four times daily as directed. (FOR ICD-9 250.00, 250.01). 11/22/16   Velvet Bathe, MD  ferrous sulfate 325 (65 FE) MG tablet  Take 1 tablet (325 mg total) daily by mouth. 11/23/16   Ward, Delice Bison, DO  glyBURIDE (DIABETA) 5 MG tablet Take 1 tablet (5 mg total) 2 (two) times daily with a meal by mouth. 11/23/16   Ward, Delice Bison, DO  insulin aspart protamine- aspart (NOVOLOG MIX 70/30) (70-30) 100 UNIT/ML injection Inject 0.25 mLs (25 Units total) 2 (two) times daily with a meal into the skin. 11/23/16   Ward, Delice Bison, DO    Family History Family History  Problem Relation Age of  Onset  . Diabetes Mellitus II Mother   . Diabetes Mellitus II Father     Social History Social History   Tobacco Use  . Smoking status: Current Every Day Smoker    Packs/day: 0.10    Years: 40.00    Pack years: 4.00    Types: Cigarettes  . Smokeless tobacco: Never Used  Substance Use Topics  . Alcohol use: Yes    Alcohol/week: 1.2 oz    Types: 2 Cans of beer per week  . Drug use: No    Comment: 12/23/2014 "stopped ~ 10 yrs ago"     Allergies   Ibuprofen and Tylenol [acetaminophen]   Review of Systems Review of Systems  All other systems reviewed and are negative.    Physical Exam Updated Vital Signs Ht '6\' 4"'$  (1.93 m)   Wt 71.7 kg (158 lb)   SpO2 100%   BMI 19.23 kg/m    Physical Exam  Constitutional: He appears well-developed and well-nourished. No distress.  Patient appears disheveled  HENT:  Head: Atraumatic.  Eyes: Conjunctivae are normal.  Neck: Neck supple.  Cardiovascular: Normal rate and regular rhythm.  Pulmonary/Chest: Effort normal and breath sounds normal.  Abdominal: Soft. There is no tenderness.  Neurological: He is alert.  Skin: No rash noted.  Psychiatric: He has a normal mood and affect.  Nursing note and vitals reviewed.    ED Treatments / Results  Labs (all labs ordered are listed, but only abnormal results are displayed) Labs Reviewed - No data to display  EKG  EKG Interpretation None       Radiology No results found.  Procedures Procedures (including critical care time)  Medications Ordered in ED Medications - No data to display   Initial Impression / Assessment and Plan / ED Course  I have reviewed the triage vital signs and the nursing notes.  Pertinent labs & imaging results that were available during my care of the patient were reviewed by me and considered in my medical decision making (see chart for details).     BP (!) 172/85   Pulse 82   Temp 98.9 F (37.2 C) (Oral)   Ht '6\' 4"'$  (1.93 m)   Wt 71.7 kg  (158 lb)   SpO2 100%   BMI 19.23 kg/m     Final Clinical Impressions(s) / ED Diagnoses   Final diagnoses:  Essential hypertension  Medical non-compliance    ED Discharge Orders    None     11:51 AM Pt is homeless.  Medical non complaint. Was seen and evaluate by our provider earlier this AM.  He is here requesting for food to eat.  Food given.  Does have evidence of HTN, non compliant.  Resources provided.  Otherwise no acute emergent condition identified.  Stable for discharge.    Domenic Moras, PA-C 11/23/16 1224  Veryl Speak, MD 11/23/16 [phone]

## 2016-11-23 NOTE — ED Notes (Signed)
[place] sandwich and drink given. EDP at bedside

## 2016-11-23 NOTE — ED Provider Notes (Signed)
TIME SEEN: 4:10 AM  CHIEF COMPLAINT: Hyperglycemia   HPI: Patient is a 56 year old male with history of hypertension, hyperlipidemia, insulin-dependent diabetes with medical noncompliance who presents to the emergency department with elevated blood sugars.  He denies having any symptoms.  Denies any pain, fevers, cough, vomiting or diarrhea.  States he has been sitting in the waiting room and eating candy.  He has been offered help receiving his medications several times in the past and states that he either loses them or cannot afford them.  ROS: See HPI Constitutional: no fever  Eyes: no drainage  ENT: no runny nose   Cardiovascular:  no chest pain  Resp: no SOB  GI: no vomiting GU: no dysuria Integumentary: no rash  Allergy: no hives  Musculoskeletal: no leg swelling  Neurological: no slurred speech ROS otherwise negative  PAST MEDICAL HISTORY/PAST SURGICAL HISTORY:  Past Medical History:  Diagnosis Date  . Anxiety   . Anxiety   . Chronic lower back pain   . Depression   . DKA (diabetic ketoacidoses) ([place]) 07/30/2016  . Hyperlipemia   . Hypertension   . Migraine    "last one was ~ 4 yr ago" (12/23/2014)  . Seizures ([place])    "related to pills for anxiety; if I don't take the pills I'm suppose to take I'll have them" (12/23/2014)  . Type II diabetes mellitus ([facility])     MEDICATIONS:  Prior to Admission medications   Medication Sig Start Date End Date Taking? Authorizing Provider  aspirin 81 MG tablet Take 1 tablet (81 mg total) by mouth daily. 11/04/16 12/04/16  Doreatha Lew, MD  atorvastatin (LIPITOR) 40 MG tablet Take 1 tablet (40 mg total) by mouth daily. Patient not taking: Reported on 11/18/2016 11/04/16   Doreatha Lew, MD  blood glucose meter kit and supplies KIT Dispense based on patient and insurance preference. Use up to four times daily as directed. (FOR ICD-9 250.00, 250.01). 11/22/16   Velvet Bathe, MD  glyBURIDE (DIABETA) 5 MG tablet Take 1 tablet (5  mg total) by mouth 2 (two) times daily with a meal. 11/14/16   Long, Wonda Olds, MD  insulin aspart protamine- aspart (NOVOLOG MIX 70/30) (70-30) 100 UNIT/ML injection Inject 0.25 mLs (25 Units total) 2 (two) times daily with a meal into the skin. 11/22/16   Velvet Bathe, MD    ALLERGIES:  Allergies  Allergen Reactions  . Ibuprofen Nausea And Vomiting and Other (See Comments)    Reaction:  Bloating   . Tylenol [Acetaminophen] Nausea And Vomiting and Other (See Comments)    Reaction:  Bloating     SOCIAL HISTORY:  Social History   Tobacco Use  . Smoking status: Current Every Day Smoker    Packs/day: 0.10    Years: 40.00    Pack years: 4.00    Types: Cigarettes  . Smokeless tobacco: Never Used  Substance Use Topics  . Alcohol use: Yes    Alcohol/week: 1.2 oz    Types: 2 Cans of beer per week    FAMILY HISTORY: Family History  Problem Relation Age of Onset  . Diabetes Mellitus II Mother   . Diabetes Mellitus II Father     EXAM: BP (!) 166/79   Pulse 80   Temp 99.3 F (37.4 C)   Resp 18   Ht '6\' 4"'$  (1.93 m)   Wt 71.7 kg (158 lb)   SpO2 100%   BMI 19.23 kg/m  CONSTITUTIONAL: Alert and oriented and responds appropriately to  questions.  Chronically ill appearing, in NAD HEAD: Normocephalic EYES: Conjunctivae clear, pupils appear equal, EOMI ENT: normal nose; moist mucous membranes NECK: Supple, no meningismus, no nuchal rigidity, no LAD  CARD: RRR; S1 and S2 appreciated; no murmurs, no clicks, no rubs, no gallops RESP: Normal chest excursion without splinting or tachypnea; breath sounds clear and equal bilaterally; no wheezes, no rhonchi, no rales, no hypoxia or respiratory distress, speaking full sentences ABD/GI: Normal bowel sounds; non-distended; soft, non-tender, no rebound, no guarding, no peritoneal signs, no hepatosplenomegaly RECTAL:  Normal rectal tone, no gross blood or melena, guaiac negative, no hemorrhoids appreciated, nontender rectal exam, no fecal  impaction BACK:  The back appears normal and is non-tender to palpation, there is no CVA tenderness EXT: Normal ROM in all joints; non-tender to palpation; no edema; normal capillary refill; no cyanosis, no calf tenderness or swelling    SKIN: Normal color for age and race; warm; no rash NEURO: Moves all extremities equally PSYCH: The patient's mood and manner are appropriate. Poor grooming.  MEDICAL DECISION MAKING: Pt here With hyperglycemia.  Will give IV fluids and IV insulin.  Will check labs and urine to ensure patient not in DKA.  No symptoms currently.  Hemodynamically stable.  ED PROGRESS:  Patient's labs do not suggest DKA.  His hemoglobin has dropped from 11.6 to 7.7.  This may be dilutional from IV fluids from recent admission for hyperglycemia.  He denies any injury or bleeding.  No vomiting blood.  No bloody stool or melena.  He is guaiac negative.  Blood was obtained from a peripheral stick and not often IV line.  He is HD stable.  He does not need an emergent transfusion.  Will start him on iron tablets.  6:40 AM  Patient's blood glucose has improved to 157.  I have given him a refill of his NovoLog 70/30 as well as his glyburide, atorvastatin and given him a prescription for iron tablets.  It appears case management has tried to help him with an outpatient provider and he has been seen at the [facility] before and they have tried to help him with his medications.  I honestly do not feel at this time we should involve case management again as I do not think that this truly helps the patient is a feel that he is noncompliant for other reasons and he may have secondary gain by coming to the hospital since he is homeless there also may be underlying psychiatric illness as well.   At this time, I do not feel there is any life-threatening condition present. I have reviewed and discussed all results (EKG, imaging, lab, urine as appropriate) and exam findings with patient/family. I have reviewed  nursing notes and appropriate previous records.  I feel the patient is safe to be discharged home without further emergent workup and can continue workup as an outpatient as needed. Discussed usual and customary return precautions. Patient/family verbalize understanding and are comfortable with this plan.  Outpatient follow-up has been provided if needed. All questions have been answered.    Brookes Craine, Delice Bison, DO 11/23/16 [phone]

## 2016-11-23 NOTE — Discharge Instructions (Signed)
Follow up with your PCP.  Continue to take your medications as prescribed.

## 2016-11-23 NOTE — ED Triage Notes (Addendum)
Patient brought in by EMS with complaints of hyperglycemia. Patient eating popcorn out of his bag during triage, despite this nurse asking him to stop. Per EMS, patient CBG greater than 600. Patient awake and alert in triage. [facility] for the same complaint.

## 2016-11-23 NOTE — ED Notes (Signed)
Patient provided to con-carb tray per md order.

## 2016-11-23 NOTE — ED Notes (Signed)
Pt states he was just discharged from ED but he doesn't want to sit in the waiting room any longer so he is checking back in for "hypertension". Pt states "but I don't want to be discharged tonight. I don't want to be released until tomorrow."

## 2016-11-23 NOTE — ED Triage Notes (Signed)
Per Pt, Pt is coming from the waiting room where he has been since he was last discharged. Pt reports having HTN and requesting to be seen again. Pt has a strong smell of urine and is Alert to self, place, and situation, but reports he is 56 yo. [facility]

## 2016-11-23 NOTE — ED Notes (Signed)
Pt has urinated all over floor. Cleaned and urinal given.

## 2016-11-23 NOTE — ED Provider Notes (Signed)
[facility] DEPT Provider Note   CSN: [id number] Arrival date & time: 11/23/16  2250     History   Chief Complaint No chief complaint on file.   HPI Noor Bingaman is a 56 y.o. male.  56 yo M with a cc of hypertension.  This been an ongoing issue.  When I asked him why he returned because he was just discharged 5 minutes ago he was unsure.  He told me he was checking back in.  He is very adamant about that but was not sure why.  He eventually said that his blood pressure has been high.  I asked him what the result was when he was discharged 5 minutes ago and he was unsure.  He told me that he did concern him though asked him what symptoms he was having and he said none.  He denies chest pain shortness of breath abdominal pain numbness or weakness.      Past Medical History:  Diagnosis Date  . Anxiety   . Anxiety   . Chronic lower back pain   . Depression   . DKA (diabetic ketoacidoses) ([place]) 07/30/2016  . Hyperlipemia   . Hypertension   . Migraine    "last one was ~ 4 yr ago" (12/23/2014)  . Seizures ([place])    "related to pills for anxiety; if I don't take the pills I'm suppose to take I'll have them" (12/23/2014)  . Type II diabetes mellitus [facility])     Patient Active Problem List   Diagnosis Date Noted  . Lactic acidosis 11/18/2016  . Type 2 diabetes mellitus with hyperosmolarity without nonketotic hyperglycemic-hyperosmolar coma [facility]) ([place]) 11/06/2016  . Normocytic anemia 11/06/2016  . HLD (hyperlipidemia) 11/06/2016  . Adjustment disorder with other symptoms 11/05/2016  . CKD (chronic kidney disease) 10/22/2016  . Uncontrolled type 2 diabetes mellitus with hyperosmolar nonketotic hyperglycemia ([place]) 08/24/2016  . Essential hypertension 08/24/2016  . Acute ischemic stroke ([place])   . Acute renal failure superimposed on stage 2 chronic kidney disease ([place]) 07/31/2016  . Acute metabolic encephalopathy 28/31/5176  . DKA (diabetic  ketoacidoses) ([place]) 06/24/2016  . Acute kidney injury (nontraumatic) ([place]) 06/24/2016  . Protein-calorie malnutrition, severe 12/24/2014  . Chest pain, pleuritic 12/23/2014  . Homelessness 12/23/2014  . Hypertensive urgency 12/23/2014  . DM (diabetes mellitus) ([place]) 12/23/2014  . Intermittent palpitations 12/23/2014  . Tobacco abuse 12/23/2014  . Pleuritic chest pain 12/23/2014  . Abnormal EKG 12/23/2014  . Cough   . Type II diabetes mellitus with renal manifestations [facility])     Past Surgical History:  Procedure Laterality Date  . NO PAST SURGERIES         Home Medications    Prior to Admission medications   Medication Sig Start Date End Date Taking? Authorizing Provider  aspirin 81 MG tablet Take 1 tablet (81 mg total) by mouth daily. 11/04/16 12/04/16  Doreatha Lew, MD  atorvastatin (LIPITOR) 40 MG tablet Take 1 tablet (40 mg total) daily by mouth. 11/23/16   Ward, Delice Bison, DO  blood glucose meter kit and supplies KIT Dispense based on patient and insurance preference. Use up to four times daily as directed. (FOR ICD-9 250.00, 250.01). 11/22/16   Velvet Bathe, MD  ferrous sulfate 325 (65 FE) MG tablet Take 1 tablet (325 mg total) daily by mouth. 11/23/16   Ward, Delice Bison, DO  glyBURIDE (DIABETA) 5 MG tablet Take 1 tablet (5 mg total) 2 (two) times daily with a meal by mouth.  11/23/16   Ward, Delice Bison, DO  insulin aspart protamine- aspart (NOVOLOG MIX 70/30) (70-30) 100 UNIT/ML injection Inject 0.25 mLs (25 Units total) 2 (two) times daily with a meal into the skin. 11/23/16   Ward, Delice Bison, DO    Family History Family History  Problem Relation Age of Onset  . Diabetes Mellitus II Mother   . Diabetes Mellitus II Father     Social History Social History   Tobacco Use  . Smoking status: Current Every Day Smoker    Packs/day: 0.10    Years: 40.00    Pack years: 4.00    Types: Cigarettes  . Smokeless tobacco: Never Used  Substance Use Topics  . Alcohol use:  Yes    Alcohol/week: 1.2 oz    Types: 2 Cans of beer per week  . Drug use: No    Comment: 12/23/2014 "stopped ~ 10 yrs ago"     Allergies   Ibuprofen and Tylenol [acetaminophen]   Review of Systems Review of Systems  Constitutional: Negative for chills and fever.  HENT: Negative for congestion and facial swelling.   Eyes: Negative for discharge and visual disturbance.  Respiratory: Negative for shortness of breath.   Cardiovascular: Negative for chest pain and palpitations.  Gastrointestinal: Negative for abdominal pain, diarrhea and vomiting.  Musculoskeletal: Negative for arthralgias and myalgias.  Skin: Negative for color change and rash.  Neurological: Negative for tremors, syncope and headaches.  Psychiatric/Behavioral: Negative for confusion and dysphoric mood.     Physical Exam Updated Vital Signs BP (!) 173/80 (BP Location: Right Arm)   Pulse 87   Temp 98.5 F (36.9 C) (Oral)   Resp 18   Ht '6\' 4"'$  (1.93 m)   Wt 77.1 kg (170 lb)   SpO2 99%   BMI 20.69 kg/m   Physical Exam  Constitutional: He is oriented to person, place, and time. He appears well-developed and well-nourished.  HENT:  Head: Normocephalic and atraumatic.  Eyes: EOM are normal. Pupils are equal, round, and reactive to light.  Neck: Normal range of motion. Neck supple. No JVD present.  Cardiovascular: Normal rate and regular rhythm. Exam reveals no gallop and no friction rub.  No murmur heard. Pulmonary/Chest: No respiratory distress. He has no wheezes.  Abdominal: He exhibits no distension and no mass. There is no tenderness. There is no rebound and no guarding.  Musculoskeletal: Normal range of motion.  Neurological: He is alert and oriented to person, place, and time.  Skin: No rash noted. No pallor.  Psychiatric: He has a normal mood and affect. His behavior is normal.  Nursing note and vitals reviewed.    ED Treatments / Results  Labs (all labs ordered are listed, but only abnormal  results are displayed) Labs Reviewed - No data to display  EKG  EKG Interpretation None       Radiology No results found.  Procedures Procedures (including critical care time)  Medications Ordered in ED Medications - No data to display   Initial Impression / Assessment and Plan / ED Course  I have reviewed the triage vital signs and the nursing notes.  Pertinent labs & imaging results that were available during my care of the patient were reviewed by me and considered in my medical decision making (see chart for details).     56 yo M with a chief complaint of hypertension.  This is the patient's third visit here today.  He has already had labs that are unremarkable.  He has  not even sure why he is checking back in at least a description to me.  I am concerned he is here for secondary gain.  He is Artie been medically cleared twice.  He is currently having no symptoms.  Discharge home.  Patient asymptomatic with no noted s/s of end organ damage.  No chest pain, diaphoresis, nausea or other acs symptoms.  No headache or neurologic complaints,  no unequal pulses, normal pulse ox without rales or sob.  Feel this is unlikely to be a Hypertensive Emergency and recent studies suggest no benefit for inpatient admission.  There are also no studies to my knowledge suggesting that patients with hypertensive urgency have increased risk for end organ disease.The patient will follow up closely with their PCP.  Compliance with their medication stressed.    Lowell Guitar, Cicero Duck EH, et al. Characteristics and outcomes of patients presenting with hypertensive urgency in the office setting. JAMA Intern Med. 2016 Jul 1; 176(7): 981-8.    11:12 PM:  I have discussed the diagnosis/risks/treatment options with the patient and family and believe the pt to be eligible for discharge home to follow-up with PCP. We also discussed returning to the ED immediately if new or worsening sx occur. We  discussed the sx which are most concerning (e.g., sudden worsening pain, fever, inability to tolerate by mouth) that necessitate immediate return. Medications administered to the patient during their visit and any new prescriptions provided to the patient are listed below.  Medications given during this visit Medications - No data to display   The patient appears reasonably screen and/or stabilized for discharge and I doubt any other medical condition or other [facility] requiring further screening, evaluation, or treatment in the ED at this time prior to discharge.    Final Clinical Impressions(s) / ED Diagnoses   Final diagnoses:  Essential hypertension    ED Discharge Orders    None       Deno Etienne, DO 11/23/16 2313

## 2016-11-23 NOTE — ED Notes (Signed)
Bed: [id number] Expected date:  Expected time:  Means of arrival:  Comments: EMS: hyperglycemia AMS

## 2016-11-23 NOTE — ED Notes (Signed)
Pt ambulatory and independent at discharge.  Verbalized understanding of discharge instructions 

## 2016-11-24 ENCOUNTER — Emergency Department (HOSPITAL_COMMUNITY)
Admission: EM | Admit: 2016-11-24 | Discharge: 2016-11-24 | Disposition: A | Discharge status: Home / Self Care | Payer: Self-pay | Attending: Emergency Medicine | Admitting: Emergency Medicine

## 2016-11-24 ENCOUNTER — Other Ambulatory Visit: Payer: Self-pay

## 2016-11-24 ENCOUNTER — Encounter (HOSPITAL_COMMUNITY): Payer: Self-pay | Admitting: Emergency Medicine

## 2016-11-24 ENCOUNTER — Emergency Department (HOSPITAL_COMMUNITY)
Admission: EM | Admit: 2016-11-24 | Discharge: 2016-11-24 | Discharge status: Against Medical Advice | Payer: Self-pay | Attending: Emergency Medicine | Admitting: Emergency Medicine

## 2016-11-24 ENCOUNTER — Encounter (HOSPITAL_COMMUNITY): Payer: Self-pay

## 2016-11-24 DIAGNOSIS — Z7982 Long term (current) use of aspirin: Secondary | ICD-10-CM | POA: Insufficient documentation

## 2016-11-24 DIAGNOSIS — Z9119 Patient's noncompliance with other medical treatment and regimen: Secondary | ICD-10-CM | POA: Insufficient documentation

## 2016-11-24 DIAGNOSIS — Z79899 Other long term (current) drug therapy: Secondary | ICD-10-CM | POA: Insufficient documentation

## 2016-11-24 DIAGNOSIS — Z794 Long term (current) use of insulin: Secondary | ICD-10-CM | POA: Insufficient documentation

## 2016-11-24 DIAGNOSIS — R632 Polyphagia: Secondary | ICD-10-CM | POA: Insufficient documentation

## 2016-11-24 DIAGNOSIS — F17210 Nicotine dependence, cigarettes, uncomplicated: Secondary | ICD-10-CM | POA: Insufficient documentation

## 2016-11-24 DIAGNOSIS — R350 Frequency of micturition: Secondary | ICD-10-CM | POA: Insufficient documentation

## 2016-11-24 DIAGNOSIS — E1165 Type 2 diabetes mellitus with hyperglycemia: Secondary | ICD-10-CM | POA: Insufficient documentation

## 2016-11-24 DIAGNOSIS — R69 Illness, unspecified: Secondary | ICD-10-CM | POA: Insufficient documentation

## 2016-11-24 DIAGNOSIS — N182 Chronic kidney disease, stage 2 (mild): Secondary | ICD-10-CM | POA: Insufficient documentation

## 2016-11-24 DIAGNOSIS — Z5321 Procedure and treatment not carried out due to patient leaving prior to being seen by health care provider: Secondary | ICD-10-CM | POA: Insufficient documentation

## 2016-11-24 DIAGNOSIS — I129 Hypertensive chronic kidney disease with stage 1 through stage 4 chronic kidney disease, or unspecified chronic kidney disease: Secondary | ICD-10-CM | POA: Insufficient documentation

## 2016-11-24 DIAGNOSIS — E1122 Type 2 diabetes mellitus with diabetic chronic kidney disease: Secondary | ICD-10-CM | POA: Insufficient documentation

## 2016-11-24 DIAGNOSIS — Z91199 Patient's noncompliance with other medical treatment and regimen due to unspecified reason: Secondary | ICD-10-CM

## 2016-11-24 DIAGNOSIS — I10 Essential (primary) hypertension: Secondary | ICD-10-CM

## 2016-11-24 DIAGNOSIS — F419 Anxiety disorder, unspecified: Secondary | ICD-10-CM | POA: Insufficient documentation

## 2016-11-24 DIAGNOSIS — Z9114 Patient's other noncompliance with medication regimen: Secondary | ICD-10-CM

## 2016-11-24 DIAGNOSIS — R631 Polydipsia: Secondary | ICD-10-CM | POA: Insufficient documentation

## 2016-11-24 DIAGNOSIS — R739 Hyperglycemia, unspecified: Secondary | ICD-10-CM

## 2016-11-24 LAB — I-STAT CHEM 8, ED
BUN: 18 mg/dL (ref 6–20)
BUN: 28 mg/dL — AB (ref 6–20)
CHLORIDE: 101 mmol/L (ref 101–111)
CREATININE: 0.6 mg/dL — AB (ref 0.61–1.24)
Calcium, Ion: 1.22 mmol/L (ref 1.15–1.40)
Calcium, Ion: 1.21 mmol/L (ref 1.15–1.40)
Chloride: 103 mmol/L (ref 101–111)
Creatinine, Ser: 0.8 mg/dL (ref 0.61–1.24)
Glucose, Bld: 386 mg/dL — ABNORMAL HIGH (ref 65–99)
Glucose, Bld: 609 mg/dL (ref 65–99)
HCT: 33 % — ABNORMAL LOW (ref 39.0–52.0)
HEMATOCRIT: 31 % — AB (ref 39.0–52.0)
HEMOGLOBIN: 11.2 g/dL — AB (ref 13.0–17.0)
HEMOGLOBIN: 10.5 g/dL — AB (ref 13.0–17.0)
POTASSIUM: 3.9 mmol/L (ref 3.5–5.1)
POTASSIUM: 4.7 mmol/L (ref 3.5–5.1)
Sodium: 138 mmol/L (ref 135–145)
Sodium: 138 mmol/L (ref 135–145)
TCO2: 27 mmol/L (ref 22–32)
TCO2: 29 mmol/L (ref 22–32)

## 2016-11-24 LAB — CBG MONITORING, ED
GLUCOSE-CAPILLARY: 504 mg/dL — AB (ref 65–99)
Glucose-Capillary: 195 mg/dL — ABNORMAL HIGH (ref 65–99)

## 2016-11-24 MED ORDER — INSULIN ASPART PROT & ASPART (70-30 MIX) 100 UNIT/ML ~~LOC~~ SUSP: 25.0000 [IU] | Freq: Two times a day (BID) | SUBCUTANEOUS | 11 refills | Status: DC

## 2016-11-24 MED ORDER — HYDRALAZINE HCL 25 MG PO TABS
25.0000 mg | ORAL_TABLET | Freq: Once | ORAL | Status: AC
  Administered 2016-11-24: 25 mg via ORAL
  Filled 2016-11-24: qty 1

## 2016-11-24 MED ORDER — INSULIN ASPART 100 UNIT/ML ~~LOC~~ SOLN
20.0000 [IU] | Freq: Once | SUBCUTANEOUS | Status: AC
  Administered 2016-11-24: 20 [IU] via SUBCUTANEOUS
  Filled 2016-11-24: qty 1

## 2016-11-24 MED ORDER — GLYBURIDE 5 MG PO TABS: 5.0000 mg | ORAL_TABLET | Freq: Two times a day (BID) | ORAL | 0 refills | Status: DC

## 2016-11-24 NOTE — ED Provider Notes (Signed)
Assumed care at 7 AM.  Briefly, patient is a 56 year old here with recurrent ED visits for hyperglycemia here with the same in the setting of insulin nonadherence.  Blood sugar over 500 on arrival.  This has improved to less than 400 on repeat.  I sent a chemistry panel which shows no evidence of DKA.  He is hypertensive here, but has no signs of hypertensive emergency and will be given his p.o. antihypertensives.  Will discharge with continued encouragement to follow-up with social work and his primary care doctor.   Shaune PollackIsaacs, Jazmin Vensel, MD 11/24/16 [id number]

## 2016-11-24 NOTE — Progress Notes (Signed)
Per chart review, patient not "visualized in lobby, outside, or bathroom in lobby when called to room". CSW went to see if patient was still at hospital to speak with him, CSW could not find patient. CSW signing off.

## 2016-11-24 NOTE — ED Notes (Signed)
Pt eating food out of his backpack in triage

## 2016-11-24 NOTE — ED Provider Notes (Signed)
[facility] DEPT Provider Note   CSN: [id number] Arrival date & time: 11/24/16  1812     History   Chief Complaint Chief Complaint  Patient presents with  . Hypertension    HPI Mike [name] is a 56 y.o. male. CC:  High blood sugar  HPI 56 year old male. Multiple visits to this emergency room on a regular basis including multiple over the last 2 days. Presented with hyperglycemia but not in ketoacidosis. Has had his insulin and fluids and had glycemic control. Multiple times recently has had interactions with social work. Has been given prescriptions and has had appointment set at [facility]. Does not go, does not take medications.  Who presents here after being discharged less than 2 hours ago and calling from nearby.  Past Medical History:  Diagnosis Date  . Anxiety   . Anxiety   . Chronic lower back pain   . Depression   . DKA (diabetic ketoacidoses) ([place]) 07/30/2016  . Hyperlipemia   . Hypertension   . Migraine    "last one was ~ 4 yr ago" (12/23/2014)  . Seizures ([place])    "related to pills for anxiety; if I don't take the pills I'm suppose to take I'll have them" (12/23/2014)  . Type II diabetes mellitus [facility])     Patient Active Problem List   Diagnosis Date Noted  . Lactic acidosis 11/18/2016  . Type 2 diabetes mellitus with hyperosmolarity without nonketotic hyperglycemic-hyperosmolar coma [facility]) ([place]) 11/06/2016  . Normocytic anemia 11/06/2016  . HLD (hyperlipidemia) 11/06/2016  . Adjustment disorder with other symptoms 11/05/2016  . CKD (chronic kidney disease) 10/22/2016  . Uncontrolled type 2 diabetes mellitus with hyperosmolar nonketotic hyperglycemia ([place]) 08/24/2016  . Essential hypertension 08/24/2016  . Acute ischemic stroke ([place])   . Acute renal failure superimposed on stage 2 chronic kidney disease ([place]) 07/31/2016  . Acute metabolic encephalopathy 78/29/5621  . DKA (diabetic ketoacidoses) ([place])  06/24/2016  . Acute kidney injury (nontraumatic) ([place]) 06/24/2016  . Protein-calorie malnutrition, severe 12/24/2014  . Chest pain, pleuritic 12/23/2014  . Homelessness 12/23/2014  . Hypertensive urgency 12/23/2014  . DM (diabetes mellitus) ([place]) 12/23/2014  . Intermittent palpitations 12/23/2014  . Tobacco abuse 12/23/2014  . Pleuritic chest pain 12/23/2014  . Abnormal EKG 12/23/2014  . Cough   . Type II diabetes mellitus with renal manifestations [facility])     Past Surgical History:  Procedure Laterality Date  . NO PAST SURGERIES         Home Medications    Prior to Admission medications   Medication Sig Start Date End Date Taking? Authorizing Provider  aspirin 81 MG tablet Take 1 tablet (81 mg total) by mouth daily. 11/04/16 12/04/16 Yes Doreatha Lew, MD  atorvastatin (LIPITOR) 40 MG tablet Take 1 tablet (40 mg total) daily by mouth. 11/23/16  Yes Ward, Kristen N, DO  ferrous sulfate 325 (65 FE) MG tablet Take 1 tablet (325 mg total) daily by mouth. 11/23/16  Yes Ward, Delice Bison, DO  blood glucose meter kit and supplies KIT Dispense based on patient and insurance preference. Use up to four times daily as directed. (FOR ICD-9 250.00, 250.01). 11/22/16   Velvet Bathe, MD  glyBURIDE (DIABETA) 5 MG tablet Take 1 tablet (5 mg total) 2 (two) times daily with a meal by mouth. 11/24/16   Tanna Furry, MD  insulin aspart protamine- aspart (NOVOLOG MIX 70/30) (70-30) 100 UNIT/ML injection Inject 0.25 mLs (25 Units total) 2 (two) times daily  with a meal into the skin. 11/24/16   Tanna Furry, MD    Family History Family History  Problem Relation Age of Onset  . Diabetes Mellitus II Mother   . Diabetes Mellitus II Father     Social History Social History   Tobacco Use  . Smoking status: Current Every Day Smoker    Packs/day: 0.10    Years: 40.00    Pack years: 4.00    Types: Cigarettes  . Smokeless tobacco: Never Used  Substance Use Topics  . Alcohol use: Yes     Alcohol/week: 1.2 oz    Types: 2 Cans of beer per week  . Drug use: No    Comment: 12/23/2014 "stopped ~ 10 yrs ago"     Allergies   Ibuprofen and Tylenol [acetaminophen]   Review of Systems Review of Systems  Constitutional: Negative for appetite change, chills, diaphoresis, fatigue and fever.  HENT: Negative for mouth sores, sore throat and trouble swallowing.   Eyes: Negative for visual disturbance.  Respiratory: Negative for cough, chest tightness, shortness of breath and wheezing.   Cardiovascular: Negative for chest pain.  Gastrointestinal: Negative for abdominal distention, abdominal pain, diarrhea, nausea and vomiting.  Endocrine: Positive for polydipsia, polyphagia and polyuria.  Genitourinary: Negative for dysuria, frequency and hematuria.  Musculoskeletal: Negative for gait problem.  Skin: Negative for color change, pallor and rash.  Neurological: Negative for dizziness, syncope, light-headedness and headaches.  Hematological: Does not bruise/bleed easily.  Psychiatric/Behavioral: Negative for behavioral problems and confusion.     Physical Exam Updated Vital Signs BP (!) 171/91 (BP Location: Right Arm)   Pulse 70   Temp 98.5 F (36.9 C) (Oral)   Resp 18   SpO2 100%   Physical Exam  Constitutional: He is oriented to person, place, and time. He appears well-developed and well-nourished. No distress.  HENT:  Head: Normocephalic.  Eyes: Conjunctivae are normal. Pupils are equal, round, and reactive to light. No scleral icterus.  Neck: Normal range of motion. Neck supple. No thyromegaly present.  Cardiovascular: Normal rate and regular rhythm. Exam reveals no gallop and no friction rub.  No murmur heard. Symmetric lower extremity edema.ric LE edama  Pulmonary/Chest: Effort normal and breath sounds normal. No respiratory distress. He has no wheezes. He has no rales.  Abdominal: Soft. Bowel sounds are normal. He exhibits no distension. There is no tenderness. There  is no rebound.  Musculoskeletal: Normal range of motion.  Neurological: He is alert and oriented to person, place, and time.  Skin: Skin is warm and dry. No rash noted.  Psychiatric: He has a normal mood and affect. His behavior is normal.     ED Treatments / Results  Labs (all labs ordered are listed, but only abnormal results are displayed) Labs Reviewed  I-STAT CHEM 8, ED - Abnormal; Notable for the following components:      Result Value   BUN 28 (*)    Glucose, Bld 609 (*)    Hemoglobin 10.5 (*)    HCT 31.0 (*)    All other components within normal limits    EKG  EKG Interpretation None       Radiology No results found.  Procedures Procedures (including critical care time)  Medications Ordered in ED Medications - No data to display   Initial Impression / Assessment and Plan / ED Course  I have reviewed the triage vital signs and the nursing notes.  Pertinent labs & imaging results that were available during my care of  the patient were reviewed by me and considered in my medical decision making (see chart for details).    Hyperglycemia without acidosis. I had a long discussion with him about actually taking his prescriptions to [facility] wellness center. He literally just has to present himself there and they will be given to him this is been arranged multiple times. No indication for admission further studies at this time.  Final Clinical Impressions(s) / ED Diagnoses   Final diagnoses:  Noncompliance  Anxiety    ED Discharge Orders        Ordered    glyBURIDE (DIABETA) 5 MG tablet  2 times daily with meals     11/24/16 1855    insulin aspart protamine- aspart (NOVOLOG MIX 70/30) (70-30) 100 UNIT/ML injection  2 times daily with meals     11/24/16 1855       Tanna Furry, MD 11/24/16 1901

## 2016-11-24 NOTE — ED Notes (Signed)
Pt not in lobby. Mike LauthPlunkett, MD made aware.

## 2016-11-24 NOTE — ED Notes (Addendum)
Social work called/message left per Anitra LauthPlunkett, MD request. Plan to arrange transport/appointment for pt to see his PCP.

## 2016-11-24 NOTE — ED Triage Notes (Signed)
Per EMS, patient complaint of high blood pressure and "not feeling good." [facility].

## 2016-11-24 NOTE — ED Notes (Signed)
Bed: WTR7 Expected date:  Expected time:  Means of arrival:  Comments: 

## 2016-11-24 NOTE — Discharge Instructions (Signed)
No emergency condition exists tonight.  Your blood sugar is high because you do not treat your diabetes.  You are AGAIN being given prescriptions for your medications.  These will be given to you FREE OF CHARGE.  All that you have to do is [facility] ANY [facility]EEKDAY.  Really!!

## 2016-11-24 NOTE — ED Notes (Signed)
Pt not visualized in lobby, outside, or bathroom in lobby when called to room.

## 2016-11-24 NOTE — ED Triage Notes (Signed)
Patient brought in by EMS. Patient states his blood sugar high. This is patient 5th time to the ED today for the same thing. Patient states he was not able to get to his medication. Patient is given medication free [name] and help him get his medication .

## 2016-11-24 NOTE — ED Notes (Signed)
Bed: WTR8 Expected date:  Expected time:  Means of arrival:  Comments: 

## 2016-11-24 NOTE — Discharge Instructions (Signed)
Call your doctor to discuss your recent visit.   TAKE YOUR MEDICATIONS AS PRESCRIBED.

## 2016-11-24 NOTE — ED Triage Notes (Signed)
Per EMS pt complaint "of not feeling well." CBG with EMS 214; pt just discharged from ED.

## 2016-11-24 NOTE — ED Provider Notes (Signed)
[facility] DEPT Provider Note   CSN: [id number] Arrival date & time: 11/24/16  6720     History   Chief Complaint Chief Complaint  Patient presents with  . Hyperglycemia    HPI Mike [name] is a 56 y.o. male.   The history is provided by the patient.  He has a history of diabetes, hypertension, anxiety. He comes in because his blood sugar is high.  He had 4 ED visits yesterday related to blood pressure and blood sugar.  He states that the problem is the same as before.  Past Medical History:  Diagnosis Date  . Anxiety   . Anxiety   . Chronic lower back pain   . Depression   . DKA (diabetic ketoacidoses) ([place]) 07/30/2016  . Hyperlipemia   . Hypertension   . Migraine    "last one was ~ 4 yr ago" (12/23/2014)  . Seizures ([place])    "related to pills for anxiety; if I don't take the pills I'm suppose to take I'll have them" (12/23/2014)  . Type II diabetes mellitus [facility])     Patient Active Problem List   Diagnosis Date Noted  . Lactic acidosis 11/18/2016  . Type 2 diabetes mellitus with hyperosmolarity without nonketotic hyperglycemic-hyperosmolar coma [facility]) ([place]) 11/06/2016  . Normocytic anemia 11/06/2016  . HLD (hyperlipidemia) 11/06/2016  . Adjustment disorder with other symptoms 11/05/2016  . CKD (chronic kidney disease) 10/22/2016  . Uncontrolled type 2 diabetes mellitus with hyperosmolar nonketotic hyperglycemia ([place]) 08/24/2016  . Essential hypertension 08/24/2016  . Acute ischemic stroke ([place])   . Acute renal failure superimposed on stage 2 chronic kidney disease ([place]) 07/31/2016  . Acute metabolic encephalopathy 94/70/9628  . DKA (diabetic ketoacidoses) ([place]) 06/24/2016  . Acute kidney injury (nontraumatic) ([place]) 06/24/2016  . Protein-calorie malnutrition, severe 12/24/2014  . Chest pain, pleuritic 12/23/2014  . Homelessness 12/23/2014  . Hypertensive urgency 12/23/2014  . DM (diabetes mellitus) ([place]) 12/23/2014  .  Intermittent palpitations 12/23/2014  . Tobacco abuse 12/23/2014  . Pleuritic chest pain 12/23/2014  . Abnormal EKG 12/23/2014  . Cough   . Type II diabetes mellitus with renal manifestations [facility])     Past Surgical History:  Procedure Laterality Date  . NO PAST SURGERIES         Home Medications    Prior to Admission medications   Medication Sig Start Date End Date Taking? Authorizing Provider  aspirin 81 MG tablet Take 1 tablet (81 mg total) by mouth daily. 11/04/16 12/04/16 Yes Doreatha Lew, MD  atorvastatin (LIPITOR) 40 MG tablet Take 1 tablet (40 mg total) daily by mouth. 11/23/16  Yes Ward, Kristen N, DO  ferrous sulfate 325 (65 FE) MG tablet Take 1 tablet (325 mg total) daily by mouth. 11/23/16  Yes Ward, Kristen N, DO  glyBURIDE (DIABETA) 5 MG tablet Take 1 tablet (5 mg total) 2 (two) times daily with a meal by mouth. 11/23/16  Yes Ward, Cyril Mourning N, DO  insulin aspart protamine- aspart (NOVOLOG MIX 70/30) (70-30) 100 UNIT/ML injection Inject 0.25 mLs (25 Units total) 2 (two) times daily with a meal into the skin. 11/23/16  Yes Ward, Delice Bison, DO  blood glucose meter kit and supplies KIT Dispense based on patient and insurance preference. Use up to four times daily as directed. (FOR ICD-9 250.00, 250.01). 11/22/16   Velvet Bathe, MD    Family History Family History  Problem Relation Age of Onset  . Diabetes Mellitus II Mother   . Diabetes Mellitus  II Father     Social History Social History   Tobacco Use  . Smoking status: Current Every Day Smoker    Packs/day: 0.10    Years: 40.00    Pack years: 4.00    Types: Cigarettes  . Smokeless tobacco: Never Used  Substance Use Topics  . Alcohol use: Yes    Alcohol/week: 1.2 oz    Types: 2 Cans of beer per week  . Drug use: No    Comment: 12/23/2014 "stopped ~ 10 yrs ago"     Allergies   Ibuprofen and Tylenol [acetaminophen]   Review of Systems Review of Systems  All other systems reviewed and are  negative.    Physical Exam Updated Vital Signs BP (!) 182/87 (BP Location: Right Arm)   Pulse 76   Temp 98.8 F (37.1 C) (Oral)   Resp 18   Ht _0  (1.93 m)   Wt 77.1 kg (170 lb)   SpO2 98%   BMI 20.69 kg/m    Physical Exam  Nursing note and vitals reviewed.  56 year old male, resting comfortably and in no acute distress. Vital signs are significant for hypertension. Oxygen saturation is 98%, which is normal. Head is normocephalic and atraumatic. PERRLA, EOMI. Oropharynx is clear. Neck is nontender and supple without adenopathy or JVD. Back is nontender and there is no CVA tenderness. Lungs are clear without rales, wheezes, or rhonchi. Chest is nontender. Heart has regular rate and rhythm without murmur. Abdomen is soft, flat, nontender without masses or hepatosplenomegaly and peristalsis is normoactive. Extremities have no cyanosis or edema, full range of motion is present. Skin is warm and dry without rash. Neurologic: Mental status is normal, cranial nerves are intact, there are no motor or sensory deficits.  ED Treatments / Results  Labs (all labs ordered are listed, but only abnormal results are displayed) Labs Reviewed  CBG MONITORING, ED    Procedures Procedures (including critical care time)  Medications Ordered in ED Medications - No data to display   Initial Impression / Assessment and Plan / ED Course  I have reviewed the triage vital signs and the nursing notes.  Pertinent lab results that were available during my care of the patient were reviewed by me and considered in my medical decision making (see chart for details).  Diabetes and hypertension with medication noncompliance.  Review of old records shows 4 ED visits in the last 24 hours separate from this 1-all for elevated blood pressure and blood glucose.  Hospitalization from November 2 - November 7 with discharge with appropriate resources.  He has never had any of his medications filled.   Glucose is once again elevated to 506.  He is given subcutaneous insulin and will be kept in the ED until blood glucose is at an acceptable level.  Final Clinical Impressions(s) / ED Diagnoses   Final diagnoses:  Hyperglycemia  Essential hypertension  Noncompliance with medication regimen    ED Discharge Orders    None      Delora Fuel, MD 03/00/92 1650

## 2016-11-24 NOTE — ED Notes (Signed)
Bed: WA02 Expected date:  Expected time:  Means of arrival:  Comments: 56 yo hyperglycemia, hypertension

## 2016-11-24 NOTE — ED Notes (Signed)
Pt not in lobby.  

## 2016-11-26 ENCOUNTER — Inpatient Hospital Stay (HOSPITAL_COMMUNITY)
Admission: EM | Admit: 2016-11-26 | Discharge: 2016-12-22 | DRG: 637 | Disposition: A | Discharge status: Skilled Nursing Facility | Payer: Self-pay | Attending: Internal Medicine | Admitting: Internal Medicine

## 2016-11-26 ENCOUNTER — Encounter (HOSPITAL_COMMUNITY): Payer: Self-pay

## 2016-11-26 ENCOUNTER — Other Ambulatory Visit: Payer: Self-pay

## 2016-11-26 DIAGNOSIS — I10 Essential (primary) hypertension: Secondary | ICD-10-CM | POA: Diagnosis present

## 2016-11-26 DIAGNOSIS — F17210 Nicotine dependence, cigarettes, uncomplicated: Secondary | ICD-10-CM | POA: Diagnosis present

## 2016-11-26 DIAGNOSIS — Z681 Body mass index (BMI) 19 or less, adult: Secondary | ICD-10-CM

## 2016-11-26 DIAGNOSIS — D649 Anemia, unspecified: Secondary | ICD-10-CM | POA: Diagnosis present

## 2016-11-26 DIAGNOSIS — Z5900 Homelessness unspecified: Secondary | ICD-10-CM

## 2016-11-26 DIAGNOSIS — R4189 Other symptoms and signs involving cognitive functions and awareness: Secondary | ICD-10-CM | POA: Diagnosis present

## 2016-11-26 DIAGNOSIS — Z008 Encounter for other general examination: Secondary | ICD-10-CM

## 2016-11-26 DIAGNOSIS — I2510 Atherosclerotic heart disease of native coronary artery without angina pectoris: Secondary | ICD-10-CM | POA: Diagnosis present

## 2016-11-26 DIAGNOSIS — Z91120 Patient's intentional underdosing of medication regimen due to financial hardship: Secondary | ICD-10-CM

## 2016-11-26 DIAGNOSIS — E1100 Type 2 diabetes mellitus with hyperosmolarity without nonketotic hyperglycemic-hyperosmolar coma (NKHHC): Principal | ICD-10-CM | POA: Diagnosis present

## 2016-11-26 DIAGNOSIS — T383X6A Underdosing of insulin and oral hypoglycemic [antidiabetic] drugs, initial encounter: Secondary | ICD-10-CM | POA: Diagnosis present

## 2016-11-26 DIAGNOSIS — Z833 Family history of diabetes mellitus: Secondary | ICD-10-CM

## 2016-11-26 DIAGNOSIS — Z9111 Patient's noncompliance with dietary regimen: Secondary | ICD-10-CM

## 2016-11-26 DIAGNOSIS — E43 Unspecified severe protein-calorie malnutrition: Secondary | ICD-10-CM | POA: Diagnosis present

## 2016-11-26 DIAGNOSIS — Z91128 Patient's intentional underdosing of medication regimen for other reason: Secondary | ICD-10-CM

## 2016-11-26 DIAGNOSIS — I160 Hypertensive urgency: Secondary | ICD-10-CM | POA: Diagnosis present

## 2016-11-26 DIAGNOSIS — E1065 Type 1 diabetes mellitus with hyperglycemia: Secondary | ICD-10-CM

## 2016-11-26 DIAGNOSIS — E785 Hyperlipidemia, unspecified: Secondary | ICD-10-CM | POA: Diagnosis present

## 2016-11-26 DIAGNOSIS — E11649 Type 2 diabetes mellitus with hypoglycemia without coma: Secondary | ICD-10-CM | POA: Diagnosis not present

## 2016-11-26 LAB — GLUCOSE, CAPILLARY
GLUCOSE-CAPILLARY: 289 mg/dL — AB (ref 65–99)
GLUCOSE-CAPILLARY: 243 mg/dL — AB (ref 65–99)
GLUCOSE-CAPILLARY: 128 mg/dL — AB (ref 65–99)
GLUCOSE-CAPILLARY: 129 mg/dL — AB (ref 65–99)
GLUCOSE-CAPILLARY: 339 mg/dL — AB (ref 65–99)
GLUCOSE-CAPILLARY: 350 mg/dL — AB (ref 65–99)
Glucose-Capillary: 479 mg/dL — ABNORMAL HIGH (ref 65–99)
Glucose-Capillary: 411 mg/dL — ABNORMAL HIGH (ref 65–99)
Glucose-Capillary: 172 mg/dL — ABNORMAL HIGH (ref 65–99)
Glucose-Capillary: 94 mg/dL (ref 65–99)
Glucose-Capillary: 153 mg/dL — ABNORMAL HIGH (ref 65–99)
Glucose-Capillary: 229 mg/dL — ABNORMAL HIGH (ref 65–99)

## 2016-11-26 LAB — ETHANOL

## 2016-11-26 LAB — CBC
HEMATOCRIT: 30.8 % — AB (ref 39.0–52.0)
Hemoglobin: 9.5 g/dL — ABNORMAL LOW (ref 13.0–17.0)
MCH: 25.3 pg — AB (ref 26.0–34.0)
MCHC: 30.8 g/dL (ref 30.0–36.0)
MCV: 82.1 fL (ref 78.0–100.0)
PLATELETS: 332 10*3/uL (ref 150–400)
RBC: 3.75 MIL/uL — AB (ref 4.22–5.81)
RDW: 15.5 % (ref 11.5–15.5)
WBC: 7.6 10*3/uL (ref 4.0–10.5)

## 2016-11-26 LAB — BASIC METABOLIC PANEL
Anion gap: 11 (ref 5–15)
Anion gap: 9 (ref 5–15)
Anion gap: 8 (ref 5–15)
Anion gap: 7 (ref 5–15)
Anion gap: 7 (ref 5–15)
Anion gap: 7 (ref 5–15)
BUN: 24 mg/dL — AB (ref 6–20)
BUN: 20 mg/dL (ref 6–20)
BUN: 17 mg/dL (ref 6–20)
BUN: 15 mg/dL (ref 6–20)
BUN: 14 mg/dL (ref 6–20)
BUN: 13 mg/dL (ref 6–20)
CALCIUM: 10 mg/dL (ref 8.9–10.3)
CALCIUM: 9.9 mg/dL (ref 8.9–10.3)
CALCIUM: 9.8 mg/dL (ref 8.9–10.3)
CALCIUM: 9.2 mg/dL (ref 8.9–10.3)
CALCIUM: 9.1 mg/dL (ref 8.9–10.3)
CHLORIDE: 103 mmol/L (ref 101–111)
CO2: 27 mmol/L (ref 22–32)
CO2: 29 mmol/L (ref 22–32)
CO2: 28 mmol/L (ref 22–32)
CO2: 30 mmol/L (ref 22–32)
CO2: 30 mmol/L (ref 22–32)
CO2: 29 mmol/L (ref 22–32)
CREATININE: 0.89 mg/dL (ref 0.61–1.24)
CREATININE: 0.88 mg/dL (ref 0.61–1.24)
CREATININE: 0.82 mg/dL (ref 0.61–1.24)
CREATININE: 0.72 mg/dL (ref 0.61–1.24)
Calcium: 10.2 mg/dL (ref 8.9–10.3)
Chloride: 106 mmol/L (ref 101–111)
Chloride: 110 mmol/L (ref 101–111)
Chloride: 114 mmol/L — ABNORMAL HIGH (ref 101–111)
Chloride: 110 mmol/L (ref 101–111)
Chloride: 106 mmol/L (ref 101–111)
Creatinine, Ser: 0.9 mg/dL (ref 0.61–1.24)
Creatinine, Ser: 0.77 mg/dL (ref 0.61–1.24)
GFR calc non Af Amer: >60 mL/min (ref >60)
GFR calc non Af Amer: >60 mL/min (ref >60)
GFR calc non Af Amer: >60 mL/min (ref >60)
GFR calc non Af Amer: >60 mL/min (ref >60)
GLUCOSE: 134 mg/dL — AB (ref 65–99)
GLUCOSE: 238 mg/dL — AB (ref 65–99)
Glucose, Bld: 727 mg/dL (ref 65–99)
Glucose, Bld: 604 mg/dL (ref 65–99)
Glucose, Bld: 420 mg/dL — ABNORMAL HIGH (ref 65–99)
Glucose, Bld: 139 mg/dL — ABNORMAL HIGH (ref 65–99)
POTASSIUM: 4.7 mmol/L (ref 3.5–5.1)
Potassium: 4.5 mmol/L (ref 3.5–5.1)
Potassium: 4.1 mmol/L (ref 3.5–5.1)
Potassium: 3.2 mmol/L — ABNORMAL LOW (ref 3.5–5.1)
Potassium: 3.5 mmol/L (ref 3.5–5.1)
Potassium: 3.9 mmol/L (ref 3.5–5.1)
SODIUM: 141 mmol/L (ref 135–145)
SODIUM: 144 mmol/L (ref 135–145)
SODIUM: 146 mmol/L — AB (ref 135–145)
Sodium: 151 mmol/L — ABNORMAL HIGH (ref 135–145)
Sodium: 147 mmol/L — ABNORMAL HIGH (ref 135–145)
Sodium: 142 mmol/L (ref 135–145)

## 2016-11-26 LAB — URINALYSIS, ROUTINE W REFLEX MICROSCOPIC
BACTERIA UA: NONE SEEN
BILIRUBIN URINE: NEGATIVE
Hgb urine dipstick: NEGATIVE
KETONES UR: NEGATIVE mg/dL
LEUKOCYTES UA: NEGATIVE
NITRITE: NEGATIVE
PH: 7 (ref 5.0–8.0)
Protein, ur: NEGATIVE mg/dL
RBC / HPF: NONE SEEN RBC/hpf (ref 0–5)
SPECIFIC GRAVITY, URINE: 1.023 (ref 1.005–1.030)
Squamous Epithelial / LPF: NONE SEEN

## 2016-11-26 LAB — MRSA PCR SCREENING: MRSA BY PCR: NEGATIVE

## 2016-11-26 LAB — RAPID URINE DRUG SCREEN, HOSP PERFORMED
Amphetamines: NOT DETECTED
BARBITURATES: NOT DETECTED
BENZODIAZEPINES: NOT DETECTED
Cocaine: NOT DETECTED
OPIATES: NOT DETECTED
Tetrahydrocannabinol: NOT DETECTED

## 2016-11-26 LAB — CBG MONITORING, ED: Glucose-Capillary: >600 mg/dL (ref 65–99)

## 2016-11-26 MED ORDER — INSULIN GLARGINE 100 UNIT/ML ~~LOC~~ SOLN
8.0000 [IU] | Freq: Every day | SUBCUTANEOUS | Status: DC
  Administered 2016-11-26: 8 [IU] via SUBCUTANEOUS
  Filled 2016-11-26 (×2): qty 0.08

## 2016-11-26 MED ORDER — POTASSIUM CHLORIDE 10 MEQ/100ML IV SOLN: 10.0000 meq | INTRAVENOUS | Status: DC

## 2016-11-26 MED ORDER — POTASSIUM CHLORIDE 10 MEQ/100ML IV SOLN
10.0000 meq | INTRAVENOUS | Status: AC
  Administered 2016-11-26 (×3): 10 meq via INTRAVENOUS
  Filled 2016-11-26: qty 100

## 2016-11-26 MED ORDER — SODIUM CHLORIDE 0.9 % IV BOLUS (SEPSIS)
1000.0000 mL | Freq: Once | INTRAVENOUS | Status: AC
  Administered 2016-11-26: 1000 mL via INTRAVENOUS

## 2016-11-26 MED ORDER — INSULIN ASPART 100 UNIT/ML ~~LOC~~ SOLN
0.0000 [IU] | Freq: Three times a day (TID) | SUBCUTANEOUS | Status: DC
  Administered 2016-11-26: 3 [IU] via SUBCUTANEOUS
  Administered 2016-11-27: 5 [IU] via SUBCUTANEOUS
  Administered 2016-11-27: 7 [IU] via SUBCUTANEOUS
  Administered 2016-11-27: 9 [IU] via SUBCUTANEOUS
  Administered 2016-11-28: 7 [IU] via SUBCUTANEOUS
  Administered 2016-11-28: 9 [IU] via SUBCUTANEOUS

## 2016-11-26 MED ORDER — INSULIN ASPART 100 UNIT/ML ~~LOC~~ SOLN
0.0000 [IU] | Freq: Every day | SUBCUTANEOUS | Status: DC
  Administered 2016-11-26: 4 [IU] via SUBCUTANEOUS
  Administered 2016-11-27: 3 [IU] via SUBCUTANEOUS

## 2016-11-26 MED ORDER — ENOXAPARIN SODIUM 40 MG/0.4ML ~~LOC~~ SOLN
40.0000 mg | Freq: Every day | SUBCUTANEOUS | Status: DC
  Administered 2016-11-26 – 2016-12-22 (×27): 40 mg via SUBCUTANEOUS
  Filled 2016-11-26 (×27): qty 0.4

## 2016-11-26 MED ORDER — ATORVASTATIN CALCIUM 40 MG PO TABS
40.0000 mg | ORAL_TABLET | Freq: Every day | ORAL | Status: DC
  Administered 2016-11-26 – 2016-12-21 (×26): 40 mg via ORAL
  Filled 2016-11-26 (×27): qty 1

## 2016-11-26 MED ORDER — SODIUM CHLORIDE 0.9 % IV SOLN: INTRAVENOUS | Status: DC

## 2016-11-26 MED ORDER — SODIUM CHLORIDE 0.9 % IV SOLN
INTRAVENOUS | Status: DC
  Administered 2016-11-26: 4.2 [IU]/h via INTRAVENOUS
  Filled 2016-11-26 (×2): qty 1

## 2016-11-26 MED ORDER — POTASSIUM CHLORIDE 10 MEQ/100ML IV SOLN
10.0000 meq | Freq: Once | INTRAVENOUS | Status: AC
  Administered 2016-11-26: 10 meq via INTRAVENOUS
  Filled 2016-11-26: qty 100

## 2016-11-26 MED ORDER — HYDRALAZINE HCL 20 MG/ML IJ SOLN
10.0000 mg | INTRAMUSCULAR | Status: DC | PRN
  Administered 2016-11-26 – 2016-12-02 (×5): 10 mg via INTRAVENOUS
  Filled 2016-11-26 (×5): qty 1

## 2016-11-26 MED ORDER — DEXTROSE-NACL 5-0.45 % IV SOLN
INTRAVENOUS | Status: DC
  Administered 2016-11-26 (×2): via INTRAVENOUS

## 2016-11-26 MED ORDER — LABETALOL HCL 5 MG/ML IV SOLN
10.0000 mg | INTRAVENOUS | Status: DC | PRN
  Administered 2016-11-26: 10 mg via INTRAVENOUS
  Filled 2016-11-26 (×2): qty 4

## 2016-11-26 MED ORDER — ASPIRIN EC 81 MG PO TBEC
81.0000 mg | DELAYED_RELEASE_TABLET | Freq: Every day | ORAL | Status: DC
  Administered 2016-11-26 – 2016-12-22 (×27): 81 mg via ORAL
  Filled 2016-11-26 (×27): qty 1

## 2016-11-26 MED ORDER — SODIUM CHLORIDE 0.9 % IV SOLN
INTRAVENOUS | Status: DC
  Administered 2016-11-26: 04:00:00 via INTRAVENOUS

## 2016-11-26 MED ORDER — FERROUS SULFATE 325 (65 FE) MG PO TABS
325.0000 mg | ORAL_TABLET | Freq: Every day | ORAL | Status: DC
  Administered 2016-11-26 – 2016-12-22 (×27): 325 mg via ORAL
  Filled 2016-11-26 (×27): qty 1

## 2016-11-26 NOTE — H&P (Signed)
History and Physical    Mike [name] KNL:[id number] DOB: 06/15/1960 DOA: 11/26/2016  PCP: [name], NP   Patient coming from: Home  Chief Complaint: malaise, CBG "high"   HPI: Mike [name] is a 56 y.o. male with medical history significant for insulin-dependent diabetes mellitus, homelessness, hypertension, and chronic anemia, now presenting to the emergency department with malaise.  EMS was called out to the patient's shelter for evaluation of malaise, found CBG to read "high" and blood pressure of 200s/100s.  He was soiled in urine and feces on arrival to ED.  Patient denies fevers, chills, or chest pain.  Not very cooperative with the interview.     ED Course: Upon arrival to the ED, patient is found to be afebrile, saturating adequately on room air, hypertensive to 196/93, and with vitals otherwise stable.  Chemistry panel reveals a serum glucose of 727 and an elevated BUN to creatinine ratio.  CBC features a stable chronic normocytic anemia with hemoglobin of 9.5.  Urinalysis is notable for glucosuria.  UDS is negative and ethanol.  Patient was given a liter of normal saline in the ED.  He remained hemodynamically stable and in no apparent respiratory distress, and will be admitted to the stepdown unit for ongoing evaluation and management of HHS.  Review of Systems:  Unable to complete ROS secondary to patient's poor cooperation with interview.  Past Medical History:  Diagnosis Date  . Anxiety   . Anxiety   . Chronic lower back pain   . Depression   . DKA (diabetic ketoacidoses) ([employer]) 07/30/2016  . Hyperlipemia   . Hypertension   . Migraine    "last one was ~ 4 yr ago" (12/23/2014)  . Seizures ([place])    "related to pills for anxiety; if I don't take the pills I'm suppose to take I'll have them" (12/23/2014)  . Type II diabetes mellitus ([place])     Past Surgical History:  Procedure Laterality Date  . NO PAST SURGERIES       reports that he has been smoking  cigarettes.  He has a 4.00 pack-year smoking history. he has never used smokeless tobacco. He reports that he drinks about 1.2 oz of alcohol per week. He reports that he does not use drugs.  Allergies  Allergen Reactions  . Ibuprofen Nausea And Vomiting and Other (See Comments)    Reaction:  Bloating   . Tylenol [Acetaminophen] Nausea And Vomiting and Other (See Comments)    Reaction:  Bloating     Family History  Problem Relation Age of Onset  . Diabetes Mellitus II Mother   . Diabetes Mellitus II Father      Prior to Admission medications   Medication Sig Start Date End Date Taking? Authorizing Provider  aspirin 81 MG tablet Take 1 tablet (81 mg total) by mouth daily. 11/04/16 12/04/16 Yes Doreatha Lew, MD  atorvastatin (LIPITOR) 40 MG tablet Take 1 tablet (40 mg total) daily by mouth. 11/23/16  Yes Ward, Delice Bison, DO  blood glucose meter kit and supplies KIT Dispense based on patient and insurance preference. Use up to four times daily as directed. (FOR ICD-9 250.00, 250.01). 11/22/16  Yes Velvet Bathe, MD  ferrous sulfate 325 (65 FE) MG tablet Take 1 tablet (325 mg total) daily by mouth. 11/23/16  Yes Ward, Kristen N, DO  glyBURIDE (DIABETA) 5 MG tablet Take 1 tablet (5 mg total) 2 (two) times daily with a meal by mouth. 11/24/16  Yes Tanna Furry, MD  insulin aspart protamine- aspart (NOVOLOG MIX 70/30) (70-30) 100 UNIT/ML injection Inject 0.25 mLs (25 Units total) 2 (two) times daily with a meal into the skin. 11/24/16  Yes Tanna Furry, MD    Physical Exam: Vitals:   11/26/16 0100 11/26/16 0145 11/26/16 0200 11/26/16 0230  BP: (!) 190/88 (!) 188/87 (!) 182/82 (!) 185/84  Pulse: 65 71 72 67  Resp:  '13 12 12  '$ Temp:      TempSrc:      SpO2: 95% 95% 96% 97%  Weight:      Height:          Constitutional: NAD, calm, chronically-ill appearing Eyes: PERTLA, lids and conjunctivae normal ENMT: Mucous membranes are moist. Posterior pharynx clear of any exudate or lesions.     Neck: normal, supple, no masses, no thyromegaly Respiratory: clear to auscultation bilaterally, no wheezing, no crackles. Normal respiratory effort.   Cardiovascular: S1 & S2 heard, regular rate and rhythm. Marked pretibial pitting edema bilaterally. No significant JVD. Abdomen: No distension, no tenderness, no masses palpated. Bowel sounds active.  Musculoskeletal: no clubbing / cyanosis. No joint deformity upper and lower extremities.    Skin: no significant rashes, lesions, ulcers. Warm, dry, well-perfused. Neurologic: No facial asymmetry. PERRL, EOMI.  Moving all extremities.  Psychiatric: Alert, but not willing to speak much at all; difficult to assess.      Labs on Admission: I have personally reviewed following labs and imaging studies  CBC: Recent Labs  Lab 11/23/16 0501 11/23/16 1830 11/24/16 0844 11/24/16 1847 11/26/16 0107  WBC 5.0 5.3  --   --  7.6  NEUTROABS 3.0 3.0  --   --   --   HGB 7.7* 8.6* 11.2* 10.5* 9.5*  HCT 24.5* 27.4* 33.0* 31.0* 30.8*  MCV 81.1 82.0  --   --  82.1  PLT 251 308  --   --  702   Basic Metabolic Panel: Recent Labs  Lab 11/23/16 0501 11/23/16 1830 11/24/16 0844 11/24/16 1847 11/26/16 0107  NA 133* 136 138 138 141  K 3.7 5.1 3.9 4.7 4.7  CL 101 102 101 103 103  CO2 23 26  --   --  27  GLUCOSE 404* 584* 386* 609* 727*  BUN 15 24* 18 28* 24*  CREATININE 0.93 1.00 0.60* 0.80 0.90  CALCIUM 8.6* 9.4  --   --  10.2   GFR: Estimated Creatinine Clearance: 101.1 mL/min (by C-G formula based on SCr of 0.9 mg/dL). Liver Function Tests: No results for input(s): AST, ALT, ALKPHOS, BILITOT, PROT, ALBUMIN in the last 168 hours. No results for input(s): LIPASE, AMYLASE in the last 168 hours. No results for input(s): AMMONIA in the last 168 hours. Coagulation Profile: No results for input(s): INR, PROTIME in the last 168 hours. Cardiac Enzymes: No results for input(s): CKTOTAL, CKMB, CKMBINDEX, TROPONINI in the last 168 hours. BNP (last 3  results) No results for input(s): PROBNP in the last 8760 hours. HbA1C: No results for input(s): HGBA1C in the last 72 hours. CBG: Recent Labs  Lab 11/23/16 1738 11/23/16 2017 11/24/16 0516 11/24/16 1110 11/26/16 0118  GLUCAP >600* 344* 504* 195* >600*   Lipid Profile: No results for input(s): CHOL, HDL, LDLCALC, TRIG, CHOLHDL, LDLDIRECT in the last 72 hours. Thyroid Function Tests: No results for input(s): TSH, T4TOTAL, FREET4, T3FREE, THYROIDAB in the last 72 hours. Anemia Panel: No results for input(s): VITAMINB12, FOLATE, FERRITIN, TIBC, IRON, RETICCTPCT in the last 72 hours. Urine analysis:    Component Value  Date/Time   COLORURINE STRAW (A) 11/26/2016 0122   APPEARANCEUR CLEAR 11/26/2016 0122   LABSPEC 1.023 11/26/2016 0122   PHURINE 7.0 11/26/2016 0122   GLUCOSEU >=500 (A) 11/26/2016 0122   HGBUR NEGATIVE 11/26/2016 0122   BILIRUBINUR NEGATIVE 11/26/2016 0122   KETONESUR NEGATIVE 11/26/2016 0122   PROTEINUR NEGATIVE 11/26/2016 0122   UROBILINOGEN 1.0 02/07/2011 1605   NITRITE NEGATIVE 11/26/2016 0122   LEUKOCYTESUR NEGATIVE 11/26/2016 0122   Sepsis Labs: '@LABRCNTIP'$ (procalcitonin:4,lacticidven:4) )No results found for this or any previous visit (from the past 240 hour(s)).   Radiological Exams on Admission: No results found.  EKG: Not performed.   Assessment/Plan  1. Hyperosmolar hyperglycemic state; IDDM  - A1c was 13% two weeks ago and patient has frequent presentations with complications from uncontrolled glucose  - Serum glucose is 727 on admission with no urine ketones; no evidence for infection, acute MI, or other acute condition, and this is likely precipitated by poor adherence with his insulin  - Given 1 liter of NS in ED  - Plan to continue IVF hydration, start insulin infusion with frequent CBG's and serial chem panels    2. Hypertension with hypertensive urgency  - BP 190/90 in ED  - Was taken off of hydralazine recently  - Treat with  hydralazine IVP's for now   3. Anemia  - Hgb is stable at 9.5 and no bleeding evident  - Continue iron supplementation    DVT prophylaxis: Lovenox  Code Status: Full  Family Communication: Discussed with patient Disposition Plan: Admit to SDU Consults called: None Admission status: Inpatient    Vianne Bulls, MD [facility] Pager [phone]  If 7PM-7AM, please contact night-coverage [email or web address] Password [email or web address]  11/26/2016, 3:09 AM

## 2016-11-26 NOTE — ED Triage Notes (Signed)
Per [facility], witness at the shelter said patient fell. Patient denies fall. Patient complains of back pain. CBG showed "high, greater than 500." B/P for [facility] was  240/120. [facility] reports attempting 1 unsuccessful IV attempt.

## 2016-11-26 NOTE — ED Notes (Signed)
Patient arrived incontinent of urine and stool. Patient cleaned and placed in hospital gown.

## 2016-11-26 NOTE — ED Provider Notes (Signed)
[facility] EMERGENCY DEPARTMENT Provider Note   CSN: [id number] Arrival date & time: 11/26/16  0033     History   Chief Complaint Chief Complaint  Patient presents with  . Hypertension  . Hyperglycemia    HPI Mike [name] is a 56 y.o. male.   Patient is a 56 year old male with history of hypertension, diabetes, homelessness, and medical noncompliance.  He presents today for evaluation of not feeling well.  The patient is minimally verbal and adds little additional history.  Upon reviewing his medical record, he has been to the emergency department many, many times over the past week.  At one point he was admitted for elevated sugar.    The history is provided by the patient.    Past Medical History:  Diagnosis Date  . Anxiety   . Anxiety   . Chronic lower back pain   . Depression   . DKA (diabetic ketoacidoses) ([place]) 07/30/2016  . Hyperlipemia   . Hypertension   . Migraine    "last one was ~ 4 yr ago" (12/23/2014)  . Seizures ([place])    "related to pills for anxiety; if I don't take the pills I'm suppose to take I'll have them" (12/23/2014)  . Type II diabetes mellitus [facility])     Patient Active Problem List   Diagnosis Date Noted  . Lactic acidosis 11/18/2016  . Type 2 diabetes mellitus with hyperosmolarity without nonketotic hyperglycemic-hyperosmolar coma [facility]) ([place]) 11/06/2016  . Normocytic anemia 11/06/2016  . HLD (hyperlipidemia) 11/06/2016  . Adjustment disorder with other symptoms 11/05/2016  . CKD (chronic kidney disease) 10/22/2016  . Uncontrolled type 2 diabetes mellitus with hyperosmolar nonketotic hyperglycemia ([place]) 08/24/2016  . Essential hypertension 08/24/2016  . Acute ischemic stroke ([place])   . Acute renal failure superimposed on stage 2 chronic kidney disease ([place]) 07/31/2016  . Acute metabolic encephalopathy 76/19/5093  . DKA (diabetic ketoacidoses) ([place]) 06/24/2016  . Acute kidney injury (nontraumatic) ([place]) 06/24/2016    . Protein-calorie malnutrition, severe 12/24/2014  . Chest pain, pleuritic 12/23/2014  . Homelessness 12/23/2014  . Hypertensive urgency 12/23/2014  . DM (diabetes mellitus) ([place]) 12/23/2014  . Intermittent palpitations 12/23/2014  . Tobacco abuse 12/23/2014  . Pleuritic chest pain 12/23/2014  . Abnormal EKG 12/23/2014  . Cough   . Type II diabetes mellitus with renal manifestations [facility])     Past Surgical History:  Procedure Laterality Date  . NO PAST SURGERIES         Home Medications    Prior to Admission medications   Medication Sig Start Date End Date Taking? Authorizing Provider  aspirin 81 MG tablet Take 1 tablet (81 mg total) by mouth daily. 11/04/16 12/04/16  Doreatha Lew, MD  atorvastatin (LIPITOR) 40 MG tablet Take 1 tablet (40 mg total) daily by mouth. 11/23/16   Ward, Delice Bison, DO  blood glucose meter kit and supplies KIT Dispense based on patient and insurance preference. Use up to four times daily as directed. (FOR ICD-9 250.00, 250.01). 11/22/16   Velvet Bathe, MD  ferrous sulfate 325 (65 FE) MG tablet Take 1 tablet (325 mg total) daily by mouth. 11/23/16   Ward, Delice Bison, DO  glyBURIDE (DIABETA) 5 MG tablet Take 1 tablet (5 mg total) 2 (two) times daily with a meal by mouth. 11/24/16   Tanna Furry, MD  insulin aspart protamine- aspart (NOVOLOG MIX 70/30) (70-30) 100 UNIT/ML injection Inject 0.25 mLs (25 Units total) 2 (two) times daily with a meal into the skin.  11/24/16   Tanna Furry, MD    Family History Family History  Problem Relation Age of Onset  . Diabetes Mellitus II Mother   . Diabetes Mellitus II Father     Social History Social History   Tobacco Use  . Smoking status: Current Every Day Smoker    Packs/day: 0.10    Years: 40.00    Pack years: 4.00    Types: Cigarettes  . Smokeless tobacco: Never Used  Substance Use Topics  . Alcohol use: Yes    Alcohol/week: 1.2 oz    Types: 2 Cans of beer per week  . Drug use: No    Comment:  12/23/2014 "stopped ~ 10 yrs ago"     Allergies   Ibuprofen and Tylenol [acetaminophen]   Review of Systems Review of Systems  Unable to perform ROS: Patient nonverbal     Physical Exam Updated Vital Signs BP (!) 196/93 (BP Location: Right Arm)   Pulse 74   Temp 97.6 F (36.4 C) (Oral)   Resp 14   Ht '6\' 4"'$  (1.93 m)   Wt 77.1 kg (170 lb)   SpO2 96%   BMI 20.69 kg/m    Physical Exam  Constitutional: He is oriented to person, place, and time. He appears well-developed and well-nourished. No distress.  Patient appears somewhat disheveled.  HENT:  Head: Normocephalic and atraumatic.  Mouth/Throat: Oropharynx is clear and moist.  Eyes: EOM are normal. Pupils are equal, round, and reactive to light.  Neck: Normal range of motion. Neck supple.  Cardiovascular: Normal rate and regular rhythm. Exam reveals no friction rub.  No murmur heard. Pulmonary/Chest: Effort normal and breath sounds normal. No respiratory distress. He has no wheezes. He has no rales.  Abdominal: Soft. Bowel sounds are normal. He exhibits no distension. There is no tenderness.  Musculoskeletal: Normal range of motion. He exhibits no edema.  Neurological: He is alert and oriented to person, place, and time. No cranial nerve deficit. Coordination normal.  Patient is slow to respond, but does follow commands appropriately.  Skin: Skin is warm and dry. He is not diaphoretic.  Nursing note and vitals reviewed.    ED Treatments / Results  Labs (all labs ordered are listed, but only abnormal results are displayed) Labs Reviewed  BASIC METABOLIC PANEL  CBC  URINALYSIS, ROUTINE W REFLEX MICROSCOPIC  ETHANOL  RAPID URINE DRUG SCREEN, HOSP PERFORMED  CBG MONITORING, ED    EKG  EKG Interpretation None       Radiology No results found.  Procedures Procedures (including critical care time)  Medications Ordered in ED Medications - No data to display   Initial Impression / Assessment and Plan /  ED Course  I have reviewed the triage vital signs and the nursing notes.  Pertinent labs & imaging results that were available during my care of the patient were reviewed by me and considered in my medical decision making (see chart for details).  Patient with history of HTN and DM, homelessness, and medical non-compliance presenting with complaints of not feeling well.  He was found to have elevated blood sugars greater than 700, however no evidence for DKA.  Due to the degree of hyperglycemia, I feel as though admission for glucose stabilization is indicated.  I discussed with Dr. Myna Hidalgo who agrees to admit.  Final Clinical Impressions(s) / ED Diagnoses   Final diagnoses:  None    ED Discharge Orders    None      Veryl Speak, MD 11/26/16  0350 

## 2016-11-26 NOTE — ED Notes (Signed)
Pt found to have urinal in bed with urine soaked chuck pads under patient; pt cleaned up and dry linen and clean chuck pads applied, new urinal supplied

## 2016-11-26 NOTE — Progress Notes (Signed)
Patient seen and examined bedside.  Agree with Dr.Opyd's history and physical.  This is a 56 year old male who came in with a malaise and was found to have a blood glucose reading of high and elevated blood pressures of 200s systolic over 100s diastolic.  Agree with assessment and plan as laid out in history and physical.  Of note patient has been to the emergency department 23 times in the past 6 months.  He has been admitted 8 times in the past 6 months.  Have consulted case management and social work for assistance in both home health needs and medication as well as possible skilled nursing facility placement.  Patient states he does not have money to afford his medications at home.  He denies having access to insulin blood glucose monitoring and testing strips.  We will continue current treatment plan and monitoring of blood glucose as well as blood pressures.

## 2016-11-27 ENCOUNTER — Other Ambulatory Visit: Payer: Self-pay

## 2016-11-27 LAB — GLUCOSE, CAPILLARY
GLUCOSE-CAPILLARY: 410 mg/dL — AB (ref 65–99)
GLUCOSE-CAPILLARY: 271 mg/dL — AB (ref 65–99)
GLUCOSE-CAPILLARY: 188 mg/dL — AB (ref 65–99)
Glucose-Capillary: 302 mg/dL — ABNORMAL HIGH (ref 65–99)
Glucose-Capillary: 263 mg/dL — ABNORMAL HIGH (ref 65–99)
Glucose-Capillary: 274 mg/dL — ABNORMAL HIGH (ref 65–99)

## 2016-11-27 MED ORDER — INSULIN GLARGINE 100 UNIT/ML ~~LOC~~ SOLN
10.0000 [IU] | Freq: Every day | SUBCUTANEOUS | Status: DC
  Administered 2016-11-27 – 2016-11-28 (×2): 10 [IU] via SUBCUTANEOUS
  Filled 2016-11-27 (×3): qty 0.1

## 2016-11-27 MED ORDER — LISINOPRIL 20 MG PO TABS
20.0000 mg | ORAL_TABLET | Freq: Every day | ORAL | Status: DC
  Administered 2016-11-27 – 2016-12-04 (×8): 20 mg via ORAL
  Filled 2016-11-27 (×8): qty 1

## 2016-11-27 MED ORDER — SODIUM CHLORIDE 0.9 % IV SOLN
INTRAVENOUS | Status: DC
  Administered 2016-11-27: 06:00:00 via INTRAVENOUS

## 2016-11-27 NOTE — Progress Notes (Signed)
Called report to receiving RN on 6N.  Receiving RN requesting we hold pt for a bit if possible due to multiple admits.

## 2016-11-27 NOTE — Progress Notes (Signed)
Inpatient Diabetes Program Recommendations  AACE/ADA: New Consensus Statement on Inpatient Glycemic Control (2015)  Target Ranges:  Prepandial:   less than 140 mg/dL      Peak postprandial:   less than 180 mg/dL (1-2 hours)      Critically ill patients:  140 - 180 mg/dL   Lab Results  Component Value Date   GLUCAP 350 (H) 11/26/2016   HGBA1C 13.0 (H) 11/07/2016    Review of Glycemic Control   Results for Mike [name], Mike [name] (MRN [id number]) as of 11/27/2016 07:49  Ref. Range 11/26/2016 14:00 11/26/2016 17:15 11/26/2016 20:41 11/26/2016 22:36  Glucose-Capillary Latest Ref Range: 65 - 99 mg/dL 027129 (H) 253229 (H) 664339 (H) 350 (H)    Home DM Meds: Novolog mix 70/30 25 units bid                             Glyburide 5 mg BID  Current Insulin Orders: Lantus 10 units daily                                       Novolog Sensitive Correction Scale/ SSI (0-9 units) TID [facility]  Inpatient Diabetes Program Recommendations:  Diabetes note dated 11/07/16 states; Marland Kitchen...it may be unreasonable to send patient out on insulin since he has homeless issues and may not have the means to safely and properly store his insulin.  Lantus and Novolog will be way too expensive out of pocket.  Has received Rxs for 70/30 insulin before, however, patient does not take insulin consistently either and has no way to store if appropriately.... Would probably be best to not discharge on insulin.  Despite access to resources, shelters, assistance on orange card, disability and Medicaid information, [facility] and [employer], patient denied money, medication and access to supplies when he was admitted this visit.    For best results, patient would need to be placed in a facility where medications could be safely and routinely given.  Regardless with what medications we discharge him on, if he goes back into the street he will return with hyperglycemia.   Agree with current diabetes medication orders.   Susette RacerJulie Gaynor Genco,  RN, BA, MHA, CDE Diabetes Coordinator Inpatient Diabetes Program  [phone] (Team Pager) [phone] [facility] Office) 11/27/2016 8:02 AM

## 2016-11-27 NOTE — Care Management Note (Addendum)
Case Management Note  Patient Details  Name: Mike [name] MRN: [id number] Date of Birth: 30-Sep-1960  Subjective/Objective:         CM following for progression and d/c planning.            Action/Plan: 11/27/2016 Noted that pt has been provided with medications on multiple occasions and is no longer eligible for assistance. Pt has had multiple appointments arranged and has been provide with bus passes [facility]. Pt lack of compliance is beyond the reach of CM as pt fails to followup with d/c plans post d/c. Will continue to follow and arrange appointments at d/c.   Expected Discharge Date:                  Expected Discharge Plan:  Home/Self Care  In-House Referral:  Clinical Social Work  Discharge planning Services  CM Consult  Post Acute Care Choice:    Choice offered to:     DME Arranged:    DME Agency:     HH Arranged:    HH Agency:     Status of Service:  In process, will continue to follow  If discussed at Long Length of Stay Meetings, dates discussed:    Additional Comments:  Mike [name], Mike Deeg U, RN 11/27/2016, 12:23 PM

## 2016-11-27 NOTE — Progress Notes (Signed)
CSW received consult for SNF placment for this patient.  Per flowsheets patient is supervision only during mobility with no hands on assist- does not appear to qualify for SNF based on this information  CSW paged MD to request PT/OT eval if they believe pt is SNF appropriate  CSW signing off for now but will re-evaluate if SNF recommended by PT/OT or if other skilled needs arise  Burna SisJenna H. Brighid Koch, [name] Clinical Social Worker [phone]

## 2016-11-27 NOTE — Progress Notes (Addendum)
PROGRESS NOTE    Mike [name]  VFI:[id number] DOB: 1960-02-10 DOA: 11/26/2016 PCP: [name], [name], NP     Brief Narrative:  Mike [name] is a 56 y.o. male with medical history significant for insulin-dependent diabetes mellitus, homelessness, hypertension, and chronic anemia, now presenting to the emergency department with malaise.  EMS was called out to the patient's shelter for evaluation of malaise, found CBG to read "high" and blood pressure of 200s/100s.  He was soiled in urine and feces on arrival to ED. Upon arrival to the ED, patient is found to be afebrile, saturating adequately on room air, hypertensive to 196/93, and with vitals otherwise stable.  Chemistry panel reveals a serum glucose of 727 and an elevated BUN to creatinine ratio.  He was admitted for further evaluation and management of HHS.  Assessment & Plan:   Principal Problem:   Uncontrolled type 2 DM with hyperosmolar nonketotic hyperglycemia (HCC) Active Problems:   Homelessness   Hypertensive urgency   Uncontrolled type 2 diabetes mellitus with hyperosmolar nonketotic hyperglycemia (HCC)   Essential hypertension   Normocytic anemia   HHS -Now off insulin drip -Continue Lantus, NovoLog sliding scale -Appreciate diabetic educator  HTN -Start lisinopril   CAD -Continue aspirin, lipitor   Homelessness, medical noncompliance -Patient has been to the emergency department 23 times in the past 6 months.  He has been admitted 8 times in the past 6 months. Despite multiple CM and SW involvement, patient continues to be noncompliant, cannot tell me how many insulin doses he is supposed to give himself, does not check blood sugars regularly, and has told other providers that he is unable to obtain his medications    DVT prophylaxis: lovenox Code Status: full Family Communication: no family at bedside  Disposition Plan: pending improvement, unfortunate case as patient is homeless and highly noncompliant  with repeated ED visits and hospitalizations. Transfer to med surg today. Not ready for discharge due to continued elevated blood sugar    Consultants:   None  Procedures:   None   Antimicrobials:  Anti-infectives (From admission, onward)   None       Subjective: Has no complaints this morning.  States that he has been taking his insulin "730" referring to insulin 70/30 but does not know the dose of his insulin.  He also admits that he does not check his blood sugars on a regular basis.  He is currently homeless and on the streets.  He has no other physical complaints, feeling well.  Objective: Vitals:   11/27/16 0700 11/27/16 0800 11/27/16 0809 11/27/16 0900  BP: (!) 173/83 (!) 166/81 (!) 166/81 (!) 170/89  Pulse: (!) 58 61 62 73  Resp: 13 15 10 18   Temp:   99.2 F (37.3 C)   TempSrc:   Oral   SpO2: 98% 96% 97% 97%  Weight:      Height:        Intake/Output Summary (Last 24 hours) at 11/27/2016 1146 Last data filed at 11/27/2016 0900 Gross per 24 hour  Intake 4653.73 ml  Output 2350 ml  Net 2303.73 ml   Filed Weights   11/26/16 0050  Weight: 77.1 kg (170 lb)    Examination:  General exam: Appears calm and comfortable  Respiratory system: Clear to auscultation. Respiratory effort normal. Cardiovascular system: S1 & S2 heard, RRR. No JVD, murmurs, rubs, gallops or clicks. No pedal edema. Gastrointestinal system: Abdomen is nondistended, soft and nontender. No organomegaly or masses felt. Normal bowel sounds heard. Central  nervous system: Alert and oriented. No focal neurological deficits. Extremities: Symmetric 5 x 5 power. Skin: No rashes, lesions or ulcers Psychiatry: Judgement and insight appear poor. Mood & affect flat.   Data Reviewed: I have personally reviewed following labs and imaging studies  CBC: Recent Labs  Lab 11/23/16 0501 11/23/16 1830 11/24/16 0844 11/24/16 1847 11/26/16 0107  WBC 5.0 5.3  --   --  7.6  NEUTROABS 3.0 3.0  --   --    --   HGB 7.7* 8.6* 11.2* 10.5* 9.5*  HCT 24.5* 27.4* 33.0* 31.0* 30.8*  MCV 81.1 82.0  --   --  82.1  PLT 251 308  --   --  332   Basic Metabolic Panel: Recent Labs  Lab 11/26/16 0309 11/26/16 0613 11/26/16 1006 11/26/16 1406 11/26/16 1824  NA 144 146* 151* 147* 142  K 4.5 4.1 3.2* 3.5 3.9  CL 106 110 114* 110 106  CO2 29 28 30 30 29   GLUCOSE 604* 420* 139* 134* 238*  BUN 20 17 15 14 13   CREATININE 0.89 0.88 0.82 0.72 0.77  CALCIUM 10.0 9.9 9.8 9.2 9.1   GFR: Estimated Creatinine Clearance: 113.8 mL/min (by C-G formula based on SCr of 0.77 mg/dL). Liver Function Tests: No results for input(s): AST, ALT, ALKPHOS, BILITOT, PROT, ALBUMIN in the last 168 hours. No results for input(s): LIPASE, AMYLASE in the last 168 hours. No results for input(s): AMMONIA in the last 168 hours. Coagulation Profile: No results for input(s): INR, PROTIME in the last 168 hours. Cardiac Enzymes: No results for input(s): CKTOTAL, CKMB, CKMBINDEX, TROPONINI in the last 168 hours. BNP (last 3 results) No results for input(s): PROBNP in the last 8760 hours. HbA1C: No results for input(s): HGBA1C in the last 72 hours. CBG: Recent Labs  Lab 11/26/16 1400 11/26/16 1715 11/26/16 2041 11/26/16 2236 11/27/16 0812  GLUCAP 129* 229* 339* 350* 410*   Lipid Profile: No results for input(s): CHOL, HDL, LDLCALC, TRIG, CHOLHDL, LDLDIRECT in the last 72 hours. Thyroid Function Tests: No results for input(s): TSH, T4TOTAL, FREET4, T3FREE, THYROIDAB in the last 72 hours. Anemia Panel: No results for input(s): VITAMINB12, FOLATE, FERRITIN, TIBC, IRON, RETICCTPCT in the last 72 hours. Sepsis Labs: No results for input(s): PROCALCITON, LATICACIDVEN in the last 168 hours.  Recent Results (from the past 240 hour(s))  MRSA PCR Screening     Status: None   Collection Time: 11/26/16  5:06 AM  Result Value Ref Range Status   MRSA by PCR NEGATIVE NEGATIVE Final    Comment:        The GeneXpert MRSA Assay  (FDA approved for NASAL specimens only), is one component of a comprehensive MRSA colonization surveillance program. It is not intended to diagnose MRSA infection nor to guide or monitor treatment for MRSA infections.        Radiology Studies: No results found.    Scheduled Meds: . aspirin EC  81 mg Oral Daily  . atorvastatin  40 mg Oral q1800  . enoxaparin (LOVENOX) injection  40 mg Subcutaneous Daily  . ferrous sulfate  325 mg Oral Q breakfast  . insulin aspart  0-5 Units Subcutaneous QHS  . insulin aspart  0-9 Units Subcutaneous TID WC  . insulin glargine  10 Units Subcutaneous Daily  . lisinopril  20 mg Oral Daily   Continuous Infusions:    LOS: 1 day    Time spent: 40 minutes   Noralee StainJennifer Karista Aispuro, DO [facility] [email or web address] Password [email or web address] 11/27/2016, 11:46  AM

## 2016-11-28 LAB — BASIC METABOLIC PANEL
Anion gap: 8 (ref 5–15)
BUN: 12 mg/dL (ref 6–20)
CALCIUM: 9 mg/dL (ref 8.9–10.3)
CHLORIDE: 101 mmol/L (ref 101–111)
CO2: 26 mmol/L (ref 22–32)
CREATININE: 0.7 mg/dL (ref 0.61–1.24)
GFR calc non Af Amer: >60 mL/min (ref >60)
GLUCOSE: 277 mg/dL — AB (ref 65–99)
Potassium: 3.7 mmol/L (ref 3.5–5.1)
Sodium: 135 mmol/L (ref 135–145)

## 2016-11-28 LAB — CBC
HEMATOCRIT: 29.6 % — AB (ref 39.0–52.0)
HEMOGLOBIN: 9.4 g/dL — AB (ref 13.0–17.0)
MCH: 26.1 pg (ref 26.0–34.0)
MCHC: 31.8 g/dL (ref 30.0–36.0)
MCV: 82.2 fL (ref 78.0–100.0)
Platelets: 326 10*3/uL (ref 150–400)
RBC: 3.6 MIL/uL — ABNORMAL LOW (ref 4.22–5.81)
RDW: 15 % (ref 11.5–15.5)
WBC: 4.8 10*3/uL (ref 4.0–10.5)

## 2016-11-28 LAB — GLUCOSE, CAPILLARY
GLUCOSE-CAPILLARY: 323 mg/dL — AB (ref 65–99)
GLUCOSE-CAPILLARY: 347 mg/dL — AB (ref 65–99)
Glucose-Capillary: 324 mg/dL — ABNORMAL HIGH (ref 65–99)
Glucose-Capillary: 381 mg/dL — ABNORMAL HIGH (ref 65–99)
Glucose-Capillary: 445 mg/dL — ABNORMAL HIGH (ref 65–99)

## 2016-11-28 MED ORDER — INSULIN ASPART 100 UNIT/ML ~~LOC~~ SOLN
0.0000 [IU] | Freq: Three times a day (TID) | SUBCUTANEOUS | Status: DC
  Administered 2016-11-28: 15 [IU] via SUBCUTANEOUS
  Administered 2016-11-29: 3 [IU] via SUBCUTANEOUS
  Administered 2016-11-29: 15 [IU] via SUBCUTANEOUS
  Administered 2016-11-29: 11 [IU] via SUBCUTANEOUS
  Administered 2016-11-30: 8 [IU] via SUBCUTANEOUS
  Administered 2016-11-30 – 2016-12-01 (×2): 15 [IU] via SUBCUTANEOUS
  Administered 2016-12-01: 11 [IU] via SUBCUTANEOUS
  Administered 2016-12-01: 5 [IU] via SUBCUTANEOUS
  Administered 2016-12-02: 15 [IU] via SUBCUTANEOUS
  Administered 2016-12-02 – 2016-12-03 (×2): 8 [IU] via SUBCUTANEOUS
  Administered 2016-12-03: 3 [IU] via SUBCUTANEOUS
  Administered 2016-12-03: 15 [IU] via SUBCUTANEOUS
  Administered 2016-12-04: 5 [IU] via SUBCUTANEOUS
  Administered 2016-12-04: 3 [IU] via SUBCUTANEOUS
  Administered 2016-12-04 – 2016-12-05 (×2): 5 [IU] via SUBCUTANEOUS
  Administered 2016-12-05: 3 [IU] via SUBCUTANEOUS
  Administered 2016-12-05: 15 [IU] via SUBCUTANEOUS

## 2016-11-28 MED ORDER — HYDROCHLOROTHIAZIDE 25 MG PO TABS
25.0000 mg | ORAL_TABLET | Freq: Every day | ORAL | Status: DC
  Administered 2016-11-28 – 2016-12-06 (×9): 25 mg via ORAL
  Filled 2016-11-28 (×9): qty 1

## 2016-11-28 MED ORDER — INSULIN GLARGINE 100 UNIT/ML ~~LOC~~ SOLN
10.0000 [IU] | Freq: Two times a day (BID) | SUBCUTANEOUS | Status: DC
  Administered 2016-11-28: 10 [IU] via SUBCUTANEOUS
  Filled 2016-11-28 (×2): qty 0.1

## 2016-11-28 NOTE — Progress Notes (Signed)
PROGRESS NOTE    Mike [name]  [id number] DOB: 1960/08/05 DOA: 11/26/2016 PCP: [name], [name], NP    Brief Narrative:  [age] year old male who presented elevated glucose. Patient does have a significant past medical history for type 2 diabetes mellitus, hypertension, chronic anemia. EMS was called to the patient's shelter due to severe malaise and elevated blood glucose as well as elevated blood pressure. On the initial physical examination he was not very interactive,his blood pressure was 190/88, heart rate 65, respiratory rate 13, oxygen saturation 95%, ill looking appearing, moist mucous membranes, lungs clear to auscultation bilaterally, heart S1-S2 present rhythmic, no gallop, rubs or murmurs, abdomen soft nontender, positive lower extremity edema. Sodium 141, potassium 4.7, chloride 103, bicarbonate 27, glucose 727, BUN 34, creatinine 0.90, white count 7.6, hemoglobin 9.5, hematocrit 30.8, platelets 332. His urinalysis had greater than 500 glucose, negative protein, specific gravity 1.023, negative for infection.   Patient was admitted to the hospital working diagnosis of hyperosmolar state due to hyperglycemia, complicated by uncontrolled hypertension.   Assessment & Plan:   Principal Problem:   Uncontrolled type 2 DM with hyperosmolar nonketotic hyperglycemia (HCC) Active Problems:   Homelessness   Hypertensive urgency   Uncontrolled type 2 diabetes mellitus with hyperosmolar nonketotic hyperglycemia (HCC)   Essential hypertension   Normocytic anemia   1. Hyperosmolar state due to hyperglycemia. Patient with persistent hyperglycemia will increase insulin levimir to 10 units bid and will change insulin sliding scale to moderate sensitive. Continue carbohydrate restrictive diet.  2. Type 2 diabetes mellitus. Patient on insulin 70/30 at home, will continue to adjust insulin regimen before transition to 70/30 insulin. Will need close follow up as outpatient.   3.  Uncontrolled hypertension. Blood pressure continue to be uncontrolled with lisinopril 20 mg, will add thiazide diuretic with hctz, to improve blood pressure control.   4. Multifactorial anemia. Hb and hct stable, no need for prbc transfusion, further workup as outpatient. Will continue iron supplements.    DVT prophylaxis: enoxaparin  Code Status: full Family Communication:  No family at the bedside Disposition Plan: Home   Consultants:     Procedures:     Antimicrobials:       Subjective: Patient with no chest pain or dyspnea, no nausea or vomiting, capillary glucose has remained elevated along with blood pressure.   Objective: Vitals:   11/27/16 1647 11/27/16 2124 11/28/16 0510 11/28/16 1253  BP: (!) 161/72 (!) 169/74 (!) 165/82 (!) 170/80  Pulse: 64 66 65 68  Resp: 16 16 18 18   Temp: 98.8 F (37.1 C) 98.6 F (37 C) 98.4 F (36.9 C) 98.8 F (37.1 C)  TempSrc: Oral Oral Oral Oral  SpO2: 100% 100% 100% 100%  Weight: 74.5 kg (164 lb 3.9 oz)     Height: 6\' 4"  (1.93 m)       Intake/Output Summary (Last 24 hours) at 11/28/2016 1424 Last data filed at 11/28/2016 1145 Gross per 24 hour  Intake 720 ml  Output 3000 ml  Net -2280 ml   Filed Weights   11/26/16 0050 11/27/16 1647  Weight: 77.1 kg (170 lb) 74.5 kg (164 lb 3.9 oz)    Examination:   General: Not in pain or dyspnea, deconditioned Neurology: Awake and alert, non focal  E ENT: no pallor, no icterus, oral mucosa moist Cardiovascular: No JVD. S1-S2 present, rhythmic, no gallops, rubs, or murmurs. No lower extremity edema. Pulmonary: vesicular breath sounds bilaterally, adequate air movement, no wheezing, rhonchi or rales. Gastrointestinal. Abdomen flat, no organomegaly,  non tender, no rebound or guarding Skin. No rashes Musculoskeletal: no joint deformities     Data Reviewed: I have personally reviewed following labs and imaging studies  CBC: Recent Labs  Lab 11/23/16 0501 11/23/16 1830  11/24/16 0844 11/24/16 1847 11/26/16 0107 11/28/16 0441  WBC 5.0 5.3  --   --  7.6 4.8  NEUTROABS 3.0 3.0  --   --   --   --   HGB 7.7* 8.6* 11.2* 10.5* 9.5* 9.4*  HCT 24.5* 27.4* 33.0* 31.0* 30.8* 29.6*  MCV 81.1 82.0  --   --  82.1 82.2  PLT 251 308  --   --  332 326   Basic Metabolic Panel: Recent Labs  Lab 11/26/16 0613 11/26/16 1006 11/26/16 1406 11/26/16 1824 11/28/16 0441  NA 146* 151* 147* 142 135  K 4.1 3.2* 3.5 3.9 3.7  CL 110 114* 110 106 101  CO2 28 30 30 29 26   GLUCOSE 420* 139* 134* 238* 277*  BUN 17 15 14 13 12   CREATININE 0.88 0.82 0.72 0.77 0.70  CALCIUM 9.9 9.8 9.2 9.1 9.0   GFR: Estimated Creatinine Clearance: 109.9 mL/min (by C-G formula based on SCr of 0.7 mg/dL). Liver Function Tests: No results for input(s): AST, ALT, ALKPHOS, BILITOT, PROT, ALBUMIN in the last 168 hours. No results for input(s): LIPASE, AMYLASE in the last 168 hours. No results for input(s): AMMONIA in the last 168 hours. Coagulation Profile: No results for input(s): INR, PROTIME in the last 168 hours. Cardiac Enzymes: No results for input(s): CKTOTAL, CKMB, CKMBINDEX, TROPONINI in the last 168 hours. BNP (last 3 results) No results for input(s): PROBNP in the last 8760 hours. HbA1C: No results for input(s): HGBA1C in the last 72 hours. CBG: Recent Labs  Lab 11/27/16 1705 11/27/16 2053 11/27/16 2129 11/28/16 0738 11/28/16 1211  GLUCAP 188* 263* 274* 324* 381*   Lipid Profile: No results for input(s): CHOL, HDL, LDLCALC, TRIG, CHOLHDL, LDLDIRECT in the last 72 hours. Thyroid Function Tests: No results for input(s): TSH, T4TOTAL, FREET4, T3FREE, THYROIDAB in the last 72 hours. Anemia Panel: No results for input(s): VITAMINB12, FOLATE, FERRITIN, TIBC, IRON, RETICCTPCT in the last 72 hours.    Radiology Studies: I have reviewed all of the imaging during this hospital visit personally     Scheduled Meds: . aspirin EC  81 mg Oral Daily  . atorvastatin  40 mg Oral  q1800  . enoxaparin (LOVENOX) injection  40 mg Subcutaneous Daily  . ferrous sulfate  325 mg Oral Q breakfast  . insulin aspart  0-5 Units Subcutaneous QHS  . insulin aspart  0-9 Units Subcutaneous TID WC  . insulin glargine  10 Units Subcutaneous Daily  . lisinopril  20 mg Oral Daily   Continuous Infusions:   LOS: 2 days        Mauricio Annett Gulaaniel Arrien, MD [facility] Pager [phone]

## 2016-11-28 NOTE — Care Management Note (Signed)
Case Management Note  Patient Details  Name: Mike [name] MRN: [id number] Date of Birth: Mar 05, 1960  Subjective/Objective:                    Action/Plan:  [facility] Expected Discharge Date:                  Expected Discharge Plan:  Homeless Shelter  In-House Referral:  Clinical Social Work  Discharge planning Services  CM Consult  Post Acute Care Choice:    Choice offered to:  Patient  DME Arranged:  Gilmer Morane DME Agency:  [facility]  HH Arranged:    HH Agency:     Status of Service:  Completed, signed off  If discussed at [employer] Length of Stay Meetings, dates discussed:    Additional Comments:  Kingsley PlanWile, Linas Stepter Marie, RN 11/28/2016, 11:39 AM

## 2016-11-28 NOTE — Progress Notes (Signed)
Visited with patient and gave Advance Directives info. Told him to have chaplain called when he is ready to sign and to leave last page blank so we could have witnesses and a notary.  Patient shared he was hungry could he please have something to eat. I checked with his nurse who said he is not allowed anything to eat right now due to blood sugar. She will be coming to speak with him very soon to help him understand.  She expressed he is telling everyone who come by that he is hungry and could he please have food to eat. I shared with patient that nurse was going to come by soon and explain what is going on and why he cannot have food right now.   Phebe CollaDonna S Trust Crago, Chaplain   11/28/16 1000  Clinical Encounter Type  Visited With Patient  Visit Type Initial  Referral From Physician  Consult/Referral To Chaplain  Recommendations (Advance Directives consult)

## 2016-11-28 NOTE — Evaluation (Signed)
Occupational Therapy Evaluation Patient Details Name: Mike [name] MRN: [id number] DOB: 09/26/1960 Today's Date: 11/28/2016    History of Present Illness Pt is a 56 y/o homeless male with a PMH significant for medical noncompliance, HTN, depression, anxiety, seizure, CKD and DM. Pt has had 23 ED visits and 8 admissions in the past 6 months. He is currently admitted for uncontrolled DM and HTN.    Clinical Impression   Pt presents with decreased standing balance and impaired cognition interfering with ability to perform ADL and IADL. Pt with limited resources. Questionable likelihood that pt can manage his medical issues and medications independently. Recommending placement in a facility that may assist him.    Follow Up Recommendations  SNF, 24 hour supervision   Equipment Recommendations  None recommended by OT    Recommendations for Other Services       Precautions / Restrictions Precautions Precautions: Fall Restrictions Weight Bearing Restrictions: No      Mobility Bed Mobility Overal bed mobility: Modified Independent                Transfers Overall transfer level: Needs assistance Equipment used: Rolling walker (2 wheeled);None Transfers: Sit to/from Stand Sit to Stand: Supervision         General transfer comment: unsteady    Balance Overall balance assessment: Needs assistance Sitting-balance support: Feet supported;No upper extremity supported Sitting balance-Leahy Scale: Good     Standing balance support: No upper extremity supported;During functional activity Standing balance-Leahy Scale: Poor Standing balance comment: much safer with RW                           ADL either performed or assessed with clinical judgement   ADL Overall ADL's : Needs assistance/impaired Eating/Feeding: Independent   Grooming: Wash/dry hands;Standing;Supervision/safety   Upper Body Bathing: Set up;Sitting   Lower Body Bathing: Sit to/from  stand;Supervison/ safety   Upper Body Dressing : Set up;Sitting   Lower Body Dressing: Sit to/from stand;Supervision/safety   Toilet Transfer: Min guard;Ambulation;RW;Comfort height toilet;Minimal assistance   Toileting- Clothing Manipulation and Hygiene: Sit to/from stand;Supervision/safety       Functional mobility during ADLs: Min guard;Rolling walker General ADL Comments: required encouragment to use RW     Vision Patient Visual Report: No change from baseline       Perception     Praxis      Pertinent Vitals/Pain Pain Assessment: Faces Faces Pain Scale: No hurt     Hand Dominance Right   Extremity/Trunk Assessment Upper Extremity Assessment Upper Extremity Assessment: Overall WFL for tasks assessed   Lower Extremity Assessment Lower Extremity Assessment: Generalized weakness   Cervical / Trunk Assessment Cervical / Trunk Assessment: Other exceptions Cervical / Trunk Exceptions: Noted forward head/rounded shoulder posture   Communication Communication Communication: No difficulties   Cognition Arousal/Alertness: Awake/alert Behavior During Therapy: Flat affect Overall Cognitive Status: No family/caregiver present to determine baseline cognitive functioning Area of Impairment: Orientation;Safety/judgement;Problem solving                 Orientation Level: Disoriented to;Place;Time;Situation       Safety/Judgement: Decreased awareness of safety;Decreased awareness of deficits   Problem Solving: Slow processing General Comments: Pt focused on wanting cookies or crackers, despite being told his blood sugar was elevated   General Comments       Exercises     Shoulder Instructions      Home Living Family/patient expects to be discharged to:: Shelter/Homeless Living Arrangements:  Alone                               Additional Comments: Pt reports he will not be going to a shelter at d/c, and is unsure of where he will go.        Prior Functioning/Environment Level of Independence: Independent        Comments: does not use AD        OT Problem List: Decreased strength;Decreased activity tolerance;Impaired balance (sitting and/or standing);Decreased safety awareness;Decreased knowledge of use of DME or AE      OT Treatment/Interventions: Self-care/ADL training;Patient/family education;DME and/or AE instruction    OT Goals(Current goals can be found in the care plan section) Acute Rehab OT Goals Patient Stated Goal: not stated OT Goal Formulation: With patient Time For Goal Achievement: 12/05/16 Potential to Achieve Goals: Good ADL Goals Additional ADL Goal #1: Pt will gather items necessary for ADL safely with AD. Additional ADL Goal #2: Pt will perform ADL modified independently.  OT Frequency: Min 2X/week   Barriers to D/C: Decreased caregiver support          Co-evaluation              AM-PAC PT "6 Clicks" Daily Activity     Outcome Measure Help from another person eating meals?: None Help from another person taking care of personal grooming?: A Little Help from another person toileting, which includes using toliet, bedpan, or urinal?: A Little Help from another person bathing (including washing, rinsing, drying)?: A Little Help from another person to put on and taking off regular upper body clothing?: None Help from another person to put on and taking off regular lower body clothing?: A Little 6 Click Score: 20   End of Session Equipment Utilized During Treatment: Gait belt;Rolling walker Nurse Communication: (may not have graham crackers)  Activity Tolerance: Patient tolerated treatment well Patient left: in bed;with call bell/phone within reach  OT Visit Diagnosis: Unsteadiness on feet (R26.81)                Time: 1425-1445 OT Time Calculation (min): 20 min Charges:  OT General Charges $OT Visit: 1 Visit OT Evaluation $OT Eval Moderate Complexity: 1 Mod G-Codes:      11/28/2016 Martie RoundJulie Psalm Arman, OTR/L Pager: [phone]  Iran PlanasMayberry, [place] BailiffJulie Lynn 11/28/2016, 3:36 PM

## 2016-11-28 NOTE — Care Management Note (Signed)
Case Management Note  Patient Details  Name: Tobin ChadDesbourns Sytsma MRN: [id number] Date of Birth: 1960-12-18  Subjective/Objective:                    Action/Plan:  On previous admission [facility] and [name] for follow up and to receive medications. Confirmed with patient he is active and can [facility]. Expected Discharge Date:                  Expected Discharge Plan:  Homeless Shelter  In-House Referral:  Clinical Social Work  Discharge planning Services  CM Consult  Post Acute Care Choice:    Choice offered to:  Patient  DME Arranged:    DME Agency:     HH Arranged:    HH Agency:     Status of Service:  Completed, signed off  If discussed at [employer] Meetings, dates discussed:    Additional Comments:  Kingsley PlanWile, Calena Salem Marie, RN 11/28/2016, 11:37 AM

## 2016-11-28 NOTE — Evaluation (Signed)
Physical Therapy Evaluation Patient Details Name: Tobin ChadDesbourns Desanctis MRN: [id number] DOB: 1960-11-17 Today's Date: 11/28/2016   History of Present Illness  Pt is a 56 y/o homeless male with a PMH significant for medical noncompliance, HTN, depression, anxiety, seizure, CKD and DM. Pt has had 23 ED visits and 8 admissions in the past 6 months. He is currently admitted for uncontrolled DM and HTN.   Clinical Impression  Pt admitted with above diagnosis. Pt currently with functional limitations due to the deficits listed below (see PT Problem List). At the time of PT eval pt appeared confused and with delayed verbal response to questions. Perseverating on asking for food and drink/placing meal orders. Overall, pt unsteady with OOB mobility, and required occasional assist during ambulation. If continues to be unsteady next session, [facility] Pt will benefit from skilled PT to increase their independence and safety with mobility to allow discharge to the venue listed below.       Follow Up Recommendations No PT follow up;Supervision for mobility/OOB    Equipment Recommendations  Cane    Recommendations for Other Services       Precautions / Restrictions Precautions Precautions: Fall Restrictions Weight Bearing Restrictions: No      Mobility  Bed Mobility Overal bed mobility: Modified Independent             General bed mobility comments: Increased time required to transition to EOB. HOB flat and no use of rails.   Transfers Overall transfer level: Needs assistance Equipment used: None Transfers: Sit to/from Stand Sit to Stand: Supervision         General transfer comment: Supervision for safety. Pt appears unsteady initially upon achieving full stand.   Ambulation/Gait Ambulation/Gait assistance: Min assist;Min guard Ambulation Distance (Feet): 200 Feet Assistive device: None Gait Pattern/deviations: Step-through pattern;Decreased stride length;Staggering  left;Drifts right/left;Trunk flexed Gait velocity: Decreased Gait velocity interpretation: Below normal speed for age/gender General Gait Details: Pt unsteady with frequent staggering to the L side. Pt required assist to recover occasionally, and hands-on guarding throughout gait training.   Stairs            Wheelchair Mobility    Modified Rankin (Stroke Patients Only)       Balance Overall balance assessment: Needs assistance Sitting-balance support: Feet supported;No upper extremity supported Sitting balance-Leahy Scale: Good     Standing balance support: No upper extremity supported;During functional activity Standing balance-Leahy Scale: Poor Standing balance comment: Occasional assist required.                  Standardized Balance Assessment Standardized Balance Assessment : Dynamic Gait Index   Dynamic Gait Index Level Surface: Severe Impairment Gait with Horizontal Head Turns: Moderate Impairment Gait with Vertical Head Turns: Moderate Impairment Gait and Pivot Turn: Mild Impairment Step Over Obstacle: Mild Impairment       Pertinent Vitals/Pain Pain Assessment: Faces Faces Pain Scale: Hurts a little bit Pain Location: Pt indicating his R flank is painful (holding lateral ribs on the R side with his L hand during bed mobility). Unable to describe pain, only states "it hurts" Pain Descriptors / Indicators: Guarding Pain Intervention(s): Limited activity within patient's tolerance;Monitored during session;Repositioned    Home Living Family/patient expects to be discharged to:: Shelter/Homeless Living Arrangements: Alone               Additional Comments: Pt reports he will not be going to a shelter at d/c, and is unsure of where he will go.  Prior Function Level of Independence: Independent         Comments: does not use AD     Hand Dominance   Dominant Hand: Right    Extremity/Trunk Assessment   Upper Extremity  Assessment Upper Extremity Assessment: Defer to OT evaluation    Lower Extremity Assessment Lower Extremity Assessment: Generalized weakness    Cervical / Trunk Assessment Cervical / Trunk Assessment: Other exceptions Cervical / Trunk Exceptions: Noted forward head/rounded shoulder posture  Communication   Communication: No difficulties  Cognition Arousal/Alertness: Awake/alert Behavior During Therapy: Flat affect Overall Cognitive Status: No family/caregiver present to determine baseline cognitive functioning Area of Impairment: Orientation;Safety/judgement;Problem solving                 Orientation Level: Disoriented to;Place;Time;Situation       Safety/Judgement: Decreased awareness of safety;Decreased awareness of deficits   Problem Solving: Slow processing General Comments: Slow to respond verbally. Reports it is [id number].       General Comments      Exercises     Assessment/Plan    PT Assessment Patient needs continued PT services  PT Problem List Decreased strength;Decreased activity tolerance;Decreased balance;Decreased mobility;Decreased knowledge of use of DME;Decreased safety awareness;Decreased knowledge of precautions       PT Treatment Interventions DME instruction;Gait training;Stair training;Therapeutic activities;Functional mobility training;Therapeutic exercise;Neuromuscular re-education;Cognitive remediation;Patient/family education    PT Goals (Current goals can be found in the Care Plan section)  Acute Rehab PT Goals Patient Stated Goal: not stated PT Goal Formulation: With patient Time For Goal Achievement: 12/05/16 Potential to Achieve Goals: Good    Frequency Min 3X/week   Barriers to discharge Other (comment)(Pt homeless with no reported social support )      Co-evaluation               AM-PAC PT "6 Clicks" Daily Activity  Outcome Measure Difficulty turning over in bed (including adjusting bedclothes, sheets and  blankets)?: None Difficulty moving from lying on back to sitting on the side of the bed? : None Difficulty sitting down on and standing up from a chair with arms (e.g., wheelchair, bedside commode, etc,.)?: A Little Help needed moving to and from a bed to chair (including a wheelchair)?: A Little Help needed walking in hospital room?: A Little Help needed climbing 3-5 steps with a railing? : A Little 6 Click Score: 20    End of Session Equipment Utilized During Treatment: Gait belt Activity Tolerance: Patient tolerated treatment well Patient left: in chair;with call bell/phone within reach;with chair alarm set Nurse Communication: Mobility status PT Visit Diagnosis: Unsteadiness on feet (R26.81);Muscle weakness (generalized) (M62.81);Difficulty in walking, not elsewhere classified (R26.2)    Time: [id number] PT Time Calculation (min) (ACUTE ONLY): 21 min   Charges:   PT Evaluation $PT Eval Moderate Complexity: 1 Mod     PT G Codes:        Conni SlipperLaura Zamere Pasternak, PT, DPT Acute Rehabilitation Services Pager: [phone]   Marylynn PearsonLaura D Marra Fraga 11/28/2016, 10:18 AM

## 2016-11-29 LAB — BASIC METABOLIC PANEL
ANION GAP: 8 (ref 5–15)
BUN: 14 mg/dL (ref 6–20)
CALCIUM: 8.9 mg/dL (ref 8.9–10.3)
CO2: 26 mmol/L (ref 22–32)
Chloride: 101 mmol/L (ref 101–111)
Creatinine, Ser: 0.67 mg/dL (ref 0.61–1.24)
GLUCOSE: 156 mg/dL — AB (ref 65–99)
Potassium: 4 mmol/L (ref 3.5–5.1)
SODIUM: 135 mmol/L (ref 135–145)

## 2016-11-29 LAB — GLUCOSE, CAPILLARY
GLUCOSE-CAPILLARY: 173 mg/dL — AB (ref 65–99)
GLUCOSE-CAPILLARY: 407 mg/dL — AB (ref 65–99)
GLUCOSE-CAPILLARY: 320 mg/dL — AB (ref 65–99)
GLUCOSE-CAPILLARY: 83 mg/dL (ref 65–99)

## 2016-11-29 MED ORDER — INSULIN ASPART PROT & ASPART (70-30 MIX) 100 UNIT/ML ~~LOC~~ SUSP
15.0000 [IU] | Freq: Two times a day (BID) | SUBCUTANEOUS | Status: DC
  Administered 2016-11-29 (×2): 15 [IU] via SUBCUTANEOUS
  Filled 2016-11-29: qty 10

## 2016-11-29 NOTE — Progress Notes (Signed)
PROGRESS NOTE    Hasan Custodio  WUJ:[id number] DOB: [birth year] DOA: 11/26/2016 PCP: [name], [name], NP   Brief Narrative: Mike [name] is a 56 y.o. male who presented elevated glucose. Patient does have a significant past medical history for type 2 diabetes mellitus, hypertension, chronic anemia. EMS was called to the patient's shelter due to severe malaise and elevated blood glucose as well as elevated blood pressure. On the initial physical examination he was not very interactive,his blood pressure was 190/88, heart rate 65, respiratory rate 13, oxygen saturation 95%, ill looking appearing, moist mucous membranes, lungs clear to auscultation bilaterally, heart S1-S2 present rhythmic, no gallop, rubs or murmurs, abdomen soft nontender, positive lower extremity edema. Sodium 141, potassium 4.7, chloride 103, bicarbonate 27, glucose 727, BUN 34, creatinine 0.90, white count 7.6, hemoglobin 9.5, hematocrit 30.8, platelets 332. His urinalysis had greater than 500 glucose, negative protein, specific gravity 1.023, negative for infection.   Patient was admitted to the hospital working diagnosis of hyperosmolar state due to hyperglycemia, complicated by uncontrolled hypertension.      Assessment & Plan:   Principal Problem:   Uncontrolled type 2 DM with hyperosmolar nonketotic hyperglycemia (HCC) Active Problems:   Homelessness   Hypertensive urgency   Uncontrolled type 2 diabetes mellitus with hyperosmolar nonketotic hyperglycemia (HCC)   Essential hypertension   Normocytic anemia   Hyperosmolar state Patient required insulin drip and blood sugar improved. Initially managed with Lantus. -switch to 70/30 since this is patient's outpatient medication -unfortunately, I foresee patient bouncing right back to the hospital because he is unable to readily afford his medications and there is no help available for him to ensure good access to medications  Type 2 diabetes mellitus Last  A1C of 13% on 11/07/16. Patient needs to be on insulin.  -continue management as above  Hypertension -continue lisinopril and hydrochlorothiazide   Anemia Stable. Outpatient follow-up.   DVT prophylaxis: Lovenox Code Status: Full code Family Communication: None at bedside Disposition Plan: Discharge when medically stable   Consultants:   None  Procedures:   None  Antimicrobials:  None    Subjective: No concerns today.  Objective: Vitals:   11/28/16 1253 11/28/16 2017 11/29/16 0429 11/29/16 1434  BP: (!) 170/80 (!) 153/67 (!) 152/91 (!) 153/86  Pulse: 68 61 99 83  Resp: 18 18 16    Temp: 98.8 F (37.1 C) 98.7 F (37.1 C) 97.8 F (36.6 C) 97.9 F (36.6 C)  TempSrc: Oral Oral Oral Oral  SpO2: 100% 100% 98% 100%  Weight:      Height:        Intake/Output Summary (Last 24 hours) at 11/29/2016 1717 Last data filed at 11/29/2016 1500 Gross per 24 hour  Intake 1470 ml  Output 2050 ml  Net -580 ml   Filed Weights   11/26/16 0050 11/27/16 1647  Weight: 77.1 kg (170 lb) 74.5 kg (164 lb 3.9 oz)    Examination:  General exam: Appears calm and comfortable, disheveled.  Respiratory system: Clear to auscultation. Respiratory effort normal. Cardiovascular system: S1 & S2 heard, RRR. No murmurs. Gastrointestinal system: Abdomen is nondistended, soft and nontender. Normal bowel sounds heard. Central nervous system: Alert and oriented. No focal neurological deficits. Extremities: No edema. No calf tenderness Skin: No cyanosis. No rashes Psychiatry: Judgement and insight appear normal. Mood & affect depressed and flat.     Data Reviewed: I have personally reviewed following labs and imaging studies  CBC: Recent Labs  Lab 11/23/16 0501 11/23/16 1830 11/24/16 0844 11/24/16  1847 11/26/16 0107 11/28/16 0441  WBC 5.0 5.3  --   --  7.6 4.8  NEUTROABS 3.0 3.0  --   --   --   --   HGB 7.7* 8.6* 11.2* 10.5* 9.5* 9.4*  HCT 24.5* 27.4* 33.0* 31.0* 30.8* 29.6*    MCV 81.1 82.0  --   --  82.1 82.2  PLT 251 308  --   --  332 326   Basic Metabolic Panel: Recent Labs  Lab 11/26/16 1006 11/26/16 1406 11/26/16 1824 11/28/16 0441 11/29/16 0544  NA 151* 147* 142 135 135  K 3.2* 3.5 3.9 3.7 4.0  CL 114* 110 106 101 101  CO2 30 30 29 26 26   GLUCOSE 139* 134* 238* 277* 156*  BUN 15 14 13 12 14   CREATININE 0.82 0.72 0.77 0.70 0.67  CALCIUM 9.8 9.2 9.1 9.0 8.9   GFR: Estimated Creatinine Clearance: 109.9 mL/min (by C-G formula based on SCr of 0.67 mg/dL). Liver Function Tests: No results for input(s): AST, ALT, ALKPHOS, BILITOT, PROT, ALBUMIN in the last 168 hours. No results for input(s): LIPASE, AMYLASE in the last 168 hours. No results for input(s): AMMONIA in the last 168 hours. Coagulation Profile: No results for input(s): INR, PROTIME in the last 168 hours. Cardiac Enzymes: No results for input(s): CKTOTAL, CKMB, CKMBINDEX, TROPONINI in the last 168 hours. BNP (last 3 results) No results for input(s): PROBNP in the last 8760 hours. HbA1C: No results for input(s): HGBA1C in the last 72 hours. CBG: Recent Labs  Lab 11/28/16 1656 11/28/16 2111 11/28/16 2133 11/29/16 0742 11/29/16 1211  GLUCAP 445* 323* 347* 173* 407*   Lipid Profile: No results for input(s): CHOL, HDL, LDLCALC, TRIG, CHOLHDL, LDLDIRECT in the last 72 hours. Thyroid Function Tests: No results for input(s): TSH, T4TOTAL, FREET4, T3FREE, THYROIDAB in the last 72 hours. Anemia Panel: No results for input(s): VITAMINB12, FOLATE, FERRITIN, TIBC, IRON, RETICCTPCT in the last 72 hours. Sepsis Labs: No results for input(s): PROCALCITON, LATICACIDVEN in the last 168 hours.  Recent Results (from the past 240 hour(s))  MRSA PCR Screening     Status: None   Collection Time: 11/26/16  5:06 AM  Result Value Ref Range Status   MRSA by PCR NEGATIVE NEGATIVE Final    Comment:        The GeneXpert MRSA Assay (FDA approved for NASAL specimens only), is one component of  a comprehensive MRSA colonization surveillance program. It is not intended to diagnose MRSA infection nor to guide or monitor treatment for MRSA infections.          Radiology Studies: No results found.      Scheduled Meds: . aspirin EC  81 mg Oral Daily  . atorvastatin  40 mg Oral q1800  . enoxaparin (LOVENOX) injection  40 mg Subcutaneous Daily  . ferrous sulfate  325 mg Oral Q breakfast  . hydrochlorothiazide  25 mg Oral Daily  . insulin aspart  0-15 Units Subcutaneous TID WC  . insulin aspart protamine- aspart  15 Units Subcutaneous BID WC  . lisinopril  20 mg Oral Daily   Continuous Infusions:   LOS: 3 days     Jacquelin Hawkingalph Jerusalem Brownstein, MD [facility] 11/29/2016, 5:17 PM Pager: [phone]  If 7PM-7AM, please contact night-coverage [email or web address] Password [email or web address] 11/29/2016, 5:17 PM

## 2016-11-30 DIAGNOSIS — F1010 Alcohol abuse, uncomplicated: Secondary | ICD-10-CM

## 2016-11-30 DIAGNOSIS — Z008 Encounter for other general examination: Secondary | ICD-10-CM

## 2016-11-30 DIAGNOSIS — F17210 Nicotine dependence, cigarettes, uncomplicated: Secondary | ICD-10-CM

## 2016-11-30 LAB — GLUCOSE, CAPILLARY
GLUCOSE-CAPILLARY: 278 mg/dL — AB (ref 65–99)
GLUCOSE-CAPILLARY: 234 mg/dL — AB (ref 65–99)
Glucose-Capillary: 374 mg/dL — ABNORMAL HIGH (ref 65–99)
Glucose-Capillary: 118 mg/dL — ABNORMAL HIGH (ref 65–99)

## 2016-11-30 MED ORDER — INSULIN ASPART PROT & ASPART (70-30 MIX) 100 UNIT/ML ~~LOC~~ SUSP
25.0000 [IU] | Freq: Two times a day (BID) | SUBCUTANEOUS | Status: DC
  Administered 2016-11-30 – 2016-12-01 (×3): 25 [IU] via SUBCUTANEOUS

## 2016-11-30 MED ORDER — INSULIN ASPART PROT & ASPART (70-30 MIX) 100 UNIT/ML ~~LOC~~ SUSP: 20.0000 [IU] | Freq: Two times a day (BID) | SUBCUTANEOUS | Status: DC

## 2016-11-30 NOTE — Progress Notes (Signed)
PROGRESS NOTE    Mike [name]  WUJ:[id number] DOB: 28-Apr-1960 DOA: 11/26/2016 PCP: [name], [name], NP   Brief Narrative: Mike [name] is a 56 y.o. male who presented elevated glucose. Patient does have a significant past medical history for type 2 diabetes mellitus, hypertension, chronic anemia. EMS was called to the patient's shelter due to severe malaise and elevated blood glucose as well as elevated blood pressure. On the initial physical examination he was not very interactive,his blood pressure was 190/88, heart rate 65, respiratory rate 13, oxygen saturation 95%, ill looking appearing, moist mucous membranes, lungs clear to auscultation bilaterally, heart S1-S2 present rhythmic, no gallop, rubs or murmurs, abdomen soft nontender, positive lower extremity edema. Sodium 141, potassium 4.7, chloride 103, bicarbonate 27, glucose 727, BUN 34, creatinine 0.90, white count 7.6, hemoglobin 9.5, hematocrit 30.8, platelets 332. His urinalysis had greater than 500 glucose, negative protein, specific gravity 1.023, negative for infection.   Patient was admitted to the hospital working diagnosis of hyperosmolar state due to hyperglycemia, complicated by uncontrolled hypertension.      Assessment & Plan:   Principal Problem:   Uncontrolled type 2 DM with hyperosmolar nonketotic hyperglycemia (HCC) Active Problems:   Homelessness   Hypertensive urgency   Uncontrolled type 2 diabetes mellitus with hyperosmolar nonketotic hyperglycemia (HCC)   Essential hypertension   Normocytic anemia   Hyperosmolar state Patient required insulin drip and blood sugar improved. Initially managed with Lantus.  70/30 on 11/13.  Blood sugar uncontrolled currently. -Increase 70/30 to 25 units twice daily with meals -Continue SSI -Psychiatry consult for capacity  Type 2 diabetes mellitus Last A1C of 13% on 11/07/16. Patient needs to be on insulin.  -continue management as  above  Hypertension -continue lisinopril and hydrochlorothiazide   Anemia Stable. Outpatient follow-up.   DVT prophylaxis: Lovenox Code Status: Full code Family Communication: None at bedside Disposition Plan: Discharge when medically stable   Consultants:   None  Procedures:   None  Antimicrobials:  None    Subjective: Afebrile. No nausea or vomiting.  Objective: Vitals:   11/29/16 0429 11/29/16 1434 11/29/16 2100 11/30/16 0518  BP: (!) 152/91 (!) 153/86 (!) 144/71 (!) 167/74  Pulse: 99 83 (!) 58 (!) 55  Resp: 16   18  Temp: 97.8 F (36.6 C) 97.9 F (36.6 C) 98.7 F (37.1 C) 97.9 F (36.6 C)  TempSrc: Oral Oral Oral Oral  SpO2: 98% 100% 100% 100%  Weight:      Height:        Intake/Output Summary (Last 24 hours) at 11/30/2016 0943 Last data filed at 11/30/2016 [id number] Gross per 24 hour  Intake 1090 ml  Output 1400 ml  Net -310 ml   Filed Weights   11/26/16 0050 11/27/16 1647  Weight: 77.1 kg (170 lb) 74.5 kg (164 lb 3.9 oz)    Examination:  General exam: Appears calm and comfortable.  Respiratory system: Clear to auscultation bilaterally. Unlabored work of breathing. No wheezing or rales. Cardiovascular system: Regular rate and rhythm. Normal S1 and S2. No heart murmurs present. No extra heart sounds Gastrointestinal system: Abdomen is nondistended, soft and nontender. Normal bowel sounds heard. Central nervous system: Alert and oriented. No focal neurological deficits. Extremities: No edema. No calf tenderness Skin: No cyanosis. No rashes Psychiatry:  Mood & affect depressed and flat.     Data Reviewed: I have personally reviewed following labs and imaging studies  CBC: Recent Labs  Lab 11/23/16 1830 11/24/16 0844 11/24/16 1847 11/26/16 0107 11/28/16 0441  WBC 5.3  --   --  7.6 4.8  NEUTROABS 3.0  --   --   --   --   HGB 8.6* 11.2* 10.5* 9.5* 9.4*  HCT 27.4* 33.0* 31.0* 30.8* 29.6*  MCV 82.0  --   --  82.1 82.2  PLT 308  --   --   332 326   Basic Metabolic Panel: Recent Labs  Lab 11/26/16 1006 11/26/16 1406 11/26/16 1824 11/28/16 0441 11/29/16 0544  NA 151* 147* 142 135 135  K 3.2* 3.5 3.9 3.7 4.0  CL 114* 110 106 101 101  CO2 30 30 29 26 26   GLUCOSE 139* 134* 238* 277* 156*  BUN 15 14 13 12 14   CREATININE 0.82 0.72 0.77 0.70 0.67  CALCIUM 9.8 9.2 9.1 9.0 8.9   GFR: Estimated Creatinine Clearance: 109.9 mL/min (by C-G formula based on SCr of 0.67 mg/dL). Liver Function Tests: No results for input(s): AST, ALT, ALKPHOS, BILITOT, PROT, ALBUMIN in the last 168 hours. No results for input(s): LIPASE, AMYLASE in the last 168 hours. No results for input(s): AMMONIA in the last 168 hours. Coagulation Profile: No results for input(s): INR, PROTIME in the last 168 hours. Cardiac Enzymes: No results for input(s): CKTOTAL, CKMB, CKMBINDEX, TROPONINI in the last 168 hours. BNP (last 3 results) No results for input(s): PROBNP in the last 8760 hours. HbA1C: No results for input(s): HGBA1C in the last 72 hours. CBG: Recent Labs  Lab 11/29/16 0742 11/29/16 1211 11/29/16 1806 11/29/16 2217 11/30/16 0748  GLUCAP 173* 407* 320* 83 278*   Lipid Profile: No results for input(s): CHOL, HDL, LDLCALC, TRIG, CHOLHDL, LDLDIRECT in the last 72 hours. Thyroid Function Tests: No results for input(s): TSH, T4TOTAL, FREET4, T3FREE, THYROIDAB in the last 72 hours. Anemia Panel: No results for input(s): VITAMINB12, FOLATE, FERRITIN, TIBC, IRON, RETICCTPCT in the last 72 hours. Sepsis Labs: No results for input(s): PROCALCITON, LATICACIDVEN in the last 168 hours.  Recent Results (from the past 240 hour(s))  MRSA PCR Screening     Status: None   Collection Time: 11/26/16  5:06 AM  Result Value Ref Range Status   MRSA by PCR NEGATIVE NEGATIVE Final    Comment:        The GeneXpert MRSA Assay (FDA approved for NASAL specimens only), is one component of a comprehensive MRSA colonization surveillance program. It is  not intended to diagnose MRSA infection nor to guide or monitor treatment for MRSA infections.          Radiology Studies: No results found.      Scheduled Meds: . aspirin EC  81 mg Oral Daily  . atorvastatin  40 mg Oral q1800  . enoxaparin (LOVENOX) injection  40 mg Subcutaneous Daily  . ferrous sulfate  325 mg Oral Q breakfast  . hydrochlorothiazide  25 mg Oral Daily  . insulin aspart  0-15 Units Subcutaneous TID WC  . insulin aspart protamine- aspart  25 Units Subcutaneous BID WC  . lisinopril  20 mg Oral Daily   Continuous Infusions:   LOS: 4 days     Jacquelin Hawkingalph Emberley Kral, MD [facility] 11/30/2016, 9:43 AM Pager: [phone]  If 7PM-7AM, please contact night-coverage [email or web address] Password [email or web address] 11/30/2016, 9:43 AM

## 2016-11-30 NOTE — Progress Notes (Signed)
Physical Therapy Treatment and Discharge Patient Details Name: Mike [name] MRN: [id number] DOB: 11-Apr-1960 Today's Date: 11/30/2016    History of Present Illness Pt is a 56 y/o homeless male with a PMH significant for medical noncompliance, HTN, depression, anxiety, seizure, CKD and DM. Pt has had 23 ED visits and 8 admissions in the past 6 months. He is currently admitted for uncontrolled DM and HTN.     PT Comments    Pt progressing well from a PT standpoint. Appears slightly unsteady at times but overall does not require assistance for OOB mobility. Feel this patient is appropriate to transition to the Mobility Team for continued ambulation, and mobility tech was updated. Agree with OT that facility placement would be beneficial for management of his multiple medical issues. Will sign off at this time, however if needs change, please reconsult.   Follow Up Recommendations  Supervision/Assistance - 24 hour     Equipment Recommendations  None recommended by PT    Recommendations for Other Services       Precautions / Restrictions Precautions Precautions: Fall Restrictions Weight Bearing Restrictions: No    Mobility  Bed Mobility Overal bed mobility: Independent             General bed mobility comments: pt standing at window upon arrival, seated in chair at end of session  Transfers Overall transfer level: Independent Equipment used: None Transfers: Sit to/from Stand Sit to Stand: Supervision         General transfer comment: No assist required. No unsteadiness noted.   Ambulation/Gait Ambulation/Gait assistance: Supervision;Modified independent (Device/Increase time) Ambulation Distance (Feet): 510 Feet Assistive device: None Gait Pattern/deviations: Step-through pattern;Decreased stride length;Staggering left;Drifts right/left;Trunk flexed Gait velocity: Decreased Gait velocity interpretation: Below normal speed for age/gender General Gait Details:  Initially required supervision for safety however progressed to mod I. Pt reports he is not walking quite as well as PTA however no assist was required and general unsteadiness appears improved.    Stairs            Wheelchair Mobility    Modified Rankin (Stroke Patients Only)       Balance Overall balance assessment: Needs assistance Sitting-balance support: Feet supported;No upper extremity supported Sitting balance-Leahy Scale: Good     Standing balance support: No upper extremity supported;During functional activity Standing balance-Leahy Scale: Fair Standing balance comment: statically                 Standardized Balance Assessment Standardized Balance Assessment : Dynamic Gait Index          Cognition Arousal/Alertness: Awake/alert Behavior During Therapy: Flat affect Overall Cognitive Status: No family/caregiver present to determine baseline cognitive functioning Area of Impairment: Safety/judgement;Problem solving                         Safety/Judgement: Decreased awareness of safety;Decreased awareness of deficits   Problem Solving: Slow processing General Comments: Pt alert and oriented x4 this session, however remains flat and with slow processing.       Exercises      General Comments        Pertinent Vitals/Pain Pain Assessment: No/denies pain Faces Pain Scale: No hurt    Home Living                      Prior Function            PT Goals (current goals can now be found in the care  plan section) Acute Rehab PT Goals Patient Stated Goal: Get some Diet Coke PT Goal Formulation: With patient Time For Goal Achievement: 12/05/16 Potential to Achieve Goals: Good Progress towards PT goals: Goals met/education completed, patient discharged from PT    Frequency    Min 3X/week      PT Plan Current plan remains appropriate    Co-evaluation              AM-PAC PT "6 Clicks" Daily Activity  Outcome  Measure  Difficulty turning over in bed (including adjusting bedclothes, sheets and blankets)?: None Difficulty moving from lying on back to sitting on the side of the bed? : None Difficulty sitting down on and standing up from a chair with arms (e.g., wheelchair, bedside commode, etc,.)?: A Little Help needed moving to and from a bed to chair (including a wheelchair)?: A Little Help needed walking in hospital room?: A Little Help needed climbing 3-5 steps with a railing? : A Little 6 Click Score: 20    End of Session   Activity Tolerance: Patient tolerated treatment well Patient left: in chair;with call bell/phone within reach Nurse Communication: Mobility status PT Visit Diagnosis: Unsteadiness on feet (R26.81);Muscle weakness (generalized) (M62.81);Difficulty in walking, not elsewhere classified (R26.2)     Time: 1031-2811 PT Time Calculation (min) (ACUTE ONLY): 15 min  Charges:  $Gait Training: 8-22 mins                    G Codes:       Rolinda Roan, PT, DPT Acute Rehabilitation Services Pager: [place] 11/30/2016, 11:17 AM

## 2016-11-30 NOTE — Consult Note (Addendum)
[facility] Face-to-Face Psychiatry Consult   Reason for Consult:  Capacity evaluation to manage treatment for diabetes.  Referring Physician:  Dr. Lonny Prude  Patient Identification: Mike [name] MRN:  [id number] Principal Diagnosis: Evaluation by psychiatric service required Diagnosis:   Patient Active Problem List   Diagnosis Date Noted  . Lactic acidosis [E87.2] 11/18/2016  . Uncontrolled type 2 DM with hyperosmolar nonketotic hyperglycemia ([place]) [E11.00] 11/06/2016  . Normocytic anemia [D64.9] 11/06/2016  . HLD (hyperlipidemia) [E78.5] 11/06/2016  . Adjustment disorder with other symptoms [F43.29] 11/05/2016  . CKD (chronic kidney disease) [N18.9] 10/22/2016  . Uncontrolled type 2 diabetes mellitus with hyperosmolar nonketotic hyperglycemia ([place]) [E11.00] 08/24/2016  . Essential hypertension [I10] 08/24/2016  . Acute ischemic stroke ([place]) [I63.9]   . Acute renal failure superimposed on stage 2 chronic kidney disease ([place]) [N17.9, N18.2] 07/31/2016  . Acute metabolic encephalopathy [C14.48] 07/31/2016  . DKA (diabetic ketoacidoses) ([place]) [E13.10] 06/24/2016  . Acute kidney injury (nontraumatic) ([place]) [N17.9] 06/24/2016  . Protein-calorie malnutrition, severe [E43] 12/24/2014  . Chest pain, pleuritic [R07.81] 12/23/2014  . Homelessness [Z59.0] 12/23/2014  . Hypertensive urgency [I16.0] 12/23/2014  . DM (diabetes mellitus) ([place]) [E11.9] 12/23/2014  . Intermittent palpitations [R00.2] 12/23/2014  . Tobacco abuse [Z72.0] 12/23/2014  . Pleuritic chest pain [R07.81] 12/23/2014  . Abnormal EKG [R94.31] 12/23/2014  . Cough [R05]   . Type II diabetes mellitus with renal manifestations (HCC) [E11.29]     Total Time spent with patient: 1 hour  Subjective:   Mike [name] is a 56 y.o. male patient admitted with hyperosmolar hyperglycemic state.  HPI:   Per record review, patient is homeless. He has had 23 ED visits and 8 admissions in the past 6 months related to poorly managed diabetes.  He was evaluated by OT who recommends facility placement to have assistance with properly managing diabetes since he does not demonstrate the cognitive ability. He was determined to have capacity to make medical decisions regarding diabetic treatment on 10/11.   On interview, Mr. Bramble reports that he has not been taking his medication for diabetes because the place that he gets his medications from has limited hours of operation. He reports that he often goes to the clinic and it is closed. He identifies his medication as "7 30." He is unable to state what can happen to him if he does not take the medications. He responds with, "I cannot eat a lot of sweets." He reports that he does not check his blood glucose. He is agreeable to placement at a facility where he can receive assistance with his medications. He reports feeling better today.   Past Psychiatric History: Anxiety  Risk to Self: Is patient at risk for suicide?: No Risk to Others:  None. Denies HI.  Prior Inpatient Therapy:  Denies  Prior Outpatient Therapy:  He has received psychotropic medications for sleep and anxiety in the past.   Past Medical History:  Past Medical History:  Diagnosis Date  . Anxiety   . Anxiety   . Chronic lower back pain   . Depression   . DKA (diabetic ketoacidoses) ([place]) 07/30/2016  . Hyperlipemia   . Hypertension   . Migraine    "last one was ~ 4 yr ago" (12/23/2014)  . Seizures ([place])    "related to pills for anxiety; if I don't take the pills I'm suppose to take I'll have them" (12/23/2014)  . Type II diabetes mellitus ([place])     Past Surgical History:  Procedure Laterality Date  . NO  PAST SURGERIES     Family History:  Family History  Problem Relation Age of Onset  . Diabetes Mellitus II Mother   . Diabetes Mellitus II Father    Family Psychiatric  History: Denies  Social History:  Social History   Substance and Sexual Activity  Alcohol Use Yes  . Alcohol/week: 1.2 oz  . Types: 2 Cans  of beer per week     Social History   Substance and Sexual Activity  Drug Use No   Comment: 12/23/2014 "stopped ~ 10 yrs ago"    Social History   Socioeconomic History  . Marital status: Divorced    Spouse name: None  . Number of children: None  . Years of education: None  . Highest education level: None  Social Needs  . Financial resource strain: None  . Food insecurity - worry: None  . Food insecurity - inability: None  . Transportation needs - medical: None  . Transportation needs - non-medical: None  Occupational History  . None  Tobacco Use  . Smoking status: Current Every Day Smoker    Packs/day: 0.10    Years: 40.00    Pack years: 4.00    Types: Cigarettes  . Smokeless tobacco: Never Used  Substance and Sexual Activity  . Alcohol use: Yes    Alcohol/week: 1.2 oz    Types: 2 Cans of beer per week  . Drug use: No    Comment: 12/23/2014 "stopped ~ 10 yrs ago"  . Sexual activity: Yes    Partners: Female  Other Topics Concern  . None  Social History Narrative  . None   Additional Social History: He is homeless. He is a devotee of Christianity.  He denies alcohol or illicit substance use. He smokes up to 2 cigarettes daily.     Allergies:   Allergies  Allergen Reactions  . Ibuprofen Nausea And Vomiting and Other (See Comments)    Reaction:  Bloating   . Tylenol [Acetaminophen] Nausea And Vomiting and Other (See Comments)    Reaction:  Bloating     Labs:  Results for orders placed or performed during the hospital encounter of 11/26/16 (from the past 48 hour(s))  Glucose, capillary     Status: Abnormal   Collection Time: 11/28/16 12:11 PM  Result Value Ref Range   Glucose-Capillary 381 (H) 65 - 99 mg/dL  Glucose, capillary     Status: Abnormal   Collection Time: 11/28/16  4:56 PM  Result Value Ref Range   Glucose-Capillary 445 (H) 65 - 99 mg/dL   Comment 1 Notify RN   Glucose, capillary     Status: Abnormal   Collection Time: 11/28/16  9:11 PM  Result  Value Ref Range   Glucose-Capillary 323 (H) 65 - 99 mg/dL  Glucose, capillary     Status: Abnormal   Collection Time: 11/28/16  9:33 PM  Result Value Ref Range   Glucose-Capillary 347 (H) 65 - 99 mg/dL  Basic metabolic panel     Status: Abnormal   Collection Time: 11/29/16  5:44 AM  Result Value Ref Range   Sodium 135 135 - 145 mmol/L   Potassium 4.0 3.5 - 5.1 mmol/L   Chloride 101 101 - 111 mmol/L   CO2 26 22 - 32 mmol/L   Glucose, Bld 156 (H) 65 - 99 mg/dL   BUN 14 6 - 20 mg/dL   Creatinine, Ser 0.67 0.61 - 1.24 mg/dL   Calcium 8.9 8.9 - 10.3 mg/dL   GFR calc  non Af Amer >60 >60 mL/min   GFR calc Af Amer >60 >60 mL/min    Comment: (NOTE) The eGFR has been calculated using the CKD EPI equation. This calculation has not been validated in all clinical situations. eGFR's persistently <60 mL/min signify possible Chronic Kidney Disease.    Anion gap 8 5 - 15  Glucose, capillary     Status: Abnormal   Collection Time: 11/29/16  7:42 AM  Result Value Ref Range   Glucose-Capillary 173 (H) 65 - 99 mg/dL  Glucose, capillary     Status: Abnormal   Collection Time: 11/29/16 12:11 PM  Result Value Ref Range   Glucose-Capillary 407 (H) 65 - 99 mg/dL   Comment 1 Notify RN   Glucose, capillary     Status: Abnormal   Collection Time: 11/29/16  6:06 PM  Result Value Ref Range   Glucose-Capillary 320 (H) 65 - 99 mg/dL  Glucose, capillary     Status: None   Collection Time: 11/29/16 10:17 PM  Result Value Ref Range   Glucose-Capillary 83 65 - 99 mg/dL   Comment 1 Notify RN   Glucose, capillary     Status: Abnormal   Collection Time: 11/30/16  7:48 AM  Result Value Ref Range   Glucose-Capillary 278 (H) 65 - 99 mg/dL    Current Facility-Administered Medications  Medication Dose Route Frequency Provider Last Rate Last Dose  . aspirin EC tablet 81 mg  81 mg Oral Daily Opyd, Ilene Qua, MD   81 mg at 11/29/16 0844  . atorvastatin (LIPITOR) tablet 40 mg  40 mg Oral q1800 Opyd, Ilene Qua,  MD   40 mg at 11/29/16 1900  . enoxaparin (LOVENOX) injection 40 mg  40 mg Subcutaneous Daily Opyd, Ilene Qua, MD   40 mg at 11/29/16 0845  . ferrous sulfate tablet 325 mg  325 mg Oral Q breakfast Opyd, Ilene Qua, MD   325 mg at 11/30/16 4081  . hydrALAZINE (APRESOLINE) injection 10 mg  10 mg Intravenous Q4H PRN Opyd, Ilene Qua, MD   10 mg at 11/28/16 1259  . hydrochlorothiazide (HYDRODIURIL) tablet 25 mg  25 mg Oral Daily Tawni Millers, MD   25 mg at 11/29/16 0844  . insulin aspart (novoLOG) injection 0-15 Units  0-15 Units Subcutaneous TID WC Arrien, Jimmy Picket, MD   8 Units at 11/30/16 [phone]  . insulin aspart protamine- aspart (NOVOLOG MIX 70/30) injection 25 Units  25 Units Subcutaneous BID WC Mariel Aloe, MD   25 Units at 11/30/16 [phone]  . lisinopril (PRINIVIL,ZESTRIL) tablet 20 mg  20 mg Oral Daily Dessa Phi, DO   20 mg at 11/29/16 0845    Musculoskeletal: Strength & Muscle Tone: within normal limits Gait & Station: normal Patient leans: N/A  Psychiatric Specialty Exam: Physical Exam  Nursing note and vitals reviewed. Constitutional: He is oriented to person, place, and time. He appears well-developed and well-nourished.  HENT:  Head: Normocephalic and atraumatic.  Neck: Normal range of motion.  Musculoskeletal: Normal range of motion.  Neurological: He is alert and oriented to person, place, and time.  Skin: No rash noted.  Psychiatric: He has a normal mood and affect. His behavior is normal. Thought content normal.    Review of Systems  Gastrointestinal: Negative for nausea and vomiting.  Psychiatric/Behavioral: Negative for depression, hallucinations, substance abuse and suicidal ideas. The patient is not nervous/anxious and does not have insomnia.     Blood pressure (!) 160/68, pulse 71, temperature 98.8 F (  37.1 C), temperature source Oral, resp. rate 18, height '6\' 4"'$  (1.93 m), weight 74.5 kg (164 lb 3.9 oz), SpO2 100 %.Body mass index is 19.99 kg/m.   General Appearance: Disheveled, well developed, thin, [place] male with a hospital gown with poor dentition and long unkempt dreads who is sitting upright in a chair. NAD.   Eye Contact:  Good  Speech:  Clear and Coherent and Normal Rate  Volume:  Normal  Mood:  Euthymic  Affect:  Constricted  Thought Process:  Goal Directed and Linear  Orientation:  Full (Time, Place, and Person)  Thought Content:  Logical  Suicidal Thoughts:  No  Homicidal Thoughts:  No  Memory:  Immediate;   Fair Recent;   Fair Remote;   Fair  Judgement:  Poor  Insight:  Lacking  Psychomotor Activity:  Normal  Concentration:  Concentration: Good and Attention Span: Good  Recall:  [place] of Knowledge:  Poor  Language:  Fair  Akathisia:  No  Handed:  Right  AIMS (if indicated):   N/A  Assets:  Desire for Improvement  ADL's:  Intact  Cognition:  WNL  Sleep:   Okay   Assessment:  Mike [name] is a 56 y.o. male who was admitted with hyperosmolar hyperglycemic state due to poor medication compliance for diabetes. He does not demonstrate capacity to manage his own diabetic care. He lacks an understanding of the nature of his condition and the associated benefits versus risks of treatment for diabetes. He has had several visits to the hospital for complications related to poor medication compliance and does not appear to appreciate or understand the complications that result from lack of medication compliance. Fortunately, he is agreeable to placement in a facility that can manage his diabetes.     Treatment Plan Summary: -Patient does not have capacity to manage treatment of diabetes. Please see assessment above for details.  -Psychiatry will sign off on patient at this time. Please consult psychiatry again as needed.   Disposition: No evidence of imminent risk to self or others at present.   Patient does not meet criteria for psychiatric inpatient admission.  Faythe Dingwall, DO 11/30/2016 10:18  AM

## 2016-11-30 NOTE — Progress Notes (Signed)
Occupational Therapy Treatment Patient Details Name: Mike [name] MRN: [id number] DOB: 07-Oct-1960 Today's Date: 11/30/2016    History of present illness Pt is a 56 y/o homeless male with a PMH significant for medical noncompliance, HTN, depression, anxiety, seizure, CKD and DM. Pt has had 23 ED visits and 8 admissions in the past 6 months. He is currently admitted for uncontrolled DM and HTN.    OT comments  Pt demonstrating ability to perform basic ADL. Agree with diabetes coordinator that pt is not able to manage his disease and needs facility placement in which medications can be safely and routinely given and diet monitored as he does not demonstrate the cognitive ability to manage.  Follow Up Recommendations  SNF;Supervision/Assistance - 24 hour    Equipment Recommendations  Gilmer Mor(Cane)    Recommendations for Other Services      Precautions / Restrictions Precautions Precautions: Fall Restrictions Weight Bearing Restrictions: No       Mobility Bed Mobility               General bed mobility comments: pt standing at window upon arrival, seated in chair at end of session  Transfers Overall transfer level: Modified independent Equipment used: None                Balance Overall balance assessment: Needs assistance Sitting-balance support: Feet supported;No upper extremity supported Sitting balance-Leahy Scale: Good     Standing balance support: No upper extremity supported;During functional activity Standing balance-Leahy Scale: Fair Standing balance comment: statically                           ADL either performed or assessed with clinical judgement   ADL Overall ADL's : Needs assistance/impaired     Grooming: Oral care;Standing;Modified independent           Upper Body Dressing : Set up;Standing Upper Body Dressing Details (indicate cue type and reason): changed soiled gown     Toilet Transfer:  Supervision/safety;Ambulation Toilet Transfer Details (indicate cue type and reason): stood to urinate Toileting- Clothing Manipulation and Hygiene: Modified independent       Functional mobility during ADLs: Supervision/safety General ADL Comments: Pt walking about his room without a device, mild unsteadiness, but no overt LOB.     Vision       Perception     Praxis      Cognition Arousal/Alertness: Awake/alert Behavior During Therapy: Flat affect Overall Cognitive Status: No family/caregiver present to determine baseline cognitive functioning Area of Impairment: Safety/judgement;Problem solving                         Safety/Judgement: Decreased awareness of safety;Decreased awareness of deficits   Problem Solving: Slow processing General Comments: Pt focused on desire for more eggs, but satisfied when offered coffee.        Exercises     Shoulder Instructions       General Comments      Pertinent Vitals/ Pain       Pain Assessment: No/denies pain  Home Living                                          Prior Functioning/Environment              Frequency  Min 2X/week  Progress Toward Goals  OT Goals(current goals can now be found in the care plan section)  Progress towards OT goals: Progressing toward goals  Acute Rehab OT Goals Patient Stated Goal: not stated OT Goal Formulation: With patient Time For Goal Achievement: 12/05/16 Potential to Achieve Goals: Good  Plan Discharge plan remains appropriate    Co-evaluation                 AM-PAC PT "6 Clicks" Daily Activity     Outcome Measure   Help from another person eating meals?: None Help from another person taking care of personal grooming?: A Little Help from another person toileting, which includes using toliet, bedpan, or urinal?: A Little Help from another person bathing (including washing, rinsing, drying)?: A Little Help from another  person to put on and taking off regular upper body clothing?: None Help from another person to put on and taking off regular lower body clothing?: None 6 Click Score: 21    End of Session    OT Visit Diagnosis: Unsteadiness on feet (R26.81)   Activity Tolerance Patient tolerated treatment well   Patient Left in chair;with call bell/phone within reach   Nurse Communication          Time: [id number] OT Time Calculation (min): 13 min  Charges: OT General Charges $OT Visit: 1 Visit OT Treatments $Self Care/Home Management : 8-22 mins  11/30/2016 Martie RoundJulie Angila Wombles, OTR/L Pager: [phone]   Mike [name] 11/30/2016, 9:29 AM

## 2016-12-01 LAB — GLUCOSE, CAPILLARY
GLUCOSE-CAPILLARY: 305 mg/dL — AB (ref 65–99)
GLUCOSE-CAPILLARY: 165 mg/dL — AB (ref 65–99)
Glucose-Capillary: 233 mg/dL — ABNORMAL HIGH (ref 65–99)
Glucose-Capillary: 366 mg/dL — ABNORMAL HIGH (ref 65–99)

## 2016-12-01 MED ORDER — INSULIN ASPART PROT & ASPART (70-30 MIX) 100 UNIT/ML ~~LOC~~ SUSP
25.0000 [IU] | Freq: Every day | SUBCUTANEOUS | Status: DC
  Administered 2016-12-02 – 2016-12-08 (×7): 25 [IU] via SUBCUTANEOUS
  Filled 2016-12-01: qty 10

## 2016-12-01 MED ORDER — INSULIN ASPART PROT & ASPART (70-30 MIX) 100 UNIT/ML ~~LOC~~ SUSP
30.0000 [IU] | Freq: Every day | SUBCUTANEOUS | Status: DC
  Administered 2016-12-01 – 2016-12-07 (×7): 30 [IU] via SUBCUTANEOUS

## 2016-12-01 NOTE — Progress Notes (Signed)
PROGRESS NOTE    Mike [name]  ZOX:[id number] DOB: 1960/09/10 DOA: 11/26/2016 PCP: [name], [name], NP   Brief Narrative: Tobin ChadDesbourns Charlie is a 56 y.o. male who presented elevated glucose. Patient does have a significant past medical history for type 2 diabetes mellitus, hypertension, chronic anemia. EMS was called to the patient's shelter due to severe malaise and elevated blood glucose as well as elevated blood pressure. On the initial physical examination he was not very interactive,his blood pressure was 190/88, heart rate 65, respiratory rate 13, oxygen saturation 95%, ill looking appearing, moist mucous membranes, lungs clear to auscultation bilaterally, heart S1-S2 present rhythmic, no gallop, rubs or murmurs, abdomen soft nontender, positive lower extremity edema. Sodium 141, potassium 4.7, chloride 103, bicarbonate 27, glucose 727, BUN 34, creatinine 0.90, white count 7.6, hemoglobin 9.5, hematocrit 30.8, platelets 332. His urinalysis had greater than 500 glucose, negative protein, specific gravity 1.023, negative for infection.   Patient was admitted to the hospital working diagnosis of hyperosmolar state due to hyperglycemia, complicated by uncontrolled hypertension.      Assessment & Plan:   Principal Problem:   Evaluation by psychiatric service required Active Problems:   Homelessness   Hypertensive urgency   Uncontrolled type 2 diabetes mellitus with hyperosmolar nonketotic hyperglycemia (HCC)   Essential hypertension   Uncontrolled type 2 DM with hyperosmolar nonketotic hyperglycemia (HCC)   Normocytic anemia   Hyperosmolar state Patient required insulin drip and blood sugar improved. Initially managed with Lantus.  70/30 on 11/13.  Blood sugar uncontrolled currently, especially fasting morning CBGs. Psychiatry agrees that patient does not have capacity to manage his diabetes. -change to 70/30 25 units q breakfast and 30 units q dinner -Continue SSI -Psychiatry  consult for capacity  Type 2 diabetes mellitus Last A1C of 13% on 11/07/16. Patient needs to be on insulin.  -continue management as above  Hypertension -continue lisinopril and hydrochlorothiazide   Anemia Stable. Outpatient follow-up.   DVT prophylaxis: Lovenox Code Status: Full code Family Communication: None at bedside Disposition Plan: Discharge when medically stable   Consultants:   None  Procedures:   None  Antimicrobials:  None    Subjective: No emesis.  Objective: Vitals:   11/30/16 0957 11/30/16 1358 11/30/16 2132 12/01/16 0524  BP: (!) 160/68 (!) 151/73 (!) 164/61 (!) 160/66  Pulse: 71 80 71 60  Resp: 18 16 16 16   Temp: 98.8 F (37.1 C) 97.7 F (36.5 C) 98.5 F (36.9 C) 98.2 F (36.8 C)  TempSrc: Oral Oral Oral Oral  SpO2: 100% 99% 100% 100%  Weight:      Height:        Intake/Output Summary (Last 24 hours) at 12/01/2016 1113 Last data filed at 12/01/2016 0526 Gross per 24 hour  Intake 120 ml  Output 1100 ml  Net -980 ml   Filed Weights   11/26/16 0050 11/27/16 1647  Weight: 77.1 kg (170 lb) 74.5 kg (164 lb 3.9 oz)    Examination:  General exam: Appears calm and comfortable.  Respiratory system: Clear to auscultation bilaterally. Unlabored work of breathing. No wheezing or rales. Cardiovascular system: Regular rate and rhythm. Normal S1 and S2. No heart murmurs present. No extra heart sounds Gastrointestinal system: Abdomen is nondistended, soft and nontender. Normal bowel sounds heard. Central nervous system: Alert and oriented. No focal neurological deficits. Extremities: No edema. No calf tenderness Skin: No cyanosis. No rashes Psychiatry:  Mood & affect depressed and flat.     Data Reviewed: I have personally reviewed following  labs and imaging studies  CBC: Recent Labs  Lab 11/24/16 1847 11/26/16 0107 11/28/16 0441  WBC  --  7.6 4.8  HGB 10.5* 9.5* 9.4*  HCT 31.0* 30.8* 29.6*  MCV  --  82.1 82.2  PLT  --  332 326    Basic Metabolic Panel: Recent Labs  Lab 11/26/16 1006 11/26/16 1406 11/26/16 1824 11/28/16 0441 11/29/16 0544  NA 151* 147* 142 135 135  K 3.2* 3.5 3.9 3.7 4.0  CL 114* 110 106 101 101  CO2 30 30 29 26 26   GLUCOSE 139* 134* 238* 277* 156*  BUN 15 14 13 12 14   CREATININE 0.82 0.72 0.77 0.70 0.67  CALCIUM 9.8 9.2 9.1 9.0 8.9   GFR: Estimated Creatinine Clearance: 109.9 mL/min (by C-G formula based on SCr of 0.67 mg/dL). Liver Function Tests: No results for input(s): AST, ALT, ALKPHOS, BILITOT, PROT, ALBUMIN in the last 168 hours. No results for input(s): LIPASE, AMYLASE in the last 168 hours. No results for input(s): AMMONIA in the last 168 hours. Coagulation Profile: No results for input(s): INR, PROTIME in the last 168 hours. Cardiac Enzymes: No results for input(s): CKTOTAL, CKMB, CKMBINDEX, TROPONINI in the last 168 hours. BNP (last 3 results) No results for input(s): PROBNP in the last 8760 hours. HbA1C: No results for input(s): HGBA1C in the last 72 hours. CBG: Recent Labs  Lab 11/30/16 0748 11/30/16 1211 11/30/16 1717 11/30/16 2130 12/01/16 0743  GLUCAP 278* 374* 118* 234* 305*   Lipid Profile: No results for input(s): CHOL, HDL, LDLCALC, TRIG, CHOLHDL, LDLDIRECT in the last 72 hours. Thyroid Function Tests: No results for input(s): TSH, T4TOTAL, FREET4, T3FREE, THYROIDAB in the last 72 hours. Anemia Panel: No results for input(s): VITAMINB12, FOLATE, FERRITIN, TIBC, IRON, RETICCTPCT in the last 72 hours. Sepsis Labs: No results for input(s): PROCALCITON, LATICACIDVEN in the last 168 hours.  Recent Results (from the past 240 hour(s))  MRSA PCR Screening     Status: None   Collection Time: 11/26/16  5:06 AM  Result Value Ref Range Status   MRSA by PCR NEGATIVE NEGATIVE Final    Comment:        The GeneXpert MRSA Assay (FDA approved for NASAL specimens only), is one component of a comprehensive MRSA colonization surveillance program. It is  not intended to diagnose MRSA infection nor to guide or monitor treatment for MRSA infections.          Radiology Studies: No results found.      Scheduled Meds: . aspirin EC  81 mg Oral Daily  . atorvastatin  40 mg Oral q1800  . enoxaparin (LOVENOX) injection  40 mg Subcutaneous Daily  . ferrous sulfate  325 mg Oral Q breakfast  . hydrochlorothiazide  25 mg Oral Daily  . insulin aspart  0-15 Units Subcutaneous TID WC  . insulin aspart protamine- aspart  25 Units Subcutaneous BID WC  . lisinopril  20 mg Oral Daily   Continuous Infusions:   LOS: 5 days     Jacquelin Hawkingalph Zaidyn Claire, MD [facility] 12/01/2016, 11:13 AM Pager: [phone]  If 7PM-7AM, please contact night-coverage [email or web address] Password [email or web address] 12/01/2016, 11:13 AM

## 2016-12-01 NOTE — Social Work (Addendum)
CSW discussed case with Dr. Mal MistyNetty.  Pt is homeless that is unable to properly manage his diabetes. Pt does not qualify for skilled nursing. Pt non compliant with medical appointments and medical care.  CSW will discuss with RNCM options as patient may need to be connected to community clinic or hospital clinic to get assistance with treatment adherence. Pt amy not qualify for any other services. Pt is connected to [facility]) however appears non compliant with medication adherence and follow up.  CSW will f/u to see if there is any assistance that can be provided.   Mike BreathPatricia Glenville Espina, [name] Clinical Social Worker [phone]

## 2016-12-01 NOTE — Progress Notes (Signed)
Pt's blood sugars remain elevated as he is constantly eating and drinking unit beverages, keeps asking different caregivers for either graham crackers or sodas all day long. No evidence of learning from diabetes and diet control education

## 2016-12-02 DIAGNOSIS — E1065 Type 1 diabetes mellitus with hyperglycemia: Secondary | ICD-10-CM

## 2016-12-02 DIAGNOSIS — G3184 Mild cognitive impairment, so stated: Secondary | ICD-10-CM

## 2016-12-02 DIAGNOSIS — Z008 Encounter for other general examination: Secondary | ICD-10-CM

## 2016-12-02 LAB — GLUCOSE, CAPILLARY
GLUCOSE-CAPILLARY: 270 mg/dL — AB (ref 65–99)
GLUCOSE-CAPILLARY: 433 mg/dL — AB (ref 65–99)
GLUCOSE-CAPILLARY: 262 mg/dL — AB (ref 65–99)
Glucose-Capillary: 398 mg/dL — ABNORMAL HIGH (ref 65–99)

## 2016-12-02 LAB — BASIC METABOLIC PANEL
ANION GAP: 8 (ref 5–15)
BUN: 19 mg/dL (ref 6–20)
CHLORIDE: 99 mmol/L — AB (ref 101–111)
CO2: 28 mmol/L (ref 22–32)
Calcium: 9 mg/dL (ref 8.9–10.3)
Creatinine, Ser: 0.97 mg/dL (ref 0.61–1.24)
Glucose, Bld: 234 mg/dL — ABNORMAL HIGH (ref 65–99)
POTASSIUM: 4.3 mmol/L (ref 3.5–5.1)
SODIUM: 135 mmol/L (ref 135–145)

## 2016-12-02 LAB — CBC
HEMATOCRIT: 28.5 % — AB (ref 39.0–52.0)
HEMOGLOBIN: 8.9 g/dL — AB (ref 13.0–17.0)
MCH: 25.4 pg — ABNORMAL LOW (ref 26.0–34.0)
MCHC: 31.2 g/dL (ref 30.0–36.0)
MCV: 81.2 fL (ref 78.0–100.0)
Platelets: 286 10*3/uL (ref 150–400)
RBC: 3.51 MIL/uL — AB (ref 4.22–5.81)
RDW: 14.5 % (ref 11.5–15.5)
WBC: 5.9 10*3/uL (ref 4.0–10.5)

## 2016-12-02 MED ORDER — PREMIER PROTEIN SHAKE
11.0000 [oz_av] | Freq: Two times a day (BID) | ORAL | Status: DC
  Administered 2016-12-02 – 2016-12-22 (×36): 11 [oz_av] via ORAL
  Filled 2016-12-02 (×74): qty 325.31

## 2016-12-02 MED ORDER — PROSIGHT PO TABS
1.0000 | ORAL_TABLET | Freq: Every day | ORAL | Status: DC
  Administered 2016-12-02 – 2016-12-22 (×21): 1 via ORAL
  Filled 2016-12-02 (×21): qty 1

## 2016-12-02 NOTE — Progress Notes (Signed)
PROGRESS NOTE    Mike [name]  MRN:[id number] DOB: 08/30/1960 DOA: 11/11/2RUE:[id number] PCP: [name], [name], NP   Brief Narrative: Mike [name] is a 56 y.o. male who presented elevated glucose. Patient does have a significant past medical history for type 2 diabetes mellitus, hypertension, chronic anemia. EMS was called to the patient's shelter due to severe malaise and elevated blood glucose as well as elevated blood pressure. On the initial physical examination he was not very interactive,his blood pressure was 190/88, heart rate 65, respiratory rate 13, oxygen saturation 95%, ill looking appearing, moist mucous membranes, lungs clear to auscultation bilaterally, heart S1-S2 present rhythmic, no gallop, rubs or murmurs, abdomen soft nontender, positive lower extremity edema. Sodium 141, potassium 4.7, chloride 103, bicarbonate 27, glucose 727, BUN 34, creatinine 0.90, white count 7.6, hemoglobin 9.5, hematocrit 30.8, platelets 332. His urinalysis had greater than 500 glucose, negative protein, specific gravity 1.023, negative for infection.   Patient was admitted to the hospital working diagnosis of hyperosmolar state due to hyperglycemia, complicated by uncontrolled hypertension.      Assessment & Plan:   Principal Problem:   Evaluation by psychiatric service required Active Problems:   Homelessness   Hypertensive urgency   Uncontrolled type 2 diabetes mellitus with hyperosmolar nonketotic hyperglycemia (HCC)   Essential hypertension   Uncontrolled type 2 DM with hyperosmolar nonketotic hyperglycemia (HCC)   Normocytic anemia   Hyperosmolar state Resolved. Patient required insulin drip and blood sugar improved. Initially managed with Lantus.  70/30 on 11/13.  Blood sugar uncontrolled currently, especially fasting morning CBGs. Psychiatry agrees that patient does not have capacity to manage his diabetes. Diabetes management complicated by patient's non-adherence to a carb modified  diet. -Continue 70/30 25 units q breakfast and 30 units q dinner -Continue SSI  Type 2 diabetes mellitus Last A1C of 13% on 11/07/16. Patient needs to be on insulin.  -continue management as above  Hypertension -continue lisinopril and hydrochlorothiazide   Anemia Stable. Outpatient follow-up.  Cognitive impairment MMSE performed and patient score 20/30. Patient with a 10th grade education level. MMSE interpretation suggest mild cognitive impairment with increased odds of dementia. Results abnormal for his level of high school education.   DVT prophylaxis: Lovenox Code Status: Full code Family Communication: None at bedside Disposition Plan: Discharge when medically stable   Consultants:   None  Procedures:   None  Antimicrobials:  None    Subjective: No nausea or vomiting.  Objective: Vitals:   12/01/16 0524 12/01/16 1444 12/01/16 2135 12/02/16 0635  BP: (!) 160/66 (!) 150/62 (!) 144/86 135/80  Pulse: 60 64 79 78  Resp: 16 16 16 16   Temp: 98.2 F (36.8 C) 98 F (36.7 C) 98.2 F (36.8 C) 98.3 F (36.8 C)  TempSrc: Oral Oral Oral Oral  SpO2: 100% 100% 100% 98%  Weight:      Height:        Intake/Output Summary (Last 24 hours) at 12/02/2016 0942 Last data filed at 12/02/2016 [id number] Gross per 24 hour  Intake 320 ml  Output 900 ml  Net -580 ml   Filed Weights   11/26/16 0050 11/27/16 1647  Weight: 77.1 kg (170 lb) 74.5 kg (164 lb 3.9 oz)    Examination:  General exam: Appears calm and comfortable. Respiratory system: Clear to auscultation bilaterally. Unlabored work of breathing. No wheezing or rales. Cardiovascular system: Regular rate and rhythm. Normal S1 and S2. No heart murmurs present. No extra heart sounds Gastrointestinal system: Abdomen is nondistended, soft and nontender. Normal  bowel sounds heard. Central nervous system: Alert and oriented. No focal neurological deficits. Extremities: No edema. No calf tenderness Skin: No cyanosis. No  rashes Psychiatry:  Mood & affect depressed and flat.     Data Reviewed: I have personally reviewed following labs and imaging studies  CBC: Recent Labs  Lab 11/26/16 0107 11/28/16 0441 12/02/16 0352  WBC 7.6 4.8 5.9  HGB 9.5* 9.4* 8.9*  HCT 30.8* 29.6* 28.5*  MCV 82.1 82.2 81.2  PLT 332 326 286   Basic Metabolic Panel: Recent Labs  Lab 11/26/16 1406 11/26/16 1824 11/28/16 0441 11/29/16 0544 12/02/16 0352  NA 147* 142 135 135 135  K 3.5 3.9 3.7 4.0 4.3  CL 110 106 101 101 99*  CO2 30 29 26 26 28   GLUCOSE 134* 238* 277* 156* 234*  BUN 14 13 12 14 19   CREATININE 0.72 0.77 0.70 0.67 0.97  CALCIUM 9.2 9.1 9.0 8.9 9.0   GFR: Estimated Creatinine Clearance: 90.7 mL/min (by C-G formula based on SCr of 0.97 mg/dL). Liver Function Tests: No results for input(s): AST, ALT, ALKPHOS, BILITOT, PROT, ALBUMIN in the last 168 hours. No results for input(s): LIPASE, AMYLASE in the last 168 hours. No results for input(s): AMMONIA in the last 168 hours. Coagulation Profile: No results for input(s): INR, PROTIME in the last 168 hours. Cardiac Enzymes: No results for input(s): CKTOTAL, CKMB, CKMBINDEX, TROPONINI in the last 168 hours. BNP (last 3 results) No results for input(s): PROBNP in the last 8760 hours. HbA1C: No results for input(s): HGBA1C in the last 72 hours. CBG: Recent Labs  Lab 12/01/16 0743 12/01/16 1249 12/01/16 1701 12/01/16 2130 12/02/16 0815  GLUCAP 305* 233* 366* 165* 398*   Lipid Profile: No results for input(s): CHOL, HDL, LDLCALC, TRIG, CHOLHDL, LDLDIRECT in the last 72 hours. Thyroid Function Tests: No results for input(s): TSH, T4TOTAL, FREET4, T3FREE, THYROIDAB in the last 72 hours. Anemia Panel: No results for input(s): VITAMINB12, FOLATE, FERRITIN, TIBC, IRON, RETICCTPCT in the last 72 hours. Sepsis Labs: No results for input(s): PROCALCITON, LATICACIDVEN in the last 168 hours.  Recent Results (from the past 240 hour(s))  MRSA PCR Screening      Status: None   Collection Time: 11/26/16  5:06 AM  Result Value Ref Range Status   MRSA by PCR NEGATIVE NEGATIVE Final    Comment:        The GeneXpert MRSA Assay (FDA approved for NASAL specimens only), is one component of a comprehensive MRSA colonization surveillance program. It is not intended to diagnose MRSA infection nor to guide or monitor treatment for MRSA infections.          Radiology Studies: No results found.      Scheduled Meds: . aspirin EC  81 mg Oral Daily  . atorvastatin  40 mg Oral q1800  . enoxaparin (LOVENOX) injection  40 mg Subcutaneous Daily  . ferrous sulfate  325 mg Oral Q breakfast  . hydrochlorothiazide  25 mg Oral Daily  . insulin aspart  0-15 Units Subcutaneous TID WC  . insulin aspart protamine- aspart  25 Units Subcutaneous Q breakfast   And  . insulin aspart protamine- aspart  30 Units Subcutaneous Q supper  . lisinopril  20 mg Oral Daily  . multivitamin  1 tablet Oral Daily  . protein supplement shake  11 oz Oral BID BM   Continuous Infusions:   LOS: 6 days   Time spent: 35 minutes with at least 50% face-to-face.  Jacquelin Hawking, MD [facility] 12/02/2016, 9:42 AM Pager: [phone]  If 7PM-7AM, please contact night-coverage [email or web address] Password [email or web address] 12/02/2016, 9:42 AM

## 2016-12-02 NOTE — Progress Notes (Signed)
CBG=433  at this time. Pt ordered an early dinner and CBG result taken shortly after he finished his dinner. Will cover with SSI.

## 2016-12-02 NOTE — Plan of Care (Signed)
  Clinical Measurements: Ability to maintain clinical measurements within normal limits will improve 12/02/2016 [id number] - Not Progressing by Olena Materobinson, Catlin Doria G, RN Note Pt. non compliant- constantly asking staff members for food/drink; RN informed pt. about how this will affect his BS.

## 2016-12-03 LAB — GLUCOSE, RANDOM: GLUCOSE: 455 mg/dL — AB (ref 65–99)

## 2016-12-03 LAB — GLUCOSE, CAPILLARY
GLUCOSE-CAPILLARY: 582 mg/dL — AB (ref 65–99)
GLUCOSE-CAPILLARY: 163 mg/dL — AB (ref 65–99)
GLUCOSE-CAPILLARY: 296 mg/dL — AB (ref 65–99)
Glucose-Capillary: 115 mg/dL — ABNORMAL HIGH (ref 65–99)

## 2016-12-03 LAB — CREATININE, SERUM
Creatinine, Ser: 0.99 mg/dL (ref 0.61–1.24)
GFR calc non Af Amer: >60 mL/min (ref >60)

## 2016-12-03 NOTE — Progress Notes (Signed)
Pt CBG [phone] paged Dr Mal MistyNetty.  Pt educated about high sugar content in crackers in cookies he was eating last night.

## 2016-12-03 NOTE — Progress Notes (Signed)
Spoke with Dr Mal MistyNetty. Give 15 unit Novalog. Obtain stat blood glucose lab. Strict I&O. NT and pt aware.

## 2016-12-03 NOTE — Progress Notes (Signed)
PROGRESS NOTE    Dyllan Hoeffner  WUJ:[id number] DOB: 20-Jul-1960 DOA: 11/26/2016 PCP: [name], [name], NP   Brief Narrative: Mike [name] is a 56 y.o. male who presented elevated glucose. Patient does have a significant past medical history for type 2 diabetes mellitus, hypertension, chronic anemia. EMS was called to the patient's shelter due to severe malaise and elevated blood glucose as well as elevated blood pressure. On the initial physical examination he was not very interactive,his blood pressure was 190/88, heart rate 65, respiratory rate 13, oxygen saturation 95%, ill looking appearing, moist mucous membranes, lungs clear to auscultation bilaterally, heart S1-S2 present rhythmic, no gallop, rubs or murmurs, abdomen soft nontender, positive lower extremity edema. Sodium 141, potassium 4.7, chloride 103, bicarbonate 27, glucose 727, BUN 34, creatinine 0.90, white count 7.6, hemoglobin 9.5, hematocrit 30.8, platelets 332. His urinalysis had greater than 500 glucose, negative protein, specific gravity 1.023, negative for infection.   Patient was admitted to the hospital working diagnosis of hyperosmolar state due to hyperglycemia, complicated by uncontrolled hypertension.   He has been transitioned to subcutaneous insulin and diabetes has been uncontrolled secondary to non-adherence to diet. Patient has significant impairment of ability to manage his diabetes secondary to cognitive impairment. Cannot safely discharge patient to manage his diabetes on his own. Cannot safely discharge patient with a prescription of insulin for him to self administer. Insulin is standard of care for treatment of this patient's diabetes with A1C of 13%    Assessment & Plan:   Principal Problem:   Uncontrolled type 2 diabetes mellitus with hyperosmolar nonketotic hyperglycemia (HCC) Active Problems:   Homelessness   Hypertensive urgency   Essential hypertension   Uncontrolled type 2 DM with  hyperosmolar nonketotic hyperglycemia (HCC)   Normocytic anemia   Evaluation by psychiatric service required   Hyperosmolar state Resolved. Patient required insulin drip and blood sugar improved. Initially managed with Lantus.  70/30 on 11/13.  Blood sugar uncontrolled currently, especially fasting morning CBGs. Psychiatry agrees that patient does not have capacity to manage his diabetes. Diabetes management complicated by patient's non-adherence to a carb modified diet. -Continue 70/30 25 units q breakfast and 30 units q dinner -Continue SSI -Continued re-education on proper diet choices by myself and nursing staff  Type 2 diabetes mellitus Last A1C of 13% on 11/07/16. Patient needs to be on insulin.  -continue management as above  Hypertension Not well controlled -continue lisinopril and hydrochlorothiazide  -if continues to be elevated, will titrate up lisinopril  Anemia Stable. Outpatient follow-up.  Mild cognitive impairment MMSE performed on 11/17 and patient scored 20/30. Patient with a 10th grade education level. MMSE interpretation suggest mild cognitive impairment with increased odds of dementia. Results abnormal for his level of high school education. This problem is complicating his ability to manage his diabetes adequately and safely. This coincides with psychiatry's assessment of patient's lack of capacity to manage his disease.   DVT prophylaxis: Lovenox Code Status: Full code Family Communication: None at bedside Disposition Plan: Discharge when medically stable   Consultants:   None  Procedures:   None  Antimicrobials:  None   Subjective: No nausea or vomiting. Afebrile.  Objective: Vitals:   12/02/16 1448 12/02/16 2104 12/02/16 2246 12/03/16 0604  BP: (!) 148/77 (!) 175/69 (!) 150/47 (!) 180/79  Pulse: 74 87 90 76  Resp: 16 16 16 16   Temp: 99.3 F (37.4 C) 99.1 F (37.3 C)  98.3 F (36.8 C)  TempSrc: Oral Oral    SpO2: 100%  100% 100% 100%    Weight:      Height:        Intake/Output Summary (Last 24 hours) at 12/03/2016 1102 Last data filed at 12/03/2016 0848 Gross per 24 hour  Intake 1146 ml  Output 3050 ml  Net -1904 ml   Filed Weights   11/26/16 0050 11/27/16 1647  Weight: 77.1 kg (170 lb) 74.5 kg (164 lb 3.9 oz)    Examination:  General exam: Appears calm and comfortable. Respiratory system: Unlabored work of breathing. Central nervous system: Alert and oriented. No focal neurological deficits. Psychiatry:  Mood & affect depressed and flat.     Data Reviewed: I have personally reviewed following labs and imaging studies  CBC: Recent Labs  Lab 11/28/16 0441 12/02/16 0352  WBC 4.8 5.9  HGB 9.4* 8.9*  HCT 29.6* 28.5*  MCV 82.2 81.2  PLT 326 286   Basic Metabolic Panel: Recent Labs  Lab 11/26/16 1406 11/26/16 1824 11/28/16 0441 11/29/16 0544 12/02/16 0352 12/03/16 0406  NA 147* 142 135 135 135  --   K 3.5 3.9 3.7 4.0 4.3  --   CL 110 106 101 101 99*  --   CO2 30 29 26 26 28   --   GLUCOSE 134* 238* 277* 156* 234*  --   BUN 14 13 12 14 19   --   CREATININE 0.72 0.77 0.70 0.67 0.97 0.99  CALCIUM 9.2 9.1 9.0 8.9 9.0  --    GFR: Estimated Creatinine Clearance: 88.8 mL/min (by C-G formula based on SCr of 0.99 mg/dL).  CBG: Recent Labs  Lab 12/02/16 0815 12/02/16 1153 12/02/16 1655 12/02/16 2123 12/03/16 0806  GLUCAP 398* 270* 433* 262* 582*    Recent Results (from the past 240 hour(s))  MRSA PCR Screening     Status: None   Collection Time: 11/26/16  5:06 AM  Result Value Ref Range Status   MRSA by PCR NEGATIVE NEGATIVE Final    Comment:        The GeneXpert MRSA Assay (FDA approved for NASAL specimens only), is one component of a comprehensive MRSA colonization surveillance program. It is not intended to diagnose MRSA infection nor to guide or monitor treatment for MRSA infections.          Radiology Studies: No results found.      Scheduled Meds: . aspirin  EC  81 mg Oral Daily  . atorvastatin  40 mg Oral q1800  . enoxaparin (LOVENOX) injection  40 mg Subcutaneous Daily  . ferrous sulfate  325 mg Oral Q breakfast  . hydrochlorothiazide  25 mg Oral Daily  . insulin aspart  0-15 Units Subcutaneous TID WC  . insulin aspart protamine- aspart  25 Units Subcutaneous Q breakfast   And  . insulin aspart protamine- aspart  30 Units Subcutaneous Q supper  . lisinopril  20 mg Oral Daily  . multivitamin  1 tablet Oral Daily  . protein supplement shake  11 oz Oral BID BM   Continuous Infusions:   LOS: 7 days     Jacquelin Hawkingalph Wren Gallaga, MD [facility] 12/03/2016, 11:02 AM Pager: [phone]  If 7PM-7AM, please contact night-coverage [email or web address] Password [email or web address] 12/03/2016, 11:02 AM

## 2016-12-04 LAB — GLUCOSE, CAPILLARY
GLUCOSE-CAPILLARY: 216 mg/dL — AB (ref 65–99)
GLUCOSE-CAPILLARY: 248 mg/dL — AB (ref 65–99)
Glucose-Capillary: 185 mg/dL — ABNORMAL HIGH (ref 65–99)
Glucose-Capillary: 155 mg/dL — ABNORMAL HIGH (ref 65–99)

## 2016-12-04 MED ORDER — LISINOPRIL 40 MG PO TABS
40.0000 mg | ORAL_TABLET | Freq: Every day | ORAL | Status: DC
  Administered 2016-12-05 – 2016-12-22 (×18): 40 mg via ORAL
  Filled 2016-12-04 (×18): qty 1

## 2016-12-04 NOTE — Care Management Note (Addendum)
Case Management Note  Patient Details  Name: Mike [name] MRN: [id number] Date of Birth: Jun 12, 1960  Subjective/Objective:                    Action/Plan: Dr Jacky KindleAronson and Dr Tish Frederickson Nettey discussed case. Awaiting call back. Called Dr Jacky KindleAronson ( medical Director) to discuss discharge plan.  Expected Discharge Date:                  Expected Discharge Plan:  Homeless Shelter  In-House Referral:  Clinical Social Work  Discharge planning Services  CM Consult  Post Acute Care Choice:    Choice offered to:  Patient  DME Arranged:  [facility] DME Agency:  [facility]  HH Arranged:    HH Agency:     Status of Service:  In process, will continue to follow  If discussed at Long Length of Stay Meetings, dates discussed:    Additional Comments:  Kingsley PlanWile, Noriah Osgood Marie, RN 12/04/2016, 9:36 AM

## 2016-12-04 NOTE — Progress Notes (Signed)
PROGRESS NOTE    Mike [name]  ION:[id number] DOB: 10/10/1960 DOA: 11/26/2016 PCP: [name], [name], NP   Brief Narrative: Mike [name] is a 56 y.o. male who presented elevated glucose. Patient does have a significant past medical history for type 2 diabetes mellitus, hypertension, chronic anemia. EMS was called to the patient's shelter due to severe malaise and elevated blood glucose as well as elevated blood pressure. On the initial physical examination he was not very interactive,his blood pressure was 190/88, heart rate 65, respiratory rate 13, oxygen saturation 95%, ill looking appearing, moist mucous membranes, lungs clear to auscultation bilaterally, heart S1-S2 present rhythmic, no gallop, rubs or murmurs, abdomen soft nontender, positive lower extremity edema. Sodium 141, potassium 4.7, chloride 103, bicarbonate 27, glucose 727, BUN 34, creatinine 0.90, white count 7.6, hemoglobin 9.5, hematocrit 30.8, platelets 332. His urinalysis had greater than 500 glucose, negative protein, specific gravity 1.023, negative for infection.   Patient was admitted to the hospital working diagnosis of hyperosmolar state due to hyperglycemia, complicated by uncontrolled hypertension.   He has been transitioned to subcutaneous insulin and diabetes has been uncontrolled secondary to non-adherence to diet. Patient has significant impairment of ability to manage his diabetes secondary to cognitive impairment. Cannot safely discharge patient to manage his diabetes on his own. Cannot safely discharge patient with a prescription of insulin for him to self administer. Insulin is standard of care for treatment of this patient's diabetes with A1C of 13%    Assessment & Plan:   Principal Problem:   Uncontrolled type 2 diabetes mellitus with hyperosmolar nonketotic hyperglycemia (HCC) Active Problems:   Homelessness   Hypertensive urgency   Essential hypertension   Uncontrolled type 2 DM with  hyperosmolar nonketotic hyperglycemia (HCC)   Normocytic anemia   Evaluation by psychiatric service required   Hyperosmolar state Resolved. Patient required insulin drip and blood sugar improved. Initially managed with Lantus.  70/30 on 11/13.  Blood sugar uncontrolled currently, especially fasting morning CBGs. Psychiatry agrees that patient does not have capacity to manage his diabetes. Diabetes management complicated by patient's non-adherence to a carb modified diet. Better controlled over last 24 hours -Continue 70/30 25 units q breakfast and 30 units q dinner -Continue SSI -Continued re-education on proper diet choices by myself and nursing staff  Type 2 diabetes mellitus Last A1C of 13% on 11/07/16. Patient needs to be on insulin.  -continue management as above  Hypertension Not well controlled -increase to lisinopril 40 mg daily and continue hydrochlorothiazide   Anemia Stable. Outpatient follow-up.  Mild cognitive impairment MMSE performed on 11/17 and patient scored 20/30. Patient with a 10th grade education level. MMSE interpretation suggest mild cognitive impairment with increased odds of dementia. Results abnormal for his level of high school education. This problem is complicating his ability to manage his diabetes adequately and safely. This coincides with psychiatry's assessment of patient's lack of capacity to manage his disease.   DVT prophylaxis: Lovenox Code Status: Full code Family Communication: None at bedside Disposition Plan: Discharge when medically stable and has safe discharge plan   Consultants:   None  Procedures:   None  Antimicrobials:  None   Subjective: No concerns today  Objective: Vitals:   12/03/16 1511 12/03/16 2049 12/03/16 2228 12/04/16 0525  BP: (!) 182/76 (!) 171/71 (!) 162/57 (!) 165/59  Pulse: 81 83  63  Resp: 16 16  16   Temp: 99.2 F (37.3 C) 98.6 F (37 C)  98.5 F (36.9 C)  TempSrc: Oral Oral  Oral  SpO2: 100%  100%  98%  Weight:      Height:        Intake/Output Summary (Last 24 hours) at 12/04/2016 0922 Last data filed at 12/04/2016 0525 Gross per 24 hour  Intake 952 ml  Output 3151 ml  Net -2199 ml   Filed Weights   11/26/16 0050 11/27/16 1647  Weight: 77.1 kg (170 lb) 74.5 kg (164 lb 3.9 oz)    Examination:  General exam: Appears calm and comfortable. Respiratory system: Unlabored work of breathing. Central nervous system: Alert and oriented. No focal neurological deficits. Psychiatry:  Mood & affect depressed and flat.     Data Reviewed: I have personally reviewed following labs and imaging studies  CBC: Recent Labs  Lab 11/28/16 0441 12/02/16 0352  WBC 4.8 5.9  HGB 9.4* 8.9*  HCT 29.6* 28.5*  MCV 82.2 81.2  PLT 326 286   Basic Metabolic Panel: Recent Labs  Lab 11/28/16 0441 11/29/16 0544 12/02/16 0352 12/03/16 0406 12/03/16 1034  NA 135 135 135  --   --   K 3.7 4.0 4.3  --   --   CL 101 101 99*  --   --   CO2 26 26 28   --   --   GLUCOSE 277* 156* 234*  --  455*  BUN 12 14 19   --   --   CREATININE 0.70 0.67 0.97 0.99  --   CALCIUM 9.0 8.9 9.0  --   --    GFR: Estimated Creatinine Clearance: 88.8 mL/min (by C-G formula based on SCr of 0.99 mg/dL).  CBG: Recent Labs  Lab 12/03/16 0806 12/03/16 1224 12/03/16 1734 12/03/16 2133 12/04/16 0753  GLUCAP 582* 163* 296* 115* 216*    Recent Results (from the past 240 hour(s))  MRSA PCR Screening     Status: None   Collection Time: 11/26/16  5:06 AM  Result Value Ref Range Status   MRSA by PCR NEGATIVE NEGATIVE Final    Comment:        The GeneXpert MRSA Assay (FDA approved for NASAL specimens only), is one component of a comprehensive MRSA colonization surveillance program. It is not intended to diagnose MRSA infection nor to guide or monitor treatment for MRSA infections.          Radiology Studies: No results found.      Scheduled Meds: . aspirin EC  81 mg Oral Daily  .  atorvastatin  40 mg Oral q1800  . enoxaparin (LOVENOX) injection  40 mg Subcutaneous Daily  . ferrous sulfate  325 mg Oral Q breakfast  . hydrochlorothiazide  25 mg Oral Daily  . insulin aspart  0-15 Units Subcutaneous TID WC  . insulin aspart protamine- aspart  25 Units Subcutaneous Q breakfast   And  . insulin aspart protamine- aspart  30 Units Subcutaneous Q supper  . [START ON 12/05/2016] lisinopril  40 mg Oral Daily  . multivitamin  1 tablet Oral Daily  . protein supplement shake  11 oz Oral BID BM   Continuous Infusions:   LOS: 8 days     Jacquelin Hawkingalph Matisha Termine, MD [facility] 12/04/2016, 9:22 AM Pager: [phone]  If 7PM-7AM, please contact night-coverage [email or web address] Password [email or web address] 12/04/2016, 9:22 AM

## 2016-12-04 NOTE — Progress Notes (Signed)
Occupational Therapy Treatment and Discharge Patient Details Name: Mike [name] MRN: [id number] DOB: [birth year] Today's Date: 12/04/2016    History of present illness Pt is a 56 y/o homeless male with a PMH significant for medical noncompliance, HTN, depression, anxiety, seizure, CKD and DM. Pt has had 23 ED visits and 8 admissions in the past 6 months. He is currently admitted for uncontrolled DM and HTN.    OT comments  Pt demonstrating independence in basic ADL and mobility. Continuously asks for food and beverages despite being educated in rationale (elevated blood sugar). Administered Montreal Cognitive Assessment with pt scoring 8/30 (26 being WNL). Pt demonstrating impairment in executive functioning, naming, memory and abstract thinking. When asked what pt needs in order to not return to the hospital, he was not able to state his need for a home, medicine, food. He stated he returns to the hospital when he "walks around too long and his blood sugar drops." Recommending custodial care.  Follow Up Recommendations  SNF;Supervision/Assistance - 24 hour    Equipment Recommendations  None recommended by OT    Recommendations for Other Services      Precautions / Restrictions Precautions Precautions: None       Mobility Bed Mobility Overal bed mobility: Independent                Transfers Overall transfer level: Independent Equipment used: None                  Balance     Sitting balance-Leahy Scale: Good       Standing balance-Leahy Scale: Good                             ADL either performed or assessed with clinical judgement   ADL Overall ADL's : Modified independent                                     Functional mobility during ADLs: Independent General ADL Comments: [facility]     Vision       Perception     Praxis      Cognition Arousal/Alertness: Awake/alert Behavior During Therapy: Flat  affect Overall Cognitive Status: Impaired/Different from baseline Area of Impairment: Safety/judgement;Problem solving;Orientation;Memory                 Orientation Level: Disoriented to;Time   Memory: Decreased short-term memory   Safety/Judgement: Decreased awareness of deficits;Decreased awareness of safety   Problem Solving: Slow processing;Decreased initiation;Difficulty sequencing General Comments: Pt repeatedly asking staff for food/drink despite being told by RN that his blood sugar is elevated and he may not have.        Exercises     Shoulder Instructions       General Comments      Pertinent Vitals/ Pain       Pain Assessment: No/denies pain  Home Living                                          Prior Functioning/Environment              Frequency           Progress Toward Goals  OT Goals(current goals can now be found in the care  plan section)  Progress towards OT goals: Progressing toward goals  Acute Rehab OT Goals Patient Stated Goal: get a drink/snack Time For Goal Achievement: 12/05/16  Plan Discharge plan remains appropriate    Co-evaluation                 AM-PAC PT "6 Clicks" Daily Activity     Outcome Measure   Help from another person eating meals?: None Help from another person taking care of personal grooming?: None Help from another person toileting, which includes using toliet, bedpan, or urinal?: None Help from another person bathing (including washing, rinsing, drying)?: None Help from another person to put on and taking off regular upper body clothing?: None Help from another person to put on and taking off regular lower body clothing?: None 6 Click Score: 24    End of Session    OT Visit Diagnosis: Other symptoms and signs involving cognitive function   Activity Tolerance Patient tolerated treatment well   Patient Left in chair;with call bell/phone within reach;with family/visitor  present(SW)   Nurse Communication (may not have milk)        Time: 1510-1535 OT Time Calculation (min): 25 min  Charges: OT General Charges $OT Visit: 1 Visit OT Treatments $Self Care/Home Management : 8-22 mins $Therapeutic Activity: 8-22 mins  12/04/2016 Martie RoundJulie Annalisse Minkoff, OTR/L Pager: [phone]   Mike [name], Mike [name] 12/04/2016, 3:53 PM

## 2016-12-04 NOTE — Social Work (Addendum)
CSW discussed case with Clinical Director Hope R to discuss options and interventions for patient given clinical team's concerns. CSW will meet with client and f/u with APS to see if they would intervene for patient and take the case.  CSW left a message for a call back with [facility] [place] on 12/05/16. CSW will continue to follow up.  CSW met with patient and had a lengthy discussion with patient. Patient is from [place] and migrated to the [place] about 10 years ago with a male girlfriend to [place]. They then moved to [place] to start a business. The relationship ended about 5 years ago and the male relocated to another state. At that time, patient fell on hard times and lost his apartment and has not been able to recover. He indicated that he has no family here in [place] and denies having other family here in the [place] or in his country. He confirmed that he has 82 children-47 year old and 23 month old with a male in [place]. He stated he cannot stay with her because she has mental health issues and receives assistance from the county for housing. He states that he stays in the street or at shelters. He confirmed that he receives food stamps $100 monthly. He indicated that he typically would get his medications from [facility].  CSW contacted [employer] and left message for the medical clinical Medical Case Manager Angela Nevin to discuss her role and options in terms of daily medical needs of patient. CSW will continue to follow.  Elissa Hefty, [name] Clinical Social Worker [phone]

## 2016-12-05 LAB — GLUCOSE, CAPILLARY
GLUCOSE-CAPILLARY: 247 mg/dL — AB (ref 65–99)
GLUCOSE-CAPILLARY: 183 mg/dL — AB (ref 65–99)
GLUCOSE-CAPILLARY: 382 mg/dL — AB (ref 65–99)
Glucose-Capillary: 389 mg/dL — ABNORMAL HIGH (ref 65–99)

## 2016-12-05 NOTE — Plan of Care (Signed)
  Nutrition: Adequate nutrition will be maintained 12/05/2016 0059 - Progressing by Antionette CharPeng, Zakiah Gauthreaux P, RN

## 2016-12-05 NOTE — Progress Notes (Addendum)
PROGRESS NOTE    Mike [name]  NUU:[id number] DOB: 1960/06/17 DOA: 11/26/2016 PCP: [name], [name], NP   Brief Narrative: Mike [name] is a 56 y.o. male who presented elevated glucose. Patient does have a significant past medical history for type 2 diabetes mellitus, hypertension, chronic anemia. EMS was called to the patient's shelter due to severe malaise and elevated blood glucose as well as elevated blood pressure. On the initial physical examination he was not very interactive,his blood pressure was 190/88, heart rate 65, respiratory rate 13, oxygen saturation 95%, ill looking appearing, moist mucous membranes, lungs clear to auscultation bilaterally, heart S1-S2 present rhythmic, no gallop, rubs or murmurs, abdomen soft nontender, positive lower extremity edema. Sodium 141, potassium 4.7, chloride 103, bicarbonate 27, glucose 727, BUN 34, creatinine 0.90, white count 7.6, hemoglobin 9.5, hematocrit 30.8, platelets 332. His urinalysis had greater than 500 glucose, negative protein, specific gravity 1.023, negative for infection.   Patient was admitted to the hospital working diagnosis of hyperosmolar state due to hyperglycemia, complicated by uncontrolled hypertension.   He has been transitioned to subcutaneous insulin and diabetes has been uncontrolled secondary to non-adherence to diet. Patient has significant impairment of ability to manage his diabetes secondary to cognitive impairment. Cannot safely discharge patient to manage his diabetes on his own. Cannot safely discharge patient with a prescription of insulin for him to self administer. Insulin is standard of care for treatment of this patient's diabetes with A1C of 13%    Assessment & Plan:   Principal Problem:   Uncontrolled type 2 diabetes mellitus with hyperosmolar nonketotic hyperglycemia (HCC) Active Problems:   Homelessness   Hypertensive urgency   Essential hypertension   Uncontrolled type 2 DM with  hyperosmolar nonketotic hyperglycemia (HCC)   Normocytic anemia   Evaluation by psychiatric service required   Hyperosmolar state Resolved. Patient required insulin drip and blood sugar improved. Initially managed with Lantus.  70/30 on 11/13.  Blood sugar uncontrolled currently, especially fasting morning CBGs. Psychiatry agrees that patient does not have capacity to manage his diabetes. Diabetes management complicated by patient's non-adherence to a carb modified diet. Fasting blood sugar high this morning. Hesitant to continue increasing insulin since this is secondary to non-adherence with diet -Continue 70/30 25 units q breakfast and 30 units q dinner -Continue SSI -Continued re-education on proper diet choices by myself and nursing staff  Type 2 diabetes mellitus Last A1C of 13% on 11/07/16. Patient needs to be on insulin.  -continue management as above  Hypertension Not well controlled -increase to lisinopril 40 mg daily and continue hydrochlorothiazide   Anemia Stable. Outpatient follow-up.  Mild cognitive impairment MMSE performed on 11/17 and patient scored 20/30. Patient with a 10th grade education level. MMSE interpretation suggest mild cognitive impairment with increased odds of dementia. Results abnormal for his level of high school education. This problem is complicating his ability to manage his diabetes adequately and safely. This coincides with psychiatry's assessment of patient's lack of capacity to manage his disease. MOCA performed by OT on 12/04/16 and patient scored  8/30. Social work has consulted [facility].   DVT prophylaxis: Lovenox Code Status: Full code Family Communication: None at bedside Disposition Plan: Discharge when medically stable and has safe discharge plan   Consultants:   None  Procedures:   None  Antimicrobials:  None   Subjective: Drinking milk. No nausea or vomiting.  Objective: Vitals:   12/04/16 1633 12/04/16 2100 12/05/16 0500  12/05/16 1009  BP: (!) 160/56 (!) 153/53 (!) 198/100 Marland Kitchen(!)  148/73  Pulse: 60 62 61   Resp: 16 16 16    Temp: 98.2 F (36.8 C) 98 F (36.7 C) 98 F (36.7 C)   TempSrc: Oral Oral Oral   SpO2: 98% 98% 100%   Weight:      Height:        Intake/Output Summary (Last 24 hours) at 12/05/2016 1106 Last data filed at 12/05/2016 0747 Gross per 24 hour  Intake 1030 ml  Output 0 ml  Net 1030 ml   Filed Weights   11/26/16 0050 11/27/16 1647  Weight: 77.1 kg (170 lb) 74.5 kg (164 lb 3.9 oz)    Examination:  General exam: Appears calm and comfortable. Respiratory system: Unlabored work of breathing. Central nervous system: Alert and oriented. No focal neurological deficits. Psychiatry:  Mood & affect depressed and flat.     Data Reviewed: I have personally reviewed following labs and imaging studies  CBC: Recent Labs  Lab 12/02/16 0352  WBC 5.9  HGB 8.9*  HCT 28.5*  MCV 81.2  PLT 286   Basic Metabolic Panel: Recent Labs  Lab 11/29/16 0544 12/02/16 0352 12/03/16 0406 12/03/16 1034  NA 135 135  --   --   K 4.0 4.3  --   --   CL 101 99*  --   --   CO2 26 28  --   --   GLUCOSE 156* 234*  --  455*  BUN 14 19  --   --   CREATININE 0.67 0.97 0.99  --   CALCIUM 8.9 9.0  --   --    GFR: Estimated Creatinine Clearance: 88.8 mL/min (by C-G formula based on SCr of 0.99 mg/dL).  CBG: Recent Labs  Lab 12/04/16 0753 12/04/16 1207 12/04/16 1719 12/04/16 2127 12/05/16 0733  GLUCAP 216* 185* 248* 155* 247*    Recent Results (from the past 240 hour(s))  MRSA PCR Screening     Status: None   Collection Time: 11/26/16  5:06 AM  Result Value Ref Range Status   MRSA by PCR NEGATIVE NEGATIVE Final    Comment:        The GeneXpert MRSA Assay (FDA approved for NASAL specimens only), is one component of a comprehensive MRSA colonization surveillance program. It is not intended to diagnose MRSA infection nor to guide or monitor treatment for MRSA infections.           Radiology Studies: No results found.      Scheduled Meds: . aspirin EC  81 mg Oral Daily  . atorvastatin  40 mg Oral q1800  . enoxaparin (LOVENOX) injection  40 mg Subcutaneous Daily  . ferrous sulfate  325 mg Oral Q breakfast  . hydrochlorothiazide  25 mg Oral Daily  . insulin aspart  0-15 Units Subcutaneous TID WC  . insulin aspart protamine- aspart  25 Units Subcutaneous Q breakfast   And  . insulin aspart protamine- aspart  30 Units Subcutaneous Q supper  . lisinopril  40 mg Oral Daily  . multivitamin  1 tablet Oral Daily  . protein supplement shake  11 oz Oral BID BM   Continuous Infusions:   LOS: 9 days     Jacquelin Hawkingalph Yuriko Portales, MD [facility] 12/05/2016, 11:06 AM Pager: [phone]  If 7PM-7AM, please contact night-coverage [email or web address] Password [email or web address] 12/05/2016, 11:06 AM

## 2016-12-05 NOTE — Social Work (Addendum)
CSW received call back from Mike [name] at [facility] and CSW completed referral to [facility].  CSW will continue to follow.  9:30am-CSW received a return call from [name]. [name],nurse [phone]) that is the primary care provider for patient. She indicated that they have tried numerous interventions with patient to manage insulin. She indicated that used to self manage insulin and has struggled to take pills.  She prefers that patient be managed via pills as he is not able to manage insulin independently anymore. She confirms that she is the clinic Monday-Friday and has some out of clinic days, but has tried to assist patient with medication management for some time and patient has not been compliant with appointments. Nurse Practitioner is available to answer any questions and provide feedback as she is the primary provider for patient.   Maybe the patient could be referred to diabetes coordinator and possibly consider other treatment options for patient.  CSW will continue to follow.  Mike BreathPatricia Ahliya Glatt, [name] Clinical Social Worker [phone]

## 2016-12-06 LAB — GLUCOSE, CAPILLARY
GLUCOSE-CAPILLARY: 495 mg/dL — AB (ref 65–99)
GLUCOSE-CAPILLARY: 142 mg/dL — AB (ref 65–99)
GLUCOSE-CAPILLARY: 204 mg/dL — AB (ref 65–99)
Glucose-Capillary: 121 mg/dL — ABNORMAL HIGH (ref 65–99)
Glucose-Capillary: 195 mg/dL — ABNORMAL HIGH (ref 65–99)

## 2016-12-06 LAB — BASIC METABOLIC PANEL
Anion gap: 8 (ref 5–15)
BUN: 20 mg/dL (ref 6–20)
CALCIUM: 8.9 mg/dL (ref 8.9–10.3)
CO2: 26 mmol/L (ref 22–32)
CREATININE: 0.83 mg/dL (ref 0.61–1.24)
Chloride: 99 mmol/L — ABNORMAL LOW (ref 101–111)
GFR calc non Af Amer: >60 mL/min (ref >60)
Glucose, Bld: 475 mg/dL — ABNORMAL HIGH (ref 65–99)
Potassium: 4.6 mmol/L (ref 3.5–5.1)
SODIUM: 133 mmol/L — AB (ref 135–145)

## 2016-12-06 LAB — CBC
HCT: 27.9 % — ABNORMAL LOW (ref 39.0–52.0)
Hemoglobin: 8.8 g/dL — ABNORMAL LOW (ref 13.0–17.0)
MCH: 26.1 pg (ref 26.0–34.0)
MCHC: 31.5 g/dL (ref 30.0–36.0)
MCV: 82.8 fL (ref 78.0–100.0)
PLATELETS: 294 10*3/uL (ref 150–400)
RBC: 3.37 MIL/uL — AB (ref 4.22–5.81)
RDW: 14.4 % (ref 11.5–15.5)
WBC: 5 10*3/uL (ref 4.0–10.5)

## 2016-12-06 MED ORDER — INSULIN ASPART 100 UNIT/ML ~~LOC~~ SOLN
0.0000 [IU] | Freq: Three times a day (TID) | SUBCUTANEOUS | Status: DC
  Administered 2016-12-06 (×2): 3 [IU] via SUBCUTANEOUS
  Administered 2016-12-06: 20 [IU] via SUBCUTANEOUS
  Administered 2016-12-07 – 2016-12-08 (×2): 15 [IU] via SUBCUTANEOUS
  Administered 2016-12-08: 11 [IU] via SUBCUTANEOUS
  Administered 2016-12-09: 7 [IU] via SUBCUTANEOUS
  Administered 2016-12-09: 3 [IU] via SUBCUTANEOUS
  Administered 2016-12-10: 11 [IU] via SUBCUTANEOUS
  Administered 2016-12-10 (×2): 7 [IU] via SUBCUTANEOUS
  Administered 2016-12-11 (×2): 4 [IU] via SUBCUTANEOUS
  Administered 2016-12-11: 7 [IU] via SUBCUTANEOUS
  Administered 2016-12-12: 4 [IU] via SUBCUTANEOUS

## 2016-12-06 MED ORDER — INSULIN ASPART 100 UNIT/ML ~~LOC~~ SOLN
0.0000 [IU] | Freq: Every day | SUBCUTANEOUS | Status: DC
  Administered 2016-12-06: 2 [IU] via SUBCUTANEOUS
  Administered 2016-12-07: 3 [IU] via SUBCUTANEOUS

## 2016-12-06 NOTE — Progress Notes (Signed)
  PROGRESS NOTE  Mike [name] [id number] DOB: August 31, 1960 DOA: 11/26/2016 PCP: [name], [name], NP  Brief Narrative: 56yom PMH DM, homelessness 23 ED visits in 6 months, 8 admissions in 6 months, presented with malaise, found to have hyperglycemia, SBP 200s, disheveled. Admitted for hyperosmolar hyperglycemic state, treated with IV insulin and transitioned to subq insulin. Hyperglycemia has been recurrent secondary to non-compliance with diet.   Assessment/Plan DM type 2 uncontrolled with Hgb A1c 13%, with hyperosmolor hyperglycemia on admission secondary to noncompliance. Seen by psychiatry "does not have capacity to manage treatment of diabetes" - labile, complicated by lack of insight and non-compliance with diet, (today was picking food out of trash and was found to have food he was not given) - documentation from psych, Dr. Caleb PoppNettey, and PCP (via CM) notable for concurrence that patient is unable to manage DM. Insulin is best treatment but only an option in a controlled setting. I would not expect oral meds to control CBG adequately, even with perfect medication compliance, given obvious ongoing dietary noncompliance and resistance to education efforts. - CM and CSW continue to explore options - without a controlled setting, it is not expected that patient could maintain reasonably controlled CBG as outpatient  Normocytic anemia, stable   Essential HTN, s/p hypertensive urgency on admission - continue lisinopril. Stop HCTZ given hyperglycemia.  Noncompliance  Homelessness  DVT prophylaxis: enoxaparin Code Status: full Family Communication: none Disposition Plan: OT recommended SNF    Mike Sacksaniel Nicanor Mendolia, MD  [facility] Direct contact: [phone] --Via amion app OR  --[email or web address]; password [email or web address]  7PM-7AM contact night coverage as above 12/06/2016, 7:02 PM  LOS: 10 days   Consultants:  Psychiatry   Procedures:    Antimicrobials:    Interval  history/Subjective: Feels ok.  Objective: Vitals:  Vitals:   12/06/16 1012 12/06/16 1347  BP: (!) 143/77 (!) 150/79  Pulse:  64  Resp:  18  Temp:  98.2 F (36.8 C)  SpO2:  100%    Exam:  Constitutional:  . Appears calm and comfortable lying in bed ENMT:  . grossly normal hearing  Respiratory:  . CTA bilaterally, no w/r/r.  . Respiratory effort normal. Cardiovascular:  . RRR, no m/r/g . No LE extremity edema    I have personally reviewed the following:   Labs:  CBG 495 this AM  BMP unremarkable  Hgb stable 8.8  Scheduled Meds: . aspirin EC  81 mg Oral Daily  . atorvastatin  40 mg Oral q1800  . enoxaparin (LOVENOX) injection  40 mg Subcutaneous Daily  . ferrous sulfate  325 mg Oral Q breakfast  . insulin aspart  0-20 Units Subcutaneous TID WC  . insulin aspart  0-5 Units Subcutaneous QHS  . insulin aspart protamine- aspart  25 Units Subcutaneous Q breakfast   And  . insulin aspart protamine- aspart  30 Units Subcutaneous Q supper  . lisinopril  40 mg Oral Daily  . multivitamin  1 tablet Oral Daily  . protein supplement shake  11 oz Oral BID BM   Continuous Infusions:  Principal Problem:   Uncontrolled type 2 diabetes mellitus with hyperosmolar nonketotic hyperglycemia (HCC) Active Problems:   Homelessness   Essential hypertension   Normocytic anemia   Evaluation by psychiatric service required   LOS: 10 days

## 2016-12-06 NOTE — Progress Notes (Addendum)
Inpatient Diabetes Program Recommendations  AACE/ADA: New Consensus Statement on Inpatient Glycemic Control (2015)  Target Ranges:  Prepandial:   less than 140 mg/dL      Peak postprandial:   less than 180 mg/dL (1-2 hours)      Critically ill patients:  140 - 180 mg/dL   Results for Tobin ChadGENIUS, Burns (MRN [id number]) as of 12/06/2016 09:00  Ref. Range 12/05/2016 07:33 12/05/2016 12:05 12/05/2016 17:11 12/05/2016 21:59  Glucose-Capillary Latest Ref Range: 65 - 99 mg/dL 782247 (H)  5 units Novolog +  25 units 70/30 Insulin 183 (H)  3 units Novolog  382 (H)  15 units Novolog +  30 units 70/30 Insulin 389 (H)   Results for Tobin ChadGENIUS, Farley (MRN [id number]) as of 12/06/2016 09:00  Ref. Range 12/06/2016 07:29  Glucose-Capillary Latest Ref Range: 65 - 99 mg/dL 578495 (H)  20 units Novolog +  25 units 70/30 Insulin    Home DM Meds: Novolog mix 70/30 25 units bid Glyburide 5 mg BID   Current Insulin Orders: 70/30 Insulin- 25 units AM/ 30 units PM      Novolog Resistant Correction Scale/ SSI (0-20 units) TID AC + HS      Diabetes note dated 11/07/16 states; Marland Kitchen...it may be unreasonable to send patient out on insulin since he has homeless issues and may not have the means to safely and properly store his insulin. Lantus and Novolog will be way too expensive out of pocket. Has received Rxs for 70/30 insulin before, however, patient does not take insulin consistently either and has no way to store if appropriately.Marland Kitchen.Marland Kitchen.Marland Kitchen.Would probably be best to not discharge on insulin.  Despite access to resources, shelters, assistance on orange card, disability and Medicaid information, [facility] and [facility], patient denied money, medication and access to supplies when he was admitted this visit.    For best results, patient would need to be placed in a facility where medications could be safely and routinely given.  Regardless with what medications we discharge  him on, if he goes back into the street he will return with hyperglycemia.       MD- Note Novolog SSI increased to Resistant scale this AM.  Please also consider increasing 70/30 insulin to:  30 units AM/ 35 units PM  Not sure what to recommend for outpatient glucose control?  Patient needs insulin but will likely not take it or oral DM meds either.      --Will follow patient during hospitalization--  Ambrose FinlandJeannine Johnston Adaya Garmany RN, MSN, CDE Diabetes Coordinator Inpatient Glycemic Control Team Team Pager: [phone] (8a-5p)

## 2016-12-06 NOTE — Social Work (Addendum)
CSW discussed case with [facility] Case Manager, Myrtis SerAdrian Carmichael, [phone]. She is requesting a copy of psych evaluation and H&P to be sent for review. CSW faxed clinical documents today.  CSW will continue to follow up and await outcome of [facility] involvement. [facility] Case Manager will be out of office and return on 11/27. She will f/u with CSW at that time.  Keene BreathPatricia Jovante Hammitt, [name] Clinical Social Worker [phone]

## 2016-12-07 LAB — GLUCOSE, CAPILLARY
GLUCOSE-CAPILLARY: 323 mg/dL — AB (ref 65–99)
GLUCOSE-CAPILLARY: 116 mg/dL — AB (ref 65–99)
GLUCOSE-CAPILLARY: 300 mg/dL — AB (ref 65–99)
Glucose-Capillary: 111 mg/dL — ABNORMAL HIGH (ref 65–99)

## 2016-12-07 NOTE — Progress Notes (Signed)
  PROGRESS NOTE  Mike [name] [id number] DOB: 1960-02-06 DOA: 11/26/2016 PCP: Mike SharpsPlacey, [name], NP  Brief Narrative: 56yom PMH DM, homelessness 23 ED visits in 6 months, 8 admissions in 6 months, presented with malaise, found to have hyperglycemia, SBP 200s, disheveled. Admitted for hyperosmolar hyperglycemic state, treated with IV insulin and transitioned to subq insulin. Hyperglycemia has been recurrent secondary to non-compliance with diet.   Assessment/Plan DM type 2 uncontrolled with Hgb A1c 13%, with hyperosmolor hyperglycemia on admission secondary to noncompliance. Seen by psychiatry "does not have capacity to manage treatment of diabetes" - CBG remains labile.  - continue current dosing of insulin. - await CSW/CM input on disposition. Will continue insulin if controlled setting obtained. Without controlled setting, would anticipate frequent readmission.  Normocytic anemia -stable  Essential HTN, s/p hypertensive urgency on admission - stable, continue lisinopril 11/22.    Noncompliance  Homelessness  DVT prophylaxis: enoxaparin Code Status: full Family Communication: none Disposition Plan: OT recommended SNF    Mike Sacksaniel Kodi Steil, MD  [facility] Direct contact: [phone] --Via amion app OR  --[email or web address]; password [email or web address]  7PM-7AM contact night coverage as above 12/07/2016, 1:56 PM  LOS: 11 days   Consultants:  Psychiatry   Procedures:    Antimicrobials:    Interval history/Subjective: Wants sugar in his coffee. Feels ok.  Objective: Vitals:  Vitals:   12/06/16 1347 12/06/16 2019  BP: (!) 150/79 (!) 156/63  Pulse: 64 73  Resp: 18 18  Temp: 98.2 F (36.8 C) 98.3 F (36.8 C)  SpO2: 100% 100%    Exam: Constitutional:   . Appears calm and comfortable. Ambulating in hallway without apparent difficulty. Respiratory:  . CTA bilaterally, no w/r/r.  . Respiratory effort normal. Cardiovascular:  . RRR, no m/r/g Psychiatric:   . Mental status o Mood, affect appropriate  I have personally reviewed the following:   Labs:  CBG labile, 121-495, fasting 323.  Scheduled Meds: . aspirin EC  81 mg Oral Daily  . atorvastatin  40 mg Oral q1800  . enoxaparin (LOVENOX) injection  40 mg Subcutaneous Daily  . ferrous sulfate  325 mg Oral Q breakfast  . insulin aspart  0-20 Units Subcutaneous TID WC  . insulin aspart  0-5 Units Subcutaneous QHS  . insulin aspart protamine- aspart  25 Units Subcutaneous Q breakfast   And  . insulin aspart protamine- aspart  30 Units Subcutaneous Q supper  . lisinopril  40 mg Oral Daily  . multivitamin  1 tablet Oral Daily  . protein supplement shake  11 oz Oral BID BM   Continuous Infusions:  Principal Problem:   Uncontrolled type 2 diabetes mellitus with hyperosmolar nonketotic hyperglycemia (HCC) Active Problems:   Homelessness   Essential hypertension   Normocytic anemia   Evaluation by psychiatric service required   LOS: 11 days

## 2016-12-08 LAB — GLUCOSE, CAPILLARY
GLUCOSE-CAPILLARY: 210 mg/dL — AB (ref 65–99)
Glucose-Capillary: 266 mg/dL — ABNORMAL HIGH (ref 65–99)
Glucose-Capillary: 324 mg/dL — ABNORMAL HIGH (ref 65–99)
Glucose-Capillary: 83 mg/dL (ref 65–99)
Glucose-Capillary: 62 mg/dL — ABNORMAL LOW (ref 65–99)
Glucose-Capillary: 113 mg/dL — ABNORMAL HIGH (ref 65–99)

## 2016-12-08 MED ORDER — INSULIN ASPART PROT & ASPART (70-30 MIX) 100 UNIT/ML ~~LOC~~ SUSP
30.0000 [IU] | Freq: Every day | SUBCUTANEOUS | Status: DC
  Administered 2016-12-09: 30 [IU] via SUBCUTANEOUS

## 2016-12-08 MED ORDER — INSULIN ASPART PROT & ASPART (70-30 MIX) 100 UNIT/ML ~~LOC~~ SUSP
35.0000 [IU] | Freq: Every day | SUBCUTANEOUS | Status: DC
  Administered 2016-12-08 – 2016-12-09 (×2): 35 [IU] via SUBCUTANEOUS

## 2016-12-08 NOTE — Progress Notes (Signed)
   Pt seen on 12/04/16 and discharged from acute OT services with further OT needs deferred to SNF. If further acute OT needs arise please place new OT order.  Raynald KempKathryn Maksym Pfiffner OTR/L Pager: [phone]

## 2016-12-08 NOTE — Progress Notes (Signed)
  PROGRESS NOTE  Mike [name] [id number] DOB: 11-18-1960 DOA: 11/26/2016 PCP: [name], [name], NP  Brief Narrative: 56yom PMH DM, homelessness 23 ED visits in 6 months, 8 admissions in 6 months, presented with malaise, found to have hyperglycemia, SBP 200s, disheveled. Admitted for hyperosmolar hyperglycemic state, treated with IV insulin and transitioned to subq insulin. Hyperglycemia has been recurrent secondary to non-compliance with diet.   Assessment/Plan DM type 2 uncontrolled with Hgb A1c 13%, with hyperosmolor hyperglycemia on admission secondary to noncompliance. Seen by psychiatry "does not have capacity to manage treatment of diabetes" - CBG 111-324. Will increase insulin 70/30 AM and PM dosing - await CSW/CM input on disposition. Will continue insulin if controlled setting obtained. Without controlled setting, would anticipate frequent readmission.  Normocytic anemia -stable  Essential HTN, s/p hypertensive urgency on admission - stable, continue lisinopril 11/23    Noncompliance  Homelessness  DVT prophylaxis: enoxaparin Code Status: full Family Communication: none Disposition Plan: OT recommended SNF    Brendia Sacksaniel Levia Waltermire, MD  [facility] Direct contact: [phone] --Via amion app OR  --[email or web address]; password [email or web address]  7PM-7AM contact night coverage as above 12/08/2016, 12:45 PM  LOS: 12 days   Consultants:  Psychiatry   Procedures:    Antimicrobials:    Interval history/Subjective: Feels ok today, no complaints.  Objective: Vitals:  Vitals:   12/07/16 2218 12/08/16 0623  BP: (!) 166/69 (!) 164/88  Pulse: 67 62  Resp: 18 17  Temp: 98.4 F (36.9 C) 98.7 F (37.1 C)  SpO2: 100% 100%    Exam:  Constitutional:   . Appears calm and comfortable lying in bed Respiratory:  . CTA bilaterally, no w/r/r.  . Respiratory effort normal.  Cardiovascular:  . RRR, no m/r/g . No LE extremity edema   Psychiatric:  . Mental  status o Odd affect  I have personally reviewed the following:   Labs:  CBG very labile, 111-324. Fasting 266.  Scheduled Meds: . aspirin EC  81 mg Oral Daily  . atorvastatin  40 mg Oral q1800  . enoxaparin (LOVENOX) injection  40 mg Subcutaneous Daily  . ferrous sulfate  325 mg Oral Q breakfast  . insulin aspart  0-20 Units Subcutaneous TID WC  . insulin aspart  0-5 Units Subcutaneous QHS  . insulin aspart protamine- aspart  25 Units Subcutaneous Q breakfast   And  . insulin aspart protamine- aspart  30 Units Subcutaneous Q supper  . lisinopril  40 mg Oral Daily  . multivitamin  1 tablet Oral Daily  . protein supplement shake  11 oz Oral BID BM   Continuous Infusions:  Principal Problem:   Uncontrolled type 2 diabetes mellitus with hyperosmolar nonketotic hyperglycemia (HCC) Active Problems:   Homelessness   Essential hypertension   Normocytic anemia   Evaluation by psychiatric service required   LOS: 12 days

## 2016-12-09 LAB — GLUCOSE, CAPILLARY
GLUCOSE-CAPILLARY: 97 mg/dL (ref 65–99)
GLUCOSE-CAPILLARY: 212 mg/dL — AB (ref 65–99)
GLUCOSE-CAPILLARY: 146 mg/dL — AB (ref 65–99)
Glucose-Capillary: 42 mg/dL — CL (ref 65–99)
Glucose-Capillary: 166 mg/dL — ABNORMAL HIGH (ref 65–99)

## 2016-12-09 NOTE — Progress Notes (Signed)
Hypoglycemic Event  CBG: 42  Treatment: 15 GM carbohydrate snack  Symptoms: None  Follow-up CBG: Time:2157 CBG Result:166  Possible Reasons for Event: Unknown  Comments/MD notified:    Rolland PorterJosephine M Julliette Frentz

## 2016-12-09 NOTE — Progress Notes (Signed)
  PROGRESS NOTE  Mike [name] [id number] DOB: [birth year] DOA: 11/26/2016 PCP: Mike [name], Mike Ann, Mike [name]  Brief Narrative: 56yom PMH DM, homelessness 23 ED visits in 6 months, 8 admissions in 6 months, presented with malaise, found to have hyperglycemia, SBP 200s, disheveled. Admitted for hyperosmolar hyperglycemic state, treated with IV insulin and transitioned to subq insulin. Hyperglycemia has been recurrent secondary to non-compliance with diet.   Assessment/Plan DM type 2 uncontrolled with Hgb A1c 13%, with hyperosmolor hyperglycemia on admission secondary to noncompliance. Seen by psychiatry "does not have capacity to manage treatment of diabetes" - CBG 62-212. No change to insulin dosing today. - await CSW/CM input on disposition. Will continue insulin if controlled setting obtained. Without controlled setting, would anticipate frequent readmission.  Normocytic anemia - has been stable  Essential HTN, s/p hypertensive urgency on admission - stable, continue lisinopril 11/24  Noncompliance  Homelessness  DVT prophylaxis: enoxaparin Code Status: full Family Communication: none Disposition Plan: OT recommended SNF    Brendia Sacksaniel Haylynn Pha, MD  [facility] Direct contact: [phone] --Via amion app OR  --[email or web address]; password [email or web address]  7PM-7AM contact night coverage as above 12/09/2016, 3:32 PM  LOS: 13 days   Consultants:  Psychiatry   Procedures:    Antimicrobials:    Interval history/Subjective: No complaints.  Objective: Vitals:  Vitals:   12/09/16 0531 12/09/16 1337  BP: 140/65 (!) 154/71  Pulse: 65 75  Resp:    Temp: 98.6 F (37 C) 98.8 F (37.1 C)  SpO2: 100% 100%    Exam:  Constitutional:   . Appears calm and comfortable, standing in room Respiratory:  . CTA bilaterally, no w/r/r.  . Respiratory effort normal.  Cardiovascular:  . RRR, no m/r/g Psychiatric:  . Mental status o Affect odd  I have personally reviewed the  following:   Labs:  CBG very labile, 62-212. Fasting 97  Scheduled Meds: . aspirin EC  81 mg Oral Daily  . atorvastatin  40 mg Oral q1800  . enoxaparin (LOVENOX) injection  40 mg Subcutaneous Daily  . ferrous sulfate  325 mg Oral Q breakfast  . insulin aspart  0-20 Units Subcutaneous TID WC  . insulin aspart  0-5 Units Subcutaneous QHS  . insulin aspart protamine- aspart  30 Units Subcutaneous Q breakfast   And  . insulin aspart protamine- aspart  35 Units Subcutaneous Q supper  . lisinopril  40 mg Oral Daily  . multivitamin  1 tablet Oral Daily  . protein supplement shake  11 oz Oral BID BM   Continuous Infusions:  Principal Problem:   Uncontrolled type 2 diabetes mellitus with hyperosmolar nonketotic hyperglycemia (HCC) Active Problems:   Homelessness   Essential hypertension   Normocytic anemia   Evaluation by psychiatric service required   LOS: 13 days

## 2016-12-09 NOTE — Progress Notes (Signed)
Hypoglycemic Event  CBG: 62  Treatment: 15 GM carbohydrate snack  Symptoms: None  Follow-up CBG: Time:2302 CBG Result:113  Possible Reasons for Event: Other: uncontrolled diabetes  Comments/MD notified:    Claude Mangesross, Evlyn Amason S

## 2016-12-10 LAB — GLUCOSE, CAPILLARY
GLUCOSE-CAPILLARY: 238 mg/dL — AB (ref 65–99)
GLUCOSE-CAPILLARY: 189 mg/dL — AB (ref 65–99)
GLUCOSE-CAPILLARY: 96 mg/dL (ref 65–99)
Glucose-Capillary: 293 mg/dL — ABNORMAL HIGH (ref 65–99)

## 2016-12-10 LAB — CREATININE, SERUM
Creatinine, Ser: 0.87 mg/dL (ref 0.61–1.24)
GFR calc Af Amer: >60 mL/min (ref >60)
GFR calc non Af Amer: >60 mL/min (ref >60)

## 2016-12-10 MED ORDER — CHLORTHALIDONE 25 MG PO TABS
25.0000 mg | ORAL_TABLET | Freq: Every day | ORAL | Status: DC
  Administered 2016-12-11 – 2016-12-22 (×12): 25 mg via ORAL
  Filled 2016-12-10 (×12): qty 1

## 2016-12-10 MED ORDER — INSULIN ASPART PROT & ASPART (70-30 MIX) 100 UNIT/ML ~~LOC~~ SUSP
35.0000 [IU] | Freq: Every day | SUBCUTANEOUS | Status: DC
  Administered 2016-12-10 – 2016-12-11 (×2): 35 [IU] via SUBCUTANEOUS

## 2016-12-10 MED ORDER — CHLORTHALIDONE 25 MG PO TABS: 25.0000 mg | ORAL_TABLET | Freq: Every day | ORAL | Status: DC

## 2016-12-10 MED ORDER — INSULIN ASPART PROT & ASPART (70-30 MIX) 100 UNIT/ML ~~LOC~~ SUSP
25.0000 [IU] | Freq: Every day | SUBCUTANEOUS | Status: DC
  Administered 2016-12-11 – 2016-12-12 (×2): 25 [IU] via SUBCUTANEOUS

## 2016-12-10 NOTE — Progress Notes (Addendum)
  PROGRESS NOTE  Mike [name] XBJ:[id number] DOB: 1960-11-09 DOA: 11/26/2016 PCP: [name], [name], NP  Brief Narrative: 56yom PMH DM, homelessness 23 ED visits in 6 months, 8 admissions in 6 months, presented with malaise, found to have hyperglycemia, SBP 200s, disheveled. Admitted for hyperosmolar hyperglycemic state, treated with IV insulin and transitioned to subq insulin. Hyperglycemia has been recurrent secondary to non-compliance with diet. Seen by psychiatry and felt to not have capacity for self-care of DM. Patient has had 23 ED visits and 8 hospitalizations in 6 months. CM/CSW exploring guardianship. Patient would be best served with controlled setting long term for care of DM.  Assessment/Plan DM type 2 uncontrolled with Hgb A1c 13%, with hyperosmolor hyperglycemia on admission secondary to noncompliance. Seen by psychiatry "does not have capacity to manage treatment of diabetes" - CBG remains labile with low again in evening. Will decrease AM dose. - await CSW/CM input on disposition. Will continue insulin if controlled setting obtained. Without controlled setting, would anticipate frequent readmission and management with oral meds.  Normocytic anemia - has been stable  Essential HTN, s/p hypertensive urgency on admission - fair control, will add chlorthalidone. Continue lisinopril 11/25  Noncompliance  Homelessness  DVT prophylaxis: enoxaparin Code Status: full Family Communication: none Disposition Plan: OT recommended SNF    Brendia Sacksaniel Daniil Labarge, MD  [facility] Direct contact: [phone] --Via amion app OR  --[email or web address]; password [email or web address]  7PM-7AM contact night coverage as above 12/10/2016, 2:58 PM  LOS: 14 days   Consultants:  Psychiatry   Procedures:    Antimicrobials:    Interval history/Subjective: Feels ok.  Objective: Vitals:  Vitals:   12/10/16 0519 12/10/16 1100  BP: (!) 166/78 (!) 168/79  Pulse: (!) 59 (!) 59  Resp: 16 16    Temp: 98.4 F (36.9 C) 98.3 F (36.8 C)  SpO2: 100% 100%    Exam:  Constitutional:   . Appears calm and comfortable Respiratory:  . CTA bilaterally, no w/r/r.  . Respiratory effort normal. Cardiovascular:  . RRR, no m/r/g Psychiatric:  . Mental status o Mood, affect appropriate  I have personally reviewed the following:   Labs:  CBG labile, 42-238. Fasting 238  Scheduled Meds: . aspirin EC  81 mg Oral Daily  . atorvastatin  40 mg Oral q1800  . enoxaparin (LOVENOX) injection  40 mg Subcutaneous Daily  . ferrous sulfate  325 mg Oral Q breakfast  . insulin aspart  0-20 Units Subcutaneous TID WC  . insulin aspart  0-5 Units Subcutaneous QHS  . [START ON 12/11/2016] insulin aspart protamine- aspart  25 Units Subcutaneous Q breakfast   And  . insulin aspart protamine- aspart  35 Units Subcutaneous Q supper  . lisinopril  40 mg Oral Daily  . multivitamin  1 tablet Oral Daily  . protein supplement shake  11 oz Oral BID BM   Continuous Infusions:  Principal Problem:   Uncontrolled type 2 diabetes mellitus with hyperosmolar nonketotic hyperglycemia (HCC) Active Problems:   Homelessness   Essential hypertension   Normocytic anemia   Evaluation by psychiatric service required   LOS: 14 days

## 2016-12-11 LAB — CBC
HCT: 28.8 % — ABNORMAL LOW (ref 39.0–52.0)
HEMOGLOBIN: 9 g/dL — AB (ref 13.0–17.0)
MCH: 26.1 pg (ref 26.0–34.0)
MCHC: 31.3 g/dL (ref 30.0–36.0)
MCV: 83.5 fL (ref 78.0–100.0)
Platelets: 358 10*3/uL (ref 150–400)
RBC: 3.45 MIL/uL — AB (ref 4.22–5.81)
RDW: 14.2 % (ref 11.5–15.5)
WBC: 6.3 10*3/uL (ref 4.0–10.5)

## 2016-12-11 LAB — BASIC METABOLIC PANEL
ANION GAP: 6 (ref 5–15)
BUN: 20 mg/dL (ref 6–20)
CHLORIDE: 102 mmol/L (ref 101–111)
CO2: 26 mmol/L (ref 22–32)
CREATININE: 0.79 mg/dL (ref 0.61–1.24)
Calcium: 9.3 mg/dL (ref 8.9–10.3)
GFR calc non Af Amer: >60 mL/min (ref >60)
Glucose, Bld: 196 mg/dL — ABNORMAL HIGH (ref 65–99)
POTASSIUM: 4.3 mmol/L (ref 3.5–5.1)
SODIUM: 134 mmol/L — AB (ref 135–145)

## 2016-12-11 LAB — GLUCOSE, CAPILLARY
GLUCOSE-CAPILLARY: 186 mg/dL — AB (ref 65–99)
GLUCOSE-CAPILLARY: 225 mg/dL — AB (ref 65–99)
Glucose-Capillary: 168 mg/dL — ABNORMAL HIGH (ref 65–99)
Glucose-Capillary: 99 mg/dL (ref 65–99)

## 2016-12-11 NOTE — Progress Notes (Addendum)
  PROGRESS NOTE  Mike [name] ZOX:[id number] DOB: Nov 23, 1960 DOA: 11/26/2016 PCP: [name], [name], NP  Brief Narrative: 56yom PMH DM, homelessness 23 ED visits in 6 months, 8 admissions in 6 months, presented with malaise, found to have hyperglycemia, SBP 200s, disheveled. Admitted for hyperosmolar hyperglycemic state, treated with IV insulin and transitioned to subq insulin. Hyperglycemia has been recurrent secondary to non-compliance with diet. Seen by psychiatry and felt to not have capacity for self-care of DM. Patient has had 23 ED visits and 8 hospitalizations in 6 months. CM/CSW exploring guardianship. Patient would be best served with controlled setting long term for care of DM.  No acute events reported overnight. Patient was seen and examined at his bedside. He has no complaints at this time. Denies nausea, abd pain, polyuria. [place]. None in [place].  Assessment/Plan DM type 2 uncontrolled  -Hgb A1c 13% -hyperosmolor hyperglycemia on admission secondary to noncompliance. -per psychiatry "does not have capacity to manage treatment of diabetes" -await CSW/CM input on disposition.  -novolog 70/30 mix 35 U qhs and 25 U breakfast -ISS with hypoglycemia protocol  Normocytic anemia - has been stable - Hg 9.0, mcv 83 - No obvious sign of bleeding - ferrous sulfate  HLD -atorvastatin  Essential HTN - stable - hypertensive urgency on admission - lisinopril, chlorthalidone  Noncompliance most likely 2/2 to financial constraint/homelessness - case management/social worker for placement  DVT prophylaxis: enoxaparin sq 40 mg daily Code Status: full Family Communication: none Disposition Plan: OT recommended SNF    12/11/2016, 9:47 AM  LOS: 15 days   Consultants:  Psychiatry   Procedures:  None  Antimicrobials:  None   Vitals:  Vitals:   12/10/16 2144 12/11/16 0554  BP: (!) 160/64 (!) 143/51  Pulse: 71 (!) 55  Resp: 16 16    Temp: 97.7 F (36.5 C) 98.5 F (36.9 C)  SpO2: 100% 100%    Exam:  Constitutional:   . Appears calm and comfortable Respiratory:  . CTA bilaterally, no w/r/r.  . Respiratory effort normal. Cardiovascular:  . RRR, no m/r/g Psychiatric:  . Mental status o Mood, affect appropriate   Scheduled Meds: . aspirin EC  81 mg Oral Daily  . atorvastatin  40 mg Oral q1800  . chlorthalidone  25 mg Oral Daily  . enoxaparin (LOVENOX) injection  40 mg Subcutaneous Daily  . ferrous sulfate  325 mg Oral Q breakfast  . insulin aspart  0-20 Units Subcutaneous TID WC  . insulin aspart  0-5 Units Subcutaneous QHS  . insulin aspart protamine- aspart  25 Units Subcutaneous Q breakfast   And  . insulin aspart protamine- aspart  35 Units Subcutaneous Q supper  . lisinopril  40 mg Oral Daily  . multivitamin  1 tablet Oral Daily  . protein supplement shake  11 oz Oral BID BM   Continuous Infusions:  Principal Problem:   Uncontrolled type 2 diabetes mellitus with hyperosmolar nonketotic hyperglycemia (HCC) Active Problems:   Homelessness   Essential hypertension   Normocytic anemia   Evaluation by psychiatric service required   LOS: 15 days

## 2016-12-11 NOTE — Progress Notes (Signed)
Inpatient Diabetes Program Recommendations  AACE/ADA: New Consensus Statement on Inpatient Glycemic Control (2015)  Target Ranges:  Prepandial:   less than 140 mg/dL      Peak postprandial:   less than 180 mg/dL (1-2 hours)      Critically ill patients:  140 - 180 mg/dL   Lab Results  Component Value Date   GLUCAP 186 (H) 12/11/2016   HGBA1C 13.0 (H) 11/07/2016    Review of Glycemic ControlResults for Tobin ChadGENIUS, Javiel (MRN [id number]) as of 12/11/2016 09:51  Ref. Range 12/10/2016 07:50 12/10/2016 12:40 12/10/2016 17:50 12/10/2016 21:35 12/11/2016 07:49  Glucose-Capillary Latest Ref Range: 65 - 99 mg/dL 409238 (H) 811189 (H) 914293 (H) 96 186 (H)  Home DM Meds:        Novolog mix 70/30 25 units bid        Glyburide 5 mg BID  Current Insulin Orders: 70/30 Insulin- 25 units AM/ 35 units PM                                       Novolog Resistant Correction Scale/ SSI (0-20 units) TID AC + HS  Inpatient Diabetes Program Recommendations:   Please reduce 70/30 to 28 units q PM.  Also consider reducing Novolog to sensitive tid with meals and HS.   Thanks,  Beryl MeagerJenny Salayah Meares, RN, BC-ADM Inpatient Diabetes Coordinator Pager [phone] (8a-5p)

## 2016-12-12 LAB — GLUCOSE, CAPILLARY
GLUCOSE-CAPILLARY: 181 mg/dL — AB (ref 65–99)
GLUCOSE-CAPILLARY: 176 mg/dL — AB (ref 65–99)
Glucose-Capillary: 119 mg/dL — ABNORMAL HIGH (ref 65–99)
Glucose-Capillary: 365 mg/dL — ABNORMAL HIGH (ref 65–99)

## 2016-12-12 LAB — BASIC METABOLIC PANEL
Anion gap: 8 (ref 5–15)
BUN: 22 mg/dL — AB (ref 6–20)
CHLORIDE: 101 mmol/L (ref 101–111)
CO2: 28 mmol/L (ref 22–32)
CREATININE: 0.81 mg/dL (ref 0.61–1.24)
Calcium: 9.3 mg/dL (ref 8.9–10.3)
GFR calc non Af Amer: >60 mL/min (ref >60)
GLUCOSE: 167 mg/dL — AB (ref 65–99)
Potassium: 4.3 mmol/L (ref 3.5–5.1)
Sodium: 137 mmol/L (ref 135–145)

## 2016-12-12 LAB — CBC
HCT: 28.1 % — ABNORMAL LOW (ref 39.0–52.0)
Hemoglobin: 8.9 g/dL — ABNORMAL LOW (ref 13.0–17.0)
MCH: 26.3 pg (ref 26.0–34.0)
MCHC: 31.7 g/dL (ref 30.0–36.0)
MCV: 82.9 fL (ref 78.0–100.0)
PLATELETS: 357 10*3/uL (ref 150–400)
RBC: 3.39 MIL/uL — AB (ref 4.22–5.81)
RDW: 14 % (ref 11.5–15.5)
WBC: 5.1 10*3/uL (ref 4.0–10.5)

## 2016-12-12 MED ORDER — INSULIN ASPART PROT & ASPART (70-30 MIX) 100 UNIT/ML ~~LOC~~ SUSP
25.0000 [IU] | Freq: Every day | SUBCUTANEOUS | Status: DC
  Administered 2016-12-13 – 2016-12-15 (×3): 25 [IU] via SUBCUTANEOUS

## 2016-12-12 MED ORDER — INSULIN ASPART 100 UNIT/ML ~~LOC~~ SOLN: 0.0000 [IU] | Freq: Every day | SUBCUTANEOUS | Status: DC

## 2016-12-12 MED ORDER — INSULIN ASPART 100 UNIT/ML ~~LOC~~ SOLN
0.0000 [IU] | Freq: Three times a day (TID) | SUBCUTANEOUS | Status: DC
  Administered 2016-12-12: 9 [IU] via SUBCUTANEOUS
  Administered 2016-12-13: 5 [IU] via SUBCUTANEOUS
  Administered 2016-12-13: 3 [IU] via SUBCUTANEOUS
  Administered 2016-12-13: 2 [IU] via SUBCUTANEOUS
  Administered 2016-12-14 (×2): 7 [IU] via SUBCUTANEOUS

## 2016-12-12 MED ORDER — INSULIN ASPART PROT & ASPART (70-30 MIX) 100 UNIT/ML ~~LOC~~ SUSP
28.0000 [IU] | Freq: Every day | SUBCUTANEOUS | Status: DC
  Administered 2016-12-12 – 2016-12-14 (×3): 28 [IU] via SUBCUTANEOUS

## 2016-12-12 NOTE — Progress Notes (Signed)
PROGRESS NOTE    Mike [name]  ZOX:[id number] DOB: 03-Sep-1960 DOA: 11/26/2016 PCP: [name], [name], NP   Brief Narrative:  The patient is a 56yom PMH DM, homelessness 23 ED visits in 6 months, 8 admissions in 6 months, presented with malaise, found to have hyperglycemia, SBP 200s, disheveled. Admitted for hyperosmolar hyperglycemic state, treated with IV insulin and transitioned to subq insulin. Hyperglycemia has been recurrent secondary to non-compliance with diet. Seen by psychiatry and felt to not have capacity for self-care of DM. Patient has had 23 ED visits and 8 hospitalizations in 6 months. CM/CSW exploring guardianship. Patient would be best served with controlled setting long term for care of DM and pending APS Case Review.  Assessment & Plan:   Principal Problem:   Uncontrolled type 2 diabetes mellitus with hyperosmolar nonketotic hyperglycemia (HCC) Active Problems:   Homelessness   Essential hypertension   Normocytic anemia   Evaluation by psychiatric service required  DM type 2 uncontrolled with intermittent Hypoglycemia -Hgb A1c 13% -Hyperosmolor hyperglycemia on admission secondary to noncompliance. -Per psychiatry "does not have capacity to manage treatment of diabetes" -Await CSW/CM input on disposition.  -Novolog 70/30 mix 35 U qhs and 25 U breakfast changed to 28 units qHS per Diabetic Education Coordinator   -Resistant Novolog SSI AC/HS changed to Sensitive Scale with hypoglycemia protocol -CBG's ranging from 99-365  Normocytic Anemia -Has been stable; Hb/Hct went from 9.0/28.8 -> 8.9/28.1 -No obvious sign of bleeding; Continue to Monitor for S/Sx of Bleeding -C/w Ferrous sulfate 325 mg po Daily   HLD -C/w Atorvastatin 40 mg po qHS  Essential HTN -Stable -Hypertensive urgency on admission -C/w Lisinopril 40 mg po Daily, Chlorthalidone 25 mg po Daily -C/w Hydralazine 10 mg IV q4hprn for SBP>170 or DBP>90  Noncompliance most likely 2/2 to  financial constraint/homelessness -Case management/social worker for placement -Per Social Worker Note CSW discussed with APS Case Manager and currently awaiting for follow up and awaiting APS involvement.   DVT prophylaxis: Lovenox 40 mg sq Daily Code Status: FULL CODE Family Communication: No family present at bedside Disposition Plan: Possible Group Home; Pending APS Case Review   Consultants:   Social Worker/Case Management    Procedures: None   Antimicrobials: Anti-infectives (From admission, onward)   None     Subjective: Seen and examined at bedside and wanted a diet coke. No complaints at this time. No CP or SOB or any nausea or vomiting.   Objective: Vitals:   12/11/16 1300 12/11/16 2127 12/12/16 0452 12/12/16 1353  BP: (!) 144/72 (!) 156/66 (!) 154/69 (!) 176/78  Pulse: 65 72 (!) 57 60  Resp: 20 20 20 18   Temp: 98.7 F (37.1 C) 99 F (37.2 C) 98.3 F (36.8 C) 98.2 F (36.8 C)  TempSrc: Oral Oral Oral Oral  SpO2: 100% 99% 100% 100%  Weight:      Height:        Intake/Output Summary (Last 24 hours) at 12/12/2016 1726 Last data filed at 12/12/2016 1700 Gross per 24 hour  Intake 480 ml  Output 602 ml  Net -122 ml   Filed Weights   11/26/16 0050 11/27/16 1647  Weight: 77.1 kg (170 lb) 74.5 kg (164 lb 3.9 oz)    Examination: Physical Exam:  Constitutional: Disheveled Appearing [name] in NAD and appears calm and comfortable Eyes: Lids and conjunctivae normal, sclerae anicteric  ENMT: External Ears, Nose appear normal. Grossly normal hearing. Mucous membranes are moist.  Neck: Appears normal, supple, no cervical masses, normal ROM,  no appreciable thyromegaly, no JVD Respiratory: Clear to auscultation bilaterally, no wheezing, rales, rhonchi or crackles. Normal respiratory effort and patient is not tachypenic. No accessory muscle use.  Cardiovascular: RRR, no murmurs / rubs / gallops. S1 and S2 auscultated. No extremity edema.   Abdomen: Soft, non-tender,  non-distended. No masses palpated. No appreciable hepatosplenomegaly. Bowel sounds positive x4.  GU: Deferred. Musculoskeletal: No clubbing / cyanosis of digits/nails. No joint deformity upper and lower extremities.  Skin: No rashes, lesions, ulcers. No induration; Warm and dry.  Neurologic: CN 2-12 grossly intact with no focal deficits. Romberg sign and cerebellar reflexes not assessed.  Psychiatric: Normal judgment and insight. Alert and oriented x 3. Normal mood and appropriate affect.   Data Reviewed: I have personally reviewed following labs and imaging studies  CBC: Recent Labs  Lab 12/06/16 0748 12/11/16 1229 12/12/16 0537  WBC 5.0 6.3 5.1  HGB 8.8* 9.0* 8.9*  HCT 27.9* 28.8* 28.1*  MCV 82.8 83.5 82.9  PLT 294 358 357   Basic Metabolic Panel: Recent Labs  Lab 12/06/16 0748 12/10/16 0710 12/11/16 1229 12/12/16 0537  NA 133*  --  134* 137  K 4.6  --  4.3 4.3  CL 99*  --  102 101  CO2 26  --  26 28  GLUCOSE 475*  --  196* 167*  BUN 20  --  20 22*  CREATININE 0.83 0.87 0.79 0.81  CALCIUM 8.9  --  9.3 9.3   GFR: Estimated Creatinine Clearance: 108.6 mL/min (by C-G formula based on SCr of 0.81 mg/dL). Liver Function Tests: No results for input(s): AST, ALT, ALKPHOS, BILITOT, PROT, ALBUMIN in the last 168 hours. No results for input(s): LIPASE, AMYLASE in the last 168 hours. No results for input(s): AMMONIA in the last 168 hours. Coagulation Profile: No results for input(s): INR, PROTIME in the last 168 hours. Cardiac Enzymes: No results for input(s): CKTOTAL, CKMB, CKMBINDEX, TROPONINI in the last 168 hours. BNP (last 3 results) No results for input(s): PROBNP in the last 8760 hours. HbA1C: No results for input(s): HGBA1C in the last 72 hours. CBG: Recent Labs  Lab 12/11/16 1644 12/11/16 2125 12/12/16 0751 12/12/16 1237 12/12/16 1707  GLUCAP 168* 99 181* 119* 365*   Lipid Profile: No results for input(s): CHOL, HDL, LDLCALC, TRIG, CHOLHDL, LDLDIRECT in  the last 72 hours. Thyroid Function Tests: No results for input(s): TSH, T4TOTAL, FREET4, T3FREE, THYROIDAB in the last 72 hours. Anemia Panel: No results for input(s): VITAMINB12, FOLATE, FERRITIN, TIBC, IRON, RETICCTPCT in the last 72 hours. Sepsis Labs: No results for input(s): PROCALCITON, LATICACIDVEN in the last 168 hours.  No results found for this or any previous visit (from the past 240 hour(s)).   Radiology Studies: No results found.  Scheduled Meds: . aspirin EC  81 mg Oral Daily  . atorvastatin  40 mg Oral q1800  . chlorthalidone  25 mg Oral Daily  . enoxaparin (LOVENOX) injection  40 mg Subcutaneous Daily  . ferrous sulfate  325 mg Oral Q breakfast  . insulin aspart  0-5 Units Subcutaneous QHS  . insulin aspart  0-9 Units Subcutaneous TID WC  . insulin aspart protamine- aspart  28 Units Subcutaneous Q supper   And  . [START ON 12/13/2016] insulin aspart protamine- aspart  25 Units Subcutaneous Q breakfast  . lisinopril  40 mg Oral Daily  . multivitamin  1 tablet Oral Daily  . protein supplement shake  11 oz Oral BID BM   Continuous Infusions:  LOS: 16 days   Merlene Laughtermair Latif Sheikh, DO [facility] Pager [phone]  If 7PM-7AM, please contact night-coverage [email or web address] Password [email or web address] 12/12/2016, 5:26 PM

## 2016-12-13 DIAGNOSIS — E43 Unspecified severe protein-calorie malnutrition: Secondary | ICD-10-CM

## 2016-12-13 LAB — COMPREHENSIVE METABOLIC PANEL
ALT: 49 U/L (ref 17–63)
AST: 28 U/L (ref 15–41)
Albumin: 3.4 g/dL — ABNORMAL LOW (ref 3.5–5.0)
Alkaline Phosphatase: 83 U/L (ref 38–126)
Anion gap: 6 (ref 5–15)
BUN: 21 mg/dL — ABNORMAL HIGH (ref 6–20)
CHLORIDE: 105 mmol/L (ref 101–111)
CO2: 29 mmol/L (ref 22–32)
CREATININE: 0.84 mg/dL (ref 0.61–1.24)
Calcium: 9.5 mg/dL (ref 8.9–10.3)
GFR calc non Af Amer: >60 mL/min (ref >60)
Glucose, Bld: 161 mg/dL — ABNORMAL HIGH (ref 65–99)
POTASSIUM: 4.3 mmol/L (ref 3.5–5.1)
Sodium: 140 mmol/L (ref 135–145)
Total Bilirubin: 0.4 mg/dL (ref 0.3–1.2)
Total Protein: 6.6 g/dL (ref 6.5–8.1)

## 2016-12-13 LAB — CBC WITH DIFFERENTIAL/PLATELET
BASOS ABS: 0 10*3/uL (ref 0.0–0.1)
Basophils Relative: 1 %
Eosinophils Absolute: 0.1 10*3/uL (ref 0.0–0.7)
Eosinophils Relative: 3 %
HEMATOCRIT: 28.7 % — AB (ref 39.0–52.0)
HEMOGLOBIN: 9 g/dL — AB (ref 13.0–17.0)
LYMPHS ABS: 2 10*3/uL (ref 0.7–4.0)
LYMPHS PCT: 37 %
MCH: 26.2 pg (ref 26.0–34.0)
MCHC: 31.4 g/dL (ref 30.0–36.0)
MCV: 83.4 fL (ref 78.0–100.0)
Monocytes Absolute: 0.7 10*3/uL (ref 0.1–1.0)
Monocytes Relative: 13 %
NEUTROS ABS: 2.6 10*3/uL (ref 1.7–7.7)
NEUTROS PCT: 48 %
PLATELETS: 331 10*3/uL (ref 150–400)
RBC: 3.44 MIL/uL — AB (ref 4.22–5.81)
RDW: 14.3 % (ref 11.5–15.5)
WBC: 5.4 10*3/uL (ref 4.0–10.5)

## 2016-12-13 LAB — PHOSPHORUS: PHOSPHORUS: 4.7 mg/dL — AB (ref 2.5–4.6)

## 2016-12-13 LAB — GLUCOSE, CAPILLARY
GLUCOSE-CAPILLARY: 262 mg/dL — AB (ref 65–99)
GLUCOSE-CAPILLARY: 145 mg/dL — AB (ref 65–99)
Glucose-Capillary: 187 mg/dL — ABNORMAL HIGH (ref 65–99)
Glucose-Capillary: 209 mg/dL — ABNORMAL HIGH (ref 65–99)

## 2016-12-13 LAB — MAGNESIUM: Magnesium: 2 mg/dL (ref 1.7–2.4)

## 2016-12-13 NOTE — Progress Notes (Signed)
PROGRESS NOTE    Mike [name]  ZOX:[id number] DOB: Nov 27, 1960 DOA: 11/26/2016 PCP: [name], Mike Ann, NP   Brief Narrative:  The patient is a 56yom PMH DM, homelessness 23 ED visits in 6 months, 8 admissions in 6 months, presented with malaise, found to have hyperglycemia, SBP 200s, disheveled. Admitted for hyperosmolar hyperglycemic state, treated with IV insulin and transitioned to subq insulin. Hyperglycemia has been recurrent secondary to non-compliance with diet. Seen by psychiatry and felt to not have capacity for self-care of DM. Patient has had 23 ED visits and 8 hospitalizations in 6 months. CM/CSW exploring guardianship. Patient would be best served with controlled setting long term for care of DM and pending APS Case Review. Social worker called APS Case manager this AM and unable to reach and will try to call back again for update.   Assessment & Plan:   Principal Problem:   Uncontrolled type 2 diabetes mellitus with hyperosmolar nonketotic hyperglycemia (HCC) Active Problems:   Homelessness   Protein-calorie malnutrition, severe   Essential hypertension   Normocytic anemia   HLD (hyperlipidemia)   Evaluation by psychiatric service required   Hyperphosphatemia  DM type 2 uncontrolled with intermittent Hypoglycemia -Hgb A1c 13% -Hyperosmolor hyperglycemia on admission secondary to noncompliance. -Per psychiatry "does not have capacity to manage treatment of diabetes" -Await CSW/CM input on disposition.  -C/w Novolog 70/30 mix 28U qhs and 25 U breakfast -Resistant Novolog SSI AC/HS changed to Sensitive Scale with hypoglycemia protocol -CBG's ranging from 176-262  Normocytic Anemia -Has been stable; Hb/Hct went from 9.0/28.8 -> 8.9/28.1 -> 9.0/28.7 -No obvious sign of bleeding; Continue to Monitor for S/Sx of Bleeding -C/w Ferrous sulfate 325 mg po Daily  -Repeat CBC in AM   HLD -C/w Atorvastatin 40 mg po qHS  Essential HTN -Stable -Hypertensive urgency on  admission -C/w Lisinopril 40 mg po Daily, Chlorthalidone 25 mg po Daily -C/w Hydralazine 10 mg IV q4hprn for SBP>170 or DBP>90  Hyperphosphatemia -Mild. Patient's Phos Level was 4.7 -Need to watch as patient is on Protein Supplements -Continue to Monitor and Repeat in AM   Severe Protein Calorie Malnutrition -In the Context of Social or Environmental Circumstances -Nutritionist consulted last month -Albumin was 3.4 -C/w MVI 1 tab po Daily -C/w Protein Supplement Liquid 11 oz po BID between meals  Noncompliance most likely 2/2 to Financial Constraint/Homelessness -Case management/Social worker for placement -Per Social Worker Note CSW discussed with APS Case Manager and currently awaiting for follow up and awaiting APS involvement. **Discussed with CSW and awaiting update from APS Case Manager and return phone call/update on investigation.   DVT prophylaxis: Lovenox 40 mg sq Daily Code Status: FULL CODE Family Communication: No family present at bedside Disposition Plan: Possible Group Home; Pending APS Case Review   Consultants:   Social Worker/Case Management    Procedures: None   Antimicrobials: Anti-infectives (From admission, onward)   None     Subjective: Seen and examined and he was laying in chair looking out the window. No complaints or concerns. No CP or SOB.  Objective: Vitals:   12/12/16 1353 12/12/16 2026 12/13/16 0436 12/13/16 1328  BP: (!) 176/78 (!) 159/63 132/70 (!) 145/88  Pulse: 60 67 71 65  Resp: 18 18 18 18   Temp: 98.2 F (36.8 C) 98.6 F (37 C) 98.6 F (37 C) 98.4 F (36.9 C)  TempSrc: Oral Oral Oral Oral  SpO2: 100% 100% 100% 100%  Weight:      Height:  Intake/Output Summary (Last 24 hours) at 12/13/2016 1505 Last data filed at 12/13/2016 0900 Gross per 24 hour  Intake 1200 ml  Output 0 ml  Net 1200 ml   Filed Weights   11/26/16 0050 11/27/16 1647  Weight: 77.1 kg (170 lb) 74.5 kg (164 lb 3.9 oz)     Examination: Physical Exam:  Constitutional: Disheveled appearing [name] in NAD. Appears calm and comfortable Eyes: Sclerae anicteric. Lids normal ENMT: External Ears and nose appear normal. MMM Neck: Supple with no JVD Respiratory: CTAB; No appreciable wheezing/rales/rhonchi Cardiovascular: RRR; S1 S2; No appreciable LE Edema Abdomen: Soft, NT, ND. Bowel Sounds present GU: Deferred Musculoskeletal: No contractures; No cyanosis Skin:  Warm and dry. No appreciable rashes or lesions on a limited skin eval Neurologic: CN 2-12 grossly intact. No appreciable focal deficits Psychiatric: Normal mood and affect. Awake and alert  Data Reviewed: I have personally reviewed following labs and imaging studies  CBC: Recent Labs  Lab 12/11/16 1229 12/12/16 0537 12/13/16 0521  WBC 6.3 5.1 5.4  NEUTROABS  --   --  2.6  HGB 9.0* 8.9* 9.0*  HCT 28.8* 28.1* 28.7*  MCV 83.5 82.9 83.4  PLT 358 357 331   Basic Metabolic Panel: Recent Labs  Lab 12/10/16 0710 12/11/16 1229 12/12/16 0537 12/13/16 0521  NA  --  134* 137 140  K  --  4.3 4.3 4.3  CL  --  102 101 105  CO2  --  26 28 29   GLUCOSE  --  196* 167* 161*  BUN  --  20 22* 21*  CREATININE 0.87 0.79 0.81 0.84  CALCIUM  --  9.3 9.3 9.5  MG  --   --   --  2.0  PHOS  --   --   --  4.7*   GFR: Estimated Creatinine Clearance: 104.7 mL/min (by C-G formula based on SCr of 0.84 mg/dL). Liver Function Tests: Recent Labs  Lab 12/13/16 0521  AST 28  ALT 49  ALKPHOS 83  BILITOT 0.4  PROT 6.6  ALBUMIN 3.4*   No results for input(s): LIPASE, AMYLASE in the last 168 hours. No results for input(s): AMMONIA in the last 168 hours. Coagulation Profile: No results for input(s): INR, PROTIME in the last 168 hours. Cardiac Enzymes: No results for input(s): CKTOTAL, CKMB, CKMBINDEX, TROPONINI in the last 168 hours. BNP (last 3 results) No results for input(s): PROBNP in the last 8760 hours. HbA1C: No results for input(s): HGBA1C in the  last 72 hours. CBG: Recent Labs  Lab 12/12/16 1237 12/12/16 1707 12/12/16 2025 12/13/16 0758 12/13/16 1149  GLUCAP 119* 365* 176* 187* 262*   Lipid Profile: No results for input(s): CHOL, HDL, LDLCALC, TRIG, CHOLHDL, LDLDIRECT in the last 72 hours. Thyroid Function Tests: No results for input(s): TSH, T4TOTAL, FREET4, T3FREE, THYROIDAB in the last 72 hours. Anemia Panel: No results for input(s): VITAMINB12, FOLATE, FERRITIN, TIBC, IRON, RETICCTPCT in the last 72 hours. Sepsis Labs: No results for input(s): PROCALCITON, LATICACIDVEN in the last 168 hours.  No results found for this or any previous visit (from the past 240 hour(s)).   Radiology Studies: No results found.  Scheduled Meds: . aspirin EC  81 mg Oral Daily  . atorvastatin  40 mg Oral q1800  . chlorthalidone  25 mg Oral Daily  . enoxaparin (LOVENOX) injection  40 mg Subcutaneous Daily  . ferrous sulfate  325 mg Oral Q breakfast  . insulin aspart  0-5 Units Subcutaneous QHS  . insulin aspart  0-9 Units Subcutaneous TID WC  . insulin aspart protamine- aspart  28 Units Subcutaneous Q supper   And  . insulin aspart protamine- aspart  25 Units Subcutaneous Q breakfast  . lisinopril  40 mg Oral Daily  . multivitamin  1 tablet Oral Daily  . protein supplement shake  11 oz Oral BID BM   Continuous Infusions:   LOS: 17 days   Merlene Laughtermair Latif Sheikh, DO [facility] Pager [phone]  If 7PM-7AM, please contact night-coverage [email or web address] Password [email or web address] 12/13/2016, 3:05 PM

## 2016-12-13 NOTE — Social Work (Addendum)
CSW discussed case with doctor this morning.  CSW called APS case manager Cristy Friedlander and left message at [phone].   CSW discussed case with Clinical Director and CSW  will call back again to see if another staff can assist or the update on investigation or obtain copy of report with outcome.  2:22pm CSW called APS case worker again and left another message. The case worker does not have an alternate staff to cover in her absence.  CSW then called back APS line and left another general message requesting a call back to knw the outcome of an investigation that was reported on 11/21.  4:39pm CSW called APS case worker again and left another voicemail.  4:45pm Case worker called CSW back to discuss. She indicated that she met with patient last week in the hospital and his male friend was visiting.She is still investigating case and has not made determination yet. She confirmed that she received psych eval addressing pt's capacity to manage diabetes. CSW asked her about self neglect or guardianship or what else they can do for patient in this case. Case worker indicated that patient would need some sort of income for group home or family care home. Pt has capacity to make decisions and she will f/u to see what else they can do for patient in this case.  CSW will continue to follow up.  Elissa Hefty, [name] Clinical Social Worker [phone]  .

## 2016-12-14 LAB — CBC WITH DIFFERENTIAL/PLATELET
BASOS PCT: 1 %
Basophils Absolute: 0 10*3/uL (ref 0.0–0.1)
EOS ABS: 0.2 10*3/uL (ref 0.0–0.7)
EOS PCT: 3 %
HCT: 31.6 % — ABNORMAL LOW (ref 39.0–52.0)
HEMOGLOBIN: 10 g/dL — AB (ref 13.0–17.0)
LYMPHS ABS: 2.7 10*3/uL (ref 0.7–4.0)
Lymphocytes Relative: 44 %
MCH: 26.1 pg (ref 26.0–34.0)
MCHC: 31.6 g/dL (ref 30.0–36.0)
MCV: 82.5 fL (ref 78.0–100.0)
MONO ABS: 0.6 10*3/uL (ref 0.1–1.0)
MONOS PCT: 9 %
NEUTROS PCT: 43 %
Neutro Abs: 2.7 10*3/uL (ref 1.7–7.7)
Platelets: 385 10*3/uL (ref 150–400)
RBC: 3.83 MIL/uL — ABNORMAL LOW (ref 4.22–5.81)
RDW: 13.8 % (ref 11.5–15.5)
WBC: 6.2 10*3/uL (ref 4.0–10.5)

## 2016-12-14 LAB — COMPREHENSIVE METABOLIC PANEL
ALBUMIN: 4 g/dL (ref 3.5–5.0)
ALK PHOS: 95 U/L (ref 38–126)
ALT: 55 U/L (ref 17–63)
ANION GAP: 7 (ref 5–15)
AST: 35 U/L (ref 15–41)
BUN: 24 mg/dL — ABNORMAL HIGH (ref 6–20)
CALCIUM: 9.7 mg/dL (ref 8.9–10.3)
CO2: 28 mmol/L (ref 22–32)
Chloride: 101 mmol/L (ref 101–111)
Creatinine, Ser: 0.95 mg/dL (ref 0.61–1.24)
GFR calc non Af Amer: >60 mL/min (ref >60)
Glucose, Bld: 263 mg/dL — ABNORMAL HIGH (ref 65–99)
POTASSIUM: 4.1 mmol/L (ref 3.5–5.1)
SODIUM: 136 mmol/L (ref 135–145)
TOTAL PROTEIN: 7.4 g/dL (ref 6.5–8.1)
Total Bilirubin: 0.7 mg/dL (ref 0.3–1.2)

## 2016-12-14 LAB — PHOSPHORUS: PHOSPHORUS: 4 mg/dL (ref 2.5–4.6)

## 2016-12-14 LAB — MAGNESIUM: Magnesium: 2.1 mg/dL (ref 1.7–2.4)

## 2016-12-14 LAB — GLUCOSE, CAPILLARY
GLUCOSE-CAPILLARY: 307 mg/dL — AB (ref 65–99)
GLUCOSE-CAPILLARY: 311 mg/dL — AB (ref 65–99)
GLUCOSE-CAPILLARY: 129 mg/dL — AB (ref 65–99)
Glucose-Capillary: 144 mg/dL — ABNORMAL HIGH (ref 65–99)

## 2016-12-14 MED ORDER — INSULIN ASPART 100 UNIT/ML ~~LOC~~ SOLN
0.0000 [IU] | Freq: Three times a day (TID) | SUBCUTANEOUS | Status: DC
  Administered 2016-12-14: 2 [IU] via SUBCUTANEOUS
  Administered 2016-12-15: 8 [IU] via SUBCUTANEOUS
  Administered 2016-12-15: 3 [IU] via SUBCUTANEOUS
  Administered 2016-12-15: 15 [IU] via SUBCUTANEOUS
  Administered 2016-12-16 (×2): 3 [IU] via SUBCUTANEOUS
  Administered 2016-12-16 – 2016-12-17 (×2): 8 [IU] via SUBCUTANEOUS
  Administered 2016-12-17 (×2): 3 [IU] via SUBCUTANEOUS
  Administered 2016-12-18 (×2): 8 [IU] via SUBCUTANEOUS

## 2016-12-14 NOTE — Progress Notes (Signed)
PROGRESS NOTE    Mike [name]  ZOX:[id number] DOB: September 02, 1960 DOA: 11/26/2016 PCP: Mike SharpsPlacey, [name], NP   Brief Narrative:  The patient is a 56yom PMH DM, homelessness 23 ED visits in 6 months, 8 admissions in 6 months, presented with malaise, found to have hyperglycemia, SBP 200s, disheveled. Admitted for hyperosmolar hyperglycemic state, treated with IV insulin and transitioned to subq insulin. Hyperglycemia has been recurrent secondary to non-compliance with diet. Seen by psychiatry and felt to not have capacity for self-care of DM. Patient has had 23 ED visits and 8 hospitalizations in 6 months. CM/CSW exploring guardianship. Patient would be best served with controlled setting long term for care of DM and pending APS Case Review. Per CSW APS is still reviewing Case.   Assessment & Plan:   Principal Problem:   Uncontrolled type 2 diabetes mellitus with hyperosmolar nonketotic hyperglycemia (HCC) Active Problems:   Homelessness   Protein-calorie malnutrition, severe   Essential hypertension   Normocytic anemia   HLD (hyperlipidemia)   Evaluation by psychiatric service required   Hyperphosphatemia  DM type 2 uncontrolled with intermittent Hypoglycemia -Hgb A1c 13% -Unsafe to Discharge home on po Medications  -Hyperosmolor hyperglycemia on admission secondary to noncompliance. -Per psychiatry "does not have capacity to manage treatment of diabetes" -Await CSW/CM input on disposition.  -C/w Novolog 70/30 mix 28U qhs and 25 U breakfast -Sensitive Novolog SSI AC/HS changed to Resistant Scale with hypoglycemia protocol -CBG's ranging from 145-311  Normocytic Anemia -Has been stable; Hb/Hct went from 9.0/28.8 -> 8.9/28.1 -> 9.0/28.7 -> 10.0/31.6 -No obvious sign of bleeding; Continue to Monitor for S/Sx of Bleeding -C/w Ferrous sulfate 325 mg po Daily  -Repeat CBC in AM   HLD -C/w Atorvastatin 40 mg po qHS  Essential HTN -Stable -Hypertensive urgency on admission -C/w  Lisinopril 40 mg po Daily, Chlorthalidone 25 mg po Daily -C/w Hydralazine 10 mg IV q4hprn for SBP>170 or DBP>90  Hyperphosphatemia, improved -Mild. Patient's Phos Level was 4.7 and now 4.0 -Need to watch as patient is on Protein Supplements -Continue to Monitor and Repeat in AM   Severe Protein Calorie Malnutrition -In the Context of Social or Environmental Circumstances -Nutritionist consulted last month -Albumin was 3.4 -C/w MVI 1 tab po Daily -C/w Protein Supplement Liquid 11 oz po BID between meals  Noncompliance most likely 2/2 to Financial Constraint/Homelessness -Case management/Social worker for placement -Per Social Worker Note CSW discussed with APS Case Manager and currently awaiting for follow up and awaiting APS involvement. **Discussed with CSW and awaiting update from APS Case Manager and return phone call/update on investigation. Per CSW Case is still being reviewed by APS and APS Case Manager contacting patient's PCP for further information.   DVT prophylaxis: Lovenox 40 mg sq Daily Code Status: FULL CODE Family Communication: No family present at bedside Disposition Plan: Possible Group Home; Pending APS Case Review   Consultants:   Social Worker/Case Management    Procedures: None   Antimicrobials: Anti-infectives (From admission, onward)   None     Subjective: Seen and examined and had no complaints. No CP or SOB. Denied any other complaints at this time.   Objective: Vitals:   12/13/16 1328 12/13/16 2125 12/14/16 0515 12/14/16 1407  BP: (!) 145/88 132/63 (!) 151/78 (!) 159/68  Pulse: 65 62 70 71  Resp: 18 18 18 18   Temp: 98.4 F (36.9 C) 98.4 F (36.9 C) 98.7 F (37.1 C) 97.6 F (36.4 C)  TempSrc: Oral Oral Oral Oral  SpO2: 100% 100%  100% 100%  Weight:      Height:        Intake/Output Summary (Last 24 hours) at 12/14/2016 1524 Last data filed at 12/14/2016 1300 Gross per 24 hour  Intake 1754 ml  Output 800 ml  Net 954 ml   Filed  Weights   11/26/16 0050 11/27/16 1647  Weight: 77.1 kg (170 lb) 74.5 kg (164 lb 3.9 oz)    Examination: Physical Exam:  Constitutional: Disheveled appearing [name] in NAD laying on couch bedside.  Eyes: Sclerae anicteric. Lids normal ENMT: External Ears and nose appear normal. MMM Neck: Supple with no JVD Respiratory: CTAB; Unlabored breathing Cardiovascular: RRR; S1, S2. No appreciable LE edema Abdomen: Soft, NT, ND. Bowel sounds present GU: Deferred Musculoskeletal: No contractures. No cyanosis Skin: Warm and dry. No appreciable rashes or lesions on a limited skin eval Neurologic: CN 2-12 grossly intact. No appreciable focal deficits Psychiatric: Normal appearing mood and affect. Awake and alert  Data Reviewed: I have personally reviewed following labs and imaging studies  CBC: Recent Labs  Lab 12/11/16 1229 12/12/16 0537 12/13/16 0521 12/14/16 0502  WBC 6.3 5.1 5.4 6.2  NEUTROABS  --   --  2.6 2.7  HGB 9.0* 8.9* 9.0* 10.0*  HCT 28.8* 28.1* 28.7* 31.6*  MCV 83.5 82.9 83.4 82.5  PLT 358 357 331 385   Basic Metabolic Panel: Recent Labs  Lab 12/10/16 0710 12/11/16 1229 12/12/16 0537 12/13/16 0521 12/14/16 0502  NA  --  134* 137 140 136  K  --  4.3 4.3 4.3 4.1  CL  --  102 101 105 101  CO2  --  26 28 29 28   GLUCOSE  --  196* 167* 161* 263*  BUN  --  20 22* 21* 24*  CREATININE 0.87 0.79 0.81 0.84 0.95  CALCIUM  --  9.3 9.3 9.5 9.7  MG  --   --   --  2.0 2.1  PHOS  --   --   --  4.7* 4.0   GFR: Estimated Creatinine Clearance: 92.6 mL/min (by C-G formula based on SCr of 0.95 mg/dL). Liver Function Tests: Recent Labs  Lab 12/13/16 0521 12/14/16 0502  AST 28 35  ALT 49 55  ALKPHOS 83 95  BILITOT 0.4 0.7  PROT 6.6 7.4  ALBUMIN 3.4* 4.0   No results for input(s): LIPASE, AMYLASE in the last 168 hours. No results for input(s): AMMONIA in the last 168 hours. Coagulation Profile: No results for input(s): INR, PROTIME in the last 168 hours. Cardiac  Enzymes: No results for input(s): CKTOTAL, CKMB, CKMBINDEX, TROPONINI in the last 168 hours. BNP (last 3 results) No results for input(s): PROBNP in the last 8760 hours. HbA1C: No results for input(s): HGBA1C in the last 72 hours. CBG: Recent Labs  Lab 12/13/16 1149 12/13/16 1648 12/13/16 2122 12/14/16 0819 12/14/16 1242  GLUCAP 262* 209* 145* 307* 311*   Lipid Profile: No results for input(s): CHOL, HDL, LDLCALC, TRIG, CHOLHDL, LDLDIRECT in the last 72 hours. Thyroid Function Tests: No results for input(s): TSH, T4TOTAL, FREET4, T3FREE, THYROIDAB in the last 72 hours. Anemia Panel: No results for input(s): VITAMINB12, FOLATE, FERRITIN, TIBC, IRON, RETICCTPCT in the last 72 hours. Sepsis Labs: No results for input(s): PROCALCITON, LATICACIDVEN in the last 168 hours.  No results found for this or any previous visit (from the past 240 hour(s)).   Radiology Studies: No results found.  Scheduled Meds: . aspirin EC  81 mg Oral Daily  . atorvastatin  40  mg Oral q1800  . chlorthalidone  25 mg Oral Daily  . enoxaparin (LOVENOX) injection  40 mg Subcutaneous Daily  . ferrous sulfate  325 mg Oral Q breakfast  . insulin aspart  0-5 Units Subcutaneous QHS  . insulin aspart  0-9 Units Subcutaneous TID WC  . insulin aspart protamine- aspart  28 Units Subcutaneous Q supper   And  . insulin aspart protamine- aspart  25 Units Subcutaneous Q breakfast  . lisinopril  40 mg Oral Daily  . multivitamin  1 tablet Oral Daily  . protein supplement shake  11 oz Oral BID BM   Continuous Infusions:   LOS: 18 days   Merlene Laughtermair Latif Sheikh, DO [facility] Pager [phone]  If 7PM-7AM, please contact night-coverage [email or web address] Password [email or web address] 12/14/2016, 3:24 PM

## 2016-12-15 LAB — COMPREHENSIVE METABOLIC PANEL
ALBUMIN: 3.5 g/dL (ref 3.5–5.0)
ALK PHOS: 81 U/L (ref 38–126)
ALT: 48 U/L (ref 17–63)
AST: 32 U/L (ref 15–41)
Anion gap: 7 (ref 5–15)
BILIRUBIN TOTAL: 0.5 mg/dL (ref 0.3–1.2)
BUN: 22 mg/dL — AB (ref 6–20)
CO2: 28 mmol/L (ref 22–32)
CREATININE: 0.83 mg/dL (ref 0.61–1.24)
Calcium: 9.5 mg/dL (ref 8.9–10.3)
Chloride: 102 mmol/L (ref 101–111)
GFR calc Af Amer: >60 mL/min (ref >60)
GLUCOSE: 211 mg/dL — AB (ref 65–99)
Potassium: 4.1 mmol/L (ref 3.5–5.1)
Sodium: 137 mmol/L (ref 135–145)
TOTAL PROTEIN: 6.6 g/dL (ref 6.5–8.1)

## 2016-12-15 LAB — CBC WITH DIFFERENTIAL/PLATELET
BASOS ABS: 0 10*3/uL (ref 0.0–0.1)
Basophils Relative: 1 %
Eosinophils Absolute: 0.2 10*3/uL (ref 0.0–0.7)
Eosinophils Relative: 3 %
HEMATOCRIT: 29.2 % — AB (ref 39.0–52.0)
HEMOGLOBIN: 9.2 g/dL — AB (ref 13.0–17.0)
LYMPHS PCT: 42 %
Lymphs Abs: 2.4 10*3/uL (ref 0.7–4.0)
MCH: 26 pg (ref 26.0–34.0)
MCHC: 31.5 g/dL (ref 30.0–36.0)
MCV: 82.5 fL (ref 78.0–100.0)
MONO ABS: 0.7 10*3/uL (ref 0.1–1.0)
Monocytes Relative: 12 %
NEUTROS ABS: 2.5 10*3/uL (ref 1.7–7.7)
NEUTROS PCT: 42 %
Platelets: 337 10*3/uL (ref 150–400)
RBC: 3.54 MIL/uL — AB (ref 4.22–5.81)
RDW: 13.6 % (ref 11.5–15.5)
WBC: 5.8 10*3/uL (ref 4.0–10.5)

## 2016-12-15 LAB — GLUCOSE, CAPILLARY
GLUCOSE-CAPILLARY: 281 mg/dL — AB (ref 65–99)
GLUCOSE-CAPILLARY: 193 mg/dL — AB (ref 65–99)
GLUCOSE-CAPILLARY: 159 mg/dL — AB (ref 65–99)
Glucose-Capillary: 383 mg/dL — ABNORMAL HIGH (ref 65–99)

## 2016-12-15 LAB — MAGNESIUM: Magnesium: 2 mg/dL (ref 1.7–2.4)

## 2016-12-15 LAB — PHOSPHORUS: PHOSPHORUS: 3.6 mg/dL (ref 2.5–4.6)

## 2016-12-15 MED ORDER — INSULIN ASPART PROT & ASPART (70-30 MIX) 100 UNIT/ML ~~LOC~~ SUSP
25.0000 [IU] | Freq: Every day | SUBCUTANEOUS | Status: DC
  Administered 2016-12-16 – 2016-12-21 (×6): 25 [IU] via SUBCUTANEOUS
  Filled 2016-12-15: qty 10

## 2016-12-15 MED ORDER — INSULIN ASPART PROT & ASPART (70-30 MIX) 100 UNIT/ML ~~LOC~~ SUSP
30.0000 [IU] | Freq: Every day | SUBCUTANEOUS | Status: DC
  Administered 2016-12-15 – 2016-12-20 (×6): 30 [IU] via SUBCUTANEOUS
  Filled 2016-12-15: qty 10

## 2016-12-15 NOTE — Progress Notes (Signed)
PROGRESS NOTE    Mike [name]  OZH:[id number] DOB: [birth year] DOA: 11/26/2016 PCP: [name], [name], NP   Brief Narrative:  The patient is a 56yom PMH DM, homelessness 23 ED visits in 6 months, 8 admissions in 6 months, presented with malaise, found to have hyperglycemia, SBP 200s, disheveled. Admitted for hyperosmolar hyperglycemic state, treated with IV insulin and transitioned to subq insulin. Hyperglycemia has been recurrent secondary to non-compliance with diet. Seen by psychiatry and felt to not have capacity for self-care of DM. Patient has had 23 ED visits and 8 hospitalizations in 6 months. CM/CSW exploring guardianship. Patient would be best served with controlled setting long term for care of DM and pending APS Case Review. Per CSW APS is still reviewing Case.    Assessment & Plan:   Principal Problem:   Uncontrolled type 2 diabetes mellitus with hyperosmolar nonketotic hyperglycemia (HCC) Active Problems:   Homelessness   Protein-calorie malnutrition, severe   Essential hypertension   Normocytic anemia   HLD (hyperlipidemia)   Evaluation by psychiatric service required   Hyperphosphatemia  DM type 2 uncontrolled with intermittent Hypoglycemia -Hgb A1c 13% -Unsafe to Discharge home on po Medications  -Hyperosmolor hyperglycemia on admission secondary to noncompliance. -Per psychiatry "does not have capacity to manage treatment of diabetes" -Await CSW/CM input on disposition.  -Increased Novolog 70/30 mix 28U qhs to 30U qHS and c/w 25 U breakfast at the recommendation of Diabetes Education Coordinator  -Sensitive Novolog SSI AC/HS changed to Moderate Novolog SSI AC with hypoglycemia protocol -CBG's ranging from 129-281  Normocytic Anemia -Has been stable; Hb/Hct went from 9.0/28.8 -> 8.9/28.1 -> 9.0/28.7 -> 10.0/31.6 -> 9.2/29.2 -No obvious sign of bleeding; Continue to Monitor for S/Sx of Bleeding -C/w Ferrous sulfate 325 mg po Daily  -Repeat CBC in AM    HLD -C/w Atorvastatin 40 mg po qHS  Essential HTN -Stable -Hypertensive urgency on admission -C/w Lisinopril 40 mg po Daily, Chlorthalidone 25 mg po Daily -C/w Hydralazine 10 mg IV q4hprn for SBP>170 or DBP>90  Hyperphosphatemia, improved -Mild. Patient's Phos Level was 4.7 and now 3.6 -Need to watch as patient is on Protein Supplements -Continue to Monitor and Repeat in AM   Severe Protein Calorie Malnutrition -In the Context of Social or Environmental Circumstances -Nutritionist consulted last month -Albumin was 3.5 -C/w MVI 1 tab po Daily -C/w Protein Supplement Liquid 11 oz po BID between meals  Noncompliance most likely 2/2 to Financial Constraint/Homelessness -Case management/Social worker for placement -Per Social Worker Note CSW discussed with APS Case Manager and currently awaiting for follow up and awaiting APS involvement. **Discussed with CSW and awaiting update from APS Case Manager and return phone call/update on investigation. Per CSW Case is still being reviewed by APS and APS Case Manager contacting patient's PCP for further information.   DVT prophylaxis: Lovenox 40 mg sq Daily Code Status: FULL CODE Family Communication: No family present at bedside Disposition Plan: Possible Group Home; Pending APS Case Review; Need Social Work Follow Up  Consultants:   Social Worker/Case Management    Procedures: None   Antimicrobials: Anti-infectives (From admission, onward)   None     Subjective: Seen and examined at bedside and he had no complaints. No Nausea or vomiting. No other concerns at this time.   Objective: Vitals:   12/14/16 1407 12/14/16 2105 12/15/16 0434 12/15/16 1313  BP: (!) 159/68 (!) 161/79 (!) 149/60 (!) 149/78  Pulse: 71 67 (!) 56 (!) 59  Resp: 18 19 18 19   Temp:  97.6 F (36.4 C) 98.9 F (37.2 C) 98.7 F (37.1 C) 98.3 F (36.8 C)  TempSrc: Oral Oral Oral Oral  SpO2: 100% 100% 100% 100%  Weight:      Height:         Intake/Output Summary (Last 24 hours) at 12/15/2016 1606 Last data filed at 12/14/2016 2341 Gross per 24 hour  Intake 480 ml  Output 600 ml  Net -120 ml   Filed Weights   11/26/16 0050 11/27/16 1647  Weight: 77.1 kg (170 lb) 74.5 kg (164 lb 3.9 oz)    Examination: Physical Exam:  Constitutional: Disheveled appearing [name] in NAD who appears calm Eyes: Sclerae anicteric. Lids normal ENMT: External Ears and nose appear normal. MMM Neck: Supple with no JVD Respiratory: CTAB; No appreciable wheezing/rales/rhonchi. Unlabored breathing Cardiovascular: RRR; S1, S2. No appreciable LE Edema Abdomen: Soft, NT, ND. Bowel sounds present GU: Deferred Musculoskeletal: No contractures. No cyanosis Skin: Warm and Dry. No appreciable rashes or lesions on a limited skin eval. Nails are unkepmt Neurologic: CN 2-12 grossly intact. No appreciable focal deficits Psychiatric: Normal appearing mood and affect. Impaired judgement and insight. Awake and alert  Data Reviewed: I have personally reviewed following labs and imaging studies  CBC: Recent Labs  Lab 12/11/16 1229 12/12/16 0537 12/13/16 0521 12/14/16 0502 12/15/16 0417  WBC 6.3 5.1 5.4 6.2 5.8  NEUTROABS  --   --  2.6 2.7 2.5  HGB 9.0* 8.9* 9.0* 10.0* 9.2*  HCT 28.8* 28.1* 28.7* 31.6* 29.2*  MCV 83.5 82.9 83.4 82.5 82.5  PLT 358 357 331 385 337   Basic Metabolic Panel: Recent Labs  Lab 12/11/16 1229 12/12/16 0537 12/13/16 0521 12/14/16 0502 12/15/16 0417  NA 134* 137 140 136 137  K 4.3 4.3 4.3 4.1 4.1  CL 102 101 105 101 102  CO2 26 28 29 28 28   GLUCOSE 196* 167* 161* 263* 211*  BUN 20 22* 21* 24* 22*  CREATININE 0.79 0.81 0.84 0.95 0.83  CALCIUM 9.3 9.3 9.5 9.7 9.5  MG  --   --  2.0 2.1 2.0  PHOS  --   --  4.7* 4.0 3.6   GFR: Estimated Creatinine Clearance: 106 mL/min (by C-G formula based on SCr of 0.83 mg/dL). Liver Function Tests: Recent Labs  Lab 12/13/16 0521 12/14/16 0502 12/15/16 0417  AST 28 35 32   ALT 49 55 48  ALKPHOS 83 95 81  BILITOT 0.4 0.7 0.5  PROT 6.6 7.4 6.6  ALBUMIN 3.4* 4.0 3.5   No results for input(s): LIPASE, AMYLASE in the last 168 hours. No results for input(s): AMMONIA in the last 168 hours. Coagulation Profile: No results for input(s): INR, PROTIME in the last 168 hours. Cardiac Enzymes: No results for input(s): CKTOTAL, CKMB, CKMBINDEX, TROPONINI in the last 168 hours. BNP (last 3 results) No results for input(s): PROBNP in the last 8760 hours. HbA1C: No results for input(s): HGBA1C in the last 72 hours. CBG: Recent Labs  Lab 12/14/16 1242 12/14/16 1705 12/14/16 2058 12/15/16 0757 12/15/16 1228  GLUCAP 311* 144* 129* 281* 193*   Lipid Profile: No results for input(s): CHOL, HDL, LDLCALC, TRIG, CHOLHDL, LDLDIRECT in the last 72 hours. Thyroid Function Tests: No results for input(s): TSH, T4TOTAL, FREET4, T3FREE, THYROIDAB in the last 72 hours. Anemia Panel: No results for input(s): VITAMINB12, FOLATE, FERRITIN, TIBC, IRON, RETICCTPCT in the last 72 hours. Sepsis Labs: No results for input(s): PROCALCITON, LATICACIDVEN in the last 168 hours.  No results found for this  or any previous visit (from the past 240 hour(s)).   Radiology Studies: No results found.  Scheduled Meds: . aspirin EC  81 mg Oral Daily  . atorvastatin  40 mg Oral q1800  . chlorthalidone  25 mg Oral Daily  . enoxaparin (LOVENOX) injection  40 mg Subcutaneous Daily  . ferrous sulfate  325 mg Oral Q breakfast  . insulin aspart  0-15 Units Subcutaneous TID WC  . insulin aspart protamine- aspart  30 Units Subcutaneous Q supper   And  . [START ON 12/16/2016] insulin aspart protamine- aspart  25 Units Subcutaneous Q breakfast  . lisinopril  40 mg Oral Daily  . multivitamin  1 tablet Oral Daily  . protein supplement shake  11 oz Oral BID BM   Continuous Infusions:   LOS: 19 days   Merlene Laughter, DO [facility] Pager [phone]  If 7PM-7AM, please contact  night-coverage [email or web address] Password [email or web address] 12/15/2016, 4:06 PM

## 2016-12-15 NOTE — Progress Notes (Signed)
Inpatient Diabetes Program Recommendations  AACE/ADA: New Consensus Statement on Inpatient Glycemic Control (2015)  Target Ranges:  Prepandial:   less than 140 mg/dL      Peak postprandial:   less than 180 mg/dL (1-2 hours)      Critically ill patients:  140 - 180 mg/dL   Results for Mike [name], Mike [name] (MRN [id number]) as of 12/15/2016 08:42  Ref. Range 12/14/2016 08:19 12/14/2016 12:42 12/14/2016 17:05 12/14/2016 20:58 12/15/2016 07:57  Glucose-Capillary Latest Ref Range: 65 - 99 mg/dL 147307 (H) 829311 (H) 562144 (H) 129 (H) 281 (H)   Review of Glycemic Control  Diabetes history: DM 2 Outpatient Diabetes medications: 70/30 25 units BID, Glyburide 5 mg BID Current orders for Inpatient glycemic control: 70/30 25 units qam, 28 units qpm  Inpatient Diabetes Program Recommendations:    Fasting glucose levels are still elevated. Consider increasing evening dose of 70/30 slightly to 30 units with supper.  Thanks,  Christena DeemShannon Caylor [place] RN, MSN, [facility] Inpatient Diabetes Coordinator Team Pager [phone] (8a-5p)

## 2016-12-15 NOTE — Plan of Care (Signed)
  Education: Knowledge of General Education information will improve 12/15/2016 0328 - Progressing by Oswald HillockGebru, Constancia Geeting K, RN

## 2016-12-16 LAB — CBC WITH DIFFERENTIAL/PLATELET
Basophils Absolute: 0 10*3/uL (ref 0.0–0.1)
Basophils Relative: 0 %
Eosinophils Absolute: 0.3 10*3/uL (ref 0.0–0.7)
Eosinophils Relative: 4 %
HEMATOCRIT: 30.4 % — AB (ref 39.0–52.0)
HEMOGLOBIN: 9.7 g/dL — AB (ref 13.0–17.0)
LYMPHS ABS: 2.5 10*3/uL (ref 0.7–4.0)
LYMPHS PCT: 41 %
MCH: 26.4 pg (ref 26.0–34.0)
MCHC: 31.9 g/dL (ref 30.0–36.0)
MCV: 82.6 fL (ref 78.0–100.0)
MONOS PCT: 10 %
Monocytes Absolute: 0.6 10*3/uL (ref 0.1–1.0)
NEUTROS ABS: 2.7 10*3/uL (ref 1.7–7.7)
NEUTROS PCT: 45 %
Platelets: 334 10*3/uL (ref 150–400)
RBC: 3.68 MIL/uL — ABNORMAL LOW (ref 4.22–5.81)
RDW: 13.4 % (ref 11.5–15.5)
WBC: 6 10*3/uL (ref 4.0–10.5)

## 2016-12-16 LAB — COMPREHENSIVE METABOLIC PANEL WITH GFR
ALT: 52 U/L (ref 17–63)
AST: 32 U/L (ref 15–41)
Albumin: 3.6 g/dL (ref 3.5–5.0)
Alkaline Phosphatase: 87 U/L (ref 38–126)
Anion gap: 7 (ref 5–15)
BUN: 17 mg/dL (ref 6–20)
CO2: 29 mmol/L (ref 22–32)
Calcium: 9.7 mg/dL (ref 8.9–10.3)
Chloride: 103 mmol/L (ref 101–111)
Creatinine, Ser: 0.88 mg/dL (ref 0.61–1.24)
GFR calc Af Amer: >60 mL/min
GFR calc non Af Amer: >60 mL/min
Glucose, Bld: 120 mg/dL — ABNORMAL HIGH (ref 65–99)
Potassium: 4.1 mmol/L (ref 3.5–5.1)
Sodium: 139 mmol/L (ref 135–145)
Total Bilirubin: 0.7 mg/dL (ref 0.3–1.2)
Total Protein: 6.9 g/dL (ref 6.5–8.1)

## 2016-12-16 LAB — PHOSPHORUS: PHOSPHORUS: 4.1 mg/dL (ref 2.5–4.6)

## 2016-12-16 LAB — GLUCOSE, CAPILLARY
GLUCOSE-CAPILLARY: 170 mg/dL — AB (ref 65–99)
Glucose-Capillary: 162 mg/dL — ABNORMAL HIGH (ref 65–99)
Glucose-Capillary: 281 mg/dL — ABNORMAL HIGH (ref 65–99)
Glucose-Capillary: 115 mg/dL — ABNORMAL HIGH (ref 65–99)

## 2016-12-16 LAB — MAGNESIUM: Magnesium: 2.1 mg/dL (ref 1.7–2.4)

## 2016-12-16 NOTE — Progress Notes (Signed)
PROGRESS NOTE    Mike [name]  WUJ:[id number] DOB: [birth year] DOA: 11/26/2016 PCP: [name], NP   Brief Narrative:  The patient is a 56yom PMH DM, homelessness 23 ED visits in 6 months, 8 admissions in 6 months, presented with malaise, found to have hyperglycemia, SBP 200s, disheveled. Admitted for hyperosmolar hyperglycemic state, treated with IV insulin and transitioned to subq insulin. Hyperglycemia has been recurrent secondary to non-compliance with diet. Seen by psychiatry and felt to not have capacity for self-care of DM. Patient has had 23 ED visits and 8 hospitalizations in 6 months. CM/CSW exploring guardianship. Patient would be best served with controlled setting long term for care of DM and pending APS Case Review. Per CSW APS is still reviewing Case.    12/16/16 - No new complaints. Blood sugar is controlled. Pursue disposition.  Assessment & Plan:   Principal Problem:   Uncontrolled type 2 diabetes mellitus with hyperosmolar nonketotic hyperglycemia (HCC) Active Problems:   Homelessness   Protein-calorie malnutrition, severe   Essential hypertension   Normocytic anemia   HLD (hyperlipidemia)   Evaluation by psychiatric service required   Hyperphosphatemia  DM type 2 uncontrolled with intermittent Hypoglycemia -Hgb A1c 13% -Unsafe to Discharge home on po Medications  -Hyperosmolor hyperglycemia on admission secondary to noncompliance. -Per psychiatry "does not have capacity to manage treatment of diabetes" -Await CSW/CM input on disposition.  -Increased Novolog 70/30 mix 28U qhs to 30U qHS and c/w 25 U breakfast at the recommendation of Diabetes Education Coordinator  -Sensitive Novolog SSI AC/HS changed to Moderate Novolog SSI AC with hypoglycemia protocol -CBG's ranging from 129-281  Normocytic Anemia -Has been stable; Hb/Hct went from 9.0/28.8 -> 8.9/28.1 -> 9.0/28.7 -> 10.0/31.6 -> 9.2/29.2 -No obvious sign of bleeding; Continue to Monitor for S/Sx  of Bleeding -C/w Ferrous sulfate 325 mg po Daily  -Repeat CBC in AM   HLD -C/w Atorvastatin 40 mg po qHS  Essential HTN -Stable -Hypertensive urgency on admission -C/w Lisinopril 40 mg po Daily, Chlorthalidone 25 mg po Daily -C/w Hydralazine 10 mg IV q4hprn for SBP>170 or DBP>90  Hyperphosphatemia, improved -Mild. Patient's Phos Level was 4.7 and now 3.6 -Need to watch as patient is on Protein Supplements -Continue to Monitor and Repeat in AM   Severe Protein Calorie Malnutrition -In the Context of Social or Environmental Circumstances -Nutritionist consulted last month -Albumin was 3.5 -C/w MVI 1 tab po Daily -C/w Protein Supplement Liquid 11 oz po BID between meals  Noncompliance most likely 2/2 to Financial Constraint/Homelessness -Case management/Social worker for placement -Per Social Worker Note CSW discussed with APS Case Manager and currently awaiting for follow up and awaiting APS involvement. **Discussed with CSW and awaiting update from APS Case Manager and return phone call/update on investigation. Per CSW Case is still being reviewed by APS and APS Case Manager contacting patient's PCP for further information.   DVT prophylaxis: Lovenox 40 mg sq Daily Code Status: FULL CODE Family Communication: No family present at bedside Disposition Plan: Possible Group Home; Pending APS Case Review; Need Social Work Follow Up  Consultants:   Social Worker/Case Management    Procedures: None   Antimicrobials: Anti-infectives (From admission, onward)   None     Subjective: Seen and examined at bedside and he had no complaints. No Nausea or vomiting. No other concerns at this time.   Objective: Vitals:   12/15/16 0434 12/15/16 1313 12/15/16 2159 12/16/16 0513  BP: (!) 149/60 (!) 149/78 (!) 158/72 139/73  Pulse: (!) 56 Marland Kitchen)  59 (!) 56 69  Resp: 18 19 18 19   Temp: 98.7 F (37.1 C) 98.3 F (36.8 C) 98.5 F (36.9 C) 99.2 F (37.3 C)  TempSrc: Oral Oral Oral     SpO2: 100% 100% 100% 100%  Weight:      Height:        Intake/Output Summary (Last 24 hours) at 12/16/2016 1312 Last data filed at 12/16/2016 0800 Gross per 24 hour  Intake -  Output 2301 ml  Net -2301 ml   Filed Weights   11/26/16 0050 11/27/16 1647  Weight: 77.1 kg (170 lb) 74.5 kg (164 lb 3.9 oz)    Examination: Physical Exam:  Constitutional: Disheveled appearing [name] in NAD who appears calm Eyes: Sclerae anicteric. Lids normal ENMT: External Ears and nose appear normal. MMM Neck: Supple with no JVD Respiratory: CTAB; No appreciable wheezing/rales/rhonchi. Unlabored breathing Cardiovascular: RRR; S1, S2. No appreciable LE Edema Abdomen: Soft, NT, ND. Bowel sounds present GU: Deferred Musculoskeletal: No contractures. No cyanosis Skin: Warm and Dry. No appreciable rashes or lesions on a limited skin eval. Nails are unkepmt Neurologic: CN 2-12 grossly intact. No appreciable focal deficits Psychiatric: Normal appearing mood and affect. Impaired judgement and insight. Awake and alert  Data Reviewed: I have personally reviewed following labs and imaging studies  CBC: Recent Labs  Lab 12/12/16 0537 12/13/16 0521 12/14/16 0502 12/15/16 0417 12/16/16 0451  WBC 5.1 5.4 6.2 5.8 6.0  NEUTROABS  --  2.6 2.7 2.5 2.7  HGB 8.9* 9.0* 10.0* 9.2* 9.7*  HCT 28.1* 28.7* 31.6* 29.2* 30.4*  MCV 82.9 83.4 82.5 82.5 82.6  PLT 357 331 385 337 334   Basic Metabolic Panel: Recent Labs  Lab 12/12/16 0537 12/13/16 0521 12/14/16 0502 12/15/16 0417 12/16/16 0451  NA 137 140 136 137 139  K 4.3 4.3 4.1 4.1 4.1  CL 101 105 101 102 103  CO2 28 29 28 28 29   GLUCOSE 167* 161* 263* 211* 120*  BUN 22* 21* 24* 22* 17  CREATININE 0.81 0.84 0.95 0.83 0.88  CALCIUM 9.3 9.5 9.7 9.5 9.7  MG  --  2.0 2.1 2.0 2.1  PHOS  --  4.7* 4.0 3.6 4.1   GFR: Estimated Creatinine Clearance: 99.9 mL/min (by C-G formula based on SCr of 0.88 mg/dL). Liver Function Tests: Recent Labs  Lab 12/13/16 0521  12/14/16 0502 12/15/16 0417 12/16/16 0451  AST 28 35 32 32  ALT 49 55 48 52  ALKPHOS 83 95 81 87  BILITOT 0.4 0.7 0.5 0.7  PROT 6.6 7.4 6.6 6.9  ALBUMIN 3.4* 4.0 3.5 3.6   No results for input(s): LIPASE, AMYLASE in the last 168 hours. No results for input(s): AMMONIA in the last 168 hours. Coagulation Profile: No results for input(s): INR, PROTIME in the last 168 hours. Cardiac Enzymes: No results for input(s): CKTOTAL, CKMB, CKMBINDEX, TROPONINI in the last 168 hours. BNP (last 3 results) No results for input(s): PROBNP in the last 8760 hours. HbA1C: No results for input(s): HGBA1C in the last 72 hours. CBG: Recent Labs  Lab 12/15/16 1228 12/15/16 1729 12/15/16 2155 12/16/16 0827 12/16/16 1233  GLUCAP 193* 383* 159* 170* 162*   Lipid Profile: No results for input(s): CHOL, HDL, LDLCALC, TRIG, CHOLHDL, LDLDIRECT in the last 72 hours. Thyroid Function Tests: No results for input(s): TSH, T4TOTAL, FREET4, T3FREE, THYROIDAB in the last 72 hours. Anemia Panel: No results for input(s): VITAMINB12, FOLATE, FERRITIN, TIBC, IRON, RETICCTPCT in the last 72 hours. Sepsis Labs: No results for  input(s): PROCALCITON, LATICACIDVEN in the last 168 hours.  No results found for this or any previous visit (from the past 240 hour(s)).   Radiology Studies: No results found.  Scheduled Meds: . aspirin EC  81 mg Oral Daily  . atorvastatin  40 mg Oral q1800  . chlorthalidone  25 mg Oral Daily  . enoxaparin (LOVENOX) injection  40 mg Subcutaneous Daily  . ferrous sulfate  325 mg Oral Q breakfast  . insulin aspart  0-15 Units Subcutaneous TID WC  . insulin aspart protamine- aspart  30 Units Subcutaneous Q supper   And  . insulin aspart protamine- aspart  25 Units Subcutaneous Q breakfast  . lisinopril  40 mg Oral Daily  . multivitamin  1 tablet Oral Daily  . protein supplement shake  11 oz Oral BID BM   Continuous Infusions:   LOS: 20 days   Barnetta ChapelSylvester I Kyarra Vancamp, MD [facility] Pager [phone]  If 7PM-7AM, please contact night-coverage [email or web address] Password [email or web address] 12/16/2016, 1:12 PM

## 2016-12-17 LAB — CREATININE, SERUM
Creatinine, Ser: 0.92 mg/dL (ref 0.61–1.24)
GFR calc non Af Amer: >60 mL/min (ref >60)

## 2016-12-17 LAB — GLUCOSE, CAPILLARY
GLUCOSE-CAPILLARY: 196 mg/dL — AB (ref 65–99)
GLUCOSE-CAPILLARY: 269 mg/dL — AB (ref 65–99)
GLUCOSE-CAPILLARY: 191 mg/dL — AB (ref 65–99)
GLUCOSE-CAPILLARY: 48 mg/dL — AB (ref 65–99)
GLUCOSE-CAPILLARY: 106 mg/dL — AB (ref 65–99)

## 2016-12-17 NOTE — Progress Notes (Signed)
Hypoglycemic Event  CBG:48  Treatment: 15 GM carbohydrate snack  Symptoms: None  Follow-up CBG: Time:2234CBG Result:106  Possible Reasons for Event: Unknown  Comments/MD notified:none    [place], Raylene EvertsNancy Blythe

## 2016-12-17 NOTE — Progress Notes (Signed)
PROGRESS NOTE    Mike [name]  ZOX:[id number] DOB: [birth year] DOA: 11/26/2016 PCP: [name], NP   Brief Narrative:  The patient is a 56yom PMH DM, homelessness 23 ED visits in 6 months, 8 admissions in 6 months, presented with malaise, found to have hyperglycemia, SBP 200s, disheveled. Admitted for hyperosmolar hyperglycemic state, treated with IV insulin and transitioned to subq insulin. Hyperglycemia has been recurrent secondary to non-compliance with diet. Seen by psychiatry and felt to not have capacity for self-care of DM. Patient has had 23 ED visits and 8 hospitalizations in 6 months. CM/CSW exploring guardianship. Patient would be best served with controlled setting long term for care of DM and pending APS Case Review. Per CSW APS is still reviewing Case.    12/17/16 - No new complaints. Blood sugar is reasonably controlled. Pursue disposition.  Assessment & Plan:   Principal Problem:   Uncontrolled type 2 diabetes mellitus with hyperosmolar nonketotic hyperglycemia (HCC) Active Problems:   Homelessness   Protein-calorie malnutrition, severe   Essential hypertension   Normocytic anemia   HLD (hyperlipidemia)   Evaluation by psychiatric service required   Hyperphosphatemia  DM type 2 uncontrolled with intermittent Hypoglycemia -Hgb A1c 13% -Unsafe to Discharge home on po Medications  -Hyperosmolor hyperglycemia on admission secondary to noncompliance. -Per psychiatry "does not have capacity to manage treatment of diabetes" -Await CSW/CM input on disposition.  -Increased Novolog 70/30 mix 28U qhs to 30U qHS and c/w 25 U breakfast at the recommendation of Diabetes Education Coordinator  -Sensitive Novolog SSI AC/HS changed to Moderate Novolog SSI AC with hypoglycemia protocol -CBG's ranging from 129-281  Normocytic Anemia -Has been stable; Hb/Hct went from 9.0/28.8 -> 8.9/28.1 -> 9.0/28.7 -> 10.0/31.6 -> 9.2/29.2 -No obvious sign of bleeding; Continue to Monitor  for S/Sx of Bleeding -C/w Ferrous sulfate 325 mg po Daily  -Repeat CBC in AM   HLD -C/w Atorvastatin 40 mg po qHS  Essential HTN -Stable -Hypertensive urgency on admission -C/w Lisinopril 40 mg po Daily, Chlorthalidone 25 mg po Daily -C/w Hydralazine 10 mg IV q4hprn for SBP>170 or DBP>90  Hyperphosphatemia, improved -Mild. Patient's Phos Level was 4.7 and now 3.6 -Need to watch as patient is on Protein Supplements -Continue to Monitor and Repeat in AM   Severe Protein Calorie Malnutrition -In the Context of Social or Environmental Circumstances -Nutritionist consulted last month -Albumin was 3.5 -C/w MVI 1 tab po Daily -C/w Protein Supplement Liquid 11 oz po BID between meals  Noncompliance most likely 2/2 to Financial Constraint/Homelessness -Case management/Social worker for placement -Per Social Worker Note CSW discussed with APS Case Manager and currently awaiting for follow up and awaiting APS involvement. **Discussed with CSW and awaiting update from APS Case Manager and return phone call/update on investigation. Per CSW Case is still being reviewed by APS and APS Case Manager contacting patient's PCP for further information.   DVT prophylaxis: Lovenox 40 mg sq Daily Code Status: FULL CODE Family Communication: No family present at bedside Disposition Plan: Possible Group Home; Pending APS Case Review; Need Social Work Follow Up  Consultants:   Social Worker/Case Management    Procedures: None   Antimicrobials: Anti-infectives (From admission, onward)   None     Subjective: Seen and examined at bedside and he had no complaints. No Nausea or vomiting. No other concerns at this time.   Objective: Vitals:   12/16/16 0513 12/16/16 1434 12/16/16 2018 12/17/16 0458  BP: 139/73 (!) 152/68 (!) 140/55 (!) 156/76  Pulse: 69 79  72 70  Resp: 19 18 18 17   Temp: 99.2 F (37.3 C) 99 F (37.2 C) 98.8 F (37.1 C) 97.7 F (36.5 C)  TempSrc:  Oral Oral Oral  SpO2:  100% 100% 100% 100%  Weight:      Height:        Intake/Output Summary (Last 24 hours) at 12/17/2016 1343 Last data filed at 12/17/2016 1315 Gross per 24 hour  Intake -  Output 1000 ml  Net -1000 ml   Filed Weights   11/26/16 0050 11/27/16 1647  Weight: 77.1 kg (170 lb) 74.5 kg (164 lb 3.9 oz)    Examination: Physical Exam:  Constitutional: Disheveled appearing [name] in NAD who appears calm Eyes: Sclerae anicteric. Lids normal ENMT: External Ears and nose appear normal. MMM Neck: Supple with no JVD Respiratory: CTAB; No appreciable wheezing/rales/rhonchi. Unlabored breathing Cardiovascular: RRR; S1, S2. No appreciable LE Edema Abdomen: Soft, NT, ND. Bowel sounds present GU: Deferred Musculoskeletal: No contractures. No cyanosis Skin: Warm and Dry. No appreciable rashes or lesions on a limited skin eval. Nails are unkepmt Neurologic: CN 2-12 grossly intact. No appreciable focal deficits Psychiatric: Normal appearing mood and affect. Impaired judgement and insight. Awake and alert  Data Reviewed: I have personally reviewed following labs and imaging studies  CBC: Recent Labs  Lab 12/12/16 0537 12/13/16 0521 12/14/16 0502 12/15/16 0417 12/16/16 0451  WBC 5.1 5.4 6.2 5.8 6.0  NEUTROABS  --  2.6 2.7 2.5 2.7  HGB 8.9* 9.0* 10.0* 9.2* 9.7*  HCT 28.1* 28.7* 31.6* 29.2* 30.4*  MCV 82.9 83.4 82.5 82.5 82.6  PLT 357 331 385 337 334   Basic Metabolic Panel: Recent Labs  Lab 12/12/16 0537 12/13/16 0521 12/14/16 0502 12/15/16 0417 12/16/16 0451 12/17/16 0447  NA 137 140 136 137 139  --   K 4.3 4.3 4.1 4.1 4.1  --   CL 101 105 101 102 103  --   CO2 28 29 28 28 29   --   GLUCOSE 167* 161* 263* 211* 120*  --   BUN 22* 21* 24* 22* 17  --   CREATININE 0.81 0.84 0.95 0.83 0.88 0.92  CALCIUM 9.3 9.5 9.7 9.5 9.7  --   MG  --  2.0 2.1 2.0 2.1  --   PHOS  --  4.7* 4.0 3.6 4.1  --    GFR: Estimated Creatinine Clearance: 95.6 mL/min (by C-G formula based on SCr of 0.92  mg/dL). Liver Function Tests: Recent Labs  Lab 12/13/16 0521 12/14/16 0502 12/15/16 0417 12/16/16 0451  AST 28 35 32 32  ALT 49 55 48 52  ALKPHOS 83 95 81 87  BILITOT 0.4 0.7 0.5 0.7  PROT 6.6 7.4 6.6 6.9  ALBUMIN 3.4* 4.0 3.5 3.6   No results for input(s): LIPASE, AMYLASE in the last 168 hours. No results for input(s): AMMONIA in the last 168 hours. Coagulation Profile: No results for input(s): INR, PROTIME in the last 168 hours. Cardiac Enzymes: No results for input(s): CKTOTAL, CKMB, CKMBINDEX, TROPONINI in the last 168 hours. BNP (last 3 results) No results for input(s): PROBNP in the last 8760 hours. HbA1C: No results for input(s): HGBA1C in the last 72 hours. CBG: Recent Labs  Lab 12/16/16 1233 12/16/16 1704 12/16/16 2023 12/17/16 0750 12/17/16 1208  GLUCAP 162* 281* 115* 196* 269*   Lipid Profile: No results for input(s): CHOL, HDL, LDLCALC, TRIG, CHOLHDL, LDLDIRECT in the last 72 hours. Thyroid Function Tests: No results for input(s): TSH, T4TOTAL, FREET4, T3FREE, THYROIDAB  in the last 72 hours. Anemia Panel: No results for input(s): VITAMINB12, FOLATE, FERRITIN, TIBC, IRON, RETICCTPCT in the last 72 hours. Sepsis Labs: No results for input(s): PROCALCITON, LATICACIDVEN in the last 168 hours.  No results found for this or any previous visit (from the past 240 hour(s)).   Radiology Studies: No results found.  Scheduled Meds: . aspirin EC  81 mg Oral Daily  . atorvastatin  40 mg Oral q1800  . chlorthalidone  25 mg Oral Daily  . enoxaparin (LOVENOX) injection  40 mg Subcutaneous Daily  . ferrous sulfate  325 mg Oral Q breakfast  . insulin aspart  0-15 Units Subcutaneous TID WC  . insulin aspart protamine- aspart  30 Units Subcutaneous Q supper   And  . insulin aspart protamine- aspart  25 Units Subcutaneous Q breakfast  . lisinopril  40 mg Oral Daily  . multivitamin  1 tablet Oral Daily  . protein supplement shake  11 oz Oral BID BM   Continuous  Infusions:   LOS: 21 days   Barnetta ChapelSylvester I Kattaleya Alia, MD [facility] Pager [phone]  If 7PM-7AM, please contact night-coverage [email or web address] Password [email or web address] 12/17/2016, 1:43 PM

## 2016-12-18 LAB — GLUCOSE, CAPILLARY
GLUCOSE-CAPILLARY: 260 mg/dL — AB (ref 65–99)
GLUCOSE-CAPILLARY: 145 mg/dL — AB (ref 65–99)
Glucose-Capillary: 110 mg/dL — ABNORMAL HIGH (ref 65–99)
Glucose-Capillary: 286 mg/dL — ABNORMAL HIGH (ref 65–99)
Glucose-Capillary: 51 mg/dL — ABNORMAL LOW (ref 65–99)

## 2016-12-18 MED ORDER — INSULIN ASPART 100 UNIT/ML ~~LOC~~ SOLN
0.0000 [IU] | Freq: Three times a day (TID) | SUBCUTANEOUS | Status: DC
  Administered 2016-12-19: 2 [IU] via SUBCUTANEOUS
  Administered 2016-12-19: 5 [IU] via SUBCUTANEOUS

## 2016-12-18 NOTE — Social Work (Signed)
CSW [facility] a call from [facility] worker, Junius Finneramela Bright, [phone], advising that she is involved in the case with patient due to the children that are in their custody.  She wanted to confirm pt's location and requested that CSW let her know what the plan will be for him at discharge.  CSW f/u with APS case manager, Myrtis SerAdrian Carmichael [phone], for outcome of investigation.  Keene BreathPatricia Coleston Dirosa, [name] Clinical Social Worker [phone]

## 2016-12-18 NOTE — Progress Notes (Signed)
Inpatient Diabetes Program Recommendations  AACE/ADA: New Consensus Statement on Inpatient Glycemic Control (2015)  Target Ranges:  Prepandial:   less than 140 mg/dL      Peak postprandial:   less than 180 mg/dL (1-2 hours)      Critically ill patients:  140 - 180 mg/dL   Lab Results  Component Value Date   GLUCAP 110 (H) 12/18/2016   HGBA1C 13.0 (H) 11/07/2016    Review of Glycemic ControlResults for Mike [name], Mike [name] (MRN [id number]) as of 12/18/2016 13:52  Ref. Range 12/17/2016 17:01 12/17/2016 21:55 12/17/2016 22:33 12/18/2016 08:20 12/18/2016 12:14  Glucose-Capillary Latest Ref Range: 65 - 99 mg/dL 782191 (H) 48 (L) 956106 (H) 260 (H) 110 (H)   Review of Glycemic Control  Diabetes history: DM 2 Outpatient Diabetes medications: 70/30 25 units BID, Glyburide 5 mg BID Current orders for Inpatient glycemic control: 70/30 25 units qam, 30 units qpm Novolog moderate tid with meals   Inpatient Diabetes Program Recommendations:   Note mild low last night.  Consider reducing Novolog correction to sensitive tid with meals.   Thanks, Beryl MeagerJenny Yen Wandell, RN, BC-ADM Inpatient Diabetes Coordinator Pager [phone] (8a-5p)

## 2016-12-18 NOTE — Progress Notes (Signed)
  PROGRESS NOTE  Mike [name] WJX:[id number] DOB: 1960/07/22 DOA: 11/26/2016 PCP: [name], [name], NP  Brief Narrative: 56yom PMH DM, homelessness 23 ED visits in 6 months, 8 admissions in 6 months, presented with malaise, found to have hyperglycemia, SBP 200s, disheveled. Admitted for hyperosmolar hyperglycemic state, treated with IV insulin and transitioned to subq insulin. Hyperglycemia has been recurrent secondary to non-compliance with diet. Seen by psychiatry and felt to not have capacity for self-care of DM. Patient has had 23 ED visits and 8 hospitalizations in 6 months. CM/CSW exploring guardianship. Patient would be best served with controlled setting long term for care of DM.  Assessment/Plan DM type 2 uncontrolled with Hgb A1c 13%, with hyperosmolor hyperglycemia on admission secondary to noncompliance. Seen by psychiatry "does not have capacity to manage treatment of diabetes" - decrease SSI to sensitive. Continue Novolog 70/30.  Normocytic anemia - stable up to 12/1  Essential HTN, s/p hypertensive urgency on admission - remains stable.  Noncompliance  Homelessness - await CSW for disposition  DVT prophylaxis: enoxaparin Code Status: full Family Communication: none Disposition Plan: OT recommended SNF    Brendia Sacksaniel Goodrich, MD  [facility] Direct contact: [phone] --Via amion app OR  --[email or web address]; password [email or web address]  7PM-7AM contact night coverage as above 12/18/2016, 3:23 PM  LOS: 22 days   Consultants:  Psychiatry   Procedures:    Antimicrobials:    Interval history/Subjective: No complaints noted.  Objective: Vitals:  Vitals:   12/18/16 0504 12/18/16 1300  BP: (!) 153/79 (!) 158/79  Pulse: 62 77  Resp: 18 18  Temp: 98.6 F (37 C) 98 F (36.7 C)  SpO2: 100% 100%    Exam:  Constitutional:   . Appears calm and comfortable Respiratory:  . CTA bilaterally, no w/r/r.  . Respiratory effort normal.  Cardiovascular:  . RRR, no  m/r/g Psychiatric:  . Mental status o Mood, affect appropriate  I have personally reviewed the following:   Labs:  CBG labile, 48-260.    Scheduled Meds: . aspirin EC  81 mg Oral Daily  . atorvastatin  40 mg Oral q1800  . chlorthalidone  25 mg Oral Daily  . enoxaparin (LOVENOX) injection  40 mg Subcutaneous Daily  . ferrous sulfate  325 mg Oral Q breakfast  . insulin aspart  0-15 Units Subcutaneous TID WC  . insulin aspart protamine- aspart  30 Units Subcutaneous Q supper   And  . insulin aspart protamine- aspart  25 Units Subcutaneous Q breakfast  . lisinopril  40 mg Oral Daily  . multivitamin  1 tablet Oral Daily  . protein supplement shake  11 oz Oral BID BM   Continuous Infusions:  Principal Problem:   Uncontrolled type 2 diabetes mellitus with hyperosmolar nonketotic hyperglycemia (HCC) Active Problems:   Homelessness   Protein-calorie malnutrition, severe   Essential hypertension   Normocytic anemia   HLD (hyperlipidemia)   Evaluation by psychiatric service required   Hyperphosphatemia   LOS: 22 days

## 2016-12-18 NOTE — Progress Notes (Signed)
Hypoglycemic Event  CBG: 51  Treatment: 15 GM carbohydrate snack  Symptoms: Hungry  Follow-up CBG: Time:2220CBG Result:145  Possible Reasons for Event: Unknown  Comments/MD notified:none    [place], Raylene EvertsNancy Blythe

## 2016-12-19 DIAGNOSIS — E119 Type 2 diabetes mellitus without complications: Secondary | ICD-10-CM

## 2016-12-19 LAB — GLUCOSE, CAPILLARY
Glucose-Capillary: 181 mg/dL — ABNORMAL HIGH (ref 65–99)
Glucose-Capillary: 294 mg/dL — ABNORMAL HIGH (ref 65–99)
Glucose-Capillary: 333 mg/dL — ABNORMAL HIGH (ref 65–99)
Glucose-Capillary: 84 mg/dL (ref 65–99)

## 2016-12-19 NOTE — Social Work (Addendum)
CSW received call back from APS case manager, [name], advising that patient is not a citizen per social service. She indicated that patient applied for medicaid and was denied about a year ago. She confirmed that patient unable to get social security disability because he is not a citizen. She confirmed that patient no longer receiving food stamps as it appears he may not have completed the re-certification process.  APS has not made there determination of what to do with patient or if they can provide any assistance.  CSW advised APS case manager that CSW look into finding patient a skilled nursing placement for 30 days and then patient will have to return to community. CSW will keep APS case manager abreast of the outcome.  CSW faxed out to SNF's to see if any local SNF's can offer a bed for 30 days to monitor insulin.  CSW will continue to follow up.  CSW contacted financial counselors again to see if they can assist with the disability application and refer patient to [facility], which assist community the disability process. It is still not clear if patient is a permanent resident, because that would enable him to get disability. CSW left message on voicemail of financial counselor to f/u.  CSW is looking into [employer], a resource in community that may provide some information and resources for patient.  CSW have not received any bed offers from SNF's willing to accept a LOG.   CSW will continue to follow up.  Keene BreathPatricia Mei Suits, [name] Clinical Social Worker [phone]

## 2016-12-19 NOTE — Social Work (Addendum)
CSW discussed with Clinical Director and the best option for patient is a 30 day LOG for short term placement.  CSW discussed case with APS Case Worker and she indicated that she has not confirmed if patient is a citizen yet. CSW was discussing with her social security disability and APS worker indicated that it was reported to her that patient was denied by social security because he is not a citizen however, she has not confirmed if this is accurate.  CSW discussed with her attempting to find short term 30 day placement for patient locally and the barriers as patient may have to be placed further away for 30 days. APS worker indicated that she will update CSW on the outcome of citizenship or if patient has his green card.  CSW will continue to follow-up.  Keene BreathPatricia Bevan Vu, [name] Clinical Social Worker [phone]

## 2016-12-19 NOTE — Progress Notes (Signed)
Inpatient Diabetes Program Recommendations  AACE/ADA: New Consensus Statement on Inpatient Glycemic Control (2015)  Target Ranges:  Prepandial:   less than 140 mg/dL      Peak postprandial:   less than 180 mg/dL (1-2 hours)      Critically ill patients:  140 - 180 mg/dL   Results for Mike [name], Mike [name] (MRN [id number]) as of 12/19/2016 11:31  Ref. Range 12/18/2016 12:14 12/18/2016 17:21 12/18/2016 21:45 12/18/2016 22:22 12/19/2016 08:11  Glucose-Capillary Latest Ref Range: 65 - 99 mg/dL 782110 (H) 956286 (H) 51 (L) 145 (H) 181 (H)    Review of Glycemic Control  Inpatient Diabetes Program Recommendations:    Hypoglycemia in the 50's last night with correction scale in addition to the 70/30 dose. Consider d/cing Correction and using 70/30 dose only to also assist in transition for outpatient control.  Thanks,  Christena DeemShannon Winston Misner RN, MSN, [facility] Inpatient Diabetes Coordinator Team Pager [phone] (8a-5p)

## 2016-12-19 NOTE — NC FL2 (Signed)
[place] MEDICAID FL2 LEVEL OF CARE SCREENING TOOL     IDENTIFICATION  Patient Name: Mike [name] Birthdate: 08/31/1960 Sex: male Admission Date (Current Location): 11/26/2016  [facility] [place]   Facility and Address:  [facility], [address], [place], [place] [address]      Provider Number: [id number]  Attending Physician Name and Address:  Standley BrookingGoodrich, Daniel P, MD  Relative Name and Phone Number:  Joanne GavelVeronica Lindsey, friend, [phone]    Current Level of Care: Hospital Recommended Level of Care: Skilled Nursing Facility Prior Approval Number:    Date Approved/Denied:   PASRR Number: [id number]  Discharge Plan: SNF    Current Diagnoses: Patient Active Problem List   Diagnosis Date Noted  . Hyperphosphatemia 12/13/2016  . Evaluation by psychiatric service required 11/30/2016  . Lactic acidosis 11/18/2016  . Normocytic anemia 11/06/2016  . HLD (hyperlipidemia) 11/06/2016  . Adjustment disorder with other symptoms 11/05/2016  . Uncontrolled type 2 diabetes mellitus with hyperosmolar nonketotic hyperglycemia (HCC) 08/24/2016  . Essential hypertension 08/24/2016  . Acute ischemic stroke (HCC)   . Protein-calorie malnutrition, severe 12/24/2014  . Homelessness 12/23/2014  . DM (diabetes mellitus) (HCC) 12/23/2014  . Intermittent palpitations 12/23/2014  . Tobacco abuse 12/23/2014  . Pleuritic chest pain 12/23/2014  . Abnormal EKG 12/23/2014  . Type II diabetes mellitus with renal manifestations (HCC)     Orientation RESPIRATION BLADDER Height & Weight     Self, Time, Place  Normal Continent Weight: 164 lb 3.9 oz (74.5 kg) Height:  6\' 4"  (193 cm)  BEHAVIORAL SYMPTOMS/MOOD NEUROLOGICAL BOWEL NUTRITION STATUS      Continent Diet(See DC Summary)  AMBULATORY STATUS COMMUNICATION OF NEEDS Skin   Independent Verbally Normal                       Personal Care Assistance Level of Assistance  Bathing,  Feeding, Dressing Bathing Assistance: Independent Feeding assistance: Independent Dressing Assistance: Independent     Functional Limitations Info  Sight, Hearing, Speech Sight Info: Adequate Hearing Info: Adequate Speech Info: Adequate    SPECIAL CARE FACTORS FREQUENCY                       Contractures Contractures Info: Not present    Additional Factors Info  Code Status, Allergies, Insulin Sliding Scale Code Status Info: Full Allergies Info: IBUPROFEN, TYLENOL ACETAMINOPHEN    Insulin Sliding Scale Info: Daily       Current Medications (12/19/2016):  This is the current hospital active medication list Current Facility-Administered Medications  Medication Dose Route Frequency Provider Last Rate Last Dose  . aspirin EC tablet 81 mg  81 mg Oral Daily Opyd, Lavone Neriimothy S, MD   81 mg at 12/19/16 0824  . atorvastatin (LIPITOR) tablet 40 mg  40 mg Oral q1800 Opyd, Lavone Neriimothy S, MD   40 mg at 12/18/16 1726  . chlorthalidone (HYGROTON) tablet 25 mg  25 mg Oral Daily Standley BrookingGoodrich, Daniel P, MD   25 mg at 12/19/16 [id number]  . enoxaparin (LOVENOX) injection 40 mg  40 mg Subcutaneous Daily Opyd, Lavone Neriimothy S, MD   40 mg at 12/19/16 0823  . ferrous sulfate tablet 325 mg  325 mg Oral Q breakfast Opyd, Lavone Neriimothy S, MD   325 mg at 12/19/16 0824  . hydrALAZINE (APRESOLINE) injection 10 mg  10 mg Intravenous Q4H PRN Briscoe Deutscherpyd, Timothy S, MD   10 mg at 12/02/16 2139  . insulin aspart (  novoLOG) injection 0-9 Units  0-9 Units Subcutaneous TID WC Standley BrookingGoodrich, Daniel P, MD   2 Units at 12/19/16 0825  . insulin aspart protamine- aspart (NOVOLOG MIX 70/30) injection 30 Units  30 Units Subcutaneous Q supper Marguerita MerlesSheikh, Omair TekonshaLatif, DO   30 Units at 12/18/16 1754   And  . insulin aspart protamine- aspart (NOVOLOG MIX 70/30) injection 25 Units  25 Units Subcutaneous Q breakfast Marguerita MerlesSheikh, Omair [place], [place]   25 Units at 12/19/16 [id number]  . lisinopril (PRINIVIL,ZESTRIL) tablet 40 mg  40 mg Oral Daily Narda BondsNettey, Ralph A, MD   40 mg at  12/19/16 [id number]  . multivitamin (PROSIGHT) tablet 1 tablet  1 tablet Oral Daily Narda BondsNettey, Ralph A, MD   1 tablet at 12/19/16 [phone]  . protein supplement (PREMIER PROTEIN) liquid  11 oz Oral BID BM Narda BondsNettey, Ralph A, MD   11 oz at 12/18/16 1639     Discharge Medications: Please see discharge summary for a list of discharge medications.  Relevant Imaging Results:  Relevant Lab Results:   Additional Information SS#:[id number]      Needs close monitoring of Insulin for 30 days  Tresa MoorePatricia V Mayur Duman, [name]

## 2016-12-19 NOTE — Progress Notes (Signed)
  PROGRESS NOTE  Mike [name] [name] DOB: June 21, 1960 DOA: 11/26/2016 PCP: Mike SharpsPlacey, [name], NP  Brief Narrative: 56yom PMH DM, homelessness 23 ED visits in 6 months, 8 admissions in 6 months, presented with malaise, found to have hyperglycemia, SBP 200s, disheveled. Admitted for hyperosmolar hyperglycemic state, treated with IV insulin and transitioned to subq insulin. Hyperglycemia has been recurrent secondary to non-compliance with diet. Seen by psychiatry and felt to not have capacity for self-care of DM. Patient has had 23 ED visits and 8 hospitalizations in 6 months. CM/CSW exploring guardianship. Patient would be best served with controlled setting long term for care of DM.  Assessment/Plan DM type 2 uncontrolled with Hgb A1c 13%, with hyperosmolor hyperglycemia on admission secondary to noncompliance. Seen by psychiatry "does not have capacity to manage treatment of diabetes" - one low again last 24 hours. Will d/c SSI and monior on 70/30 without change at this time.  Normocytic anemia - stable up to 12/1  Essential HTN, s/p hypertensive urgency on admission - remains stable 12/4  Noncompliance  Homelessness - CSW working on SNF  Very difficult situation without good long-term solution  DVT prophylaxis: enoxaparin Code Status: full Family Communication: none Disposition Plan: OT recommended SNF    Mike Sacksaniel Mike Chaplin, MD  [facility] Direct contact: [phone] --Via amion app OR  --[email or web address]; password [email or web address]  7PM-7AM contact night coverage as above 12/19/2016, 4:17 PM  LOS: 23 days   Consultants:  Psychiatry   Procedures:    Antimicrobials:    Interval history/Subjective: Feels ok.  Objective: Vitals:  Vitals:   12/19/16 0438 12/19/16 1346  BP: 140/83 136/66  Pulse: 76 76  Resp: 19 18  Temp: 98.1 F (36.7 C) 98.1 F (36.7 C)  SpO2: 99% 100%    Exam:  Constitutional:   . Appears calm and comfortable Respiratory:  . CTA  bilaterally, no w/r/r.  . Respiratory effort normal.  Cardiovascular:  . RRR, no m/r/g Psychiatric:  . Mental status o Mood, affect appropriate  I have personally reviewed the following:   Labs:  CBG labile, 51-294  Scheduled Meds: . aspirin EC  81 mg Oral Daily  . atorvastatin  40 mg Oral q1800  . chlorthalidone  25 mg Oral Daily  . enoxaparin (LOVENOX) injection  40 mg Subcutaneous Daily  . ferrous sulfate  325 mg Oral Q breakfast  . insulin aspart  0-9 Units Subcutaneous TID WC  . insulin aspart protamine- aspart  30 Units Subcutaneous Q supper   And  . insulin aspart protamine- aspart  25 Units Subcutaneous Q breakfast  . lisinopril  40 mg Oral Daily  . multivitamin  1 tablet Oral Daily  . protein supplement shake  11 oz Oral BID BM   Continuous Infusions:  Principal Problem:   Uncontrolled type 2 diabetes mellitus with hyperosmolar nonketotic hyperglycemia (HCC) Active Problems:   Homelessness   Protein-calorie malnutrition, severe   Essential hypertension   Normocytic anemia   HLD (hyperlipidemia)   Evaluation by psychiatric service required   Hyperphosphatemia   LOS: 23 days

## 2016-12-20 DIAGNOSIS — E785 Hyperlipidemia, unspecified: Secondary | ICD-10-CM

## 2016-12-20 LAB — GLUCOSE, CAPILLARY
GLUCOSE-CAPILLARY: 154 mg/dL — AB (ref 65–99)
Glucose-Capillary: 268 mg/dL — ABNORMAL HIGH (ref 65–99)
Glucose-Capillary: 171 mg/dL — ABNORMAL HIGH (ref 65–99)

## 2016-12-20 NOTE — Progress Notes (Signed)
[facility]                                                                              Patient Demographics  Mike [name], is a 56 y.o. male, DOB - Feb 27, 1960, NWG:[id number]  Admit date - 11/26/2016   Admitting Physician Briscoe Deutscherimothy S Opyd, MD  Outpatient Primary MD for the patient is [name], Chales AbrahamsMary Ann, NP  Outpatient specialists:   LOS - 24  days   Medical records reviewed and are as summarized below:    Chief Complaint  Patient presents with  . Hypertension  . Hyperglycemia       Brief summary  56yom PMH DM, homelessness 23 ED visits in 6 months, 8 admissions in 6 months, presented with malaise, found to have hyperglycemia, SBP 200s, disheveled. Admitted for hyperosmolar hyperglycemic state, treated with IV insulin and transitioned to subq insulin. Hyperglycemia has been recurrent secondary to non-compliance with diet. Seen by psychiatry and felt to not have capacity for self-care of DM. Patient has had 23 ED visits and 8 hospitalizations in 6 months. CM/CSW exploring guardianship. Patient would be best served with controlled setting long term for care of DM.  Assessment & Plan    Principal Problem:   Uncontrolled type 2 diabetes mellitus with hyperosmolar nonketotic hyperglycemia (HCC) -Hemoglobin A1c 10%, presented with hyperosmolar hyperglycemia on admission, noncompliant -8 hospitalizations in 6 months, was seen by psychiatry, does not have capacity to manage treatment of diabetes -Sliding scale insulin was DC'd due to hypoglycemia yesterday, will continue 70/30 insulin, 25 units at breakfast, 30 units at supper  Active Problems:   Homelessness/social issues -Patient will be better served in long-term settings for uncontrolled diabetes, unsafe to be homeless with insulin and noncompliance -Social work assisting    Protein-calorie malnutrition, severe -Continue nutritional supplements    Essential hypertension -Presented with hypertensive  urgency on admission, currently stable -Continue chlorthalidone, lisinopril    Normocytic anemia -Currently H&H stable    HLD (hyperlipidemia) -Continue statin  Severe noncompliance -Patient counseled to be compliant with his medical treatment   Code Status: *full  DVT Prophylaxis:  Lovenox  Family Communication: Discussed in detail with the patient, all imaging results, lab results explained to the patient    Disposition Plan: Awaiting skilled nursing facility  Time Spent in minutes   25 minutes  Procedures:  None  Consultants:   Diabetic coordinator  Antimicrobials:      Medications  Scheduled Meds: . aspirin EC  81 mg Oral Daily  . atorvastatin  40 mg Oral q1800  . chlorthalidone  25 mg Oral Daily  . enoxaparin (LOVENOX) injection  40 mg Subcutaneous Daily  . ferrous sulfate  325 mg Oral Q breakfast  . insulin aspart protamine- aspart  30 Units Subcutaneous Q supper   And  . insulin aspart protamine- aspart  25 Units Subcutaneous Q breakfast  . lisinopril  40 mg Oral Daily  . multivitamin  1 tablet Oral Daily  . protein supplement shake  11 oz Oral BID BM   Continuous Infusions: PRN Meds:.hydrALAZINE   Antibiotics   Anti-infectives (From admission, onward)   None  Subjective:   Mike [name] was seen and examined today.  Ambulating in the room.  Patient denies dizziness, chest pain, shortness of breath, abdominal pain, N/V/D/C, new weakness, numbess, tingling. No acute events overnight.    Objective:   Vitals:   12/19/16 0438 12/19/16 1346 12/19/16 2206 12/20/16 0538  BP: 140/83 136/66 (!) 145/65 139/81  Pulse: 76 76 73 75  Resp: 19 18 18 18   Temp: 98.1 F (36.7 C) 98.1 F (36.7 C) 98.3 F (36.8 C) 98.2 F (36.8 C)  TempSrc:  Oral Oral Oral  SpO2: 99% 100% 100% 100%  Weight:      Height:        Intake/Output Summary (Last 24 hours) at 12/20/2016 1236 Last data filed at 12/20/2016 1057 Gross per 24 hour  Intake -  Output  1400 ml  Net -1400 ml     Wt Readings from Last 3 Encounters:  11/27/16 74.5 kg (164 lb 3.9 oz)  11/24/16 77.1 kg (170 lb)  11/23/16 77.1 kg (170 lb)     Exam  General: Alert and oriented x 3, NAD  Eyes:  HEENT:  Atraumatic, normocephalic  Cardiovascular: S1 S2 auscultated, no rubs, murmurs or gallops. Regular rate and rhythm.  Respiratory: Clear to auscultation bilaterally, no wheezing, rales or rhonchi  Gastrointestinal: Soft, nontender, nondistended, + bowel sounds  Ext: no pedal edema bilaterally  Neuro: no new deficit  Musculoskeletal: No digital cyanosis, clubbing  Skin: No rashes  Psych: Normal affect and demeanor, alert and oriented x3    Data Reviewed:  I have personally reviewed following labs and imaging studies  Micro Results No results found for this or any previous visit (from the past 240 hour(s)).  Radiology Reports No results found.  Lab Data:  CBC: Recent Labs  Lab 12/14/16 0502 12/15/16 0417 12/16/16 0451  WBC 6.2 5.8 6.0  NEUTROABS 2.7 2.5 2.7  HGB 10.0* 9.2* 9.7*  HCT 31.6* 29.2* 30.4*  MCV 82.5 82.5 82.6  PLT 385 337 334   Basic Metabolic Panel: Recent Labs  Lab 12/14/16 0502 12/15/16 0417 12/16/16 0451 12/17/16 0447  NA 136 137 139  --   K 4.1 4.1 4.1  --   CL 101 102 103  --   CO2 28 28 29   --   GLUCOSE 263* 211* 120*  --   BUN 24* 22* 17  --   CREATININE 0.95 0.83 0.88 0.92  CALCIUM 9.7 9.5 9.7  --   MG 2.1 2.0 2.1  --   PHOS 4.0 3.6 4.1  --    GFR: Estimated Creatinine Clearance: 95.6 mL/min (by C-G formula based on SCr of 0.92 mg/dL). Liver Function Tests: Recent Labs  Lab 12/14/16 0502 12/15/16 0417 12/16/16 0451  AST 35 32 32  ALT 55 48 52  ALKPHOS 95 81 87  BILITOT 0.7 0.5 0.7  PROT 7.4 6.6 6.9  ALBUMIN 4.0 3.5 3.6   No results for input(s): LIPASE, AMYLASE in the last 168 hours. No results for input(s): AMMONIA in the last 168 hours. Coagulation Profile: No results for input(s): INR, PROTIME  in the last 168 hours. Cardiac Enzymes: No results for input(s): CKTOTAL, CKMB, CKMBINDEX, TROPONINI in the last 168 hours. BNP (last 3 results) No results for input(s): PROBNP in the last 8760 hours. HbA1C: No results for input(s): HGBA1C in the last 72 hours. CBG: Recent Labs  Lab 12/19/16 1227 12/19/16 1721 12/19/16 2152 12/20/16 0758 12/20/16 1144  GLUCAP 294* 333* 84 268* 171*   Lipid  Profile: No results for input(s): CHOL, HDL, LDLCALC, TRIG, CHOLHDL, LDLDIRECT in the last 72 hours. Thyroid Function Tests: No results for input(s): TSH, T4TOTAL, FREET4, T3FREE, THYROIDAB in the last 72 hours. Anemia Panel: No results for input(s): VITAMINB12, FOLATE, FERRITIN, TIBC, IRON, RETICCTPCT in the last 72 hours. Urine analysis:    Component Value Date/Time   COLORURINE STRAW (A) 11/26/2016 0122   APPEARANCEUR CLEAR 11/26/2016 0122   LABSPEC 1.023 11/26/2016 0122   PHURINE 7.0 11/26/2016 0122   GLUCOSEU >=500 (A) 11/26/2016 0122   HGBUR NEGATIVE 11/26/2016 0122   BILIRUBINUR NEGATIVE 11/26/2016 0122   KETONESUR NEGATIVE 11/26/2016 0122   PROTEINUR NEGATIVE 11/26/2016 0122   UROBILINOGEN 1.0 02/07/2011 1605   NITRITE NEGATIVE 11/26/2016 0122   LEUKOCYTESUR NEGATIVE 11/26/2016 0122     Mike [name] M.D. [facility] 12/20/2016, 12:36 PM  Pager: [phone] Between 7am to 7pm - call Pager - [phone]  After 7pm go to [email or web address] - password [email or web address]  Call night coverage person covering after 7pm

## 2016-12-21 LAB — GLUCOSE, CAPILLARY
GLUCOSE-CAPILLARY: 182 mg/dL — AB (ref 65–99)
GLUCOSE-CAPILLARY: 278 mg/dL — AB (ref 65–99)
GLUCOSE-CAPILLARY: 313 mg/dL — AB (ref 65–99)
GLUCOSE-CAPILLARY: 192 mg/dL — AB (ref 65–99)
Glucose-Capillary: 62 mg/dL — ABNORMAL LOW (ref 65–99)

## 2016-12-21 MED ORDER — INSULIN ASPART PROT & ASPART (70-30 MIX) 100 UNIT/ML ~~LOC~~ SUSP
30.0000 [IU] | Freq: Every day | SUBCUTANEOUS | Status: DC
  Administered 2016-12-21: 30 [IU] via SUBCUTANEOUS

## 2016-12-21 MED ORDER — INSULIN ASPART PROT & ASPART (70-30 MIX) 100 UNIT/ML ~~LOC~~ SUSP: 30.0000 [IU] | Freq: Every day | SUBCUTANEOUS | Status: DC

## 2016-12-21 MED ORDER — INSULIN ASPART PROT & ASPART (70-30 MIX) 100 UNIT/ML ~~LOC~~ SUSP: 35.0000 [IU] | Freq: Every day | SUBCUTANEOUS | Status: DC

## 2016-12-21 MED ORDER — INSULIN ASPART 100 UNIT/ML ~~LOC~~ SOLN
5.0000 [IU] | Freq: Once | SUBCUTANEOUS | Status: AC
  Administered 2016-12-21: 5 [IU] via SUBCUTANEOUS

## 2016-12-21 MED ORDER — INSULIN ASPART PROT & ASPART (70-30 MIX) 100 UNIT/ML ~~LOC~~ SUSP
35.0000 [IU] | Freq: Every day | SUBCUTANEOUS | Status: DC
  Administered 2016-12-22: 35 [IU] via SUBCUTANEOUS

## 2016-12-21 NOTE — Social Work (Addendum)
CSW discussed case with Dr. Mervyn SkeetersA and clinical supervisor this morning, anticipated dc today pending SNF placement. CSW [facility] [place] [place] [facility] [place] for SNF placement.  CSW working on placement. CSW called Deb in admission [facility] [place], and she confirmed receipt of clinicals and she has sent them to be reviewed by administrator. She will call CSW back as soon as she knows anything. Discussed dc plan, citizenship, and medicaid.  CSW contacted Bjorn LoserRhonda in admissions at [facility] [place] and left another message as she was paged and unavailable.  [facility] [place] and tamika advised that they do not have any beds.  5:36pm CSW returned to [place] [facility] [place] and she confirmed that they can take patient tomorrow. CSW will f/u with clinical team for dc summary.   CSW called [facility] to confirm transport early afternoon on 12/7. Margaret from [facility] will call CSW back in the morning to confirm if she [facility] [place] [facility].  CSW will f/u.  Keene BreathPatricia Brigido Mera, [name] Clinical Social Worker [phone]

## 2016-12-21 NOTE — Progress Notes (Signed)
Hypoglycemic Event  CBG: 62  Treatment: 15 GM carbohydrate snack  Symptoms: None  Follow-up CBG: Time:2213 CBG Result: 192  Possible Reasons for Event: Unknown  Comments/MD notified:Dr Cathlean MarseillesKakrakandy    Mike [name], Mike PullerImelda Lucille [name]

## 2016-12-21 NOTE — Social Work (Addendum)
CSW f/u with [facility] [place] [facility] [place] to see if they will accept a 30 day LOG.  CSW called Debbie in admissions [facility] [place] and left a message.  CSW contacted Bjorn LoserRhonda in admissions at [facility] [place] and left a voicemail requesting a call back.  CSW received a call back about [facility] to [facility] about assisting pt with applying for disability. CSW f/u with Bonita QuinLinda.  Keene BreathPatricia Kaelem Brach, [name] Clinical Social Worker [phone]

## 2016-12-21 NOTE — Progress Notes (Signed)
[facility]                                                                              Patient Demographics  Mike [name], is a 56 y.o. male, DOB - 01/25/1960, ZOX:[id number]  Admit date - 11/26/2016   Admitting Physician Briscoe Deutscherimothy S Opyd, MD  Outpatient Primary MD for the patient is [name], Chales AbrahamsMary Ann, NP  Outpatient specialists:   LOS - 25  days   Medical records reviewed and are as summarized below:    Chief Complaint  Patient presents with  . Hypertension  . Hyperglycemia       Brief summary  56yom PMH DM, homelessness 23 ED visits in 6 months, 8 admissions in 6 months, presented with malaise, found to have hyperglycemia, SBP 200s, disheveled. Admitted for hyperosmolar hyperglycemic state, treated with IV insulin and transitioned to subq insulin. Hyperglycemia has been recurrent secondary to non-compliance with diet. Seen by psychiatry and felt to not have capacity for self-care of DM. Patient has had 23 ED visits and 8 hospitalizations in 6 months. CM/CSW exploring guardianship. Patient would be best served with controlled setting long term for care of DM.  Assessment & Plan    Principal Problem:   Uncontrolled type 2 diabetes mellitus with hyperosmolar nonketotic hyperglycemia (HCC) -Hemoglobin A1c 10%, presented with hyperosmolar hyperglycemia on admission, noncompliant -8 hospitalizations in 6 months, was seen by psychiatry, does not have capacity to manage treatment of diabetes -CBG still elevated, increase his 70/30 insulin 30 units twice a day with breakfast and supper   Active Problems:   Homelessness/social issues -Patient will be better served in long-term settings for uncontrolled diabetes, unsafe to be homeless with insulin and noncompliance -Social work assisting    Protein-calorie malnutrition, severe -Continue nutritional supplements    Essential hypertension -Presented with hypertensive urgency on admission, currently  stable -Continue chlorthalidone, lisinopril    Normocytic anemia -Currently H&H stable    HLD (hyperlipidemia) -Continue statin  Severe noncompliance -Patient counseled to be compliant with his medical treatment   Code Status: *full  DVT Prophylaxis:  Lovenox  Family Communication: Discussed in detail with the patient, all imaging results, lab results explained to the patient    Disposition Plan: Awaiting placement  Time Spent in minutes   15 minutes  Procedures:  None  Consultants:   Diabetic coordinator  Antimicrobials:      Medications  Scheduled Meds: . aspirin EC  81 mg Oral Daily  . atorvastatin  40 mg Oral q1800  . chlorthalidone  25 mg Oral Daily  . enoxaparin (LOVENOX) injection  40 mg Subcutaneous Daily  . ferrous sulfate  325 mg Oral Q breakfast  . [START ON 12/22/2016] insulin aspart protamine- aspart  30 Units Subcutaneous Q breakfast   And  . insulin aspart protamine- aspart  30 Units Subcutaneous Q supper  . lisinopril  40 mg Oral Daily  . multivitamin  1 tablet Oral Daily  . protein supplement shake  11 oz Oral BID BM   Continuous Infusions: PRN Meds:.hydrALAZINE   Antibiotics   Anti-infectives (From admission, onward)   None  Subjective:   [name] was seen and examined today.  Denies any new complaints. Patient denies dizziness, chest pain, shortness of breath, abdominal pain, N/V/D/C, new weakness, numbess, tingling. No acute events overnight.    Objective:   Vitals:   12/20/16 0538 12/20/16 1345 12/20/16 2136 12/21/16 0446  BP: 139/81 136/77 (!) 143/55 (!) 153/78  Pulse: 75 77 69 62  Resp: 18 18 18 19   Temp: 98.2 F (36.8 C) 98 F (36.7 C) 98.6 F (37 C) 98.5 F (36.9 C)  TempSrc: Oral Oral Oral   SpO2: 100% 100% 100% 100%  Weight:      Height:        Intake/Output Summary (Last 24 hours) at 12/21/2016 1241 Last data filed at 12/21/2016 0940 Gross per 24 hour  Intake 1920 ml  Output 1100 ml  Net 820  ml     Wt Readings from Last 3 Encounters:  11/27/16 74.5 kg (164 lb 3.9 oz)  11/24/16 77.1 kg (170 lb)  11/23/16 77.1 kg (170 lb)     Exam   General: Alert and oriented x 3, NAD  Eyes:   HEENT:    Cardiovascular: S1 S2 clear RRR No pedal edema b/l  Respiratory: Clear to auscultation bilaterally, no wheezing, rales or rhonchi  Gastrointestinal: Soft, nontender, nondistended, + bowel sounds  Ext: no pedal edema bilaterally  Neuro: no new deficit  Musculoskeletal: No digital cyanosis, clubbing  Skin: No rashes  Psych: Normal affect and demeanor, alert and oriented x3    Data Reviewed:  I have personally reviewed following labs and imaging studies  Micro Results No results found for this or any previous visit (from the past 240 hour(s)).  Radiology Reports No results found.  Lab Data:  CBC: Recent Labs  Lab 12/15/16 0417 12/16/16 0451  WBC 5.8 6.0  NEUTROABS 2.5 2.7  HGB 9.2* 9.7*  HCT 29.2* 30.4*  MCV 82.5 82.6  PLT 337 334   Basic Metabolic Panel: Recent Labs  Lab 12/15/16 0417 12/16/16 0451 12/17/16 0447  NA 137 139  --   K 4.1 4.1  --   CL 102 103  --   CO2 28 29  --   GLUCOSE 211* 120*  --   BUN 22* 17  --   CREATININE 0.83 0.88 0.92  CALCIUM 9.5 9.7  --   MG 2.0 2.1  --   PHOS 3.6 4.1  --    GFR: Estimated Creatinine Clearance: 95.6 mL/min (by C-G formula based on SCr of 0.92 mg/dL). Liver Function Tests: Recent Labs  Lab 12/15/16 0417 12/16/16 0451  AST 32 32  ALT 48 52  ALKPHOS 81 87  BILITOT 0.5 0.7  PROT 6.6 6.9  ALBUMIN 3.5 3.6   No results for input(s): LIPASE, AMYLASE in the last 168 hours. No results for input(s): AMMONIA in the last 168 hours. Coagulation Profile: No results for input(s): INR, PROTIME in the last 168 hours. Cardiac Enzymes: No results for input(s): CKTOTAL, CKMB, CKMBINDEX, TROPONINI in the last 168 hours. BNP (last 3 results) No results for input(s): PROBNP in the last 8760  hours. HbA1C: No results for input(s): HGBA1C in the last 72 hours. CBG: Recent Labs  Lab 12/20/16 0758 12/20/16 1144 12/20/16 2135 12/21/16 0811 12/21/16 1135  GLUCAP 268* 171* 154* 182* 278*   Lipid Profile: No results for input(s): CHOL, HDL, LDLCALC, TRIG, CHOLHDL, LDLDIRECT in the last 72 hours. Thyroid Function Tests: No results for input(s): TSH, T4TOTAL, FREET4, T3FREE, THYROIDAB in the  last 72 hours. Anemia Panel: No results for input(s): VITAMINB12, FOLATE, FERRITIN, TIBC, IRON, RETICCTPCT in the last 72 hours. Urine analysis:    Component Value Date/Time   COLORURINE STRAW (A) 11/26/2016 0122   APPEARANCEUR CLEAR 11/26/2016 0122   LABSPEC 1.023 11/26/2016 0122   PHURINE 7.0 11/26/2016 0122   GLUCOSEU >=500 (A) 11/26/2016 0122   HGBUR NEGATIVE 11/26/2016 0122   BILIRUBINUR NEGATIVE 11/26/2016 0122   KETONESUR NEGATIVE 11/26/2016 0122   PROTEINUR NEGATIVE 11/26/2016 0122   UROBILINOGEN 1.0 02/07/2011 1605   NITRITE NEGATIVE 11/26/2016 0122   LEUKOCYTESUR NEGATIVE 11/26/2016 0122     Afnan Emberton M.D. [facility] 12/21/2016, 12:41 PM  Pager: [phone] Between 7am to 7pm - call Pager - [phone]  After 7pm go to [email or web address] - password [email or web address]  Call night coverage person covering after 7pm

## 2016-12-22 LAB — GLUCOSE, CAPILLARY
GLUCOSE-CAPILLARY: 78 mg/dL (ref 65–99)
Glucose-Capillary: 195 mg/dL — ABNORMAL HIGH (ref 65–99)
Glucose-Capillary: 248 mg/dL — ABNORMAL HIGH (ref 65–99)

## 2016-12-22 MED ORDER — INSULIN ASPART PROT & ASPART (70-30 MIX) 100 UNIT/ML ~~LOC~~ SUSP: 35.0000 [IU] | Freq: Every day | SUBCUTANEOUS | Status: DC

## 2016-12-22 MED ORDER — ASPIRIN 81 MG PO TABS: 81.0000 mg | ORAL_TABLET | Freq: Every day | ORAL | 0 refills | Status: AC

## 2016-12-22 MED ORDER — INSULIN ASPART PROT & ASPART (70-30 MIX) 100 UNIT/ML ~~LOC~~ SUSP: 30.0000 [IU] | Freq: Every day | SUBCUTANEOUS | Status: DC

## 2016-12-22 MED ORDER — LISINOPRIL 40 MG PO TABS: 40.0000 mg | ORAL_TABLET | Freq: Every day | ORAL | Status: DC

## 2016-12-22 MED ORDER — INSULIN ASPART PROT & ASPART (70-30 MIX) 100 UNIT/ML ~~LOC~~ SUSP: 30.0000 [IU] | Freq: Two times a day (BID) | SUBCUTANEOUS | 11 refills | Status: DC

## 2016-12-22 MED ORDER — PROSIGHT PO TABS
1.0000 | ORAL_TABLET | Freq: Every day | ORAL | 0 refills | Status: DC
Start: 2016-12-22 — End: 2017-03-14

## 2016-12-22 MED ORDER — INSULIN ASPART PROT & ASPART (70-30 MIX) 100 UNIT/ML ~~LOC~~ SUSP
SUBCUTANEOUS | 11 refills | Status: DC
Start: 2016-12-22 — End: 2016-12-22

## 2016-12-22 MED ORDER — CHLORTHALIDONE 25 MG PO TABS: 25.0000 mg | ORAL_TABLET | Freq: Every day | ORAL | Status: DC

## 2016-12-22 MED ORDER — PREMIER PROTEIN SHAKE: 11.0000 [oz_av] | Freq: Two times a day (BID) | ORAL | 0 refills | Status: DC

## 2016-12-22 NOTE — Clinical Social Work Placement (Signed)
   CLINICAL SOCIAL WORK PLACEMENT  NOTE  Date:  12/22/2016  Patient Details  Name: Mike [name] MRN: [id number] Date of Birth: [birth year]  Clinical Social Work is seeking post-discharge placement for this patient at the Skilled  Nursing Facility level of care (*CSW will initial, date and re-position this form in  chart as items are completed):  Yes   Patient/family provided with [facility] Clinical Social Work Department's list of facilities offering this level of care within the geographic area requested by the patient (or if unable, by the patient's family).  Yes   Patient/family informed of their freedom to choose among providers that offer the needed level of care, that participate in Medicare, Medicaid or managed care program needed by the patient, have an available bed and are willing to accept the patient.  Yes   Patient/family informed of [facility]'s ownership interest in [facility] and [facility], as well as of the fact that they are under no obligation to receive care at these facilities.  PASRR submitted to EDS on       PASRR number received on 12/19/16     Existing PASRR number confirmed on       FL2 transmitted to all facilities in geographic area requested by pt/family on 12/19/16     FL2 transmitted to all facilities within larger geographic area on       Patient informed that his/her managed care company has contracts with or will negotiate with certain facilities, including the following:        Yes   Patient/family informed of bed offers received.  Patient chooses bed at [facility]     Physician recommends and patient chooses bed at      Patient to be transferred to [facility] on 12/22/16.  [facility]     Patient family notified on 12/22/16 of transfer.  Name of family member notified:  pt responsible for self     PHYSICIAN       Additional  Comment:    _______________________________________________ Tresa MoorePatricia V Lurleen Soltero, [name] 12/22/2016, 11:12 AM

## 2016-12-22 NOTE — Progress Notes (Signed)
[facility]. Patient was made aware of transport and will be transported via [facility] at this time. Patient left unit via wheelchair in stable condition.

## 2016-12-22 NOTE — Discharge Summary (Addendum)
Physician Discharge Summary   Patient ID: Mike [name] MRN: [id number] DOB/AGE: 07-25-1960 56 y.o.  Admit date: 11/26/2016 Discharge date: 12/22/2016  Primary Care Physician:  [name], NP  Discharge Diagnoses:    . Uncontrolled type 2 DM with hyperosmolar nonketotic hyperglycemia ([place]) . Hypertensive urgency . Normocytic anemia . Noncompliance . Protein-calorie malnutrition, severe . HLD (hyperlipidemia)   Consults: Psychiatry Diabetic coordinator  Recommendations for Outpatient Follow-up:  1. Please repeat CBC/BMET at next visit 2. Obtain hemoglobin A1c in 1 month to follow up   DIET: Carb modified diet    Allergies:   Allergies  Allergen Reactions  . Ibuprofen Nausea And Vomiting and Other (See Comments)    Reaction:  Bloating   . Tylenol [Acetaminophen] Nausea And Vomiting and Other (See Comments)    Reaction:  Bloating      DISCHARGE MEDICATIONS: Allergies as of 12/22/2016      Reactions   Ibuprofen Nausea And Vomiting, Other (See Comments)   Reaction:  Bloating    Tylenol [acetaminophen] Nausea And Vomiting, Other (See Comments)   Reaction:  Bloating       Medication List    STOP taking these medications   glyBURIDE 5 MG tablet Commonly known as:  DIABETA     TAKE these medications   aspirin 81 MG tablet Take 1 tablet (81 mg total) by mouth daily.   atorvastatin 40 MG tablet Commonly known as:  LIPITOR Take 1 tablet (40 mg total) daily by mouth.   blood glucose meter kit and supplies Kit Dispense based on patient and insurance preference. Use up to four times daily as directed. (FOR ICD-9 250.00, 250.01).   chlorthalidone 25 MG tablet Commonly known as:  HYGROTON Take 1 tablet (25 mg total) by mouth daily.   ferrous sulfate 325 (65 FE) MG tablet Take 1 tablet (325 mg total) daily by mouth.   insulin aspart protamine- aspart (70-30) 100 UNIT/ML injection Commonly known as:  NOVOLOG MIX 70/30 Inject 0.3 mLs (30 Units  total) into the skin 2 (two) times daily with a meal. What changed:  how much to take   lisinopril 40 MG tablet Commonly known as:  PRINIVIL,ZESTRIL Take 1 tablet (40 mg total) by mouth daily.   multivitamin Tabs tablet Take 1 tablet by mouth daily.   protein supplement shake Liqd Commonly known as:  PREMIER PROTEIN Take 325 mLs (11 oz total) by mouth 2 (two) times daily between meals.            Durable Medical Equipment  (From admission, onward)        Start     Ordered   11/28/16 1125  For home use only DME Cane  Once    Comments:  Single point cane   11/28/16 1125       Brief H and P: For complete details please refer to admission H and P, but in brief56yom PMH DM, homelessness 23 ED visits in 6 months, 8 admissions in 6 months, presented with malaise, found to have hyperglycemia, SBP 200s, disheveled. Admitted for hyperosmolar hyperglycemic state, treated with IV insulin and transitioned to subq insulin. Hyperglycemia has been recurrent secondary to non-compliance with diet. Seen by psychiatry and felt to not have capacity for self-care of DM. Patient has had 23 ED visits and 8 hospitalizations in 6 months. CM/CSW exploring guardianship. Patient would be best served with controlled setting long term for care of DM.  Hospital Course:  Uncontrolled type 2 diabetes mellitus with hyperosmolar nonketotic  hyperglycemia (HCC) -Hemoglobin A1c 13.0, presented with hyperosmolar hyperglycemia on admission, noncompliant -8 hospitalizations in 6 months, was seen by psychiatry, does not have capacity to manage treatment of diabetes -Continue 70/30 insulin 30 units twice a day with breakfast and supper, adjust insulin regimen -Glyburide discontinued due to risk of hypoglycemia    Homelessness/social issues -Patient will be better served in long-term settings for uncontrolled diabetes, unsafe to be homeless with insulin and noncompliance.  With assistance of social worker, placed in  skilled nursing facility    Protein-calorie malnutrition, severe -Continue nutritional supplements    Essential hypertension -Presented with hypertensive urgency on admission, currently stable -Continue chlorthalidone, lisinopril    Normocytic anemia -Currently H&H stable    HLD (hyperlipidemia) -Continue statin  Severe noncompliance -Patient counseled to be compliant with his medical treatment     Day of Discharge BP (!) 146/90   Pulse 77   Temp 98.6 F (37 C) (Oral)   Resp 20   Ht '6\' 4"'$  (1.93 m)   Wt 74.5 kg (164 lb 3.9 oz)   SpO2 100%   BMI 19.99 kg/m   Physical Exam: General: Alert and awake oriented, not in any acute distress. HEENT: anicteric sclera, pupils reactive to light and accommodation CVS: S1-S2 clear no murmur rubs or gallops Chest: clear to auscultation bilaterally, no wheezing rales or rhonchi Abdomen: soft nontender, nondistended, normal bowel sounds Extremities: no cyanosis, clubbing or edema noted bilaterally Neuro: Cranial nerves II-XII intact, no focal neurological deficits   The results of significant diagnostics from this hospitalization (including imaging, microbiology, ancillary and laboratory) are listed below for reference.    LAB RESULTS: Basic Metabolic Panel: Recent Labs  Lab 12/16/16 0451 12/17/16 0447  NA 139  --   K 4.1  --   CL 103  --   CO2 29  --   GLUCOSE 120*  --   BUN 17  --   CREATININE 0.88 0.92  CALCIUM 9.7  --   MG 2.1  --   PHOS 4.1  --    Liver Function Tests: Recent Labs  Lab 12/16/16 0451  AST 32  ALT 52  ALKPHOS 87  BILITOT 0.7  PROT 6.9  ALBUMIN 3.6   No results for input(s): LIPASE, AMYLASE in the last 168 hours. No results for input(s): AMMONIA in the last 168 hours. CBC: Recent Labs  Lab 12/16/16 0451  WBC 6.0  NEUTROABS 2.7  HGB 9.7*  HCT 30.4*  MCV 82.6  PLT 334   Cardiac Enzymes: No results for input(s): CKTOTAL, CKMB, CKMBINDEX, TROPONINI in the last 168  hours. BNP: Invalid input(s): POCBNP CBG: Recent Labs  Lab 12/22/16 0110 12/22/16 0744  GLUCAP 195* 248*    Significant Diagnostic Studies:  No results found.  2D ECHO:   Disposition and Follow-up: Discharge Instructions    Diet Carb Modified   Complete by:  As directed    Discharge instructions   Complete by:  As directed    It is VERY IMPORTANT that you follow up with a PCP on a regular basis.  Check your blood glucoses before each meal and at bedtime and maintain a log of your readings.  Bring this log with you when you follow up with your PCP so that he or she can adjust your insulin at your follow up visit.   Increase activity slowly   Complete by:  As directed        DISPOSITION: Skilled nursing facility   DISCHARGE FOLLOW-UP Follow-up Information  [name], Audrea Muscat, NP. Schedule an appointment as soon as possible for a visit in 2 week(s).   Contact information: [place] [phone]            Time spent on Discharge: 21mns   Signed:   REstill CottaM.D. [facility] 12/22/2016, 10:16 AM Pager: [phone]

## 2016-12-22 NOTE — Social Work (Signed)
CSW spoke with pt at bedside, pt is aware and still willing to go [facility] today. CSW informed pt that Keene BreathPatricia Pencil, CSW would also stop by and support pt discharge.   CSW available for any further dc needs.  Doy HutchingIsabel H Jule Schlabach, LCSWA [facility] Clinical Social Work [phone]

## 2016-12-22 NOTE — Social Work (Addendum)
Clinical Social Worker facilitated patient discharge including contacting patient family and facility to confirm patient discharge plans.  Clinical information faxed to Debra at facility and pt agreeable with plan.    CSW arranged [facility], [address], [place], [place] [address].    RN to call [phone] to give report prior to discharge.  Clinical Social Worker will sign off for now as social work intervention is no longer needed. Please consult us again if new need arises.  Keene BreathPatricia Hamsini Verrilli, [name] Clinical Social Worker [phone]

## 2017-01-25 ENCOUNTER — Encounter (HOSPITAL_COMMUNITY): Payer: Self-pay

## 2017-01-25 DIAGNOSIS — F17210 Nicotine dependence, cigarettes, uncomplicated: Secondary | ICD-10-CM | POA: Insufficient documentation

## 2017-01-25 DIAGNOSIS — E1165 Type 2 diabetes mellitus with hyperglycemia: Secondary | ICD-10-CM | POA: Insufficient documentation

## 2017-01-25 DIAGNOSIS — Z794 Long term (current) use of insulin: Secondary | ICD-10-CM | POA: Insufficient documentation

## 2017-01-25 DIAGNOSIS — Z590 Homelessness: Secondary | ICD-10-CM | POA: Insufficient documentation

## 2017-01-25 DIAGNOSIS — Z79899 Other long term (current) drug therapy: Secondary | ICD-10-CM | POA: Insufficient documentation

## 2017-01-25 DIAGNOSIS — I10 Essential (primary) hypertension: Secondary | ICD-10-CM | POA: Insufficient documentation

## 2017-01-25 LAB — CBG MONITORING, ED: Glucose-Capillary: 388 mg/dL — ABNORMAL HIGH (ref 65–99)

## 2017-01-25 LAB — CBC
HEMATOCRIT: 33 % — AB (ref 39.0–52.0)
HEMOGLOBIN: 10.4 g/dL — AB (ref 13.0–17.0)
MCH: 24.9 pg — ABNORMAL LOW (ref 26.0–34.0)
MCHC: 31.5 g/dL (ref 30.0–36.0)
MCV: 79.1 fL (ref 78.0–100.0)
Platelets: 275 10*3/uL (ref 150–400)
RBC: 4.17 MIL/uL — AB (ref 4.22–5.81)
RDW: 12.3 % (ref 11.5–15.5)
WBC: 9.5 10*3/uL (ref 4.0–10.5)

## 2017-01-25 NOTE — ED Triage Notes (Signed)
Pt called EMS for high blood pressure and elevated blood sugar

## 2017-01-26 ENCOUNTER — Emergency Department (HOSPITAL_COMMUNITY)
Admission: EM | Admit: 2017-01-26 | Discharge: 2017-01-26 | Disposition: A | Discharge status: Home / Self Care | Payer: Self-pay | Attending: Emergency Medicine | Admitting: Emergency Medicine

## 2017-01-26 DIAGNOSIS — R739 Hyperglycemia, unspecified: Secondary | ICD-10-CM

## 2017-01-26 LAB — BASIC METABOLIC PANEL
ANION GAP: 13 (ref 5–15)
BUN: 25 mg/dL — ABNORMAL HIGH (ref 6–20)
CALCIUM: 9.5 mg/dL (ref 8.9–10.3)
CO2: 26 mmol/L (ref 22–32)
Chloride: 96 mmol/L — ABNORMAL LOW (ref 101–111)
Creatinine, Ser: 1.08 mg/dL (ref 0.61–1.24)
GLUCOSE: 393 mg/dL — AB (ref 65–99)
POTASSIUM: 3.9 mmol/L (ref 3.5–5.1)
SODIUM: 135 mmol/L (ref 135–145)

## 2017-01-26 LAB — CBG MONITORING, ED
GLUCOSE-CAPILLARY: 318 mg/dL — AB (ref 65–99)
Glucose-Capillary: 319 mg/dL — ABNORMAL HIGH (ref 65–99)

## 2017-01-26 MED ORDER — CHLORTHALIDONE 25 MG PO TABS: 25.0000 mg | ORAL_TABLET | Freq: Every day | ORAL | 0 refills | Status: DC

## 2017-01-26 MED ORDER — INSULIN ASPART PROT & ASPART (70-30 MIX) 100 UNIT/ML ~~LOC~~ SUSP
30.0000 [IU] | Freq: Once | SUBCUTANEOUS | Status: AC
Start: 2017-01-26 — End: 2017-01-26
  Administered 2017-01-26: 30 [IU] via SUBCUTANEOUS
  Filled 2017-01-26: qty 10

## 2017-01-26 MED ORDER — LISINOPRIL 40 MG PO TABS: 40.0000 mg | ORAL_TABLET | Freq: Every day | ORAL | 0 refills | Status: DC

## 2017-01-26 MED ORDER — ATORVASTATIN CALCIUM 40 MG PO TABS: 40.0000 mg | ORAL_TABLET | Freq: Every day | ORAL | 0 refills | Status: DC

## 2017-01-26 MED ORDER — INSULIN ASPART PROT & ASPART (70-30 MIX) 100 UNIT/ML ~~LOC~~ SUSP: 30.0000 [IU] | Freq: Two times a day (BID) | SUBCUTANEOUS | 11 refills | Status: DC

## 2017-01-26 NOTE — Care Management Note (Addendum)
Case Management Note  CM noted pts return to the ED and as doing chart was approached by Dr. Eudelia Bunchardama asking where the pt gets his insulin from.  CM advised that pt can get his insulin and other medications from [facility].  Per Dr. Eudelia Bunchardama the plan is to give insulin today and D/C back to the community.    CM contacted [name], NP to advise that pt was out of SNF.  She stated that she had already seen him.  [facility] and was asked to wait.  She reports, he said she needed to hurry or he was going to the ED and then left before she could see him.  She stated she would be happy to see him on Monday.  No further CM needs noted at this time.

## 2017-01-26 NOTE — Progress Notes (Signed)
UPDATE: CSW informed Mike [name] with [facility] is no longer working with patient. [facility] has closed patients case.   [name] with 30 day LOG- discharged from facility on 01/22/17. CSW reached out to NauruDebbie with [facility] to determine if patient had received Medicaid while at facility- Medicaid still pending at this time.   Per notes, patient had [facility]PS worker, Mike [name] [phone], that was assisting patient with Medicaid and long term placement. CSW unsure if APS worker is currently active with patient- voicemail left for Ms. [name] to return call.   Stacy GardnerErin Dynasia Kercheval, [facility] Clinical Social Worker [phone]

## 2017-01-26 NOTE — ED Provider Notes (Signed)
[facility] DEPT Provider Note  CSN: [id number] Arrival date & time: 01/25/17 2310  Chief Complaint(s) Hyperglycemia  HPI Mike [name] is a 57 y.o. male with an extensive past medical history including homelessness, poorly controlled diabetes due to his inability to do so resulting in frequent visits to the emergency department and admissions for DKA.  Patient was recently admitted several months ago for approximately 1 month and then discharged to a skilled nursing facility from which she was recently discharged.  Came in for elevated blood sugar levels.  He states that he has not had any insulin in the past 2 days.  Denies any chest pain, shortness of breath, fevers, chills, nausea, vomiting, abdominal pain.  He denies any other physical complaints other than being hungry.  HPI    Past Medical History Past Medical History:  Diagnosis Date  . Anxiety   . Anxiety   . Chronic lower back pain   . Depression   . DKA (diabetic ketoacidoses) ([place]) 07/30/2016  . Hyperlipemia   . Hypertension   . Migraine    "last one was ~ 4 yr ago" (12/23/2014)  . Seizures ([place])    "related to pills for anxiety; if I don't take the pills I'm suppose to take I'll have them" (12/23/2014)  . Type II diabetes mellitus [facility])    Patient Active Problem List   Diagnosis Date Noted  . Hyperphosphatemia 12/13/2016  . Evaluation by psychiatric service required 11/30/2016  . Lactic acidosis 11/18/2016  . Normocytic anemia 11/06/2016  . HLD (hyperlipidemia) 11/06/2016  . Adjustment disorder with other symptoms 11/05/2016  . Uncontrolled type 2 diabetes mellitus with hyperosmolar nonketotic hyperglycemia ([place]) 08/24/2016  . Essential hypertension 08/24/2016  . Acute ischemic stroke ([place])   . Protein-calorie malnutrition, severe 12/24/2014  . Homelessness 12/23/2014  . DM (diabetes mellitus) ([place]) 12/23/2014  . Intermittent palpitations 12/23/2014  . Tobacco abuse 12/23/2014    . Pleuritic chest pain 12/23/2014  . Abnormal EKG 12/23/2014  . Type II diabetes mellitus with renal manifestations ([place])    Home Medication(s) Prior to Admission medications   Medication Sig Start Date End Date Taking? Authorizing Provider  blood glucose meter kit and supplies KIT Dispense based on patient and insurance preference. Use up to four times daily as directed. (FOR ICD-9 250.00, 250.01). 11/22/16  Yes Velvet Bathe, MD  ferrous sulfate 325 (65 FE) MG tablet Take 1 tablet (325 mg total) daily by mouth. 11/23/16  Yes Ward, Delice Bison, DO  multivitamin (PROSIGHT) TABS tablet Take 1 tablet by mouth daily. 12/22/16  Yes Rai, Ripudeep K, MD  atorvastatin (LIPITOR) 40 MG tablet Take 1 tablet (40 mg total) by mouth daily. 01/26/17   Fatima Blank, MD  chlorthalidone (HYGROTON) 25 MG tablet Take 1 tablet (25 mg total) by mouth daily. 01/26/17   Cardama, Grayce Sessions, MD  insulin aspart protamine- aspart (NOVOLOG MIX 70/30) (70-30) 100 UNIT/ML injection Inject 0.3 mLs (30 Units total) into the skin 2 (two) times daily with a meal. 01/26/17   Cardama, Grayce Sessions, MD  lisinopril (PRINIVIL,ZESTRIL) 40 MG tablet Take 1 tablet (40 mg total) by mouth daily. 01/26/17   Cardama, Grayce Sessions, MD  protein supplement shake (PREMIER PROTEIN) LIQD Take 325 mLs (11 oz total) by mouth 2 (two) times daily between meals. Patient not taking: Reported on 01/26/2017 12/22/16   Mendel Corning, MD  Past Surgical History Past Surgical History:  Procedure Laterality Date  . NO PAST SURGERIES     Family History Family History  Problem Relation Age of Onset  . Diabetes Mellitus II Mother   . Diabetes Mellitus II Father     Social History Social History   Tobacco Use  . Smoking status: Current Every Day Smoker    Packs/day: 0.10    Years: 40.00    Pack years: 4.00     Types: Cigarettes  . Smokeless tobacco: Never Used  Substance Use Topics  . Alcohol use: Yes    Alcohol/week: 1.2 oz    Types: 2 Cans of beer per week  . Drug use: No    Comment: 12/23/2014 "stopped ~ 10 yrs ago"   Allergies Ibuprofen and Tylenol [acetaminophen]  Review of Systems Review of Systems All other systems are reviewed and are negative for acute change except as noted in the HPI  Physical Exam Vital Signs  I have reviewed the triage vital signs BP (!) 155/79 (BP Location: Left Arm)   Pulse 89   Temp 98.9 F (37.2 C) (Oral)   Resp 20   SpO2 96%   Physical Exam  Constitutional: He is oriented to person, place, and time. He appears well-developed and well-nourished. No distress.  HENT:  Head: Normocephalic and atraumatic.  Right Ear: External ear normal.  Left Ear: External ear normal.  Nose: Nose normal.  Mouth/Throat: Mucous membranes are normal. No trismus in the jaw.  Eyes: Conjunctivae and EOM are normal. No scleral icterus.  Neck: Normal range of motion and phonation normal.  Cardiovascular: Normal rate and regular rhythm.  Pulmonary/Chest: Effort normal. No stridor. No respiratory distress.  Abdominal: He exhibits no distension.  Musculoskeletal: Normal range of motion. He exhibits no edema.  Neurological: He is alert and oriented to person, place, and time.  Skin: He is not diaphoretic.  Psychiatric: He has a normal mood and affect. His behavior is normal.  Vitals reviewed.   ED Results and Treatments Labs (all labs ordered are listed, but only abnormal results are displayed) Labs Reviewed  BASIC METABOLIC PANEL - Abnormal; Notable for the following components:      Result Value   Chloride 96 (*)    Glucose, Bld 393 (*)    BUN 25 (*)    All other components within normal limits  CBC - Abnormal; Notable for the following components:   RBC 4.17 (*)    Hemoglobin 10.4 (*)    HCT 33.0 (*)    MCH 24.9 (*)    All other components within normal  limits  CBG MONITORING, ED - Abnormal; Notable for the following components:   Glucose-Capillary 388 (*)    All other components within normal limits  CBG MONITORING, ED - Abnormal; Notable for the following components:   Glucose-Capillary 318 (*)    All other components within normal limits  EKG  EKG Interpretation  Date/Time:    Ventricular Rate:    PR Interval:    QRS Duration:   QT Interval:    QTC Calculation:   R Axis:     Text Interpretation:        Radiology No results found. Pertinent labs & imaging results that were available during my care of the patient were reviewed by me and considered in my medical decision making (see chart for details).  Medications Ordered in ED Medications  insulin aspart protamine- aspart (NOVOLOG MIX 70/30) injection 30 Units (not administered)                                                                                                                                    Procedures Procedures  (including critical care time)  Medical Decision Making / ED Course I have reviewed the nursing notes for this encounter and the patient's prior records (if available in EHR or on provided paperwork).    Hyperglycemia without evidence of DKA or HHS.  Patient provided with oral hydration.  SubQ insulin.   Provided with prescriptions for his home medications.   The patient appears reasonably screened and/or stabilized for discharge and I doubt any other medical condition or other [facility] requiring further screening, evaluation, or treatment in the ED at this time prior to discharge.  The patient is safe for discharge with strict return precautions.   Final Clinical Impression(s) / ED Diagnoses Final diagnoses:  Hyperglycemia   Disposition: Discharge  Condition: Good  I have discussed the results, Dx and Tx plan with the  patient who expressed understanding and agree(s) with the plan. Discharge instructions discussed at great length. The patient was given strict return precautions who verbalized understanding of the instructions. No further questions at time of discharge.    ED Discharge Orders        Ordered    insulin aspart protamine- aspart (NOVOLOG MIX 70/30) (70-30) 100 UNIT/ML injection  2 times daily with meals     01/26/17 1211    lisinopril (PRINIVIL,ZESTRIL) 40 MG tablet  Daily     01/26/17 1211    atorvastatin (LIPITOR) 40 MG tablet  Daily     01/26/17 1211    chlorthalidone (HYGROTON) 25 MG tablet  Daily     01/26/17 1211       Follow Up: [name], NP [address]  [place] [address] [phone]  Follow up       This chart was dictated using voice recognition software.  Despite best efforts to proofread,  errors can occur which can change the documentation meaning.   Fatima Blank, MD 01/26/17 1216

## 2017-01-30 ENCOUNTER — Other Ambulatory Visit: Payer: Self-pay

## 2017-01-30 ENCOUNTER — Emergency Department (HOSPITAL_COMMUNITY)
Admission: EM | Admit: 2017-01-30 | Discharge: 2017-01-31 | Disposition: A | Discharge status: Home / Self Care | Payer: Self-pay | Attending: Emergency Medicine | Admitting: Emergency Medicine

## 2017-01-30 ENCOUNTER — Encounter (HOSPITAL_COMMUNITY): Payer: Self-pay | Admitting: Emergency Medicine

## 2017-01-30 DIAGNOSIS — R739 Hyperglycemia, unspecified: Secondary | ICD-10-CM

## 2017-01-30 DIAGNOSIS — Z79899 Other long term (current) drug therapy: Secondary | ICD-10-CM | POA: Insufficient documentation

## 2017-01-30 DIAGNOSIS — F17210 Nicotine dependence, cigarettes, uncomplicated: Secondary | ICD-10-CM | POA: Insufficient documentation

## 2017-01-30 DIAGNOSIS — I10 Essential (primary) hypertension: Secondary | ICD-10-CM | POA: Insufficient documentation

## 2017-01-30 DIAGNOSIS — Z794 Long term (current) use of insulin: Secondary | ICD-10-CM | POA: Insufficient documentation

## 2017-01-30 DIAGNOSIS — E1165 Type 2 diabetes mellitus with hyperglycemia: Secondary | ICD-10-CM | POA: Insufficient documentation

## 2017-01-30 LAB — URINALYSIS, ROUTINE W REFLEX MICROSCOPIC
BACTERIA UA: NONE SEEN
BILIRUBIN URINE: NEGATIVE
KETONES UR: 5 mg/dL — AB
LEUKOCYTES UA: NEGATIVE
Nitrite: NEGATIVE
PH: 5 (ref 5.0–8.0)
Protein, ur: NEGATIVE mg/dL
Specific Gravity, Urine: 1.028 (ref 1.005–1.030)

## 2017-01-30 LAB — BASIC METABOLIC PANEL
Anion gap: 13 (ref 5–15)
BUN: 19 mg/dL (ref 6–20)
CO2: 25 mmol/L (ref 22–32)
Calcium: 9.6 mg/dL (ref 8.9–10.3)
Chloride: 93 mmol/L — ABNORMAL LOW (ref 101–111)
Creatinine, Ser: 1.3 mg/dL — ABNORMAL HIGH (ref 0.61–1.24)
GFR calc Af Amer: >60 mL/min (ref >60)
GFR calc non Af Amer: >60 mL/min (ref >60)
Glucose, Bld: 479 mg/dL — ABNORMAL HIGH (ref 65–99)
POTASSIUM: 4.2 mmol/L (ref 3.5–5.1)
SODIUM: 131 mmol/L — AB (ref 135–145)

## 2017-01-30 LAB — CBC
HEMATOCRIT: 33.3 % — AB (ref 39.0–52.0)
Hemoglobin: 10.5 g/dL — ABNORMAL LOW (ref 13.0–17.0)
MCH: 24.7 pg — ABNORMAL LOW (ref 26.0–34.0)
MCHC: 31.5 g/dL (ref 30.0–36.0)
MCV: 78.4 fL (ref 78.0–100.0)
PLATELETS: 305 10*3/uL (ref 150–400)
RBC: 4.25 MIL/uL (ref 4.22–5.81)
RDW: 12.3 % (ref 11.5–15.5)
WBC: 7.5 10*3/uL (ref 4.0–10.5)

## 2017-01-30 LAB — CBG MONITORING, ED
Glucose-Capillary: 331 mg/dL — ABNORMAL HIGH (ref 65–99)
Glucose-Capillary: 506 mg/dL (ref 65–99)

## 2017-01-30 MED ORDER — INSULIN ASPART PROT & ASPART (70-30 MIX) 100 UNIT/ML ~~LOC~~ SUSP: 30.0000 [IU] | Freq: Two times a day (BID) | SUBCUTANEOUS | 11 refills | Status: DC

## 2017-01-30 MED ORDER — SODIUM CHLORIDE 0.9 % IV BOLUS (SEPSIS)
1000.0000 mL | Freq: Once | INTRAVENOUS | Status: AC
  Administered 2017-01-30: 1000 mL via INTRAVENOUS

## 2017-01-30 MED ORDER — INSULIN ASPART 100 UNIT/ML ~~LOC~~ SOLN
10.0000 [IU] | Freq: Once | SUBCUTANEOUS | Status: AC
  Administered 2017-01-30: 10 [IU] via SUBCUTANEOUS
  Filled 2017-01-30: qty 1

## 2017-01-30 NOTE — ED Triage Notes (Signed)
Pt. Stated, My sugar is high. Mike [name] Atlasve been out of Insulin for 4 weeks.

## 2017-01-30 NOTE — ED Provider Notes (Signed)
[place] EMERGENCY DEPARTMENT Provider Note   CSN: [id number] Arrival date & time: 01/30/17  1811     History   Chief Complaint Chief Complaint  Patient presents with  . Hyperglycemia    HPI Mike [name] is a 57 y.o. male.  HPI Patient has a history of diabetes that is poorly controlled.  Patient is homeless and frequently has difficulty accessing his medications.  Patient states he last took his insulin a few days ago.  He was recently in a skilled nursing facility and was then discharged.  Patient was back in the emergency room on January 10 for hyperglycemia.  He was given insulin and prescriptions.  Patient returns because his blood sugar is elevated.  He denies any vomiting or diarrhea.  No abdominal pain.  No fevers or chills.  He has not taken any insulin in a few days. Past Medical History:  Diagnosis Date  . Anxiety   . Anxiety   . Chronic lower back pain   . Depression   . DKA (diabetic ketoacidoses) ([place]) 07/30/2016  . Hyperlipemia   . Hypertension   . Migraine    "last one was ~ 4 yr ago" (12/23/2014)  . Seizures ([place])    "related to pills for anxiety; if I don't take the pills I'm suppose to take I'll have them" (12/23/2014)  . Type II diabetes mellitus [facility])     Patient Active Problem List   Diagnosis Date Noted  . Hyperphosphatemia 12/13/2016  . Evaluation by psychiatric service required 11/30/2016  . Lactic acidosis 11/18/2016  . Normocytic anemia 11/06/2016  . HLD (hyperlipidemia) 11/06/2016  . Adjustment disorder with other symptoms 11/05/2016  . Uncontrolled type 2 diabetes mellitus with hyperosmolar nonketotic hyperglycemia ([place]) 08/24/2016  . Essential hypertension 08/24/2016  . Acute ischemic stroke ([place])   . Protein-calorie malnutrition, severe 12/24/2014  . Homelessness 12/23/2014  . DM (diabetes mellitus) ([place]) 12/23/2014  . Intermittent palpitations 12/23/2014  . Tobacco abuse 12/23/2014  . Pleuritic chest pain  12/23/2014  . Abnormal EKG 12/23/2014  . Type II diabetes mellitus with renal manifestations [facility])     Past Surgical History:  Procedure Laterality Date  . NO PAST SURGERIES         Home Medications    Prior to Admission medications   Medication Sig Start Date End Date Taking? Authorizing Provider  acetaminophen (TYLENOL) 500 MG tablet Take 500-1,000 mg by mouth every 6 (six) hours as needed (for pain or headaches).   Yes [provider]  ferrous sulfate 325 (65 FE) MG tablet Take 1 tablet (325 mg total) daily by mouth. 11/23/16  Yes Ward, Cyril Mourning N, DO  insulin aspart protamine- aspart (NOVOLOG MIX 70/30) (70-30) 100 UNIT/ML injection Inject 0.3 mLs (30 Units total) into the skin 2 (two) times daily with a meal. 01/26/17  Yes Cardama, Grayce Sessions, MD  atorvastatin (LIPITOR) 40 MG tablet Take 1 tablet (40 mg total) by mouth daily. 01/26/17   Cardama, Grayce Sessions, MD  blood glucose meter kit and supplies KIT Dispense based on patient and insurance preference. Use up to four times daily as directed. (FOR ICD-9 250.00, 250.01). 11/22/16   Velvet Bathe, MD  chlorthalidone (HYGROTON) 25 MG tablet Take 1 tablet (25 mg total) by mouth daily. 01/26/17   Fatima Blank, MD  lisinopril (PRINIVIL,ZESTRIL) 40 MG tablet Take 1 tablet (40 mg total) by mouth daily. 01/26/17   CardamaGrayce Sessions, MD  multivitamin (PROSIGHT) TABS tablet Take 1 tablet by mouth  daily. Patient not taking: Reported on 01/30/2017 12/22/16   Rai, Vernelle Emerald, MD  protein supplement shake (PREMIER PROTEIN) LIQD Take 325 mLs (11 oz total) by mouth 2 (two) times daily between meals. Patient not taking: Reported on 01/30/2017 12/22/16   Mendel Corning, MD    Family History Family History  Problem Relation Age of Onset  . Diabetes Mellitus II Mother   . Diabetes Mellitus II Father     Social History Social History   Tobacco Use  . Smoking status: Current Every Day Smoker    Packs/day: 0.10    Years:  40.00    Pack years: 4.00    Types: Cigarettes  . Smokeless tobacco: Never Used  Substance Use Topics  . Alcohol use: Yes    Alcohol/week: 1.2 oz    Types: 2 Cans of beer per week  . Drug use: No    Comment: 12/23/2014 "stopped ~ 10 yrs ago"     Allergies   Ibuprofen and Tylenol [acetaminophen]   Review of Systems Review of Systems  All other systems reviewed and are negative.    Physical Exam Updated Vital Signs BP (!) 198/82 (BP Location: Right Arm)   Pulse 61   Temp 98.9 F (37.2 C) (Oral)   Resp 18   Ht 1.93 m ('6\' 4"'$ )   Wt 77.1 kg (170 lb)   SpO2 100%   BMI 20.69 kg/m   Physical Exam  Constitutional: No distress.  disheveled  HENT:  Head: Normocephalic and atraumatic.  Right Ear: External ear normal.  Left Ear: External ear normal.  Eyes: Conjunctivae are normal. Right eye exhibits no discharge. Left eye exhibits no discharge. No scleral icterus.  Neck: Neck supple. No tracheal deviation present.  Cardiovascular: Normal rate, regular rhythm and intact distal pulses.  Pulmonary/Chest: Effort normal and breath sounds normal. No stridor. No respiratory distress. He has no wheezes. He has no rales.  Abdominal: Soft. Bowel sounds are normal. He exhibits no distension. There is no tenderness. There is no rebound and no guarding.  Musculoskeletal: He exhibits no edema or tenderness.  Neurological: He is alert. He has normal strength. No cranial nerve deficit (no facial droop, extraocular movements intact, no slurred speech) or sensory deficit. He exhibits normal muscle tone. He displays no seizure activity. Coordination normal.  Skin: Skin is warm and dry. No rash noted. He is not diaphoretic.  Psychiatric: He has a normal mood and affect.  Nursing note and vitals reviewed.    ED Treatments / Results  Labs (all labs ordered are listed, but only abnormal results are displayed) Labs Reviewed  BASIC METABOLIC PANEL - Abnormal; Notable for the following components:       Result Value   Sodium 131 (*)    Chloride 93 (*)    Glucose, Bld 479 (*)    Creatinine, Ser 1.30 (*)    All other components within normal limits  CBC - Abnormal; Notable for the following components:   Hemoglobin 10.5 (*)    HCT 33.3 (*)    MCH 24.7 (*)    All other components within normal limits  URINALYSIS, ROUTINE W REFLEX MICROSCOPIC - Abnormal; Notable for the following components:   Color, Urine STRAW (*)    Glucose, UA >=500 (*)    Hgb urine dipstick SMALL (*)    Ketones, ur 5 (*)    Squamous Epithelial / LPF 0-5 (*)    All other components within normal limits  CBG MONITORING, ED - Abnormal;  Notable for the following components:   Glucose-Capillary 506 (*)    All other components within normal limits  CBG MONITORING, ED - Abnormal; Notable for the following components:   Glucose-Capillary 331 (*)    All other components within normal limits    Radiology No results found.  Procedures Procedures (including critical care time)  Medications Ordered in ED Medications  insulin aspart (novoLOG) injection 10 Units (10 Units Subcutaneous Given 01/30/17 2252)  sodium chloride 0.9 % bolus 1,000 mL (1,000 mLs Intravenous New Bag/Given 01/30/17 2251)     Initial Impression / Assessment and Plan / ED Course  I have reviewed the triage vital signs and the nursing notes.  Pertinent labs & imaging results that were available during my care of the patient were reviewed by me and considered in my medical decision making (see chart for details).   Patient presented to the emergency room for hyperglycemia.  Electrolytes do not show signs of diabetic ketoacidosis.  Patient appears comfortable in no distress.  He was given IV fluids and a dose of insulin.  He appears stable to continue his outpatient regimen.  He was seen in the emergency room on January 11 and was given prescriptions at that time.  Final Clinical Impressions(s) / ED Diagnoses   Final diagnoses:  Hyperglycemia     ED Discharge Orders    None       Dorie Rank, MD 01/30/17 2343

## 2017-01-30 NOTE — Discharge Instructions (Signed)
Continue your insulin, follow up with your primary care doctor

## 2017-01-31 NOTE — ED Notes (Signed)
PT states understanding of care given, follow up care, and medication prescribed. PT ambulated from ED to car with a steady gait. 

## 2017-02-07 ENCOUNTER — Inpatient Hospital Stay (HOSPITAL_COMMUNITY)
Admission: EM | Admit: 2017-02-07 | Discharge: 2017-02-13 | DRG: 638 | Disposition: A | Discharge status: Home / Self Care | Payer: Self-pay | Attending: Internal Medicine | Admitting: Internal Medicine

## 2017-02-07 ENCOUNTER — Encounter (HOSPITAL_COMMUNITY): Payer: Self-pay

## 2017-02-07 DIAGNOSIS — Z5900 Homelessness unspecified: Secondary | ICD-10-CM

## 2017-02-07 DIAGNOSIS — E1165 Type 2 diabetes mellitus with hyperglycemia: Secondary | ICD-10-CM

## 2017-02-07 DIAGNOSIS — E860 Dehydration: Secondary | ICD-10-CM | POA: Diagnosis present

## 2017-02-07 DIAGNOSIS — N179 Acute kidney failure, unspecified: Secondary | ICD-10-CM | POA: Diagnosis present

## 2017-02-07 DIAGNOSIS — Z833 Family history of diabetes mellitus: Secondary | ICD-10-CM

## 2017-02-07 DIAGNOSIS — E1129 Type 2 diabetes mellitus with other diabetic kidney complication: Secondary | ICD-10-CM | POA: Diagnosis present

## 2017-02-07 DIAGNOSIS — Z681 Body mass index (BMI) 19 or less, adult: Secondary | ICD-10-CM

## 2017-02-07 DIAGNOSIS — D649 Anemia, unspecified: Secondary | ICD-10-CM | POA: Diagnosis present

## 2017-02-07 DIAGNOSIS — E1110 Type 2 diabetes mellitus with ketoacidosis without coma: Principal | ICD-10-CM | POA: Diagnosis present

## 2017-02-07 DIAGNOSIS — G8929 Other chronic pain: Secondary | ICD-10-CM | POA: Diagnosis present

## 2017-02-07 DIAGNOSIS — Z8673 Personal history of transient ischemic attack (TIA), and cerebral infarction without residual deficits: Secondary | ICD-10-CM

## 2017-02-07 DIAGNOSIS — I10 Essential (primary) hypertension: Secondary | ICD-10-CM | POA: Diagnosis present

## 2017-02-07 DIAGNOSIS — M545 Low back pain: Secondary | ICD-10-CM | POA: Diagnosis present

## 2017-02-07 DIAGNOSIS — Z608 Other problems related to social environment: Secondary | ICD-10-CM | POA: Diagnosis present

## 2017-02-07 DIAGNOSIS — E43 Unspecified severe protein-calorie malnutrition: Secondary | ICD-10-CM | POA: Diagnosis present

## 2017-02-07 DIAGNOSIS — E1101 Type 2 diabetes mellitus with hyperosmolarity with coma: Secondary | ICD-10-CM

## 2017-02-07 DIAGNOSIS — E1310 Other specified diabetes mellitus with ketoacidosis without coma: Secondary | ICD-10-CM

## 2017-02-07 DIAGNOSIS — Z79899 Other long term (current) drug therapy: Secondary | ICD-10-CM

## 2017-02-07 DIAGNOSIS — E440 Moderate protein-calorie malnutrition: Secondary | ICD-10-CM | POA: Diagnosis present

## 2017-02-07 DIAGNOSIS — R0600 Dyspnea, unspecified: Secondary | ICD-10-CM | POA: Diagnosis not present

## 2017-02-07 DIAGNOSIS — Z794 Long term (current) use of insulin: Secondary | ICD-10-CM

## 2017-02-07 DIAGNOSIS — I69318 Other symptoms and signs involving cognitive functions following cerebral infarction: Secondary | ICD-10-CM

## 2017-02-07 DIAGNOSIS — E1100 Type 2 diabetes mellitus with hyperosmolarity without nonketotic hyperglycemic-hyperosmolar coma (NKHHC): Secondary | ICD-10-CM

## 2017-02-07 DIAGNOSIS — F17210 Nicotine dependence, cigarettes, uncomplicated: Secondary | ICD-10-CM | POA: Diagnosis present

## 2017-02-07 DIAGNOSIS — E785 Hyperlipidemia, unspecified: Secondary | ICD-10-CM | POA: Diagnosis present

## 2017-02-07 LAB — CBG MONITORING, ED
GLUCOSE-CAPILLARY: 457 mg/dL — AB (ref 65–99)
Glucose-Capillary: 349 mg/dL — ABNORMAL HIGH (ref 65–99)

## 2017-02-07 LAB — MRSA PCR SCREENING: MRSA by PCR: NEGATIVE

## 2017-02-07 LAB — BASIC METABOLIC PANEL
Anion gap: 22 — ABNORMAL HIGH (ref 5–15)
Anion gap: 15 (ref 5–15)
Anion gap: 14 (ref 5–15)
BUN: 51 mg/dL — AB (ref 6–20)
BUN: 42 mg/dL — AB (ref 6–20)
BUN: 38 mg/dL — AB (ref 6–20)
CALCIUM: 9.6 mg/dL (ref 8.9–10.3)
CHLORIDE: 112 mmol/L — AB (ref 101–111)
CHLORIDE: 112 mmol/L — AB (ref 101–111)
CO2: 21 mmol/L — ABNORMAL LOW (ref 22–32)
CO2: 22 mmol/L (ref 22–32)
CO2: 25 mmol/L (ref 22–32)
CREATININE: 2.29 mg/dL — AB (ref 0.61–1.24)
CREATININE: 1.72 mg/dL — AB (ref 0.61–1.24)
CREATININE: 1.54 mg/dL — AB (ref 0.61–1.24)
Calcium: 8.6 mg/dL — ABNORMAL LOW (ref 8.9–10.3)
Calcium: 8.8 mg/dL — ABNORMAL LOW (ref 8.9–10.3)
Chloride: 98 mmol/L — ABNORMAL LOW (ref 101–111)
GFR calc Af Amer: 50 mL/min — ABNORMAL LOW (ref >60)
GFR calc Af Amer: 57 mL/min — ABNORMAL LOW (ref >60)
GFR calc non Af Amer: 43 mL/min — ABNORMAL LOW (ref >60)
GFR calc non Af Amer: 49 mL/min — ABNORMAL LOW (ref >60)
GFR, EST AFRICAN AMERICAN: 35 mL/min — AB (ref >60)
GFR, EST NON AFRICAN AMERICAN: 30 mL/min — AB (ref >60)
GLUCOSE: 280 mg/dL — AB (ref 65–99)
Glucose, Bld: 739 mg/dL (ref 65–99)
Glucose, Bld: 366 mg/dL — ABNORMAL HIGH (ref 65–99)
Potassium: 5.1 mmol/L (ref 3.5–5.1)
Potassium: 4.2 mmol/L (ref 3.5–5.1)
Potassium: 3.7 mmol/L (ref 3.5–5.1)
SODIUM: 141 mmol/L (ref 135–145)
Sodium: 149 mmol/L — ABNORMAL HIGH (ref 135–145)
Sodium: 151 mmol/L — ABNORMAL HIGH (ref 135–145)

## 2017-02-07 LAB — URINALYSIS, ROUTINE W REFLEX MICROSCOPIC
Bilirubin Urine: NEGATIVE
Glucose, UA: >=500 mg/dL — AB
Ketones, ur: 20 mg/dL — AB
Leukocytes, UA: NEGATIVE
Nitrite: NEGATIVE
PROTEIN: 30 mg/dL — AB
SPECIFIC GRAVITY, URINE: 1.025 (ref 1.005–1.030)
pH: 5 (ref 5.0–8.0)

## 2017-02-07 LAB — PHOSPHORUS: Phosphorus: 5.5 mg/dL — ABNORMAL HIGH (ref 2.5–4.6)

## 2017-02-07 LAB — CBC WITH DIFFERENTIAL/PLATELET
BASOS PCT: 0 %
Basophils Absolute: 0 10*3/uL (ref 0.0–0.1)
EOS ABS: 0.1 10*3/uL (ref 0.0–0.7)
EOS PCT: 1 %
HCT: 40 % (ref 39.0–52.0)
HEMOGLOBIN: 12.7 g/dL — AB (ref 13.0–17.0)
Lymphocytes Relative: 22 %
Lymphs Abs: 1.8 10*3/uL (ref 0.7–4.0)
MCH: 25.4 pg — ABNORMAL LOW (ref 26.0–34.0)
MCHC: 31.8 g/dL (ref 30.0–36.0)
MCV: 80 fL (ref 78.0–100.0)
MONO ABS: 0.7 10*3/uL (ref 0.1–1.0)
Monocytes Relative: 8 %
NEUTROS PCT: 69 %
Neutro Abs: 5.7 10*3/uL (ref 1.7–7.7)
PLATELETS: 399 10*3/uL (ref 150–400)
RBC: 5 MIL/uL (ref 4.22–5.81)
RDW: 12.7 % (ref 11.5–15.5)
WBC: 8.3 10*3/uL (ref 4.0–10.5)

## 2017-02-07 LAB — HEMOGLOBIN A1C
Hgb A1c MFr Bld: 10.9 % — ABNORMAL HIGH (ref 4.8–5.6)
Mean Plasma Glucose: 266.13 mg/dL

## 2017-02-07 LAB — I-STAT VENOUS BLOOD GAS, ED
ACID-BASE EXCESS: 1 mmol/L (ref 0.0–2.0)
Bicarbonate: 26.8 mmol/L (ref 20.0–28.0)
O2 Saturation: 40 %
PH VEN: 7.359 (ref 7.250–7.430)
TCO2: 28 mmol/L (ref 22–32)
pCO2, Ven: 47.6 mmHg (ref 44.0–60.0)
pO2, Ven: 24 mmHg — CL (ref 32.0–45.0)

## 2017-02-07 LAB — GLUCOSE, CAPILLARY
GLUCOSE-CAPILLARY: 195 mg/dL — AB (ref 65–99)
GLUCOSE-CAPILLARY: 236 mg/dL — AB (ref 65–99)
GLUCOSE-CAPILLARY: 282 mg/dL — AB (ref 65–99)
Glucose-Capillary: 233 mg/dL — ABNORMAL HIGH (ref 65–99)

## 2017-02-07 LAB — BETA-HYDROXYBUTYRIC ACID: Beta-Hydroxybutyric Acid: 3.65 mmol/L — ABNORMAL HIGH (ref 0.05–0.27)

## 2017-02-07 LAB — MAGNESIUM: MAGNESIUM: 3 mg/dL — AB (ref 1.7–2.4)

## 2017-02-07 MED ORDER — SODIUM CHLORIDE 0.9 % IV BOLUS (SEPSIS)
2000.0000 mL | Freq: Once | INTRAVENOUS | Status: AC
  Administered 2017-02-07: 2000 mL via INTRAVENOUS

## 2017-02-07 MED ORDER — SODIUM CHLORIDE 0.9 % IV SOLN
INTRAVENOUS | Status: DC
  Filled 2017-02-07: qty 1

## 2017-02-07 MED ORDER — ONDANSETRON HCL 4 MG/2ML IJ SOLN
4.0000 mg | Freq: Four times a day (QID) | INTRAMUSCULAR | Status: DC | PRN
  Administered 2017-02-07 – 2017-02-10 (×3): 4 mg via INTRAVENOUS
  Filled 2017-02-07 (×3): qty 2

## 2017-02-07 MED ORDER — ENOXAPARIN SODIUM 40 MG/0.4ML ~~LOC~~ SOLN
40.0000 mg | SUBCUTANEOUS | Status: DC
  Administered 2017-02-07 – 2017-02-08 (×2): 40 mg via SUBCUTANEOUS
  Filled 2017-02-07 (×2): qty 0.4

## 2017-02-07 MED ORDER — DEXTROSE-NACL 5-0.45 % IV SOLN: INTRAVENOUS | Status: DC

## 2017-02-07 MED ORDER — HYDRALAZINE HCL 20 MG/ML IJ SOLN
10.0000 mg | Freq: Four times a day (QID) | INTRAMUSCULAR | Status: DC | PRN
  Administered 2017-02-08 – 2017-02-12 (×8): 10 mg via INTRAVENOUS
  Filled 2017-02-07 (×10): qty 1

## 2017-02-07 MED ORDER — FERROUS SULFATE 325 (65 FE) MG PO TABS
325.0000 mg | ORAL_TABLET | Freq: Every day | ORAL | Status: DC
  Administered 2017-02-08 – 2017-02-13 (×6): 325 mg via ORAL
  Filled 2017-02-07 (×6): qty 1

## 2017-02-07 MED ORDER — SODIUM CHLORIDE 0.9 % IV SOLN
INTRAVENOUS | Status: DC
  Administered 2017-02-07 – 2017-02-09 (×3): via INTRAVENOUS

## 2017-02-07 MED ORDER — HYDRALAZINE HCL 20 MG/ML IJ SOLN
5.0000 mg | Freq: Once | INTRAMUSCULAR | Status: AC
  Administered 2017-02-07: 5 mg via INTRAVENOUS

## 2017-02-07 MED ORDER — ATORVASTATIN CALCIUM 40 MG PO TABS
40.0000 mg | ORAL_TABLET | Freq: Every day | ORAL | Status: DC
  Administered 2017-02-08 – 2017-02-13 (×6): 40 mg via ORAL
  Filled 2017-02-07 (×6): qty 1

## 2017-02-07 MED ORDER — SODIUM CHLORIDE 0.9 % IV SOLN
INTRAVENOUS | Status: DC
  Administered 2017-02-07: 5.4 [IU]/h via INTRAVENOUS
  Filled 2017-02-07: qty 1

## 2017-02-07 MED ORDER — DEXTROSE-NACL 5-0.45 % IV SOLN
INTRAVENOUS | Status: DC
  Administered 2017-02-08 (×2): via INTRAVENOUS

## 2017-02-07 NOTE — H&P (Signed)
History and Physical    Mike [name] QMG:[id number] DOB: 1960-12-27 DOA: 02/07/2017  PCP: [name], NP  Patient coming from: Street  Chief Complaint: Hyperglycemia  HPI: Mike [name] is a 57 y.o. male with medical history significant of insulin-dependent diabetes, cognitive impairment, essential hypertension, stroke.  Patient presents to the ED today for management of his hyperglycemia.  He states that he went to [employer] to have his blood sugar checked and that it was high.  He came to the emergency department after that.  Patient states that he gives himself insulin for his hyperglycemia.  He is unable to tell me how much insulin he gives himself.  He gives himself random quantities as he states that he does not have any specific regimen, nor does he measure out his insulin.  ED Course: Vitals: Afebrile, pulse of 70, respirations 20, blood pressure slightly elevated at 172/84, on room air Labs: Glucose of 739, CO2 of 21, BUN and creatinine of 51/2.29, anion gap of 22, phosphorus of 5.5 and magnesium of 3.0. Imaging: None obtained Medications/Course: 2 L normal saline via IV, insulin drip  Review of Systems: Review of Systems  Constitutional: Negative for chills and fever.  Respiratory: Negative for cough.   Cardiovascular: Negative for chest pain and palpitations.  Gastrointestinal: Negative for abdominal pain, nausea and vomiting.  Neurological: Negative for seizures and loss of consciousness.  All other systems reviewed and are negative.   Past Medical History:  Diagnosis Date  . Anxiety   . Anxiety   . Chronic lower back pain   . Depression   . DKA (diabetic ketoacidoses) ([place]) 07/30/2016  . Hyperlipemia   . Hypertension   . Migraine    "last one was ~ 4 yr ago" (12/23/2014)  . Seizures ([place])    "related to pills for anxiety; if I don't take the pills I'm suppose to take I'll have them" (12/23/2014)  . Type II diabetes mellitus ([place])     Past  Surgical History:  Procedure Laterality Date  . NO PAST SURGERIES       reports that he has been smoking cigarettes.  He has a 4.00 pack-year smoking history. he has never used smokeless tobacco. He reports that he drinks about 1.2 oz of alcohol per week. He reports that he does not use drugs.  Allergies  Allergen Reactions  . Ibuprofen Nausea And Vomiting and Other (See Comments)    Makes the patient feel bloated also  . Tylenol [Acetaminophen] Nausea And Vomiting and Other (See Comments)    Makes the patient feel bloated also    Family History  Problem Relation Age of Onset  . Diabetes Mellitus II Mother   . Diabetes Mellitus II Father     Prior to Admission medications   Medication Sig Start Date End Date Taking? Authorizing Provider  acetaminophen (TYLENOL) 500 MG tablet Take 500-1,000 mg by mouth every 6 (six) hours as needed (for pain or headaches).    [provider]  atorvastatin (LIPITOR) 40 MG tablet Take 1 tablet (40 mg total) by mouth daily. 01/26/17   Cardama, Grayce Sessions, MD  blood glucose meter kit and supplies KIT Dispense based on patient and insurance preference. Use up to four times daily as directed. (FOR ICD-9 250.00, 250.01). 11/22/16   Velvet Bathe, MD  chlorthalidone (HYGROTON) 25 MG tablet Take 1 tablet (25 mg total) by mouth daily. 01/26/17   Fatima Blank, MD  ferrous sulfate 325 (65 FE) MG tablet Take 1  tablet (325 mg total) daily by mouth. 11/23/16   Ward, Delice Bison, DO  insulin aspart protamine- aspart (NOVOLOG MIX 70/30) (70-30) 100 UNIT/ML injection Inject 0.3 mLs (30 Units total) into the skin 2 (two) times daily with a meal. 01/30/17   Dorie Rank, MD  lisinopril (PRINIVIL,ZESTRIL) 40 MG tablet Take 1 tablet (40 mg total) by mouth daily. 01/26/17   CardamaGrayce Sessions, MD  multivitamin (PROSIGHT) TABS tablet Take 1 tablet by mouth daily. Patient not taking: Reported on 01/30/2017 12/22/16   Rai, Vernelle Emerald, MD  protein supplement shake  (PREMIER PROTEIN) LIQD Take 325 mLs (11 oz total) by mouth 2 (two) times daily between meals. Patient not taking: Reported on 01/30/2017 12/22/16   Mendel Corning, MD    Physical Exam: Vitals:   02/07/17 1452 02/07/17 1538  BP: (!) 183/86 (!) 183/91  Pulse: 85 88  Resp: 16 16  Temp: 99 F (37.2 C)   TempSrc: Oral   SpO2: 99% 98%     Constitutional: NAD, calm, comfortable Eyes: PERRL, lids and conjunctivae normal ENMT: Mucous membranes are dry. Posterior pharynx clear of any exudate or lesions. Poor dentition.  Neck: normal, supple, no masses, no thyromegaly Respiratory: clear to auscultation bilaterally, no wheezing, no crackles. Normal respiratory effort. No accessory muscle use.  Cardiovascular: Regular rate and rhythm, no murmurs / rubs / gallops. No extremity edema. 2+ pedal pulses. No carotid bruits.  Abdomen: no tenderness, no masses palpated. No hepatosplenomegaly. Bowel sounds positive.  Musculoskeletal: no clubbing / cyanosis. No joint deformity upper and lower extremities. Good ROM, no contractures. Normal muscle tone.  Skin: no rashes, lesions, ulcers. No induration Neurologic: CN 2-12 grossly intact. Sensation intact, DTR normal. Strength 5/5 in all 4.  Psychiatric: Impaired judgment and insight. Alert and oriented x 2. Flat affect.    Labs on Admission: I have personally reviewed following labs and imaging studies  CBC: Recent Labs  Lab 02/07/17 1458  WBC 8.3  NEUTROABS 5.7  HGB 12.7*  HCT 40.0  MCV 80.0  PLT 283   Basic Metabolic Panel: Recent Labs  Lab 02/07/17 1458  NA 141  K 5.1  CL 98*  CO2 21*  GLUCOSE 739*  BUN 51*  CREATININE 2.29*  CALCIUM 9.6  MG 3.0*  PHOS 5.5*   GFR: Estimated Creatinine Clearance: 39.7 mL/min (A) (by C-G formula based on SCr of 2.29 mg/dL (H)).  CBG: Recent Labs  Lab 02/07/17 1450  GLUCAP >600*   Urine analysis:    Component Value Date/Time   COLORURINE YELLOW 02/07/2017 [place]  02/07/2017 1549   LABSPEC 1.025 02/07/2017 1549   PHURINE 5.0 02/07/2017 1549   GLUCOSEU >=500 (A) 02/07/2017 1549   HGBUR SMALL (A) 02/07/2017 1549   BILIRUBINUR NEGATIVE 02/07/2017 1549   KETONESUR 20 (A) 02/07/2017 1549   PROTEINUR 30 (A) 02/07/2017 1549   UROBILINOGEN 1.0 02/07/2011 1605   NITRITE NEGATIVE 02/07/2017 1549   LEUKOCYTESUR NEGATIVE 02/07/2017 1549    Assessment/Plan Active Problems:   Homelessness   Type II diabetes mellitus with renal manifestations (HCC)   Protein-calorie malnutrition, severe   Essential hypertension   HLD (hyperlipidemia)   DKA (diabetic ketoacidoses) (HCC)   History of stroke    DKA Insulin dependent diabetes mellitus Patient has recurring visits to the ED for hyperglycemia. Patient unable to properly administer insulin and manage his diabetes. Last A1C of 13.0% in 10/2016. Currently, he has an elevated blood sugar, anion gap with mild metabolic acidosis without  acidemia (on VBG) and ketones in urine. Patient meets two out of three criteria. -Give another 2 L NS bolus -serum ketones -IV insulin -NPO while on drip -CSW consult -PT eval  Protein-calorie malnutrition Secondary to chronic disease and homelessness -hold protein supplement while NPO  Acute kidney injury Secondary to prerenal disease in setting of DKA and dehydration. -IV fluids -Repeat BMP  History of stroke MRI from 07/2016 significant for severe chronic microvascular ischemic changes and severe parenchymal volume loss. Contributing to patient's cognitive impairment and lack of capacity -Continue Lipitor when taking PO  Social issues Patient recently with prolonged admission secondary to discharge planning issues as patient has cognitive impairment and insulin dependent DM. Evaluated by psychiatry last admission and patient does not have capacity to manage his medical issues. -CSW consult  Essential hypertension Blood pressure mildly elevated -Hold chlorthalidone  and lisinopril in setting of AKI    DVT prophylaxis: Lovenox Code Status: Full code Family Communication: None at bedside Disposition Plan: Difficult as patient hard to discharge on prior admission secondary to cognitive impairment and safety of managing diabetes. Consults called: None Admission status: Inpatient, SDU   Cordelia Poche, MD [facility] Pager [phone]  If 7PM-7AM, please contact night-coverage [email or web address] Password [facility]  02/07/2017, 5:46 PM

## 2017-02-07 NOTE — ED Notes (Signed)
Attempted x 1  

## 2017-02-07 NOTE — Progress Notes (Signed)
BP 194/85 Schorr,NP notified Pt has no PRNs at this time

## 2017-02-07 NOTE — ED Provider Notes (Signed)
[facility] EMERGENCY DEPARTMENT Provider Note   CSN: [id number] Arrival date & time: 02/07/17  1441     History   Chief Complaint Chief Complaint  Patient presents with  . Hyperglycemia    HPI Mike [name] is a 57 y.o. male with a PMHx of HTN, HLD, DM2, chronic back pain, anemia, and other conditions listed below, who presents to the ED with complaints of hyperglycemia.  Patient doesn't talk much and doesn't add much to the history, he answers questions mostly with yes and no answers, occasionally giving a few extra details, but is a very poor historian which limits his history slightly.  He states that this morning at 6 AM he took NovoLog 70/30 but he is not sure how many units he used, he did not check his blood sugar prior to taking his insulin nor did he check his blood sugar afterwards.  He called EMS presumably because his blood sugar was high, although he denied checking his blood sugar prior to their arrival.  On their arrival, which was approximately 1 hour prior to evaluation, his CBG was "high".  He denies eating anything today, no known aggravating factors.  No other treatments tried prior to arrival other than his NovoLog earlier this morning, but he did not take any other insulin or medications later on or prior to EMS arrival.  Of note, he did not take his blood pressure medicines today, but states that he took them yesterday.  He denies any medical complaints at this time and is asymptomatic with regards to his hyperglycemia and his HTN, denying having any cough or URI symptoms, fevers, chills, CP, SOB, abd pain, N/V/D/C, hematuria, dysuria, myalgias, arthralgias, numbness, tingling, focal weakness, vision changes, HA, polyuria, polydipsia, LE swelling, or any other complaints at this time.  Also of note, he's had 31 ED visits in the last 6 months, and has been admitted 6 times in the last 6 months, most of the time for hyperglycemia.  His last admission was  11/26/16 and his last ED visit was 01/30/17.  His PCP is [name] NP   The history is provided by the patient and medical records. No language interpreter was used.  Hyperglycemia  Blood sugar level PTA:  "HIGH" Severity:  Moderate Onset quality:  Unable to specify Timing:  Constant Progression:  Unchanged Chronicity:  Recurrent Diabetes status:  Controlled with insulin Current diabetic therapy:  Novolog 70/30 (rx'd 30U BID but pt unsure how much he took this morning) Time since last antidiabetic medication:  10 hours Context: not change in medication, not recent change in diet and not recent illness   Relieved by:  Nothing Ineffective treatments:  None tried Associated symptoms: no abdominal pain, no blurred vision, no chest pain, no confusion, no dysuria, no fever, no increased thirst, no nausea, no polyuria, no shortness of breath, no vomiting and no weakness     Past Medical History:  Diagnosis Date  . Anxiety   . Anxiety   . Chronic lower back pain   . Depression   . DKA (diabetic ketoacidoses) ([place]) 07/30/2016  . Hyperlipemia   . Hypertension   . Migraine    "last one was ~ 4 yr ago" (12/23/2014)  . Seizures ([place])    "related to pills for anxiety; if I don't take the pills I'm suppose to take I'll have them" (12/23/2014)  . Type II diabetes mellitus [facility])     Patient Active Problem List   Diagnosis Date Noted  .  Hyperphosphatemia 12/13/2016  . Evaluation by psychiatric service required 11/30/2016  . Lactic acidosis 11/18/2016  . Normocytic anemia 11/06/2016  . HLD (hyperlipidemia) 11/06/2016  . Adjustment disorder with other symptoms 11/05/2016  . Uncontrolled type 2 diabetes mellitus with hyperosmolar nonketotic hyperglycemia ([place]) 08/24/2016  . Essential hypertension 08/24/2016  . Acute ischemic stroke ([place])   . Protein-calorie malnutrition, severe 12/24/2014  . Homelessness 12/23/2014  . DM (diabetes mellitus) ([place]) 12/23/2014  . Intermittent  palpitations 12/23/2014  . Tobacco abuse 12/23/2014  . Pleuritic chest pain 12/23/2014  . Abnormal EKG 12/23/2014  . Type II diabetes mellitus with renal manifestations [facility])     Past Surgical History:  Procedure Laterality Date  . NO PAST SURGERIES         Home Medications    Prior to Admission medications   Medication Sig Start Date End Date Taking? Authorizing Provider  acetaminophen (TYLENOL) 500 MG tablet Take 500-1,000 mg by mouth every 6 (six) hours as needed (for pain or headaches).    [provider]  atorvastatin (LIPITOR) 40 MG tablet Take 1 tablet (40 mg total) by mouth daily. 01/26/17   Cardama, Grayce Sessions, MD  blood glucose meter kit and supplies KIT Dispense based on patient and insurance preference. Use up to four times daily as directed. (FOR ICD-9 250.00, 250.01). 11/22/16   Velvet Bathe, MD  chlorthalidone (HYGROTON) 25 MG tablet Take 1 tablet (25 mg total) by mouth daily. 01/26/17   Fatima Blank, MD  ferrous sulfate 325 (65 FE) MG tablet Take 1 tablet (325 mg total) daily by mouth. 11/23/16   Ward, Delice Bison, DO  insulin aspart protamine- aspart (NOVOLOG MIX 70/30) (70-30) 100 UNIT/ML injection Inject 0.3 mLs (30 Units total) into the skin 2 (two) times daily with a meal. 01/30/17   Dorie Rank, MD  lisinopril (PRINIVIL,ZESTRIL) 40 MG tablet Take 1 tablet (40 mg total) by mouth daily. 01/26/17   CardamaGrayce Sessions, MD  multivitamin (PROSIGHT) TABS tablet Take 1 tablet by mouth daily. Patient not taking: Reported on 01/30/2017 12/22/16   Rai, Vernelle Emerald, MD  protein supplement shake (PREMIER PROTEIN) LIQD Take 325 mLs (11 oz total) by mouth 2 (two) times daily between meals. Patient not taking: Reported on 01/30/2017 12/22/16   Mendel Corning, MD    Family History Family History  Problem Relation Age of Onset  . Diabetes Mellitus II Mother   . Diabetes Mellitus II Father     Social History Social History   Tobacco Use  . Smoking status:  Current Every Day Smoker    Packs/day: 0.10    Years: 40.00    Pack years: 4.00    Types: Cigarettes  . Smokeless tobacco: Never Used  Substance Use Topics  . Alcohol use: Yes    Alcohol/week: 1.2 oz    Types: 2 Cans of beer per week  . Drug use: No    Comment: 12/23/2014 "stopped ~ 10 yrs ago"     Allergies   Ibuprofen and Tylenol [acetaminophen]   Review of Systems Review of Systems  Constitutional: Negative for chills and fever.  HENT: Negative for rhinorrhea and sore throat.   Eyes: Negative for blurred vision and visual disturbance.  Respiratory: Negative for cough and shortness of breath.   Cardiovascular: Negative for chest pain and leg swelling.  Gastrointestinal: Negative for abdominal pain, constipation, diarrhea, nausea and vomiting.  Endocrine: Negative for polydipsia and polyuria.  Genitourinary: Negative for dysuria and hematuria.  Musculoskeletal: Negative for arthralgias  and myalgias.  Skin: Negative for color change.  Allergic/Immunologic: Positive for immunocompromised state (DM2).  Neurological: Negative for weakness, numbness and headaches.  Psychiatric/Behavioral: Negative for confusion.   All other systems reviewed and are negative for acute change except as noted in the HPI.    Physical Exam Updated Vital Signs BP (!) 183/86 (BP Location: Right Arm)   Pulse 85   Temp 99 F (37.2 C) (Oral)   Resp 16   SpO2 99%   Physical Exam  Constitutional: He is oriented to person, place, and time. He appears well-developed and well-nourished.  Non-toxic appearance. No distress.  Afebrile, nontoxic, NAD, BP 180s/80s which is similar to prior visits. Somewhat disheveled appearance. Minimally verbal, although A&Ox4  HENT:  Head: Normocephalic and atraumatic.  Mouth/Throat: Oropharynx is clear and moist and mucous membranes are normal.  Eyes: Conjunctivae and EOM are normal. Right eye exhibits no discharge. Left eye exhibits no discharge.  Neck: Normal range  of motion. Neck supple.  Cardiovascular: Normal rate, regular rhythm, normal heart sounds and intact distal pulses. Exam reveals no gallop and no friction rub.  No murmur heard. Pulmonary/Chest: Effort normal and breath sounds normal. No respiratory distress. He has no decreased breath sounds. He has no wheezes. He has no rhonchi. He has no rales.  Abdominal: Soft. Normal appearance and bowel sounds are normal. He exhibits no distension. There is no tenderness. There is no rigidity, no rebound, no guarding, no CVA tenderness, no tenderness at McBurney's point and negative Murphy's sign.  Musculoskeletal: Normal range of motion.  Neurological: He is alert and oriented to person, place, and time. He has normal strength. No sensory deficit.  Skin: Skin is warm, dry and intact. No rash noted.  Psychiatric: His affect is blunt.  Flat affect  Nursing note and vitals reviewed.    ED Treatments / Results  Labs (all labs ordered are listed, but only abnormal results are displayed) Labs Reviewed  CBC WITH DIFFERENTIAL/PLATELET - Abnormal; Notable for the following components:      Result Value   Hemoglobin 12.7 (*)    MCH 25.4 (*)    All other components within normal limits  BASIC METABOLIC PANEL - Abnormal; Notable for the following components:   Chloride 98 (*)    CO2 21 (*)    Glucose, Bld 739 (*)    BUN 51 (*)    Creatinine, Ser 2.29 (*)    GFR calc non Af Amer 30 (*)    GFR calc Af Amer 35 (*)    Anion gap 22 (*)    All other components within normal limits  PHOSPHORUS - Abnormal; Notable for the following components:   Phosphorus 5.5 (*)    All other components within normal limits  MAGNESIUM - Abnormal; Notable for the following components:   Magnesium 3.0 (*)    All other components within normal limits  URINALYSIS, ROUTINE W REFLEX MICROSCOPIC - Abnormal; Notable for the following components:   Glucose, UA >=500 (*)    Hgb urine dipstick SMALL (*)    Ketones, ur 20 (*)     Protein, ur 30 (*)    Bacteria, UA RARE (*)    Squamous Epithelial / LPF 0-5 (*)    All other components within normal limits  CBG MONITORING, ED - Abnormal; Notable for the following components:   Glucose-Capillary >600 (*)    All other components within normal limits  I-STAT VENOUS BLOOD GAS, ED - Abnormal; Notable for the following components:  pO2, Ven 24.0 (*)    All other components within normal limits    EKG  EKG Interpretation None       Radiology No results found.  Procedures Procedures (including critical care time)   CRITICAL CARE Performed by: Reece Agar   Total critical care time: 45 minutes  Critical care time was exclusive of separately billable procedures and treating other patients.  Critical care was necessary to treat or prevent imminent or life-threatening deterioration.  Critical care was time spent personally by me on the following activities: development of treatment plan with patient and/or surrogate as well as nursing, discussions with consultants, evaluation of patient's response to treatment, examination of patient, obtaining history from patient or surrogate, ordering and performing treatments and interventions, ordering and review of laboratory studies, ordering and review of radiographic studies, pulse oximetry and re-evaluation of patient's condition.   Medications Ordered in ED Medications  dextrose 5 %-0.45 % sodium chloride infusion (not administered)  insulin regular (NOVOLIN R,HUMULIN R) 100 Units in sodium chloride 0.9 % 100 mL (1 Units/mL) infusion (not administered)  sodium chloride 0.9 % bolus 2,000 mL (2,000 mLs Intravenous New Bag/Given 02/07/17 1610)     Initial Impression / Assessment and Plan / ED Course  I have reviewed the triage vital signs and the nursing notes.  Pertinent labs & imaging results that were available during my care of the patient were reviewed by me and considered in my medical decision making (see  chart for details).     57 y.o. male here with hyperglycemia. Pt doesn't really contribute much to the history, mostly just gives yes and no answers, however he's A&O x4, but the history is slightly limited. States he didn't check his CBG but took unknown amt of novolog 70/30 at 6am, and called EMS for hyperglycemia but didn't check it at that time either. He has absolutely no symptoms at all. On exam, somewhat disheveled, but otherwise benign exam. BP elevated similarly to all prior visits. Pt asymptomatic from his hyperglycemia and his hypertension. CBG on arrival was >600. Will get labs, U/A, VBG, and give 2L bolus. Will hold off on giving insulin until we find out what his sugar is on his BMP. Will reassess shortly.   5:14 PM CBC w/diff overall unremarkable. BMP with glucose 739, bicarb 21, anion gap 22, and AKI with BUN 51 and Cr 2.29 (usually Cr 0.8-1.0 range). Phos elevated at 5.5. Magnesium elevated at 3.0. VBG reassuring with no acidosis. U/A with 20 ketones and 30 proteins,no evidence of infection.  Given degree of hyperglycemia and anion gap, will start on glucostabilizer and admit for HHS/diabetic ketosis without acidemia, as well as admission for his AKI. Discussed case with my attending Dr. Rogene Houston who agrees with plan.   5:48 PM Dr. Cordelia Poche of [facility] returning page and will admit. Holding orders to be placed by admitting team. Please see their notes for further documentation of care. I appreciate their help with this pleasant pt's care. Pt stable at time of admission.     Final Clinical Impressions(s) / ED Diagnoses   Final diagnoses:  Type 2 diabetes mellitus with hyperglycemia, with long-term current use of insulin (HCC)  Essential hypertension  Diabetic ketosis (HCC)  Hyperosmolar non-ketotic state in patient with type 2 diabetes mellitus ([place])  AKI (acute kidney injury) [facility])  Hyperphosphatemia  Hypermagnesemia    ED Discharge Orders    [address]  [place], [place] 02/07/17 [phone]  Fredia Sorrow, MD 02/08/17 2154

## 2017-02-07 NOTE — Progress Notes (Signed)
Schorr, NP notified via text page of pt emesis x1 and high BP. Pt NPO at this time and has no PRNs available to administer.

## 2017-02-07 NOTE — ED Triage Notes (Signed)
Pt arrives EMS with complaints of high blood sugar. CBG read HIGH pta. Denies n/v, or any pain. PT states this morning he used insulin but not sure how much he used.PT states he did not check his sugar prior to administering insulin.  #20 LAC 188/102 Hr hr 84 spo2 100%  rr 20

## 2017-02-08 ENCOUNTER — Other Ambulatory Visit: Payer: Self-pay

## 2017-02-08 DIAGNOSIS — E440 Moderate protein-calorie malnutrition: Secondary | ICD-10-CM

## 2017-02-08 LAB — BASIC METABOLIC PANEL
ANION GAP: 14 (ref 5–15)
Anion gap: 12 (ref 5–15)
Anion gap: 14 (ref 5–15)
BUN: 31 mg/dL — ABNORMAL HIGH (ref 6–20)
BUN: 24 mg/dL — ABNORMAL HIGH (ref 6–20)
BUN: 21 mg/dL — ABNORMAL HIGH (ref 6–20)
CALCIUM: 9.1 mg/dL (ref 8.9–10.3)
CALCIUM: 9 mg/dL (ref 8.9–10.3)
CALCIUM: 9.3 mg/dL (ref 8.9–10.3)
CO2: 22 mmol/L (ref 22–32)
CO2: 24 mmol/L (ref 22–32)
CO2: 23 mmol/L (ref 22–32)
CREATININE: 1.26 mg/dL — AB (ref 0.61–1.24)
CREATININE: 1.14 mg/dL (ref 0.61–1.24)
Chloride: 115 mmol/L — ABNORMAL HIGH (ref 101–111)
Chloride: 114 mmol/L — ABNORMAL HIGH (ref 101–111)
Chloride: 111 mmol/L (ref 101–111)
Creatinine, Ser: 1.2 mg/dL (ref 0.61–1.24)
Glucose, Bld: 210 mg/dL — ABNORMAL HIGH (ref 65–99)
Glucose, Bld: 235 mg/dL — ABNORMAL HIGH (ref 65–99)
Glucose, Bld: 202 mg/dL — ABNORMAL HIGH (ref 65–99)
Potassium: 3.9 mmol/L (ref 3.5–5.1)
Potassium: 3.7 mmol/L (ref 3.5–5.1)
Potassium: 3.8 mmol/L (ref 3.5–5.1)
SODIUM: 151 mmol/L — AB (ref 135–145)
SODIUM: 150 mmol/L — AB (ref 135–145)
SODIUM: 148 mmol/L — AB (ref 135–145)

## 2017-02-08 LAB — GLUCOSE, CAPILLARY
GLUCOSE-CAPILLARY: 198 mg/dL — AB (ref 65–99)
GLUCOSE-CAPILLARY: 195 mg/dL — AB (ref 65–99)
GLUCOSE-CAPILLARY: 186 mg/dL — AB (ref 65–99)
Glucose-Capillary: 328 mg/dL — ABNORMAL HIGH (ref 65–99)
Glucose-Capillary: 169 mg/dL — ABNORMAL HIGH (ref 65–99)
Glucose-Capillary: 197 mg/dL — ABNORMAL HIGH (ref 65–99)
Glucose-Capillary: 218 mg/dL — ABNORMAL HIGH (ref 65–99)
Glucose-Capillary: 210 mg/dL — ABNORMAL HIGH (ref 65–99)
Glucose-Capillary: 226 mg/dL — ABNORMAL HIGH (ref 65–99)
Glucose-Capillary: 230 mg/dL — ABNORMAL HIGH (ref 65–99)
Glucose-Capillary: 194 mg/dL — ABNORMAL HIGH (ref 65–99)
Glucose-Capillary: 157 mg/dL — ABNORMAL HIGH (ref 65–99)
Glucose-Capillary: 423 mg/dL — ABNORMAL HIGH (ref 65–99)
Glucose-Capillary: 415 mg/dL — ABNORMAL HIGH (ref 65–99)
Glucose-Capillary: 449 mg/dL — ABNORMAL HIGH (ref 65–99)
Glucose-Capillary: 456 mg/dL — ABNORMAL HIGH (ref 65–99)

## 2017-02-08 LAB — MAGNESIUM: Magnesium: 2.6 mg/dL — ABNORMAL HIGH (ref 1.7–2.4)

## 2017-02-08 LAB — PHOSPHORUS: Phosphorus: 2.4 mg/dL — ABNORMAL LOW (ref 2.5–4.6)

## 2017-02-08 MED ORDER — CHLORHEXIDINE GLUCONATE 0.12 % MT SOLN
15.0000 mL | Freq: Two times a day (BID) | OROMUCOSAL | Status: DC
  Administered 2017-02-08 – 2017-02-13 (×12): 15 mL via OROMUCOSAL
  Filled 2017-02-08 (×10): qty 15

## 2017-02-08 MED ORDER — ORAL CARE MOUTH RINSE
15.0000 mL | Freq: Two times a day (BID) | OROMUCOSAL | Status: DC
  Administered 2017-02-09 – 2017-02-12 (×8): 15 mL via OROMUCOSAL

## 2017-02-08 MED ORDER — INSULIN ASPART 100 UNIT/ML ~~LOC~~ SOLN: 0.0000 [IU] | Freq: Every day | SUBCUTANEOUS | Status: DC

## 2017-02-08 MED ORDER — HYDRALAZINE HCL 20 MG/ML IJ SOLN
10.0000 mg | Freq: Once | INTRAMUSCULAR | Status: AC
  Administered 2017-02-08: 10 mg via INTRAVENOUS

## 2017-02-08 MED ORDER — INSULIN ASPART PROT & ASPART (70-30 MIX) 100 UNIT/ML ~~LOC~~ SUSP
20.0000 [IU] | Freq: Two times a day (BID) | SUBCUTANEOUS | Status: DC
  Administered 2017-02-08 (×2): 20 [IU] via SUBCUTANEOUS
  Filled 2017-02-08: qty 10

## 2017-02-08 MED ORDER — INSULIN ASPART 100 UNIT/ML ~~LOC~~ SOLN
5.0000 [IU] | Freq: Once | SUBCUTANEOUS | Status: AC
  Administered 2017-02-08: 5 [IU] via SUBCUTANEOUS

## 2017-02-08 MED ORDER — ENSURE ENLIVE PO LIQD
237.0000 mL | Freq: Three times a day (TID) | ORAL | Status: DC
  Administered 2017-02-08 – 2017-02-10 (×6): 237 mL via ORAL

## 2017-02-08 MED ORDER — PROMETHAZINE HCL 25 MG/ML IJ SOLN
12.5000 mg | Freq: Once | INTRAMUSCULAR | Status: AC
  Administered 2017-02-08: 12.5 mg via INTRAVENOUS
  Filled 2017-02-08: qty 1

## 2017-02-08 MED ORDER — HYDRALAZINE HCL 20 MG/ML IJ SOLN
10.0000 mg | Freq: Once | INTRAMUSCULAR | Status: AC
  Administered 2017-02-08: 10 mg via INTRAVENOUS
  Filled 2017-02-08: qty 1

## 2017-02-08 MED ORDER — INSULIN ASPART 100 UNIT/ML ~~LOC~~ SOLN
10.0000 [IU] | Freq: Once | SUBCUTANEOUS | Status: AC
  Administered 2017-02-08: 10 [IU] via SUBCUTANEOUS

## 2017-02-08 MED ORDER — CHLORTHALIDONE 25 MG PO TABS
25.0000 mg | ORAL_TABLET | Freq: Every day | ORAL | Status: DC
  Administered 2017-02-08: 25 mg via ORAL
  Filled 2017-02-08 (×2): qty 1

## 2017-02-08 MED ORDER — PREMIER PROTEIN SHAKE
11.0000 [oz_av] | Freq: Two times a day (BID) | ORAL | Status: DC
  Administered 2017-02-08 (×2): 11 [oz_av] via ORAL
  Filled 2017-02-08 (×4): qty 325.31

## 2017-02-08 MED ORDER — LISINOPRIL 40 MG PO TABS
40.0000 mg | ORAL_TABLET | Freq: Every day | ORAL | Status: DC
  Administered 2017-02-08 – 2017-02-13 (×6): 40 mg via ORAL
  Filled 2017-02-08: qty 1
  Filled 2017-02-08 (×2): qty 2
  Filled 2017-02-08: qty 1
  Filled 2017-02-08 (×2): qty 2

## 2017-02-08 MED ORDER — CLONIDINE HCL 0.1 MG PO TABS
0.1000 mg | ORAL_TABLET | Freq: Once | ORAL | Status: AC
  Administered 2017-02-08: 0.1 mg via ORAL
  Filled 2017-02-08: qty 1

## 2017-02-08 NOTE — Evaluation (Signed)
Physical Therapy Evaluation Patient Details Name: Mike [name] MRN: [id number] DOB: 10/12/1960 Today's Date: 02/08/2017   History of Present Illness  Pt is a 57 y/o homeless male with a PMH significant for medical noncompliance, HTN, depression, anxiety, seizure, CKD and DM. Pt has had 24 ED visits and 7 admissions in the past 6 months. He is currently admitted for uncontrolled DM and HTN.   Clinical Impression  Pt admitted with above diagnosis. Pt currently with functional limitations due to the deficits listed below (see PT Problem List). Pt was able to transfer to chair with good balance for transfer.  Pt was soaked with urine and had BM in bed and unaware.  Pt cleaned by PT.  BP on arrival 201/78.  Nurse aware and was checking to see if she could get meds for him.  Once cleaned up and in chair BP 188/67.  Other VSS.   Pt will benefit from skilled PT to increase their independence and safety with mobility to allow discharge to the venue listed below.      Follow Up Recommendations No PT follow up    Equipment Recommendations  None recommended by PT    Recommendations for Other Services       Precautions / Restrictions Precautions Precautions: Fall Restrictions Weight Bearing Restrictions: No      Mobility  Bed Mobility Overal bed mobility: Independent             General bed mobility comments: On arrival, pt condom cath loose and leaked all over pt and bed.  Cleaned pt and changed linen.  Upon pt standing, noted BM too and cleaned him further.   Transfers Overall transfer level: Needs assistance Equipment used: None Transfers: Sit to/from [employer] Sit to Stand: Supervision Stand pivot transfers: Min guard       General transfer comment: No LOB and no assist needed. Just guided pt to chair.   Ambulation/Gait             General Gait Details:  BP elevated but nurse agreed to allow OOB.   Stairs            Wheelchair Mobility     Modified Rankin (Stroke Patients Only)       Balance Overall balance assessment: Needs assistance Sitting-balance support: No upper extremity supported;Feet supported Sitting balance-Leahy Scale: Fair     Standing balance support: No upper extremity supported;During functional activity Standing balance-Leahy Scale: Fair Standing balance comment: Pt can stand statically without UE support with good balance.  No LOB or unsteadiness noted with pt getting to chair.  Pt did appear unaware that he was soiled and wet.                              Pertinent Vitals/Pain Pain Assessment: No/denies pain    Home Living Family/patient expects to be discharged to:: Shelter/Homeless                 Additional Comments: Pt reports he will not be going to a shelter at d/c, and is unsure of where he will go.     Prior Function Level of Independence: Independent         Comments: does not use AD     Hand Dominance   Dominant Hand: Right    Extremity/Trunk Assessment   Upper Extremity Assessment Upper Extremity Assessment: Defer to OT evaluation    Lower Extremity Assessment Lower Extremity Assessment:  Generalized weakness    Cervical / Trunk Assessment Cervical / Trunk Assessment: Normal  Communication   Communication: No difficulties  Cognition Arousal/Alertness: Awake/alert Behavior During Therapy: Flat affect Overall Cognitive Status: History of cognitive impairments - at baseline                                 General Comments: only answered yes no questions      General Comments      Exercises     Assessment/Plan    PT Assessment Patient needs continued PT services  PT Problem List Decreased activity tolerance;Decreased balance;Decreased mobility;Decreased knowledge of use of DME;Decreased safety awareness;Decreased knowledge of precautions       PT Treatment Interventions Gait training;DME instruction;Stair  training;Functional mobility training;Therapeutic activities;Therapeutic exercise;Balance training;Patient/family education    PT Goals (Current goals can be found in the Care Plan section)  Acute Rehab PT Goals Patient Stated Goal: unable to state, pt only answers yes no questions PT Goal Formulation: Patient unable to participate in goal setting Time For Goal Achievement: 02/22/17 Potential to Achieve Goals: Good    Frequency Min 3X/week   Barriers to discharge Decreased caregiver support      Co-evaluation               AM-PAC PT "6 Clicks" Daily Activity  Outcome Measure Difficulty turning over in bed (including adjusting bedclothes, sheets and blankets)?: None Difficulty moving from lying on back to sitting on the side of the bed? : None Difficulty sitting down on and standing up from a chair with arms (e.g., wheelchair, bedside commode, etc,.)?: None Help needed moving to and from a bed to chair (including a wheelchair)?: A Little Help needed walking in hospital room?: A Little Help needed climbing 3-5 steps with a railing? : A Lot 6 Click Score: 20    End of Session Equipment Utilized During Treatment: Gait belt Activity Tolerance: Patient tolerated treatment well(limited by incr BP) Patient left: in chair;with call bell/phone within reach;with chair alarm set Nurse Communication: Mobility status PT Visit Diagnosis: Muscle weakness (generalized) (M62.81)    Time: [id number] PT Time Calculation (min) (ACUTE ONLY): 26 min   Charges:   PT Evaluation $PT Eval Moderate Complexity: 1 Mod PT Treatments $Therapeutic Activity: 8-22 mins   PT G Codes:        Marqui Formby,PT Acute Rehabilitation [phone] (pager)   Berline Lopesawn F Maximina Pirozzi 02/08/2017, 2:04 PM

## 2017-02-08 NOTE — Progress Notes (Signed)
Inpatient Diabetes Program Recommendations  AACE/ADA: New Consensus Statement on Inpatient Glycemic Control (2015)  Target Ranges:  Prepandial:   less than 140 mg/dL      Peak postprandial:   less than 180 mg/dL (1-2 hours)      Critically ill patients:  140 - 180 mg/dL   Consult for recurrent DKA admissions. Patient has been homeless and has not been able to care for himself. Last admission Psych came and said patient did not have capacity to care for himself so we searched for placement of patient. Unsure if patient was ever placed.   Patient is well known to our team. Patient unable to store insulin. Quality of insulin would be in question if he is even able to get it. PO medication is difficult to prescribe due to inconsistent food intake due to social situation.  Unsure of the resources and education we can to at this point. Our team has seen him on multiple occassions. We have exhausted many options for patient.  Thanks,  Christena DeemShannon Sendy Pluta RN, MSN, [facility] Inpatient Diabetes Coordinator Team Pager [phone] (8a-5p)

## 2017-02-08 NOTE — Care Management Note (Addendum)
Case Management Note  Patient Details  Name: Mike [name] MRN: [id number] Date of Birth: 02-Aug-1960  Subjective/Objective:  Homeless, admitted with DKA, [facility], following up with [name].  [phone]. He will need a bus pass at discharge and Mike SellsMaryann will need to know when he is discharged so he can [facility]. [facility] and [facility] his Medicaid fell thru.  NCM [facility] patient [facility] [place] [facility], he stated [facility]. IF he is discharged over the weekend Mike SellsMaryann will need to know that he needs ast with his medications .      1/25 1222 Mike Capeeborah Anely Spiewak RN, BSN- patient put back on insulin drip today.  NCM spoke with Mike [name] about patient possibly needing LOG for SNF. Patient was Evaluated by psychiatry last admission and patient does not have capacity to manage his medical issues. Social Work is looking into working on some things for this patient.  Will update MD as soon as find out what we can do.                    Action/Plan: Will need to [facility] and [facility] at [facility] or for possible SNF.  Expected Discharge Date:                  Expected Discharge Plan:  Homeless Shelter  In-House Referral:  Clinical Social Work  Discharge planning Services  CM Consult, Medication Assistance  Post Acute Care Choice:    Choice offered to:     DME Arranged:    DME Agency:     HH Arranged:    HH Agency:     Status of Service:  In process, will continue to follow  If discussed at Long Length of Stay Meetings, dates discussed:    Additional Comments:  Mike [name], Mike Mesta Clinton, RN 02/08/2017, 2:05 PM

## 2017-02-08 NOTE — Progress Notes (Signed)
Patient had SBP in 200s. Re-checked BP on a different arm and used a different cuff. Still got a SBP in the 200s. MD notified. Received no new orders.

## 2017-02-08 NOTE — Progress Notes (Signed)
Initial Nutrition Assessment  DOCUMENTATION CODES:   Non-severe (moderate) malnutrition in context of social or environmental circumstances  INTERVENTION:  Ensure Enlive TID (each supplement provides 350 kcal and 20 grams of protein)  NUTRITION DIAGNOSIS:   Moderate Malnutrition related to social / environmental circumstances(homelessness) as evidenced by moderate muscle depletion, moderate fat depletion.  GOAL:   Patient will meet greater than or equal to 90% of their needs  MONITOR:   PO intake, Supplement acceptance, Labs, Weight trends  REASON FOR ASSESSMENT:   Malnutrition Screening Tool  ASSESSMENT:   57 yo male with PMH insulin-dependent T2DM, cognitive impairment, stroke, HTN, AKI secondary to prerenal DKA/dehydration admitted 1/23 with hyperglycemia  Pt had difficulty verbally answering questions possible due to cognitive impairments secondary to stroke. Able to answer simple yes/no questions.   Denied experiencing weight loss prior to admission; however, reports having difficulty obtaining food due to homeless status. Currently on HH/Carb modified diet but reports not eating much of the meals in the hospital (0% of breakfast);  however, confirmed that he tolerates liquids more than solid food.   Has been consuming the prescribed Premier Protein shakes. Confirmed that he would be interested in continuing to receive supplemental nutrition.  Recommend d/c Premier Protein, start Ensure Enlive TID to increase caloric intake.  Labs:  Lab Results  Component Value Date   HGBA1C 10.9 (H) 02/07/2017   CBG (last 3)  Recent Labs    02/08/17 0850 02/08/17 0947 02/08/17 1115  GLUCAP 195* 186* 157*   Sodium 148 mmol/L   Medications reviewed.  NUTRITION - FOCUSED PHYSICAL EXAM:    Most Recent Value  Orbital Region  No depletion  Upper Arm Region  Mild depletion  Thoracic and Lumbar Region  Mild depletion  Buccal Region  Moderate depletion  Temple Region   Moderate depletion  Clavicle Bone Region  Moderate depletion  Clavicle and Acromion Bone Region  Moderate depletion  Scapular Bone Region  Mild depletion  Dorsal Hand  Moderate depletion  Patellar Region  No depletion  Anterior Thigh Region  No depletion  Posterior Calf Region  Mild depletion  Edema (RD Assessment)  None  Hair  Reviewed  Eyes  Reviewed  Mouth  Reviewed  Skin  Reviewed  Nails  Reviewed     Diet Order:  Diet heart healthy/carb modified Room service appropriate? Yes; Fluid consistency: Thin  EDUCATION NEEDS:   No education needs have been identified at this time  Skin:  Skin Assessment: Reviewed RN Assessment  Last BM:  1/23  Height:   Ht Readings from Last 1 Encounters:  02/07/17 6\' 4"  (1.93 m)    Weight:   Wt Readings from Last 1 Encounters:  02/07/17 151 lb 3.8 oz (68.6 kg)    Ideal Body Weight:     BMI:  Body mass index is 18.41 kg/m.  Estimated Nutritional Needs:   Kcal:  2,200-2,400 kcal  Protein:  90-105 grams  Fluid:  2.2-2.4 L    Marjie Skiffherie N. Nicholaos Schippers, MS Dietetic Intern Pager: [phone]

## 2017-02-08 NOTE — Progress Notes (Signed)
PROGRESS NOTE    Mike [name]  YNW:[id number] DOB: 08-03-1960 DOA: 02/07/2017 PCP: [name], [name], NP   Brief Narrative: Mike [name] is a 57 y.o. male with medical history significant of insulin-dependent diabetes, cognitive impairment, essential hypertension, stroke. He presented with hyperglycemia and found to be in DKA. He was started on an insulin drip and DKA has resolved.    Assessment & Plan:   Active Problems:   Homelessness   Type II diabetes mellitus with renal manifestations (HCC)   Protein-calorie malnutrition, severe   Essential hypertension   HLD (hyperlipidemia)   DKA (diabetic ketoacidoses) (HCC)   History of stroke   DKA Insulin dependent diabetes mellitus Patient started on IV insulin with closure of anion gap. -Transition to insulin 70/30 20 units today; discontinue insulin drip 2 hours after administration of subq insulin -heart healthy/carb modified diet -CSW consult -PT eval  Protein-calorie malnutrition Secondary to chronic disease and homelessness -Continue protein supplementation  Acute kidney injury Secondary to prerenal disease in setting of DKA and dehydration. Resolved.  History of stroke MRI from 07/2016 significant for severe chronic microvascular ischemic changes and severe parenchymal volume loss. Contributing to patient's cognitive impairment and lack of capacity -Continue Lipitor  Social issues Patient recently with prolonged admission secondary to discharge planning issues as patient has cognitive impairment and insulin dependent DM. Evaluated by psychiatry last admission and patient does not have capacity to manage his medical issues. -CSW consult  Essential hypertension Uncontrolled. -Restart chlorthalidone and lisinopril -Hydralazine prn   DVT prophylaxis: Lovenox Code Status: Full code Family Communication: None at bedside Disposition Plan: Pending control of blood sugar and safe discharge  plan   Consultants:   None  Procedures:   None  Antimicrobials:  None    Subjective: Mild productive cough. Afebrile. No chest pain or dyspnea.  Objective: Vitals:   02/08/17 0400 02/08/17 0500 02/08/17 0712 02/08/17 0750  BP: (!) 174/73 (!) 192/80 (!) 201/82 (!) 185/72  Pulse: 89 87  87  Resp: 17 (!) 3  15  Temp:    99.1 F (37.3 C)  TempSrc:    Oral  SpO2: 98% 98%  98%  Weight:      Height:        Intake/Output Summary (Last 24 hours) at 02/08/2017 0924 Last data filed at 02/08/2017 0700 Gross per 24 hour  Intake 5430.25 ml  Output 450 ml  Net 4980.25 ml   Filed Weights   02/07/17 2000  Weight: 68.6 kg (151 lb 3.8 oz)    Examination:  General exam: Appears calm and comfortable Respiratory system: Clear to auscultation. Respiratory effort normal. Cardiovascular system: S1 & S2 heard, RRR. Gastrointestinal system: Abdomen is nondistended, soft and nontender. Normal bowel sounds heard. Central nervous system: Alert and oriented to person and place. No focal neurological deficits. Extremities: No edema. No calf tenderness Skin: No cyanosis. No rashes Psychiatry: Judgement and insight appear impaired. Flat affect.     Data Reviewed: I have personally reviewed following labs and imaging studies  CBC: Recent Labs  Lab 02/07/17 1458  WBC 8.3  NEUTROABS 5.7  HGB 12.7*  HCT 40.0  MCV 80.0  PLT 399   Basic Metabolic Panel: Recent Labs  Lab 02/07/17 1458 02/07/17 2002 02/07/17 2153 02/08/17 0225 02/08/17 0556  NA 141 149* 151* 151* 150*  K 5.1 4.2 3.7 3.9 3.7  CL 98* 112* 112* 115* 114*  CO2 21* 22 25 22 24   GLUCOSE 739* 366* 280* 210* 235*  BUN 51* 42*  38* 31* 24*  CREATININE 2.29* 1.72* 1.54* 1.26* 1.20  CALCIUM 9.6 8.6* 8.8* 9.1 9.0  MG 3.0*  --   --  2.6*  --   PHOS 5.5*  --   --  2.4*  --    GFR: Estimated Creatinine Clearance: 67.5 mL/min (by C-G formula based on SCr of 1.2 mg/dL). Liver Function Tests: No results for input(s):  AST, ALT, ALKPHOS, BILITOT, PROT, ALBUMIN in the last 168 hours. No results for input(s): LIPASE, AMYLASE in the last 168 hours. No results for input(s): AMMONIA in the last 168 hours. Coagulation Profile: No results for input(s): INR, PROTIME in the last 168 hours. Cardiac Enzymes: No results for input(s): CKTOTAL, CKMB, CKMBINDEX, TROPONINI in the last 168 hours. BNP (last 3 results) No results for input(s): PROBNP in the last 8760 hours. HbA1C: Recent Labs    02/07/17 2002  HGBA1C 10.9*   CBG: Recent Labs  Lab 02/08/17 0215 02/08/17 0319 02/08/17 0416 02/08/17 0511 02/08/17 0616  GLUCAP 197* 198* 210* 226* 230*   Lipid Profile: No results for input(s): CHOL, HDL, LDLCALC, TRIG, CHOLHDL, LDLDIRECT in the last 72 hours. Thyroid Function Tests: No results for input(s): TSH, T4TOTAL, FREET4, T3FREE, THYROIDAB in the last 72 hours. Anemia Panel: No results for input(s): VITAMINB12, FOLATE, FERRITIN, TIBC, IRON, RETICCTPCT in the last 72 hours. Sepsis Labs: No results for input(s): PROCALCITON, LATICACIDVEN in the last 168 hours.  Recent Results (from the past 240 hour(s))  MRSA PCR Screening     Status: None   Collection Time: 02/07/17  8:29 PM  Result Value Ref Range Status   MRSA by PCR NEGATIVE NEGATIVE Final    Comment:        The GeneXpert MRSA Assay (FDA approved for NASAL specimens only), is one component of a comprehensive MRSA colonization surveillance program. It is not intended to diagnose MRSA infection nor to guide or monitor treatment for MRSA infections.          Radiology Studies: No results found.      Scheduled Meds: . atorvastatin  40 mg Oral Daily  . chlorhexidine  15 mL Mouth Rinse BID  . chlorthalidone  25 mg Oral Daily  . enoxaparin (LOVENOX) injection  40 mg Subcutaneous Q24H  . ferrous sulfate  325 mg Oral Daily  . insulin aspart protamine- aspart  20 Units Subcutaneous BID WC  . lisinopril  40 mg Oral Daily  . mouth rinse   15 mL Mouth Rinse q12n4p   Continuous Infusions: . sodium chloride Stopped (02/08/17 0105)  . dextrose 5 % and 0.45% NaCl 100 mL/hr at 02/08/17 0105  . insulin (NOVOLIN-R) infusion 4.1 Units/hr (02/08/17 0852)     LOS: 1 day     Jacquelin Hawking, MD [facility] 02/08/2017, 9:24 AM Pager: [phone]  If 7PM-7AM, please contact night-coverage [email or web address] Password [facility] 02/08/2017, 9:24 AM

## 2017-02-09 LAB — BASIC METABOLIC PANEL
ANION GAP: 18 — AB (ref 5–15)
Anion gap: 15 (ref 5–15)
Anion gap: 12 (ref 5–15)
Anion gap: 15 (ref 5–15)
Anion gap: 12 (ref 5–15)
BUN: 29 mg/dL — ABNORMAL HIGH (ref 6–20)
BUN: 27 mg/dL — ABNORMAL HIGH (ref 6–20)
BUN: 27 mg/dL — ABNORMAL HIGH (ref 6–20)
BUN: 23 mg/dL — ABNORMAL HIGH (ref 6–20)
BUN: 19 mg/dL (ref 6–20)
CALCIUM: 9.8 mg/dL (ref 8.9–10.3)
CHLORIDE: 110 mmol/L (ref 101–111)
CHLORIDE: 112 mmol/L — AB (ref 101–111)
CHLORIDE: 114 mmol/L — AB (ref 101–111)
CHLORIDE: 108 mmol/L (ref 101–111)
CO2: 19 mmol/L — AB (ref 22–32)
CO2: 20 mmol/L — ABNORMAL LOW (ref 22–32)
CO2: 23 mmol/L (ref 22–32)
CO2: 20 mmol/L — ABNORMAL LOW (ref 22–32)
CO2: 25 mmol/L (ref 22–32)
CREATININE: 1.21 mg/dL (ref 0.61–1.24)
CREATININE: 1.12 mg/dL (ref 0.61–1.24)
CREATININE: 1 mg/dL (ref 0.61–1.24)
CREATININE: 0.92 mg/dL (ref 0.61–1.24)
Calcium: 9.8 mg/dL (ref 8.9–10.3)
Calcium: 9.8 mg/dL (ref 8.9–10.3)
Calcium: 10 mg/dL (ref 8.9–10.3)
Calcium: 9.7 mg/dL (ref 8.9–10.3)
Chloride: 107 mmol/L (ref 101–111)
Creatinine, Ser: 1.2 mg/dL (ref 0.61–1.24)
GFR calc non Af Amer: >60 mL/min (ref >60)
GLUCOSE: 469 mg/dL — AB (ref 65–99)
Glucose, Bld: 465 mg/dL — ABNORMAL HIGH (ref 65–99)
Glucose, Bld: 357 mg/dL — ABNORMAL HIGH (ref 65–99)
Glucose, Bld: 161 mg/dL — ABNORMAL HIGH (ref 65–99)
Glucose, Bld: 138 mg/dL — ABNORMAL HIGH (ref 65–99)
POTASSIUM: 4.1 mmol/L (ref 3.5–5.1)
POTASSIUM: 4.2 mmol/L (ref 3.5–5.1)
POTASSIUM: 4.7 mmol/L (ref 3.5–5.1)
Potassium: 4.1 mmol/L (ref 3.5–5.1)
Potassium: 3.6 mmol/L (ref 3.5–5.1)
SODIUM: 145 mmol/L (ref 135–145)
SODIUM: 147 mmol/L — AB (ref 135–145)
SODIUM: 149 mmol/L — AB (ref 135–145)
SODIUM: 145 mmol/L (ref 135–145)
Sodium: 144 mmol/L (ref 135–145)

## 2017-02-09 LAB — GLUCOSE, CAPILLARY
GLUCOSE-CAPILLARY: 426 mg/dL — AB (ref 65–99)
GLUCOSE-CAPILLARY: 394 mg/dL — AB (ref 65–99)
GLUCOSE-CAPILLARY: 400 mg/dL — AB (ref 65–99)
GLUCOSE-CAPILLARY: 359 mg/dL — AB (ref 65–99)
GLUCOSE-CAPILLARY: 151 mg/dL — AB (ref 65–99)
GLUCOSE-CAPILLARY: 115 mg/dL — AB (ref 65–99)
Glucose-Capillary: 450 mg/dL — ABNORMAL HIGH (ref 65–99)
Glucose-Capillary: 411 mg/dL — ABNORMAL HIGH (ref 65–99)
Glucose-Capillary: 328 mg/dL — ABNORMAL HIGH (ref 65–99)
Glucose-Capillary: 215 mg/dL — ABNORMAL HIGH (ref 65–99)
Glucose-Capillary: 151 mg/dL — ABNORMAL HIGH (ref 65–99)
Glucose-Capillary: 142 mg/dL — ABNORMAL HIGH (ref 65–99)
Glucose-Capillary: 186 mg/dL — ABNORMAL HIGH (ref 65–99)

## 2017-02-09 MED ORDER — SODIUM CHLORIDE 0.9 % IV SOLN
INTRAVENOUS | Status: DC
  Administered 2017-02-09: 3.5 [IU]/h via INTRAVENOUS
  Filled 2017-02-09: qty 1

## 2017-02-09 MED ORDER — DEXTROSE-NACL 5-0.45 % IV SOLN
INTRAVENOUS | Status: DC
  Administered 2017-02-09: 18:00:00 via INTRAVENOUS

## 2017-02-09 MED ORDER — SODIUM CHLORIDE 0.9 % IV SOLN
INTRAVENOUS | Status: DC
  Administered 2017-02-09: 13:00:00 via INTRAVENOUS

## 2017-02-09 MED ORDER — INSULIN ASPART 100 UNIT/ML ~~LOC~~ SOLN: 0.0000 [IU] | Freq: Every day | SUBCUTANEOUS | Status: DC

## 2017-02-09 MED ORDER — POTASSIUM CHLORIDE 10 MEQ/100ML IV SOLN
10.0000 meq | INTRAVENOUS | Status: AC
  Administered 2017-02-09 (×2): 10 meq via INTRAVENOUS
  Filled 2017-02-09 (×2): qty 100

## 2017-02-09 MED ORDER — CHLORTHALIDONE 50 MG PO TABS
50.0000 mg | ORAL_TABLET | Freq: Every day | ORAL | Status: DC
  Administered 2017-02-09 – 2017-02-11 (×3): 50 mg via ORAL
  Filled 2017-02-09 (×3): qty 1

## 2017-02-09 MED ORDER — INSULIN ASPART PROT & ASPART (70-30 MIX) 100 UNIT/ML ~~LOC~~ SUSP
30.0000 [IU] | Freq: Two times a day (BID) | SUBCUTANEOUS | Status: DC
  Administered 2017-02-09: 30 [IU] via SUBCUTANEOUS

## 2017-02-09 MED ORDER — CLONIDINE HCL 0.1 MG PO TABS
0.1000 mg | ORAL_TABLET | Freq: Once | ORAL | Status: AC
  Administered 2017-02-09: 0.1 mg via ORAL
  Filled 2017-02-09: qty 1

## 2017-02-09 NOTE — Progress Notes (Addendum)
PT Cancellation Note  Patient Details Name: Mike [name] Stith MRN: [id number] DOB: [birth year]   Cancelled Treatment:    Reason Eval/Treat Not Completed: Medical issues which prohibited therapy. Pt with c/o nausea. He has the hiccups and is ill-appearing. Glucose 469 this AM at 0600. PT to re-attempt Rx as time allows.  Addendum 1326: Re-attempted PT Rx. Pt is now sitting in recliner. RN reports he has been up < 30 minutes. Pt continues to c/o nausea and unable to participate in further mobility. PT to progress with ambulation as med status improves.   Ilda FoilGarrow, Arbor Cohen Rene 02/09/2017, 10:26 AM

## 2017-02-09 NOTE — Progress Notes (Signed)
Patient CBGs  remained in mid 400's throughout night. RN has been in contact with provider several times over night, with insulin orders recieved. Patient CBGs remained elevated despite insulin. Provider contacted again, orders for BMP placed. RN awaiting further orders, and will continue to monitor.

## 2017-02-09 NOTE — Plan of Care (Signed)
Discussed plan of care for the night, pain, nausea, shaving and high blood pressures with some teach back displayed

## 2017-02-09 NOTE — Progress Notes (Signed)
PROGRESS NOTE    Mike [name]  RUE:[id number] DOB: 31-Jul-1960 DOA: 02/07/2017 PCP: [name], NP   Brief Narrative: Mike [name] is a 57 y.o. male with medical history significant of insulin-dependent diabetes, cognitive impairment, essential hypertension, stroke. He presented with hyperglycemia and found to be in DKA. He was started on an insulin drip and DKA has resolved.    Assessment & Plan:   Active Problems:   Homelessness   Type II diabetes mellitus with renal manifestations (HCC)   Protein-calorie malnutrition, severe   Essential hypertension   HLD (hyperlipidemia)   DKA (diabetic ketoacidoses) (HCC)   History of stroke   Malnutrition of moderate degree   DKA Insulin dependent diabetes mellitus Patient started on IV insulin with initial closure of anion gap. Blood sugar poorly controlled with regimen yesterday. Anion gap reopened and CO2 decreasing. -Discontinue subq insulin -Restart Insulin drip -heart healthy/carb modified diet -Keep in stepdown unit  Protein-calorie malnutrition Secondary to chronic disease and homelessness -Continue protein supplementation  Acute kidney injury Secondary to prerenal disease in setting of DKA and dehydration. Resolved.  History of stroke MRI from 07/2016 significant for severe chronic microvascular ischemic changes and severe parenchymal volume loss. Contributing to patient's cognitive impairment and lack of capacity -Continue Lipitor  Social issues Patient recently with prolonged admission secondary to discharge planning issues as patient has cognitive impairment and insulin dependent DM. Evaluated by psychiatry last admission and patient does not have capacity to manage his medical issues. -CSW consult  Essential hypertension Uncontrolled. -Continue chlorthalidone (increased to 50 mg) and lisinopril -Hydralazine IV prn while NPO   DVT prophylaxis: Lovenox Code Status: Full code Family  Communication: None at bedside Disposition Plan: Pending control of blood sugar and safe discharge plan   Consultants:   None  Procedures:   None  Antimicrobials:  None    Subjective: No concerns today. Hyperglycemia overnight.  Objective: Vitals:   02/08/17 2342 02/08/17 2357 02/09/17 0341 02/09/17 0442  BP:  (!) 148/74  (!) 194/92  Pulse:      Resp:      Temp: 99.2 F (37.3 C)  98.9 F (37.2 C)   TempSrc: Axillary  Oral   SpO2:      Weight:      Height:        Intake/Output Summary (Last 24 hours) at 02/09/2017 0645 Last data filed at 02/09/2017 0400 Gross per 24 hour  Intake 2817.37 ml  Output 2325 ml  Net 492.37 ml   Filed Weights   02/07/17 2000  Weight: 68.6 kg (151 lb 3.8 oz)    Examination:  General exam: Appears calm and comfortable Respiratory system: Clear to auscultation. Respiratory effort normal. Cardiovascular system: S1 & S2 heard, RRR. Gastrointestinal system: Abdomen is nondistended, soft and nontender. Normal bowel sounds heard. Central nervous system: Alert and oriented to person and place. No focal neurological deficits. Extremities: No edema. No calf tenderness Skin: No cyanosis. No rashes Psychiatry: Judgement and insight appear impaired. Flat affect.     Data Reviewed: I have personally reviewed following labs and imaging studies  CBC: Recent Labs  Lab 02/07/17 1458  WBC 8.3  NEUTROABS 5.7  HGB 12.7*  HCT 40.0  MCV 80.0  PLT 399   Basic Metabolic Panel: Recent Labs  Lab 02/07/17 1458 02/07/17 2002 02/07/17 2153 02/08/17 0225 02/08/17 0556 02/08/17 1003  NA 141 149* 151* 151* 150* 148*  K 5.1 4.2 3.7 3.9 3.7 3.8  CL 98* 112* 112* 115* 114* 111  CO2 21* 22 25 22 24 23   GLUCOSE 739* 366* 280* 210* 235* 202*  BUN 51* 42* 38* 31* 24* 21*  CREATININE 2.29* 1.72* 1.54* 1.26* 1.20 1.14  CALCIUM 9.6 8.6* 8.8* 9.1 9.0 9.3  MG 3.0*  --   --  2.6*  --   --   PHOS 5.5*  --   --  2.4*  --   --    GFR: Estimated  Creatinine Clearance: 71 mL/min (by C-G formula based on SCr of 1.14 mg/dL). Liver Function Tests: No results for input(s): AST, ALT, ALKPHOS, BILITOT, PROT, ALBUMIN in the last 168 hours. No results for input(s): LIPASE, AMYLASE in the last 168 hours. No results for input(s): AMMONIA in the last 168 hours. Coagulation Profile: No results for input(s): INR, PROTIME in the last 168 hours. Cardiac Enzymes: No results for input(s): CKTOTAL, CKMB, CKMBINDEX, TROPONINI in the last 168 hours. BNP (last 3 results) No results for input(s): PROBNP in the last 8760 hours. HbA1C: Recent Labs    02/07/17 2002  HGBA1C 10.9*   CBG: Recent Labs  Lab 02/08/17 1740 02/08/17 2021 02/08/17 2305 02/09/17 0345 02/09/17 0522  GLUCAP 415* 449* 456* 450* 426*   Lipid Profile: No results for input(s): CHOL, HDL, LDLCALC, TRIG, CHOLHDL, LDLDIRECT in the last 72 hours. Thyroid Function Tests: No results for input(s): TSH, T4TOTAL, FREET4, T3FREE, THYROIDAB in the last 72 hours. Anemia Panel: No results for input(s): VITAMINB12, FOLATE, FERRITIN, TIBC, IRON, RETICCTPCT in the last 72 hours. Sepsis Labs: No results for input(s): PROCALCITON, LATICACIDVEN in the last 168 hours.  Recent Results (from the past 240 hour(s))  MRSA PCR Screening     Status: None   Collection Time: 02/07/17  8:29 PM  Result Value Ref Range Status   MRSA by PCR NEGATIVE NEGATIVE Final    Comment:        The GeneXpert MRSA Assay (FDA approved for NASAL specimens only), is one component of a comprehensive MRSA colonization surveillance program. It is not intended to diagnose MRSA infection nor to guide or monitor treatment for MRSA infections.          Radiology Studies: No results found.      Scheduled Meds: . atorvastatin  40 mg Oral Daily  . chlorhexidine  15 mL Mouth Rinse BID  . chlorthalidone  25 mg Oral Daily  . enoxaparin (LOVENOX) injection  40 mg Subcutaneous Q24H  . feeding supplement  (ENSURE ENLIVE)  237 mL Oral TID BM  . ferrous sulfate  325 mg Oral Daily  . insulin aspart  0-9 Units Subcutaneous QAC lunch  . insulin aspart protamine- aspart  30 Units Subcutaneous BID WC  . lisinopril  40 mg Oral Daily  . mouth rinse  15 mL Mouth Rinse q12n4p   Continuous Infusions: . sodium chloride 125 mL/hr at 02/09/17 0454     LOS: 2 days     Jacquelin Hawkingalph Bayne Fosnaugh, MD [facility] 02/09/2017, 6:45 AM Pager: [phone]  If 7PM-7AM, please contact night-coverage [email or web address] Password [facility] 02/09/2017, 6:45 AM

## 2017-02-09 NOTE — Progress Notes (Signed)
02/09/2017 Patient Blood sugar at 1116 was 411. Dr Caleb PoppNettey was made aware. Orders were given to start insulin drip. [facility] Jackson[facility]adine Malu Pellegrini RN.

## 2017-02-10 ENCOUNTER — Inpatient Hospital Stay (HOSPITAL_COMMUNITY): Payer: Self-pay

## 2017-02-10 DIAGNOSIS — I10 Essential (primary) hypertension: Secondary | ICD-10-CM

## 2017-02-10 LAB — BASIC METABOLIC PANEL
ANION GAP: 13 (ref 5–15)
ANION GAP: 15 (ref 5–15)
BUN: 19 mg/dL (ref 6–20)
BUN: 21 mg/dL — AB (ref 6–20)
CALCIUM: 9.4 mg/dL (ref 8.9–10.3)
CHLORIDE: 108 mmol/L (ref 101–111)
CO2: 23 mmol/L (ref 22–32)
CO2: 22 mmol/L (ref 22–32)
Calcium: 9.8 mg/dL (ref 8.9–10.3)
Chloride: 105 mmol/L (ref 101–111)
Creatinine, Ser: 1.06 mg/dL (ref 0.61–1.24)
Creatinine, Ser: 1.11 mg/dL (ref 0.61–1.24)
GFR calc Af Amer: >60 mL/min (ref >60)
GFR calc non Af Amer: >60 mL/min (ref >60)
GLUCOSE: 325 mg/dL — AB (ref 65–99)
Glucose, Bld: 202 mg/dL — ABNORMAL HIGH (ref 65–99)
POTASSIUM: 3.5 mmol/L (ref 3.5–5.1)
Potassium: 4.1 mmol/L (ref 3.5–5.1)
SODIUM: 144 mmol/L (ref 135–145)
SODIUM: 142 mmol/L (ref 135–145)

## 2017-02-10 LAB — GLUCOSE, CAPILLARY
GLUCOSE-CAPILLARY: 135 mg/dL — AB (ref 65–99)
GLUCOSE-CAPILLARY: 164 mg/dL — AB (ref 65–99)
GLUCOSE-CAPILLARY: 167 mg/dL — AB (ref 65–99)
GLUCOSE-CAPILLARY: 283 mg/dL — AB (ref 65–99)
GLUCOSE-CAPILLARY: 352 mg/dL — AB (ref 65–99)
GLUCOSE-CAPILLARY: 360 mg/dL — AB (ref 65–99)
Glucose-Capillary: 296 mg/dL — ABNORMAL HIGH (ref 65–99)
Glucose-Capillary: 342 mg/dL — ABNORMAL HIGH (ref 65–99)
Glucose-Capillary: 342 mg/dL — ABNORMAL HIGH (ref 65–99)
Glucose-Capillary: 370 mg/dL — ABNORMAL HIGH (ref 65–99)

## 2017-02-10 LAB — ECHOCARDIOGRAM COMPLETE
Height: 76 in
WEIGHTICAEL: 2419.77 [oz_av]

## 2017-02-10 MED ORDER — INSULIN ASPART 100 UNIT/ML ~~LOC~~ SOLN: 0.0000 [IU] | SUBCUTANEOUS | Status: DC

## 2017-02-10 MED ORDER — INSULIN GLARGINE 100 UNIT/ML ~~LOC~~ SOLN
30.0000 [IU] | Freq: Every day | SUBCUTANEOUS | Status: DC
  Administered 2017-02-10: 30 [IU] via SUBCUTANEOUS
  Filled 2017-02-10 (×2): qty 0.3

## 2017-02-10 MED ORDER — GLUCERNA SHAKE PO LIQD
237.0000 mL | Freq: Three times a day (TID) | ORAL | Status: DC
  Administered 2017-02-11 – 2017-02-13 (×8): 237 mL via ORAL

## 2017-02-10 MED ORDER — INSULIN ASPART PROT & ASPART (70-30 MIX) 100 UNIT/ML ~~LOC~~ SUSP: 30.0000 [IU] | Freq: Two times a day (BID) | SUBCUTANEOUS | Status: DC

## 2017-02-10 MED ORDER — SODIUM CHLORIDE 0.9 % IV SOLN
INTRAVENOUS | Status: DC
  Administered 2017-02-10 – 2017-02-11 (×3): via INTRAVENOUS

## 2017-02-10 MED ORDER — DEXTROSE-NACL 5-0.45 % IV SOLN: INTRAVENOUS | Status: DC

## 2017-02-10 MED ORDER — INSULIN ASPART 100 UNIT/ML ~~LOC~~ SOLN
0.0000 [IU] | Freq: Every day | SUBCUTANEOUS | Status: DC
Start: 2017-02-10 — End: 2017-02-13
  Administered 2017-02-10: 5 [IU] via SUBCUTANEOUS

## 2017-02-10 MED ORDER — INSULIN ASPART 100 UNIT/ML ~~LOC~~ SOLN
0.0000 [IU] | Freq: Three times a day (TID) | SUBCUTANEOUS | Status: DC
  Administered 2017-02-10: 8 [IU] via SUBCUTANEOUS

## 2017-02-10 MED ORDER — SODIUM CHLORIDE 0.9 % IV SOLN
INTRAVENOUS | Status: DC
  Administered 2017-02-10: 2.8 [IU]/h via INTRAVENOUS
  Filled 2017-02-10: qty 1

## 2017-02-10 NOTE — Social Work (Addendum)
CSW has spoken with pt regarding discharge planning and to inquire about potential supports that pt has.   Pt was recently hospitalized for a prolonged period in November/December 2018. While in the community pt does not manage his diabetes and comes into [facility] facilities when he does not feel well. Pt does not have any income or insurance, pt food stamp eligibility has lapsed, [place] and does not have the ability to receive disability currently. Ability to receive benefits may become possible when pt has had green card for 5 years.   Pt has connections with various community organizations and individuals that have been part of care planning these include:  APS Case Worker (case closed)- Myrtis SerAdrian Carmichael [phone] DSS Case Worker (for pt children)- Kathe Bectonamela Bryant [phone] [facility] NP- Nita SellsMaryAnn [name] [phone]  When pt was previously admitted he was seen by MD Sharma CovertNorman from psychiatry (see note from Juanetta BeetsJacqueline Norman, DO, 11/30/2016) and deemed to not have any psychiatric needs, but was not competently managing his diabetes and insulin regimen. [facility] but according to [facility] [name] when contacted during last admission pt does not come in for appointments (see note from Keene BreathPatricia Pencil, CSW, 12/05/2016)  Pt had an APS worker assigned who explored options for pt to be taken care of in the community. With no income pt is not eligible for group home or family care home placement. Pt citizenship status also makes it difficult for him to receive benefits as outlined above. APS worker Myrtis Serdrian Carmichael closed the case and is no longer working with pt. (see note from Stacy Gardnerrin Davenport, CSW, 01/26/2017)  With no insurance pt was difficult to place and was placed in a SNF in [place] on a 30 day LOG. During that time pt did not qualify for Medicaid. This means that pt does not have a payer sources for placement at SNF.   Pt states that he  currently has no supports that he could discharge to, and has been connected with all resources to help him manage his diabetes. If no further supports are identified at time of discharge pt may have to discharge back into community.   CSW has spoken with Chiropodistassistant director Wandra MannanZack Brooks, CSW, and UnitedHealthdirector Hope Rife, CSW about case. Dr. Geoffry Paradiseichard Aronson also aware of pt admission and needs. CSW actively working to find best possible discharge plan.   Doy HutchingIsabel H Christoph Copelan, LCSWA [facility] Clinical Social Work [phone]

## 2017-02-10 NOTE — Plan of Care (Signed)
Discussed with patient importance of keeping his blood sugars down with some teach back displayed

## 2017-02-10 NOTE — Clinical Social Work Note (Signed)
Clinical Social Work Assessment  Patient Details  Name: Mike [name] MRN: [id number] Date of Birth: [birth year]  Date of referral:  02/10/17               Reason for consult:  Facility Placement, Housing Concerns/Homelessness, Capacity, Care Management Concerns, Discharge Planning, [employer]                Permission sought to share information with:   Architect granted to share information::  No  Housing/Transportation Living arrangements for the past 2 months:  [place], No permanent address, Barrister's clerk of Information:  Patient Patient Interpreter Needed:  None Criminal Activity/Legal Involvement Pertinent to Current Situation/Hospitalization:  No - Comment as needed Significant Relationships:  Warehouse manager, Dependent Children, Other(Comment)(Mother of Children) Lives with:  Self Do you feel safe going back to the place where you live?  Yes Need for family participation in patient care:  Yes (Comment)(insulin management)  Care giving concerns: Pt is homeless and does not manage his diabetes in the community. Pt has no supports that he identifies and does not access community supports available for management of chronic health conditions (diabetes).  Pt does not have insurance and does not qualify for disability or Medicaid at current citizenship/health status.    Social Worker assessment / plan:  CSW met with pt at bedside, reintroduced self and role to pt- CSW previously worked with pt during last hospitalization. Pt hospitalized due to uncontrolled diabetes. Pt frequent admit at [facility] facilities. Pt was previously placed at SNF in [place] on a 30 day LOG, pt states he was okay with that arrangement, and after discharge was brought to [place]. Pt states that he stays at homeless shelters and out on the streets. Pt is aware of [facility] and could access care at [facility] with NP but does not come to appointments or access  care there. Pt states that he sometimes speaks with the mother of his two young children (4 years and 5 months) but that he cannot stay with her nor has support from her. Pt states no family in the [place] or in his home country of [place]. CSW continuing to follow to try and support safe discharge plan.   Employment status:  Unemployed Forensic scientist:  Self Pay (Medicaid Pending) PT Recommendations:  No Follow Up Information / Referral to community resources:  Other (Comment Required), Shelter([facility])  Patient/Family's Response to care: Pt amenable to any support, understands CSW role and states understanding of the importance of accessing any resources given to him at discharge.   Patient/Family's Understanding of and Emotional Response to Diagnosis, Current Treatment, and Prognosis:  Pt states understanding of diagnosis, current treatment and prognosis if he does not access resources to manage diabetes appropriately. Pt is quiet and unemotional when interacting with CSW, does not expand on many answers to questions.   Emotional Assessment Appearance:  Appears older than stated age Attitude/Demeanor/Rapport:  Guarded, Other(Quiet) Affect (typically observed):  Quiet, Withdrawn, Adaptable Orientation:  Oriented to Self, Oriented to Place, Oriented to  Time, Oriented to Situation Alcohol / Substance use:  Tobacco Use, Alcohol Use Psych involvement (Current and /or in the community):  No (Comment)  Discharge Needs  Concerns to be addressed:  Basic Needs, Financial / Insurance Concerns, Discharge Planning Concerns, Lack of Support, Care Coordination, Compliance Issues Concerns Readmission within the last 30 days:  Yes Current discharge risk:  Chronically ill, Homeless, Lack of support system, Inadequate Financial Supports Barriers to Discharge:  Continued Medical Work up, No SNF bed, Homeless with medical needs, Inadequate or no insurance   [place], [place] 02/10/2017, 2:39 PM

## 2017-02-10 NOTE — Progress Notes (Signed)
PROGRESS NOTE    Mike [name]  WUJ:[id number] DOB: May 11, 1960 DOA: 02/07/2017 PCP: [name], NP   Brief Narrative: Mike [name] is a 57 y.o. male with medical history significant of insulin-dependent diabetes, cognitive impairment, essential hypertension, stroke. He presented with hyperglycemia and found to be in DKA. He was started on an insulin drip and DKA has resolved.    Assessment & Plan:   Active Problems:   Homelessness   Type II diabetes mellitus with renal manifestations (HCC)   Protein-calorie malnutrition, severe   Essential hypertension   HLD (hyperlipidemia)   DKA (diabetic ketoacidoses) (HCC)   History of stroke   Malnutrition of moderate degree   DKA Insulin dependent diabetes mellitus Patient started on IV insulin with initial closure of anion gap. Blood sugar poorly controlled with regimen yesterday. Anion gap reopened and CO2 decreasing. -Start Lantus 30 units daily -SSI moderate once insulin drip discontinued -Discontinue insulin drip 2 hours after subcutaneous insulin -heart healthy/carb modified diet  Murmur Last Transthoracic Echocardiogram without valvular abnormalities -Echocardiogram  Protein-calorie malnutrition, moderate Secondary to chronic disease and homelessness -Continue protein supplementation  Acute kidney injury Secondary to prerenal disease in setting of DKA and dehydration. Resolved.  History of stroke MRI from 07/2016 significant for severe chronic microvascular ischemic changes and severe parenchymal volume loss. Contributing to patient's cognitive impairment and lack of capacity -Continue Lipitor  Social issues Patient recently with prolonged admission secondary to discharge planning issues as patient has cognitive impairment and insulin dependent DM. Evaluated by psychiatry last admission and patient does not have capacity to manage his medical issues. -CSW consult  Essential  hypertension Uncontrolled. -Continue chlorthalidone (increased to 50 mg) and lisinopril -Hydralazine IV prn while NPO   DVT prophylaxis: Lovenox Code Status: Full code Family Communication: None at bedside Disposition Plan: Pending control of blood sugar and safe discharge plan   Consultants:   None  Procedures:   None  Antimicrobials:  None    Subjective: Nausea and vomiting overnight. Feels slightly better today.  Objective: Vitals:   02/10/17 0348 02/10/17 0400 02/10/17 0500 02/10/17 0600  BP:  (!) 175/72 (!) 146/81 116/66  Pulse: 72 90 89 93  Resp: 16 16 14 18   Temp:      TempSrc:      SpO2: 100% 100% 100% 99%  Weight:      Height:        Intake/Output Summary (Last 24 hours) at 02/10/2017 0716 Last data filed at 02/10/2017 0445 Gross per 24 hour  Intake 2301.58 ml  Output 2375 ml  Net -73.42 ml   Filed Weights   02/07/17 2000  Weight: 68.6 kg (151 lb 3.8 oz)    Examination:  General exam: Appears calm and comfortable Respiratory system: Clear to auscultation bilaterally. Unlabored work of breathing. No wheezing or rales. Cardiovascular system: S1 & S2 heard, RRR. Gastrointestinal system: Abdomen is nondistended, soft and nontender. Normal bowel sounds heard. Central nervous system: Alert and oriented to person and place. No focal neurological deficits. Extremities: No edema. No calf tenderness Skin: No cyanosis. No rashes Psychiatry: Judgement and insight appear impaired. Flat affect.     Data Reviewed: I have personally reviewed following labs and imaging studies  CBC: Recent Labs  Lab 02/07/17 1458  WBC 8.3  NEUTROABS 5.7  HGB 12.7*  HCT 40.0  MCV 80.0  PLT 399   Basic Metabolic Panel: Recent Labs  Lab 02/07/17 1458  02/08/17 0225  02/09/17 1213 02/09/17 1536 02/09/17 1959 02/09/17 2305 02/10/17  0454  NA 141   < > 151*   < > 145 147* 149* 145 144  K 5.1   < > 3.9   < > 4.1 4.2 4.7 3.6 3.5  CL 98*   < > 115*   < > 110 112*  114* 108 108  CO2 21*   < > 22   < > 20* 23 20* 25 23  GLUCOSE 739*   < > 210*   < > 465* 357* 161* 138* 202*  BUN 51*   < > 31*   < > 27* 27* 23* 19 19  CREATININE 2.29*   < > 1.26*   < > 1.20 1.12 1.00 0.92 1.06  CALCIUM 9.6   < > 9.1   < > 9.8 9.8 10.0 9.7 9.8  MG 3.0*  --  2.6*  --   --   --   --   --   --   PHOS 5.5*  --  2.4*  --   --   --   --   --   --    < > = values in this interval not displayed.   GFR: Estimated Creatinine Clearance: 76.4 mL/min (by C-G formula based on SCr of 1.06 mg/dL). Liver Function Tests: No results for input(s): AST, ALT, ALKPHOS, BILITOT, PROT, ALBUMIN in the last 168 hours. No results for input(s): LIPASE, AMYLASE in the last 168 hours. No results for input(s): AMMONIA in the last 168 hours. Coagulation Profile: No results for input(s): INR, PROTIME in the last 168 hours. Cardiac Enzymes: No results for input(s): CKTOTAL, CKMB, CKMBINDEX, TROPONINI in the last 168 hours. BNP (last 3 results) No results for input(s): PROBNP in the last 8760 hours. HbA1C: Recent Labs    02/07/17 2002  HGBA1C 10.9*   CBG: Recent Labs  Lab 02/09/17 2210 02/09/17 2310 02/10/17 0026 02/10/17 0308 02/10/17 0420  GLUCAP 151* 115* 135* 164* 167*   Lipid Profile: No results for input(s): CHOL, HDL, LDLCALC, TRIG, CHOLHDL, LDLDIRECT in the last 72 hours. Thyroid Function Tests: No results for input(s): TSH, T4TOTAL, FREET4, T3FREE, THYROIDAB in the last 72 hours. Anemia Panel: No results for input(s): VITAMINB12, FOLATE, FERRITIN, TIBC, IRON, RETICCTPCT in the last 72 hours. Sepsis Labs: No results for input(s): PROCALCITON, LATICACIDVEN in the last 168 hours.  Recent Results (from the past 240 hour(s))  MRSA PCR Screening     Status: None   Collection Time: 02/07/17  8:29 PM  Result Value Ref Range Status   MRSA by PCR NEGATIVE NEGATIVE Final    Comment:        The GeneXpert MRSA Assay (FDA approved for NASAL specimens only), is one component of  a comprehensive MRSA colonization surveillance program. It is not intended to diagnose MRSA infection nor to guide or monitor treatment for MRSA infections.          Radiology Studies: No results found.      Scheduled Meds: . atorvastatin  40 mg Oral Daily  . chlorhexidine  15 mL Mouth Rinse BID  . chlorthalidone  50 mg Oral Daily  . feeding supplement (ENSURE ENLIVE)  237 mL Oral TID BM  . ferrous sulfate  325 mg Oral Daily  . insulin aspart protamine- aspart  30 Units Subcutaneous BID WC  . lisinopril  40 mg Oral Daily  . mouth rinse  15 mL Mouth Rinse q12n4p   Continuous Infusions:    LOS: 3 days     Jacquelin Hawking, MD  [facility] 02/10/2017, 7:16 AM Pager: [phone]  If 7PM-7AM, please contact night-coverage [email or web address] Password [facility] 02/10/2017, 7:16 AM

## 2017-02-11 ENCOUNTER — Inpatient Hospital Stay (HOSPITAL_COMMUNITY): Payer: Self-pay

## 2017-02-11 LAB — GLUCOSE, CAPILLARY
GLUCOSE-CAPILLARY: 334 mg/dL — AB (ref 65–99)
GLUCOSE-CAPILLARY: 51 mg/dL — AB (ref 65–99)
GLUCOSE-CAPILLARY: 252 mg/dL — AB (ref 65–99)
Glucose-Capillary: 328 mg/dL — ABNORMAL HIGH (ref 65–99)
Glucose-Capillary: 233 mg/dL — ABNORMAL HIGH (ref 65–99)
Glucose-Capillary: 194 mg/dL — ABNORMAL HIGH (ref 65–99)

## 2017-02-11 LAB — BASIC METABOLIC PANEL
ANION GAP: 11 (ref 5–15)
BUN: 20 mg/dL (ref 6–20)
CALCIUM: 9.4 mg/dL (ref 8.9–10.3)
CO2: 24 mmol/L (ref 22–32)
CREATININE: 1 mg/dL (ref 0.61–1.24)
Chloride: 106 mmol/L (ref 101–111)
GLUCOSE: 325 mg/dL — AB (ref 65–99)
Potassium: 3.6 mmol/L (ref 3.5–5.1)
Sodium: 141 mmol/L (ref 135–145)

## 2017-02-11 LAB — PROCALCITONIN

## 2017-02-11 MED ORDER — INSULIN ASPART 100 UNIT/ML ~~LOC~~ SOLN
0.0000 [IU] | Freq: Three times a day (TID) | SUBCUTANEOUS | Status: DC
  Administered 2017-02-11 (×2): 7 [IU] via SUBCUTANEOUS
  Administered 2017-02-11: 11 [IU] via SUBCUTANEOUS
  Administered 2017-02-12: 15 [IU] via SUBCUTANEOUS

## 2017-02-11 MED ORDER — CHLORTHALIDONE 50 MG PO TABS
100.0000 mg | ORAL_TABLET | Freq: Every day | ORAL | Status: DC
  Administered 2017-02-12 – 2017-02-13 (×2): 100 mg via ORAL
  Filled 2017-02-11 (×2): qty 2

## 2017-02-11 MED ORDER — INSULIN ASPART 100 UNIT/ML ~~LOC~~ SOLN
5.0000 [IU] | Freq: Once | SUBCUTANEOUS | Status: AC
  Administered 2017-02-11: 5 [IU] via SUBCUTANEOUS

## 2017-02-11 MED ORDER — INSULIN GLARGINE 100 UNIT/ML ~~LOC~~ SOLN
35.0000 [IU] | Freq: Every day | SUBCUTANEOUS | Status: DC
  Administered 2017-02-11: 35 [IU] via SUBCUTANEOUS
  Filled 2017-02-11 (×2): qty 0.35

## 2017-02-11 NOTE — Plan of Care (Signed)
Patient OOB, stand and pivot with 2 assist to [name] and chair. Patient vss and no c/o pain or distress.

## 2017-02-11 NOTE — Progress Notes (Signed)
PROGRESS NOTE    Mike [name]  ZOX:[id number] DOB: Sep 02, 1960 DOA: 02/07/2017 PCP: [name], [name], NP   Brief Narrative: Mike [name] is a 57 y.o. male with medical history significant of insulin-dependent diabetes, cognitive impairment, essential hypertension, stroke. He presented with hyperglycemia and found to be in DKA. He was started on an insulin drip and DKA has resolved.    Assessment & Plan:   Active Problems:   Homelessness   Type II diabetes mellitus with renal manifestations (HCC)   Protein-calorie malnutrition, severe   Essential hypertension   HLD (hyperlipidemia)   DKA (diabetic ketoacidoses) (HCC)   History of stroke   Malnutrition of moderate degree   DKA Insulin dependent diabetes mellitus Patient started on IV insulin with initial closure of anion gap. Blood sugar poorly controlled with regimen yesterday. Anion gap reopened and CO2 decreasing. -Increase to Lantus 35 units daily -increase to SSI cevere scale -heart healthy/carb modified diet -Zofran prn for nausea  Dyspnea Patient with some mild dyspnea and cough, concerning for pneumonia. Patient with recent prolonged hospital stay. -Procalcitonin -Chest x-ray -Sputum culture  Grade 1 diastolic dysfunction No heart failure.  Protein-calorie malnutrition, moderate Secondary to chronic disease and homelessness -Continue protein supplementation  Acute kidney injury Secondary to prerenal disease in setting of DKA and dehydration. Resolved.  History of stroke MRI from 07/2016 significant for severe chronic microvascular ischemic changes and severe parenchymal volume loss. Contributing to patient's cognitive impairment and lack of capacity -Continue Lipitor  Social issues Patient recently with prolonged admission secondary to discharge planning issues as patient has cognitive impairment and insulin dependent DM. Evaluated by psychiatry last admission and patient does not have capacity to  manage his medical issues. -CSW/CM consulted  Essential hypertension Uncontrolled. -Continue chlorthalidone (increased to 100 mg) and lisinopril -Hydralazine IV prn   DVT prophylaxis: Lovenox Code Status: Full code Family Communication: None at bedside Disposition Plan: Pending control of blood sugar and safe discharge plan   Consultants:   None  Procedures:   Transthoracic Echocardiogram (02/10/2017) Study Conclusions  - Left ventricle: The cavity size was normal. Wall thickness was   increased in a pattern of moderate to severe LVH. Systolic   function was vigorous. The estimated ejection fraction was in the   range of 65% to 70%. Mild intracavitary gradient peak 26 mmHg.   Wall motion was normal; there were no regional wall motion   abnormalities. Doppler parameters are consistent with abnormal   left ventricular relaxation (grade 1 diastolic dysfunction). - Aortic valve: Valve area (VTI): 3.12 cm^2. Valve area (Vmax):   3.19 cm^2. Valve area (Vmean): 2.86 cm^2.  Antimicrobials:  None    Subjective: Nausea and vomiting overnight. Feels slightly better today.  Objective: Vitals:   02/11/17 0600 02/11/17 0700 02/11/17 0752 02/11/17 0816  BP: (!) 166/79 131/74  (!) 183/81  Pulse: (!) 103 99    Resp: (!) 0 17    Temp:   (!) 97.3 F (36.3 C)   TempSrc:   Oral   SpO2: 98% 98%    Weight:      Height:        Intake/Output Summary (Last 24 hours) at 02/11/2017 0913 Last data filed at 02/11/2017 0600 Gross per 24 hour  Intake 3181 ml  Output 2100 ml  Net 1081 ml   Filed Weights   02/07/17 2000  Weight: 68.6 kg (151 lb 3.8 oz)    Examination:  General exam: Appears calm and comfortable Respiratory system: Clear to auscultation bilaterally.  Unlabored work of breathing. No wheezing or rales. Diminished bilaterally Cardiovascular system: S1 & S2 heard, RRR. Gastrointestinal system: Abdomen is nondistended, soft and nontender. Normal bowel sounds  heard. Central nervous system: Alert and oriented to person and place. No focal neurological deficits. Extremities: No edema. No calf tenderness Skin: No cyanosis. No rashes Psychiatry: Judgement and insight appear impaired. Flat affect.     Data Reviewed: I have personally reviewed following labs and imaging studies  CBC: Recent Labs  Lab 02/07/17 1458  WBC 8.3  NEUTROABS 5.7  HGB 12.7*  HCT 40.0  MCV 80.0  PLT 399   Basic Metabolic Panel: Recent Labs  Lab 02/07/17 1458  02/08/17 0225  02/09/17 1959 02/09/17 2305 02/10/17 0454 02/10/17 1659 02/11/17 0330  NA 141   < > 151*   < > 149* 145 144 142 141  K 5.1   < > 3.9   < > 4.7 3.6 3.5 4.1 3.6  CL 98*   < > 115*   < > 114* 108 108 105 106  CO2 21*   < > 22   < > 20* 25 23 22 24   GLUCOSE 739*   < > 210*   < > 161* 138* 202* 325* 325*  BUN 51*   < > 31*   < > 23* 19 19 21* 20  CREATININE 2.29*   < > 1.26*   < > 1.00 0.92 1.06 1.11 1.00  CALCIUM 9.6   < > 9.1   < > 10.0 9.7 9.8 9.4 9.4  MG 3.0*  --  2.6*  --   --   --   --   --   --   PHOS 5.5*  --  2.4*  --   --   --   --   --   --    < > = values in this interval not displayed.   GFR: Estimated Creatinine Clearance: 81 mL/min (by C-G formula based on SCr of 1 mg/dL). Liver Function Tests: No results for input(s): AST, ALT, ALKPHOS, BILITOT, PROT, ALBUMIN in the last 168 hours. No results for input(s): LIPASE, AMYLASE in the last 168 hours. No results for input(s): AMMONIA in the last 168 hours. Coagulation Profile: No results for input(s): INR, PROTIME in the last 168 hours. Cardiac Enzymes: No results for input(s): CKTOTAL, CKMB, CKMBINDEX, TROPONINI in the last 168 hours. BNP (last 3 results) No results for input(s): PROBNP in the last 8760 hours. HbA1C: No results for input(s): HGBA1C in the last 72 hours. CBG: Recent Labs  Lab 02/10/17 1758 02/10/17 2130 02/10/17 2358 02/11/17 0410 02/11/17 0754  GLUCAP 283* 360* 342* 328* 252*   Lipid Profile: No  results for input(s): CHOL, HDL, LDLCALC, TRIG, CHOLHDL, LDLDIRECT in the last 72 hours. Thyroid Function Tests: No results for input(s): TSH, T4TOTAL, FREET4, T3FREE, THYROIDAB in the last 72 hours. Anemia Panel: No results for input(s): VITAMINB12, FOLATE, FERRITIN, TIBC, IRON, RETICCTPCT in the last 72 hours. Sepsis Labs: No results for input(s): PROCALCITON, LATICACIDVEN in the last 168 hours.  Recent Results (from the past 240 hour(s))  MRSA PCR Screening     Status: None   Collection Time: 02/07/17  8:29 PM  Result Value Ref Range Status   MRSA by PCR NEGATIVE NEGATIVE Final    Comment:        The GeneXpert MRSA Assay (FDA approved for NASAL specimens only), is one component of a comprehensive MRSA colonization surveillance program. It is not intended to  diagnose MRSA infection nor to guide or monitor treatment for MRSA infections.          Radiology Studies: No results found.      Scheduled Meds: . atorvastatin  40 mg Oral Daily  . chlorhexidine  15 mL Mouth Rinse BID  . [START ON 02/12/2017] chlorthalidone  100 mg Oral Daily  . feeding supplement (GLUCERNA SHAKE)  237 mL Oral TID BM  . ferrous sulfate  325 mg Oral Daily  . insulin aspart  0-20 Units Subcutaneous TID WC  . insulin aspart  0-5 Units Subcutaneous QHS  . insulin glargine  35 Units Subcutaneous Daily  . lisinopril  40 mg Oral Daily  . mouth rinse  15 mL Mouth Rinse q12n4p   Continuous Infusions:    LOS: 4 days     Jacquelin Hawking, MD [facility] 02/11/2017, 9:13 AM Pager: [phone]  If 7PM-7AM, please contact night-coverage [email or web address] Password [facility] 02/11/2017, 9:13 AM

## 2017-02-11 NOTE — Progress Notes (Signed)
Paged the on-call MD with patient's recheck blood sugar of 342 after bedtime blood sugar of 360 with coverage.  Will continue to monitor.

## 2017-02-11 NOTE — Progress Notes (Signed)
Paged the on-call MD with patient's recheck blood sugar 342.  Will continue to monitor

## 2017-02-12 LAB — BASIC METABOLIC PANEL
Anion gap: 12 (ref 5–15)
BUN: 16 mg/dL (ref 6–20)
CALCIUM: 9.2 mg/dL (ref 8.9–10.3)
CHLORIDE: 102 mmol/L (ref 101–111)
CO2: 25 mmol/L (ref 22–32)
CREATININE: 0.92 mg/dL (ref 0.61–1.24)
GFR calc Af Amer: >60 mL/min (ref >60)
GFR calc non Af Amer: >60 mL/min (ref >60)
Glucose, Bld: 253 mg/dL — ABNORMAL HIGH (ref 65–99)
Potassium: 3.2 mmol/L — ABNORMAL LOW (ref 3.5–5.1)
SODIUM: 139 mmol/L (ref 135–145)

## 2017-02-12 LAB — GLUCOSE, CAPILLARY
GLUCOSE-CAPILLARY: 305 mg/dL — AB (ref 65–99)
GLUCOSE-CAPILLARY: 131 mg/dL — AB (ref 65–99)
Glucose-Capillary: 245 mg/dL — ABNORMAL HIGH (ref 65–99)
Glucose-Capillary: 317 mg/dL — ABNORMAL HIGH (ref 65–99)
Glucose-Capillary: 92 mg/dL (ref 65–99)

## 2017-02-12 LAB — STREP PNEUMONIAE URINARY ANTIGEN: Strep Pneumo Urinary Antigen: NEGATIVE

## 2017-02-12 MED ORDER — INSULIN ASPART 100 UNIT/ML ~~LOC~~ SOLN
0.0000 [IU] | Freq: Three times a day (TID) | SUBCUTANEOUS | Status: DC
  Administered 2017-02-12: 15 [IU] via SUBCUTANEOUS
  Administered 2017-02-13 (×2): 4 [IU] via SUBCUTANEOUS
  Administered 2017-02-13: 11 [IU] via SUBCUTANEOUS

## 2017-02-12 MED ORDER — INSULIN GLARGINE 100 UNIT/ML ~~LOC~~ SOLN
40.0000 [IU] | Freq: Every day | SUBCUTANEOUS | Status: DC
  Administered 2017-02-12 – 2017-02-13 (×2): 40 [IU] via SUBCUTANEOUS
  Filled 2017-02-12 (×2): qty 0.4

## 2017-02-12 NOTE — Progress Notes (Signed)
Inpatient Diabetes Program Recommendations  AACE/ADA: New Consensus Statement on Inpatient Glycemic Control (2015)  Target Ranges:  Prepandial:   less than 140 mg/dL      Peak postprandial:   less than 180 mg/dL (1-2 hours)      Critically ill patients:  140 - 180 mg/dL   Lab Results  Component Value Date   GLUCAP 305 (H) 02/12/2017   HGBA1C 10.9 (H) 02/07/2017   Review of Glycemic Control  Diabetes history: DM 2 Outpatient Diabetes medications: 70/30 30 units BID Current orders for Inpatient glycemic control: Lantus 40 units, Novolog Resistant 0-20 units tid + Novolog HS scale 0-5 units  Inpatient Diabetes Program Recommendations:    Patients 70/30 dose at home 30 units BID equivalent to 42 units of basal insulin and 6 units tid of meal coverage.   Agree with lantus increase. Also consider Novolog 4-5 units tid meal coverage if patient consumes at least 50% of meals and glucose is at least 80 mg/dl. Note patient on meal supplement which also may be contributing to glucose spikes.  Thanks,  Mike DeemShannon Alexus Michael RN, MSN, [facility] Inpatient Diabetes Coordinator Team Pager [phone] (8a-5p)

## 2017-02-12 NOTE — Progress Notes (Addendum)
Physical Therapy Treatment Patient Details Name: Mike [name] MRN: [id number] DOB: [birth year] Today's Date: 02/12/2017    History of Present Illness Pt is a 57 y/o homeless male with a PMH significant for medical noncompliance, HTN, depression, anxiety, seizure, CKD and DM. Pt has had 24 ED visits and 7 admissions in the past 6 months. He is currently admitted for uncontrolled DM and HTN.     PT Comments    Pt with decreased balance when increased activity is observed.  Several LOB noted in standing and during gait training.  Pt is not safe to return to current living situation with his functional deficits.  Plan next session for gait training with device to see if balance improves.  At this time SNF remains the safest option will inform supervising PT of need for change in recommendations.     Follow Up Recommendations  SNF;Supervision/Assistance - 24 hour     Equipment Recommendations  (TBA at next venue)    Recommendations for Other Services       Precautions / Restrictions Precautions Precautions: Fall Restrictions Weight Bearing Restrictions: No    Mobility  Bed Mobility Overal bed mobility: Independent             General bed mobility comments: Pt able to sit edge of ved without cueing or signs of difficulty.    Transfers Overall transfer level: Needs assistance Equipment used: None Transfers: Sit to/from Stand Sit to Stand: Min guard Stand pivot transfers: Min guard       General transfer comment: Min guard when in standing min assistance to maintain balance.    Ambulation/Gait Ambulation/Gait assistance: Min guard Ambulation Distance (Feet): 100 Feet Assistive device: None Gait Pattern/deviations: Step-through pattern;Narrow base of support;Decreased stride length;Trunk flexed   Gait velocity interpretation: Below normal speed for age/gender General Gait Details: Cues for forward gaze and upper trunk control.  Facilitation for weight shifting LOB  x2 with gait and unsteady presence when ambulating patient would benefit from an AD next session.     Stairs            Wheelchair Mobility    Modified Rankin (Stroke Patients Only)       Balance Overall balance assessment: Needs assistance   Sitting balance-Leahy Scale: Fair       Standing balance-Leahy Scale: Poor Standing balance comment: LOB noted during gait without support.  Pt grabbing to rails and grab bars.                             Cognition Arousal/Alertness: Awake/alert Behavior During Therapy: Flat affect Overall Cognitive Status: History of cognitive impairments - at baseline                                 General Comments: only answered yes no questions      Exercises      General Comments        Pertinent Vitals/Pain Pain Assessment: No/denies pain    Home Living                      Prior Function            PT Goals (current goals can now be found in the care plan section) Acute Rehab PT Goals Patient Stated Goal: unable to state, pt only answers yes no questions Potential to Achieve Goals: Good Progress  towards PT goals: Progressing toward goals    Frequency    Min 3X/week      PT Plan Discharge plan needs to be updated    Co-evaluation              AM-PAC PT "6 Clicks" Daily Activity  Outcome Measure  Difficulty turning over in bed (including adjusting bedclothes, sheets and blankets)?: None Difficulty moving from lying on back to sitting on the side of the bed? : None Difficulty sitting down on and standing up from a chair with arms (e.g., wheelchair, bedside commode, etc,.)?: Unable Help needed moving to and from a bed to chair (including a wheelchair)?: A Little Help needed walking in hospital room?: A Little Help needed climbing 3-5 steps with a railing? : A Little 6 Click Score: 18    End of Session Equipment Utilized During Treatment: Gait belt Activity Tolerance:  Patient tolerated treatment well Patient left: in chair;with call bell/phone within reach;with chair alarm set Nurse Communication: Mobility status(informed nurse tech of soiled bed and gown.  ) PT Visit Diagnosis: Muscle weakness (generalized) (M62.81)     Time: 1610-9604 PT Time Calculation (min) (ACUTE ONLY): 14 min  Charges:  $Gait Training: 8-22 mins                    G Codes:       Joycelyn Rua, PTA pager [phone]    Florestine Avers 02/12/2017, 5:00 PM

## 2017-02-12 NOTE — Progress Notes (Signed)
PROGRESS NOTE    Mike [name]  RUE:[id number] DOB: 1960-07-16 DOA: 02/07/2017 PCP: [name], NP   Brief Narrative: Mike [name] is a 57 y.o. male with medical history significant of insulin-dependent diabetes, cognitive impairment, essential hypertension, stroke. He presented with hyperglycemia and found to be in DKA. He was started on an insulin drip and DKA has resolved.    Assessment & Plan:   Active Problems:   Homelessness   Type II diabetes mellitus with renal manifestations (HCC)   Protein-calorie malnutrition, severe   Essential hypertension   HLD (hyperlipidemia)   DKA (diabetic ketoacidoses) (HCC)   History of stroke   Malnutrition of moderate degree   DKA Insulin dependent diabetes mellitus Patient started on IV insulin with initial closure of anion gap. Blood sugar poorly controlled with regimen yesterday. Anion gap reopened and CO2 decreasing. -Increase to Lantus 40 units daily -Continue to SSI cevere scale -heart healthy/carb modified diet -Zofran prn for nausea  Dyspnea Patient with some mild dyspnea and cough, concerning for pneumonia. Patient with recent prolonged hospital stay. -Procalcitonin -Chest x-ray -Sputum culture  Grade 1 diastolic dysfunction No heart failure.  Protein-calorie malnutrition, moderate Secondary to chronic disease and homelessness -Continue protein supplementation  Acute kidney injury Secondary to prerenal disease in setting of DKA and dehydration. Resolved.  History of stroke MRI from 07/2016 significant for severe chronic microvascular ischemic changes and severe parenchymal volume loss. Contributing to patient's cognitive impairment and lack of capacity -Continue Lipitor  Social issues Patient recently with prolonged admission secondary to discharge planning issues as patient has cognitive impairment and insulin dependent DM. Evaluated by psychiatry last admission and patient does not have capacity to  manage his medical issues. -CSW/CM consulted  Essential hypertension Uncontrolled but improved. Received 50 mg of chlorthalidone yesterday -Continue chlorthalidone (increased to 100 mg) and lisinopril -Hydralazine IV prn   DVT prophylaxis: Lovenox Code Status: Full code Family Communication: None at bedside Disposition Plan: Pending control of blood sugar and safe discharge plan   Consultants:   None  Procedures:   Transthoracic Echocardiogram (02/10/2017) Study Conclusions  - Left ventricle: The cavity size was normal. Wall thickness was   increased in a pattern of moderate to severe LVH. Systolic   function was vigorous. The estimated ejection fraction was in the   range of 65% to 70%. Mild intracavitary gradient peak 26 mmHg.   Wall motion was normal; there were no regional wall motion   abnormalities. Doppler parameters are consistent with abnormal   left ventricular relaxation (grade 1 diastolic dysfunction). - Aortic valve: Valve area (VTI): 3.12 cm^2. Valve area (Vmax):   3.19 cm^2. Valve area (Vmean): 2.86 cm^2.  Antimicrobials:  None    Subjective: Nausea overnight without vomiting. Did not ask for antiemetic.   Objective: Vitals:   02/11/17 1254 02/11/17 1619 02/11/17 2216 02/12/17 0431  BP: (!) 166/76 (!) 163/85 (!) 176/80 (!) 173/86  Pulse: 97 (!) 101 94 87  Resp:   15 16  Temp: 99.4 F (37.4 C) 99.5 F (37.5 C) 99.1 F (37.3 C) 98.7 F (37.1 C)  TempSrc: Oral Oral Oral Oral  SpO2: 100% 99% 99% 99%  Weight:      Height:        Intake/Output Summary (Last 24 hours) at 02/12/2017 0939 Last data filed at 02/12/2017 1478 Gross per 24 hour  Intake 200 ml  Output 926 ml  Net -726 ml   Filed Weights   02/07/17 2000  Weight: 68.6 kg (151 lb  3.8 oz)    Examination:  General exam: Appears calm and comfortable Respiratory system: Clear to auscultation bilaterally. Unlabored work of breathing. No wheezing or rales. Cardiovascular system: S1 &  S2 heard, RRR. No murmur Gastrointestinal system: Abdomen is nondistended, soft and nontender. Normal bowel sounds heard. Central nervous system: Alert and oriented to person and place. No focal neurological deficits. Extremities: No edema. No calf tenderness Skin: No cyanosis. No rashes Psychiatry: Judgement and insight appear impaired. Flat affect.     Data Reviewed: I have personally reviewed following labs and imaging studies  CBC: Recent Labs  Lab 02/07/17 1458  WBC 8.3  NEUTROABS 5.7  HGB 12.7*  HCT 40.0  MCV 80.0  PLT 399   Basic Metabolic Panel: Recent Labs  Lab 02/07/17 1458  02/08/17 0225  02/09/17 2305 02/10/17 0454 02/10/17 1659 02/11/17 0330 02/12/17 0304  NA 141   < > 151*   < > 145 144 142 141 139  K 5.1   < > 3.9   < > 3.6 3.5 4.1 3.6 3.2*  CL 98*   < > 115*   < > 108 108 105 106 102  CO2 21*   < > 22   < > 25 23 22 24 25   GLUCOSE 739*   < > 210*   < > 138* 202* 325* 325* 253*  BUN 51*   < > 31*   < > 19 19 21* 20 16  CREATININE 2.29*   < > 1.26*   < > 0.92 1.06 1.11 1.00 0.92  CALCIUM 9.6   < > 9.1   < > 9.7 9.8 9.4 9.4 9.2  MG 3.0*  --  2.6*  --   --   --   --   --   --   PHOS 5.5*  --  2.4*  --   --   --   --   --   --    < > = values in this interval not displayed.   GFR: Estimated Creatinine Clearance: 88 mL/min (by C-G formula based on SCr of 0.92 mg/dL). Liver Function Tests: No results for input(s): AST, ALT, ALKPHOS, BILITOT, PROT, ALBUMIN in the last 168 hours. No results for input(s): LIPASE, AMYLASE in the last 168 hours. No results for input(s): AMMONIA in the last 168 hours. Coagulation Profile: No results for input(s): INR, PROTIME in the last 168 hours. Cardiac Enzymes: No results for input(s): CKTOTAL, CKMB, CKMBINDEX, TROPONINI in the last 168 hours. BNP (last 3 results) No results for input(s): PROBNP in the last 8760 hours. HbA1C: No results for input(s): HGBA1C in the last 72 hours. CBG: Recent Labs  Lab 02/11/17 0410  02/11/17 0754 02/11/17 1138 02/11/17 2059 02/12/17 0736  GLUCAP 328* 252* 233* 194* 305*   Lipid Profile: No results for input(s): CHOL, HDL, LDLCALC, TRIG, CHOLHDL, LDLDIRECT in the last 72 hours. Thyroid Function Tests: No results for input(s): TSH, T4TOTAL, FREET4, T3FREE, THYROIDAB in the last 72 hours. Anemia Panel: No results for input(s): VITAMINB12, FOLATE, FERRITIN, TIBC, IRON, RETICCTPCT in the last 72 hours. Sepsis Labs: Recent Labs  Lab 02/11/17 1002  PROCALCITON <0.10    Recent Results (from the past 240 hour(s))  MRSA PCR Screening     Status: None   Collection Time: 02/07/17  8:29 PM  Result Value Ref Range Status   MRSA by PCR NEGATIVE NEGATIVE Final    Comment:        The GeneXpert MRSA Assay (FDA approved  for NASAL specimens only), is one component of a comprehensive MRSA colonization surveillance program. It is not intended to diagnose MRSA infection nor to guide or monitor treatment for MRSA infections.          Radiology Studies: Dg Chest 2 View  Result Date: 02/11/2017 CLINICAL DATA:  Shortness of Breath EXAM: CHEST  2 VIEW COMPARISON:  11/17/2016 FINDINGS: Cardiac shadow is stable. The lungs are well aerated bilaterally. No focal infiltrate or sizable effusion is seen. Extrinsic artifact is noted over the left lung base. Bilateral nipple shadows are noted. IMPRESSION: No acute abnormality noted. Electronically Signed   By: Alcide Clever M.D.   On: 02/11/2017 10:04        Scheduled Meds: . atorvastatin  40 mg Oral Daily  . chlorhexidine  15 mL Mouth Rinse BID  . chlorthalidone  100 mg Oral Daily  . feeding supplement (GLUCERNA SHAKE)  237 mL Oral TID BM  . ferrous sulfate  325 mg Oral Daily  . insulin aspart  0-20 Units Subcutaneous TID AC  . insulin aspart  0-5 Units Subcutaneous QHS  . insulin glargine  40 Units Subcutaneous Daily  . lisinopril  40 mg Oral Daily  . mouth rinse  15 mL Mouth Rinse q12n4p   Continuous  Infusions:    LOS: 5 days     Jacquelin Hawking, MD [facility] 02/12/2017, 9:39 AM Pager: [phone]  If 7PM-7AM, please contact night-coverage [email or web address] Password [facility] 02/12/2017, 9:39 AM

## 2017-02-13 ENCOUNTER — Encounter: Payer: Self-pay | Admitting: Family Medicine

## 2017-02-13 DIAGNOSIS — I10 Essential (primary) hypertension: Secondary | ICD-10-CM

## 2017-02-13 DIAGNOSIS — E440 Moderate protein-calorie malnutrition: Secondary | ICD-10-CM

## 2017-02-13 DIAGNOSIS — N179 Acute kidney failure, unspecified: Secondary | ICD-10-CM

## 2017-02-13 DIAGNOSIS — E1110 Type 2 diabetes mellitus with ketoacidosis without coma: Principal | ICD-10-CM

## 2017-02-13 DIAGNOSIS — E43 Unspecified severe protein-calorie malnutrition: Secondary | ICD-10-CM

## 2017-02-13 DIAGNOSIS — Z590 Homelessness: Secondary | ICD-10-CM

## 2017-02-13 DIAGNOSIS — E1165 Type 2 diabetes mellitus with hyperglycemia: Secondary | ICD-10-CM

## 2017-02-13 DIAGNOSIS — Z794 Long term (current) use of insulin: Secondary | ICD-10-CM

## 2017-02-13 LAB — BASIC METABOLIC PANEL
Anion gap: 11 (ref 5–15)
BUN: 20 mg/dL (ref 6–20)
CHLORIDE: 100 mmol/L — AB (ref 101–111)
CO2: 27 mmol/L (ref 22–32)
CREATININE: 1.03 mg/dL (ref 0.61–1.24)
Calcium: 9.3 mg/dL (ref 8.9–10.3)
GFR calc non Af Amer: >60 mL/min (ref >60)
Glucose, Bld: 175 mg/dL — ABNORMAL HIGH (ref 65–99)
POTASSIUM: 3 mmol/L — AB (ref 3.5–5.1)
SODIUM: 138 mmol/L (ref 135–145)

## 2017-02-13 LAB — GLUCOSE, CAPILLARY
GLUCOSE-CAPILLARY: 275 mg/dL — AB (ref 65–99)
Glucose-Capillary: 182 mg/dL — ABNORMAL HIGH (ref 65–99)
Glucose-Capillary: 175 mg/dL — ABNORMAL HIGH (ref 65–99)

## 2017-02-13 LAB — HIV ANTIBODY (ROUTINE TESTING W REFLEX): HIV Screen 4th Generation wRfx: NONREACTIVE

## 2017-02-13 MED ORDER — POTASSIUM CHLORIDE CRYS ER 20 MEQ PO TBCR
40.0000 meq | EXTENDED_RELEASE_TABLET | Freq: Three times a day (TID) | ORAL | Status: DC
  Administered 2017-02-13 (×2): 40 meq via ORAL
  Filled 2017-02-13 (×2): qty 2

## 2017-02-13 MED ORDER — CHLORTHALIDONE 100 MG PO TABS: 100.0000 mg | ORAL_TABLET | Freq: Every day | ORAL | 0 refills | Status: DC

## 2017-02-13 MED ORDER — INSULIN ASPART PROT & ASPART (70-30 MIX) 100 UNIT/ML ~~LOC~~ SUSP: 30.0000 [IU] | Freq: Two times a day (BID) | SUBCUTANEOUS | 11 refills | Status: DC

## 2017-02-13 NOTE — Progress Notes (Signed)
[facility] and discharge scripts given before leaving unit. Pt given taxi voucher by Child psychotherapistsocial worker. Condition stable for discharge

## 2017-02-13 NOTE — Discharge Summary (Addendum)
Physician Discharge Summary  Mike [name] KGU:[id number] DOB: 05-23-1960 DOA: 02/07/2017  PCP: [name], NP  Admit date: 02/07/2017 Discharge date: 02/13/2017  Admitted From: home Disposition:  Home  Recommendations for Outpatient Follow-up:  1. Follow up with PCP in 1-2 weeks 2. Please obtain BMP/CBC in one week   Home Health:No Equipment/Devices:None  Discharge Condition:stable CODE STATUS:full Diet recommendation:Carb Modified  Brief/Interim Summary: 57 y.o. malewith medical history significant ofinsulin-dependent diabetes, cognitive impairment, essential hypertension, stroke. He presented with hyperglycemia and found to be in DKA. He was started on an insulin drip.    Discharge Diagnoses:  Active Problems:   Homelessness   Type II diabetes mellitus with renal manifestations (HCC)   Protein-calorie malnutrition, severe   Essential hypertension   HLD (hyperlipidemia)   DKA (diabetic ketoacidoses) (HCC)   History of stroke   Malnutrition of moderate degree  DKA Insulin dependent diabetes mellitus Her on IV insulin and his potassium improved he was changed to long-acting insulin his glucose remained well controlled. He will go home on insulin 7030 twice a day he was given a prescription for this. Chest x-ray was negative pro calcitonin was less than 10.  His dyspnea resolved without any treatment.  Grade 1 diastolic dysfunction Seems to be euvolemic stable.  Protein-calorie malnutrition, moderate Secondary to chronic disease and homelessness -Continue protein supplementation  Acute kidney injury Likely due to DKA resolved with IV fluid hydration.  History of stroke -Continue Lipitor  Social issues Evaluated by psychiatry last admission and patient does not have capacity to manage his medical issues. Therapy evaluated the patient recommended skilled, social work a not find a place for him for his physical therapy, due to lack of  insurance. Patient is stable for discharge.  Essential hypertension Improved with  current regimen will need further titration as an outpatient.    Discharge Instructions  Discharge Instructions    Diet - low sodium heart healthy   Complete by:  As directed    Increase activity slowly   Complete by:  As directed      Allergies as of 02/13/2017      Reactions   Ibuprofen Nausea And Vomiting, Other (See Comments)   Makes the patient feel bloated also   Tylenol [acetaminophen] Nausea And Vomiting, Other (See Comments)   Makes the patient feel bloated also      Medication List    TAKE these medications   acetaminophen 500 MG tablet Commonly known as:  TYLENOL Take 500-1,000 mg by mouth every 6 (six) hours as needed (for pain or headaches).   atorvastatin 40 MG tablet Commonly known as:  LIPITOR Take 1 tablet (40 mg total) by mouth daily.   blood glucose meter kit and supplies Kit Dispense based on patient and insurance preference. Use up to four times daily as directed. (FOR ICD-9 250.00, 250.01).   chlorthalidone 100 MG tablet Commonly known as:  HYGROTEN Take 1 tablet (100 mg total) by mouth daily. What changed:    medication strength  how much to take   ferrous sulfate 325 (65 FE) MG tablet Take 1 tablet (325 mg total) daily by mouth. What changed:    when to take this  reasons to take this   insulin aspart protamine- aspart (70-30) 100 UNIT/ML injection Commonly known as:  NOVOLOG MIX 70/30 Inject 0.3 mLs (30 Units total) into the skin 2 (two) times daily with a meal.   lisinopril 40 MG tablet Commonly known as:  PRINIVIL,ZESTRIL Take 1 tablet (40  mg total) by mouth daily.   multivitamin Tabs tablet Take 1 tablet by mouth daily.   protein supplement shake Liqd Commonly known as:  PREMIER PROTEIN Take 325 mLs (11 oz total) by mouth 2 (two) times daily between meals.       Allergies  Allergen Reactions  . Ibuprofen Nausea And Vomiting and  Other (See Comments)    Makes the patient feel bloated also  . Tylenol [Acetaminophen] Nausea And Vomiting and Other (See Comments)    Makes the patient feel bloated also    Consultations:  None   Procedures/Studies: Dg Chest 2 View  Result Date: 02/11/2017 CLINICAL DATA:  Shortness of Breath EXAM: CHEST  2 VIEW COMPARISON:  11/17/2016 FINDINGS: Cardiac shadow is stable. The lungs are well aerated bilaterally. No focal infiltrate or sizable effusion is seen. Extrinsic artifact is noted over the left lung base. Bilateral nipple shadows are noted. IMPRESSION: No acute abnormality noted. Electronically Signed   By: Inez Catalina M.D.   On: 02/11/2017 10:04     Subjective: No complaints feels great.  Discharge Exam: Vitals:   02/12/17 2300 02/13/17 0452  BP: (!) 141/89 136/82  Pulse: 88 82  Resp: 20 18  Temp: 98.5 F (36.9 C) 98.4 F (36.9 C)  SpO2: 99% 92%   Vitals:   02/12/17 0431 02/12/17 1424 02/12/17 2300 02/13/17 0452  BP: (!) 173/86 (!) 147/69 (!) 141/89 136/82  Pulse: 87 98 88 82  Resp: '16 16 20 18  '$ Temp: 98.7 F (37.1 C) 98.5 F (36.9 C) 98.5 F (36.9 C) 98.4 F (36.9 C)  TempSrc: Oral Oral Oral   SpO2: 99% 99% 99% 92%  Weight:      Height:        General: Pt is alert, awake, not in acute distress Cardiovascular: RRR, S1/S2 +, no rubs, no gallops Respiratory: CTA bilaterally, no wheezing, no rhonchi Abdominal: Soft, NT, ND, bowel sounds + Extremities: no edema, no cyanosis    The results of significant diagnostics from this hospitalization (including imaging, microbiology, ancillary and laboratory) are listed below for reference.     Microbiology: Recent Results (from the past 240 hour(s))  MRSA PCR Screening     Status: None   Collection Time: 02/07/17  8:29 PM  Result Value Ref Range Status   MRSA by PCR NEGATIVE NEGATIVE Final    Comment:        The GeneXpert MRSA Assay (FDA approved for NASAL specimens only), is one component of  a comprehensive MRSA colonization surveillance program. It is not intended to diagnose MRSA infection nor to guide or monitor treatment for MRSA infections.      Labs: BNP (last 3 results) No results for input(s): BNP in the last 8760 hours. Basic Metabolic Panel: Recent Labs  Lab 02/07/17 1458  02/08/17 0225  02/10/17 0454 02/10/17 1659 02/11/17 0330 02/12/17 0304 02/13/17 0618  NA 141   < > 151*   < > 144 142 141 139 138  K 5.1   < > 3.9   < > 3.5 4.1 3.6 3.2* 3.0*  CL 98*   < > 115*   < > 108 105 106 102 100*  CO2 21*   < > 22   < > '23 22 24 25 27  '$ GLUCOSE 739*   < > 210*   < > 202* 325* 325* 253* 175*  BUN 51*   < > 31*   < > 19 21* '20 16 20  '$ CREATININE 2.29*   < >  1.26*   < > 1.06 1.11 1.00 0.92 1.03  CALCIUM 9.6   < > 9.1   < > 9.8 9.4 9.4 9.2 9.3  MG 3.0*  --  2.6*  --   --   --   --   --   --   PHOS 5.5*  --  2.4*  --   --   --   --   --   --    < > = values in this interval not displayed.   Liver Function Tests: No results for input(s): AST, ALT, ALKPHOS, BILITOT, PROT, ALBUMIN in the last 168 hours. No results for input(s): LIPASE, AMYLASE in the last 168 hours. No results for input(s): AMMONIA in the last 168 hours. CBC: Recent Labs  Lab 02/07/17 1458  WBC 8.3  NEUTROABS 5.7  HGB 12.7*  HCT 40.0  MCV 80.0  PLT 399   Cardiac Enzymes: No results for input(s): CKTOTAL, CKMB, CKMBINDEX, TROPONINI in the last 168 hours. BNP: Invalid input(s): POCBNP CBG: Recent Labs  Lab 02/12/17 0736 02/12/17 1215 02/12/17 1727 02/12/17 2148 02/13/17 0746  GLUCAP 305* 317* 92 131* 182*   D-Dimer No results for input(s): DDIMER in the last 72 hours. Hgb A1c No results for input(s): HGBA1C in the last 72 hours. Lipid Profile No results for input(s): CHOL, HDL, LDLCALC, TRIG, CHOLHDL, LDLDIRECT in the last 72 hours. Thyroid function studies No results for input(s): TSH, T4TOTAL, T3FREE, THYROIDAB in the last 72 hours.  Invalid input(s): FREET3 Anemia work  up No results for input(s): VITAMINB12, FOLATE, FERRITIN, TIBC, IRON, RETICCTPCT in the last 72 hours. Urinalysis    Component Value Date/Time   COLORURINE YELLOW 02/07/2017 1549   APPEARANCEUR CLEAR 02/07/2017 1549   LABSPEC 1.025 02/07/2017 1549   PHURINE 5.0 02/07/2017 1549   GLUCOSEU >=500 (A) 02/07/2017 1549   HGBUR SMALL (A) 02/07/2017 1549   BILIRUBINUR NEGATIVE 02/07/2017 1549   KETONESUR 20 (A) 02/07/2017 1549   PROTEINUR 30 (A) 02/07/2017 1549   UROBILINOGEN 1.0 02/07/2011 1605   NITRITE NEGATIVE 02/07/2017 1549   LEUKOCYTESUR NEGATIVE 02/07/2017 1549   Sepsis Labs Invalid input(s): PROCALCITONIN,  WBC,  LACTICIDVEN Microbiology Recent Results (from the past 240 hour(s))  MRSA PCR Screening     Status: None   Collection Time: 02/07/17  8:29 PM  Result Value Ref Range Status   MRSA by PCR NEGATIVE NEGATIVE Final    Comment:        The GeneXpert MRSA Assay (FDA approved for NASAL specimens only), is one component of a comprehensive MRSA colonization surveillance program. It is not intended to diagnose MRSA infection nor to guide or monitor treatment for MRSA infections.      Time coordinating discharge: Over 30 minutes  SIGNED:   Charlynne Cousins, MD  [facility] 02/13/2017, 12:23 PM Pager   If 7PM-7AM, please contact night-coverage [email or web address] Password [email or web address]

## 2017-02-13 NOTE — Care Management Note (Addendum)
Case Management Note  Patient Details  Name: Tobin ChadDesbourns Hopes MRN: [id number] Date of Birth: 1960-03-30  Subjective/Objective:                    Action/Plan:  Patient has been discharged . Plan : social worker will provide cab voucher for [facility] to go to [facility] tonight . Bedside nurse to call Cab at 1745 [facility] opens at 1800.   Bedside nurse will give hard copies of prescriptions to patient at discharge. Patient will take prescriptions to [facility] to be filled.   Above explained to patient , acknowledged understanding.   Carla from medical office at [facility] returned call. Carla aware patient going to stay at the [facility] tonight she [facility]. Patient voiced understanding.  [name] NP from [facility] also returned call and is aware patient is going to [facility] this evening , she will see him in am. Expected Discharge Date:  02/13/17               Expected Discharge Plan:  Homeless Shelter  In-House Referral:  Clinical Social Work  Discharge planning Services  CM Consult, Medication Assistance  Post Acute Care Choice:  NA Choice offered to:  Patient  DME Arranged:  N/A DME Agency:  NA  HH Arranged:  NA HH Agency:  NA  Status of Service:  Completed, signed off  If discussed at [employer] Meetings, dates discussed:    Additional Comments:  Kingsley PlanWile, Tirth Cothron Marie, RN 02/13/2017, 2:18 PM

## 2017-02-13 NOTE — Social Work (Signed)
CSW spoke with pt regarding importance of utilizing supports that he does have in the community including the [facility] downtown. [facility] is aware that he is discharging downtown to the [facility] and will be able to fill pt prescriptions tomorrow morning [facility].   CSW also asked pt again about what supports he could utilize to help him continue to manage his diabetes and he stated that there is no one but that he would continue to try and [facility]. Pt acknowledged that he needs to try and manage his diabetes in order to prevent future hospitalizations.   CSW will provide pt with cab voucher [facility] which [facility] be open this evening for white flag warming due to the low temperatures. Pt RN aware that cab can be called at 5:45pm in order to arrive at 6:00pm when shelter opens.   CSW signing off. Please consult if any additional needs arise.  Mike [name], LCSWA [facility] Clinical Social Work [phone]

## 2017-02-13 NOTE — Progress Notes (Signed)
Nutrition Follow-up  DOCUMENTATION CODES:   Non-severe (moderate) malnutrition in context of social or environmental circumstances  INTERVENTION:   -D/c Glucerna Shake po TID, each supplement provides 220 kcal and 10 grams of protein -Ensure Enlive po TID, each supplement provides 350 kcal and 20 grams of protein  NUTRITION DIAGNOSIS:   Moderate Malnutrition related to social / environmental circumstances(homelessness) as evidenced by moderate muscle depletion, moderate fat depletion.  Ongoing  GOAL:   Patient will meet greater than or equal to 90% of their needs  Progressing  MONITOR:   PO intake, Supplement acceptance, Labs, Weight trends  REASON FOR ASSESSMENT:   Malnutrition Screening Tool    ASSESSMENT:   57 yo male with PMH insulin-dependent T2DM, cognitive impairment, stroke, HTN, AKI secondary to prerenal DKA/dehydration admitted 1/23 with hyperglycemia  Pt sleeping soundly at time of visit. Reviewed meal tray from breakfast; pt consumed only a few bites of oatmeal and 75% of orange juice cup. Meal completion is extremely variable (PO: 25-80%).   Case discussed with RN, who reports that pt is eating very little. He will consume small bites and sips with a lot of encouragement. Per RN, this is very unusual and concerning for pt, as pt would eat constantly and snack on crackers at coffee during prior admission. Pt will consume supplements.   Reviewed CSW notes; most appropriate discharge plan is being developed. Pt with many socioeconomic barriers.   Pt with poor oral intake with moderate malnutrition and would benefit from nutrient dense supplement. One Ensure Enlive supplement provides 350 kcals, 20 grams protein, and 44-45 grams of carbohydrate vs one Glucerna shake supplement, which provides 220 kcals, 10 grams of protein, and 26 grams of carbohydrate. Given pt's hx of DM, RD will continue to monitor PO intake, CBGS, and adjust supplement regimen as appropriate.    Labs reviewed: K: 3.0 (on PO supplementation), CBGS: 92-182 (inpatient orders for glycemic control are 0-20 units insulin aspart TID with meals, 0-5 units insulin aspart q HS, and 40 units insulin glargine daily).   Diet Order:  Diet heart healthy/carb modified Room service appropriate? Yes; Fluid consistency: Thin Diet - low sodium heart healthy Diet - low sodium heart healthy  EDUCATION NEEDS:   No education needs have been identified at this time  Skin:  Skin Assessment: Reviewed RN Assessment  Last BM:  02/12/17  Height:   Ht Readings from Last 1 Encounters:  02/07/17 6\' 4"  (1.93 m)    Weight:   Wt Readings from Last 1 Encounters:  02/07/17 151 lb 3.8 oz (68.6 kg)    Ideal Body Weight:  91.6 kg  BMI:  Body mass index is 18.41 kg/m.  Estimated Nutritional Needs:   Kcal:  2,200-2,400 kcal  Protein:  90-105 grams  Fluid:  2.2-2.4 L    Jadalynn Burr A. Mayford KnifeWilliams, RD, LDN, CDE Pager: [phone] After hours Pager: [phone]

## 2017-02-22 ENCOUNTER — Inpatient Hospital Stay (INDEPENDENT_AMBULATORY_CARE_PROVIDER_SITE_OTHER): Payer: Self-pay | Admitting: Physician Assistant

## 2017-02-25 ENCOUNTER — Encounter (HOSPITAL_COMMUNITY): Payer: Self-pay | Admitting: Emergency Medicine

## 2017-02-25 ENCOUNTER — Emergency Department (HOSPITAL_COMMUNITY)
Admission: EM | Admit: 2017-02-25 | Discharge: 2017-02-26 | Disposition: A | Discharge status: Home / Self Care | Payer: Self-pay | Attending: Emergency Medicine | Admitting: Emergency Medicine

## 2017-02-25 DIAGNOSIS — Z9114 Patient's other noncompliance with medication regimen: Secondary | ICD-10-CM | POA: Insufficient documentation

## 2017-02-25 DIAGNOSIS — Z794 Long term (current) use of insulin: Secondary | ICD-10-CM | POA: Insufficient documentation

## 2017-02-25 DIAGNOSIS — R739 Hyperglycemia, unspecified: Secondary | ICD-10-CM

## 2017-02-25 DIAGNOSIS — E1165 Type 2 diabetes mellitus with hyperglycemia: Secondary | ICD-10-CM | POA: Insufficient documentation

## 2017-02-25 DIAGNOSIS — Z79899 Other long term (current) drug therapy: Secondary | ICD-10-CM | POA: Insufficient documentation

## 2017-02-25 DIAGNOSIS — F17210 Nicotine dependence, cigarettes, uncomplicated: Secondary | ICD-10-CM | POA: Insufficient documentation

## 2017-02-25 DIAGNOSIS — I10 Essential (primary) hypertension: Secondary | ICD-10-CM | POA: Insufficient documentation

## 2017-02-25 DIAGNOSIS — Z590 Homelessness: Secondary | ICD-10-CM | POA: Insufficient documentation

## 2017-02-25 LAB — CBC WITH DIFFERENTIAL/PLATELET
BASOS ABS: 0 10*3/uL (ref 0.0–0.1)
BASOS PCT: 1 %
EOS ABS: 0.1 10*3/uL (ref 0.0–0.7)
EOS PCT: 1 %
HEMATOCRIT: 32.2 % — AB (ref 39.0–52.0)
Hemoglobin: 9.9 g/dL — ABNORMAL LOW (ref 13.0–17.0)
Lymphocytes Relative: 18 %
Lymphs Abs: 1 10*3/uL (ref 0.7–4.0)
MCH: 24.8 pg — ABNORMAL LOW (ref 26.0–34.0)
MCHC: 30.7 g/dL (ref 30.0–36.0)
MCV: 80.7 fL (ref 78.0–100.0)
MONO ABS: 0.3 10*3/uL (ref 0.1–1.0)
MONOS PCT: 5 %
Neutro Abs: 4.2 10*3/uL (ref 1.7–7.7)
Neutrophils Relative %: 75 %
PLATELETS: 300 10*3/uL (ref 150–400)
RBC: 3.99 MIL/uL — ABNORMAL LOW (ref 4.22–5.81)
RDW: 13.5 % (ref 11.5–15.5)
WBC: 5.6 10*3/uL (ref 4.0–10.5)

## 2017-02-25 LAB — URINALYSIS, ROUTINE W REFLEX MICROSCOPIC
Bacteria, UA: NONE SEEN
Bilirubin Urine: NEGATIVE
HGB URINE DIPSTICK: NEGATIVE
KETONES UR: NEGATIVE mg/dL
LEUKOCYTES UA: NEGATIVE
Nitrite: NEGATIVE
PROTEIN: NEGATIVE mg/dL
RBC / HPF: NONE SEEN RBC/hpf (ref 0–5)
SQUAMOUS EPITHELIAL / LPF: NONE SEEN
Specific Gravity, Urine: 1.027 (ref 1.005–1.030)
WBC UA: NONE SEEN WBC/hpf (ref 0–5)
pH: 6 (ref 5.0–8.0)

## 2017-02-25 LAB — I-STAT VENOUS BLOOD GAS, ED
ACID-BASE EXCESS: 2 mmol/L (ref 0.0–2.0)
Bicarbonate: 27.5 mmol/L (ref 20.0–28.0)
O2 Saturation: 77 %
PH VEN: 7.391 (ref 7.250–7.430)
PO2 VEN: 43 mmHg (ref 32.0–45.0)
TCO2: 29 mmol/L (ref 22–32)
pCO2, Ven: 45.4 mmHg (ref 44.0–60.0)

## 2017-02-25 LAB — COMPREHENSIVE METABOLIC PANEL
ALBUMIN: 3.8 g/dL (ref 3.5–5.0)
ALT: 22 U/L (ref 17–63)
ANION GAP: 12 (ref 5–15)
AST: 21 U/L (ref 15–41)
Alkaline Phosphatase: 102 U/L (ref 38–126)
BILIRUBIN TOTAL: 0.5 mg/dL (ref 0.3–1.2)
BUN: 17 mg/dL (ref 6–20)
CHLORIDE: 94 mmol/L — AB (ref 101–111)
CO2: 26 mmol/L (ref 22–32)
Calcium: 9.6 mg/dL (ref 8.9–10.3)
Creatinine, Ser: 1.32 mg/dL — ABNORMAL HIGH (ref 0.61–1.24)
GFR calc Af Amer: >60 mL/min (ref >60)
GFR calc non Af Amer: 59 mL/min — ABNORMAL LOW (ref >60)
GLUCOSE: 1073 mg/dL — AB (ref 65–99)
POTASSIUM: 4.7 mmol/L (ref 3.5–5.1)
SODIUM: 132 mmol/L — AB (ref 135–145)
TOTAL PROTEIN: 7.2 g/dL (ref 6.5–8.1)

## 2017-02-25 MED ORDER — SODIUM CHLORIDE 0.9 % IV BOLUS (SEPSIS)
1000.0000 mL | Freq: Once | INTRAVENOUS | Status: AC
  Administered 2017-02-25: 1000 mL via INTRAVENOUS

## 2017-02-25 NOTE — ED Provider Notes (Signed)
[place] EMERGENCY DEPARTMENT Provider Note   CSN: [id number] Arrival date & time: 02/25/17  2145     History   Chief Complaint Chief Complaint  Patient presents with  . Hyperglycemia    HPI Mike [name] is a 57 y.o. male.  The history is provided by the patient and medical records.  Hyperglycemia     57 y.o. M with hx of anxiety, chronic back pain, depression, HTN, HLP, migraines, DM2, presenting to the ED for hyperglycemia.  Patient has been seen here multiple times for same.  Patient is currently homeless, therefore no access to medications.  States the last time he had any medicine was when he was here in the ED.  He was admitted for mild DKA about 3 weeks ago.  He is continued eating regularly.  Does admit to frequent urination.  No fever or chills.  Denies any abdominal pain, chest pain, shortness of breath.  Past Medical History:  Diagnosis Date  . Anxiety   . Anxiety   . Chronic lower back pain   . Depression   . DKA (diabetic ketoacidoses) ([place]) 07/30/2016  . Hyperlipemia   . Hypertension   . Migraine    "last one was ~ 4 yr ago" (12/23/2014)  . Seizures ([place])    "related to pills for anxiety; if I don't take the pills I'm suppose to take I'll have them" (12/23/2014)  . Type II diabetes mellitus [facility])     Patient Active Problem List   Diagnosis Date Noted  . Malnutrition of moderate degree 02/08/2017  . DKA (diabetic ketoacidoses) ([place]) 02/07/2017  . History of stroke 02/07/2017  . Hyperphosphatemia 12/13/2016  . Evaluation by psychiatric service required 11/30/2016  . Lactic acidosis 11/18/2016  . Normocytic anemia 11/06/2016  . HLD (hyperlipidemia) 11/06/2016  . Adjustment disorder with other symptoms 11/05/2016  . Uncontrolled type 2 diabetes mellitus with hyperosmolar nonketotic hyperglycemia ([place]) 08/24/2016  . Essential hypertension 08/24/2016  . Acute ischemic stroke ([place])   . Protein-calorie malnutrition, severe 12/24/2014   . Homelessness 12/23/2014  . DM (diabetes mellitus) ([place]) 12/23/2014  . Intermittent palpitations 12/23/2014  . Tobacco abuse 12/23/2014  . Pleuritic chest pain 12/23/2014  . Abnormal EKG 12/23/2014  . Type II diabetes mellitus with renal manifestations [facility])     Past Surgical History:  Procedure Laterality Date  . NO PAST SURGERIES         Home Medications    Prior to Admission medications   Medication Sig Start Date End Date Taking? Authorizing Provider  acetaminophen (TYLENOL) 500 MG tablet Take 500-1,000 mg by mouth every 6 (six) hours as needed (for pain or headaches).    [provider]  atorvastatin (LIPITOR) 40 MG tablet Take 1 tablet (40 mg total) by mouth daily. 01/26/17   Cardama, Grayce Sessions, MD  blood glucose meter kit and supplies KIT Dispense based on patient and insurance preference. Use up to four times daily as directed. (FOR ICD-9 250.00, 250.01). 11/22/16   Velvet Bathe, MD  chlorthalidone (HYGROTEN) 100 MG tablet Take 1 tablet (100 mg total) by mouth daily. 02/13/17   Charlynne Cousins, MD  ferrous sulfate 325 (65 FE) MG tablet Take 1 tablet (325 mg total) daily by mouth. Patient taking differently: Take 325 mg by mouth daily as needed (low iron).  11/23/16   Ward, Delice Bison, DO  insulin aspart protamine- aspart (NOVOLOG MIX 70/30) (70-30) 100 UNIT/ML injection Inject 0.3 mLs (30 Units total) into the skin 2 (two)  times daily with a meal. 02/13/17   Charlynne Cousins, MD  lisinopril (PRINIVIL,ZESTRIL) 40 MG tablet Take 1 tablet (40 mg total) by mouth daily. 01/26/17   CardamaGrayce Sessions, MD  multivitamin (PROSIGHT) TABS tablet Take 1 tablet by mouth daily. Patient not taking: Reported on 01/30/2017 12/22/16   Rai, Vernelle Emerald, MD  protein supplement shake (PREMIER PROTEIN) LIQD Take 325 mLs (11 oz total) by mouth 2 (two) times daily between meals. Patient not taking: Reported on 01/30/2017 12/22/16   Mendel Corning, MD    Family History Family  History  Problem Relation Age of Onset  . Diabetes Mellitus II Mother   . Diabetes Mellitus II Father     Social History Social History   Tobacco Use  . Smoking status: Current Every Day Smoker    Packs/day: 0.10    Years: 40.00    Pack years: 4.00    Types: Cigarettes  . Smokeless tobacco: Never Used  Substance Use Topics  . Alcohol use: Yes    Alcohol/week: 1.2 oz    Types: 2 Cans of beer per week  . Drug use: No    Comment: 12/23/2014 "stopped ~ 10 yrs ago"     Allergies   Ibuprofen and Tylenol [acetaminophen]   Review of Systems Review of Systems  Endocrine:       Hyperglycemia  All other systems reviewed and are negative.    Physical Exam Updated Vital Signs BP (!) 152/74 (BP Location: Left Arm)   Pulse 84   Temp 98.4 F (36.9 C) (Oral)   Resp 16   SpO2 99%   Physical Exam  Constitutional: He is oriented to person, place, and time. He appears well-developed and well-nourished.  Disheveled appearance  HENT:  Head: Normocephalic and atraumatic.  Mouth/Throat: Oropharynx is clear and moist.  Slightly dry mucous membranes  Eyes: Conjunctivae and EOM are normal. Pupils are equal, round, and reactive to light.  Neck: Normal range of motion.  Cardiovascular: Normal rate, regular rhythm and normal heart sounds.  Pulmonary/Chest: Effort normal and breath sounds normal. No stridor. No respiratory distress.  Abdominal: Soft. Bowel sounds are normal. There is no tenderness. There is no rebound.  Musculoskeletal: Normal range of motion.  Neurological: He is alert and oriented to person, place, and time.  Skin: Skin is warm and dry.  Psychiatric: He has a normal mood and affect.  Nursing note and vitals reviewed.    ED Treatments / Results  Labs (all labs ordered are listed, but only abnormal results are displayed) Labs Reviewed  CBC WITH DIFFERENTIAL/PLATELET - Abnormal; Notable for the following components:      Result Value   RBC 3.99 (*)     Hemoglobin 9.9 (*)    HCT 32.2 (*)    MCH 24.8 (*)    All other components within normal limits  COMPREHENSIVE METABOLIC PANEL - Abnormal; Notable for the following components:   Sodium 132 (*)    Chloride 94 (*)    Glucose, Bld 1,073 (*)    Creatinine, Ser 1.32 (*)    GFR calc non Af Amer 59 (*)    All other components within normal limits  URINALYSIS, ROUTINE W REFLEX MICROSCOPIC - Abnormal; Notable for the following components:   Color, Urine COLORLESS (*)    Glucose, UA >=500 (*)    All other components within normal limits  CBG MONITORING, ED - Abnormal; Notable for the following components:   Glucose-Capillary >600 (*)    All  other components within normal limits  CBG MONITORING, ED - Abnormal; Notable for the following components:   Glucose-Capillary 300 (*)    All other components within normal limits  I-STAT VENOUS BLOOD GAS, ED    EKG  EKG Interpretation None       Radiology No results found.  Procedures Procedures (including critical care time)  Medications Ordered in ED Medications  sodium chloride 0.9 % bolus 1,000 mL (0 mLs Intravenous Stopped 02/25/17 2346)  sodium chloride 0.9 % bolus 1,000 mL (0 mLs Intravenous Stopped 02/26/17 0059)  sodium chloride 0.9 % bolus 1,000 mL (0 mLs Intravenous Stopped 02/26/17 0207)  insulin aspart (novoLOG) injection 20 Units (20 Units Intravenous Given 02/26/17 0117)     Initial Impression / Assessment and Plan / ED Course  I have reviewed the triage vital signs and the nursing notes.  Pertinent labs & imaging results that were available during my care of the patient were reviewed by me and considered in my medical decision making (see chart for details).  57 year old male here with hyperglycemia.  Seen here multiple times for same.  He is nontoxic in appearance here.  Does appear somewhat clinically dry.  Screening labs with hyperglycemia with blood sugar over 1000, however normal anion gap and bicarb.  VBG is  reassuring.  Slight bump in creatinine which I suspect is from degree of dehydration.  He will be aggressively fluid resuscitated and given insulin to correct blood sugar.  2:57 AM After multiple liters of fluid and bolus of insulin, patient's blood sugar now 300, down from 1073.  His vitals remained stable.  Again, no signs of DKA.  Will discharge home.  Close follow-up with PCP-- see's [facility].  He understands to return here for any new or worsening symptoms.  Final Clinical Impressions(s) / ED Diagnoses   Final diagnoses:  Hyperglycemia    ED Discharge Orders    None       Larene Pickett, PA-C 02/26/17 3112    Charlesetta Shanks, MD 03/02/17 [phone]

## 2017-02-25 NOTE — ED Triage Notes (Signed)
Patient arrived with EMS from street ( homeless) reports elevated blood sugar with nausea today , CBG="High" , respirations unlabored / denies pain .

## 2017-02-26 LAB — CBG MONITORING, ED
Glucose-Capillary: 300 mg/dL — ABNORMAL HIGH (ref 65–99)
Glucose-Capillary: >600 mg/dL (ref 65–99)
Glucose-Capillary: >600 mg/dL (ref 65–99)

## 2017-02-26 MED ORDER — INSULIN ASPART 100 UNIT/ML ~~LOC~~ SOLN
20.0000 [IU] | Freq: Once | SUBCUTANEOUS | Status: AC
Start: 2017-02-26 — End: 2017-02-26
  Administered 2017-02-26: 20 [IU] via INTRAVENOUS
  Filled 2017-02-26: qty 1

## 2017-02-26 MED ORDER — SODIUM CHLORIDE 0.9 % IV BOLUS (SEPSIS)
1000.0000 mL | Freq: Once | INTRAVENOUS | Status: AC
  Administered 2017-02-26: 1000 mL via INTRAVENOUS

## 2017-02-26 NOTE — Discharge Instructions (Signed)
You need to take your home medications.  Watch your sugar/carb intake. Follow-up with your primary care doctor. Return here for any new concerns.

## 2017-02-26 NOTE — ED Notes (Addendum)
Mike KohlerL. Sanders PA notified on pt.'s persistent high CBG >600 .

## 2017-03-02 ENCOUNTER — Emergency Department (HOSPITAL_COMMUNITY)
Admission: EM | Admit: 2017-03-02 | Discharge: 2017-03-03 | Disposition: A | Discharge status: Home / Self Care | Payer: Self-pay | Attending: Emergency Medicine | Admitting: Emergency Medicine

## 2017-03-02 ENCOUNTER — Encounter (HOSPITAL_COMMUNITY): Payer: Self-pay | Admitting: Emergency Medicine

## 2017-03-02 ENCOUNTER — Other Ambulatory Visit: Payer: Self-pay

## 2017-03-02 DIAGNOSIS — R42 Dizziness and giddiness: Secondary | ICD-10-CM | POA: Insufficient documentation

## 2017-03-02 DIAGNOSIS — R739 Hyperglycemia, unspecified: Secondary | ICD-10-CM

## 2017-03-02 DIAGNOSIS — R197 Diarrhea, unspecified: Secondary | ICD-10-CM | POA: Insufficient documentation

## 2017-03-02 DIAGNOSIS — Z590 Homelessness: Secondary | ICD-10-CM | POA: Insufficient documentation

## 2017-03-02 DIAGNOSIS — F17210 Nicotine dependence, cigarettes, uncomplicated: Secondary | ICD-10-CM | POA: Insufficient documentation

## 2017-03-02 DIAGNOSIS — R112 Nausea with vomiting, unspecified: Secondary | ICD-10-CM | POA: Insufficient documentation

## 2017-03-02 DIAGNOSIS — I10 Essential (primary) hypertension: Secondary | ICD-10-CM | POA: Insufficient documentation

## 2017-03-02 DIAGNOSIS — Z794 Long term (current) use of insulin: Secondary | ICD-10-CM | POA: Insufficient documentation

## 2017-03-02 DIAGNOSIS — E1165 Type 2 diabetes mellitus with hyperglycemia: Secondary | ICD-10-CM | POA: Insufficient documentation

## 2017-03-02 LAB — CBG MONITORING, ED
GLUCOSE-CAPILLARY: 572 mg/dL — AB (ref 65–99)
Glucose-Capillary: >600 mg/dL (ref 65–99)

## 2017-03-02 LAB — BASIC METABOLIC PANEL
ANION GAP: 13 (ref 5–15)
BUN: 23 mg/dL — AB (ref 6–20)
CO2: 25 mmol/L (ref 22–32)
Calcium: 9.5 mg/dL (ref 8.9–10.3)
Chloride: 100 mmol/L — ABNORMAL LOW (ref 101–111)
Creatinine, Ser: 1.39 mg/dL — ABNORMAL HIGH (ref 0.61–1.24)
GFR calc Af Amer: >60 mL/min (ref >60)
GFR calc non Af Amer: 56 mL/min — ABNORMAL LOW (ref >60)
GLUCOSE: 944 mg/dL — AB (ref 65–99)
POTASSIUM: 4.8 mmol/L (ref 3.5–5.1)
Sodium: 138 mmol/L (ref 135–145)

## 2017-03-02 LAB — CBC WITH DIFFERENTIAL/PLATELET
BASOS PCT: 0 %
Basophils Absolute: 0 10*3/uL (ref 0.0–0.1)
EOS ABS: 0.1 10*3/uL (ref 0.0–0.7)
Eosinophils Relative: 2 %
HCT: 33.3 % — ABNORMAL LOW (ref 39.0–52.0)
Hemoglobin: 10.4 g/dL — ABNORMAL LOW (ref 13.0–17.0)
Lymphocytes Relative: 28 %
Lymphs Abs: 1.5 10*3/uL (ref 0.7–4.0)
MCH: 25 pg — ABNORMAL LOW (ref 26.0–34.0)
MCHC: 31.2 g/dL (ref 30.0–36.0)
MCV: 80 fL (ref 78.0–100.0)
MONO ABS: 0.3 10*3/uL (ref 0.1–1.0)
MONOS PCT: 6 %
Neutro Abs: 3.4 10*3/uL (ref 1.7–7.7)
Neutrophils Relative %: 64 %
PLATELETS: 247 10*3/uL (ref 150–400)
RBC: 4.16 MIL/uL — ABNORMAL LOW (ref 4.22–5.81)
RDW: 14 % (ref 11.5–15.5)
WBC: 5.3 10*3/uL (ref 4.0–10.5)

## 2017-03-02 LAB — BLOOD GAS, VENOUS
ACID-BASE EXCESS: 0.4 mmol/L (ref 0.0–2.0)
BICARBONATE: 28.1 mmol/L — AB (ref 20.0–28.0)
FIO2: 21
O2 Saturation: 22 %
PATIENT TEMPERATURE: 98.6
pCO2, Ven: 66 mmHg — ABNORMAL HIGH (ref 44.0–60.0)
pH, Ven: 7.253 (ref 7.250–7.430)

## 2017-03-02 MED ORDER — SODIUM CHLORIDE 0.9 % IV BOLUS (SEPSIS)
1000.0000 mL | Freq: Once | INTRAVENOUS | Status: AC
  Administered 2017-03-02: 1000 mL via INTRAVENOUS

## 2017-03-02 MED ORDER — ONDANSETRON HCL 4 MG/2ML IJ SOLN
4.0000 mg | Freq: Once | INTRAMUSCULAR | Status: AC
  Administered 2017-03-02: 4 mg via INTRAVENOUS
  Filled 2017-03-02: qty 2

## 2017-03-02 MED ORDER — INSULIN ASPART 100 UNIT/ML ~~LOC~~ SOLN
10.0000 [IU] | Freq: Once | SUBCUTANEOUS | Status: AC
  Administered 2017-03-02: 10 [IU] via SUBCUTANEOUS
  Filled 2017-03-02: qty 1

## 2017-03-02 NOTE — ED Notes (Signed)
Bed: WA09 Expected date:  Expected time:  Means of arrival:  Comments: 57 yo M  hyperglycemia

## 2017-03-02 NOTE — Discharge Instructions (Signed)
Your evaluated in the emergency department for a high blood sugar.  You were given some fluids and medications to help lower your blood sugar.  You should take your home medication as prescribed.  Please also avoid high sugar content food and drinks.  Please follow-up with your doctor.

## 2017-03-02 NOTE — ED Triage Notes (Signed)
Pt brought in by EMS from [facility] with c/o emesis, no abdominal pain.  Pt is a known non-compliant with his diabetes care and treatment.  Pt's CBG was "HI" by EMS on scene.

## 2017-03-02 NOTE — ED Provider Notes (Signed)
[name] DEPT Provider Note   CSN: [id number] Arrival date & time: 03/02/17  2011     History   Chief Complaint Chief Complaint  Patient presents with  . Emesis    HPI Mike [name] is a 57 y.o. male.  The history is provided by the patient.  Emesis   This is a new problem. The current episode started 1 to 2 hours ago. The problem occurs 2 to 4 times per day. The problem has not changed since onset.The emesis has an appearance of stomach contents. There has been no fever. Pertinent negatives include no abdominal pain, no arthralgias, no chills, no cough, no fever and no headaches.    57 year old male sent in from shelter I believe for high blood sugar.  The patient states he is here for being dizzy and vomiting.  Patient has not had any vomiting since I have seen him.  His blood pressure is elevated.  I asked him if he has been taking his medicines and he shrugs and says sometimes.  He denies any fever any abdominal pain any diarrhea any problems urinating.  He is homeless.  On review of his notes he does come to the emergency department fairly frequently and has care plan in place that recommends not aggressively testing him.  Past Medical History:  Diagnosis Date  . Anxiety   . Anxiety   . Chronic lower back pain   . Depression   . DKA (diabetic ketoacidoses) ([place]) 07/30/2016  . Hyperlipemia   . Hypertension   . Migraine    "last one was ~ 4 yr ago" (12/23/2014)  . Seizures ([place])    "related to pills for anxiety; if I don't take the pills I'm suppose to take I'll have them" (12/23/2014)  . Type II diabetes mellitus [facility])     Patient Active Problem List   Diagnosis Date Noted  . Malnutrition of moderate degree 02/08/2017  . DKA (diabetic ketoacidoses) ([place]) 02/07/2017  . History of stroke 02/07/2017  . Hyperphosphatemia 12/13/2016  . Evaluation by psychiatric service required 11/30/2016  . Lactic acidosis 11/18/2016  . Normocytic  anemia 11/06/2016  . HLD (hyperlipidemia) 11/06/2016  . Adjustment disorder with other symptoms 11/05/2016  . Uncontrolled type 2 diabetes mellitus with hyperosmolar nonketotic hyperglycemia ([place]) 08/24/2016  . Essential hypertension 08/24/2016  . Acute ischemic stroke ([place])   . Protein-calorie malnutrition, severe 12/24/2014  . Homelessness 12/23/2014  . DM (diabetes mellitus) ([place]) 12/23/2014  . Intermittent palpitations 12/23/2014  . Tobacco abuse 12/23/2014  . Pleuritic chest pain 12/23/2014  . Abnormal EKG 12/23/2014  . Type II diabetes mellitus with renal manifestations [facility])     Past Surgical History:  Procedure Laterality Date  . NO PAST SURGERIES         Home Medications    Prior to Admission medications   Medication Sig Start Date End Date Taking? Authorizing Provider  atorvastatin (LIPITOR) 40 MG tablet Take 1 tablet (40 mg total) by mouth daily. 01/26/17   Cardama, Grayce Sessions, MD  blood glucose meter kit and supplies KIT Dispense based on patient and insurance preference. Use up to four times daily as directed. (FOR ICD-9 250.00, 250.01). 11/22/16   Velvet Bathe, MD  chlorthalidone (HYGROTEN) 100 MG tablet Take 1 tablet (100 mg total) by mouth daily. 02/13/17   Charlynne Cousins, MD  ferrous sulfate 325 (65 FE) MG tablet Take 1 tablet (325 mg total) daily by mouth. Patient taking differently: Take 325 mg by mouth  daily as needed (low iron).  11/23/16   Ward, Delice Bison, DO  insulin aspart protamine- aspart (NOVOLOG MIX 70/30) (70-30) 100 UNIT/ML injection Inject 0.3 mLs (30 Units total) into the skin 2 (two) times daily with a meal. 02/13/17   Charlynne Cousins, MD  lisinopril (PRINIVIL,ZESTRIL) 40 MG tablet Take 1 tablet (40 mg total) by mouth daily. 01/26/17   CardamaGrayce Sessions, MD  multivitamin (PROSIGHT) TABS tablet Take 1 tablet by mouth daily. Patient not taking: Reported on 01/30/2017 12/22/16   Rai, Vernelle Emerald, MD  protein supplement shake (PREMIER  PROTEIN) LIQD Take 325 mLs (11 oz total) by mouth 2 (two) times daily between meals. Patient not taking: Reported on 01/30/2017 12/22/16   Mendel Corning, MD    Family History Family History  Problem Relation Age of Onset  . Diabetes Mellitus II Mother   . Diabetes Mellitus II Father     Social History Social History   Tobacco Use  . Smoking status: Current Every Day Smoker    Packs/day: 0.10    Years: 40.00    Pack years: 4.00    Types: Cigarettes  . Smokeless tobacco: Never Used  Substance Use Topics  . Alcohol use: Yes    Alcohol/week: 1.2 oz    Types: 2 Cans of beer per week  . Drug use: No    Comment: 12/23/2014 "stopped ~ 10 yrs ago"     Allergies   Ibuprofen and Tylenol [acetaminophen]   Review of Systems Review of Systems  Constitutional: Negative for chills and fever.  HENT: Negative for ear pain and sore throat.   Eyes: Negative for pain, discharge and visual disturbance.  Respiratory: Negative for cough and shortness of breath.   Cardiovascular: Negative for chest pain and palpitations.  Gastrointestinal: Positive for vomiting. Negative for abdominal pain.  Genitourinary: Negative for dysuria and hematuria.  Musculoskeletal: Negative for arthralgias and back pain.  Skin: Negative for color change and rash.  Neurological: Positive for dizziness. Negative for seizures, syncope and headaches.  All other systems reviewed and are negative.    Physical Exam Updated Vital Signs BP (!) 192/84 (BP Location: Right Arm)   Pulse 81   Temp 98.6 F (37 C) (Oral)   Resp 18   Ht _0  (1.93 m)   Wt 77.1 kg (170 lb)   SpO2 100%   BMI 20.69 kg/m   Physical Exam  Constitutional: He appears well-developed and well-nourished.  HENT:  Head: Normocephalic and atraumatic.  Eyes: Conjunctivae are normal. Right eye exhibits no discharge. Left eye exhibits no discharge.  Neck: Normal range of motion. Neck supple.  Cardiovascular: Normal rate and regular rhythm.  No  murmur heard. Pulmonary/Chest: Effort normal and breath sounds normal. No respiratory distress.  Abdominal: Soft. There is no tenderness.  Musculoskeletal: Normal range of motion. He exhibits no edema.  Neurological: He is alert.  Skin: Skin is warm and dry.  Psychiatric: He has a normal mood and affect.  Nursing note and vitals reviewed.    ED Treatments / Results  Labs (all labs ordered are listed, but only abnormal results are displayed) Labs Reviewed  CBC WITH DIFFERENTIAL/PLATELET - Abnormal; Notable for the following components:      Result Value   RBC 4.16 (*)    Hemoglobin 10.4 (*)    HCT 33.3 (*)    MCH 25.0 (*)    All other components within normal limits  BLOOD GAS, VENOUS - Abnormal; Notable for the following components:  pCO2, Ven 66.0 (*)    Bicarbonate 28.1 (*)    All other components within normal limits  CBG MONITORING, ED - Abnormal; Notable for the following components:   Glucose-Capillary >600 (*)    All other components within normal limits  BASIC METABOLIC PANEL    EKG  EKG Interpretation None       Radiology No results found.  Procedures Procedures (including critical care time)  Medications Ordered in ED Medications  ondansetron (ZOFRAN) injection 4 mg (not administered)  sodium chloride 0.9 % bolus 1,000 mL (1,000 mLs Intravenous New Bag/Given 03/02/17 2105)     Initial Impression / Assessment and Plan / ED Course  I have reviewed the triage vital signs and the nursing notes.  Pertinent labs & imaging results that were available during my care of the patient were reviewed by me and considered in my medical decision making (see chart for details).  Clinical Course as of Mar 03 1202  Fri Mar 02, 2017  2203 Patient has a critical hig glucose of 944 with a normal gap and not acidotic by VBG.  He otherwise clinically looks dry but well.  He is got a liter of fluid of ordering some insulin and another liter of fluid.  It looks like last  week he was in the ED with sugars over thousand.  It seems like he tolerates this fairly well.  And likely he is not compliant with his treatment.  [MB]  2329 Patient is a care plan in place and they recommend excluding a diabetic emergency and if there is known that he can be discharged and he supposed to follow-[facility] with [facility] for his [facility].  His sugar is improved to 572.  Will recheck another blood sugar and 1/2-hour but likely he is to be discharged.  [MB]    Clinical Course User Index [MB] Hayden Rasmussen, MD      Final Clinical Impressions(s) / ED Diagnoses   Final diagnoses:  Hyperglycemia  Non-intractable vomiting with nausea, unspecified vomiting type    ED Discharge Orders    None       Hayden Rasmussen, MD 03/03/17 1205

## 2017-03-03 ENCOUNTER — Other Ambulatory Visit: Payer: Self-pay

## 2017-03-03 ENCOUNTER — Encounter (HOSPITAL_COMMUNITY): Payer: Self-pay | Admitting: *Deleted

## 2017-03-03 ENCOUNTER — Emergency Department (HOSPITAL_COMMUNITY)
Admission: EM | Admit: 2017-03-03 | Discharge: 2017-03-03 | Disposition: A | Discharge status: Home / Self Care | Payer: Self-pay | Attending: Emergency Medicine | Admitting: Emergency Medicine

## 2017-03-03 DIAGNOSIS — I10 Essential (primary) hypertension: Secondary | ICD-10-CM | POA: Insufficient documentation

## 2017-03-03 DIAGNOSIS — Z8673 Personal history of transient ischemic attack (TIA), and cerebral infarction without residual deficits: Secondary | ICD-10-CM | POA: Insufficient documentation

## 2017-03-03 DIAGNOSIS — E1165 Type 2 diabetes mellitus with hyperglycemia: Secondary | ICD-10-CM | POA: Insufficient documentation

## 2017-03-03 DIAGNOSIS — E785 Hyperlipidemia, unspecified: Secondary | ICD-10-CM | POA: Insufficient documentation

## 2017-03-03 DIAGNOSIS — R030 Elevated blood-pressure reading, without diagnosis of hypertension: Secondary | ICD-10-CM

## 2017-03-03 DIAGNOSIS — F17210 Nicotine dependence, cigarettes, uncomplicated: Secondary | ICD-10-CM | POA: Insufficient documentation

## 2017-03-03 DIAGNOSIS — Z79899 Other long term (current) drug therapy: Secondary | ICD-10-CM | POA: Insufficient documentation

## 2017-03-03 DIAGNOSIS — R739 Hyperglycemia, unspecified: Secondary | ICD-10-CM

## 2017-03-03 DIAGNOSIS — Z794 Long term (current) use of insulin: Secondary | ICD-10-CM | POA: Insufficient documentation

## 2017-03-03 LAB — CBG MONITORING, ED: Glucose-Capillary: 384 mg/dL — ABNORMAL HIGH (ref 65–99)

## 2017-03-03 NOTE — ED Provider Notes (Signed)
[facility] DEPT Provider Note   CSN: [id number] Arrival date & time: 03/03/17  1338     History   Chief Complaint Chief Complaint  Patient presents with  . Hypertension    HPI Mike [name] is a 57 y.o. male who presents to the ED stating he does not know what he is here for. He denies pain. Patient was evaluated for hyperglycemia last night. This is a chronic problem with this patient and his blood sugar runs extremely high. Patient has a care plan in place that recommends excluding a diabetic emergency unless the patient has physical symptoms and appears ill. Patient reports that he got a bus pass to go back to the shelter but they wouldn't let him on the bus this morning so he came back here. Patient states his blood pressure is high.   HPI  Past Medical History:  Diagnosis Date  . Anxiety   . Anxiety   . Chronic lower back pain   . Depression   . DKA (diabetic ketoacidoses) ([place]) 07/30/2016  . Hyperlipemia   . Hypertension   . Migraine    "last one was ~ 4 yr ago" (12/23/2014)  . Seizures ([place])    "related to pills for anxiety; if I don't take the pills I'm suppose to take I'll have them" (12/23/2014)  . Type II diabetes mellitus [facility])     Patient Active Problem List   Diagnosis Date Noted  . Malnutrition of moderate degree 02/08/2017  . DKA (diabetic ketoacidoses) ([place]) 02/07/2017  . History of stroke 02/07/2017  . Hyperphosphatemia 12/13/2016  . Evaluation by psychiatric service required 11/30/2016  . Lactic acidosis 11/18/2016  . Normocytic anemia 11/06/2016  . HLD (hyperlipidemia) 11/06/2016  . Adjustment disorder with other symptoms 11/05/2016  . Uncontrolled type 2 diabetes mellitus with hyperosmolar nonketotic hyperglycemia ([place]) 08/24/2016  . Essential hypertension 08/24/2016  . Acute ischemic stroke ([place])   . Protein-calorie malnutrition, severe 12/24/2014  . Homelessness 12/23/2014  . DM (diabetes mellitus) ([place])  12/23/2014  . Intermittent palpitations 12/23/2014  . Tobacco abuse 12/23/2014  . Pleuritic chest pain 12/23/2014  . Abnormal EKG 12/23/2014  . Type II diabetes mellitus with renal manifestations [facility])     Past Surgical History:  Procedure Laterality Date  . NO PAST SURGERIES         Home Medications    Prior to Admission medications   Medication Sig Start Date End Date Taking? Authorizing Provider  atorvastatin (LIPITOR) 40 MG tablet Take 1 tablet (40 mg total) by mouth daily. 01/26/17   Cardama, Grayce Sessions, MD  blood glucose meter kit and supplies KIT Dispense based on patient and insurance preference. Use up to four times daily as directed. (FOR ICD-9 250.00, 250.01). 11/22/16   Velvet Bathe, MD  chlorthalidone (HYGROTEN) 100 MG tablet Take 1 tablet (100 mg total) by mouth daily. 02/13/17   Charlynne Cousins, MD  ferrous sulfate 325 (65 FE) MG tablet Take 1 tablet (325 mg total) daily by mouth. Patient taking differently: Take 325 mg by mouth daily as needed (low iron).  11/23/16   Ward, Delice Bison, DO  insulin aspart protamine- aspart (NOVOLOG MIX 70/30) (70-30) 100 UNIT/ML injection Inject 0.3 mLs (30 Units total) into the skin 2 (two) times daily with a meal. 02/13/17   Charlynne Cousins, MD  lisinopril (PRINIVIL,ZESTRIL) 40 MG tablet Take 1 tablet (40 mg total) by mouth daily. 01/26/17   Fatima Blank, MD  multivitamin (PROSIGHT) TABS tablet Take  1 tablet by mouth daily. Patient not taking: Reported on 01/30/2017 12/22/16   Rai, Vernelle Emerald, MD  protein supplement shake (PREMIER PROTEIN) LIQD Take 325 mLs (11 oz total) by mouth 2 (two) times daily between meals. Patient not taking: Reported on 01/30/2017 12/22/16   Mendel Corning, MD    Family History Family History  Problem Relation Age of Onset  . Diabetes Mellitus II Mother   . Diabetes Mellitus II Father     Social History Social History   Tobacco Use  . Smoking status: Current Every Day Smoker     Packs/day: 0.10    Years: 40.00    Pack years: 4.00    Types: Cigarettes  . Smokeless tobacco: Never Used  Substance Use Topics  . Alcohol use: Yes    Alcohol/week: 1.2 oz    Types: 2 Cans of beer per week  . Drug use: No    Comment: 12/23/2014 "stopped ~ 10 yrs ago"     Allergies   Ibuprofen and Tylenol [acetaminophen]   Review of Systems Review of Systems  Constitutional: Negative for chills and fever.  HENT: Negative.   Eyes: Negative for visual disturbance.  Respiratory: Negative for cough and shortness of breath.   Cardiovascular: Negative for chest pain.  Gastrointestinal: Negative for abdominal pain, nausea and vomiting.  Genitourinary: Negative for dysuria and frequency.  Musculoskeletal: Negative for back pain.  Skin: Negative for rash.  Neurological: Negative for headaches.     Physical Exam Updated Vital Signs BP (!) 181/75 (BP Location: Left Arm)   Pulse 66   Temp 97.7 F (36.5 C) (Oral)   Resp 16   Ht '6\' 4"'$  (1.93 m)   Wt 77.1 kg (170 lb)   SpO2 98%   BMI 20.69 kg/m   Physical Exam  Constitutional: He appears well-developed and well-nourished. No distress.  HENT:  Head: Normocephalic and atraumatic.  Eyes: EOM are normal.  Neck: Neck supple.  Cardiovascular: Normal rate.  Pulmonary/Chest: Effort normal.  Abdominal: There is no tenderness.  Musculoskeletal: Normal range of motion.  Neurological: He is alert.  Nursing note and vitals reviewed.    ED Treatments / Results  Labs (all labs ordered are listed, but only abnormal results are displayed) Labs Reviewed  CBG MONITORING, ED - Abnormal; Notable for the following components:      Result Value   Glucose-Capillary 384 (*)    All other components within normal limits   Radiology No results found.  Procedures Procedures (including critical care time)  Medications Ordered in ED Medications - No data to display   Initial Impression / Assessment and Plan / ED Course  I have  reviewed the triage vital signs and the nursing notes. 57 y.o. male with multiple visits to the ED and here tonight stating he could not get a bus back to the shelter so he came back here. Patient reports he has not eaten and is hungry. He denies other problems at this time. Blood sugar has improved since patient was here less than 24 hours ago. Stable for d/c. Patient given food and PO fluids.   Final Clinical Impressions(s) / ED Diagnoses   Final diagnoses:  Hyperglycemia  Elevated blood pressure reading    ED Discharge Orders    None       Debroah Baller [place], [place] 03/03/17 2118    Virgel Manifold, MD 03/04/17 [phone]

## 2017-03-03 NOTE — ED Notes (Signed)
PT DISCHARGED. INSTRUCTIONS GIVEN. AAOX4. PT IN NO APPARENT DISTRESS OR PAIN. THE OPPORTUNITY TO ASK QUESTIONS WAS PROVIDED. 

## 2017-03-03 NOTE — Discharge Instructions (Signed)
Follow up with your health care provider. Return here as needed.  °

## 2017-03-03 NOTE — ED Triage Notes (Signed)
Pt discharged this morning with bus pass, returns stating he was not allowed on bus and his blood pressure is high.

## 2017-03-04 ENCOUNTER — Encounter (HOSPITAL_COMMUNITY): Payer: Self-pay | Admitting: Family Medicine

## 2017-03-04 ENCOUNTER — Inpatient Hospital Stay (HOSPITAL_COMMUNITY)
Admission: EM | Admit: 2017-03-04 | Discharge: 2017-03-07 | DRG: 638 | Disposition: A | Discharge status: Home / Self Care | Payer: Self-pay | Attending: Internal Medicine | Admitting: Internal Medicine

## 2017-03-04 DIAGNOSIS — Z8673 Personal history of transient ischemic attack (TIA), and cerebral infarction without residual deficits: Secondary | ICD-10-CM

## 2017-03-04 DIAGNOSIS — E860 Dehydration: Secondary | ICD-10-CM | POA: Diagnosis present

## 2017-03-04 DIAGNOSIS — E785 Hyperlipidemia, unspecified: Secondary | ICD-10-CM | POA: Diagnosis present

## 2017-03-04 DIAGNOSIS — Z886 Allergy status to analgesic agent status: Secondary | ICD-10-CM

## 2017-03-04 DIAGNOSIS — F17210 Nicotine dependence, cigarettes, uncomplicated: Secondary | ICD-10-CM | POA: Diagnosis present

## 2017-03-04 DIAGNOSIS — M545 Low back pain: Secondary | ICD-10-CM | POA: Diagnosis present

## 2017-03-04 DIAGNOSIS — Z9119 Patient's noncompliance with other medical treatment and regimen: Secondary | ICD-10-CM

## 2017-03-04 DIAGNOSIS — R569 Unspecified convulsions: Secondary | ICD-10-CM | POA: Diagnosis present

## 2017-03-04 DIAGNOSIS — E1110 Type 2 diabetes mellitus with ketoacidosis without coma: Principal | ICD-10-CM | POA: Diagnosis present

## 2017-03-04 DIAGNOSIS — Z6820 Body mass index (BMI) 20.0-20.9, adult: Secondary | ICD-10-CM

## 2017-03-04 DIAGNOSIS — E119 Type 2 diabetes mellitus without complications: Secondary | ICD-10-CM

## 2017-03-04 DIAGNOSIS — E440 Moderate protein-calorie malnutrition: Secondary | ICD-10-CM | POA: Diagnosis present

## 2017-03-04 DIAGNOSIS — E870 Hyperosmolality and hypernatremia: Secondary | ICD-10-CM | POA: Diagnosis present

## 2017-03-04 DIAGNOSIS — Z833 Family history of diabetes mellitus: Secondary | ICD-10-CM

## 2017-03-04 DIAGNOSIS — F4320 Adjustment disorder, unspecified: Secondary | ICD-10-CM | POA: Diagnosis present

## 2017-03-04 DIAGNOSIS — I10 Essential (primary) hypertension: Secondary | ICD-10-CM | POA: Diagnosis present

## 2017-03-04 DIAGNOSIS — Z794 Long term (current) use of insulin: Secondary | ICD-10-CM

## 2017-03-04 DIAGNOSIS — G8929 Other chronic pain: Secondary | ICD-10-CM | POA: Diagnosis present

## 2017-03-04 DIAGNOSIS — Z590 Homelessness: Secondary | ICD-10-CM

## 2017-03-04 DIAGNOSIS — Z9114 Patient's other noncompliance with medication regimen: Secondary | ICD-10-CM

## 2017-03-04 DIAGNOSIS — I160 Hypertensive urgency: Secondary | ICD-10-CM | POA: Diagnosis present

## 2017-03-04 DIAGNOSIS — N179 Acute kidney failure, unspecified: Secondary | ICD-10-CM | POA: Diagnosis present

## 2017-03-04 DIAGNOSIS — D649 Anemia, unspecified: Secondary | ICD-10-CM | POA: Diagnosis present

## 2017-03-04 LAB — CBC
HEMATOCRIT: 35.9 % — AB (ref 39.0–52.0)
Hemoglobin: 10.8 g/dL — ABNORMAL LOW (ref 13.0–17.0)
MCH: 24.6 pg — ABNORMAL LOW (ref 26.0–34.0)
MCHC: 30.1 g/dL (ref 30.0–36.0)
MCV: 81.8 fL (ref 78.0–100.0)
PLATELETS: 236 10*3/uL (ref 150–400)
RBC: 4.39 MIL/uL (ref 4.22–5.81)
RDW: 14.5 % (ref 11.5–15.5)
WBC: 7.2 10*3/uL (ref 4.0–10.5)

## 2017-03-04 LAB — I-STAT VENOUS BLOOD GAS, ED
Acid-base deficit: 8 mmol/L — ABNORMAL HIGH (ref 0.0–2.0)
BICARBONATE: 17.2 mmol/L — AB (ref 20.0–28.0)
O2 SAT: 86 %
TCO2: 18 mmol/L — AB (ref 22–32)
pCO2, Ven: 34.2 mmHg — ABNORMAL LOW (ref 44.0–60.0)
pH, Ven: 7.31 (ref 7.250–7.430)
pO2, Ven: 56 mmHg — ABNORMAL HIGH (ref 32.0–45.0)

## 2017-03-04 LAB — CBG MONITORING, ED
Glucose-Capillary: 596 mg/dL (ref 65–99)
Glucose-Capillary: >600 mg/dL (ref 65–99)

## 2017-03-04 LAB — URINALYSIS, ROUTINE W REFLEX MICROSCOPIC
BACTERIA UA: NONE SEEN
BILIRUBIN URINE: NEGATIVE
Ketones, ur: 20 mg/dL — AB
LEUKOCYTES UA: NEGATIVE
NITRITE: NEGATIVE
PH: 5 (ref 5.0–8.0)
Protein, ur: NEGATIVE mg/dL
SPECIFIC GRAVITY, URINE: 1.027 (ref 1.005–1.030)

## 2017-03-04 LAB — BASIC METABOLIC PANEL
Anion gap: 21 — ABNORMAL HIGH (ref 5–15)
BUN: 32 mg/dL — AB (ref 6–20)
CALCIUM: 9.4 mg/dL (ref 8.9–10.3)
CO2: 16 mmol/L — ABNORMAL LOW (ref 22–32)
CREATININE: 1.77 mg/dL — AB (ref 0.61–1.24)
Chloride: 105 mmol/L (ref 101–111)
GFR calc Af Amer: 48 mL/min — ABNORMAL LOW (ref >60)
GFR, EST NON AFRICAN AMERICAN: 42 mL/min — AB (ref >60)
GLUCOSE: 760 mg/dL — AB (ref 65–99)
Potassium: 4.2 mmol/L (ref 3.5–5.1)
SODIUM: 142 mmol/L (ref 135–145)

## 2017-03-04 MED ORDER — SODIUM CHLORIDE 0.9 % IV BOLUS (SEPSIS)
1000.0000 mL | Freq: Once | INTRAVENOUS | Status: AC
  Administered 2017-03-04: 1000 mL via INTRAVENOUS

## 2017-03-04 MED ORDER — SODIUM CHLORIDE 0.9 % IV SOLN
INTRAVENOUS | Status: DC
  Administered 2017-03-04: 5.4 [IU]/h via INTRAVENOUS
  Filled 2017-03-04: qty 1

## 2017-03-04 MED ORDER — SODIUM CHLORIDE 0.9 % IV SOLN
INTRAVENOUS | Status: DC
Start: 2017-03-05 — End: 2017-03-05

## 2017-03-04 MED ORDER — SODIUM CHLORIDE 0.9 % IV BOLUS (SEPSIS)
1000.0000 mL | Freq: Once | INTRAVENOUS | Status: AC
  Administered 2017-03-05: 1000 mL via INTRAVENOUS

## 2017-03-04 MED ORDER — DEXTROSE-NACL 5-0.45 % IV SOLN: INTRAVENOUS | Status: DC

## 2017-03-04 NOTE — ED Notes (Signed)
Critical CBG reported to Levander CampionUpstill, Sheri

## 2017-03-04 NOTE — ED Notes (Signed)
CBG Read- HIGH

## 2017-03-04 NOTE — ED Notes (Signed)
IV nurse currently at bedside.

## 2017-03-04 NOTE — ED Provider Notes (Signed)
[facility] EMERGENCY DEPARTMENT Provider Note   CSN: [id number] Arrival date & time: 03/04/17  2113     History   Chief Complaint Chief Complaint  Patient presents with  . Dizziness  . Hyperglycemia    HPI Mike [name] is a 57 y.o. male.  Patient with a T2DM, HTN, HLD, seizures, chronic medication noncompliance, presents with dizziness and high blood sugar. The patient denies pain, vomiting, fever. He reports having "the shakes" but cannot quantify how long he has had them. He does not provide detailed history. He has been ambulatory since arrival.   The history is provided by the patient and the EMS personnel. No language interpreter was used.  Dizziness  Hyperglycemia  Associated symptoms: dizziness     Past Medical History:  Diagnosis Date  . Anxiety   . Anxiety   . Chronic lower back pain   . Depression   . DKA (diabetic ketoacidoses) ([place]) 07/30/2016  . Hyperlipemia   . Hypertension   . Migraine    "last one was ~ 4 yr ago" (12/23/2014)  . Seizures ([place])    "related to pills for anxiety; if I don't take the pills I'm suppose to take I'll have them" (12/23/2014)  . Type II diabetes mellitus [facility])     Patient Active Problem List   Diagnosis Date Noted  . Malnutrition of moderate degree 02/08/2017  . DKA (diabetic ketoacidoses) ([place]) 02/07/2017  . History of stroke 02/07/2017  . Hyperphosphatemia 12/13/2016  . Evaluation by psychiatric service required 11/30/2016  . Lactic acidosis 11/18/2016  . Normocytic anemia 11/06/2016  . HLD (hyperlipidemia) 11/06/2016  . Adjustment disorder with other symptoms 11/05/2016  . Uncontrolled type 2 diabetes mellitus with hyperosmolar nonketotic hyperglycemia ([place]) 08/24/2016  . Essential hypertension 08/24/2016  . Acute ischemic stroke ([place])   . Protein-calorie malnutrition, severe 12/24/2014  . Homelessness 12/23/2014  . DM (diabetes mellitus) ([employer]) 12/23/2014  . Intermittent palpitations  12/23/2014  . Tobacco abuse 12/23/2014  . Pleuritic chest pain 12/23/2014  . Abnormal EKG 12/23/2014  . Type II diabetes mellitus with renal manifestations [facility])     Past Surgical History:  Procedure Laterality Date  . NO PAST SURGERIES         Home Medications    Prior to Admission medications   Medication Sig Start Date End Date Taking? Authorizing Provider  atorvastatin (LIPITOR) 40 MG tablet Take 1 tablet (40 mg total) by mouth daily. 01/26/17   Cardama, Grayce Sessions, MD  blood glucose meter kit and supplies KIT Dispense based on patient and insurance preference. Use up to four times daily as directed. (FOR ICD-9 250.00, 250.01). 11/22/16   Velvet Bathe, MD  chlorthalidone (HYGROTEN) 100 MG tablet Take 1 tablet (100 mg total) by mouth daily. 02/13/17   Charlynne Cousins, MD  ferrous sulfate 325 (65 FE) MG tablet Take 1 tablet (325 mg total) daily by mouth. Patient taking differently: Take 325 mg by mouth daily as needed (low iron).  11/23/16   Ward, Delice Bison, DO  insulin aspart protamine- aspart (NOVOLOG MIX 70/30) (70-30) 100 UNIT/ML injection Inject 0.3 mLs (30 Units total) into the skin 2 (two) times daily with a meal. 02/13/17   Charlynne Cousins, MD  lisinopril (PRINIVIL,ZESTRIL) 40 MG tablet Take 1 tablet (40 mg total) by mouth daily. 01/26/17   CardamaGrayce Sessions, MD  multivitamin (PROSIGHT) TABS tablet Take 1 tablet by mouth daily. Patient not taking: Reported on 01/30/2017 12/22/16   Mendel Corning, MD  protein supplement shake (PREMIER PROTEIN) LIQD Take 325 mLs (11 oz total) by mouth 2 (two) times daily between meals. Patient not taking: Reported on 01/30/2017 12/22/16   Mendel Corning, MD    Family History Family History  Problem Relation Age of Onset  . Diabetes Mellitus II Mother   . Diabetes Mellitus II Father     Social History Social History   Tobacco Use  . Smoking status: Current Every Day Smoker    Packs/day: 0.10    Years: 40.00    Pack  years: 4.00    Types: Cigarettes  . Smokeless tobacco: Never Used  Substance Use Topics  . Alcohol use: Yes    Alcohol/week: 1.2 oz    Types: 2 Cans of beer per week  . Drug use: No    Comment: 12/23/2014 "stopped ~ 10 yrs ago"     Allergies   Ibuprofen and Tylenol [acetaminophen]   Review of Systems Review of Systems  Reason unable to perform ROS: Patient does not provide detailed history.  Cardiovascular:       Denies pain  Gastrointestinal:       Denies pain, vomiting  Genitourinary:       Does not endorse or deny urinary frequency  Musculoskeletal:       Denies pain  Neurological: Positive for dizziness. Negative for seizures.     Physical Exam Updated Vital Signs BP (!) 198/91 (BP Location: Left Arm)   Pulse 82   Temp 98.2 F (36.8 C) (Oral)   Resp 16   Ht _0  (1.93 m)   Wt 77.1 kg (170 lb)   SpO2 100%   BMI 20.69 kg/m   Physical Exam  Constitutional: He appears well-developed and well-nourished.  Poor general hygiene  HENT:  Head: Normocephalic.  Poor dentition.  Neck: Normal range of motion. Neck supple.  Cardiovascular: Normal rate and regular rhythm.  Pulmonary/Chest: Effort normal and breath sounds normal. He has no wheezes. He has no rales.  Abdominal: Soft. Bowel sounds are normal. There is no tenderness. There is no rebound and no guarding.  Musculoskeletal: Normal range of motion.  Neurological: He is alert.  Skin: Skin is warm and dry. No rash noted.  Psychiatric: He has a normal mood and affect.     ED Treatments / Results  Labs (all labs ordered are listed, but only abnormal results are displayed) Labs Reviewed  BASIC METABOLIC PANEL - Abnormal; Notable for the following components:      Result Value   CO2 16 (*)    Glucose, Bld 760 (*)    BUN 32 (*)    Creatinine, Ser 1.77 (*)    GFR calc non Af Amer 42 (*)    GFR calc Af Amer 48 (*)    Anion gap 21 (*)    All other components within normal limits  CBC - Abnormal; Notable  for the following components:   Hemoglobin 10.8 (*)    HCT 35.9 (*)    MCH 24.6 (*)    All other components within normal limits  URINALYSIS, ROUTINE W REFLEX MICROSCOPIC - Abnormal; Notable for the following components:   Color, Urine STRAW (*)    Glucose, UA >=500 (*)    Hgb urine dipstick SMALL (*)    Ketones, ur 20 (*)    Squamous Epithelial / LPF 0-5 (*)    All other components within normal limits  CBG MONITORING, ED - Abnormal; Notable for the following components:   Glucose-Capillary >600 (*)  All other components within normal limits  BLOOD GAS, VENOUS    EKG  EKG Interpretation None       Radiology No results found.  Procedures Procedures (including critical care time) CRITICAL CARE Performed by: Dewaine Oats   Total critical care time: 25 minutes  Critical care time was exclusive of separately billable procedures and treating other patients.  Critical care was necessary to treat or prevent imminent or life-threatening deterioration.  Critical care was time spent personally by me on the following activities: development of treatment plan with patient and/or surrogate as well as nursing, discussions with consultants, evaluation of patient's response to treatment, examination of patient, obtaining history from patient or surrogate, ordering and performing treatments and interventions, ordering and review of laboratory studies, ordering and review of radiographic studies, pulse oximetry and re-evaluation of patient's condition.  Medications Ordered in ED Medications  insulin regular (NOVOLIN R,HUMULIN R) 100 Units in sodium chloride 0.9 % 100 mL (1 Units/mL) infusion (not administered)  sodium chloride 0.9 % bolus 1,000 mL (not administered)    And  sodium chloride 0.9 % bolus 1,000 mL (not administered)    And  0.9 %  sodium chloride infusion (not administered)  dextrose 5 %-0.45 % sodium chloride infusion (not administered)     Initial Impression /  Assessment and Plan / ED Course  I have reviewed the triage vital signs and the nursing notes.  Pertinent labs & imaging results that were available during my care of the patient were reviewed by me and considered in my medical decision making (see chart for details).     Patient presents with dizziness with high blood sugar. He is well known to the emergency department for poor diabetes management and chronic noncompliance. He is found to have a care plan.   Tonight his bicarb is low, anion gap is high in the setting of hyperglycemia, c/w DKA. IVF's started. He is put on the glucostabilizer. VSS, hypertensive. VBG pending.   Patient is admitted to [facility], Dr. Myna Hidalgo accepting. He is stable and in NAD.   Final Clinical Impressions(s) / ED Diagnoses   Final diagnoses:  None   1. DKA 2. Chronic noncompliance  ED Discharge Orders    None       Charlann Lange, Hershal Coria 03/04/17 2334    Varney Biles, MD 03/05/17 0020

## 2017-03-04 NOTE — ED Triage Notes (Signed)
Pt BIB [facility] from [facility]. Pt c/o dizziness and hyperglycemia. Pt was evaluated yesterday for the same. Pt was able to ambulate from stretcher to bathroom

## 2017-03-05 ENCOUNTER — Other Ambulatory Visit: Payer: Self-pay

## 2017-03-05 DIAGNOSIS — N179 Acute kidney failure, unspecified: Secondary | ICD-10-CM

## 2017-03-05 DIAGNOSIS — I10 Essential (primary) hypertension: Secondary | ICD-10-CM

## 2017-03-05 DIAGNOSIS — Z794 Long term (current) use of insulin: Secondary | ICD-10-CM

## 2017-03-05 DIAGNOSIS — I160 Hypertensive urgency: Secondary | ICD-10-CM

## 2017-03-05 DIAGNOSIS — E1169 Type 2 diabetes mellitus with other specified complication: Secondary | ICD-10-CM

## 2017-03-05 DIAGNOSIS — D649 Anemia, unspecified: Secondary | ICD-10-CM

## 2017-03-05 LAB — CBG MONITORING, ED
GLUCOSE-CAPILLARY: 126 mg/dL — AB (ref 65–99)
GLUCOSE-CAPILLARY: 199 mg/dL — AB (ref 65–99)
GLUCOSE-CAPILLARY: 376 mg/dL — AB (ref 65–99)
GLUCOSE-CAPILLARY: 428 mg/dL — AB (ref 65–99)
GLUCOSE-CAPILLARY: 53 mg/dL — AB (ref 65–99)
GLUCOSE-CAPILLARY: 58 mg/dL — AB (ref 65–99)
GLUCOSE-CAPILLARY: 87 mg/dL (ref 65–99)
Glucose-Capillary: 291 mg/dL — ABNORMAL HIGH (ref 65–99)
Glucose-Capillary: 214 mg/dL — ABNORMAL HIGH (ref 65–99)
Glucose-Capillary: 184 mg/dL — ABNORMAL HIGH (ref 65–99)
Glucose-Capillary: 160 mg/dL — ABNORMAL HIGH (ref 65–99)
Glucose-Capillary: 161 mg/dL — ABNORMAL HIGH (ref 65–99)
Glucose-Capillary: 234 mg/dL — ABNORMAL HIGH (ref 65–99)
Glucose-Capillary: 282 mg/dL — ABNORMAL HIGH (ref 65–99)

## 2017-03-05 LAB — BASIC METABOLIC PANEL
ANION GAP: 12 (ref 5–15)
ANION GAP: 10 (ref 5–15)
ANION GAP: 8 (ref 5–15)
ANION GAP: 10 (ref 5–15)
BUN: 23 mg/dL — ABNORMAL HIGH (ref 6–20)
BUN: 19 mg/dL (ref 6–20)
BUN: 16 mg/dL (ref 6–20)
BUN: 16 mg/dL (ref 6–20)
CALCIUM: 9.1 mg/dL (ref 8.9–10.3)
CALCIUM: 9.2 mg/dL (ref 8.9–10.3)
CALCIUM: 8.5 mg/dL — AB (ref 8.9–10.3)
CO2: 22 mmol/L (ref 22–32)
CO2: 23 mmol/L (ref 22–32)
CO2: 25 mmol/L (ref 22–32)
CO2: 21 mmol/L — ABNORMAL LOW (ref 22–32)
CREATININE: 0.93 mg/dL (ref 0.61–1.24)
Calcium: 9.1 mg/dL (ref 8.9–10.3)
Chloride: 120 mmol/L — ABNORMAL HIGH (ref 101–111)
Chloride: 121 mmol/L — ABNORMAL HIGH (ref 101–111)
Chloride: 122 mmol/L — ABNORMAL HIGH (ref 101–111)
Chloride: 115 mmol/L — ABNORMAL HIGH (ref 101–111)
Creatinine, Ser: 1.3 mg/dL — ABNORMAL HIGH (ref 0.61–1.24)
Creatinine, Ser: 1.12 mg/dL (ref 0.61–1.24)
Creatinine, Ser: 0.98 mg/dL (ref 0.61–1.24)
GFR calc non Af Amer: >60 mL/min (ref >60)
Glucose, Bld: 291 mg/dL — ABNORMAL HIGH (ref 65–99)
Glucose, Bld: 176 mg/dL — ABNORMAL HIGH (ref 65–99)
Glucose, Bld: 116 mg/dL — ABNORMAL HIGH (ref 65–99)
Glucose, Bld: 254 mg/dL — ABNORMAL HIGH (ref 65–99)
Potassium: 3.5 mmol/L (ref 3.5–5.1)
Potassium: 3.9 mmol/L (ref 3.5–5.1)
Potassium: 4 mmol/L (ref 3.5–5.1)
Potassium: 3.8 mmol/L (ref 3.5–5.1)
SODIUM: 154 mmol/L — AB (ref 135–145)
SODIUM: 154 mmol/L — AB (ref 135–145)
SODIUM: 155 mmol/L — AB (ref 135–145)
SODIUM: 146 mmol/L — AB (ref 135–145)

## 2017-03-05 LAB — GLUCOSE, CAPILLARY
GLUCOSE-CAPILLARY: 206 mg/dL — AB (ref 65–99)
Glucose-Capillary: 206 mg/dL — ABNORMAL HIGH (ref 65–99)
Glucose-Capillary: 228 mg/dL — ABNORMAL HIGH (ref 65–99)
Glucose-Capillary: 291 mg/dL — ABNORMAL HIGH (ref 65–99)

## 2017-03-05 LAB — MRSA PCR SCREENING: MRSA by PCR: NEGATIVE

## 2017-03-05 MED ORDER — SODIUM CHLORIDE 0.45 % IV SOLN
INTRAVENOUS | Status: DC
  Administered 2017-03-05: 1000 mL via INTRAVENOUS
  Administered 2017-03-06 (×2): via INTRAVENOUS

## 2017-03-05 MED ORDER — LISINOPRIL 40 MG PO TABS
40.0000 mg | ORAL_TABLET | Freq: Every day | ORAL | Status: DC
  Administered 2017-03-05 – 2017-03-07 (×3): 40 mg via ORAL
  Filled 2017-03-05: qty 2
  Filled 2017-03-05: qty 1
  Filled 2017-03-05: qty 2

## 2017-03-05 MED ORDER — HYDRALAZINE HCL 20 MG/ML IJ SOLN: 10.0000 mg | INTRAMUSCULAR | Status: DC | PRN

## 2017-03-05 MED ORDER — DEXTROSE-NACL 5-0.45 % IV SOLN
INTRAVENOUS | Status: DC
  Administered 2017-03-05: 04:00:00 via INTRAVENOUS

## 2017-03-05 MED ORDER — SODIUM CHLORIDE 0.9 % IV SOLN
INTRAVENOUS | Status: DC
  Administered 2017-03-05: 3.7 [IU]/h via INTRAVENOUS

## 2017-03-05 MED ORDER — INSULIN ASPART PROT & ASPART (70-30 MIX) 100 UNIT/ML ~~LOC~~ SUSP
30.0000 [IU] | Freq: Two times a day (BID) | SUBCUTANEOUS | Status: DC
  Administered 2017-03-05 – 2017-03-07 (×4): 30 [IU] via SUBCUTANEOUS
  Filled 2017-03-05: qty 10

## 2017-03-05 MED ORDER — ATORVASTATIN CALCIUM 40 MG PO TABS
40.0000 mg | ORAL_TABLET | Freq: Every day | ORAL | Status: DC
  Administered 2017-03-05 – 2017-03-06 (×2): 40 mg via ORAL
  Filled 2017-03-05 (×3): qty 1

## 2017-03-05 MED ORDER — PROSIGHT PO TABS
1.0000 | ORAL_TABLET | Freq: Every day | ORAL | Status: DC
  Administered 2017-03-05 – 2017-03-07 (×3): 1 via ORAL
  Filled 2017-03-05 (×4): qty 1

## 2017-03-05 MED ORDER — PREMIER PROTEIN SHAKE
11.0000 [oz_av] | Freq: Two times a day (BID) | ORAL | Status: DC
  Administered 2017-03-05 – 2017-03-06 (×3): 11 [oz_av] via ORAL
  Filled 2017-03-05 (×7): qty 325.31

## 2017-03-05 MED ORDER — HYDRALAZINE HCL 20 MG/ML IJ SOLN
10.0000 mg | INTRAMUSCULAR | Status: DC | PRN
  Administered 2017-03-05: 10 mg via INTRAVENOUS
  Filled 2017-03-05 (×2): qty 1

## 2017-03-05 MED ORDER — INSULIN GLARGINE 100 UNIT/ML ~~LOC~~ SOLN
10.0000 [IU] | Freq: Once | SUBCUTANEOUS | Status: AC
  Administered 2017-03-05: 10 [IU] via SUBCUTANEOUS
  Filled 2017-03-05: qty 0.1

## 2017-03-05 MED ORDER — SODIUM CHLORIDE 0.9 % IV SOLN
INTRAVENOUS | Status: DC
  Administered 2017-03-05: 03:00:00 via INTRAVENOUS

## 2017-03-05 MED ORDER — HEPARIN SODIUM (PORCINE) 5000 UNIT/ML IJ SOLN
5000.0000 [IU] | Freq: Three times a day (TID) | INTRAMUSCULAR | Status: DC
  Administered 2017-03-05 – 2017-03-07 (×6): 5000 [IU] via SUBCUTANEOUS
  Filled 2017-03-05 (×6): qty 1

## 2017-03-05 MED ORDER — POTASSIUM CHLORIDE 10 MEQ/100ML IV SOLN
10.0000 meq | INTRAVENOUS | Status: AC
  Administered 2017-03-05 (×2): 10 meq via INTRAVENOUS
  Filled 2017-03-05 (×2): qty 100

## 2017-03-05 MED ORDER — INSULIN ASPART 100 UNIT/ML ~~LOC~~ SOLN
0.0000 [IU] | SUBCUTANEOUS | Status: DC
  Administered 2017-03-05: 8 [IU] via SUBCUTANEOUS
  Administered 2017-03-05: 5 [IU] via SUBCUTANEOUS
  Administered 2017-03-05 (×2): 3 [IU] via SUBCUTANEOUS
  Administered 2017-03-06: 5 [IU] via SUBCUTANEOUS
  Administered 2017-03-06: 3 [IU] via SUBCUTANEOUS
  Administered 2017-03-06: 5 [IU] via SUBCUTANEOUS
  Filled 2017-03-05 (×2): qty 1

## 2017-03-05 NOTE — ED Notes (Signed)
Pt started vomiting.  

## 2017-03-05 NOTE — ED Notes (Signed)
Pt given breakfast tray with 114g carbs.  CBG to be rechecked at [facility]0920

## 2017-03-05 NOTE — Progress Notes (Signed)
57 yo male with multiple admissions for DKA admitted with the same.dka resolved.he is hypernatremic.will give ivf.restart 70/30.dw er nurse.

## 2017-03-05 NOTE — Progress Notes (Signed)
Inpatient Diabetes Program Recommendations  AACE/ADA: New Consensus Statement on Inpatient Glycemic Control (2015)  Target Ranges:  Prepandial:   less than 140 mg/dL      Peak postprandial:   less than 180 mg/dL (1-2 hours)      Critically ill patients:  140 - 180 mg/dL   Lab Results  Component Value Date   GLUCAP 58 (L) 03/05/2017   HGBA1C 10.9 (H) 02/07/2017    Review of Glycemic Control  Diabetes history: DM 2 Outpatient Diabetes medications: 70/30 30 units BID Current orders for Inpatient glycemic control: Novolog Moderate 0-15 units Q4hours  Inpatient Diabetes Program Recommendations:    Patient with hypoglycemia in the 50's this am. Prior to transitioning off of IV insulin, insulin gtt rate was consistently 3 units/hr. In previous admissions patient required Lantus 40 units, Novolog Resistant Correction + HS scale and potentially meal coverage.  Once glucose trends consistently are above 180 mg/dl, consider adding Lantus.  Patient is very familiar to our team and has been seen on multiple occassions.  Thanks,  Christena DeemShannon Tagen Brethauer RN, MSN, [facility] Inpatient Diabetes Coordinator Team Pager [phone] (8a-5p)

## 2017-03-05 NOTE — H&P (Signed)
History and Physical    Daymon Baquero WUJ:[id number] DOB: 01-10-1961 DOA: 03/04/2017  PCP: [name], NP   Patient coming from: Shelter   Chief Complaint: Malaise, "high" CBG   HPI: Rik Minahan is a 57 y.o. male with medical history significant for insulin-dependent diabetes mellitus, hypertension, chronic anemia, and homelessness, presenting from a shelter for evaluation of general malaise and CBG reading "high."  Patient had been seen in the emergency department yesterday for elevated glucose without DKA, was hydrated and discharged back to his shelter in stable and improved condition.  Since that time, he has developed a general malaise and his CBG read "high."  He denies fevers, chills, chest pain, headache, change in vision or hearing, or focal numbness or weakness.  Denies shortness of breath or cough.  Denies dysuria or flank pain.  No abdominal pain.  He is unable to say how much insulin he uses.   ED Course: Upon arrival to the ED, patient is found to be afebrile, saturating well on room air, hypertensive to 200/90, and vitals otherwise stable.  Chemistry panel reveals a serum glucose of 760 with bicarbonate of 16, anion gap 21, and creatinine 1.77, up from 1.39 two days ago and from a baseline of 1.  CBC features a stable normocytic anemia with hemoglobin 10.8. Urinalysis is notable for glucosuria and ketonuria.  He was treated with 2 L normal saline and started on insulin but he stable, in no apparent respiratory distress, and will be admitted to the stepdown unit for ongoing evaluation and management of DKA, suspected secondary to nonadherence with his insulin.  Review of Systems:  All other systems reviewed and apart from HPI, are negative.  Past Medical History:  Diagnosis Date  . Anxiety   . Anxiety   . Chronic lower back pain   . Depression   . DKA (diabetic ketoacidoses) ([place]) 07/30/2016  . Hyperlipemia   . Hypertension   . Migraine    "last one was ~ 4 yr  ago" (12/23/2014)  . Seizures ([place])    "related to pills for anxiety; if I don't take the pills I'm suppose to take I'll have them" (12/23/2014)  . Type II diabetes mellitus ([place])     Past Surgical History:  Procedure Laterality Date  . NO PAST SURGERIES       reports that he has been smoking cigarettes.  He has a 4.00 pack-year smoking history. he has never used smokeless tobacco. He reports that he drinks about 1.2 oz of alcohol per week. He reports that he does not use drugs.  Allergies  Allergen Reactions  . Ibuprofen Nausea And Vomiting and Other (See Comments)    Makes the patient feel bloated also  . Tylenol [Acetaminophen] Nausea And Vomiting and Other (See Comments)    Makes the patient feel bloated also    Family History  Problem Relation Age of Onset  . Diabetes Mellitus II Mother   . Diabetes Mellitus II Father      Prior to Admission medications   Medication Sig Start Date End Date Taking? Authorizing Provider  atorvastatin (LIPITOR) 40 MG tablet Take 1 tablet (40 mg total) by mouth daily. 01/26/17  Yes Cardama, Grayce Sessions, MD  blood glucose meter kit and supplies KIT Dispense based on patient and insurance preference. Use up to four times daily as directed. (FOR ICD-9 250.00, 250.01). 11/22/16  Yes Velvet Bathe, MD  chlorthalidone (HYGROTEN) 100 MG tablet Take 1 tablet (100 mg total) by mouth daily.  02/13/17  Yes Charlynne Cousins, MD  ferrous sulfate 325 (65 FE) MG tablet Take 1 tablet (325 mg total) daily by mouth. Patient taking differently: Take 325 mg by mouth daily as needed (low iron).  11/23/16  Yes Ward, Cyril Mourning N, DO  insulin aspart protamine- aspart (NOVOLOG MIX 70/30) (70-30) 100 UNIT/ML injection Inject 0.3 mLs (30 Units total) into the skin 2 (two) times daily with a meal. 02/13/17  Yes Charlynne Cousins, MD  lisinopril (PRINIVIL,ZESTRIL) 40 MG tablet Take 1 tablet (40 mg total) by mouth daily. 01/26/17  Yes Cardama, Grayce Sessions, MD  multivitamin  (PROSIGHT) TABS tablet Take 1 tablet by mouth daily. Patient not taking: Reported on 01/30/2017 12/22/16   Rai, Vernelle Emerald, MD  protein supplement shake (PREMIER PROTEIN) LIQD Take 325 mLs (11 oz total) by mouth 2 (two) times daily between meals. Patient not taking: Reported on 01/30/2017 12/22/16   Mendel Corning, MD    Physical Exam: Vitals:   03/04/17 2127 03/04/17 2130 03/04/17 2131  BP:   (!) 198/91  Pulse:   82  Resp:   16  Temp:   98.2 F (36.8 C)  TempSrc:   Oral  SpO2: 100%  100%  Weight:  77.1 kg (170 lb)   Height:  '6\' 4"'$  (1.93 m)       Constitutional: NAD, calm  Eyes: PERTLA, lids and conjunctivae normal ENMT: Mucous membranes are moist. Posterior pharynx clear of any exudate or lesions.   Neck: normal, supple, no masses, no thyromegaly Respiratory: clear to auscultation bilaterally, no wheezing, no crackles. No accessory muscle use.  Cardiovascular: S1 & S2 heard, regular rate and rhythm. 2+ pretibial pitting edema bilaterally. No significant JVD. Abdomen: No distension, no tenderness, soft. Bowel sounds normal.  Musculoskeletal: no clubbing / cyanosis. No joint deformity upper and lower extremities.    Skin: no significant rashes, lesions, ulcers. Warm, dry, well-perfused. Neurologic: CN 2-12 grossly intact. Sensation intact. Strength 5/5 in all 4 limbs.  Psychiatric: Alert and oriented x 3. Coopeartive.     Labs on Admission: I have personally reviewed following labs and imaging studies  CBC: Recent Labs  Lab 03/02/17 2059 03/04/17 2131  WBC 5.3 7.2  NEUTROABS 3.4  --   HGB 10.4* 10.8*  HCT 33.3* 35.9*  MCV 80.0 81.8  PLT 247 696   Basic Metabolic Panel: Recent Labs  Lab 03/02/17 2059 03/04/17 2131  NA 138 142  K 4.8 4.2  CL 100* 105  CO2 25 16*  GLUCOSE 944* 760*  BUN 23* 32*  CREATININE 1.39* 1.77*  CALCIUM 9.5 9.4   GFR: Estimated Creatinine Clearance: 51.4 mL/min (A) (by C-G formula based on SCr of 1.77 mg/dL (H)). Liver Function  Tests: No results for input(s): AST, ALT, ALKPHOS, BILITOT, PROT, ALBUMIN in the last 168 hours. No results for input(s): LIPASE, AMYLASE in the last 168 hours. No results for input(s): AMMONIA in the last 168 hours. Coagulation Profile: No results for input(s): INR, PROTIME in the last 168 hours. Cardiac Enzymes: No results for input(s): CKTOTAL, CKMB, CKMBINDEX, TROPONINI in the last 168 hours. BNP (last 3 results) No results for input(s): PROBNP in the last 8760 hours. HbA1C: No results for input(s): HGBA1C in the last 72 hours. CBG: Recent Labs  Lab 03/02/17 2031 03/02/17 2305 03/03/17 1741 03/04/17 2137 03/04/17 2332  GLUCAP >600* 572* 384* >600* 596*   Lipid Profile: No results for input(s): CHOL, HDL, LDLCALC, TRIG, CHOLHDL, LDLDIRECT in the last 72 hours. Thyroid Function  Tests: No results for input(s): TSH, T4TOTAL, FREET4, T3FREE, THYROIDAB in the last 72 hours. Anemia Panel: No results for input(s): VITAMINB12, FOLATE, FERRITIN, TIBC, IRON, RETICCTPCT in the last 72 hours. Urine analysis:    Component Value Date/Time   COLORURINE STRAW (A) 03/04/2017 2131   APPEARANCEUR CLEAR 03/04/2017 2131   LABSPEC 1.027 03/04/2017 2131   PHURINE 5.0 03/04/2017 2131   GLUCOSEU >=500 (A) 03/04/2017 2131   HGBUR SMALL (A) 03/04/2017 2131   BILIRUBINUR NEGATIVE 03/04/2017 2131   KETONESUR 20 (A) 03/04/2017 2131   PROTEINUR NEGATIVE 03/04/2017 2131   UROBILINOGEN 1.0 02/07/2011 1605   NITRITE NEGATIVE 03/04/2017 2131   LEUKOCYTESUR NEGATIVE 03/04/2017 2131   Sepsis Labs: '@LABRCNTIP'$ (procalcitonin:4,lacticidven:4) )No results found for this or any previous visit (from the past 240 hour(s)).   Radiological Exams on Admission: No results found.  EKG: Not performed.    Assessment/Plan  1. DKA; insulin-dependent DM   - Presents with general malaise and marked hyperglycemia   - Found to have serum glucose 532 with metabolic acidosis and urine ketones  - Suspected secondary  to non-compliance  - Treated in ED with 2 liters NS and started on insulin infusion  - Continue IVF, continue insulin infusion with frequent CBG's and serial chem panels   2. Hypertension; hypertensive urgency  - BP 200/90 in ED  - Hold chlorthalidone and lisinopril initially in light of AKI  - Use hydralazine IVP's for now and resume home antihypertensives when renal fxn stabilizes    3. Normocytic anemia  - Hgb is stable at 10.8 with no bleeding evident  - Prescribed iron-supplements but not taking   4. AKI  - SCr is 1.77 on admission, up from 1.39 two days earlier and from a baseline of 1  - Likely prerenal in setting of DKA  - Anticipate resolution with fluid-resuscitation and insulin  - Following serial chem panels     DVT prophylaxis: sq heparin  Code Status: Full  Family Communication: Discussed with patient Disposition Plan: Admit to SDU Consults called: None Admission status: Inpatient   Vianne Bulls, MD [facility] Pager [phone]  If 7PM-7AM, please contact night-coverage [email or web address] Password [facility]  03/05/2017, 12:03 AM

## 2017-03-05 NOTE — ED Notes (Signed)
Patient denies pain and is resting comfortably.  

## 2017-03-06 LAB — GLUCOSE, CAPILLARY
GLUCOSE-CAPILLARY: 111 mg/dL — AB (ref 65–99)
GLUCOSE-CAPILLARY: 209 mg/dL — AB (ref 65–99)
GLUCOSE-CAPILLARY: 169 mg/dL — AB (ref 65–99)
GLUCOSE-CAPILLARY: 274 mg/dL — AB (ref 65–99)
Glucose-Capillary: 194 mg/dL — ABNORMAL HIGH (ref 65–99)
Glucose-Capillary: 132 mg/dL — ABNORMAL HIGH (ref 65–99)

## 2017-03-06 LAB — CBC WITH DIFFERENTIAL/PLATELET
BASOS PCT: 0 %
Band Neutrophils: 0 %
Basophils Absolute: 0 10*3/uL (ref 0.0–0.1)
Blasts: 0 %
EOS PCT: 2 %
Eosinophils Absolute: 0.2 10*3/uL (ref 0.0–0.7)
HCT: 30.9 % — ABNORMAL LOW (ref 39.0–52.0)
Hemoglobin: 9.8 g/dL — ABNORMAL LOW (ref 13.0–17.0)
LYMPHS ABS: 3.6 10*3/uL (ref 0.7–4.0)
LYMPHS PCT: 47 %
MCH: 25 pg — AB (ref 26.0–34.0)
MCHC: 31.7 g/dL (ref 30.0–36.0)
MCV: 78.8 fL (ref 78.0–100.0)
MONO ABS: 0.4 10*3/uL (ref 0.1–1.0)
MONOS PCT: 5 %
MYELOCYTES: 0 %
Metamyelocytes Relative: 0 %
NEUTROS ABS: 3.6 10*3/uL (ref 1.7–7.7)
NEUTROS PCT: 46 %
NRBC: 0 /100{WBCs}
OTHER: 0 %
PLATELETS: 168 10*3/uL (ref 150–400)
Promyelocytes Absolute: 0 %
RBC: 3.92 MIL/uL — AB (ref 4.22–5.81)
RDW: 14.3 % (ref 11.5–15.5)
WBC: 7.8 10*3/uL (ref 4.0–10.5)

## 2017-03-06 LAB — BASIC METABOLIC PANEL
Anion gap: 10 (ref 5–15)
BUN: 22 mg/dL — AB (ref 6–20)
CHLORIDE: 112 mmol/L — AB (ref 101–111)
CO2: 23 mmol/L (ref 22–32)
CREATININE: 0.93 mg/dL (ref 0.61–1.24)
Calcium: 8.6 mg/dL — ABNORMAL LOW (ref 8.9–10.3)
GFR calc non Af Amer: >60 mL/min (ref >60)
Glucose, Bld: 140 mg/dL — ABNORMAL HIGH (ref 65–99)
Potassium: 3.2 mmol/L — ABNORMAL LOW (ref 3.5–5.1)
SODIUM: 145 mmol/L (ref 135–145)

## 2017-03-06 MED ORDER — INSULIN ASPART 100 UNIT/ML ~~LOC~~ SOLN
0.0000 [IU] | SUBCUTANEOUS | Status: DC
  Administered 2017-03-06: 3 [IU] via SUBCUTANEOUS
  Administered 2017-03-06: 8 [IU] via SUBCUTANEOUS
  Administered 2017-03-07: 5 [IU] via SUBCUTANEOUS
  Administered 2017-03-07: 11 [IU] via SUBCUTANEOUS
  Administered 2017-03-07: 2 [IU] via SUBCUTANEOUS

## 2017-03-06 MED ORDER — GLUCERNA SHAKE PO LIQD
237.0000 mL | Freq: Three times a day (TID) | ORAL | Status: DC
  Administered 2017-03-06 – 2017-03-07 (×2): 237 mL via ORAL

## 2017-03-06 MED ORDER — METOPROLOL TARTRATE 12.5 MG HALF TABLET
12.5000 mg | ORAL_TABLET | Freq: Two times a day (BID) | ORAL | Status: DC
  Administered 2017-03-06 – 2017-03-07 (×3): 12.5 mg via ORAL
  Filled 2017-03-06 (×3): qty 1

## 2017-03-06 MED ORDER — ASPIRIN 325 MG PO TABS
325.0000 mg | ORAL_TABLET | Freq: Every day | ORAL | Status: DC
  Administered 2017-03-06 – 2017-03-07 (×2): 325 mg via ORAL
  Filled 2017-03-06 (×2): qty 1

## 2017-03-06 NOTE — Progress Notes (Signed)
Inpatient Diabetes Program Recommendations  AACE/ADA: New Consensus Statement on Inpatient Glycemic Control (2015)  Target Ranges:  Prepandial:   less than 140 mg/dL      Peak postprandial:   less than 180 mg/dL (1-2 hours)      Critically ill patients:  140 - 180 mg/dL   Lab Results  Component Value Date   GLUCAP 209 (H) 03/06/2017   HGBA1C 10.9 (H) 02/07/2017    Review of Glycemic Control Results for Mike [name], Mike [name] (MRN [id number]) as of 03/06/2017 09:57  Ref. Range 03/05/2017 20:01 03/05/2017 23:52 03/06/2017 03:32 03/06/2017 04:08 03/06/2017 07:29  Glucose-Capillary Latest Ref Range: 65 - 99 mg/dL 643206 (H) 329228 (H) 518111 (H)  209 (H)    Diabetes history:DM 2 Outpatient Diabetes medications:70/30 30 units BID Current orders for Inpatient glycemic control: Novolog Moderate 0-15 units Q4hours, Novolog 70/30 30 Units BID   Inpatient Diabetes Program Recommendations:    Noted carb modified diet, thus Novolog 0-15 Units Q4H is too frequent and may cause lows. Spoke with nurse and verified that patient is eating at 100% and is consuming the protein supplement. Please decrease correction to Novolog 0-9 Units TIDAC.   Thanks, Lujean RaveLauren Miette Molenda, MSN, RNC-OB Diabetes Coordinator [phone] (8a-5p)

## 2017-03-06 NOTE — Progress Notes (Addendum)
Initial Nutrition Assessment  DOCUMENTATION CODES:   Non-severe (moderate) malnutrition in context of chronic illness  INTERVENTION:    Glucerna Shake po TID, each supplement provides 220 kcal and 10 grams of protein  NUTRITION DIAGNOSIS:   Moderate Malnutrition related to social / environmental circumstances(homelessness) as evidenced by moderate fat depletion, moderate muscle depletion  GOAL:   Patient will meet greater than or equal to 90% of their needs  MONITOR:   PO intake, Supplement acceptance, Labs, Skin, Weight trends, I & O's  REASON FOR ASSESSMENT:   Malnutrition Screening Tool  ASSESSMENT:   57 y.o. Male with PMH for insulin-dependent diabetes mellitus, hypertension, chronic anemia, and homelessness, presenting from a shelter for evaluation of general malaise and CBG reading "high."   Pt known to this RD during previous hospital admission. Admitted from a homeless shelter. PO intake was 100% during last acute stay. He has a prescription and current orders for Premier Protein supplements.  Pt would however benefit from higher calorie supplement (ie Glucerna Shake). Medications include Prosight MVI and Novolog Mix 70/30. Labs reviewed. K 3.2 (L). CBG's 209-169-274.  NUTRITION - FOCUSED PHYSICAL EXAM:    Most Recent Value  Orbital Region  No depletion  Upper Arm Region  Mild depletion  Thoracic and Lumbar Region  Mild depletion  Buccal Region  Moderate depletion  Temple Region  Moderate depletion  Clavicle Bone Region  Moderate depletion  Clavicle and Acromion Bone Region  Moderate depletion  Scapular Bone Region  Mild depletion  Dorsal Hand  Moderate depletion  Patellar Region  No depletion  Anterior Thigh Region  No depletion  Posterior Calf Region  Mild depletion  Edema (RD Assessment)  None     Diet Order:  Diet Carb Modified Fluid consistency: Thin; Room service appropriate? Yes  EDUCATION NEEDS:   No education needs have been identified at  this time  Skin:  Skin Assessment: Reviewed RN Assessment  Last BM:  2/19   Intake/Output Summary (Last 24 hours) at 03/06/2017 1641 Last data filed at 03/06/2017 1600 Gross per 24 hour  Intake 3490 ml  Output 1450 ml  Net 2040 ml   Height:   Ht Readings from Last 1 Encounters:  03/04/17 6\' 4"  (1.93 m)   Weight:   Wt Readings from Last 1 Encounters:  03/04/17 170 lb (77.1 kg)   Ideal Body Weight:  91.8 kg  BMI:  Body mass index is 20.69 kg/m.  Estimated Nutritional Needs:   Kcal:  2100-2300  Protein:  90-105 gm  Fluid:  2.1-2.3 L  Maureen ChattersKatie Aidenjames Heckmann, RD, LDN Pager #: [phone] After-Hours Pager #: [phone]

## 2017-03-06 NOTE — Care Management Note (Signed)
Case Management Note  Patient Details  Name: Tobin ChadDesbourns Tebbetts MRN: [id number] Date of Birth: Apr 19, 1960  Subjective/Objective:  Patient is homeless, [facility]  And [name] for help with medications and for follow up apts.  He will need transport at discharge, will notify CSW.  He presents with DKA, HTN urgency, normocytic anemia, AKI.                   Action/Plan: NCM will follow for transition of care needs.   Expected Discharge Date:                  Expected Discharge Plan:  Homeless Shelter  In-House Referral:  Clinical Social Work  Discharge planning Services  CM Consult, Medication Assistance  Post Acute Care Choice:    Choice offered to:     DME Arranged:    DME Agency:     HH Arranged:    HH Agency:     Status of Service:  In process, will continue to follow  If discussed at Long Length of Stay Meetings, dates discussed:    Additional Comments:  [name], Jadis Pitter Clinton, RN 03/06/2017, 2:48 PM

## 2017-03-06 NOTE — Plan of Care (Signed)
Off gtt, cbg hovering 200's

## 2017-03-06 NOTE — Progress Notes (Signed)
PROGRESS NOTE    Mike [name]  JYN:[id number] DOB: 1960/06/18 DOA: 03/04/2017 PCP: [name], [name], NP   Brief Beninarrative56 y.o. male with medical history significant for insulin-dependent diabetes mellitus, hypertension, chronic anemia, and homelessness, presenting from a shelter for evaluation of general malaise and CBG reading "high."  Patient had been seen in the emergency department yesterday for elevated glucose without DKA, was hydrated and discharged back to his shelter in stable and improved condition.  Since that time, he has developed a general malaise and his CBG read "high."  He denies fevers, chills, chest pain, headache, change in vision or hearing, or focal numbness or weakness.  Denies shortness of breath or cough.  Denies dysuria or flank pain.  No abdominal pain.  He is unable to say how much insulin he uses.   ED Course: Upon arrival to the ED, patient is found to be afebrile, saturating well on room air, hypertensive to 200/90, and vitals otherwise stable.  Chemistry panel reveals a serum glucose of 760 with bicarbonate of 16, anion gap 21, and creatinine 1.77, up from 1.39 two days ago and from a baseline of 1.  CBC features a stable normocytic anemia with hemoglobin 10.8. Urinalysis is notable for glucosuria and ketonuria.  He was treated with 2 L normal saline and started on insulin but he stable, in no apparent respiratory distress, and will be admitted to the stepdown unit for ongoing evaluation and management of DKA, suspected secondary to nonadherence with his insulin.   Assessment & Plan:   Principal Problem:   DKA (diabetic ketoacidoses) (HCC) Active Problems:   Hypertensive urgency   DM (diabetes mellitus) (HCC)   Essential hypertension   Normocytic anemia   History of stroke   AKI (acute kidney injury) (HCC) 1. DKA; insulin-dependent DM   - Presents with general malaise and marked hyperglycemia   - Found to have serum glucose 760 with metabolic acidosis  and urine ketones  - secondary to non-compliance  - Treated in ED with 2 liters NS and started on insulin infusion  - DC IV fluids.  His home dose of insulin has been restarted yesterday.  He is tolerating diet.  2. Hypertension; hypertensive urgency  - BP 200/90 in ED .  Continue lisinopril.  Metoprolol has been added.  Patient was taking hydrochlorothiazide at home which has been DC'd at this time due to dehydration and DKA. 3. Normocytic anemia  - Hgb is stable at 10.8 with no bleeding evident  - Prescribed iron-supplements but not taking   4. AKI  - SCr is 1.77 on admission, down to 0.93 today.  Improved with hydration.  - Likely prerenal in setting of DKA       DVT prophylaxis: Heparin Code Status: Full code Family Communication: He has no family Disposition Plan: Expecting discharge in 24-48 hours back to the shelter. Consultants: None  Procedures: None Antimicrobials: None  Subjective: Denies any nausea vomiting tolerating p.o. intake.  Objective: Vitals:   03/06/17 0300 03/06/17 0600 03/06/17 0700 03/06/17 1134  BP: (!) 148/70 (!) 171/88 (!) 155/80 (!) 154/80  Pulse: 91 72 75   Resp: 18 15 17    Temp: 97.8 F (36.6 C)   98.1 F (36.7 C)  TempSrc: Oral   Oral  SpO2: 98% 100% 99%   Weight:      Height:        Intake/Output Summary (Last 24 hours) at 03/06/2017 1256 Last data filed at 03/06/2017 0802 Gross per 24 hour  Intake  3490 ml  Output 1200 ml  Net 2290 ml   Filed Weights   03/04/17 2130  Weight: 77.1 kg (170 lb)    Examination:  General exam: Appears calm and comfortable  Respiratory system: Clear to auscultation. Respiratory effort normal. Cardiovascular system: S1 & S2 heard, RRR. No JVD, murmurs, rubs, gallops or clicks. No pedal edema. Gastrointestinal system: Abdomen is nondistended, soft and nontender. No organomegaly or masses felt. Normal bowel sounds heard. Central nervous system: Alert and oriented. No focal neurological  deficits. Extremities: Symmetric 5 x 5 power. Skin: No rashes, lesions or ulcers Psychiatry: Judgement and insight appear normal. Mood & affect appropriate.     Data Reviewed: I have personally reviewed following labs and imaging studies  CBC: Recent Labs  Lab 03/02/17 2059 03/04/17 2131 03/06/17 0408  WBC 5.3 7.2 7.8  NEUTROABS 3.4  --  3.6  HGB 10.4* 10.8* 9.8*  HCT 33.3* 35.9* 30.9*  MCV 80.0 81.8 78.8  PLT 247 236 168   Basic Metabolic Panel: Recent Labs  Lab 03/05/17 0326 03/05/17 0542 03/05/17 1030 03/05/17 1354 03/06/17 0408  NA 154* 154* 155* 146* 145  K 3.5 3.9 4.0 3.8 3.2*  CL 120* 121* 122* 115* 112*  CO2 22 23 25  21* 23  GLUCOSE 291* 176* 116* 254* 140*  BUN 23* 19 16 16  22*  CREATININE 1.30* 1.12 0.98 0.93 0.93  CALCIUM 9.1 9.1 9.2 8.5* 8.6*   GFR: Estimated Creatinine Clearance: 97.9 mL/min (by C-G formula based on SCr of 0.93 mg/dL). Liver Function Tests: No results for input(s): AST, ALT, ALKPHOS, BILITOT, PROT, ALBUMIN in the last 168 hours. No results for input(s): LIPASE, AMYLASE in the last 168 hours. No results for input(s): AMMONIA in the last 168 hours. Coagulation Profile: No results for input(s): INR, PROTIME in the last 168 hours. Cardiac Enzymes: No results for input(s): CKTOTAL, CKMB, CKMBINDEX, TROPONINI in the last 168 hours. BNP (last 3 results) No results for input(s): PROBNP in the last 8760 hours. HbA1C: No results for input(s): HGBA1C in the last 72 hours. CBG: Recent Labs  Lab 03/05/17 2001 03/05/17 2352 03/06/17 0332 03/06/17 0729 03/06/17 1133  GLUCAP 206* 228* 111* 209* 169*   Lipid Profile: No results for input(s): CHOL, HDL, LDLCALC, TRIG, CHOLHDL, LDLDIRECT in the last 72 hours. Thyroid Function Tests: No results for input(s): TSH, T4TOTAL, FREET4, T3FREE, THYROIDAB in the last 72 hours. Anemia Panel: No results for input(s): VITAMINB12, FOLATE, FERRITIN, TIBC, IRON, RETICCTPCT in the last 72 hours. Sepsis  Labs: No results for input(s): PROCALCITON, LATICACIDVEN in the last 168 hours.  Recent Results (from the past 240 hour(s))  MRSA PCR Screening     Status: None   Collection Time: 03/05/17  3:57 PM  Result Value Ref Range Status   MRSA by PCR NEGATIVE NEGATIVE Final    Comment:        The GeneXpert MRSA Assay (FDA approved for NASAL specimens only), is one component of a comprehensive MRSA colonization surveillance program. It is not intended to diagnose MRSA infection nor to guide or monitor treatment for MRSA infections. Performed at [facility], [address] [place], [place] [address]          Radiology Studies: No results found.      Scheduled Meds: . aspirin  325 mg Oral Daily  . atorvastatin  40 mg Oral q1800  . heparin  5,000 Units Subcutaneous Q8H  . insulin aspart  0-9 Units Subcutaneous Q4H  . insulin  aspart protamine- aspart  30 Units Subcutaneous BID WC  . lisinopril  40 mg Oral Daily  . multivitamin  1 tablet Oral Daily  . protein supplement shake  11 oz Oral BID BM   Continuous Infusions:   LOS: 1 day     Alwyn Ren, MD [facility]  If 7PM-7AM, please contact night-coverage [email or web address] Password [facility] 03/06/2017, 12:56 PM

## 2017-03-07 DIAGNOSIS — E1110 Type 2 diabetes mellitus with ketoacidosis without coma: Principal | ICD-10-CM

## 2017-03-07 LAB — BASIC METABOLIC PANEL
Anion gap: 9 (ref 5–15)
BUN: 24 mg/dL — AB (ref 6–20)
CO2: 24 mmol/L (ref 22–32)
Calcium: 8.5 mg/dL — ABNORMAL LOW (ref 8.9–10.3)
Chloride: 105 mmol/L (ref 101–111)
Creatinine, Ser: 0.84 mg/dL (ref 0.61–1.24)
GFR calc Af Amer: >60 mL/min (ref >60)
GFR calc non Af Amer: >60 mL/min (ref >60)
GLUCOSE: 354 mg/dL — AB (ref 65–99)
POTASSIUM: 3.4 mmol/L — AB (ref 3.5–5.1)
Sodium: 138 mmol/L (ref 135–145)

## 2017-03-07 LAB — GLUCOSE, CAPILLARY
GLUCOSE-CAPILLARY: 387 mg/dL — AB (ref 65–99)
Glucose-Capillary: 327 mg/dL — ABNORMAL HIGH (ref 65–99)
Glucose-Capillary: 244 mg/dL — ABNORMAL HIGH (ref 65–99)

## 2017-03-07 LAB — MAGNESIUM: Magnesium: 1.8 mg/dL (ref 1.7–2.4)

## 2017-03-07 MED ORDER — POTASSIUM CHLORIDE CRYS ER 20 MEQ PO TBCR
40.0000 meq | EXTENDED_RELEASE_TABLET | Freq: Once | ORAL | Status: AC
  Administered 2017-03-07: 40 meq via ORAL
  Filled 2017-03-07: qty 2

## 2017-03-07 MED ORDER — MAGNESIUM SULFATE 4 GM/100ML IV SOLN
4.0000 g | Freq: Once | INTRAVENOUS | Status: AC
  Administered 2017-03-07: 4 g via INTRAVENOUS
  Filled 2017-03-07: qty 100

## 2017-03-07 MED ORDER — METOPROLOL TARTRATE 25 MG PO TABS: 12.5000 mg | ORAL_TABLET | Freq: Two times a day (BID) | ORAL | 0 refills | Status: DC

## 2017-03-07 MED ORDER — INSULIN ASPART 100 UNIT/ML ~~LOC~~ SOLN
0.0000 [IU] | SUBCUTANEOUS | Status: DC
  Administered 2017-03-07: 15 [IU] via SUBCUTANEOUS

## 2017-03-07 MED ORDER — ASPIRIN 325 MG PO TABS: 325.0000 mg | ORAL_TABLET | Freq: Every day | ORAL | Status: DC

## 2017-03-07 NOTE — Plan of Care (Signed)
Patient shows no evidence of learning.

## 2017-03-07 NOTE — Progress Notes (Signed)
Inpatient Diabetes Program Recommendations  AACE/ADA: New Consensus Statement on Inpatient Glycemic Control (2015)  Target Ranges:  Prepandial:   less than 140 mg/dL      Peak postprandial:   less than 180 mg/dL (1-2 hours)      Critically ill patients:  140 - 180 mg/dL   Lab Results  Component Value Date   GLUCAP 387 (H) 03/07/2017   HGBA1C 10.9 (H) 02/07/2017    Review of Glycemic Control Results for Mike [name], Mike [name] (MRN [id number]) as of 03/07/2017 14:14  Ref. Range 03/06/2017 21:18 03/06/2017 23:22 03/07/2017 04:12 03/07/2017 07:35 03/07/2017 11:59  Glucose-Capillary Latest Ref Range: 65 - 99 mg/dL 409194 (H) 811132 (H) 914327 (H) 244 (H) 387 (H)   Diabetes history: DM2 Outpatient Diabetes medications:70/30 30 units BID Current orders for Inpatient glycemic control:Novolog Moderate 0-15 units Q4hours, Novolog 70/30 30 Units BID  Inpatient Diabetes Program Recommendations:    Patient has received 44 units Novolog over the past 24 hrs. -Increase Novolog 70/30 insulin to 35 bid -Change Novolog correction to tid + 0-5 units hs  Thank you, Billy FischerJudy E. Sheresa Cullop, RN, MSN, CDE  Diabetes Coordinator Inpatient Glycemic Control Team Team Pager [phone] (8am-5pm) 03/07/2017 2:15 PM

## 2017-03-07 NOTE — Care Management Note (Addendum)
Case Management Note  Patient Details  Name: Tobin ChadDesbourns Marinez MRN: [id number] Date of Birth: [birth year]  Subjective/Objective:  Presents with DKA, [facility] is homeless[facility]  And [name] for help with medications and for follow up apts.  He will need transport at discharge, CSW put cab voucher on chart. [facility] , [facility].                    Action/Plan: DC today[facility]  Expected Discharge Date:  03/07/17               Expected Discharge Plan:  Homeless Shelter  In-House Referral:  Clinical Social Work  Discharge planning Services  CM Consult, Medication Assistance  Post Acute Care Choice:    Choice offered to:     DME Arranged:    DME Agency:     HH Arranged:    HH Agency:     Status of Service:  Completed, signed off  If discussed at [employer] Length of [employer] Meetings, dates discussed:    Additional Comments:  [name], Tyshae Stair Clinton, RN 03/07/2017, 11:45 AM

## 2017-03-07 NOTE — Discharge Summary (Signed)
Physician Discharge Summary  Mike [name] [id number]:[id number] DOB: 1960/03/09 DOA: 03/04/2017  PCP: [name], NP  Admit date: 03/04/2017 Discharge date: 03/07/2017  Admitted From:shelter Disposition:  shelter Recommendations for Outpatient Follow-up:  1. Follow up with PCP in 1-2 weeks 2. Please obtain BMP/CBC in one week Home Health:none Equipment/Devicesnone Discharge Condition:stable CODE STATUS:full Diet recommendation:modified carb  Brief/Interim Summary:57 y.o.malewith medical history significant forinsulin-dependent diabetes mellitus, hypertension, chronic anemia, and homelessness, presenting from a shelter for evaluation of general malaise and CBG reading "high." Patient had been seen in the emergency department yesterday for elevated glucose without DKA, was hydrated and discharged back to his shelter in stable and improved condition. Since that time, he has developed a general malaise and his CBG read "high." He denies fevers, chills, chest pain, headache, change in vision or hearing, or focal numbness or weakness. Denies shortness of breath or cough. Denies dysuria or flank pain. No abdominal pain. He is unable to say how much insulin he uses.  ED Course:Upon arrival to the ED, patient is found to be afebrile, saturating well on room air, hypertensive to 200/90, and vitals otherwise stable. Chemistry panel reveals a serum glucose of 760 with bicarbonate of 16, anion gap 21, and creatinine 1.77, up from 1.39 twodays ago and from a baseline of 1. CBC features a stable normocytic anemia with hemoglobin 10.8. Urinalysis is notable for glucosuria and ketonuria. He was treated with 2 L normal saline and started on insulin but he stable, in no apparent respiratory distress, and will be admitted to the stepdown unit for ongoing evaluation and management of DKA, suspected secondary to nonadherence with his insulin.    Discharge Diagnoses:  Principal Problem:   DKA  (diabetic ketoacidoses) ([place]) Active Problems:   Hypertensive urgency   DM (diabetes mellitus) ([place])   Essential hypertension   Normocytic anemia   History of stroke   AKI (acute kidney injury) ([place])  1.DKA; insulin-dependent DM -Presented with general malaise and marked hyperglycemia -Found to have serum glucose 017 with metabolic acidosis and urine ketones - secondary to non-compliance -Treated in ED with 2 liters NS and started on insulin infusion -DC IV fluids.  His home dose of insulin has been restarted.  He is tolerating diet. FOLLOW UP AT [place]. 2.Hypertension; hypertensive urgency -BP 200/90 in ED.  Continue lisinopril.  Metoprolol has been added.  Patient was taking hydrochlorothiazide at home which has been DC'd at this time due to dehydration and DKA. 3.Normocytic anemia -Hgb is stable at 10.8 with no bleeding evident -Prescribed iron-supplements but not taking  4.AKI -SCr is 1.77 on admission, down to 0.93 today.  Improved with hydration.  -Likely prerenal in setting of DKA    Discharge Instructions  Discharge Instructions    Call MD for:  persistant dizziness or light-headedness   Complete by:  As directed    Call MD for:  persistant nausea and vomiting   Complete by:  As directed    Call MD for:  severe uncontrolled pain   Complete by:  As directed    Diet - low sodium heart healthy   Complete by:  As directed    Increase activity slowly   Complete by:  As directed      Allergies as of 03/07/2017      Reactions   Ibuprofen Nausea And Vomiting, Other (See Comments)   Makes the patient feel bloated also   Tylenol [acetaminophen] Nausea And Vomiting, Other (See Comments)   Makes the  patient feel bloated also      Medication List    STOP taking these medications   chlorthalidone 100 MG tablet Commonly known as:  HYGROTEN     TAKE these medications   aspirin 325 MG tablet Take 1 tablet (325 mg  total) by mouth daily. Start taking on:  03/08/2017   atorvastatin 40 MG tablet Commonly known as:  LIPITOR Take 1 tablet (40 mg total) by mouth daily.   blood glucose meter kit and supplies Kit Dispense based on patient and insurance preference. Use up to four times daily as directed. (FOR ICD-9 250.00, 250.01).   ferrous sulfate 325 (65 FE) MG tablet Take 1 tablet (325 mg total) daily by mouth. What changed:    when to take this  reasons to take this   insulin aspart protamine- aspart (70-30) 100 UNIT/ML injection Commonly known as:  NOVOLOG MIX 70/30 Inject 0.3 mLs (30 Units total) into the skin 2 (two) times daily with a meal.   lisinopril 40 MG tablet Commonly known as:  PRINIVIL,ZESTRIL Take 1 tablet (40 mg total) by mouth daily.   metoprolol tartrate 25 MG tablet Commonly known as:  LOPRESSOR Take 0.5 tablets (12.5 mg total) by mouth 2 (two) times daily.   multivitamin Tabs tablet Take 1 tablet by mouth daily.   protein supplement shake Liqd Commonly known as:  PREMIER PROTEIN Take 325 mLs (11 oz total) by mouth 2 (two) times daily between meals.      Follow-up [place] Follow up.   Contact information: [place] [phone]         Allergies  Allergen Reactions  . Ibuprofen Nausea And Vomiting and Other (See Comments)    Makes the patient feel bloated also  . Tylenol [Acetaminophen] Nausea And Vomiting and Other (See Comments)    Makes the patient feel bloated also    Consultations:   Procedures/Studies: Dg Chest 2 View  Result Date: 02/11/2017 CLINICAL DATA:  Shortness of Breath EXAM: CHEST  2 VIEW COMPARISON:  11/17/2016 FINDINGS: Cardiac shadow is stable. The lungs are well aerated bilaterally. No focal infiltrate or sizable effusion is seen. Extrinsic artifact is noted over the left lung base. Bilateral nipple shadows are noted. IMPRESSION: No acute  abnormality noted. Electronically Signed   By: Inez Catalina M.D.   On: 02/11/2017 10:04    (Echo, Carotid, EGD, Colonoscopy, ERCP)    Subjective:   Discharge Exam: Vitals:   03/07/17 0450 03/07/17 0739  BP: (!) 151/71 (!) 168/79  Pulse:  60  Resp:  16  Temp:  98.5 F (36.9 C)  SpO2:  100%   Vitals:   03/06/17 2324 03/07/17 0350 03/07/17 0450 03/07/17 0739  BP: (!) 141/82 (!) 165/73 (!) 151/71 (!) 168/79  Pulse: 97   60  Resp: 11 (!) 35  16  Temp: 98.7 F (37.1 C) 98.5 F (36.9 C)  98.5 F (36.9 C)  TempSrc: Oral Oral  Oral  SpO2: 100% 100%  100%  Weight:      Height:        General: Pt is alert, awake, not in acute distress Cardiovascular: RRR, S1/S2 +, no rubs, no gallops Respiratory: CTA bilaterally, no wheezing, no rhonchi Abdominal: Soft, NT, ND, bowel sounds + Extremities: no edema, no cyanosis    The results of significant diagnostics from this hospitalization (including imaging, microbiology, ancillary and laboratory) are listed below for reference.  Microbiology: Recent Results (from the past 240 hour(s))  MRSA PCR Screening     Status: None   Collection Time: 03/05/17  3:57 PM  Result Value Ref Range Status   MRSA by PCR NEGATIVE NEGATIVE Final    Comment:        The GeneXpert MRSA Assay (FDA approved for NASAL specimens only), is one component of a comprehensive MRSA colonization surveillance program. It is not intended to diagnose MRSA infection nor to guide or monitor treatment for MRSA infections. Performed at [facility], [place] [address], [place], [place] [address]      Labs: BNP (last 3 results) No results for input(s): BNP in the last 8760 hours. Basic Metabolic Panel: Recent Labs  Lab 03/05/17 0542 03/05/17 1030 03/05/17 1354 03/06/17 0408 03/07/17 0355  NA 154* 155* 146* 145 138  K 3.9 4.0 3.8 3.2* 3.4*  CL 121* 122* 115* 112* 105  CO2 23 25 21* 23 24  GLUCOSE 176* 116* 254* 140* 354*  BUN _0 22* 24*   CREATININE 1.12 0.98 0.93 0.93 0.84  CALCIUM 9.1 9.2 8.5* 8.6* 8.5*  MG  --   --   --   --  1.8   Liver Function Tests: No results for input(s): AST, ALT, ALKPHOS, BILITOT, PROT, ALBUMIN in the last 168 hours. No results for input(s): LIPASE, AMYLASE in the last 168 hours. No results for input(s): AMMONIA in the last 168 hours. CBC: Recent Labs  Lab 03/02/17 2059 03/04/17 2131 03/06/17 0408  WBC 5.3 7.2 7.8  NEUTROABS 3.4  --  3.6  HGB 10.4* 10.8* 9.8*  HCT 33.3* 35.9* 30.9*  MCV 80.0 81.8 78.8  PLT 247 236 168   Cardiac Enzymes: No results for input(s): CKTOTAL, CKMB, CKMBINDEX, TROPONINI in the last 168 hours. BNP: Invalid input(s): POCBNP CBG: Recent Labs  Lab 03/06/17 1640 03/06/17 2118 03/06/17 2322 03/07/17 0412 03/07/17 0735  GLUCAP 274* 194* 132* 327* 244*   D-Dimer No results for input(s): DDIMER in the last 72 hours. Hgb A1c No results for input(s): HGBA1C in the last 72 hours. Lipid Profile No results for input(s): CHOL, HDL, LDLCALC, TRIG, CHOLHDL, LDLDIRECT in the last 72 hours. Thyroid function studies No results for input(s): TSH, T4TOTAL, T3FREE, THYROIDAB in the last 72 hours.  Invalid input(s): FREET3 Anemia work up No results for input(s): VITAMINB12, FOLATE, FERRITIN, TIBC, IRON, RETICCTPCT in the last 72 hours. Urinalysis    Component Value Date/Time   COLORURINE STRAW (A) 03/04/2017 2131   APPEARANCEUR CLEAR 03/04/2017 2131   LABSPEC 1.027 03/04/2017 2131   PHURINE 5.0 03/04/2017 2131   GLUCOSEU >=500 (A) 03/04/2017 2131   HGBUR SMALL (A) 03/04/2017 2131   BILIRUBINUR NEGATIVE 03/04/2017 2131   KETONESUR 20 (A) 03/04/2017 2131   PROTEINUR NEGATIVE 03/04/2017 2131   UROBILINOGEN 1.0 02/07/2011 1605   NITRITE NEGATIVE 03/04/2017 2131   LEUKOCYTESUR NEGATIVE 03/04/2017 2131   Sepsis Labs Invalid input(s): PROCALCITONIN,  WBC,  LACTICIDVEN Microbiology Recent Results (from the past 240 hour(s))  MRSA PCR Screening     Status: None    Collection Time: 03/05/17  3:57 PM  Result Value Ref Range Status   MRSA by PCR NEGATIVE NEGATIVE Final    Comment:        The GeneXpert MRSA Assay (FDA approved for NASAL specimens only), is one component of a comprehensive MRSA colonization surveillance program. It is not intended to diagnose MRSA infection nor to guide or monitor treatment for MRSA infections.  Performed at [facility], [place] [address], [place], [place] [address]      Time coordinating discharge: Over 30 minutes  SIGNED:   Georgette Shell, MD  [facility] 03/07/2017, 9:52 AM Pager   If 7PM-7AM, please contact night-coverage [email or web address] Password [email or web address]

## 2017-03-07 NOTE — Progress Notes (Signed)
CSW consulted for homelessness  CSW spoke with pt who [facility] but states that [facility] changed and that he is now banned from returning to that shelter.  [facility]- ban is at least 2 weeks but could be longer  CSW spoke with Mee HivesMary [name], [facility] she would like patient to come by today at discharge (before 3pm) to check in.  Ms. Hilbert Corriganlacey also states that tonight is a white flag night so [facility]  CSW informed patient who is agreeable to this plan  CSW provided taxi voucher to RN and informed pt needs to leave in [facility]  CSW signing off  Burna SisJenna H. Debbe Crumble, [name] Clinical Social Worker [phone]

## 2017-03-12 ENCOUNTER — Other Ambulatory Visit: Payer: Self-pay

## 2017-03-12 ENCOUNTER — Encounter (HOSPITAL_COMMUNITY): Payer: Self-pay | Admitting: Emergency Medicine

## 2017-03-12 ENCOUNTER — Emergency Department (HOSPITAL_COMMUNITY)
Admission: EM | Admit: 2017-03-12 | Discharge: 2017-03-13 | Disposition: A | Discharge status: Home / Self Care | Payer: Self-pay | Attending: Emergency Medicine | Admitting: Emergency Medicine

## 2017-03-12 DIAGNOSIS — E1165 Type 2 diabetes mellitus with hyperglycemia: Secondary | ICD-10-CM | POA: Insufficient documentation

## 2017-03-12 DIAGNOSIS — Z79899 Other long term (current) drug therapy: Secondary | ICD-10-CM | POA: Insufficient documentation

## 2017-03-12 DIAGNOSIS — I10 Essential (primary) hypertension: Secondary | ICD-10-CM | POA: Insufficient documentation

## 2017-03-12 DIAGNOSIS — Z794 Long term (current) use of insulin: Secondary | ICD-10-CM | POA: Insufficient documentation

## 2017-03-12 DIAGNOSIS — R739 Hyperglycemia, unspecified: Secondary | ICD-10-CM

## 2017-03-12 DIAGNOSIS — F17210 Nicotine dependence, cigarettes, uncomplicated: Secondary | ICD-10-CM | POA: Insufficient documentation

## 2017-03-12 LAB — BASIC METABOLIC PANEL
Anion gap: 19 — ABNORMAL HIGH (ref 5–15)
BUN: 29 mg/dL — ABNORMAL HIGH (ref 6–20)
CO2: 25 mmol/L (ref 22–32)
Calcium: 9.8 mg/dL (ref 8.9–10.3)
Chloride: 99 mmol/L — ABNORMAL LOW (ref 101–111)
Creatinine, Ser: 1.51 mg/dL — ABNORMAL HIGH (ref 0.61–1.24)
GFR calc Af Amer: 58 mL/min — ABNORMAL LOW (ref >60)
GFR calc non Af Amer: 50 mL/min — ABNORMAL LOW (ref >60)
Glucose, Bld: 657 mg/dL (ref 65–99)
Potassium: 5.3 mmol/L — ABNORMAL HIGH (ref 3.5–5.1)
Sodium: 143 mmol/L (ref 135–145)

## 2017-03-12 LAB — CBC
HCT: 32.9 % — ABNORMAL LOW (ref 39.0–52.0)
Hemoglobin: 9.9 g/dL — ABNORMAL LOW (ref 13.0–17.0)
MCH: 25.1 pg — ABNORMAL LOW (ref 26.0–34.0)
MCHC: 30.1 g/dL (ref 30.0–36.0)
MCV: 83.5 fL (ref 78.0–100.0)
Platelets: 382 10*3/uL (ref 150–400)
RBC: 3.94 MIL/uL — ABNORMAL LOW (ref 4.22–5.81)
RDW: 15.5 % (ref 11.5–15.5)
WBC: 6.4 10*3/uL (ref 4.0–10.5)

## 2017-03-12 LAB — URINALYSIS, ROUTINE W REFLEX MICROSCOPIC
Bacteria, UA: NONE SEEN
Bilirubin Urine: NEGATIVE
Glucose, UA: >=500 mg/dL — AB
Hgb urine dipstick: NEGATIVE
Ketones, ur: 20 mg/dL — AB
Leukocytes, UA: NEGATIVE
Nitrite: NEGATIVE
Protein, ur: NEGATIVE mg/dL
Specific Gravity, Urine: 1.026 (ref 1.005–1.030)
Squamous Epithelial / LPF: NONE SEEN
WBC, UA: NONE SEEN WBC/hpf (ref 0–5)
pH: 5 (ref 5.0–8.0)

## 2017-03-12 LAB — CBG MONITORING, ED
GLUCOSE-CAPILLARY: 453 mg/dL — AB (ref 65–99)
Glucose-Capillary: >600 mg/dL (ref 65–99)
Glucose-Capillary: 559 mg/dL (ref 65–99)

## 2017-03-12 MED ORDER — INSULIN ASPART PROT & ASPART (70-30 MIX) 100 UNIT/ML ~~LOC~~ SUSP
30.0000 [IU] | Freq: Once | SUBCUTANEOUS | Status: AC
Start: 2017-03-12 — End: 2017-03-12
  Administered 2017-03-12: 30 [IU] via SUBCUTANEOUS
  Filled 2017-03-12: qty 10

## 2017-03-12 MED ORDER — SODIUM CHLORIDE 0.9 % IV BOLUS (SEPSIS)
1000.0000 mL | Freq: Once | INTRAVENOUS | Status: AC
  Administered 2017-03-12: 1000 mL via INTRAVENOUS

## 2017-03-12 NOTE — ED Provider Notes (Signed)
[facility] EMERGENCY DEPARTMENT Provider Note   CSN: [id number] Arrival date & time: 03/12/17  1854     History   Chief Complaint Chief Complaint  Patient presents with  . Hyperglycemia    HPI Mike [name] is a 57 y.o. male with history of chronic low back pain, type 2 diabetes, HTN, HLD, seizures, homelessness, tobacco abuse and chronic noncompliance presents today for evaluation of high blood sugar.  He states that his sugars have been running high for the past 2 days but is unable to tell me what his blood sugar is and states he has not been taking it.  States that he ran out of his diabetes medications yesterday.  He denies polyuria, polydipsia, headaches, vision changes, chest pain, shortness of breath, abdominal pain, nausea, or vomiting.  He is very difficult to obtain a history from.  Of note, he was recently discharged 5 days ago for hyperglycemia.  The history is provided by the patient.    Past Medical History:  Diagnosis Date  . Anxiety   . Anxiety   . Chronic lower back pain   . Depression   . DKA (diabetic ketoacidoses) ([place]) 07/30/2016  . Hyperlipemia   . Hypertension   . Migraine    "last one was ~ 4 yr ago" (12/23/2014)  . Seizures ([place])    "related to pills for anxiety; if I don't take the pills I'm suppose to take I'll have them" (12/23/2014)  . Type II diabetes mellitus [facility])     Patient Active Problem List   Diagnosis Date Noted  . AKI (acute kidney injury) ([place]) 03/04/2017  . Malnutrition of moderate degree 02/08/2017  . DKA (diabetic ketoacidoses) ([place]) 02/07/2017  . History of stroke 02/07/2017  . Hyperphosphatemia 12/13/2016  . Evaluation by psychiatric service required 11/30/2016  . Lactic acidosis 11/18/2016  . Normocytic anemia 11/06/2016  . HLD (hyperlipidemia) 11/06/2016  . Adjustment disorder with other symptoms 11/05/2016  . Uncontrolled type 2 diabetes mellitus with hyperosmolar nonketotic hyperglycemia ([place])  08/24/2016  . Essential hypertension 08/24/2016  . Acute ischemic stroke ([place])   . Protein-calorie malnutrition, severe 12/24/2014  . Homelessness 12/23/2014  . Hypertensive urgency 12/23/2014  . DM (diabetes mellitus) ([place]) 12/23/2014  . Intermittent palpitations 12/23/2014  . Tobacco abuse 12/23/2014  . Pleuritic chest pain 12/23/2014  . Abnormal EKG 12/23/2014  . Type II diabetes mellitus with renal manifestations [facility])     Past Surgical History:  Procedure Laterality Date  . NO PAST SURGERIES         Home Medications    Prior to Admission medications   Medication Sig Start Date End Date Taking? Authorizing Provider  aspirin 325 MG tablet Take 1 tablet (325 mg total) by mouth daily. 03/08/17  Yes Georgette Shell, MD  atorvastatin (LIPITOR) 40 MG tablet Take 1 tablet (40 mg total) by mouth daily. 01/26/17  Yes Cardama, Grayce Sessions, MD  ferrous sulfate 325 (65 FE) MG tablet Take 1 tablet (325 mg total) daily by mouth. Patient taking differently: Take 325 mg by mouth daily as needed (low iron).  11/23/16  Yes Ward, Cyril Mourning N, DO  insulin aspart protamine- aspart (NOVOLOG MIX 70/30) (70-30) 100 UNIT/ML injection Inject 0.3 mLs (30 Units total) into the skin 2 (two) times daily with a meal. 02/13/17  Yes Charlynne Cousins, MD  lisinopril (PRINIVIL,ZESTRIL) 40 MG tablet Take 1 tablet (40 mg total) by mouth daily. 01/26/17  Yes Cardama, Grayce Sessions, MD  metoprolol tartrate (LOPRESSOR) 25  MG tablet Take 0.5 tablets (12.5 mg total) by mouth 2 (two) times daily. 03/07/17  Yes Georgette Shell, MD  blood glucose meter kit and supplies KIT Dispense based on patient and insurance preference. Use up to four times daily as directed. (FOR ICD-9 250.00, 250.01). 11/22/16   Velvet Bathe, MD  multivitamin (PROSIGHT) TABS tablet Take 1 tablet by mouth daily. Patient not taking: Reported on 01/30/2017 12/22/16   Rai, Vernelle Emerald, MD  protein supplement shake (PREMIER PROTEIN) LIQD Take 325  mLs (11 oz total) by mouth 2 (two) times daily between meals. Patient not taking: Reported on 01/30/2017 12/22/16   Mendel Corning, MD    Family History Family History  Problem Relation Age of Onset  . Diabetes Mellitus II Mother   . Diabetes Mellitus II Father     Social History Social History   Tobacco Use  . Smoking status: Current Every Day Smoker    Packs/day: 0.10    Years: 40.00    Pack years: 4.00    Types: Cigarettes  . Smokeless tobacco: Never Used  Substance Use Topics  . Alcohol use: Yes    Alcohol/week: 1.2 oz    Types: 2 Cans of beer per week  . Drug use: No    Comment: 12/23/2014 "stopped ~ 10 yrs ago"     Allergies   Ibuprofen and Tylenol [acetaminophen]   Review of Systems Review of Systems  Constitutional: Positive for fatigue. Negative for chills and fever.  Respiratory: Negative for shortness of breath.   Cardiovascular: Negative for chest pain.  Gastrointestinal: Negative for abdominal pain, nausea and vomiting.  Endocrine: Negative for polydipsia and polyuria.       +hyperglycemia  Neurological: Negative for syncope and headaches.  All other systems reviewed and are negative.    Physical Exam Updated Vital Signs BP (!) 163/97   Pulse 80   Resp 13   SpO2 100%   Physical Exam  Constitutional: He appears well-developed and well-nourished. No distress.  Clothing is dirty, patient is disheveled in appearance.  Malodorous.  Dirty fingernails.  HENT:  Head: Normocephalic and atraumatic.  Eyes: Conjunctivae are normal. Right eye exhibits no discharge. Left eye exhibits no discharge.  Neck: No JVD present. No tracheal deviation present.  Cardiovascular: Normal rate, regular rhythm, normal heart sounds and intact distal pulses.  Pulmonary/Chest: Effort normal and breath sounds normal.  Equal rise and fall of chest, no increased work of breathing  Abdominal: Soft. Bowel sounds are normal. He exhibits no distension. There is no tenderness. There  is no guarding.  Musculoskeletal: He exhibits no edema.  Neurological: He is alert.  Alert, slow to answer questions but answers appropriately.  Follows commands appropriately.  No facial droop.  Skin: Skin is warm and dry. No erythema.  Psychiatric: He has a normal mood and affect. His behavior is normal.  Nursing note and vitals reviewed.    ED Treatments / Results  Labs (all labs ordered are listed, but only abnormal results are displayed) Labs Reviewed  BASIC METABOLIC PANEL - Abnormal; Notable for the following components:      Result Value   Potassium 5.3 (*)    Chloride 99 (*)    Glucose, Bld 657 (*)    BUN 29 (*)    Creatinine, Ser 1.51 (*)    GFR calc non Af Amer 50 (*)    GFR calc Af Amer 58 (*)    Anion gap 19 (*)    All other components  within normal limits  CBC - Abnormal; Notable for the following components:   RBC 3.94 (*)    Hemoglobin 9.9 (*)    HCT 32.9 (*)    MCH 25.1 (*)    All other components within normal limits  URINALYSIS, ROUTINE W REFLEX MICROSCOPIC - Abnormal; Notable for the following components:   Color, Urine STRAW (*)    Glucose, UA >=500 (*)    Ketones, ur 20 (*)    All other components within normal limits  CBG MONITORING, ED - Abnormal; Notable for the following components:   Glucose-Capillary >600 (*)    All other components within normal limits  CBG MONITORING, ED - Abnormal; Notable for the following components:   Glucose-Capillary 559 (*)    All other components within normal limits  CBG MONITORING, ED - Abnormal; Notable for the following components:   Glucose-Capillary 453 (*)    All other components within normal limits    EKG  EKG Interpretation None       Radiology No results found.  Procedures Procedures (including critical care time)  Medications Ordered in ED Medications  sodium chloride 0.9 % bolus 1,000 mL (0 mLs Intravenous Stopped 03/12/17 2057)  insulin aspart protamine- aspart (NOVOLOG MIX 70/30)  injection 30 Units (30 Units Subcutaneous Given 03/12/17 2140)     Initial Impression / Assessment and Plan / ED Course  I have reviewed the triage vital signs and the nursing notes.  Pertinent labs & imaging results that were available during my care of the patient were reviewed by me and considered in my medical decision making (see chart for details).     Patient presents complaining of hyperglycemia but has not taken his blood sugar outside of the ED.  Afebrile, vital signs are at patient's baseline.  He is non-toxic in appearance and does not appear ill in the least. He is well-known to [place] and comes to the ED frequently for uncontrolled blood sugars.  He is noncompliant with his home insulin. He has a care plan in place which recommends ruling out acute diabetic emergency.  Initial blood sugar greater than 600 with significant improvement after administration of his home dose of insulin and 1 L of fluids.  He was found to be very mildly hyperkalemic with elevated creatinine and anion gap of 19 but I suspect that this has all improved after fluids and insulin as it does on chart review. Most recent CBG 453, in line with his baseline glucose. On reevaluation, he is resting comfortably and has eaten a [place] sandwich and apple sauce.  He is tolerated this without difficulty.  He appears quicker to answer questions.  Discussed with patient that it is imperative that he follow-up at the [facility] where he normally goes for refills of his insulin. Pt verbalized understanding of and agreement with plan and is safe for discharge home at this time.   Final Clinical Impressions(s) / ED Diagnoses   Final diagnoses:  Hyperglycemia    ED Discharge Orders    None       Debroah Baller 03/13/17 0011    Blanchie Dessert, MD 03/13/17 2021

## 2017-03-12 NOTE — ED Triage Notes (Signed)
Per EMS: pt from home with c/o hyperglycemia and general weakness x 3 days.  PTA vitals: BP 220/100, HR 80-90's.

## 2017-03-12 NOTE — ED Notes (Signed)
Pharmacy notified of need for insulin.

## 2017-03-12 NOTE — Discharge Instructions (Signed)
[facility] your insulin.  Continue to drink plenty of water and get plenty of rest.  Follow-up with your primary care physician for reevaluation.  Return to the emergency department if any concerning signs or symptoms develop.

## 2017-03-13 ENCOUNTER — Encounter (HOSPITAL_COMMUNITY): Payer: Self-pay | Admitting: Emergency Medicine

## 2017-03-13 ENCOUNTER — Emergency Department (HOSPITAL_COMMUNITY)
Admission: EM | Admit: 2017-03-13 | Discharge: 2017-03-13 | Disposition: A | Discharge status: Home / Self Care | Payer: Self-pay | Attending: Emergency Medicine | Admitting: Emergency Medicine

## 2017-03-13 ENCOUNTER — Encounter: Payer: Self-pay | Admitting: *Deleted

## 2017-03-13 ENCOUNTER — Encounter (HOSPITAL_COMMUNITY): Payer: Self-pay | Admitting: *Deleted

## 2017-03-13 DIAGNOSIS — E119 Type 2 diabetes mellitus without complications: Secondary | ICD-10-CM | POA: Insufficient documentation

## 2017-03-13 DIAGNOSIS — Z7982 Long term (current) use of aspirin: Secondary | ICD-10-CM | POA: Insufficient documentation

## 2017-03-13 DIAGNOSIS — Z79899 Other long term (current) drug therapy: Secondary | ICD-10-CM | POA: Insufficient documentation

## 2017-03-13 DIAGNOSIS — F17210 Nicotine dependence, cigarettes, uncomplicated: Secondary | ICD-10-CM | POA: Insufficient documentation

## 2017-03-13 DIAGNOSIS — R739 Hyperglycemia, unspecified: Secondary | ICD-10-CM | POA: Insufficient documentation

## 2017-03-13 DIAGNOSIS — I10 Essential (primary) hypertension: Secondary | ICD-10-CM | POA: Insufficient documentation

## 2017-03-13 DIAGNOSIS — Z590 Homelessness: Secondary | ICD-10-CM | POA: Insufficient documentation

## 2017-03-13 DIAGNOSIS — Z794 Long term (current) use of insulin: Secondary | ICD-10-CM | POA: Insufficient documentation

## 2017-03-13 DIAGNOSIS — E1165 Type 2 diabetes mellitus with hyperglycemia: Secondary | ICD-10-CM | POA: Insufficient documentation

## 2017-03-13 LAB — CBG MONITORING, ED
GLUCOSE-CAPILLARY: 518 mg/dL — AB (ref 65–99)
Glucose-Capillary: 394 mg/dL — ABNORMAL HIGH (ref 65–99)
Glucose-Capillary: 339 mg/dL — ABNORMAL HIGH (ref 65–99)
Glucose-Capillary: 541 mg/dL (ref 65–99)

## 2017-03-13 LAB — I-STAT CHEM 8, ED
BUN: 28 mg/dL — ABNORMAL HIGH (ref 6–20)
Calcium, Ion: 1.15 mmol/L (ref 1.15–1.40)
Chloride: 102 mmol/L (ref 101–111)
Creatinine, Ser: 0.9 mg/dL (ref 0.61–1.24)
Glucose, Bld: 531 mg/dL (ref 65–99)
HCT: 39 % (ref 39.0–52.0)
Hemoglobin: 13.3 g/dL (ref 13.0–17.0)
Potassium: 4.4 mmol/L (ref 3.5–5.1)
Sodium: 142 mmol/L (ref 135–145)
TCO2: 27 mmol/L (ref 22–32)

## 2017-03-13 MED ORDER — INSULIN ASPART 100 UNIT/ML ~~LOC~~ SOLN
10.0000 [IU] | Freq: Once | SUBCUTANEOUS | Status: AC
  Administered 2017-03-13: 10 [IU] via SUBCUTANEOUS
  Filled 2017-03-13: qty 1

## 2017-03-13 MED ORDER — SODIUM CHLORIDE 0.9 % IV BOLUS (SEPSIS)
1000.0000 mL | Freq: Once | INTRAVENOUS | Status: AC
  Administered 2017-03-13: 1000 mL via INTRAVENOUS

## 2017-03-13 MED ORDER — LISINOPRIL 20 MG PO TABS
40.0000 mg | ORAL_TABLET | Freq: Once | ORAL | Status: AC
  Administered 2017-03-13: 40 mg via ORAL
  Filled 2017-03-13: qty 2

## 2017-03-13 NOTE — Progress Notes (Signed)
EDCM noted CM consult.  Reviewed chart and basis for consult are unclear as the CM consult order is incomplete and the EDP discharge note does not mention CM instructions.    [facility]  EDCM contacted [name], [facility] at [facility] to [facility] for pt in the community as he has just discharged with abnormal CBGs.

## 2017-03-13 NOTE — ED Provider Notes (Signed)
[facility] DEPT Provider Note   CSN: [id number] Arrival date & time: 03/13/17  1205   History   Chief Complaint Chief Complaint  Patient presents with  . Hyperglycemia    HPI Mike [name] is a 57 y.o. male.  HPI Patient presents to the emergency room for evaluation of hyperglycemia.   Patient has a history of diabetes, hypertension, homelessness, and chronic medical noncompliance.  Patient is well-known to the emergency room and frequently presents here with hyperglycemia.  Patient was just in the emergency room yesterday and was treated and released for recurrent hyperglycemia.  According to the EMS report, the patient was picked up from [facility] because of hyperglycemia.  Patient states his blood sugar was high today.  He states he did take his medications today.  He denies any vomiting fevers chills or other complaints. Past Medical History:  Diagnosis Date  . Anxiety   . Anxiety   . Chronic lower back pain   . Depression   . DKA (diabetic ketoacidoses) ([place]) 07/30/2016  . Hyperlipemia   . Hypertension   . Migraine    "last one was ~ 4 yr ago" (12/23/2014)  . Seizures ([place])    "related to pills for anxiety; if I don't take the pills I'm suppose to take I'll have them" (12/23/2014)  . Type II diabetes mellitus [facility])     Patient Active Problem List   Diagnosis Date Noted  . AKI (acute kidney injury) ([place]) 03/04/2017  . Malnutrition of moderate degree 02/08/2017  . DKA (diabetic ketoacidoses) ([place]) 02/07/2017  . History of stroke 02/07/2017  . Hyperphosphatemia 12/13/2016  . Evaluation by psychiatric service required 11/30/2016  . Lactic acidosis 11/18/2016  . Normocytic anemia 11/06/2016  . HLD (hyperlipidemia) 11/06/2016  . Adjustment disorder with other symptoms 11/05/2016  . Uncontrolled type 2 diabetes mellitus with hyperosmolar nonketotic hyperglycemia ([place]) 08/24/2016  . Essential hypertension 08/24/2016  . Acute ischemic  stroke ([place])   . Protein-calorie malnutrition, severe 12/24/2014  . Homelessness 12/23/2014  . Hypertensive urgency 12/23/2014  . DM (diabetes mellitus) ([place]) 12/23/2014  . Intermittent palpitations 12/23/2014  . Tobacco abuse 12/23/2014  . Pleuritic chest pain 12/23/2014  . Abnormal EKG 12/23/2014  . Type II diabetes mellitus with renal manifestations [facility])     Past Surgical History:  Procedure Laterality Date  . NO PAST SURGERIES         Home Medications    Prior to Admission medications   Medication Sig Start Date End Date Taking? Authorizing Provider  aspirin 325 MG tablet Take 1 tablet (325 mg total) by mouth daily. 03/08/17  Yes Georgette Shell, MD  atorvastatin (LIPITOR) 40 MG tablet Take 1 tablet (40 mg total) by mouth daily. 01/26/17  Yes Cardama, Grayce Sessions, MD  ferrous sulfate 325 (65 FE) MG tablet Take 1 tablet (325 mg total) daily by mouth. Patient taking differently: Take 325 mg by mouth daily as needed (low iron).  11/23/16  Yes Ward, Cyril Mourning N, DO  insulin aspart protamine- aspart (NOVOLOG MIX 70/30) (70-30) 100 UNIT/ML injection Inject 0.3 mLs (30 Units total) into the skin 2 (two) times daily with a meal. 02/13/17  Yes Charlynne Cousins, MD  lisinopril (PRINIVIL,ZESTRIL) 40 MG tablet Take 1 tablet (40 mg total) by mouth daily. 01/26/17  Yes Cardama, Grayce Sessions, MD  metoprolol tartrate (LOPRESSOR) 25 MG tablet Take 0.5 tablets (12.5 mg total) by mouth 2 (two) times daily. 03/07/17  Yes Georgette Shell, MD  blood glucose  meter kit and supplies KIT Dispense based on patient and insurance preference. Use up to four times daily as directed. (FOR ICD-9 250.00, 250.01). 11/22/16   Velvet Bathe, MD  multivitamin (PROSIGHT) TABS tablet Take 1 tablet by mouth daily. Patient not taking: Reported on 01/30/2017 12/22/16   Rai, Vernelle Emerald, MD  protein supplement shake (PREMIER PROTEIN) LIQD Take 325 mLs (11 oz total) by mouth 2 (two) times daily between  meals. Patient not taking: Reported on 01/30/2017 12/22/16   Mendel Corning, MD    Family History Family History  Problem Relation Age of Onset  . Diabetes Mellitus II Mother   . Diabetes Mellitus II Father     Social History Social History   Tobacco Use  . Smoking status: Current Every Day Smoker    Packs/day: 0.10    Years: 40.00    Pack years: 4.00    Types: Cigarettes  . Smokeless tobacco: Never Used  Substance Use Topics  . Alcohol use: Yes    Alcohol/week: 1.2 oz    Types: 2 Cans of beer per week  . Drug use: No    Comment: 12/23/2014 "stopped ~ 10 yrs ago"     Allergies   Ibuprofen and Tylenol [acetaminophen]   Review of Systems Review of Systems  All other systems reviewed and are negative.    Physical Exam Updated Vital Signs BP (!) 177/87   Pulse 60   Temp 98.3 F (36.8 C) (Oral)   Resp 18   SpO2 100%   Physical Exam  Constitutional: No distress.  HENT:  Head: Normocephalic and atraumatic.  Right Ear: External ear normal.  Left Ear: External ear normal.  Eyes: Conjunctivae are normal. Right eye exhibits no discharge. Left eye exhibits no discharge. No scleral icterus.  Neck: Neck supple. No tracheal deviation present.  Cardiovascular: Normal rate, regular rhythm and intact distal pulses.  Pulmonary/Chest: Effort normal and breath sounds normal. No stridor. No respiratory distress. He has no wheezes. He has no rales.  Abdominal: Soft. Bowel sounds are normal. He exhibits no distension. There is no tenderness. There is no rebound and no guarding.  Musculoskeletal: He exhibits no edema or tenderness.  Neurological: He is alert. He has normal strength. No cranial nerve deficit (no facial droop, extraocular movements intact, no slurred speech) or sensory deficit. He exhibits normal muscle tone. He displays no seizure activity. Coordination normal.  Skin: Skin is warm and dry. No rash noted. He is not diaphoretic.  Psychiatric: He has a normal mood and  affect.  Nursing note and vitals reviewed.    ED Treatments / Results  Labs (all labs ordered are listed, but only abnormal results are displayed) Labs Reviewed  CBG MONITORING, ED - Abnormal; Notable for the following components:      Result Value   Glucose-Capillary 518 (*)    All other components within normal limits  I-STAT CHEM 8, ED - Abnormal; Notable for the following components:   BUN 28 (*)    Glucose, Bld 531 (*)    All other components within normal limits  CBG MONITORING, ED - Abnormal; Notable for the following components:   Glucose-Capillary 394 (*)    All other components within normal limits  CBG MONITORING, ED    Radiology No results found.  Procedures Procedures (including critical care time)  Medications Ordered in ED Medications  sodium chloride 0.9 % bolus 1,000 mL (1,000 mLs Intravenous New Bag/Given 03/13/17 1719)  insulin aspart (novoLOG) injection 10 Units (10  Units Subcutaneous Given 03/13/17 1544)  sodium chloride 0.9 % bolus 1,000 mL (0 mLs Intravenous Stopped 03/13/17 1621)  lisinopril (PRINIVIL,ZESTRIL) tablet 40 mg (40 mg Oral Given 03/13/17 1719)     Initial Impression / Assessment and Plan / ED Course  I have reviewed the triage vital signs and the nursing notes.  Pertinent labs & imaging results that were available during my care of the patient were reviewed by me and considered in my medical decision making (see chart for details).  Clinical Course as of Mar 13 1744  Tue Mar 13, 2017  1636 Hyperglycemia without acidosis.  No signs of DKA  [JK]    Clinical Course User Index [JK] Dorie Rank, MD    Pt presents with recurrent hyperglycemia.  Well known to the health system.  Dietary non compliance likely with him at [facility] today.  Pt given IV fluids and insulin.  No DKA.  Stable for discharge.  Final Clinical Impressions(s) / ED Diagnoses   Final diagnoses:  Hyperglycemia    ED Discharge Orders    None       Dorie Rank, MD 03/13/17 [phone]

## 2017-03-13 NOTE — Discharge Instructions (Signed)
Take your medications as prescribed, follow up with your primary care doctor

## 2017-03-13 NOTE — ED Notes (Signed)
Pt was requesting a milkshake. Told Pt he could not have a milkshake as his blood sugar is high and that is why he is here. Gave Pt water after conformation with RN.

## 2017-03-13 NOTE — ED Triage Notes (Signed)
Pt picked up at [facility] By EMS with c/o weakness, Told employees he was diabetic, they thought his blood sugar was low therefore they gave him a sprite and cookie.

## 2017-03-13 NOTE — ED Triage Notes (Signed)
[facility].  [facility]. Vitals: 188/90, 60Hr, 16r, 99% on RA.

## 2017-03-14 ENCOUNTER — Encounter (HOSPITAL_COMMUNITY): Payer: Self-pay | Admitting: Student

## 2017-03-14 ENCOUNTER — Other Ambulatory Visit: Payer: Self-pay

## 2017-03-14 ENCOUNTER — Emergency Department (HOSPITAL_COMMUNITY)
Admission: EM | Admit: 2017-03-14 | Discharge: 2017-03-15 | Disposition: A | Discharge status: Home / Self Care | Payer: Self-pay | Attending: Emergency Medicine | Admitting: Emergency Medicine

## 2017-03-14 ENCOUNTER — Encounter (HOSPITAL_COMMUNITY): Payer: Self-pay

## 2017-03-14 ENCOUNTER — Emergency Department (HOSPITAL_COMMUNITY)
Admission: EM | Admit: 2017-03-14 | Discharge: 2017-03-14 | Disposition: A | Discharge status: Home / Self Care | Payer: Self-pay | Attending: Emergency Medicine | Admitting: Emergency Medicine

## 2017-03-14 DIAGNOSIS — I10 Essential (primary) hypertension: Secondary | ICD-10-CM | POA: Insufficient documentation

## 2017-03-14 DIAGNOSIS — Z79899 Other long term (current) drug therapy: Secondary | ICD-10-CM | POA: Insufficient documentation

## 2017-03-14 DIAGNOSIS — Z794 Long term (current) use of insulin: Secondary | ICD-10-CM | POA: Insufficient documentation

## 2017-03-14 DIAGNOSIS — R739 Hyperglycemia, unspecified: Secondary | ICD-10-CM

## 2017-03-14 DIAGNOSIS — Z7982 Long term (current) use of aspirin: Secondary | ICD-10-CM | POA: Insufficient documentation

## 2017-03-14 DIAGNOSIS — Z9114 Patient's other noncompliance with medication regimen: Secondary | ICD-10-CM | POA: Insufficient documentation

## 2017-03-14 DIAGNOSIS — F17210 Nicotine dependence, cigarettes, uncomplicated: Secondary | ICD-10-CM | POA: Insufficient documentation

## 2017-03-14 DIAGNOSIS — E1165 Type 2 diabetes mellitus with hyperglycemia: Secondary | ICD-10-CM | POA: Insufficient documentation

## 2017-03-14 LAB — BASIC METABOLIC PANEL
Anion gap: 12 (ref 5–15)
Anion gap: 16 — ABNORMAL HIGH (ref 5–15)
BUN: 24 mg/dL — AB (ref 6–20)
BUN: 19 mg/dL (ref 6–20)
CHLORIDE: 106 mmol/L (ref 101–111)
CHLORIDE: 102 mmol/L (ref 101–111)
CO2: 24 mmol/L (ref 22–32)
CO2: 23 mmol/L (ref 22–32)
CREATININE: 0.99 mg/dL (ref 0.61–1.24)
CREATININE: 1.03 mg/dL (ref 0.61–1.24)
Calcium: 9 mg/dL (ref 8.9–10.3)
Calcium: 9.1 mg/dL (ref 8.9–10.3)
GFR calc Af Amer: >60 mL/min (ref >60)
GFR calc Af Amer: >60 mL/min (ref >60)
GFR calc non Af Amer: >60 mL/min (ref >60)
GFR calc non Af Amer: >60 mL/min (ref >60)
GLUCOSE: 571 mg/dL — AB (ref 65–99)
Glucose, Bld: 440 mg/dL — ABNORMAL HIGH (ref 65–99)
Potassium: 4 mmol/L (ref 3.5–5.1)
Potassium: 4.2 mmol/L (ref 3.5–5.1)
Sodium: 142 mmol/L (ref 135–145)
Sodium: 141 mmol/L (ref 135–145)

## 2017-03-14 LAB — URINALYSIS, ROUTINE W REFLEX MICROSCOPIC
Bacteria, UA: NONE SEEN
Bilirubin Urine: NEGATIVE
Hgb urine dipstick: NEGATIVE
Ketones, ur: 20 mg/dL — AB
Leukocytes, UA: NEGATIVE
Nitrite: NEGATIVE
PROTEIN: NEGATIVE mg/dL
SQUAMOUS EPITHELIAL / LPF: NONE SEEN
Specific Gravity, Urine: 1.029 (ref 1.005–1.030)
pH: 5 (ref 5.0–8.0)

## 2017-03-14 LAB — CBC
HCT: 31 % — ABNORMAL LOW (ref 39.0–52.0)
HCT: 31.2 % — ABNORMAL LOW (ref 39.0–52.0)
Hemoglobin: 9.7 g/dL — ABNORMAL LOW (ref 13.0–17.0)
Hemoglobin: 9.4 g/dL — ABNORMAL LOW (ref 13.0–17.0)
MCH: 26.1 pg (ref 26.0–34.0)
MCH: 24.9 pg — AB (ref 26.0–34.0)
MCHC: 31.3 g/dL (ref 30.0–36.0)
MCHC: 30.1 g/dL (ref 30.0–36.0)
MCV: 83.3 fL (ref 78.0–100.0)
MCV: 82.8 fL (ref 78.0–100.0)
PLATELETS: 367 10*3/uL (ref 150–400)
PLATELETS: 375 10*3/uL (ref 150–400)
RBC: 3.72 MIL/uL — ABNORMAL LOW (ref 4.22–5.81)
RBC: 3.77 MIL/uL — AB (ref 4.22–5.81)
RDW: 15.1 % (ref 11.5–15.5)
RDW: 15.2 % (ref 11.5–15.5)
WBC: 5.8 10*3/uL (ref 4.0–10.5)
WBC: 4.5 10*3/uL (ref 4.0–10.5)

## 2017-03-14 LAB — CBG MONITORING, ED
Glucose-Capillary: 400 mg/dL — ABNORMAL HIGH (ref 65–99)
Glucose-Capillary: 360 mg/dL — ABNORMAL HIGH (ref 65–99)
Glucose-Capillary: 565 mg/dL (ref 65–99)

## 2017-03-14 MED ORDER — SODIUM CHLORIDE 0.9 % IV BOLUS (SEPSIS)
1000.0000 mL | Freq: Once | INTRAVENOUS | Status: AC
  Administered 2017-03-15: 1000 mL via INTRAVENOUS

## 2017-03-14 NOTE — ED Notes (Signed)
Pt in the lobby asleep

## 2017-03-14 NOTE — ED Notes (Signed)
Pt given a urinal. He has threatened to urinate in the floor.

## 2017-03-14 NOTE — ED Notes (Signed)
[facility] Expected date:  Expected time:  Means of arrival:  Comments: Room 4

## 2017-03-14 NOTE — ED Notes (Signed)
I attempted twice to collect labs and was unsuccessful 

## 2017-03-14 NOTE — ED Triage Notes (Signed)
Pt CBG reading HI. [facility] Hypertensive(190/82) with EMS. A&Ox4. Ambulatory.

## 2017-03-14 NOTE — ED Provider Notes (Signed)
[name] DEPT Provider Note   CSN: [id number] Arrival date & time: 03/13/17  2224     History   Chief Complaint No chief complaint on file.   HPI Mike [name] is a 57 y.o. male with a hx of T2DM, HTN, homelessness, and chronic medical compliance who returns to the emergency department with complaint of hyperglycemia today. Patient well-known to emergency room with frequent visits for hyperglycemia, most recently seen within past 24 hours and discharged after receiving fluids, insulin, and lisinopril. Per EMS report patient was picked up from [place]- chief complaint to EMS was generalized weakness therefore they presumed hypoglycemia and provided him with sprite and a cookie. Upon arrival to the ED patient chief complaint hyperglycemia to me, no other complaints at this time. No other alleviating/aggravating factors. Denies fever, chills, nausea, vomiting, abdominal pain, or dyspnea.   HPI  Past Medical History:  Diagnosis Date  . Anxiety   . Anxiety   . Chronic lower back pain   . Depression   . DKA (diabetic ketoacidoses) ([place]) 07/30/2016  . Hyperlipemia   . Hypertension   . Migraine    "last one was ~ 4 yr ago" (12/23/2014)  . Seizures ([place])    "related to pills for anxiety; if I don't take the pills I'm suppose to take I'll have them" (12/23/2014)  . Type II diabetes mellitus [facility])     Patient Active Problem List   Diagnosis Date Noted  . AKI (acute kidney injury) ([place]) 03/04/2017  . Malnutrition of moderate degree 02/08/2017  . DKA (diabetic ketoacidoses) ([place]) 02/07/2017  . History of stroke 02/07/2017  . Hyperphosphatemia 12/13/2016  . Evaluation by psychiatric service required 11/30/2016  . Lactic acidosis 11/18/2016  . Normocytic anemia 11/06/2016  . HLD (hyperlipidemia) 11/06/2016  . Adjustment disorder with other symptoms 11/05/2016  . Uncontrolled type 2 diabetes mellitus with hyperosmolar nonketotic hyperglycemia  ([place]) 08/24/2016  . Essential hypertension 08/24/2016  . Acute ischemic stroke ([place])   . Protein-calorie malnutrition, severe 12/24/2014  . Homelessness 12/23/2014  . Hypertensive urgency 12/23/2014  . DM (diabetes mellitus) ([place]) 12/23/2014  . Intermittent palpitations 12/23/2014  . Tobacco abuse 12/23/2014  . Pleuritic chest pain 12/23/2014  . Abnormal EKG 12/23/2014  . Type II diabetes mellitus with renal manifestations [facility])     Past Surgical History:  Procedure Laterality Date  . NO PAST SURGERIES         Home Medications    Prior to Admission medications   Medication Sig Start Date End Date Taking? Authorizing Provider  aspirin 325 MG tablet Take 1 tablet (325 mg total) by mouth daily. 03/08/17  Yes Georgette Shell, MD  atorvastatin (LIPITOR) 40 MG tablet Take 1 tablet (40 mg total) by mouth daily. 01/26/17  Yes Cardama, Grayce Sessions, MD  ferrous sulfate 325 (65 FE) MG tablet Take 1 tablet (325 mg total) daily by mouth. Patient taking differently: Take 325 mg by mouth daily as needed (low iron).  11/23/16  Yes Ward, Cyril Mourning N, DO  insulin aspart protamine- aspart (NOVOLOG MIX 70/30) (70-30) 100 UNIT/ML injection Inject 0.3 mLs (30 Units total) into the skin 2 (two) times daily with a meal. 02/13/17  Yes Charlynne Cousins, MD  lisinopril (PRINIVIL,ZESTRIL) 40 MG tablet Take 1 tablet (40 mg total) by mouth daily. 01/26/17  Yes Cardama, Grayce Sessions, MD  metoprolol tartrate (LOPRESSOR) 25 MG tablet Take 0.5 tablets (12.5 mg total) by mouth 2 (two) times daily. 03/07/17  Yes Landis Gandy  G, MD  blood glucose meter kit and supplies KIT Dispense based on patient and insurance preference. Use up to four times daily as directed. (FOR ICD-9 250.00, 250.01). 11/22/16   Velvet Bathe, MD  multivitamin (PROSIGHT) TABS tablet Take 1 tablet by mouth daily. Patient not taking: Reported on 01/30/2017 12/22/16   Rai, Vernelle Emerald, MD  protein supplement shake (PREMIER PROTEIN) LIQD Take  325 mLs (11 oz total) by mouth 2 (two) times daily between meals. Patient not taking: Reported on 01/30/2017 12/22/16   Mendel Corning, MD    Family History Family History  Problem Relation Age of Onset  . Diabetes Mellitus II Mother   . Diabetes Mellitus II Father     Social History Social History   Tobacco Use  . Smoking status: Current Every Day Smoker    Packs/day: 0.10    Years: 40.00    Pack years: 4.00    Types: Cigarettes  . Smokeless tobacco: Never Used  Substance Use Topics  . Alcohol use: Yes    Alcohol/week: 1.2 oz    Types: 2 Cans of beer per week  . Drug use: No    Comment: 12/23/2014 "stopped ~ 10 yrs ago"     Allergies   Ibuprofen and Tylenol [acetaminophen]   Review of Systems Review of Systems  Constitutional: Negative for chills and fever.  Respiratory: Negative for shortness of breath.   Cardiovascular: Negative for chest pain.  Gastrointestinal: Negative for abdominal distention, abdominal pain, constipation, diarrhea, nausea and vomiting.  Endocrine:       Positive for hyperglycemia.   Genitourinary: Negative for dysuria.  Neurological: Negative for weakness, numbness and headaches.  All other systems reviewed and are negative.    Physical Exam Updated Vital Signs BP (!) 168/83   Pulse (!) 59   Temp 98.5 F (36.9 C) (Oral)   Resp 18   SpO2 99%   Physical Exam  Constitutional: He appears well-developed and well-nourished. No distress.  HENT:  Head: Normocephalic and atraumatic.  Mouth/Throat: Oropharynx is clear and moist.  Eyes: Conjunctivae are normal. Right eye exhibits no discharge. Left eye exhibits no discharge.  Cardiovascular: Normal rate and regular rhythm.  No murmur heard. Pulmonary/Chest: Breath sounds normal. No respiratory distress. He has no wheezes. He has no rales.  Abdominal: Soft. He exhibits no distension. There is no tenderness.  Neurological: He is alert.  Clear speech.   Skin: Skin is warm and dry. No rash  noted.  Psychiatric: He has a normal mood and affect. His behavior is normal.  Nursing note and vitals reviewed.   ED Treatments / Results  Labs Results for orders placed or performed during the hospital encounter of 92/42/68  Basic metabolic panel  Result Value Ref Range   Sodium 142 135 - 145 mmol/L   Potassium 4.0 3.5 - 5.1 mmol/L   Chloride 106 101 - 111 mmol/L   CO2 24 22 - 32 mmol/L   Glucose, Bld 440 (H) 65 - 99 mg/dL   BUN 24 (H) 6 - 20 mg/dL   Creatinine, Ser 0.99 0.61 - 1.24 mg/dL   Calcium 9.0 8.9 - 10.3 mg/dL   GFR calc non Af Amer >60 >60 mL/min   GFR calc Af Amer >60 >60 mL/min   Anion gap 12 5 - 15  CBC  Result Value Ref Range   WBC 5.8 4.0 - 10.5 K/uL   RBC 3.72 (L) 4.22 - 5.81 MIL/uL   Hemoglobin 9.7 (L) 13.0 - 17.0 g/dL  HCT 31.0 (L) 39.0 - 52.0 %   MCV 83.3 78.0 - 100.0 fL   MCH 26.1 26.0 - 34.0 pg   MCHC 31.3 30.0 - 36.0 g/dL   RDW 15.1 11.5 - 15.5 %   Platelets 367 150 - 400 K/uL  CBG monitoring, ED  Result Value Ref Range   Glucose-Capillary 541 (HH) 65 - 99 mg/dL  CBG monitoring, ED  Result Value Ref Range   Glucose-Capillary 400 (H) 65 - 99 mg/dL   No results found. EKG  EKG Interpretation None      Radiology No results found.  Procedures Procedures (including critical care time)  Medications Ordered in ED Medications - No data to display   Initial Impression / Assessment and Plan / ED Course  I have reviewed the triage vital signs and the nursing notes.  Pertinent labs & imaging results that were available during my care of the patient were reviewed by me and considered in my medical decision making (see chart for details).    Patient presents with recurrent hyperglycemia.  He is nontoxic appearing, in no apparent distress, blood pressure is elevated- no indication of HTN emergency- patient aware of need for recheck. Well known to the health system. Dietary and medication noncompliance likely etiology. Patient has a care plan in  place which recommends ruling out acute diabetic emergency and DC home with [facility] follow up.  Lab work reviewed- hgb 9.7- consistent with patient's baseline. Glucose 440- appears consistent with his baseline glucose, no anion gap, acidosis, or significant electrolyte abnormality. No DKA. Patient received IVF. Discussed with supervising physician Dr. Christy Gentles- instructed DC with instructions to take his medications as prescribed with [facility] follow up. I discussed results, treatment plan, need for [facility] follow-up, and return precautions with the patient. Provided opportunity for questions, patient confirmed understanding and is in agreement with plan.   Final Clinical Impressions(s) / ED Diagnoses   Final diagnoses:  Hyperglycemia    ED Discharge Orders    None       Amaryllis Dyke, PA-C 03/14/17 [id number]    Ripley Fraise, MD 03/14/17 2312

## 2017-03-14 NOTE — Discharge Instructions (Signed)
You were seen in the ER today for hyperglycemia.  You received fluids.  You do not have any significant electrolyte abnormalities or problems with your kidney function.  Your glucose was 440-it is important that you take your medications as prescribed. Your hemoglobin was 9.7 which is consistent with your previous labs.   Your blood pressure was elevated today, it is important that you take your antihypertensive medications. [facility] for further management of your diabetes.  Return to the emergency department for any new or worsening symptoms.

## 2017-03-15 ENCOUNTER — Other Ambulatory Visit: Payer: Self-pay

## 2017-03-15 ENCOUNTER — Encounter (HOSPITAL_COMMUNITY): Payer: Self-pay | Admitting: Emergency Medicine

## 2017-03-15 ENCOUNTER — Emergency Department (HOSPITAL_COMMUNITY)
Admission: EM | Admit: 2017-03-15 | Discharge: 2017-03-16 | Disposition: A | Discharge status: Home / Self Care | Payer: Self-pay | Attending: Emergency Medicine | Admitting: Emergency Medicine

## 2017-03-15 DIAGNOSIS — E876 Hypokalemia: Secondary | ICD-10-CM | POA: Insufficient documentation

## 2017-03-15 DIAGNOSIS — Z7982 Long term (current) use of aspirin: Secondary | ICD-10-CM | POA: Insufficient documentation

## 2017-03-15 DIAGNOSIS — E1165 Type 2 diabetes mellitus with hyperglycemia: Secondary | ICD-10-CM | POA: Insufficient documentation

## 2017-03-15 DIAGNOSIS — Z794 Long term (current) use of insulin: Secondary | ICD-10-CM | POA: Insufficient documentation

## 2017-03-15 DIAGNOSIS — I10 Essential (primary) hypertension: Secondary | ICD-10-CM | POA: Insufficient documentation

## 2017-03-15 DIAGNOSIS — Z8673 Personal history of transient ischemic attack (TIA), and cerebral infarction without residual deficits: Secondary | ICD-10-CM | POA: Insufficient documentation

## 2017-03-15 DIAGNOSIS — F17210 Nicotine dependence, cigarettes, uncomplicated: Secondary | ICD-10-CM | POA: Insufficient documentation

## 2017-03-15 DIAGNOSIS — R739 Hyperglycemia, unspecified: Secondary | ICD-10-CM

## 2017-03-15 LAB — CBG MONITORING, ED
GLUCOSE-CAPILLARY: 347 mg/dL — AB (ref 65–99)
Glucose-Capillary: 304 mg/dL — ABNORMAL HIGH (ref 65–99)
Glucose-Capillary: 427 mg/dL — ABNORMAL HIGH (ref 65–99)
Glucose-Capillary: 531 mg/dL (ref 65–99)

## 2017-03-15 MED ORDER — INSULIN ASPART PROT & ASPART (70-30 MIX) 100 UNIT/ML ~~LOC~~ SUSP
30.0000 [IU] | Freq: Once | SUBCUTANEOUS | Status: AC
  Administered 2017-03-15: 30 [IU] via SUBCUTANEOUS
  Filled 2017-03-15: qty 10

## 2017-03-15 NOTE — ED Notes (Signed)
Pt denies N/V or pain. Pt resting.

## 2017-03-15 NOTE — ED Notes (Signed)
Pt refused to sign for discharge. Pt had urinated on himself and dry paper scrubs were given and pt refused to change into them. Pt refused to get out of bed and security was called to facilitate the pts removal from the room and property.

## 2017-03-15 NOTE — ED Triage Notes (Signed)
Pt brought to ED by [facility] from home for c/o hyperglycemia when asked about symptoms pt state I don't have nothing I just know is high. Pt no complaint with HTN meds or DM medication. cbg by EMS 521. Pt was eating snacks on EMS arrival.

## 2017-03-15 NOTE — ED Provider Notes (Signed)
[facility] DEPT Provider Note   CSN: [id number] Arrival date & time: 03/14/17  2156     History   Chief Complaint Chief Complaint  Patient presents with  . Hyperglycemia    HPI Mike [name] is a 57 y.o. male.  The patient presents for evaluation and management of hyperglycemia. No nausea or vomiting. He denies pain. He is well known to the emergency department for repeated episodes of hyperglycemia and medication noncompliance. No fever.    The history is provided by the patient.  Hyperglycemia  Associated symptoms: polyuria   Associated symptoms: no fever     Past Medical History:  Diagnosis Date  . Anxiety   . Anxiety   . Chronic lower back pain   . Depression   . DKA (diabetic ketoacidoses) ([place]) 07/30/2016  . Hyperlipemia   . Hypertension   . Migraine    "last one was ~ 4 yr ago" (12/23/2014)  . Seizures ([place])    "related to pills for anxiety; if I don't take the pills I'm suppose to take I'll have them" (12/23/2014)  . Type II diabetes mellitus [facility])     Patient Active Problem List   Diagnosis Date Noted  . AKI (acute kidney injury) ([place]) 03/04/2017  . Malnutrition of moderate degree 02/08/2017  . DKA (diabetic ketoacidoses) ([place]) 02/07/2017  . History of stroke 02/07/2017  . Hyperphosphatemia 12/13/2016  . Evaluation by psychiatric service required 11/30/2016  . Lactic acidosis 11/18/2016  . Normocytic anemia 11/06/2016  . HLD (hyperlipidemia) 11/06/2016  . Adjustment disorder with other symptoms 11/05/2016  . Uncontrolled type 2 diabetes mellitus with hyperosmolar nonketotic hyperglycemia ([place]) 08/24/2016  . Essential hypertension 08/24/2016  . Acute ischemic stroke ([place])   . Protein-calorie malnutrition, severe 12/24/2014  . Homelessness 12/23/2014  . Hypertensive urgency 12/23/2014  . DM (diabetes mellitus) ([place]) 12/23/2014  . Intermittent palpitations 12/23/2014  . Tobacco abuse 12/23/2014  . Pleuritic chest  pain 12/23/2014  . Abnormal EKG 12/23/2014  . Type II diabetes mellitus with renal manifestations [facility])     Past Surgical History:  Procedure Laterality Date  . NO PAST SURGERIES         Home Medications    Prior to Admission medications   Medication Sig Start Date End Date Taking? Authorizing Provider  aspirin 325 MG tablet Take 1 tablet (325 mg total) by mouth daily. 03/08/17  Yes Georgette Shell, MD  atorvastatin (LIPITOR) 40 MG tablet Take 1 tablet (40 mg total) by mouth daily. 01/26/17  Yes Cardama, Grayce Sessions, MD  ferrous sulfate 325 (65 FE) MG tablet Take 1 tablet (325 mg total) daily by mouth. Patient taking differently: Take 325 mg by mouth daily as needed (low iron).  11/23/16  Yes Ward, Cyril Mourning N, DO  insulin aspart protamine- aspart (NOVOLOG MIX 70/30) (70-30) 100 UNIT/ML injection Inject 0.3 mLs (30 Units total) into the skin 2 (two) times daily with a meal. 02/13/17  Yes Charlynne Cousins, MD  lisinopril (PRINIVIL,ZESTRIL) 40 MG tablet Take 1 tablet (40 mg total) by mouth daily. 01/26/17  Yes Cardama, Grayce Sessions, MD  metoprolol tartrate (LOPRESSOR) 25 MG tablet Take 0.5 tablets (12.5 mg total) by mouth 2 (two) times daily. 03/07/17  Yes Georgette Shell, MD  blood glucose meter kit and supplies KIT Dispense based on patient and insurance preference. Use up to four times daily as directed. (FOR ICD-9 250.00, 250.01). 11/22/16   Velvet Bathe, MD    Family History Family History  Problem  Relation Age of Onset  . Diabetes Mellitus II Mother   . Diabetes Mellitus II Father     Social History Social History   Tobacco Use  . Smoking status: Current Every Day Smoker    Packs/day: 0.10    Years: 40.00    Pack years: 4.00    Types: Cigarettes  . Smokeless tobacco: Never Used  Substance Use Topics  . Alcohol use: Yes    Alcohol/week: 1.2 oz    Types: 2 Cans of beer per week  . Drug use: No    Comment: 12/23/2014 "stopped ~ 10 yrs ago"     Allergies     Ibuprofen and Tylenol [acetaminophen]   Review of Systems Review of Systems  Constitutional: Negative for chills and fever.  HENT: Negative.   Respiratory: Negative.   Cardiovascular: Negative.   Gastrointestinal: Negative.   Endocrine: Positive for polyuria.  Musculoskeletal: Negative.   Skin: Negative.   Neurological: Negative.      Physical Exam Updated Vital Signs BP (!) 196/88   Pulse (!) 53   Temp 97.9 F (36.6 C) (Oral)   Resp 18   SpO2 99%   Physical Exam  Constitutional: He is oriented to person, place, and time. He appears well-developed and well-nourished.  HENT:  Head: Normocephalic.  Neck: Normal range of motion. Neck supple.  Cardiovascular: Normal rate and regular rhythm.  Pulmonary/Chest: Effort normal and breath sounds normal. He has no wheezes. He has no rales.  Abdominal: Soft. Bowel sounds are normal. There is no tenderness. There is no rebound and no guarding.  Musculoskeletal: Normal range of motion.  Neurological: He is alert and oriented to person, place, and time.  Skin: Skin is warm and dry. No rash noted.  Psychiatric: He has a normal mood and affect.     ED Treatments / Results  Labs (all labs ordered are listed, but only abnormal results are displayed) Labs Reviewed  BASIC METABOLIC PANEL - Abnormal; Notable for the following components:      Result Value   Glucose, Bld 571 (*)    Anion gap 16 (*)    All other components within normal limits  CBC - Abnormal; Notable for the following components:   RBC 3.77 (*)    Hemoglobin 9.4 (*)    HCT 31.2 (*)    MCH 24.9 (*)    All other components within normal limits  URINALYSIS, ROUTINE W REFLEX MICROSCOPIC - Abnormal; Notable for the following components:   Color, Urine STRAW (*)    Glucose, UA >=500 (*)    Ketones, ur 20 (*)    All other components within normal limits  CBG MONITORING, ED - Abnormal; Notable for the following components:   Glucose-Capillary 565 (*)    All other  components within normal limits  CBG MONITORING, ED - Abnormal; Notable for the following components:   Glucose-Capillary 531 (*)    All other components within normal limits  CBG MONITORING, ED - Abnormal; Notable for the following components:   Glucose-Capillary 427 (*)    All other components within normal limits  CBG MONITORING, ED - Abnormal; Notable for the following components:   Glucose-Capillary 347 (*)    All other components within normal limits    EKG  EKG Interpretation None       Radiology No results found.  Procedures Procedures (including critical care time)  Medications Ordered in ED Medications  sodium chloride 0.9 % bolus 1,000 mL (0 mLs Intravenous Stopped 03/15/17 0236)  insulin aspart protamine- aspart (NOVOLOG MIX 70/30) injection 30 Units (30 Units Subcutaneous Given 03/15/17 0208)     Initial Impression / Assessment and Plan / ED Course  I have reviewed the triage vital signs and the nursing notes.  Pertinent labs & imaging results that were available during my care of the patient were reviewed by me and considered in my medical decision making (see chart for details).     Patient presents with hyperglycemia, last seen yesterday.   Labs showing high blood sugar, responsive to fluids. His regular dose of insulin was provided. No evidence of DKA. CBG trending downward. He can be discharged home with no diabetic emergency present.  Hypertension noted. He denies pain, SOB. Encouraged him to take his medications at home.   Final Clinical Impressions(s) / ED Diagnoses   Final diagnoses:  None   1. Chronic medication noncompliance 2. hyperglycemia without acidosis 3. HTN  ED Discharge Orders    None       Charlann Lange, Hershal Coria 03/15/17 0301    Duffy Bruce, MD 03/15/17 [phone]

## 2017-03-16 ENCOUNTER — Encounter (HOSPITAL_COMMUNITY): Payer: Self-pay

## 2017-03-16 DIAGNOSIS — I10 Essential (primary) hypertension: Secondary | ICD-10-CM | POA: Insufficient documentation

## 2017-03-16 DIAGNOSIS — Z794 Long term (current) use of insulin: Secondary | ICD-10-CM | POA: Insufficient documentation

## 2017-03-16 DIAGNOSIS — Z7982 Long term (current) use of aspirin: Secondary | ICD-10-CM | POA: Insufficient documentation

## 2017-03-16 DIAGNOSIS — F17210 Nicotine dependence, cigarettes, uncomplicated: Secondary | ICD-10-CM | POA: Insufficient documentation

## 2017-03-16 DIAGNOSIS — Z79899 Other long term (current) drug therapy: Secondary | ICD-10-CM | POA: Insufficient documentation

## 2017-03-16 DIAGNOSIS — E1165 Type 2 diabetes mellitus with hyperglycemia: Secondary | ICD-10-CM | POA: Insufficient documentation

## 2017-03-16 DIAGNOSIS — E785 Hyperlipidemia, unspecified: Secondary | ICD-10-CM | POA: Insufficient documentation

## 2017-03-16 LAB — URINALYSIS, ROUTINE W REFLEX MICROSCOPIC
BACTERIA UA: NONE SEEN
BILIRUBIN URINE: NEGATIVE
Glucose, UA: >=500 mg/dL — AB
Hgb urine dipstick: NEGATIVE
KETONES UR: NEGATIVE mg/dL
Leukocytes, UA: NEGATIVE
NITRITE: NEGATIVE
PROTEIN: NEGATIVE mg/dL
SPECIFIC GRAVITY, URINE: 1.024 (ref 1.005–1.030)
pH: 6 (ref 5.0–8.0)

## 2017-03-16 LAB — BASIC METABOLIC PANEL
ANION GAP: 9 (ref 5–15)
Anion gap: 10 (ref 5–15)
BUN: 12 mg/dL (ref 6–20)
BUN: 12 mg/dL (ref 6–20)
CHLORIDE: 107 mmol/L (ref 101–111)
CO2: 23 mmol/L (ref 22–32)
CO2: 25 mmol/L (ref 22–32)
Calcium: 8.8 mg/dL — ABNORMAL LOW (ref 8.9–10.3)
Calcium: 8.9 mg/dL (ref 8.9–10.3)
Chloride: 99 mmol/L — ABNORMAL LOW (ref 101–111)
Creatinine, Ser: 0.83 mg/dL (ref 0.61–1.24)
Creatinine, Ser: 1 mg/dL (ref 0.61–1.24)
GFR calc Af Amer: >60 mL/min (ref >60)
GFR calc Af Amer: >60 mL/min (ref >60)
GFR calc non Af Amer: >60 mL/min (ref >60)
GLUCOSE: 320 mg/dL — AB (ref 65–99)
Glucose, Bld: 712 mg/dL (ref 65–99)
POTASSIUM: 3.2 mmol/L — AB (ref 3.5–5.1)
POTASSIUM: 3.9 mmol/L (ref 3.5–5.1)
Sodium: 133 mmol/L — ABNORMAL LOW (ref 135–145)
Sodium: 140 mmol/L (ref 135–145)

## 2017-03-16 LAB — CBC
HEMATOCRIT: 30.7 % — AB (ref 39.0–52.0)
HEMOGLOBIN: 9.3 g/dL — AB (ref 13.0–17.0)
MCH: 24.7 pg — ABNORMAL LOW (ref 26.0–34.0)
MCHC: 30.3 g/dL (ref 30.0–36.0)
MCV: 81.6 fL (ref 78.0–100.0)
Platelets: 399 10*3/uL (ref 150–400)
RBC: 3.76 MIL/uL — ABNORMAL LOW (ref 4.22–5.81)
RDW: 15 % (ref 11.5–15.5)
WBC: 6.2 10*3/uL (ref 4.0–10.5)

## 2017-03-16 LAB — CBG MONITORING, ED: GLUCOSE-CAPILLARY: 368 mg/dL — AB (ref 65–99)

## 2017-03-16 MED ORDER — POTASSIUM CHLORIDE CRYS ER 20 MEQ PO TBCR
40.0000 meq | EXTENDED_RELEASE_TABLET | Freq: Once | ORAL | Status: AC
  Administered 2017-03-16: 40 meq via ORAL

## 2017-03-16 MED ORDER — POTASSIUM CHLORIDE CRYS ER 10 MEQ PO TBCR
EXTENDED_RELEASE_TABLET | ORAL | Status: AC
  Filled 2017-03-16: qty 4

## 2017-03-16 MED ORDER — INSULIN ASPART 100 UNIT/ML ~~LOC~~ SOLN
10.0000 [IU] | Freq: Once | SUBCUTANEOUS | Status: AC
  Administered 2017-03-16: 10 [IU] via SUBCUTANEOUS
  Filled 2017-03-16: qty 1

## 2017-03-16 NOTE — ED Notes (Signed)
Checked CBG meter reads HI, RN Jessica informed

## 2017-03-16 NOTE — ED Triage Notes (Signed)
Pt comes via [facility] for hyperglycemia seen yesterday for the same, no insulin today

## 2017-03-16 NOTE — ED Provider Notes (Signed)
[facility] EMERGENCY DEPARTMENT Provider Note   CSN: [id number] Arrival date & time: 03/15/17  2028     History   Chief Complaint Chief Complaint  Patient presents with  . Hyperglycemia    HPI Mike [name] is a 57 y.o. male.   Hyperglycemia  He has history of diabetes, hypertension, hyperlipidemia, anxiety and comes in because his blood glucose at home was elevated.  He states it was 300.  He states he did take his insulin this morning, but he does have a history of noncompliance.  He denies fever, chills, sweats.  He denies nausea or vomiting.  Denies abdominal pain.  Of note, patient is a very poor and somewhat evasive historian.  Past Medical History:  Diagnosis Date  . Anxiety   . Anxiety   . Chronic lower back pain   . Depression   . DKA (diabetic ketoacidoses) ([place]) 07/30/2016  . Hyperlipemia   . Hypertension   . Migraine    "last one was ~ 4 yr ago" (12/23/2014)  . Seizures ([place])    "related to pills for anxiety; if I don't take the pills I'm suppose to take I'll have them" (12/23/2014)  . Type II diabetes mellitus [facility])     Patient Active Problem List   Diagnosis Date Noted  . AKI (acute kidney injury) ([place]) 03/04/2017  . Malnutrition of moderate degree 02/08/2017  . DKA (diabetic ketoacidoses) ([place]) 02/07/2017  . History of stroke 02/07/2017  . Hyperphosphatemia 12/13/2016  . Evaluation by psychiatric service required 11/30/2016  . Lactic acidosis 11/18/2016  . Normocytic anemia 11/06/2016  . HLD (hyperlipidemia) 11/06/2016  . Adjustment disorder with other symptoms 11/05/2016  . Uncontrolled type 2 diabetes mellitus with hyperosmolar nonketotic hyperglycemia ([place]) 08/24/2016  . Essential hypertension 08/24/2016  . Acute ischemic stroke ([place])   . Protein-calorie malnutrition, severe 12/24/2014  . Homelessness 12/23/2014  . Hypertensive urgency 12/23/2014  . DM (diabetes mellitus) ([place]) 12/23/2014  . Intermittent palpitations  12/23/2014  . Tobacco abuse 12/23/2014  . Pleuritic chest pain 12/23/2014  . Abnormal EKG 12/23/2014  . Type II diabetes mellitus with renal manifestations [facility])     Past Surgical History:  Procedure Laterality Date  . NO PAST SURGERIES         Home Medications    Prior to Admission medications   Medication Sig Start Date End Date Taking? Authorizing Provider  aspirin 325 MG tablet Take 1 tablet (325 mg total) by mouth daily. 03/08/17   Georgette Shell, MD  atorvastatin (LIPITOR) 40 MG tablet Take 1 tablet (40 mg total) by mouth daily. 01/26/17   Cardama, Grayce Sessions, MD  blood glucose meter kit and supplies KIT Dispense based on patient and insurance preference. Use up to four times daily as directed. (FOR ICD-9 250.00, 250.01). 11/22/16   Velvet Bathe, MD  ferrous sulfate 325 (65 FE) MG tablet Take 1 tablet (325 mg total) daily by mouth. Patient taking differently: Take 325 mg by mouth daily as needed (low iron).  11/23/16   Ward, Delice Bison, DO  insulin aspart protamine- aspart (NOVOLOG MIX 70/30) (70-30) 100 UNIT/ML injection Inject 0.3 mLs (30 Units total) into the skin 2 (two) times daily with a meal. 02/13/17   Charlynne Cousins, MD  lisinopril (PRINIVIL,ZESTRIL) 40 MG tablet Take 1 tablet (40 mg total) by mouth daily. 01/26/17   Fatima Blank, MD  metoprolol tartrate (LOPRESSOR) 25 MG tablet Take 0.5 tablets (12.5 mg total) by mouth 2 (two) times daily.  03/07/17   Georgette Shell, MD    Family History Family History  Problem Relation Age of Onset  . Diabetes Mellitus II Mother   . Diabetes Mellitus II Father     Social History Social History   Tobacco Use  . Smoking status: Current Every Day Smoker    Packs/day: 0.10    Years: 40.00    Pack years: 4.00    Types: Cigarettes  . Smokeless tobacco: Never Used  Substance Use Topics  . Alcohol use: Yes    Alcohol/week: 1.2 oz    Types: 2 Cans of beer per week  . Drug use: No    Comment: 12/23/2014  "stopped ~ 10 yrs ago"     Allergies   Ibuprofen and Tylenol [acetaminophen]   Review of Systems Review of Systems  All other systems reviewed and are negative.    Physical Exam Updated Vital Signs BP (!) 163/80 (BP Location: Right Arm)   Pulse 77   Temp 98.7 F (37.1 C) (Oral)   Resp 18   Ht '6\' 4"'$  (1.93 m)   Wt 77.1 kg (170 lb)   SpO2 100%   BMI 20.69 kg/m    Physical Exam  Nursing note and vitals reviewed.  57 year old male, resting comfortably and in no acute distress. Vital signs are elevated systolic blood pressure. Oxygen saturation is 100%, which is normal. Head is normocephalic and atraumatic. PERRLA, EOMI. Oropharynx is clear. Neck is nontender and supple without adenopathy or JVD. Back is nontender and there is no CVA tenderness. Lungs are clear without rales, wheezes, or rhonchi. Chest is nontender. Heart has regular rate and rhythm without murmur. Abdomen is soft, flat, nontender without masses or hepatosplenomegaly and peristalsis is normoactive. Extremities have no cyanosis or edema, full range of motion is present. Skin is warm and dry without rash. Neurologic: Mental status is normal, cranial nerves are intact, there are no motor or sensory deficits.  ED Treatments / Results  Labs (all labs ordered are listed, but only abnormal results are displayed) Labs Reviewed  BASIC METABOLIC PANEL - Abnormal; Notable for the following components:      Result Value   Potassium 3.2 (*)    Glucose, Bld 320 (*)    All other components within normal limits  CBG MONITORING, ED - Abnormal; Notable for the following components:   Glucose-Capillary 304 (*)    All other components within normal limits    Procedures Procedures  Medications Ordered in ED Medications  potassium chloride SA (K-DUR,KLOR-CON) CR tablet 40 mEq (not administered)  insulin aspart (novoLOG) injection 10 Units (10 Units Subcutaneous Given 03/16/17 0022)     Initial Impression /  Assessment and Plan / ED Course  I have reviewed the triage vital signs and the nursing notes.  Pertinent lab results that were available during my care of the patient were reviewed by me and considered in my medical decision making (see chart for details).  Mild hyperglycemia.  He is given a dose of subcutaneous insulin.  Will check basic metabolic panel to make sure he is not an early ketoacidosis.  Old records are reviewed showing numerous ED visits for hyperglycemia, and several hospitalizations for DKA and hyperglycemia.  Metabolic panel shows no evidence of ketoacidosis.  Mild hypokalemia is noted and is given a dose of oral potassium and discharged with instructions to follow-up with PCP.  Final Clinical Impressions(s) / ED Diagnoses   Final diagnoses:  Hyperglycemia  Hypokalemia    ED  Discharge Orders    None      Delora Fuel, MD 47/20/72 [phone]

## 2017-03-16 NOTE — ED Notes (Signed)
Pt will wait in lobby, pt stable for d/c and understands follow up

## 2017-03-17 ENCOUNTER — Emergency Department (HOSPITAL_COMMUNITY)
Admission: EM | Admit: 2017-03-17 | Discharge: 2017-03-17 | Disposition: A | Discharge status: Home / Self Care | Payer: Self-pay | Attending: Emergency Medicine | Admitting: Emergency Medicine

## 2017-03-17 DIAGNOSIS — R739 Hyperglycemia, unspecified: Secondary | ICD-10-CM

## 2017-03-17 NOTE — ED Notes (Signed)
Pt reports his blood sugar is elevated.

## 2017-03-17 NOTE — ED Provider Notes (Signed)
[facility] EMERGENCY DEPARTMENT Provider Note   CSN: [id number] Arrival date & time: 03/16/17  2224     History   Chief Complaint Chief Complaint  Patient presents with  . Hyperglycemia    HPI Karen Sermeno is a 57 y.o. male.  HPI Patient is a 57 year old male with a history of diabetes on insulin and a history of noncompliance and frequent ER visits for elevated blood sugars.  He presents today because of an elevated blood sugar.  He denies vomiting diarrhea.  Denies fevers and chills.  No chest pain or abdominal pain.  Denies urinary complaints besides urinary frequency.  He reports compliance with his meds and reports dietary compliance.  He has had 40 visits to the emergency department in the past 6 months.   Past Medical History:  Diagnosis Date  . Anxiety   . Anxiety   . Chronic lower back pain   . Depression   . DKA (diabetic ketoacidoses) ([place]) 07/30/2016  . Hyperlipemia   . Hypertension   . Migraine    "last one was ~ 4 yr ago" (12/23/2014)  . Seizures ([place])    "related to pills for anxiety; if I don't take the pills I'm suppose to take I'll have them" (12/23/2014)  . Type II diabetes mellitus [facility])     Patient Active Problem List   Diagnosis Date Noted  . AKI (acute kidney injury) ([place]) 03/04/2017  . Malnutrition of moderate degree 02/08/2017  . DKA (diabetic ketoacidoses) ([place]) 02/07/2017  . History of stroke 02/07/2017  . Hyperphosphatemia 12/13/2016  . Evaluation by psychiatric service required 11/30/2016  . Lactic acidosis 11/18/2016  . Normocytic anemia 11/06/2016  . HLD (hyperlipidemia) 11/06/2016  . Adjustment disorder with other symptoms 11/05/2016  . Uncontrolled type 2 diabetes mellitus with hyperosmolar nonketotic hyperglycemia ([place]) 08/24/2016  . Essential hypertension 08/24/2016  . Acute ischemic stroke ([place])   . Protein-calorie malnutrition, severe 12/24/2014  . Homelessness 12/23/2014  . Hypertensive urgency  12/23/2014  . DM (diabetes mellitus) ([place]) 12/23/2014  . Intermittent palpitations 12/23/2014  . Tobacco abuse 12/23/2014  . Pleuritic chest pain 12/23/2014  . Abnormal EKG 12/23/2014  . Type II diabetes mellitus with renal manifestations [facility])     Past Surgical History:  Procedure Laterality Date  . NO PAST SURGERIES         Home Medications    Prior to Admission medications   Medication Sig Start Date End Date Taking? Authorizing Provider  aspirin 325 MG tablet Take 1 tablet (325 mg total) by mouth daily. 03/08/17   Georgette Shell, MD  atorvastatin (LIPITOR) 40 MG tablet Take 1 tablet (40 mg total) by mouth daily. 01/26/17   Cardama, Grayce Sessions, MD  blood glucose meter kit and supplies KIT Dispense based on patient and insurance preference. Use up to four times daily as directed. (FOR ICD-9 250.00, 250.01). 11/22/16   Velvet Bathe, MD  ferrous sulfate 325 (65 FE) MG tablet Take 1 tablet (325 mg total) daily by mouth. Patient taking differently: Take 325 mg by mouth daily as needed (low iron).  11/23/16   Ward, Delice Bison, DO  insulin aspart protamine- aspart (NOVOLOG MIX 70/30) (70-30) 100 UNIT/ML injection Inject 0.3 mLs (30 Units total) into the skin 2 (two) times daily with a meal. 02/13/17   Charlynne Cousins, MD  lisinopril (PRINIVIL,ZESTRIL) 40 MG tablet Take 1 tablet (40 mg total) by mouth daily. 01/26/17   Fatima Blank, MD  metoprolol tartrate (LOPRESSOR) 25 MG  tablet Take 0.5 tablets (12.5 mg total) by mouth 2 (two) times daily. 03/07/17   Georgette Shell, MD    Family History Family History  Problem Relation Age of Onset  . Diabetes Mellitus II Mother   . Diabetes Mellitus II Father     Social History Social History   Tobacco Use  . Smoking status: Current Every Day Smoker    Packs/day: 0.10    Years: 40.00    Pack years: 4.00    Types: Cigarettes  . Smokeless tobacco: Never Used  Substance Use Topics  . Alcohol use: Yes     Alcohol/week: 1.2 oz    Types: 2 Cans of beer per week  . Drug use: No    Comment: 12/23/2014 "stopped ~ 10 yrs ago"     Allergies   Ibuprofen and Tylenol [acetaminophen]   Review of Systems Review of Systems  All other systems reviewed and are negative.    Physical Exam Updated Vital Signs BP (!) 172/89 (BP Location: Left Arm)   Pulse 77   Temp 98.9 F (37.2 C) (Oral)   Resp 18   SpO2 100%   Physical Exam  Constitutional: He is oriented to person, place, and time. He appears well-developed and well-nourished.  HENT:  Head: Normocephalic and atraumatic.  Eyes: EOM are normal.  Neck: Normal range of motion.  Cardiovascular: Normal rate, regular rhythm, normal heart sounds and intact distal pulses.  Pulmonary/Chest: Effort normal and breath sounds normal. No respiratory distress.  Abdominal: Soft. He exhibits no distension. There is no tenderness.  Musculoskeletal: Normal range of motion.  Neurological: He is alert and oriented to person, place, and time.  Skin: Skin is warm and dry.  Psychiatric: He has a normal mood and affect. Judgment normal.  Nursing note and vitals reviewed.    ED Treatments / Results  Labs (all labs ordered are listed, but only abnormal results are displayed) Labs Reviewed  BASIC METABOLIC PANEL - Abnormal; Notable for the following components:      Result Value   Sodium 133 (*)    Chloride 99 (*)    Glucose, Bld 712 (*)    Calcium 8.8 (*)    All other components within normal limits  CBC - Abnormal; Notable for the following components:   RBC 3.76 (*)    Hemoglobin 9.3 (*)    HCT 30.7 (*)    MCH 24.7 (*)    All other components within normal limits  URINALYSIS, ROUTINE W REFLEX MICROSCOPIC - Abnormal; Notable for the following components:   Color, Urine STRAW (*)    Glucose, UA >=500 (*)    Squamous Epithelial / LPF 0-5 (*)    All other components within normal limits  CBG MONITORING, ED - Abnormal; Notable for the following  components:   Glucose-Capillary >600 (*)    All other components within normal limits    EKG  EKG Interpretation None       Radiology No results found.  Procedures Procedures (including critical care time)  Medications Ordered in ED Medications - No data to display   Initial Impression / Assessment and Plan / ED Course  I have reviewed the triage vital signs and the nursing notes.  Pertinent labs & imaging results that were available during my care of the patient were reviewed by me and considered in my medical decision making (see chart for details).     Hyperglycemia see me.  Long-standing history of noncompliance.  No signs of DKA.  Anion gap normal.  Vital signs stable.  Discharged home in good condition with instructions to follow-up with his primary care physician and meet with a diabetic educator.  Final Clinical Impressions(s) / ED Diagnoses   Final diagnoses:  Hyperglycemia    ED Discharge Orders    None       Jola Schmidt, MD 03/17/17 [phone]

## 2017-03-19 ENCOUNTER — Emergency Department (HOSPITAL_COMMUNITY)
Admission: EM | Admit: 2017-03-19 | Discharge: 2017-03-19 | Disposition: A | Discharge status: Home / Self Care | Payer: Self-pay | Attending: Emergency Medicine | Admitting: Emergency Medicine

## 2017-03-19 ENCOUNTER — Encounter (HOSPITAL_COMMUNITY): Payer: Self-pay | Admitting: Emergency Medicine

## 2017-03-19 DIAGNOSIS — F17210 Nicotine dependence, cigarettes, uncomplicated: Secondary | ICD-10-CM | POA: Insufficient documentation

## 2017-03-19 DIAGNOSIS — Z79899 Other long term (current) drug therapy: Secondary | ICD-10-CM | POA: Insufficient documentation

## 2017-03-19 DIAGNOSIS — R739 Hyperglycemia, unspecified: Secondary | ICD-10-CM

## 2017-03-19 DIAGNOSIS — Z794 Long term (current) use of insulin: Secondary | ICD-10-CM | POA: Insufficient documentation

## 2017-03-19 DIAGNOSIS — I10 Essential (primary) hypertension: Secondary | ICD-10-CM | POA: Insufficient documentation

## 2017-03-19 DIAGNOSIS — E1165 Type 2 diabetes mellitus with hyperglycemia: Secondary | ICD-10-CM | POA: Insufficient documentation

## 2017-03-19 LAB — BASIC METABOLIC PANEL
Anion gap: 16 — ABNORMAL HIGH (ref 5–15)
BUN: 15 mg/dL (ref 6–20)
CALCIUM: 9.6 mg/dL (ref 8.9–10.3)
CHLORIDE: 96 mmol/L — AB (ref 101–111)
CO2: 26 mmol/L (ref 22–32)
CREATININE: 1.13 mg/dL (ref 0.61–1.24)
GFR calc non Af Amer: >60 mL/min (ref >60)
Glucose, Bld: 693 mg/dL (ref 65–99)
Potassium: 4.3 mmol/L (ref 3.5–5.1)
SODIUM: 138 mmol/L (ref 135–145)

## 2017-03-19 LAB — CBC
HCT: 35.7 % — ABNORMAL LOW (ref 39.0–52.0)
Hemoglobin: 10.7 g/dL — ABNORMAL LOW (ref 13.0–17.0)
MCH: 25.1 pg — AB (ref 26.0–34.0)
MCHC: 30 g/dL (ref 30.0–36.0)
MCV: 83.6 fL (ref 78.0–100.0)
PLATELETS: 497 10*3/uL — AB (ref 150–400)
RBC: 4.27 MIL/uL (ref 4.22–5.81)
RDW: 15.6 % — ABNORMAL HIGH (ref 11.5–15.5)
WBC: 6 10*3/uL (ref 4.0–10.5)

## 2017-03-19 LAB — URINALYSIS, ROUTINE W REFLEX MICROSCOPIC
BILIRUBIN URINE: NEGATIVE
Bacteria, UA: NONE SEEN
Hgb urine dipstick: NEGATIVE
KETONES UR: 20 mg/dL — AB
Leukocytes, UA: NEGATIVE
Nitrite: NEGATIVE
PH: 5 (ref 5.0–8.0)
PROTEIN: NEGATIVE mg/dL
SQUAMOUS EPITHELIAL / LPF: NONE SEEN
Specific Gravity, Urine: 1.029 (ref 1.005–1.030)

## 2017-03-19 LAB — CBG MONITORING, ED: Glucose-Capillary: 471 mg/dL — ABNORMAL HIGH (ref 65–99)

## 2017-03-19 MED ORDER — INSULIN ASPART 100 UNIT/ML ~~LOC~~ SOLN
10.0000 [IU] | Freq: Once | SUBCUTANEOUS | Status: AC
Start: 2017-03-19 — End: 2017-03-19
  Administered 2017-03-19: 10 [IU] via SUBCUTANEOUS
  Filled 2017-03-19: qty 1

## 2017-03-19 NOTE — ED Notes (Signed)
Pt had drawn for labs:  Blue Gold Lt green Lavender Dark green

## 2017-03-19 NOTE — ED Provider Notes (Signed)
[facility] DEPT Provider Note   CSN: [id number] Arrival date & time: 03/19/17  1056     History   Chief Complaint Chief Complaint  Patient presents with  . Hyperglycemia    HPI Mike [name] is a 57 y.o. male with past medical history of type 2 diabetes who presents for evaluation of hyperglycemia who presents for evaluation. Patient reports that he has been taking his insulin and states that his last dose was this morning.  Patient reports that he has had some episodes of vomiting but denies any presence of blood.  He cannot quantify how much vomiting he has had or when this vomiting occurred.  He also reports some diffuse abdominal pain but no focal point. He does not specificy when this abdominal pain occurred. He denies any alleviating or aggravating factors. Patient reports that he came because his blood sugars have been running "high."  He states he has been compliant with his medication but does note a history of noncompliance.  Patient denies any fevers, chest pain, difficulty breathing, dysuria, hematuria, diarrhea, numbness/weakness of his arms or legs.  The history is provided by the patient.    Past Medical History:  Diagnosis Date  . Anxiety   . Anxiety   . Chronic lower back pain   . Depression   . DKA (diabetic ketoacidoses) ([place]) 07/30/2016  . Hyperlipemia   . Hypertension   . Migraine    "last one was ~ 4 yr ago" (12/23/2014)  . Seizures ([place])    "related to pills for anxiety; if I don't take the pills I'm suppose to take I'll have them" (12/23/2014)  . Type II diabetes mellitus [facility])     Patient Active Problem List   Diagnosis Date Noted  . AKI (acute kidney injury) ([place]) 03/04/2017  . Malnutrition of moderate degree 02/08/2017  . DKA (diabetic ketoacidoses) ([place]) 02/07/2017  . History of stroke 02/07/2017  . Hyperphosphatemia 12/13/2016  . Evaluation by psychiatric service required 11/30/2016  . Lactic acidosis 11/18/2016   . Normocytic anemia 11/06/2016  . HLD (hyperlipidemia) 11/06/2016  . Adjustment disorder with other symptoms 11/05/2016  . Uncontrolled type 2 diabetes mellitus with hyperosmolar nonketotic hyperglycemia ([place]) 08/24/2016  . Essential hypertension 08/24/2016  . Acute ischemic stroke ([place])   . Protein-calorie malnutrition, severe 12/24/2014  . Homelessness 12/23/2014  . Hypertensive urgency 12/23/2014  . DM (diabetes mellitus) ([place]) 12/23/2014  . Intermittent palpitations 12/23/2014  . Tobacco abuse 12/23/2014  . Pleuritic chest pain 12/23/2014  . Abnormal EKG 12/23/2014  . Type II diabetes mellitus with renal manifestations [facility])     Past Surgical History:  Procedure Laterality Date  . NO PAST SURGERIES         Home Medications    Prior to Admission medications   Medication Sig Start Date End Date Taking? Authorizing Provider  aspirin 325 MG tablet Take 1 tablet (325 mg total) by mouth daily. 03/08/17  Yes Georgette Shell, MD  atorvastatin (LIPITOR) 40 MG tablet Take 1 tablet (40 mg total) by mouth daily. 01/26/17  Yes Cardama, Grayce Sessions, MD  blood glucose meter kit and supplies KIT Dispense based on patient and insurance preference. Use up to four times daily as directed. (FOR ICD-9 250.00, 250.01). 11/22/16  Yes Velvet Bathe, MD  ferrous sulfate 325 (65 FE) MG tablet Take 1 tablet (325 mg total) daily by mouth. Patient taking differently: Take 325 mg by mouth daily as needed (low iron).  11/23/16  Yes Ward, Cyril Mourning  N, DO  insulin aspart protamine- aspart (NOVOLOG MIX 70/30) (70-30) 100 UNIT/ML injection Inject 0.3 mLs (30 Units total) into the skin 2 (two) times daily with a meal. 02/13/17  Yes Charlynne Cousins, MD  lisinopril (PRINIVIL,ZESTRIL) 40 MG tablet Take 1 tablet (40 mg total) by mouth daily. 01/26/17  Yes Cardama, Grayce Sessions, MD  metoprolol tartrate (LOPRESSOR) 25 MG tablet Take 0.5 tablets (12.5 mg total) by mouth 2 (two) times daily. 03/07/17  Yes Georgette Shell, MD    Family History Family History  Problem Relation Age of Onset  . Diabetes Mellitus II Mother   . Diabetes Mellitus II Father     Social History Social History   Tobacco Use  . Smoking status: Current Every Day Smoker    Packs/day: 0.10    Years: 40.00    Pack years: 4.00    Types: Cigarettes  . Smokeless tobacco: Never Used  Substance Use Topics  . Alcohol use: Yes    Alcohol/week: 1.2 oz    Types: 2 Cans of beer per week  . Drug use: No    Comment: 12/23/2014 "stopped ~ 10 yrs ago"     Allergies   Ibuprofen and Tylenol [acetaminophen]   Review of Systems Review of Systems  Constitutional: Negative for chills and fever.  HENT: Negative for congestion.   Eyes: Negative for visual disturbance.  Respiratory: Negative for cough and shortness of breath.   Cardiovascular: Negative for chest pain.  Gastrointestinal: Positive for abdominal pain and vomiting. Negative for diarrhea and nausea.  Genitourinary: Negative for dysuria and hematuria.  Musculoskeletal: Negative for back pain and neck pain.  Skin: Negative for rash.  Neurological: Negative for dizziness, weakness, numbness and headaches.  Psychiatric/Behavioral: Negative for confusion.  All other systems reviewed and are negative.    Physical Exam Updated Vital Signs BP (!) 169/89 (BP Location: Right Arm)   Pulse 88   Resp 17   SpO2 100%   Physical Exam  Constitutional: He is oriented to person, place, and time. He appears well-developed and well-nourished.  Sitting comfortably on examination table  HENT:  Head: Normocephalic and atraumatic.  Mouth/Throat: Oropharynx is clear and moist and mucous membranes are normal.  Eyes: Conjunctivae, EOM and lids are normal. Pupils are equal, round, and reactive to light.  Neck: Full passive range of motion without pain.  Cardiovascular: Normal rate, regular rhythm, normal heart sounds and normal pulses. Exam reveals no gallop and no friction rub.    No murmur heard. Pulmonary/Chest: Effort normal and breath sounds normal.  Abdominal: Soft. Normal appearance. There is no tenderness. There is no rigidity and no guarding.  Abdomen is soft, non-distended, non-tender.   Musculoskeletal: Normal range of motion.  Neurological: He is alert and oriented to person, place, and time.  Skin: Skin is warm and dry. Capillary refill takes less than 2 seconds.  Psychiatric: He has a normal mood and affect. His speech is normal.  Nursing note and vitals reviewed.    ED Treatments / Results  Labs (all labs ordered are listed, but only abnormal results are displayed) Labs Reviewed  BASIC METABOLIC PANEL - Abnormal; Notable for the following components:      Result Value   Chloride 96 (*)    Glucose, Bld 693 (*)    Anion gap 16 (*)    All other components within normal limits  CBC - Abnormal; Notable for the following components:   Hemoglobin 10.7 (*)    HCT 35.7 (*)  MCH 25.1 (*)    RDW 15.6 (*)    Platelets 497 (*)    All other components within normal limits  URINALYSIS, ROUTINE W REFLEX MICROSCOPIC - Abnormal; Notable for the following components:   Glucose, UA >=500 (*)    Ketones, ur 20 (*)    All other components within normal limits  CBG MONITORING, ED - Abnormal; Notable for the following components:   Glucose-Capillary >600 (*)    All other components within normal limits  CBG MONITORING, ED - Abnormal; Notable for the following components:   Glucose-Capillary 471 (*)    All other components within normal limits    EKG  EKG Interpretation None       Radiology No results found.  Procedures Procedures (including critical care time)  Medications Ordered in ED Medications  insulin aspart (novoLOG) injection 10 Units (10 Units Subcutaneous Given 03/19/17 1316)     Initial Impression / Assessment and Plan / ED Course  I have reviewed the triage vital signs and the nursing notes.  Pertinent labs & imaging results  that were available during my care of the patient were reviewed by me and considered in my medical decision making (see chart for details).     57 year old male past medical history of diabetes who presents to the emergency department for evaluation of hyperglycemia.  Patient is well-known to the emergency department and was seen yesterday for evaluation of same symptoms.  Patient states that he did take his insulin but has a history of noncompliance.  Reports some vomiting but with no blood.  Also reports some diffuse abdominal pain but fine when this started. Patient is afebrile, non-toxic appearing, sitting comfortably on examination table. Vital signs reviewed and stable. Patient is hypertensive. Per review of records, patient has a baseline history of HTN. He has not taken his meds today. He denies any CP, SOB, HA, Vision changes, nausea/vomiting. On exam, he has not abdominal tenderness. No rigidity, guarding noted. Exam is not concerning for peritonitis, perforation.  Plan to check basic labs and eval for DKA, though low suspicion given history/physical exam. Will give fluids.   BMP shows BG of 693. Chloride 96. Bicarb is 26. BUN/Cr normal. Anion gap is 16. Patient does not appear in DKA. CBC shows stable Hgb/Hct. UA shows small amount of ketones but otherwise unremarkable. Patient reports that he took his insulin today though he has a history of noncompliance, and given today's sugars, questionable. Will plan to give Insulin in the department.    Repeat CBG is 471 after SQ insulin. Repeat abdominal exam is benign. No rigidity, guarding. Patient is tolerating PO in the department without any difficulty. No vomiting in the ED. Discussed patient with Dr. Billy Fischer. Patient is stable for discharge. Patient instructed to take insulin as directed Patient had ample opportunity for questions and discussion. All patient's questions were answered with full understanding. Strict return precautions discussed.  Patient expresses understanding and agreement to plan.    Final Clinical Impressions(s) / ED Diagnoses   Final diagnoses:  Hyperglycemia    ED Discharge Orders    None       Desma Mcgregor 03/19/17 Dena Billet, MD 03/19/17 [phone]

## 2017-03-19 NOTE — ED Notes (Signed)
NT gave pt a urinal and asked pt to please not urinate in the floor.

## 2017-03-19 NOTE — ED Notes (Signed)
Pt continuously asking for food and soda. Pt informed multiple times he cannot have anything as his sugar is very high

## 2017-03-19 NOTE — Discharge Instructions (Addendum)
Take insulin as directed.   Make sure you are eating and drinking plenty of fluids.   Return to the ED for any worsening pain, fever, chest pain, abdominal pain, difficulty breathing or any other worsening or concerning symptoms.

## 2017-03-19 NOTE — ED Notes (Signed)
Date and time results received: 03/19/17 1:29 PM    Test:glucose  Critical Value: 693  Name of Provider Notified: PA Lyndsey  Orders Received? Or Actions Taken?: PA Lyndsey, waiting for further orders

## 2017-03-19 NOTE — ED Notes (Signed)
Pt instructed x2 to get dressed. Discharge paperwork reviewed with pt and pt instructed to get dressed so he can be discharged. Pt given another cup of water upon request

## 2017-03-19 NOTE — ED Notes (Signed)
Pt denies questions on d/c instructions at this time.

## 2017-03-19 NOTE — ED Triage Notes (Signed)
Per EMS-states elevated blood sugar and BP-seen here for same symptoms on 3/2-states he doesn't get his insulin-states he is homeless-noncompliant with his diabetes and HTN

## 2017-03-19 NOTE — ED Notes (Signed)
Pt instructed again to get dressed so he can be discharged and leave.  Pt has not attempted to get dressed.  Security called

## 2017-03-23 ENCOUNTER — Encounter (HOSPITAL_COMMUNITY): Payer: Self-pay

## 2017-03-23 ENCOUNTER — Emergency Department (HOSPITAL_COMMUNITY)
Admission: EM | Admit: 2017-03-23 | Discharge: 2017-03-23 | Disposition: A | Discharge status: Home / Self Care | Payer: Self-pay | Attending: Emergency Medicine | Admitting: Emergency Medicine

## 2017-03-23 ENCOUNTER — Emergency Department (HOSPITAL_COMMUNITY)
Admission: EM | Admit: 2017-03-23 | Discharge: 2017-03-24 | Disposition: A | Discharge status: Home / Self Care | Payer: Self-pay | Attending: Emergency Medicine | Admitting: Emergency Medicine

## 2017-03-23 ENCOUNTER — Other Ambulatory Visit: Payer: Self-pay

## 2017-03-23 DIAGNOSIS — I10 Essential (primary) hypertension: Secondary | ICD-10-CM | POA: Insufficient documentation

## 2017-03-23 DIAGNOSIS — E119 Type 2 diabetes mellitus without complications: Secondary | ICD-10-CM | POA: Insufficient documentation

## 2017-03-23 DIAGNOSIS — F17210 Nicotine dependence, cigarettes, uncomplicated: Secondary | ICD-10-CM | POA: Insufficient documentation

## 2017-03-23 DIAGNOSIS — Z794 Long term (current) use of insulin: Secondary | ICD-10-CM | POA: Insufficient documentation

## 2017-03-23 DIAGNOSIS — Z7982 Long term (current) use of aspirin: Secondary | ICD-10-CM | POA: Insufficient documentation

## 2017-03-23 DIAGNOSIS — R739 Hyperglycemia, unspecified: Secondary | ICD-10-CM | POA: Insufficient documentation

## 2017-03-23 DIAGNOSIS — Z79899 Other long term (current) drug therapy: Secondary | ICD-10-CM | POA: Insufficient documentation

## 2017-03-23 DIAGNOSIS — E1165 Type 2 diabetes mellitus with hyperglycemia: Secondary | ICD-10-CM | POA: Insufficient documentation

## 2017-03-23 LAB — URINALYSIS, ROUTINE W REFLEX MICROSCOPIC
BACTERIA UA: NONE SEEN
BILIRUBIN URINE: NEGATIVE
Bacteria, UA: NONE SEEN
Bilirubin Urine: NEGATIVE
Glucose, UA: >=500 mg/dL — AB
Glucose, UA: >=500 mg/dL — AB
KETONES UR: NEGATIVE mg/dL
KETONES UR: 5 mg/dL — AB
LEUKOCYTES UA: NEGATIVE
LEUKOCYTES UA: NEGATIVE
NITRITE: NEGATIVE
Nitrite: NEGATIVE
PH: 5 (ref 5.0–8.0)
PH: 7 (ref 5.0–8.0)
Protein, ur: NEGATIVE mg/dL
Protein, ur: NEGATIVE mg/dL
SPECIFIC GRAVITY, URINE: 1.027 (ref 1.005–1.030)
SQUAMOUS EPITHELIAL / LPF: NONE SEEN
Specific Gravity, Urine: 1.031 — ABNORMAL HIGH (ref 1.005–1.030)

## 2017-03-23 LAB — BASIC METABOLIC PANEL
Anion gap: 14 (ref 5–15)
Anion gap: 12 (ref 5–15)
BUN: 7 mg/dL (ref 6–20)
BUN: 13 mg/dL (ref 6–20)
CHLORIDE: 100 mmol/L — AB (ref 101–111)
CO2: 24 mmol/L (ref 22–32)
CO2: 27 mmol/L (ref 22–32)
CREATININE: 0.94 mg/dL (ref 0.61–1.24)
Calcium: 9.1 mg/dL (ref 8.9–10.3)
Calcium: 9.3 mg/dL (ref 8.9–10.3)
Chloride: 96 mmol/L — ABNORMAL LOW (ref 101–111)
Creatinine, Ser: 0.95 mg/dL (ref 0.61–1.24)
GFR calc Af Amer: >60 mL/min (ref >60)
GFR calc Af Amer: >60 mL/min (ref >60)
GFR calc non Af Amer: >60 mL/min (ref >60)
GLUCOSE: 508 mg/dL — AB (ref 65–99)
Glucose, Bld: 500 mg/dL — ABNORMAL HIGH (ref 65–99)
POTASSIUM: 4 mmol/L (ref 3.5–5.1)
POTASSIUM: 4.4 mmol/L (ref 3.5–5.1)
SODIUM: 134 mmol/L — AB (ref 135–145)
Sodium: 139 mmol/L (ref 135–145)

## 2017-03-23 LAB — CBG MONITORING, ED
GLUCOSE-CAPILLARY: 515 mg/dL — AB (ref 65–99)
GLUCOSE-CAPILLARY: 484 mg/dL — AB (ref 65–99)
Glucose-Capillary: 408 mg/dL — ABNORMAL HIGH (ref 65–99)
Glucose-Capillary: 344 mg/dL — ABNORMAL HIGH (ref 65–99)

## 2017-03-23 LAB — CBC
HCT: 29.9 % — ABNORMAL LOW (ref 39.0–52.0)
HCT: 29.9 % — ABNORMAL LOW (ref 39.0–52.0)
Hemoglobin: 9.3 g/dL — ABNORMAL LOW (ref 13.0–17.0)
Hemoglobin: 9.3 g/dL — ABNORMAL LOW (ref 13.0–17.0)
MCH: 24.9 pg — ABNORMAL LOW (ref 26.0–34.0)
MCH: 25.3 pg — ABNORMAL LOW (ref 26.0–34.0)
MCHC: 31.1 g/dL (ref 30.0–36.0)
MCHC: 31.1 g/dL (ref 30.0–36.0)
MCV: 80.2 fL (ref 78.0–100.0)
MCV: 81.3 fL (ref 78.0–100.0)
PLATELETS: 342 10*3/uL (ref 150–400)
Platelets: 362 10*3/uL (ref 150–400)
RBC: 3.73 MIL/uL — ABNORMAL LOW (ref 4.22–5.81)
RBC: 3.68 MIL/uL — ABNORMAL LOW (ref 4.22–5.81)
RDW: 15.3 % (ref 11.5–15.5)
RDW: 15.6 % — AB (ref 11.5–15.5)
WBC: 8.2 10*3/uL (ref 4.0–10.5)
WBC: 6.3 10*3/uL (ref 4.0–10.5)

## 2017-03-23 MED ORDER — INSULIN ASPART 100 UNIT/ML ~~LOC~~ SOLN
8.0000 [IU] | Freq: Once | SUBCUTANEOUS | Status: AC
  Administered 2017-03-23: 8 [IU] via INTRAVENOUS
  Filled 2017-03-23: qty 1

## 2017-03-23 MED ORDER — SODIUM CHLORIDE 0.9 % IV BOLUS (SEPSIS)
1000.0000 mL | Freq: Once | INTRAVENOUS | Status: AC
  Administered 2017-03-23: 1000 mL via INTRAVENOUS

## 2017-03-23 MED ORDER — INSULIN ASPART 100 UNIT/ML ~~LOC~~ SOLN
10.0000 [IU] | Freq: Once | SUBCUTANEOUS | Status: AC
  Administered 2017-03-23: 10 [IU] via INTRAVENOUS
  Filled 2017-03-23: qty 1

## 2017-03-23 NOTE — ED Triage Notes (Signed)
Pt arrives vis EMS with c/o hyperglycemia; pt arrives hypertensive; Sx started around 5pm; pt denies pain on arrival. EMS states meter unable to register CBG; Pt a&ox 4 on arrival.-Monique,RN

## 2017-03-23 NOTE — Discharge Instructions (Signed)
Continue your diabetes medicines as previously prescribed.  Follow-up with your primary doctor in the next week.

## 2017-03-23 NOTE — ED Triage Notes (Signed)
Patient arrives by EMS for hyperglycemia, hypertension and blurred vision since 03-22-17 in the am.  EMS called after patient ate chicken sandwich at [employer].  A&Ox4.  CBG HI for EMS, gave 200 cc IV fluids.  CBG 515 here.

## 2017-03-23 NOTE — ED Provider Notes (Signed)
[facility] EMERGENCY DEPARTMENT Provider Note   CSN: [id number] Arrival date & time: 03/23/17  0222     History   Chief Complaint Chief Complaint  Patient presents with  . Hyperglycemia  . Hypertension    HPI Mike [name] is a 57 y.o. male.  This patient is a 57 year old male well-known to the emergency department for frequent visits regarding noncompliant diabetes, elevated blood sugar, and other miscellaneous complaints.  He is Artie been here twice this week complaining of high blood sugar.  He apparently ate a sandwich at a [employer], then for some reason that the patient cannot elaborate an ambulance was called.  He tells me he is here for elevated blood pressure.  He tells me that he checked his blood pressure yesterday morning and it was high so came to be seen nearly 24 hours later.   The history is provided by the patient.  Hyperglycemia  Onset quality:  Sudden Timing:  Constant Progression:  Unchanged Current diabetic treatments: Noncompliant. Relieved by:  Nothing Ineffective treatments:  None tried Associated symptoms: no abdominal pain and no fever   Hypertension  Pertinent negatives include no abdominal pain.    Past Medical History:  Diagnosis Date  . Anxiety   . Anxiety   . Chronic lower back pain   . Depression   . DKA (diabetic ketoacidoses) ([place]) 07/30/2016  . Hyperlipemia   . Hypertension   . Migraine    "last one was ~ 4 yr ago" (12/23/2014)  . Seizures ([place])    "related to pills for anxiety; if I don't take the pills I'm suppose to take I'll have them" (12/23/2014)  . Type II diabetes mellitus [facility])     Patient Active Problem List   Diagnosis Date Noted  . AKI (acute kidney injury) ([place]) 03/04/2017  . Malnutrition of moderate degree 02/08/2017  . DKA (diabetic ketoacidoses) ([place]) 02/07/2017  . History of stroke 02/07/2017  . Hyperphosphatemia 12/13/2016  . Evaluation by psychiatric service required  11/30/2016  . Lactic acidosis 11/18/2016  . Normocytic anemia 11/06/2016  . HLD (hyperlipidemia) 11/06/2016  . Adjustment disorder with other symptoms 11/05/2016  . Uncontrolled type 2 diabetes mellitus with hyperosmolar nonketotic hyperglycemia ([place]) 08/24/2016  . Essential hypertension 08/24/2016  . Acute ischemic stroke ([place])   . Protein-calorie malnutrition, severe 12/24/2014  . Homelessness 12/23/2014  . Hypertensive urgency 12/23/2014  . DM (diabetes mellitus) ([place]) 12/23/2014  . Intermittent palpitations 12/23/2014  . Tobacco abuse 12/23/2014  . Pleuritic chest pain 12/23/2014  . Abnormal EKG 12/23/2014  . Type II diabetes mellitus with renal manifestations [facility])     Past Surgical History:  Procedure Laterality Date  . NO PAST SURGERIES         Home Medications    Prior to Admission medications   Medication Sig Start Date End Date Taking? Authorizing Provider  aspirin 325 MG tablet Take 1 tablet (325 mg total) by mouth daily. 03/08/17   Georgette Shell, MD  atorvastatin (LIPITOR) 40 MG tablet Take 1 tablet (40 mg total) by mouth daily. 01/26/17   Cardama, Grayce Sessions, MD  blood glucose meter kit and supplies KIT Dispense based on patient and insurance preference. Use up to four times daily as directed. (FOR ICD-9 250.00, 250.01). 11/22/16   Velvet Bathe, MD  ferrous sulfate 325 (65 FE) MG tablet Take 1 tablet (325 mg total) daily by mouth. Patient taking differently: Take 325 mg by mouth daily as needed (low iron).  11/23/16  Ward, Delice Bison, DO  insulin aspart protamine- aspart (NOVOLOG MIX 70/30) (70-30) 100 UNIT/ML injection Inject 0.3 mLs (30 Units total) into the skin 2 (two) times daily with a meal. 02/13/17   Charlynne Cousins, MD  lisinopril (PRINIVIL,ZESTRIL) 40 MG tablet Take 1 tablet (40 mg total) by mouth daily. 01/26/17   Fatima Blank, MD  metoprolol tartrate (LOPRESSOR) 25 MG tablet Take 0.5 tablets (12.5 mg total) by mouth 2 (two) times  daily. 03/07/17   Georgette Shell, MD    Family History Family History  Problem Relation Age of Onset  . Diabetes Mellitus II Mother   . Diabetes Mellitus II Father     Social History Social History   Tobacco Use  . Smoking status: Current Every Day Smoker    Packs/day: 0.10    Years: 40.00    Pack years: 4.00    Types: Cigarettes  . Smokeless tobacco: Never Used  Substance Use Topics  . Alcohol use: Yes    Alcohol/week: 1.2 oz    Types: 2 Cans of beer per week  . Drug use: No    Comment: 12/23/2014 "stopped ~ 10 yrs ago"     Allergies   Ibuprofen and Tylenol [acetaminophen]   Review of Systems Review of Systems  Constitutional: Negative for fever.  Gastrointestinal: Negative for abdominal pain.  All other systems reviewed and are negative.    Physical Exam Updated Vital Signs BP (!) 156/74 (BP Location: Left Arm)   Pulse 88   Temp 98.5 F (36.9 C) (Oral)   Resp 18   Ht _0  (1.93 m)   Wt 77.1 kg (170 lb)   SpO2 99%   BMI 20.69 kg/m   Physical Exam  Constitutional: He is oriented to person, place, and time. He appears well-developed and well-nourished. No distress.  HENT:  Head: Normocephalic and atraumatic.  Mouth/Throat: Oropharynx is clear and moist.  Neck: Normal range of motion. Neck supple.  Cardiovascular: Normal rate and regular rhythm. Exam reveals no friction rub.  No murmur heard. Pulmonary/Chest: Effort normal and breath sounds normal. No respiratory distress. He has no wheezes. He has no rales.  Abdominal: Soft. Bowel sounds are normal. He exhibits no distension. There is no tenderness.  Musculoskeletal: Normal range of motion. He exhibits no edema.  Neurological: He is alert and oriented to person, place, and time. Coordination normal.  Skin: Skin is warm and dry. He is not diaphoretic.  Nursing note and vitals reviewed.    ED Treatments / Results  Labs (all labs ordered are listed, but only abnormal results are  displayed) Labs Reviewed  BASIC METABOLIC PANEL - Abnormal; Notable for the following components:      Result Value   Sodium 134 (*)    Chloride 96 (*)    All other components within normal limits  CBC - Abnormal; Notable for the following components:   RBC 3.73 (*)    Hemoglobin 9.3 (*)    HCT 29.9 (*)    MCH 24.9 (*)    All other components within normal limits  URINALYSIS, ROUTINE W REFLEX MICROSCOPIC - Abnormal; Notable for the following components:   Specific Gravity, Urine 1.031 (*)    Glucose, UA >=500 (*)    Hgb urine dipstick SMALL (*)    Squamous Epithelial / LPF 0-5 (*)    All other components within normal limits  CBG MONITORING, ED - Abnormal; Notable for the following components:   Glucose-Capillary 515 (*)  All other components within normal limits    EKG  EKG Interpretation None       Radiology No results found.  Procedures Procedures (including critical care time)  Medications Ordered in ED Medications  insulin aspart (novoLOG) injection 8 Units (not administered)     Initial Impression / Assessment and Plan / ED Course  I have reviewed the triage vital signs and the nursing notes.  Pertinent labs & imaging results that were available during my care of the patient were reviewed by me and considered in my medical decision making (see chart for details).  Patient with noncompliant diabetes brought by EMS for evaluation of elevated blood sugar and elevated blood pressure.  He is basically normotensive here in the ER and blood sugar has improved with fluids and insulin.  There is no sign of DKA and I believe he is appropriate for discharge.  Final Clinical Impressions(s) / ED Diagnoses   Final diagnoses:  None    ED Discharge Orders    None       Veryl Speak, MD 03/23/17 [phone]

## 2017-03-23 NOTE — ED Notes (Signed)
Pt arrives with restricted arm band on right arm but does not know why right arm would be restricted; No noted hx of Kidney failure of dialysis-Monique,RN

## 2017-03-24 ENCOUNTER — Encounter (HOSPITAL_COMMUNITY): Payer: Self-pay | Admitting: Emergency Medicine

## 2017-03-24 ENCOUNTER — Emergency Department (HOSPITAL_COMMUNITY)
Admission: EM | Admit: 2017-03-24 | Discharge: 2017-03-24 | Disposition: A | Discharge status: Home / Self Care | Payer: Self-pay | Attending: Emergency Medicine | Admitting: Emergency Medicine

## 2017-03-24 DIAGNOSIS — F17210 Nicotine dependence, cigarettes, uncomplicated: Secondary | ICD-10-CM | POA: Insufficient documentation

## 2017-03-24 DIAGNOSIS — Z590 Homelessness: Secondary | ICD-10-CM | POA: Insufficient documentation

## 2017-03-24 DIAGNOSIS — Z7982 Long term (current) use of aspirin: Secondary | ICD-10-CM | POA: Insufficient documentation

## 2017-03-24 DIAGNOSIS — Z794 Long term (current) use of insulin: Secondary | ICD-10-CM | POA: Insufficient documentation

## 2017-03-24 DIAGNOSIS — Z79899 Other long term (current) drug therapy: Secondary | ICD-10-CM | POA: Insufficient documentation

## 2017-03-24 DIAGNOSIS — E1165 Type 2 diabetes mellitus with hyperglycemia: Secondary | ICD-10-CM | POA: Insufficient documentation

## 2017-03-24 DIAGNOSIS — I10 Essential (primary) hypertension: Secondary | ICD-10-CM | POA: Insufficient documentation

## 2017-03-24 DIAGNOSIS — R739 Hyperglycemia, unspecified: Secondary | ICD-10-CM

## 2017-03-24 LAB — I-STAT CHEM 8, ED
BUN: 14 mg/dL (ref 6–20)
CHLORIDE: 98 mmol/L — AB (ref 101–111)
Calcium, Ion: 1.22 mmol/L (ref 1.15–1.40)
Creatinine, Ser: 0.7 mg/dL (ref 0.61–1.24)
Glucose, Bld: 642 mg/dL (ref 65–99)
HCT: 30 % — ABNORMAL LOW (ref 39.0–52.0)
Hemoglobin: 10.2 g/dL — ABNORMAL LOW (ref 13.0–17.0)
Potassium: 4.2 mmol/L (ref 3.5–5.1)
SODIUM: 137 mmol/L (ref 135–145)
TCO2: 32 mmol/L (ref 22–32)

## 2017-03-24 LAB — CBG MONITORING, ED
Glucose-Capillary: 332 mg/dL — ABNORMAL HIGH (ref 65–99)
Glucose-Capillary: >600 mg/dL (ref 65–99)

## 2017-03-24 MED ORDER — SODIUM CHLORIDE 0.9 % IV BOLUS (SEPSIS)
1000.0000 mL | Freq: Once | INTRAVENOUS | Status: AC
  Administered 2017-03-24: 1000 mL via INTRAVENOUS

## 2017-03-24 MED ORDER — INSULIN ASPART PROT & ASPART (70-30 MIX) 100 UNIT/ML ~~LOC~~ SUSP
30.0000 [IU] | Freq: Two times a day (BID) | SUBCUTANEOUS | Status: DC
  Administered 2017-03-24: 30 [IU] via SUBCUTANEOUS
  Filled 2017-03-24: qty 10

## 2017-03-24 NOTE — ED Provider Notes (Signed)
[facility] EMERGENCY DEPARTMENT Provider Note   CSN: [id number] Arrival date & time: 03/23/17  2138     History   Chief Complaint Chief Complaint  Patient presents with  . Hyperglycemia    HPI Mike [name] is a 57 y.o. male.  HPI   57 year old male with a history of hypertension, hyperlipidemia, diabetes, frequent emergency department visits with 43 visits in the last 6 months, with frequent concern for hyperglycemia in the setting of noncompliance with his diabetes medications, presents with concern for hyperglycemia.  Patient reports to me that his sugars have been running high.  He is unable to say exactly how high they have been.  He was seen last night for the same thing, and discharged.  He reports he has had nausea and vomiting, although he is unable to quantify how much.  He denies abdominal pain and fevers. He is asking for diet coke. He is a poor historian. EMS reports his glucose was undetectable.   Past Medical History:  Diagnosis Date  . Anxiety   . Anxiety   . Chronic lower back pain   . Depression   . DKA (diabetic ketoacidoses) ([place]) 07/30/2016  . Hyperlipemia   . Hypertension   . Migraine    "last one was ~ 4 yr ago" (12/23/2014)  . Seizures ([place])    "related to pills for anxiety; if I don't take the pills I'm suppose to take I'll have them" (12/23/2014)  . Type II diabetes mellitus [facility])     Patient Active Problem List   Diagnosis Date Noted  . AKI (acute kidney injury) ([place]) 03/04/2017  . Malnutrition of moderate degree 02/08/2017  . DKA (diabetic ketoacidoses) ([place]) 02/07/2017  . History of stroke 02/07/2017  . Hyperphosphatemia 12/13/2016  . Evaluation by psychiatric service required 11/30/2016  . Lactic acidosis 11/18/2016  . Normocytic anemia 11/06/2016  . HLD (hyperlipidemia) 11/06/2016  . Adjustment disorder with other symptoms 11/05/2016  . Uncontrolled type 2 diabetes mellitus with hyperosmolar nonketotic hyperglycemia  ([place]) 08/24/2016  . Essential hypertension 08/24/2016  . Acute ischemic stroke ([place])   . Protein-calorie malnutrition, severe 12/24/2014  . Homelessness 12/23/2014  . Hypertensive urgency 12/23/2014  . DM (diabetes mellitus) ([place]) 12/23/2014  . Intermittent palpitations 12/23/2014  . Tobacco abuse 12/23/2014  . Pleuritic chest pain 12/23/2014  . Abnormal EKG 12/23/2014  . Type II diabetes mellitus with renal manifestations [facility])     Past Surgical History:  Procedure Laterality Date  . NO PAST SURGERIES         Home Medications    Prior to Admission medications   Medication Sig Start Date End Date Taking? Authorizing Provider  aspirin 325 MG tablet Take 1 tablet (325 mg total) by mouth daily. 03/08/17  Yes Georgette Shell, MD  atorvastatin (LIPITOR) 40 MG tablet Take 1 tablet (40 mg total) by mouth daily. 01/26/17  Yes Cardama, Grayce Sessions, MD  ferrous sulfate 325 (65 FE) MG tablet Take 1 tablet (325 mg total) daily by mouth. Patient taking differently: Take 325 mg by mouth daily as needed (low iron).  11/23/16  Yes Ward, Cyril Mourning N, DO  insulin aspart protamine- aspart (NOVOLOG MIX 70/30) (70-30) 100 UNIT/ML injection Inject 0.3 mLs (30 Units total) into the skin 2 (two) times daily with a meal. 02/13/17  Yes Charlynne Cousins, MD  lisinopril (PRINIVIL,ZESTRIL) 40 MG tablet Take 1 tablet (40 mg total) by mouth daily. 01/26/17  Yes Cardama, Grayce Sessions, MD  metoprolol tartrate (LOPRESSOR) 25  MG tablet Take 0.5 tablets (12.5 mg total) by mouth 2 (two) times daily. 03/07/17  Yes Georgette Shell, MD  blood glucose meter kit and supplies KIT Dispense based on patient and insurance preference. Use up to four times daily as directed. (FOR ICD-9 250.00, 250.01). 11/22/16   Velvet Bathe, MD    Family History Family History  Problem Relation Age of Onset  . Diabetes Mellitus II Mother   . Diabetes Mellitus II Father     Social History Social History   Tobacco Use  .  Smoking status: Current Every Day Smoker    Packs/day: 0.10    Years: 40.00    Pack years: 4.00    Types: Cigarettes  . Smokeless tobacco: Never Used  Substance Use Topics  . Alcohol use: Yes    Alcohol/week: 1.2 oz    Types: 2 Cans of beer per week  . Drug use: No    Comment: 12/23/2014 "stopped ~ 10 yrs ago"     Allergies   Ibuprofen and Tylenol [acetaminophen]   Review of Systems Review of Systems  Constitutional: Negative for fever.  HENT: Negative for sore throat.   Eyes: Negative for visual disturbance.  Respiratory: Negative for shortness of breath.   Cardiovascular: Negative for chest pain.  Gastrointestinal: Positive for nausea and vomiting. Negative for abdominal pain.  Genitourinary: Negative for difficulty urinating.  Skin: Negative for rash.  Neurological: Negative for syncope and headaches.     Physical Exam Updated Vital Signs BP (!) 174/97   Pulse 74   Temp 98.5 F (36.9 C) (Oral)   Resp 12   SpO2 95%   Physical Exam  Constitutional: He is oriented to person, place, and time. He appears well-developed and well-nourished. No distress.  Appears tired  HENT:  Head: Normocephalic and atraumatic.  Eyes: Conjunctivae and EOM are normal.  Neck: Normal range of motion.  Cardiovascular: Normal rate, regular rhythm, normal heart sounds and intact distal pulses. Exam reveals no gallop and no friction rub.  No murmur heard. Pulmonary/Chest: Effort normal and breath sounds normal. No respiratory distress. He has no wheezes. He has no rales.  Abdominal: Soft. He exhibits no distension. There is no tenderness. There is no guarding.  Musculoskeletal: He exhibits no edema.  Neurological: He is alert and oriented to person, place, and time.  Normal upper and lower extremity strength  Skin: Skin is warm and dry. He is not diaphoretic.  Nursing note and vitals reviewed.    ED Treatments / Results  Labs (all labs ordered are listed, but only abnormal results  are displayed) Labs Reviewed  BASIC METABOLIC PANEL - Abnormal; Notable for the following components:      Result Value   Chloride 100 (*)    Glucose, Bld 508 (*)    All other components within normal limits  CBC - Abnormal; Notable for the following components:   RBC 3.68 (*)    Hemoglobin 9.3 (*)    HCT 29.9 (*)    MCH 25.3 (*)    RDW 15.6 (*)    All other components within normal limits  URINALYSIS, ROUTINE W REFLEX MICROSCOPIC - Abnormal; Notable for the following components:   Color, Urine STRAW (*)    Glucose, UA >=500 (*)    Hgb urine dipstick SMALL (*)    Ketones, ur 5 (*)    All other components within normal limits  CBG MONITORING, ED - Abnormal; Notable for the following components:   Glucose-Capillary 484 (*)  All other components within normal limits  CBG MONITORING, ED - Abnormal; Notable for the following components:   Glucose-Capillary 332 (*)    All other components within normal limits    EKG  EKG Interpretation None       Radiology No results found.  Procedures Procedures (including critical care time)  Medications Ordered in ED Medications  sodium chloride 0.9 % bolus 1,000 mL (0 mLs Intravenous Stopped 03/24/17 0101)  insulin aspart (novoLOG) injection 10 Units (10 Units Intravenous Given 03/23/17 2304)     Initial Impression / Assessment and Plan / ED Course  I have reviewed the triage vital signs and the nursing notes.  Pertinent labs & imaging results that were available during my care of the patient were reviewed by me and considered in my medical decision making (see chart for details).     57 year old male with a history of hypertension, hyperlipidemia, diabetes, frequent emergency department visits with 43 visits in the last 6 months, with frequent concern for hyperglycemia in the setting of noncompliance with his diabetes medications, presents with concern for hyperglycemia.  Labs show hyperglycemia without DKA. Given IV fluids,  insulin with improvement in glucose. Patient discharged in stable condition with understanding of reasons to return.   Final Clinical Impressions(s) / ED Diagnoses   Final diagnoses:  Hyperglycemia    ED Discharge Orders    None       Gareth Morgan, MD 03/24/17 [phone]

## 2017-03-24 NOTE — Discharge Instructions (Signed)
Do not eat sweet foods.  Do not drink beverages which contain sugar.  Try to drink 2 or 3 L of water each day.

## 2017-03-24 NOTE — ED Notes (Signed)
Departure charged on incorrect patient-Monique,RN

## 2017-03-24 NOTE — ED Notes (Signed)
Notified EDP,Wentz,MD. Pt. I-stat Chem 8 results glucose 642 and RN,Jon made aware.

## 2017-03-24 NOTE — ED Triage Notes (Signed)
Per [facility] picked patient up from [facility] eating pack of sugar cookies. Patient CBG reading "high" with EMS.

## 2017-03-24 NOTE — ED Provider Notes (Signed)
[place] [facility] DEPT Provider Note   CSN: [id number] Arrival date & time: 03/24/17  1820     History   Chief Complaint Chief Complaint  Patient presents with  . Hyperglycemia    HPI Mike [name] is a 57 y.o. male.  He presents for evaluation of "sugar high."  EMS found him eating sugar cookies, and transferred him here.  He states he last took his insulin this morning.  He was in the ED, at 1:45 AM this morning, and treated for hyperglycemia with IV fluids and insulin.  He denies nausea, or vomiting.  He is hungry and thirsty.  He is homeless.  He states he [place] insulin, [place].  He is homeless.  This is his 44th visit in the last 6 months.  He has known noncompliance with treatment strategies.  He has a care plan which has been reviewed.  There are no other known modifying factors.  HPI  Past Medical History:  Diagnosis Date  . Anxiety   . Anxiety   . Chronic lower back pain   . Depression   . DKA (diabetic ketoacidoses) ([place]) 07/30/2016  . Hyperlipemia   . Hypertension   . Migraine    "last one was ~ 4 yr ago" (12/23/2014)  . Seizures ([place])    "related to pills for anxiety; if I don't take the pills I'm suppose to take I'll have them" (12/23/2014)  . Type II diabetes mellitus [facility])     Patient Active Problem List   Diagnosis Date Noted  . AKI (acute kidney injury) ([place]) 03/04/2017  . Malnutrition of moderate degree 02/08/2017  . DKA (diabetic ketoacidoses) ([place]) 02/07/2017  . History of stroke 02/07/2017  . Hyperphosphatemia 12/13/2016  . Evaluation by psychiatric service required 11/30/2016  . Lactic acidosis 11/18/2016  . Normocytic anemia 11/06/2016  . HLD (hyperlipidemia) 11/06/2016  . Adjustment disorder with other symptoms 11/05/2016  . Uncontrolled type 2 diabetes mellitus with hyperosmolar nonketotic hyperglycemia ([place]) 08/24/2016  . Essential hypertension 08/24/2016  . Acute ischemic stroke ([place])     . Protein-calorie malnutrition, severe 12/24/2014  . Homelessness 12/23/2014  . Hypertensive urgency 12/23/2014  . DM (diabetes mellitus) ([place]) 12/23/2014  . Intermittent palpitations 12/23/2014  . Tobacco abuse 12/23/2014  . Pleuritic chest pain 12/23/2014  . Abnormal EKG 12/23/2014  . Type II diabetes mellitus with renal manifestations [facility])     Past Surgical History:  Procedure Laterality Date  . NO PAST SURGERIES         Home Medications    Prior to Admission medications   Medication Sig Start Date End Date Taking? Authorizing Provider  aspirin 325 MG tablet Take 1 tablet (325 mg total) by mouth daily. 03/08/17   Georgette Shell, MD  atorvastatin (LIPITOR) 40 MG tablet Take 1 tablet (40 mg total) by mouth daily. 01/26/17   Cardama, Grayce Sessions, MD  blood glucose meter kit and supplies KIT Dispense based on patient and insurance preference. Use up to four times daily as directed. (FOR ICD-9 250.00, 250.01). 11/22/16   Velvet Bathe, MD  ferrous sulfate 325 (65 FE) MG tablet Take 1 tablet (325 mg total) daily by mouth. Patient taking differently: Take 325 mg by mouth daily as needed (low iron).  11/23/16   Ward, Delice Bison, DO  insulin aspart protamine- aspart (NOVOLOG MIX 70/30) (70-30) 100 UNIT/ML injection Inject 0.3 mLs (30 Units total) into the skin 2 (two) times daily with a meal. 02/13/17  Charlynne Cousins, MD  lisinopril (PRINIVIL,ZESTRIL) 40 MG tablet Take 1 tablet (40 mg total) by mouth daily. 01/26/17   Fatima Blank, MD  metoprolol tartrate (LOPRESSOR) 25 MG tablet Take 0.5 tablets (12.5 mg total) by mouth 2 (two) times daily. 03/07/17   Georgette Shell, MD    Family History Family History  Problem Relation Age of Onset  . Diabetes Mellitus II Mother   . Diabetes Mellitus II Father     Social History Social History   Tobacco Use  . Smoking status: Current Every Day Smoker    Packs/day: 0.10    Years: 40.00    Pack years: 4.00    Types:  Cigarettes  . Smokeless tobacco: Never Used  Substance Use Topics  . Alcohol use: Yes    Alcohol/week: 1.2 oz    Types: 2 Cans of beer per week  . Drug use: No    Comment: 12/23/2014 "stopped ~ 10 yrs ago"     Allergies   Ibuprofen and Tylenol [acetaminophen]   Review of Systems Review of Systems  All other systems reviewed and are negative.    Physical Exam Updated Vital Signs BP (!) 176/75 (BP Location: Left Arm) Comment (BP Location): right arm restriction   Pulse 72   Temp 98.1 F (36.7 C) (Oral)   Resp 16   SpO2 97%   Physical Exam  Constitutional: He is oriented to person, place, and time. He appears well-developed.  He appears older than stated age.  He is disheveled.  HENT:  Head: Normocephalic and atraumatic.  Right Ear: External ear normal.  Left Ear: External ear normal.  Eyes: Conjunctivae and EOM are normal. Pupils are equal, round, and reactive to light.  Neck: Normal range of motion and phonation normal. Neck supple.  Cardiovascular: Normal rate.  Pulmonary/Chest: Effort normal. He exhibits no bony tenderness.  Musculoskeletal: Normal range of motion.  Normal ambulation  Neurological: He is alert and oriented to person, place, and time. No cranial nerve deficit or sensory deficit. He exhibits normal muscle tone. Coordination normal.  No dysarthria or aphasia  Skin: Skin is warm, dry and intact.  Psychiatric: He has a normal mood and affect. His behavior is normal. Judgment and thought content normal.  Nursing note and vitals reviewed.    ED Treatments / Results  Labs (all labs ordered are listed, but only abnormal results are displayed) Labs Reviewed  CBG MONITORING, ED - Abnormal; Notable for the following components:      Result Value   Glucose-Capillary >600 (*)    All other components within normal limits  I-STAT CHEM 8, ED - Abnormal; Notable for the following components:   Chloride 98 (*)    Glucose, Bld 642 (*)    Hemoglobin 10.2 (*)     HCT 30.0 (*)    All other components within normal limits    EKG  EKG Interpretation None       Radiology No results found.  Procedures Procedures (including critical care time)  Medications Ordered in ED Medications  insulin aspart protamine- aspart (NOVOLOG MIX 70/30) injection 30 Units (30 Units Subcutaneous Given 03/24/17 2144)  sodium chloride 0.9 % bolus 1,000 mL (1,000 mLs Intravenous New Bag/Given 03/24/17 2148)     Initial Impression / Assessment and Plan / ED Course  I have reviewed the triage vital signs and the nursing notes.  Pertinent labs & imaging results that were available during my care of the patient were reviewed by me and  considered in my medical decision making (see chart for details).      Patient Vitals for the past 24 hrs:  BP Temp Temp src Pulse Resp SpO2  03/24/17 2150 (!) 176/75 98.1 F (36.7 C) Oral 72 16 97 %  03/24/17 1832 (!) 172/104 97.7 F (36.5 C) Oral 94 18 98 %    10:51 PM Reevaluation with update and discussion. After initial assessment and treatment, an updated evaluation reveals no change in clinical status.  Findings discussed with the patient and all questions answered. Daleen Bo      Final Clinical Impressions(s) / ED Diagnoses   Final diagnoses:  Hyperglycemia   Recurrent hypoglycemia associated with noncompliance.  Anion gap 7 today.  Patient is nontoxic.  She was found today eating sugar cookies prior to EMS transport.  No indication for hospitalization or further ED evaluation or treatment.  Nursing Notes Reviewed/ Care Coordinated Applicable Imaging Reviewed Interpretation of Laboratory Data incorporated into ED treatment  The patient appears reasonably screened and/or stabilized for discharge and I doubt any other medical condition or other [facility] requiring further screening, evaluation, or treatment in the ED at this time prior to discharge.  Plan: Home Medications-continue current medications.; Home  Treatments-avoid sweets, drink 2 L of water each day; return here if the recommended treatment, does not improve the symptoms; Recommended follow up-PCP follow-up 2 or 3 days.    ED Discharge Orders    None       Daleen Bo, MD 03/24/17 [phone]

## 2017-03-25 ENCOUNTER — Emergency Department (HOSPITAL_COMMUNITY): Admission: EM | Admit: 2017-03-25 | Discharge: 2017-03-26 | Discharge status: Against Medical Advice | Payer: Self-pay

## 2017-03-26 ENCOUNTER — Encounter (HOSPITAL_COMMUNITY): Payer: Self-pay

## 2017-03-26 ENCOUNTER — Encounter (HOSPITAL_COMMUNITY): Payer: Self-pay | Admitting: Emergency Medicine

## 2017-03-26 ENCOUNTER — Emergency Department (HOSPITAL_COMMUNITY)
Admission: EM | Admit: 2017-03-26 | Discharge: 2017-03-26 | Disposition: A | Discharge status: Home / Self Care | Payer: Self-pay | Attending: Emergency Medicine | Admitting: Emergency Medicine

## 2017-03-26 DIAGNOSIS — R739 Hyperglycemia, unspecified: Secondary | ICD-10-CM

## 2017-03-26 DIAGNOSIS — Z794 Long term (current) use of insulin: Secondary | ICD-10-CM | POA: Insufficient documentation

## 2017-03-26 DIAGNOSIS — E1165 Type 2 diabetes mellitus with hyperglycemia: Secondary | ICD-10-CM | POA: Insufficient documentation

## 2017-03-26 DIAGNOSIS — N179 Acute kidney failure, unspecified: Secondary | ICD-10-CM | POA: Insufficient documentation

## 2017-03-26 DIAGNOSIS — Z7982 Long term (current) use of aspirin: Secondary | ICD-10-CM | POA: Insufficient documentation

## 2017-03-26 DIAGNOSIS — I10 Essential (primary) hypertension: Secondary | ICD-10-CM | POA: Insufficient documentation

## 2017-03-26 DIAGNOSIS — Z79899 Other long term (current) drug therapy: Secondary | ICD-10-CM | POA: Insufficient documentation

## 2017-03-26 DIAGNOSIS — F17210 Nicotine dependence, cigarettes, uncomplicated: Secondary | ICD-10-CM | POA: Insufficient documentation

## 2017-03-26 DIAGNOSIS — E119 Type 2 diabetes mellitus without complications: Secondary | ICD-10-CM | POA: Insufficient documentation

## 2017-03-26 NOTE — ED Notes (Addendum)
Registration states that person in lobby came up saying they believe patient had incontinence. Registration made this triage RN aware.  I made Matt, Consulting civil engineerCharge RN, aware who instructed to leave patient in lobby until we get an available room. This RN made Registration aware of what Charge RN stated.

## 2017-03-26 NOTE — Discharge Instructions (Signed)
Please be sure to follow-up with your doctor today, and discuss medication regimens that may be easier to adhere to, and do not hesitate to return for concerning changes in your condition.

## 2017-03-26 NOTE — ED Provider Notes (Signed)
[place] DEPT Provider Note   CSN: [id number] Arrival date & time: 03/26/17  6195     History   Chief Complaint No chief complaint on file.   HPI Mike [name] is a 57 y.o. male.  HPI  Patient with multiple medical issues including poor medication compliance, diabetes.  Patient denies complaints, is ambulating, stating that he feels fine, wants to leave, see his outpatient provider. Notably, patient was seen here 2 days ago, and has had 57 emergency department visits in 6 months. The patient acknowledges history of diabetes, states that he does not take his medicine regularly.  By the time of my evaluation the patient has had a bowel movement, though the initial report was incontinence, patient denies this.  He states that he was simply tired.    Past Medical History:  Diagnosis Date  . Anxiety   . Anxiety   . Chronic lower back pain   . Depression   . DKA (diabetic ketoacidoses) ([place]) 07/30/2016  . Hyperlipemia   . Hypertension   . Migraine    "last one was ~ 4 yr ago" (12/23/2014)  . Seizures ([place])    "related to pills for anxiety; if I don't take the pills I'm suppose to take I'll have them" (12/23/2014)  . Type II diabetes mellitus [facility])     Patient Active Problem List   Diagnosis Date Noted  . AKI (acute kidney injury) ([place]) 03/04/2017  . Malnutrition of moderate degree 02/08/2017  . DKA (diabetic ketoacidoses) ([place]) 02/07/2017  . History of stroke 02/07/2017  . Hyperphosphatemia 12/13/2016  . Evaluation by psychiatric service required 11/30/2016  . Lactic acidosis 11/18/2016  . Normocytic anemia 11/06/2016  . HLD (hyperlipidemia) 11/06/2016  . Adjustment disorder with other symptoms 11/05/2016  . Uncontrolled type 2 diabetes mellitus with hyperosmolar nonketotic hyperglycemia ([place]) 08/24/2016  . Essential hypertension 08/24/2016  . Acute ischemic stroke ([place])   . Protein-calorie malnutrition, severe 12/24/2014  .  Homelessness 12/23/2014  . Hypertensive urgency 12/23/2014  . DM (diabetes mellitus) ([place]) 12/23/2014  . Intermittent palpitations 12/23/2014  . Tobacco abuse 12/23/2014  . Pleuritic chest pain 12/23/2014  . Abnormal EKG 12/23/2014  . Type II diabetes mellitus with renal manifestations [facility])     Past Surgical History:  Procedure Laterality Date  . NO PAST SURGERIES         Home Medications    Prior to Admission medications   Medication Sig Start Date End Date Taking? Authorizing Provider  aspirin 325 MG tablet Take 1 tablet (325 mg total) by mouth daily. 03/08/17   Georgette Shell, MD  atorvastatin (LIPITOR) 40 MG tablet Take 1 tablet (40 mg total) by mouth daily. 01/26/17   Cardama, Grayce Sessions, MD  blood glucose meter kit and supplies KIT Dispense based on patient and insurance preference. Use up to four times daily as directed. (FOR ICD-9 250.00, 250.01). 11/22/16   Velvet Bathe, MD  ferrous sulfate 325 (65 FE) MG tablet Take 1 tablet (325 mg total) daily by mouth. Patient taking differently: Take 325 mg by mouth daily as needed (low iron).  11/23/16   Ward, Delice Bison, DO  insulin aspart protamine- aspart (NOVOLOG MIX 70/30) (70-30) 100 UNIT/ML injection Inject 0.3 mLs (30 Units total) into the skin 2 (two) times daily with a meal. 02/13/17   Charlynne Cousins, MD  lisinopril (PRINIVIL,ZESTRIL) 40 MG tablet Take 1 tablet (40 mg total) by mouth daily. 01/26/17   Fatima Blank, MD  metoprolol tartrate (  LOPRESSOR) 25 MG tablet Take 0.5 tablets (12.5 mg total) by mouth 2 (two) times daily. 03/07/17   Georgette Shell, MD    Family History Family History  Problem Relation Age of Onset  . Diabetes Mellitus II Mother   . Diabetes Mellitus II Father     Social History Social History   Tobacco Use  . Smoking status: Current Every Day Smoker    Packs/day: 0.10    Years: 40.00    Pack years: 4.00    Types: Cigarettes  . Smokeless tobacco: Never Used    Substance Use Topics  . Alcohol use: Yes    Alcohol/week: 1.2 oz    Types: 2 Cans of beer per week  . Drug use: No    Comment: 12/23/2014 "stopped ~ 10 yrs ago"     Allergies   Ibuprofen and Tylenol [acetaminophen]   Review of Systems Review of Systems  Constitutional:       Per HPI, otherwise negative  HENT:       Per HPI, otherwise negative  Respiratory:       Per HPI, otherwise negative  Cardiovascular:       Per HPI, otherwise negative  Gastrointestinal: Negative for vomiting.  Endocrine:       Negative aside from HPI  Genitourinary:       Neg aside from HPI   Musculoskeletal:       Per HPI, otherwise negative  Skin: Negative.   Neurological: Positive for weakness. Negative for syncope.     Physical Exam Updated Vital Signs BP (!) 158/85 (BP Location: Right Arm)   Pulse 70   Temp 98.9 F (37.2 C) (Oral)   Resp 15   SpO2 99%   Physical Exam  Constitutional: He is oriented to person, place, and time. He appears well-developed. No distress.  Tall thin male ambulating, in no distress, speaking clearly, briefly  HENT:  Head: Normocephalic and atraumatic.  Eyes: Conjunctivae and EOM are normal.  Cardiovascular: Normal rate and regular rhythm.  Pulmonary/Chest: Effort normal. No stridor. No respiratory distress.  Abdominal: He exhibits no distension.  Musculoskeletal: He exhibits no edema.  Neurological: He is alert and oriented to person, place, and time.  Skin: Skin is warm and dry.  Psychiatric: He has a normal mood and affect.  Nursing note and vitals reviewed.    ED Treatments / Results   Procedures Procedures (including critical care time)  Medications Ordered in ED Medications - No data to display   Initial Impression / Assessment and Plan / ED Course  I have reviewed the triage vital signs and the nursing notes.  Pertinent labs & imaging results that were available during my care of the patient were reviewed by me and considered in my  medical decision making (see chart for details).  Review of patient's chart is notable for demonstration of 44 prior ED visits in the past 6 months. Patient is awake, alert, in no distress, ambulatory, stating that he wants to go, follow-up with his outpatient provider. No evidence for decompensated hyperglycemic state, though he does have hyperglycemia.  He is capable make this decision, was discharged to follow-up with outpatient provider, encouraged to discuss his medications, in particular to ensure better compliance, if possible.    Final Clinical Impressions(s) / ED Diagnoses   Final diagnoses:  Hyperglycemia      Carmin Muskrat, MD 03/26/17 [phone]

## 2017-03-26 NOTE — ED Triage Notes (Signed)
Per [facility] pt went to [employer] that were at a store and requested EMS bc sugar was high but was seen in store eating snacks.

## 2017-03-26 NOTE — ED Triage Notes (Signed)
Pt didn't answer when called from [facility]

## 2017-03-26 NOTE — ED Notes (Signed)
Bed: WTR9 Expected date:  Expected time:  Means of arrival:  Comments: 

## 2017-03-26 NOTE — ED Notes (Signed)
Pt given basin with warm water, soap, scrubs and socks to clean up and change.

## 2017-03-26 NOTE — ED Triage Notes (Addendum)
Pt called from triage and didn't answer, pt is sleeping in lobby

## 2017-03-26 NOTE — ED Triage Notes (Signed)
Pt complains of his blood sugar being elevated Pt is non compliant with diet or meds Pt is incontinent of urine and stool in the lobby

## 2017-03-27 ENCOUNTER — Emergency Department (HOSPITAL_COMMUNITY)
Admission: EM | Admit: 2017-03-27 | Discharge: 2017-03-27 | Disposition: A | Discharge status: Home / Self Care | Payer: Self-pay | Attending: Emergency Medicine | Admitting: Emergency Medicine

## 2017-03-27 ENCOUNTER — Encounter (HOSPITAL_COMMUNITY): Payer: Self-pay | Admitting: Emergency Medicine

## 2017-03-27 ENCOUNTER — Emergency Department (HOSPITAL_COMMUNITY): Payer: Self-pay

## 2017-03-27 ENCOUNTER — Emergency Department (HOSPITAL_COMMUNITY)
Admission: EM | Admit: 2017-03-27 | Discharge: 2017-03-28 | Disposition: A | Discharge status: Home / Self Care | Payer: Self-pay | Attending: Emergency Medicine | Admitting: Emergency Medicine

## 2017-03-27 DIAGNOSIS — F17210 Nicotine dependence, cigarettes, uncomplicated: Secondary | ICD-10-CM | POA: Insufficient documentation

## 2017-03-27 DIAGNOSIS — Z7982 Long term (current) use of aspirin: Secondary | ICD-10-CM | POA: Insufficient documentation

## 2017-03-27 DIAGNOSIS — E875 Hyperkalemia: Secondary | ICD-10-CM | POA: Insufficient documentation

## 2017-03-27 DIAGNOSIS — I10 Essential (primary) hypertension: Secondary | ICD-10-CM | POA: Insufficient documentation

## 2017-03-27 DIAGNOSIS — E119 Type 2 diabetes mellitus without complications: Secondary | ICD-10-CM | POA: Insufficient documentation

## 2017-03-27 DIAGNOSIS — R739 Hyperglycemia, unspecified: Secondary | ICD-10-CM | POA: Insufficient documentation

## 2017-03-27 DIAGNOSIS — R350 Frequency of micturition: Secondary | ICD-10-CM | POA: Insufficient documentation

## 2017-03-27 DIAGNOSIS — R110 Nausea: Secondary | ICD-10-CM | POA: Insufficient documentation

## 2017-03-27 DIAGNOSIS — Z79899 Other long term (current) drug therapy: Secondary | ICD-10-CM | POA: Insufficient documentation

## 2017-03-27 DIAGNOSIS — Z794 Long term (current) use of insulin: Secondary | ICD-10-CM | POA: Insufficient documentation

## 2017-03-27 LAB — URINALYSIS, ROUTINE W REFLEX MICROSCOPIC
BACTERIA UA: NONE SEEN
Bilirubin Urine: NEGATIVE
Glucose, UA: >=500 mg/dL — AB
Hgb urine dipstick: NEGATIVE
KETONES UR: 5 mg/dL — AB
Leukocytes, UA: NEGATIVE
Nitrite: NEGATIVE
PH: 8 (ref 5.0–8.0)
Protein, ur: NEGATIVE mg/dL
Specific Gravity, Urine: 1.024 (ref 1.005–1.030)
WBC UA: NONE SEEN WBC/hpf (ref 0–5)

## 2017-03-27 LAB — CBC WITH DIFFERENTIAL/PLATELET
BASOS ABS: 0 10*3/uL (ref 0.0–0.1)
BASOS PCT: 0 %
EOS ABS: 0.1 10*3/uL (ref 0.0–0.7)
EOS PCT: 1 %
HCT: 31.3 % — ABNORMAL LOW (ref 39.0–52.0)
HEMOGLOBIN: 9.4 g/dL — AB (ref 13.0–17.0)
Lymphocytes Relative: 15 %
Lymphs Abs: 1.7 10*3/uL (ref 0.7–4.0)
MCH: 24.9 pg — ABNORMAL LOW (ref 26.0–34.0)
MCHC: 30 g/dL (ref 30.0–36.0)
MCV: 83 fL (ref 78.0–100.0)
Monocytes Absolute: 0.7 10*3/uL (ref 0.1–1.0)
Monocytes Relative: 6 %
Neutro Abs: 8.7 10*3/uL — ABNORMAL HIGH (ref 1.7–7.7)
Neutrophils Relative %: 78 %
PLATELETS: 421 10*3/uL — AB (ref 150–400)
RBC: 3.77 MIL/uL — AB (ref 4.22–5.81)
RDW: 15.8 % — ABNORMAL HIGH (ref 11.5–15.5)
WBC: 11.1 10*3/uL — AB (ref 4.0–10.5)

## 2017-03-27 LAB — COMPREHENSIVE METABOLIC PANEL
ALBUMIN: 3.5 g/dL (ref 3.5–5.0)
ALK PHOS: 131 U/L — AB (ref 38–126)
ALT: 24 U/L (ref 17–63)
AST: 21 U/L (ref 15–41)
Anion gap: 12 (ref 5–15)
BUN: 9 mg/dL (ref 6–20)
CALCIUM: 9.6 mg/dL (ref 8.9–10.3)
CHLORIDE: 91 mmol/L — AB (ref 101–111)
CO2: 29 mmol/L (ref 22–32)
CREATININE: 1.04 mg/dL (ref 0.61–1.24)
GFR calc Af Amer: >60 mL/min (ref >60)
GFR calc non Af Amer: >60 mL/min (ref >60)
GLUCOSE: 790 mg/dL — AB (ref 65–99)
Potassium: 5.3 mmol/L — ABNORMAL HIGH (ref 3.5–5.1)
SODIUM: 132 mmol/L — AB (ref 135–145)
Total Bilirubin: 0.9 mg/dL (ref 0.3–1.2)
Total Protein: 7.5 g/dL (ref 6.5–8.1)

## 2017-03-27 LAB — BASIC METABOLIC PANEL
ANION GAP: 11 (ref 5–15)
BUN: 7 mg/dL (ref 6–20)
CO2: 28 mmol/L (ref 22–32)
Calcium: 9.6 mg/dL (ref 8.9–10.3)
Chloride: 100 mmol/L — ABNORMAL LOW (ref 101–111)
Creatinine, Ser: 0.92 mg/dL (ref 0.61–1.24)
GLUCOSE: 575 mg/dL — AB (ref 65–99)
POTASSIUM: 4.1 mmol/L (ref 3.5–5.1)
SODIUM: 139 mmol/L (ref 135–145)

## 2017-03-27 LAB — CBG MONITORING, ED
GLUCOSE-CAPILLARY: 450 mg/dL — AB (ref 65–99)
GLUCOSE-CAPILLARY: 591 mg/dL — AB (ref 65–99)

## 2017-03-27 MED ORDER — SODIUM CHLORIDE 0.9 % IV BOLUS (SEPSIS)
1000.0000 mL | Freq: Once | INTRAVENOUS | Status: AC
  Administered 2017-03-27: 1000 mL via INTRAVENOUS

## 2017-03-27 MED ORDER — INSULIN ASPART 100 UNIT/ML ~~LOC~~ SOLN
10.0000 [IU] | Freq: Once | SUBCUTANEOUS | Status: AC
  Administered 2017-03-27: 10 [IU] via SUBCUTANEOUS
  Filled 2017-03-27: qty 1

## 2017-03-27 NOTE — ED Provider Notes (Signed)
[facility] EMERGENCY DEPARTMENT Provider Note   CSN: [id number] Arrival date & time: 03/27/17  1924     History   Chief Complaint Chief Complaint  Patient presents with  . Hyperglycemia    HPI Mike [name] is a 57 y.o. male.   Patient with history of uncontrolled diabetes, noncompliance presents the emergency department today with elevated blood sugar.  Per EMS, patient was found urinating on the sidewalk and EMS was called.  Patient states that he takes insulin for his diabetes.  He states his last dose was this morning.  He currently has no other medical complaints including recent illness viral or flulike symptoms, vomiting, diarrhea.  No abdominal pain or chest pain reported.  States that he is urinating frequently and has some nausea.  Patient was last seen in the emergency department yesterday morning.      Past Medical History:  Diagnosis Date  . Anxiety   . Anxiety   . Chronic lower back pain   . Depression   . DKA (diabetic ketoacidoses) ([place]) 07/30/2016  . Hyperlipemia   . Hypertension   . Migraine    "last one was ~ 4 yr ago" (12/23/2014)  . Seizures ([place])    "related to pills for anxiety; if I don't take the pills I'm suppose to take I'll have them" (12/23/2014)  . Type II diabetes mellitus [facility])     Patient Active Problem List   Diagnosis Date Noted  . AKI (acute kidney injury) ([place]) 03/04/2017  . Malnutrition of moderate degree 02/08/2017  . DKA (diabetic ketoacidoses) ([place]) 02/07/2017  . History of stroke 02/07/2017  . Hyperphosphatemia 12/13/2016  . Evaluation by psychiatric service required 11/30/2016  . Lactic acidosis 11/18/2016  . Normocytic anemia 11/06/2016  . HLD (hyperlipidemia) 11/06/2016  . Adjustment disorder with other symptoms 11/05/2016  . Uncontrolled type 2 diabetes mellitus with hyperosmolar nonketotic hyperglycemia ([place]) 08/24/2016  . Essential hypertension 08/24/2016  . Acute ischemic stroke ([place])   .  Protein-calorie malnutrition, severe 12/24/2014  . Homelessness 12/23/2014  . Hypertensive urgency 12/23/2014  . DM (diabetes mellitus) ([place]) 12/23/2014  . Intermittent palpitations 12/23/2014  . Tobacco abuse 12/23/2014  . Pleuritic chest pain 12/23/2014  . Abnormal EKG 12/23/2014  . Type II diabetes mellitus with renal manifestations [facility])     Past Surgical History:  Procedure Laterality Date  . NO PAST SURGERIES         Home Medications    Prior to Admission medications   Medication Sig Start Date End Date Taking? Authorizing Provider  aspirin 325 MG tablet Take 1 tablet (325 mg total) by mouth daily. 03/08/17  Yes Georgette Shell, MD  atorvastatin (LIPITOR) 40 MG tablet Take 1 tablet (40 mg total) by mouth daily. 01/26/17  Yes Cardama, Grayce Sessions, MD  ferrous sulfate 325 (65 FE) MG tablet Take 1 tablet (325 mg total) daily by mouth. Patient taking differently: Take 325 mg by mouth daily as needed (low iron).  11/23/16  Yes Ward, Cyril Mourning N, DO  insulin aspart protamine- aspart (NOVOLOG MIX 70/30) (70-30) 100 UNIT/ML injection Inject 0.3 mLs (30 Units total) into the skin 2 (two) times daily with a meal. 02/13/17  Yes Charlynne Cousins, MD  lisinopril (PRINIVIL,ZESTRIL) 40 MG tablet Take 1 tablet (40 mg total) by mouth daily. 01/26/17  Yes Cardama, Grayce Sessions, MD  metoprolol tartrate (LOPRESSOR) 25 MG tablet Take 0.5 tablets (12.5 mg total) by mouth 2 (two) times daily. 03/07/17  Yes Georgette Shell,  MD  blood glucose meter kit and supplies KIT Dispense based on patient and insurance preference. Use up to four times daily as directed. (FOR ICD-9 250.00, 250.01). 11/22/16   Velvet Bathe, MD    Family History Family History  Problem Relation Age of Onset  . Diabetes Mellitus II Mother   . Diabetes Mellitus II Father     Social History Social History   Tobacco Use  . Smoking status: Current Every Day Smoker    Packs/day: 0.10    Years: 40.00    Pack years:  4.00    Types: Cigarettes  . Smokeless tobacco: Never Used  Substance Use Topics  . Alcohol use: Yes    Alcohol/week: 1.2 oz    Types: 2 Cans of beer per week  . Drug use: No    Comment: 12/23/2014 "stopped ~ 10 yrs ago"     Allergies   Ibuprofen and Tylenol [acetaminophen]   Review of Systems Review of Systems  Constitutional: Negative for fever.  HENT: Negative for rhinorrhea and sore throat.   Eyes: Negative for redness.  Respiratory: Negative for cough.   Cardiovascular: Negative for chest pain.  Gastrointestinal: Positive for nausea. Negative for abdominal pain, diarrhea and vomiting.  Genitourinary: Positive for frequency. Negative for decreased urine volume and dysuria.  Musculoskeletal: Negative for myalgias.  Skin: Negative for rash.  Neurological: Negative for headaches.     Physical Exam Updated Vital Signs BP (!) 187/80   Pulse 79   Temp 99.4 F (37.4 C) (Oral)   Resp 20   SpO2 98%   Physical Exam  Constitutional: He appears well-developed and well-nourished.  HENT:  Head: Normocephalic and atraumatic.  Mouth/Throat: Oropharynx is clear and moist.  Eyes: Conjunctivae are normal. Right eye exhibits no discharge. Left eye exhibits no discharge.  Neck: Normal range of motion. Neck supple.  Cardiovascular: Normal rate, regular rhythm and normal heart sounds.  Pulmonary/Chest: Effort normal and breath sounds normal. No stridor. No respiratory distress. He has no wheezes.  Abdominal: Soft. There is no tenderness. There is no rebound and no guarding.  Neurological: He is alert.  Skin: Skin is warm and dry.  Psychiatric: He has a normal mood and affect.  Nursing note and vitals reviewed.    ED Treatments / Results  Labs (all labs ordered are listed, but only abnormal results are displayed) Labs Reviewed  CBC WITH DIFFERENTIAL/PLATELET - Abnormal; Notable for the following components:      Result Value   WBC 11.1 (*)    RBC 3.77 (*)    Hemoglobin 9.4  (*)    HCT 31.3 (*)    MCH 24.9 (*)    RDW 15.8 (*)    Platelets 421 (*)    Neutro Abs 8.7 (*)    All other components within normal limits  COMPREHENSIVE METABOLIC PANEL - Abnormal; Notable for the following components:   Sodium 132 (*)    Potassium 5.3 (*)    Chloride 91 (*)    Glucose, Bld 790 (*)    Alkaline Phosphatase 131 (*)    All other components within normal limits  URINALYSIS, ROUTINE W REFLEX MICROSCOPIC - Abnormal; Notable for the following components:   Color, Urine COLORLESS (*)    Glucose, UA >=500 (*)    Ketones, ur 5 (*)    Squamous Epithelial / LPF 0-5 (*)    All other components within normal limits  CBG MONITORING, ED - Abnormal; Notable for the following components:   Glucose-Capillary >600 (*)  All other components within normal limits  CBG MONITORING, ED - Abnormal; Notable for the following components:   Glucose-Capillary 356 (*)    All other components within normal limits    EKG  EKG Interpretation  Date/Time:  Tuesday March 27 2017 22:31:34 EDT Ventricular Rate:  67 PR Interval:    QRS Duration: 86 QT Interval:  449 QTC Calculation: 474 R Axis:   57 Text Interpretation:  Sinus rhythm LVH by voltage Anterior Q waves, possibly due to LVH When compared to prior, wandering baseline. Less ST elevationin leads V3 and V4.  No STEMI Confirmed by Antony Blackbird [phone]) on 03/27/2017 10:35:51 PM       Radiology Dg Chest 2 View  Result Date: 03/27/2017 CLINICAL DATA:  Acute onset of hyperglycemia and generalized chest pain. Leukocytosis. EXAM: CHEST - 2 VIEW COMPARISON:  Chest radiograph performed 02/11/2017 FINDINGS: The lungs are well-aerated and clear. There is no evidence of focal opacification, pleural effusion or pneumothorax. The heart is normal in size; the mediastinal contour is within normal limits. No acute osseous abnormalities are seen. IMPRESSION: No acute cardiopulmonary process seen. Electronically Signed   By: Garald Balding M.D.   On:  03/27/2017 22:45    Procedures Procedures (including critical care time)  Medications Ordered in ED Medications  sodium chloride 0.9 % bolus 1,000 mL (0 mLs Intravenous Stopped 03/28/17 0000)  insulin aspart (novoLOG) injection 10 Units (10 Units Subcutaneous Given 03/27/17 2250)  sodium chloride 0.9 % bolus 1,000 mL (0 mLs Intravenous Stopped 03/28/17 0000)     Initial Impression / Assessment and Plan / ED Course  I have reviewed the triage vital signs and the nursing notes.  Pertinent labs & imaging results that were available during my care of the patient were reviewed by me and considered in my medical decision making (see chart for details).     Patient seen and examined. EKG reviewed.  Additional fluids ordered. Insulin ordered.  Compared lab work to previous, elevated white blood cell count today.  Chest x-ray ordered for this reason.  No chest pain or new EKG findings suggest ACS.  Anemia, at baseline, no bleeding reported today. BP elevated, no obvious end-organ damage, suspect chronic elevation.   Vital signs reviewed and are as follows: BP (!) 187/80   Pulse 79   Temp 99.4 F (37.4 C) (Oral)   Resp 20   SpO2 98%   10:57 PM CXR reviewed and is negative for infection.   12:52 AM Blood sugar improved.  Patient is stable.  He is drinking Diet Coke in the room.  Will discharge.  Patient encouraged to take his insulin and follow-up with his primary care doctor.  Return with worsening symptoms or other concerns.  Final Clinical Impressions(s) / ED Diagnoses   Final diagnoses:  Hyperglycemia without ketosis  Hyperkalemia   Hyperglycemia without ketosis.  Hyperkalemia without EKG changes.  This was treated with insulin in the emergency department as well as fluids.  No signs of DKA.  Patient stable for discharged home after improvement in blood sugar.  Mild leukocytosis noted, urine and chest x-ray are clear for infection.  No other obvious etiology for infection.  No  fever.  ED Discharge Orders    None      Carlisle Cater, [place] 03/28/17 2671  Tegeler, Gwenyth Allegra, MD 03/28/17 0157

## 2017-03-27 NOTE — ED Provider Notes (Signed)
MSE was initiated and I personally evaluated the patient and placed orders (if any) at  9:52 PM on March 27, 2017.  The patient appears stable so that the remainder of the MSE may be completed by another provider.  Pt presenting with hyperglycemia- he states he "ate too many sweets"  He states he received insulin tonight prior to arrival but does not know how much.  [facility]  He does not report fever, vomiting, abdominal pain, shortness of breath.  Labs have been obtained.  I have ordered IV fluids, insulin.  Pt has a care plan, after reducing glucose he should be able to be managed as an outpatient.     Phillis HaggisMabe, Shravya Wickwire L, MD 03/27/17 2153

## 2017-03-27 NOTE — ED Provider Notes (Signed)
[facility] DEPT Provider Note   CSN: [id number] Arrival date & time: 03/26/17  1745     History   Chief Complaint Chief Complaint  Patient presents with  . Hyperglycemia    HPI Mike [name] is a 57 y.o. male.  HPI  57 year old male past medical history significant for hypertension, diabetes, hyperlipidemia, noncompliant with medication, frequent emergency department visits that presents to the ED for concern for hyperglycemia.  This is in the setting of noncompliance with his diabetes medication along with antihypertensive medications.  Of note patient has been seen 46 times in the ED for same complaint.  On initial examination the patient has no complaints and he is eating applesauce, graham crackers and drinking coffee and soda.  Patient is a very poor historian.  Patient denies any associated nausea, emesis, abdominal pain.  Pt denies any fever, chill, ha, vision changes, lightheadedness, dizziness, congestion, neck pain, cp, sob, cough, abd pain, n/v/d, urinary symptoms, change in bowel habits, melena, hematochezia, lower extremity paresthesias.   Past Medical History:  Diagnosis Date  . Anxiety   . Anxiety   . Chronic lower back pain   . Depression   . DKA (diabetic ketoacidoses) ([place]) 07/30/2016  . Hyperlipemia   . Hypertension   . Migraine    "last one was ~ 4 yr ago" (12/23/2014)  . Seizures ([place])    "related to pills for anxiety; if I don't take the pills I'm suppose to take I'll have them" (12/23/2014)  . Type II diabetes mellitus [facility])     Patient Active Problem List   Diagnosis Date Noted  . AKI (acute kidney injury) ([place]) 03/04/2017  . Malnutrition of moderate degree 02/08/2017  . DKA (diabetic ketoacidoses) ([place]) 02/07/2017  . History of stroke 02/07/2017  . Hyperphosphatemia 12/13/2016  . Evaluation by psychiatric service required 11/30/2016  . Lactic acidosis 11/18/2016  . Normocytic anemia 11/06/2016  . HLD  (hyperlipidemia) 11/06/2016  . Adjustment disorder with other symptoms 11/05/2016  . Uncontrolled type 2 diabetes mellitus with hyperosmolar nonketotic hyperglycemia ([place]) 08/24/2016  . Essential hypertension 08/24/2016  . Acute ischemic stroke ([place])   . Protein-calorie malnutrition, severe 12/24/2014  . Homelessness 12/23/2014  . Hypertensive urgency 12/23/2014  . DM (diabetes mellitus) ([place]) 12/23/2014  . Intermittent palpitations 12/23/2014  . Tobacco abuse 12/23/2014  . Pleuritic chest pain 12/23/2014  . Abnormal EKG 12/23/2014  . Type II diabetes mellitus with renal manifestations [facility])     Past Surgical History:  Procedure Laterality Date  . NO PAST SURGERIES         Home Medications    Prior to Admission medications   Medication Sig Start Date End Date Taking? Authorizing Provider  aspirin 325 MG tablet Take 1 tablet (325 mg total) by mouth daily. 03/08/17  Yes Georgette Shell, MD  atorvastatin (LIPITOR) 40 MG tablet Take 1 tablet (40 mg total) by mouth daily. 01/26/17  Yes Cardama, Grayce Sessions, MD  blood glucose meter kit and supplies KIT Dispense based on patient and insurance preference. Use up to four times daily as directed. (FOR ICD-9 250.00, 250.01). 11/22/16  Yes Velvet Bathe, MD  ferrous sulfate 325 (65 FE) MG tablet Take 1 tablet (325 mg total) daily by mouth. Patient taking differently: Take 325 mg by mouth daily as needed (low iron).  11/23/16  Yes Ward, Cyril Mourning N, DO  insulin aspart protamine- aspart (NOVOLOG MIX 70/30) (70-30) 100 UNIT/ML injection Inject 0.3 mLs (30 Units total) into the skin 2 (two)  times daily with a meal. 02/13/17  Yes Charlynne Cousins, MD  lisinopril (PRINIVIL,ZESTRIL) 40 MG tablet Take 1 tablet (40 mg total) by mouth daily. 01/26/17  Yes Cardama, Grayce Sessions, MD  metoprolol tartrate (LOPRESSOR) 25 MG tablet Take 0.5 tablets (12.5 mg total) by mouth 2 (two) times daily. 03/07/17  Yes Georgette Shell, MD    Family  History Family History  Problem Relation Age of Onset  . Diabetes Mellitus II Mother   . Diabetes Mellitus II Father     Social History Social History   Tobacco Use  . Smoking status: Current Every Day Smoker    Packs/day: 0.10    Years: 40.00    Pack years: 4.00    Types: Cigarettes  . Smokeless tobacco: Never Used  Substance Use Topics  . Alcohol use: Yes    Alcohol/week: 1.2 oz    Types: 2 Cans of beer per week  . Drug use: No    Comment: 12/23/2014 "stopped ~ 10 yrs ago"     Allergies   Ibuprofen and Tylenol [acetaminophen]   Review of Systems Review of Systems  All other systems reviewed and are negative.    Physical Exam Updated Vital Signs BP (!) 198/98 (BP Location: Right Arm)   Pulse 78   Temp 98.6 F (37 C) (Oral)   Resp 20   SpO2 99%   Physical Exam  Constitutional: He appears well-developed and well-nourished. No distress.  Patient is disheveled.  Smells of urine and feces.  Thin appearing.  HENT:  Head: Normocephalic and atraumatic.  Eyes: Right eye exhibits no discharge. Left eye exhibits no discharge. No scleral icterus.  Neck: Normal range of motion.  Cardiovascular: Normal rate, regular rhythm, normal heart sounds and intact distal pulses. Exam reveals no gallop and no friction rub.  No murmur heard. Pulmonary/Chest: Effort normal and breath sounds normal. No stridor. No respiratory distress. He has no wheezes. He has no rales. He exhibits no tenderness.  Abdominal: Soft. Bowel sounds are normal.  Musculoskeletal: Normal range of motion.  Neurological: He is alert.  Skin: Skin is warm and dry. Capillary refill takes less than 2 seconds. No pallor.  Psychiatric: His behavior is normal. Judgment and thought content normal.  Nursing note and vitals reviewed.    ED Treatments / Results  Labs (all labs ordered are listed, but only abnormal results are displayed) Labs Reviewed  BASIC METABOLIC PANEL - Abnormal; Notable for the following  components:      Result Value   Chloride 100 (*)    Glucose, Bld 575 (*)    All other components within normal limits  CBG MONITORING, ED - Abnormal; Notable for the following components:   Glucose-Capillary 591 (*)    All other components within normal limits    EKG  EKG Interpretation None       Radiology No results found.  Procedures Procedures (including critical care time)  Medications Ordered in ED Medications  sodium chloride 0.9 % bolus 1,000 mL (0 mLs Intravenous Stopped 03/27/17 0309)     Initial Impression / Assessment and Plan / ED Course  I have reviewed the triage vital signs and the nursing notes.  Pertinent labs & imaging results that were available during my care of the patient were reviewed by me and considered in my medical decision making (see chart for details).     Patient presents to the ED with concern for hyperglycemia.  Of note patient has been seen 46 times in  the last 6 months for same symptoms.  This is in the setting of noncompliance with his diabetes medication.  Lab work shows hyperglycemia without DKA.  Given IV fluids.  On reassessment patient is eating graham crackers, applesauce and drinking soda in the room.  Encourage patient to use his insulin as prescribed. Patient discharged in stable condition with understanding of reasons to return.    Patient is hypertensive with history of same.  Does not take medications as prescribed.  Denies any associated chest pain or shortness of breath along with vision changes or headache.  .Pt is hemodynamically stable, in NAD, & able to ambulate in the ED. Evaluation does not show pathology that would require ongoing emergent intervention or inpatient treatment. I explained the diagnosis to the patient. Pain has been managed & has no complaints prior to dc. Pt is comfortable with above plan and is stable for discharge at this time. All questions were answered prior to disposition. Strict return precautions  for f/u to the ED were discussed. Encouraged follow up with PCP.    Final Clinical Impressions(s) / ED Diagnoses   Final diagnoses:  Hyperglycemia    ED Discharge Orders    None      Aaron Edelman 74/94/49 [id number]  Delora Fuel, MD 16/38/46 [phone]

## 2017-03-27 NOTE — Care Management (Addendum)
Patient has had 39 ED visits [facility] with similar complaints elevated CBG, patient is a DM with episodes of DKA and  patient is homeless.  [facility]. ED CM has attempted several times to assist with medications and placements patient returns to ED without medications, and unaware where he has left his insulin.

## 2017-03-27 NOTE — ED Triage Notes (Signed)
Arrived with EMS from street found by a by-stander urinating on the street , CBG = 600+ .

## 2017-03-27 NOTE — ED Notes (Signed)
Patient transported to X-ray 

## 2017-03-28 ENCOUNTER — Emergency Department (HOSPITAL_COMMUNITY)
Admission: EM | Admit: 2017-03-28 | Discharge: 2017-03-28 | Disposition: A | Discharge status: Home / Self Care | Payer: Self-pay | Attending: Emergency Medicine | Admitting: Emergency Medicine

## 2017-03-28 ENCOUNTER — Other Ambulatory Visit: Payer: Self-pay

## 2017-03-28 ENCOUNTER — Encounter (HOSPITAL_COMMUNITY): Payer: Self-pay | Admitting: *Deleted

## 2017-03-28 DIAGNOSIS — Z7982 Long term (current) use of aspirin: Secondary | ICD-10-CM | POA: Insufficient documentation

## 2017-03-28 DIAGNOSIS — R739 Hyperglycemia, unspecified: Secondary | ICD-10-CM

## 2017-03-28 DIAGNOSIS — F17210 Nicotine dependence, cigarettes, uncomplicated: Secondary | ICD-10-CM | POA: Insufficient documentation

## 2017-03-28 DIAGNOSIS — Z794 Long term (current) use of insulin: Secondary | ICD-10-CM | POA: Insufficient documentation

## 2017-03-28 DIAGNOSIS — E1165 Type 2 diabetes mellitus with hyperglycemia: Secondary | ICD-10-CM | POA: Insufficient documentation

## 2017-03-28 DIAGNOSIS — Z590 Homelessness: Secondary | ICD-10-CM | POA: Insufficient documentation

## 2017-03-28 DIAGNOSIS — I10 Essential (primary) hypertension: Secondary | ICD-10-CM | POA: Insufficient documentation

## 2017-03-28 LAB — CBG MONITORING, ED
Glucose-Capillary: 356 mg/dL — ABNORMAL HIGH (ref 65–99)
Glucose-Capillary: 502 mg/dL (ref 65–99)
Glucose-Capillary: >600 mg/dL (ref 65–99)

## 2017-03-28 LAB — I-STAT CHEM 8, ED
BUN: 10 mg/dL (ref 6–20)
CHLORIDE: 100 mmol/L — AB (ref 101–111)
CREATININE: 0.6 mg/dL — AB (ref 0.61–1.24)
Calcium, Ion: 1.08 mmol/L — ABNORMAL LOW (ref 1.15–1.40)
Glucose, Bld: 665 mg/dL (ref 65–99)
HEMATOCRIT: 28 % — AB (ref 39.0–52.0)
Hemoglobin: 9.5 g/dL — ABNORMAL LOW (ref 13.0–17.0)
Potassium: 3.9 mmol/L (ref 3.5–5.1)
Sodium: 136 mmol/L (ref 135–145)
TCO2: 26 mmol/L (ref 22–32)

## 2017-03-28 MED ORDER — SODIUM CHLORIDE 0.9 % IV BOLUS (SEPSIS)
1000.0000 mL | Freq: Once | INTRAVENOUS | Status: AC
  Administered 2017-03-28: 1000 mL via INTRAVENOUS

## 2017-03-28 MED ORDER — INSULIN ASPART PROT & ASPART (70-30 MIX) 100 UNIT/ML ~~LOC~~ SUSP
30.0000 [IU] | Freq: Two times a day (BID) | SUBCUTANEOUS | Status: DC
  Administered 2017-03-28: 30 [IU] via SUBCUTANEOUS
  Filled 2017-03-28: qty 10

## 2017-03-28 MED ORDER — INSULIN ASPART PROT & ASPART (70-30 MIX) 100 UNIT/ML ~~LOC~~ SUSP: 30.0000 [IU] | Freq: Two times a day (BID) | SUBCUTANEOUS | 11 refills | Status: DC

## 2017-03-28 MED ORDER — INSULIN ASPART 100 UNIT/ML ~~LOC~~ SOLN
10.0000 [IU] | Freq: Once | SUBCUTANEOUS | Status: AC
  Administered 2017-03-28: 10 [IU] via INTRAVENOUS
  Filled 2017-03-28: qty 1

## 2017-03-28 NOTE — ED Notes (Addendum)
Pt verbalizes understanding of d/c instructions. Pt given a set of new clean scrubs and socks. Pt ambulatory at d/c with all belongings.

## 2017-03-28 NOTE — ED Notes (Signed)
Patient refused to get up and get dressed x3.  Security called and attempting to get patient to leave at this time.

## 2017-03-28 NOTE — ED Provider Notes (Signed)
[facility] DEPT Provider Note   CSN: [id number] Arrival date & time: 03/28/17  1808     History   Chief Complaint Chief Complaint  Patient presents with  . Hyperglycemia    HPI Mike [name] is a 57 y.o. male.  HPI Patient presents to the emergency room with complaints of hyperglycemia.The patient has a known history of diabetes, homelessness and medical noncompliance.  Patient typically presents to the emergency room several times a week for the same complaints.  Recently, he has been urinating and defecating in the middle of the waiting room in front of other patients.  Patient states he took his insulin this morning however the patient regularly does not take his medications.  Patient was last in the emergency room yesterday evening and was discharged about early this morning.  Patient denies any trouble with nausea vomiting.  No chest pain or shortness of breath.  No fevers.  Past Medical History:  Diagnosis Date  . Anxiety   . Anxiety   . Chronic lower back pain   . Depression   . DKA (diabetic ketoacidoses) ([place]) 07/30/2016  . Hyperlipemia   . Hypertension   . Migraine    "last one was ~ 4 yr ago" (12/23/2014)  . Seizures ([place])    "related to pills for anxiety; if I don't take the pills I'm suppose to take I'll have them" (12/23/2014)  . Type II diabetes mellitus [facility])     Patient Active Problem List   Diagnosis Date Noted  . AKI (acute kidney injury) ([place]) 03/04/2017  . Malnutrition of moderate degree 02/08/2017  . DKA (diabetic ketoacidoses) ([place]) 02/07/2017  . History of stroke 02/07/2017  . Hyperphosphatemia 12/13/2016  . Evaluation by psychiatric service required 11/30/2016  . Lactic acidosis 11/18/2016  . Normocytic anemia 11/06/2016  . HLD (hyperlipidemia) 11/06/2016  . Adjustment disorder with other symptoms 11/05/2016  . Uncontrolled type 2 diabetes mellitus with hyperosmolar nonketotic hyperglycemia ([place]) 08/24/2016  .  Essential hypertension 08/24/2016  . Acute ischemic stroke ([place])   . Protein-calorie malnutrition, severe 12/24/2014  . Homelessness 12/23/2014  . Hypertensive urgency 12/23/2014  . DM (diabetes mellitus) ([place]) 12/23/2014  . Intermittent palpitations 12/23/2014  . Tobacco abuse 12/23/2014  . Pleuritic chest pain 12/23/2014  . Abnormal EKG 12/23/2014  . Type II diabetes mellitus with renal manifestations [facility])     Past Surgical History:  Procedure Laterality Date  . NO PAST SURGERIES         Home Medications    Prior to Admission medications   Medication Sig Start Date End Date Taking? Authorizing Provider  aspirin 325 MG tablet Take 1 tablet (325 mg total) by mouth daily. 03/08/17  Yes Georgette Shell, MD  atorvastatin (LIPITOR) 40 MG tablet Take 1 tablet (40 mg total) by mouth daily. 01/26/17  Yes Cardama, Grayce Sessions, MD  ferrous sulfate 325 (65 FE) MG tablet Take 1 tablet (325 mg total) daily by mouth. Patient taking differently: Take 325 mg by mouth daily as needed (low iron).  11/23/16  Yes Ward, Delice Bison, DO  lisinopril (PRINIVIL,ZESTRIL) 40 MG tablet Take 1 tablet (40 mg total) by mouth daily. 01/26/17  Yes Cardama, Grayce Sessions, MD  metoprolol tartrate (LOPRESSOR) 25 MG tablet Take 0.5 tablets (12.5 mg total) by mouth 2 (two) times daily. 03/07/17  Yes Georgette Shell, MD  blood glucose meter kit and supplies KIT Dispense based on patient and insurance preference. Use up to four times daily as directed. (FOR  ICD-9 250.00, 250.01). 11/22/16   Velvet Bathe, MD  insulin aspart protamine- aspart (NOVOLOG MIX 70/30) (70-30) 100 UNIT/ML injection Inject 0.3 mLs (30 Units total) into the skin 2 (two) times daily with a meal. 03/28/17   Dorie Rank, MD    Family History Family History  Problem Relation Age of Onset  . Diabetes Mellitus II Mother   . Diabetes Mellitus II Father     Social History Social History   Tobacco Use  . Smoking status: Current Every Day  Smoker    Packs/day: 0.10    Years: 40.00    Pack years: 4.00    Types: Cigarettes  . Smokeless tobacco: Never Used  Substance Use Topics  . Alcohol use: Yes    Alcohol/week: 1.2 oz    Types: 2 Cans of beer per week  . Drug use: No    Comment: 12/23/2014 "stopped ~ 10 yrs ago"     Allergies   Ibuprofen and Tylenol [acetaminophen]   Review of Systems Review of Systems  All other systems reviewed and are negative.    Physical Exam Updated Vital Signs BP (!) 173/89   Pulse 86   Temp 98.2 F (36.8 C) (Oral)   Resp 20   SpO2 100%   Physical Exam  Constitutional: No distress.  Disheveled, unkempt  HENT:  Head: Normocephalic and atraumatic.  Right Ear: External ear normal.  Left Ear: External ear normal.  Eyes: Conjunctivae are normal. Right eye exhibits no discharge. Left eye exhibits no discharge. No scleral icterus.  Neck: Neck supple. No tracheal deviation present.  Cardiovascular: Normal rate, regular rhythm and intact distal pulses.  Pulmonary/Chest: Effort normal and breath sounds normal. No stridor. No respiratory distress. He has no wheezes. He has no rales.  Abdominal: Soft. Bowel sounds are normal. He exhibits no distension. There is no tenderness. There is no rebound and no guarding.  Musculoskeletal: He exhibits no edema or tenderness.  Neurological: He is alert. He has normal strength. No cranial nerve deficit (no facial droop, extraocular movements intact, no slurred speech) or sensory deficit. He exhibits normal muscle tone. He displays no seizure activity. Coordination normal.  Skin: Skin is warm and dry. No rash noted.  Psychiatric: He has a normal mood and affect.  Nursing note and vitals reviewed.    ED Treatments / Results  Labs (all labs ordered are listed, but only abnormal results are displayed) Labs Reviewed  CBG MONITORING, ED - Abnormal; Notable for the following components:      Result Value   Glucose-Capillary >600 (*)    All other  components within normal limits  I-STAT CHEM 8, ED - Abnormal; Notable for the following components:   Chloride 100 (*)    Creatinine, Ser 0.60 (*)    Glucose, Bld 665 (*)    Calcium, Ion 1.08 (*)    Hemoglobin 9.5 (*)    HCT 28.0 (*)    All other components within normal limits  CBG MONITORING, ED - Abnormal; Notable for the following components:   Glucose-Capillary 502 (*)    All other components within normal limits      Radiology Dg Chest 2 View  Result Date: 03/27/2017 CLINICAL DATA:  Acute onset of hyperglycemia and generalized chest pain. Leukocytosis. EXAM: CHEST - 2 VIEW COMPARISON:  Chest radiograph performed 02/11/2017 FINDINGS: The lungs are well-aerated and clear. There is no evidence of focal opacification, pleural effusion or pneumothorax. The heart is normal in size; the mediastinal contour is within normal  limits. No acute osseous abnormalities are seen. IMPRESSION: No acute cardiopulmonary process seen. Electronically Signed   By: Garald Balding M.D.   On: 03/27/2017 22:45    Procedures Procedures (including critical care time)  Medications Ordered in ED Medications  insulin aspart protamine- aspart (NOVOLOG MIX 70/30) injection 30 Units (30 Units Subcutaneous Given 03/28/17 2004)  sodium chloride 0.9 % bolus 1,000 mL (0 mLs Intravenous Stopped 03/28/17 1945)  insulin aspart (novoLOG) injection 10 Units (10 Units Intravenous Given 03/28/17 1902)     Initial Impression / Assessment and Plan / ED Course  I have reviewed the triage vital signs and the nursing notes.  Pertinent labs & imaging results that were available during my care of the patient were reviewed by me and considered in my medical decision making (see chart for details).  Clinical Course as of Mar 28 2113  Wed Mar 28, 2017  1935 Hyperglycemia without DKA  [JK]    Clinical Course User Index [JK] Dorie Rank, MD      Patient presented to the emergency room for recurrent hyperglycemia.  No  evidence of diabetic ketoacidosis.  Patient was given a dose of his insulin and fluids.  I suspect his hyperglycemia is related to medical noncompliance.  Final Clinical Impressions(s) / ED Diagnoses   Final diagnoses:  Hyperglycemia  Essential hypertension    ED Discharge Orders        Ordered    insulin aspart protamine- aspart (NOVOLOG MIX 70/30) (70-30) 100 UNIT/ML injection  2 times daily with meals     03/28/17 2112      Dorie Rank, MD 03/28/17 2117

## 2017-03-28 NOTE — ED Triage Notes (Signed)
Pt asked police to called EMS d/t feeling his blood sugar was high, pt states he felt dizzy.

## 2017-03-28 NOTE — Discharge Instructions (Signed)
Please read and follow all provided instructions.  Your diagnoses today include:  1. Hyperglycemia without ketosis   2. Hyperkalemia     Tests performed today include:  Blood counts and electrolytes-very high blood sugar and elevated potassium  Chest x-ray and urine test-no signs of infection  Vital signs. See below for your results today.   Medications prescribed:   None  Take any prescribed medications only as directed.  Home care instructions:  Follow any educational materials contained in this packet.  Please continue taking your insulin to help prevent episodes of high blood sugar.  Follow-up instructions: Please follow-up with your primary care provider in the next 2 days for further evaluation of your symptoms.   Return instructions:   Please return to the Emergency Department if you experience worsening symptoms.   Please return if you have any other emergent concerns.  Additional Information:  Your vital signs today were: BP (!) 150/82    Pulse 62    Temp 99.4 F (37.4 C) (Oral)    Resp 13    SpO2 100%  If your blood pressure (BP) was elevated above 135/85 this visit, please have this repeated by your doctor within one month. --------------

## 2017-03-28 NOTE — ED Triage Notes (Signed)
He arrives in no distress; c/o hyperglycemia. He is in no distress.

## 2017-03-28 NOTE — Discharge Instructions (Signed)
Make Sure to take your insulin as prescribed

## 2017-03-29 NOTE — ED Notes (Signed)
Pt is here for blood sugar and is eating popcorn and sweet drinks.

## 2017-03-30 ENCOUNTER — Emergency Department (HOSPITAL_COMMUNITY): Admission: EM | Admit: 2017-03-30 | Discharge: 2017-03-30 | Discharge status: Against Medical Advice | Payer: Self-pay

## 2017-03-30 NOTE — ED Notes (Signed)
Called. No response

## 2017-03-30 NOTE — ED Notes (Signed)
Pt called. No response.

## 2017-04-02 ENCOUNTER — Encounter (HOSPITAL_COMMUNITY): Payer: Self-pay

## 2017-04-02 ENCOUNTER — Other Ambulatory Visit: Payer: Self-pay

## 2017-04-02 ENCOUNTER — Emergency Department (HOSPITAL_COMMUNITY)
Admission: EM | Admit: 2017-04-02 | Discharge: 2017-04-03 | Disposition: A | Discharge status: Home / Self Care | Payer: Self-pay | Attending: Emergency Medicine | Admitting: Emergency Medicine

## 2017-04-02 DIAGNOSIS — Z9119 Patient's noncompliance with other medical treatment and regimen: Secondary | ICD-10-CM | POA: Insufficient documentation

## 2017-04-02 DIAGNOSIS — I10 Essential (primary) hypertension: Secondary | ICD-10-CM | POA: Insufficient documentation

## 2017-04-02 DIAGNOSIS — Z7982 Long term (current) use of aspirin: Secondary | ICD-10-CM | POA: Insufficient documentation

## 2017-04-02 DIAGNOSIS — Z794 Long term (current) use of insulin: Secondary | ICD-10-CM | POA: Insufficient documentation

## 2017-04-02 DIAGNOSIS — E1165 Type 2 diabetes mellitus with hyperglycemia: Secondary | ICD-10-CM | POA: Insufficient documentation

## 2017-04-02 DIAGNOSIS — F17210 Nicotine dependence, cigarettes, uncomplicated: Secondary | ICD-10-CM | POA: Insufficient documentation

## 2017-04-02 DIAGNOSIS — Z9114 Patient's other noncompliance with medication regimen: Secondary | ICD-10-CM

## 2017-04-02 DIAGNOSIS — Z79899 Other long term (current) drug therapy: Secondary | ICD-10-CM | POA: Insufficient documentation

## 2017-04-02 DIAGNOSIS — R739 Hyperglycemia, unspecified: Secondary | ICD-10-CM

## 2017-04-02 LAB — BASIC METABOLIC PANEL
ANION GAP: 12 (ref 5–15)
BUN: 14 mg/dL (ref 6–20)
CALCIUM: 9.2 mg/dL (ref 8.9–10.3)
CO2: 27 mmol/L (ref 22–32)
Chloride: 83 mmol/L — ABNORMAL LOW (ref 101–111)
Creatinine, Ser: 1.39 mg/dL — ABNORMAL HIGH (ref 0.61–1.24)
GFR calc non Af Amer: 56 mL/min — ABNORMAL LOW (ref >60)
Glucose, Bld: 857 mg/dL (ref 65–99)
Potassium: 3.9 mmol/L (ref 3.5–5.1)
SODIUM: 122 mmol/L — AB (ref 135–145)

## 2017-04-02 LAB — CBG MONITORING, ED

## 2017-04-02 MED ORDER — SODIUM CHLORIDE 0.9 % IV BOLUS (SEPSIS)
3000.0000 mL | Freq: Once | INTRAVENOUS | Status: AC
  Administered 2017-04-02: 3000 mL via INTRAVENOUS

## 2017-04-02 MED ORDER — INSULIN ASPART PROT & ASPART (70-30 MIX) 100 UNIT/ML ~~LOC~~ SUSP
30.0000 [IU] | Freq: Once | SUBCUTANEOUS | Status: AC
  Administered 2017-04-02: 30 [IU] via SUBCUTANEOUS
  Filled 2017-04-02: qty 10

## 2017-04-02 NOTE — ED Triage Notes (Signed)
Per [facility], the mother of his children called EMS because his sugar was high. CBG read high on the machine.

## 2017-04-02 NOTE — ED Notes (Signed)
Melvenia BeamShari, PA notified of glucose.

## 2017-04-02 NOTE — ED Provider Notes (Signed)
[facility] EMERGENCY DEPARTMENT Provider Note   CSN: [id number] Arrival date & time: 04/02/17  2206     History   Chief Complaint Chief Complaint  Patient presents with  . Hyperglycemia    HPI Mike [name] is a 57 y.o. male.  Patient well known to the emergency department returns with concern for high blood sugar. He states he took his insulin this morning but feels his blood sugar is out of control. He has a history of medication noncompliance and homelessness. He denies nausea or vomiting. No pain complaints.   The history is provided by the patient. No language interpreter was used.  Hyperglycemia  Associated symptoms: polyuria   Associated symptoms: no fever     Past Medical History:  Diagnosis Date  . Anxiety   . Anxiety   . Chronic lower back pain   . Depression   . DKA (diabetic ketoacidoses) ([place]) 07/30/2016  . Hyperlipemia   . Hypertension   . Migraine    "last one was ~ 4 yr ago" (12/23/2014)  . Seizures ([place])    "related to pills for anxiety; if I don't take the pills I'm suppose to take I'll have them" (12/23/2014)  . Type II diabetes mellitus [facility])     Patient Active Problem List   Diagnosis Date Noted  . AKI (acute kidney injury) ([place]) 03/04/2017  . Malnutrition of moderate degree 02/08/2017  . DKA (diabetic ketoacidoses) ([place]) 02/07/2017  . History of stroke 02/07/2017  . Hyperphosphatemia 12/13/2016  . Evaluation by psychiatric service required 11/30/2016  . Lactic acidosis 11/18/2016  . Normocytic anemia 11/06/2016  . HLD (hyperlipidemia) 11/06/2016  . Adjustment disorder with other symptoms 11/05/2016  . Uncontrolled type 2 diabetes mellitus with hyperosmolar nonketotic hyperglycemia ([place]) 08/24/2016  . Essential hypertension 08/24/2016  . Acute ischemic stroke ([place])   . Protein-calorie malnutrition, severe 12/24/2014  . Homelessness 12/23/2014  . Hypertensive urgency 12/23/2014  . DM (diabetes mellitus) ([place])  12/23/2014  . Intermittent palpitations 12/23/2014  . Tobacco abuse 12/23/2014  . Pleuritic chest pain 12/23/2014  . Abnormal EKG 12/23/2014  . Type II diabetes mellitus with renal manifestations [facility])     Past Surgical History:  Procedure Laterality Date  . NO PAST SURGERIES         Home Medications    Prior to Admission medications   Medication Sig Start Date End Date Taking? Authorizing Provider  aspirin 325 MG tablet Take 1 tablet (325 mg total) by mouth daily. 03/08/17   Georgette Shell, MD  atorvastatin (LIPITOR) 40 MG tablet Take 1 tablet (40 mg total) by mouth daily. 01/26/17   Cardama, Grayce Sessions, MD  blood glucose meter kit and supplies KIT Dispense based on patient and insurance preference. Use up to four times daily as directed. (FOR ICD-9 250.00, 250.01). 11/22/16   Velvet Bathe, MD  ferrous sulfate 325 (65 FE) MG tablet Take 1 tablet (325 mg total) daily by mouth. Patient taking differently: Take 325 mg by mouth daily as needed (low iron).  11/23/16   Ward, Delice Bison, DO  insulin aspart protamine- aspart (NOVOLOG MIX 70/30) (70-30) 100 UNIT/ML injection Inject 0.3 mLs (30 Units total) into the skin 2 (two) times daily with a meal. 03/28/17   Dorie Rank, MD  lisinopril (PRINIVIL,ZESTRIL) 40 MG tablet Take 1 tablet (40 mg total) by mouth daily. 01/26/17   Fatima Blank, MD  metoprolol tartrate (LOPRESSOR) 25 MG tablet Take 0.5 tablets (12.5 mg total) by mouth 2 (two)  times daily. 03/07/17   Georgette Shell, MD    Family History Family History  Problem Relation Age of Onset  . Diabetes Mellitus II Mother   . Diabetes Mellitus II Father     Social History Social History   Tobacco Use  . Smoking status: Current Every Day Smoker    Packs/day: 0.10    Years: 40.00    Pack years: 4.00    Types: Cigarettes  . Smokeless tobacco: Never Used  Substance Use Topics  . Alcohol use: Yes    Alcohol/week: 1.2 oz    Types: 2 Cans of beer per week  . Drug  use: No    Comment: 12/23/2014 "stopped ~ 10 yrs ago"     Allergies   Ibuprofen and Tylenol [acetaminophen]   Review of Systems Review of Systems  Constitutional: Negative for chills and fever.  HENT: Negative.   Respiratory: Negative.   Cardiovascular: Negative.   Gastrointestinal: Negative.   Endocrine: Positive for polyuria.  Musculoskeletal: Negative.   Skin: Negative.   Neurological: Negative.      Physical Exam Updated Vital Signs BP (!) 166/97   Pulse 73   Temp 98.2 F (36.8 C) (Oral)   Resp 19   Ht _0  (1.93 m)   Wt 77.1 kg (170 lb)   BMI 20.69 kg/m   Physical Exam  Constitutional: He is oriented to person, place, and time. He appears well-developed and well-nourished.  Disheveled, poor hygiene.   HENT:  Head: Normocephalic.  Neck: Normal range of motion. Neck supple.  Cardiovascular: Normal rate and regular rhythm.  No murmur heard. Pulmonary/Chest: Effort normal and breath sounds normal. He has no wheezes. He has no rales.  Abdominal: Soft. Bowel sounds are normal. There is no tenderness. There is no rebound and no guarding.  Musculoskeletal: Normal range of motion.  Neurological: He is alert and oriented to person, place, and time.  Skin: Skin is warm and dry. No rash noted.  Psychiatric: He has a normal mood and affect.     ED Treatments / Results  Labs (all labs ordered are listed, but only abnormal results are displayed) Labs Reviewed  BASIC METABOLIC PANEL  CBG MONITORING, ED    EKG  EKG Interpretation None       Radiology No results found.  Procedures Procedures (including critical care time)  Medications Ordered in ED Medications - No data to display   Initial Impression / Assessment and Plan / ED Course  I have reviewed the triage vital signs and the nursing notes.  Pertinent labs & imaging results that were available during my care of the patient were reviewed by me and considered in my medical decision making (see  chart for details).     Patient's CBG "high". BMET pending. IV fluids ordered. He is given his regular SQ dose on 70/30 (30 units). He appears comfortable.   After fluids and insulin dose patient's CBG is 373. No vomiting. No evidence of DKA. Patient can be discharged home.   Final Clinical Impressions(s) / ED Diagnoses   Final diagnoses:  None   1. Hyperglycemia 2. Chronic medication noncompliance ED Discharge Orders    None       Charlann Lange, Hershal Coria 04/03/17 0138    Noemi Chapel, MD 04/04/17 1014

## 2017-04-02 NOTE — ED Notes (Signed)
ED Provider at bedside. 

## 2017-04-03 ENCOUNTER — Inpatient Hospital Stay (INDEPENDENT_AMBULATORY_CARE_PROVIDER_SITE_OTHER): Payer: Self-pay | Admitting: Physician Assistant

## 2017-04-03 ENCOUNTER — Other Ambulatory Visit: Payer: Self-pay

## 2017-04-03 ENCOUNTER — Encounter (HOSPITAL_COMMUNITY): Payer: Self-pay | Admitting: Emergency Medicine

## 2017-04-03 ENCOUNTER — Emergency Department (HOSPITAL_COMMUNITY)
Admission: EM | Admit: 2017-04-03 | Discharge: 2017-04-03 | Disposition: A | Discharge status: Home / Self Care | Payer: Self-pay | Attending: Emergency Medicine | Admitting: Emergency Medicine

## 2017-04-03 DIAGNOSIS — E1165 Type 2 diabetes mellitus with hyperglycemia: Secondary | ICD-10-CM | POA: Insufficient documentation

## 2017-04-03 DIAGNOSIS — Z5321 Procedure and treatment not carried out due to patient leaving prior to being seen by health care provider: Secondary | ICD-10-CM | POA: Insufficient documentation

## 2017-04-03 DIAGNOSIS — F17210 Nicotine dependence, cigarettes, uncomplicated: Secondary | ICD-10-CM | POA: Insufficient documentation

## 2017-04-03 DIAGNOSIS — R739 Hyperglycemia, unspecified: Secondary | ICD-10-CM

## 2017-04-03 DIAGNOSIS — Z79899 Other long term (current) drug therapy: Secondary | ICD-10-CM | POA: Insufficient documentation

## 2017-04-03 DIAGNOSIS — I10 Essential (primary) hypertension: Secondary | ICD-10-CM | POA: Insufficient documentation

## 2017-04-03 LAB — CBG MONITORING, ED
GLUCOSE-CAPILLARY: 373 mg/dL — AB (ref 65–99)
GLUCOSE-CAPILLARY: 95 mg/dL (ref 65–99)
Glucose-Capillary: 84 mg/dL (ref 65–99)
Glucose-Capillary: 591 mg/dL (ref 65–99)

## 2017-04-03 NOTE — ED Provider Notes (Signed)
[facility] DEPT Provider Note   CSN: [id number] Arrival date & time: 04/03/17  2458     History   Chief Complaint Chief Complaint  Patient presents with  . Hyperglycemia    HPI Mike [name] is a 57 y.o. male.  HPI 57 yo male with recurrent visits to the ER presents complaining of hyperglycemia. Just seen in the ER '@1'$  am. Treated with SQ insulin at that time. Corrected sodium was 134 at that time. No abd pain. Denies n/v. No fevers. No weakness of arms or legs. No chest pain. No other complaints. Blood sugar in the 80s on arrival to the ER   Past Medical History:  Diagnosis Date  . Anxiety   . Anxiety   . Chronic lower back pain   . Depression   . DKA (diabetic ketoacidoses) ([place]) 07/30/2016  . Hyperlipemia   . Hypertension   . Migraine    "last one was ~ 4 yr ago" (12/23/2014)  . Seizures ([place])    "related to pills for anxiety; if I don't take the pills I'm suppose to take I'll have them" (12/23/2014)  . Type II diabetes mellitus [facility])     Patient Active Problem List   Diagnosis Date Noted  . AKI (acute kidney injury) ([place]) 03/04/2017  . Malnutrition of moderate degree 02/08/2017  . DKA (diabetic ketoacidoses) ([place]) 02/07/2017  . History of stroke 02/07/2017  . Hyperphosphatemia 12/13/2016  . Evaluation by psychiatric service required 11/30/2016  . Lactic acidosis 11/18/2016  . Normocytic anemia 11/06/2016  . HLD (hyperlipidemia) 11/06/2016  . Adjustment disorder with other symptoms 11/05/2016  . Uncontrolled type 2 diabetes mellitus with hyperosmolar nonketotic hyperglycemia ([place]) 08/24/2016  . Essential hypertension 08/24/2016  . Acute ischemic stroke ([place])   . Protein-calorie malnutrition, severe 12/24/2014  . Homelessness 12/23/2014  . Hypertensive urgency 12/23/2014  . DM (diabetes mellitus) ([place]) 12/23/2014  . Intermittent palpitations 12/23/2014  . Tobacco abuse 12/23/2014  . Pleuritic chest pain 12/23/2014  .  Abnormal EKG 12/23/2014  . Type II diabetes mellitus with renal manifestations [facility])     Past Surgical History:  Procedure Laterality Date  . NO PAST SURGERIES         Home Medications    Prior to Admission medications   Medication Sig Start Date End Date Taking? Authorizing Provider  aspirin 325 MG tablet Take 1 tablet (325 mg total) by mouth daily. 03/08/17   Georgette Shell, MD  atorvastatin (LIPITOR) 40 MG tablet Take 1 tablet (40 mg total) by mouth daily. 01/26/17   Cardama, Grayce Sessions, MD  blood glucose meter kit and supplies KIT Dispense based on patient and insurance preference. Use up to four times daily as directed. (FOR ICD-9 250.00, 250.01). 11/22/16   Velvet Bathe, MD  ferrous sulfate 325 (65 FE) MG tablet Take 1 tablet (325 mg total) daily by mouth. Patient taking differently: Take 325 mg by mouth daily as needed (low iron).  11/23/16   Ward, Delice Bison, DO  insulin aspart protamine- aspart (NOVOLOG MIX 70/30) (70-30) 100 UNIT/ML injection Inject 0.3 mLs (30 Units total) into the skin 2 (two) times daily with a meal. 03/28/17   Dorie Rank, MD  lisinopril (PRINIVIL,ZESTRIL) 40 MG tablet Take 1 tablet (40 mg total) by mouth daily. 01/26/17   Fatima Blank, MD  metoprolol tartrate (LOPRESSOR) 25 MG tablet Take 0.5 tablets (12.5 mg total) by mouth 2 (two) times daily. 03/07/17   Georgette Shell, MD    Family  History Family History  Problem Relation Age of Onset  . Diabetes Mellitus II Mother   . Diabetes Mellitus II Father     Social History Social History   Tobacco Use  . Smoking status: Current Every Day Smoker    Packs/day: 0.10    Years: 40.00    Pack years: 4.00    Types: Cigarettes  . Smokeless tobacco: Never Used  Substance Use Topics  . Alcohol use: Yes    Alcohol/week: 1.2 oz    Types: 2 Cans of beer per week  . Drug use: No    Comment: 12/23/2014 "stopped ~ 10 yrs ago"     Allergies   Ibuprofen and Tylenol  [acetaminophen]   Review of Systems Review of Systems  All other systems reviewed and are negative.    Physical Exam Updated Vital Signs BP (!) 144/76 (BP Location: Left Arm)   Pulse 76   Temp 98 F (36.7 C) (Oral)   Resp 18   SpO2 99%   Physical Exam  Constitutional: He is oriented to person, place, and time. He appears well-developed and well-nourished.  HENT:  Head: Normocephalic.  Eyes: EOM are normal.  Neck: Normal range of motion.  Cardiovascular: Normal rate and regular rhythm.  Pulmonary/Chest: Effort normal and breath sounds normal.  Abdominal: Soft. He exhibits no distension.  Musculoskeletal: Normal range of motion.  Neurological: He is alert and oriented to person, place, and time.  Psychiatric: He has a normal mood and affect.  Nursing note and vitals reviewed.    ED Treatments / Results  Labs (all labs ordered are listed, but only abnormal results are displayed) Labs Reviewed  BASIC METABOLIC PANEL  CBC  URINALYSIS, ROUTINE W REFLEX MICROSCOPIC  CBG MONITORING, ED  CBG MONITORING, ED    EKG  EKG Interpretation None       Radiology No results found.  Procedures Procedures (including critical care time)  Medications Ordered in ED Medications - No data to display   Initial Impression / Assessment and Plan / ED Course  I have reviewed the triage vital signs and the nursing notes.  Pertinent labs & imaging results that were available during my care of the patient were reviewed by me and considered in my medical decision making (see chart for details).       Final Clinical Impressions(s) / ED Diagnoses   Final diagnoses:  Hyperglycemia    ED Discharge Orders    None       Jola Schmidt, MD 04/03/17 [phone]

## 2017-04-03 NOTE — ED Triage Notes (Signed)
Pt BIB [facility] for hyperglycemia, EMS VS: 124/78, hr 88, spo2 98%, CBG "high". EMS reports pt was drinking a coke when they arrived to pick him up. Pt a/o, and ambulatory.

## 2017-04-03 NOTE — ED Triage Notes (Signed)
Patient here via EMS with complaints of hyperglycemia. Homeless.

## 2017-04-03 NOTE — ED Notes (Signed)
Pt given discharge instructions and reviewed them. Pt aware bus will start running in am.

## 2017-04-03 NOTE — ED Notes (Signed)
CBG 373 

## 2017-04-04 ENCOUNTER — Emergency Department (HOSPITAL_COMMUNITY)
Admission: EM | Admit: 2017-04-04 | Discharge: 2017-04-04 | Discharge status: Against Medical Advice | Payer: Self-pay | Attending: Emergency Medicine | Admitting: Emergency Medicine

## 2017-04-04 NOTE — ED Notes (Signed)
Pt called to room  Pt asleep in lobby  Woke pt by NT and she was told he did not want to be seen

## 2017-04-19 ENCOUNTER — Encounter (HOSPITAL_COMMUNITY): Payer: Self-pay | Admitting: *Deleted

## 2017-04-19 DIAGNOSIS — Z7982 Long term (current) use of aspirin: Secondary | ICD-10-CM | POA: Insufficient documentation

## 2017-04-19 DIAGNOSIS — E1165 Type 2 diabetes mellitus with hyperglycemia: Secondary | ICD-10-CM | POA: Insufficient documentation

## 2017-04-19 DIAGNOSIS — Z79899 Other long term (current) drug therapy: Secondary | ICD-10-CM | POA: Insufficient documentation

## 2017-04-19 DIAGNOSIS — F17210 Nicotine dependence, cigarettes, uncomplicated: Secondary | ICD-10-CM | POA: Insufficient documentation

## 2017-04-19 DIAGNOSIS — Z794 Long term (current) use of insulin: Secondary | ICD-10-CM | POA: Insufficient documentation

## 2017-04-19 DIAGNOSIS — I10 Essential (primary) hypertension: Secondary | ICD-10-CM | POA: Insufficient documentation

## 2017-04-19 LAB — I-STAT CHEM 8, ED
BUN: 20 mg/dL (ref 6–20)
CHLORIDE: 98 mmol/L — AB (ref 101–111)
Calcium, Ion: 1.23 mmol/L (ref 1.15–1.40)
Creatinine, Ser: 0.7 mg/dL (ref 0.61–1.24)
Glucose, Bld: 544 mg/dL (ref 65–99)
HEMATOCRIT: 34 % — AB (ref 39.0–52.0)
Hemoglobin: 11.6 g/dL — ABNORMAL LOW (ref 13.0–17.0)
POTASSIUM: 4.1 mmol/L (ref 3.5–5.1)
SODIUM: 134 mmol/L — AB (ref 135–145)
TCO2: 30 mmol/L (ref 22–32)

## 2017-04-19 MED ORDER — INSULIN ASPART 100 UNIT/ML ~~LOC~~ SOLN
20.0000 [IU] | Freq: Once | SUBCUTANEOUS | Status: AC
  Administered 2017-04-20: 20 [IU] via SUBCUTANEOUS
  Filled 2017-04-19: qty 1

## 2017-04-19 NOTE — ED Triage Notes (Signed)
Per [facility]-called by [employer] for wellness check due to patient urinating on neighbors door-CBG read high-was treated recently for same but refuses to get insulin-states he had a loaf of bread and sandwich meat with him-patient urinated on self

## 2017-04-19 NOTE — ED Notes (Signed)
Pt was seen urinating in the lobby. Pt was instructed not to. Pt was not responsive to redirection

## 2017-04-20 ENCOUNTER — Emergency Department (HOSPITAL_COMMUNITY)
Admission: EM | Admit: 2017-04-20 | Discharge: 2017-04-20 | Disposition: A | Discharge status: Home / Self Care | Payer: Self-pay | Attending: Emergency Medicine | Admitting: Emergency Medicine

## 2017-04-20 ENCOUNTER — Emergency Department (HOSPITAL_COMMUNITY)
Admission: EM | Admit: 2017-04-20 | Discharge: 2017-04-21 | Disposition: A | Discharge status: Home / Self Care | Payer: Self-pay | Attending: Emergency Medicine | Admitting: Emergency Medicine

## 2017-04-20 ENCOUNTER — Other Ambulatory Visit: Payer: Self-pay

## 2017-04-20 ENCOUNTER — Encounter (HOSPITAL_COMMUNITY): Payer: Self-pay

## 2017-04-20 DIAGNOSIS — Z79899 Other long term (current) drug therapy: Secondary | ICD-10-CM | POA: Insufficient documentation

## 2017-04-20 DIAGNOSIS — Z794 Long term (current) use of insulin: Secondary | ICD-10-CM | POA: Insufficient documentation

## 2017-04-20 DIAGNOSIS — R739 Hyperglycemia, unspecified: Secondary | ICD-10-CM

## 2017-04-20 DIAGNOSIS — I10 Essential (primary) hypertension: Secondary | ICD-10-CM | POA: Insufficient documentation

## 2017-04-20 DIAGNOSIS — F17210 Nicotine dependence, cigarettes, uncomplicated: Secondary | ICD-10-CM | POA: Insufficient documentation

## 2017-04-20 DIAGNOSIS — Z7982 Long term (current) use of aspirin: Secondary | ICD-10-CM | POA: Insufficient documentation

## 2017-04-20 DIAGNOSIS — Z590 Homelessness: Secondary | ICD-10-CM | POA: Insufficient documentation

## 2017-04-20 DIAGNOSIS — E1165 Type 2 diabetes mellitus with hyperglycemia: Secondary | ICD-10-CM | POA: Insufficient documentation

## 2017-04-20 DIAGNOSIS — Z8673 Personal history of transient ischemic attack (TIA), and cerebral infarction without residual deficits: Secondary | ICD-10-CM | POA: Insufficient documentation

## 2017-04-20 LAB — CBC
HEMATOCRIT: 31.6 % — AB (ref 39.0–52.0)
Hemoglobin: 10 g/dL — ABNORMAL LOW (ref 13.0–17.0)
MCH: 26.7 pg (ref 26.0–34.0)
MCHC: 31.6 g/dL (ref 30.0–36.0)
MCV: 84.3 fL (ref 78.0–100.0)
Platelets: 374 10*3/uL (ref 150–400)
RBC: 3.75 MIL/uL — ABNORMAL LOW (ref 4.22–5.81)
RDW: 13.7 % (ref 11.5–15.5)
WBC: 5 10*3/uL (ref 4.0–10.5)

## 2017-04-20 LAB — URINALYSIS, ROUTINE W REFLEX MICROSCOPIC
BACTERIA UA: NONE SEEN
Bilirubin Urine: NEGATIVE
Glucose, UA: >=500 mg/dL — AB
KETONES UR: NEGATIVE mg/dL
Leukocytes, UA: NEGATIVE
Nitrite: NEGATIVE
PH: 6 (ref 5.0–8.0)
Protein, ur: NEGATIVE mg/dL
SQUAMOUS EPITHELIAL / LPF: NONE SEEN
Specific Gravity, Urine: 1.027 (ref 1.005–1.030)

## 2017-04-20 LAB — BASIC METABOLIC PANEL
Anion gap: 11 (ref 5–15)
BUN: 24 mg/dL — ABNORMAL HIGH (ref 6–20)
CHLORIDE: 96 mmol/L — AB (ref 101–111)
CO2: 26 mmol/L (ref 22–32)
Calcium: 9.6 mg/dL (ref 8.9–10.3)
Creatinine, Ser: 0.98 mg/dL (ref 0.61–1.24)
GFR calc Af Amer: >60 mL/min (ref >60)
GFR calc non Af Amer: >60 mL/min (ref >60)
GLUCOSE: 1066 mg/dL — AB (ref 65–99)
POTASSIUM: 5.4 mmol/L — AB (ref 3.5–5.1)
Sodium: 133 mmol/L — ABNORMAL LOW (ref 135–145)

## 2017-04-20 LAB — CBG MONITORING, ED
Glucose-Capillary: 401 mg/dL — ABNORMAL HIGH (ref 65–99)
Glucose-Capillary: >600 mg/dL (ref 65–99)

## 2017-04-20 MED ORDER — SODIUM CHLORIDE 0.9 % IV BOLUS
1000.0000 mL | Freq: Once | INTRAVENOUS | Status: AC
  Administered 2017-04-20: 1000 mL via INTRAVENOUS

## 2017-04-20 MED ORDER — INSULIN ASPART 100 UNIT/ML ~~LOC~~ SOLN
10.0000 [IU] | Freq: Once | SUBCUTANEOUS | Status: AC
  Administered 2017-04-20: 10 [IU] via SUBCUTANEOUS
  Filled 2017-04-20: qty 1

## 2017-04-20 NOTE — ED Notes (Signed)
CRITICAL VALUE STICKER  CRITICAL VALUE: Glu 1066  RECEIVER (on-site recipient of call): Leta JunglingJake T RN  DATE & TIME NOTIFIED: 04/20/17 1015p  MESSENGER (representative from lab): Alona BeneJoyce  MD NOTIFIED: Fredderick PhenixBelfi MD  TIME OF NOTIFICATION: 10/16p  RESPONSE: see orders

## 2017-04-20 NOTE — Discharge Instructions (Addendum)
Take your medicine as prescribed.  See your doctor if needed for help with medications or other systems.

## 2017-04-20 NOTE — ED Notes (Signed)
Bed: [facility]PT4 Expected date:  Expected time:  Means of arrival:  Comments: 

## 2017-04-20 NOTE — ED Provider Notes (Signed)
[facility] DEPT Provider Note   CSN: [id number] Arrival date & time: 04/19/17  1638     History   Chief Complaint Chief Complaint  Patient presents with  . Hyperglycemia    HPI Mike [name] is a 57 y.o. male.  Patient states he is here because he is hungry.  He was apparently here earlier labs ordered and he was unable be found.  Subsequently was found and placed in exam room at around 7 AM.  Currently has no other complaints.  Patient frequently comes to the ED with hyperglycemia.  He does not express any other needs or concerns  HPI  Past Medical History:  Diagnosis Date  . Anxiety   . Anxiety   . Chronic lower back pain   . Depression   . DKA (diabetic ketoacidoses) ([place]) 07/30/2016  . Hyperlipemia   . Hypertension   . Migraine    "last one was ~ 4 yr ago" (12/23/2014)  . Seizures ([place])    "related to pills for anxiety; if I don't take the pills I'm suppose to take I'll have them" (12/23/2014)  . Type II diabetes mellitus [facility])     Patient Active Problem List   Diagnosis Date Noted  . AKI (acute kidney injury) ([place]) 03/04/2017  . Malnutrition of moderate degree 02/08/2017  . DKA (diabetic ketoacidoses) ([place]) 02/07/2017  . History of stroke 02/07/2017  . Hyperphosphatemia 12/13/2016  . Evaluation by psychiatric service required 11/30/2016  . Lactic acidosis 11/18/2016  . Normocytic anemia 11/06/2016  . HLD (hyperlipidemia) 11/06/2016  . Adjustment disorder with other symptoms 11/05/2016  . Uncontrolled type 2 diabetes mellitus with hyperosmolar nonketotic hyperglycemia ([place]) 08/24/2016  . Essential hypertension 08/24/2016  . Acute ischemic stroke ([place])   . Protein-calorie malnutrition, severe 12/24/2014  . Homelessness 12/23/2014  . Hypertensive urgency 12/23/2014  . DM (diabetes mellitus) ([place]) 12/23/2014  . Intermittent palpitations 12/23/2014  . Tobacco abuse 12/23/2014  . Pleuritic chest pain 12/23/2014  . Abnormal EKG  12/23/2014  . Type II diabetes mellitus with renal manifestations [facility])     Past Surgical History:  Procedure Laterality Date  . NO PAST SURGERIES          Home Medications    Prior to Admission medications   Medication Sig Start Date End Date Taking? Authorizing Provider  aspirin 325 MG tablet Take 1 tablet (325 mg total) by mouth daily. 03/08/17   Georgette Shell, MD  atorvastatin (LIPITOR) 40 MG tablet Take 1 tablet (40 mg total) by mouth daily. 01/26/17   Cardama, Grayce Sessions, MD  blood glucose meter kit and supplies KIT Dispense based on patient and insurance preference. Use up to four times daily as directed. (FOR ICD-9 250.00, 250.01). 11/22/16   Velvet Bathe, MD  ferrous sulfate 325 (65 FE) MG tablet Take 1 tablet (325 mg total) daily by mouth. Patient taking differently: Take 325 mg by mouth daily as needed (low iron).  11/23/16   Ward, Delice Bison, DO  insulin aspart protamine- aspart (NOVOLOG MIX 70/30) (70-30) 100 UNIT/ML injection Inject 0.3 mLs (30 Units total) into the skin 2 (two) times daily with a meal. 03/28/17   Dorie Rank, MD  lisinopril (PRINIVIL,ZESTRIL) 40 MG tablet Take 1 tablet (40 mg total) by mouth daily. 01/26/17   Fatima Blank, MD  metoprolol tartrate (LOPRESSOR) 25 MG tablet Take 0.5 tablets (12.5 mg total) by mouth 2 (two) times daily. 03/07/17   Georgette Shell, MD    Family History  Family History  Problem Relation Age of Onset  . Diabetes Mellitus II Mother   . Diabetes Mellitus II Father     Social History Social History   Tobacco Use  . Smoking status: Current Every Day Smoker    Packs/day: 0.10    Years: 40.00    Pack years: 4.00    Types: Cigarettes  . Smokeless tobacco: Never Used  Substance Use Topics  . Alcohol use: Yes    Alcohol/week: 1.2 oz    Types: 2 Cans of beer per week  . Drug use: No    Comment: 12/23/2014 "stopped ~ 10 yrs ago"     Allergies   Ibuprofen and Tylenol [acetaminophen]   Review of  Systems Review of Systems  All other systems reviewed and are negative.    Physical Exam Updated Vital Signs BP (!) 175/83   Pulse (!) 55   Temp 98.1 F (36.7 C) (Oral)   Resp 18   SpO2 100%   Physical Exam  Constitutional: He is oriented to person, place, and time. He appears well-developed and well-nourished.  He appears under nourished, he is disheveled  HENT:  Head: Normocephalic and atraumatic.  Right Ear: External ear normal.  Left Ear: External ear normal.  Eyes: Pupils are equal, round, and reactive to light. Conjunctivae and EOM are normal.  Neck: Normal range of motion and phonation normal. Neck supple.  Cardiovascular: Normal rate.  Pulmonary/Chest: Effort normal. He exhibits no bony tenderness.  Musculoskeletal: He exhibits edema (Bilateral lower legs). He exhibits no deformity.  Neurological: He is alert and oriented to person, place, and time. No cranial nerve deficit or sensory deficit. He exhibits normal muscle tone. Coordination normal.  Skin: Skin is warm, dry and intact.  Psychiatric: He has a normal mood and affect.  Nursing note and vitals reviewed.    ED Treatments / Results  Labs (all labs ordered are listed, but only abnormal results are displayed) Labs Reviewed  I-STAT CHEM 8, ED - Abnormal; Notable for the following components:      Result Value   Sodium 134 (*)    Chloride 98 (*)    Glucose, Bld 544 (*)    Hemoglobin 11.6 (*)    HCT 34.0 (*)    All other components within normal limits  CBG MONITORING, ED - Abnormal; Notable for the following components:   Glucose-Capillary 401 (*)    All other components within normal limits  BASIC METABOLIC PANEL    EKG None  Radiology No results found.  Procedures Procedures (including critical care time)  Medications Ordered in ED Medications  insulin aspart (novoLOG) injection 20 Units (has no administration in time range)     Initial Impression / Assessment and Plan / ED Course  I  have reviewed the triage vital signs and the nursing notes.  Pertinent labs & imaging results that were available during my care of the patient were reviewed by me and considered in my medical decision making (see chart for details).      Patient Vitals for the past 24 hrs:  BP Temp Temp src Pulse Resp SpO2  04/20/17 0715 (!) 175/83 98.1 F (36.7 C) Oral (!) 55 18 100 %  04/19/17 1759 (!) 177/95 98.1 F (36.7 C) Oral 87 18 100 %    7:28 AM Reevaluation with update and discussion. After initial assessment and treatment, an updated evaluation reveals no further complaints.[place] decision making-progressing and homelessness.  No indication for  acute metabolic instability.  Patient is chronically noncompliant.  Nursing Notes Reviewed/ Care Coordinated Applicable Imaging Reviewed Interpretation of Laboratory Data incorporated into ED treatment  The patient appears reasonably screened and/or stabilized for discharge and I doubt any other medical condition or other [facility] requiring further screening, evaluation, or treatment in the ED at this time prior to discharge.  Plan: Home Medications- usual; Home Treatments-low carbohydrate diet; return here if the recommended treatment, does not improve the symptoms; Recommended follow up-PCP follow-up as needed.      Final Clinical Impressions(s) / ED Diagnoses   Final diagnoses:  Hyperglycemia    ED Discharge Orders    None       Daleen Bo, MD 04/20/17 [phone]

## 2017-04-20 NOTE — ED Triage Notes (Signed)
Pt BIB [facility] for hyperglycemia from [facility]. EMS states that he was drinking sweet tea and eating without difficulty upon their arrival.

## 2017-04-20 NOTE — ED Notes (Signed)
Pt requested graham crackers, chicken noodle, and milk while getting vitals.

## 2017-04-20 NOTE — ED Provider Notes (Signed)
[facility] DEPT Provider Note   CSN: [id number] Arrival date & time: 04/20/17  2008     History   Chief Complaint Chief Complaint  Patient presents with  . Hyperglycemia    HPI Mike [name] is a 57 y.o. male.  Patient is a 57 year old male with a history of diabetes who presents with hyperglycemia.  He has frequent admissions to the ED for hyperglycemia.  He reports increased urinary frequency and thirst.  He denies any fever or other recent illnesses.  No nausea or vomiting.  He was seen here earlier this morning in the emergency room and treated for similar symptoms.     Past Medical History:  Diagnosis Date  . Anxiety   . Anxiety   . Chronic lower back pain   . Depression   . DKA (diabetic ketoacidoses) ([place]) 07/30/2016  . Hyperlipemia   . Hypertension   . Migraine    "last one was ~ 4 yr ago" (12/23/2014)  . Seizures ([place])    "related to pills for anxiety; if I don't take the pills I'm suppose to take I'll have them" (12/23/2014)  . Type II diabetes mellitus [facility])     Patient Active Problem List   Diagnosis Date Noted  . AKI (acute kidney injury) ([place]) 03/04/2017  . Malnutrition of moderate degree 02/08/2017  . DKA (diabetic ketoacidoses) ([place]) 02/07/2017  . History of stroke 02/07/2017  . Hyperphosphatemia 12/13/2016  . Evaluation by psychiatric service required 11/30/2016  . Lactic acidosis 11/18/2016  . Normocytic anemia 11/06/2016  . HLD (hyperlipidemia) 11/06/2016  . Adjustment disorder with other symptoms 11/05/2016  . Uncontrolled type 2 diabetes mellitus with hyperosmolar nonketotic hyperglycemia ([place]) 08/24/2016  . Essential hypertension 08/24/2016  . Acute ischemic stroke ([place])   . Protein-calorie malnutrition, severe 12/24/2014  . Homelessness 12/23/2014  . Hypertensive urgency 12/23/2014  . DM (diabetes mellitus) ([place]) 12/23/2014  . Intermittent palpitations 12/23/2014  . Tobacco abuse 12/23/2014  . Pleuritic  chest pain 12/23/2014  . Abnormal EKG 12/23/2014  . Type II diabetes mellitus with renal manifestations [facility])     Past Surgical History:  Procedure Laterality Date  . NO PAST SURGERIES          Home Medications    Prior to Admission medications   Medication Sig Start Date End Date Taking? Authorizing Provider  aspirin 325 MG tablet Take 1 tablet (325 mg total) by mouth daily. 03/08/17   Georgette Shell, MD  atorvastatin (LIPITOR) 40 MG tablet Take 1 tablet (40 mg total) by mouth daily. 01/26/17   Cardama, Grayce Sessions, MD  blood glucose meter kit and supplies KIT Dispense based on patient and insurance preference. Use up to four times daily as directed. (FOR ICD-9 250.00, 250.01). 11/22/16   Velvet Bathe, MD  ferrous sulfate 325 (65 FE) MG tablet Take 1 tablet (325 mg total) daily by mouth. Patient taking differently: Take 325 mg by mouth daily as needed (low iron).  11/23/16   Ward, Delice Bison, DO  insulin aspart protamine- aspart (NOVOLOG MIX 70/30) (70-30) 100 UNIT/ML injection Inject 0.3 mLs (30 Units total) into the skin 2 (two) times daily with a meal. 03/28/17   Dorie Rank, MD  lisinopril (PRINIVIL,ZESTRIL) 40 MG tablet Take 1 tablet (40 mg total) by mouth daily. 01/26/17   Fatima Blank, MD  metoprolol tartrate (LOPRESSOR) 25 MG tablet Take 0.5 tablets (12.5 mg total) by mouth 2 (two) times daily. 03/07/17   Georgette Shell, MD  Family History Family History  Problem Relation Age of Onset  . Diabetes Mellitus II Mother   . Diabetes Mellitus II Father     Social History Social History   Tobacco Use  . Smoking status: Current Every Day Smoker    Packs/day: 0.10    Years: 40.00    Pack years: 4.00    Types: Cigarettes  . Smokeless tobacco: Never Used  Substance Use Topics  . Alcohol use: Yes    Alcohol/week: 1.2 oz    Types: 2 Cans of beer per week  . Drug use: No    Comment: 12/23/2014 "stopped ~ 10 yrs ago"     Allergies   Ibuprofen and  Tylenol [acetaminophen]   Review of Systems Review of Systems  Constitutional: Negative for chills, diaphoresis, fatigue and fever.  HENT: Negative for congestion, rhinorrhea and sneezing.   Eyes: Negative.   Respiratory: Negative for cough, chest tightness and shortness of breath.   Cardiovascular: Negative for chest pain and leg swelling.  Gastrointestinal: Negative for abdominal pain, blood in stool, diarrhea, nausea and vomiting.  Endocrine: Positive for polydipsia and polyuria.  Genitourinary: Negative for difficulty urinating, flank pain, frequency and hematuria.  Musculoskeletal: Negative for arthralgias and back pain.  Skin: Negative for rash.  Neurological: Negative for dizziness, speech difficulty, weakness, numbness and headaches.     Physical Exam Updated Vital Signs BP (!) 199/92 (BP Location: Left Arm)   Pulse 74   Temp 98.6 F (37 C) (Oral)   Resp 18   Ht '6\' 1"'$  (1.854 m)   Wt 77.1 kg (170 lb)   SpO2 98%   BMI 22.43 kg/m   Physical Exam  Constitutional: He is oriented to person, place, and time. He appears well-developed.  Appears disheveled and smells of urine  HENT:  Head: Normocephalic and atraumatic.  Eyes: Pupils are equal, round, and reactive to light.  Neck: Normal range of motion. Neck supple.  Cardiovascular: Normal rate, regular rhythm and normal heart sounds.  Pulmonary/Chest: Effort normal and breath sounds normal. No respiratory distress. He has no wheezes. He has no rales. He exhibits no tenderness.  Abdominal: Soft. Bowel sounds are normal. There is no tenderness. There is no rebound and no guarding.  Musculoskeletal: Normal range of motion. He exhibits edema (Trace edema to lower extremities bilaterally).  Lymphadenopathy:    He has no cervical adenopathy.  Neurological: He is alert and oriented to person, place, and time.  Skin: Skin is warm and dry. No rash noted.  Psychiatric: He has a normal mood and affect.     ED Treatments /  Results  Labs (all labs ordered are listed, but only abnormal results are displayed) Labs Reviewed  BASIC METABOLIC PANEL - Abnormal; Notable for the following components:      Result Value   Sodium 133 (*)    Potassium 5.4 (*)    Chloride 96 (*)    Glucose, Bld 1,066 (*)    BUN 24 (*)    All other components within normal limits  CBC - Abnormal; Notable for the following components:   RBC 3.75 (*)    Hemoglobin 10.0 (*)    HCT 31.6 (*)    All other components within normal limits  URINALYSIS, ROUTINE W REFLEX MICROSCOPIC - Abnormal; Notable for the following components:   Color, Urine STRAW (*)    Glucose, UA >=500 (*)    Hgb urine dipstick SMALL (*)    All other components within normal limits  CBG  MONITORING, ED - Abnormal; Notable for the following components:   Glucose-Capillary >600 (*)    All other components within normal limits  CBG MONITORING, ED - Abnormal; Notable for the following components:   Glucose-Capillary >600 (*)    All other components within normal limits  CBG MONITORING, ED    EKG None  Radiology No results found.  Procedures Procedures (including critical care time)  Medications Ordered in ED Medications  dextrose 5 %-0.45 % sodium chloride infusion (has no administration in time range)  insulin regular (NOVOLIN R,HUMULIN R) 100 Units in sodium chloride 0.9 % 100 mL (1 Units/mL) infusion (has no administration in time range)  sodium chloride 0.9 % bolus 1,000 mL (has no administration in time range)  0.9 %  sodium chloride infusion (has no administration in time range)  sodium chloride 0.9 % bolus 1,000 mL (1,000 mLs Intravenous New Bag/Given 04/20/17 2245)  insulin aspart (novoLOG) injection 10 Units (10 Units Subcutaneous Given 04/20/17 2249)     Initial Impression / Assessment and Plan / ED Course  I have reviewed the triage vital signs and the nursing notes.  Pertinent labs & imaging results that were available during my care of the  patient were reviewed by me and considered in my medical decision making (see chart for details).     Pt presents with hyperglycemia.  No evidence of DKA.  Given that his glucose was over 1000, I started him on the glucostabilizer.  He was also given IVFs.  Care turned over to Dr. Leonides Schanz.  CRITICAL CARE Performed by: Malvin Johns Total critical care time: 50 minutes Critical care time was exclusive of separately billable procedures and treating other patients. Critical care was necessary to treat or prevent imminent or life-threatening deterioration. Critical care was time spent personally by me on the following activities: development of treatment plan with patient and/or surrogate as well as nursing, discussions with consultants, evaluation of patient's response to treatment, examination of patient, obtaining history from patient or surrogate, ordering and performing treatments and interventions, ordering and review of laboratory studies, ordering and review of radiographic studies, pulse oximetry and re-evaluation of patient's condition.   Final Clinical Impressions(s) / ED Diagnoses   Final diagnoses:  Hyperglycemia    ED Discharge Orders    None       Malvin Johns, MD 04/21/17 [phone]

## 2017-04-20 NOTE — ED Notes (Signed)
PT made aware of urine sample. Urinal at bedside

## 2017-04-21 ENCOUNTER — Emergency Department (HOSPITAL_COMMUNITY)
Admission: EM | Admit: 2017-04-21 | Discharge: 2017-04-22 | Disposition: A | Discharge status: Home / Self Care | Payer: Self-pay | Attending: Emergency Medicine | Admitting: Emergency Medicine

## 2017-04-21 DIAGNOSIS — E1165 Type 2 diabetes mellitus with hyperglycemia: Secondary | ICD-10-CM | POA: Insufficient documentation

## 2017-04-21 DIAGNOSIS — R739 Hyperglycemia, unspecified: Secondary | ICD-10-CM

## 2017-04-21 DIAGNOSIS — Z8673 Personal history of transient ischemic attack (TIA), and cerebral infarction without residual deficits: Secondary | ICD-10-CM | POA: Insufficient documentation

## 2017-04-21 DIAGNOSIS — Z794 Long term (current) use of insulin: Secondary | ICD-10-CM | POA: Insufficient documentation

## 2017-04-21 DIAGNOSIS — Z7982 Long term (current) use of aspirin: Secondary | ICD-10-CM | POA: Insufficient documentation

## 2017-04-21 DIAGNOSIS — I10 Essential (primary) hypertension: Secondary | ICD-10-CM | POA: Insufficient documentation

## 2017-04-21 DIAGNOSIS — Z79899 Other long term (current) drug therapy: Secondary | ICD-10-CM | POA: Insufficient documentation

## 2017-04-21 LAB — URINALYSIS, ROUTINE W REFLEX MICROSCOPIC
BILIRUBIN URINE: NEGATIVE
Bacteria, UA: NONE SEEN
Glucose, UA: >=500 mg/dL — AB
Ketones, ur: NEGATIVE mg/dL
LEUKOCYTES UA: NEGATIVE
NITRITE: NEGATIVE
PH: 7 (ref 5.0–8.0)
Protein, ur: NEGATIVE mg/dL
SPECIFIC GRAVITY, URINE: 1.023 (ref 1.005–1.030)
Squamous Epithelial / LPF: NONE SEEN

## 2017-04-21 LAB — CBC WITH DIFFERENTIAL/PLATELET
BASOS ABS: 0 10*3/uL (ref 0.0–0.1)
BASOS PCT: 1 %
EOS ABS: 0.1 10*3/uL (ref 0.0–0.7)
Eosinophils Relative: 2 %
HCT: 31.1 % — ABNORMAL LOW (ref 39.0–52.0)
HEMOGLOBIN: 9.7 g/dL — AB (ref 13.0–17.0)
Lymphocytes Relative: 31 %
Lymphs Abs: 1.3 10*3/uL (ref 0.7–4.0)
MCH: 25.9 pg — ABNORMAL LOW (ref 26.0–34.0)
MCHC: 31.2 g/dL (ref 30.0–36.0)
MCV: 82.9 fL (ref 78.0–100.0)
MONOS PCT: 9 %
Monocytes Absolute: 0.4 10*3/uL (ref 0.1–1.0)
NEUTROS PCT: 57 %
Neutro Abs: 2.5 10*3/uL (ref 1.7–7.7)
Platelets: 321 10*3/uL (ref 150–400)
RBC: 3.75 MIL/uL — ABNORMAL LOW (ref 4.22–5.81)
RDW: 13.5 % (ref 11.5–15.5)
WBC: 4.3 10*3/uL (ref 4.0–10.5)

## 2017-04-21 LAB — CBG MONITORING, ED
GLUCOSE-CAPILLARY: 383 mg/dL — AB (ref 65–99)
GLUCOSE-CAPILLARY: 328 mg/dL — AB (ref 65–99)
Glucose-Capillary: 487 mg/dL — ABNORMAL HIGH (ref 65–99)
Glucose-Capillary: 549 mg/dL (ref 65–99)
Glucose-Capillary: >600 mg/dL (ref 65–99)
Glucose-Capillary: >600 mg/dL (ref 65–99)

## 2017-04-21 LAB — BASIC METABOLIC PANEL
ANION GAP: 9 (ref 5–15)
BUN: 17 mg/dL (ref 6–20)
CALCIUM: 9.6 mg/dL (ref 8.9–10.3)
CO2: 29 mmol/L (ref 22–32)
CREATININE: 1.09 mg/dL (ref 0.61–1.24)
Chloride: 97 mmol/L — ABNORMAL LOW (ref 101–111)
GFR calc non Af Amer: >60 mL/min (ref >60)
Glucose, Bld: 738 mg/dL (ref 65–99)
Potassium: 4.7 mmol/L (ref 3.5–5.1)
SODIUM: 135 mmol/L (ref 135–145)

## 2017-04-21 LAB — BLOOD GAS, VENOUS
ACID-BASE EXCESS: 3.7 mmol/L — AB (ref 0.0–2.0)
Bicarbonate: 29.5 mmol/L — ABNORMAL HIGH (ref 20.0–28.0)
O2 Saturation: 64.6 %
PCO2 VEN: 54.2 mmHg (ref 44.0–60.0)
Patient temperature: 98.6
pH, Ven: 7.355 (ref 7.250–7.430)
pO2, Ven: 37.5 mmHg (ref 32.0–45.0)

## 2017-04-21 MED ORDER — INSULIN ASPART PROT & ASPART (70-30 MIX) 100 UNIT/ML ~~LOC~~ SUSP: 30.0000 [IU] | Freq: Two times a day (BID) | SUBCUTANEOUS | 11 refills | Status: DC

## 2017-04-21 MED ORDER — SODIUM CHLORIDE 0.9 % IV SOLN
INTRAVENOUS | Status: DC
  Administered 2017-04-21: 4.9 [IU]/h via INTRAVENOUS
  Filled 2017-04-21: qty 1

## 2017-04-21 MED ORDER — SODIUM CHLORIDE 0.9 % IV SOLN: INTRAVENOUS | Status: DC

## 2017-04-21 MED ORDER — DEXTROSE-NACL 5-0.45 % IV SOLN: INTRAVENOUS | Status: DC

## 2017-04-21 MED ORDER — SODIUM CHLORIDE 0.9 % IV BOLUS
1000.0000 mL | Freq: Once | INTRAVENOUS | Status: AC
  Administered 2017-04-21: 1000 mL via INTRAVENOUS

## 2017-04-21 MED ORDER — SODIUM CHLORIDE 0.9 % IV SOLN
INTRAVENOUS | Status: DC
  Administered 2017-04-21: 4.3 [IU]/h via INTRAVENOUS
  Filled 2017-04-21 (×2): qty 1

## 2017-04-21 MED ORDER — SODIUM CHLORIDE 0.9 % IV SOLN
INTRAVENOUS | Status: DC
  Administered 2017-04-21: 23:00:00 via INTRAVENOUS

## 2017-04-21 NOTE — ED Provider Notes (Signed)
12:30 AM  Assumed care from Dr. Fredderick PhenixBelfi.  Patient is a 57 year old homeless male with history of diabetes who is well-known to our emergency department who presents to the emergency department today with hyperglycemia.  Not in DKA.  Plan is to start insulin drip and hydrate patient in the emergency department and monitor until blood glucose has improved.  3:10 AM  Pt's blood sugar down to 328 from 1066.  He is not vomiting.  Again no DKA at this time.  Patient is well-known to this emergency department with history of noncompliance and eating sugar packets in order to get admitted to the hospital frequently.  I feel he is safe for discharge.  At this time, I do not feel there is any life-threatening condition present. I have reviewed and discussed all results (EKG, imaging, lab, urine as appropriate) and exam findings with patient/family. I have reviewed nursing notes and appropriate previous records.  I feel the patient is safe to be discharged home without further emergent workup and can continue workup as an outpatient as needed. Discussed usual and customary return precautions. Patient/family verbalize understanding and are comfortable with this plan.  Outpatient follow-up has been provided if needed. All questions have been answered.    Jadasia Haws, Layla MawKristen N, DO 04/21/17 [phone]

## 2017-04-21 NOTE — ED Notes (Signed)
Insulin drip d/c'd and patient d/c'd per MD.

## 2017-04-21 NOTE — ED Provider Notes (Addendum)
[facility] DEPT Provider Note   CSN: [id number] Arrival date & time: 04/21/17  2016     History   Chief Complaint Chief Complaint  Patient presents with  . Hyperglycemia    HPI Mike [name] is a 57 y.o. male.  HPI  57 year old male with a history of type 2 diabetes and known noncompliance presents with hyperglycemia.  He is an overall poor historian.  He states that he feels achy all over which is a sign to him that he has elevated glucose.  The patient denies feeling ill otherwise and has not been having a headache, chest pain, or vomiting.  He states he has been compliant with his insulin. Glucose 418 with EMS.  He was seen here yesterday where his glucose was over 1000 and he was treated with a glucose stabilizer and when it improved he was discharged home.  Past Medical History:  Diagnosis Date  . Anxiety   . Anxiety   . Chronic lower back pain   . Depression   . DKA (diabetic ketoacidoses) ([place]) 07/30/2016  . Hyperlipemia   . Hypertension   . Migraine    "last one was ~ 4 yr ago" (12/23/2014)  . Seizures ([place])    "related to pills for anxiety; if I don't take the pills I'm suppose to take I'll have them" (12/23/2014)  . Type II diabetes mellitus [facility])     Patient Active Problem List   Diagnosis Date Noted  . AKI (acute kidney injury) ([place]) 03/04/2017  . Malnutrition of moderate degree 02/08/2017  . DKA (diabetic ketoacidoses) ([place]) 02/07/2017  . History of stroke 02/07/2017  . Hyperphosphatemia 12/13/2016  . Evaluation by psychiatric service required 11/30/2016  . Lactic acidosis 11/18/2016  . Normocytic anemia 11/06/2016  . HLD (hyperlipidemia) 11/06/2016  . Adjustment disorder with other symptoms 11/05/2016  . Uncontrolled type 2 diabetes mellitus with hyperosmolar nonketotic hyperglycemia ([place]) 08/24/2016  . Essential hypertension 08/24/2016  . Acute ischemic stroke ([place])   . Protein-calorie malnutrition, severe 12/24/2014    . Homelessness 12/23/2014  . Hypertensive urgency 12/23/2014  . DM (diabetes mellitus) ([place]) 12/23/2014  . Intermittent palpitations 12/23/2014  . Tobacco abuse 12/23/2014  . Pleuritic chest pain 12/23/2014  . Abnormal EKG 12/23/2014  . Type II diabetes mellitus with renal manifestations [facility])     Past Surgical History:  Procedure Laterality Date  . NO PAST SURGERIES          Home Medications    Prior to Admission medications   Medication Sig Start Date End Date Taking? Authorizing Provider  aspirin 325 MG tablet Take 1 tablet (325 mg total) by mouth daily. 03/08/17  Yes Georgette Shell, MD  atorvastatin (LIPITOR) 40 MG tablet Take 1 tablet (40 mg total) by mouth daily. 01/26/17  Yes Cardama, Grayce Sessions, MD  blood glucose meter kit and supplies KIT Dispense based on patient and insurance preference. Use up to four times daily as directed. (FOR ICD-9 250.00, 250.01). 11/22/16  Yes Velvet Bathe, MD  ferrous sulfate 325 (65 FE) MG tablet Take 1 tablet (325 mg total) daily by mouth. Patient taking differently: Take 325 mg by mouth daily as needed (low iron).  11/23/16  Yes Ward, Cyril Mourning N, DO  insulin aspart protamine- aspart (NOVOLOG MIX 70/30) (70-30) 100 UNIT/ML injection Inject 0.3 mLs (30 Units total) into the skin 2 (two) times daily with a meal. 04/21/17  Yes Ward, Kristen N, DO  lisinopril (PRINIVIL,ZESTRIL) 40 MG tablet Take 1 tablet (40  mg total) by mouth daily. 01/26/17  Yes Cardama, Grayce Sessions, MD  metoprolol tartrate (LOPRESSOR) 25 MG tablet Take 0.5 tablets (12.5 mg total) by mouth 2 (two) times daily. 03/07/17  Yes Georgette Shell, MD    Family History Family History  Problem Relation Age of Onset  . Diabetes Mellitus II Mother   . Diabetes Mellitus II Father     Social History Social History   Tobacco Use  . Smoking status: Current Every Day Smoker    Packs/day: 0.10    Years: 40.00    Pack years: 4.00    Types: Cigarettes  . Smokeless tobacco:  Never Used  Substance Use Topics  . Alcohol use: Yes    Alcohol/week: 1.2 oz    Types: 2 Cans of beer per week  . Drug use: No    Comment: 12/23/2014 "stopped ~ 10 yrs ago"     Allergies   Ibuprofen and Tylenol [acetaminophen]   Review of Systems Review of Systems  Cardiovascular: Negative for chest pain.  Gastrointestinal: Negative for vomiting.  Musculoskeletal: Positive for arthralgias.  Neurological: Negative for headaches.  All other systems reviewed and are negative.    Physical Exam Updated Vital Signs BP (!) 168/67 (BP Location: Left Arm)   Pulse 68   Temp 98.7 F (37.1 C) (Oral)   Resp 14   SpO2 100%   Physical Exam  Constitutional: He appears well-developed and well-nourished. No distress.  HENT:  Head: Normocephalic and atraumatic.  Right Ear: External ear normal.  Left Ear: External ear normal.  Nose: Nose normal.  Eyes: Right eye exhibits no discharge. Left eye exhibits no discharge.  Neck: Neck supple.  Cardiovascular: Normal rate, regular rhythm and normal heart sounds.  Pulmonary/Chest: Effort normal and breath sounds normal.  Abdominal: Soft. He exhibits no distension. There is no tenderness.  Musculoskeletal: He exhibits no edema.  Neurological: He is alert.  Skin: Skin is warm and dry. He is not diaphoretic.  Nursing note and vitals reviewed.    ED Treatments / Results  Labs (all labs ordered are listed, but only abnormal results are displayed) Labs Reviewed  BLOOD GAS, VENOUS - Abnormal; Notable for the following components:      Result Value   Bicarbonate 29.5 (*)    Acid-Base Excess 3.7 (*)    All other components within normal limits  BASIC METABOLIC PANEL - Abnormal; Notable for the following components:   Chloride 97 (*)    Glucose, Bld 738 (*)    All other components within normal limits  CBC WITH DIFFERENTIAL/PLATELET - Abnormal; Notable for the following components:   RBC 3.75 (*)    Hemoglobin 9.7 (*)    HCT 31.1 (*)     MCH 25.9 (*)    All other components within normal limits  URINALYSIS, ROUTINE W REFLEX MICROSCOPIC - Abnormal; Notable for the following components:   Color, Urine COLORLESS (*)    Glucose, UA >=500 (*)    Hgb urine dipstick SMALL (*)    All other components within normal limits  CBG MONITORING, ED - Abnormal; Notable for the following components:   Glucose-Capillary >600 (*)    All other components within normal limits  CBG MONITORING, ED - Abnormal; Notable for the following components:   Glucose-Capillary 487 (*)    All other components within normal limits    EKG None  Radiology No results found.  Procedures Procedures (including critical care time)  Medications Ordered in ED Medications  dextrose 5 %-  0.45 % sodium chloride infusion ( Intravenous Hold 04/21/17 2215)  insulin regular (NOVOLIN R,HUMULIN R) 100 Units in sodium chloride 0.9 % 100 mL (1 Units/mL) infusion (4.3 Units/hr Intravenous New Bag/Given 04/21/17 2307)  0.9 %  sodium chloride infusion ( Intravenous New Bag/Given 04/21/17 2309)  sodium chloride 0.9 % bolus 1,000 mL (0 mLs Intravenous Stopped 04/21/17 2212)     Initial Impression / Assessment and Plan / ED Course  I have reviewed the triage vital signs and the nursing notes.  Pertinent labs & imaging results that were available during my care of the patient were reviewed by me and considered in my medical decision making (see chart for details).     Hyperglycemia without ketosis or DKA.  He has been known to be both noncompliant and also ingest sugar specifically to be admitted.  However at this point there is no indication for admission.  Vital signs consistent with hypertension that is chronic and similar to prior.  At this point, will reduce glucose with glucose stabilizer and plan for discharge when improved. Care to Dr. Randal Buba.  Final Clinical Impressions(s) / ED Diagnoses   Final diagnoses:  Hyperglycemia    ED Discharge Orders    None         Sherwood Gambler, MD 04/21/17 0932    Sherwood Gambler, MD 05/07/17 [phone]

## 2017-04-21 NOTE — ED Notes (Signed)
Will check CBG at 0200 to align with glucose stabilizer.

## 2017-04-21 NOTE — ED Triage Notes (Signed)
Pt presents with c/o elevated BG, medics report spot cbg of 418. C/o not feeling well.

## 2017-04-22 ENCOUNTER — Encounter (HOSPITAL_COMMUNITY): Payer: Self-pay | Admitting: Emergency Medicine

## 2017-04-22 DIAGNOSIS — R739 Hyperglycemia, unspecified: Secondary | ICD-10-CM | POA: Insufficient documentation

## 2017-04-22 DIAGNOSIS — Z5321 Procedure and treatment not carried out due to patient leaving prior to being seen by health care provider: Secondary | ICD-10-CM | POA: Insufficient documentation

## 2017-04-22 LAB — BASIC METABOLIC PANEL
Anion gap: 14 (ref 5–15)
BUN: 19 mg/dL (ref 6–20)
CHLORIDE: 95 mmol/L — AB (ref 101–111)
CO2: 24 mmol/L (ref 22–32)
Calcium: 9.5 mg/dL (ref 8.9–10.3)
Creatinine, Ser: 0.91 mg/dL (ref 0.61–1.24)
GFR calc non Af Amer: >60 mL/min (ref >60)
Glucose, Bld: 612 mg/dL (ref 65–99)
POTASSIUM: 4.2 mmol/L (ref 3.5–5.1)
Sodium: 133 mmol/L — ABNORMAL LOW (ref 135–145)

## 2017-04-22 LAB — CBG MONITORING, ED
GLUCOSE-CAPILLARY: 371 mg/dL — AB (ref 65–99)
Glucose-Capillary: 311 mg/dL — ABNORMAL HIGH (ref 65–99)

## 2017-04-22 NOTE — ED Triage Notes (Signed)
Per EMS, patient c/o hyperglycemia today. CBG >500 with EMS.

## 2017-04-22 NOTE — ED Notes (Signed)
Please void the cbg recorded at this time, error in obtaining sample. Please see repeat.

## 2017-04-22 NOTE — ED Notes (Signed)
Pt is urinating in lobby. At this time he has urinated in the lobby 4 times already.

## 2017-04-23 ENCOUNTER — Encounter (HOSPITAL_COMMUNITY): Payer: Self-pay | Admitting: Emergency Medicine

## 2017-04-23 ENCOUNTER — Emergency Department (HOSPITAL_COMMUNITY)
Admission: EM | Admit: 2017-04-23 | Discharge: 2017-04-23 | Disposition: A | Discharge status: Home / Self Care | Payer: Self-pay | Attending: Emergency Medicine | Admitting: Emergency Medicine

## 2017-04-23 DIAGNOSIS — Z8673 Personal history of transient ischemic attack (TIA), and cerebral infarction without residual deficits: Secondary | ICD-10-CM | POA: Insufficient documentation

## 2017-04-23 DIAGNOSIS — F17210 Nicotine dependence, cigarettes, uncomplicated: Secondary | ICD-10-CM | POA: Insufficient documentation

## 2017-04-23 DIAGNOSIS — R739 Hyperglycemia, unspecified: Secondary | ICD-10-CM

## 2017-04-23 DIAGNOSIS — Z794 Long term (current) use of insulin: Secondary | ICD-10-CM | POA: Insufficient documentation

## 2017-04-23 DIAGNOSIS — Z79899 Other long term (current) drug therapy: Secondary | ICD-10-CM | POA: Insufficient documentation

## 2017-04-23 DIAGNOSIS — I10 Essential (primary) hypertension: Secondary | ICD-10-CM | POA: Insufficient documentation

## 2017-04-23 DIAGNOSIS — Z7982 Long term (current) use of aspirin: Secondary | ICD-10-CM | POA: Insufficient documentation

## 2017-04-23 DIAGNOSIS — E1165 Type 2 diabetes mellitus with hyperglycemia: Secondary | ICD-10-CM | POA: Insufficient documentation

## 2017-04-23 LAB — CBG MONITORING, ED: GLUCOSE-CAPILLARY: 413 mg/dL — AB (ref 65–99)

## 2017-04-23 LAB — CBC
HCT: 32 % — ABNORMAL LOW (ref 39.0–52.0)
HEMATOCRIT: 32.8 % — AB (ref 39.0–52.0)
Hemoglobin: 10 g/dL — ABNORMAL LOW (ref 13.0–17.0)
Hemoglobin: 10 g/dL — ABNORMAL LOW (ref 13.0–17.0)
MCH: 25.2 pg — ABNORMAL LOW (ref 26.0–34.0)
MCH: 25.1 pg — ABNORMAL LOW (ref 26.0–34.0)
MCHC: 30.5 g/dL (ref 30.0–36.0)
MCHC: 31.3 g/dL (ref 30.0–36.0)
MCV: 82.6 fL (ref 78.0–100.0)
MCV: 80.4 fL (ref 78.0–100.0)
PLATELETS: 353 10*3/uL (ref 150–400)
PLATELETS: 282 10*3/uL (ref 150–400)
RBC: 3.97 MIL/uL — ABNORMAL LOW (ref 4.22–5.81)
RBC: 3.98 MIL/uL — AB (ref 4.22–5.81)
RDW: 13.5 % (ref 11.5–15.5)
RDW: 12.9 % (ref 11.5–15.5)
WBC: 4.4 10*3/uL (ref 4.0–10.5)
WBC: 4.6 10*3/uL (ref 4.0–10.5)

## 2017-04-23 LAB — BASIC METABOLIC PANEL
Anion gap: 13 (ref 5–15)
BUN: 15 mg/dL (ref 6–20)
CO2: 26 mmol/L (ref 22–32)
Calcium: 9.5 mg/dL (ref 8.9–10.3)
Chloride: 90 mmol/L — ABNORMAL LOW (ref 101–111)
Creatinine, Ser: 1.09 mg/dL (ref 0.61–1.24)
GFR calc non Af Amer: >60 mL/min (ref >60)
Glucose, Bld: 800 mg/dL (ref 65–99)
Potassium: 4.2 mmol/L (ref 3.5–5.1)
Sodium: 129 mmol/L — ABNORMAL LOW (ref 135–145)

## 2017-04-23 LAB — I-STAT CHEM 8, ED
BUN: 15 mg/dL (ref 6–20)
CREATININE: 0.7 mg/dL (ref 0.61–1.24)
Calcium, Ion: 1.15 mmol/L (ref 1.15–1.40)
Chloride: 96 mmol/L — ABNORMAL LOW (ref 101–111)
Glucose, Bld: 669 mg/dL (ref 65–99)
HCT: 34 % — ABNORMAL LOW (ref 39.0–52.0)
HEMOGLOBIN: 11.6 g/dL — AB (ref 13.0–17.0)
Potassium: 4.1 mmol/L (ref 3.5–5.1)
Sodium: 133 mmol/L — ABNORMAL LOW (ref 135–145)
TCO2: 29 mmol/L (ref 22–32)

## 2017-04-23 MED ORDER — INSULIN ASPART 100 UNIT/ML ~~LOC~~ SOLN
10.0000 [IU] | Freq: Once | SUBCUTANEOUS | Status: AC
  Administered 2017-04-23: 10 [IU] via SUBCUTANEOUS
  Filled 2017-04-23: qty 1

## 2017-04-23 MED ORDER — SODIUM CHLORIDE 0.9 % IV BOLUS
1000.0000 mL | Freq: Once | INTRAVENOUS | Status: AC
  Administered 2017-04-23: 1000 mL via INTRAVENOUS

## 2017-04-23 NOTE — ED Notes (Signed)
Patient was awakened by Security. Patient stated, "He did not want to be seen."

## 2017-04-23 NOTE — ED Provider Notes (Signed)
[facility] EMERGENCY DEPARTMENT Provider Note   CSN: [id number] Arrival date & time: 04/23/17  1807     History   Chief Complaint Chief Complaint  Patient presents with  . Hyperglycemia    HPI Mike [name] is a 57 y.o. male With a history of 38 ED visits in the past 6 months with 7 admissions, uncontrolled diabetes type 2, hypertension, hyperlipidemia, depression, homelessness, who presents today for evaluation of hyperglycemia.  According to EMS his friends report that he keeps drinking large sugary drinks to try and get himself admitted.  He reportedly had a large container of fruit punch when EMS picked him up.  EMS reports that his CBG read as high.  Chart review shows that he was seen at [facility] overnight where he was reportedly sleeping in the waiting room, refusing to be placed in a room to be seen by a doctor and reportedly urinated in the waiting room for times.  He ended up leaving before he was seen.  He has a long-standing history of noncompliance with his diabetes.    HPI  Past Medical History:  Diagnosis Date  . Anxiety   . Anxiety   . Chronic lower back pain   . Depression   . DKA (diabetic ketoacidoses) ([place]) 07/30/2016  . Hyperlipemia   . Hypertension   . Migraine    "last one was ~ 4 yr ago" (12/23/2014)  . Seizures ([place])    "related to pills for anxiety; if I don't take the pills I'm suppose to take I'll have them" (12/23/2014)  . Type II diabetes mellitus [facility])     Patient Active Problem List   Diagnosis Date Noted  . AKI (acute kidney injury) ([place]) 03/04/2017  . Malnutrition of moderate degree 02/08/2017  . DKA (diabetic ketoacidoses) ([place]) 02/07/2017  . History of stroke 02/07/2017  . Hyperphosphatemia 12/13/2016  . Evaluation by psychiatric service required 11/30/2016  . Lactic acidosis 11/18/2016  . Normocytic anemia 11/06/2016  . HLD (hyperlipidemia) 11/06/2016  . Adjustment disorder with other symptoms 11/05/2016  .  Uncontrolled type 2 diabetes mellitus with hyperosmolar nonketotic hyperglycemia ([place]) 08/24/2016  . Essential hypertension 08/24/2016  . Acute ischemic stroke ([place])   . Protein-calorie malnutrition, severe 12/24/2014  . Homelessness 12/23/2014  . Hypertensive urgency 12/23/2014  . DM (diabetes mellitus) ([place]) 12/23/2014  . Intermittent palpitations 12/23/2014  . Tobacco abuse 12/23/2014  . Pleuritic chest pain 12/23/2014  . Abnormal EKG 12/23/2014  . Type II diabetes mellitus with renal manifestations [facility])     Past Surgical History:  Procedure Laterality Date  . NO PAST SURGERIES          Home Medications    Prior to Admission medications   Medication Sig Start Date End Date Taking? Authorizing Provider  aspirin 325 MG tablet Take 1 tablet (325 mg total) by mouth daily. 03/08/17   Georgette Shell, MD  atorvastatin (LIPITOR) 40 MG tablet Take 1 tablet (40 mg total) by mouth daily. 01/26/17   Cardama, Grayce Sessions, MD  blood glucose meter kit and supplies KIT Dispense based on patient and insurance preference. Use up to four times daily as directed. (FOR ICD-9 250.00, 250.01). 11/22/16   Velvet Bathe, MD  ferrous sulfate 325 (65 FE) MG tablet Take 1 tablet (325 mg total) daily by mouth. Patient taking differently: Take 325 mg by mouth daily as needed (low iron).  11/23/16   Ward, Delice Bison, DO  insulin aspart protamine- aspart (NOVOLOG MIX 70/30) (70-30)  100 UNIT/ML injection Inject 0.3 mLs (30 Units total) into the skin 2 (two) times daily with a meal. 04/21/17   Ward, Delice Bison, DO  lisinopril (PRINIVIL,ZESTRIL) 40 MG tablet Take 1 tablet (40 mg total) by mouth daily. 01/26/17   Fatima Blank, MD  metoprolol tartrate (LOPRESSOR) 25 MG tablet Take 0.5 tablets (12.5 mg total) by mouth 2 (two) times daily. 03/07/17   Georgette Shell, MD    Family History Family History  Problem Relation Age of Onset  . Diabetes Mellitus II Mother   . Diabetes Mellitus II Father      Social History Social History   Tobacco Use  . Smoking status: Current Every Day Smoker    Packs/day: 0.10    Years: 40.00    Pack years: 4.00    Types: Cigarettes  . Smokeless tobacco: Never Used  Substance Use Topics  . Alcohol use: Yes    Alcohol/week: 1.2 oz    Types: 2 Cans of beer per week  . Drug use: No    Comment: 12/23/2014 "stopped ~ 10 yrs ago"     Allergies   Ibuprofen and Tylenol [acetaminophen]   Review of Systems Review of Systems  Constitutional: Negative for chills and fever.  Cardiovascular: Negative for chest pain.  Gastrointestinal: Negative for abdominal pain, diarrhea, nausea and vomiting.  Endocrine: Positive for polydipsia and polyuria.  Neurological: Negative for headaches.  All other systems reviewed and are negative.    Physical Exam Updated Vital Signs BP 120/84   Pulse (!) 56   Temp 97.9 F (36.6 C) (Oral)   Resp 16   Ht '5\' 8"'$  (1.727 m)   Wt 77.1 kg (170 lb)   SpO2 100%   BMI 25.85 kg/m   Physical Exam  Constitutional: He is oriented to person, place, and time. No distress.  Disheveled, multiple strands of grass and briars in his dreadlocks, he has urinated on him self and is generally malodorous.    HENT:  Head: Normocephalic and atraumatic.  Eyes: Conjunctivae are normal. Right eye exhibits no discharge. Left eye exhibits no discharge. No scleral icterus.  Neck: Normal range of motion.  Cardiovascular: Normal rate, regular rhythm and normal heart sounds.  No murmur heard. Pulmonary/Chest: Effort normal and breath sounds normal. No stridor. No respiratory distress.  Abdominal: Soft. Bowel sounds are normal. He exhibits no distension. There is no tenderness.  Musculoskeletal: He exhibits no edema or deformity.  Neurological: He is alert and oriented to person, place, and time. He exhibits normal muscle tone.  Skin: Skin is warm and dry.  Psychiatric: He has a normal mood and affect. His behavior is normal. He is not  actively hallucinating. He expresses no homicidal and no suicidal ideation. He expresses no suicidal plans and no homicidal plans.  Nursing note and vitals reviewed.    ED Treatments / Results  Labs (all labs ordered are listed, but only abnormal results are displayed) Labs Reviewed  BASIC METABOLIC PANEL - Abnormal; Notable for the following components:      Result Value   Sodium 129 (*)    Chloride 90 (*)    Glucose, Bld 800 (*)    All other components within normal limits  CBC - Abnormal; Notable for the following components:   RBC 3.98 (*)    Hemoglobin 10.0 (*)    HCT 32.0 (*)    MCH 25.1 (*)    All other components within normal limits  I-STAT CHEM 8, ED - Abnormal;  Notable for the following components:   Sodium 133 (*)    Chloride 96 (*)    Glucose, Bld 669 (*)    Hemoglobin 11.6 (*)    HCT 34.0 (*)    All other components within normal limits  CBG MONITORING, ED - Abnormal; Notable for the following components:   Glucose-Capillary >600 (*)    All other components within normal limits  CBG MONITORING, ED - Abnormal; Notable for the following components:   Glucose-Capillary 413 (*)    All other components within normal limits    EKG None  Radiology No results found.  Procedures Procedures (including critical care time)  Medications Ordered in ED Medications  sodium chloride 0.9 % bolus 1,000 mL (0 mLs Intravenous Stopped 04/23/17 2000)  insulin aspart (novoLOG) injection 10 Units (10 Units Subcutaneous Given 04/23/17 2111)     Initial Impression / Assessment and Plan / ED Course  I have reviewed the triage vital signs and the nursing notes.  Pertinent labs & imaging results that were available during my care of the patient were reviewed by me and considered in my medical decision making (see chart for details).  Clinical Course as of Apr 23 2299  Mon Apr 23, 2017  2045 Obtained after one liter of fluids.   Glucose(!!): 669 [EH]    Clinical Course User  Index [EH] Lorin Glass, PA-C   Patient presents today for evaluation of hyperglycemia.  He was brought here by EMS who reports that he was drinking sugary drinks and his friends report he does this intentionally to come to the hospital as he is homeless.  He is well-known to the emergency department with 61 ED visits in the past 6 months requiring only 7 admissions.  In the past 3 days he has checked in 5 times for evaluation of this.  He initially was hyperglycemic at 800, his hyperglycemia was treated with 1 L of IV fluids and 10 units of subcutaneous insulin which reduced his sugar to 413.  He is not in DKA, does not have any significant electrolyte derangements.  He will be discharged home.  He was instructed to [facility] up with the [facility].  I discussed with him the risks of long term hyperglycemia and he stated his understanding.    Final Clinical Impressions(s) / ED Diagnoses   Final diagnoses:  Hyperglycemia    ED Discharge Orders    None       Ollen Gross 04/23/17 2301    Quintella Reichert, MD 04/23/17 2303

## 2017-04-23 NOTE — ED Notes (Signed)
Pt still refuses to get up when called to go back to room resting in lobby with eyes closed.

## 2017-04-23 NOTE — ED Notes (Signed)
Pt verbalized understanding discharge instructions and denies any further needs or questions at this time. VS stable, ambulatory and steady gait.   

## 2017-04-23 NOTE — Discharge Instructions (Addendum)
Please take your medications and insulin like you are supposed to.  Please do not drink sugary drinks and eat a low carb diet.

## 2017-04-23 NOTE — ED Notes (Signed)
Pt refuses to get up from sleeping in lobby to come back to room for doctor to see him.

## 2017-04-23 NOTE — ED Notes (Signed)
The RN just talked with Pt about his current living situation and resources. Pt does understand that his current diet is not appropriate for someone with Diabetes. Pt does not feel like he has been given proper resources to manage his care. When asking Pt if he understands the long term consequences of high blood sugar his is unaware of any.  Pt has been polite and respectful to this RN. RN has also discussed with Pt that he will be NPO until sugars can be controlled.

## 2017-04-23 NOTE — ED Notes (Signed)
CNA sent down dark green for Chem 8 down to lab by accident. RN will redraw lab.

## 2017-04-23 NOTE — ED Notes (Signed)
No answer x1

## 2017-04-23 NOTE — ED Notes (Signed)
Pt bed is soaked in his own urine.  Pt given fresh pair of paper scrubs and blankets. Pt states he is able to able to clean himself up.  RN will assist

## 2017-04-23 NOTE — ED Triage Notes (Addendum)
Per EMS: Pt willing is ingesting high sugar drinks in order to to be admitted.  Pt is A&Ox4 and of sound mind, denies HA or SI. Pt was just seen and discharged from the ED today. CBG read high

## 2017-04-24 ENCOUNTER — Emergency Department (HOSPITAL_COMMUNITY)
Admission: EM | Admit: 2017-04-24 | Discharge: 2017-04-24 | Disposition: A | Discharge status: Home / Self Care | Payer: Self-pay | Attending: Emergency Medicine | Admitting: Emergency Medicine

## 2017-04-24 ENCOUNTER — Emergency Department (HOSPITAL_COMMUNITY): Admission: EM | Admit: 2017-04-24 | Discharge: 2017-04-24 | Discharge status: Against Medical Advice | Payer: Self-pay

## 2017-04-24 ENCOUNTER — Encounter (HOSPITAL_COMMUNITY): Payer: Self-pay

## 2017-04-24 ENCOUNTER — Other Ambulatory Visit: Payer: Self-pay

## 2017-04-24 DIAGNOSIS — Z79899 Other long term (current) drug therapy: Secondary | ICD-10-CM | POA: Insufficient documentation

## 2017-04-24 DIAGNOSIS — Z794 Long term (current) use of insulin: Secondary | ICD-10-CM | POA: Insufficient documentation

## 2017-04-24 DIAGNOSIS — I10 Essential (primary) hypertension: Secondary | ICD-10-CM | POA: Insufficient documentation

## 2017-04-24 DIAGNOSIS — F17210 Nicotine dependence, cigarettes, uncomplicated: Secondary | ICD-10-CM | POA: Insufficient documentation

## 2017-04-24 DIAGNOSIS — R739 Hyperglycemia, unspecified: Secondary | ICD-10-CM

## 2017-04-24 DIAGNOSIS — Z7982 Long term (current) use of aspirin: Secondary | ICD-10-CM | POA: Insufficient documentation

## 2017-04-24 DIAGNOSIS — E1165 Type 2 diabetes mellitus with hyperglycemia: Secondary | ICD-10-CM | POA: Insufficient documentation

## 2017-04-24 LAB — I-STAT CHEM 8, ED
BUN: 20 mg/dL (ref 6–20)
Calcium, Ion: 1.12 mmol/L — ABNORMAL LOW (ref 1.15–1.40)
Chloride: 93 mmol/L — ABNORMAL LOW (ref 101–111)
Creatinine, Ser: 1 mg/dL (ref 0.61–1.24)
Glucose, Bld: >700 mg/dL (ref 65–99)
HEMATOCRIT: 37 % — AB (ref 39.0–52.0)
Hemoglobin: 12.6 g/dL — ABNORMAL LOW (ref 13.0–17.0)
Potassium: 4.7 mmol/L (ref 3.5–5.1)
Sodium: 131 mmol/L — ABNORMAL LOW (ref 135–145)
TCO2: 30 mmol/L (ref 22–32)

## 2017-04-24 LAB — CBG MONITORING, ED: Glucose-Capillary: >600 mg/dL (ref 65–99)

## 2017-04-24 NOTE — ED Triage Notes (Signed)
Pt was just seen and treated about two hours ago, [facility], he states that his blood sugar was elevated and wanted to be seen

## 2017-04-24 NOTE — ED Triage Notes (Signed)
Pt arrived via EMS fnd  in restaurant in women bathroom urinating. Pt reports that his sugar is high. Pt has no other symptoms.  EMS v/s BP 158/82, HR 72, RR 16 CBG read High

## 2017-04-24 NOTE — ED Provider Notes (Signed)
[facility] DEPT Provider Note   CSN: [id number] Arrival date & time: 04/24/17  1143     History   Chief Complaint Chief Complaint  Patient presents with  . Hyperglycemia    HPI Mike [name] is a 57 y.o. male.  HPI Patient is a 57 year old male who is well-known to myself with 47 visits to this emergency department the past 6 months he presents the emergency department with his usual complaint which is hyperglycemia.  He states that he is intermittently compliant with his medications and that he continues to eat carbohydrates.  He reports no exercise.  He does not routinely meet with his primary care team.  He states he would like his blood sugars to be better.  His blood sugars consistently greater than 600 when he was seen in the emergency department.  He denies weakness or lightheadedness.  No fevers or chills.  No chest pain or shortness of breath.   Past Medical History:  Diagnosis Date  . Anxiety   . Anxiety   . Chronic lower back pain   . Depression   . DKA (diabetic ketoacidoses) ([place]) 07/30/2016  . Hyperlipemia   . Hypertension   . Migraine    "last one was ~ 4 yr ago" (12/23/2014)  . Seizures ([place])    "related to pills for anxiety; if I don't take the pills I'm suppose to take I'll have them" (12/23/2014)  . Type II diabetes mellitus [facility])     Patient Active Problem List   Diagnosis Date Noted  . AKI (acute kidney injury) ([place]) 03/04/2017  . Malnutrition of moderate degree 02/08/2017  . DKA (diabetic ketoacidoses) ([place]) 02/07/2017  . History of stroke 02/07/2017  . Hyperphosphatemia 12/13/2016  . Evaluation by psychiatric service required 11/30/2016  . Lactic acidosis 11/18/2016  . Normocytic anemia 11/06/2016  . HLD (hyperlipidemia) 11/06/2016  . Adjustment disorder with other symptoms 11/05/2016  . Uncontrolled type 2 diabetes mellitus with hyperosmolar nonketotic hyperglycemia ([place]) 08/24/2016  . Essential hypertension  08/24/2016  . Acute ischemic stroke ([place])   . Protein-calorie malnutrition, severe 12/24/2014  . Homelessness 12/23/2014  . Hypertensive urgency 12/23/2014  . DM (diabetes mellitus) ([place]) 12/23/2014  . Intermittent palpitations 12/23/2014  . Tobacco abuse 12/23/2014  . Pleuritic chest pain 12/23/2014  . Abnormal EKG 12/23/2014  . Type II diabetes mellitus with renal manifestations [facility])     Past Surgical History:  Procedure Laterality Date  . NO PAST SURGERIES          Home Medications    Prior to Admission medications   Medication Sig Start Date End Date Taking? Authorizing Provider  aspirin 325 MG tablet Take 1 tablet (325 mg total) by mouth daily. 03/08/17   Georgette Shell, MD  atorvastatin (LIPITOR) 40 MG tablet Take 1 tablet (40 mg total) by mouth daily. 01/26/17   Cardama, Grayce Sessions, MD  blood glucose meter kit and supplies KIT Dispense based on patient and insurance preference. Use up to four times daily as directed. (FOR ICD-9 250.00, 250.01). 11/22/16   Velvet Bathe, MD  ferrous sulfate 325 (65 FE) MG tablet Take 1 tablet (325 mg total) daily by mouth. Patient taking differently: Take 325 mg by mouth daily as needed (low iron).  11/23/16   Ward, Delice Bison, DO  insulin aspart protamine- aspart (NOVOLOG MIX 70/30) (70-30) 100 UNIT/ML injection Inject 0.3 mLs (30 Units total) into the skin 2 (two) times daily with a meal. 04/21/17   Ward, Delice Bison,  DO  lisinopril (PRINIVIL,ZESTRIL) 40 MG tablet Take 1 tablet (40 mg total) by mouth daily. 01/26/17   Fatima Blank, MD  metoprolol tartrate (LOPRESSOR) 25 MG tablet Take 0.5 tablets (12.5 mg total) by mouth 2 (two) times daily. 03/07/17   Georgette Shell, MD    Family History Family History  Problem Relation Age of Onset  . Diabetes Mellitus II Mother   . Diabetes Mellitus II Father     Social History Social History   Tobacco Use  . Smoking status: Current Every Day Smoker    Packs/day: 0.10     Years: 40.00    Pack years: 4.00    Types: Cigarettes  . Smokeless tobacco: Never Used  Substance Use Topics  . Alcohol use: Yes    Alcohol/week: 1.2 oz    Types: 2 Cans of beer per week  . Drug use: No    Comment: 12/23/2014 "stopped ~ 10 yrs ago"     Allergies   Ibuprofen and Tylenol [acetaminophen]   Review of Systems Review of Systems  All other systems reviewed and are negative.    Physical Exam Updated Vital Signs BP (!) 170/84 (BP Location: Right Arm)   Pulse 90   Temp 98.2 F (36.8 C) (Oral)   Resp 16   SpO2 100%   Physical Exam  Constitutional: He is oriented to person, place, and time.  HENT:  Head: Normocephalic and atraumatic.  Eyes: EOM are normal.  Neck: Normal range of motion.  Cardiovascular: Normal rate, regular rhythm and intact distal pulses.  Pulmonary/Chest: Effort normal and breath sounds normal. No respiratory distress.  Abdominal: He exhibits no distension. There is no tenderness.  Musculoskeletal: Normal range of motion.  Neurological: He is alert and oriented to person, place, and time.  Skin: Skin is warm and dry.  Nursing note and vitals reviewed.    ED Treatments / Results  Labs (all labs ordered are listed, but only abnormal results are displayed) Labs Reviewed  CBG MONITORING, ED - Abnormal; Notable for the following components:      Result Value   Glucose-Capillary >600 (*)    All other components within normal limits  I-STAT CHEM 8, ED - Abnormal; Notable for the following components:   Sodium 131 (*)    Chloride 93 (*)    Glucose, Bld >700 (*)    Calcium, Ion 1.12 (*)    Hemoglobin 12.6 (*)    HCT 37.0 (*)    All other components within normal limits    EKG None  Radiology No results found.  Procedures Procedures (including critical care time)  Medications Ordered in ED Medications - No data to display   Initial Impression / Assessment and Plan / ED Course  I have reviewed the triage vital signs and the  nursing notes.  Pertinent labs & imaging results that were available during my care of the patient were reviewed by me and considered in my medical decision making (see chart for details).    Hyperglycemia without dka. Needs to be compliant with his meds. Vitals normal. Dc home in good condition   Final Clinical Impressions(s) / ED Diagnoses   Final diagnoses:  Hyperglycemia    ED Discharge Orders    None      Jola Schmidt, MD 04/24/17 1650

## 2017-04-24 NOTE — ED Notes (Addendum)
Pt eating candy and chips while drinking a soda from the vending machine.

## 2017-04-24 NOTE — ED Notes (Signed)
Patient is not compliant with plan of care-disruptive in waiting room-escorted out by off duty [employer] and security

## 2017-04-25 ENCOUNTER — Other Ambulatory Visit: Payer: Self-pay

## 2017-04-25 ENCOUNTER — Encounter (HOSPITAL_COMMUNITY): Payer: Self-pay | Admitting: Emergency Medicine

## 2017-04-25 ENCOUNTER — Emergency Department (HOSPITAL_COMMUNITY)
Admission: EM | Admit: 2017-04-25 | Discharge: 2017-04-25 | Disposition: A | Discharge status: Home / Self Care | Payer: Self-pay | Attending: Emergency Medicine | Admitting: Emergency Medicine

## 2017-04-25 DIAGNOSIS — Z590 Homelessness: Secondary | ICD-10-CM | POA: Insufficient documentation

## 2017-04-25 DIAGNOSIS — Z7982 Long term (current) use of aspirin: Secondary | ICD-10-CM | POA: Insufficient documentation

## 2017-04-25 DIAGNOSIS — Z79899 Other long term (current) drug therapy: Secondary | ICD-10-CM | POA: Insufficient documentation

## 2017-04-25 DIAGNOSIS — E1165 Type 2 diabetes mellitus with hyperglycemia: Secondary | ICD-10-CM | POA: Insufficient documentation

## 2017-04-25 DIAGNOSIS — Z794 Long term (current) use of insulin: Secondary | ICD-10-CM | POA: Insufficient documentation

## 2017-04-25 DIAGNOSIS — I10 Essential (primary) hypertension: Secondary | ICD-10-CM | POA: Insufficient documentation

## 2017-04-25 DIAGNOSIS — R739 Hyperglycemia, unspecified: Secondary | ICD-10-CM

## 2017-04-25 DIAGNOSIS — F17210 Nicotine dependence, cigarettes, uncomplicated: Secondary | ICD-10-CM | POA: Insufficient documentation

## 2017-04-25 LAB — I-STAT CHEM 8, ED
BUN: 20 mg/dL (ref 6–20)
CREATININE: 1 mg/dL (ref 0.61–1.24)
Calcium, Ion: 1.24 mmol/L (ref 1.15–1.40)
Chloride: 92 mmol/L — ABNORMAL LOW (ref 101–111)
Glucose, Bld: >700 mg/dL (ref 65–99)
HCT: 38 % — ABNORMAL LOW (ref 39.0–52.0)
Hemoglobin: 12.9 g/dL — ABNORMAL LOW (ref 13.0–17.0)
POTASSIUM: 5.1 mmol/L (ref 3.5–5.1)
Sodium: 131 mmol/L — ABNORMAL LOW (ref 135–145)
TCO2: 29 mmol/L (ref 22–32)

## 2017-04-25 LAB — CBG MONITORING, ED: Glucose-Capillary: >600 mg/dL (ref 65–99)

## 2017-04-25 MED ORDER — INSULIN ASPART PROT & ASPART (70-30 MIX) 100 UNIT/ML ~~LOC~~ SUSP
8.0000 [IU] | Freq: Once | SUBCUTANEOUS | Status: AC
  Administered 2017-04-25: 8 [IU] via SUBCUTANEOUS
  Filled 2017-04-25: qty 10

## 2017-04-25 NOTE — ED Notes (Signed)
Explained discharge instructions to pt,, he elected not to sign paperwork.

## 2017-04-25 NOTE — ED Triage Notes (Signed)
EMS states they were called to [facility] to transport [facility] to hospital due to high blood sugar. Pt will not talk in triage.

## 2017-04-25 NOTE — ED Notes (Signed)
Bed: [facility]PT4 Expected date:  Expected time:  Means of arrival:  Comments: 

## 2017-04-25 NOTE — ED Notes (Signed)
I gave MD Cardama critical I Stat chem 8 results

## 2017-04-25 NOTE — ED Provider Notes (Signed)
[place] DEPT Provider Note   CSN: [id number] Arrival date & time: 04/25/17  2047     History   Chief Complaint Chief Complaint  Patient presents with  . Hyperglycemia    HPI Mike [name] is a 57 y.o. male who presents with hyperglycemia.  Past medical history significant for insulin-dependent type 2 diabetes, homelessness, medical noncompliance, history of DKA, super utilizer of the ED.  Patient states that he is here because his blood sugar is high.  He states he has been taking his insulin.  He is a poor historian and his main complaint is that "I have to pee".  He denies chest pain, shortness of breath, abdominal pain.  HPI  Past Medical History:  Diagnosis Date  . Anxiety   . Anxiety   . Chronic lower back pain   . Depression   . DKA (diabetic ketoacidoses) ([place]) 07/30/2016  . Hyperlipemia   . Hypertension   . Migraine    "last one was ~ 4 yr ago" (12/23/2014)  . Seizures ([place])    "related to pills for anxiety; if I don't take the pills I'm suppose to take I'll have them" (12/23/2014)  . Type II diabetes mellitus [facility])     Patient Active Problem List   Diagnosis Date Noted  . AKI (acute kidney injury) ([place]) 03/04/2017  . Malnutrition of moderate degree 02/08/2017  . DKA (diabetic ketoacidoses) ([place]) 02/07/2017  . History of stroke 02/07/2017  . Hyperphosphatemia 12/13/2016  . Evaluation by psychiatric service required 11/30/2016  . Lactic acidosis 11/18/2016  . Normocytic anemia 11/06/2016  . HLD (hyperlipidemia) 11/06/2016  . Adjustment disorder with other symptoms 11/05/2016  . Uncontrolled type 2 diabetes mellitus with hyperosmolar nonketotic hyperglycemia ([place]) 08/24/2016  . Essential hypertension 08/24/2016  . Acute ischemic stroke ([place])   . Protein-calorie malnutrition, severe 12/24/2014  . Homelessness 12/23/2014  . Hypertensive urgency 12/23/2014  . DM (diabetes mellitus) ([place]) 12/23/2014  . Intermittent  palpitations 12/23/2014  . Tobacco abuse 12/23/2014  . Pleuritic chest pain 12/23/2014  . Abnormal EKG 12/23/2014  . Type II diabetes mellitus with renal manifestations [facility])     Past Surgical History:  Procedure Laterality Date  . NO PAST SURGERIES          Home Medications    Prior to Admission medications   Medication Sig Start Date End Date Taking? Authorizing Provider  aspirin 325 MG tablet Take 1 tablet (325 mg total) by mouth daily. 03/08/17  Yes Georgette Shell, MD  atorvastatin (LIPITOR) 40 MG tablet Take 1 tablet (40 mg total) by mouth daily. 01/26/17  Yes Cardama, Grayce Sessions, MD  ferrous sulfate 325 (65 FE) MG tablet Take 1 tablet (325 mg total) daily by mouth. Patient taking differently: Take 325 mg by mouth daily as needed (low iron).  11/23/16  Yes Ward, Cyril Mourning N, DO  insulin aspart protamine- aspart (NOVOLOG MIX 70/30) (70-30) 100 UNIT/ML injection Inject 0.3 mLs (30 Units total) into the skin 2 (two) times daily with a meal. 04/21/17  Yes Ward, Kristen N, DO  lisinopril (PRINIVIL,ZESTRIL) 40 MG tablet Take 1 tablet (40 mg total) by mouth daily. 01/26/17  Yes Cardama, Grayce Sessions, MD  metoprolol tartrate (LOPRESSOR) 25 MG tablet Take 0.5 tablets (12.5 mg total) by mouth 2 (two) times daily. 03/07/17  Yes Georgette Shell, MD  blood glucose meter kit and supplies KIT Dispense based on patient and insurance preference. Use up to four times daily as directed. (FOR ICD-9 250.00,  250.01). 11/22/16   Velvet Bathe, MD    Family History Family History  Problem Relation Age of Onset  . Diabetes Mellitus II Mother   . Diabetes Mellitus II Father     Social History Social History   Tobacco Use  . Smoking status: Current Every Day Smoker    Packs/day: 0.10    Years: 40.00    Pack years: 4.00    Types: Cigarettes  . Smokeless tobacco: Never Used  Substance Use Topics  . Alcohol use: Yes    Alcohol/week: 1.2 oz    Types: 2 Cans of beer per week  . Drug use:  No    Comment: 12/23/2014 "stopped ~ 10 yrs ago"     Allergies   Ibuprofen and Tylenol [acetaminophen]   Review of Systems Review of Systems  Constitutional: Negative for fever.  Respiratory: Negative for shortness of breath.   Cardiovascular: Negative for chest pain.  Gastrointestinal: Negative for abdominal pain.  Endocrine: Positive for polyuria.     Physical Exam Updated Vital Signs BP (!) 178/83 (BP Location: Left Arm)   Pulse 88   Temp 98 F (36.7 C) (Oral)   Resp 18   Ht 5' 8" (1.727 m)   Wt 77.1 kg (170 lb)   SpO2 100%   BMI 25.85 kg/m   Physical Exam  Constitutional: He is oriented to person, place, and time. He appears well-developed and well-nourished. No distress.  Disheveled. He has urinated on himself and the floor  HENT:  Head: Normocephalic and atraumatic.  Eyes: Pupils are equal, round, and reactive to light. Conjunctivae are normal. Right eye exhibits no discharge. Left eye exhibits no discharge. No scleral icterus.  Neck: Normal range of motion.  Cardiovascular: Normal rate and regular rhythm.  Pulmonary/Chest: Effort normal and breath sounds normal. No respiratory distress.  Abdominal: Soft. Bowel sounds are normal. He exhibits no distension. There is no tenderness.  Neurological: He is alert and oriented to person, place, and time.  Skin: Skin is warm and dry.  Psychiatric: His affect is blunt. He is slowed.  Nursing note and vitals reviewed.    ED Treatments / Results  Labs (all labs ordered are listed, but only abnormal results are displayed) Labs Reviewed  CBG MONITORING, ED - Abnormal; Notable for the following components:      Result Value   Glucose-Capillary >600 (*)    All other components within normal limits  I-STAT CHEM 8, ED - Abnormal; Notable for the following components:   Sodium 131 (*)    Chloride 92 (*)    Glucose, Bld >700 (*)    Hemoglobin 12.9 (*)    HCT 38.0 (*)    All other components within normal limits     EKG None  Radiology No results found.  Procedures Procedures (including critical care time)  Medications Ordered in ED Medications  insulin aspart protamine- aspart (NOVOLOG MIX 70/30) injection 8 Units (8 Units Subcutaneous Given 04/25/17 2155)     Initial Impression / Assessment and Plan / ED Course  I have reviewed the triage vital signs and the nursing notes.  Pertinent labs & imaging results that were available during my care of the patient were reviewed by me and considered in my medical decision making (see chart for details).  57 year old male presents with hyperglycemia. He is hypertensive but otherwise vitals are normal. CBG is reading >600 which is typical. I-stat Chem 8 is at his baseline. Calculated anion gap is 10. He was  given SQ insulin and discharged.  Final Clinical Impressions(s) / ED Diagnoses   Final diagnoses:  Hyperglycemia    ED Discharge Orders    None       Iris Pert 04/25/17 2213    Dorie Rank, MD 04/25/17 2347

## 2017-04-26 ENCOUNTER — Other Ambulatory Visit: Payer: Self-pay

## 2017-04-26 ENCOUNTER — Emergency Department (HOSPITAL_COMMUNITY)
Admission: EM | Admit: 2017-04-26 | Discharge: 2017-04-27 | Disposition: A | Discharge status: Home / Self Care | Payer: Self-pay | Attending: Emergency Medicine | Admitting: Emergency Medicine

## 2017-04-26 DIAGNOSIS — Z794 Long term (current) use of insulin: Secondary | ICD-10-CM | POA: Insufficient documentation

## 2017-04-26 DIAGNOSIS — I10 Essential (primary) hypertension: Secondary | ICD-10-CM | POA: Insufficient documentation

## 2017-04-26 DIAGNOSIS — F17210 Nicotine dependence, cigarettes, uncomplicated: Secondary | ICD-10-CM | POA: Insufficient documentation

## 2017-04-26 DIAGNOSIS — Z590 Homelessness: Secondary | ICD-10-CM | POA: Insufficient documentation

## 2017-04-26 DIAGNOSIS — R739 Hyperglycemia, unspecified: Secondary | ICD-10-CM

## 2017-04-26 DIAGNOSIS — E1165 Type 2 diabetes mellitus with hyperglycemia: Secondary | ICD-10-CM | POA: Insufficient documentation

## 2017-04-26 LAB — I-STAT CHEM 8, ED
BUN: 16 mg/dL (ref 6–20)
CALCIUM ION: 1.17 mmol/L (ref 1.15–1.40)
CHLORIDE: 95 mmol/L — AB (ref 101–111)
Creatinine, Ser: 0.9 mg/dL (ref 0.61–1.24)
HEMATOCRIT: 36 % — AB (ref 39.0–52.0)
Hemoglobin: 12.2 g/dL — ABNORMAL LOW (ref 13.0–17.0)
POTASSIUM: 4.6 mmol/L (ref 3.5–5.1)
SODIUM: 135 mmol/L (ref 135–145)
TCO2: 29 mmol/L (ref 22–32)

## 2017-04-26 MED ORDER — INSULIN ASPART 100 UNIT/ML ~~LOC~~ SOLN
10.0000 [IU] | Freq: Once | SUBCUTANEOUS | Status: AC
  Administered 2017-04-26: 10 [IU] via SUBCUTANEOUS
  Filled 2017-04-26: qty 1

## 2017-04-26 MED ORDER — INSULIN ASPART PROT & ASPART (70-30 MIX) 100 UNIT/ML ~~LOC~~ SUSP
30.0000 [IU] | Freq: Two times a day (BID) | SUBCUTANEOUS | Status: DC
  Administered 2017-04-26: 30 [IU] via SUBCUTANEOUS
  Filled 2017-04-26: qty 10

## 2017-04-26 NOTE — ED Notes (Signed)
Per EMS, pt's CBG is reading "HIGH" on their meter.  Pt's POCT glucose is always elevated when he is in the ED.  [facility] 70/30 daily in the am.  Pt is calm and is cooperative.

## 2017-04-26 NOTE — ED Provider Notes (Signed)
[place] DEPT Provider Note   CSN: [id number] Arrival date & time: 04/26/17  1902     History   Chief Complaint Chief Complaint  Patient presents with  . Hyperglycemia    HPI Mike [name] is a 57 y.o. male.  HPI   Mike [name] is a 57 year old male with a history of type 2 diabetes, homelessness, medication noncompliance, hypertension, hyperlipidemia who presents to the emergency department for hyperglycemia.  He has been seen for this complaint repeatedly in the ED. Reports that he checked his blood sugar this morning but cannot remember what it was.  He also reports taking insulin this afternoon, but cannot remember the dose.  States that he has not had anything to eat today and is very hungry.  He denies any physical complaints.  Denies fevers, chills, chest pain, shortness of breath, abdominal pain, nausea/vomiting.  Past Medical History:  Diagnosis Date  . Anxiety   . Anxiety   . Chronic lower back pain   . Depression   . DKA (diabetic ketoacidoses) ([place]) 07/30/2016  . Hyperlipemia   . Hypertension   . Migraine    "last one was ~ 4 yr ago" (12/23/2014)  . Seizures ([place])    "related to pills for anxiety; if I don't take the pills I'm suppose to take I'll have them" (12/23/2014)  . Type II diabetes mellitus [facility])     Patient Active Problem List   Diagnosis Date Noted  . AKI (acute kidney injury) ([place]) 03/04/2017  . Malnutrition of moderate degree 02/08/2017  . DKA (diabetic ketoacidoses) ([place]) 02/07/2017  . History of stroke 02/07/2017  . Hyperphosphatemia 12/13/2016  . Evaluation by psychiatric service required 11/30/2016  . Lactic acidosis 11/18/2016  . Normocytic anemia 11/06/2016  . HLD (hyperlipidemia) 11/06/2016  . Adjustment disorder with other symptoms 11/05/2016  . Uncontrolled type 2 diabetes mellitus with hyperosmolar nonketotic hyperglycemia ([place]) 08/24/2016  . Essential hypertension 08/24/2016  . Acute ischemic  stroke ([place])   . Protein-calorie malnutrition, severe 12/24/2014  . Homelessness 12/23/2014  . Hypertensive urgency 12/23/2014  . DM (diabetes mellitus) ([place]) 12/23/2014  . Intermittent palpitations 12/23/2014  . Tobacco abuse 12/23/2014  . Pleuritic chest pain 12/23/2014  . Abnormal EKG 12/23/2014  . Type II diabetes mellitus with renal manifestations [facility])     Past Surgical History:  Procedure Laterality Date  . NO PAST SURGERIES          Home Medications    Prior to Admission medications   Medication Sig Start Date End Date Taking? Authorizing Provider  aspirin 325 MG tablet Take 1 tablet (325 mg total) by mouth daily. 03/08/17   Georgette Shell, MD  atorvastatin (LIPITOR) 40 MG tablet Take 1 tablet (40 mg total) by mouth daily. 01/26/17   Cardama, Grayce Sessions, MD  blood glucose meter kit and supplies KIT Dispense based on patient and insurance preference. Use up to four times daily as directed. (FOR ICD-9 250.00, 250.01). 11/22/16   Velvet Bathe, MD  ferrous sulfate 325 (65 FE) MG tablet Take 1 tablet (325 mg total) daily by mouth. Patient taking differently: Take 325 mg by mouth daily as needed (low iron).  11/23/16   Ward, Delice Bison, DO  insulin aspart protamine- aspart (NOVOLOG MIX 70/30) (70-30) 100 UNIT/ML injection Inject 0.3 mLs (30 Units total) into the skin 2 (two) times daily with a meal. 04/21/17   Ward, Delice Bison, DO  lisinopril (PRINIVIL,ZESTRIL) 40 MG tablet Take 1 tablet (40 mg total) by  mouth daily. 01/26/17   Fatima Blank, MD  metoprolol tartrate (LOPRESSOR) 25 MG tablet Take 0.5 tablets (12.5 mg total) by mouth 2 (two) times daily. 03/07/17   Georgette Shell, MD    Family History Family History  Problem Relation Age of Onset  . Diabetes Mellitus II Mother   . Diabetes Mellitus II Father     Social History Social History   Tobacco Use  . Smoking status: Current Every Day Smoker    Packs/day: 0.10    Years: 40.00    Pack years: 4.00      Types: Cigarettes  . Smokeless tobacco: Never Used  Substance Use Topics  . Alcohol use: Yes    Alcohol/week: 1.2 oz    Types: 2 Cans of beer per week  . Drug use: No    Comment: 12/23/2014 "stopped ~ 10 yrs ago"     Allergies   Ibuprofen and Tylenol [acetaminophen]   Review of Systems Review of Systems  Constitutional: Negative for chills and fever.  Respiratory: Negative for shortness of breath.   Cardiovascular: Negative for chest pain.  Gastrointestinal: Negative for abdominal pain, nausea and vomiting.  Endocrine: Negative for polyuria.  Genitourinary: Negative for dysuria.  Musculoskeletal: Negative for gait problem.  Skin: Negative for rash.  Neurological: Negative for dizziness, light-headedness and headaches.  Psychiatric/Behavioral: Negative for agitation.     Physical Exam Updated Vital Signs BP (!) 145/88 (BP Location: Left Arm)   Pulse 84   Temp 99.1 F (37.3 C) (Oral)   Resp 17   Ht '5\' 8"'$  (1.727 m)   Wt 77.1 kg (170 lb)   SpO2 98%   BMI 25.85 kg/m   Physical Exam  Constitutional: He appears well-developed and well-nourished. No distress.  Flat affect. No acute distress.   HENT:  Head: Normocephalic and atraumatic.  Mouth/Throat: Oropharynx is clear and moist. No oropharyngeal exudate.  Eyes: Pupils are equal, round, and reactive to light. Conjunctivae are normal. Right eye exhibits no discharge. Left eye exhibits no discharge.  Neck: Normal range of motion. Neck supple.  Cardiovascular: Normal rate, regular rhythm and intact distal pulses. Exam reveals no friction rub.  No murmur heard. Pulmonary/Chest: Effort normal and breath sounds normal. No stridor. No respiratory distress. He has no wheezes. He has no rales.  Abdominal: Soft. Bowel sounds are normal. There is no tenderness. There is no guarding.  Musculoskeletal: Normal range of motion.  Neurological: He is alert. Coordination normal.  Skin: Skin is warm and dry. He is not diaphoretic.   Psychiatric: He has a normal mood and affect. His behavior is normal.  Nursing note and vitals reviewed.    ED Treatments / Results  Labs (all labs ordered are listed, but only abnormal results are displayed) Labs Reviewed  I-STAT CHEM 8, ED - Abnormal; Notable for the following components:      Result Value   Chloride 95 (*)    Glucose, Bld >700 (*)    Hemoglobin 12.2 (*)    HCT 36.0 (*)    All other components within normal limits    EKG None  Radiology No results found.  Procedures Procedures (including critical care time)  Medications Ordered in ED Medications  insulin aspart (novoLOG) injection 10 Units (10 Units Subcutaneous Given 04/26/17 2154)     Initial Impression / Assessment and Plan / ED Course  I have reviewed the triage vital signs and the nursing notes.  Pertinent labs & imaging results that were available during my  care of the patient were reviewed by me and considered in my medical decision making (see chart for details).     Patient well known in the ED for hyperglycemia and medication non-compliance presents with hyperglycemia. Chem-8 reveals glucose >700. This is unfortunately a normal reading for patient when he presents to the ER. No acidosis. No signs of DKA. Discussed this patient with Dr. Tomi Bamberger who also saw the patient and agrees with [place] insulin and discharge. Patient informed to follow up with [facility] for further diabetic management.   Final Clinical Impressions(s) / ED Diagnoses   Final diagnoses:  Hyperglycemia    ED Discharge Orders    None       Bernarda Caffey 04/27/17 1405    Dorie Rank, MD 04/29/17 1743

## 2017-04-26 NOTE — ED Triage Notes (Signed)
Per EMS: Pt walked up to [employer] asking for EMS to come pick him up. Ems report CBG greater than 600. Pt c/o of vomiting two hours prior.

## 2017-04-26 NOTE — Discharge Instructions (Addendum)
Please take your prescribed insulin at home.   Return to the ER if you have any new or concerning symptoms like trouble breathing, vomiting.

## 2017-04-26 NOTE — ED Provider Notes (Signed)
Pt presents with recurrent hyperglycemia.  Well known to the ED.  Blood sugar is significantly elevated again.  NO signs of DKA  Medical screening examination/treatment/procedure(s) were performed by non-physician practitioner and as supervising physician I was immediately available for consultation/collaboration.     Linwood DibblesKnapp, Tony Granquist, MD 04/26/17 2016

## 2017-04-27 ENCOUNTER — Emergency Department (HOSPITAL_COMMUNITY)
Admission: EM | Admit: 2017-04-27 | Discharge: 2017-04-27 | Disposition: A | Discharge status: Home / Self Care | Payer: Self-pay | Attending: Emergency Medicine | Admitting: Emergency Medicine

## 2017-04-27 ENCOUNTER — Encounter (HOSPITAL_COMMUNITY): Payer: Self-pay | Admitting: Emergency Medicine

## 2017-04-27 ENCOUNTER — Other Ambulatory Visit: Payer: Self-pay

## 2017-04-27 DIAGNOSIS — I10 Essential (primary) hypertension: Secondary | ICD-10-CM | POA: Insufficient documentation

## 2017-04-27 DIAGNOSIS — Z79899 Other long term (current) drug therapy: Secondary | ICD-10-CM | POA: Insufficient documentation

## 2017-04-27 DIAGNOSIS — Z794 Long term (current) use of insulin: Secondary | ICD-10-CM | POA: Insufficient documentation

## 2017-04-27 DIAGNOSIS — Z7982 Long term (current) use of aspirin: Secondary | ICD-10-CM | POA: Insufficient documentation

## 2017-04-27 DIAGNOSIS — E1165 Type 2 diabetes mellitus with hyperglycemia: Secondary | ICD-10-CM | POA: Insufficient documentation

## 2017-04-27 DIAGNOSIS — F17210 Nicotine dependence, cigarettes, uncomplicated: Secondary | ICD-10-CM | POA: Insufficient documentation

## 2017-04-27 DIAGNOSIS — R739 Hyperglycemia, unspecified: Secondary | ICD-10-CM

## 2017-04-27 LAB — I-STAT CHEM 8, ED
BUN: 30 mg/dL — ABNORMAL HIGH (ref 6–20)
Calcium, Ion: 1.07 mmol/L — ABNORMAL LOW (ref 1.15–1.40)
Chloride: 98 mmol/L — ABNORMAL LOW (ref 101–111)
Creatinine, Ser: 0.8 mg/dL (ref 0.61–1.24)
Glucose, Bld: >700 mg/dL (ref 65–99)
HCT: 38 % — ABNORMAL LOW (ref 39.0–52.0)
Hemoglobin: 12.9 g/dL — ABNORMAL LOW (ref 13.0–17.0)
Potassium: 5 mmol/L (ref 3.5–5.1)
Sodium: 133 mmol/L — ABNORMAL LOW (ref 135–145)
TCO2: 30 mmol/L (ref 22–32)

## 2017-04-27 LAB — CBG MONITORING, ED: Glucose-Capillary: >600 mg/dL (ref 65–99)

## 2017-04-27 MED ORDER — INSULIN ASPART PROT & ASPART (70-30 MIX) 100 UNIT/ML ~~LOC~~ SUSP
10.0000 [IU] | Freq: Once | SUBCUTANEOUS | Status: AC
  Administered 2017-04-27: 10 [IU] via SUBCUTANEOUS
  Filled 2017-04-27: qty 10

## 2017-04-27 NOTE — Discharge Instructions (Signed)
Follow-up with your primary care physician for reevaluation.  Take your insulin as prescribed.  Take all your other medications as prescribed.  Eat a diet low in carbohydrates.  Return to the emergency department if any concerning signs or symptoms develop.

## 2017-04-27 NOTE — ED Notes (Signed)
Pt is requesting food and a coke

## 2017-04-27 NOTE — ED Notes (Addendum)
Pt ambulatory in triage with [employer]. Unable to get signature.

## 2017-04-27 NOTE — ED Triage Notes (Signed)
Per EMS pt requested for someone to call EMS for hyperglycemia from [facility]. CBG >600.

## 2017-04-27 NOTE — ED Notes (Signed)
Pt given a belt from chaplains office.

## 2017-04-27 NOTE — ED Provider Notes (Signed)
[facility] DEPT Provider Note   CSN: [id number] Arrival date & time: 04/27/17  1937     History   Chief Complaint Chief Complaint  Patient presents with  . Hyperglycemia    HPI Mike [name] is a 57 y.o. male Exam with history of hypertension, hyperlipidemia, homelessness, medication noncompliance, insulin-dependent type 2 diabetes presents today for evaluation for his usual complaint of hyperglycemia.  He has been seen 61 times in the last 6 months for similar complaints.  He has a care plan in place for  his hyperglycemia.  He states that he took his insulin today but does not know how much he took.  He states that this is administered by the [facility].  He states that he took his blood sugar this morning but was not sure what it was.  He states he has not had anything to eat or drink today.  He denies any pain, nausea, vomiting, confusion, fevers, or other physical complaints.   t The history is provided by the patient.    Past Medical History:  Diagnosis Date  . Anxiety   . Anxiety   . Chronic lower back pain   . Depression   . DKA (diabetic ketoacidoses) ([place]) 07/30/2016  . Hyperlipemia   . Hypertension   . Migraine    "last one was ~ 4 yr ago" (12/23/2014)  . Seizures ([place])    "related to pills for anxiety; if I don't take the pills I'm suppose to take I'll have them" (12/23/2014)  . Type II diabetes mellitus [facility])     Patient Active Problem List   Diagnosis Date Noted  . AKI (acute kidney injury) ([place]) 03/04/2017  . Malnutrition of moderate degree 02/08/2017  . DKA (diabetic ketoacidoses) ([place]) 02/07/2017  . History of stroke 02/07/2017  . Hyperphosphatemia 12/13/2016  . Evaluation by psychiatric service required 11/30/2016  . Lactic acidosis 11/18/2016  . Normocytic anemia 11/06/2016  . HLD (hyperlipidemia) 11/06/2016  . Adjustment disorder with other symptoms 11/05/2016  . Uncontrolled type 2 diabetes mellitus with hyperosmolar  nonketotic hyperglycemia ([place]) 08/24/2016  . Essential hypertension 08/24/2016  . Acute ischemic stroke ([place])   . Protein-calorie malnutrition, severe 12/24/2014  . Homelessness 12/23/2014  . Hypertensive urgency 12/23/2014  . DM (diabetes mellitus) ([place]) 12/23/2014  . Intermittent palpitations 12/23/2014  . Tobacco abuse 12/23/2014  . Pleuritic chest pain 12/23/2014  . Abnormal EKG 12/23/2014  . Type II diabetes mellitus with renal manifestations [facility])     Past Surgical History:  Procedure Laterality Date  . NO PAST SURGERIES          Home Medications    Prior to Admission medications   Medication Sig Start Date End Date Taking? Authorizing Provider  aspirin 325 MG tablet Take 1 tablet (325 mg total) by mouth daily. 03/08/17   Georgette Shell, MD  atorvastatin (LIPITOR) 40 MG tablet Take 1 tablet (40 mg total) by mouth daily. 01/26/17   Cardama, Grayce Sessions, MD  blood glucose meter kit and supplies KIT Dispense based on patient and insurance preference. Use up to four times daily as directed. (FOR ICD-9 250.00, 250.01). 11/22/16   Velvet Bathe, MD  ferrous sulfate 325 (65 FE) MG tablet Take 1 tablet (325 mg total) daily by mouth. Patient taking differently: Take 325 mg by mouth daily as needed (low iron).  11/23/16   Ward, Delice Bison, DO  insulin aspart protamine- aspart (NOVOLOG MIX 70/30) (70-30) 100 UNIT/ML injection Inject 0.3 mLs (30 Units total)  into the skin 2 (two) times daily with a meal. 04/21/17   Ward, Delice Bison, DO  lisinopril (PRINIVIL,ZESTRIL) 40 MG tablet Take 1 tablet (40 mg total) by mouth daily. 01/26/17   Fatima Blank, MD  metoprolol tartrate (LOPRESSOR) 25 MG tablet Take 0.5 tablets (12.5 mg total) by mouth 2 (two) times daily. 03/07/17   Georgette Shell, MD    Family History Family History  Problem Relation Age of Onset  . Diabetes Mellitus II Mother   . Diabetes Mellitus II Father     Social History Social History   Tobacco Use  .  Smoking status: Current Every Day Smoker    Packs/day: 0.10    Years: 40.00    Pack years: 4.00    Types: Cigarettes  . Smokeless tobacco: Never Used  Substance Use Topics  . Alcohol use: Yes    Alcohol/week: 1.2 oz    Types: 2 Cans of beer per week  . Drug use: No    Comment: 12/23/2014 "stopped ~ 10 yrs ago"     Allergies   Ibuprofen and Tylenol [acetaminophen]   Review of Systems Review of Systems  Constitutional: Negative for fever.  Respiratory: Negative for shortness of breath.   Cardiovascular: Negative for chest pain.  Gastrointestinal: Negative for abdominal pain, nausea and vomiting.  Endocrine:       + Hyperglycemia  All other systems reviewed and are negative.    Physical Exam Updated Vital Signs BP (!) 178/88 (BP Location: Left Arm)   Pulse 78   Temp 98 F (36.7 C) (Oral)   Resp 20   Ht _0  (1.727 m)   Wt 77.1 kg (170 lb)   SpO2 99%   BMI 25.85 kg/m    Physical Exam  Constitutional: He appears well-developed and well-nourished. No distress.  Resting in chair, thin.  Appearance is unkempt and clothing is malodorous.  HENT:  Head: Normocephalic and atraumatic.  Eyes: Conjunctivae are normal. Right eye exhibits no discharge. Left eye exhibits no discharge.  Neck: No JVD present. No tracheal deviation present.  Cardiovascular: Normal rate and regular rhythm.  Pulmonary/Chest: Effort normal and breath sounds normal.  Abdominal: Soft. Bowel sounds are normal. He exhibits no distension. There is no tenderness.  Musculoskeletal: He exhibits no edema.  Neurological: He is alert.  Somewhat slow to answer questions but answers them appropriately.  This is the patient's baseline and is chronic and unchanged.  Skin: Skin is warm and dry. No erythema.  Psychiatric: He has a normal mood and affect. His behavior is normal.  Nursing note and vitals reviewed.    ED Treatments / Results  Labs (all labs ordered are listed, but only abnormal results are  displayed) Labs Reviewed  CBG MONITORING, ED - Abnormal; Notable for the following components:      Result Value   Glucose-Capillary >600 (*)    All other components within normal limits  I-STAT CHEM 8, ED - Abnormal; Notable for the following components:   Sodium 133 (*)    Chloride 98 (*)    BUN 30 (*)    Glucose, Bld >700 (*)    Calcium, Ion 1.07 (*)    Hemoglobin 12.9 (*)    HCT 38.0 (*)    All other components within normal limits    EKG None  Radiology No results found.  Procedures Procedures (including critical care time)  Medications Ordered in ED Medications  insulin aspart protamine- aspart (NOVOLOG MIX 70/30) injection 10 Units (10  Units Subcutaneous Given 04/27/17 2057)     Initial Impression / Assessment and Plan / ED Course  I have reviewed the triage vital signs and the nursing notes.  Pertinent labs & imaging results that were available during my care of the patient were reviewed by me and considered in my medical decision making (see chart for details).     Patient presents for evaluation of hyperglycemia.  His vital signs are at his baseline and he is nontoxic in appearance.  He is well-known to the ED and most recently has been here every day for the past 6 days.  No signs or symptoms of DKA, calculated anion gap of 5 today.  Blood sugar greater than 700 on lab work which is unfortunately the patient's baseline.  Remainder of lab work is at patient's baseline.  He was given 10 units of subcutaneous insulin.  He is stable for discharge at this time.  Encouraged the patient to be compliant with his medications, eat a low-carb diet regularly and follow-up with his PCP.  He verbalized understanding of this and agreement with plan.  Final Clinical Impressions(s) / ED Diagnoses   Final diagnoses:  Hyperglycemia    ED Discharge Orders    None      Debroah Baller 04/27/17 2104  Charlesetta Shanks, MD 04/30/17 [phone]

## 2017-04-27 NOTE — ED Notes (Signed)
Bed: WTR6 Expected date:  Expected time:  Means of arrival:  Comments: EMS [facility]

## 2017-04-28 ENCOUNTER — Emergency Department (HOSPITAL_COMMUNITY)
Admission: EM | Admit: 2017-04-28 | Discharge: 2017-04-28 | Disposition: A | Discharge status: Home / Self Care | Payer: Self-pay | Attending: Emergency Medicine | Admitting: Emergency Medicine

## 2017-04-28 ENCOUNTER — Emergency Department (HOSPITAL_COMMUNITY)
Admission: EM | Admit: 2017-04-28 | Discharge: 2017-04-28 | Disposition: A | Discharge status: Home / Self Care | Payer: Self-pay | Attending: Physician Assistant | Admitting: Physician Assistant

## 2017-04-28 ENCOUNTER — Encounter (HOSPITAL_COMMUNITY): Payer: Self-pay | Admitting: Emergency Medicine

## 2017-04-28 DIAGNOSIS — Z7982 Long term (current) use of aspirin: Secondary | ICD-10-CM | POA: Insufficient documentation

## 2017-04-28 DIAGNOSIS — F329 Major depressive disorder, single episode, unspecified: Secondary | ICD-10-CM | POA: Insufficient documentation

## 2017-04-28 DIAGNOSIS — Z9119 Patient's noncompliance with other medical treatment and regimen: Secondary | ICD-10-CM | POA: Insufficient documentation

## 2017-04-28 DIAGNOSIS — I10 Essential (primary) hypertension: Secondary | ICD-10-CM | POA: Insufficient documentation

## 2017-04-28 DIAGNOSIS — Z794 Long term (current) use of insulin: Secondary | ICD-10-CM | POA: Insufficient documentation

## 2017-04-28 DIAGNOSIS — Z590 Homelessness: Secondary | ICD-10-CM | POA: Insufficient documentation

## 2017-04-28 DIAGNOSIS — Z8673 Personal history of transient ischemic attack (TIA), and cerebral infarction without residual deficits: Secondary | ICD-10-CM | POA: Insufficient documentation

## 2017-04-28 DIAGNOSIS — E1165 Type 2 diabetes mellitus with hyperglycemia: Secondary | ICD-10-CM | POA: Insufficient documentation

## 2017-04-28 DIAGNOSIS — F419 Anxiety disorder, unspecified: Secondary | ICD-10-CM | POA: Insufficient documentation

## 2017-04-28 DIAGNOSIS — F17210 Nicotine dependence, cigarettes, uncomplicated: Secondary | ICD-10-CM | POA: Insufficient documentation

## 2017-04-28 DIAGNOSIS — R739 Hyperglycemia, unspecified: Secondary | ICD-10-CM

## 2017-04-28 DIAGNOSIS — Z79899 Other long term (current) drug therapy: Secondary | ICD-10-CM | POA: Insufficient documentation

## 2017-04-28 DIAGNOSIS — Z91199 Patient's noncompliance with other medical treatment and regimen due to unspecified reason: Secondary | ICD-10-CM

## 2017-04-28 LAB — I-STAT CHEM 8, ED
BUN: 20 mg/dL (ref 6–20)
Calcium, Ion: 1.11 mmol/L — ABNORMAL LOW (ref 1.15–1.40)
Chloride: 90 mmol/L — ABNORMAL LOW (ref 101–111)
Creatinine, Ser: 0.8 mg/dL (ref 0.61–1.24)
Glucose, Bld: >700 mg/dL (ref 65–99)
HEMATOCRIT: 40 % (ref 39.0–52.0)
HEMOGLOBIN: 13.6 g/dL (ref 13.0–17.0)
Potassium: 5 mmol/L (ref 3.5–5.1)
SODIUM: 129 mmol/L — AB (ref 135–145)
TCO2: 28 mmol/L (ref 22–32)

## 2017-04-28 LAB — CBG MONITORING, ED: Glucose-Capillary: >600 mg/dL (ref 65–99)

## 2017-04-28 MED ORDER — INSULIN ASPART PROT & ASPART (70-30 MIX) 100 UNIT/ML ~~LOC~~ SUSP
10.0000 [IU] | Freq: Once | SUBCUTANEOUS | Status: AC
  Administered 2017-04-28: 10 [IU] via SUBCUTANEOUS
  Filled 2017-04-28: qty 10

## 2017-04-28 NOTE — Discharge Instructions (Addendum)
You are seen today again with high sugars.  Again we recommend that you check your sugars at home and use insulin as prescribed.  [facility].

## 2017-04-28 NOTE — ED Provider Notes (Signed)
[facility] DEPT Provider Note   CSN: [id number] Arrival date & time: 04/28/17  1945     History   Chief Complaint Chief Complaint  Patient presents with  . Hyperglycemia    HPI Mike [name] is a 57 y.o. male. CC: hyperglycemia.  HPI: She presents less than an hour after being discharged for hyperglycemia.  He was given insulin.  He had improving blood sugars.  [place] from [place].  [place]  Went to the nurse payphone and apparently called 911 stating that his blood sugar was high.  He has multiple ER visits, 62 in the last 6 months for hyperglycemia related issues.  He is notoriously noncompliant.  Past Medical History:  Diagnosis Date  . Anxiety   . Anxiety   . Chronic lower back pain   . Depression   . DKA (diabetic ketoacidoses) ([place]) 07/30/2016  . Hyperlipemia   . Hypertension   . Migraine    "last one was ~ 4 yr ago" (12/23/2014)  . Seizures ([place])    "related to pills for anxiety; if I don't take the pills I'm suppose to take I'll have them" (12/23/2014)  . Type II diabetes mellitus [facility])     Patient Active Problem List   Diagnosis Date Noted  . AKI (acute kidney injury) ([place]) 03/04/2017  . Malnutrition of moderate degree 02/08/2017  . DKA (diabetic ketoacidoses) ([place]) 02/07/2017  . History of stroke 02/07/2017  . Hyperphosphatemia 12/13/2016  . Evaluation by psychiatric service required 11/30/2016  . Lactic acidosis 11/18/2016  . Normocytic anemia 11/06/2016  . HLD (hyperlipidemia) 11/06/2016  . Adjustment disorder with other symptoms 11/05/2016  . Uncontrolled type 2 diabetes mellitus with hyperosmolar nonketotic hyperglycemia ([place]) 08/24/2016  . Essential hypertension 08/24/2016  . Acute ischemic stroke ([place])   . Protein-calorie malnutrition, severe 12/24/2014  . Homelessness 12/23/2014  . Hypertensive urgency 12/23/2014  . DM (diabetes mellitus) ([place]) 12/23/2014  . Intermittent palpitations 12/23/2014   . Tobacco abuse 12/23/2014  . Pleuritic chest pain 12/23/2014  . Abnormal EKG 12/23/2014  . Type II diabetes mellitus with renal manifestations [facility])     Past Surgical History:  Procedure Laterality Date  . NO PAST SURGERIES          Home Medications    Prior to Admission medications   Medication Sig Start Date End Date Taking? Authorizing Provider  aspirin 325 MG tablet Take 1 tablet (325 mg total) by mouth daily. 03/08/17   Georgette Shell, MD  atorvastatin (LIPITOR) 40 MG tablet Take 1 tablet (40 mg total) by mouth daily. 01/26/17   Cardama, Grayce Sessions, MD  blood glucose meter kit and supplies KIT Dispense based on patient and insurance preference. Use up to four times daily as directed. (FOR ICD-9 250.00, 250.01). 11/22/16   Velvet Bathe, MD  ferrous sulfate 325 (65 FE) MG tablet Take 1 tablet (325 mg total) daily by mouth. Patient taking differently: Take 325 mg by mouth daily as needed (low iron).  11/23/16   Ward, Delice Bison, DO  insulin aspart protamine- aspart (NOVOLOG MIX 70/30) (70-30) 100 UNIT/ML injection Inject 0.3 mLs (30 Units total) into the skin 2 (two) times daily with a meal. 04/21/17   Ward, Delice Bison, DO  lisinopril (PRINIVIL,ZESTRIL) 40 MG tablet Take 1 tablet (40 mg total) by mouth daily. 01/26/17   Fatima Blank, MD  metoprolol tartrate (LOPRESSOR) 25 MG tablet Take 0.5 tablets (12.5 mg total) by mouth 2 (two) times  daily. 03/07/17   Georgette Shell, MD    Family History Family History  Problem Relation Age of Onset  . Diabetes Mellitus II Mother   . Diabetes Mellitus II Father     Social History Social History   Tobacco Use  . Smoking status: Current Every Day Smoker    Packs/day: 0.10    Years: 40.00    Pack years: 4.00    Types: Cigarettes  . Smokeless tobacco: Never Used  Substance Use Topics  . Alcohol use: Yes    Alcohol/week: 1.2 oz    Types: 2 Cans of beer per week  . Drug use: No    Comment: 12/23/2014 "stopped ~ 10  yrs ago"     Allergies   Ibuprofen and Tylenol [acetaminophen]   Review of Systems Review of Systems  Constitutional: Negative for appetite change, chills, diaphoresis, fatigue and fever.  HENT: Negative for mouth sores, sore throat and trouble swallowing.   Eyes: Negative for visual disturbance.  Respiratory: Negative for cough, chest tightness, shortness of breath and wheezing.   Cardiovascular: Negative for chest pain.  Gastrointestinal: Positive for diarrhea. Negative for abdominal distention, abdominal pain, nausea and vomiting.  Endocrine: Positive for polyuria. Negative for polydipsia and polyphagia.  Genitourinary: Negative for dysuria, frequency and hematuria.  Musculoskeletal: Negative for gait problem.  Skin: Negative for color change, pallor and rash.  Neurological: Negative for dizziness, syncope, light-headedness and headaches.  Hematological: Does not bruise/bleed easily.  Psychiatric/Behavioral: Negative for behavioral problems and confusion.     Physical Exam Updated Vital Signs There were no vitals taken for this visit.  Physical Exam  Constitutional: He is oriented to person, place, and time. He appears well-developed and well-nourished. No distress.  HENT:  Head: Normocephalic.  Eyes: Pupils are equal, round, and reactive to light. Conjunctivae are normal. No scleral icterus.  Neck: Normal range of motion. Neck supple. No thyromegaly present.  Cardiovascular: Normal rate and regular rhythm. Exam reveals no gallop and no friction rub.  No murmur heard. Pulmonary/Chest: Effort normal and breath sounds normal. No respiratory distress. He has no wheezes. He has no rales.  Abdominal: Soft. Bowel sounds are normal. He exhibits no distension. There is no tenderness. There is no rebound.  Musculoskeletal: Normal range of motion.  Neurological: He is alert and oriented to person, place, and time.  Skin: Skin is warm and dry. No rash noted.  Psychiatric:  Flat  affect.   Fully, and appropriately closed for the weather  ED Treatments / Results  Labs (all labs ordered are listed, but only abnormal results are displayed) Labs Reviewed - No data to display  EKG None  Radiology No results found.  Procedures Procedures (including critical care time)  Medications Ordered in ED Medications - No data to display   Initial Impression / Assessment and Plan / ED Course  I have reviewed the triage vital signs and the nursing notes.  Pertinent labs & imaging results that were available during my care of the patient were reviewed by me and considered in my medical decision making (see chart for details).   Screening exam performed.  Blood sugar 403.  Normal vital signs.  No indication for acute management at this point.  He has been seen in earlier evaluated and not in ketoacidosis.  Discharged.  Compliance urged.  Final Clinical Impressions(s) / ED Diagnoses   Final diagnoses:  Hyperglycemia  Noncompliance    ED Discharge Orders    None  Tanna Furry, MD 04/28/17 [phone]

## 2017-04-28 NOTE — ED Notes (Signed)
Bed: WLPT3 Expected date:  Expected time:  Means of arrival:  Comments: 

## 2017-04-28 NOTE — ED Provider Notes (Signed)
[facility] DEPT Provider Note   CSN: [id number] Arrival date & time: 04/28/17  1348     History   Chief Complaint Chief Complaint  Patient presents with  . Hyperglycemia    HPI Levoy Larsh is a 57 y.o. male.  HPI   57 year old male presenting to the emergency room today with elevated blood sugars.  Patient has had 62 visits in the last 6 months.  Patient had daily visits for the last 2-1/2 weeks.  Patient's blood sugar per EMS was 324, on ours it was above 600.  Will do Chem-8.  Instructions on patient's care plan is to check a Chem-8, subcu insulin and discharge home.  Patient appears baseline.  Normal vital signs.  Past Medical History:  Diagnosis Date  . Anxiety   . Anxiety   . Chronic lower back pain   . Depression   . DKA (diabetic ketoacidoses) ([place]) 07/30/2016  . Hyperlipemia   . Hypertension   . Migraine    "last one was ~ 4 yr ago" (12/23/2014)  . Seizures ([place])    "related to pills for anxiety; if I don't take the pills I'm suppose to take I'll have them" (12/23/2014)  . Type II diabetes mellitus [facility])     Patient Active Problem List   Diagnosis Date Noted  . AKI (acute kidney injury) ([place]) 03/04/2017  . Malnutrition of moderate degree 02/08/2017  . DKA (diabetic ketoacidoses) ([place]) 02/07/2017  . History of stroke 02/07/2017  . Hyperphosphatemia 12/13/2016  . Evaluation by psychiatric service required 11/30/2016  . Lactic acidosis 11/18/2016  . Normocytic anemia 11/06/2016  . HLD (hyperlipidemia) 11/06/2016  . Adjustment disorder with other symptoms 11/05/2016  . Uncontrolled type 2 diabetes mellitus with hyperosmolar nonketotic hyperglycemia ([place]) 08/24/2016  . Essential hypertension 08/24/2016  . Acute ischemic stroke ([place])   . Protein-calorie malnutrition, severe 12/24/2014  . Homelessness 12/23/2014  . Hypertensive urgency 12/23/2014  . DM (diabetes mellitus) ([place]) 12/23/2014  . Intermittent palpitations  12/23/2014  . Tobacco abuse 12/23/2014  . Pleuritic chest pain 12/23/2014  . Abnormal EKG 12/23/2014  . Type II diabetes mellitus with renal manifestations [facility])     Past Surgical History:  Procedure Laterality Date  . NO PAST SURGERIES          Home Medications    Prior to Admission medications   Medication Sig Start Date End Date Taking? Authorizing Provider  aspirin 325 MG tablet Take 1 tablet (325 mg total) by mouth daily. 03/08/17   Georgette Shell, MD  atorvastatin (LIPITOR) 40 MG tablet Take 1 tablet (40 mg total) by mouth daily. 01/26/17   Cardama, Grayce Sessions, MD  blood glucose meter kit and supplies KIT Dispense based on patient and insurance preference. Use up to four times daily as directed. (FOR ICD-9 250.00, 250.01). 11/22/16   Velvet Bathe, MD  ferrous sulfate 325 (65 FE) MG tablet Take 1 tablet (325 mg total) daily by mouth. Patient taking differently: Take 325 mg by mouth daily as needed (low iron).  11/23/16   Ward, Delice Bison, DO  insulin aspart protamine- aspart (NOVOLOG MIX 70/30) (70-30) 100 UNIT/ML injection Inject 0.3 mLs (30 Units total) into the skin 2 (two) times daily with a meal. 04/21/17   Ward, Delice Bison, DO  lisinopril (PRINIVIL,ZESTRIL) 40 MG tablet Take 1 tablet (40 mg total) by mouth daily. 01/26/17   Fatima Blank, MD  metoprolol tartrate (LOPRESSOR) 25 MG tablet Take 0.5 tablets (12.5 mg total) by mouth  2 (two) times daily. 03/07/17   Georgette Shell, MD    Family History Family History  Problem Relation Age of Onset  . Diabetes Mellitus II Mother   . Diabetes Mellitus II Father     Social History Social History   Tobacco Use  . Smoking status: Current Every Day Smoker    Packs/day: 0.10    Years: 40.00    Pack years: 4.00    Types: Cigarettes  . Smokeless tobacco: Never Used  Substance Use Topics  . Alcohol use: Yes    Alcohol/week: 1.2 oz    Types: 2 Cans of beer per week  . Drug use: No    Comment: 12/23/2014  "stopped ~ 10 yrs ago"     Allergies   Ibuprofen and Tylenol [acetaminophen]   Review of Systems Review of Systems  Constitutional: Negative for fever.  Gastrointestinal: Negative for abdominal pain and vomiting.  All other systems reviewed and are negative.    Physical Exam Updated Vital Signs BP (!) 188/87 (BP Location: Left Arm)   Pulse 68   Temp 97.9 F (36.6 C) (Oral)   Resp 15   SpO2 97%   Physical Exam  Constitutional: He is oriented to person, place, and time. He appears well-nourished.  57 year old unkempt gentleman.  HENT:  Head: Normocephalic.  Eyes: Conjunctivae are normal.  Cardiovascular: Normal rate.  Pulmonary/Chest: Effort normal and breath sounds normal.  Abdominal: Soft.  Neurological: He is oriented to person, place, and time.  Skin: Skin is warm and dry. He is not diaphoretic.  Psychiatric: He has a normal mood and affect. His behavior is normal.     ED Treatments / Results  Labs (all labs ordered are listed, but only abnormal results are displayed) Labs Reviewed  CBG MONITORING, ED - Abnormal; Notable for the following components:      Result Value   Glucose-Capillary >600 (*)    All other components within normal limits  I-STAT CHEM 8, ED    EKG None  Radiology No results found.  Procedures Procedures (including critical care time)  Medications Ordered in ED Medications - No data to display   Initial Impression / Assessment and Plan / ED Course  I have reviewed the triage vital signs and the nursing notes.  Pertinent labs & imaging results that were available during my care of the patient were reviewed by me and considered in my medical decision making (see chart for details).    57 year old male presenting to the emergency room today with elevated blood sugars.  Patient has had 62 visits in the last 6 months.  Patient had daily visits for the last 2-1/2 weeks.  Patient's blood sugar per EMS was 324, on ours it was above  600.  Will do Chem-8.  Instructions on patient's care plan is to check a Chem-8, subcu insulin and discharge home.  Patient appears baseline.  Normal vital signs.  2:22 PM Patient's anion gap is 11.  This means that patient is not DKA.  Will give him a single dose of IM insulin and then have him  continue to manage his diabetes at home.  Final Clinical Impressions(s) / ED Diagnoses   Final diagnoses:  None    ED Discharge Orders    None       Jelani Vreeland, Fredia Sorrow, MD 04/28/17 1425

## 2017-04-28 NOTE — ED Triage Notes (Signed)
Per [facility] pt here for hyperglycemia was just discharged for same complaint.

## 2017-04-28 NOTE — ED Triage Notes (Signed)
Per EMS pt here for hyperglycemia. CBG 324 en route.

## 2017-04-28 NOTE — Discharge Instructions (Addendum)
Take insulin

## 2017-04-29 ENCOUNTER — Encounter (HOSPITAL_COMMUNITY): Payer: Self-pay

## 2017-04-29 ENCOUNTER — Emergency Department (HOSPITAL_COMMUNITY)
Admission: EM | Admit: 2017-04-29 | Discharge: 2017-04-29 | Disposition: A | Discharge status: Home / Self Care | Payer: Self-pay | Attending: Emergency Medicine | Admitting: Emergency Medicine

## 2017-04-29 DIAGNOSIS — E1165 Type 2 diabetes mellitus with hyperglycemia: Secondary | ICD-10-CM | POA: Insufficient documentation

## 2017-04-29 DIAGNOSIS — R739 Hyperglycemia, unspecified: Secondary | ICD-10-CM

## 2017-04-29 DIAGNOSIS — F17210 Nicotine dependence, cigarettes, uncomplicated: Secondary | ICD-10-CM | POA: Insufficient documentation

## 2017-04-29 DIAGNOSIS — Z79899 Other long term (current) drug therapy: Secondary | ICD-10-CM | POA: Insufficient documentation

## 2017-04-29 DIAGNOSIS — Z794 Long term (current) use of insulin: Secondary | ICD-10-CM | POA: Insufficient documentation

## 2017-04-29 DIAGNOSIS — I10 Essential (primary) hypertension: Secondary | ICD-10-CM | POA: Insufficient documentation

## 2017-04-29 DIAGNOSIS — Z7982 Long term (current) use of aspirin: Secondary | ICD-10-CM | POA: Insufficient documentation

## 2017-04-29 DIAGNOSIS — E785 Hyperlipidemia, unspecified: Secondary | ICD-10-CM | POA: Insufficient documentation

## 2017-04-29 LAB — I-STAT CHEM 8, ED
BUN: 28 mg/dL — AB (ref 6–20)
Calcium, Ion: 1.21 mmol/L (ref 1.15–1.40)
Chloride: 94 mmol/L — ABNORMAL LOW (ref 101–111)
Creatinine, Ser: 1 mg/dL (ref 0.61–1.24)
Glucose, Bld: >700 mg/dL (ref 65–99)
HEMATOCRIT: 40 % (ref 39.0–52.0)
HEMOGLOBIN: 13.6 g/dL (ref 13.0–17.0)
Potassium: 5.3 mmol/L — ABNORMAL HIGH (ref 3.5–5.1)
SODIUM: 131 mmol/L — AB (ref 135–145)
TCO2: 28 mmol/L (ref 22–32)

## 2017-04-29 MED ORDER — INSULIN ASPART PROT & ASPART (70-30 MIX) 100 UNIT/ML ~~LOC~~ SUSP
10.0000 [IU] | Freq: Once | SUBCUTANEOUS | Status: AC
  Administered 2017-04-29: 10 [IU] via SUBCUTANEOUS
  Filled 2017-04-29: qty 10

## 2017-04-29 NOTE — ED Provider Notes (Signed)
Received patient at signout from PA [place].  Refer to provider note for full history and physical examination.  Briefly, patient presents for his usual complaint of hyperglycemia.  He is well known to us in the ED.  For example, he has been here 12 times in the past 10 days for the same complaint.  Physical examination reassuring.  Awaiting i-STAT Chem-8 to rule out DVT.  He is somewhat more hypertensive on initial evaluation than his baseline blood pressure.  We will reevaluate.  No signs of hypertensive urgency or emergency.  10:27 PM Calculated anion gap 9.  Will give subcutaneous insulin per care plan.  Blood pressure has improved.  Counseled patient on medication compliance.  Safe for discharge at this time.      Jeanie SewerFawze, Brexlee Heberlein A, PA-C 04/29/17 2228    Shaune PollackIsaacs, [place], MD 04/30/17 1850

## 2017-04-29 NOTE — ED Notes (Signed)
Unable to obtain an accurate blood pressure without pt moving his arm.

## 2017-04-29 NOTE — ED Provider Notes (Signed)
[place] DEPT Provider Note   CSN: [id number] Arrival date & time: 04/29/17  2043     History   Chief Complaint Chief Complaint  Patient presents with  . Hyperglycemia    HPI Mike [name] is a 57 y.o. male presenting for evaluation of hyperglycemia.   Pt with h/o DM, frequently seen in the ER for hyperglycemia (63 visits in 6 mo). States he is here today because his bgl is high. States he checked it earlier, but does not remember number. States he took his insulin this am around 9 am. reports he was feeling nauseated this morning with one episode of vomiting at 9 am. Denies fevers, chills, cp, sob, currently nausea/vomtiing, abd pain, urinary sxs, or abnormal BMs.      HPI  Past Medical History:  Diagnosis Date  . Anxiety   . Anxiety   . Chronic lower back pain   . Depression   . DKA (diabetic ketoacidoses) ([place]) 07/30/2016  . Hyperlipemia   . Hypertension   . Migraine    "last one was ~ 4 yr ago" (12/23/2014)  . Seizures ([place])    "related to pills for anxiety; if I don't take the pills I'm suppose to take I'll have them" (12/23/2014)  . Type II diabetes mellitus [facility])     Patient Active Problem List   Diagnosis Date Noted  . AKI (acute kidney injury) ([place]) 03/04/2017  . Malnutrition of moderate degree 02/08/2017  . DKA (diabetic ketoacidoses) ([place]) 02/07/2017  . History of stroke 02/07/2017  . Hyperphosphatemia 12/13/2016  . Evaluation by psychiatric service required 11/30/2016  . Lactic acidosis 11/18/2016  . Normocytic anemia 11/06/2016  . HLD (hyperlipidemia) 11/06/2016  . Adjustment disorder with other symptoms 11/05/2016  . Uncontrolled type 2 diabetes mellitus with hyperosmolar nonketotic hyperglycemia ([place]) 08/24/2016  . Essential hypertension 08/24/2016  . Acute ischemic stroke ([place])   . Protein-calorie malnutrition, severe 12/24/2014  . Homelessness 12/23/2014  . Hypertensive urgency 12/23/2014  . DM (diabetes  mellitus) ([place]) 12/23/2014  . Intermittent palpitations 12/23/2014  . Tobacco abuse 12/23/2014  . Pleuritic chest pain 12/23/2014  . Abnormal EKG 12/23/2014  . Type II diabetes mellitus with renal manifestations [facility])     Past Surgical History:  Procedure Laterality Date  . NO PAST SURGERIES          Home Medications    Prior to Admission medications   Medication Sig Start Date End Date Taking? Authorizing Provider  aspirin 325 MG tablet Take 1 tablet (325 mg total) by mouth daily. 03/08/17   Georgette Shell, MD  atorvastatin (LIPITOR) 40 MG tablet Take 1 tablet (40 mg total) by mouth daily. 01/26/17   Cardama, Grayce Sessions, MD  blood glucose meter kit and supplies KIT Dispense based on patient and insurance preference. Use up to four times daily as directed. (FOR ICD-9 250.00, 250.01). 11/22/16   Velvet Bathe, MD  ferrous sulfate 325 (65 FE) MG tablet Take 1 tablet (325 mg total) daily by mouth. Patient taking differently: Take 325 mg by mouth daily as needed (low iron).  11/23/16   Ward, Delice Bison, DO  insulin aspart protamine- aspart (NOVOLOG MIX 70/30) (70-30) 100 UNIT/ML injection Inject 0.3 mLs (30 Units total) into the skin 2 (two) times daily with a meal. 04/21/17   Ward, Delice Bison, DO  lisinopril (PRINIVIL,ZESTRIL) 40 MG tablet Take 1 tablet (40 mg total) by mouth daily. 01/26/17   Fatima Blank, MD  metoprolol tartrate (LOPRESSOR) 25 MG  tablet Take 0.5 tablets (12.5 mg total) by mouth 2 (two) times daily. 03/07/17   Georgette Shell, MD    Family History Family History  Problem Relation Age of Onset  . Diabetes Mellitus II Mother   . Diabetes Mellitus II Father     Social History Social History   Tobacco Use  . Smoking status: Current Every Day Smoker    Packs/day: 0.10    Years: 40.00    Pack years: 4.00    Types: Cigarettes  . Smokeless tobacco: Never Used  Substance Use Topics  . Alcohol use: Yes    Alcohol/week: 1.2 oz    Types: 2 Cans of  beer per week  . Drug use: No    Comment: 12/23/2014 "stopped ~ 10 yrs ago"     Allergies   Ibuprofen and Tylenol [acetaminophen]   Review of Systems Review of Systems  Gastrointestinal: Positive for nausea (resolved) and vomiting (one episode, resolved).     Physical Exam Updated Vital Signs BP (!) 204/94 (BP Location: Left Arm)   Pulse 91   Resp 14   SpO2 98%   Physical Exam  Constitutional: He is oriented to person, place, and time. He appears well-developed and well-nourished. No distress.  Sitting comfortably in NAD  HENT:  Head: Normocephalic and atraumatic.  Eyes: EOM are normal.  Neck: Normal range of motion.  Cardiovascular: Normal rate, regular rhythm and intact distal pulses.  Pulmonary/Chest: Effort normal and breath sounds normal. No respiratory distress. He has no wheezes.  Abdominal: Soft. He exhibits no distension. There is no tenderness. There is no guarding.  No TTP of the abd. Soft without rigidity, guarding, or distention  Musculoskeletal: Normal range of motion.  Neurological: He is alert and oriented to person, place, and time.  Skin: Skin is warm. No rash noted.  Psychiatric: His affect is blunt. His speech is delayed. He is withdrawn. He is noncommunicative.  Flat affect. Delayed speech, baseline per chart review  Nursing note and vitals reviewed.    ED Treatments / Results  Labs (all labs ordered are listed, but only abnormal results are displayed) Labs Reviewed  I-STAT CHEM 8, ED    EKG None  Radiology No results found.  Procedures Procedures (including critical care time)  Medications Ordered in ED Medications - No data to display   Initial Impression / Assessment and Plan / ED Course  I have reviewed the triage vital signs and the nursing notes.  Pertinent labs & imaging results that were available during my care of the patient were reviewed by me and considered in my medical decision making (see chart for details).      Pt presenting for evaluation of BGL. Physical exam shows pt is nontoxic. BP elevated, I question if he is taking BP medications due to h/o noncompliance. CBG elevated with EMS. Per care  Plan guideline, will obtain chem 8 to r/o DKA or other concerning abnormality.   Pt signed out to M. Fawze, PA-C for follow up on chem 8 and blood pressure. Plan for d/c if pt appears at baseline.   Final Clinical Impressions(s) / ED Diagnoses   Final diagnoses:  None    ED Discharge Orders    None       Franchot Heidelberg, PA-C 04/29/17 2200    Lacretia Leigh, MD 04/29/17 2312

## 2017-04-29 NOTE — ED Triage Notes (Signed)
[facility]  EMS VS: 194/84 98 HR CGB 546 500 mL NS 20 ga L hand

## 2017-04-30 ENCOUNTER — Emergency Department (HOSPITAL_COMMUNITY)
Admission: EM | Admit: 2017-04-30 | Discharge: 2017-04-30 | Disposition: A | Discharge status: Home / Self Care | Payer: Self-pay | Attending: Emergency Medicine | Admitting: Emergency Medicine

## 2017-04-30 ENCOUNTER — Encounter (HOSPITAL_COMMUNITY): Payer: Self-pay | Admitting: Emergency Medicine

## 2017-04-30 DIAGNOSIS — Z9119 Patient's noncompliance with other medical treatment and regimen: Secondary | ICD-10-CM | POA: Insufficient documentation

## 2017-04-30 DIAGNOSIS — F17210 Nicotine dependence, cigarettes, uncomplicated: Secondary | ICD-10-CM | POA: Insufficient documentation

## 2017-04-30 DIAGNOSIS — Z590 Homelessness: Secondary | ICD-10-CM | POA: Insufficient documentation

## 2017-04-30 DIAGNOSIS — Z79899 Other long term (current) drug therapy: Secondary | ICD-10-CM | POA: Insufficient documentation

## 2017-04-30 DIAGNOSIS — E1165 Type 2 diabetes mellitus with hyperglycemia: Secondary | ICD-10-CM | POA: Insufficient documentation

## 2017-04-30 DIAGNOSIS — R739 Hyperglycemia, unspecified: Secondary | ICD-10-CM

## 2017-04-30 DIAGNOSIS — Z9114 Patient's other noncompliance with medication regimen: Secondary | ICD-10-CM | POA: Insufficient documentation

## 2017-04-30 DIAGNOSIS — I10 Essential (primary) hypertension: Secondary | ICD-10-CM | POA: Insufficient documentation

## 2017-04-30 DIAGNOSIS — Z7982 Long term (current) use of aspirin: Secondary | ICD-10-CM | POA: Insufficient documentation

## 2017-04-30 DIAGNOSIS — Z794 Long term (current) use of insulin: Secondary | ICD-10-CM | POA: Insufficient documentation

## 2017-04-30 DIAGNOSIS — Z91199 Patient's noncompliance with other medical treatment and regimen due to unspecified reason: Secondary | ICD-10-CM

## 2017-04-30 LAB — CBG MONITORING, ED
Glucose-Capillary: 418 mg/dL — ABNORMAL HIGH (ref 65–99)
Glucose-Capillary: 490 mg/dL — ABNORMAL HIGH (ref 65–99)

## 2017-04-30 LAB — I-STAT CHEM 8, ED
BUN: 24 mg/dL — AB (ref 6–20)
CALCIUM ION: 1.07 mmol/L — AB (ref 1.15–1.40)
CREATININE: 0.6 mg/dL — AB (ref 0.61–1.24)
Chloride: 103 mmol/L (ref 101–111)
GLUCOSE: 437 mg/dL — AB (ref 65–99)
HEMATOCRIT: 36 % — AB (ref 39.0–52.0)
HEMOGLOBIN: 12.2 g/dL — AB (ref 13.0–17.0)
Potassium: 5.2 mmol/L — ABNORMAL HIGH (ref 3.5–5.1)
Sodium: 136 mmol/L (ref 135–145)
TCO2: 29 mmol/L (ref 22–32)

## 2017-04-30 MED ORDER — METOPROLOL TARTRATE 25 MG PO TABS
12.5000 mg | ORAL_TABLET | Freq: Two times a day (BID) | ORAL | Status: DC
  Administered 2017-04-30: 12.5 mg via ORAL
  Filled 2017-04-30: qty 1

## 2017-04-30 MED ORDER — LISINOPRIL 20 MG PO TABS
40.0000 mg | ORAL_TABLET | Freq: Every day | ORAL | Status: DC
  Administered 2017-04-30: 40 mg via ORAL
  Filled 2017-04-30: qty 2

## 2017-04-30 MED ORDER — INSULIN ASPART PROT & ASPART (70-30 MIX) 100 UNIT/ML ~~LOC~~ SUSP
30.0000 [IU] | Freq: Two times a day (BID) | SUBCUTANEOUS | Status: DC
  Administered 2017-04-30: 30 [IU] via SUBCUTANEOUS
  Filled 2017-04-30: qty 10

## 2017-04-30 MED ORDER — LISINOPRIL 40 MG PO TABS: 40.0000 mg | ORAL_TABLET | Freq: Every day | ORAL | 0 refills | Status: DC

## 2017-04-30 MED ORDER — INSULIN ASPART PROT & ASPART (70-30 MIX) 100 UNIT/ML ~~LOC~~ SUSP: 30.0000 [IU] | Freq: Two times a day (BID) | SUBCUTANEOUS | 11 refills | Status: DC

## 2017-04-30 MED ORDER — INSULIN ASPART PROT & ASPART (70-30 MIX) 100 UNIT/ML ~~LOC~~ SUSP: 30.0000 [IU] | Freq: Two times a day (BID) | SUBCUTANEOUS | Status: DC

## 2017-04-30 NOTE — ED Notes (Signed)
Pt from home with c/o hyperglycemia via ems. Pt is noncompliant with home meds. Has had multiple visits for same

## 2017-04-30 NOTE — ED Triage Notes (Signed)
Per [facility]-patient has been run twice this am-states CBG 521-states he wasn't answering questions when they were called out the second time so they brought him to ED-patient states he hasnt been able to get his meds filled

## 2017-04-30 NOTE — ED Notes (Signed)
Bed: WTR6 Expected date:  Expected time:  Means of arrival:  Comments: 

## 2017-04-30 NOTE — Discharge Instructions (Signed)
Please follow up with your primary care doctor, continue your medications

## 2017-04-30 NOTE — ED Provider Notes (Signed)
[place] DEPT Provider Note   CSN: [id number] Arrival date & time: 04/30/17  9179     History   Chief Complaint Chief Complaint  Patient presents with  . Hyperglycemia    HPI Mike [name] is a 57 y.o. male.  HPI Patient presents to the emergency room for evaluation of recurrent hyperglycemia.  Patient has a history of diabetes, homelessness, medical noncompliance and frequent visits to the emergency room.  The patient has been seen several times just in this past week including twice on the 13th and once on the 14th.  Pt states he is here today because his blood sugar is high.  He denies complaint other than feeling hungry.   No abdominal pain.  No vomiting or diarrhea.  No fever. Past Medical History:  Diagnosis Date  . Anxiety   . Anxiety   . Chronic lower back pain   . Depression   . DKA (diabetic ketoacidoses) ([place]) 07/30/2016  . Hyperlipemia   . Hypertension   . Migraine    "last one was ~ 4 yr ago" (12/23/2014)  . Seizures ([place])    "related to pills for anxiety; if I don't take the pills I'm suppose to take I'll have them" (12/23/2014)  . Type II diabetes mellitus [facility])     Patient Active Problem List   Diagnosis Date Noted  . AKI (acute kidney injury) ([place]) 03/04/2017  . Malnutrition of moderate degree 02/08/2017  . DKA (diabetic ketoacidoses) ([place]) 02/07/2017  . History of stroke 02/07/2017  . Hyperphosphatemia 12/13/2016  . Evaluation by psychiatric service required 11/30/2016  . Lactic acidosis 11/18/2016  . Normocytic anemia 11/06/2016  . HLD (hyperlipidemia) 11/06/2016  . Adjustment disorder with other symptoms 11/05/2016  . Uncontrolled type 2 diabetes mellitus with hyperosmolar nonketotic hyperglycemia ([place]) 08/24/2016  . Essential hypertension 08/24/2016  . Acute ischemic stroke ([place])   . Protein-calorie malnutrition, severe 12/24/2014  . Homelessness 12/23/2014  . Hypertensive urgency 12/23/2014  . DM (diabetes  mellitus) ([place]) 12/23/2014  . Intermittent palpitations 12/23/2014  . Tobacco abuse 12/23/2014  . Pleuritic chest pain 12/23/2014  . Abnormal EKG 12/23/2014  . Type II diabetes mellitus with renal manifestations [facility])     Past Surgical History:  Procedure Laterality Date  . NO PAST SURGERIES          Home Medications    Prior to Admission medications   Medication Sig Start Date End Date Taking? Authorizing Provider  aspirin 325 MG tablet Take 1 tablet (325 mg total) by mouth daily. 03/08/17   Georgette Shell, MD  atorvastatin (LIPITOR) 40 MG tablet Take 1 tablet (40 mg total) by mouth daily. 01/26/17   Cardama, Grayce Sessions, MD  blood glucose meter kit and supplies KIT Dispense based on patient and insurance preference. Use up to four times daily as directed. (FOR ICD-9 250.00, 250.01). 11/22/16   Velvet Bathe, MD  ferrous sulfate 325 (65 FE) MG tablet Take 1 tablet (325 mg total) daily by mouth. Patient taking differently: Take 325 mg by mouth daily as needed (low iron).  11/23/16   Ward, Delice Bison, DO  insulin aspart protamine- aspart (NOVOLOG MIX 70/30) (70-30) 100 UNIT/ML injection Inject 0.3 mLs (30 Units total) into the skin 2 (two) times daily with a meal. 04/21/17   Ward, Delice Bison, DO  lisinopril (PRINIVIL,ZESTRIL) 40 MG tablet Take 1 tablet (40 mg total) by mouth daily. 01/26/17   Fatima Blank, MD  metoprolol tartrate (LOPRESSOR) 25 MG tablet Take  0.5 tablets (12.5 mg total) by mouth 2 (two) times daily. 03/07/17   Georgette Shell, MD    Family History Family History  Problem Relation Age of Onset  . Diabetes Mellitus II Mother   . Diabetes Mellitus II Father     Social History Social History   Tobacco Use  . Smoking status: Current Every Day Smoker    Packs/day: 0.10    Years: 40.00    Pack years: 4.00    Types: Cigarettes  . Smokeless tobacco: Never Used  Substance Use Topics  . Alcohol use: Yes    Alcohol/week: 1.2 oz    Types: 2 Cans of  beer per week  . Drug use: No    Comment: 12/23/2014 "stopped ~ 10 yrs ago"     Allergies   Ibuprofen and Tylenol [acetaminophen]   Review of Systems Review of Systems  All other systems reviewed and are negative.    Physical Exam Updated Vital Signs BP (!) 173/94 (BP Location: Right Arm)   Pulse 68   Temp (!) 97.5 F (36.4 C) (Oral)   Resp 18   SpO2 100%   Physical Exam  Constitutional: No distress.  disheveled  HENT:  Head: Normocephalic and atraumatic.  Right Ear: External ear normal.  Left Ear: External ear normal.  Eyes: Conjunctivae are normal. Right eye exhibits no discharge. Left eye exhibits no discharge. No scleral icterus.  Neck: Neck supple. No tracheal deviation present.  Cardiovascular: Normal rate, regular rhythm and intact distal pulses.  Pulmonary/Chest: Effort normal and breath sounds normal. No stridor. No respiratory distress. He has no wheezes. He has no rales.  Abdominal: Soft. Bowel sounds are normal. He exhibits no distension. There is no tenderness. There is no rebound and no guarding.  Musculoskeletal: He exhibits no edema or tenderness.  Neurological: He is alert. He has normal strength. No cranial nerve deficit (no facial droop, extraocular movements intact, no slurred speech) or sensory deficit. He exhibits normal muscle tone. He displays no seizure activity. Coordination normal.  Skin: Skin is warm and dry. No rash noted. He is not diaphoretic.  Psychiatric: He has a normal mood and affect.  Nursing note and vitals reviewed.    ED Treatments / Results  Labs (all labs ordered are listed, but only abnormal results are displayed) Labs Reviewed  I-STAT CHEM 8, ED - Abnormal; Notable for the following components:      Result Value   Potassium 5.2 (*)    BUN 24 (*)    Creatinine, Ser 0.60 (*)    Glucose, Bld 437 (*)    Calcium, Ion 1.07 (*)    Hemoglobin 12.2 (*)    HCT 36.0 (*)    All other components within normal limits  CBG  MONITORING, ED - Abnormal; Notable for the following components:   Glucose-Capillary 418 (*)    All other components within normal limits    EKG None  Radiology No results found.  Procedures Procedures (including critical care time)  Medications Ordered in ED Medications  metoprolol tartrate (LOPRESSOR) tablet 12.5 mg (has no administration in time range)  lisinopril (PRINIVIL,ZESTRIL) tablet 40 mg (has no administration in time range)  insulin aspart protamine- aspart (NOVOLOG MIX 70/30) injection 30 Units (has no administration in time range)     Initial Impression / Assessment and Plan / ED Course  I have reviewed the triage vital signs and the nursing notes.  Pertinent labs & imaging results that were available during my care of  the patient were reviewed by me and considered in my medical decision making (see chart for details).  Clinical Course as of Apr 30 1125  Mon Apr 30, 2017  1123 Pt was given 10 unites of 70/30 at prior ED visit   [JK]  1124 Lab tests reviewed.  Hyperglycemia without signs of DKA.  Anion gap is 9.  Will give him a dose of his insulin.  Also noted to be hypertensive.  BP meds ordered   [JK]    Clinical Course User Index [JK] Dorie Rank, MD    No signs of dka. Pt has chronic htn and diabetes.  Unfortunately this is a typical presentation of his hyperglycemia.  Doses of his medications given in the ED.  No signs of emergent medical conditions requiring hospitalization or further emergent treatment..  Stable for discharge.  Final Clinical Impressions(s) / ED Diagnoses   Final diagnoses:  Hyperglycemia  Hypertension, unspecified type  Medically noncompliant    ED Discharge Orders    None       Dorie Rank, MD 04/30/17 1134

## 2017-05-01 ENCOUNTER — Encounter (HOSPITAL_COMMUNITY): Payer: Self-pay

## 2017-05-01 ENCOUNTER — Emergency Department (HOSPITAL_COMMUNITY)
Admission: EM | Admit: 2017-05-01 | Discharge: 2017-05-01 | Disposition: A | Discharge status: Home / Self Care | Payer: Self-pay | Attending: Emergency Medicine | Admitting: Emergency Medicine

## 2017-05-01 ENCOUNTER — Emergency Department (HOSPITAL_COMMUNITY)
Admission: EM | Admit: 2017-05-01 | Discharge: 2017-05-02 | Disposition: A | Discharge status: Home / Self Care | Payer: Self-pay | Attending: Emergency Medicine | Admitting: Emergency Medicine

## 2017-05-01 DIAGNOSIS — E1010 Type 1 diabetes mellitus with ketoacidosis without coma: Secondary | ICD-10-CM | POA: Insufficient documentation

## 2017-05-01 DIAGNOSIS — R112 Nausea with vomiting, unspecified: Secondary | ICD-10-CM | POA: Insufficient documentation

## 2017-05-01 DIAGNOSIS — Z794 Long term (current) use of insulin: Secondary | ICD-10-CM | POA: Insufficient documentation

## 2017-05-01 DIAGNOSIS — F17210 Nicotine dependence, cigarettes, uncomplicated: Secondary | ICD-10-CM | POA: Insufficient documentation

## 2017-05-01 DIAGNOSIS — Z91199 Patient's noncompliance with other medical treatment and regimen due to unspecified reason: Secondary | ICD-10-CM

## 2017-05-01 DIAGNOSIS — I10 Essential (primary) hypertension: Secondary | ICD-10-CM | POA: Insufficient documentation

## 2017-05-01 DIAGNOSIS — Z9119 Patient's noncompliance with other medical treatment and regimen: Secondary | ICD-10-CM

## 2017-05-01 DIAGNOSIS — R739 Hyperglycemia, unspecified: Secondary | ICD-10-CM

## 2017-05-01 LAB — I-STAT CHEM 8, ED
BUN: 20 mg/dL (ref 6–20)
BUN: 21 mg/dL — AB (ref 6–20)
CREATININE: 0.7 mg/dL (ref 0.61–1.24)
CREATININE: 1 mg/dL (ref 0.61–1.24)
Calcium, Ion: 1.21 mmol/L (ref 1.15–1.40)
Calcium, Ion: 1.21 mmol/L (ref 1.15–1.40)
Chloride: 98 mmol/L — ABNORMAL LOW (ref 101–111)
Chloride: 97 mmol/L — ABNORMAL LOW (ref 101–111)
Glucose, Bld: 368 mg/dL — ABNORMAL HIGH (ref 65–99)
HCT: 35 % — ABNORMAL LOW (ref 39.0–52.0)
HEMATOCRIT: 34 % — AB (ref 39.0–52.0)
Hemoglobin: 11.6 g/dL — ABNORMAL LOW (ref 13.0–17.0)
Hemoglobin: 11.9 g/dL — ABNORMAL LOW (ref 13.0–17.0)
POTASSIUM: 4.1 mmol/L (ref 3.5–5.1)
POTASSIUM: 4.4 mmol/L (ref 3.5–5.1)
Sodium: 136 mmol/L (ref 135–145)
Sodium: 134 mmol/L — ABNORMAL LOW (ref 135–145)
TCO2: 30 mmol/L (ref 22–32)
TCO2: 21 mmol/L — ABNORMAL LOW (ref 22–32)

## 2017-05-01 MED ORDER — POTASSIUM CHLORIDE 10 MEQ/100ML IV SOLN
10.0000 meq | INTRAVENOUS | Status: AC
  Administered 2017-05-02: 10 meq via INTRAVENOUS
  Filled 2017-05-01: qty 100

## 2017-05-01 MED ORDER — SODIUM CHLORIDE 0.9 % IV BOLUS
2000.0000 mL | Freq: Once | INTRAVENOUS | Status: AC
  Administered 2017-05-01: 2000 mL via INTRAVENOUS

## 2017-05-01 MED ORDER — POTASSIUM CHLORIDE 10 MEQ/100ML IV SOLN
10.0000 meq | INTRAVENOUS | Status: AC
  Administered 2017-05-01: 10 meq via INTRAVENOUS
  Filled 2017-05-01: qty 100

## 2017-05-01 MED ORDER — SODIUM CHLORIDE 0.9 % IV SOLN
INTRAVENOUS | Status: DC
  Administered 2017-05-01: 5.4 [IU]/h via INTRAVENOUS
  Filled 2017-05-01: qty 1

## 2017-05-01 MED ORDER — DEXTROSE-NACL 5-0.45 % IV SOLN
INTRAVENOUS | Status: DC
  Administered 2017-05-01: 23:00:00 via INTRAVENOUS

## 2017-05-01 MED ORDER — DEXTROSE-NACL 5-0.45 % IV SOLN: INTRAVENOUS | Status: DC
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
## 2017-05-01 NOTE — ED Notes (Signed)
534 CBG and 110 pulse per EMS

## 2017-05-01 NOTE — ED Notes (Signed)
Pt refused dc vitals. Pt escorted to lobby by security

## 2017-05-01 NOTE — ED Notes (Signed)
Bed: [facility]PT4 Expected date:  Expected time:  Means of arrival:  Comments: 

## 2017-05-01 NOTE — ED Notes (Signed)
EDP at bedside  

## 2017-05-01 NOTE — ED Provider Notes (Signed)
[place] DEPT Provider Note   CSN: [id number] Arrival date & time: 04/30/17  2106     History   Chief Complaint No chief complaint on file.   HPI Mike [name] is a 57 y.o. male.   The history is provided by the patient.   Patient presents for repeat visit for hyperglycemia.  He reports that his friend was concerned about him and called EMS.  He reports that his glucose is high.  He reports that he is compliant with medications.  He reports he saw his PCP yesterday and she told him his blood sugar was high.  He denies any complaints at this time he denies fever/vomiting/chest pain/abdominal pain.  He denies any complaints at this time. Past Medical History:  Diagnosis Date  . Anxiety   . Anxiety   . Chronic lower back pain   . Depression   . DKA (diabetic ketoacidoses) ([place]) 07/30/2016  . Hyperlipemia   . Hypertension   . Migraine    "last one was ~ 4 yr ago" (12/23/2014)  . Seizures ([place])    "related to pills for anxiety; if I don't take the pills I'm suppose to take I'll have them" (12/23/2014)  . Type II diabetes mellitus [facility])     Patient Active Problem List   Diagnosis Date Noted  . AKI (acute kidney injury) ([place]) 03/04/2017  . Malnutrition of moderate degree 02/08/2017  . DKA (diabetic ketoacidoses) ([place]) 02/07/2017  . History of stroke 02/07/2017  . Hyperphosphatemia 12/13/2016  . Evaluation by psychiatric service required 11/30/2016  . Lactic acidosis 11/18/2016  . Normocytic anemia 11/06/2016  . HLD (hyperlipidemia) 11/06/2016  . Adjustment disorder with other symptoms 11/05/2016  . Uncontrolled type 2 diabetes mellitus with hyperosmolar nonketotic hyperglycemia ([place]) 08/24/2016  . Essential hypertension 08/24/2016  . Acute ischemic stroke ([place])   . Protein-calorie malnutrition, severe 12/24/2014  . Homelessness 12/23/2014  . Hypertensive urgency 12/23/2014  . DM (diabetes mellitus) ([place]) 12/23/2014  . Intermittent  palpitations 12/23/2014  . Tobacco abuse 12/23/2014  . Pleuritic chest pain 12/23/2014  . Abnormal EKG 12/23/2014  . Type II diabetes mellitus with renal manifestations [facility])     Past Surgical History:  Procedure Laterality Date  . NO PAST SURGERIES          Home Medications    Prior to Admission medications   Medication Sig Start Date End Date Taking? Authorizing Provider  aspirin 325 MG tablet Take 1 tablet (325 mg total) by mouth daily. 03/08/17  Yes Georgette Shell, MD  atorvastatin (LIPITOR) 40 MG tablet Take 1 tablet (40 mg total) by mouth daily. 01/26/17  Yes Cardama, Grayce Sessions, MD  ferrous sulfate 325 (65 FE) MG tablet Take 1 tablet (325 mg total) daily by mouth. Patient taking differently: Take 325 mg by mouth daily as needed (low iron).  11/23/16  Yes Ward, Cyril Mourning N, DO  insulin aspart protamine- aspart (NOVOLOG MIX 70/30) (70-30) 100 UNIT/ML injection Inject 0.3 mLs (30 Units total) into the skin 2 (two) times daily with a meal. 04/30/17  Yes Dorie Rank, MD  lisinopril (PRINIVIL,ZESTRIL) 40 MG tablet Take 1 tablet (40 mg total) by mouth daily. 04/30/17  Yes Dorie Rank, MD  metoprolol tartrate (LOPRESSOR) 25 MG tablet Take 0.5 tablets (12.5 mg total) by mouth 2 (two) times daily. 03/07/17  Yes Georgette Shell, MD  blood glucose meter kit and supplies KIT Dispense based on patient and insurance preference. Use up to four times daily as directed. (  FOR ICD-9 250.00, 250.01). 11/22/16   Velvet Bathe, MD    Family History Family History  Problem Relation Age of Onset  . Diabetes Mellitus II Mother   . Diabetes Mellitus II Father     Social History Social History   Tobacco Use  . Smoking status: Current Every Day Smoker    Packs/day: 0.10    Years: 40.00    Pack years: 4.00    Types: Cigarettes  . Smokeless tobacco: Never Used  Substance Use Topics  . Alcohol use: Yes    Alcohol/week: 1.2 oz    Types: 2 Cans of beer per week  . Drug use: No    Comment:  12/23/2014 "stopped ~ 10 yrs ago"     Allergies   Ibuprofen and Tylenol [acetaminophen]   Review of Systems Review of Systems  Constitutional: Negative for fever.  Gastrointestinal: Negative for vomiting.     Physical Exam Updated Vital Signs BP (!) 167/98 (BP Location: Right Arm)   Pulse 86   Temp 98 F (36.7 C) (Oral)   Resp 15   SpO2 100%   Physical Exam  CONSTITUTIONAL: Disheveled HEAD: Normocephalic EYES: EOMI ENMT: Mucous membranes moist NECK: supple no meningeal signs LUNGS:  no apparent distress NEURO: Pt is awake/alert/appropriate, moves all extremitiesx4.  No facial droop.   EXTREMITIES:full ROM SKIN: warm, color normal PSYCH: no abnormalities of mood noted, alert and oriented to situation  ED Treatments / Results  Labs (all labs ordered are listed, but only abnormal results are displayed) Labs Reviewed  CBG MONITORING, ED - Abnormal; Notable for the following components:      Result Value   Glucose-Capillary 490 (*)    All other components within normal limits  I-STAT CHEM 8, ED - Abnormal; Notable for the following components:   Chloride 98 (*)    Glucose, Bld 368 (*)    Hemoglobin 11.6 (*)    HCT 34.0 (*)    All other components within normal limits    EKG None  Radiology No results found.  Procedures Procedures (including critical care time)  Medications Ordered in ED Medications - No data to display   Initial Impression / Assessment and Plan / ED Course  I have reviewed the triage vital signs and the nursing notes.  Pertinent labs results that were available during my care of the patient were reviewed by me and considered in my medical decision making (see chart for details).     Patient stable this time.  No signs of DKA.  Advised PCP follow-up and medication compliance  Final Clinical Impressions(s) / ED Diagnoses   Final diagnoses:  H/O noncompliance with medical treatment, presenting hazards to health  Hyperglycemia     ED Discharge Orders    None      Ripley Fraise, MD 05/01/17 [phone]

## 2017-05-01 NOTE — ED Notes (Signed)
Bed: WTR8 Expected date:  Expected time:  Means of arrival:  Comments: 

## 2017-05-01 NOTE — ED Notes (Signed)
Bed: WLPT3 Expected date:  Expected time:  Means of arrival:  Comments: 

## 2017-05-02 LAB — I-STAT CHEM 8, ED
BUN: 18 mg/dL (ref 6–20)
CALCIUM ION: 1.27 mmol/L (ref 1.15–1.40)
CHLORIDE: 107 mmol/L (ref 101–111)
Creatinine, Ser: 0.7 mg/dL (ref 0.61–1.24)
GLUCOSE: 540 mg/dL — AB (ref 65–99)
HCT: 33 % — ABNORMAL LOW (ref 39.0–52.0)
Hemoglobin: 11.2 g/dL — ABNORMAL LOW (ref 13.0–17.0)
POTASSIUM: 3.8 mmol/L (ref 3.5–5.1)
SODIUM: 145 mmol/L (ref 135–145)
TCO2: 29 mmol/L (ref 22–32)

## 2017-05-02 LAB — CBG MONITORING, ED
GLUCOSE-CAPILLARY: 395 mg/dL — AB (ref 65–99)
GLUCOSE-CAPILLARY: 191 mg/dL — AB (ref 65–99)
Glucose-Capillary: 563 mg/dL (ref 65–99)
Glucose-Capillary: >600 mg/dL (ref 65–99)
Glucose-Capillary: 43 mg/dL — CL (ref 65–99)

## 2017-05-02 MED ORDER — METOPROLOL TARTRATE 25 MG PO TABS: 12.5000 mg | ORAL_TABLET | Freq: Once | ORAL | Status: DC

## 2017-05-02 MED ORDER — LISINOPRIL 20 MG PO TABS
40.0000 mg | ORAL_TABLET | Freq: Once | ORAL | Status: AC
  Administered 2017-05-02: 40 mg via ORAL
  Filled 2017-05-02: qty 2

## 2017-05-02 NOTE — ED Notes (Signed)
Patient given sandwich and apple juice.

## 2017-05-02 NOTE — ED Notes (Signed)
Patient given apple juice d/t CBG of 43. Dr. Patria Maneampos made aware. New orders to give patient sandwich and check blood sugar in 30 minutes.

## 2017-05-02 NOTE — ED Provider Notes (Signed)
4:41 AM Patient care assumed from Mike HartKelly Gekas, PA-C at change of shift.  Patient with sugar over 700 on arrival.  This has improved to 540 following insulin drip.  Anion gap has also improved from 16 down to 9 with insulin gtt.  Plan for discharge with instruction for PCP follow up.   Results for orders placed or performed during the hospital encounter of 05/01/17  I-stat Chem 8, ED  Result Value Ref Range   Sodium 134 (L) 135 - 145 mmol/L   Potassium 4.4 3.5 - 5.1 mmol/L   Chloride 97 (L) 101 - 111 mmol/L   BUN 21 (H) 6 - 20 mg/dL   Creatinine, Ser 1.611.00 0.61 - 1.24 mg/dL   Glucose, Bld >096>700 (HH) 65 - 99 mg/dL   Calcium, Ion 0.451.21 4.091.15 - 1.40 mmol/L   TCO2 21 (L) 22 - 32 mmol/L   Hemoglobin 11.9 (L) 13.0 - 17.0 g/dL   HCT 81.135.0 (L) 91.439.0 - 78.252.0 %   Comment NOTIFIED PHYSICIAN   CBG monitoring, ED  Result Value Ref Range   Glucose-Capillary >600 (HH) 65 - 99 mg/dL   Comment 1 Notify RN    Comment 2 Document in Chart   CBG monitoring, ED  Result Value Ref Range   Glucose-Capillary 563 (HH) 65 - 99 mg/dL   Comment 1 Notify RN   I-stat chem 8, ed  Result Value Ref Range   Sodium 145 135 - 145 mmol/L   Potassium 3.8 3.5 - 5.1 mmol/L   Chloride 107 101 - 111 mmol/L   BUN 18 6 - 20 mg/dL   Creatinine, Ser 9.560.70 0.61 - 1.24 mg/dL   Glucose, Bld 213540 (HH) 65 - 99 mg/dL   Calcium, Ion 0.861.27 5.781.15 - 1.40 mmol/L   TCO2 29 22 - 32 mmol/L   Hemoglobin 11.2 (L) 13.0 - 17.0 g/dL   HCT 46.933.0 (L) 62.939.0 - 52.852.0 %   Comment NOTIFIED PHYSICIAN      Antony MaduraHumes, Cayleb Jarnigan, PA-C 05/02/17 [phone]

## 2017-05-02 NOTE — ED Provider Notes (Signed)
[facility] DEPT Provider Note   CSN: [id number] Arrival date & time: 05/01/17  2011     History   Chief Complaint Chief Complaint  Patient presents with  . Hyperglycemia    HPI Mike [name] is a 57 y.o. male who presents with hyperglycemia. PMH significant for frequent ED visits for hyperglycemia and insulin dependent DM, homelessness, medical noncompliance. He is unable to contribute much to his history. He was apparently found exposing himself in [place] and [employer] and EMS were called. CBG was 534. He is unable to contribute much to history. When asked what I could do for him tonight he states "nothing". He denies chest pain, SOB, abdominal pain, N/V however he clearly has vomiting around his mouth.   LEVEL 5 CAVEAT due to psychiatric d/o  HPI  Past Medical History:  Diagnosis Date  . Anxiety   . Anxiety   . Chronic lower back pain   . Depression   . DKA (diabetic ketoacidoses) ([place]) 07/30/2016  . Hyperlipemia   . Hypertension   . Migraine    "last one was ~ 4 yr ago" (12/23/2014)  . Seizures ([place])    "related to pills for anxiety; if I don't take the pills I'm suppose to take I'll have them" (12/23/2014)  . Type II diabetes mellitus [facility])     Patient Active Problem List   Diagnosis Date Noted  . AKI (acute kidney injury) ([place]) 03/04/2017  . Malnutrition of moderate degree 02/08/2017  . DKA (diabetic ketoacidoses) ([place]) 02/07/2017  . History of stroke 02/07/2017  . Hyperphosphatemia 12/13/2016  . Evaluation by psychiatric service required 11/30/2016  . Lactic acidosis 11/18/2016  . Normocytic anemia 11/06/2016  . HLD (hyperlipidemia) 11/06/2016  . Adjustment disorder with other symptoms 11/05/2016  . Uncontrolled type 2 diabetes mellitus with hyperosmolar nonketotic hyperglycemia ([place]) 08/24/2016  . Essential hypertension 08/24/2016  . Acute ischemic stroke ([place])   . Protein-calorie malnutrition, severe 12/24/2014  . Homelessness  12/23/2014  . Hypertensive urgency 12/23/2014  . DM (diabetes mellitus) ([place]) 12/23/2014  . Intermittent palpitations 12/23/2014  . Tobacco abuse 12/23/2014  . Pleuritic chest pain 12/23/2014  . Abnormal EKG 12/23/2014  . Type II diabetes mellitus with renal manifestations [facility])     Past Surgical History:  Procedure Laterality Date  . NO PAST SURGERIES          Home Medications    Prior to Admission medications   Medication Sig Start Date End Date Taking? Authorizing Provider  aspirin 325 MG tablet Take 1 tablet (325 mg total) by mouth daily. 03/08/17  Yes Georgette Shell, MD  atorvastatin (LIPITOR) 40 MG tablet Take 1 tablet (40 mg total) by mouth daily. 01/26/17  Yes Cardama, Grayce Sessions, MD  ferrous sulfate 325 (65 FE) MG tablet Take 1 tablet (325 mg total) daily by mouth. Patient taking differently: Take 325 mg by mouth daily as needed (low iron).  11/23/16  Yes Ward, Cyril Mourning N, DO  insulin aspart protamine- aspart (NOVOLOG MIX 70/30) (70-30) 100 UNIT/ML injection Inject 0.3 mLs (30 Units total) into the skin 2 (two) times daily with a meal. 04/30/17  Yes Dorie Rank, MD  lisinopril (PRINIVIL,ZESTRIL) 40 MG tablet Take 1 tablet (40 mg total) by mouth daily. 04/30/17  Yes Dorie Rank, MD  metoprolol tartrate (LOPRESSOR) 25 MG tablet Take 0.5 tablets (12.5 mg total) by mouth 2 (two) times daily. 03/07/17  Yes Georgette Shell, MD  blood glucose meter kit and supplies KIT Dispense based on  patient and insurance preference. Use up to four times daily as directed. (FOR ICD-9 250.00, 250.01). 11/22/16   Velvet Bathe, MD    Family History Family History  Problem Relation Age of Onset  . Diabetes Mellitus II Mother   . Diabetes Mellitus II Father     Social History Social History   Tobacco Use  . Smoking status: Current Every Day Smoker    Packs/day: 0.10    Years: 40.00    Pack years: 4.00    Types: Cigarettes  . Smokeless tobacco: Never Used  Substance Use Topics  .  Alcohol use: Yes    Alcohol/week: 1.2 oz    Types: 2 Cans of beer per week  . Drug use: No    Comment: 12/23/2014 "stopped ~ 10 yrs ago"     Allergies   Ibuprofen and Tylenol [acetaminophen]   Review of Systems Review of Systems  Unable to perform ROS: Psychiatric disorder  Respiratory: Negative for shortness of breath.   Cardiovascular: Negative for chest pain.  Gastrointestinal: Negative for abdominal pain, nausea and vomiting.  Endocrine: Positive for polyuria.     Physical Exam Updated Vital Signs There were no vitals taken for this visit.  Physical Exam  Constitutional: He is oriented to person, place, and time. No distress.  Disheveled, malodorous. Urinating on himself. Dried vomit around mouth  HENT:  Head: Normocephalic and atraumatic.  Eyes: Pupils are equal, round, and reactive to light. Conjunctivae are normal. Right eye exhibits no discharge. Left eye exhibits no discharge. No scleral icterus.  Neck: Normal range of motion.  Cardiovascular: Normal rate and regular rhythm.  Pulmonary/Chest: Effort normal and breath sounds normal. No respiratory distress.  Abdominal: Soft. Bowel sounds are normal. He exhibits no distension. There is no tenderness.  Neurological: He is alert and oriented to person, place, and time.  Skin: Skin is warm and dry.  Psychiatric: He has a normal mood and affect. His behavior is normal.  Nursing note and vitals reviewed.    ED Treatments / Results  Labs (all labs ordered are listed, but only abnormal results are displayed) Labs Reviewed  I-STAT CHEM 8, ED - Abnormal; Notable for the following components:      Result Value   Sodium 134 (*)    Chloride 97 (*)    BUN 21 (*)    Glucose, Bld >700 (*)    TCO2 21 (*)    Hemoglobin 11.9 (*)    HCT 35.0 (*)    All other components within normal limits    EKG None  Radiology No results found.  Procedures Procedures (including critical care time)  Medications Ordered in  ED Medications  insulin regular (NOVOLIN R,HUMULIN R) 100 Units in sodium chloride 0.9 % 100 mL (1 Units/mL) infusion (has no administration in time range)  potassium chloride 10 mEq in 100 mL IVPB (has no administration in time range)  sodium chloride 0.9 % bolus 2,000 mL (has no administration in time range)  dextrose 5 %-0.45 % sodium chloride infusion (has no administration in time range)  potassium chloride 10 mEq in 100 mL IVPB (has no administration in time range)     Initial Impression / Assessment and Plan / ED Course  I have reviewed the triage vital signs and the nursing notes.  Pertinent labs & imaging results that were available during my care of the patient were reviewed by me and considered in my medical decision making (see chart for details).  57 year old male  presents with hyperglycemia.  His blood pressure is markedly elevated which is unfortunately normal for him due to his noncompliance.  Of other vital signs are normal.  On exam he is alert and abdomen is non-tender. He is not able to give me much of a history. I-stat Chem 8 is remarkable for hyponatremia (134), hypochloremia (97), glucose >700, bicarb of 21. Calculated anion gap is 16. Discussed with Dr. Regenia Skeeter. Will give fluids and initiate glucose stabilizer.   Care was transferred to [facility] who will determine dispo. Plan to recheck BMP after fluids and insulin and d/c if gap has closed vs admit if not.  Final Clinical Impressions(s) / ED Diagnoses   Final diagnoses:  Diabetic ketoacidosis without coma associated with type 1 diabetes mellitus [facility])    ED Discharge Orders    None       Recardo Evangelist, PA-C 05/02/17 0120    Sherwood Gambler, MD 05/02/17 [phone]

## 2017-05-03 ENCOUNTER — Encounter (HOSPITAL_COMMUNITY): Payer: Self-pay

## 2017-05-03 ENCOUNTER — Emergency Department (HOSPITAL_COMMUNITY)
Admission: EM | Admit: 2017-05-03 | Discharge: 2017-05-04 | Disposition: A | Discharge status: Home / Self Care | Payer: Self-pay | Attending: Emergency Medicine | Admitting: Emergency Medicine

## 2017-05-03 DIAGNOSIS — Z794 Long term (current) use of insulin: Secondary | ICD-10-CM | POA: Insufficient documentation

## 2017-05-03 DIAGNOSIS — Z7982 Long term (current) use of aspirin: Secondary | ICD-10-CM | POA: Insufficient documentation

## 2017-05-03 DIAGNOSIS — R739 Hyperglycemia, unspecified: Secondary | ICD-10-CM

## 2017-05-03 DIAGNOSIS — Z79899 Other long term (current) drug therapy: Secondary | ICD-10-CM | POA: Insufficient documentation

## 2017-05-03 DIAGNOSIS — F17210 Nicotine dependence, cigarettes, uncomplicated: Secondary | ICD-10-CM | POA: Insufficient documentation

## 2017-05-03 DIAGNOSIS — E1165 Type 2 diabetes mellitus with hyperglycemia: Secondary | ICD-10-CM | POA: Insufficient documentation

## 2017-05-03 DIAGNOSIS — I10 Essential (primary) hypertension: Secondary | ICD-10-CM | POA: Insufficient documentation

## 2017-05-03 LAB — BASIC METABOLIC PANEL
Anion gap: 12 (ref 5–15)
BUN: 20 mg/dL (ref 6–20)
CO2: 24 mmol/L (ref 22–32)
Calcium: 9.2 mg/dL (ref 8.9–10.3)
Chloride: 91 mmol/L — ABNORMAL LOW (ref 101–111)
Creatinine, Ser: 1.19 mg/dL (ref 0.61–1.24)
GFR calc Af Amer: >60 mL/min (ref >60)
GFR calc non Af Amer: >60 mL/min (ref >60)
Glucose, Bld: 845 mg/dL (ref 65–99)
Potassium: 4.5 mmol/L (ref 3.5–5.1)
Sodium: 127 mmol/L — ABNORMAL LOW (ref 135–145)

## 2017-05-03 LAB — CBC
HCT: 30.9 % — ABNORMAL LOW (ref 39.0–52.0)
Hemoglobin: 9.9 g/dL — ABNORMAL LOW (ref 13.0–17.0)
MCH: 25.4 pg — ABNORMAL LOW (ref 26.0–34.0)
MCHC: 32 g/dL (ref 30.0–36.0)
MCV: 79.4 fL (ref 78.0–100.0)
Platelets: 263 10*3/uL (ref 150–400)
RBC: 3.89 MIL/uL — ABNORMAL LOW (ref 4.22–5.81)
RDW: 13.4 % (ref 11.5–15.5)
WBC: 6.8 10*3/uL (ref 4.0–10.5)

## 2017-05-03 LAB — URINALYSIS, ROUTINE W REFLEX MICROSCOPIC
Bilirubin Urine: NEGATIVE
Glucose, UA: >=500 mg/dL — AB
Hgb urine dipstick: NEGATIVE
Ketones, ur: NEGATIVE mg/dL
Leukocytes, UA: NEGATIVE
Nitrite: NEGATIVE
Protein, ur: NEGATIVE mg/dL
Specific Gravity, Urine: 1.025 (ref 1.005–1.030)
pH: 6 (ref 5.0–8.0)

## 2017-05-03 LAB — CBG MONITORING, ED: Glucose-Capillary: >600 mg/dL (ref 65–99)

## 2017-05-03 MED ORDER — INSULIN ASPART 100 UNIT/ML ~~LOC~~ SOLN
10.0000 [IU] | Freq: Once | SUBCUTANEOUS | Status: AC
  Administered 2017-05-03: 10 [IU] via INTRAVENOUS
  Filled 2017-05-03: qty 1

## 2017-05-03 MED ORDER — SODIUM CHLORIDE 0.9 % IV BOLUS
1000.0000 mL | Freq: Once | INTRAVENOUS | Status: AC
  Administered 2017-05-03: 1000 mL via INTRAVENOUS

## 2017-05-03 NOTE — ED Notes (Signed)
[facility]ge [place] [facility] [address]

## 2017-05-03 NOTE — ED Triage Notes (Signed)
Pt was found on side of road, alerted someone to call EMS for blood sugar.  Pt reports he began to feel bad this afternoon, pt is confused; denies any pain; cbg with EMS: 548

## 2017-05-03 NOTE — ED Notes (Signed)
Pt CBG is 845. MD Juleen ChinaKohut made aware. MD wants Pt to have IV and be observed. Pt is ambulatory w/ steady gait. Charge made aware.

## 2017-05-03 NOTE — ED Notes (Signed)
Check CBG meter reads high, RN Jessica informed

## 2017-05-03 NOTE — ED Provider Notes (Signed)
[facility] EMERGENCY DEPARTMENT Provider Note   CSN: [id number] Arrival date & time: 05/03/17  1923     History   Chief Complaint Chief Complaint  Patient presents with  . Hyperglycemia    HPI Mike [name] is a 57 y.o. male.   Patient well-known to the emergency department presents with uncontrolled hyperglycemia.  Last visit for the same was early this morning.  Patient reports taking insulin, last dose this morning.  He denies other medical complaints.  Denies vomiting, abdominal pain, fevers.  Blood sugar here in the 800s.  Patient requesting something to drink.  The onset of this condition was acute.  The course is recurrent. Aggravating factors: none. Alleviating factors: none.       Past Medical History:  Diagnosis Date  . Anxiety   . Anxiety   . Chronic lower back pain   . Depression   . DKA (diabetic ketoacidoses) ([place]) 07/30/2016  . Hyperlipemia   . Hypertension   . Migraine    "last one was ~ 4 yr ago" (12/23/2014)  . Seizures ([place])    "related to pills for anxiety; if I don't take the pills I'm suppose to take I'll have them" (12/23/2014)  . Type II diabetes mellitus [facility])     Patient Active Problem List   Diagnosis Date Noted  . AKI (acute kidney injury) ([place]) 03/04/2017  . Malnutrition of moderate degree 02/08/2017  . DKA (diabetic ketoacidoses) ([place]) 02/07/2017  . History of stroke 02/07/2017  . Hyperphosphatemia 12/13/2016  . Evaluation by psychiatric service required 11/30/2016  . Lactic acidosis 11/18/2016  . Normocytic anemia 11/06/2016  . HLD (hyperlipidemia) 11/06/2016  . Adjustment disorder with other symptoms 11/05/2016  . Uncontrolled type 2 diabetes mellitus with hyperosmolar nonketotic hyperglycemia ([place]) 08/24/2016  . Essential hypertension 08/24/2016  . Acute ischemic stroke ([place])   . Protein-calorie malnutrition, severe 12/24/2014  . Homelessness 12/23/2014  . Hypertensive urgency 12/23/2014  . DM (diabetes  mellitus) ([place]) 12/23/2014  . Intermittent palpitations 12/23/2014  . Tobacco abuse 12/23/2014  . Pleuritic chest pain 12/23/2014  . Abnormal EKG 12/23/2014  . Type II diabetes mellitus with renal manifestations [facility])     Past Surgical History:  Procedure Laterality Date  . NO PAST SURGERIES          Home Medications    Prior to Admission medications   Medication Sig Start Date End Date Taking? Authorizing Provider  aspirin 325 MG tablet Take 1 tablet (325 mg total) by mouth daily. 03/08/17   Georgette Shell, MD  atorvastatin (LIPITOR) 40 MG tablet Take 1 tablet (40 mg total) by mouth daily. 01/26/17   Cardama, Grayce Sessions, MD  blood glucose meter kit and supplies KIT Dispense based on patient and insurance preference. Use up to four times daily as directed. (FOR ICD-9 250.00, 250.01). 11/22/16   Velvet Bathe, MD  ferrous sulfate 325 (65 FE) MG tablet Take 1 tablet (325 mg total) daily by mouth. Patient taking differently: Take 325 mg by mouth daily as needed (low iron).  11/23/16   Ward, Delice Bison, DO  insulin aspart protamine- aspart (NOVOLOG MIX 70/30) (70-30) 100 UNIT/ML injection Inject 0.3 mLs (30 Units total) into the skin 2 (two) times daily with a meal. 04/30/17   Dorie Rank, MD  lisinopril (PRINIVIL,ZESTRIL) 40 MG tablet Take 1 tablet (40 mg total) by mouth daily. 04/30/17   Dorie Rank, MD  metoprolol tartrate (LOPRESSOR) 25 MG tablet Take 0.5 tablets (12.5 mg total) by mouth  2 (two) times daily. 03/07/17   Georgette Shell, MD    Family History Family History  Problem Relation Age of Onset  . Diabetes Mellitus II Mother   . Diabetes Mellitus II Father     Social History Social History   Tobacco Use  . Smoking status: Current Every Day Smoker    Packs/day: 0.10    Years: 40.00    Pack years: 4.00    Types: Cigarettes  . Smokeless tobacco: Never Used  Substance Use Topics  . Alcohol use: Yes    Alcohol/week: 1.2 oz    Types: 2 Cans of beer per week  .  Drug use: No    Comment: 12/23/2014 "stopped ~ 10 yrs ago"     Allergies   Ibuprofen and Tylenol [acetaminophen]   Review of Systems Review of Systems  Constitutional: Negative for fever.  HENT: Negative for rhinorrhea and sore throat.   Eyes: Negative for redness.  Respiratory: Negative for cough.   Cardiovascular: Negative for chest pain.  Gastrointestinal: Negative for abdominal pain, diarrhea, nausea and vomiting.  Genitourinary: Positive for frequency. Negative for dysuria.  Musculoskeletal: Negative for myalgias.  Skin: Negative for rash.  Neurological: Negative for headaches.     Physical Exam Updated Vital Signs BP (!) 175/96 (BP Location: Right Arm)   Pulse 94   Temp 99.5 F (37.5 C) (Oral)   Resp 18   SpO2 100%   Physical Exam  Constitutional: He appears well-developed and well-nourished.  HENT:  Head: Normocephalic and atraumatic.  Eyes: Conjunctivae are normal. Right eye exhibits no discharge. Left eye exhibits no discharge.  Neck: Normal range of motion. Neck supple.  Cardiovascular: Normal rate, regular rhythm and normal heart sounds.  Pulmonary/Chest: Effort normal and breath sounds normal.  Abdominal: Soft. There is no tenderness. There is no rebound and no guarding.  Neurological: He is alert.  Skin: Skin is warm and dry.  Psychiatric: He has a normal mood and affect.  Nursing note and vitals reviewed.    ED Treatments / Results  Labs (all labs ordered are listed, but only abnormal results are displayed) Labs Reviewed  BASIC METABOLIC PANEL - Abnormal; Notable for the following components:      Result Value   Sodium 127 (*)    Chloride 91 (*)    Glucose, Bld 845 (*)    All other components within normal limits  CBC - Abnormal; Notable for the following components:   RBC 3.89 (*)    Hemoglobin 9.9 (*)    HCT 30.9 (*)    MCH 25.4 (*)    All other components within normal limits  URINALYSIS, ROUTINE W REFLEX MICROSCOPIC - Abnormal; Notable  for the following components:   Color, Urine COLORLESS (*)    Glucose, UA >=500 (*)    Bacteria, UA RARE (*)    Squamous Epithelial / LPF 0-5 (*)    All other components within normal limits  CBG MONITORING, ED - Abnormal; Notable for the following components:   Glucose-Capillary >600 (*)    All other components within normal limits  CBG MONITORING, ED - Abnormal; Notable for the following components:   Glucose-Capillary >600 (*)    All other components within normal limits  CBG MONITORING, ED - Abnormal; Notable for the following components:   Glucose-Capillary 472 (*)    All other components within normal limits    EKG None  Radiology No results found.  Procedures Procedures (including critical care time)  Medications Ordered in  ED Medications  sodium chloride 0.9 % bolus 1,000 mL (has no administration in time range)  sodium chloride 0.9 % bolus 1,000 mL (has no administration in time range)  insulin aspart (novoLOG) injection 10 Units (has no administration in time range)     Initial Impression / Assessment and Plan / ED Course  I have reviewed the triage vital signs and the nursing notes.  Pertinent labs & imaging results that were available during my care of the patient were reviewed by me and considered in my medical decision making (see chart for details).     Patient seen and examined.  Will hydrate and give dose of IV insulin.  Kidney function is normal.  No clinical signs of DKA and normal anion gap.  Vital signs reviewed and are as follows: BP (!) 175/96 (BP Location: Right Arm)   Pulse 94   Temp 99.5 F (37.5 C) (Oral)   Resp 18   SpO2 100%   12:27 AM Blood sugar improved into the 400s with IV hydration and IV insulin.  Comfortable discharged home at this point.  Encourage PCP follow-up.    Final Clinical Impressions(s) / ED Diagnoses   Final diagnoses:  Hyperglycemia   Pt well-known to ED. Hyperglycemia without ketosis. Pt improved with  treatment. Suspect non-compliance as underlying etiology. CBG 800's to 400's. Normal WBC. Comfortable with d/c at this time.    ED Discharge Orders    None      Carlisle Cater, Hershal Coria 05/04/17 0031  Virgel Manifold, MD 05/05/17 1006

## 2017-05-03 NOTE — ED Notes (Signed)
Pt walking around defecating and urinating on the lobby floor. Callie Charge made aware. Environmental called.

## 2017-05-03 NOTE — ED Notes (Signed)
Attempted PIV x 2. Unsuccessful at this time; second RN to attempt.

## 2017-05-03 NOTE — ED Notes (Signed)
Pt is currently drinking a [employer] coffee drink.

## 2017-05-04 LAB — CBG MONITORING, ED: Glucose-Capillary: 472 mg/dL — ABNORMAL HIGH (ref 65–99)

## 2017-05-04 NOTE — ED Notes (Signed)
Pt denies signing E signature

## 2017-05-04 NOTE — ED Notes (Signed)
Pt urinated on himself in the bed. Pt taken to bathroom to switch into purple scrubs.

## 2017-05-04 NOTE — Discharge Instructions (Signed)
Please read and follow all provided instructions.  Your diagnoses today include:  1. Hyperglycemia    Tests performed today include:  Blood counts and electrolytes -elevated blood sugar  Vital signs. See below for your results today.   Medications prescribed:   None  Take any prescribed medications only as directed.  Home care instructions:  Follow any educational materials contained in this packet.  Follow-up instructions: Please follow-up with your primary care provider for further evaluation of your symptoms.   Return instructions:   Please return to the Emergency Department if you experience worsening symptoms.   Please return if you have any other emergent concerns.  Additional Information:  Your vital signs today were: BP (!) 175/96 (BP Location: Right Arm)    Pulse 94    Temp 99.5 F (37.5 C) (Oral)    Resp 18    SpO2 100%  If your blood pressure (BP) was elevated above 135/85 this visit, please have this repeated by your doctor within one month. --------------

## 2017-05-04 NOTE — ED Notes (Signed)
Patient verbalizes understanding of discharge instructions. Opportunity for questioning and answers were provided. Armband removed by staff, pt discharged from ED.  

## 2017-05-04 NOTE — ED Notes (Signed)
Pt denies getting his vitals taken

## 2017-05-05 ENCOUNTER — Emergency Department (HOSPITAL_COMMUNITY)
Admission: EM | Admit: 2017-05-05 | Discharge: 2017-05-06 | Disposition: A | Discharge status: Home / Self Care | Payer: Self-pay | Attending: Emergency Medicine | Admitting: Emergency Medicine

## 2017-05-05 DIAGNOSIS — E1165 Type 2 diabetes mellitus with hyperglycemia: Secondary | ICD-10-CM | POA: Insufficient documentation

## 2017-05-05 DIAGNOSIS — F419 Anxiety disorder, unspecified: Secondary | ICD-10-CM | POA: Insufficient documentation

## 2017-05-05 DIAGNOSIS — F329 Major depressive disorder, single episode, unspecified: Secondary | ICD-10-CM | POA: Insufficient documentation

## 2017-05-05 DIAGNOSIS — Z7982 Long term (current) use of aspirin: Secondary | ICD-10-CM | POA: Insufficient documentation

## 2017-05-05 DIAGNOSIS — Z794 Long term (current) use of insulin: Secondary | ICD-10-CM | POA: Insufficient documentation

## 2017-05-05 DIAGNOSIS — Z79899 Other long term (current) drug therapy: Secondary | ICD-10-CM | POA: Insufficient documentation

## 2017-05-05 DIAGNOSIS — F17210 Nicotine dependence, cigarettes, uncomplicated: Secondary | ICD-10-CM | POA: Insufficient documentation

## 2017-05-05 DIAGNOSIS — R739 Hyperglycemia, unspecified: Secondary | ICD-10-CM

## 2017-05-05 DIAGNOSIS — I10 Essential (primary) hypertension: Secondary | ICD-10-CM | POA: Insufficient documentation

## 2017-05-05 LAB — CBG MONITORING, ED: GLUCOSE-CAPILLARY: 581 mg/dL — AB (ref 65–99)

## 2017-05-05 NOTE — ED Notes (Signed)
Bed: WTR9 Expected date:  Expected time:  Means of arrival:  Comments: 

## 2017-05-06 ENCOUNTER — Other Ambulatory Visit: Payer: Self-pay

## 2017-05-06 ENCOUNTER — Encounter (HOSPITAL_COMMUNITY): Payer: Self-pay | Admitting: Emergency Medicine

## 2017-05-06 ENCOUNTER — Encounter (HOSPITAL_COMMUNITY): Payer: Self-pay

## 2017-05-06 ENCOUNTER — Emergency Department (HOSPITAL_COMMUNITY)
Admission: EM | Admit: 2017-05-06 | Discharge: 2017-05-06 | Disposition: A | Discharge status: Home / Self Care | Payer: Self-pay | Attending: Emergency Medicine | Admitting: Emergency Medicine

## 2017-05-06 DIAGNOSIS — Z79899 Other long term (current) drug therapy: Secondary | ICD-10-CM | POA: Insufficient documentation

## 2017-05-06 DIAGNOSIS — R739 Hyperglycemia, unspecified: Secondary | ICD-10-CM

## 2017-05-06 DIAGNOSIS — Z794 Long term (current) use of insulin: Secondary | ICD-10-CM | POA: Insufficient documentation

## 2017-05-06 DIAGNOSIS — E1165 Type 2 diabetes mellitus with hyperglycemia: Secondary | ICD-10-CM | POA: Insufficient documentation

## 2017-05-06 DIAGNOSIS — F17210 Nicotine dependence, cigarettes, uncomplicated: Secondary | ICD-10-CM | POA: Insufficient documentation

## 2017-05-06 LAB — CBG MONITORING, ED
GLUCOSE-CAPILLARY: 564 mg/dL — AB (ref 65–99)
GLUCOSE-CAPILLARY: 384 mg/dL — AB (ref 65–99)
Glucose-Capillary: 384 mg/dL — ABNORMAL HIGH (ref 65–99)
Glucose-Capillary: 370 mg/dL — ABNORMAL HIGH (ref 65–99)
Glucose-Capillary: 488 mg/dL — ABNORMAL HIGH (ref 65–99)

## 2017-05-06 LAB — URINALYSIS, ROUTINE W REFLEX MICROSCOPIC
BILIRUBIN URINE: NEGATIVE
Bacteria, UA: NONE SEEN
KETONES UR: 20 mg/dL — AB
LEUKOCYTES UA: NEGATIVE
NITRITE: NEGATIVE
PH: 5 (ref 5.0–8.0)
Protein, ur: NEGATIVE mg/dL
Specific Gravity, Urine: 1.03 (ref 1.005–1.030)

## 2017-05-06 LAB — COMPREHENSIVE METABOLIC PANEL
ALT: 37 U/L (ref 17–63)
ANION GAP: 18 — AB (ref 5–15)
AST: 17 U/L (ref 15–41)
Albumin: 3.8 g/dL (ref 3.5–5.0)
Alkaline Phosphatase: 134 U/L — ABNORMAL HIGH (ref 38–126)
BILIRUBIN TOTAL: 0.8 mg/dL (ref 0.3–1.2)
BUN: 27 mg/dL — ABNORMAL HIGH (ref 6–20)
CALCIUM: 9.6 mg/dL (ref 8.9–10.3)
CO2: 24 mmol/L (ref 22–32)
Chloride: 111 mmol/L (ref 101–111)
Creatinine, Ser: 1.19 mg/dL (ref 0.61–1.24)
Glucose, Bld: 526 mg/dL (ref 65–99)
POTASSIUM: 3.5 mmol/L (ref 3.5–5.1)
Sodium: 153 mmol/L — ABNORMAL HIGH (ref 135–145)
TOTAL PROTEIN: 7.7 g/dL (ref 6.5–8.1)

## 2017-05-06 LAB — CBC WITH DIFFERENTIAL/PLATELET
BASOS ABS: 0 10*3/uL (ref 0.0–0.1)
BASOS PCT: 0 %
EOS ABS: 0 10*3/uL (ref 0.0–0.7)
EOS PCT: 0 %
HCT: 39.8 % (ref 39.0–52.0)
Hemoglobin: 12.1 g/dL — ABNORMAL LOW (ref 13.0–17.0)
LYMPHS PCT: 16 %
Lymphs Abs: 1.5 10*3/uL (ref 0.7–4.0)
MCH: 25.3 pg — ABNORMAL LOW (ref 26.0–34.0)
MCHC: 30.4 g/dL (ref 30.0–36.0)
MCV: 83.1 fL (ref 78.0–100.0)
Monocytes Absolute: 0.5 10*3/uL (ref 0.1–1.0)
Monocytes Relative: 6 %
Neutro Abs: 7 10*3/uL (ref 1.7–7.7)
Neutrophils Relative %: 78 %
PLATELETS: 326 10*3/uL (ref 150–400)
RBC: 4.79 MIL/uL (ref 4.22–5.81)
RDW: 13.6 % (ref 11.5–15.5)
WBC: 9 10*3/uL (ref 4.0–10.5)

## 2017-05-06 LAB — I-STAT CHEM 8, ED
BUN: 20 mg/dL (ref 6–20)
CALCIUM ION: 1.17 mmol/L (ref 1.15–1.40)
CHLORIDE: 97 mmol/L — AB (ref 101–111)
Creatinine, Ser: 0.7 mg/dL (ref 0.61–1.24)
Glucose, Bld: 534 mg/dL (ref 65–99)
HCT: 39 % (ref 39.0–52.0)
Hemoglobin: 13.3 g/dL (ref 13.0–17.0)
POTASSIUM: 4.4 mmol/L (ref 3.5–5.1)
Sodium: 136 mmol/L (ref 135–145)
TCO2: 27 mmol/L (ref 22–32)

## 2017-05-06 MED ORDER — INSULIN ASPART 100 UNIT/ML ~~LOC~~ SOLN: 10.0000 [IU] | Freq: Once | SUBCUTANEOUS | Status: DC

## 2017-05-06 MED ORDER — SODIUM CHLORIDE 0.9 % IV BOLUS
1000.0000 mL | Freq: Once | INTRAVENOUS | Status: AC
  Administered 2017-05-06: 1000 mL via INTRAVENOUS

## 2017-05-06 MED ORDER — INSULIN ASPART PROT & ASPART (70-30 MIX) 100 UNIT/ML ~~LOC~~ SUSP
30.0000 [IU] | Freq: Two times a day (BID) | SUBCUTANEOUS | Status: DC
  Administered 2017-05-06: 30 [IU] via SUBCUTANEOUS
  Filled 2017-05-06: qty 10

## 2017-05-06 MED ORDER — INSULIN ASPART 100 UNIT/ML ~~LOC~~ SOLN
6.0000 [IU] | Freq: Once | SUBCUTANEOUS | Status: AC
  Administered 2017-05-06: 6 [IU] via INTRAVENOUS
  Filled 2017-05-06: qty 1

## 2017-05-06 MED ORDER — INSULIN ASPART 100 UNIT/ML ~~LOC~~ SOLN
10.0000 [IU] | Freq: Once | SUBCUTANEOUS | Status: AC
  Administered 2017-05-06: 10 [IU] via INTRAVENOUS
  Filled 2017-05-06: qty 1

## 2017-05-06 MED ORDER — ONDANSETRON HCL 4 MG/2ML IJ SOLN
4.0000 mg | Freq: Once | INTRAMUSCULAR | Status: AC
  Administered 2017-05-06: 4 mg via INTRAVENOUS
  Filled 2017-05-06: qty 2

## 2017-05-06 NOTE — ED Triage Notes (Signed)
Pt presents by EMS for evaluation of hyperglycemia. EMS reports CBG of 331.

## 2017-05-06 NOTE — ED Provider Notes (Addendum)
TIME SEEN: 12:36 AM  CHIEF COMPLAINT: Hyperglycemia  HPI: Patient is a 57 year old male with history of hypertension, hyperlipidemia, diabetes with medical noncompliance who presents to the emergency department with hyperglycemia.  He is well-known to our emergency department with 67 visits in the past 6 months.  Patient is known to eat sugar packets in order to get his blood sugar elevated so he can come to the ER for a place to stay.  He denies any pain at this time.  States he has had vomiting today.  No other acute complaints.  ROS: See HPI Constitutional: no fever  Eyes: no drainage  ENT: no runny nose   Cardiovascular:  no chest pain  Resp: no SOB  GI:  vomiting GU: no dysuria Integumentary: no rash  Allergy: no hives  Musculoskeletal: no leg swelling  Neurological: no slurred speech ROS otherwise negative  PAST MEDICAL HISTORY/PAST SURGICAL HISTORY:  Past Medical History:  Diagnosis Date  . Anxiety   . Anxiety   . Chronic lower back pain   . Depression   . DKA (diabetic ketoacidoses) ([place]) 07/30/2016  . Hyperlipemia   . Hypertension   . Migraine    "last one was ~ 4 yr ago" (12/23/2014)  . Seizures ([place])    "related to pills for anxiety; if I don't take the pills I'm suppose to take I'll have them" (12/23/2014)  . Type II diabetes mellitus ([facility])     MEDICATIONS:  Prior to Admission medications   Medication Sig Start Date End Date Taking? Authorizing Provider  aspirin 325 MG tablet Take 1 tablet (325 mg total) by mouth daily. 03/08/17   Georgette Shell, MD  atorvastatin (LIPITOR) 40 MG tablet Take 1 tablet (40 mg total) by mouth daily. 01/26/17   Cardama, Grayce Sessions, MD  blood glucose meter kit and supplies KIT Dispense based on patient and insurance preference. Use up to four times daily as directed. (FOR ICD-9 250.00, 250.01). 11/22/16   Velvet Bathe, MD  ferrous sulfate 325 (65 FE) MG tablet Take 1 tablet (325 mg total) daily by mouth. Patient taking  differently: Take 325 mg by mouth daily as needed (low iron).  11/23/16   Oliviarose Punch, Delice Bison, DO  insulin aspart protamine- aspart (NOVOLOG MIX 70/30) (70-30) 100 UNIT/ML injection Inject 0.3 mLs (30 Units total) into the skin 2 (two) times daily with a meal. 04/30/17   Dorie Rank, MD  lisinopril (PRINIVIL,ZESTRIL) 40 MG tablet Take 1 tablet (40 mg total) by mouth daily. 04/30/17   Dorie Rank, MD  metoprolol tartrate (LOPRESSOR) 25 MG tablet Take 0.5 tablets (12.5 mg total) by mouth 2 (two) times daily. 03/07/17   Georgette Shell, MD    ALLERGIES:  Allergies  Allergen Reactions  . Ibuprofen Nausea And Vomiting and Other (See Comments)    Makes the patient feel bloated also  . Tylenol [Acetaminophen] Nausea And Vomiting and Other (See Comments)    Makes the patient feel bloated also    SOCIAL HISTORY:  Social History   Tobacco Use  . Smoking status: Current Every Day Smoker    Packs/day: 0.10    Years: 40.00    Pack years: 4.00    Types: Cigarettes  . Smokeless tobacco: Never Used  Substance Use Topics  . Alcohol use: Yes    Alcohol/week: 1.2 oz    Types: 2 Cans of beer per week    FAMILY HISTORY: Family History  Problem Relation Age of Onset  . Diabetes Mellitus II Mother   .  Diabetes Mellitus II Father     EXAM: BP (!) 154/86 (BP Location: Right Arm)   Pulse 85   Temp 98 F (36.7 C) (Oral)   Resp 18   SpO2 99%  CONSTITUTIONAL: Alert and oriented and responds appropriately to questions.  Thin, chronically ill-appearing HEAD: Normocephalic EYES: Conjunctivae clear, pupils appear equal, EOMI ENT: normal nose; moist mucous membranes NECK: Supple, no meningismus, no nuchal rigidity, no LAD  CARD: RRR; S1 and S2 appreciated; no murmurs, no clicks, no rubs, no gallops RESP: Normal chest excursion without splinting or tachypnea; breath sounds clear and equal bilaterally; no wheezes, no rhonchi, no rales, no hypoxia or respiratory distress, speaking full sentences ABD/GI:  Normal bowel sounds; non-distended; soft, non-tender, no rebound, no guarding, no peritoneal signs, no hepatosplenomegaly BACK:  The back appears normal and is non-tender to palpation, there is no CVA tenderness EXT: Normal ROM in all joints; non-tender to palpation; no edema; normal capillary refill; no cyanosis, no calf tenderness or swelling    SKIN: Normal color for age and race; warm; no rash NEURO: Moves all extremities equally PSYCH: The patient's mood and manner are appropriate. Grooming and personal hygiene are appropriate.  MEDICAL DECISION MAKING: Patient here with hyperglycemia.  Blood sugar found to be 581.  Will check labs and urine to see if there is any sign of DKA given he has had DKA before and has been vomiting.  We will start IV fluids and give IV insulin, IV Zofran.  He will be kept n.p.o.  ED PROGRESS: Patient's blood sugar has improved into the 300s.  I feel he is safe to be discharged.  No signs of DKA. He does have a slightly elevated anion gap but suspect this is from his BUN being elevated as he has a normal bicarb.  He has received 3 L of IV fluids and IV insulin in the ED. He has been able to drink without vomiting in the emergency department.  At this time, I do not feel there is any life-threatening condition present. I have reviewed and discussed all results (EKG, imaging, lab, urine as appropriate) and exam findings with patient/family. I have reviewed nursing notes and appropriate previous records.  I feel the patient is safe to be discharged home without further emergent workup and can continue workup as an outpatient as needed. Discussed usual and customary return precautions. Patient/family verbalize understanding and are comfortable with this plan.  Outpatient follow-up has been provided if needed. All questions have been answered.      Ewan Grau, Delice Bison, DO 05/06/17 T[place], Delice Bison, DO 05/06/17 [phone]

## 2017-05-06 NOTE — ED Notes (Signed)
  MD AND RN NOTIFIED OF PATIENTS CHEM8 /GLUCOSE LEVEL OF 534

## 2017-05-06 NOTE — ED Notes (Signed)
Pt given urinal and advised that if pt was not able to provide a sample then a catheter would be utilized.

## 2017-05-06 NOTE — Discharge Instructions (Addendum)
Monitor your diet, make sure to take your medications

## 2017-05-06 NOTE — ED Provider Notes (Signed)
[facility] DEPT Provider Note   CSN: [id number] Arrival date & time: 05/06/17  1506     History   Chief Complaint Chief Complaint  Patient presents with  . Blood Sugar Problem    HPI Mike [name] is a 57 y.o. male.  HPI Patient presents to the emergency room for evaluation of hyperglycemia.  Patient is well-known to the emergency department.  He has had over 60 visits in the past 6 months.  Patient was last seen in the emergency room earlier this morning at 6 AM.  Patient was treated for hyperglycemia without any signs of DKA.  Patient's blood sugar had improved into the 300s.  Patient states he went to get something to eat and feels like he may be in the wrong food.  Patient does have a history of eating sugar packets in the past.  Patient denies any trouble with nausea vomiting.  No fevers or chills. Past Medical History:  Diagnosis Date  . Anxiety   . Anxiety   . Chronic lower back pain   . Depression   . DKA (diabetic ketoacidoses) ([place]) 07/30/2016  . Hyperlipemia   . Hypertension   . Migraine    "last one was ~ 4 yr ago" (12/23/2014)  . Seizures ([place])    "related to pills for anxiety; if I don't take the pills I'm suppose to take I'll have them" (12/23/2014)  . Type II diabetes mellitus [facility])     Patient Active Problem List   Diagnosis Date Noted  . AKI (acute kidney injury) ([place]) 03/04/2017  . Malnutrition of moderate degree 02/08/2017  . DKA (diabetic ketoacidoses) ([place]) 02/07/2017  . History of stroke 02/07/2017  . Hyperphosphatemia 12/13/2016  . Evaluation by psychiatric service required 11/30/2016  . Lactic acidosis 11/18/2016  . Normocytic anemia 11/06/2016  . HLD (hyperlipidemia) 11/06/2016  . Adjustment disorder with other symptoms 11/05/2016  . Uncontrolled type 2 diabetes mellitus with hyperosmolar nonketotic hyperglycemia ([place]) 08/24/2016  . Essential hypertension 08/24/2016  . Acute ischemic stroke ([place])   .  Protein-calorie malnutrition, severe 12/24/2014  . Homelessness 12/23/2014  . Hypertensive urgency 12/23/2014  . DM (diabetes mellitus) ([place]) 12/23/2014  . Intermittent palpitations 12/23/2014  . Tobacco abuse 12/23/2014  . Pleuritic chest pain 12/23/2014  . Abnormal EKG 12/23/2014  . Type II diabetes mellitus with renal manifestations [facility])     Past Surgical History:  Procedure Laterality Date  . NO PAST SURGERIES          Home Medications    Prior to Admission medications   Medication Sig Start Date End Date Taking? Authorizing Provider  aspirin 325 MG tablet Take 1 tablet (325 mg total) by mouth daily. 03/08/17   Georgette Shell, MD  atorvastatin (LIPITOR) 40 MG tablet Take 1 tablet (40 mg total) by mouth daily. 01/26/17   Cardama, Grayce Sessions, MD  blood glucose meter kit and supplies KIT Dispense based on patient and insurance preference. Use up to four times daily as directed. (FOR ICD-9 250.00, 250.01). 11/22/16   Velvet Bathe, MD  ferrous sulfate 325 (65 FE) MG tablet Take 1 tablet (325 mg total) daily by mouth. Patient taking differently: Take 325 mg by mouth daily as needed (low iron).  11/23/16   Ward, Delice Bison, DO  insulin aspart protamine- aspart (NOVOLOG MIX 70/30) (70-30) 100 UNIT/ML injection Inject 0.3 mLs (30 Units total) into the skin 2 (two) times daily with a meal. 04/30/17   Dorie Rank, MD  lisinopril (PRINIVIL,ZESTRIL)  40 MG tablet Take 1 tablet (40 mg total) by mouth daily. 04/30/17   Dorie Rank, MD  metoprolol tartrate (LOPRESSOR) 25 MG tablet Take 0.5 tablets (12.5 mg total) by mouth 2 (two) times daily. 03/07/17   Georgette Shell, MD    Family History Family History  Problem Relation Age of Onset  . Diabetes Mellitus II Mother   . Diabetes Mellitus II Father     Social History Social History   Tobacco Use  . Smoking status: Current Every Day Smoker    Packs/day: 0.10    Years: 40.00    Pack years: 4.00    Types: Cigarettes  . Smokeless  tobacco: Never Used  Substance Use Topics  . Alcohol use: Yes    Alcohol/week: 1.2 oz    Types: 2 Cans of beer per week  . Drug use: No    Comment: 12/23/2014 "stopped ~ 10 yrs ago"     Allergies   Ibuprofen and Tylenol [acetaminophen]   Review of Systems Review of Systems  All other systems reviewed and are negative.    Physical Exam Updated Vital Signs BP (!) 167/79 (BP Location: Left Arm)   Pulse 68   Temp (!) 97.5 F (36.4 C) (Oral)   Resp 16   Ht 1.727 m ('5\' 8"'$ )   Wt 77.1 kg (170 lb)   SpO2 100%   BMI 25.85 kg/m   Physical Exam  Constitutional: No distress.  Disheveled  HENT:  Head: Normocephalic and atraumatic.  Right Ear: External ear normal.  Left Ear: External ear normal.  Eyes: Conjunctivae are normal. Right eye exhibits no discharge. Left eye exhibits no discharge. No scleral icterus.  Neck: Neck supple. No tracheal deviation present.  Cardiovascular: Normal rate, regular rhythm and intact distal pulses.  Pulmonary/Chest: Effort normal and breath sounds normal. No stridor. No respiratory distress. He has no wheezes. He has no rales.  Abdominal: Soft. Bowel sounds are normal. He exhibits no distension. There is no tenderness. There is no rebound and no guarding.  Musculoskeletal: He exhibits no edema or tenderness.  Neurological: He is alert. He has normal strength. No cranial nerve deficit (no facial droop, extraocular movements intact, no slurred speech) or sensory deficit. He exhibits normal muscle tone. He displays no seizure activity. Coordination normal.  Skin: Skin is warm and dry. No rash noted.  Psychiatric: He has a normal mood and affect.  Nursing note and vitals reviewed.    ED Treatments / Results  Labs (all labs ordered are listed, but only abnormal results are displayed) Labs Reviewed  I-STAT CHEM 8, ED - Abnormal; Notable for the following components:      Result Value   Chloride 97 (*)    Glucose, Bld 534 (*)    All other  components within normal limits  CBG MONITORING, ED - Abnormal; Notable for the following components:   Glucose-Capillary 488 (*)    All other components within normal limits   Procedures Procedures (including critical care time)  Medications Ordered in ED Medications  insulin aspart protamine- aspart (NOVOLOG MIX 70/30) injection 30 Units (has no administration in time range)     Initial Impression / Assessment and Plan / ED Course  I have reviewed the triage vital signs and the nursing notes.  Pertinent labs & imaging results that were available during my care of the patient were reviewed by me and considered in my medical decision making (see chart for details).   Patient's laboratory tests are consistent with  hyperglycemia but no signs of diabetic ketoacidosis.  Patient frequently comes back to the emergency room with persistent hyperglycemia despite whatever treatment we give him in the emergency room.  Unfortunately he does not seem to have good insight into his illness and does not comply with any dietary restrictions.  She does not tend to take his medications other than what we provide for him here in the emergency room.  I will give the patient a dose of his insulin.  No indication for IV fluids or IV insulin at this time.  I  encouraged the patient to try to follow a diabetic diet and to take his medications.  Final Clinical Impressions(s) / ED Diagnoses   Final diagnoses:  Hyperglycemia      Dorie Rank, MD 05/06/17 1643

## 2017-05-06 NOTE — ED Triage Notes (Signed)
Pt states that he ate the wrong thing and his sugar is high.

## 2017-05-07 ENCOUNTER — Emergency Department (HOSPITAL_COMMUNITY)
Admission: EM | Admit: 2017-05-07 | Discharge: 2017-05-08 | Disposition: A | Discharge status: Home / Self Care | Payer: Self-pay | Attending: Emergency Medicine | Admitting: Emergency Medicine

## 2017-05-07 ENCOUNTER — Encounter (HOSPITAL_COMMUNITY): Payer: Self-pay | Admitting: Emergency Medicine

## 2017-05-07 DIAGNOSIS — F17210 Nicotine dependence, cigarettes, uncomplicated: Secondary | ICD-10-CM | POA: Insufficient documentation

## 2017-05-07 DIAGNOSIS — E1165 Type 2 diabetes mellitus with hyperglycemia: Secondary | ICD-10-CM | POA: Insufficient documentation

## 2017-05-07 DIAGNOSIS — R739 Hyperglycemia, unspecified: Secondary | ICD-10-CM

## 2017-05-07 DIAGNOSIS — Z79899 Other long term (current) drug therapy: Secondary | ICD-10-CM | POA: Insufficient documentation

## 2017-05-07 DIAGNOSIS — Z9114 Patient's other noncompliance with medication regimen: Secondary | ICD-10-CM | POA: Insufficient documentation

## 2017-05-07 DIAGNOSIS — Z794 Long term (current) use of insulin: Secondary | ICD-10-CM | POA: Insufficient documentation

## 2017-05-07 DIAGNOSIS — Z7982 Long term (current) use of aspirin: Secondary | ICD-10-CM | POA: Insufficient documentation

## 2017-05-07 DIAGNOSIS — I10 Essential (primary) hypertension: Secondary | ICD-10-CM | POA: Insufficient documentation

## 2017-05-07 LAB — URINALYSIS, ROUTINE W REFLEX MICROSCOPIC
Bacteria, UA: NONE SEEN
Bilirubin Urine: NEGATIVE
Glucose, UA: >=500 mg/dL — AB
Hgb urine dipstick: NEGATIVE
KETONES UR: NEGATIVE mg/dL
Leukocytes, UA: NEGATIVE
Nitrite: NEGATIVE
PROTEIN: NEGATIVE mg/dL
Specific Gravity, Urine: 1.027 (ref 1.005–1.030)
pH: 6 (ref 5.0–8.0)

## 2017-05-07 LAB — CBC
HCT: 33.9 % — ABNORMAL LOW (ref 39.0–52.0)
HEMOGLOBIN: 10.8 g/dL — AB (ref 13.0–17.0)
MCH: 25.7 pg — AB (ref 26.0–34.0)
MCHC: 31.9 g/dL (ref 30.0–36.0)
MCV: 80.5 fL (ref 78.0–100.0)
Platelets: 322 10*3/uL (ref 150–400)
RBC: 4.21 MIL/uL — AB (ref 4.22–5.81)
RDW: 13.8 % (ref 11.5–15.5)
WBC: 5.1 10*3/uL (ref 4.0–10.5)

## 2017-05-07 LAB — BASIC METABOLIC PANEL
ANION GAP: 13 (ref 5–15)
BUN: 18 mg/dL (ref 6–20)
CO2: 23 mmol/L (ref 22–32)
CREATININE: 1.4 mg/dL — AB (ref 0.61–1.24)
Calcium: 8.8 mg/dL — ABNORMAL LOW (ref 8.9–10.3)
Chloride: 90 mmol/L — ABNORMAL LOW (ref 101–111)
GFR calc non Af Amer: 55 mL/min — ABNORMAL LOW (ref >60)
Glucose, Bld: 1082 mg/dL (ref 65–99)
Potassium: 4.6 mmol/L (ref 3.5–5.1)
Sodium: 126 mmol/L — ABNORMAL LOW (ref 135–145)

## 2017-05-07 LAB — CBG MONITORING, ED: Glucose-Capillary: >600 mg/dL (ref 65–99)

## 2017-05-07 MED ORDER — SODIUM CHLORIDE 0.9 % IV BOLUS
1000.0000 mL | Freq: Once | INTRAVENOUS | Status: AC
  Administered 2017-05-08: 1000 mL via INTRAVENOUS

## 2017-05-07 MED ORDER — SODIUM CHLORIDE 0.9 % IV SOLN
INTRAVENOUS | Status: DC
  Administered 2017-05-08: 5.4 [IU]/h via INTRAVENOUS
  Filled 2017-05-07: qty 1

## 2017-05-07 MED ORDER — SODIUM CHLORIDE 0.9 % IV SOLN: INTRAVENOUS | Status: DC

## 2017-05-07 NOTE — ED Triage Notes (Signed)
BIB EMS from [facility] reporting hyperglycemia. Asymptomatic.

## 2017-05-08 LAB — CBG MONITORING, ED
GLUCOSE-CAPILLARY: 448 mg/dL — AB (ref 65–99)
GLUCOSE-CAPILLARY: 281 mg/dL — AB (ref 65–99)

## 2017-05-08 NOTE — Discharge Instructions (Addendum)
[facility] today. Recheck as needed.

## 2017-05-08 NOTE — ED Provider Notes (Signed)
[place] EMERGENCY DEPARTMENT Provider Note   CSN: [id number] Arrival date & time: 05/07/17  1926  Time seen 12:05 AM    History   Chief Complaint Chief Complaint  Patient presents with  . Hyperglycemia   Level 5 Caveat due to Altered Mental Status  HPI Mike [name] is a 57 y.o. male.  HPI patient is a frequent ED visitor for hyperglycemia and noncompliance.  He initially told me he did not go to the [facility] today for his daily insulin injection and then he stated he went the nurse was not there.  He was just discharged from the ED yesterday around 3:30 PM.  He states he had not eaten anything today.  He denies nausea, vomiting, feeling dizzy or lightheaded.  PCP [name], Audrea Muscat, NP   Past Medical History:  Diagnosis Date  . Anxiety   . Anxiety   . Chronic lower back pain   . Depression   . DKA (diabetic ketoacidoses) ([place]) 07/30/2016  . Hyperlipemia   . Hypertension   . Migraine    "last one was ~ 4 yr ago" (12/23/2014)  . Seizures ([place])    "related to pills for anxiety; if I don't take the pills I'm suppose to take I'll have them" (12/23/2014)  . Type II diabetes mellitus [facility])     Patient Active Problem List   Diagnosis Date Noted  . AKI (acute kidney injury) ([place]) 03/04/2017  . Malnutrition of moderate degree 02/08/2017  . DKA (diabetic ketoacidoses) ([place]) 02/07/2017  . History of stroke 02/07/2017  . Hyperphosphatemia 12/13/2016  . Evaluation by psychiatric service required 11/30/2016  . Lactic acidosis 11/18/2016  . Normocytic anemia 11/06/2016  . HLD (hyperlipidemia) 11/06/2016  . Adjustment disorder with other symptoms 11/05/2016  . Uncontrolled type 2 diabetes mellitus with hyperosmolar nonketotic hyperglycemia ([place]) 08/24/2016  . Essential hypertension 08/24/2016  . Acute ischemic stroke ([place])   . Protein-calorie malnutrition, severe 12/24/2014  . Homelessness 12/23/2014  . Hypertensive urgency 12/23/2014  . DM (diabetes  mellitus) ([place]) 12/23/2014  . Intermittent palpitations 12/23/2014  . Tobacco abuse 12/23/2014  . Pleuritic chest pain 12/23/2014  . Abnormal EKG 12/23/2014  . Type II diabetes mellitus with renal manifestations [facility])     Past Surgical History:  Procedure Laterality Date  . NO PAST SURGERIES          Home Medications    Prior to Admission medications   Medication Sig Start Date End Date Taking? Authorizing Provider  aspirin 325 MG tablet Take 1 tablet (325 mg total) by mouth daily. 03/08/17   Georgette Shell, MD  atorvastatin (LIPITOR) 40 MG tablet Take 1 tablet (40 mg total) by mouth daily. 01/26/17   Cardama, Grayce Sessions, MD  blood glucose meter kit and supplies KIT Dispense based on patient and insurance preference. Use up to four times daily as directed. (FOR ICD-9 250.00, 250.01). 11/22/16   Velvet Bathe, MD  ferrous sulfate 325 (65 FE) MG tablet Take 1 tablet (325 mg total) daily by mouth. Patient taking differently: Take 325 mg by mouth daily as needed (low iron).  11/23/16   Ward, Delice Bison, DO  insulin aspart protamine- aspart (NOVOLOG MIX 70/30) (70-30) 100 UNIT/ML injection Inject 0.3 mLs (30 Units total) into the skin 2 (two) times daily with a meal. 04/30/17   Dorie Rank, MD  lisinopril (PRINIVIL,ZESTRIL) 40 MG tablet Take 1 tablet (40 mg total) by mouth daily. 04/30/17   Dorie Rank, MD  metoprolol tartrate (LOPRESSOR) 25  MG tablet Take 0.5 tablets (12.5 mg total) by mouth 2 (two) times daily. 03/07/17   Georgette Shell, MD    Family History Family History  Problem Relation Age of Onset  . Diabetes Mellitus II Mother   . Diabetes Mellitus II Father     Social History Social History   Tobacco Use  . Smoking status: Current Every Day Smoker    Packs/day: 0.10    Years: 40.00    Pack years: 4.00    Types: Cigarettes  . Smokeless tobacco: Never Used  Substance Use Topics  . Alcohol use: Yes    Alcohol/week: 1.2 oz    Types: 2 Cans of beer per week  .  Drug use: No    Comment: 12/23/2014 "stopped ~ 10 yrs ago"     Allergies   Ibuprofen and Tylenol [acetaminophen]   Review of Systems Review of Systems  All other systems reviewed and are negative.    Physical Exam Updated Vital Signs BP (!) 178/99 (BP Location: Left Arm)   Pulse 95   Temp 98.8 F (37.1 C) (Oral)   Resp 18   Ht 5' 8" (1.727 m)   Wt 77.1 kg (170 lb)   SpO2 93%   BMI 25.85 kg/m   Physical Exam  Constitutional: He appears well-developed and well-nourished.  Non-toxic appearance. He does not appear ill. No distress.  Ill kept male   HENT:  Head: Normocephalic and atraumatic.  Right Ear: External ear normal.  Left Ear: External ear normal.  Nose: Nose normal. No mucosal edema or rhinorrhea.  Mouth/Throat: Mucous membranes are normal. No dental abscesses or uvula swelling.  Tongue dry  Eyes: Pupils are equal, round, and reactive to light. Conjunctivae and EOM are normal.  Neck: Normal range of motion and full passive range of motion without pain. Neck supple.  Cardiovascular: Normal rate, regular rhythm and normal heart sounds. Exam reveals no gallop and no friction rub.  No murmur heard. Pulmonary/Chest: Effort normal and breath sounds normal. No respiratory distress. He has no wheezes. He has no rhonchi. He has no rales. He exhibits no tenderness and no crepitus.  Abdominal: Soft. Normal appearance and bowel sounds are normal. He exhibits no distension. There is no tenderness. There is no rebound and no guarding.  Musculoskeletal: Normal range of motion. He exhibits edema. He exhibits no tenderness.  Moves all extremities well.   Neurological: He has normal strength. No cranial nerve deficit.  Slow mentation  Skin: Skin is warm, dry and intact. No rash noted. No erythema. No pallor.  Psychiatric: His speech is delayed. He is slowed.  Flat affect  Nursing note and vitals reviewed.    ED Treatments / Results  Labs (all labs ordered are listed, but  only abnormal results are displayed) Results for orders placed or performed during the hospital encounter of 27/07/86  Basic metabolic panel  Result Value Ref Range   Sodium 126 (L) 135 - 145 mmol/L   Potassium 4.6 3.5 - 5.1 mmol/L   Chloride 90 (L) 101 - 111 mmol/L   CO2 23 22 - 32 mmol/L   Glucose, Bld 1,082 (HH) 65 - 99 mg/dL   BUN 18 6 - 20 mg/dL   Creatinine, Ser 1.40 (H) 0.61 - 1.24 mg/dL   Calcium 8.8 (L) 8.9 - 10.3 mg/dL   GFR calc non Af Amer 55 (L) >60 mL/min   GFR calc Af Amer >60 >60 mL/min   Anion gap 13 5 - 15  CBC  Result Value Ref Range   WBC 5.1 4.0 - 10.5 K/uL   RBC 4.21 (L) 4.22 - 5.81 MIL/uL   Hemoglobin 10.8 (L) 13.0 - 17.0 g/dL   HCT 33.9 (L) 39.0 - 52.0 %   MCV 80.5 78.0 - 100.0 fL   MCH 25.7 (L) 26.0 - 34.0 pg   MCHC 31.9 30.0 - 36.0 g/dL   RDW 13.8 11.5 - 15.5 %   Platelets 322 150 - 400 K/uL  Urinalysis, Routine w reflex microscopic  Result Value Ref Range   Color, Urine STRAW (A) YELLOW   APPearance CLEAR CLEAR   Specific Gravity, Urine 1.027 1.005 - 1.030   pH 6.0 5.0 - 8.0   Glucose, UA >=500 (A) NEGATIVE mg/dL   Hgb urine dipstick NEGATIVE NEGATIVE   Bilirubin Urine NEGATIVE NEGATIVE   Ketones, ur NEGATIVE NEGATIVE mg/dL   Protein, ur NEGATIVE NEGATIVE mg/dL   Nitrite NEGATIVE NEGATIVE   Leukocytes, UA NEGATIVE NEGATIVE   RBC / HPF 0-5 0 - 5 RBC/hpf   WBC, UA 0-5 0 - 5 WBC/hpf   Bacteria, UA NONE SEEN NONE SEEN   Squamous Epithelial / LPF 0-5 (A) NONE SEEN   Mucus PRESENT   CBG monitoring, ED  Result Value Ref Range   Glucose-Capillary >600 (HH) 65 - 99 mg/dL  CBG monitoring, ED  Result Value Ref Range   Glucose-Capillary >600 (HH) 65 - 99 mg/dL  CBG monitoring, ED  Result Value Ref Range   Glucose-Capillary 448 (H) 65 - 99 mg/dL   Laboratory interpretation all normal except hyperglycemia without metabolic acidosis, renal insufficiency    EKG None  Radiology No results found.  Procedures Procedures (including critical care  time)  Medications Ordered in ED Medications  insulin regular (NOVOLIN R,HUMULIN R) 100 Units in sodium chloride 0.9 % 100 mL (1 Units/mL) infusion (3.9 Units/hr Intravenous Rate/Dose Change 05/08/17 0238)  sodium chloride 0.9 % bolus 1,000 mL (0 mLs Intravenous Stopped 05/08/17 0222)    And  sodium chloride 0.9 % bolus 1,000 mL (0 mLs Intravenous Stopped 05/08/17 0222)    And  0.9 %  sodium chloride infusion (has no administration in time range)     Initial Impression / Assessment and Plan / ED Course  I have reviewed the triage vital signs and the nursing notes.  Pertinent labs & imaging results that were available during my care of the patient were reviewed by me and considered in my medical decision making (see chart for details).     Pt was started on IV fluids and IV insulin  Patient was on the insulin drip for short period of time and his blood sugar improved to 448.  He was discharged.  Final Clinical Impressions(s) / ED Diagnoses   Final diagnoses:  Hyperglycemia  Noncompliance with medications    ED Discharge Orders    None     Plan discharge  Rolland Porter, MD, Barbette Or, MD 05/08/17 [phone]

## 2017-05-08 NOTE — ED Notes (Signed)
PT states understanding of care given, follow up care. PT ambulated from ED to car with a steady gait.  

## 2017-05-08 NOTE — ED Notes (Signed)
Signature pad not working. 

## 2017-05-09 DIAGNOSIS — Z5321 Procedure and treatment not carried out due to patient leaving prior to being seen by health care provider: Secondary | ICD-10-CM | POA: Insufficient documentation

## 2017-05-09 NOTE — ED Triage Notes (Signed)
Per EMS- patient c/o hyperglycemia.Marland Kitchen. EMS creported that their CBG read high(>700)/ Patient Alert and oriented to baseline.

## 2017-05-10 ENCOUNTER — Emergency Department (HOSPITAL_COMMUNITY)
Admission: EM | Admit: 2017-05-10 | Discharge: 2017-05-10 | Disposition: A | Discharge status: Home / Self Care | Payer: Self-pay | Attending: Emergency Medicine | Admitting: Emergency Medicine

## 2017-05-10 ENCOUNTER — Emergency Department (HOSPITAL_COMMUNITY)
Admission: EM | Admit: 2017-05-10 | Discharge: 2017-05-10 | Discharge status: Against Medical Advice | Payer: Self-pay | Attending: Emergency Medicine | Admitting: Emergency Medicine

## 2017-05-10 ENCOUNTER — Encounter (HOSPITAL_COMMUNITY): Payer: Self-pay | Admitting: Emergency Medicine

## 2017-05-10 DIAGNOSIS — I10 Essential (primary) hypertension: Secondary | ICD-10-CM | POA: Insufficient documentation

## 2017-05-10 DIAGNOSIS — R739 Hyperglycemia, unspecified: Secondary | ICD-10-CM

## 2017-05-10 DIAGNOSIS — Z794 Long term (current) use of insulin: Secondary | ICD-10-CM | POA: Insufficient documentation

## 2017-05-10 DIAGNOSIS — F17210 Nicotine dependence, cigarettes, uncomplicated: Secondary | ICD-10-CM | POA: Insufficient documentation

## 2017-05-10 DIAGNOSIS — E1165 Type 2 diabetes mellitus with hyperglycemia: Secondary | ICD-10-CM | POA: Insufficient documentation

## 2017-05-10 LAB — CBG MONITORING, ED: GLUCOSE-CAPILLARY: 195 mg/dL — AB (ref 65–99)

## 2017-05-10 NOTE — ED Notes (Signed)
Pt never returned to the lobby

## 2017-05-10 NOTE — ED Triage Notes (Signed)
Pt called for triage  No response from lobby  Pt belongings in lobby but unable to locate pt at this time

## 2017-05-10 NOTE — ED Triage Notes (Signed)
Per [facility] pt [place] [facility] for thinking his sugar was high.  CBG 256 Vitals: 132/82, 94HR, 96%

## 2017-05-10 NOTE — ED Notes (Signed)
Bed: WLPT3 Expected date:  Expected time:  Means of arrival:  Comments: 

## 2017-05-10 NOTE — ED Notes (Signed)
Pt given diet soda with discharge instructions.

## 2017-05-10 NOTE — ED Notes (Signed)
Called pt for triage  No response  Pt not in lobby

## 2017-05-10 NOTE — ED Notes (Signed)
Pt still not in lobby.

## 2017-05-11 NOTE — ED Provider Notes (Signed)
[place] DEPT Provider Note   CSN: [id number] Arrival date & time: 05/10/17  1709     History   Chief Complaint Chief Complaint  Patient presents with  . Hyperglycemia    HPI Mike [name] is a 57 y.o. male.  HPI Patient brought in by EMS for hyperglycemia.  CBG by EMS 256.  Upon arrival was under 200.  Patient is really without complaints but asks for a blanket.  He has had 67 visits in the last 6 months.  Does have a history of noncompliance. Past Medical History:  Diagnosis Date  . Anxiety   . Anxiety   . Chronic lower back pain   . Depression   . DKA (diabetic ketoacidoses) ([place]) 07/30/2016  . Hyperlipemia   . Hypertension   . Migraine    "last one was ~ 4 yr ago" (12/23/2014)  . Seizures ([place])    "related to pills for anxiety; if I don't take the pills I'm suppose to take I'll have them" (12/23/2014)  . Type II diabetes mellitus [facility])     Patient Active Problem List   Diagnosis Date Noted  . AKI (acute kidney injury) ([place]) 03/04/2017  . Malnutrition of moderate degree 02/08/2017  . DKA (diabetic ketoacidoses) ([place]) 02/07/2017  . History of stroke 02/07/2017  . Hyperphosphatemia 12/13/2016  . Evaluation by psychiatric service required 11/30/2016  . Lactic acidosis 11/18/2016  . Normocytic anemia 11/06/2016  . HLD (hyperlipidemia) 11/06/2016  . Adjustment disorder with other symptoms 11/05/2016  . Uncontrolled type 2 diabetes mellitus with hyperosmolar nonketotic hyperglycemia ([place]) 08/24/2016  . Essential hypertension 08/24/2016  . Acute ischemic stroke ([place])   . Protein-calorie malnutrition, severe 12/24/2014  . Homelessness 12/23/2014  . Hypertensive urgency 12/23/2014  . DM (diabetes mellitus) ([place]) 12/23/2014  . Intermittent palpitations 12/23/2014  . Tobacco abuse 12/23/2014  . Pleuritic chest pain 12/23/2014  . Abnormal EKG 12/23/2014  . Type II diabetes mellitus with renal manifestations [facility])     Past  Surgical History:  Procedure Laterality Date  . NO PAST SURGERIES          Home Medications    Prior to Admission medications   Medication Sig Start Date End Date Taking? Authorizing Provider  aspirin 325 MG tablet Take 1 tablet (325 mg total) by mouth daily. 03/08/17   Georgette Shell, MD  atorvastatin (LIPITOR) 40 MG tablet Take 1 tablet (40 mg total) by mouth daily. 01/26/17   Cardama, Grayce Sessions, MD  blood glucose meter kit and supplies KIT Dispense based on patient and insurance preference. Use up to four times daily as directed. (FOR ICD-9 250.00, 250.01). 11/22/16   Velvet Bathe, MD  ferrous sulfate 325 (65 FE) MG tablet Take 1 tablet (325 mg total) daily by mouth. Patient taking differently: Take 325 mg by mouth daily as needed (low iron).  11/23/16   Ward, Delice Bison, DO  insulin aspart protamine- aspart (NOVOLOG MIX 70/30) (70-30) 100 UNIT/ML injection Inject 0.3 mLs (30 Units total) into the skin 2 (two) times daily with a meal. 04/30/17   Dorie Rank, MD  lisinopril (PRINIVIL,ZESTRIL) 40 MG tablet Take 1 tablet (40 mg total) by mouth daily. 04/30/17   Dorie Rank, MD  metoprolol tartrate (LOPRESSOR) 25 MG tablet Take 0.5 tablets (12.5 mg total) by mouth 2 (two) times daily. 03/07/17   Georgette Shell, MD    Family History Family History  Problem Relation Age of Onset  . Diabetes Mellitus II Mother   .  Diabetes Mellitus II Father     Social History Social History   Tobacco Use  . Smoking status: Current Every Day Smoker    Packs/day: 0.10    Years: 40.00    Pack years: 4.00    Types: Cigarettes  . Smokeless tobacco: Never Used  Substance Use Topics  . Alcohol use: Yes    Alcohol/week: 1.2 oz    Types: 2 Cans of beer per week  . Drug use: No    Comment: 12/23/2014 "stopped ~ 10 yrs ago"     Allergies   Ibuprofen and Tylenol [acetaminophen]   Review of Systems Review of Systems  Constitutional: Negative for appetite change.  Respiratory: Negative for  shortness of breath.   Gastrointestinal: Negative for abdominal pain.  Endocrine: Negative for polyuria.     Physical Exam Updated Vital Signs BP 132/83 (BP Location: Left Arm)   Pulse 92   Temp 98.6 F (37 C) (Oral)   Resp 18   SpO2 99%   Physical Exam  Constitutional: He appears well-developed.  Patient is somewhat disheveled  HENT:  Head: Atraumatic.  Cardiovascular: Normal rate.  Neurological: He is alert.  Skin: Skin is warm. Capillary refill takes less than 2 seconds.     ED Treatments / Results  Labs (all labs ordered are listed, but only abnormal results are displayed) Labs Reviewed  CBG MONITORING, ED - Abnormal; Notable for the following components:      Result Value   Glucose-Capillary 195 (*)    All other components within normal limits    EKG None  Radiology No results found.  Procedures Procedures (including critical care time)  Medications Ordered in ED Medications - No data to display   Initial Impression / Assessment and Plan / ED Course  I have reviewed the triage vital signs and the nursing notes.  Pertinent labs & imaging results that were available during my care of the patient were reviewed by me and considered in my medical decision making (see chart for details).     Patient with hyperglycemia.  Mild.  Does not appear to need acute intervention for this.  Discharge.  Final Clinical Impressions(s) / ED Diagnoses   Final diagnoses:  Hyperglycemia    ED Discharge Orders    None       Davonna Belling, MD 05/11/17 [phone]

## 2017-05-12 ENCOUNTER — Emergency Department (HOSPITAL_COMMUNITY)
Admission: EM | Admit: 2017-05-12 | Discharge: 2017-05-13 | Disposition: A | Discharge status: Home / Self Care | Payer: Self-pay | Attending: Emergency Medicine | Admitting: Emergency Medicine

## 2017-05-12 ENCOUNTER — Other Ambulatory Visit: Payer: Self-pay

## 2017-05-12 ENCOUNTER — Encounter (HOSPITAL_COMMUNITY): Payer: Self-pay | Admitting: Emergency Medicine

## 2017-05-12 ENCOUNTER — Emergency Department (HOSPITAL_COMMUNITY)
Admission: EM | Admit: 2017-05-12 | Discharge: 2017-05-12 | Disposition: A | Discharge status: Home / Self Care | Payer: Self-pay | Attending: Emergency Medicine | Admitting: Emergency Medicine

## 2017-05-12 DIAGNOSIS — F17210 Nicotine dependence, cigarettes, uncomplicated: Secondary | ICD-10-CM | POA: Insufficient documentation

## 2017-05-12 DIAGNOSIS — Z7982 Long term (current) use of aspirin: Secondary | ICD-10-CM | POA: Insufficient documentation

## 2017-05-12 DIAGNOSIS — Z590 Homelessness: Secondary | ICD-10-CM | POA: Insufficient documentation

## 2017-05-12 DIAGNOSIS — E1165 Type 2 diabetes mellitus with hyperglycemia: Secondary | ICD-10-CM | POA: Insufficient documentation

## 2017-05-12 DIAGNOSIS — Z79899 Other long term (current) drug therapy: Secondary | ICD-10-CM | POA: Insufficient documentation

## 2017-05-12 DIAGNOSIS — Z794 Long term (current) use of insulin: Secondary | ICD-10-CM | POA: Insufficient documentation

## 2017-05-12 DIAGNOSIS — R739 Hyperglycemia, unspecified: Secondary | ICD-10-CM

## 2017-05-12 DIAGNOSIS — E1110 Type 2 diabetes mellitus with ketoacidosis without coma: Secondary | ICD-10-CM | POA: Insufficient documentation

## 2017-05-12 DIAGNOSIS — I10 Essential (primary) hypertension: Secondary | ICD-10-CM | POA: Insufficient documentation

## 2017-05-12 DIAGNOSIS — Z8673 Personal history of transient ischemic attack (TIA), and cerebral infarction without residual deficits: Secondary | ICD-10-CM | POA: Insufficient documentation

## 2017-05-12 LAB — BASIC METABOLIC PANEL
ANION GAP: 7 (ref 5–15)
BUN: 18 mg/dL (ref 6–20)
CALCIUM: 9 mg/dL (ref 8.9–10.3)
CO2: 28 mmol/L (ref 22–32)
Chloride: 98 mmol/L — ABNORMAL LOW (ref 101–111)
Creatinine, Ser: 1.02 mg/dL (ref 0.61–1.24)
Glucose, Bld: 805 mg/dL (ref 65–99)
Potassium: 4.3 mmol/L (ref 3.5–5.1)
Sodium: 133 mmol/L — ABNORMAL LOW (ref 135–145)

## 2017-05-12 LAB — I-STAT CHEM 8, ED
BUN: 21 mg/dL — ABNORMAL HIGH (ref 6–20)
CREATININE: 0.9 mg/dL (ref 0.61–1.24)
Calcium, Ion: 1.18 mmol/L (ref 1.15–1.40)
Chloride: 95 mmol/L — ABNORMAL LOW (ref 101–111)
Glucose, Bld: 472 mg/dL — ABNORMAL HIGH (ref 65–99)
HEMATOCRIT: 33 % — AB (ref 39.0–52.0)
HEMOGLOBIN: 11.2 g/dL — AB (ref 13.0–17.0)
POTASSIUM: 3.7 mmol/L (ref 3.5–5.1)
Sodium: 136 mmol/L (ref 135–145)
TCO2: 31 mmol/L (ref 22–32)

## 2017-05-12 LAB — URINALYSIS, ROUTINE W REFLEX MICROSCOPIC
Bilirubin Urine: NEGATIVE
Glucose, UA: >=500 mg/dL — AB
HGB URINE DIPSTICK: NEGATIVE
Ketones, ur: NEGATIVE mg/dL
Nitrite: NEGATIVE
Protein, ur: NEGATIVE mg/dL
SPECIFIC GRAVITY, URINE: 1.023 (ref 1.005–1.030)
pH: 7 (ref 5.0–8.0)

## 2017-05-12 LAB — CBG MONITORING, ED
GLUCOSE-CAPILLARY: 459 mg/dL — AB (ref 65–99)
GLUCOSE-CAPILLARY: 381 mg/dL — AB (ref 65–99)

## 2017-05-12 MED ORDER — SODIUM CHLORIDE 0.9 % IV BOLUS
1000.0000 mL | Freq: Once | INTRAVENOUS | Status: AC
  Administered 2017-05-12: 1000 mL via INTRAVENOUS

## 2017-05-12 MED ORDER — INSULIN ASPART 100 UNIT/ML ~~LOC~~ SOLN
10.0000 [IU] | Freq: Once | SUBCUTANEOUS | Status: AC
  Administered 2017-05-12: 10 [IU] via SUBCUTANEOUS
  Filled 2017-05-12: qty 1

## 2017-05-12 MED ORDER — INSULIN ASPART 100 UNIT/ML IV SOLN
15.0000 [IU] | Freq: Once | INTRAVENOUS | Status: AC
  Administered 2017-05-13: 15 [IU] via INTRAVENOUS

## 2017-05-12 NOTE — Discharge Instructions (Addendum)
Continue your medications as previously prescribed. °

## 2017-05-12 NOTE — ED Triage Notes (Signed)
Pt arrives via [facility] for hyperglycemia; called in by bystanders reporting low blood sugar, EMS read high. Oriented x2 (disortiened to time and sitauation)  167/84; 14 RR; NSR 86; 100% RA CBG HIGH x2 Per pt, went to [facility] for elevated BP today. 20 L FA

## 2017-05-12 NOTE — ED Provider Notes (Signed)
[facility] EMERGENCY DEPARTMENT Provider Note   CSN: [id number] Arrival date & time: 05/12/17  2149     History   Chief Complaint Chief Complaint  Patient presents with  . Hyperglycemia    HPI Mike [name] is a 57 y.o. male.  Patient reports he did not get his insulin today because it is usually administered to him by somebody from the [facility].  He is asymptomatic.  He presents requesting insulin.  Treatment prior to coming here.  Denies pain anywhere.  HPI  Past Medical History:  Diagnosis Date  . Anxiety   . Anxiety   . Chronic lower back pain   . Depression   . DKA (diabetic ketoacidoses) ([place]) 07/30/2016  . Hyperlipemia   . Hypertension   . Migraine    "last one was ~ 4 yr ago" (12/23/2014)  . Seizures ([place])    "related to pills for anxiety; if I don't take the pills I'm suppose to take I'll have them" (12/23/2014)  . Type II diabetes mellitus [facility])     Patient Active Problem List   Diagnosis Date Noted  . AKI (acute kidney injury) ([place]) 03/04/2017  . Malnutrition of moderate degree 02/08/2017  . DKA (diabetic ketoacidoses) ([place]) 02/07/2017  . History of stroke 02/07/2017  . Hyperphosphatemia 12/13/2016  . Evaluation by psychiatric service required 11/30/2016  . Lactic acidosis 11/18/2016  . Normocytic anemia 11/06/2016  . HLD (hyperlipidemia) 11/06/2016  . Adjustment disorder with other symptoms 11/05/2016  . Uncontrolled type 2 diabetes mellitus with hyperosmolar nonketotic hyperglycemia ([place]) 08/24/2016  . Essential hypertension 08/24/2016  . Acute ischemic stroke ([place])   . Protein-calorie malnutrition, severe 12/24/2014  . Homelessness 12/23/2014  . Hypertensive urgency 12/23/2014  . DM (diabetes mellitus) ([place]) 12/23/2014  . Intermittent palpitations 12/23/2014  . Tobacco abuse 12/23/2014  . Pleuritic chest pain 12/23/2014  . Abnormal EKG 12/23/2014  . Type II diabetes mellitus with renal manifestations [facility])     Past Surgical  History:  Procedure Laterality Date  . NO PAST SURGERIES          Home Medications    Prior to Admission medications   Medication Sig Start Date End Date Taking? Authorizing Provider  aspirin 325 MG tablet Take 1 tablet (325 mg total) by mouth daily. 03/08/17   Georgette Shell, MD  atorvastatin (LIPITOR) 40 MG tablet Take 1 tablet (40 mg total) by mouth daily. 01/26/17   Cardama, Grayce Sessions, MD  blood glucose meter kit and supplies KIT Dispense based on patient and insurance preference. Use up to four times daily as directed. (FOR ICD-9 250.00, 250.01). 11/22/16   Velvet Bathe, MD  ferrous sulfate 325 (65 FE) MG tablet Take 1 tablet (325 mg total) daily by mouth. Patient taking differently: Take 325 mg by mouth daily as needed (low iron).  11/23/16   Ward, Delice Bison, DO  insulin aspart protamine- aspart (NOVOLOG MIX 70/30) (70-30) 100 UNIT/ML injection Inject 0.3 mLs (30 Units total) into the skin 2 (two) times daily with a meal. 04/30/17   Dorie Rank, MD  lisinopril (PRINIVIL,ZESTRIL) 40 MG tablet Take 1 tablet (40 mg total) by mouth daily. 04/30/17   Dorie Rank, MD  metoprolol tartrate (LOPRESSOR) 25 MG tablet Take 0.5 tablets (12.5 mg total) by mouth 2 (two) times daily. 03/07/17   Georgette Shell, MD    Family History Family History  Problem Relation Age of Onset  . Diabetes Mellitus II Mother   . Diabetes Mellitus II Father  Social History Social History   Tobacco Use  . Smoking status: Current Every Day Smoker    Packs/day: 0.10    Years: 40.00    Pack years: 4.00    Types: Cigarettes  . Smokeless tobacco: Never Used  Substance Use Topics  . Alcohol use: Yes    Alcohol/week: 1.2 oz    Types: 2 Cans of beer per week  . Drug use: No    Comment: 12/23/2014 "stopped ~ 10 yrs ago"   Denies tobacco alcohol or drugs and homeless.  Allergies   Ibuprofen and Tylenol [acetaminophen]   Review of Systems Review of Systems  Constitutional: Negative.   HENT:  Negative.   Respiratory: Negative.   Cardiovascular: Negative.   Gastrointestinal: Negative.   Musculoskeletal: Negative.   Skin: Negative.   Allergic/Immunologic: Positive for immunocompromised state.       Diabetic  Neurological: Negative.   Psychiatric/Behavioral: Negative.   All other systems reviewed and are negative.    Physical Exam Updated Vital Signs Ht _0  (1.727 m)   Wt 77.1 kg (170 lb)   BMI 25.85 kg/m   Physical Exam  Constitutional:  Unkempt  HENT:  Head: Normocephalic and atraumatic.  Eyes: Pupils are equal, round, and reactive to light. Conjunctivae are normal.  Neck: Neck supple. No tracheal deviation present. No thyromegaly present.  Cardiovascular: Normal rate and regular rhythm.  No murmur heard. Pulmonary/Chest: Effort normal and breath sounds normal.  Abdominal: Soft. Bowel sounds are normal. He exhibits no distension. There is no tenderness.  Genitourinary: Penis normal.  Genitourinary Comments: Uncircumcised perineum normal  Musculoskeletal: Normal range of motion. He exhibits edema. He exhibits no tenderness.  1+ pretibial pitting edema bilaterally  Neurological: He is alert. No cranial nerve deficit.  Oriented to name and hospital does not know date.  Cranial nerves II through XII grossly intact.  Skin: Skin is warm and dry. No rash noted.  Psychiatric:  Flat affect  Nursing note and vitals reviewed.    ED Treatments / Results  Labs (all labs ordered are listed, but only abnormal results are displayed) Labs Reviewed  BASIC METABOLIC PANEL    EKG None  Radiology No results found.  Procedures Procedures (including critical care time)  Medications Ordered in ED Medications  sodium chloride 0.9 % bolus 1,000 mL (has no administration in time range)     Initial Impression / Assessment and Plan / ED Course  I have reviewed the triage vital signs and the nursing notes.  Pertinent labs & imaging results that were available  during my care of the patient were reviewed by me and considered in my medical decision making (see chart for details).     Lab work consistent with hyperglycemia.  No evidence of diabetic ketoacidosis.  Intravenous hydration and subcutaneous insulin ordered.  Patient signed out to Dr Stark Jock at 12 midnight  Final Clinical Impressions(s) / ED Diagnoses  Diagnosis hyperglycemia Final diagnoses:  None    ED Discharge Orders    None       Orlie Dakin, MD 05/13/17 [phone]

## 2017-05-12 NOTE — ED Notes (Signed)
Patient stood up and urinated in the middle of the hallway. Patient directed to bathroom previously.

## 2017-05-12 NOTE — ED Provider Notes (Signed)
[place] DEPT Provider Note   CSN: [id number] Arrival date & time: 05/12/17  0106     History   Chief Complaint Chief Complaint  Patient presents with  . Hyperglycemia    HPI Mike [name] is a 57 y.o. male.  Patient is a 57 year old male with history of diabetes, hypertension, and homelessness.  He presents today for evaluation of elevated blood sugar.  He has no other complaints.  Patient is well-known to the emergency department as he frequents the ER with similar complaints.  The history is provided by the patient.  Hyperglycemia  Blood sugar level PTA:  473 Severity:  Moderate Timing:  Constant Progression:  Unchanged Chronicity:  Chronic Relieved by:  Nothing   Past Medical History:  Diagnosis Date  . Anxiety   . Anxiety   . Chronic lower back pain   . Depression   . DKA (diabetic ketoacidoses) ([place]) 07/30/2016  . Hyperlipemia   . Hypertension   . Migraine    "last one was ~ 4 yr ago" (12/23/2014)  . Seizures ([place])    "related to pills for anxiety; if I don't take the pills I'm suppose to take I'll have them" (12/23/2014)  . Type II diabetes mellitus [facility])     Patient Active Problem List   Diagnosis Date Noted  . AKI (acute kidney injury) ([place]) 03/04/2017  . Malnutrition of moderate degree 02/08/2017  . DKA (diabetic ketoacidoses) ([place]) 02/07/2017  . History of stroke 02/07/2017  . Hyperphosphatemia 12/13/2016  . Evaluation by psychiatric service required 11/30/2016  . Lactic acidosis 11/18/2016  . Normocytic anemia 11/06/2016  . HLD (hyperlipidemia) 11/06/2016  . Adjustment disorder with other symptoms 11/05/2016  . Uncontrolled type 2 diabetes mellitus with hyperosmolar nonketotic hyperglycemia ([place]) 08/24/2016  . Essential hypertension 08/24/2016  . Acute ischemic stroke ([place])   . Protein-calorie malnutrition, severe 12/24/2014  . Homelessness 12/23/2014  . Hypertensive urgency 12/23/2014  . DM (diabetes  mellitus) ([place]) 12/23/2014  . Intermittent palpitations 12/23/2014  . Tobacco abuse 12/23/2014  . Pleuritic chest pain 12/23/2014  . Abnormal EKG 12/23/2014  . Type II diabetes mellitus with renal manifestations [facility])     Past Surgical History:  Procedure Laterality Date  . NO PAST SURGERIES          Home Medications    Prior to Admission medications   Medication Sig Start Date End Date Taking? Authorizing Provider  aspirin 325 MG tablet Take 1 tablet (325 mg total) by mouth daily. 03/08/17   Georgette Shell, MD  atorvastatin (LIPITOR) 40 MG tablet Take 1 tablet (40 mg total) by mouth daily. 01/26/17   Cardama, Grayce Sessions, MD  blood glucose meter kit and supplies KIT Dispense based on patient and insurance preference. Use up to four times daily as directed. (FOR ICD-9 250.00, 250.01). 11/22/16   Velvet Bathe, MD  ferrous sulfate 325 (65 FE) MG tablet Take 1 tablet (325 mg total) daily by mouth. Patient taking differently: Take 325 mg by mouth daily as needed (low iron).  11/23/16   Ward, Delice Bison, DO  insulin aspart protamine- aspart (NOVOLOG MIX 70/30) (70-30) 100 UNIT/ML injection Inject 0.3 mLs (30 Units total) into the skin 2 (two) times daily with a meal. 04/30/17   Dorie Rank, MD  lisinopril (PRINIVIL,ZESTRIL) 40 MG tablet Take 1 tablet (40 mg total) by mouth daily. 04/30/17   Dorie Rank, MD  metoprolol tartrate (LOPRESSOR) 25 MG tablet Take 0.5 tablets (12.5 mg total) by mouth 2 (two)  times daily. 03/07/17   Georgette Shell, MD    Family History Family History  Problem Relation Age of Onset  . Diabetes Mellitus II Mother   . Diabetes Mellitus II Father     Social History Social History   Tobacco Use  . Smoking status: Current Every Day Smoker    Packs/day: 0.10    Years: 40.00    Pack years: 4.00    Types: Cigarettes  . Smokeless tobacco: Never Used  Substance Use Topics  . Alcohol use: Yes    Alcohol/week: 1.2 oz    Types: 2 Cans of beer per week  .  Drug use: No    Comment: 12/23/2014 "stopped ~ 10 yrs ago"     Allergies   Ibuprofen and Tylenol [acetaminophen]   Review of Systems Review of Systems  All other systems reviewed and are negative.    Physical Exam Updated Vital Signs Ht _0  (1.727 m)   Wt 77.1 kg (170 lb)   BMI 25.85 kg/m   Physical Exam  Constitutional: He is oriented to person, place, and time. He appears well-developed and well-nourished. No distress.  HENT:  Head: Normocephalic and atraumatic.  Mouth/Throat: Oropharynx is clear and moist.  Neck: Normal range of motion. Neck supple.  Cardiovascular: Normal rate and regular rhythm. Exam reveals no friction rub.  No murmur heard. Pulmonary/Chest: Effort normal and breath sounds normal. No respiratory distress. He has no wheezes. He has no rales.  Abdominal: Soft. Bowel sounds are normal. He exhibits no distension. There is no tenderness.  Musculoskeletal: Normal range of motion. He exhibits no edema.  Neurological: He is alert and oriented to person, place, and time. Coordination normal.  Skin: Skin is warm and dry. He is not diaphoretic.  Nursing note and vitals reviewed.    ED Treatments / Results  Labs (all labs ordered are listed, but only abnormal results are displayed) Labs Reviewed  CBG MONITORING, ED - Abnormal; Notable for the following components:      Result Value   Glucose-Capillary 459 (*)    All other components within normal limits  I-STAT CHEM 8, ED - Abnormal; Notable for the following components:   Chloride 95 (*)    BUN 21 (*)    Glucose, Bld 472 (*)    Hemoglobin 11.2 (*)    HCT 33.0 (*)    All other components within normal limits  URINALYSIS, ROUTINE W REFLEX MICROSCOPIC    EKG None  Radiology No results found.  Procedures Procedures (including critical care time)  Medications Ordered in ED Medications  insulin aspart (novoLOG) injection 10 Units (has no administration in time range)     Initial  Impression / Assessment and Plan / ED Course  I have reviewed the triage vital signs and the nursing notes.  Pertinent labs & imaging results that were available during my care of the patient were reviewed by me and considered in my medical decision making (see chart for details).  Sugar initially 473 and improving after subcu insulin.  He is not in DKA.  He seems appropriate for discharge.  Final Clinical Impressions(s) / ED Diagnoses   Final diagnoses:  None    ED Discharge Orders    None       Veryl Speak, MD 05/12/17 [phone]

## 2017-05-12 NOTE — ED Triage Notes (Signed)
Patient brought in by [facility]. Patient complaining of his blood sugar being high.

## 2017-05-13 ENCOUNTER — Emergency Department (HOSPITAL_COMMUNITY)
Admission: EM | Admit: 2017-05-13 | Discharge: 2017-05-13 | Disposition: A | Discharge status: Home / Self Care | Payer: Self-pay | Attending: Emergency Medicine | Admitting: Emergency Medicine

## 2017-05-13 ENCOUNTER — Encounter (HOSPITAL_COMMUNITY): Payer: Self-pay | Admitting: Emergency Medicine

## 2017-05-13 DIAGNOSIS — Z794 Long term (current) use of insulin: Secondary | ICD-10-CM | POA: Insufficient documentation

## 2017-05-13 DIAGNOSIS — I10 Essential (primary) hypertension: Secondary | ICD-10-CM | POA: Insufficient documentation

## 2017-05-13 DIAGNOSIS — Z7982 Long term (current) use of aspirin: Secondary | ICD-10-CM | POA: Insufficient documentation

## 2017-05-13 DIAGNOSIS — E1165 Type 2 diabetes mellitus with hyperglycemia: Secondary | ICD-10-CM | POA: Insufficient documentation

## 2017-05-13 DIAGNOSIS — F17210 Nicotine dependence, cigarettes, uncomplicated: Secondary | ICD-10-CM | POA: Insufficient documentation

## 2017-05-13 DIAGNOSIS — Z79899 Other long term (current) drug therapy: Secondary | ICD-10-CM | POA: Insufficient documentation

## 2017-05-13 DIAGNOSIS — R739 Hyperglycemia, unspecified: Secondary | ICD-10-CM

## 2017-05-13 LAB — I-STAT CHEM 8, ED
BUN: 16 mg/dL (ref 6–20)
Calcium, Ion: 1.19 mmol/L (ref 1.15–1.40)
Chloride: 98 mmol/L — ABNORMAL LOW (ref 101–111)
Creatinine, Ser: 1.1 mg/dL (ref 0.61–1.24)
Glucose, Bld: >700 mg/dL (ref 65–99)
HCT: 33 % — ABNORMAL LOW (ref 39.0–52.0)
Hemoglobin: 11.2 g/dL — ABNORMAL LOW (ref 13.0–17.0)
Potassium: 4.4 mmol/L (ref 3.5–5.1)
Sodium: 135 mmol/L (ref 135–145)
TCO2: 27 mmol/L (ref 22–32)

## 2017-05-13 LAB — URINALYSIS, ROUTINE W REFLEX MICROSCOPIC
Bacteria, UA: NONE SEEN
Bilirubin Urine: NEGATIVE
Glucose, UA: >=500 mg/dL — AB
Hgb urine dipstick: NEGATIVE
Ketones, ur: NEGATIVE mg/dL
Leukocytes, UA: NEGATIVE
Nitrite: NEGATIVE
Protein, ur: NEGATIVE mg/dL
Specific Gravity, Urine: 1.018 (ref 1.005–1.030)
pH: 7 (ref 5.0–8.0)

## 2017-05-13 LAB — CBG MONITORING, ED
Glucose-Capillary: 397 mg/dL — ABNORMAL HIGH (ref 65–99)
Glucose-Capillary: 499 mg/dL — ABNORMAL HIGH (ref 65–99)

## 2017-05-13 MED ORDER — SODIUM CHLORIDE 0.9 % IV BOLUS
1000.0000 mL | Freq: Once | INTRAVENOUS | Status: AC
  Administered 2017-05-13: 1000 mL via INTRAVENOUS

## 2017-05-13 MED ORDER — INSULIN ASPART PROT & ASPART (70-30 MIX) 100 UNIT/ML ~~LOC~~ SUSP
10.0000 [IU] | Freq: Once | SUBCUTANEOUS | Status: AC
  Administered 2017-05-13: 10 [IU] via SUBCUTANEOUS
  Filled 2017-05-13: qty 10

## 2017-05-13 MED ORDER — INSULIN ASPART 100 UNIT/ML ~~LOC~~ SOLN
SUBCUTANEOUS | Status: AC
  Filled 2017-05-13: qty 1

## 2017-05-13 NOTE — ED Provider Notes (Signed)
[facility] DEPT Provider Note   CSN: [id number] Arrival date & time: 05/13/17  1808     History   Chief Complaint Chief Complaint  Patient presents with  . Hyperglycemia    HPI Mike [name] is a 57 y.o. male with history of poorly controlled diabetes, seizures, hyperlipidemia, hypertension, anxiety presents for evaluation of hyperglycemia.  He is well-known to this ED for this problem and has been seen for similar complaint 69 times in the past 6 months.  EMS was called as the patient was in [place] and had urinated on the floor.  He was reportedly drinking a drink from [place].  He denies drinking anything from [place] today.  He requests a blanket and denies any medical complaints.  Tells me he did have his insulin this morning but unable to tell me how much.   The history is provided by the patient.    Past Medical History:  Diagnosis Date  . Anxiety   . Anxiety   . Chronic lower back pain   . Depression   . DKA (diabetic ketoacidoses) ([place]) 07/30/2016  . Hyperlipemia   . Hypertension   . Migraine    "last one was ~ 4 yr ago" (12/23/2014)  . Seizures ([place])    "related to pills for anxiety; if I don't take the pills I'm suppose to take I'll have them" (12/23/2014)  . Type II diabetes mellitus [facility])     Patient Active Problem List   Diagnosis Date Noted  . AKI (acute kidney injury) ([place]) 03/04/2017  . Malnutrition of moderate degree 02/08/2017  . DKA (diabetic ketoacidoses) ([place]) 02/07/2017  . History of stroke 02/07/2017  . Hyperphosphatemia 12/13/2016  . Evaluation by psychiatric service required 11/30/2016  . Lactic acidosis 11/18/2016  . Normocytic anemia 11/06/2016  . HLD (hyperlipidemia) 11/06/2016  . Adjustment disorder with other symptoms 11/05/2016  . Uncontrolled type 2 diabetes mellitus with hyperosmolar nonketotic hyperglycemia ([place]) 08/24/2016  . Essential hypertension 08/24/2016  . Acute ischemic stroke ([place])     . Protein-calorie malnutrition, severe 12/24/2014  . Homelessness 12/23/2014  . Hypertensive urgency 12/23/2014  . DM (diabetes mellitus) ([place]) 12/23/2014  . Intermittent palpitations 12/23/2014  . Tobacco abuse 12/23/2014  . Pleuritic chest pain 12/23/2014  . Abnormal EKG 12/23/2014  . Type II diabetes mellitus with renal manifestations [facility])     Past Surgical History:  Procedure Laterality Date  . NO PAST SURGERIES          Home Medications    Prior to Admission medications   Medication Sig Start Date End Date Taking? Authorizing Provider  aspirin 325 MG tablet Take 1 tablet (325 mg total) by mouth daily. 03/08/17   Georgette Shell, MD  atorvastatin (LIPITOR) 40 MG tablet Take 1 tablet (40 mg total) by mouth daily. 01/26/17   Cardama, Grayce Sessions, MD  blood glucose meter kit and supplies KIT Dispense based on patient and insurance preference. Use up to four times daily as directed. (FOR ICD-9 250.00, 250.01). 11/22/16   Velvet Bathe, MD  ferrous sulfate 325 (65 FE) MG tablet Take 1 tablet (325 mg total) daily by mouth. Patient taking differently: Take 325 mg by mouth daily as needed (low iron).  11/23/16   Ward, Delice Bison, DO  insulin aspart protamine- aspart (NOVOLOG MIX 70/30) (70-30) 100 UNIT/ML injection Inject 0.3 mLs (30 Units total) into the skin 2 (two) times daily with a meal. 04/30/17   Dorie Rank, MD  lisinopril (PRINIVIL,ZESTRIL) 40 MG tablet  Take 1 tablet (40 mg total) by mouth daily. 04/30/17   Dorie Rank, MD  metoprolol tartrate (LOPRESSOR) 25 MG tablet Take 0.5 tablets (12.5 mg total) by mouth 2 (two) times daily. 03/07/17   Georgette Shell, MD    Family History Family History  Problem Relation Age of Onset  . Diabetes Mellitus II Mother   . Diabetes Mellitus II Father     Social History Social History   Tobacco Use  . Smoking status: Current Every Day Smoker    Packs/day: 0.10    Years: 40.00    Pack years: 4.00    Types: Cigarettes  .  Smokeless tobacco: Never Used  Substance Use Topics  . Alcohol use: Yes    Alcohol/week: 1.2 oz    Types: 2 Cans of beer per week  . Drug use: No    Comment: 12/23/2014 "stopped ~ 10 yrs ago"     Allergies   Ibuprofen and Tylenol [acetaminophen]   Review of Systems Review of Systems  Constitutional: Negative for chills and fever.  Endocrine: Positive for polyuria.  Neurological: Negative for syncope.  All other systems reviewed and are negative.    Physical Exam Updated Vital Signs BP (!) 161/101 (BP Location: Left Arm)   Pulse 80   Temp 97.8 F (36.6 C) (Oral)   Resp 16   SpO2 99%   Physical Exam  Constitutional: He appears well-developed and well-nourished. No distress.  Resting in chair wearing blue paper scrubs.  There appears to be urine on the floor.  HENT:  Head: Normocephalic and atraumatic.  Eyes: Conjunctivae are normal. Right eye exhibits no discharge. Left eye exhibits no discharge.  Neck: No JVD present. No tracheal deviation present.  Cardiovascular: Normal rate.  Pulmonary/Chest: Effort normal.  Abdominal: He exhibits no distension.  Musculoskeletal: He exhibits no edema.  Neurological: He is alert.  Fluent speech, no facial droop.  Alert and oriented to person.  Skin: Skin is warm and dry. No erythema.  Psychiatric: He has a normal mood and affect. His behavior is normal.  Nursing note and vitals reviewed.    ED Treatments / Results  Labs (all labs ordered are listed, but only abnormal results are displayed) Labs Reviewed  URINALYSIS, ROUTINE W REFLEX MICROSCOPIC - Abnormal; Notable for the following components:      Result Value   Color, Urine COLORLESS (*)    Glucose, UA >=500 (*)    All other components within normal limits  CBG MONITORING, ED - Abnormal; Notable for the following components:   Glucose-Capillary >600 (*)    All other components within normal limits  I-STAT CHEM 8, ED - Abnormal; Notable for the following components:    Chloride 98 (*)    Glucose, Bld >700 (*)    Hemoglobin 11.2 (*)    HCT 33.0 (*)    All other components within normal limits  CBG MONITORING, ED - Abnormal; Notable for the following components:   Glucose-Capillary 499 (*)    All other components within normal limits    EKG None  Radiology No results found.  Procedures Procedures (including critical care time)  Medications Ordered in ED Medications  insulin aspart protamine- aspart (NOVOLOG MIX 70/30) injection 10 Units (10 Units Subcutaneous Given 05/13/17 1924)     Initial Impression / Assessment and Plan / ED Course  I have reviewed the triage vital signs and the nursing notes.  Pertinent labs & imaging results that were available during my care of the patient  were reviewed by me and considered in my medical decision making (see chart for details).     Patient well-known to the ED presents for evaluation of hyperglycemia.Presents frequently for similar complaints.  He is afebrile, vital signs are at patient's baseline.Initial CBG greater than 700.  I-STAT Chem-8 shows calculated anion gap of 10, by definition patient is not in DKA. Furthermore, no ketonuria noted on UA.He was given subcutaneous insulin with improvement in his blood glucose on reevaluation.  Advised the patient to be more compliant with his home medications and follow-up with his primary care physician.  Stable for discharge at this time. Final Clinical Impressions(s) / ED Diagnoses   Final diagnoses:  Hyperglycemia    ED Discharge Orders    None      Debroah Baller 05/13/17 2111  Milton Ferguson, MD 05/13/17 2302

## 2017-05-13 NOTE — ED Triage Notes (Addendum)
Per [facility] they were called out to [facility] by [employer] where pt was urinating in lobby. Pt had drink while at [employer]. Pt CBG read high. 18G in right hand had about in route.

## 2017-05-13 NOTE — ED Notes (Signed)
Bed: [facility]PT4 Expected date:  Expected time:  Means of arrival:  Comments: 

## 2017-05-13 NOTE — ED Notes (Signed)
Pt urinated on the floor, repeatedly.

## 2017-05-13 NOTE — Discharge Instructions (Signed)
Take your insulin and your home medications as prescribed.  Follow-up with your primary care physician.  Return to the emergency department if any concerning symptoms develop.

## 2017-05-13 NOTE — Discharge Instructions (Addendum)
Take your medications as prescribed.  Follow-up with your doctor to discuss better control of your diabetes.

## 2017-05-13 NOTE — ED Provider Notes (Signed)
Care assumed from Dr. Ethelda Chick at shift change.  Patient well-known to the ED for noncompliant diabetes, hyperglycemia, homelessness.  Presents here with a sugar over.  He has received IV fluids and insulin sugars are now improving.  His electrolytes do not reflect diabetic ketoacidosis.  I see no medical reason for admission.  He will be discharged.   Geoffery Lyons, MD 05/13/17 0300

## 2017-05-14 ENCOUNTER — Emergency Department (HOSPITAL_COMMUNITY)
Admission: EM | Admit: 2017-05-14 | Discharge: 2017-05-15 | Disposition: A | Discharge status: Home / Self Care | Payer: Self-pay | Attending: Emergency Medicine | Admitting: Emergency Medicine

## 2017-05-14 ENCOUNTER — Encounter (HOSPITAL_COMMUNITY): Payer: Self-pay | Admitting: Emergency Medicine

## 2017-05-14 DIAGNOSIS — R739 Hyperglycemia, unspecified: Secondary | ICD-10-CM

## 2017-05-14 DIAGNOSIS — Z7982 Long term (current) use of aspirin: Secondary | ICD-10-CM | POA: Insufficient documentation

## 2017-05-14 DIAGNOSIS — Z794 Long term (current) use of insulin: Secondary | ICD-10-CM | POA: Insufficient documentation

## 2017-05-14 DIAGNOSIS — E1165 Type 2 diabetes mellitus with hyperglycemia: Secondary | ICD-10-CM | POA: Insufficient documentation

## 2017-05-14 DIAGNOSIS — F17210 Nicotine dependence, cigarettes, uncomplicated: Secondary | ICD-10-CM | POA: Insufficient documentation

## 2017-05-14 DIAGNOSIS — Z79899 Other long term (current) drug therapy: Secondary | ICD-10-CM | POA: Insufficient documentation

## 2017-05-14 DIAGNOSIS — I10 Essential (primary) hypertension: Secondary | ICD-10-CM | POA: Insufficient documentation

## 2017-05-14 LAB — URINALYSIS, ROUTINE W REFLEX MICROSCOPIC
Bacteria, UA: NONE SEEN
Bilirubin Urine: NEGATIVE
Hgb urine dipstick: NEGATIVE
Ketones, ur: NEGATIVE mg/dL
LEUKOCYTES UA: NEGATIVE
Nitrite: NEGATIVE
PROTEIN: NEGATIVE mg/dL
Specific Gravity, Urine: 1.02 (ref 1.005–1.030)
pH: 8 (ref 5.0–8.0)

## 2017-05-14 LAB — CBG MONITORING, ED
Glucose-Capillary: 386 mg/dL — ABNORMAL HIGH (ref 65–99)
Glucose-Capillary: >600 mg/dL (ref 65–99)

## 2017-05-14 LAB — BASIC METABOLIC PANEL
ANION GAP: 13 (ref 5–15)
BUN: 17 mg/dL (ref 6–20)
CALCIUM: 9.7 mg/dL (ref 8.9–10.3)
CO2: 26 mmol/L (ref 22–32)
Chloride: 93 mmol/L — ABNORMAL LOW (ref 101–111)
Creatinine, Ser: 1.04 mg/dL (ref 0.61–1.24)
GFR calc Af Amer: >60 mL/min (ref >60)
GLUCOSE: 835 mg/dL — AB (ref 65–99)
POTASSIUM: 4.5 mmol/L (ref 3.5–5.1)
SODIUM: 132 mmol/L — AB (ref 135–145)

## 2017-05-14 LAB — CBC
HCT: 35 % — ABNORMAL LOW (ref 39.0–52.0)
Hemoglobin: 11 g/dL — ABNORMAL LOW (ref 13.0–17.0)
MCH: 25.5 pg — ABNORMAL LOW (ref 26.0–34.0)
MCHC: 31.4 g/dL (ref 30.0–36.0)
MCV: 81.2 fL (ref 78.0–100.0)
Platelets: 329 10*3/uL (ref 150–400)
RBC: 4.31 MIL/uL (ref 4.22–5.81)
RDW: 13.4 % (ref 11.5–15.5)
WBC: 4.1 10*3/uL (ref 4.0–10.5)

## 2017-05-14 MED ORDER — INSULIN ASPART 100 UNIT/ML ~~LOC~~ SOLN
10.0000 [IU] | Freq: Once | SUBCUTANEOUS | Status: AC
  Administered 2017-05-14: 10 [IU] via SUBCUTANEOUS
  Filled 2017-05-14: qty 1

## 2017-05-14 MED ORDER — SODIUM CHLORIDE 0.9 % IV BOLUS
1000.0000 mL | Freq: Once | INTRAVENOUS | Status: AC
  Administered 2017-05-14: 1000 mL via INTRAVENOUS

## 2017-05-14 NOTE — ED Triage Notes (Addendum)
Patient here via EMS with complaints of hyperglycemia. Reading HI on machine.

## 2017-05-14 NOTE — ED Provider Notes (Signed)
[facility] DEPT Provider Note   CSN: [id number] Arrival date & time: 05/14/17  1954     History   Chief Complaint Chief Complaint  Patient presents with  . hyperglycemia    HPI Mike [name] is a 57 y.o. male.  HPI 57 year old male well-known to this ED here with recurrent hyperglycemia.  Patient has had 68 visits in the last 6 months for similar complaints.  Is a history of chronic nonadherence.  He is to the [facility].  He reports that he is here because that sugar is high.  He feels thirsty.  Denies any pain.  Denies any nausea or vomiting.  No shortness of breath.  He does state he has been taking his insulin, but has a history of also eating large amounts of sugar so that he is brought to the ED.  He denies any other complaints.  No HI, SI, or auditory visual hallucinations.  No other complaints.  No recent seizures.  Past Medical History:  Diagnosis Date  . Anxiety   . Anxiety   . Chronic lower back pain   . Depression   . DKA (diabetic ketoacidoses) ([place]) 07/30/2016  . Hyperlipemia   . Hypertension   . Migraine    "last one was ~ 4 yr ago" (12/23/2014)  . Seizures ([place])    "related to pills for anxiety; if I don't take the pills I'm suppose to take I'll have them" (12/23/2014)  . Type II diabetes mellitus [facility])     Patient Active Problem List   Diagnosis Date Noted  . AKI (acute kidney injury) ([place]) 03/04/2017  . Malnutrition of moderate degree 02/08/2017  . DKA (diabetic ketoacidoses) ([place]) 02/07/2017  . History of stroke 02/07/2017  . Hyperphosphatemia 12/13/2016  . Evaluation by psychiatric service required 11/30/2016  . Lactic acidosis 11/18/2016  . Normocytic anemia 11/06/2016  . HLD (hyperlipidemia) 11/06/2016  . Adjustment disorder with other symptoms 11/05/2016  . Uncontrolled type 2 diabetes mellitus with hyperosmolar nonketotic hyperglycemia ([employer]) 08/24/2016  . Essential hypertension 08/24/2016  . Acute ischemic stroke  ([place])   . Protein-calorie malnutrition, severe 12/24/2014  . Homelessness 12/23/2014  . Hypertensive urgency 12/23/2014  . DM (diabetes mellitus) ([place]) 12/23/2014  . Intermittent palpitations 12/23/2014  . Tobacco abuse 12/23/2014  . Pleuritic chest pain 12/23/2014  . Abnormal EKG 12/23/2014  . Type II diabetes mellitus with renal manifestations [facility])     Past Surgical History:  Procedure Laterality Date  . NO PAST SURGERIES          Home Medications    Prior to Admission medications   Medication Sig Start Date End Date Taking? Authorizing Provider  aspirin 325 MG tablet Take 1 tablet (325 mg total) by mouth daily. 03/08/17   Georgette Shell, MD  atorvastatin (LIPITOR) 40 MG tablet Take 1 tablet (40 mg total) by mouth daily. 01/26/17   Cardama, Grayce Sessions, MD  blood glucose meter kit and supplies KIT Dispense based on patient and insurance preference. Use up to four times daily as directed. (FOR ICD-9 250.00, 250.01). 11/22/16   Velvet Bathe, MD  ferrous sulfate 325 (65 FE) MG tablet Take 1 tablet (325 mg total) daily by mouth. Patient taking differently: Take 325 mg by mouth daily as needed (low iron).  11/23/16   Ward, Delice Bison, DO  insulin aspart protamine- aspart (NOVOLOG MIX 70/30) (70-30) 100 UNIT/ML injection Inject 0.3 mLs (30 Units total) into the skin 2 (two) times daily with a meal. 04/30/17  Dorie Rank, MD  lisinopril (PRINIVIL,ZESTRIL) 40 MG tablet Take 1 tablet (40 mg total) by mouth daily. 04/30/17   Dorie Rank, MD  metoprolol tartrate (LOPRESSOR) 25 MG tablet Take 0.5 tablets (12.5 mg total) by mouth 2 (two) times daily. 03/07/17   Georgette Shell, MD    Family History Family History  Problem Relation Age of Onset  . Diabetes Mellitus II Mother   . Diabetes Mellitus II Father     Social History Social History   Tobacco Use  . Smoking status: Current Every Day Smoker    Packs/day: 0.10    Years: 40.00    Pack years: 4.00    Types: Cigarettes    . Smokeless tobacco: Never Used  Substance Use Topics  . Alcohol use: Yes    Alcohol/week: 1.2 oz    Types: 2 Cans of beer per week  . Drug use: No    Comment: 12/23/2014 "stopped ~ 10 yrs ago"     Allergies   Ibuprofen and Tylenol [acetaminophen]   Review of Systems Review of Systems  Constitutional: Positive for fatigue. Negative for chills and fever.  HENT: Negative for congestion and rhinorrhea.   Eyes: Negative for visual disturbance.  Respiratory: Negative for cough, shortness of breath and wheezing.   Cardiovascular: Negative for chest pain and leg swelling.  Gastrointestinal: Negative for abdominal pain, diarrhea, nausea and vomiting.  Endocrine: Positive for polyuria.  Genitourinary: Negative for dysuria and flank pain.  Musculoskeletal: Negative for neck pain and neck stiffness.  Skin: Negative for rash and wound.  Allergic/Immunologic: Negative for immunocompromised state.  Neurological: Negative for syncope, weakness and headaches.  All other systems reviewed and are negative.    Physical Exam Updated Vital Signs BP (!) 173/76   Pulse 60   Temp 98 F (36.7 C) (Oral)   Resp 20   SpO2 99%   Physical Exam  Constitutional: He is oriented to person, place, and time. He appears well-developed and well-nourished. No distress.  Disheveled  HENT:  Head: Normocephalic and atraumatic.  Eyes: Conjunctivae are normal.  Neck: Neck supple.  Cardiovascular: Normal rate, regular rhythm and normal heart sounds. Exam reveals no friction rub.  No murmur heard. Pulmonary/Chest: Effort normal and breath sounds normal. No respiratory distress. He has no wheezes. He has no rales.  Abdominal: He exhibits no distension.  Musculoskeletal: He exhibits no edema.  Neurological: He is alert and oriented to person, place, and time. He exhibits normal muscle tone.  Skin: Skin is warm. Capillary refill takes less than 2 seconds.  Psychiatric: He has a normal mood and affect.   Nursing note and vitals reviewed.    ED Treatments / Results  Labs (all labs ordered are listed, but only abnormal results are displayed) Labs Reviewed  CBC - Abnormal; Notable for the following components:      Result Value   Hemoglobin 11.0 (*)    HCT 35.0 (*)    MCH 25.5 (*)    All other components within normal limits  BASIC METABOLIC PANEL - Abnormal; Notable for the following components:   Sodium 132 (*)    Chloride 93 (*)    Glucose, Bld 835 (*)    All other components within normal limits  URINALYSIS, ROUTINE W REFLEX MICROSCOPIC - Abnormal; Notable for the following components:   Color, Urine COLORLESS (*)    Glucose, UA >=500 (*)    All other components within normal limits  CBG MONITORING, ED - Abnormal; Notable for the following  components:   Glucose-Capillary >600 (*)    All other components within normal limits  CBG MONITORING, ED - Abnormal; Notable for the following components:   Glucose-Capillary 386 (*)    All other components within normal limits  BLOOD GAS, VENOUS  CBG MONITORING, ED    EKG None  Radiology No results found.  Procedures Procedures (including critical care time)  Medications Ordered in ED Medications  sodium chloride 0.9 % bolus 1,000 mL (0 mLs Intravenous Stopped 05/14/17 2308)  sodium chloride 0.9 % bolus 1,000 mL (1,000 mLs Intravenous New Bag/Given 05/14/17 2219)  insulin aspart (novoLOG) injection 10 Units (10 Units Subcutaneous Given 05/14/17 2218)     Initial Impression / Assessment and Plan / ED Course  I have reviewed the triage vital signs and the nursing notes.  Pertinent labs & imaging results that were available during my care of the patient were reviewed by me and considered in my medical decision making (see chart for details).     57 year old male here with recurrent hyperglycemia.  He has a well-documented history of the same.  Lab work today shows no evidence of ketoacidosis.  Bicarb is 26 and anion gap is  13.  Patient was given fluids and insulin with improvement.  Will have him follow-up with his PCP.  Final Clinical Impressions(s) / ED Diagnoses   Final diagnoses:  Hyperglycemia    ED Discharge Orders    None       Duffy Bruce, MD 05/15/17 0000

## 2017-05-15 LAB — BLOOD GAS, VENOUS
ACID-BASE EXCESS: 3.5 mmol/L — AB (ref 0.0–2.0)
Bicarbonate: 29.4 mmol/L — ABNORMAL HIGH (ref 20.0–28.0)
DRAWN BY: 11249
FIO2: 21
O2 SAT: 28.7 %
PATIENT TEMPERATURE: 98.6
PCO2 VEN: 53.8 mmHg (ref 44.0–60.0)
pH, Ven: 7.356 (ref 7.250–7.430)

## 2017-05-16 ENCOUNTER — Emergency Department (HOSPITAL_COMMUNITY)
Admission: EM | Admit: 2017-05-16 | Discharge: 2017-05-16 | Disposition: A | Discharge status: Home / Self Care | Payer: Self-pay | Attending: Emergency Medicine | Admitting: Emergency Medicine

## 2017-05-16 ENCOUNTER — Encounter (HOSPITAL_COMMUNITY): Payer: Self-pay | Admitting: *Deleted

## 2017-05-16 DIAGNOSIS — I10 Essential (primary) hypertension: Secondary | ICD-10-CM | POA: Insufficient documentation

## 2017-05-16 DIAGNOSIS — Z79899 Other long term (current) drug therapy: Secondary | ICD-10-CM | POA: Insufficient documentation

## 2017-05-16 DIAGNOSIS — R739 Hyperglycemia, unspecified: Secondary | ICD-10-CM

## 2017-05-16 DIAGNOSIS — Z5900 Homelessness unspecified: Secondary | ICD-10-CM

## 2017-05-16 DIAGNOSIS — Z7982 Long term (current) use of aspirin: Secondary | ICD-10-CM | POA: Insufficient documentation

## 2017-05-16 DIAGNOSIS — Z9114 Patient's other noncompliance with medication regimen: Secondary | ICD-10-CM | POA: Insufficient documentation

## 2017-05-16 DIAGNOSIS — Z794 Long term (current) use of insulin: Secondary | ICD-10-CM | POA: Insufficient documentation

## 2017-05-16 DIAGNOSIS — Z8673 Personal history of transient ischemic attack (TIA), and cerebral infarction without residual deficits: Secondary | ICD-10-CM | POA: Insufficient documentation

## 2017-05-16 DIAGNOSIS — F17210 Nicotine dependence, cigarettes, uncomplicated: Secondary | ICD-10-CM | POA: Insufficient documentation

## 2017-05-16 DIAGNOSIS — E1165 Type 2 diabetes mellitus with hyperglycemia: Secondary | ICD-10-CM | POA: Insufficient documentation

## 2017-05-16 LAB — I-STAT CHEM 8, ED
BUN: 23 mg/dL — AB (ref 6–20)
CREATININE: 0.9 mg/dL (ref 0.61–1.24)
Calcium, Ion: 1.15 mmol/L (ref 1.15–1.40)
Chloride: 96 mmol/L — ABNORMAL LOW (ref 101–111)
HCT: 34 % — ABNORMAL LOW (ref 39.0–52.0)
Hemoglobin: 11.6 g/dL — ABNORMAL LOW (ref 13.0–17.0)
Potassium: 4.3 mmol/L (ref 3.5–5.1)
Sodium: 134 mmol/L — ABNORMAL LOW (ref 135–145)
TCO2: 26 mmol/L (ref 22–32)

## 2017-05-16 LAB — CBG MONITORING, ED: Glucose-Capillary: >600 mg/dL (ref 65–99)

## 2017-05-16 NOTE — ED Triage Notes (Signed)
Per EMS, pt brought in for hyperglycemia. Pt CBG read high for EMS. Pt called EMS from homeless shelter and asked for his blood sugar to be checked.

## 2017-05-16 NOTE — ED Provider Notes (Signed)
[facility] DEPT Provider Note   CSN: [id number] Arrival date & time: 05/16/17  1524     History   Chief Complaint No chief complaint on file.   HPI Mike [name] is a 57 y.o. male.  HPI Pt seen earlier today for hyperglycemia. Found by fire to be walking on the street ad they felt he looked tired and had him come to the ER for safety check. Pt has no new complaints. He is homeless. He reports poor access to water and food. Reports compliance with his meds today. No new complaints. Didn't want to come back to the ER.    Past Medical History:  Diagnosis Date  . Anxiety   . Anxiety   . Chronic lower back pain   . Depression   . DKA (diabetic ketoacidoses) ([place]) 07/30/2016  . Hyperlipemia   . Hypertension   . Migraine    "last one was ~ 4 yr ago" (12/23/2014)  . Seizures ([place])    "related to pills for anxiety; if I don't take the pills I'm suppose to take I'll have them" (12/23/2014)  . Type II diabetes mellitus [facility])     Patient Active Problem List   Diagnosis Date Noted  . AKI (acute kidney injury) ([place]) 03/04/2017  . Malnutrition of moderate degree 02/08/2017  . DKA (diabetic ketoacidoses) ([place]) 02/07/2017  . History of stroke 02/07/2017  . Hyperphosphatemia 12/13/2016  . Evaluation by psychiatric service required 11/30/2016  . Lactic acidosis 11/18/2016  . Normocytic anemia 11/06/2016  . HLD (hyperlipidemia) 11/06/2016  . Adjustment disorder with other symptoms 11/05/2016  . Uncontrolled type 2 diabetes mellitus with hyperosmolar nonketotic hyperglycemia ([place]) 08/24/2016  . Essential hypertension 08/24/2016  . Acute ischemic stroke ([place])   . Protein-calorie malnutrition, severe 12/24/2014  . Homelessness 12/23/2014  . Hypertensive urgency 12/23/2014  . DM (diabetes mellitus) ([place]) 12/23/2014  . Intermittent palpitations 12/23/2014  . Tobacco abuse 12/23/2014  . Pleuritic chest pain 12/23/2014  . Abnormal EKG 12/23/2014  . Type  II diabetes mellitus with renal manifestations [facility])     Past Surgical History:  Procedure Laterality Date  . NO PAST SURGERIES          Home Medications    Prior to Admission medications   Medication Sig Start Date End Date Taking? Authorizing Provider  aspirin 325 MG tablet Take 1 tablet (325 mg total) by mouth daily. 03/08/17   Georgette Shell, MD  atorvastatin (LIPITOR) 40 MG tablet Take 1 tablet (40 mg total) by mouth daily. 01/26/17   Cardama, Grayce Sessions, MD  blood glucose meter kit and supplies KIT Dispense based on patient and insurance preference. Use up to four times daily as directed. (FOR ICD-9 250.00, 250.01). 11/22/16   Velvet Bathe, MD  ferrous sulfate 325 (65 FE) MG tablet Take 1 tablet (325 mg total) daily by mouth. Patient taking differently: Take 325 mg by mouth daily as needed (low iron).  11/23/16   Ward, Delice Bison, DO  insulin aspart protamine- aspart (NOVOLOG MIX 70/30) (70-30) 100 UNIT/ML injection Inject 0.3 mLs (30 Units total) into the skin 2 (two) times daily with a meal. 04/30/17   Dorie Rank, MD  lisinopril (PRINIVIL,ZESTRIL) 40 MG tablet Take 1 tablet (40 mg total) by mouth daily. 04/30/17   Dorie Rank, MD  metoprolol tartrate (LOPRESSOR) 25 MG tablet Take 0.5 tablets (12.5 mg total) by mouth 2 (two) times daily. 03/07/17   Georgette Shell, MD    Family History Family History  Problem Relation Age of Onset  . Diabetes Mellitus II Mother   . Diabetes Mellitus II Father     Social History Social History   Tobacco Use  . Smoking status: Current Every Day Smoker    Packs/day: 0.10    Years: 40.00    Pack years: 4.00    Types: Cigarettes  . Smokeless tobacco: Never Used  Substance Use Topics  . Alcohol use: Yes    Alcohol/week: 1.2 oz    Types: 2 Cans of beer per week  . Drug use: No    Comment: 12/23/2014 "stopped ~ 10 yrs ago"     Allergies   Ibuprofen and Tylenol [acetaminophen]   Review of Systems Review of Systems  All  other systems reviewed and are negative.    Physical Exam Updated Vital Signs There were no vitals taken for this visit.  Physical Exam  Constitutional: He is oriented to person, place, and time. He appears well-developed and well-nourished.  HENT:  Head: Normocephalic.  Eyes: EOM are normal.  Neck: Normal range of motion.  Pulmonary/Chest: Effort normal.  Abdominal: He exhibits no distension.  Musculoskeletal: Normal range of motion.  Neurological: He is alert and oriented to person, place, and time.  Psychiatric: He has a normal mood and affect.  Nursing note and vitals reviewed.    ED Treatments / Results  Labs (all labs ordered are listed, but only abnormal results are displayed) Labs Reviewed - No data to display  EKG None  Radiology No results found.  Procedures Procedures (including critical care time)  Medications Ordered in ED Medications - No data to display   Initial Impression / Assessment and Plan / ED Course  I have reviewed the triage vital signs and the nursing notes.  Pertinent labs & imaging results that were available during my care of the patient were reviewed by me and considered in my medical decision making (see chart for details).    Baseline for patient. MSE completed.dc home in good condition  Final Clinical Impressions(s) / ED Diagnoses   Final diagnoses:  Hyperglycemia  Homeless    ED Discharge Orders    None       Jola Schmidt, MD 05/16/17 [id number]

## 2017-05-16 NOTE — ED Provider Notes (Signed)
[place] DEPT Provider Note   CSN: [id number] Arrival date & time: 05/16/17  1039     History   Chief Complaint Chief Complaint  Patient presents with  . Hyperglycemia    HPI Mike [name] is a 57 y.o. male.  HPI Patient is a 57 year old male with a history of noncompliance with his diabetes medications who frequently comes to the ER with complaints of elevated blood sugar.  He returns today to the emergency department complaints of elevated blood sugar.  He states he is being compliant with his medications.  This is now his 67th visit to the emergency department in the past 6 months.  He has no fevers or chills.  No cough or congestion.  No shortness of breath.  No chest pain.  Denies abdominal pain.  Denies nausea vomiting diarrhea.   Past Medical History:  Diagnosis Date  . Anxiety   . Anxiety   . Chronic lower back pain   . Depression   . DKA (diabetic ketoacidoses) ([place]) 07/30/2016  . Hyperlipemia   . Hypertension   . Migraine    "last one was ~ 4 yr ago" (12/23/2014)  . Seizures ([place])    "related to pills for anxiety; if I don't take the pills I'm suppose to take I'll have them" (12/23/2014)  . Type II diabetes mellitus [facility])     Patient Active Problem List   Diagnosis Date Noted  . AKI (acute kidney injury) ([place]) 03/04/2017  . Malnutrition of moderate degree 02/08/2017  . DKA (diabetic ketoacidoses) ([place]) 02/07/2017  . History of stroke 02/07/2017  . Hyperphosphatemia 12/13/2016  . Evaluation by psychiatric service required 11/30/2016  . Lactic acidosis 11/18/2016  . Normocytic anemia 11/06/2016  . HLD (hyperlipidemia) 11/06/2016  . Adjustment disorder with other symptoms 11/05/2016  . Uncontrolled type 2 diabetes mellitus with hyperosmolar nonketotic hyperglycemia ([place]) 08/24/2016  . Essential hypertension 08/24/2016  . Acute ischemic stroke ([place])   . Protein-calorie malnutrition, severe 12/24/2014  . Homelessness  12/23/2014  . Hypertensive urgency 12/23/2014  . DM (diabetes mellitus) ([place]) 12/23/2014  . Intermittent palpitations 12/23/2014  . Tobacco abuse 12/23/2014  . Pleuritic chest pain 12/23/2014  . Abnormal EKG 12/23/2014  . Type II diabetes mellitus with renal manifestations [facility])     Past Surgical History:  Procedure Laterality Date  . NO PAST SURGERIES          Home Medications    Prior to Admission medications   Medication Sig Start Date End Date Taking? Authorizing Provider  aspirin 325 MG tablet Take 1 tablet (325 mg total) by mouth daily. 03/08/17   Georgette Shell, MD  atorvastatin (LIPITOR) 40 MG tablet Take 1 tablet (40 mg total) by mouth daily. 01/26/17   Cardama, Grayce Sessions, MD  blood glucose meter kit and supplies KIT Dispense based on patient and insurance preference. Use up to four times daily as directed. (FOR ICD-9 250.00, 250.01). 11/22/16   Velvet Bathe, MD  ferrous sulfate 325 (65 FE) MG tablet Take 1 tablet (325 mg total) daily by mouth. Patient taking differently: Take 325 mg by mouth daily as needed (low iron).  11/23/16   Ward, Delice Bison, DO  insulin aspart protamine- aspart (NOVOLOG MIX 70/30) (70-30) 100 UNIT/ML injection Inject 0.3 mLs (30 Units total) into the skin 2 (two) times daily with a meal. 04/30/17   Dorie Rank, MD  lisinopril (PRINIVIL,ZESTRIL) 40 MG tablet Take 1 tablet (40 mg total) by mouth daily. 04/30/17   Tomi Bamberger,  Wille Glaser, MD  metoprolol tartrate (LOPRESSOR) 25 MG tablet Take 0.5 tablets (12.5 mg total) by mouth 2 (two) times daily. 03/07/17   Georgette Shell, MD    Family History Family History  Problem Relation Age of Onset  . Diabetes Mellitus II Mother   . Diabetes Mellitus II Father     Social History Social History   Tobacco Use  . Smoking status: Current Every Day Smoker    Packs/day: 0.10    Years: 40.00    Pack years: 4.00    Types: Cigarettes  . Smokeless tobacco: Never Used  Substance Use Topics  . Alcohol use: Yes      Alcohol/week: 1.2 oz    Types: 2 Cans of beer per week  . Drug use: No    Comment: 12/23/2014 "stopped ~ 10 yrs ago"     Allergies   Ibuprofen and Tylenol [acetaminophen]   Review of Systems Review of Systems  All other systems reviewed and are negative.    Physical Exam Updated Vital Signs BP 130/64 (BP Location: Left Arm)   Temp 98.9 F (37.2 C) (Oral)   Resp 18   SpO2 97%   Physical Exam  Constitutional: He is oriented to person, place, and time. He appears well-developed.  Disheveled appearing  HENT:  Head: Normocephalic.  Eyes: EOM are normal.  Neck: Normal range of motion.  Pulmonary/Chest: Effort normal.  Abdominal: He exhibits no distension.  Musculoskeletal: Normal range of motion.  Neurological: He is alert and oriented to person, place, and time.  Psychiatric: He has a normal mood and affect.  Nursing note and vitals reviewed.    ED Treatments / Results  Labs (all labs ordered are listed, but only abnormal results are displayed) Labs Reviewed  I-STAT CHEM 8, ED - Abnormal; Notable for the following components:      Result Value   Sodium 134 (*)    Chloride 96 (*)    BUN 23 (*)    Glucose, Bld >700 (*)    Hemoglobin 11.6 (*)    HCT 34.0 (*)    All other components within normal limits  CBG MONITORING, ED - Abnormal; Notable for the following components:   Glucose-Capillary >600 (*)    All other components within normal limits    EKG None  Radiology No results found.  Procedures Procedures (including critical care time)  Medications Ordered in ED Medications - No data to display   Initial Impression / Assessment and Plan / ED Course  I have reviewed the triage vital signs and the nursing notes.  Pertinent labs & imaging results that were available during my care of the patient were reviewed by me and considered in my medical decision making (see chart for details).     Anion gap 12. Blood sugar noted. Chronic issue for the  patient.  Vitals stable. Dc home. Recommended oral hydration at home and pcp follow up  Final Clinical Impressions(s) / ED Diagnoses   Final diagnoses:  Hyperglycemia  H/O medication noncompliance    ED Discharge Orders    None      Jola Schmidt, MD 05/16/17 1141

## 2017-05-16 NOTE — ED Notes (Signed)
Bed: [facility]PT4 Expected date:  Expected time:  Means of arrival:  Comments: 

## 2017-05-16 NOTE — ED Triage Notes (Deleted)
Pt sent by PCP for hyperglycemia. Pt states she has had blurred vision for the past few weeks, has polydipsia and polyuria, weight loss.  

## 2017-05-16 NOTE — ED Triage Notes (Signed)
Per EMS, someone called EMS when the pt was sitting on their porch. Pt denies complaints at this time.

## 2017-05-18 ENCOUNTER — Other Ambulatory Visit: Payer: Self-pay

## 2017-05-18 ENCOUNTER — Emergency Department (HOSPITAL_COMMUNITY)
Admission: EM | Admit: 2017-05-18 | Discharge: 2017-05-19 | Disposition: A | Discharge status: Home / Self Care | Payer: Self-pay | Attending: Emergency Medicine | Admitting: Emergency Medicine

## 2017-05-18 ENCOUNTER — Encounter (HOSPITAL_COMMUNITY): Payer: Self-pay

## 2017-05-18 ENCOUNTER — Emergency Department (HOSPITAL_COMMUNITY)
Admission: EM | Admit: 2017-05-18 | Discharge: 2017-05-18 | Disposition: A | Discharge status: Home / Self Care | Payer: Self-pay | Attending: Emergency Medicine | Admitting: Emergency Medicine

## 2017-05-18 DIAGNOSIS — F17210 Nicotine dependence, cigarettes, uncomplicated: Secondary | ICD-10-CM | POA: Insufficient documentation

## 2017-05-18 DIAGNOSIS — Z5900 Homelessness unspecified: Secondary | ICD-10-CM

## 2017-05-18 DIAGNOSIS — I10 Essential (primary) hypertension: Secondary | ICD-10-CM | POA: Insufficient documentation

## 2017-05-18 DIAGNOSIS — Z794 Long term (current) use of insulin: Secondary | ICD-10-CM | POA: Insufficient documentation

## 2017-05-18 DIAGNOSIS — E1165 Type 2 diabetes mellitus with hyperglycemia: Secondary | ICD-10-CM | POA: Insufficient documentation

## 2017-05-18 DIAGNOSIS — Z79899 Other long term (current) drug therapy: Secondary | ICD-10-CM | POA: Insufficient documentation

## 2017-05-18 DIAGNOSIS — Z7982 Long term (current) use of aspirin: Secondary | ICD-10-CM | POA: Insufficient documentation

## 2017-05-18 DIAGNOSIS — R739 Hyperglycemia, unspecified: Secondary | ICD-10-CM

## 2017-05-18 LAB — CBG MONITORING, ED
GLUCOSE-CAPILLARY: 493 mg/dL — AB (ref 65–99)
Glucose-Capillary: >600 mg/dL (ref 65–99)
Glucose-Capillary: >600 mg/dL (ref 65–99)
Glucose-Capillary: 589 mg/dL (ref 65–99)

## 2017-05-18 LAB — I-STAT CHEM 8, ED
BUN: 15 mg/dL (ref 6–20)
BUN: 12 mg/dL (ref 6–20)
CALCIUM ION: 1.15 mmol/L (ref 1.15–1.40)
CHLORIDE: 91 mmol/L — AB (ref 101–111)
CHLORIDE: 86 mmol/L — AB (ref 101–111)
Calcium, Ion: 1.13 mmol/L — ABNORMAL LOW (ref 1.15–1.40)
Creatinine, Ser: 0.8 mg/dL (ref 0.61–1.24)
Creatinine, Ser: 0.9 mg/dL (ref 0.61–1.24)
Glucose, Bld: 632 mg/dL (ref 65–99)
HEMATOCRIT: 41 % (ref 39.0–52.0)
HEMATOCRIT: 32 % — AB (ref 39.0–52.0)
HEMOGLOBIN: 13.9 g/dL (ref 13.0–17.0)
Hemoglobin: 10.9 g/dL — ABNORMAL LOW (ref 13.0–17.0)
POTASSIUM: 4.3 mmol/L (ref 3.5–5.1)
Potassium: 3.7 mmol/L (ref 3.5–5.1)
SODIUM: 129 mmol/L — AB (ref 135–145)
SODIUM: 125 mmol/L — AB (ref 135–145)
TCO2: 32 mmol/L (ref 22–32)
TCO2: 27 mmol/L (ref 22–32)

## 2017-05-18 LAB — CBC WITH DIFFERENTIAL/PLATELET
BASOS ABS: 0 10*3/uL (ref 0.0–0.1)
Basophils Relative: 1 %
EOS PCT: 1 %
Eosinophils Absolute: 0.1 10*3/uL (ref 0.0–0.7)
HCT: 29.5 % — ABNORMAL LOW (ref 39.0–52.0)
Hemoglobin: 9.6 g/dL — ABNORMAL LOW (ref 13.0–17.0)
LYMPHS ABS: 1.6 10*3/uL (ref 0.7–4.0)
LYMPHS PCT: 34 %
MCH: 25.7 pg — AB (ref 26.0–34.0)
MCHC: 32.5 g/dL (ref 30.0–36.0)
MCV: 78.9 fL (ref 78.0–100.0)
MONO ABS: 0.3 10*3/uL (ref 0.1–1.0)
Monocytes Relative: 7 %
Neutro Abs: 2.7 10*3/uL (ref 1.7–7.7)
Neutrophils Relative %: 57 %
PLATELETS: 331 10*3/uL (ref 150–400)
RBC: 3.74 MIL/uL — ABNORMAL LOW (ref 4.22–5.81)
RDW: 12.8 % (ref 11.5–15.5)
WBC: 4.7 10*3/uL (ref 4.0–10.5)

## 2017-05-18 LAB — BASIC METABOLIC PANEL
Anion gap: 10 (ref 5–15)
BUN: 13 mg/dL (ref 6–20)
CO2: 26 mmol/L (ref 22–32)
Calcium: 8.9 mg/dL (ref 8.9–10.3)
Chloride: 90 mmol/L — ABNORMAL LOW (ref 101–111)
Creatinine, Ser: 0.98 mg/dL (ref 0.61–1.24)
Glucose, Bld: 789 mg/dL (ref 65–99)
Potassium: 3.8 mmol/L (ref 3.5–5.1)
SODIUM: 126 mmol/L — AB (ref 135–145)

## 2017-05-18 MED ORDER — SODIUM CHLORIDE 0.9 % IV SOLN
INTRAVENOUS | Status: DC
  Filled 2017-05-18: qty 1

## 2017-05-18 MED ORDER — INSULIN ASPART PROT & ASPART (70-30 MIX) 100 UNIT/ML ~~LOC~~ SUSP
14.0000 [IU] | Freq: Once | SUBCUTANEOUS | Status: DC
  Filled 2017-05-18: qty 10

## 2017-05-18 MED ORDER — INSULIN ASPART 100 UNIT/ML IV SOLN
6.0000 [IU] | Freq: Once | INTRAVENOUS | Status: AC
  Administered 2017-05-18: 6 [IU] via INTRAVENOUS
  Filled 2017-05-18: qty 0.06

## 2017-05-18 MED ORDER — INSULIN ASPART 100 UNIT/ML IV SOLN
10.0000 [IU] | Freq: Once | INTRAVENOUS | Status: AC
  Administered 2017-05-18: 10 [IU] via INTRAVENOUS
  Filled 2017-05-18: qty 0.1

## 2017-05-18 MED ORDER — LACTATED RINGERS IV BOLUS
1000.0000 mL | Freq: Once | INTRAVENOUS | Status: AC
  Administered 2017-05-18: 1000 mL via INTRAVENOUS

## 2017-05-18 MED ORDER — SODIUM CHLORIDE 0.9 % IV BOLUS
1000.0000 mL | Freq: Once | INTRAVENOUS | Status: AC
  Administered 2017-05-18: 1000 mL via INTRAVENOUS

## 2017-05-18 MED ORDER — INSULIN ASPART 100 UNIT/ML ~~LOC~~ SOLN
10.0000 [IU] | Freq: Once | SUBCUTANEOUS | Status: AC
  Administered 2017-05-18: 10 [IU] via SUBCUTANEOUS
  Filled 2017-05-18: qty 1

## 2017-05-18 MED ORDER — DEXTROSE-NACL 5-0.45 % IV SOLN: INTRAVENOUS | Status: DC

## 2017-05-18 NOTE — ED Notes (Signed)
Bed: WTR5 Expected date:  Expected time:  Means of arrival:  Comments: 

## 2017-05-18 NOTE — ED Triage Notes (Signed)
Pt was at the shelter and requested EMS be called because his sugar was high.  Pt was was seen this morning. BP: 145/83 HR: 86 RR: 18 CBG: over 600

## 2017-05-18 NOTE — ED Provider Notes (Signed)
[facility] DEPT Provider Note   CSN: [id number] Arrival date & time: 05/18/17  1945     History   Chief Complaint Chief Complaint  Patient presents with  . Hyperglycemia    HPI Mike [name] is a 57 y.o. male h/o diabetes non compliant with insulin, DKA, multiple ED visits for hyperglycemia here for evaluation of high blood sugar. Associated with being thirsty and increased urination. Pt seen and discharged from ED approx 12 hours ago for same.  States after he left the ER he ate some sweets. He denies any fevers, pain anywhere, cough, nausea, vomiting. Onset: gradual. No modifying factors. No interventions PTA.    HPI  Past Medical History:  Diagnosis Date  . Anxiety   . Anxiety   . Chronic lower back pain   . Depression   . DKA (diabetic ketoacidoses) ([place]) 07/30/2016  . Hyperlipemia   . Hypertension   . Migraine    "last one was ~ 4 yr ago" (12/23/2014)  . Seizures ([place])    "related to pills for anxiety; if I don't take the pills I'm suppose to take I'll have them" (12/23/2014)  . Type II diabetes mellitus [facility])     Patient Active Problem List   Diagnosis Date Noted  . AKI (acute kidney injury) ([place]) 03/04/2017  . Malnutrition of moderate degree 02/08/2017  . DKA (diabetic ketoacidoses) ([place]) 02/07/2017  . History of stroke 02/07/2017  . Hyperphosphatemia 12/13/2016  . Evaluation by psychiatric service required 11/30/2016  . Lactic acidosis 11/18/2016  . Normocytic anemia 11/06/2016  . HLD (hyperlipidemia) 11/06/2016  . Adjustment disorder with other symptoms 11/05/2016  . Uncontrolled type 2 diabetes mellitus with hyperosmolar nonketotic hyperglycemia ([place]) 08/24/2016  . Essential hypertension 08/24/2016  . Acute ischemic stroke ([place])   . Protein-calorie malnutrition, severe 12/24/2014  . Homelessness 12/23/2014  . Hypertensive urgency 12/23/2014  . DM (diabetes mellitus) ([place]) 12/23/2014  . Intermittent palpitations  12/23/2014  . Tobacco abuse 12/23/2014  . Pleuritic chest pain 12/23/2014  . Abnormal EKG 12/23/2014  . Type II diabetes mellitus with renal manifestations [facility])     Past Surgical History:  Procedure Laterality Date  . NO PAST SURGERIES          Home Medications    Prior to Admission medications   Medication Sig Start Date End Date Taking? Authorizing Provider  aspirin 325 MG tablet Take 1 tablet (325 mg total) by mouth daily. 03/08/17  Yes Georgette Shell, MD  atorvastatin (LIPITOR) 40 MG tablet Take 1 tablet (40 mg total) by mouth daily. 01/26/17  Yes Cardama, Grayce Sessions, MD  blood glucose meter kit and supplies KIT Dispense based on patient and insurance preference. Use up to four times daily as directed. (FOR ICD-9 250.00, 250.01). 11/22/16  Yes Velvet Bathe, MD  ferrous sulfate 325 (65 FE) MG tablet Take 1 tablet (325 mg total) daily by mouth. Patient taking differently: Take 325 mg by mouth daily as needed (low iron).  11/23/16  Yes Ward, Cyril Mourning N, DO  insulin aspart protamine- aspart (NOVOLOG MIX 70/30) (70-30) 100 UNIT/ML injection Inject 0.3 mLs (30 Units total) into the skin 2 (two) times daily with a meal. 04/30/17  Yes Dorie Rank, MD  lisinopril (PRINIVIL,ZESTRIL) 40 MG tablet Take 1 tablet (40 mg total) by mouth daily. 04/30/17  Yes Dorie Rank, MD  metoprolol tartrate (LOPRESSOR) 25 MG tablet Take 0.5 tablets (12.5 mg total) by mouth 2 (two) times daily. 03/07/17  Yes Georgette Shell, MD  Family History Family History  Problem Relation Age of Onset  . Diabetes Mellitus II Mother   . Diabetes Mellitus II Father     Social History Social History   Tobacco Use  . Smoking status: Current Every Day Smoker    Packs/day: 0.10    Years: 40.00    Pack years: 4.00    Types: Cigarettes  . Smokeless tobacco: Never Used  Substance Use Topics  . Alcohol use: Yes    Alcohol/week: 1.2 oz    Types: 2 Cans of beer per week  . Drug use: No    Comment: 12/23/2014  "stopped ~ 10 yrs ago"     Allergies   Ibuprofen and Tylenol [acetaminophen]   Review of Systems Review of Systems  All other systems reviewed and are negative.    Physical Exam Updated Vital Signs BP (!) 164/93 (BP Location: Left Arm)   Pulse (!) 56   Temp 99.2 F (37.3 C) (Oral)   Resp 16   Ht '5\' 8"'$  (1.727 m)   Wt 77.1 kg (170 lb)   SpO2 98%   BMI 25.85 kg/m   Physical Exam  Constitutional: He is oriented to person, place, and time. He appears well-developed and well-nourished. No distress.  Non toxic. Poor hygiene.   HENT:  Head: Normocephalic and atraumatic.  Nose: Nose normal.  Mouth/Throat: No oropharyngeal exudate.  Moist mucous membranes   Eyes: Pupils are equal, round, and reactive to light. Conjunctivae and EOM are normal.  Neck: Normal range of motion.  Cardiovascular: Normal rate, regular rhythm, normal heart sounds and intact distal pulses.  No murmur heard. 2+ DP and radial pulses bilaterally. No LE edema.   Pulmonary/Chest: Effort normal and breath sounds normal.  Abdominal: Soft. Bowel sounds are normal. There is no tenderness.  No G/R/R. No suprapubic or CVA tenderness.   Musculoskeletal: Normal range of motion. He exhibits no deformity.  Neurological: He is alert and oriented to person, place, and time.  Skin: Skin is warm and dry. Capillary refill takes less than 2 seconds.  Psychiatric: He has a normal mood and affect. His behavior is normal. Judgment and thought content normal.  Nursing note and vitals reviewed.    ED Treatments / Results  Labs (all labs ordered are listed, but only abnormal results are displayed) Labs Reviewed  BASIC METABOLIC PANEL - Abnormal; Notable for the following components:      Result Value   Sodium 126 (*)    Chloride 90 (*)    Glucose, Bld 789 (*)    All other components within normal limits  CBC WITH DIFFERENTIAL/PLATELET - Abnormal; Notable for the following components:   RBC 3.74 (*)    Hemoglobin 9.6  (*)    HCT 29.5 (*)    MCH 25.7 (*)    All other components within normal limits  CBG MONITORING, ED - Abnormal; Notable for the following components:   Glucose-Capillary >600 (*)    All other components within normal limits  I-STAT CHEM 8, ED - Abnormal; Notable for the following components:   Sodium 125 (*)    Chloride 86 (*)    Glucose, Bld >700 (*)    Hemoglobin 10.9 (*)    HCT 32.0 (*)    All other components within normal limits  CBG MONITORING, ED - Abnormal; Notable for the following components:   Glucose-Capillary 589 (*)    All other components within normal limits  CBG MONITORING, ED - Abnormal; Notable for the following components:  Glucose-Capillary 386 (*)    All other components within normal limits    EKG None  Radiology No results found.  Procedures Procedures (including critical care time)  Medications Ordered in ED Medications  sodium chloride 0.9 % bolus 1,000 mL (0 mLs Intravenous Stopped 05/18/17 2137)  sodium chloride 0.9 % bolus 1,000 mL (1,000 mLs Intravenous New Bag/Given 05/18/17 2252)  insulin aspart (novoLOG) injection 10 Units (10 Units Subcutaneous Given 05/18/17 2251)     Initial Impression / Assessment and Plan / ED Course  I have reviewed the triage vital signs and the nursing notes.  Pertinent labs & imaging results that were available during my care of the patient were reviewed by me and considered in my medical decision making (see chart for details).     Pt is a 57 y.o. male with hx diabetes on insulin non compliance homelessness and frequent ER visits for hyperglycemia who presents with "high sugar".  Seen and discharged from ER 12 hours ago for same.  On exam, pt in NAD. Nontoxic/nonseptic appearing. VS with hypertension, stable. Afebrile. Cardiopulmonary exam benign. Abdomen soft NTND.  Initial CBG 789. Potassium 3.8. No gap. No signs of DKA or HHS. Patient given 2 L IVF and 10 U insulin ED.  ED lab work consistent with uncomplicated  hyperglycemia.  Repeat CBG improving, latest 386.  Plan is to discharge with strict ED return precautions, f/u with endocrinologist/PCP.    Final Clinical Impressions(s) / ED Diagnoses   Final diagnoses:  Hyperglycemia    ED Discharge Orders    None       Kinnie Feil, PA-C 05/19/17 0019    Charlesetta Shanks, MD 05/21/17 1401

## 2017-05-18 NOTE — ED Notes (Signed)
Patient not in room at this time. Will recheck shortly.

## 2017-05-18 NOTE — ED Notes (Signed)
Bed: [facility] Expected date:  Expected time:  Means of arrival:  Comments: EMS 57 yo male from Shelter/hyperglycemia-seen already today/triage

## 2017-05-18 NOTE — ED Provider Notes (Signed)
[facility] DEPT Provider Note   CSN: [id number] Arrival date & time: 05/18/17  0900     History   Chief Complaint Chief Complaint  Patient presents with  . Hyperglycemia    HPI Mike [name] is a 57 y.o. male.  HPI 57 year old male comes in with chief complaint of elevated blood sugar.  Patient has history of poorly controlled diabetes and he is homeless.  Patient was wandering at the [facility] and EMS was called.  Patient is noted to have elevated blood sugar.  Patient has no complaints.  Denies any nausea or vomiting.   Past Medical History:  Diagnosis Date  . Anxiety   . Anxiety   . Chronic lower back pain   . Depression   . DKA (diabetic ketoacidoses) ([place]) 07/30/2016  . Hyperlipemia   . Hypertension   . Migraine    "last one was ~ 4 yr ago" (12/23/2014)  . Seizures ([place])    "related to pills for anxiety; if I don't take the pills I'm suppose to take I'll have them" (12/23/2014)  . Type II diabetes mellitus [facility])     Patient Active Problem List   Diagnosis Date Noted  . AKI (acute kidney injury) ([place]) 03/04/2017  . Malnutrition of moderate degree 02/08/2017  . DKA (diabetic ketoacidoses) ([place]) 02/07/2017  . History of stroke 02/07/2017  . Hyperphosphatemia 12/13/2016  . Evaluation by psychiatric service required 11/30/2016  . Lactic acidosis 11/18/2016  . Normocytic anemia 11/06/2016  . HLD (hyperlipidemia) 11/06/2016  . Adjustment disorder with other symptoms 11/05/2016  . Uncontrolled type 2 diabetes mellitus with hyperosmolar nonketotic hyperglycemia ([place]) 08/24/2016  . Essential hypertension 08/24/2016  . Acute ischemic stroke ([place])   . Protein-calorie malnutrition, severe 12/24/2014  . Homelessness 12/23/2014  . Hypertensive urgency 12/23/2014  . DM (diabetes mellitus) ([place]) 12/23/2014  . Intermittent palpitations 12/23/2014  . Tobacco abuse 12/23/2014  . Pleuritic chest pain 12/23/2014  . Abnormal EKG 12/23/2014    . Type II diabetes mellitus with renal manifestations [facility])     Past Surgical History:  Procedure Laterality Date  . NO PAST SURGERIES          Home Medications    Prior to Admission medications   Medication Sig Start Date End Date Taking? Authorizing Provider  aspirin 325 MG tablet Take 1 tablet (325 mg total) by mouth daily. 03/08/17   Georgette Shell, MD  atorvastatin (LIPITOR) 40 MG tablet Take 1 tablet (40 mg total) by mouth daily. 01/26/17   Cardama, Grayce Sessions, MD  blood glucose meter kit and supplies KIT Dispense based on patient and insurance preference. Use up to four times daily as directed. (FOR ICD-9 250.00, 250.01). 11/22/16   Velvet Bathe, MD  ferrous sulfate 325 (65 FE) MG tablet Take 1 tablet (325 mg total) daily by mouth. Patient taking differently: Take 325 mg by mouth daily as needed (low iron).  11/23/16   Ward, Delice Bison, DO  insulin aspart protamine- aspart (NOVOLOG MIX 70/30) (70-30) 100 UNIT/ML injection Inject 0.3 mLs (30 Units total) into the skin 2 (two) times daily with a meal. 04/30/17   Dorie Rank, MD  lisinopril (PRINIVIL,ZESTRIL) 40 MG tablet Take 1 tablet (40 mg total) by mouth daily. 04/30/17   Dorie Rank, MD  metoprolol tartrate (LOPRESSOR) 25 MG tablet Take 0.5 tablets (12.5 mg total) by mouth 2 (two) times daily. 03/07/17   Georgette Shell, MD    Family History Family History  Problem Relation Age  of Onset  . Diabetes Mellitus II Mother   . Diabetes Mellitus II Father     Social History Social History   Tobacco Use  . Smoking status: Current Every Day Smoker    Packs/day: 0.10    Years: 40.00    Pack years: 4.00    Types: Cigarettes  . Smokeless tobacco: Never Used  Substance Use Topics  . Alcohol use: Yes    Alcohol/week: 1.2 oz    Types: 2 Cans of beer per week  . Drug use: No    Comment: 12/23/2014 "stopped ~ 10 yrs ago"     Allergies   Ibuprofen and Tylenol [acetaminophen]   Review of Systems Review of Systems   Constitutional: Negative for activity change and fever.  Respiratory: Negative for shortness of breath.   Cardiovascular: Negative for chest pain.  Gastrointestinal: Negative for abdominal pain, nausea and vomiting.     Physical Exam Updated Vital Signs BP (!) 167/96 (BP Location: Right Arm)   Pulse 61   Temp 97.8 F (36.6 C) (Oral)   Resp 18   Ht _0  (1.727 m)   Wt 77.1 kg (170 lb)   SpO2 100%   BMI 25.85 kg/m   Physical Exam  Constitutional:  Patient is in hospital scrubs, he is soiled  HENT:  Head: Atraumatic.  Neck: Neck supple.  Cardiovascular: Normal rate.  Pulmonary/Chest: Effort normal.  Abdominal: Soft. There is no tenderness.  Neurological: He is alert.  Skin: Skin is warm.  Nursing note and vitals reviewed.    ED Treatments / Results  Labs (all labs ordered are listed, but only abnormal results are displayed) Labs Reviewed  I-STAT CHEM 8, ED - Abnormal; Notable for the following components:      Result Value   Sodium 129 (*)    Chloride 91 (*)    Glucose, Bld 632 (*)    Calcium, Ion 1.13 (*)    All other components within normal limits  CBG MONITORING, ED - Abnormal; Notable for the following components:   Glucose-Capillary >600 (*)    All other components within normal limits  CBG MONITORING, ED    EKG None  Radiology No results found.  Procedures Procedures (including critical care time)  Medications Ordered in ED Medications  insulin aspart (novoLOG) injection 10 Units (has no administration in time range)  lactated ringers bolus 1,000 mL (has no administration in time range)     Initial Impression / Assessment and Plan / ED Course  I have reviewed the triage vital signs and the nursing notes.  Pertinent labs & imaging results that were available during my care of the patient were reviewed by me and considered in my medical decision making (see chart for details).     57 year old male comes in with chief complaint of  elevated blood sugar.  He has history of poorly controlled diabetes and homeless.  Patient has had multiple ED visits for elevated blood sugar.  We will start IV hydration and ensure he is not in DKA.  Underlying cause for his hyperglycemia is medication noncompliance.  I do not see any signs of infection or severe trauma that needs emergent attention.  Once the blood sugar is slightly corrected and patient has passed p.o. challenge we will discharge him. Final Clinical Impressions(s) / ED Diagnoses   Final diagnoses:  Hyperglycemia  Homelessness    ED Discharge Orders    None       Varney Biles, MD 05/18/17 1050

## 2017-05-18 NOTE — ED Triage Notes (Addendum)
Per EMS- patient wandered into [facility]. EMS was called. CBG-517. BP-114/70, HR-84, R-18, Sats-98% room air.

## 2017-05-18 NOTE — ED Notes (Signed)
Patient is visibly soiled with feces all over pants, socks and shoes. This Clinical research associate offered to take patient to the shower to get him cleaned up. When asked patient stated "no, not at all."

## 2017-05-19 ENCOUNTER — Emergency Department (HOSPITAL_COMMUNITY)
Admission: EM | Admit: 2017-05-19 | Discharge: 2017-05-20 | Disposition: A | Discharge status: Home / Self Care | Payer: Self-pay | Attending: Emergency Medicine | Admitting: Emergency Medicine

## 2017-05-19 DIAGNOSIS — I10 Essential (primary) hypertension: Secondary | ICD-10-CM | POA: Insufficient documentation

## 2017-05-19 DIAGNOSIS — Z79899 Other long term (current) drug therapy: Secondary | ICD-10-CM | POA: Insufficient documentation

## 2017-05-19 DIAGNOSIS — Z7982 Long term (current) use of aspirin: Secondary | ICD-10-CM | POA: Insufficient documentation

## 2017-05-19 DIAGNOSIS — F17210 Nicotine dependence, cigarettes, uncomplicated: Secondary | ICD-10-CM | POA: Insufficient documentation

## 2017-05-19 DIAGNOSIS — E1165 Type 2 diabetes mellitus with hyperglycemia: Secondary | ICD-10-CM | POA: Insufficient documentation

## 2017-05-19 DIAGNOSIS — Z8673 Personal history of transient ischemic attack (TIA), and cerebral infarction without residual deficits: Secondary | ICD-10-CM | POA: Insufficient documentation

## 2017-05-19 DIAGNOSIS — R739 Hyperglycemia, unspecified: Secondary | ICD-10-CM

## 2017-05-19 LAB — URINALYSIS, ROUTINE W REFLEX MICROSCOPIC
BILIRUBIN URINE: NEGATIVE
Bacteria, UA: NONE SEEN
HGB URINE DIPSTICK: NEGATIVE
Ketones, ur: 5 mg/dL — AB
Leukocytes, UA: NEGATIVE
NITRITE: NEGATIVE
Protein, ur: NEGATIVE mg/dL
Specific Gravity, Urine: 1.025 (ref 1.005–1.030)
pH: 6 (ref 5.0–8.0)

## 2017-05-19 LAB — CBG MONITORING, ED: GLUCOSE-CAPILLARY: 386 mg/dL — AB (ref 65–99)

## 2017-05-19 LAB — CBC WITH DIFFERENTIAL/PLATELET
BASOS ABS: 0 10*3/uL (ref 0.0–0.1)
BASOS PCT: 0 %
EOS ABS: 0 10*3/uL (ref 0.0–0.7)
Eosinophils Relative: 0 %
HCT: 32.9 % — ABNORMAL LOW (ref 39.0–52.0)
Hemoglobin: 10.6 g/dL — ABNORMAL LOW (ref 13.0–17.0)
LYMPHS ABS: 1.5 10*3/uL (ref 0.7–4.0)
Lymphocytes Relative: 26 %
MCH: 25.4 pg — AB (ref 26.0–34.0)
MCHC: 32.2 g/dL (ref 30.0–36.0)
MCV: 78.7 fL (ref 78.0–100.0)
Monocytes Absolute: 0.4 10*3/uL (ref 0.1–1.0)
Monocytes Relative: 6 %
NEUTROS PCT: 68 %
Neutro Abs: 4 10*3/uL (ref 1.7–7.7)
PLATELETS: 327 10*3/uL (ref 150–400)
RBC: 4.18 MIL/uL — AB (ref 4.22–5.81)
RDW: 13.3 % (ref 11.5–15.5)
WBC: 5.9 10*3/uL (ref 4.0–10.5)

## 2017-05-19 LAB — I-STAT CG4 LACTIC ACID, ED: Lactic Acid, Venous: 1.12 mmol/L (ref 0.5–1.9)

## 2017-05-19 MED ORDER — SODIUM CHLORIDE 0.9 % IV BOLUS
1000.0000 mL | Freq: Once | INTRAVENOUS | Status: AC
  Administered 2017-05-19: 1000 mL via INTRAVENOUS

## 2017-05-19 MED ORDER — INSULIN ASPART 100 UNIT/ML ~~LOC~~ SOLN
10.0000 [IU] | Freq: Once | SUBCUTANEOUS | Status: AC
  Administered 2017-05-19: 10 [IU] via SUBCUTANEOUS
  Filled 2017-05-19: qty 1

## 2017-05-19 NOTE — ED Provider Notes (Signed)
[facility] EMERGENCY DEPARTMENT Provider Note   CSN: [id number] Arrival date & time: 05/19/17  2152     History   Chief Complaint Chief Complaint  Patient presents with  . Hyperglycemia    HPI Mike [name] is a 57 y.o. male.  Patient well-known to the ED.  Presents with elevated blood sugar.  He is a noncompliant diabetic.  Was seen 12 hours ago for the same.  States he has been taking his insulin but eating a lot of "sweets".  States he knew his blood sugar was high from this and called EMS.  He denies any nausea or vomiting.  No fever.  No chest pain or shortness of breath.  No change in urinary output.  Denies any alcohol or drug abuse.  The history is provided by the patient and the EMS personnel.  Hyperglycemia  Associated symptoms: fatigue   Associated symptoms: no abdominal pain, no dizziness, no dysuria, no fever, no nausea, no shortness of breath, no vomiting and no weakness     Past Medical History:  Diagnosis Date  . Anxiety   . Anxiety   . Chronic lower back pain   . Depression   . DKA (diabetic ketoacidoses) ([place]) 07/30/2016  . Hyperlipemia   . Hypertension   . Migraine    "last one was ~ 4 yr ago" (12/23/2014)  . Seizures ([place])    "related to pills for anxiety; if I don't take the pills I'm suppose to take I'll have them" (12/23/2014)  . Type II diabetes mellitus [facility])     Patient Active Problem List   Diagnosis Date Noted  . AKI (acute kidney injury) ([place]) 03/04/2017  . Malnutrition of moderate degree 02/08/2017  . DKA (diabetic ketoacidoses) ([place]) 02/07/2017  . History of stroke 02/07/2017  . Hyperphosphatemia 12/13/2016  . Evaluation by psychiatric service required 11/30/2016  . Lactic acidosis 11/18/2016  . Normocytic anemia 11/06/2016  . HLD (hyperlipidemia) 11/06/2016  . Adjustment disorder with other symptoms 11/05/2016  . Uncontrolled type 2 diabetes mellitus with hyperosmolar nonketotic hyperglycemia ([place]) 08/24/2016    . Essential hypertension 08/24/2016  . Acute ischemic stroke ([place])   . Protein-calorie malnutrition, severe 12/24/2014  . Homelessness 12/23/2014  . Hypertensive urgency 12/23/2014  . DM (diabetes mellitus) ([place]) 12/23/2014  . Intermittent palpitations 12/23/2014  . Tobacco abuse 12/23/2014  . Pleuritic chest pain 12/23/2014  . Abnormal EKG 12/23/2014  . Type II diabetes mellitus with renal manifestations [facility])     Past Surgical History:  Procedure Laterality Date  . NO PAST SURGERIES          Home Medications    Prior to Admission medications   Medication Sig Start Date End Date Taking? Authorizing Provider  aspirin 325 MG tablet Take 1 tablet (325 mg total) by mouth daily. 03/08/17   Georgette Shell, MD  atorvastatin (LIPITOR) 40 MG tablet Take 1 tablet (40 mg total) by mouth daily. 01/26/17   Cardama, Grayce Sessions, MD  blood glucose meter kit and supplies KIT Dispense based on patient and insurance preference. Use up to four times daily as directed. (FOR ICD-9 250.00, 250.01). 11/22/16   Velvet Bathe, MD  ferrous sulfate 325 (65 FE) MG tablet Take 1 tablet (325 mg total) daily by mouth. Patient taking differently: Take 325 mg by mouth daily as needed (low iron).  11/23/16   Ward, Delice Bison, DO  insulin aspart protamine- aspart (NOVOLOG MIX 70/30) (70-30) 100 UNIT/ML injection Inject 0.3 mLs (30 Units total)  into the skin 2 (two) times daily with a meal. 04/30/17   Dorie Rank, MD  lisinopril (PRINIVIL,ZESTRIL) 40 MG tablet Take 1 tablet (40 mg total) by mouth daily. 04/30/17   Dorie Rank, MD  metoprolol tartrate (LOPRESSOR) 25 MG tablet Take 0.5 tablets (12.5 mg total) by mouth 2 (two) times daily. 03/07/17   Georgette Shell, MD    Family History Family History  Problem Relation Age of Onset  . Diabetes Mellitus II Mother   . Diabetes Mellitus II Father     Social History Social History   Tobacco Use  . Smoking status: Current Every Day Smoker    Packs/day:  0.10    Years: 40.00    Pack years: 4.00    Types: Cigarettes  . Smokeless tobacco: Never Used  Substance Use Topics  . Alcohol use: Yes    Alcohol/week: 1.2 oz    Types: 2 Cans of beer per week  . Drug use: No    Comment: 12/23/2014 "stopped ~ 10 yrs ago"     Allergies   Ibuprofen and Tylenol [acetaminophen]   Review of Systems Review of Systems  Constitutional: Positive for fatigue. Negative for activity change, appetite change and fever.  Respiratory: Negative for cough, chest tightness and shortness of breath.   Gastrointestinal: Negative for abdominal pain, nausea and vomiting.  Genitourinary: Negative for dysuria, frequency, hematuria and urgency.  Musculoskeletal: Negative for back pain.  Neurological: Negative for dizziness, weakness and headaches.   all other systems are negative except as noted in the HPI and PMH.     Physical Exam Updated Vital Signs BP (!) 180/87 (BP Location: Right Arm)   Pulse 82   Temp 98.8 F (37.1 C) (Oral)   Resp 18   SpO2 99%   Physical Exam  Constitutional: He is oriented to person, place, and time. He appears well-developed and well-nourished. No distress.  Disheveled, leaves and debris in hair  HENT:  Head: Normocephalic and atraumatic.  Mouth/Throat: Oropharynx is clear and moist. No oropharyngeal exudate.  Moist mucus membranes  Eyes: Pupils are equal, round, and reactive to light. Conjunctivae and EOM are normal.  Neck: Normal range of motion. Neck supple.  No meningismus.  Cardiovascular: Normal rate, regular rhythm, normal heart sounds and intact distal pulses.  No murmur heard. Pulmonary/Chest: Effort normal and breath sounds normal. No respiratory distress. He exhibits no tenderness.  Abdominal: Soft. There is no tenderness. There is no rebound and no guarding.  Musculoskeletal: Normal range of motion. He exhibits no edema or tenderness.  Neurological: He is alert and oriented to person, place, and time. No cranial  nerve deficit. He exhibits normal muscle tone. Coordination normal.  No ataxia on finger to nose bilaterally. No pronator drift. 5/5 strength throughout. CN 2-12 intact.Equal grip strength. Sensation intact.   Skin: Skin is warm. Capillary refill takes less than 2 seconds. No rash noted.  Psychiatric: He has a normal mood and affect. His behavior is normal.  Nursing note and vitals reviewed.    ED Treatments / Results  Labs (all labs ordered are listed, but only abnormal results are displayed) Labs Reviewed  CBC WITH DIFFERENTIAL/PLATELET - Abnormal; Notable for the following components:      Result Value   RBC 4.18 (*)    Hemoglobin 10.6 (*)    HCT 32.9 (*)    MCH 25.4 (*)    All other components within normal limits  BASIC METABOLIC PANEL - Abnormal; Notable for the following components:  Sodium 130 (*)    Chloride 92 (*)    Glucose, Bld 789 (*)    All other components within normal limits  URINALYSIS, ROUTINE W REFLEX MICROSCOPIC - Abnormal; Notable for the following components:   Color, Urine STRAW (*)    Glucose, UA >=500 (*)    Ketones, ur 5 (*)    All other components within normal limits  CBG MONITORING, ED - Abnormal; Notable for the following components:   Glucose-Capillary >600 (*)    All other components within normal limits  CBG MONITORING, ED - Abnormal; Notable for the following components:   Glucose-Capillary 232 (*)    All other components within normal limits  CBG MONITORING, ED - Abnormal; Notable for the following components:   Glucose-Capillary 219 (*)    All other components within normal limits  I-STAT CG4 LACTIC ACID, ED  I-STAT VENOUS BLOOD GAS, ED  I-STAT CG4 LACTIC ACID, ED    EKG None  Radiology No results found.  Procedures Procedures (including critical care time)  Medications Ordered in ED Medications  sodium chloride 0.9 % bolus 1,000 mL (has no administration in time range)  sodium chloride 0.9 % bolus 1,000 mL (has no  administration in time range)  insulin aspart (novoLOG) injection 10 Units (has no administration in time range)     Initial Impression / Assessment and Plan / ED Course  I have reviewed the triage vital signs and the nursing notes.  Pertinent labs & imaging results that were available during my care of the patient were reviewed by me and considered in my medical decision making (see chart for details).    Noncompliant diabetic presenting with recurrent hyperglycemia.  His vitals are stable.  He is in no distress.  Patient will be hydrated and given IV insulin.  Labs will be checked to rule out DKA.  Patient hyperglycemic without DKA.  Bicarb is 28.  Anion gap is 10.  Patient given IV fluids and insulin.  Suspect hyperglycemia due to noncompliance.  Blood sugar is improved on recheck.  Patient is tolerating p.o.  Follow-up with PCP.  Return precautions discussed.  Final Clinical Impressions(s) / ED Diagnoses   Final diagnoses:  Hyperglycemia    ED Discharge Orders    None       Lurie Mullane, Annie Main, MD 05/20/17 [phone]

## 2017-05-19 NOTE — ED Notes (Signed)
Checked CBG meter reads High. RN Casimiro Needle informed

## 2017-05-19 NOTE — Discharge Instructions (Addendum)
AVOIDS SWEETS.

## 2017-05-19 NOTE — ED Triage Notes (Signed)
Pt arrived via [facility]. Pt found to be hyperglycemic. Pt has hx of the same. Pt urinated on himself PTA. Pt is alert and responding to commands. VSS upon arrival.

## 2017-05-20 LAB — BASIC METABOLIC PANEL
ANION GAP: 10 (ref 5–15)
BUN: 16 mg/dL (ref 6–20)
CALCIUM: 8.9 mg/dL (ref 8.9–10.3)
CO2: 28 mmol/L (ref 22–32)
Chloride: 92 mmol/L — ABNORMAL LOW (ref 101–111)
Creatinine, Ser: 1.23 mg/dL (ref 0.61–1.24)
GFR calc non Af Amer: >60 mL/min (ref >60)
GLUCOSE: 789 mg/dL — AB (ref 65–99)
POTASSIUM: 4.2 mmol/L (ref 3.5–5.1)
Sodium: 130 mmol/L — ABNORMAL LOW (ref 135–145)

## 2017-05-20 LAB — CBG MONITORING, ED
GLUCOSE-CAPILLARY: 219 mg/dL — AB (ref 65–99)
Glucose-Capillary: 232 mg/dL — ABNORMAL HIGH (ref 65–99)

## 2017-05-20 MED ORDER — INSULIN ASPART 100 UNIT/ML ~~LOC~~ SOLN
15.0000 [IU] | Freq: Once | SUBCUTANEOUS | Status: AC
  Administered 2017-05-20: 15 [IU] via INTRAVENOUS
  Filled 2017-05-20: qty 1

## 2017-05-20 NOTE — Discharge Instructions (Addendum)
Take your medications as prescribed. Follow up with your doctor. Return to the ED if you develop new or worsening symptoms.  °

## 2017-05-21 ENCOUNTER — Emergency Department (HOSPITAL_COMMUNITY)
Admission: EM | Admit: 2017-05-21 | Discharge: 2017-05-22 | Disposition: A | Discharge status: Home / Self Care | Payer: Self-pay | Attending: Emergency Medicine | Admitting: Emergency Medicine

## 2017-05-21 ENCOUNTER — Encounter (HOSPITAL_COMMUNITY): Payer: Self-pay

## 2017-05-21 ENCOUNTER — Other Ambulatory Visit: Payer: Self-pay

## 2017-05-21 ENCOUNTER — Emergency Department (HOSPITAL_COMMUNITY)
Admission: EM | Admit: 2017-05-21 | Discharge: 2017-05-21 | Disposition: A | Discharge status: Home / Self Care | Payer: Self-pay | Attending: Emergency Medicine | Admitting: Emergency Medicine

## 2017-05-21 DIAGNOSIS — I10 Essential (primary) hypertension: Secondary | ICD-10-CM | POA: Insufficient documentation

## 2017-05-21 DIAGNOSIS — Z79899 Other long term (current) drug therapy: Secondary | ICD-10-CM | POA: Insufficient documentation

## 2017-05-21 DIAGNOSIS — F17210 Nicotine dependence, cigarettes, uncomplicated: Secondary | ICD-10-CM | POA: Insufficient documentation

## 2017-05-21 DIAGNOSIS — E119 Type 2 diabetes mellitus without complications: Secondary | ICD-10-CM | POA: Insufficient documentation

## 2017-05-21 DIAGNOSIS — Z5900 Homelessness unspecified: Secondary | ICD-10-CM

## 2017-05-21 DIAGNOSIS — R739 Hyperglycemia, unspecified: Secondary | ICD-10-CM | POA: Insufficient documentation

## 2017-05-21 DIAGNOSIS — Z5321 Procedure and treatment not carried out due to patient leaving prior to being seen by health care provider: Secondary | ICD-10-CM | POA: Insufficient documentation

## 2017-05-21 DIAGNOSIS — Z7982 Long term (current) use of aspirin: Secondary | ICD-10-CM | POA: Insufficient documentation

## 2017-05-21 LAB — I-STAT CHEM 8, ED
BUN: 11 mg/dL (ref 6–20)
BUN: 8 mg/dL (ref 6–20)
CREATININE: 0.7 mg/dL (ref 0.61–1.24)
Calcium, Ion: 1.17 mmol/L (ref 1.15–1.40)
Calcium, Ion: 1.21 mmol/L (ref 1.15–1.40)
Chloride: 97 mmol/L — ABNORMAL LOW (ref 101–111)
Chloride: 94 mmol/L — ABNORMAL LOW (ref 101–111)
Creatinine, Ser: 0.6 mg/dL — ABNORMAL LOW (ref 0.61–1.24)
Glucose, Bld: 528 mg/dL (ref 65–99)
Glucose, Bld: 285 mg/dL — ABNORMAL HIGH (ref 65–99)
HCT: 35 % — ABNORMAL LOW (ref 39.0–52.0)
HEMATOCRIT: 30 % — AB (ref 39.0–52.0)
HEMOGLOBIN: 11.9 g/dL — AB (ref 13.0–17.0)
Hemoglobin: 10.2 g/dL — ABNORMAL LOW (ref 13.0–17.0)
POTASSIUM: 4.2 mmol/L (ref 3.5–5.1)
Potassium: 3.3 mmol/L — ABNORMAL LOW (ref 3.5–5.1)
SODIUM: 131 mmol/L — AB (ref 135–145)
Sodium: 133 mmol/L — ABNORMAL LOW (ref 135–145)
TCO2: 26 mmol/L (ref 22–32)
TCO2: 27 mmol/L (ref 22–32)

## 2017-05-21 LAB — CBG MONITORING, ED
Glucose-Capillary: 501 mg/dL (ref 65–99)
Glucose-Capillary: 466 mg/dL — ABNORMAL HIGH (ref 65–99)

## 2017-05-21 MED ORDER — INSULIN ASPART 100 UNIT/ML ~~LOC~~ SOLN
8.0000 [IU] | Freq: Once | SUBCUTANEOUS | Status: AC
  Administered 2017-05-21: 8 [IU] via SUBCUTANEOUS
  Filled 2017-05-21: qty 1

## 2017-05-21 MED ORDER — INSULIN ASPART 100 UNIT/ML ~~LOC~~ SOLN
10.0000 [IU] | Freq: Once | SUBCUTANEOUS | Status: AC
  Administered 2017-05-21: 10 [IU] via SUBCUTANEOUS
  Filled 2017-05-21: qty 1

## 2017-05-21 NOTE — ED Triage Notes (Signed)
Per [facility], pt @ [employer] c/o elevated blood sugar.  Pt ambulatory.

## 2017-05-21 NOTE — ED Triage Notes (Addendum)
Per EMS- patient walked into a [employer] [place] and stated that he needed EMS for his blood sugar. Patient waited outside of the [facility] on a bench until EMS arrived. CBG-289. BP-180/80, HR-80.

## 2017-05-21 NOTE — ED Notes (Signed)
Dr Rubin Payor notified of patient's blood sugar.

## 2017-05-21 NOTE — ED Notes (Signed)
Bed: WLPT3 Expected date:  Expected time:  Means of arrival:  Comments: 

## 2017-05-21 NOTE — ED Triage Notes (Signed)
B/p 174/80, P 79, R 18, 98%, CBG 545.

## 2017-05-21 NOTE — ED Provider Notes (Signed)
[place] DEPT Provider Note   CSN: [id number] Arrival date & time: 05/21/17  9622     History   Chief Complaint Chief Complaint  Patient presents with  . Hyperglycemia    HPI Mike [name] is a 57 y.o. male. Level 5 caveat due to uncooperativeness. HPI Patient presents from hyperglycemia.  Really will not provide me much history.  Reportedly [place] the gas [place].  This is his 66th visit in the last 6 months.  Has history of noncompliance.  Patient states he has been taking his medicine but has not been eating well. Past Medical History:  Diagnosis Date  . Anxiety   . Anxiety   . Chronic lower back pain   . Depression   . DKA (diabetic ketoacidoses) ([place]) 07/30/2016  . Hyperlipemia   . Hypertension   . Migraine    "last one was ~ 4 yr ago" (12/23/2014)  . Seizures ([place])    "related to pills for anxiety; if I don't take the pills I'm suppose to take I'll have them" (12/23/2014)  . Type II diabetes mellitus [facility])     Patient Active Problem List   Diagnosis Date Noted  . AKI (acute kidney injury) ([place]) 03/04/2017  . Malnutrition of moderate degree 02/08/2017  . DKA (diabetic ketoacidoses) ([place]) 02/07/2017  . History of stroke 02/07/2017  . Hyperphosphatemia 12/13/2016  . Evaluation by psychiatric service required 11/30/2016  . Lactic acidosis 11/18/2016  . Normocytic anemia 11/06/2016  . HLD (hyperlipidemia) 11/06/2016  . Adjustment disorder with other symptoms 11/05/2016  . Uncontrolled type 2 diabetes mellitus with hyperosmolar nonketotic hyperglycemia ([place]) 08/24/2016  . Essential hypertension 08/24/2016  . Acute ischemic stroke ([place])   . Protein-calorie malnutrition, severe 12/24/2014  . Homelessness 12/23/2014  . Hypertensive urgency 12/23/2014  . DM (diabetes mellitus) ([place]) 12/23/2014  . Intermittent palpitations 12/23/2014  . Tobacco abuse 12/23/2014  . Pleuritic chest pain 12/23/2014  . Abnormal EKG 12/23/2014   . Type II diabetes mellitus with renal manifestations [facility])     Past Surgical History:  Procedure Laterality Date  . NO PAST SURGERIES          Home Medications    Prior to Admission medications   Medication Sig Start Date End Date Taking? Authorizing Provider  aspirin 325 MG tablet Take 1 tablet (325 mg total) by mouth daily. 03/08/17   Georgette Shell, MD  atorvastatin (LIPITOR) 40 MG tablet Take 1 tablet (40 mg total) by mouth daily. 01/26/17   Cardama, Grayce Sessions, MD  blood glucose meter kit and supplies KIT Dispense based on patient and insurance preference. Use up to four times daily as directed. (FOR ICD-9 250.00, 250.01). 11/22/16   Velvet Bathe, MD  ferrous sulfate 325 (65 FE) MG tablet Take 1 tablet (325 mg total) daily by mouth. Patient taking differently: Take 325 mg by mouth daily as needed (low iron).  11/23/16   Ward, Delice Bison, DO  insulin aspart protamine- aspart (NOVOLOG MIX 70/30) (70-30) 100 UNIT/ML injection Inject 0.3 mLs (30 Units total) into the skin 2 (two) times daily with a meal. 04/30/17   Dorie Rank, MD  lisinopril (PRINIVIL,ZESTRIL) 40 MG tablet Take 1 tablet (40 mg total) by mouth daily. 04/30/17   Dorie Rank, MD  metoprolol tartrate (LOPRESSOR) 25 MG tablet Take 0.5 tablets (12.5 mg total) by mouth 2 (two) times daily. 03/07/17   Georgette Shell, MD    Family History Family History  Problem Relation Age of Onset  .  Diabetes Mellitus II Mother   . Diabetes Mellitus II Father     Social History Social History   Tobacco Use  . Smoking status: Current Every Day Smoker    Packs/day: 0.10    Years: 40.00    Pack years: 4.00    Types: Cigarettes  . Smokeless tobacco: Never Used  Substance Use Topics  . Alcohol use: Yes    Alcohol/week: 1.2 oz    Types: 2 Cans of beer per week  . Drug use: No    Comment: 12/23/2014 "stopped ~ 10 yrs ago"     Allergies   Ibuprofen and Tylenol [acetaminophen]   Review of Systems Review of Systems    Unable to perform ROS: Psychiatric disorder     Physical Exam Updated Vital Signs BP (!) 157/94 (BP Location: Left Arm)   Pulse 78   Temp 98.2 F (36.8 C) (Oral)   Resp 18   Ht _0  (1.727 m)   Wt 77.1 kg (170 lb)   SpO2 100%   BMI 25.85 kg/m   Physical Exam  Constitutional:  Patient is disheveled  HENT:  Head: Atraumatic.  Eyes: Right eye exhibits no discharge. Left eye exhibits no discharge.  Neck: Neck supple.  Cardiovascular: Normal rate.  Pulmonary/Chest: Effort normal.  Abdominal: There is no tenderness.  Neurological:  Patient is laying in the chair.  Will awaken to stimulation.  Will nod yes or no to questions but otherwise will not participate very much in the history.  Skin: Skin is warm.     ED Treatments / Results  Labs (all labs ordered are listed, but only abnormal results are displayed) Labs Reviewed  I-STAT CHEM 8, ED - Abnormal; Notable for the following components:      Result Value   Sodium 133 (*)    Chloride 97 (*)    Glucose, Bld 528 (*)    Hemoglobin 11.9 (*)    HCT 35.0 (*)    All other components within normal limits  CBG MONITORING, ED - Abnormal; Notable for the following components:   Glucose-Capillary 501 (*)    All other components within normal limits  CBG MONITORING, ED - Abnormal; Notable for the following components:   Glucose-Capillary 466 (*)    All other components within normal limits    EKG None  Radiology No results found.  Procedures Procedures (including critical care time)  Medications Ordered in ED Medications  insulin aspart (novoLOG) injection 8 Units (8 Units Subcutaneous Given 05/21/17 0912)  insulin aspart (novoLOG) injection 10 Units (10 Units Subcutaneous Given 05/21/17 1031)     Initial Impression / Assessment and Plan / ED Course  I have reviewed the triage vital signs and the nursing notes.  Pertinent labs & imaging results that were available during my care of the patient were reviewed by me  and considered in my medical decision making (see chart for details).     Patient with hyperglycemia. non-DKA.  Frequent visits for same.  Noncompliant.  Has had 2 doses of insulin.  Sugar down in the 400s now.  Will discharge.  Patient's care plan reviewed.  Final Clinical Impressions(s) / ED Diagnoses   Final diagnoses:  Hyperglycemia  Homelessness    ED Discharge Orders    None       Davonna Belling, MD 05/21/17 1205

## 2017-05-22 ENCOUNTER — Encounter (HOSPITAL_COMMUNITY): Payer: Self-pay

## 2017-05-22 ENCOUNTER — Emergency Department (HOSPITAL_COMMUNITY)
Admission: EM | Admit: 2017-05-22 | Discharge: 2017-05-23 | Disposition: A | Discharge status: Home / Self Care | Payer: Self-pay | Attending: Emergency Medicine | Admitting: Emergency Medicine

## 2017-05-22 ENCOUNTER — Other Ambulatory Visit: Payer: Self-pay

## 2017-05-22 DIAGNOSIS — I10 Essential (primary) hypertension: Secondary | ICD-10-CM | POA: Insufficient documentation

## 2017-05-22 DIAGNOSIS — Z794 Long term (current) use of insulin: Secondary | ICD-10-CM | POA: Insufficient documentation

## 2017-05-22 DIAGNOSIS — Z8673 Personal history of transient ischemic attack (TIA), and cerebral infarction without residual deficits: Secondary | ICD-10-CM | POA: Insufficient documentation

## 2017-05-22 DIAGNOSIS — E1165 Type 2 diabetes mellitus with hyperglycemia: Secondary | ICD-10-CM | POA: Insufficient documentation

## 2017-05-22 DIAGNOSIS — R739 Hyperglycemia, unspecified: Secondary | ICD-10-CM

## 2017-05-22 DIAGNOSIS — F17210 Nicotine dependence, cigarettes, uncomplicated: Secondary | ICD-10-CM | POA: Insufficient documentation

## 2017-05-22 DIAGNOSIS — Z79899 Other long term (current) drug therapy: Secondary | ICD-10-CM | POA: Insufficient documentation

## 2017-05-22 DIAGNOSIS — Z7982 Long term (current) use of aspirin: Secondary | ICD-10-CM | POA: Insufficient documentation

## 2017-05-22 LAB — I-STAT CHEM 8, ED
BUN: 20 mg/dL (ref 6–20)
Calcium, Ion: 1.21 mmol/L (ref 1.15–1.40)
Chloride: 92 mmol/L — ABNORMAL LOW (ref 101–111)
Creatinine, Ser: 1.1 mg/dL (ref 0.61–1.24)
Glucose, Bld: >700 mg/dL (ref 65–99)
HCT: 36 % — ABNORMAL LOW (ref 39.0–52.0)
Hemoglobin: 12.2 g/dL — ABNORMAL LOW (ref 13.0–17.0)
Potassium: 4.4 mmol/L (ref 3.5–5.1)
Sodium: 130 mmol/L — ABNORMAL LOW (ref 135–145)
TCO2: 31 mmol/L (ref 22–32)

## 2017-05-22 LAB — CBG MONITORING, ED
Glucose-Capillary: >600 mg/dL (ref 65–99)
Glucose-Capillary: >600 mg/dL (ref 65–99)

## 2017-05-22 MED ORDER — INSULIN ASPART PROT & ASPART (70-30 MIX) 100 UNIT/ML ~~LOC~~ SUSP
10.0000 [IU] | Freq: Once | SUBCUTANEOUS | Status: AC
  Administered 2017-05-22: 10 [IU] via SUBCUTANEOUS
  Filled 2017-05-22: qty 10

## 2017-05-22 MED ORDER — SODIUM CHLORIDE 0.9 % IV BOLUS
1000.0000 mL | Freq: Once | INTRAVENOUS | Status: AC
  Administered 2017-05-23: 1000 mL via INTRAVENOUS

## 2017-05-22 MED ORDER — INSULIN ASPART PROT & ASPART (70-30 MIX) 100 UNIT/ML ~~LOC~~ SUSP: 10.0000 [IU] | Freq: Once | SUBCUTANEOUS | Status: DC

## 2017-05-22 MED ORDER — INSULIN ASPART PROT & ASPART (70-30 MIX) 100 UNIT/ML ~~LOC~~ SUSP
8.0000 [IU] | Freq: Once | SUBCUTANEOUS | Status: AC
  Administered 2017-05-23: 8 [IU] via SUBCUTANEOUS
  Filled 2017-05-22: qty 10

## 2017-05-22 NOTE — ED Provider Notes (Signed)
[place] DEPT Provider Note   CSN: [id number] Arrival date & time: 05/22/17  2042     History   Chief Complaint Chief Complaint  Patient presents with  . Hyperglycemia    HPI Mike [name] is a 57 y.o. male with history of insulin-dependent type 2 diabetes, hypertension, hyperlipidemia, depression, anxiety presents for evaluation of "high blood sugars ".  He is well-known to the ED and notoriously noncompliant with his home medicines.  He does tell me that he did take his home medicines this morning and also tells me that he has not had anything to eat or drink today.  He tells me this every time I see him.  he denies any other complaints.   The history is provided by the patient.    Past Medical History:  Diagnosis Date  . Anxiety   . Anxiety   . Chronic lower back pain   . Depression   . DKA (diabetic ketoacidoses) ([place]) 07/30/2016  . Hyperlipemia   . Hypertension   . Migraine    "last one was ~ 4 yr ago" (12/23/2014)  . Seizures ([place])    "related to pills for anxiety; if I don't take the pills I'm suppose to take I'll have them" (12/23/2014)  . Type II diabetes mellitus [facility])     Patient Active Problem List   Diagnosis Date Noted  . AKI (acute kidney injury) ([place]) 03/04/2017  . Malnutrition of moderate degree 02/08/2017  . DKA (diabetic ketoacidoses) ([place]) 02/07/2017  . History of stroke 02/07/2017  . Hyperphosphatemia 12/13/2016  . Evaluation by psychiatric service required 11/30/2016  . Lactic acidosis 11/18/2016  . Normocytic anemia 11/06/2016  . HLD (hyperlipidemia) 11/06/2016  . Adjustment disorder with other symptoms 11/05/2016  . Uncontrolled type 2 diabetes mellitus with hyperosmolar nonketotic hyperglycemia ([place]) 08/24/2016  . Essential hypertension 08/24/2016  . Acute ischemic stroke ([place])   . Protein-calorie malnutrition, severe 12/24/2014  . Homelessness 12/23/2014  . Hypertensive urgency 12/23/2014  . DM  (diabetes mellitus) ([place]) 12/23/2014  . Intermittent palpitations 12/23/2014  . Tobacco abuse 12/23/2014  . Pleuritic chest pain 12/23/2014  . Abnormal EKG 12/23/2014  . Type II diabetes mellitus with renal manifestations [facility])     Past Surgical History:  Procedure Laterality Date  . NO PAST SURGERIES          Home Medications    Prior to Admission medications   Medication Sig Start Date End Date Taking? Authorizing Provider  aspirin 325 MG tablet Take 1 tablet (325 mg total) by mouth daily. 03/08/17  Yes Georgette Shell, MD  atorvastatin (LIPITOR) 40 MG tablet Take 1 tablet (40 mg total) by mouth daily. 01/26/17  Yes Cardama, Grayce Sessions, MD  ferrous sulfate 325 (65 FE) MG tablet Take 1 tablet (325 mg total) daily by mouth. Patient taking differently: Take 325 mg by mouth daily as needed (low iron).  11/23/16  Yes Ward, Cyril Mourning N, DO  insulin aspart protamine- aspart (NOVOLOG MIX 70/30) (70-30) 100 UNIT/ML injection Inject 0.3 mLs (30 Units total) into the skin 2 (two) times daily with a meal. 04/30/17  Yes Dorie Rank, MD  lisinopril (PRINIVIL,ZESTRIL) 40 MG tablet Take 1 tablet (40 mg total) by mouth daily. 04/30/17  Yes Dorie Rank, MD  metoprolol tartrate (LOPRESSOR) 25 MG tablet Take 0.5 tablets (12.5 mg total) by mouth 2 (two) times daily. 03/07/17  Yes Georgette Shell, MD  blood glucose meter kit and supplies KIT Dispense based on patient and insurance  preference. Use up to four times daily as directed. (FOR ICD-9 250.00, 250.01). 11/22/16   Velvet Bathe, MD    Family History Family History  Problem Relation Age of Onset  . Diabetes Mellitus II Mother   . Diabetes Mellitus II Father     Social History Social History   Tobacco Use  . Smoking status: Current Every Day Smoker    Packs/day: 0.10    Years: 40.00    Pack years: 4.00    Types: Cigarettes  . Smokeless tobacco: Never Used  Substance Use Topics  . Alcohol use: Yes    Alcohol/week: 1.2 oz    Types:  2 Cans of beer per week  . Drug use: No    Comment: 12/23/2014 "stopped ~ 10 yrs ago"     Allergies   Ibuprofen and Tylenol [acetaminophen]   Review of Systems Review of Systems  Constitutional: Negative for chills and fever.  Respiratory: Negative for shortness of breath.   Cardiovascular: Negative for chest pain.  Gastrointestinal: Negative for abdominal pain, nausea and vomiting.  Endocrine: Positive for polyuria.  All other systems reviewed and are negative.    Physical Exam Updated Vital Signs BP (!) 151/66 (BP Location: Right Arm)   Pulse 80   Temp 99.2 F (37.3 C) (Oral)   Resp 16   Ht '5\' 11"'$  (1.803 m)   Wt 77.1 kg (170 lb)   SpO2 96%   BMI 23.71 kg/m    Physical Exam  Constitutional: He appears well-developed and well-nourished. No distress.  Generally disheveled and unkempt in appearance  HENT:  Head: Normocephalic and atraumatic.  Poor dentition  Eyes: Conjunctivae are normal. Right eye exhibits no discharge. Left eye exhibits no discharge.  Neck: Normal range of motion. Neck supple. No JVD present. No tracheal deviation present.  Cardiovascular: Normal rate.  Pulmonary/Chest: Effort normal.  Abdominal: He exhibits no distension.  Musculoskeletal: He exhibits no edema.  Neurological: He is alert.  Slow to answer questions. This appears to be patient's baseline  Skin: Skin is warm and dry. No erythema.  Psychiatric: He has a normal mood and affect. His behavior is normal.  Nursing note and vitals reviewed.    ED Treatments / Results  Labs (all labs ordered are listed, but only abnormal results are displayed) Labs Reviewed  CBG MONITORING, ED - Abnormal; Notable for the following components:      Result Value   Glucose-Capillary >600 (*)    All other components within normal limits  I-STAT CHEM 8, ED - Abnormal; Notable for the following components:   Sodium 130 (*)    Chloride 92 (*)    Glucose, Bld >700 (*)    Hemoglobin 12.2 (*)    HCT 36.0  (*)    All other components within normal limits  CBG MONITORING, ED    EKG None  Radiology No results found.  Procedures Procedures (including critical care time)  Medications Ordered in ED Medications  insulin aspart protamine- aspart (NOVOLOG MIX 70/30) injection 10 Units (10 Units Subcutaneous Given 05/22/17 2303)     Initial Impression / Assessment and Plan / ED Course  I have reviewed the triage vital signs and the nursing notes.  Pertinent labs & imaging results that were available during my care of the patient were reviewed by me and considered in my medical decision making (see chart for details).     Patient well-known to the ED presents today for evaluation of hyperglycemia.  He has been seen 46  times in the past 6 months for similar complaints.He is afebrile, vital signs are stable.  He is nontoxic in appearance. Labs today show elevated blood glucose, calculated anion gap of 7.  Patient is not currently in DKA by definition. Will give subq insulin and reassess. Of note, apparently the patient was eating something in his cost pocket while in the ED today.  11:38 PM CBG still reading HIGH. Will give 8 units sq insulin, IV fluids, and reassess.  11:44 PM Signed out to oncoming provider PA Joy. He will reassess patient's CBG after repeat insulin dose and fluids. Patient likely stable for discharge.    Final Clinical Impressions(s) / ED Diagnoses   Final diagnoses:  Hyperglycemia    ED Discharge Orders    None      Renita Papa, PA-C 05/22/17 2349  Isla Pence, MD 05/22/17 2351

## 2017-05-22 NOTE — ED Notes (Addendum)
CBG done with [facility] PA at bedside. CBG reading showing HI Mina PA to order additional Insulin at this time.

## 2017-05-22 NOTE — ED Provider Notes (Signed)
Mike [name] is a 57 y.o. male, presenting to the ED with hyperglycemia.   HPI from [facility], PA-C: "Mike [name] is a 57 y.o. male with history of insulin-dependent type 2 diabetes, hypertension, hyperlipidemia, depression, anxiety presents for evaluation of "high blood sugars ".  He is well-known to the ED and notoriously noncompliant with his home medicines.  He does tell me that he did take his home medicines this morning and also tells me that he has not had anything to eat or drink today.  He tells me this every time I see him.  he denies any other complaints."   Physical Exam  BP (!) 151/66 (BP Location: Right Arm)   Pulse 80   Temp 99.2 F (37.3 C) (Oral)   Resp 16   Ht  (1.803 m)   Wt 77.1 kg (170 lb)   SpO2 96%   BMI 23.71 kg/m   Physical Exam  Constitutional: He is oriented to person, place, and time. He appears well-developed and well-nourished. No distress.  Disheveled appearance  HENT:  Head: Normocephalic and atraumatic.  Eyes: Conjunctivae are normal.  Neck: Neck supple.  Cardiovascular: Normal rate, regular rhythm, normal heart sounds and intact distal pulses.  Pulmonary/Chest: Effort normal and breath sounds normal. No respiratory distress.  Abdominal: Soft. There is no tenderness. There is no guarding.  Musculoskeletal: He exhibits no edema.  Lymphadenopathy:    He has no cervical adenopathy.  Neurological: He is alert and oriented to person, place, and time.  Skin: Skin is warm and dry. He is not diaphoretic.  Psychiatric: He has a normal mood and affect. His behavior is normal.  Nursing note and vitals reviewed.   ED Course/Procedures     Procedures   Results for orders placed or performed during the hospital encounter of 05/22/17  Basic metabolic panel  Result Value Ref Range   Sodium 134 (L) 135 - 145 mmol/L   Potassium 4.2 3.5 - 5.1 mmol/L   Chloride 96 (L) 101 - 111 mmol/L   CO2 28 22 - 32 mmol/L   Glucose, Bld 760 (HH) 65 -  99 mg/dL   BUN 18 6 - 20 mg/dL   Creatinine, Ser 9.14 0.61 - 1.24 mg/dL   Calcium 9.2 8.9 - 78.2 mg/dL   GFR calc non Af Amer >60 >60 mL/min   GFR calc Af Amer >60 >60 mL/min   Anion gap 10 5 - 15  CBC with Differential  Result Value Ref Range   WBC 4.6 4.0 - 10.5 K/uL   RBC 3.80 (L) 4.22 - 5.81 MIL/uL   Hemoglobin 9.9 (L) 13.0 - 17.0 g/dL   HCT 95.6 (L) 21.3 - 08.6 %   MCV 80.5 78.0 - 100.0 fL   MCH 26.1 26.0 - 34.0 pg   MCHC 32.4 30.0 - 36.0 g/dL   RDW 57.8 46.9 - 62.9 %   Platelets 329 150 - 400 K/uL   Neutrophils Relative % 49 %   Neutro Abs 2.3 1.7 - 7.7 K/uL   Lymphocytes Relative 40 %   Lymphs Abs 1.8 0.7 - 4.0 K/uL   Monocytes Relative 10 %   Monocytes Absolute 0.4 0.1 - 1.0 K/uL   Eosinophils Relative 1 %   Eosinophils Absolute 0.1 0.0 - 0.7 K/uL   Basophils Relative 0 %   Basophils Absolute 0.0 0.0 - 0.1 K/uL  CBG monitoring, ED  Result Value Ref Range   Glucose-Capillary >600 (HH) 65 - 99 mg/dL   Comment 1  Notify RN    Comment 2 Document in Chart   I-stat Chem 8, ED  Result Value Ref Range   Sodium 130 (L) 135 - 145 mmol/L   Potassium 4.4 3.5 - 5.1 mmol/L   Chloride 92 (L) 101 - 111 mmol/L   BUN 20 6 - 20 mg/dL   Creatinine, Ser 1.61 0.61 - 1.24 mg/dL   Glucose, Bld >096 (HH) 65 - 99 mg/dL   Calcium, Ion 0.45 4.09 - 1.40 mmol/L   TCO2 31 22 - 32 mmol/L   Hemoglobin 12.2 (L) 13.0 - 17.0 g/dL   HCT 81.1 (L) 91.4 - 78.2 %   Comment NOTIFIED PHYSICIAN   POC CBG, ED  Result Value Ref Range   Glucose-Capillary >600 (HH) 65 - 99 mg/dL  POC CBG, ED  Result Value Ref Range   Glucose-Capillary 458 (H) 65 - 99 mg/dL   No results found.  MDM   Patient care handoff report received from [facility], PA-C.  Patient presents with hyperglycemia.  Initial glucose 760 without anion gap, no ketoacidosis.  No noted confusion.  Doubt HHS.  Blood glucose responded well to interventions here in the ED.  CBG prior to last dose of insulin 458, which would be consistent with  previous values for this patient.  Recommend PCP follow-up. The patient was given instructions for home care as well as return precautions. Patient voices understanding of these instructions, accepts the plan, and is comfortable with discharge.  Findings and plan of care discussed with Dione Booze, MD.   Vitals:   05/22/17 2102 05/22/17 2301 05/23/17 0043 05/23/17 0240  BP: (!) 151/66  (!) 153/76 (!) 146/89  Pulse: 80  66 72  Resp: Temp: 99.2 F (37.3 C)     TempSrc: Oral     SpO2: 96%  99% 100%  Weight:  77.1 kg (170 lb)    Height:   (1.803 m)         Anselm Pancoast, PA-C 05/23/17 0339    Dione Booze, MD 05/23/17 [phone]

## 2017-05-22 NOTE — ED Notes (Signed)
Patient seen eating in triage room. Informed patient he is not to be eating at this time. PA notified.

## 2017-05-22 NOTE — ED Notes (Signed)
I gave critical I Stat Chem 8 result to PA [facility]

## 2017-05-22 NOTE — Discharge Instructions (Signed)
Continue to take your home medications as prescribed. Return to the emergency department in the event of an emergency.

## 2017-05-22 NOTE — ED Notes (Signed)
Bed: WTR6 Expected date:  Expected time:  Means of arrival:  Comments: 

## 2017-05-22 NOTE — ED Triage Notes (Signed)
Pt came in by EMS with an elevated blood sugar

## 2017-05-22 NOTE — ED Triage Notes (Signed)
Pt not seen in the lobby  

## 2017-05-23 ENCOUNTER — Emergency Department (HOSPITAL_COMMUNITY)
Admission: EM | Admit: 2017-05-23 | Discharge: 2017-05-23 | Disposition: A | Discharge status: Home / Self Care | Payer: Self-pay | Attending: Emergency Medicine | Admitting: Emergency Medicine

## 2017-05-23 ENCOUNTER — Other Ambulatory Visit: Payer: Self-pay

## 2017-05-23 ENCOUNTER — Encounter (HOSPITAL_COMMUNITY): Payer: Self-pay | Admitting: Emergency Medicine

## 2017-05-23 DIAGNOSIS — I10 Essential (primary) hypertension: Secondary | ICD-10-CM | POA: Insufficient documentation

## 2017-05-23 DIAGNOSIS — E1165 Type 2 diabetes mellitus with hyperglycemia: Secondary | ICD-10-CM | POA: Insufficient documentation

## 2017-05-23 DIAGNOSIS — Z79899 Other long term (current) drug therapy: Secondary | ICD-10-CM | POA: Insufficient documentation

## 2017-05-23 DIAGNOSIS — Z8673 Personal history of transient ischemic attack (TIA), and cerebral infarction without residual deficits: Secondary | ICD-10-CM | POA: Insufficient documentation

## 2017-05-23 DIAGNOSIS — R739 Hyperglycemia, unspecified: Secondary | ICD-10-CM

## 2017-05-23 DIAGNOSIS — Z7982 Long term (current) use of aspirin: Secondary | ICD-10-CM | POA: Insufficient documentation

## 2017-05-23 DIAGNOSIS — Z794 Long term (current) use of insulin: Secondary | ICD-10-CM | POA: Insufficient documentation

## 2017-05-23 DIAGNOSIS — F17210 Nicotine dependence, cigarettes, uncomplicated: Secondary | ICD-10-CM | POA: Insufficient documentation

## 2017-05-23 LAB — CBC WITH DIFFERENTIAL/PLATELET
BASOS ABS: 0 10*3/uL (ref 0.0–0.1)
Basophils Absolute: 0 10*3/uL (ref 0.0–0.1)
Basophils Relative: 0 %
Basophils Relative: 0 %
EOS PCT: 1 %
EOS PCT: 1 %
Eosinophils Absolute: 0.1 10*3/uL (ref 0.0–0.7)
Eosinophils Absolute: 0.1 10*3/uL (ref 0.0–0.7)
HCT: 29.6 % — ABNORMAL LOW (ref 39.0–52.0)
HEMATOCRIT: 30.6 % — AB (ref 39.0–52.0)
Hemoglobin: 9.4 g/dL — ABNORMAL LOW (ref 13.0–17.0)
Hemoglobin: 9.9 g/dL — ABNORMAL LOW (ref 13.0–17.0)
LYMPHS ABS: 1.8 10*3/uL (ref 0.7–4.0)
LYMPHS ABS: 2.1 10*3/uL (ref 0.7–4.0)
LYMPHS PCT: 37 %
LYMPHS PCT: 40 %
MCH: 25.2 pg — AB (ref 26.0–34.0)
MCH: 26.1 pg (ref 26.0–34.0)
MCHC: 31.8 g/dL (ref 30.0–36.0)
MCHC: 32.4 g/dL (ref 30.0–36.0)
MCV: 79.4 fL (ref 78.0–100.0)
MCV: 80.5 fL (ref 78.0–100.0)
MONO ABS: 0.3 10*3/uL (ref 0.1–1.0)
MONOS PCT: 6 %
Monocytes Absolute: 0.4 10*3/uL (ref 0.1–1.0)
Monocytes Relative: 10 %
Neutro Abs: 2.3 10*3/uL (ref 1.7–7.7)
Neutro Abs: 3.1 10*3/uL (ref 1.7–7.7)
Neutrophils Relative %: 49 %
Neutrophils Relative %: 56 %
PLATELETS: 291 10*3/uL (ref 150–400)
PLATELETS: 329 10*3/uL (ref 150–400)
RBC: 3.73 MIL/uL — ABNORMAL LOW (ref 4.22–5.81)
RBC: 3.8 MIL/uL — AB (ref 4.22–5.81)
RDW: 13.4 % (ref 11.5–15.5)
RDW: 13.6 % (ref 11.5–15.5)
WBC: 4.6 10*3/uL (ref 4.0–10.5)
WBC: 5.6 10*3/uL (ref 4.0–10.5)

## 2017-05-23 LAB — CBG MONITORING, ED
GLUCOSE-CAPILLARY: 515 mg/dL — AB (ref 65–99)
Glucose-Capillary: 458 mg/dL — ABNORMAL HIGH (ref 65–99)
Glucose-Capillary: >600 mg/dL (ref 65–99)

## 2017-05-23 LAB — COMPREHENSIVE METABOLIC PANEL
ALT: 24 U/L (ref 17–63)
ANION GAP: 10 (ref 5–15)
AST: 20 U/L (ref 15–41)
Albumin: 3.1 g/dL — ABNORMAL LOW (ref 3.5–5.0)
Alkaline Phosphatase: 96 U/L (ref 38–126)
BILIRUBIN TOTAL: 0.5 mg/dL (ref 0.3–1.2)
BUN: 12 mg/dL (ref 6–20)
CHLORIDE: 95 mmol/L — AB (ref 101–111)
CO2: 26 mmol/L (ref 22–32)
CREATININE: 0.76 mg/dL (ref 0.61–1.24)
Calcium: 9.1 mg/dL (ref 8.9–10.3)
Glucose, Bld: 538 mg/dL (ref 65–99)
POTASSIUM: 3.5 mmol/L (ref 3.5–5.1)
Sodium: 131 mmol/L — ABNORMAL LOW (ref 135–145)
TOTAL PROTEIN: 6.2 g/dL — AB (ref 6.5–8.1)

## 2017-05-23 LAB — BASIC METABOLIC PANEL
Anion gap: 10 (ref 5–15)
BUN: 18 mg/dL (ref 6–20)
CALCIUM: 9.2 mg/dL (ref 8.9–10.3)
CO2: 28 mmol/L (ref 22–32)
CREATININE: 1.15 mg/dL (ref 0.61–1.24)
Chloride: 96 mmol/L — ABNORMAL LOW (ref 101–111)
GFR calc Af Amer: >60 mL/min (ref >60)
GLUCOSE: 760 mg/dL — AB (ref 65–99)
Potassium: 4.2 mmol/L (ref 3.5–5.1)
Sodium: 134 mmol/L — ABNORMAL LOW (ref 135–145)

## 2017-05-23 LAB — URINALYSIS, ROUTINE W REFLEX MICROSCOPIC
Bacteria, UA: NONE SEEN
Bilirubin Urine: NEGATIVE
Hgb urine dipstick: NEGATIVE
Ketones, ur: NEGATIVE mg/dL
Leukocytes, UA: NEGATIVE
Nitrite: NEGATIVE
PROTEIN: NEGATIVE mg/dL
Specific Gravity, Urine: 1.027 (ref 1.005–1.030)
pH: 6 (ref 5.0–8.0)

## 2017-05-23 MED ORDER — INSULIN ASPART PROT & ASPART (70-30 MIX) 100 UNIT/ML ~~LOC~~ SUSP
10.0000 [IU] | Freq: Once | SUBCUTANEOUS | Status: AC
  Administered 2017-05-23: 10 [IU] via SUBCUTANEOUS
  Filled 2017-05-23: qty 10

## 2017-05-23 MED ORDER — SODIUM CHLORIDE 0.9 % IV BOLUS
1000.0000 mL | Freq: Once | INTRAVENOUS | Status: AC
  Administered 2017-05-23: 1000 mL via INTRAVENOUS

## 2017-05-23 NOTE — ED Notes (Signed)
Sophia PA made aware of pt CBG and advised he is stable for discharge.

## 2017-05-23 NOTE — ED Triage Notes (Signed)
Per EMS bystander called related to "he was sitting on side of [address] and looked like he needed medical assistance." Pt found by [employer] with sprite and sweet and low packs.

## 2017-05-23 NOTE — ED Notes (Signed)
Bed: [facility]PT4 Expected date:  Expected time:  Means of arrival:  Comments: 

## 2017-05-23 NOTE — Discharge Instructions (Signed)
It is important you use your insulin as prescribed.  Return to the ER if you develop chest pain, shortness of breath, confusion, or any new or concerning symptoms.

## 2017-05-23 NOTE — ED Provider Notes (Signed)
[facility] DEPT Provider Note   CSN: [id number] Arrival date & time: 05/23/17  1853     History   Chief Complaint Chief Complaint  Patient presents with  . Hyperglycemia    HPI Mike [name] is a 57 y.o. male presented for evaluation of high blood sugar.  Patient is a frequent visitor to the ER, over 60 visits in 6 months.  He is often here for hypoglycemia. Per triage note, bystander called EMS because patient likely need medical assistance.  EMS arrived and patient did not apparent distress.  Patient states he is having no complaints at this time.  He denies pain, nausea, vomiting, or confusion.  He states he has been taking his insulin, but is unable to tell me when.  He denies recent fevers, chills, chest pain, abdominal pain, abnormal bowel movements, or abnormal urinary symptoms.  HPI  Past Medical History:  Diagnosis Date  . Anxiety   . Anxiety   . Chronic lower back pain   . Depression   . DKA (diabetic ketoacidoses) ([place]) 07/30/2016  . Hyperlipemia   . Hypertension   . Migraine    "last one was ~ 4 yr ago" (12/23/2014)  . Seizures ([place])    "related to pills for anxiety; if I don't take the pills I'm suppose to take I'll have them" (12/23/2014)  . Type II diabetes mellitus [facility])     Patient Active Problem List   Diagnosis Date Noted  . AKI (acute kidney injury) ([place]) 03/04/2017  . Malnutrition of moderate degree 02/08/2017  . DKA (diabetic ketoacidoses) ([place]) 02/07/2017  . History of stroke 02/07/2017  . Hyperphosphatemia 12/13/2016  . Evaluation by psychiatric service required 11/30/2016  . Lactic acidosis 11/18/2016  . Normocytic anemia 11/06/2016  . HLD (hyperlipidemia) 11/06/2016  . Adjustment disorder with other symptoms 11/05/2016  . Uncontrolled type 2 diabetes mellitus with hyperosmolar nonketotic hyperglycemia ([place]) 08/24/2016  . Essential hypertension 08/24/2016  . Acute ischemic stroke ([place])   . Protein-calorie  malnutrition, severe 12/24/2014  . Homelessness 12/23/2014  . Hypertensive urgency 12/23/2014  . DM (diabetes mellitus) ([place]) 12/23/2014  . Intermittent palpitations 12/23/2014  . Tobacco abuse 12/23/2014  . Pleuritic chest pain 12/23/2014  . Abnormal EKG 12/23/2014  . Type II diabetes mellitus with renal manifestations [facility])     Past Surgical History:  Procedure Laterality Date  . NO PAST SURGERIES          Home Medications    Prior to Admission medications   Medication Sig Start Date End Date Taking? Authorizing Provider  aspirin 325 MG tablet Take 1 tablet (325 mg total) by mouth daily. 03/08/17  Yes Georgette Shell, MD  atorvastatin (LIPITOR) 40 MG tablet Take 1 tablet (40 mg total) by mouth daily. 01/26/17  Yes Cardama, Grayce Sessions, MD  ferrous sulfate 325 (65 FE) MG tablet Take 1 tablet (325 mg total) daily by mouth. Patient taking differently: Take 325 mg by mouth daily as needed (low iron).  11/23/16  Yes Ward, Cyril Mourning N, DO  insulin aspart protamine- aspart (NOVOLOG MIX 70/30) (70-30) 100 UNIT/ML injection Inject 0.3 mLs (30 Units total) into the skin 2 (two) times daily with a meal. 04/30/17  Yes Dorie Rank, MD  lisinopril (PRINIVIL,ZESTRIL) 40 MG tablet Take 1 tablet (40 mg total) by mouth daily. 04/30/17  Yes Dorie Rank, MD  metoprolol tartrate (LOPRESSOR) 25 MG tablet Take 0.5 tablets (12.5 mg total) by mouth 2 (two) times daily. 03/07/17  Yes Georgette Shell,  MD  blood glucose meter kit and supplies KIT Dispense based on patient and insurance preference. Use up to four times daily as directed. (FOR ICD-9 250.00, 250.01). 11/22/16   Velvet Bathe, MD    Family History Family History  Problem Relation Age of Onset  . Diabetes Mellitus II Mother   . Diabetes Mellitus II Father     Social History Social History   Tobacco Use  . Smoking status: Current Every Day Smoker    Packs/day: 0.10    Years: 40.00    Pack years: 4.00    Types: Cigarettes  .  Smokeless tobacco: Never Used  Substance Use Topics  . Alcohol use: Yes    Alcohol/week: 1.2 oz    Types: 2 Cans of beer per week  . Drug use: No    Comment: 12/23/2014 "stopped ~ 10 yrs ago"     Allergies   Ibuprofen and Tylenol [acetaminophen]   Review of Systems Review of Systems  All other systems reviewed and are negative.    Physical Exam Updated Vital Signs BP 133/63 (BP Location: Right Arm)   Pulse (!) 57   Temp 98.4 F (36.9 C) (Oral)   Resp 15   Ht 6' 4" (1.93 m)   Wt 77.1 kg (170 lb)   SpO2 98%   BMI 20.69 kg/m   Physical Exam  Constitutional: He is oriented to person, place, and time. He appears well-developed and well-nourished. No distress.  Pt appears in NAD  HENT:  Head: Normocephalic and atraumatic.  Eyes: EOM are normal.  Neck: Normal range of motion.  Cardiovascular: Normal rate, regular rhythm and intact distal pulses.  Pulmonary/Chest: Effort normal and breath sounds normal. No respiratory distress. He has no wheezes.  Abdominal: Soft. He exhibits no distension and no mass. There is no tenderness. There is no guarding.  Musculoskeletal: Normal range of motion.  Neurological: He is alert and oriented to person, place, and time.  Skin: Skin is warm. No rash noted.  Psychiatric: He has a normal mood and affect.  Nursing note and vitals reviewed.    ED Treatments / Results  Labs (all labs ordered are listed, but only abnormal results are displayed) Labs Reviewed  CBC WITH DIFFERENTIAL/PLATELET - Abnormal; Notable for the following components:      Result Value   RBC 3.73 (*)    Hemoglobin 9.4 (*)    HCT 29.6 (*)    MCH 25.2 (*)    All other components within normal limits  COMPREHENSIVE METABOLIC PANEL - Abnormal; Notable for the following components:   Sodium 131 (*)    Chloride 95 (*)    Glucose, Bld 538 (*)    Total Protein 6.2 (*)    Albumin 3.1 (*)    All other components within normal limits  URINALYSIS, ROUTINE W REFLEX  MICROSCOPIC - Abnormal; Notable for the following components:   Color, Urine STRAW (*)    Glucose, UA >=500 (*)    All other components within normal limits  CBG MONITORING, ED - Abnormal; Notable for the following components:   Glucose-Capillary >600 (*)    All other components within normal limits  CBG MONITORING, ED - Abnormal; Notable for the following components:   Glucose-Capillary 515 (*)    All other components within normal limits    EKG None  Radiology No results found.  Procedures Procedures (including critical care time)  Medications Ordered in ED Medications  sodium chloride 0.9 % bolus 1,000 mL (0 mLs Intravenous  Stopped 05/23/17 2040)     Initial Impression / Assessment and Plan / ED Course  I have reviewed the triage vital signs and the nursing notes.  Pertinent labs & imaging results that were available during my care of the patient were reviewed by me and considered in my medical decision making (see chart for details).     Patient presenting for evaluation of elevated blood sugar.  Labs show patient is not in DKA.  No complaints at this time.  CBG improved with bolus fluid, initiially >600, now low 500s.  Physical exam shows patient who appears in no distress.  Per MSE plan, I believe patient is safe for discharge.  Encouraged f/u with PCP. Return precautions given.  Patient states he understands.   Final Clinical Impressions(s) / ED Diagnoses   Final diagnoses:  Hyperglycemia    ED Discharge Orders    None       Franchot Heidelberg, PA-C 05/23/17 2145    Tegeler, Gwenyth Allegra, MD 05/23/17 2312

## 2017-05-24 ENCOUNTER — Other Ambulatory Visit: Payer: Self-pay

## 2017-05-24 ENCOUNTER — Emergency Department (HOSPITAL_COMMUNITY)
Admission: EM | Admit: 2017-05-24 | Discharge: 2017-05-25 | Disposition: A | Discharge status: Home / Self Care | Payer: Self-pay | Attending: Emergency Medicine | Admitting: Emergency Medicine

## 2017-05-24 DIAGNOSIS — R739 Hyperglycemia, unspecified: Secondary | ICD-10-CM

## 2017-05-24 DIAGNOSIS — Z794 Long term (current) use of insulin: Secondary | ICD-10-CM | POA: Insufficient documentation

## 2017-05-24 DIAGNOSIS — F17210 Nicotine dependence, cigarettes, uncomplicated: Secondary | ICD-10-CM | POA: Insufficient documentation

## 2017-05-24 DIAGNOSIS — Z7982 Long term (current) use of aspirin: Secondary | ICD-10-CM | POA: Insufficient documentation

## 2017-05-24 DIAGNOSIS — I10 Essential (primary) hypertension: Secondary | ICD-10-CM | POA: Insufficient documentation

## 2017-05-24 DIAGNOSIS — E1165 Type 2 diabetes mellitus with hyperglycemia: Secondary | ICD-10-CM | POA: Insufficient documentation

## 2017-05-24 DIAGNOSIS — Z79899 Other long term (current) drug therapy: Secondary | ICD-10-CM | POA: Insufficient documentation

## 2017-05-24 LAB — CBC WITH DIFFERENTIAL/PLATELET
BASOS PCT: 0 %
Basophils Absolute: 0 10*3/uL (ref 0.0–0.1)
EOS ABS: 0 10*3/uL (ref 0.0–0.7)
Eosinophils Relative: 1 %
HEMATOCRIT: 34.4 % — AB (ref 39.0–52.0)
HEMOGLOBIN: 11 g/dL — AB (ref 13.0–17.0)
LYMPHS ABS: 1.4 10*3/uL (ref 0.7–4.0)
Lymphocytes Relative: 25 %
MCH: 25.8 pg — ABNORMAL LOW (ref 26.0–34.0)
MCHC: 32 g/dL (ref 30.0–36.0)
MCV: 80.6 fL (ref 78.0–100.0)
MONOS PCT: 3 %
Monocytes Absolute: 0.2 10*3/uL (ref 0.1–1.0)
NEUTROS PCT: 71 %
Neutro Abs: 4.1 10*3/uL (ref 1.7–7.7)
Platelets: 321 10*3/uL (ref 150–400)
RBC: 4.27 MIL/uL (ref 4.22–5.81)
RDW: 13.4 % (ref 11.5–15.5)
WBC: 5.7 10*3/uL (ref 4.0–10.5)

## 2017-05-24 LAB — BLOOD GAS, VENOUS
ACID-BASE EXCESS: 3.7 mmol/L — AB (ref 0.0–2.0)
BICARBONATE: 29.5 mmol/L — AB (ref 20.0–28.0)
O2 Saturation: 58 %
PCO2 VEN: 53.1 mmHg (ref 44.0–60.0)
PH VEN: 7.363 (ref 7.250–7.430)
PO2 VEN: 33.1 mmHg (ref 32.0–45.0)
Patient temperature: 98.6

## 2017-05-24 LAB — BASIC METABOLIC PANEL
ANION GAP: 13 (ref 5–15)
BUN: 15 mg/dL (ref 6–20)
CHLORIDE: 85 mmol/L — AB (ref 101–111)
CO2: 25 mmol/L (ref 22–32)
CREATININE: 0.97 mg/dL (ref 0.61–1.24)
Calcium: 9.1 mg/dL (ref 8.9–10.3)
GFR calc non Af Amer: >60 mL/min (ref >60)
Glucose, Bld: 949 mg/dL (ref 65–99)
POTASSIUM: 4.1 mmol/L (ref 3.5–5.1)
SODIUM: 123 mmol/L — AB (ref 135–145)

## 2017-05-24 LAB — CBG MONITORING, ED
Glucose-Capillary: >600 mg/dL (ref 65–99)
Glucose-Capillary: >600 mg/dL (ref 65–99)

## 2017-05-24 LAB — I-STAT CHEM 8, ED
BUN: 19 mg/dL (ref 6–20)
CHLORIDE: 87 mmol/L — AB (ref 101–111)
Calcium, Ion: 1.18 mmol/L (ref 1.15–1.40)
Creatinine, Ser: 0.8 mg/dL (ref 0.61–1.24)
Glucose, Bld: >700 mg/dL (ref 65–99)
HEMATOCRIT: 38 % — AB (ref 39.0–52.0)
Hemoglobin: 12.9 g/dL — ABNORMAL LOW (ref 13.0–17.0)
Potassium: 5.2 mmol/L — ABNORMAL HIGH (ref 3.5–5.1)
SODIUM: 123 mmol/L — AB (ref 135–145)
TCO2: 29 mmol/L (ref 22–32)

## 2017-05-24 MED ORDER — INSULIN ASPART PROT & ASPART (70-30 MIX) 100 UNIT/ML ~~LOC~~ SUSP
10.0000 [IU] | Freq: Once | SUBCUTANEOUS | Status: AC
  Administered 2017-05-24: 10 [IU] via SUBCUTANEOUS
  Filled 2017-05-24: qty 10

## 2017-05-24 NOTE — ED Notes (Addendum)
Notified PA, Gekas and RN,Jake pt. I-stat Chem 8 results sodium 123, glucose greater than 700 and potassium 5.2.

## 2017-05-24 NOTE — ED Notes (Signed)
Date and time results received: 05/24/17 2146 (use smartphrase ".now" to insert current time)  Test: Glucose Critical Value: 37  Name of Provider Notified:Isaacs Md  Orders Received? Or Actions Taken?: Orders Received - See Orders for details

## 2017-05-24 NOTE — ED Notes (Signed)
Patient covered in urine and stool-patient incontinent urine/stool-patient bathed and placed in gown. Patient has 1 plus edema to ankles and feet and has fungal infection of all nailbeds/feet-no toenail to right great toe and left great toe nail almost off.

## 2017-05-24 NOTE — ED Notes (Signed)
Bed: [facility] Expected date:  Expected time:  Means of arrival:  Comments: 

## 2017-05-24 NOTE — ED Triage Notes (Signed)
Pt c/o hyperglycemia. EMS picked pt up from [place]. Ambulatory.

## 2017-05-24 NOTE — ED Provider Notes (Signed)
[place] DEPT Provider Note   CSN: [id number] Arrival date & time: 05/24/17  1833     History   Chief Complaint Chief Complaint  Patient presents with  . Hyperglycemia    HPI Mike [name] is a 56 y.o. male who presents with hyperglycemia. PMH significant for DM, frequent ED visits, homelessness, medical non-compliance, HTN. He states he is here for "high blood sugar". He denies any pain. When asked where he was today he states "nowhere". He apparently was at [place] when EMS was called to bring him to the ED. He is a poor historian.  HPI  Past Medical History:  Diagnosis Date  . Anxiety   . Anxiety   . Chronic lower back pain   . Depression   . DKA (diabetic ketoacidoses) ([place]) 07/30/2016  . Hyperlipemia   . Hypertension   . Migraine    "last one was ~ 4 yr ago" (12/23/2014)  . Seizures ([place])    "related to pills for anxiety; if I don't take the pills I'm suppose to take I'll have them" (12/23/2014)  . Type II diabetes mellitus [facility])     Patient Active Problem List   Diagnosis Date Noted  . AKI (acute kidney injury) ([place]) 03/04/2017  . Malnutrition of moderate degree 02/08/2017  . DKA (diabetic ketoacidoses) ([place]) 02/07/2017  . History of stroke 02/07/2017  . Hyperphosphatemia 12/13/2016  . Evaluation by psychiatric service required 11/30/2016  . Lactic acidosis 11/18/2016  . Normocytic anemia 11/06/2016  . HLD (hyperlipidemia) 11/06/2016  . Adjustment disorder with other symptoms 11/05/2016  . Uncontrolled type 2 diabetes mellitus with hyperosmolar nonketotic hyperglycemia ([place]) 08/24/2016  . Essential hypertension 08/24/2016  . Acute ischemic stroke ([place])   . Protein-calorie malnutrition, severe 12/24/2014  . Homelessness 12/23/2014  . Hypertensive urgency 12/23/2014  . DM (diabetes mellitus) ([place]) 12/23/2014  . Intermittent palpitations 12/23/2014  . Tobacco abuse 12/23/2014  . Pleuritic chest pain 12/23/2014  .  Abnormal EKG 12/23/2014  . Type II diabetes mellitus with renal manifestations [facility])     Past Surgical History:  Procedure Laterality Date  . NO PAST SURGERIES          Home Medications    Prior to Admission medications   Medication Sig Start Date End Date Taking? Authorizing Provider  aspirin 325 MG tablet Take 1 tablet (325 mg total) by mouth daily. 03/08/17  Yes Georgette Shell, MD  atorvastatin (LIPITOR) 40 MG tablet Take 1 tablet (40 mg total) by mouth daily. 01/26/17  Yes Cardama, Grayce Sessions, MD  ferrous sulfate 325 (65 FE) MG tablet Take 1 tablet (325 mg total) daily by mouth. Patient taking differently: Take 325 mg by mouth daily as needed (low iron).  11/23/16  Yes Ward, Cyril Mourning N, DO  insulin aspart protamine- aspart (NOVOLOG MIX 70/30) (70-30) 100 UNIT/ML injection Inject 0.3 mLs (30 Units total) into the skin 2 (two) times daily with a meal. 04/30/17  Yes Dorie Rank, MD  lisinopril (PRINIVIL,ZESTRIL) 40 MG tablet Take 1 tablet (40 mg total) by mouth daily. 04/30/17  Yes Dorie Rank, MD  metoprolol tartrate (LOPRESSOR) 25 MG tablet Take 0.5 tablets (12.5 mg total) by mouth 2 (two) times daily. 03/07/17  Yes Georgette Shell, MD  blood glucose meter kit and supplies KIT Dispense based on patient and insurance preference. Use up to four times daily as directed. (FOR ICD-9 250.00, 250.01). 11/22/16   Velvet Bathe, MD    Family History Family History  Problem Relation Age  of Onset  . Diabetes Mellitus II Mother   . Diabetes Mellitus II Father     Social History Social History   Tobacco Use  . Smoking status: Current Every Day Smoker    Packs/day: 0.10    Years: 40.00    Pack years: 4.00    Types: Cigarettes  . Smokeless tobacco: Never Used  Substance Use Topics  . Alcohol use: Yes    Alcohol/week: 1.2 oz    Types: 2 Cans of beer per week  . Drug use: No    Comment: 12/23/2014 "stopped ~ 10 yrs ago"     Allergies   Ibuprofen and Tylenol  [acetaminophen]   Review of Systems Review of Systems  Constitutional: Negative for fever.  Cardiovascular: Negative for chest pain.  Gastrointestinal: Negative for abdominal pain.  Neurological: Negative for headaches.  All other systems reviewed and are negative.    Physical Exam Updated Vital Signs BP (!) 182/86 (BP Location: Left Arm)   Pulse 67   Temp 97.9 F (36.6 C) (Oral)   Resp 20   Ht _0  (1.93 m)   Wt 77.1 kg (170 lb)   SpO2 100%   BMI 20.69 kg/m   Physical Exam  Constitutional: He is oriented to person, place, and time. No distress.  Disheveled, NAD  HENT:  Head: Normocephalic and atraumatic.  Eyes: Pupils are equal, round, and reactive to light. Conjunctivae are normal. Right eye exhibits no discharge. Left eye exhibits no discharge. No scleral icterus.  Neck: Normal range of motion.  Cardiovascular: Normal rate and regular rhythm.  Pulmonary/Chest: Effort normal and breath sounds normal. No respiratory distress.  Abdominal: Soft. Bowel sounds are normal. He exhibits no distension.  Neurological: He is alert and oriented to person, place, and time.  Skin: Skin is warm and dry.  Psychiatric: He has a normal mood and affect. His behavior is normal.  Nursing note and vitals reviewed.    ED Treatments / Results  Labs (all labs ordered are listed, but only abnormal results are displayed) Labs Reviewed  CBC WITH DIFFERENTIAL/PLATELET - Abnormal; Notable for the following components:      Result Value   Hemoglobin 11.0 (*)    HCT 34.4 (*)    MCH 25.8 (*)    All other components within normal limits  BASIC METABOLIC PANEL - Abnormal; Notable for the following components:   Sodium 123 (*)    Chloride 85 (*)    Glucose, Bld 949 (*)    All other components within normal limits  BLOOD GAS, VENOUS - Abnormal; Notable for the following components:   Bicarbonate 29.5 (*)    Acid-Base Excess 3.7 (*)    All other components within normal limits  CBG  MONITORING, ED - Abnormal; Notable for the following components:   Glucose-Capillary >600 (*)    All other components within normal limits  I-STAT CHEM 8, ED - Abnormal; Notable for the following components:   Sodium 123 (*)    Potassium 5.2 (*)    Chloride 87 (*)    Glucose, Bld >700 (*)    Hemoglobin 12.9 (*)    HCT 38.0 (*)    All other components within normal limits  CBG MONITORING, ED    EKG None  Radiology No results found.  Procedures Procedures (including critical care time)  Medications Ordered in ED Medications  insulin aspart protamine- aspart (NOVOLOG MIX 70/30) injection 10 Units (10 Units Subcutaneous Given 05/24/17 2111)     Initial Impression /  Assessment and Plan / ED Course  I have reviewed the triage vital signs and the nursing notes.  Pertinent labs & imaging results that were available during my care of the patient were reviewed by me and considered in my medical decision making (see chart for details).  57 year old male presents with hyperglycemia. He is hypertensive but otherwise vitals are normal. He has no pain on exam. I-stat Chem 8 is remarkable for hyponatremia (123), hyperkalemia (5.2). His pH is normal. Formal BMP was obtained which was remarkable for Na of 123 and glucose of 949. Calculated corrected Na level for hyperglycemia is 137-143. Discussed with Dr. Myrene Buddy. Will give insulin and recheck CBG. Care was transferred to Donnelly Angelica PA-C at shift change.  Final Clinical Impressions(s) / ED Diagnoses   Final diagnoses:  Hyperglycemia    ED Discharge Orders    None       Recardo Evangelist, PA-C 05/24/17 2335    Duffy Bruce, MD 05/27/17 1253

## 2017-05-24 NOTE — ED Provider Notes (Signed)
Care assumed from Rodri­guez Hevia, [place] at shift change. See her note for full HPI and workup.  Briefly, patient presenting to the ED with hyperglycemia.  Patient with multiple ED visits for similar complaint.  Found to be hyperglycemic with glucose of 950.  No evidence of DKA.  10 units of insulin administered.  Plan is to recheck CBG with goal less than 600.  Once CBG is less than 600, patient is safe for discharge. Physical Exam  BP (!) 182/86 (BP Location: Left Arm)   Pulse 67   Temp 97.9 F (36.6 C) (Oral)   Resp 20   Ht  (1.93 m)   Wt 77.1 kg (170 lb)   SpO2 100%   BMI 20.69 kg/m   Physical Exam  Constitutional: He appears well-developed and well-nourished.  Pt sleeping  HENT:  Head: Normocephalic and atraumatic.  Eyes: Conjunctivae are normal.  Cardiovascular: Normal rate.  Pulmonary/Chest: Effort normal.  Psychiatric: He has a normal mood and affect. His behavior is normal.  Nursing note and vitals reviewed.   ED Course/Procedures   Clinical Course as of May 25 425  Fri May 25, 2017  0015 Will order another dose of inulin and IVF.  POC CBG, ED(!!) [JR]    Clinical Course User Index [JR] Preston Weill, [place] N, PA-C    Procedures  MDM  Repeat CBG upon assumption of care continues to be greater than 600.  IV fluid bolus ordered.  10 units of insulin aspart ordered and administered.  Will reevaluate.  CBG 199 after intervention.  At this time patient is safe for discharge with recommendation to follow-up with his PCP.  Discussed results, findings, treatment and follow up. Patient advised of return precautions. Patient verbalized understanding and agreed with plan.       Timiyah Romito, [place] N, PA-C 05/25/17 1610    Shaune Pollack, MD 05/27/17 1253

## 2017-05-25 ENCOUNTER — Encounter (HOSPITAL_COMMUNITY): Payer: Self-pay | Admitting: Emergency Medicine

## 2017-05-25 ENCOUNTER — Emergency Department (HOSPITAL_COMMUNITY)
Admission: EM | Admit: 2017-05-25 | Discharge: 2017-05-25 | Disposition: A | Discharge status: Home / Self Care | Payer: Self-pay | Attending: Emergency Medicine | Admitting: Emergency Medicine

## 2017-05-25 DIAGNOSIS — Z794 Long term (current) use of insulin: Secondary | ICD-10-CM | POA: Insufficient documentation

## 2017-05-25 DIAGNOSIS — R739 Hyperglycemia, unspecified: Secondary | ICD-10-CM

## 2017-05-25 DIAGNOSIS — E11649 Type 2 diabetes mellitus with hypoglycemia without coma: Secondary | ICD-10-CM | POA: Insufficient documentation

## 2017-05-25 LAB — I-STAT CHEM 8, ED
BUN: 12 mg/dL (ref 6–20)
CHLORIDE: 89 mmol/L — AB (ref 101–111)
CREATININE: 0.8 mg/dL (ref 0.61–1.24)
Calcium, Ion: 1.17 mmol/L (ref 1.15–1.40)
Glucose, Bld: >700 mg/dL (ref 65–99)
HEMATOCRIT: 35 % — AB (ref 39.0–52.0)
Hemoglobin: 11.9 g/dL — ABNORMAL LOW (ref 13.0–17.0)
Potassium: 4.3 mmol/L (ref 3.5–5.1)
Sodium: 127 mmol/L — ABNORMAL LOW (ref 135–145)
TCO2: 28 mmol/L (ref 22–32)

## 2017-05-25 LAB — CBG MONITORING, ED
Glucose-Capillary: 199 mg/dL — ABNORMAL HIGH (ref 65–99)
Glucose-Capillary: 593 mg/dL (ref 65–99)

## 2017-05-25 MED ORDER — INSULIN ASPART 100 UNIT/ML ~~LOC~~ SOLN
10.0000 [IU] | Freq: Once | SUBCUTANEOUS | Status: AC
  Administered 2017-05-25: 10 [IU] via SUBCUTANEOUS
  Filled 2017-05-25: qty 1

## 2017-05-25 MED ORDER — INSULIN ASPART 100 UNIT/ML ~~LOC~~ SOLN
12.0000 [IU] | Freq: Once | SUBCUTANEOUS | Status: AC
  Administered 2017-05-25: 12 [IU] via SUBCUTANEOUS
  Filled 2017-05-25: qty 1

## 2017-05-25 MED ORDER — SODIUM CHLORIDE 0.9 % IV BOLUS
1000.0000 mL | Freq: Once | INTRAVENOUS | Status: AC
  Administered 2017-05-25: 1000 mL via INTRAVENOUS

## 2017-05-25 NOTE — ED Notes (Signed)
Pt moving from seat to seat in the lobby,he is urinating on one and moving to another and urinating again

## 2017-05-25 NOTE — ED Triage Notes (Signed)
Per [facility] pt comes from in front of [employer] eating chocolate chip cookies c/o hyperglycemia. CBG read high with EMS.

## 2017-05-25 NOTE — ED Provider Notes (Signed)
[facility] DEPT Provider Note   CSN: [id number] Arrival date & time: 05/25/17  1841     History   Chief Complaint Chief Complaint  Patient presents with  . Hyperglycemia    HPI Mike [name] is a 57 y.o. male.  57 year old male with history of insulin-dependent diabetes presents with recurrent hypoglycemia.  Patient is seen here almost every day with similar complaints.  He is very noncompliant with his diet and frequently and just sugar prior to arrival.  EMS noted today that when he picked him up he was eating chocolate chip cookies on their arrival.  Blood sugar was elevated and patient was transported here.  When questioned directly why patient has not followed up at the [facility] with [facility] patient states he has plans to do this on Monday.  Has had some polyuria and polydipsia.  Has no other complaints.     Past Medical History:  Diagnosis Date  . Anxiety   . Anxiety   . Chronic lower back pain   . Depression   . DKA (diabetic ketoacidoses) ([place]) 07/30/2016  . Hyperlipemia   . Hypertension   . Migraine    "last one was ~ 4 yr ago" (12/23/2014)  . Seizures ([place])    "related to pills for anxiety; if I don't take the pills I'm suppose to take I'll have them" (12/23/2014)  . Type II diabetes mellitus [facility])     Patient Active Problem List   Diagnosis Date Noted  . AKI (acute kidney injury) ([place]) 03/04/2017  . Malnutrition of moderate degree 02/08/2017  . DKA (diabetic ketoacidoses) ([place]) 02/07/2017  . History of stroke 02/07/2017  . Hyperphosphatemia 12/13/2016  . Evaluation by psychiatric service required 11/30/2016  . Lactic acidosis 11/18/2016  . Normocytic anemia 11/06/2016  . HLD (hyperlipidemia) 11/06/2016  . Adjustment disorder with other symptoms 11/05/2016  . Uncontrolled type 2 diabetes mellitus with hyperosmolar nonketotic hyperglycemia ([place]) 08/24/2016  . Essential hypertension 08/24/2016  . Acute ischemic stroke ([place])    . Protein-calorie malnutrition, severe 12/24/2014  . Homelessness 12/23/2014  . Hypertensive urgency 12/23/2014  . DM (diabetes mellitus) ([place]) 12/23/2014  . Intermittent palpitations 12/23/2014  . Tobacco abuse 12/23/2014  . Pleuritic chest pain 12/23/2014  . Abnormal EKG 12/23/2014  . Type II diabetes mellitus with renal manifestations [facility])     Past Surgical History:  Procedure Laterality Date  . NO PAST SURGERIES          Home Medications    Prior to Admission medications   Medication Sig Start Date End Date Taking? Authorizing Provider  aspirin 325 MG tablet Take 1 tablet (325 mg total) by mouth daily. 03/08/17  Yes Georgette Shell, MD  atorvastatin (LIPITOR) 40 MG tablet Take 1 tablet (40 mg total) by mouth daily. 01/26/17  Yes Cardama, Grayce Sessions, MD  blood glucose meter kit and supplies KIT Dispense based on patient and insurance preference. Use up to four times daily as directed. (FOR ICD-9 250.00, 250.01). 11/22/16  Yes Velvet Bathe, MD  ferrous sulfate 325 (65 FE) MG tablet Take 1 tablet (325 mg total) daily by mouth. Patient taking differently: Take 325 mg by mouth daily as needed (low iron).  11/23/16  Yes Ward, Cyril Mourning N, DO  insulin aspart protamine- aspart (NOVOLOG MIX 70/30) (70-30) 100 UNIT/ML injection Inject 0.3 mLs (30 Units total) into the skin 2 (two) times daily with a meal. 04/30/17  Yes Dorie Rank, MD  lisinopril (PRINIVIL,ZESTRIL) 40 MG tablet Take 1  tablet (40 mg total) by mouth daily. 04/30/17  Yes Dorie Rank, MD  metoprolol tartrate (LOPRESSOR) 25 MG tablet Take 0.5 tablets (12.5 mg total) by mouth 2 (two) times daily. 03/07/17  Yes Georgette Shell, MD    Family History Family History  Problem Relation Age of Onset  . Diabetes Mellitus II Mother   . Diabetes Mellitus II Father     Social History Social History   Tobacco Use  . Smoking status: Current Every Day Smoker    Packs/day: 0.10    Years: 40.00    Pack years: 4.00    Types:  Cigarettes  . Smokeless tobacco: Never Used  Substance Use Topics  . Alcohol use: Yes    Alcohol/week: 1.2 oz    Types: 2 Cans of beer per week  . Drug use: No    Comment: 12/23/2014 "stopped ~ 10 yrs ago"     Allergies   Ibuprofen and Tylenol [acetaminophen]   Review of Systems Review of Systems  All other systems reviewed and are negative.    Physical Exam Updated Vital Signs BP (!) 184/92 (BP Location: Left Arm)   Pulse 73   Temp 98.9 F (37.2 C) (Oral)   Resp 20   SpO2 100%   Physical Exam  Constitutional: He is oriented to person, place, and time. He appears well-developed and well-nourished.  Non-toxic appearance. No distress.  HENT:  Head: Normocephalic and atraumatic.  Eyes: Pupils are equal, round, and reactive to light. Conjunctivae, EOM and lids are normal.  Neck: Normal range of motion. Neck supple. No tracheal deviation present. No thyroid mass present.  Cardiovascular: Normal rate, regular rhythm and normal heart sounds. Exam reveals no gallop.  No murmur heard. Pulmonary/Chest: Effort normal and breath sounds normal. No stridor. No respiratory distress. He has no decreased breath sounds. He has no wheezes. He has no rhonchi. He has no rales.  Abdominal: Soft. Normal appearance and bowel sounds are normal. He exhibits no distension. There is no tenderness. There is no rebound and no CVA tenderness.  Musculoskeletal: Normal range of motion. He exhibits no edema or tenderness.  Neurological: He is alert and oriented to person, place, and time. No cranial nerve deficit. GCS eye subscore is 4. GCS verbal subscore is 5. GCS motor subscore is 6.  Skin: Skin is warm and dry. No abrasion and no rash noted.  Psychiatric: His affect is blunt. He is withdrawn.  Nursing note and vitals reviewed.    ED Treatments / Results  Labs (all labs ordered are listed, but only abnormal results are displayed) Labs Reviewed  I-STAT CHEM 8, ED - Abnormal; Notable for the  following components:      Result Value   Sodium 127 (*)    Chloride 89 (*)    Glucose, Bld >700 (*)    Hemoglobin 11.9 (*)    HCT 35.0 (*)    All other components within normal limits  CBG MONITORING, ED - Abnormal; Notable for the following components:   Glucose-Capillary 593 (*)    All other components within normal limits    EKG None  Radiology No results found.  Procedures Procedures (including critical care time)  Medications Ordered in ED Medications  insulin aspart (novoLOG) injection 12 Units (12 Units Subcutaneous Given 05/25/17 2220)     Initial Impression / Assessment and Plan / ED Course  I have reviewed the triage vital signs and the nursing notes.  Pertinent labs & imaging results that were available during  my care of the patient were reviewed by me and considered in my medical decision making (see chart for details).     His blood sugar here was noted and was above 700.  He was given 12 units of regular insulin.  Patient has no evidence of DKA.  Repeat blood sugar has decreased to 593.  Patient strongly encouraged to follow-up at the [facility].  Was also instructed to not ingest sugar.  Final Clinical Impressions(s) / ED Diagnoses   Final diagnoses:  None    ED Discharge Orders    None       Lacretia Leigh, MD 05/25/17 2302

## 2017-05-25 NOTE — Discharge Instructions (Signed)
Take your insulin as prescribed, and eat a low-carb, low sugar diet.  Follow-up with your primary care provider.

## 2017-05-26 ENCOUNTER — Observation Stay (HOSPITAL_COMMUNITY)
Admission: EM | Admit: 2017-05-26 | Discharge: 2017-05-27 | Disposition: A | Discharge status: Home / Self Care | Payer: Self-pay | Attending: Internal Medicine | Admitting: Internal Medicine

## 2017-05-26 ENCOUNTER — Encounter (HOSPITAL_COMMUNITY): Payer: Self-pay

## 2017-05-26 ENCOUNTER — Other Ambulatory Visit: Payer: Self-pay

## 2017-05-26 DIAGNOSIS — Z5900 Homelessness unspecified: Secondary | ICD-10-CM

## 2017-05-26 DIAGNOSIS — I10 Essential (primary) hypertension: Secondary | ICD-10-CM | POA: Insufficient documentation

## 2017-05-26 DIAGNOSIS — Z8673 Personal history of transient ischemic attack (TIA), and cerebral infarction without residual deficits: Secondary | ICD-10-CM

## 2017-05-26 DIAGNOSIS — Z794 Long term (current) use of insulin: Secondary | ICD-10-CM | POA: Insufficient documentation

## 2017-05-26 DIAGNOSIS — E119 Type 2 diabetes mellitus without complications: Secondary | ICD-10-CM

## 2017-05-26 DIAGNOSIS — R739 Hyperglycemia, unspecified: Secondary | ICD-10-CM

## 2017-05-26 DIAGNOSIS — Z9119 Patient's noncompliance with other medical treatment and regimen: Secondary | ICD-10-CM | POA: Insufficient documentation

## 2017-05-26 DIAGNOSIS — Z79899 Other long term (current) drug therapy: Secondary | ICD-10-CM | POA: Insufficient documentation

## 2017-05-26 DIAGNOSIS — Z91199 Patient's noncompliance with other medical treatment and regimen due to unspecified reason: Secondary | ICD-10-CM

## 2017-05-26 DIAGNOSIS — E1122 Type 2 diabetes mellitus with diabetic chronic kidney disease: Secondary | ICD-10-CM

## 2017-05-26 DIAGNOSIS — I160 Hypertensive urgency: Secondary | ICD-10-CM | POA: Diagnosis present

## 2017-05-26 DIAGNOSIS — E1165 Type 2 diabetes mellitus with hyperglycemia: Principal | ICD-10-CM | POA: Insufficient documentation

## 2017-05-26 DIAGNOSIS — Z886 Allergy status to analgesic agent status: Secondary | ICD-10-CM | POA: Insufficient documentation

## 2017-05-26 DIAGNOSIS — Z7982 Long term (current) use of aspirin: Secondary | ICD-10-CM | POA: Insufficient documentation

## 2017-05-26 DIAGNOSIS — N182 Chronic kidney disease, stage 2 (mild): Secondary | ICD-10-CM

## 2017-05-26 DIAGNOSIS — E860 Dehydration: Secondary | ICD-10-CM | POA: Insufficient documentation

## 2017-05-26 LAB — CBC WITH DIFFERENTIAL/PLATELET
BASOS ABS: 0 10*3/uL (ref 0.0–0.1)
BASOS PCT: 0 %
EOS ABS: 0 10*3/uL (ref 0.0–0.7)
Eosinophils Relative: 1 %
HCT: 34.4 % — ABNORMAL LOW (ref 39.0–52.0)
Hemoglobin: 10.9 g/dL — ABNORMAL LOW (ref 13.0–17.0)
LYMPHS PCT: 14 %
Lymphs Abs: 0.8 10*3/uL (ref 0.7–4.0)
MCH: 25.3 pg — ABNORMAL LOW (ref 26.0–34.0)
MCHC: 31.7 g/dL (ref 30.0–36.0)
MCV: 79.8 fL (ref 78.0–100.0)
MONO ABS: 0.3 10*3/uL (ref 0.1–1.0)
Monocytes Relative: 5 %
Neutro Abs: 4.4 10*3/uL (ref 1.7–7.7)
Neutrophils Relative %: 80 %
PLATELETS: 313 10*3/uL (ref 150–400)
RBC: 4.31 MIL/uL (ref 4.22–5.81)
RDW: 13.5 % (ref 11.5–15.5)
WBC: 5.5 10*3/uL (ref 4.0–10.5)

## 2017-05-26 LAB — I-STAT CHEM 8, ED
BUN: 17 mg/dL (ref 6–20)
CHLORIDE: 93 mmol/L — AB (ref 101–111)
Calcium, Ion: 1.09 mmol/L — ABNORMAL LOW (ref 1.15–1.40)
Creatinine, Ser: 0.6 mg/dL — ABNORMAL LOW (ref 0.61–1.24)
Glucose, Bld: >700 mg/dL (ref 65–99)
HCT: 39 % (ref 39.0–52.0)
HEMOGLOBIN: 13.3 g/dL (ref 13.0–17.0)
Potassium: 4.1 mmol/L (ref 3.5–5.1)
SODIUM: 128 mmol/L — AB (ref 135–145)
TCO2: 26 mmol/L (ref 22–32)

## 2017-05-26 LAB — BASIC METABOLIC PANEL
Anion gap: 12 (ref 5–15)
BUN: 16 mg/dL (ref 6–20)
CALCIUM: 9.1 mg/dL (ref 8.9–10.3)
CO2: 25 mmol/L (ref 22–32)
Chloride: 92 mmol/L — ABNORMAL LOW (ref 101–111)
Creatinine, Ser: 0.87 mg/dL (ref 0.61–1.24)
GLUCOSE: 1103 mg/dL — AB (ref 65–99)
POTASSIUM: 4.4 mmol/L (ref 3.5–5.1)
SODIUM: 129 mmol/L — AB (ref 135–145)

## 2017-05-26 LAB — CBG MONITORING, ED: Glucose-Capillary: >600 mg/dL (ref 65–99)

## 2017-05-26 MED ORDER — ATORVASTATIN CALCIUM 40 MG PO TABS
40.0000 mg | ORAL_TABLET | Freq: Every day | ORAL | Status: DC
  Administered 2017-05-27 (×2): 40 mg via ORAL
  Filled 2017-05-26 (×3): qty 1

## 2017-05-26 MED ORDER — SODIUM CHLORIDE 0.9% FLUSH
3.0000 mL | Freq: Two times a day (BID) | INTRAVENOUS | Status: DC
  Administered 2017-05-27 (×2): 3 mL via INTRAVENOUS

## 2017-05-26 MED ORDER — INSULIN ASPART PROT & ASPART (70-30 MIX) 100 UNIT/ML ~~LOC~~ SUSP
30.0000 [IU] | Freq: Two times a day (BID) | SUBCUTANEOUS | Status: DC
  Administered 2017-05-27: 30 [IU] via SUBCUTANEOUS
  Filled 2017-05-26 (×2): qty 10

## 2017-05-26 MED ORDER — SODIUM CHLORIDE 0.9 % IV SOLN
INTRAVENOUS | Status: DC
  Administered 2017-05-26: 5.4 [IU]/h via INTRAVENOUS
  Filled 2017-05-26: qty 1

## 2017-05-26 MED ORDER — INSULIN REGULAR BOLUS VIA INFUSION
0.0000 [IU] | Freq: Three times a day (TID) | INTRAVENOUS | Status: DC
  Filled 2017-05-26: qty 10

## 2017-05-26 MED ORDER — SODIUM CHLORIDE 0.9 % IV SOLN
INTRAVENOUS | Status: DC
  Administered 2017-05-26: via INTRAVENOUS

## 2017-05-26 MED ORDER — DEXTROSE 50 % IV SOLN: 25.0000 mL | INTRAVENOUS | Status: DC | PRN

## 2017-05-26 MED ORDER — LISINOPRIL 10 MG PO TABS
40.0000 mg | ORAL_TABLET | Freq: Every day | ORAL | Status: DC
Start: 2017-05-26 — End: 2017-05-27
  Administered 2017-05-26 – 2017-05-27 (×2): 40 mg via ORAL
  Filled 2017-05-26: qty 4
  Filled 2017-05-26: qty 2
  Filled 2017-05-26: qty 4

## 2017-05-26 MED ORDER — HYDRALAZINE HCL 20 MG/ML IJ SOLN
5.0000 mg | Freq: Once | INTRAMUSCULAR | Status: AC
  Administered 2017-05-26: 5 mg via INTRAVENOUS
  Filled 2017-05-26: qty 1

## 2017-05-26 MED ORDER — ASPIRIN 325 MG PO TABS
325.0000 mg | ORAL_TABLET | Freq: Every day | ORAL | Status: DC
  Administered 2017-05-26 – 2017-05-27 (×2): 325 mg via ORAL
  Filled 2017-05-26 (×2): qty 1

## 2017-05-26 MED ORDER — FERROUS SULFATE 325 (65 FE) MG PO TABS
325.0000 mg | ORAL_TABLET | Freq: Every day | ORAL | Status: DC
  Administered 2017-05-27 (×2): 325 mg via ORAL
  Filled 2017-05-26 (×3): qty 1

## 2017-05-26 MED ORDER — METOPROLOL TARTRATE 12.5 MG HALF TABLET
12.5000 mg | ORAL_TABLET | Freq: Two times a day (BID) | ORAL | Status: DC
  Administered 2017-05-26: 12.5 mg via ORAL
  Filled 2017-05-26 (×2): qty 1

## 2017-05-26 MED ORDER — SODIUM CHLORIDE 0.9 % IV SOLN: 250.0000 mL | INTRAVENOUS | Status: DC | PRN

## 2017-05-26 MED ORDER — INSULIN ASPART 100 UNIT/ML ~~LOC~~ SOLN
12.0000 [IU] | Freq: Once | SUBCUTANEOUS | Status: AC
  Administered 2017-05-26: 12 [IU] via SUBCUTANEOUS
  Filled 2017-05-26: qty 1

## 2017-05-26 MED ORDER — DEXTROSE-NACL 5-0.45 % IV SOLN: INTRAVENOUS | Status: DC

## 2017-05-26 MED ORDER — SODIUM CHLORIDE 0.9% FLUSH: 3.0000 mL | INTRAVENOUS | Status: DC | PRN

## 2017-05-26 MED ORDER — SODIUM CHLORIDE 0.9 % IV SOLN
INTRAVENOUS | Status: DC
  Filled 2017-05-26: qty 1

## 2017-05-26 MED ORDER — SODIUM CHLORIDE 0.9 % IV BOLUS
2000.0000 mL | Freq: Once | INTRAVENOUS | Status: AC
  Administered 2017-05-26: 2000 mL via INTRAVENOUS

## 2017-05-26 MED ORDER — SODIUM CHLORIDE 0.9 % IV SOLN: INTRAVENOUS | Status: DC

## 2017-05-26 NOTE — ED Notes (Signed)
When Hospitalist came in room, Pt had pulled out IV.  Pt received around 1,028mL NS., prior to IV removal.

## 2017-05-26 NOTE — ED Notes (Addendum)
Pt given an ice water and warm blanket per request.

## 2017-05-26 NOTE — ED Notes (Signed)
Bed: WA17 Expected date:  Expected time:  Means of arrival:  Comments: Fan

## 2017-05-26 NOTE — ED Notes (Signed)
Date and time results received: 05/26/17 9:21 PM   Test: Glucose Critical Value: 1,103  Name of Provider Notified: Freida Busman MD  Orders Received? Or Actions Taken?: Awaiting further orders

## 2017-05-26 NOTE — Progress Notes (Signed)
ED TO INPATIENT HANDOFF REPORT  Name/Age/Gender Mike [name] 57 y.o. male  Code Status    Code Status Orders  (From admission, onward)        Start     Ordered   05/26/17 2149  Full code  Continuous     05/26/17 2150    Code Status History    Date Active Date Inactive Code Status Order ID Comments User Context   03/05/2017 0003 03/07/2017 1814 Full Code [id number]  Vianne Bulls, MD ED   02/07/2017 1830 02/13/2017 2135 Full Code [id number]  Mariel Aloe, MD ED   11/26/2016 0309 12/22/2016 1723 Full Code [id number]  Vianne Bulls, MD ED   11/18/2016 0317 11/22/2016 2208 Full Code [id number]  Etta Quill, DO ED   11/06/2016 1830 11/08/2016 1706 Full Code [id number]  Ivor Costa, MD ED   11/05/2016 0535 11/05/2016 1638 Full Code [id number]  Delora Fuel, MD ED   11/04/2016 0107 11/04/2016 2112 Full Code [id number]  Gwynne Edinger, MD ED   10/22/2016 1407 10/27/2016 1710 Full Code [id number]  Rondel Jumbo, PA-C ED   08/24/2016 1945 08/25/2016 1832 Full Code [id number]  Ivor Costa, MD ED   07/31/2016 0329 08/06/2016 1858 Full Code [id number]  Ivor Costa, MD ED   06/24/2016 1604 06/28/2016 1833 Full Code [id number]  [name], NP ED   12/23/2014 0803 12/26/2014 1756 Full Code [id number]  Karen Kitchens ED      Home/SNF/Other homeless  Chief Complaint hyperglycemia  Level of Care/Admitting Diagnosis ED Disposition    ED Disposition Condition [place] [facility] Area: [facility] [[id number]]  Level of Care: Stepdown [14]  Admit to SDU based on following criteria: Other see comments  Comments: insulin drip  Diagnosis: Hyperglycemia [878676]  Admitting Physician: Phillips Grout [[id number]]  Attending Physician: Derrill Kay A [[id number]]  PT Class (Do Not Modify): Observation [104]  PT Acc Code (Do Not Modify): Observation [10022]       Medical History Past Medical History:  Diagnosis Date  . Anxiety   . Anxiety   . Chronic lower back pain   .  Depression   . DKA (diabetic ketoacidoses) ([place]) 07/30/2016  . Hyperlipemia   . Hypertension   . Migraine    "last one was ~ 4 yr ago" (12/23/2014)  . Seizures ([place])    "related to pills for anxiety; if I don't take the pills I'm suppose to take I'll have them" (12/23/2014)  . Type II diabetes mellitus (HCC)     Allergies Allergies  Allergen Reactions  . Ibuprofen Nausea And Vomiting and Other (See Comments)    Makes the patient feel bloated also  . Tylenol [Acetaminophen] Nausea And Vomiting and Other (See Comments)    Makes the patient feel bloated also    IV Location/Drains/Wounds Patient Lines/Drains/Airways Status   Active Line/Drains/Airways    Name:   Placement date:   Placement time:   Site:   Days:   Peripheral IV 05/26/17 Anterior;Proximal;Right Forearm   05/26/17    2340    Forearm   less than 1          Labs/Imaging Results for orders placed or performed during the hospital encounter of 05/26/17 (from the past 48 hour(s))  I-Stat Chem 8, ED     Status: Abnormal   Collection Time: 05/26/17  6:53 PM  Result Value Ref Range   Sodium 128 (L) 135 - 145 mmol/L  Potassium 4.1 3.5 - 5.1 mmol/L   Chloride 93 (L) 101 - 111 mmol/L   BUN 17 6 - 20 mg/dL   Creatinine, Ser 0.60 (L) 0.61 - 1.24 mg/dL   Glucose, Bld >700 (HH) 65 - 99 mg/dL   Calcium, Ion 1.09 (L) 1.15 - 1.40 mmol/L   TCO2 26 22 - 32 mmol/L   Hemoglobin 13.3 13.0 - 17.0 g/dL   HCT 39.0 39.0 - 52.0 %   Comment NOTIFIED PHYSICIAN   CBC with Differential/Platelet     Status: Abnormal   Collection Time: 05/26/17  8:23 PM  Result Value Ref Range   WBC 5.5 4.0 - 10.5 K/uL   RBC 4.31 4.22 - 5.81 MIL/uL   Hemoglobin 10.9 (L) 13.0 - 17.0 g/dL   HCT 34.4 (L) 39.0 - 52.0 %   MCV 79.8 78.0 - 100.0 fL   MCH 25.3 (L) 26.0 - 34.0 pg   MCHC 31.7 30.0 - 36.0 g/dL   RDW 13.5 11.5 - 15.5 %   Platelets 313 150 - 400 K/uL   Neutrophils Relative % 80 %   Neutro Abs 4.4 1.7 - 7.7 K/uL   Lymphocytes Relative 14 %    Lymphs Abs 0.8 0.7 - 4.0 K/uL   Monocytes Relative 5 %   Monocytes Absolute 0.3 0.1 - 1.0 K/uL   Eosinophils Relative 1 %   Eosinophils Absolute 0.0 0.0 - 0.7 K/uL   Basophils Relative 0 %   Basophils Absolute 0.0 0.0 - 0.1 K/uL    Comment: Performed at [facility], [place] [address], [place], [place] [address]  Basic metabolic panel     Status: Abnormal   Collection Time: 05/26/17  8:23 PM  Result Value Ref Range   Sodium 129 (L) 135 - 145 mmol/L   Potassium 4.4 3.5 - 5.1 mmol/L   Chloride 92 (L) 101 - 111 mmol/L   CO2 25 22 - 32 mmol/L   Glucose, Bld 1,103 (HH) 65 - 99 mg/dL    Comment: CRITICAL RESULT CALLED TO, READ BACK BY AND VERIFIED WITH: A.CHANEY,RN 05/26/17 '@2119'$  BY V.WILKINS    BUN 16 6 - 20 mg/dL   Creatinine, Ser 0.87 0.61 - 1.24 mg/dL   Calcium 9.1 8.9 - 10.3 mg/dL   GFR calc non Af Amer >60 >60 mL/min   GFR calc Af Amer >60 >60 mL/min    Comment: (NOTE) The eGFR has been calculated using the CKD EPI equation. This calculation has not been validated in all clinical situations. eGFR's persistently <60 mL/min signify possible Chronic Kidney Disease.    Anion gap 12 5 - 15    Comment: Performed at [facility], [place] [address], [place], [place] [address]  CBG monitoring, ED     Status: Abnormal   Collection Time: 05/26/17 11:18 PM  Result Value Ref Range   Glucose-Capillary >600 (HH) 65 - 99 mg/dL   No results found.  Pending Labs Unresulted Labs (From admission, onward)   Start     Ordered   05/27/17 9563  Basic metabolic panel  Tomorrow morning,   R     05/26/17 2150   05/27/17 0500  CBC  Tomorrow morning,   R     05/26/17 2150   05/26/17 2251  Urine rapid drug screen (hosp performed)  STAT,   R     05/26/17 2250      Vitals/Pain Today's Vitals   05/26/17 2055 05/26/17 2100 05/26/17 2322 05/26/17 2330  BP: (!) 204/96 Marland Kitchen)  207/98 (!) 168/86 (!) 166/84  Pulse: 75   (!) 53  Resp: 16     Temp:      TempSrc:       SpO2: 100%   100%  Weight:      Height:        Isolation Precautions No active isolations  Medications Medications  insulin aspart protamine- aspart (NOVOLOG MIX 70/30) injection 30 Units (has no administration in time range)  lisinopril (PRINIVIL,ZESTRIL) tablet 40 mg (40 mg Oral Given 05/26/17 2058)  metoprolol tartrate (LOPRESSOR) tablet 12.5 mg (12.5 mg Oral Given 05/26/17 2026)  aspirin tablet 325 mg (325 mg Oral Given 05/26/17 2324)  atorvastatin (LIPITOR) tablet 40 mg (has no administration in time range)  ferrous sulfate tablet 325 mg (has no administration in time range)  insulin regular bolus via infusion 0-10 Units (has no administration in time range)  insulin regular (NOVOLIN R,HUMULIN R) 100 Units in sodium chloride 0.9 % 100 mL (1 Units/mL) infusion (5.4 Units/hr Intravenous New Bag/Given 05/26/17 2321)  dextrose 50 % solution 25 mL (has no administration in time range)  sodium chloride flush (NS) 0.9 % injection 3 mL (has no administration in time range)  sodium chloride flush (NS) 0.9 % injection 3 mL (has no administration in time range)  0.9 %  sodium chloride infusion (has no administration in time range)  0.9 %  sodium chloride infusion ( Intravenous New Bag/Given 05/26/17 2343)  sodium chloride 0.9 % bolus 2,000 mL (0 mLs Intravenous Stopped 05/26/17 2226)  insulin aspart (novoLOG) injection 12 Units (12 Units Subcutaneous Given 05/26/17 2025)  hydrALAZINE (APRESOLINE) injection 5 mg (5 mg Intravenous Given 05/26/17 2322)    Mobility walks

## 2017-05-26 NOTE — ED Notes (Signed)
Pt found refilling cup with tap water.   NAD noted.

## 2017-05-26 NOTE — H&P (Signed)
History and Physical    [name] UDJ:[id number] DOB: [birth year] DOA: 05/26/2017  PCP: [name], NP  Patient coming from: Home  Chief Complaint: High sugar  HPI: Mike [name] is a 57 y.o. male with medical history significant of homelessness, diabetes, hypertension comes in for the 62nd ED visit this year for high sugar.  Patient's glucose over thousand.  Patient systolic blood pressure over 200.  He says he takes his medications but I seriously doubt.  Patient is referred for admission for severe hyperglycemia and uncontrolled blood pressure.  Patient is extremely poor historian.  Review of Systems: As per HPI otherwise 10 point review of systems negative.   Past Medical History:  Diagnosis Date  . Anxiety   . Anxiety   . Chronic lower back pain   . Depression   . DKA (diabetic ketoacidoses) ([place]) 07/30/2016  . Hyperlipemia   . Hypertension   . Migraine    "last one was ~ 4 yr ago" (12/23/2014)  . Seizures ([place])    "related to pills for anxiety; if I don't take the pills I'm suppose to take I'll have them" (12/23/2014)  . Type II diabetes mellitus ([place])     Past Surgical History:  Procedure Laterality Date  . NO PAST SURGERIES       reports that he has been smoking cigarettes.  He has a 4.00 pack-year smoking history. He has never used smokeless tobacco. He reports that he drinks about 1.2 oz of alcohol per week. He reports that he does not use drugs.  Allergies  Allergen Reactions  . Ibuprofen Nausea And Vomiting and Other (See Comments)    Makes the patient feel bloated also  . Tylenol [Acetaminophen] Nausea And Vomiting and Other (See Comments)    Makes the patient feel bloated also    Family History  Problem Relation Age of Onset  . Diabetes Mellitus II Mother   . Diabetes Mellitus II Father     Prior to Admission medications   Medication Sig Start Date End Date Taking? Authorizing Provider  aspirin 325 MG tablet Take 1 tablet (325 mg total)  by mouth daily. 03/08/17  Yes Georgette Shell, MD  atorvastatin (LIPITOR) 40 MG tablet Take 1 tablet (40 mg total) by mouth daily. 01/26/17  Yes Cardama, Grayce Sessions, MD  blood glucose meter kit and supplies KIT Dispense based on patient and insurance preference. Use up to four times daily as directed. (FOR ICD-9 250.00, 250.01). 11/22/16  Yes Velvet Bathe, MD  insulin aspart protamine- aspart (NOVOLOG MIX 70/30) (70-30) 100 UNIT/ML injection Inject 0.3 mLs (30 Units total) into the skin 2 (two) times daily with a meal. 04/30/17  Yes Dorie Rank, MD  lisinopril (PRINIVIL,ZESTRIL) 40 MG tablet Take 1 tablet (40 mg total) by mouth daily. 04/30/17  Yes Dorie Rank, MD  metoprolol tartrate (LOPRESSOR) 25 MG tablet Take 0.5 tablets (12.5 mg total) by mouth 2 (two) times daily. 03/07/17  Yes Georgette Shell, MD  ferrous sulfate 325 (65 FE) MG tablet Take 1 tablet (325 mg total) daily by mouth. Patient taking differently: Take 325 mg by mouth daily as needed (low iron).  11/23/16   Ward, Delice Bison, DO    Physical Exam: Vitals:   05/26/17 1757 05/26/17 1827 05/26/17 2055 05/26/17 2100  BP:  (!) 183/78 (!) 204/96 (!) 207/98  Pulse:  78 75   Resp:  18 16   Temp:  98.3 F (36.8 C)    TempSrc:  Oral  SpO2:  100% 100%   Weight: 77.1 kg (170 lb)     Height: 6' 4" (1.93 m)         Constitutional: NAD, calm, comfortable Vitals:   05/26/17 1757 05/26/17 1827 05/26/17 2055 05/26/17 2100  BP:  (!) 183/78 (!) 204/96 (!) 207/98  Pulse:  78 75   Resp:  18 16   Temp:  98.3 F (36.8 C)    TempSrc:  Oral    SpO2:  100% 100%   Weight: 77.1 kg (170 lb)     Height: 6' 4" (1.93 m)      Eyes: PERRL, lids and conjunctivae normal ENMT: Mucous membranes are moist. Posterior pharynx clear of any exudate or lesions.Normal dentition.  Neck: normal, supple, no masses, no thyromegaly Respiratory: clear to auscultation bilaterally, no wheezing, no crackles. Normal respiratory effort. No accessory muscle  use.  Cardiovascular: Regular rate and rhythm, no murmurs / rubs / gallops. No extremity edema. 2+ pedal pulses. No carotid bruits.  Abdomen: no tenderness, no masses palpated. No hepatosplenomegaly. Bowel sounds positive.  Musculoskeletal: no clubbing / cyanosis. No joint deformity upper and lower extremities. Good ROM, no contractures. Normal muscle tone.  Skin: no rashes, lesions, ulcers. No induration Neurologic: CN 2-12 grossly intact. Sensation intact, DTR normal. Strength 5/5 in all 4.  Psychiatric: Normal judgment and insight. Alert and oriented x 3. Normal mood.    Labs on Admission: I have personally reviewed following labs and imaging studies  CBC: Recent Labs  Lab 05/19/17 2305  05/23/17 0034 05/23/17 2009 05/24/17 2011 05/24/17 2105 05/25/17 1952 05/26/17 1853 05/26/17 2023  WBC 5.9  --  4.6 5.6  --  5.7  --   --  5.5  NEUTROABS 4.0  --  2.3 3.1  --  4.1  --   --  4.4  HGB 10.6*   < > 9.9* 9.4* 12.9* 11.0* 11.9* 13.3 10.9*  HCT 32.9*   < > 30.6* 29.6* 38.0* 34.4* 35.0* 39.0 34.4*  MCV 78.7  --  80.5 79.4  --  80.6  --   --  79.8  PLT 327  --  329 291  --  321  --   --  313   < > = values in this interval not displayed.   Basic Metabolic Panel: Recent Labs  Lab 05/19/17 2305  05/23/17 0034 05/23/17 2009 05/24/17 2011 05/24/17 2105 05/25/17 1952 05/26/17 1853 05/26/17 2023  NA 130*   < > 134* 131* 123* 123* 127* 128* 129*  K 4.2   < > 4.2 3.5 5.2* 4.1 4.3 4.1 4.4  CL 92*   < > 96* 95* 87* 85* 89* 93* 92*  CO2 28  --  28 26  --  25  --   --  25  GLUCOSE 789*   < > 760* 538* >700* 949* >700* >700* 1,103*  BUN 16   < > _0 CREATININE 1.23   < > 1.15 0.76 0.80 0.97 0.80 0.60* 0.87  CALCIUM 8.9  --  9.2 9.1  --  9.1  --   --  9.1   < > = values in this interval not displayed.   GFR: Estimated Creatinine Clearance: 104.6 mL/min (by C-G formula based on SCr of 0.87 mg/dL). Liver Function Tests: Recent Labs  Lab 05/23/17 2009  AST 20  ALT  24  ALKPHOS 96  BILITOT 0.5  PROT 6.2*  ALBUMIN 3.1*   No results for  input(s): LIPASE, AMYLASE in the last 168 hours. No results for input(s): AMMONIA in the last 168 hours. Coagulation Profile: No results for input(s): INR, PROTIME in the last 168 hours. Cardiac Enzymes: No results for input(s): CKTOTAL, CKMB, CKMBINDEX, TROPONINI in the last 168 hours. BNP (last 3 results) No results for input(s): PROBNP in the last 8760 hours. HbA1C: No results for input(s): HGBA1C in the last 72 hours. CBG: Recent Labs  Lab 05/23/17 2113 05/24/17 1917 05/24/17 2352 05/25/17 0403 05/25/17 2245  GLUCAP 515* >600* >600* 199* 593*   Lipid Profile: No results for input(s): CHOL, HDL, LDLCALC, TRIG, CHOLHDL, LDLDIRECT in the last 72 hours. Thyroid Function Tests: No results for input(s): TSH, T4TOTAL, FREET4, T3FREE, THYROIDAB in the last 72 hours. Anemia Panel: No results for input(s): VITAMINB12, FOLATE, FERRITIN, TIBC, IRON, RETICCTPCT in the last 72 hours. Urine analysis:    Component Value Date/Time   COLORURINE STRAW (A) 05/23/2017 1958   APPEARANCEUR CLEAR 05/23/2017 1958   LABSPEC 1.027 05/23/2017 1958   PHURINE 6.0 05/23/2017 1958   GLUCOSEU >=500 (A) 05/23/2017 1958   HGBUR NEGATIVE 05/23/2017 [place] NEGATIVE 05/23/2017 1958   KETONESUR NEGATIVE 05/23/2017 1958   PROTEINUR NEGATIVE 05/23/2017 1958   UROBILINOGEN 1.0 02/07/2011 1605   NITRITE NEGATIVE 05/23/2017 1958   LEUKOCYTESUR NEGATIVE 05/23/2017 1958   Sepsis Labs: !!!!!!!!!!!!!!!!!!!!!!!!!!!!!!!!!!!!!!!!!!!! _0 (procalcitonin:4,lacticidven:4) )No results found for this or any previous visit (from the past 240 hour(s)).   Radiological Exams on Admission: No results found.  Old chart review Case discussed with EDP   Assessment/Plan 57 year old male with noncompliance in homelessness comes in with hyperglycemia and uncontrolled blood pressure Principal Problem:   Hyperglycemia-not in DKA  patient will be placed on insulin drip.  Hourly glucose checks.  Placed back on his long-acting insulin.  Observe in the stepdown unit due to insulin drip.  Active Problems:   Hypertensive urgency-placed back on his lisinopril and metoprolol    Homelessness-obtain social work consult    DM (diabetes mellitus) (HCC)-placed on insulin drip and resuming Lantus at this time    History of stroke-noted    Noncompliance-noted     DVT prophylaxis: SCDs Code Status: Full Family Communication: None Disposition Plan: Per day team  consults called: None Admission status: Observation   , A MD [facility]  If 7PM-7AM, please contact night-coverage [email or web address] Password [facility]  05/26/2017, 9:47 PM

## 2017-05-26 NOTE — ED Triage Notes (Addendum)
BIBA with [facility] 564. C/o elevated BP.  196/86 . 80HR 100% 16RR

## 2017-05-26 NOTE — ED Notes (Signed)
Pt given new scrubs and underwear.

## 2017-05-26 NOTE — ED Provider Notes (Signed)
[place] DEPT Provider Note   CSN: [id number] Arrival date & time: 05/26/17  1733     History   Chief Complaint Chief Complaint  Patient presents with  . Hyperglycemia  . Hypertension    HPI Mike [name] is a 57 y.o. male.  57 year old male presents here with hyperglycemia.  Has a history of insulin pen diabetes and is well-known to this department.  Was seen by myself yesterday for similar symptoms.  When questioned directly if he is ingesting sugar patient is asked the question.  Blood sugar noted to be 564 prior to arrival.  Was also noted to be hypertensive.  Patient noncompliant with his medications     Past Medical History:  Diagnosis Date  . Anxiety   . Anxiety   . Chronic lower back pain   . Depression   . DKA (diabetic ketoacidoses) ([place]) 07/30/2016  . Hyperlipemia   . Hypertension   . Migraine    "last one was ~ 4 yr ago" (12/23/2014)  . Seizures ([place])    "related to pills for anxiety; if I don't take the pills I'm suppose to take I'll have them" (12/23/2014)  . Type II diabetes mellitus [facility])     Patient Active Problem List   Diagnosis Date Noted  . AKI (acute kidney injury) ([place]) 03/04/2017  . Malnutrition of moderate degree 02/08/2017  . DKA (diabetic ketoacidoses) ([place]) 02/07/2017  . History of stroke 02/07/2017  . Hyperphosphatemia 12/13/2016  . Evaluation by psychiatric service required 11/30/2016  . Lactic acidosis 11/18/2016  . Normocytic anemia 11/06/2016  . HLD (hyperlipidemia) 11/06/2016  . Adjustment disorder with other symptoms 11/05/2016  . Uncontrolled type 2 diabetes mellitus with hyperosmolar nonketotic hyperglycemia ([place]) 08/24/2016  . Essential hypertension 08/24/2016  . Acute ischemic stroke ([place])   . Protein-calorie malnutrition, severe 12/24/2014  . Homelessness 12/23/2014  . Hypertensive urgency 12/23/2014  . DM (diabetes mellitus) ([place]) 12/23/2014  . Intermittent palpitations 12/23/2014    . Tobacco abuse 12/23/2014  . Pleuritic chest pain 12/23/2014  . Abnormal EKG 12/23/2014  . Type II diabetes mellitus with renal manifestations [facility])     Past Surgical History:  Procedure Laterality Date  . NO PAST SURGERIES          Home Medications    Prior to Admission medications   Medication Sig Start Date End Date Taking? Authorizing Provider  aspirin 325 MG tablet Take 1 tablet (325 mg total) by mouth daily. 03/08/17   Georgette Shell, MD  atorvastatin (LIPITOR) 40 MG tablet Take 1 tablet (40 mg total) by mouth daily. 01/26/17   Cardama, Grayce Sessions, MD  blood glucose meter kit and supplies KIT Dispense based on patient and insurance preference. Use up to four times daily as directed. (FOR ICD-9 250.00, 250.01). 11/22/16   Velvet Bathe, MD  ferrous sulfate 325 (65 FE) MG tablet Take 1 tablet (325 mg total) daily by mouth. Patient taking differently: Take 325 mg by mouth daily as needed (low iron).  11/23/16   Ward, Delice Bison, DO  insulin aspart protamine- aspart (NOVOLOG MIX 70/30) (70-30) 100 UNIT/ML injection Inject 0.3 mLs (30 Units total) into the skin 2 (two) times daily with a meal. 04/30/17   Dorie Rank, MD  lisinopril (PRINIVIL,ZESTRIL) 40 MG tablet Take 1 tablet (40 mg total) by mouth daily. 04/30/17   Dorie Rank, MD  metoprolol tartrate (LOPRESSOR) 25 MG tablet Take 0.5 tablets (12.5 mg total) by mouth 2 (two) times daily. 03/07/17  Georgette Shell, MD    Family History Family History  Problem Relation Age of Onset  . Diabetes Mellitus II Mother   . Diabetes Mellitus II Father     Social History Social History   Tobacco Use  . Smoking status: Current Every Day Smoker    Packs/day: 0.10    Years: 40.00    Pack years: 4.00    Types: Cigarettes  . Smokeless tobacco: Never Used  Substance Use Topics  . Alcohol use: Yes    Alcohol/week: 1.2 oz    Types: 2 Cans of beer per week  . Drug use: No    Comment: 12/23/2014 "stopped ~ 10 yrs ago"      Allergies   Ibuprofen and Tylenol [acetaminophen]   Review of Systems Review of Systems  Unable to perform ROS: Acuity of condition     Physical Exam Updated Vital Signs BP (!) 183/78 (BP Location: Right Arm)   Pulse 78   Temp 98.3 F (36.8 C) (Oral)   Resp 18   Ht 1.93 m (_0 )   Wt 77.1 kg (170 lb)   SpO2 100%   BMI 20.69 kg/m   Physical Exam  Constitutional: He is oriented to person, place, and time. He appears cachectic. He is cooperative.  Non-toxic appearance. No distress.  HENT:  Head: Normocephalic and atraumatic.  Eyes: Pupils are equal, round, and reactive to light. Conjunctivae, EOM and lids are normal.  Neck: Normal range of motion. Neck supple. No tracheal deviation present. No thyroid mass present.  Cardiovascular: Normal rate, regular rhythm and normal heart sounds. Exam reveals no gallop.  No murmur heard. Pulmonary/Chest: Effort normal and breath sounds normal. No stridor. No respiratory distress. He has no decreased breath sounds. He has no wheezes. He has no rhonchi. He has no rales.  Abdominal: Soft. Normal appearance and bowel sounds are normal. He exhibits no distension. There is no tenderness. There is no rebound and no CVA tenderness.  Musculoskeletal: Normal range of motion. He exhibits no edema or tenderness.  Neurological: He is alert and oriented to person, place, and time. He has normal strength. No cranial nerve deficit or sensory deficit. Gait normal. GCS eye subscore is 4. GCS verbal subscore is 5. GCS motor subscore is 6.  Skin: Skin is warm and dry. No abrasion and no rash noted.  Psychiatric: His affect is blunt. His speech is delayed. He is withdrawn.  Nursing note and vitals reviewed.    ED Treatments / Results  Labs (all labs ordered are listed, but only abnormal results are displayed) Labs Reviewed  I-STAT CHEM 8, ED - Abnormal; Notable for the following components:      Result Value   Sodium 128 (*)    Chloride 93 (*)     Creatinine, Ser 0.60 (*)    Glucose, Bld >700 (*)    Calcium, Ion 1.09 (*)    All other components within normal limits    EKG None  Radiology No results found.  Procedures Procedures (including critical care time)  Medications Ordered in ED Medications  insulin aspart protamine- aspart (NOVOLOG MIX 70/30) injection 30 Units (has no administration in time range)  lisinopril (PRINIVIL,ZESTRIL) tablet 40 mg (has no administration in time range)  metoprolol tartrate (LOPRESSOR) tablet 12.5 mg (has no administration in time range)  sodium chloride 0.9 % bolus 2,000 mL (has no administration in time range)  0.9 %  sodium chloride infusion (has no administration in time range)  insulin aspart (  novoLOG) injection 12 Units (has no administration in time range)     Initial Impression / Assessment and Plan / ED Course  I have reviewed the triage vital signs and the nursing notes.  Pertinent labs & imaging results that were available during my care of the patient were reviewed by me and considered in my medical decision making (see chart for details).     Patient given IV fluids here 2 L.  Blood sugar came back at 1103.  Patient be placed on glucose stabilizer.  Also found to be hypertensive and given his regular dose of Zestril and Lopressor.  Patient has 66 visits with the last 6 months for similar complaint.  Concerned about patient's competence at this time.  This should be evaluated during his hospitalization.  Will admit to the hospitalist service  Final Clinical Impressions(s) / ED Diagnoses   Final diagnoses:  None    ED Discharge Orders    None       Lacretia Leigh, MD 05/26/17 2124

## 2017-05-27 ENCOUNTER — Encounter (HOSPITAL_COMMUNITY): Payer: Self-pay

## 2017-05-27 ENCOUNTER — Other Ambulatory Visit: Payer: Self-pay

## 2017-05-27 ENCOUNTER — Emergency Department (HOSPITAL_COMMUNITY)
Admission: EM | Admit: 2017-05-27 | Discharge: 2017-05-28 | Disposition: A | Discharge status: Home / Self Care | Payer: Self-pay | Attending: Emergency Medicine | Admitting: Emergency Medicine

## 2017-05-27 DIAGNOSIS — E1165 Type 2 diabetes mellitus with hyperglycemia: Secondary | ICD-10-CM | POA: Insufficient documentation

## 2017-05-27 DIAGNOSIS — Z8673 Personal history of transient ischemic attack (TIA), and cerebral infarction without residual deficits: Secondary | ICD-10-CM | POA: Insufficient documentation

## 2017-05-27 DIAGNOSIS — F17210 Nicotine dependence, cigarettes, uncomplicated: Secondary | ICD-10-CM | POA: Insufficient documentation

## 2017-05-27 DIAGNOSIS — Z7982 Long term (current) use of aspirin: Secondary | ICD-10-CM | POA: Insufficient documentation

## 2017-05-27 DIAGNOSIS — Z794 Long term (current) use of insulin: Secondary | ICD-10-CM | POA: Insufficient documentation

## 2017-05-27 DIAGNOSIS — I10 Essential (primary) hypertension: Secondary | ICD-10-CM | POA: Insufficient documentation

## 2017-05-27 DIAGNOSIS — R739 Hyperglycemia, unspecified: Secondary | ICD-10-CM

## 2017-05-27 LAB — CBC
HEMATOCRIT: 28.8 % — AB (ref 39.0–52.0)
Hemoglobin: 9.4 g/dL — ABNORMAL LOW (ref 13.0–17.0)
MCH: 25.5 pg — AB (ref 26.0–34.0)
MCHC: 32.6 g/dL (ref 30.0–36.0)
MCV: 78 fL (ref 78.0–100.0)
Platelets: 273 10*3/uL (ref 150–400)
RBC: 3.69 MIL/uL — ABNORMAL LOW (ref 4.22–5.81)
RDW: 13.3 % (ref 11.5–15.5)
WBC: 6.6 10*3/uL (ref 4.0–10.5)

## 2017-05-27 LAB — BASIC METABOLIC PANEL
ANION GAP: 10 (ref 5–15)
Anion gap: 10 (ref 5–15)
BUN: 10 mg/dL (ref 6–20)
BUN: 9 mg/dL (ref 6–20)
CALCIUM: 8.8 mg/dL — AB (ref 8.9–10.3)
CO2: 25 mmol/L (ref 22–32)
CO2: 26 mmol/L (ref 22–32)
CREATININE: 0.49 mg/dL — AB (ref 0.61–1.24)
Calcium: 8.8 mg/dL — ABNORMAL LOW (ref 8.9–10.3)
Chloride: 101 mmol/L (ref 101–111)
Chloride: 99 mmol/L — ABNORMAL LOW (ref 101–111)
Creatinine, Ser: 0.53 mg/dL — ABNORMAL LOW (ref 0.61–1.24)
GFR calc Af Amer: >60 mL/min (ref >60)
GFR calc Af Amer: >60 mL/min (ref >60)
GFR calc non Af Amer: >60 mL/min (ref >60)
GLUCOSE: 252 mg/dL — AB (ref 65–99)
Glucose, Bld: 167 mg/dL — ABNORMAL HIGH (ref 65–99)
POTASSIUM: 2.8 mmol/L — AB (ref 3.5–5.1)
Potassium: 3.1 mmol/L — ABNORMAL LOW (ref 3.5–5.1)
Sodium: 136 mmol/L (ref 135–145)
Sodium: 135 mmol/L (ref 135–145)

## 2017-05-27 LAB — GLUCOSE, CAPILLARY
GLUCOSE-CAPILLARY: 122 mg/dL — AB (ref 65–99)
GLUCOSE-CAPILLARY: 105 mg/dL — AB (ref 65–99)
Glucose-Capillary: 121 mg/dL — ABNORMAL HIGH (ref 65–99)
Glucose-Capillary: 164 mg/dL — ABNORMAL HIGH (ref 65–99)
Glucose-Capillary: 287 mg/dL — ABNORMAL HIGH (ref 65–99)
Glucose-Capillary: 325 mg/dL — ABNORMAL HIGH (ref 65–99)
Glucose-Capillary: 552 mg/dL (ref 65–99)

## 2017-05-27 LAB — CBC WITH DIFFERENTIAL/PLATELET
BASOS ABS: 0 10*3/uL (ref 0.0–0.1)
Basophils Relative: 0 %
EOS PCT: 1 %
Eosinophils Absolute: 0.1 10*3/uL (ref 0.0–0.7)
HCT: 29 % — ABNORMAL LOW (ref 39.0–52.0)
HEMOGLOBIN: 9.2 g/dL — AB (ref 13.0–17.0)
Lymphocytes Relative: 24 %
Lymphs Abs: 1.2 10*3/uL (ref 0.7–4.0)
MCH: 25.3 pg — ABNORMAL LOW (ref 26.0–34.0)
MCHC: 31.7 g/dL (ref 30.0–36.0)
MCV: 79.9 fL (ref 78.0–100.0)
Monocytes Absolute: 0.3 10*3/uL (ref 0.1–1.0)
Monocytes Relative: 6 %
NEUTROS ABS: 3.4 10*3/uL (ref 1.7–7.7)
NEUTROS PCT: 69 %
PLATELETS: 293 10*3/uL (ref 150–400)
RBC: 3.63 MIL/uL — AB (ref 4.22–5.81)
RDW: 13.5 % (ref 11.5–15.5)
WBC: 5 10*3/uL (ref 4.0–10.5)

## 2017-05-27 LAB — I-STAT CHEM 8, ED
BUN: 19 mg/dL (ref 6–20)
CREATININE: 0.6 mg/dL — AB (ref 0.61–1.24)
Calcium, Ion: 1.14 mmol/L — ABNORMAL LOW (ref 1.15–1.40)
Chloride: 94 mmol/L — ABNORMAL LOW (ref 101–111)
Glucose, Bld: 665 mg/dL (ref 65–99)
HEMATOCRIT: 31 % — AB (ref 39.0–52.0)
HEMOGLOBIN: 10.5 g/dL — AB (ref 13.0–17.0)
Potassium: 4.3 mmol/L (ref 3.5–5.1)
Sodium: 130 mmol/L — ABNORMAL LOW (ref 135–145)
TCO2: 25 mmol/L (ref 22–32)

## 2017-05-27 LAB — RAPID URINE DRUG SCREEN, HOSP PERFORMED
Amphetamines: NOT DETECTED
BARBITURATES: NOT DETECTED
Benzodiazepines: NOT DETECTED
Cocaine: NOT DETECTED
Opiates: NOT DETECTED
Tetrahydrocannabinol: NOT DETECTED

## 2017-05-27 LAB — MAGNESIUM: Magnesium: 1.9 mg/dL (ref 1.7–2.4)

## 2017-05-27 LAB — MRSA PCR SCREENING: MRSA by PCR: NEGATIVE

## 2017-05-27 LAB — CBG MONITORING, ED: Glucose-Capillary: >600 mg/dL (ref 65–99)

## 2017-05-27 MED ORDER — INSULIN ASPART 100 UNIT/ML ~~LOC~~ SOLN
0.0000 [IU] | Freq: Three times a day (TID) | SUBCUTANEOUS | Status: DC
  Administered 2017-05-27: 5 [IU] via SUBCUTANEOUS

## 2017-05-27 MED ORDER — POTASSIUM CHLORIDE CRYS ER 20 MEQ PO TBCR
40.0000 meq | EXTENDED_RELEASE_TABLET | Freq: Once | ORAL | Status: AC
  Administered 2017-05-27: 40 meq via ORAL
  Filled 2017-05-27: qty 2

## 2017-05-27 MED ORDER — HYDROCHLOROTHIAZIDE 12.5 MG PO CAPS: 12.5000 mg | ORAL_CAPSULE | Freq: Every day | ORAL | Status: DC

## 2017-05-27 MED ORDER — SODIUM CHLORIDE 0.9 % IV BOLUS
1000.0000 mL | Freq: Once | INTRAVENOUS | Status: AC
  Administered 2017-05-27: 1000 mL via INTRAVENOUS

## 2017-05-27 MED ORDER — DEXTROSE-NACL 5-0.45 % IV SOLN: INTRAVENOUS | Status: DC

## 2017-05-27 MED ORDER — HYDRALAZINE HCL 20 MG/ML IJ SOLN
5.0000 mg | Freq: Four times a day (QID) | INTRAMUSCULAR | Status: DC | PRN
  Administered 2017-05-27 (×2): 5 mg via INTRAVENOUS
  Filled 2017-05-27 (×2): qty 1

## 2017-05-27 MED ORDER — POTASSIUM CHLORIDE CRYS ER 20 MEQ PO TBCR: 40.0000 meq | EXTENDED_RELEASE_TABLET | Freq: Once | ORAL | Status: DC

## 2017-05-27 MED ORDER — AMLODIPINE BESYLATE 10 MG PO TABS: 10.0000 mg | ORAL_TABLET | Freq: Every day | ORAL | 0 refills | Status: DC

## 2017-05-27 MED ORDER — POTASSIUM CHLORIDE CRYS ER 20 MEQ PO TBCR
40.0000 meq | EXTENDED_RELEASE_TABLET | ORAL | Status: AC
  Administered 2017-05-27 (×2): 40 meq via ORAL
  Filled 2017-05-27 (×2): qty 2

## 2017-05-27 MED ORDER — INSULIN ASPART 100 UNIT/ML ~~LOC~~ SOLN
10.0000 [IU] | Freq: Once | SUBCUTANEOUS | Status: AC
  Administered 2017-05-27: 10 [IU] via SUBCUTANEOUS
  Filled 2017-05-27: qty 1

## 2017-05-27 MED ORDER — AMLODIPINE BESYLATE 10 MG PO TABS
10.0000 mg | ORAL_TABLET | Freq: Every day | ORAL | Status: DC
  Administered 2017-05-27: 10 mg via ORAL
  Filled 2017-05-27: qty 1

## 2017-05-27 NOTE — Progress Notes (Signed)
CSW informed by MD of pt DC today- MD requested CSW check in to see if pt needs shelter resources.  CSW met with pt he denies need for resources at this time- is familiar with local options  CSW provided bus passes to RN for pt when he is ready to leave  CSW signing off  Jorge Ny, [place] Social Worker [phone]

## 2017-05-27 NOTE — ED Provider Notes (Signed)
[place] DEPT Provider Note   CSN: [id number] Arrival date & time: 05/27/17  2153     History   Chief Complaint Chief Complaint  Patient presents with  . Hyperglycemia    HPI Mike [name] is a 57 y.o. male history of uncontrolled type 2 diabetes, presenting to the ED via EMS for hyperglycemia.  Patient was 50 ED visits for similar complaints the last 6 months.  Of note, patient discharged this morning after being admitted for hyperglycemia requiring glucose stabilizer.  Patient was picked up by EMS today, and was reportedly observed eating chocolate.  He states he also had a Diet Coke today, however does not answer many questions.  Does state that he feels fine and denies complaints.  The history is provided by the patient and medical records.    Past Medical History:  Diagnosis Date  . Anxiety   . Anxiety   . Chronic lower back pain   . Depression   . DKA (diabetic ketoacidoses) ([place]) 07/30/2016  . Hyperlipemia   . Hypertension   . Migraine    "last one was ~ 4 yr ago" (12/23/2014)  . Seizures ([place])    "related to pills for anxiety; if I don't take the pills I'm suppose to take I'll have them" (12/23/2014)  . Type II diabetes mellitus [facility])     Patient Active Problem List   Diagnosis Date Noted  . Hyperglycemia 05/26/2017  . Noncompliance 05/26/2017  . AKI (acute kidney injury) ([place]) 03/04/2017  . Malnutrition of moderate degree 02/08/2017  . DKA (diabetic ketoacidoses) ([place]) 02/07/2017  . History of stroke 02/07/2017  . Hyperphosphatemia 12/13/2016  . Evaluation by psychiatric service required 11/30/2016  . Lactic acidosis 11/18/2016  . Normocytic anemia 11/06/2016  . HLD (hyperlipidemia) 11/06/2016  . Adjustment disorder with other symptoms 11/05/2016  . Uncontrolled type 2 diabetes mellitus with hyperosmolar nonketotic hyperglycemia ([place]) 08/24/2016  . Essential hypertension 08/24/2016  . Acute ischemic stroke ([place])   .  Protein-calorie malnutrition, severe 12/24/2014  . Homelessness 12/23/2014  . Hypertensive urgency 12/23/2014  . DM (diabetes mellitus) ([place]) 12/23/2014  . Intermittent palpitations 12/23/2014  . Tobacco abuse 12/23/2014  . Pleuritic chest pain 12/23/2014  . Abnormal EKG 12/23/2014  . Type II diabetes mellitus with renal manifestations [facility])     Past Surgical History:  Procedure Laterality Date  . NO PAST SURGERIES          Home Medications    Prior to Admission medications   Medication Sig Start Date End Date Taking? Authorizing Provider  amLODipine (NORVASC) 10 MG tablet Take 1 tablet (10 mg total) by mouth daily. 05/28/17  Yes Dessa Phi, DO  aspirin 325 MG tablet Take 1 tablet (325 mg total) by mouth daily. 03/08/17  Yes Georgette Shell, MD  atorvastatin (LIPITOR) 40 MG tablet Take 1 tablet (40 mg total) by mouth daily. 01/26/17  Yes Cardama, Grayce Sessions, MD  blood glucose meter kit and supplies KIT Dispense based on patient and insurance preference. Use up to four times daily as directed. (FOR ICD-9 250.00, 250.01). 11/22/16  Yes Velvet Bathe, MD  ferrous sulfate 325 (65 FE) MG tablet Take 1 tablet (325 mg total) daily by mouth. Patient taking differently: Take 325 mg by mouth daily as needed (low iron).  11/23/16  Yes Ward, Cyril Mourning N, DO  insulin aspart protamine- aspart (NOVOLOG MIX 70/30) (70-30) 100 UNIT/ML injection Inject 0.3 mLs (30 Units total) into the skin 2 (two) times daily with a  meal. 04/30/17  Yes Dorie Rank, MD  lisinopril (PRINIVIL,ZESTRIL) 40 MG tablet Take 1 tablet (40 mg total) by mouth daily. 04/30/17  Yes Dorie Rank, MD  metoprolol tartrate (LOPRESSOR) 25 MG tablet Take 0.5 tablets (12.5 mg total) by mouth 2 (two) times daily. 03/07/17  Yes Georgette Shell, MD    Family History Family History  Problem Relation Age of Onset  . Diabetes Mellitus II Mother   . Diabetes Mellitus II Father     Social History Social History   Tobacco Use  .  Smoking status: Current Every Day Smoker    Packs/day: 0.10    Years: 40.00    Pack years: 4.00    Types: Cigarettes  . Smokeless tobacco: Never Used  Substance Use Topics  . Alcohol use: Yes    Alcohol/week: 1.2 oz    Types: 2 Cans of beer per week  . Drug use: No    Comment: 12/23/2014 "stopped ~ 10 yrs ago"     Allergies   Ibuprofen and Tylenol [acetaminophen]   Review of Systems Review of Systems  All other systems reviewed and are negative.    Physical Exam Updated Vital Signs BP (!) 179/88   Pulse 70   Temp 98.4 F (36.9 C) (Oral)   Resp 16   Ht 6' (1.829 m)   Wt 77.1 kg (170 lb)   SpO2 100%   BMI 23.06 kg/m   Physical Exam  Constitutional: He appears well-developed and well-nourished. No distress.  Poor hygiene.  Resting comfortably.  HENT:  Head: Normocephalic and atraumatic.  Mouth/Throat: Oropharynx is clear and moist.  Eyes: Conjunctivae are normal.  Cardiovascular: Normal rate and regular rhythm.  Pulmonary/Chest: Effort normal and breath sounds normal. No respiratory distress.  Abdominal: Soft. Bowel sounds are normal. He exhibits no distension and no mass. There is no tenderness.  Neurological: He is alert.  Skin: Skin is warm.  Psychiatric: He has a normal mood and affect. His behavior is normal.  Nursing note and vitals reviewed.    ED Treatments / Results  Labs (all labs ordered are listed, but only abnormal results are displayed) Labs Reviewed  BASIC METABOLIC PANEL - Abnormal; Notable for the following components:      Result Value   Sodium 130 (*)    Chloride 97 (*)    Glucose, Bld 699 (*)    Calcium 8.8 (*)    All other components within normal limits  CBC WITH DIFFERENTIAL/PLATELET - Abnormal; Notable for the following components:   RBC 3.63 (*)    Hemoglobin 9.2 (*)    HCT 29.0 (*)    MCH 25.3 (*)    All other components within normal limits  I-STAT CHEM 8, ED - Abnormal; Notable for the following components:   Sodium 130  (*)    Chloride 94 (*)    Creatinine, Ser 0.60 (*)    Glucose, Bld 665 (*)    Calcium, Ion 1.14 (*)    Hemoglobin 10.5 (*)    HCT 31.0 (*)    All other components within normal limits  CBG MONITORING, ED - Abnormal; Notable for the following components:   Glucose-Capillary >600 (*)    All other components within normal limits  CBG MONITORING, ED - Abnormal; Notable for the following components:   Glucose-Capillary 456 (*)    All other components within normal limits    EKG None  Radiology No results found.  Procedures Procedures (including critical care time)  Medications Ordered in  ED Medications  sodium chloride 0.9 % bolus 1,000 mL (0 mLs Intravenous Stopped 05/28/17 0210)  insulin aspart (novoLOG) injection 10 Units (10 Units Subcutaneous Given 05/27/17 2344)     Initial Impression / Assessment and Plan / ED Course  I have reviewed the triage vital signs and the nursing notes.  Pertinent labs & imaging results that were available during my care of the patient were reviewed by me and considered in my medical decision making (see chart for details).  Clinical Course as of May 29 251  Mon May 28, 2017  0011 Pt does not appear to be in DKA. Will tx hyperglycemia with goal <225  Basic metabolic panel(!!) [JR]  6720 Per RN, patient was observed eating sugar packets that he took from his pocket.    [JR]    Clinical Course User Index [JR] Eunice Oldaker, [place] N, PA-C    Pt with multiple ED visits for hyperglycemia, presenting via EMS w hyperglycemia. CBG on arrival 665. Normal Cr. No evidence of DKA. Pt treated with IVF and 10u insulin with improvement. Pt was observed in the ED eating sugar packets from his pocket. Encouraged pt maintain low carb/sugar diet. Safe for discharge.  Discussed results, findings, treatment and follow up. Patient advised of return precautions. Patient verbalized understanding and agreed with plan.   Final Clinical Impressions(s) / ED Diagnoses     Final diagnoses:  Hyperglycemia    ED Discharge Orders    None       Adryen Cookson, [place] N, PA-C 05/28/17 9198    Tegeler, Gwenyth Allegra, MD 05/28/17 [phone]

## 2017-05-27 NOTE — ED Triage Notes (Signed)
Was here yesterday for same hyperglycemia pt homeless.

## 2017-05-27 NOTE — Progress Notes (Signed)
RN went through discharge instructions with patient. Patient stated that he has a glucose meter as well as all his medications available for his use. Patient stated [facility]. Opportunity for questions provided, no questions asked.  Chaplain contacted in regards to getting clean clothes for patient. Patient's old clothes were wet and smelled like urine.  Patient changed into clean clothes and old clothes were placed in bags. Patient choose to take old clothes with him. PIV removed.  NT transported patient to bus stop in wheelchair.

## 2017-05-27 NOTE — Discharge Summary (Addendum)
Physician Discharge Summary  Mike [name] UTM:[id number] DOB: 1960-09-22 DOA: 05/26/2017  PCP: Mike Coots, NP  Admit date: 05/26/2017 Discharge date: 05/27/2017  Recommendations for Outpatient Follow-up:  1. Follow up with PCP in 1 week 2. Please obtain BMP in 1 week.  Potassium was replaced prior to discharge home  Discharge Condition: Improved CODE STATUS: Full  Diet recommendation: Carb modified   Brief/Interim Summary: From H&P by Dr. Shanon [name]: Mike [name] is a 57 y.o. male with medical history significant of homelessness, diabetes, hypertension comes in for the 62nd ED visit this year for high blood sugar.  Patient's glucose over 1000.  Patient systolic blood pressure over 200.  He says he takes his medications but he has history of medical noncompliance with repeated ED visits and hospitalizations.  Patient is referred for admission for severe hyperglycemia and uncontrolled blood pressure.  Patient is extremely poor historian.  He was treated with IV fluids, glucose stabilizer with improvement in his blood sugar.  He was started on amlodipine for increased blood pressure.  Chart review state that patient's HCTZ was discontinued previously due to dehydration.  He was weaned off insulin drip and transition back to his NovoLog 70/30.  Potassium was replaced with 40 mEq x 3 prior to discharge.  He is stable for discharge, however remains at high risk for repeat ED visit and readmission due to medical noncompliance.  Discharge Diagnoses:  Principal Problem:   Hyperglycemia Active Problems:   Homelessness   Hypertensive urgency   DM (diabetes mellitus) ([place])   History of stroke   Noncompliance  Discharge Instructions  Discharge Instructions    Call MD for:  difficulty breathing, headache or visual disturbances   Complete by:  As directed    Call MD for:  extreme fatigue   Complete by:  As directed    Call MD for:  hives   Complete by:  As directed    Call MD for:   persistant dizziness or light-headedness   Complete by:  As directed    Call MD for:  persistant nausea and vomiting   Complete by:  As directed    Call MD for:  severe uncontrolled pain   Complete by:  As directed    Call MD for:  temperature >100.4   Complete by:  As directed    Diet Carb Modified   Complete by:  As directed    Discharge instructions   Complete by:  As directed    You were cared for by a hospitalist during your hospital stay. If you have any questions about your discharge medications or the care you received while you were in the hospital after you are discharged, you can call the unit and ask to speak with the hospitalist on call if the hospitalist that took care of you is not available. Once you are discharged, your primary care physician will handle any further medical issues. Please note that NO REFILLS for any discharge medications will be authorized once you are discharged, as it is imperative that you return to your primary care physician (or establish a relationship with a primary care physician if you do not have one) for your aftercare needs so that they can reassess your need for medications and monitor your lab values.   Increase activity slowly   Complete by:  As directed      Allergies as of 05/27/2017      Reactions   Ibuprofen Nausea And Vomiting, Other (See Comments)   Makes the  patient feel bloated also   Tylenol [acetaminophen] Nausea And Vomiting, Other (See Comments)   Makes the patient feel bloated also      Medication List    TAKE these medications   amLODipine 10 MG tablet Commonly known as:  NORVASC Take 1 tablet (10 mg total) by mouth daily. Start taking on:  05/28/2017   aspirin 325 MG tablet Take 1 tablet (325 mg total) by mouth daily.   atorvastatin 40 MG tablet Commonly known as:  LIPITOR Take 1 tablet (40 mg total) by mouth daily.   blood glucose meter kit and supplies Kit Dispense based on patient and insurance preference. Use  up to four times daily as directed. (FOR ICD-9 250.00, 250.01).   ferrous sulfate 325 (65 FE) MG tablet Take 1 tablet (325 mg total) daily by mouth. What changed:    when to take this  reasons to take this   insulin aspart protamine- aspart (70-30) 100 UNIT/ML injection Commonly known as:  NOVOLOG MIX 70/30 Inject 0.3 mLs (30 Units total) into the skin 2 (two) times daily with a meal.   lisinopril 40 MG tablet Commonly known as:  PRINIVIL,ZESTRIL Take 1 tablet (40 mg total) by mouth daily.   metoprolol tartrate 25 MG tablet Commonly known as:  LOPRESSOR Take 0.5 tablets (12.5 mg total) by mouth 2 (two) times daily.      Follow-up Information    [facility]. Schedule an appointment as soon as possible for a visit in 1 week(s).   Contact information: [place] [address] [phone]         Allergies  Allergen Reactions  . Ibuprofen Nausea And Vomiting and Other (See Comments)    Makes the patient feel bloated also  . Tylenol [Acetaminophen] Nausea And Vomiting and Other (See Comments)    Makes the patient feel bloated also    Consultations:  None   Procedures/Studies: No results found.    Discharge Exam: Vitals:   05/27/17 1000 05/27/17 1100  BP: (!) 165/69 (!) 147/85  Pulse: 62 (!) 58  Resp: 14 11  Temp:    SpO2: 100% 100%    General: Pt is alert, awake, not in acute distress Cardiovascular: RRR, S1/S2 +, no rubs, no gallops Respiratory: CTA bilaterally, no wheezing, no rhonchi Abdominal: Soft, NT, ND, bowel sounds + Extremities: no edema, no cyanosis    The results of significant diagnostics from this hospitalization (including imaging, microbiology, ancillary and laboratory) are listed below for reference.     Microbiology: Recent Results (from the past 240 hour(s))  MRSA PCR Screening     Status: None   Collection Time: 05/27/17 12:15 AM  Result Value Ref Range Status    MRSA by PCR NEGATIVE NEGATIVE Final    Comment:        The GeneXpert MRSA Assay (FDA approved for NASAL specimens only), is one component of a comprehensive MRSA colonization surveillance program. It is not intended to diagnose MRSA infection nor to guide or monitor treatment for MRSA infections. Performed at [facility], [place] [address], [place], [place] [address]      Labs: BNP (last 3 results) No results for input(s): BNP in the last 8760 hours. Basic Metabolic Panel: Recent Labs  Lab 05/23/17 2009  05/24/17 2105 05/25/17 1952 05/26/17 1853 05/26/17 2023 05/27/17 0324 05/27/17 1153  NA 131*   < > 123* 127* 128* 129* 136 135  K 3.5   < >  4.1 4.3 4.1 4.4 2.8* 3.1*  CL 95*   < > 85* 89* 93* 92* 101 99*  CO2 26  --  25  --   --  _0 GLUCOSE 538*   < > 949* >700* >700* 1,103* 167* 252*  BUN 12   < > _1 CREATININE 0.76   < > 0.97 0.80 0.60* 0.87 0.53* 0.49*  CALCIUM 9.1  --  9.1  --   --  9.1 8.8* 8.8*  MG  --   --   --   --   --   --  1.9  --    < > = values in this interval not displayed.   Liver Function Tests: Recent Labs  Lab 05/23/17 2009  AST 20  ALT 24  ALKPHOS 96  BILITOT 0.5  PROT 6.2*  ALBUMIN 3.1*   No results for input(s): LIPASE, AMYLASE in the last 168 hours. No results for input(s): AMMONIA in the last 168 hours. CBC: Recent Labs  Lab 05/23/17 0034 05/23/17 2009  05/24/17 2105 05/25/17 1952 05/26/17 1853 05/26/17 2023 05/27/17 0324  WBC 4.6 5.6  --  5.7  --   --  5.5 6.6  NEUTROABS 2.3 3.1  --  4.1  --   --  4.4  --   HGB 9.9* 9.4*   < > 11.0* 11.9* 13.3 10.9* 9.4*  HCT 30.6* 29.6*   < > 34.4* 35.0* 39.0 34.4* 28.8*  MCV 80.5 79.4  --  80.6  --   --  79.8 78.0  PLT 329 291  --  321  --   --  313 273   < > = values in this interval not displayed.   Cardiac Enzymes: No results for input(s): CKTOTAL, CKMB, CKMBINDEX, TROPONINI in the last 168 hours. BNP: Invalid input(s): POCBNP CBG: Recent  Labs  Lab 05/27/17 0322 05/27/17 0428 05/27/17 0534 05/27/17 0720 05/27/17 1137  GLUCAP 164* 122* 105* 121* 287*   D-Dimer No results for input(s): DDIMER in the last 72 hours. Hgb A1c No results for input(s): HGBA1C in the last 72 hours. Lipid Profile No results for input(s): CHOL, HDL, LDLCALC, TRIG, CHOLHDL, LDLDIRECT in the last 72 hours. Thyroid function studies No results for input(s): TSH, T4TOTAL, T3FREE, THYROIDAB in the last 72 hours.  Invalid input(s): FREET3 Anemia work up No results for input(s): VITAMINB12, FOLATE, FERRITIN, TIBC, IRON, RETICCTPCT in the last 72 hours. Urinalysis    Component Value Date/Time   COLORURINE STRAW (A) 05/23/2017 1958   APPEARANCEUR CLEAR 05/23/2017 1958   LABSPEC 1.027 05/23/2017 1958   PHURINE 6.0 05/23/2017 1958   GLUCOSEU >=500 (A) 05/23/2017 1958   HGBUR NEGATIVE 05/23/2017 [place] NEGATIVE 05/23/2017 1958   KETONESUR NEGATIVE 05/23/2017 1958   PROTEINUR NEGATIVE 05/23/2017 1958   UROBILINOGEN 1.0 02/07/2011 1605   NITRITE NEGATIVE 05/23/2017 1958   LEUKOCYTESUR NEGATIVE 05/23/2017 1958   Sepsis Labs Invalid input(s): PROCALCITONIN,  WBC,  LACTICIDVEN Microbiology Recent Results (from the past 240 hour(s))  MRSA PCR Screening     Status: None   Collection Time: 05/27/17 12:15 AM  Result Value Ref Range Status   MRSA by PCR NEGATIVE NEGATIVE Final    Comment:        The GeneXpert MRSA Assay (FDA approved for NASAL specimens only), is one component of a comprehensive MRSA colonization surveillance program. It is not intended to diagnose MRSA infection nor to guide or monitor treatment  for MRSA infections. Performed at [facility], [place] [address] [place], [place] [address]      Patient was seen and examined on the day of discharge and was found to be in stable condition. Time coordinating discharge: 35 minutes including assessment and coordination of care, as well as examination of  the patient.   SIGNED:  Dessa Phi, DO [facility] Pager [phone]  If 7PM-7AM, please contact night-coverage [email or web address] Password [email or web address] 05/27/2017, 1:05 PM

## 2017-05-27 NOTE — ED Notes (Signed)
Bed: [facility] Expected date:  Expected time:  Means of arrival:  Comments: Ems hyperglycemia

## 2017-05-28 ENCOUNTER — Other Ambulatory Visit: Payer: Self-pay

## 2017-05-28 ENCOUNTER — Emergency Department (HOSPITAL_COMMUNITY)
Admission: EM | Admit: 2017-05-28 | Discharge: 2017-05-28 | Disposition: A | Discharge status: Home / Self Care | Payer: Self-pay | Attending: Emergency Medicine | Admitting: Emergency Medicine

## 2017-05-28 ENCOUNTER — Encounter (HOSPITAL_COMMUNITY): Payer: Self-pay | Admitting: Emergency Medicine

## 2017-05-28 DIAGNOSIS — R739 Hyperglycemia, unspecified: Secondary | ICD-10-CM

## 2017-05-28 DIAGNOSIS — I10 Essential (primary) hypertension: Secondary | ICD-10-CM | POA: Insufficient documentation

## 2017-05-28 DIAGNOSIS — E1165 Type 2 diabetes mellitus with hyperglycemia: Secondary | ICD-10-CM | POA: Insufficient documentation

## 2017-05-28 DIAGNOSIS — F17210 Nicotine dependence, cigarettes, uncomplicated: Secondary | ICD-10-CM | POA: Insufficient documentation

## 2017-05-28 LAB — BASIC METABOLIC PANEL
ANION GAP: 10 (ref 5–15)
BUN: 19 mg/dL (ref 6–20)
CALCIUM: 8.8 mg/dL — AB (ref 8.9–10.3)
CO2: 23 mmol/L (ref 22–32)
Chloride: 97 mmol/L — ABNORMAL LOW (ref 101–111)
Creatinine, Ser: 0.89 mg/dL (ref 0.61–1.24)
Glucose, Bld: 699 mg/dL (ref 65–99)
Potassium: 4.7 mmol/L (ref 3.5–5.1)
SODIUM: 130 mmol/L — AB (ref 135–145)

## 2017-05-28 LAB — I-STAT CHEM 8, ED
BUN: 12 mg/dL (ref 6–20)
CALCIUM ION: 1.15 mmol/L (ref 1.15–1.40)
CREATININE: 0.7 mg/dL (ref 0.61–1.24)
Chloride: 94 mmol/L — ABNORMAL LOW (ref 101–111)
HEMATOCRIT: 37 % — AB (ref 39.0–52.0)
Hemoglobin: 12.6 g/dL — ABNORMAL LOW (ref 13.0–17.0)
Potassium: 4.6 mmol/L (ref 3.5–5.1)
Sodium: 130 mmol/L — ABNORMAL LOW (ref 135–145)
TCO2: 24 mmol/L (ref 22–32)

## 2017-05-28 LAB — URINALYSIS, ROUTINE W REFLEX MICROSCOPIC
Bacteria, UA: NONE SEEN
Bilirubin Urine: NEGATIVE
Glucose, UA: >=500 mg/dL — AB
Hgb urine dipstick: NEGATIVE
Ketones, ur: NEGATIVE mg/dL
Leukocytes, UA: NEGATIVE
Nitrite: NEGATIVE
PH: 6 (ref 5.0–8.0)
Protein, ur: NEGATIVE mg/dL
SPECIFIC GRAVITY, URINE: 1.023 (ref 1.005–1.030)

## 2017-05-28 LAB — CBG MONITORING, ED
Glucose-Capillary: 456 mg/dL — ABNORMAL HIGH (ref 65–99)
Glucose-Capillary: >600 mg/dL (ref 65–99)
Glucose-Capillary: 568 mg/dL (ref 65–99)

## 2017-05-28 MED ORDER — INSULIN ASPART 100 UNIT/ML ~~LOC~~ SOLN
10.0000 [IU] | Freq: Once | SUBCUTANEOUS | Status: AC
  Administered 2017-05-28: 10 [IU] via SUBCUTANEOUS
  Filled 2017-05-28: qty 1

## 2017-05-28 NOTE — ED Notes (Signed)
Notified EDP [place] of elevated glucose >700

## 2017-05-28 NOTE — ED Provider Notes (Signed)
[facility] DEPT Provider Note   CSN: [id number] Arrival date & time: 05/28/17  1713     History   Chief Complaint Chief Complaint  Patient presents with  . Hyperglycemia    HPI Mike [name] is a 57 y.o. male who presents with hyperglycemia. PMH significant for frequent ED visits, medical non-compliance, insulin dependent DM. He states he's here for high blood sugar. He requests a diet coke and soup. He denies LOC earlier today and states he was just sleeping. He denies any complaints.  LEVEL 5 caveat due to psych  HPI  Past Medical History:  Diagnosis Date  . Anxiety   . Anxiety   . Chronic lower back pain   . Depression   . DKA (diabetic ketoacidoses) ([place]) 07/30/2016  . Hyperlipemia   . Hypertension   . Migraine    "last one was ~ 4 yr ago" (12/23/2014)  . Seizures ([place])    "related to pills for anxiety; if I don't take the pills I'm suppose to take I'll have them" (12/23/2014)  . Type II diabetes mellitus [facility])     Patient Active Problem List   Diagnosis Date Noted  . Hyperglycemia 05/26/2017  . Noncompliance 05/26/2017  . AKI (acute kidney injury) ([place]) 03/04/2017  . Malnutrition of moderate degree 02/08/2017  . DKA (diabetic ketoacidoses) ([place]) 02/07/2017  . History of stroke 02/07/2017  . Hyperphosphatemia 12/13/2016  . Evaluation by psychiatric service required 11/30/2016  . Lactic acidosis 11/18/2016  . Normocytic anemia 11/06/2016  . HLD (hyperlipidemia) 11/06/2016  . Adjustment disorder with other symptoms 11/05/2016  . Uncontrolled type 2 diabetes mellitus with hyperosmolar nonketotic hyperglycemia ([place]) 08/24/2016  . Essential hypertension 08/24/2016  . Acute ischemic stroke ([place])   . Protein-calorie malnutrition, severe 12/24/2014  . Homelessness 12/23/2014  . Hypertensive urgency 12/23/2014  . DM (diabetes mellitus) ([place]) 12/23/2014  . Intermittent palpitations 12/23/2014  . Tobacco abuse 12/23/2014  .  Pleuritic chest pain 12/23/2014  . Abnormal EKG 12/23/2014  . Type II diabetes mellitus with renal manifestations [facility]     Past Surgical History:  Procedure Laterality Date  . NO PAST SURGERIES          Home Medications    Prior to Admission medications   Medication Sig Start Date End Date Taking? Authorizing Provider  amLODipine (NORVASC) 10 MG tablet Take 1 tablet (10 mg total) by mouth daily. 05/28/17  Yes Dessa Phi, DO  aspirin 325 MG tablet Take 1 tablet (325 mg total) by mouth daily. 03/08/17  Yes Georgette Shell, MD  atorvastatin (LIPITOR) 40 MG tablet Take 1 tablet (40 mg total) by mouth daily. 01/26/17  Yes Cardama, Grayce Sessions, MD  ferrous sulfate 325 (65 FE) MG tablet Take 1 tablet (325 mg total) daily by mouth. Patient taking differently: Take 325 mg by mouth daily as needed (low iron).  11/23/16  Yes Ward, Cyril Mourning N, DO  insulin aspart protamine- aspart (NOVOLOG MIX 70/30) (70-30) 100 UNIT/ML injection Inject 0.3 mLs (30 Units total) into the skin 2 (two) times daily with a meal. 04/30/17  Yes Dorie Rank, MD  lisinopril (PRINIVIL,ZESTRIL) 40 MG tablet Take 1 tablet (40 mg total) by mouth daily. 04/30/17  Yes Dorie Rank, MD  metoprolol tartrate (LOPRESSOR) 25 MG tablet Take 0.5 tablets (12.5 mg total) by mouth 2 (two) times daily. 03/07/17  Yes Georgette Shell, MD  blood glucose meter kit and supplies KIT Dispense based on patient and insurance preference. Use up to four times  daily as directed. (FOR ICD-9 250.00, 250.01). 11/22/16   Velvet Bathe, MD    Family History Family History  Problem Relation Age of Onset  . Diabetes Mellitus II Mother   . Diabetes Mellitus II Father     Social History Social History   Tobacco Use  . Smoking status: Current Every Day Smoker    Packs/day: 0.10    Years: 40.00    Pack years: 4.00    Types: Cigarettes  . Smokeless tobacco: Never Used  Substance Use Topics  . Alcohol use: Yes    Alcohol/week: 1.2 oz    Types:  2 Cans of beer per week  . Drug use: No    Comment: 12/23/2014 "stopped ~ 10 yrs ago"     Allergies   Ibuprofen and Tylenol [acetaminophen]   Review of Systems Review of Systems  Unable to perform ROS: Psychiatric disorder  Respiratory: Negative for shortness of breath.   Cardiovascular: Negative for chest pain.  Gastrointestinal: Negative for abdominal pain, nausea and vomiting.  Endocrine:       +hyperglycemia     Physical Exam Updated Vital Signs BP (!) 197/82 (BP Location: Right Arm)   Pulse 87   Temp 98.2 F (36.8 C) (Oral)   Resp 18   SpO2 99%   Physical Exam  Constitutional: He is oriented to person, place, and time. He appears well-developed and well-nourished. No distress.  Disheveled. NAD  HENT:  Head: Normocephalic and atraumatic.  Eyes: Pupils are equal, round, and reactive to light. Conjunctivae are normal. Right eye exhibits no discharge. Left eye exhibits no discharge. No scleral icterus.  Neck: Normal range of motion.  Cardiovascular: Normal rate and regular rhythm.  Pulmonary/Chest: Effort normal and breath sounds normal. No respiratory distress.  Abdominal: Soft. Bowel sounds are normal. He exhibits no distension. There is no tenderness.  Neurological: He is alert and oriented to person, place, and time.  Skin: Skin is warm and dry.  Psychiatric: He has a normal mood and affect. His behavior is normal.  Nursing note and vitals reviewed.    ED Treatments / Results  Labs (all labs ordered are listed, but only abnormal results are displayed) Labs Reviewed  URINALYSIS, ROUTINE W REFLEX MICROSCOPIC - Abnormal; Notable for the following components:      Result Value   Color, Urine COLORLESS (*)    Glucose, UA >=500 (*)    All other components within normal limits  I-STAT CHEM 8, ED - Abnormal; Notable for the following components:   Sodium 130 (*)    Chloride 94 (*)    Glucose, Bld >700 (*)    Hemoglobin 12.6 (*)    HCT 37.0 (*)    All other  components within normal limits  CBG MONITORING, ED - Abnormal; Notable for the following components:   Glucose-Capillary >600 (*)    All other components within normal limits  CBG MONITORING, ED - Abnormal; Notable for the following components:   Glucose-Capillary 568 (*)    All other components within normal limits    EKG None  Radiology No results found.  Procedures Procedures (including critical care time)  Medications Ordered in ED Medications  insulin aspart (novoLOG) injection 10 Units (10 Units Subcutaneous Given 05/28/17 2030)     Initial Impression / Assessment and Plan / ED Course  I have reviewed the triage vital signs and the nursing notes.  Pertinent labs & imaging results that were available during my care of the patient were reviewed by  me and considered in my medical decision making (see chart for details).  57 year old male presents with hyperglycemia. He is hypertensive but otherwise vitals are normal. Chem 8 is remarkable for hyponatremia (130), hyperglycemia, anemia. UA is negative for ketones. Anion gap is 12. Insulin was ordered.  After insulin, CBG is 568. Will d/c  Final Clinical Impressions(s) / ED Diagnoses   Final diagnoses:  Hyperglycemia    ED Discharge Orders    None       Recardo Evangelist, PA-C 05/28/17 2214    Julianne Rice, MD 05/31/17 [phone]

## 2017-05-28 NOTE — ED Notes (Signed)
Patient peed on wall in hallway.

## 2017-05-28 NOTE — ED Notes (Signed)
Bed: [facility]PT4 Expected date:  Expected time:  Means of arrival:  Comments: 

## 2017-05-28 NOTE — ED Notes (Signed)
[facility] informed me that the patient was eating sugar packets he had pulled out of his pocket. Patient is not currently doing so and was not given sugar packets by staff.

## 2017-05-28 NOTE — ED Triage Notes (Signed)
Per EMS bystander called related to "patient laying in the park." VIA EMS:190/100 BP 80 HR HIGH CBG

## 2017-05-28 NOTE — Discharge Instructions (Addendum)
Eating sugar will cause your blood sugar to be too high.  Avoid sweets and high carb meals. Follow up with your primary care.

## 2017-05-29 ENCOUNTER — Encounter (HOSPITAL_COMMUNITY): Payer: Self-pay | Admitting: Emergency Medicine

## 2017-05-29 ENCOUNTER — Emergency Department (HOSPITAL_COMMUNITY)
Admission: EM | Admit: 2017-05-29 | Discharge: 2017-05-29 | Disposition: A | Discharge status: Home / Self Care | Payer: Self-pay | Attending: Emergency Medicine | Admitting: Emergency Medicine

## 2017-05-29 DIAGNOSIS — E1165 Type 2 diabetes mellitus with hyperglycemia: Secondary | ICD-10-CM | POA: Insufficient documentation

## 2017-05-29 DIAGNOSIS — R739 Hyperglycemia, unspecified: Secondary | ICD-10-CM

## 2017-05-29 DIAGNOSIS — I10 Essential (primary) hypertension: Secondary | ICD-10-CM | POA: Insufficient documentation

## 2017-05-29 DIAGNOSIS — F17210 Nicotine dependence, cigarettes, uncomplicated: Secondary | ICD-10-CM | POA: Insufficient documentation

## 2017-05-29 LAB — CBG MONITORING, ED: GLUCOSE-CAPILLARY: 299 mg/dL — AB (ref 65–99)

## 2017-05-29 NOTE — ED Notes (Signed)
Bed: WLPT3 Expected date:  Expected time:  Means of arrival:  Comments: 

## 2017-05-29 NOTE — ED Triage Notes (Signed)
Per EMS, patient from [employer], CBG 497. Patient eating sugar packets upon EMS arrival.

## 2017-05-29 NOTE — ED Provider Notes (Signed)
[place] DEPT Provider Note   CSN: [id number] Arrival date & time: 05/29/17  2133     History   Chief Complaint Chief Complaint  Patient presents with  . Hyperglycemia    HPI Mike [name] is a 57 y.o. male.  57 year old male with history of diabetes and well-known to this department presents with hyperglycemia prior to arrival.  Patient is well known to be noncompliant with his insulin as well as with his diet.  Blood sugars are chronically elevated and he called EMS today because he felt it was high.  Per EMS CBG was 497.  On arrival here he is 6.  He has no other complaints.      Past Medical History:  Diagnosis Date  . Anxiety   . Anxiety   . Chronic lower back pain   . Depression   . DKA (diabetic ketoacidoses) ([place]) 07/30/2016  . Hyperlipemia   . Hypertension   . Migraine    "last one was ~ 4 yr ago" (12/23/2014)  . Seizures ([place])    "related to pills for anxiety; if I don't take the pills I'm suppose to take I'll have them" (12/23/2014)  . Type II diabetes mellitus [facility])     Patient Active Problem List   Diagnosis Date Noted  . Hyperglycemia 05/26/2017  . Noncompliance 05/26/2017  . AKI (acute kidney injury) ([place]) 03/04/2017  . Malnutrition of moderate degree 02/08/2017  . DKA (diabetic ketoacidoses) ([place]) 02/07/2017  . History of stroke 02/07/2017  . Hyperphosphatemia 12/13/2016  . Evaluation by psychiatric service required 11/30/2016  . Lactic acidosis 11/18/2016  . Normocytic anemia 11/06/2016  . HLD (hyperlipidemia) 11/06/2016  . Adjustment disorder with other symptoms 11/05/2016  . Uncontrolled type 2 diabetes mellitus with hyperosmolar nonketotic hyperglycemia ([place]) 08/24/2016  . Essential hypertension 08/24/2016  . Acute ischemic stroke ([place])   . Protein-calorie malnutrition, severe 12/24/2014  . Homelessness 12/23/2014  . Hypertensive urgency 12/23/2014  . DM (diabetes mellitus) ([place]) 12/23/2014  .  Intermittent palpitations 12/23/2014  . Tobacco abuse 12/23/2014  . Pleuritic chest pain 12/23/2014  . Abnormal EKG 12/23/2014  . Type II diabetes mellitus with renal manifestations [facility])     Past Surgical History:  Procedure Laterality Date  . NO PAST SURGERIES          Home Medications    Prior to Admission medications   Medication Sig Start Date End Date Taking? Authorizing Provider  amLODipine (NORVASC) 10 MG tablet Take 1 tablet (10 mg total) by mouth daily. 05/28/17  Yes Dessa Phi, DO  aspirin 325 MG tablet Take 1 tablet (325 mg total) by mouth daily. 03/08/17  Yes Georgette Shell, MD  atorvastatin (LIPITOR) 40 MG tablet Take 1 tablet (40 mg total) by mouth daily. 01/26/17  Yes Cardama, Grayce Sessions, MD  blood glucose meter kit and supplies KIT Dispense based on patient and insurance preference. Use up to four times daily as directed. (FOR ICD-9 250.00, 250.01). 11/22/16  Yes Velvet Bathe, MD  ferrous sulfate 325 (65 FE) MG tablet Take 1 tablet (325 mg total) daily by mouth. Patient taking differently: Take 325 mg by mouth daily as needed (low iron).  11/23/16  Yes Ward, Cyril Mourning N, DO  insulin aspart protamine- aspart (NOVOLOG MIX 70/30) (70-30) 100 UNIT/ML injection Inject 0.3 mLs (30 Units total) into the skin 2 (two) times daily with a meal. 04/30/17  Yes Dorie Rank, MD  lisinopril (PRINIVIL,ZESTRIL) 40 MG tablet Take 1 tablet (40 mg total) by  mouth daily. 04/30/17  Yes Dorie Rank, MD  metoprolol tartrate (LOPRESSOR) 25 MG tablet Take 0.5 tablets (12.5 mg total) by mouth 2 (two) times daily. 03/07/17  Yes Georgette Shell, MD    Family History Family History  Problem Relation Age of Onset  . Diabetes Mellitus II Mother   . Diabetes Mellitus II Father     Social History Social History   Tobacco Use  . Smoking status: Current Every Day Smoker    Packs/day: 0.10    Years: 40.00    Pack years: 4.00    Types: Cigarettes  . Smokeless tobacco: Never Used    Substance Use Topics  . Alcohol use: Yes    Alcohol/week: 1.2 oz    Types: 2 Cans of beer per week  . Drug use: No    Comment: 12/23/2014 "stopped ~ 10 yrs ago"     Allergies   Ibuprofen and Tylenol [acetaminophen]   Review of Systems Review of Systems  All other systems reviewed and are negative.    Physical Exam Updated Vital Signs BP (!) 178/89 (BP Location: Left Arm)   Pulse 79   Temp (!) 97.4 F (36.3 C) (Oral)   Resp 18   Ht 1.93 m (_0 )   Wt 77.1 kg (170 lb)   SpO2 100%   BMI 20.69 kg/m   Physical Exam  Constitutional: He is oriented to person, place, and time. He appears well-developed and well-nourished.  Non-toxic appearance. No distress.  HENT:  Head: Normocephalic and atraumatic.  Eyes: Pupils are equal, round, and reactive to light. Conjunctivae, EOM and lids are normal.  Neck: Normal range of motion. Neck supple. No tracheal deviation present. No thyroid mass present.  Cardiovascular: Normal rate, regular rhythm and normal heart sounds. Exam reveals no gallop.  No murmur heard. Pulmonary/Chest: Effort normal and breath sounds normal. No stridor. No respiratory distress. He has no decreased breath sounds. He has no wheezes. He has no rhonchi. He has no rales.  Abdominal: Soft. Normal appearance and bowel sounds are normal. He exhibits no distension. There is no tenderness. There is no rebound and no CVA tenderness.  Musculoskeletal: Normal range of motion. He exhibits no edema or tenderness.  Neurological: He is alert and oriented to person, place, and time. No cranial nerve deficit. GCS eye subscore is 4. GCS verbal subscore is 5. GCS motor subscore is 6.  Skin: Skin is warm and dry. No abrasion and no rash noted.  Psychiatric: His affect is blunt. His speech is delayed. He is withdrawn.  Nursing note and vitals reviewed.    ED Treatments / Results  Labs (all labs ordered are listed, but only abnormal results are displayed) Labs Reviewed  CBG  MONITORING, ED - Abnormal; Notable for the following components:      Result Value   Glucose-Capillary 299 (*)    All other components within normal limits    EKG None  Radiology No results found.  Procedures Procedures (including critical care time)  Medications Ordered in ED Medications - No data to display   Initial Impression / Assessment and Plan / ED Course  I have reviewed the triage vital signs and the nursing notes.  Pertinent labs & imaging results that were available during my care of the patient were reviewed by me and considered in my medical decision making (see chart for details).     Patient blood sugar here is 299.  No further interventions will be done at this time.  He is not tachycardic.  He is slightly hypertensive but that is his baseline.  Patient states will follow up at the [facility] tomorrow with Raechel Chute for his insulin.  Final Clinical Impressions(s) / ED Diagnoses   Final diagnoses:  None    ED Discharge Orders    None       Lacretia Leigh, MD 05/29/17 2211

## 2017-05-30 ENCOUNTER — Other Ambulatory Visit: Payer: Self-pay

## 2017-05-30 ENCOUNTER — Emergency Department (HOSPITAL_COMMUNITY)
Admission: EM | Admit: 2017-05-30 | Discharge: 2017-05-30 | Disposition: A | Discharge status: Home / Self Care | Payer: Self-pay | Attending: Emergency Medicine | Admitting: Emergency Medicine

## 2017-05-30 ENCOUNTER — Emergency Department (HOSPITAL_COMMUNITY)
Admission: EM | Admit: 2017-05-30 | Discharge: 2017-05-31 | Disposition: A | Discharge status: Home / Self Care | Payer: Self-pay | Attending: Emergency Medicine | Admitting: Emergency Medicine

## 2017-05-30 ENCOUNTER — Encounter (HOSPITAL_COMMUNITY): Payer: Self-pay | Admitting: Emergency Medicine

## 2017-05-30 ENCOUNTER — Encounter (HOSPITAL_COMMUNITY): Payer: Self-pay

## 2017-05-30 DIAGNOSIS — Z794 Long term (current) use of insulin: Secondary | ICD-10-CM | POA: Insufficient documentation

## 2017-05-30 DIAGNOSIS — Z79899 Other long term (current) drug therapy: Secondary | ICD-10-CM | POA: Insufficient documentation

## 2017-05-30 DIAGNOSIS — F17210 Nicotine dependence, cigarettes, uncomplicated: Secondary | ICD-10-CM | POA: Insufficient documentation

## 2017-05-30 DIAGNOSIS — I10 Essential (primary) hypertension: Secondary | ICD-10-CM | POA: Insufficient documentation

## 2017-05-30 DIAGNOSIS — R739 Hyperglycemia, unspecified: Secondary | ICD-10-CM

## 2017-05-30 DIAGNOSIS — E1165 Type 2 diabetes mellitus with hyperglycemia: Secondary | ICD-10-CM | POA: Insufficient documentation

## 2017-05-30 DIAGNOSIS — Z7982 Long term (current) use of aspirin: Secondary | ICD-10-CM | POA: Insufficient documentation

## 2017-05-30 DIAGNOSIS — E119 Type 2 diabetes mellitus without complications: Secondary | ICD-10-CM

## 2017-05-30 LAB — CBG MONITORING, ED
GLUCOSE-CAPILLARY: 180 mg/dL — AB (ref 65–99)
Glucose-Capillary: 98 mg/dL (ref 65–99)
Glucose-Capillary: >600 mg/dL (ref 65–99)

## 2017-05-30 LAB — BASIC METABOLIC PANEL
Anion gap: 10 (ref 5–15)
BUN: 15 mg/dL (ref 6–20)
CO2: 28 mmol/L (ref 22–32)
CREATININE: 0.77 mg/dL (ref 0.61–1.24)
Calcium: 9.4 mg/dL (ref 8.9–10.3)
Chloride: 98 mmol/L — ABNORMAL LOW (ref 101–111)
GFR calc Af Amer: >60 mL/min (ref >60)
GLUCOSE: 413 mg/dL — AB (ref 65–99)
Potassium: 3.4 mmol/L — ABNORMAL LOW (ref 3.5–5.1)
Sodium: 136 mmol/L (ref 135–145)

## 2017-05-30 LAB — CBC
HEMATOCRIT: 28.2 % — AB (ref 39.0–52.0)
Hemoglobin: 8.9 g/dL — ABNORMAL LOW (ref 13.0–17.0)
MCH: 25.5 pg — ABNORMAL LOW (ref 26.0–34.0)
MCHC: 31.6 g/dL (ref 30.0–36.0)
MCV: 80.8 fL (ref 78.0–100.0)
PLATELETS: 298 10*3/uL (ref 150–400)
RBC: 3.49 MIL/uL — ABNORMAL LOW (ref 4.22–5.81)
RDW: 13.7 % (ref 11.5–15.5)
WBC: 6.8 10*3/uL (ref 4.0–10.5)

## 2017-05-30 MED ORDER — INSULIN ASPART 100 UNIT/ML ~~LOC~~ SOLN
10.0000 [IU] | Freq: Once | SUBCUTANEOUS | Status: AC
  Administered 2017-05-30: 10 [IU] via SUBCUTANEOUS
  Filled 2017-05-30: qty 1

## 2017-05-30 NOTE — Discharge Instructions (Signed)
Your blood sugar is normal. Follow-up with your primary care doctor.  DONT EAT SUGAR.

## 2017-05-30 NOTE — ED Notes (Signed)
Bed: WTR8 Expected date:  Expected time:  Means of arrival:  Comments: 

## 2017-05-30 NOTE — ED Triage Notes (Signed)
Pt was walking in the middle of the street and an officer that was on his way home stopped and brought him here, pt's CBG is 98 in triage

## 2017-05-30 NOTE — ED Triage Notes (Signed)
Patient complaining of his sugar high. Patient states he is peeing a lot and is thirsty.

## 2017-05-30 NOTE — ED Provider Notes (Signed)
[place] DEPT Provider Note   CSN: [id number] Arrival date & time: 05/30/17  0315     History   Chief Complaint Chief Complaint  Patient presents with  . Hyperglycemia    HPI Mike [name] is a 57 y.o. male.  The history is provided by the patient and medical records.  Hyperglycemia     57 y.o. M with hx of anxiety, depression, DM, HTN, migraines, seizures, presenting to the ED for hyperglycemia. Patient was reportedly found walking down the road when off duty [employer] saw him and brought him here. Patient was just seen a few hours ago for hyperglycemia.  CBG 98.  Past Medical History:  Diagnosis Date  . Anxiety   . Anxiety   . Chronic lower back pain   . Depression   . DKA (diabetic ketoacidoses) ([place]) 07/30/2016  . Hyperlipemia   . Hypertension   . Migraine    "last one was ~ 4 yr ago" (12/23/2014)  . Seizures ([place])    "related to pills for anxiety; if I don't take the pills I'm suppose to take I'll have them" (12/23/2014)  . Type II diabetes mellitus [facility])     Patient Active Problem List   Diagnosis Date Noted  . Hyperglycemia 05/26/2017  . Noncompliance 05/26/2017  . AKI (acute kidney injury) ([place]) 03/04/2017  . Malnutrition of moderate degree 02/08/2017  . DKA (diabetic ketoacidoses) ([place]) 02/07/2017  . History of stroke 02/07/2017  . Hyperphosphatemia 12/13/2016  . Evaluation by psychiatric service required 11/30/2016  . Lactic acidosis 11/18/2016  . Normocytic anemia 11/06/2016  . HLD (hyperlipidemia) 11/06/2016  . Adjustment disorder with other symptoms 11/05/2016  . Uncontrolled type 2 diabetes mellitus with hyperosmolar nonketotic hyperglycemia ([place]) 08/24/2016  . Essential hypertension 08/24/2016  . Acute ischemic stroke ([place])   . Protein-calorie malnutrition, severe 12/24/2014  . Homelessness 12/23/2014  . Hypertensive urgency 12/23/2014  . DM (diabetes mellitus) ([place]) 12/23/2014  . Intermittent palpitations  12/23/2014  . Tobacco abuse 12/23/2014  . Pleuritic chest pain 12/23/2014  . Abnormal EKG 12/23/2014  . Type II diabetes mellitus with renal manifestations [facility])     Past Surgical History:  Procedure Laterality Date  . NO PAST SURGERIES          Home Medications    Prior to Admission medications   Medication Sig Start Date End Date Taking? Authorizing Provider  amLODipine (NORVASC) 10 MG tablet Take 1 tablet (10 mg total) by mouth daily. 05/28/17   Dessa Phi, DO  aspirin 325 MG tablet Take 1 tablet (325 mg total) by mouth daily. 03/08/17   Georgette Shell, MD  atorvastatin (LIPITOR) 40 MG tablet Take 1 tablet (40 mg total) by mouth daily. 01/26/17   Cardama, Grayce Sessions, MD  blood glucose meter kit and supplies KIT Dispense based on patient and insurance preference. Use up to four times daily as directed. (FOR ICD-9 250.00, 250.01). 11/22/16   Velvet Bathe, MD  ferrous sulfate 325 (65 FE) MG tablet Take 1 tablet (325 mg total) daily by mouth. Patient taking differently: Take 325 mg by mouth daily as needed (low iron).  11/23/16   Ward, Delice Bison, DO  insulin aspart protamine- aspart (NOVOLOG MIX 70/30) (70-30) 100 UNIT/ML injection Inject 0.3 mLs (30 Units total) into the skin 2 (two) times daily with a meal. 04/30/17   Dorie Rank, MD  lisinopril (PRINIVIL,ZESTRIL) 40 MG tablet Take 1 tablet (40 mg total) by mouth daily. 04/30/17   Dorie Rank, MD  metoprolol tartrate (LOPRESSOR) 25 MG tablet Take 0.5 tablets (12.5 mg total) by mouth 2 (two) times daily. 03/07/17   Georgette Shell, MD    Family History Family History  Problem Relation Age of Onset  . Diabetes Mellitus II Mother   . Diabetes Mellitus II Father     Social History Social History   Tobacco Use  . Smoking status: Current Every Day Smoker    Packs/day: 0.10    Years: 40.00    Pack years: 4.00    Types: Cigarettes  . Smokeless tobacco: Never Used  Substance Use Topics  . Alcohol use: Yes     Alcohol/week: 1.2 oz    Types: 2 Cans of beer per week  . Drug use: No    Comment: 12/23/2014 "stopped ~ 10 yrs ago"     Allergies   Ibuprofen and Tylenol [acetaminophen]   Review of Systems Review of Systems  Endocrine:       Hyperglycemia  All other systems reviewed and are negative.    Physical Exam   Physical Exam  Constitutional: He is oriented to person, place, and time. He appears well-developed and well-nourished.  Disheveled appearing  HENT:  Head: Normocephalic and atraumatic.  Mouth/Throat: Oropharynx is clear and moist.  Eyes: Pupils are equal, round, and reactive to light. Conjunctivae and EOM are normal.  Neck: Normal range of motion.  Cardiovascular: Normal rate, regular rhythm and normal heart sounds.  Pulmonary/Chest: Effort normal and breath sounds normal.  Abdominal: Soft. Bowel sounds are normal.  Musculoskeletal: Normal range of motion.  Neurological: He is alert and oriented to person, place, and time.  Skin: Skin is warm and dry.  Psychiatric: He has a normal mood and affect.  Nursing note and vitals reviewed.    ED Treatments / Results  Labs (all labs ordered are listed, but only abnormal results are displayed) Labs Reviewed  CBG MONITORING, ED    EKG None  Radiology No results found.  Procedures Procedures (including critical care time)  Medications Ordered in ED Medications - No data to display   Initial Impression / Assessment and Plan / ED Course  I have reviewed the triage vital signs and the nursing notes.  Pertinent labs & imaging results that were available during my care of the patient were reviewed by me and considered in my medical decision making (see chart for details).  57 y.o. M here after off duty [employer] found him walking down the road.  Was just seen in this facility a few hours ago for hyperglycemia.  No additional complaints at this time.  CBG here is 98.  I have very low suspicion for acute DKA.  His glucose is  controlled at this time.  Do not feel he needs further intervention or testing at this time.  He will be discharged home.  Follow-up with PCP.  Discussed plan with patient, he acknowledged understanding and agreed with plan of care.  Return precautions given for new or worsening symptoms.  Final Clinical Impressions(s) / ED Diagnoses   Final diagnoses:  Type 2 diabetes mellitus without complication, unspecified whether long term insulin use [facility])    ED Discharge Orders    None       Larene Pickett, PA-C 05/30/17 5784    Veryl Speak, MD 05/30/17 [phone]

## 2017-05-31 ENCOUNTER — Emergency Department (HOSPITAL_COMMUNITY)
Admission: EM | Admit: 2017-05-31 | Discharge: 2017-05-31 | Disposition: A | Discharge status: Home / Self Care | Payer: Self-pay | Attending: Emergency Medicine | Admitting: Emergency Medicine

## 2017-05-31 ENCOUNTER — Other Ambulatory Visit: Payer: Self-pay

## 2017-05-31 ENCOUNTER — Encounter (HOSPITAL_COMMUNITY): Payer: Self-pay | Admitting: Nurse Practitioner

## 2017-05-31 DIAGNOSIS — I10 Essential (primary) hypertension: Secondary | ICD-10-CM | POA: Insufficient documentation

## 2017-05-31 DIAGNOSIS — R739 Hyperglycemia, unspecified: Secondary | ICD-10-CM | POA: Insufficient documentation

## 2017-05-31 DIAGNOSIS — F17210 Nicotine dependence, cigarettes, uncomplicated: Secondary | ICD-10-CM | POA: Insufficient documentation

## 2017-05-31 DIAGNOSIS — E119 Type 2 diabetes mellitus without complications: Secondary | ICD-10-CM | POA: Insufficient documentation

## 2017-05-31 DIAGNOSIS — Z79899 Other long term (current) drug therapy: Secondary | ICD-10-CM | POA: Insufficient documentation

## 2017-05-31 DIAGNOSIS — Z7982 Long term (current) use of aspirin: Secondary | ICD-10-CM | POA: Insufficient documentation

## 2017-05-31 LAB — I-STAT CHEM 8, ED
BUN: 26 mg/dL — ABNORMAL HIGH (ref 6–20)
CHLORIDE: 95 mmol/L — AB (ref 101–111)
Calcium, Ion: 1.18 mmol/L (ref 1.15–1.40)
Creatinine, Ser: 1 mg/dL (ref 0.61–1.24)
Glucose, Bld: >700 mg/dL (ref 65–99)
HCT: 33 % — ABNORMAL LOW (ref 39.0–52.0)
HEMOGLOBIN: 11.2 g/dL — AB (ref 13.0–17.0)
POTASSIUM: 4.4 mmol/L (ref 3.5–5.1)
Sodium: 133 mmol/L — ABNORMAL LOW (ref 135–145)
TCO2: 30 mmol/L (ref 22–32)

## 2017-05-31 LAB — CBG MONITORING, ED: GLUCOSE-CAPILLARY: 344 mg/dL — AB (ref 65–99)

## 2017-05-31 MED ORDER — INSULIN ASPART 100 UNIT/ML ~~LOC~~ SOLN
10.0000 [IU] | Freq: Once | SUBCUTANEOUS | Status: AC
  Administered 2017-05-31: 10 [IU] via SUBCUTANEOUS
  Filled 2017-05-31: qty 1

## 2017-05-31 NOTE — ED Notes (Signed)
Pt seen going back to vending area with money in his hands

## 2017-05-31 NOTE — ED Notes (Signed)
CBG-344 

## 2017-05-31 NOTE — ED Provider Notes (Signed)
[place] DEPT Provider Note   CSN: [id number] Arrival date & time: 05/30/17  2110     History   Chief Complaint Chief Complaint  Patient presents with  . Hyperglycemia    HPI Mike [name] is a 57 y.o. male.  Patient presents to the emergency department with chief complaint of hyperglycemia.  He also reports polydipsia and polyuria.  This is not a new complaint for the patient.  He denies any other associated symptoms.  There are no aggravating factors.  He is noncompliant with his medications.  The history is provided by the patient. No language interpreter was used.    Past Medical History:  Diagnosis Date  . Anxiety   . Anxiety   . Chronic lower back pain   . Depression   . DKA (diabetic ketoacidoses) ([place]) 07/30/2016  . Hyperlipemia   . Hypertension   . Migraine    "last one was ~ 4 yr ago" (12/23/2014)  . Seizures ([place])    "related to pills for anxiety; if I don't take the pills I'm suppose to take I'll have them" (12/23/2014)  . Type II diabetes mellitus [facility])     Patient Active Problem List   Diagnosis Date Noted  . Hyperglycemia 05/26/2017  . Noncompliance 05/26/2017  . AKI (acute kidney injury) ([place]) 03/04/2017  . Malnutrition of moderate degree 02/08/2017  . DKA (diabetic ketoacidoses) ([place]) 02/07/2017  . History of stroke 02/07/2017  . Hyperphosphatemia 12/13/2016  . Evaluation by psychiatric service required 11/30/2016  . Lactic acidosis 11/18/2016  . Normocytic anemia 11/06/2016  . HLD (hyperlipidemia) 11/06/2016  . Adjustment disorder with other symptoms 11/05/2016  . Uncontrolled type 2 diabetes mellitus with hyperosmolar nonketotic hyperglycemia ([place]) 08/24/2016  . Essential hypertension 08/24/2016  . Acute ischemic stroke ([place])   . Protein-calorie malnutrition, severe 12/24/2014  . Homelessness 12/23/2014  . Hypertensive urgency 12/23/2014  . DM (diabetes mellitus) ([place]) 12/23/2014  . Intermittent  palpitations 12/23/2014  . Tobacco abuse 12/23/2014  . Pleuritic chest pain 12/23/2014  . Abnormal EKG 12/23/2014  . Type II diabetes mellitus with renal manifestations [facility])     Past Surgical History:  Procedure Laterality Date  . NO PAST SURGERIES          Home Medications    Prior to Admission medications   Medication Sig Start Date End Date Taking? Authorizing Provider  amLODipine (NORVASC) 10 MG tablet Take 1 tablet (10 mg total) by mouth daily. 05/28/17  Yes Dessa Phi, DO  aspirin 325 MG tablet Take 1 tablet (325 mg total) by mouth daily. 03/08/17  Yes Georgette Shell, MD  atorvastatin (LIPITOR) 40 MG tablet Take 1 tablet (40 mg total) by mouth daily. 01/26/17  Yes Cardama, Grayce Sessions, MD  ferrous sulfate 325 (65 FE) MG tablet Take 1 tablet (325 mg total) daily by mouth. Patient taking differently: Take 325 mg by mouth daily as needed (low iron).  11/23/16  Yes Ward, Cyril Mourning N, DO  insulin aspart protamine- aspart (NOVOLOG MIX 70/30) (70-30) 100 UNIT/ML injection Inject 0.3 mLs (30 Units total) into the skin 2 (two) times daily with a meal. 04/30/17  Yes Dorie Rank, MD  lisinopril (PRINIVIL,ZESTRIL) 40 MG tablet Take 1 tablet (40 mg total) by mouth daily. 04/30/17  Yes Dorie Rank, MD  metoprolol tartrate (LOPRESSOR) 25 MG tablet Take 0.5 tablets (12.5 mg total) by mouth 2 (two) times daily. 03/07/17  Yes Georgette Shell, MD  blood glucose meter kit and supplies KIT Dispense based  on patient and insurance preference. Use up to four times daily as directed. (FOR ICD-9 250.00, 250.01). 11/22/16   Velvet Bathe, MD    Family History Family History  Problem Relation Age of Onset  . Diabetes Mellitus II Mother   . Diabetes Mellitus II Father     Social History Social History   Tobacco Use  . Smoking status: Current Every Day Smoker    Packs/day: 0.10    Years: 40.00    Pack years: 4.00    Types: Cigarettes  . Smokeless tobacco: Never Used  Substance Use Topics   . Alcohol use: Yes    Alcohol/week: 1.2 oz    Types: 2 Cans of beer per week  . Drug use: No    Comment: 12/23/2014 "stopped ~ 10 yrs ago"     Allergies   Ibuprofen and Tylenol [acetaminophen]   Review of Systems Review of Systems  All other systems reviewed and are negative.    Physical Exam Updated Vital Signs BP (!) 147/102 (BP Location: Left Arm)   Pulse 89   Temp (!) 97.3 F (36.3 C) (Oral)   Resp 16   Ht _0  (1.93 m)   Wt 77.1 kg (170 lb)   SpO2 100%   BMI 20.69 kg/m   Physical Exam  Constitutional: He is oriented to person, place, and time. He appears well-developed and well-nourished.  HENT:  Head: Normocephalic and atraumatic.  Eyes: Pupils are equal, round, and reactive to light. Conjunctivae and EOM are normal. Right eye exhibits no discharge. Left eye exhibits no discharge. No scleral icterus.  Neck: Normal range of motion. Neck supple. No JVD present.  Cardiovascular: Normal rate, regular rhythm and normal heart sounds. Exam reveals no gallop and no friction rub.  No murmur heard. Pulmonary/Chest: Effort normal and breath sounds normal. No respiratory distress. He has no wheezes. He has no rales. He exhibits no tenderness.  Abdominal: Soft. He exhibits no distension and no mass. There is no tenderness. There is no rebound and no guarding.  Musculoskeletal: Normal range of motion. He exhibits no edema or tenderness.  Neurological: He is alert and oriented to person, place, and time.  Skin: Skin is warm and dry.  Psychiatric: He has a normal mood and affect. His behavior is normal. Judgment and thought content normal.  Nursing note and vitals reviewed.    ED Treatments / Results  Labs (all labs ordered are listed, but only abnormal results are displayed) Labs Reviewed  BASIC METABOLIC PANEL - Abnormal; Notable for the following components:      Result Value   Potassium 3.4 (*)    Chloride 98 (*)    Glucose, Bld 413 (*)    All other components  within normal limits  CBC - Abnormal; Notable for the following components:   RBC 3.49 (*)    Hemoglobin 8.9 (*)    HCT 28.2 (*)    MCH 25.5 (*)    All other components within normal limits  CBG MONITORING, ED - Abnormal; Notable for the following components:   Glucose-Capillary >600 (*)    All other components within normal limits  CBG MONITORING, ED - Abnormal; Notable for the following components:   Glucose-Capillary 180 (*)    All other components within normal limits  CBG MONITORING, ED    EKG None  Radiology No results found.  Procedures Procedures (including critical care time)  Medications Ordered in ED Medications  insulin aspart (novoLOG) injection 10 Units (10 Units Subcutaneous  Given 05/30/17 2257)     Initial Impression / Assessment and Plan / ED Course  I have reviewed the triage vital signs and the nursing notes.  Pertinent labs & imaging results that were available during my care of the patient were reviewed by me and considered in my medical decision making (see chart for details).     Patient with hyperglycemia.  He is well-known to the this emergency department.  Initial glucose is 413.  Will give 10 units subcutaneous insulin.  Repeat CBG is greater than 600, however the RN is concerned that the results are confounded due to the patient having unclean hands.  After washing his hands, repeat CBG was 180.  He is alert and oriented.  He ambulates out of the emergency department.  Final Clinical Impressions(s) / ED Diagnoses   Final diagnoses:  Hyperglycemia    ED Discharge Orders    None       Montine Circle, PA-C 05/31/17 0021    Ward, Delice Bison, DO 05/31/17 0021

## 2017-05-31 NOTE — Discharge Instructions (Signed)
Please avoid sugary foods. Take your insulin and medications as prescribed. Please call your primary care doctor tomorrow for follow up. If you develop worsening or new concerning symptoms you can return to the emergency department for re-evaluation.

## 2017-05-31 NOTE — ED Provider Notes (Signed)
[facility] DEPT Provider Note   CSN: [id number] Arrival date & time: 05/31/17  1328     History   Chief Complaint No chief complaint on file.   HPI Mike [name] is a 57 y.o. male who is well-known to the emergency department was 70 visits in the last 6 months and a history of diabetes who presents emergency department today for hyperglycemia with associated polydipsia and polyuria.  Patient states he is noncompliant with his home medications and has not followed up with his PCP. He reports nothing makes his symptoms better or worse. He denies any other associated symptoms.    HPI  Past Medical History:  Diagnosis Date  . Anxiety   . Anxiety   . Chronic lower back pain   . Depression   . DKA (diabetic ketoacidoses) ([place]) 07/30/2016  . Hyperlipemia   . Hypertension   . Migraine    "last one was ~ 4 yr ago" (12/23/2014)  . Seizures ([place])    "related to pills for anxiety; if I don't take the pills I'm suppose to take I'll have them" (12/23/2014)  . Type II diabetes mellitus [facility])     Patient Active Problem List   Diagnosis Date Noted  . Hyperglycemia 05/26/2017  . Noncompliance 05/26/2017  . AKI (acute kidney injury) ([place]) 03/04/2017  . Malnutrition of moderate degree 02/08/2017  . DKA (diabetic ketoacidoses) ([place]) 02/07/2017  . History of stroke 02/07/2017  . Hyperphosphatemia 12/13/2016  . Evaluation by psychiatric service required 11/30/2016  . Lactic acidosis 11/18/2016  . Normocytic anemia 11/06/2016  . HLD (hyperlipidemia) 11/06/2016  . Adjustment disorder with other symptoms 11/05/2016  . Uncontrolled type 2 diabetes mellitus with hyperosmolar nonketotic hyperglycemia ([place]) 08/24/2016  . Essential hypertension 08/24/2016  . Acute ischemic stroke ([place])   . Protein-calorie malnutrition, severe 12/24/2014  . Homelessness 12/23/2014  . Hypertensive urgency 12/23/2014  . DM (diabetes mellitus) ([place]) 12/23/2014  . Intermittent  palpitations 12/23/2014  . Tobacco abuse 12/23/2014  . Pleuritic chest pain 12/23/2014  . Abnormal EKG 12/23/2014  . Type II diabetes mellitus with renal manifestations [facility])     Past Surgical History:  Procedure Laterality Date  . NO PAST SURGERIES          Home Medications    Prior to Admission medications   Medication Sig Start Date End Date Taking? Authorizing Provider  amLODipine (NORVASC) 10 MG tablet Take 1 tablet (10 mg total) by mouth daily. 05/28/17   Dessa Phi, DO  aspirin 325 MG tablet Take 1 tablet (325 mg total) by mouth daily. 03/08/17   Georgette Shell, MD  atorvastatin (LIPITOR) 40 MG tablet Take 1 tablet (40 mg total) by mouth daily. 01/26/17   Cardama, Grayce Sessions, MD  blood glucose meter kit and supplies KIT Dispense based on patient and insurance preference. Use up to four times daily as directed. (FOR ICD-9 250.00, 250.01). 11/22/16   Velvet Bathe, MD  ferrous sulfate 325 (65 FE) MG tablet Take 1 tablet (325 mg total) daily by mouth. Patient taking differently: Take 325 mg by mouth daily as needed (low iron).  11/23/16   Ward, Delice Bison, DO  insulin aspart protamine- aspart (NOVOLOG MIX 70/30) (70-30) 100 UNIT/ML injection Inject 0.3 mLs (30 Units total) into the skin 2 (two) times daily with a meal. 04/30/17   Dorie Rank, MD  lisinopril (PRINIVIL,ZESTRIL) 40 MG tablet Take 1 tablet (40 mg total) by mouth daily. 04/30/17   Dorie Rank, MD  metoprolol tartrate (  LOPRESSOR) 25 MG tablet Take 0.5 tablets (12.5 mg total) by mouth 2 (two) times daily. 03/07/17   Georgette Shell, MD    Family History Family History  Problem Relation Age of Onset  . Diabetes Mellitus II Mother   . Diabetes Mellitus II Father     Social History Social History   Tobacco Use  . Smoking status: Current Every Day Smoker    Packs/day: 0.10    Years: 40.00    Pack years: 4.00    Types: Cigarettes  . Smokeless tobacco: Never Used  Substance Use Topics  . Alcohol use: Yes     Alcohol/week: 1.2 oz    Types: 2 Cans of beer per week  . Drug use: No    Comment: 12/23/2014 "stopped ~ 10 yrs ago"     Allergies   Ibuprofen and Tylenol [acetaminophen]   Review of Systems Review of Systems  All other systems reviewed and are negative.    Physical Exam Updated Vital Signs BP (!) 156/60   Pulse 100   Temp 97.8 F (36.6 C) (Oral)   Resp 18   Ht '6\' 4"'$  (1.93 m)   Wt 77.1 kg (170 lb)   SpO2 100%   BMI 20.69 kg/m   Physical Exam  Constitutional: He appears well-developed and well-nourished.  Disheveled in appearance  HENT:  Head: Normocephalic and atraumatic.  Right Ear: External ear normal.  Left Ear: External ear normal.  Nose: Nose normal.  Mouth/Throat: Uvula is midline, oropharynx is clear and moist and mucous membranes are normal. No tonsillar exudate.  Eyes: Pupils are equal, round, and reactive to light. Right eye exhibits no discharge. Left eye exhibits no discharge. No scleral icterus.  Neck: Trachea normal. Neck supple. No spinous process tenderness present. No neck rigidity. Normal range of motion present.  Cardiovascular: Normal rate, regular rhythm and intact distal pulses.  No murmur heard. Pulses:      Radial pulses are 2+ on the right side, and 2+ on the left side.       Dorsalis pedis pulses are 2+ on the right side, and 2+ on the left side.       Posterior tibial pulses are 2+ on the right side, and 2+ on the left side.  Pulmonary/Chest: Effort normal and breath sounds normal. He exhibits no tenderness.  Abdominal: Soft. Bowel sounds are normal. There is no tenderness. There is no rebound and no guarding.  Musculoskeletal: He exhibits no edema.  Lymphadenopathy:    He has no cervical adenopathy.  Neurological: He is alert.  Skin: Skin is warm and dry. No rash noted. He is not diaphoretic.  Psychiatric: He has a normal mood and affect.  Nursing note and vitals reviewed.    ED Treatments / Results  Labs (all labs ordered  are listed, but only abnormal results are displayed) Labs Reviewed  CBG MONITORING, ED - Abnormal; Notable for the following components:      Result Value   Glucose-Capillary >600 (*)    All other components within normal limits  I-STAT CHEM 8, ED - Abnormal; Notable for the following components:   Sodium 133 (*)    Chloride 95 (*)    BUN 26 (*)    Glucose, Bld >700 (*)    Hemoglobin 11.2 (*)    HCT 33.0 (*)    All other components within normal limits  CBG MONITORING, ED - Abnormal; Notable for the following components:   Glucose-Capillary >600 (*)    All  other components within normal limits  CBG MONITORING, ED - Abnormal; Notable for the following components:   Glucose-Capillary 344 (*)    All other components within normal limits    EKG None  Radiology No results found.  Procedures Procedures (including critical care time)  Medications Ordered in ED Medications - No data to display   Initial Impression / Assessment and Plan / ED Course  I have reviewed the triage vital signs and the nursing notes.  Pertinent labs & imaging results that were available during my care of the patient were reviewed by me and considered in my medical decision making (see chart for details).     57 y.o. male who is well known to the department with 68 visits in the last 6 months who presents with hyperglycemia. Vitals reviewed. I-STAT Chem-8 ordered as CBG is greater than 400.  10 units of insulin given.   I-Stat chem 8 reviewed. Calculated anion gap 8.0.  No evidence of DKA. Cr wnl. Potassium wnl. Hgb improved from previous. CBG shows downtrending blood sugar.  Will discharge home. I advised the patient to follow-up with PCP. He is to call his PCP tomorrow for follow up. Return precautions advised. Patient to be discharged.   Final Clinical Impressions(s) / ED Diagnoses   Final diagnoses:  Hyperglycemia    ED Discharge Orders    None       Lorelle Gibbs 05/31/17  1843    Fatima Blank, MD 06/01/17 2240

## 2017-05-31 NOTE — ED Notes (Signed)
Pt changing out of soiled scrubs into clean scrubs

## 2017-05-31 NOTE — ED Notes (Signed)
Pt at vending machine instructed pt not to eat or drink. Pt seen putting money into vending machine

## 2017-05-31 NOTE — ED Notes (Signed)
Pt resting, easily aroused. Continues to drink water. Voiding frequently, mod amount malodorous urine. Pt incontinent of large amount of urine.

## 2017-05-31 NOTE — ED Triage Notes (Signed)
Patient brought in by EMS for high blood sugar.

## 2017-05-31 NOTE — Discharge Instructions (Addendum)
You need to call your doctor in the morning.  Return for worsening symptoms.

## 2017-06-01 ENCOUNTER — Emergency Department (HOSPITAL_COMMUNITY)
Admission: EM | Admit: 2017-06-01 | Discharge: 2017-06-01 | Disposition: A | Discharge status: Home / Self Care | Payer: Self-pay | Attending: Emergency Medicine | Admitting: Emergency Medicine

## 2017-06-01 ENCOUNTER — Other Ambulatory Visit: Payer: Self-pay

## 2017-06-01 ENCOUNTER — Encounter (HOSPITAL_COMMUNITY): Payer: Self-pay

## 2017-06-01 DIAGNOSIS — Z794 Long term (current) use of insulin: Secondary | ICD-10-CM | POA: Insufficient documentation

## 2017-06-01 DIAGNOSIS — R739 Hyperglycemia, unspecified: Secondary | ICD-10-CM

## 2017-06-01 DIAGNOSIS — F17210 Nicotine dependence, cigarettes, uncomplicated: Secondary | ICD-10-CM | POA: Insufficient documentation

## 2017-06-01 DIAGNOSIS — I10 Essential (primary) hypertension: Secondary | ICD-10-CM | POA: Insufficient documentation

## 2017-06-01 DIAGNOSIS — E1165 Type 2 diabetes mellitus with hyperglycemia: Secondary | ICD-10-CM | POA: Insufficient documentation

## 2017-06-01 LAB — I-STAT CHEM 8, ED
BUN: 20 mg/dL (ref 6–20)
CALCIUM ION: 1.17 mmol/L (ref 1.15–1.40)
CHLORIDE: 95 mmol/L — AB (ref 101–111)
Creatinine, Ser: 0.8 mg/dL (ref 0.61–1.24)
HCT: 33 % — ABNORMAL LOW (ref 39.0–52.0)
HEMOGLOBIN: 11.2 g/dL — AB (ref 13.0–17.0)
Potassium: 4 mmol/L (ref 3.5–5.1)
Sodium: 134 mmol/L — ABNORMAL LOW (ref 135–145)
TCO2: 29 mmol/L (ref 22–32)

## 2017-06-01 MED ORDER — INSULIN ASPART 100 UNIT/ML ~~LOC~~ SOLN
10.0000 [IU] | Freq: Once | SUBCUTANEOUS | Status: AC
  Administered 2017-06-01: 10 [IU] via SUBCUTANEOUS
  Filled 2017-06-01: qty 1

## 2017-06-01 NOTE — ED Provider Notes (Signed)
Emergency Department Provider Note   I have reviewed the triage vital signs and the nursing notes.   HISTORY  Chief Complaint No chief complaint on file.   HPI Mike [name] is a 57 y.o. male who does not give much medical history but presents the emergency department today secondary to hyperglycemia.  Apparently patient was at [employer] drinking a soda and eating a sandwich when he messes cocks his blood sugar was high.  Of notes patient has been evaluated in this department multiple times and has a care plan in place for hyperglycemia.  Review of systems is otherwise negative. No other associated or modifying symptoms.    Past Medical History:  Diagnosis Date  . Anxiety   . Anxiety   . Chronic lower back pain   . Depression   . DKA (diabetic ketoacidoses) ([facility]) 07/30/2016  . Hyperlipemia   . Hypertension   . Migraine    "last one was ~ 4 yr ago" (12/23/2014)  . Seizures ([facility])    "related to pills for anxiety; if I don't take the pills I'm suppose to take I'll have them" (12/23/2014)  . Type II diabetes mellitus [facility])     Patient Active Problem List   Diagnosis Date Noted  . Hyperglycemia 05/26/2017  . Noncompliance 05/26/2017  . AKI (acute kidney injury) ([facility]) 03/04/2017  . Malnutrition of moderate degree 02/08/2017  . DKA (diabetic ketoacidoses) ([facility]) 02/07/2017  . History of stroke 02/07/2017  . Hyperphosphatemia 12/13/2016  . Evaluation by psychiatric service required 11/30/2016  . Lactic acidosis 11/18/2016  . Normocytic anemia 11/06/2016  . HLD (hyperlipidemia) 11/06/2016  . Adjustment disorder with other symptoms 11/05/2016  . Uncontrolled type 2 diabetes mellitus with hyperosmolar nonketotic hyperglycemia ([facility]) 08/24/2016  . Essential hypertension 08/24/2016  . Acute ischemic stroke ([facility])   . Protein-calorie malnutrition, severe 12/24/2014  . Homelessness 12/23/2014  . Hypertensive urgency 12/23/2014  . DM (diabetes mellitus) ([facility]) 12/23/2014  .  Intermittent palpitations 12/23/2014  . Tobacco abuse 12/23/2014  . Pleuritic chest pain 12/23/2014  . Abnormal EKG 12/23/2014  . Type II diabetes mellitus with renal manifestations [facility])     Past Surgical History:  Procedure Laterality Date  . NO PAST SURGERIES      Current Outpatient Rx  . Order #: [id number] Class: Normal  . Order #: [id number] Class: OTC  . Order #: [id number] Class: Print  . Order #: [id number] Class: Print  . Order #: [id number] Class: Print  . Order #: [id number] Class: Print  . Order #: [id number] Class: Print  . Order #: [id number] Class: Normal    Allergies Ibuprofen and Tylenol [acetaminophen]  Family History  Problem Relation Age of Onset  . Diabetes Mellitus II Mother   . Diabetes Mellitus II Father     Social History Social History   Tobacco Use  . Smoking status: Current Every Day Smoker    Packs/day: 0.10    Years: 40.00    Pack years: 4.00    Types: Cigarettes  . Smokeless tobacco: Never Used  Substance Use Topics  . Alcohol use: Yes    Alcohol/week: 1.2 oz    Types: 2 Cans of beer per week  . Drug use: No    Comment: 12/23/2014 "stopped ~ 10 yrs ago"    Review of Systems  All other systems negative except as documented in the HPI. All pertinent positives and negatives as reviewed in the HPI. ____________________________________________   PHYSICAL EXAM:  VITAL SIGNS: ED Triage Vitals  Enc Vitals Group     BP  06/01/17 1728 (!) 189/86     Pulse Rate 06/01/17 1728 93     Resp 06/01/17 1728 18     Temp 06/01/17 1728 99 F (37.2 C)     Temp Source 06/01/17 1728 Oral     SpO2 06/01/17 1728 97 %     Weight --      Height --      Head Circumference --      Peak Flow --      Pain Score 06/01/17 1639 0     Pain Loc --      Pain Edu? --      Excl. in GC? --     Constitutional: Alert. Significant body odor. Well appearing and in no acute distress. Eyes: Conjunctivae are normal. PERRL. EOMI. Head: Atraumatic. Nose: No  congestion/rhinnorhea. Mouth/Throat: Mucous membranes are moist.  Oropharynx non-erythematous. Neck: No stridor.  No meningeal signs.   Cardiovascular: Normal rate, regular rhythm. Good peripheral circulation. Grossly normal heart sounds.   Respiratory: Normal respiratory effort.  No retractions. Lungs CTAB. Gastrointestinal: Soft and nontender. No distention.  Musculoskeletal: No lower extremity tenderness nor edema. No gross deformities of extremities. Neurologic:  Normal speech and language. No gross focal neurologic deficits are appreciated.  Skin:  Skin is warm, dry and intact. No rash noted.   ____________________________________________   LABS (all labs ordered are listed, but only abnormal results are displayed)  Labs Reviewed  I-STAT CHEM 8, ED - Abnormal; Notable for the following components:      Result Value   Sodium 134 (*)    Chloride 95 (*)    Glucose, Bld >700 (*)    Hemoglobin 11.2 (*)    HCT 33.0 (*)    All other components within normal limits   ____________________________________________   INITIAL IMPRESSION / ASSESSMENT AND PLAN / ED COURSE  Uncomplicated hyperglycemia.  Insulin given. Discharge with pcp follow up as per care plan.   Pertinent labs & imaging results that were available during my care of the patient were reviewed by me and considered in my medical decision making (see chart for details).  ____________________________________________  FINAL CLINICAL IMPRESSION(S) / ED DIAGNOSES  Final diagnoses:  Hyperglycemia     MEDICATIONS GIVEN DURING THIS VISIT:  Medications  insulin aspart (novoLOG) injection 10 Units (10 Units Subcutaneous Given 06/01/17 2018)     NEW OUTPATIENT MEDICATIONS STARTED DURING THIS VISIT:  Discharge Medication List as of 06/01/2017  7:57 PM      Note:  This note was prepared with assistance of Dragon voice recognition software. Occasional wrong-word or sound-a-like substitutions may have occurred due to  the inherent limitations of voice recognition software.   Marily Memos, MD 06/01/17 [phone]

## 2017-06-01 NOTE — ED Notes (Signed)
Pt urinated on the floor in triage.

## 2017-06-01 NOTE — ED Triage Notes (Signed)
Pt BIB [facility] for high blood sugar. He was picked up from [facility] where he was drinking a soda and eating a meal.

## 2017-06-16 ENCOUNTER — Emergency Department (HOSPITAL_COMMUNITY)
Admission: EM | Admit: 2017-06-16 | Discharge: 2017-06-16 | Disposition: A | Discharge status: Home / Self Care | Payer: Self-pay | Attending: Emergency Medicine | Admitting: Emergency Medicine

## 2017-06-16 ENCOUNTER — Encounter (HOSPITAL_COMMUNITY): Payer: Self-pay | Admitting: Emergency Medicine

## 2017-06-16 ENCOUNTER — Emergency Department (HOSPITAL_COMMUNITY): Payer: Self-pay

## 2017-06-16 DIAGNOSIS — F17210 Nicotine dependence, cigarettes, uncomplicated: Secondary | ICD-10-CM | POA: Insufficient documentation

## 2017-06-16 DIAGNOSIS — E1165 Type 2 diabetes mellitus with hyperglycemia: Secondary | ICD-10-CM | POA: Insufficient documentation

## 2017-06-16 DIAGNOSIS — I10 Essential (primary) hypertension: Secondary | ICD-10-CM | POA: Insufficient documentation

## 2017-06-16 DIAGNOSIS — Z794 Long term (current) use of insulin: Secondary | ICD-10-CM | POA: Insufficient documentation

## 2017-06-16 DIAGNOSIS — Z9114 Patient's other noncompliance with medication regimen: Secondary | ICD-10-CM | POA: Insufficient documentation

## 2017-06-16 DIAGNOSIS — Z79899 Other long term (current) drug therapy: Secondary | ICD-10-CM | POA: Insufficient documentation

## 2017-06-16 DIAGNOSIS — R739 Hyperglycemia, unspecified: Secondary | ICD-10-CM

## 2017-06-16 LAB — COMPREHENSIVE METABOLIC PANEL
ALBUMIN: 3.5 g/dL (ref 3.5–5.0)
ALK PHOS: 149 U/L — AB (ref 38–126)
ALT: 108 U/L — AB (ref 17–63)
ANION GAP: 9 (ref 5–15)
AST: 153 U/L — AB (ref 15–41)
BUN: 25 mg/dL — ABNORMAL HIGH (ref 6–20)
CALCIUM: 9.1 mg/dL (ref 8.9–10.3)
CO2: 28 mmol/L (ref 22–32)
Chloride: 96 mmol/L — ABNORMAL LOW (ref 101–111)
Creatinine, Ser: 1.38 mg/dL — ABNORMAL HIGH (ref 0.61–1.24)
GFR calc Af Amer: >60 mL/min (ref >60)
GFR calc non Af Amer: 56 mL/min — ABNORMAL LOW (ref >60)
GLUCOSE: 694 mg/dL — AB (ref 65–99)
Potassium: 4.8 mmol/L (ref 3.5–5.1)
SODIUM: 133 mmol/L — AB (ref 135–145)
Total Bilirubin: 0.4 mg/dL (ref 0.3–1.2)
Total Protein: 7.2 g/dL (ref 6.5–8.1)

## 2017-06-16 LAB — I-STAT CHEM 8, ED
BUN: 26 mg/dL — ABNORMAL HIGH (ref 6–20)
CREATININE: 1.2 mg/dL (ref 0.61–1.24)
Calcium, Ion: 1.21 mmol/L (ref 1.15–1.40)
Chloride: 94 mmol/L — ABNORMAL LOW (ref 101–111)
Glucose, Bld: >700 mg/dL (ref 65–99)
HCT: 31 % — ABNORMAL LOW (ref 39.0–52.0)
HEMOGLOBIN: 10.5 g/dL — AB (ref 13.0–17.0)
POTASSIUM: 4.8 mmol/L (ref 3.5–5.1)
SODIUM: 133 mmol/L — AB (ref 135–145)
TCO2: 28 mmol/L (ref 22–32)

## 2017-06-16 LAB — DIFFERENTIAL
ABS IMMATURE GRANULOCYTES: 0 10*3/uL (ref 0.0–0.1)
Basophils Absolute: 0 10*3/uL (ref 0.0–0.1)
Basophils Relative: 1 %
EOS PCT: 0 %
Eosinophils Absolute: 0 10*3/uL (ref 0.0–0.7)
IMMATURE GRANULOCYTES: 0 %
LYMPHS PCT: 14 %
Lymphs Abs: 0.9 10*3/uL (ref 0.7–4.0)
Monocytes Absolute: 0.5 10*3/uL (ref 0.1–1.0)
Monocytes Relative: 9 %
NEUTROS ABS: 4.8 10*3/uL (ref 1.7–7.7)
Neutrophils Relative %: 76 %

## 2017-06-16 LAB — CBC
HEMATOCRIT: 31 % — AB (ref 39.0–52.0)
HEMOGLOBIN: 9.5 g/dL — AB (ref 13.0–17.0)
MCH: 25.1 pg — ABNORMAL LOW (ref 26.0–34.0)
MCHC: 30.6 g/dL (ref 30.0–36.0)
MCV: 82 fL (ref 78.0–100.0)
Platelets: 462 10*3/uL — ABNORMAL HIGH (ref 150–400)
RBC: 3.78 MIL/uL — AB (ref 4.22–5.81)
RDW: 13.1 % (ref 11.5–15.5)
WBC: 6.2 10*3/uL (ref 4.0–10.5)

## 2017-06-16 LAB — I-STAT TROPONIN, ED: Troponin i, poc: 0 ng/mL (ref 0.00–0.08)

## 2017-06-16 LAB — PROTIME-INR
INR: 0.93
Prothrombin Time: 12.3 seconds (ref 11.4–15.2)

## 2017-06-16 LAB — APTT: aPTT: 30 seconds (ref 24–36)

## 2017-06-16 LAB — CBG MONITORING, ED

## 2017-06-16 MED ORDER — INSULIN ASPART 100 UNIT/ML ~~LOC~~ SOLN
10.0000 [IU] | Freq: Once | SUBCUTANEOUS | Status: AC
  Administered 2017-06-16: 10 [IU] via SUBCUTANEOUS
  Filled 2017-06-16: qty 1

## 2017-06-16 NOTE — ED Notes (Signed)
Dr. Blinda LeatherwoodPollina notified of glucose >700 per mini lab.

## 2017-06-16 NOTE — ED Notes (Signed)
Mike [name] hutchinson  [phone], [phone]) [phone]  Friend to call at discharge.

## 2017-06-16 NOTE — ED Notes (Signed)
Notified RN Suzy BouchardKaren N istat chem 8 blood glucose results.

## 2017-06-16 NOTE — ED Triage Notes (Addendum)
Friend reports patient had a stroke several years ago and has had difficulty walking and increased confusion the last few days. Friend also reports he is slower at responding when spoken to as well.  Patient sitting in triage eating and will not answer questions for me.  Did answer questions by the end of triage.  Patient eating two bags of chips in triage.

## 2017-06-16 NOTE — ED Provider Notes (Signed)
[facility] EMERGENCY DEPARTMENT Provider Note   CSN: [id number] Arrival date & time: 06/16/17  0042     History   Chief Complaint Chief Complaint  Patient presents with  . Altered Mental Status    HPI Mike [name] is a 57 y.o. male.  Pt presents to the ED today with altered mental status.  Pt is well known to this ED for hyperglycemia and medication noncompliance.  The pt has been waiting for hours and denies any pain or complaints.  Altered mental status work up started in triage.  Pt was eating chips when he was triaged.  He does not follow a diabetic diet and will not take his medications.  He is on a care plan.     Past Medical History:  Diagnosis Date  . Anxiety   . Anxiety   . Chronic lower back pain   . Depression   . DKA (diabetic ketoacidoses) ([place]) 07/30/2016  . Hyperlipemia   . Hypertension   . Migraine    "last one was ~ 4 yr ago" (12/23/2014)  . Seizures ([place])    "related to pills for anxiety; if I don't take the pills I'm suppose to take I'll have them" (12/23/2014)  . Type II diabetes mellitus [facility])     Patient Active Problem List   Diagnosis Date Noted  . Hyperglycemia 05/26/2017  . Noncompliance 05/26/2017  . AKI (acute kidney injury) ([place]) 03/04/2017  . Malnutrition of moderate degree 02/08/2017  . DKA (diabetic ketoacidoses) ([place]) 02/07/2017  . History of stroke 02/07/2017  . Hyperphosphatemia 12/13/2016  . Evaluation by psychiatric service required 11/30/2016  . Lactic acidosis 11/18/2016  . Normocytic anemia 11/06/2016  . HLD (hyperlipidemia) 11/06/2016  . Adjustment disorder with other symptoms 11/05/2016  . Uncontrolled type 2 diabetes mellitus with hyperosmolar nonketotic hyperglycemia ([place]) 08/24/2016  . Essential hypertension 08/24/2016  . Acute ischemic stroke ([place])   . Protein-calorie malnutrition, severe 12/24/2014  . Homelessness 12/23/2014  . Hypertensive urgency 12/23/2014  . DM (diabetes mellitus) ([place])  12/23/2014  . Intermittent palpitations 12/23/2014  . Tobacco abuse 12/23/2014  . Pleuritic chest pain 12/23/2014  . Abnormal EKG 12/23/2014  . Type II diabetes mellitus with renal manifestations [facility])     Past Surgical History:  Procedure Laterality Date  . NO PAST SURGERIES          Home Medications    Prior to Admission medications   Medication Sig Start Date End Date Taking? Authorizing Provider  amLODipine (NORVASC) 10 MG tablet Take 1 tablet (10 mg total) by mouth daily. 05/28/17   Dessa Phi, DO  aspirin 325 MG tablet Take 1 tablet (325 mg total) by mouth daily. 03/08/17   Georgette Shell, MD  atorvastatin (LIPITOR) 40 MG tablet Take 1 tablet (40 mg total) by mouth daily. 01/26/17   Cardama, Grayce Sessions, MD  blood glucose meter kit and supplies KIT Dispense based on patient and insurance preference. Use up to four times daily as directed. (FOR ICD-9 250.00, 250.01). 11/22/16   Velvet Bathe, MD  ferrous sulfate 325 (65 FE) MG tablet Take 1 tablet (325 mg total) daily by mouth. Patient taking differently: Take 325 mg by mouth daily as needed (low iron).  11/23/16   Ward, Delice Bison, DO  insulin aspart protamine- aspart (NOVOLOG MIX 70/30) (70-30) 100 UNIT/ML injection Inject 0.3 mLs (30 Units total) into the skin 2 (two) times daily with a meal. 04/30/17   Dorie Rank, MD  lisinopril (PRINIVIL,ZESTRIL) 40 MG  tablet Take 1 tablet (40 mg total) by mouth daily. 04/30/17   Dorie Rank, MD  metoprolol tartrate (LOPRESSOR) 25 MG tablet Take 0.5 tablets (12.5 mg total) by mouth 2 (two) times daily. 03/07/17   Georgette Shell, MD    Family History Family History  Problem Relation Age of Onset  . Diabetes Mellitus II Mother   . Diabetes Mellitus II Father     Social History Social History   Tobacco Use  . Smoking status: Current Every Day Smoker    Packs/day: 0.10    Years: 40.00    Pack years: 4.00    Types: Cigarettes  . Smokeless tobacco: Never Used  Substance Use  Topics  . Alcohol use: Yes    Alcohol/week: 1.2 oz    Types: 2 Cans of beer per week  . Drug use: No    Comment: 12/23/2014 "stopped ~ 10 yrs ago"     Allergies   Ibuprofen and Tylenol [acetaminophen]   Review of Systems Review of Systems  Neurological: Positive for weakness.     Physical Exam Updated Vital Signs BP (!) 180/85 (BP Location: Right Arm)   Pulse 66   Temp 99.3 F (37.4 C) (Oral)   Resp 18   Ht _0  (1.93 m)   Wt 77.1 kg (170 lb)   SpO2 100%   BMI 20.69 kg/m   Physical Exam  Constitutional: He appears well-developed and well-nourished.  HENT:  Head: Normocephalic and atraumatic.  Right Ear: External ear normal.  Left Ear: External ear normal.  Nose: Nose normal.  Mouth/Throat: Oropharynx is clear and moist.  Eyes: Pupils are equal, round, and reactive to light. Conjunctivae and EOM are normal.  Neck: Normal range of motion. Neck supple.  Cardiovascular: Normal rate, regular rhythm, normal heart sounds and intact distal pulses.  Pulmonary/Chest: Effort normal and breath sounds normal.  Abdominal: Soft. Bowel sounds are normal.  Musculoskeletal: Normal range of motion.  Neurological: He is alert.  Pt is not normally talkative and will not normally answer any questions.  Today is the same.  He is following commands.  He is moving all 4 extremities.  Skin: Skin is warm. Capillary refill takes less than 2 seconds.  Nursing note and vitals reviewed.    ED Treatments / Results  Labs (all labs ordered are listed, but only abnormal results are displayed) Labs Reviewed  CBC - Abnormal; Notable for the following components:      Result Value   RBC 3.78 (*)    Hemoglobin 9.5 (*)    HCT 31.0 (*)    MCH 25.1 (*)    Platelets 462 (*)    All other components within normal limits  COMPREHENSIVE METABOLIC PANEL - Abnormal; Notable for the following components:   Sodium 133 (*)    Chloride 96 (*)    Glucose, Bld 694 (*)    BUN 25 (*)    Creatinine, Ser  1.38 (*)    AST 153 (*)    ALT 108 (*)    Alkaline Phosphatase 149 (*)    GFR calc non Af Amer 56 (*)    All other components within normal limits  CBG MONITORING, ED - Abnormal; Notable for the following components:   Glucose-Capillary >600 (*)    All other components within normal limits  I-STAT CHEM 8, ED - Abnormal; Notable for the following components:   Sodium 133 (*)    Chloride 94 (*)    BUN 26 (*)  Glucose, Bld >700 (*)    Hemoglobin 10.5 (*)    HCT 31.0 (*)    All other components within normal limits  PROTIME-INR  APTT  DIFFERENTIAL  I-STAT TROPONIN, ED    EKG None  Radiology Ct Head Wo Contrast  Result Date: 06/16/2017 CLINICAL DATA:  57 year old male with altered mental status, ataxia and confusion. EXAM: CT HEAD WITHOUT CONTRAST TECHNIQUE: Contiguous axial images were obtained from the base of the skull through the vertex without intravenous contrast. 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 Periventricular hypodensities may represent transependymal CSF resorption. Atrophy and chronic small-vessel white matter ischemic changes again noted. No hemorrhage, acute infarct, midline shift, mass lesion or extra-axial collection identified. Vascular: No hyperdense vessel or unexpected calcification. Skull: Normal. Negative for fracture or focal lesion. Sinuses/Orbits: No acute finding. Other: None. IMPRESSION: 1. 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 2. Atrophy and chronic small-vessel white matter ischemic changes. Electronically Signed   By: Margarette Canada M.D.   On: 06/16/2017 02:44    Procedures Procedures (including critical care time)  Medications Ordered in ED Medications  insulin aspart (novoLOG) injection 10 Units (has no  administration in time range)     Initial Impression / Assessment and Plan / ED Course  I have reviewed the triage vital signs and the nursing notes.  Pertinent labs & imaging results that were available during my care of the patient were reviewed by me and considered in my medical decision making (see chart for details).     Per care plan, pt is not in DKA.  He will be given a dose of insulin and d/c home.  Return if worse.  F/u with pcp.  Final Clinical Impressions(s) / ED Diagnoses   Final diagnoses:  Hyperglycemia  Noncompliance with medication regimen    ED Discharge Orders    None       Isla Pence, MD 06/16/17 [phone]

## 2017-06-16 NOTE — ED Notes (Signed)
Pt in room no family @ bedside currently. VS documented Call light within reach and pt was instructed on how to use

## 2017-06-16 NOTE — ED Notes (Signed)
GLUCOSE [id number] per LAB

## 2017-06-17 ENCOUNTER — Encounter (HOSPITAL_COMMUNITY): Payer: Self-pay | Admitting: *Deleted

## 2017-06-17 ENCOUNTER — Emergency Department (HOSPITAL_COMMUNITY)
Admission: EM | Admit: 2017-06-17 | Discharge: 2017-06-17 | Disposition: A | Discharge status: Home / Self Care | Payer: Self-pay | Attending: Emergency Medicine | Admitting: Emergency Medicine

## 2017-06-17 DIAGNOSIS — F419 Anxiety disorder, unspecified: Secondary | ICD-10-CM | POA: Insufficient documentation

## 2017-06-17 DIAGNOSIS — R739 Hyperglycemia, unspecified: Secondary | ICD-10-CM

## 2017-06-17 DIAGNOSIS — Z8673 Personal history of transient ischemic attack (TIA), and cerebral infarction without residual deficits: Secondary | ICD-10-CM | POA: Insufficient documentation

## 2017-06-17 DIAGNOSIS — Z7982 Long term (current) use of aspirin: Secondary | ICD-10-CM | POA: Insufficient documentation

## 2017-06-17 DIAGNOSIS — Z590 Homelessness: Secondary | ICD-10-CM | POA: Insufficient documentation

## 2017-06-17 DIAGNOSIS — Z79899 Other long term (current) drug therapy: Secondary | ICD-10-CM | POA: Insufficient documentation

## 2017-06-17 DIAGNOSIS — F329 Major depressive disorder, single episode, unspecified: Secondary | ICD-10-CM | POA: Insufficient documentation

## 2017-06-17 DIAGNOSIS — F17210 Nicotine dependence, cigarettes, uncomplicated: Secondary | ICD-10-CM | POA: Insufficient documentation

## 2017-06-17 DIAGNOSIS — I10 Essential (primary) hypertension: Secondary | ICD-10-CM | POA: Insufficient documentation

## 2017-06-17 DIAGNOSIS — Z794 Long term (current) use of insulin: Secondary | ICD-10-CM | POA: Insufficient documentation

## 2017-06-17 DIAGNOSIS — E1165 Type 2 diabetes mellitus with hyperglycemia: Secondary | ICD-10-CM | POA: Insufficient documentation

## 2017-06-17 LAB — I-STAT CHEM 8, ED
BUN: 23 mg/dL — ABNORMAL HIGH (ref 6–20)
CALCIUM ION: 1.15 mmol/L (ref 1.15–1.40)
CREATININE: 0.9 mg/dL (ref 0.61–1.24)
Chloride: 89 mmol/L — ABNORMAL LOW (ref 101–111)
HCT: 33 % — ABNORMAL LOW (ref 39.0–52.0)
HEMOGLOBIN: 11.2 g/dL — AB (ref 13.0–17.0)
Potassium: 4.9 mmol/L (ref 3.5–5.1)
SODIUM: 126 mmol/L — AB (ref 135–145)
TCO2: 28 mmol/L (ref 22–32)

## 2017-06-17 LAB — CBG MONITORING, ED
Glucose-Capillary: >600 mg/dL (ref 65–99)
Glucose-Capillary: 292 mg/dL — ABNORMAL HIGH (ref 65–99)

## 2017-06-17 MED ORDER — SODIUM CHLORIDE 0.9 % IV BOLUS: 1000.0000 mL | Freq: Once | INTRAVENOUS | Status: DC

## 2017-06-17 MED ORDER — INSULIN ASPART 100 UNIT/ML ~~LOC~~ SOLN
10.0000 [IU] | Freq: Once | SUBCUTANEOUS | Status: AC
  Administered 2017-06-17: 10 [IU] via SUBCUTANEOUS
  Filled 2017-06-17: qty 1

## 2017-06-17 NOTE — ED Notes (Signed)
Bed: WA06 Expected date:  Expected time:  Means of arrival:  Comments: 

## 2017-06-17 NOTE — ED Notes (Signed)
I gave critical I Stat CG4 results [facility]

## 2017-06-17 NOTE — Discharge Instructions (Signed)
You were seen here today for hyperglycemia.  You were treated in the department with insulin.  Please take your home medications.  Please follow-up with your primary care provider this week. If you develop worsening or new concerning symptoms you can return to the emergency department for re-evaluation.

## 2017-06-17 NOTE — ED Provider Notes (Signed)
[facility] DEPT Provider Note   CSN: [id number] Arrival date & time: 06/17/17  1620     History   Chief Complaint Chief Complaint  Patient presents with  . Hyperglycemia    HPI Cher Raczkowski is a 57 y.o. male with history of diabetes who is well-known to the department with 71 visits of the last 6 months who presents emergency department today for hypoglycemia.  Patient was brought in by EMS from [facility] where he was drinking a large soda and has complaints of hyperglycemia.  His CBG was noted to be high.  This is a recurrent problem.  He denies any other associated symptoms.  He notes he is noncompliant with medication.  HPI  Past Medical History:  Diagnosis Date  . Anxiety   . Anxiety   . Chronic lower back pain   . Depression   . DKA (diabetic ketoacidoses) ([place]) 07/30/2016  . Hyperlipemia   . Hypertension   . Migraine    "last one was ~ 4 yr ago" (12/23/2014)  . Seizures ([place])    "related to pills for anxiety; if I don't take the pills I'm suppose to take I'll have them" (12/23/2014)  . Type II diabetes mellitus [facility])     Patient Active Problem List   Diagnosis Date Noted  . Hyperglycemia 05/26/2017  . Noncompliance 05/26/2017  . AKI (acute kidney injury) ([place]) 03/04/2017  . Malnutrition of moderate degree 02/08/2017  . DKA (diabetic ketoacidoses) ([place]) 02/07/2017  . History of stroke 02/07/2017  . Hyperphosphatemia 12/13/2016  . Evaluation by psychiatric service required 11/30/2016  . Lactic acidosis 11/18/2016  . Normocytic anemia 11/06/2016  . HLD (hyperlipidemia) 11/06/2016  . Adjustment disorder with other symptoms 11/05/2016  . Uncontrolled type 2 diabetes mellitus with hyperosmolar nonketotic hyperglycemia ([place]) 08/24/2016  . Essential hypertension 08/24/2016  . Acute ischemic stroke ([place])   . Protein-calorie malnutrition, severe 12/24/2014  . Homelessness 12/23/2014  . Hypertensive urgency 12/23/2014  . DM (diabetes  mellitus) ([place]) 12/23/2014  . Intermittent palpitations 12/23/2014  . Tobacco abuse 12/23/2014  . Pleuritic chest pain 12/23/2014  . Abnormal EKG 12/23/2014  . Type II diabetes mellitus with renal manifestations [facility])     Past Surgical History:  Procedure Laterality Date  . NO PAST SURGERIES          Home Medications    Prior to Admission medications   Medication Sig Start Date End Date Taking? Authorizing Provider  amLODipine (NORVASC) 10 MG tablet Take 1 tablet (10 mg total) by mouth daily. 05/28/17   Dessa Phi, DO  aspirin 325 MG tablet Take 1 tablet (325 mg total) by mouth daily. 03/08/17   Georgette Shell, MD  atorvastatin (LIPITOR) 40 MG tablet Take 1 tablet (40 mg total) by mouth daily. 01/26/17   Cardama, Grayce Sessions, MD  blood glucose meter kit and supplies KIT Dispense based on patient and insurance preference. Use up to four times daily as directed. (FOR ICD-9 250.00, 250.01). 11/22/16   Velvet Bathe, MD  ferrous sulfate 325 (65 FE) MG tablet Take 1 tablet (325 mg total) daily by mouth. Patient taking differently: Take 325 mg by mouth daily as needed (low iron).  11/23/16   Ward, Delice Bison, DO  insulin aspart protamine- aspart (NOVOLOG MIX 70/30) (70-30) 100 UNIT/ML injection Inject 0.3 mLs (30 Units total) into the skin 2 (two) times daily with a meal. 04/30/17   Dorie Rank, MD  lisinopril (PRINIVIL,ZESTRIL) 40 MG tablet Take 1 tablet (40 mg  total) by mouth daily. 04/30/17   Dorie Rank, MD  metoprolol tartrate (LOPRESSOR) 25 MG tablet Take 0.5 tablets (12.5 mg total) by mouth 2 (two) times daily. 03/07/17   Georgette Shell, MD    Family History Family History  Problem Relation Age of Onset  . Diabetes Mellitus II Mother   . Diabetes Mellitus II Father     Social History Social History   Tobacco Use  . Smoking status: Current Every Day Smoker    Packs/day: 0.10    Years: 40.00    Pack years: 4.00    Types: Cigarettes  . Smokeless tobacco: Never Used   Substance Use Topics  . Alcohol use: Yes    Alcohol/week: 1.2 oz    Types: 2 Cans of beer per week  . Drug use: No    Comment: 12/23/2014 "stopped ~ 10 yrs ago"     Allergies   Ibuprofen and Tylenol [acetaminophen]   Review of Systems Review of Systems  All other systems reviewed and are negative.    Physical Exam Updated Vital Signs BP (!) 176/109   Pulse 77   Temp 99 F (37.2 C) (Oral)   Resp 18   SpO2 100%   Physical Exam  Constitutional: He appears well-developed and well-nourished.  Disheveled in appearance  HENT:  Head: Normocephalic and atraumatic.  Right Ear: External ear normal.  Left Ear: External ear normal.  Nose: Nose normal.  Mouth/Throat: Uvula is midline, oropharynx is clear and moist and mucous membranes are normal. No tonsillar exudate.  Eyes: Pupils are equal, round, and reactive to light. Right eye exhibits no discharge. Left eye exhibits no discharge. No scleral icterus.  Neck: Trachea normal. Neck supple. No spinous process tenderness present. No neck rigidity. Normal range of motion present.  Cardiovascular: Normal rate, regular rhythm and intact distal pulses.  No murmur heard. Pulses:      Radial pulses are 2+ on the right side, and 2+ on the left side.       Dorsalis pedis pulses are 2+ on the right side, and 2+ on the left side.       Posterior tibial pulses are 2+ on the right side, and 2+ on the left side.  Pulmonary/Chest: Effort normal and breath sounds normal. He exhibits no tenderness.  Abdominal: Soft. Bowel sounds are normal. There is no tenderness. There is no rebound and no guarding.  Musculoskeletal: He exhibits no edema.  Lymphadenopathy:    He has no cervical adenopathy.  Neurological: He is alert.  Skin: Skin is warm and dry. No rash noted. He is not diaphoretic.  Psychiatric: He has a normal mood and affect.  Nursing note and vitals reviewed.    ED Treatments / Results  Labs (all labs ordered are listed, but only  abnormal results are displayed) Labs Reviewed  CBG MONITORING, ED - Abnormal; Notable for the following components:      Result Value   Glucose-Capillary >600 (*)    All other components within normal limits  I-STAT CHEM 8, ED - Abnormal; Notable for the following components:   Sodium 126 (*)    Chloride 89 (*)    BUN 23 (*)    Glucose, Bld >700 (*)    Hemoglobin 11.2 (*)    HCT 33.0 (*)    All other components within normal limits  CBG MONITORING, ED - Abnormal; Notable for the following components:   Glucose-Capillary >600 (*)    All other components within normal limits  CBG MONITORING, ED - Abnormal; Notable for the following components:   Glucose-Capillary 292 (*)    All other components within normal limits    EKG None  Radiology Ct Head Wo Contrast  Result Date: 06/16/2017 CLINICAL DATA:  57 year old male with altered mental status, ataxia and confusion. EXAM: CT HEAD WITHOUT CONTRAST TECHNIQUE: Contiguous axial images were obtained from the base of the skull through the vertex without intravenous contrast. 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 Periventricular hypodensities may represent transependymal CSF resorption. Atrophy and chronic small-vessel white matter ischemic changes again noted. No hemorrhage, acute infarct, midline shift, mass lesion or extra-axial collection identified. Vascular: No hyperdense vessel or unexpected calcification. Skull: Normal. Negative for fracture or focal lesion. Sinuses/Orbits: No acute finding. Other: None. IMPRESSION: 1. 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 2. Atrophy and chronic small-vessel white matter ischemic changes. Electronically Signed   By: Margarette Canada M.D.   On: 06/16/2017 02:44     Procedures Procedures (including critical care time)  Medications Ordered in ED Medications  insulin aspart (novoLOG) injection 10 Units (has no administration in time range)     Initial Impression / Assessment and Plan / ED Course  I have reviewed the triage vital signs and the nursing notes.  Pertinent labs & imaging results that were available during my care of the patient were reviewed by me and considered in my medical decision making (see chart for details).     57 y.o. male is well-known to the department was 66 visits the last 6 months who presents with hyperglycemia.  He denies any other symptoms at this time.  Vitals reviewed.  CBG is greater than 400.  I-STAT Chem-8 ordered.  10 units of subcutaneous insulin given.  I-STAT Chem-8 reviewed.  Hyperglycemia is noted.  He is noted to have hyponatremia.  Corrected hyponatremia noted to be 136-140.  No further treatment needed.  Patient's calculated anion gap 9.  No anion gap acidosis.  No evidence of DKA.  Patient's CBG now 292.  Will discharge patient home.  Recommend he take his home medication.  Recommend he follow-up with his PCP this week.  Return precautions advised.  Patient to be discharged.  Final Clinical Impressions(s) / ED Diagnoses   Final diagnoses:  Hyperglycemia    ED Discharge Orders    None       Lorelle Gibbs 06/17/17 2029    Lajean Saver, MD 06/17/17 2237

## 2017-06-17 NOTE — ED Notes (Signed)
Bed: WLPT4 Expected date:  Expected time:  Means of arrival:  Comments: hyperglycemia

## 2017-06-17 NOTE — ED Triage Notes (Signed)
Per EMS, pt complains of elevated CBG. Pt denies other complains. [facility]

## 2017-06-18 ENCOUNTER — Other Ambulatory Visit: Payer: Self-pay

## 2017-06-18 ENCOUNTER — Encounter (HOSPITAL_COMMUNITY): Payer: Self-pay

## 2017-06-18 ENCOUNTER — Encounter (HOSPITAL_COMMUNITY): Payer: Self-pay | Admitting: *Deleted

## 2017-06-18 ENCOUNTER — Emergency Department (HOSPITAL_COMMUNITY)
Admission: EM | Admit: 2017-06-18 | Discharge: 2017-06-18 | Disposition: A | Discharge status: Home / Self Care | Payer: Self-pay | Attending: Emergency Medicine | Admitting: Emergency Medicine

## 2017-06-18 DIAGNOSIS — E1110 Type 2 diabetes mellitus with ketoacidosis without coma: Secondary | ICD-10-CM | POA: Insufficient documentation

## 2017-06-18 DIAGNOSIS — Z7982 Long term (current) use of aspirin: Secondary | ICD-10-CM | POA: Insufficient documentation

## 2017-06-18 DIAGNOSIS — I10 Essential (primary) hypertension: Secondary | ICD-10-CM | POA: Insufficient documentation

## 2017-06-18 DIAGNOSIS — E1165 Type 2 diabetes mellitus with hyperglycemia: Secondary | ICD-10-CM | POA: Insufficient documentation

## 2017-06-18 DIAGNOSIS — Z8673 Personal history of transient ischemic attack (TIA), and cerebral infarction without residual deficits: Secondary | ICD-10-CM | POA: Insufficient documentation

## 2017-06-18 DIAGNOSIS — F17210 Nicotine dependence, cigarettes, uncomplicated: Secondary | ICD-10-CM | POA: Insufficient documentation

## 2017-06-18 DIAGNOSIS — Z794 Long term (current) use of insulin: Secondary | ICD-10-CM | POA: Insufficient documentation

## 2017-06-18 DIAGNOSIS — Z79899 Other long term (current) drug therapy: Secondary | ICD-10-CM | POA: Insufficient documentation

## 2017-06-18 DIAGNOSIS — R739 Hyperglycemia, unspecified: Secondary | ICD-10-CM

## 2017-06-18 DIAGNOSIS — E119 Type 2 diabetes mellitus without complications: Secondary | ICD-10-CM | POA: Insufficient documentation

## 2017-06-18 LAB — I-STAT CHEM 8, ED
BUN: 22 mg/dL — AB (ref 6–20)
BUN: 17 mg/dL (ref 6–20)
CALCIUM ION: 1.17 mmol/L (ref 1.15–1.40)
Calcium, Ion: 1.23 mmol/L (ref 1.15–1.40)
Chloride: 93 mmol/L — ABNORMAL LOW (ref 101–111)
Chloride: 93 mmol/L — ABNORMAL LOW (ref 101–111)
Creatinine, Ser: 0.9 mg/dL (ref 0.61–1.24)
Creatinine, Ser: 0.8 mg/dL (ref 0.61–1.24)
Glucose, Bld: 536 mg/dL (ref 65–99)
HCT: 26 % — ABNORMAL LOW (ref 39.0–52.0)
HEMATOCRIT: 31 % — AB (ref 39.0–52.0)
HEMOGLOBIN: 10.5 g/dL — AB (ref 13.0–17.0)
HEMOGLOBIN: 8.8 g/dL — AB (ref 13.0–17.0)
POTASSIUM: 4.2 mmol/L (ref 3.5–5.1)
Potassium: 4.5 mmol/L (ref 3.5–5.1)
Sodium: 132 mmol/L — ABNORMAL LOW (ref 135–145)
Sodium: 130 mmol/L — ABNORMAL LOW (ref 135–145)
TCO2: 31 mmol/L (ref 22–32)
TCO2: 28 mmol/L (ref 22–32)

## 2017-06-18 LAB — CBG MONITORING, ED
Glucose-Capillary: >600 mg/dL (ref 65–99)
Glucose-Capillary: 402 mg/dL — ABNORMAL HIGH (ref 65–99)
Glucose-Capillary: 502 mg/dL (ref 65–99)

## 2017-06-18 MED ORDER — INSULIN ASPART 100 UNIT/ML ~~LOC~~ SOLN
18.0000 [IU] | Freq: Once | SUBCUTANEOUS | Status: DC
  Filled 2017-06-18: qty 1

## 2017-06-18 MED ORDER — INSULIN ASPART 100 UNIT/ML ~~LOC~~ SOLN
18.0000 [IU] | Freq: Once | SUBCUTANEOUS | Status: AC
  Administered 2017-06-18: 18 [IU] via SUBCUTANEOUS

## 2017-06-18 MED ORDER — INSULIN ASPART 100 UNIT/ML ~~LOC~~ SOLN
10.0000 [IU] | Freq: Once | SUBCUTANEOUS | Status: AC
  Administered 2017-06-18: 10 [IU] via SUBCUTANEOUS
  Filled 2017-06-18: qty 1

## 2017-06-18 NOTE — ED Triage Notes (Signed)
[facility]

## 2017-06-18 NOTE — Discharge Instructions (Signed)
You were seen here today for elevated blood sugar that was treated in the department. Please avoid sugary foods and take you home medications. Follow up with your PCP tomorrow. If you develop worsening or new concerning symptoms you can return to the emergency department for re-evaluation.

## 2017-06-18 NOTE — ED Notes (Signed)
Pt stated "I stay wherever I can find.  [facility]

## 2017-06-18 NOTE — ED Notes (Signed)
I gave critical I Stat Chem 8 [facility]

## 2017-06-18 NOTE — ED Triage Notes (Signed)
Pt brought in by [facility].  [facility]  Pt c/o high blood sugar.

## 2017-06-18 NOTE — Discharge Instructions (Addendum)
Take your medicines and stop eating sugar.

## 2017-06-18 NOTE — ED Provider Notes (Signed)
[facility] DEPT Provider Note   CSN: [id number] Arrival date & time: 06/18/17  1926     History   Chief Complaint Chief Complaint  Patient presents with  . Hyperglycemia    HPI Mike [name] is a 57 y.o. male male with a history of diabetes who is well-known to the department was 73 visits the last 6 months he presents the emergent department today for hyperglycemia.  Patient was brought in from [facility] after he was eating sugary foods.  He denies any complaints at this current time.  He is currently asymptomatic.  He notes he is noncompliant with medication and has not followed up with his primary care doctor. No other complaints at this time.   HPI  Past Medical History:  Diagnosis Date  . Anxiety   . Anxiety   . Chronic lower back pain   . Depression   . DKA (diabetic ketoacidoses) ([place]) 07/30/2016  . Hyperlipemia   . Hypertension   . Migraine    "last one was ~ 4 yr ago" (12/23/2014)  . Seizures ([place])    "related to pills for anxiety; if I don't take the pills I'm suppose to take I'll have them" (12/23/2014)  . Type II diabetes mellitus [facility])     Patient Active Problem List   Diagnosis Date Noted  . Hyperglycemia 05/26/2017  . Noncompliance 05/26/2017  . AKI (acute kidney injury) ([place]) 03/04/2017  . Malnutrition of moderate degree 02/08/2017  . DKA (diabetic ketoacidoses) ([place]) 02/07/2017  . History of stroke 02/07/2017  . Hyperphosphatemia 12/13/2016  . Evaluation by psychiatric service required 11/30/2016  . Lactic acidosis 11/18/2016  . Normocytic anemia 11/06/2016  . HLD (hyperlipidemia) 11/06/2016  . Adjustment disorder with other symptoms 11/05/2016  . Uncontrolled type 2 diabetes mellitus with hyperosmolar nonketotic hyperglycemia ([place]) 08/24/2016  . Essential hypertension 08/24/2016  . Acute ischemic stroke ([place])   . Protein-calorie malnutrition, severe 12/24/2014  . Homelessness 12/23/2014  . Hypertensive urgency  12/23/2014  . DM (diabetes mellitus) ([place]) 12/23/2014  . Intermittent palpitations 12/23/2014  . Tobacco abuse 12/23/2014  . Pleuritic chest pain 12/23/2014  . Abnormal EKG 12/23/2014  . Type II diabetes mellitus with renal manifestations [facility])     Past Surgical History:  Procedure Laterality Date  . NO PAST SURGERIES          Home Medications    Prior to Admission medications   Medication Sig Start Date End Date Taking? Authorizing Provider  amLODipine (NORVASC) 10 MG tablet Take 1 tablet (10 mg total) by mouth daily. 05/28/17  Yes Dessa Phi, DO  aspirin 325 MG tablet Take 1 tablet (325 mg total) by mouth daily. 03/08/17  Yes Georgette Shell, MD  atorvastatin (LIPITOR) 40 MG tablet Take 1 tablet (40 mg total) by mouth daily. 01/26/17  Yes Cardama, Grayce Sessions, MD  blood glucose meter kit and supplies KIT Dispense based on patient and insurance preference. Use up to four times daily as directed. (FOR ICD-9 250.00, 250.01). 11/22/16  Yes Velvet Bathe, MD  ferrous sulfate 325 (65 FE) MG tablet Take 1 tablet (325 mg total) daily by mouth. Patient taking differently: Take 325 mg by mouth daily as needed (low iron).  11/23/16  Yes Ward, Cyril Mourning N, DO  insulin aspart protamine- aspart (NOVOLOG MIX 70/30) (70-30) 100 UNIT/ML injection Inject 0.3 mLs (30 Units total) into the skin 2 (two) times daily with a meal. 04/30/17  Yes Dorie Rank, MD  lisinopril (PRINIVIL,ZESTRIL) 40 MG tablet Take  1 tablet (40 mg total) by mouth daily. 04/30/17  Yes Dorie Rank, MD  metoprolol tartrate (LOPRESSOR) 25 MG tablet Take 0.5 tablets (12.5 mg total) by mouth 2 (two) times daily. 03/07/17  Yes Georgette Shell, MD    Family History Family History  Problem Relation Age of Onset  . Diabetes Mellitus II Mother   . Diabetes Mellitus II Father     Social History Social History   Tobacco Use  . Smoking status: Current Every Day Smoker    Packs/day: 0.10    Years: 40.00    Pack years: 4.00     Types: Cigarettes  . Smokeless tobacco: Never Used  Substance Use Topics  . Alcohol use: Yes    Alcohol/week: 1.2 oz    Types: 2 Cans of beer per week  . Drug use: No    Comment: 12/23/2014 "stopped ~ 10 yrs ago"     Allergies   Ibuprofen and Tylenol [acetaminophen]   Review of Systems Review of Systems  All other systems reviewed and are negative.    Physical Exam Updated Vital Signs BP (!) 176/83 (BP Location: Right Arm)   Pulse 78   Temp 98.4 F (36.9 C) (Oral)   Resp 19   Ht '6\' 4"'$  (1.93 m)   Wt 77.1 kg (170 lb)   SpO2 99%   BMI 20.69 kg/m   Physical Exam  Constitutional: He appears well-developed and well-nourished.  Disheveled in appearance  HENT:  Head: Normocephalic and atraumatic.  Right Ear: External ear normal.  Left Ear: External ear normal.  Nose: Nose normal.  Mouth/Throat: Uvula is midline, oropharynx is clear and moist and mucous membranes are normal. No tonsillar exudate.  Eyes: Pupils are equal, round, and reactive to light. Right eye exhibits no discharge. Left eye exhibits no discharge. No scleral icterus.  Neck: Trachea normal. Neck supple. No spinous process tenderness present. No neck rigidity. Normal range of motion present.  Cardiovascular: Normal rate, regular rhythm and intact distal pulses.  No murmur heard. Pulses:      Radial pulses are 2+ on the right side, and 2+ on the left side.       Dorsalis pedis pulses are 2+ on the right side, and 2+ on the left side.       Posterior tibial pulses are 2+ on the right side, and 2+ on the left side.  No lower extremity swelling or edema. Calves symmetric in size bilaterally.  Pulmonary/Chest: Effort normal and breath sounds normal. He exhibits no tenderness.  Abdominal: Soft. Bowel sounds are normal. There is no tenderness. There is no rebound and no guarding.  Musculoskeletal: He exhibits no edema.  Lymphadenopathy:    He has no cervical adenopathy.  Neurological: He is alert.  Skin: Skin  is warm and dry. No rash noted. He is not diaphoretic.  Psychiatric: He has a normal mood and affect.  Nursing note and vitals reviewed.    ED Treatments / Results  Labs (all labs ordered are listed, but only abnormal results are displayed) Labs Reviewed  CBG MONITORING, ED - Abnormal; Notable for the following components:      Result Value   Glucose-Capillary >600 (*)    All other components within normal limits  I-STAT CHEM 8, ED - Abnormal; Notable for the following components:   Sodium 130 (*)    Chloride 93 (*)    Glucose, Bld >700 (*)    Hemoglobin 8.8 (*)    HCT 26.0 (*)  All other components within normal limits  CBG MONITORING, ED - Abnormal; Notable for the following components:   Glucose-Capillary 502 (*)    All other components within normal limits  CBG MONITORING, ED - Abnormal; Notable for the following components:   Glucose-Capillary 402 (*)    All other components within normal limits  CBG MONITORING, ED    EKG None  Radiology No results found.  Procedures Procedures (including critical care time)  Medications Ordered in ED Medications  insulin aspart (novoLOG) injection 10 Units (10 Units Subcutaneous Given 06/18/17 2034)     Initial Impression / Assessment and Plan / ED Course  I have reviewed the triage vital signs and the nursing notes.  Pertinent labs & imaging results that were available during my care of the patient were reviewed by me and considered in my medical decision making (see chart for details).     57 y.o. male is well-known to the department with 73 visits in the last 6 months who presents emergency department today for hyperglycemia.  This is a recurring problem.  Initial CBG greater than 600.  I-STAT Chem-8 ordered.  Calculated anion gap without evidence of DKA.  Patient given 10 units of insulin given in the department with improvement of hyperglycemia to now <500. Did discuss Hgb results with my attending.  No further work-up  indicated at this time.  Will discharge patient home.  Recommended that he take his home medication.  Recommended follow-up with his PCP this week.  Return precautions advised.  Patient is to be discharged.  Final Clinical Impressions(s) / ED Diagnoses   Final diagnoses:  Hyperglycemia    ED Discharge Orders    None       Lorelle Gibbs 06/18/17 2236    Sherwood Gambler, MD 06/20/17 [phone]

## 2017-06-18 NOTE — ED Provider Notes (Signed)
[facility] DEPT Provider Note   CSN: [id number] Arrival date & time: 06/18/17  0547     History   Chief Complaint Chief Complaint  Patient presents with  . Hyperglycemia    HPI Mike [name] is a 57 y.o. male.  Presents for evaluation of high blood sugar.  Patient well-known to the ER, has 71 visits in the last 6 months.  He is noncompliant with all treatment plans.     Past Medical History:  Diagnosis Date  . Anxiety   . Anxiety   . Chronic lower back pain   . Depression   . DKA (diabetic ketoacidoses) ([place]) 07/30/2016  . Hyperlipemia   . Hypertension   . Migraine    "last one was ~ 4 yr ago" (12/23/2014)  . Seizures ([place])    "related to pills for anxiety; if I don't take the pills I'm suppose to take I'll have them" (12/23/2014)  . Type II diabetes mellitus [facility])     Patient Active Problem List   Diagnosis Date Noted  . Hyperglycemia 05/26/2017  . Noncompliance 05/26/2017  . AKI (acute kidney injury) ([place]) 03/04/2017  . Malnutrition of moderate degree 02/08/2017  . DKA (diabetic ketoacidoses) ([place]) 02/07/2017  . History of stroke 02/07/2017  . Hyperphosphatemia 12/13/2016  . Evaluation by psychiatric service required 11/30/2016  . Lactic acidosis 11/18/2016  . Normocytic anemia 11/06/2016  . HLD (hyperlipidemia) 11/06/2016  . Adjustment disorder with other symptoms 11/05/2016  . Uncontrolled type 2 diabetes mellitus with hyperosmolar nonketotic hyperglycemia ([place]) 08/24/2016  . Essential hypertension 08/24/2016  . Acute ischemic stroke ([place])   . Protein-calorie malnutrition, severe 12/24/2014  . Homelessness 12/23/2014  . Hypertensive urgency 12/23/2014  . DM (diabetes mellitus) ([place]) 12/23/2014  . Intermittent palpitations 12/23/2014  . Tobacco abuse 12/23/2014  . Pleuritic chest pain 12/23/2014  . Abnormal EKG 12/23/2014  . Type II diabetes mellitus with renal manifestations [facility])     Past Surgical History:    Procedure Laterality Date  . NO PAST SURGERIES          Home Medications    Prior to Admission medications   Medication Sig Start Date End Date Taking? Authorizing Provider  amLODipine (NORVASC) 10 MG tablet Take 1 tablet (10 mg total) by mouth daily. 05/28/17   Dessa Phi, DO  aspirin 325 MG tablet Take 1 tablet (325 mg total) by mouth daily. 03/08/17   Georgette Shell, MD  atorvastatin (LIPITOR) 40 MG tablet Take 1 tablet (40 mg total) by mouth daily. 01/26/17   Cardama, Grayce Sessions, MD  blood glucose meter kit and supplies KIT Dispense based on patient and insurance preference. Use up to four times daily as directed. (FOR ICD-9 250.00, 250.01). 11/22/16   Velvet Bathe, MD  ferrous sulfate 325 (65 FE) MG tablet Take 1 tablet (325 mg total) daily by mouth. Patient taking differently: Take 325 mg by mouth daily as needed (low iron).  11/23/16   Ward, Delice Bison, DO  insulin aspart protamine- aspart (NOVOLOG MIX 70/30) (70-30) 100 UNIT/ML injection Inject 0.3 mLs (30 Units total) into the skin 2 (two) times daily with a meal. 04/30/17   Dorie Rank, MD  lisinopril (PRINIVIL,ZESTRIL) 40 MG tablet Take 1 tablet (40 mg total) by mouth daily. 04/30/17   Dorie Rank, MD  metoprolol tartrate (LOPRESSOR) 25 MG tablet Take 0.5 tablets (12.5 mg total) by mouth 2 (two) times daily. 03/07/17   Georgette Shell, MD    Family History Family History  Problem Relation Age of Onset  . Diabetes Mellitus II Mother   . Diabetes Mellitus II Father     Social History Social History   Tobacco Use  . Smoking status: Current Every Day Smoker    Packs/day: 0.10    Years: 40.00    Pack years: 4.00    Types: Cigarettes  . Smokeless tobacco: Never Used  Substance Use Topics  . Alcohol use: Yes    Alcohol/week: 1.2 oz    Types: 2 Cans of beer per week  . Drug use: No    Comment: 12/23/2014 "stopped ~ 10 yrs ago"     Allergies   Ibuprofen and Tylenol [acetaminophen]   Review of  Systems Review of Systems  Respiratory: Negative for shortness of breath.   Cardiovascular: Negative for chest pain.  All other systems reviewed and are negative.    Physical Exam Updated Vital Signs BP (!) 181/82 (BP Location: Left Arm)   Pulse 68   Temp 98.4 F (36.9 C) (Oral)   Resp 16   SpO2 100%   Physical Exam  Constitutional: He is oriented to person, place, and time. He appears well-developed and well-nourished. No distress.  HENT:  Head: Normocephalic and atraumatic.  Right Ear: Hearing normal.  Left Ear: Hearing normal.  Nose: Nose normal.  Mouth/Throat: Oropharynx is clear and moist and mucous membranes are normal.  Eyes: Pupils are equal, round, and reactive to light. Conjunctivae and EOM are normal.  Neck: Normal range of motion. Neck supple.  Cardiovascular: Regular rhythm, S1 normal and S2 normal. Exam reveals no gallop and no friction rub.  No murmur heard. Pulmonary/Chest: Effort normal and breath sounds normal. No respiratory distress. He exhibits no tenderness.  Abdominal: Soft. Normal appearance and bowel sounds are normal. There is no hepatosplenomegaly. There is no tenderness. There is no rebound, no guarding, no tenderness at McBurney's point and negative Murphy's sign. No hernia.  Musculoskeletal: Normal range of motion.  Neurological: He is alert and oriented to person, place, and time. He has normal strength. No cranial nerve deficit or sensory deficit. Coordination normal. GCS eye subscore is 4. GCS verbal subscore is 5. GCS motor subscore is 6.  Skin: Skin is warm, dry and intact. No rash noted. No cyanosis.  Psychiatric: He has a normal mood and affect. His speech is normal and behavior is normal. Thought content normal.  Nursing note and vitals reviewed.    ED Treatments / Results  Labs (all labs ordered are listed, but only abnormal results are displayed) Labs Reviewed  I-STAT CHEM 8, ED - Abnormal; Notable for the following components:       Result Value   Sodium 132 (*)    Chloride 93 (*)    BUN 22 (*)    Glucose, Bld 536 (*)    Hemoglobin 10.5 (*)    HCT 31.0 (*)    All other components within normal limits    EKG None  Radiology No results found.  Procedures Procedures (including critical care time)  Medications Ordered in ED Medications  insulin aspart (novoLOG) injection 18 Units (has no administration in time range)     Initial Impression / Assessment and Plan / ED Course  I have reviewed the triage vital signs and the nursing notes.  Pertinent labs & imaging results that were available during my care of the patient were reviewed by me and considered in my medical decision making (see chart for details).     Patient well-known to this  ER for frequent visits for hyperglycemia secondary to noncompliance with diet and medications.  I-STAT Chem-8 reveals hyperglycemia without evidence of DKA.  Will treat with insulin, discharge.  Final Clinical Impressions(s) / ED Diagnoses   Final diagnoses:  Hyperglycemia    ED Discharge Orders    None       Orpah Greek, MD 06/18/17 [phone]

## 2017-06-20 ENCOUNTER — Encounter (HOSPITAL_COMMUNITY): Payer: Self-pay | Admitting: Behavioral Health

## 2017-06-20 ENCOUNTER — Emergency Department (HOSPITAL_COMMUNITY)
Admission: EM | Admit: 2017-06-20 | Discharge: 2017-06-20 | Disposition: A | Discharge status: Home / Self Care | Payer: Self-pay | Attending: Physician Assistant | Admitting: Physician Assistant

## 2017-06-20 ENCOUNTER — Encounter (HOSPITAL_COMMUNITY): Payer: Self-pay

## 2017-06-20 ENCOUNTER — Ambulatory Visit (HOSPITAL_COMMUNITY)
Admission: RE | Admit: 2017-06-20 | Discharge: 2017-06-20 | Disposition: A | Discharge status: Home / Self Care | Payer: Self-pay | Attending: Psychiatry | Admitting: Psychiatry

## 2017-06-20 ENCOUNTER — Emergency Department (HOSPITAL_COMMUNITY)
Admission: EM | Admit: 2017-06-20 | Discharge: 2017-06-20 | Disposition: A | Discharge status: Home / Self Care | Payer: Self-pay | Attending: Emergency Medicine | Admitting: Emergency Medicine

## 2017-06-20 ENCOUNTER — Encounter (HOSPITAL_COMMUNITY): Payer: Self-pay | Admitting: Emergency Medicine

## 2017-06-20 DIAGNOSIS — E1165 Type 2 diabetes mellitus with hyperglycemia: Secondary | ICD-10-CM | POA: Insufficient documentation

## 2017-06-20 DIAGNOSIS — Z7982 Long term (current) use of aspirin: Secondary | ICD-10-CM | POA: Insufficient documentation

## 2017-06-20 DIAGNOSIS — R739 Hyperglycemia, unspecified: Secondary | ICD-10-CM

## 2017-06-20 DIAGNOSIS — Z91199 Patient's noncompliance with other medical treatment and regimen due to unspecified reason: Secondary | ICD-10-CM

## 2017-06-20 DIAGNOSIS — Z9119 Patient's noncompliance with other medical treatment and regimen: Secondary | ICD-10-CM | POA: Insufficient documentation

## 2017-06-20 DIAGNOSIS — I10 Essential (primary) hypertension: Secondary | ICD-10-CM | POA: Insufficient documentation

## 2017-06-20 DIAGNOSIS — Z794 Long term (current) use of insulin: Secondary | ICD-10-CM | POA: Insufficient documentation

## 2017-06-20 DIAGNOSIS — F17210 Nicotine dependence, cigarettes, uncomplicated: Secondary | ICD-10-CM | POA: Insufficient documentation

## 2017-06-20 DIAGNOSIS — D649 Anemia, unspecified: Secondary | ICD-10-CM

## 2017-06-20 DIAGNOSIS — Z79899 Other long term (current) drug therapy: Secondary | ICD-10-CM | POA: Insufficient documentation

## 2017-06-20 LAB — CBG MONITORING, ED: Glucose-Capillary: 507 mg/dL (ref 65–99)

## 2017-06-20 LAB — I-STAT CHEM 8, ED
BUN: 19 mg/dL (ref 6–20)
BUN: 29 mg/dL — AB (ref 6–20)
CHLORIDE: 95 mmol/L — AB (ref 101–111)
CHLORIDE: 91 mmol/L — AB (ref 101–111)
Calcium, Ion: 1.09 mmol/L — ABNORMAL LOW (ref 1.15–1.40)
Calcium, Ion: 1.15 mmol/L (ref 1.15–1.40)
Creatinine, Ser: 0.8 mg/dL (ref 0.61–1.24)
Creatinine, Ser: 1 mg/dL (ref 0.61–1.24)
GLUCOSE: 570 mg/dL — AB (ref 65–99)
HCT: 30 % — ABNORMAL LOW (ref 39.0–52.0)
HEMATOCRIT: 27 % — AB (ref 39.0–52.0)
Hemoglobin: 10.2 g/dL — ABNORMAL LOW (ref 13.0–17.0)
Hemoglobin: 9.2 g/dL — ABNORMAL LOW (ref 13.0–17.0)
POTASSIUM: 4.3 mmol/L (ref 3.5–5.1)
POTASSIUM: 4.6 mmol/L (ref 3.5–5.1)
SODIUM: 134 mmol/L — AB (ref 135–145)
SODIUM: 127 mmol/L — AB (ref 135–145)
TCO2: 29 mmol/L (ref 22–32)
TCO2: 27 mmol/L (ref 22–32)

## 2017-06-20 MED ORDER — INSULIN ASPART 100 UNIT/ML ~~LOC~~ SOLN
10.0000 [IU] | Freq: Once | SUBCUTANEOUS | Status: AC
  Administered 2017-06-20: 10 [IU] via SUBCUTANEOUS
  Filled 2017-06-20: qty 1

## 2017-06-20 NOTE — ED Provider Notes (Signed)
[facility] DEPT Provider Note   CSN: [id number] Arrival date & time: 06/20/17  2019     History   Chief Complaint Chief Complaint  Patient presents with  . Hyperglycemia    HPI Mike [name] is a 57 y.o. male with a PMHx of DM2 with known noncompliance, chronic back pain, HTN, HLD, migraines, and other conditions listed below, who presents to the ED with complaints of his blood sugars being high; he then tells the nurse that he's just here for a sandwich and something to drink, and doesn't really need medical evaluation.  Of note, pt is well known to the ED, has had 75 visits in 6 months, most of which are for hyperglycemia; he had a visit here about 11hrs ago, had an Istat chem 8 done about 10hrs ago which showed gluc 570 but no anion gap and was not in DKA therefore he was discharged (no insulin given); he comes in frequently for hyperglycemia, and has been known to eat sugar packets and drink sugary beverages during his stay.  He has a care plan in place, which is outlined below.    Patient states that his blood sugar is high, cannot state exactly how high.  He states that he takes his insulin but he is not sure the name of the medications he takes, states that he last took insulin this morning but cannot tell me exactly what time that was.  He denies having any complaints at this time including denying any recent fevers or chills, CP, SOB, abd pain, N/V/D/C, hematuria, dysuria, polyuria, myalgias, arthralgias, numbness, tingling, focal weakness, or any other complaints at this time.  He states he is just here for a sandwich and something to drink.     CARE PLAN: Background:   -Patient with multiple ED visits in the past 6 months. -Typical visit complaints include hyperglycemia -Patient is known to be noncompliant.     Plan:     >>>>>>R.N. PLEASE DRAW I-STAT 8 WHEN PATIENT ARRIVES<<<<<< -Please have the EDP see the patient as soon as possible and  begin treatment. -If patient presents for hyperglycemia, please exclude any diabetic emergency -If patient not in DKA, please consider giving patient a dose of insulin and then discharge immediately from hospital grounds -If there are no signs of an emergent issue, please discharge patient and advise to follow-up with [facility] for all of his outpatient needs  The history is provided by the patient and medical records. No language interpreter was used.    Past Medical History:  Diagnosis Date  . Anxiety   . Anxiety   . Chronic lower back pain   . Depression   . DKA (diabetic ketoacidoses) ([place]) 07/30/2016  . Hyperlipemia   . Hypertension   . Migraine    "last one was ~ 4 yr ago" (12/23/2014)  . Seizures ([place])    "related to pills for anxiety; if I don't take the pills I'm suppose to take I'll have them" (12/23/2014)  . Type II diabetes mellitus [facility])     Patient Active Problem List   Diagnosis Date Noted  . Hyperglycemia 05/26/2017  . Noncompliance 05/26/2017  . AKI (acute kidney injury) ([place]) 03/04/2017  . Malnutrition of moderate degree 02/08/2017  . DKA (diabetic ketoacidoses) ([place]) 02/07/2017  . History of stroke 02/07/2017  . Hyperphosphatemia 12/13/2016  . Evaluation by psychiatric service required 11/30/2016  . Lactic acidosis 11/18/2016  . Normocytic anemia 11/06/2016  . HLD (hyperlipidemia) 11/06/2016  . Adjustment disorder  with other symptoms 11/05/2016  . Uncontrolled type 2 diabetes mellitus with hyperosmolar nonketotic hyperglycemia ([place]) 08/24/2016  . Essential hypertension 08/24/2016  . Acute ischemic stroke ([place])   . Protein-calorie malnutrition, severe 12/24/2014  . Homelessness 12/23/2014  . Hypertensive urgency 12/23/2014  . DM (diabetes mellitus) ([place]) 12/23/2014  . Intermittent palpitations 12/23/2014  . Tobacco abuse 12/23/2014  . Pleuritic chest pain 12/23/2014  . Abnormal EKG 12/23/2014  . Type II diabetes mellitus with renal manifestations [facility])      Past Surgical History:  Procedure Laterality Date  . NO PAST SURGERIES          Home Medications    Prior to Admission medications   Medication Sig Start Date End Date Taking? Authorizing Provider  amLODipine (NORVASC) 10 MG tablet Take 1 tablet (10 mg total) by mouth daily. 05/28/17   Dessa Phi, DO  aspirin 325 MG tablet Take 1 tablet (325 mg total) by mouth daily. 03/08/17   Georgette Shell, MD  atorvastatin (LIPITOR) 40 MG tablet Take 1 tablet (40 mg total) by mouth daily. 01/26/17   Cardama, Grayce Sessions, MD  blood glucose meter kit and supplies KIT Dispense based on patient and insurance preference. Use up to four times daily as directed. (FOR ICD-9 250.00, 250.01). 11/22/16   Velvet Bathe, MD  ferrous sulfate 325 (65 FE) MG tablet Take 1 tablet (325 mg total) daily by mouth. Patient taking differently: Take 325 mg by mouth daily as needed (low iron).  11/23/16   Ward, Delice Bison, DO  insulin aspart protamine- aspart (NOVOLOG MIX 70/30) (70-30) 100 UNIT/ML injection Inject 0.3 mLs (30 Units total) into the skin 2 (two) times daily with a meal. 04/30/17   Dorie Rank, MD  lisinopril (PRINIVIL,ZESTRIL) 40 MG tablet Take 1 tablet (40 mg total) by mouth daily. 04/30/17   Dorie Rank, MD  metoprolol tartrate (LOPRESSOR) 25 MG tablet Take 0.5 tablets (12.5 mg total) by mouth 2 (two) times daily. 03/07/17   Georgette Shell, MD    Family History Family History  Problem Relation Age of Onset  . Diabetes Mellitus II Mother   . Diabetes Mellitus II Father     Social History Social History   Tobacco Use  . Smoking status: Current Every Day Smoker    Packs/day: 0.10    Years: 40.00    Pack years: 4.00    Types: Cigarettes  . Smokeless tobacco: Never Used  Substance Use Topics  . Alcohol use: Yes    Alcohol/week: 1.2 oz    Types: 2 Cans of beer per week    Comment: No labs available at time of assessment  . Drug use: No    Comment: 12/23/2014 "stopped ~ 10 yrs ago"      Allergies   Ibuprofen and Tylenol [acetaminophen]   Review of Systems Review of Systems  Constitutional: Negative for chills and fever.  Respiratory: Negative for shortness of breath.   Cardiovascular: Negative for chest pain.  Gastrointestinal: Negative for abdominal pain, constipation, diarrhea, nausea and vomiting.  Endocrine: Negative for polyuria.  Genitourinary: Negative for dysuria and hematuria.  Musculoskeletal: Negative for arthralgias and myalgias.  Skin: Negative for color change.  Allergic/Immunologic: Positive for immunocompromised state (DM2).  Neurological: Negative for weakness and numbness.  Psychiatric/Behavioral: Negative for confusion.   All other systems reviewed and are negative for acute change except as noted in the HPI.    Physical Exam Updated Vital Signs BP (!) 176/88 (BP Location: Right Arm)   Pulse  84   Temp 98.1 F (36.7 C) (Oral)   Resp 16   SpO2 98%   Physical Exam  Constitutional: He is oriented to person, place, and time. Vital signs are normal. He appears well-developed and well-nourished.  Non-toxic appearance. No distress.  Afebrile, nontoxic, NAD, poor hygiene; HTN noted which is similar to all prior visits  HENT:  Head: Normocephalic and atraumatic.  Mouth/Throat: Oropharynx is clear and moist and mucous membranes are normal.  Poor dentitia  Eyes: Conjunctivae and EOM are normal. Right eye exhibits no discharge. Left eye exhibits no discharge.  Neck: Normal range of motion. Neck supple.  Cardiovascular: Normal rate, regular rhythm, normal heart sounds and intact distal pulses. Exam reveals no gallop and no friction rub.  No murmur heard. Pulmonary/Chest: Effort normal and breath sounds normal. No respiratory distress. He has no decreased breath sounds. He has no wheezes. He has no rhonchi. He has no rales.  Abdominal: Soft. Normal appearance and bowel sounds are normal. He exhibits no distension. There is no tenderness. There is  no rigidity, no rebound, no guarding, no CVA tenderness, no tenderness at McBurney's point and negative Murphy's sign.  Musculoskeletal: Normal range of motion.  Neurological: He is alert and oriented to person, place, and time. He has normal strength. No sensory deficit.  Skin: Skin is warm, dry and intact. No rash noted.  Psychiatric: He has a normal mood and affect.  Nursing note and vitals reviewed.    ED Treatments / Results  Labs (all labs ordered are listed, but only abnormal results are displayed) Labs Reviewed  I-STAT CHEM 8, ED - Abnormal; Notable for the following components:      Result Value   Sodium 127 (*)    Chloride 91 (*)    BUN 29 (*)    Glucose, Bld >700 (*)    Hemoglobin 9.2 (*)    HCT 27.0 (*)    All other components within normal limits  CBG MONITORING, ED - Abnormal; Notable for the following components:   Glucose-Capillary >600 (*)    All other components within normal limits    EKG None  Radiology No results found.  Procedures Procedures (including critical care time)  Medications Ordered in ED Medications  insulin aspart (novoLOG) injection 10 Units (10 Units Subcutaneous Given 06/20/17 2103)     Initial Impression / Assessment and Plan / ED Course  I have reviewed the triage vital signs and the nursing notes.  Pertinent labs & imaging results that were available during my care of the patient were reviewed by me and considered in my medical decision making (see chart for details).     57 y.o. male here with hyperglycemia again, this is his second visit in 11hrs; says he wants a sandwich and something to drink. Chem 8 earlier showed gluc 570 but no evidence of DKA. Denies any complaints currently. Exam benign aside from poor hygiene, also with HTN but this is the same as all prior visits. Per care plan, will obtain chem 8 to ensure no DKA, give subQ insulin, and if no emergent condition exists then we will promptly discharge him.   9:14  PM Initial CBG >600; Chem 8 with gluc >700 (before insulin given) but otherwise stable values (Hgb 9.2 but he appears to range from 8-10 range), and no elevated anion gap (calculated to be 9, on chem 8 states it's 15, both of which are WNL) or low bicarb. Does not appear to be in DKA. SubQ insulin  given. Does not appear to have an emergent medical condition at this time, doubt need for further emergent work up at this time. Advised that he needs to f/up with the [facility] for ongoing management of his medical conditions, and reiterated importance of compliance with his medication regimen as well as diet/lifestyle for diabetes. Discussed inappropriate use of EMS system. F/up with [facility] tomorrow. I explained the diagnosis and have given explicit precautions to return to the ER including for any other new or worsening symptoms. The patient understands and accepts the medical plan as it's been dictated and I have answered their questions. Discharge instructions concerning home care and prescriptions have been given. The patient is STABLE and is discharged to home in good condition.    Final Clinical Impressions(s) / ED Diagnoses   Final diagnoses:  Type 2 diabetes mellitus with hyperglycemia, unspecified whether long term insulin use ([place])  Noncompliance with diabetes treatment  Chronic anemia    ED Discharge Orders    [address], [place], [place] 06/20/17 2114    Isla Pence, MD 06/20/17 2120

## 2017-06-20 NOTE — BH Assessment (Signed)
Assessment Note  Mike [name] is a61 y.o. male who presented to [facility] as a voluntary walk-in in a state of apparent confusion.  Pt was last assessed by TTS in October 2018.  At that time, Pt presented with complaint of elevated blood sugar.  He was also in a state of confusion.  Pt provided history.  He stated that he is homeless and only works intermittently.  Pt was disheveled.  On his intake form, Pt endorsed auditory and visual hallucinations.  He also reported having a psychiatrist.  During assessment, Pt endorsed hallucination and having a psychiatrist.  He reported that he came to [facility] because of elevated blood sugar.  Pt asked for something to eat and coffee.  Pt denied suicidal ideation, homicidal ideation, self-injurious behavior, and substance use.  He endorsed some despondency.  Identified stressors are homelessness (for at least six years) and unmanaged diabetes.  Pt denied any local family support.  During assessment, Pt presented as alert and oriented to place and situation (not oriented to time).  Pt was dressed in street clothes and appeared disheveled.  Pt had good eye contact.  Pt's mood and affect were preoccupied.  Pt denied suicidal ideation, homicidal ideation, hallucination, self-injurious behavior, and substance use.  Speech was slow, mumbled, and difficult to comprehend.  Pt's thought processes were slow.  Memory and concentration were fair.  Pt's insight, judgment, and impulse control were deemed fair.  Consulted with S. Rankin, NP, who determined that Pt is to be medically cleared, stabilized, and observed.  Contacted [facility] to advise of transport.  [facility] transported.  Diagnosis:  33.2 MDD, Moderate  Past Medical History:  Past Medical History:  Diagnosis Date  . Anxiety   . Anxiety   . Chronic lower back pain   . Depression   . DKA (diabetic ketoacidoses) (HCC) 07/30/2016  . Hyperlipemia   . Hypertension   . Migraine    "last one was ~ 4 yr ago" (12/23/2014)   . Seizures (HCC)    "related to pills for anxiety; if I don't take the pills I'm suppose to take I'll have them" (12/23/2014)  . Type II diabetes mellitus (HCC)     Past Surgical History:  Procedure Laterality Date  . NO PAST SURGERIES      Family History:  Family History  Problem Relation Age of Onset  . Diabetes Mellitus II Mother   . Diabetes Mellitus II Father     Social History:  reports that he has been smoking cigarettes.  He has a 4.00 pack-year smoking history. He has never used smokeless tobacco. He reports that he drinks about 1.2 oz of alcohol per week. He reports that he does not use drugs.  Additional Social History:  Alcohol / Drug Use Pain Medications: See MAR Prescriptions: See MAR Over the Counter: See MAR History of alcohol / drug use?: No history of alcohol / drug abuse  CIWA: CIWA-Ar BP: (!) 167/86 Pulse Rate: 61 COWS:    Allergies:  Allergies  Allergen Reactions  . Ibuprofen Nausea And Vomiting and Other (See Comments)    Makes the patient feel bloated also  . Tylenol [Acetaminophen] Nausea And Vomiting and Other (See Comments)    Makes the patient feel bloated also    Home Medications:  (Not in a hospital admission)  OB/GYN Status:  No LMP for male patient.  General Assessment Data Is this a Tele or Face-to-Face Assessment?: Face-to-Face Is this an Initial Assessment or a Re-assessment for this  encounter?: Initial Assessment Marital status: Single Is patient pregnant?: No Pregnancy Status: No Living Arrangements: Other (Comment)(homeless) Can pt return to current living arrangement?: Yes Admission Status: Voluntary Is patient capable of signing voluntary admission?: Yes Referral Source: Self/Family/Friend Insurance type: None     Crisis Care Plan Living Arrangements: Other (Comment)(homeless) Name of Psychiatrist: None Name of Therapist: None  Education Status Is patient currently in school?: No Is the patient employed,  unemployed or receiving disability?: Unemployed  Risk to self with the past 6 months Suicidal Ideation: No Has patient been a risk to self within the past 6 months prior to admission? : No Suicidal Intent: No Has patient had any suicidal intent within the past 6 months prior to admission? : No Is patient at risk for suicide?: No Suicidal Plan?: No Has patient had any suicidal plan within the past 6 months prior to admission? : No Access to Means: No What has been your use of drugs/alcohol within the last 12 months?: (Pt denied) Previous Attempts/Gestures: No Intentional Self Injurious Behavior: None Family Suicide History: No Recent stressful life event(s): Financial Problems, Other (Comment)(Homeless; diabetic) Persecutory voices/beliefs?: No Depression: Yes Depression Symptoms: Despondent Substance abuse history and/or treatment for substance abuse?: No Suicide prevention information given to non-admitted patients: Not applicable  Risk to Others within the past 6 months Homicidal Ideation: No Does patient have any lifetime risk of violence toward others beyond the six months prior to admission? : No Thoughts of Harm to Others: No Current Homicidal Intent: No Current Homicidal Plan: No Access to Homicidal Means: No History of harm to others?: No Assessment of Violence: None Noted Does patient have access to weapons?: No Criminal Charges Pending?: Yes Describe Pending Criminal Charges: trespass Does patient have a court date: Yes Court Date: 06/25/17 Is patient on probation?: No  Psychosis Hallucinations: None noted Delusions: None noted  Mental Status Report Appearance/Hygiene: In scrubs, Unremarkable Eye Contact: Fair Motor Activity: Freedom of movement, Unremarkable Speech: Logical/coherent Level of Consciousness: Alert Mood: Preoccupied Affect: Appropriate to circumstance Anxiety Level: None Thought Processes: Coherent, Relevant Judgement: Partial Orientation:  Person, Place, Situation Obsessive Compulsive Thoughts/Behaviors: None  Cognitive Functioning Concentration: Normal Memory: Recent Intact, Remote Intact Is patient IDD: No Is patient DD?: No Insight: Fair Impulse Control: Fair Appetite: Fair Have you had any weight changes? : No Change Sleep: No Change Vegetative Symptoms: None  ADLScreening [facility] Assessment Services) Patient's cognitive ability adequate to safely complete daily activities?: Yes Patient able to express need for assistance with ADLs?: Yes Independently performs ADLs?: Yes (appropriate for developmental age)  Prior Inpatient Therapy Prior Inpatient Therapy: (unknown)  Prior Outpatient Therapy Prior Outpatient Therapy: (unknown)  ADL Screening (condition at time of admission) Patient's cognitive ability adequate to safely complete daily activities?: Yes Is the patient deaf or have difficulty hearing?: No Does the patient have difficulty seeing, even when wearing glasses/contacts?: No Does the patient have difficulty concentrating, remembering, or making decisions?: No Patient able to express need for assistance with ADLs?: Yes Does the patient have difficulty dressing or bathing?: No Independently performs ADLs?: Yes (appropriate for developmental age) Does the patient have difficulty walking or climbing stairs?: No Weakness of Legs: None Weakness of Arms/Hands: None  Home Assistive Devices/Equipment Home Assistive Devices/Equipment: None  Therapy Consults (therapy consults require a physician order) PT Evaluation Needed: No OT Evalulation Needed: No Abuse/Neglect Assessment (Assessment to be complete while patient is alone) Abuse/Neglect Assessment Can Be Completed: Yes Values / Beliefs Cultural Requests During Hospitalization: None Spiritual Requests  During Hospitalization: None Consults Spiritual Care Consult Needed: No Social Work Consult Needed: No Merchant navy officerAdvance Directives (For Healthcare) Does Patient  Have a Medical Advance Directive?: No    Additional Information 1:1 In Past 12 Months?: No CIRT Risk: No Elopement Risk: No Does patient have medical clearance?: No([facility])     Disposition:  Disposition Initial Assessment Completed for this Encounter: Yes Disposition of Patient[facility](Per S. Rankin, NP [facility]) Patient referred to: (ED for stabilization, obs)  On Site Evaluation by:   Reviewed with Physician:    Dorris FetchEugene T Jaidee Stipe 06/20/2017 8:54 AM

## 2017-06-20 NOTE — ED Notes (Signed)
Eating

## 2017-06-20 NOTE — ED Provider Notes (Signed)
[place] DEPT Provider Note   CSN: [id number] Arrival date & time: 06/20/17  0908     History   Chief Complaint Chief Complaint  Patient presents with  . Hyperglycemia    HPI Mike [name] is a 57 y.o. male.  HPI   Patient is a 57 year old male whose had a 67 visits in the last 6 months the emergency department.  He is here today with hyperglycemia.  Patient denies any SI HI or audiovisual visual hallucinations to me.  He reports he is just here for sandwich.  Past Medical History:  Diagnosis Date  . Anxiety   . Anxiety   . Chronic lower back pain   . Depression   . DKA (diabetic ketoacidoses) ([place]) 07/30/2016  . Hyperlipemia   . Hypertension   . Migraine    "last one was ~ 4 yr ago" (12/23/2014)  . Seizures ([place])    "related to pills for anxiety; if I don't take the pills I'm suppose to take I'll have them" (12/23/2014)  . Type II diabetes mellitus [facility])     Patient Active Problem List   Diagnosis Date Noted  . Hyperglycemia 05/26/2017  . Noncompliance 05/26/2017  . AKI (acute kidney injury) ([place]) 03/04/2017  . Malnutrition of moderate degree 02/08/2017  . DKA (diabetic ketoacidoses) ([place]) 02/07/2017  . History of stroke 02/07/2017  . Hyperphosphatemia 12/13/2016  . Evaluation by psychiatric service required 11/30/2016  . Lactic acidosis 11/18/2016  . Normocytic anemia 11/06/2016  . HLD (hyperlipidemia) 11/06/2016  . Adjustment disorder with other symptoms 11/05/2016  . Uncontrolled type 2 diabetes mellitus with hyperosmolar nonketotic hyperglycemia ([place]) 08/24/2016  . Essential hypertension 08/24/2016  . Acute ischemic stroke ([place])   . Protein-calorie malnutrition, severe 12/24/2014  . Homelessness 12/23/2014  . Hypertensive urgency 12/23/2014  . DM (diabetes mellitus) ([place]) 12/23/2014  . Intermittent palpitations 12/23/2014  . Tobacco abuse 12/23/2014  . Pleuritic chest pain 12/23/2014  . Abnormal EKG 12/23/2014  .  Type II diabetes mellitus with renal manifestations [facility])     Past Surgical History:  Procedure Laterality Date  . NO PAST SURGERIES          Home Medications    Prior to Admission medications   Medication Sig Start Date End Date Taking? Authorizing Provider  amLODipine (NORVASC) 10 MG tablet Take 1 tablet (10 mg total) by mouth daily. 05/28/17   Dessa Phi, DO  aspirin 325 MG tablet Take 1 tablet (325 mg total) by mouth daily. 03/08/17   Georgette Shell, MD  atorvastatin (LIPITOR) 40 MG tablet Take 1 tablet (40 mg total) by mouth daily. 01/26/17   Cardama, Grayce Sessions, MD  blood glucose meter kit and supplies KIT Dispense based on patient and insurance preference. Use up to four times daily as directed. (FOR ICD-9 250.00, 250.01). 11/22/16   Velvet Bathe, MD  ferrous sulfate 325 (65 FE) MG tablet Take 1 tablet (325 mg total) daily by mouth. Patient taking differently: Take 325 mg by mouth daily as needed (low iron).  11/23/16   Ward, Delice Bison, DO  insulin aspart protamine- aspart (NOVOLOG MIX 70/30) (70-30) 100 UNIT/ML injection Inject 0.3 mLs (30 Units total) into the skin 2 (two) times daily with a meal. 04/30/17   Dorie Rank, MD  lisinopril (PRINIVIL,ZESTRIL) 40 MG tablet Take 1 tablet (40 mg total) by mouth daily. 04/30/17   Dorie Rank, MD  metoprolol tartrate (LOPRESSOR) 25 MG tablet Take 0.5 tablets (12.5 mg total) by mouth 2 (two)  times daily. 03/07/17   Georgette Shell, MD    Family History Family History  Problem Relation Age of Onset  . Diabetes Mellitus II Mother   . Diabetes Mellitus II Father     Social History Social History   Tobacco Use  . Smoking status: Current Every Day Smoker    Packs/day: 0.10    Years: 40.00    Pack years: 4.00    Types: Cigarettes  . Smokeless tobacco: Never Used  Substance Use Topics  . Alcohol use: Yes    Alcohol/week: 1.2 oz    Types: 2 Cans of beer per week    Comment: No labs available at time of assessment  .  Drug use: No    Comment: 12/23/2014 "stopped ~ 10 yrs ago"     Allergies   Ibuprofen and Tylenol [acetaminophen]   Review of Systems Review of Systems  Constitutional: Negative for activity change.  Respiratory: Negative for shortness of breath.   Cardiovascular: Negative for chest pain.  Gastrointestinal: Negative for abdominal pain.     Physical Exam Updated Vital Signs BP (!) 153/72 (BP Location: Right Arm)   Pulse 72   Temp 98 F (36.7 C) (Oral)   Resp 18   SpO2 100%   Physical Exam  Constitutional: He is oriented to person, place, and time. He appears well-nourished.  Unkempt, maladorous  HENT:  Head: Normocephalic.  Eyes: Conjunctivae are normal.  Cardiovascular: Normal rate and regular rhythm.  Pulmonary/Chest: Effort normal and breath sounds normal. No respiratory distress.  Neurological: He is oriented to person, place, and time.  Skin: Skin is warm and dry. He is not diaphoretic.  Psychiatric: He has a normal mood and affect.  Denies SI HI, AVH     ED Treatments / Results  Labs (all labs ordered are listed, but only abnormal results are displayed) Labs Reviewed  CBG MONITORING, ED - Abnormal; Notable for the following components:      Result Value   Glucose-Capillary 507 (*)    All other components within normal limits  I-STAT CHEM 8, ED - Abnormal; Notable for the following components:   Sodium 134 (*)    Chloride 95 (*)    Glucose, Bld 570 (*)    Calcium, Ion 1.09 (*)    Hemoglobin 10.2 (*)    HCT 30.0 (*)    All other components within normal limits    EKG None  Radiology No results found.  Procedures Procedures (including critical care time)  Medications Ordered in ED Medications - No data to display   Initial Impression / Assessment and Plan / ED Course  I have reviewed the triage vital signs and the nursing notes.  Pertinent labs & imaging results that were available during my care of the patient were reviewed by me and  considered in my medical decision making (see chart for details).     Patient is a 57 year old male whose had a 33 visits in the last 6 months the emergency department.  He is here today with hyperglycemia.  Patient denies any SI HI or audiovisual visual hallucinations to me.  He reports he is just here for sandwich.  11:17 AM Patient's i-STAT Chem-8 shows no evidence of anion gap.  Patient should be able to continue to monitor glucose at home.  Will encourage him to take his home medications.  Final Clinical Impressions(s) / ED Diagnoses   Final diagnoses:  Hyperglycemia    ED Discharge Orders    None  Macarthur Critchley, MD 06/20/17 1118

## 2017-06-20 NOTE — Discharge Instructions (Addendum)
Please return if you have any thoughts of hurting yourself or others.  Our if you experience any auditory or visual hallucinations.  We encourage you as always to take your medicines at home.

## 2017-06-20 NOTE — ED Notes (Signed)
Denies SI, HI, Hallucinations

## 2017-06-20 NOTE — H&P (Signed)
Behavioral Health Medical Screening Exam  Mike [name] is an 57 y.o. male patient presents to [facility] with complaints that he is having problems with his blood sugar and that he is getting worse.  Patient denied suicidal/self-harm/homicidal ideation, psychosis, and paranoia.  Patient oriented to name but stated that his date of birth was [birth year].  Patient stated that he is living [place] hotel [place].  Patient is not a good historian related to confusion.      Total Time spent with patient: 30 minutes  Psychiatric Specialty Exam: Physical Exam  Vitals reviewed. Constitutional:  Patient disheveled, very odorous, clothing dirty  Patient given a meal tray and appears to be doing slightly better but still confused.   Neck: Normal range of motion.  Respiratory: Effort normal.  Musculoskeletal: Normal range of motion.  Neurological: He is alert.  Patient is alert but very confused  Skin:  Skin very soiled and very strong body odor     Review of Systems  Unable to perform ROS: Acuity of condition  Cardiovascular:       Reports that he is having problems with his blood pressure  Endo/Heme/Allergies:       Reports that he is having problems with his blood sugar.  Patient given a meal tray and appears to be doing slightly better; but still confused  Psychiatric/Behavioral: Positive for memory loss (Confusion). Depression: Denies. Hallucinations: Denies. Substance abuse: Patient denies drug use. Suicidal ideas: Denies. Nervous/anxious: Denies.     Blood pressure (!) 167/86, pulse 61, resp. rate 18, SpO2 100 %.There is no height or weight on file to calculate BMI.  General Appearance: Disheveled and odorus  Eye Contact:  Good  Speech:  Slow  Volume:  Decreased  Mood:  confused  Affect:  Flat  Thought Process:  Disorganized  Orientation:  Other:  oriented to person; knows he is at a hospital but not which one  Thought Content:  Unable to obtain this information at this time related to  confusion  Suicidal Thoughts:  No  Homicidal Thoughts:  No  Memory:  Immediate;   Poor Recent;   Poor Remote;   Poor  Judgement:  Other:  Unable to obtain this information at this time related to confusion  Insight:  Unable to obtain this information at this time related to confusion   Psychomotor Activity:  Decreased  Concentration: Concentration: Poor and Attention Span: Poor  Recall:  Poor  Fund of Knowledge:Unable to obtain this information at this time related to confusion  Language: Fair  Akathisia:  No  Handed:  Right  AIMS (if indicated):     Assets:  Others:  Unable to obtain this information at this time related to confusion  Sleep:       Musculoskeletal: Strength & Muscle Tone: within normal limits Gait & Station: normal Patient leans: N/A  Blood pressure (!) 167/86, pulse 61, resp. rate 18, SpO2 100 %.  Recommendations:  Patient to be sent to ED for observation,  Stabilization, and medical clearance.  Will reassess when medically cleared  Based on my evaluation the patient appears to have an emergency medical condition for which I recommend the patient be transferred to the emergency department for further evaluation.  Mike Rankin, NP 06/20/2017, 8:49 AM

## 2017-06-20 NOTE — Discharge Instructions (Signed)
It's important that you take your insulin and home medications exactly how you're prescribed. Calling 911 to get a sandwich or something to drink is NOT an appropriate use of the emergency medical services system. You must follow up with your [facility] for ongoing management of your medical conditions. Return to the ER for emergent medical conditions, or worsening or changing symptoms.

## 2017-06-20 NOTE — ED Triage Notes (Signed)
Pt complains of high blood sugar today, EMS states his CBG reads high on their meter

## 2017-06-20 NOTE — ED Triage Notes (Signed)
[facility] with complaints of hyperglycemia. When asked what brings him in he stated "diabetes". Denies SI, HI. Not sure [facility].

## 2017-06-20 NOTE — ED Notes (Signed)
Bed: [facility]PT4 Expected date:  Expected time:  Means of arrival:  Comments: 

## 2017-06-20 NOTE — ED Notes (Signed)
Writer notified PA Mercedes of abnormal I stat chem 8 result.

## 2017-06-20 NOTE — ED Notes (Addendum)
Per chart review, [facility] C/o AVH and hyperglycemia.  [facility] for medical [facility] and observation.

## 2017-06-21 ENCOUNTER — Encounter (HOSPITAL_COMMUNITY): Payer: Self-pay

## 2017-06-21 ENCOUNTER — Emergency Department (HOSPITAL_COMMUNITY)
Admission: EM | Admit: 2017-06-21 | Discharge: 2017-06-21 | Disposition: A | Discharge status: Home / Self Care | Payer: Self-pay | Attending: Emergency Medicine | Admitting: Emergency Medicine

## 2017-06-21 ENCOUNTER — Emergency Department (HOSPITAL_COMMUNITY)
Admission: EM | Admit: 2017-06-21 | Discharge: 2017-06-22 | Disposition: A | Discharge status: Home / Self Care | Payer: Self-pay | Attending: Emergency Medicine | Admitting: Emergency Medicine

## 2017-06-21 ENCOUNTER — Other Ambulatory Visit: Payer: Self-pay

## 2017-06-21 ENCOUNTER — Encounter (HOSPITAL_COMMUNITY): Payer: Self-pay | Admitting: Emergency Medicine

## 2017-06-21 DIAGNOSIS — E1165 Type 2 diabetes mellitus with hyperglycemia: Secondary | ICD-10-CM | POA: Insufficient documentation

## 2017-06-21 DIAGNOSIS — Z8673 Personal history of transient ischemic attack (TIA), and cerebral infarction without residual deficits: Secondary | ICD-10-CM | POA: Insufficient documentation

## 2017-06-21 DIAGNOSIS — R739 Hyperglycemia, unspecified: Secondary | ICD-10-CM

## 2017-06-21 DIAGNOSIS — Z590 Homelessness: Secondary | ICD-10-CM | POA: Insufficient documentation

## 2017-06-21 DIAGNOSIS — Z79899 Other long term (current) drug therapy: Secondary | ICD-10-CM | POA: Insufficient documentation

## 2017-06-21 DIAGNOSIS — I10 Essential (primary) hypertension: Secondary | ICD-10-CM | POA: Insufficient documentation

## 2017-06-21 DIAGNOSIS — Z7982 Long term (current) use of aspirin: Secondary | ICD-10-CM | POA: Insufficient documentation

## 2017-06-21 DIAGNOSIS — F17210 Nicotine dependence, cigarettes, uncomplicated: Secondary | ICD-10-CM | POA: Insufficient documentation

## 2017-06-21 DIAGNOSIS — Z794 Long term (current) use of insulin: Secondary | ICD-10-CM | POA: Insufficient documentation

## 2017-06-21 DIAGNOSIS — E785 Hyperlipidemia, unspecified: Secondary | ICD-10-CM | POA: Insufficient documentation

## 2017-06-21 LAB — I-STAT CHEM 8, ED
BUN: 18 mg/dL (ref 6–20)
CALCIUM ION: 1.21 mmol/L (ref 1.15–1.40)
CREATININE: 0.9 mg/dL (ref 0.61–1.24)
Chloride: 94 mmol/L — ABNORMAL LOW (ref 101–111)
HCT: 29 % — ABNORMAL LOW (ref 39.0–52.0)
HEMOGLOBIN: 9.9 g/dL — AB (ref 13.0–17.0)
Potassium: 4.6 mmol/L (ref 3.5–5.1)
Sodium: 130 mmol/L — ABNORMAL LOW (ref 135–145)
TCO2: 26 mmol/L (ref 22–32)

## 2017-06-21 LAB — CBG MONITORING, ED: Glucose-Capillary: >600 mg/dL (ref 65–99)

## 2017-06-21 MED ORDER — INSULIN ASPART 100 UNIT/ML ~~LOC~~ SOLN
15.0000 [IU] | Freq: Once | SUBCUTANEOUS | Status: AC
  Administered 2017-06-22: 15 [IU] via SUBCUTANEOUS
  Filled 2017-06-21: qty 1

## 2017-06-21 MED ORDER — INSULIN ASPART 100 UNIT/ML ~~LOC~~ SOLN: 15.0000 [IU] | Freq: Once | SUBCUTANEOUS | Status: DC

## 2017-06-21 NOTE — ED Provider Notes (Signed)
[facility] DEPT Provider Note   CSN: [id number] Arrival date & time: 06/21/17  2327     History   Chief Complaint Chief Complaint  Patient presents with  . Hyperglycemia    HPI Mike [name] is a 57 y.o. male.  57 yo M with a chief complaint of high blood sugar.  The patient was just discharged about an hour and a half ago.  Since then he has been in the waiting room eating out of the vending machine.  He thinks that his sugar is still high.  He denies any other complaint.  The history is provided by the patient.  Hyperglycemia  Associated symptoms: no abdominal pain, no chest pain, no confusion, no fever, no shortness of breath and no vomiting   Illness  This is a chronic problem. The current episode started 1 to 2 hours ago. The problem occurs constantly. The problem has not changed since onset.Pertinent negatives include no chest pain, no abdominal pain, no headaches and no shortness of breath. Nothing aggravates the symptoms. Nothing relieves the symptoms. He has tried nothing for the symptoms. The treatment provided no relief.    Past Medical History:  Diagnosis Date  . Anxiety   . Anxiety   . Chronic lower back pain   . Depression   . DKA (diabetic ketoacidoses) ([place]) 07/30/2016  . Hyperlipemia   . Hypertension   . Migraine    "last one was ~ 4 yr ago" (12/23/2014)  . Seizures ([place])    "related to pills for anxiety; if I don't take the pills I'm suppose to take I'll have them" (12/23/2014)  . Type II diabetes mellitus [facility])     Patient Active Problem List   Diagnosis Date Noted  . Hyperglycemia 05/26/2017  . Noncompliance 05/26/2017  . AKI (acute kidney injury) ([place]) 03/04/2017  . Malnutrition of moderate degree 02/08/2017  . DKA (diabetic ketoacidoses) ([place]) 02/07/2017  . History of stroke 02/07/2017  . Hyperphosphatemia 12/13/2016  . Evaluation by psychiatric service required 11/30/2016  . Lactic acidosis 11/18/2016  .  Normocytic anemia 11/06/2016  . HLD (hyperlipidemia) 11/06/2016  . Adjustment disorder with other symptoms 11/05/2016  . Uncontrolled type 2 diabetes mellitus with hyperosmolar nonketotic hyperglycemia ([place]) 08/24/2016  . Essential hypertension 08/24/2016  . Acute ischemic stroke ([place])   . Protein-calorie malnutrition, severe 12/24/2014  . Homelessness 12/23/2014  . Hypertensive urgency 12/23/2014  . DM (diabetes mellitus) ([place]) 12/23/2014  . Intermittent palpitations 12/23/2014  . Tobacco abuse 12/23/2014  . Pleuritic chest pain 12/23/2014  . Abnormal EKG 12/23/2014  . Type II diabetes mellitus with renal manifestations [facility])     Past Surgical History:  Procedure Laterality Date  . NO PAST SURGERIES          Home Medications    Prior to Admission medications   Medication Sig Start Date End Date Taking? Authorizing Provider  amLODipine (NORVASC) 10 MG tablet Take 1 tablet (10 mg total) by mouth daily. 05/28/17   Dessa Phi, DO  aspirin 325 MG tablet Take 1 tablet (325 mg total) by mouth daily. 03/08/17   Georgette Shell, MD  atorvastatin (LIPITOR) 40 MG tablet Take 1 tablet (40 mg total) by mouth daily. 01/26/17   Cardama, Grayce Sessions, MD  blood glucose meter kit and supplies KIT Dispense based on patient and insurance preference. Use up to four times daily as directed. (FOR ICD-9 250.00, 250.01). 11/22/16   Velvet Bathe, MD  ferrous sulfate 325 (65 FE) MG tablet  Take 1 tablet (325 mg total) daily by mouth. Patient taking differently: Take 325 mg by mouth daily as needed (low iron).  11/23/16   Ward, Delice Bison, DO  insulin aspart protamine- aspart (NOVOLOG MIX 70/30) (70-30) 100 UNIT/ML injection Inject 0.3 mLs (30 Units total) into the skin 2 (two) times daily with a meal. 04/30/17   Dorie Rank, MD  lisinopril (PRINIVIL,ZESTRIL) 40 MG tablet Take 1 tablet (40 mg total) by mouth daily. 04/30/17   Dorie Rank, MD  metoprolol tartrate (LOPRESSOR) 25 MG tablet Take 0.5 tablets  (12.5 mg total) by mouth 2 (two) times daily. 03/07/17   Georgette Shell, MD    Family History Family History  Problem Relation Age of Onset  . Diabetes Mellitus II Mother   . Diabetes Mellitus II Father     Social History Social History   Tobacco Use  . Smoking status: Current Every Day Smoker    Packs/day: 0.10    Years: 40.00    Pack years: 4.00    Types: Cigarettes  . Smokeless tobacco: Never Used  Substance Use Topics  . Alcohol use: Yes    Alcohol/week: 1.2 oz    Types: 2 Cans of beer per week    Comment: No labs available at time of assessment  . Drug use: No    Comment: 12/23/2014 "stopped ~ 10 yrs ago"     Allergies   Ibuprofen and Tylenol [acetaminophen]   Review of Systems Review of Systems  Constitutional: Negative for chills and fever.  HENT: Negative for congestion and facial swelling.   Eyes: Negative for discharge and visual disturbance.  Respiratory: Negative for shortness of breath.   Cardiovascular: Negative for chest pain and palpitations.  Gastrointestinal: Negative for abdominal pain, diarrhea and vomiting.  Musculoskeletal: Negative for arthralgias and myalgias.  Skin: Negative for color change and rash.  Neurological: Negative for tremors, syncope and headaches.  Psychiatric/Behavioral: Negative for confusion and dysphoric mood.     Physical Exam Updated Vital Signs There were no vitals taken for this visit.  Physical Exam  Constitutional: He is oriented to person, place, and time. He appears well-developed and well-nourished.  HENT:  Head: Normocephalic and atraumatic.  Eyes: Pupils are equal, round, and reactive to light. EOM are normal.  Neck: Normal range of motion. Neck supple. No JVD present.  Cardiovascular: Normal rate and regular rhythm. Exam reveals no gallop and no friction rub.  No murmur heard. Pulmonary/Chest: No respiratory distress. He has no wheezes.  Abdominal: He exhibits no distension. There is no rebound and  no guarding.  Musculoskeletal: Normal range of motion.  Neurological: He is alert and oriented to person, place, and time.  Skin: No rash noted. No pallor.  Psychiatric: He has a normal mood and affect. His behavior is normal.  Nursing note and vitals reviewed.    ED Treatments / Results  Labs (all labs ordered are listed, but only abnormal results are displayed) Labs Reviewed  CBG MONITORING, ED - Abnormal; Notable for the following components:      Result Value   Glucose-Capillary >600 (*)    All other components within normal limits    EKG None  Radiology No results found.  Procedures Procedures (including critical care time)  Medications Ordered in ED Medications  insulin aspart (novoLOG) injection 15 Units (has no administration in time range)     Initial Impression / Assessment and Plan / ED Course  I have reviewed the triage vital signs and the nursing  notes.  Pertinent labs & imaging results that were available during my care of the patient were reviewed by me and considered in my medical decision making (see chart for details).     57 yo M with a chief complaint of hyperglycemia.  The patient's blood sugar is still greater than 600.  I will give a dose of subcu insulin.  I feel that it is extremely unlikely that he developed metabolic acidosis and an hour and a half.  I do not feel that rechecking labs will be beneficial.  I will discharge the patient home.  11:43 PM:  I have discussed the diagnosis/risks/treatment options with the patient and family and believe the pt to be eligible for discharge home to follow-up with PCP. We also discussed returning to the ED immediately if new or worsening sx occur. We discussed the sx which are most concerning (e.g., sudden worsening pain, fever, inability to tolerate by mouth) that necessitate immediate return. Medications administered to the patient during their visit and any new prescriptions provided to the patient are  listed below.  Medications given during this visit Medications  insulin aspart (novoLOG) injection 15 Units (has no administration in time range)     The patient appears reasonably screen and/or stabilized for discharge and I doubt any other medical condition or other [facility] requiring further screening, evaluation, or treatment in the ED at this time prior to discharge.    Final Clinical Impressions(s) / ED Diagnoses   Final diagnoses:  Hyperglycemia    ED Discharge Orders    None       Deno Etienne, DO 06/21/17 2343

## 2017-06-21 NOTE — ED Triage Notes (Signed)
Pt complaining of high blood sugar. Reading "HI" with [facility]. Also reports HTN.

## 2017-06-21 NOTE — ED Triage Notes (Signed)
Pt states he is here for his sugar being high. Pt refused to come to treatment room at first call because he was getting snacks from snack machine.

## 2017-06-21 NOTE — ED Notes (Signed)
Bed: WLPT3 Expected date:  Expected time:  Means of arrival:  Comments: 

## 2017-06-21 NOTE — ED Notes (Signed)
Bed: [facility]3 Expected date:  Expected time:  Means of arrival:  Comments: 57 yo hyperglycemia

## 2017-06-21 NOTE — ED Notes (Signed)
Pt. I-stat Chem 8 results glucose>than 700, EDP,Floyd,MD. Made aware.

## 2017-06-21 NOTE — ED Notes (Signed)
Pt called for triage, pt at vending machine getting a snack

## 2017-06-21 NOTE — ED Provider Notes (Signed)
[place] DEPT Provider Note   CSN: [id number] Arrival date & time: 06/21/17  2042     History   Chief Complaint Chief Complaint  Patient presents with  . Hyperglycemia    HPI Mike [name] is a 57 y.o. male.  57 yo M with a chief complaint of hyperglycemia.  This is an ongoing issue for him.  He has had 76 visits in the last 6 months.  He has a care plan due to his use of the emergency department.  He denies any other symptoms or concerns.  States his blood sugars been high.  The history is provided by the patient.  Hyperglycemia  Associated symptoms: no abdominal pain, no chest pain, no confusion, no fever, no shortness of breath and no vomiting   Illness  This is a chronic problem. The current episode started more than 1 week ago. The problem occurs constantly. The problem has not changed since onset.Pertinent negatives include no chest pain, no abdominal pain, no headaches and no shortness of breath. Nothing aggravates the symptoms. Nothing relieves the symptoms. He has tried nothing for the symptoms.    Past Medical History:  Diagnosis Date  . Anxiety   . Anxiety   . Chronic lower back pain   . Depression   . DKA (diabetic ketoacidoses) ([place]) 07/30/2016  . Hyperlipemia   . Hypertension   . Migraine    "last one was ~ 4 yr ago" (12/23/2014)  . Seizures ([place])    "related to pills for anxiety; if I don't take the pills I'm suppose to take I'll have them" (12/23/2014)  . Type II diabetes mellitus [facility])     Patient Active Problem List   Diagnosis Date Noted  . Hyperglycemia 05/26/2017  . Noncompliance 05/26/2017  . AKI (acute kidney injury) ([place]) 03/04/2017  . Malnutrition of moderate degree 02/08/2017  . DKA (diabetic ketoacidoses) ([place]) 02/07/2017  . History of stroke 02/07/2017  . Hyperphosphatemia 12/13/2016  . Evaluation by psychiatric service required 11/30/2016  . Lactic acidosis 11/18/2016  . Normocytic anemia 11/06/2016    . HLD (hyperlipidemia) 11/06/2016  . Adjustment disorder with other symptoms 11/05/2016  . Uncontrolled type 2 diabetes mellitus with hyperosmolar nonketotic hyperglycemia ([place]) 08/24/2016  . Essential hypertension 08/24/2016  . Acute ischemic stroke ([place])   . Protein-calorie malnutrition, severe 12/24/2014  . Homelessness 12/23/2014  . Hypertensive urgency 12/23/2014  . DM (diabetes mellitus) ([place]) 12/23/2014  . Intermittent palpitations 12/23/2014  . Tobacco abuse 12/23/2014  . Pleuritic chest pain 12/23/2014  . Abnormal EKG 12/23/2014  . Type II diabetes mellitus with renal manifestations [facility])     Past Surgical History:  Procedure Laterality Date  . NO PAST SURGERIES          Home Medications    Prior to Admission medications   Medication Sig Start Date End Date Taking? Authorizing Provider  amLODipine (NORVASC) 10 MG tablet Take 1 tablet (10 mg total) by mouth daily. 05/28/17   Dessa Phi, DO  aspirin 325 MG tablet Take 1 tablet (325 mg total) by mouth daily. 03/08/17   Georgette Shell, MD  atorvastatin (LIPITOR) 40 MG tablet Take 1 tablet (40 mg total) by mouth daily. 01/26/17   Cardama, Grayce Sessions, MD  blood glucose meter kit and supplies KIT Dispense based on patient and insurance preference. Use up to four times daily as directed. (FOR ICD-9 250.00, 250.01). 11/22/16   Velvet Bathe, MD  ferrous sulfate 325 (65 FE) MG tablet Take 1  tablet (325 mg total) daily by mouth. Patient taking differently: Take 325 mg by mouth daily as needed (low iron).  11/23/16   Ward, Delice Bison, DO  insulin aspart protamine- aspart (NOVOLOG MIX 70/30) (70-30) 100 UNIT/ML injection Inject 0.3 mLs (30 Units total) into the skin 2 (two) times daily with a meal. 04/30/17   Dorie Rank, MD  lisinopril (PRINIVIL,ZESTRIL) 40 MG tablet Take 1 tablet (40 mg total) by mouth daily. 04/30/17   Dorie Rank, MD  metoprolol tartrate (LOPRESSOR) 25 MG tablet Take 0.5 tablets (12.5 mg total) by mouth 2  (two) times daily. 03/07/17   Georgette Shell, MD    Family History Family History  Problem Relation Age of Onset  . Diabetes Mellitus II Mother   . Diabetes Mellitus II Father     Social History Social History   Tobacco Use  . Smoking status: Current Every Day Smoker    Packs/day: 0.10    Years: 40.00    Pack years: 4.00    Types: Cigarettes  . Smokeless tobacco: Never Used  Substance Use Topics  . Alcohol use: Yes    Alcohol/week: 1.2 oz    Types: 2 Cans of beer per week    Comment: No labs available at time of assessment  . Drug use: No    Comment: 12/23/2014 "stopped ~ 10 yrs ago"     Allergies   Ibuprofen and Tylenol [acetaminophen]   Review of Systems Review of Systems  Constitutional: Negative for chills and fever.  HENT: Negative for congestion and facial swelling.   Eyes: Negative for discharge and visual disturbance.  Respiratory: Negative for shortness of breath.   Cardiovascular: Negative for chest pain and palpitations.  Gastrointestinal: Negative for abdominal pain, diarrhea and vomiting.  Endocrine:       Hyperglycemia   Musculoskeletal: Negative for arthralgias and myalgias.  Skin: Negative for color change and rash.  Neurological: Negative for tremors, syncope and headaches.  Psychiatric/Behavioral: Negative for confusion and dysphoric mood.     Physical Exam Updated Vital Signs BP (!) 180/94 (BP Location: Left Arm)   Pulse 76   Temp 98.8 F (37.1 C) (Oral)   Resp 16   SpO2 98%   Physical Exam  Constitutional: He is oriented to person, place, and time. He appears well-developed and well-nourished.  HENT:  Head: Normocephalic and atraumatic.  Eyes: Pupils are equal, round, and reactive to light. EOM are normal.  Neck: Normal range of motion. Neck supple. No JVD present.  Cardiovascular: Normal rate and regular rhythm. Exam reveals no gallop and no friction rub.  No murmur heard. Pulmonary/Chest: No respiratory distress. He has no  wheezes.  Abdominal: He exhibits no distension. There is no rebound and no guarding.  Musculoskeletal: Normal range of motion.  Neurological: He is alert and oriented to person, place, and time.  Skin: No rash noted. No pallor.  Psychiatric: He has a normal mood and affect. His behavior is normal.  Nursing note and vitals reviewed.    ED Treatments / Results  Labs (all labs ordered are listed, but only abnormal results are displayed) Labs Reviewed  I-STAT CHEM 8, ED - Abnormal; Notable for the following components:      Result Value   Sodium 130 (*)    Chloride 94 (*)    Glucose, Bld >700 (*)    Hemoglobin 9.9 (*)    HCT 29.0 (*)    All other components within normal limits    EKG None  Radiology No results found.  Procedures Procedures (including critical care time)  Medications Ordered in ED Medications  insulin aspart (novoLOG) injection 15 Units (has no administration in time range)     Initial Impression / Assessment and Plan / ED Course  I have reviewed the triage vital signs and the nursing notes.  Pertinent labs & imaging results that were available during my care of the patient were reviewed by me and considered in my medical decision making (see chart for details).     57 yo M with a chief complaint of hyperglycemia.  The patient has no other complaints.  He was seen twice yesterday, has a care plan that says to just check a i-STAT Chem-8 and if he is not in DKA to discharge him home.  I said Chem-8 with a bicarb of 26.  Glucose greater than 700.  We will give the patient a dose of subcutaneous insulin and discharge him home as per his care plan.  9:32 PM:  I have discussed the diagnosis/risks/treatment options with the patient and believe the pt to be eligible for discharge home to follow-up with PCP. We also discussed returning to the ED immediately if new or worsening sx occur. We discussed the sx which are most concerning (e.g., abdominal pain, difficulty  breathing, confusion) that necessitate immediate return. Medications administered to the patient during their visit and any new prescriptions provided to the patient are listed below.  Medications given during this visit Medications  insulin aspart (novoLOG) injection 15 Units (has no administration in time range)      The patient appears reasonably screen and/or stabilized for discharge and I doubt any other medical condition or other [facility] requiring further screening, evaluation, or treatment in the ED at this time prior to discharge.    Final Clinical Impressions(s) / ED Diagnoses   Final diagnoses:  Hyperglycemia    ED Discharge Orders    None       Deno Etienne, DO 06/21/17 2132

## 2017-06-22 ENCOUNTER — Encounter (HOSPITAL_COMMUNITY): Payer: Self-pay

## 2017-06-22 ENCOUNTER — Emergency Department (HOSPITAL_COMMUNITY)
Admission: EM | Admit: 2017-06-22 | Discharge: 2017-06-22 | Disposition: A | Discharge status: Home / Self Care | Payer: Self-pay | Attending: Emergency Medicine | Admitting: Emergency Medicine

## 2017-06-22 ENCOUNTER — Other Ambulatory Visit: Payer: Self-pay

## 2017-06-22 DIAGNOSIS — Z79899 Other long term (current) drug therapy: Secondary | ICD-10-CM | POA: Insufficient documentation

## 2017-06-22 DIAGNOSIS — R739 Hyperglycemia, unspecified: Secondary | ICD-10-CM

## 2017-06-22 DIAGNOSIS — Z794 Long term (current) use of insulin: Secondary | ICD-10-CM | POA: Insufficient documentation

## 2017-06-22 DIAGNOSIS — F17210 Nicotine dependence, cigarettes, uncomplicated: Secondary | ICD-10-CM | POA: Insufficient documentation

## 2017-06-22 DIAGNOSIS — Z7982 Long term (current) use of aspirin: Secondary | ICD-10-CM | POA: Insufficient documentation

## 2017-06-22 DIAGNOSIS — E1165 Type 2 diabetes mellitus with hyperglycemia: Secondary | ICD-10-CM | POA: Insufficient documentation

## 2017-06-22 DIAGNOSIS — I10 Essential (primary) hypertension: Secondary | ICD-10-CM | POA: Insufficient documentation

## 2017-06-22 DIAGNOSIS — Z590 Homelessness: Secondary | ICD-10-CM | POA: Insufficient documentation

## 2017-06-22 LAB — I-STAT CHEM 8, ED
BUN: 16 mg/dL (ref 6–20)
BUN: 22 mg/dL — AB (ref 6–20)
CALCIUM ION: 1.21 mmol/L (ref 1.15–1.40)
CALCIUM ION: 1.13 mmol/L — AB (ref 1.15–1.40)
CHLORIDE: 97 mmol/L — AB (ref 101–111)
CHLORIDE: 90 mmol/L — AB (ref 101–111)
CREATININE: 0.6 mg/dL — AB (ref 0.61–1.24)
Creatinine, Ser: 0.8 mg/dL (ref 0.61–1.24)
GLUCOSE: 296 mg/dL — AB (ref 65–99)
GLUCOSE: 500 mg/dL — AB (ref 65–99)
HCT: 30 % — ABNORMAL LOW (ref 39.0–52.0)
HCT: 31 % — ABNORMAL LOW (ref 39.0–52.0)
Hemoglobin: 10.2 g/dL — ABNORMAL LOW (ref 13.0–17.0)
Hemoglobin: 10.5 g/dL — ABNORMAL LOW (ref 13.0–17.0)
POTASSIUM: 4.1 mmol/L (ref 3.5–5.1)
Potassium: 4.3 mmol/L (ref 3.5–5.1)
Sodium: 136 mmol/L (ref 135–145)
Sodium: 127 mmol/L — ABNORMAL LOW (ref 135–145)
TCO2: 30 mmol/L (ref 22–32)
TCO2: 28 mmol/L (ref 22–32)

## 2017-06-22 LAB — CBG MONITORING, ED
GLUCOSE-CAPILLARY: 296 mg/dL — AB (ref 65–99)
GLUCOSE-CAPILLARY: 439 mg/dL — AB (ref 65–99)

## 2017-06-22 MED ORDER — INSULIN ASPART 100 UNIT/ML ~~LOC~~ SOLN
15.0000 [IU] | Freq: Once | SUBCUTANEOUS | Status: AC
  Administered 2017-06-22: 15 [IU] via SUBCUTANEOUS
  Filled 2017-06-22: qty 1

## 2017-06-22 NOTE — ED Notes (Signed)
Secretary called Chaplain for clothes if available. Awaiting on return call.

## 2017-06-22 NOTE — ED Provider Notes (Signed)
[place] DEPT Provider Note   CSN: [id number] Arrival date & time: 06/22/17  1948     History   Chief Complaint Chief Complaint  Patient presents with  . Hyperglycemia    HPI Mike [name] is a 57 y.o. male.  57 yo M with a chief complaint of hyperglycemia.  The patient noted that it was high and she came back to the emergency department.  He was seen earlier this morning and then again this afternoon.  He denies any other complaint.  The history is provided by the patient.  Hyperglycemia  Associated symptoms: no abdominal pain, no chest pain, no confusion, no fever, no shortness of breath and no vomiting   Illness  This is a new problem. The current episode started 2 days ago. The problem occurs constantly. The problem has not changed since onset.Pertinent negatives include no chest pain, no abdominal pain, no headaches and no shortness of breath. Nothing aggravates the symptoms. Nothing relieves the symptoms.    Past Medical History:  Diagnosis Date  . Anxiety   . Anxiety   . Chronic lower back pain   . Depression   . DKA (diabetic ketoacidoses) ([place]) 07/30/2016  . Hyperlipemia   . Hypertension   . Migraine    "last one was ~ 4 yr ago" (12/23/2014)  . Seizures ([place])    "related to pills for anxiety; if I don't take the pills I'm suppose to take I'll have them" (12/23/2014)  . Type II diabetes mellitus [facility])     Patient Active Problem List   Diagnosis Date Noted  . Hyperglycemia 05/26/2017  . Noncompliance 05/26/2017  . AKI (acute kidney injury) ([place]) 03/04/2017  . Malnutrition of moderate degree 02/08/2017  . DKA (diabetic ketoacidoses) ([place]) 02/07/2017  . History of stroke 02/07/2017  . Hyperphosphatemia 12/13/2016  . Evaluation by psychiatric service required 11/30/2016  . Lactic acidosis 11/18/2016  . Normocytic anemia 11/06/2016  . HLD (hyperlipidemia) 11/06/2016  . Adjustment disorder with other symptoms 11/05/2016  .  Uncontrolled type 2 diabetes mellitus with hyperosmolar nonketotic hyperglycemia ([place]) 08/24/2016  . Essential hypertension 08/24/2016  . Acute ischemic stroke ([place])   . Protein-calorie malnutrition, severe 12/24/2014  . Homelessness 12/23/2014  . Hypertensive urgency 12/23/2014  . DM (diabetes mellitus) ([place]) 12/23/2014  . Intermittent palpitations 12/23/2014  . Tobacco abuse 12/23/2014  . Pleuritic chest pain 12/23/2014  . Abnormal EKG 12/23/2014  . Type II diabetes mellitus with renal manifestations [facility])     Past Surgical History:  Procedure Laterality Date  . NO PAST SURGERIES          Home Medications    Prior to Admission medications   Medication Sig Start Date End Date Taking? Authorizing Provider  amLODipine (NORVASC) 10 MG tablet Take 1 tablet (10 mg total) by mouth daily. 05/28/17   Dessa Phi, DO  aspirin 325 MG tablet Take 1 tablet (325 mg total) by mouth daily. 03/08/17   Georgette Shell, MD  atorvastatin (LIPITOR) 40 MG tablet Take 1 tablet (40 mg total) by mouth daily. 01/26/17   Cardama, Grayce Sessions, MD  blood glucose meter kit and supplies KIT Dispense based on patient and insurance preference. Use up to four times daily as directed. (FOR ICD-9 250.00, 250.01). 11/22/16   Velvet Bathe, MD  ferrous sulfate 325 (65 FE) MG tablet Take 1 tablet (325 mg total) daily by mouth. Patient taking differently: Take 325 mg by mouth daily as needed (low iron).  11/23/16  Ward, Delice Bison, DO  insulin aspart protamine- aspart (NOVOLOG MIX 70/30) (70-30) 100 UNIT/ML injection Inject 0.3 mLs (30 Units total) into the skin 2 (two) times daily with a meal. 04/30/17   Dorie Rank, MD  lisinopril (PRINIVIL,ZESTRIL) 40 MG tablet Take 1 tablet (40 mg total) by mouth daily. 04/30/17   Dorie Rank, MD  metoprolol tartrate (LOPRESSOR) 25 MG tablet Take 0.5 tablets (12.5 mg total) by mouth 2 (two) times daily. 03/07/17   Georgette Shell, MD    Family History Family History    Problem Relation Age of Onset  . Diabetes Mellitus II Mother   . Diabetes Mellitus II Father     Social History Social History   Tobacco Use  . Smoking status: Current Every Day Smoker    Packs/day: 0.10    Years: 40.00    Pack years: 4.00    Types: Cigarettes  . Smokeless tobacco: Never Used  Substance Use Topics  . Alcohol use: Yes    Alcohol/week: 1.2 oz    Types: 2 Cans of beer per week    Comment: No labs available at time of assessment  . Drug use: No    Comment: 12/23/2014 "stopped ~ 10 yrs ago"     Allergies   Ibuprofen and Tylenol [acetaminophen]   Review of Systems Review of Systems  Constitutional: Negative for chills and fever.  HENT: Negative for congestion and facial swelling.   Eyes: Negative for discharge and visual disturbance.  Respiratory: Negative for shortness of breath.   Cardiovascular: Negative for chest pain and palpitations.  Gastrointestinal: Negative for abdominal pain, diarrhea and vomiting.  Musculoskeletal: Negative for arthralgias and myalgias.  Skin: Negative for color change and rash.  Neurological: Negative for tremors, syncope and headaches.  Psychiatric/Behavioral: Negative for confusion and dysphoric mood.     Physical Exam Updated Vital Signs BP (!) 177/85 (BP Location: Right Arm)   Pulse 60   Temp 98.7 F (37.1 C) (Oral)   Resp 18   Ht _0  (1.93 m)   Wt 77.1 kg (170 lb)   SpO2 99%   BMI 20.69 kg/m   Physical Exam  Constitutional: He is oriented to person, place, and time. He appears well-developed and well-nourished.  HENT:  Head: Normocephalic and atraumatic.  Eyes: Pupils are equal, round, and reactive to light. EOM are normal.  Neck: Normal range of motion. Neck supple. No JVD present.  Cardiovascular: Normal rate and regular rhythm. Exam reveals no gallop and no friction rub.  No murmur heard. Pulmonary/Chest: No respiratory distress. He has no wheezes.  Abdominal: He exhibits no distension. There is no  rebound and no guarding.  Musculoskeletal: Normal range of motion.  Neurological: He is alert and oriented to person, place, and time.  Skin: No rash noted. No pallor.  Psychiatric: He has a normal mood and affect. His behavior is normal.  Nursing note and vitals reviewed.    ED Treatments / Results  Labs (all labs ordered are listed, but only abnormal results are displayed) Labs Reviewed  CBG MONITORING, ED - Abnormal; Notable for the following components:      Result Value   Glucose-Capillary 439 (*)    All other components within normal limits    EKG None  Radiology No results found.  Procedures Procedures (including critical care time)  Medications Ordered in ED Medications  insulin aspart (novoLOG) injection 15 Units (15 Units Subcutaneous Given 06/22/17 2027)     Initial Impression / Assessment and Plan /  ED Course  I have reviewed the triage vital signs and the nursing notes.  Pertinent labs & imaging results that were available during my care of the patient were reviewed by me and considered in my medical decision making (see chart for details).     57 yo M with a cc of hyperglycemia.  Seen two times earlier today for the same. Blood sugar checked was 439.  We will give another subcu dose of insulin.  Again I feel it is unlikely that his he developed an acidosis in less than a couple hours.  I do not feel that a metabolic panel needs to be rechecked.  8:40 PM:  I have discussed the diagnosis/risks/treatment options with the patient and believe the pt to be eligible for discharge home to follow-up with PCP. We also discussed returning to the ED immediately if new or worsening sx occur. We discussed the sx which are most concerning (e.g., sudden worsening pain, fever, inability to tolerate by mouth) that necessitate immediate return. Medications administered to the patient during their visit and any new prescriptions provided to the patient are listed  below.  Medications given during this visit Medications  insulin aspart (novoLOG) injection 15 Units (15 Units Subcutaneous Given 06/22/17 2027)     The patient appears reasonably screen and/or stabilized for discharge and I doubt any other medical condition or other [facility] requiring further screening, evaluation, or treatment in the ED at this time prior to discharge.    Final Clinical Impressions(s) / ED Diagnoses   Final diagnoses:  Hyperglycemia    ED Discharge Orders    None       Deno Etienne, DO 06/22/17 2040

## 2017-06-22 NOTE — ED Provider Notes (Signed)
[place] DEPT Provider Note   CSN: [id number] Arrival date & time: 06/22/17  1634     History   Chief Complaint Chief Complaint  Patient presents with  . Hyperglycemia    HPI Mike [name] is a 57 y.o. male.  57 yo M with a cc of hyperglycemia.  Patient noticed this today. Seen twice yesterday and once this morning for the same.    The history is provided by the patient.  Illness  This is a new problem. The current episode started yesterday. The problem occurs constantly. The problem has not changed since onset.Pertinent negatives include no chest pain, no abdominal pain, no headaches and no shortness of breath. Nothing aggravates the symptoms. Nothing relieves the symptoms. He has tried nothing for the symptoms. The treatment provided no relief.    Past Medical History:  Diagnosis Date  . Anxiety   . Anxiety   . Chronic lower back pain   . Depression   . DKA (diabetic ketoacidoses) ([place]) 07/30/2016  . Hyperlipemia   . Hypertension   . Migraine    "last one was ~ 4 yr ago" (12/23/2014)  . Seizures ([place])    "related to pills for anxiety; if I don't take the pills I'm suppose to take I'll have them" (12/23/2014)  . Type II diabetes mellitus [facility])     Patient Active Problem List   Diagnosis Date Noted  . Hyperglycemia 05/26/2017  . Noncompliance 05/26/2017  . AKI (acute kidney injury) ([place]) 03/04/2017  . Malnutrition of moderate degree 02/08/2017  . DKA (diabetic ketoacidoses) ([place]) 02/07/2017  . History of stroke 02/07/2017  . Hyperphosphatemia 12/13/2016  . Evaluation by psychiatric service required 11/30/2016  . Lactic acidosis 11/18/2016  . Normocytic anemia 11/06/2016  . HLD (hyperlipidemia) 11/06/2016  . Adjustment disorder with other symptoms 11/05/2016  . Uncontrolled type 2 diabetes mellitus with hyperosmolar nonketotic hyperglycemia ([place]) 08/24/2016  . Essential hypertension 08/24/2016  . Acute ischemic stroke ([place])     . Protein-calorie malnutrition, severe 12/24/2014  . Homelessness 12/23/2014  . Hypertensive urgency 12/23/2014  . DM (diabetes mellitus) ([place]) 12/23/2014  . Intermittent palpitations 12/23/2014  . Tobacco abuse 12/23/2014  . Pleuritic chest pain 12/23/2014  . Abnormal EKG 12/23/2014  . Type II diabetes mellitus with renal manifestations [facility])     Past Surgical History:  Procedure Laterality Date  . NO PAST SURGERIES          Home Medications    Prior to Admission medications   Medication Sig Start Date End Date Taking? Authorizing Provider  amLODipine (NORVASC) 10 MG tablet Take 1 tablet (10 mg total) by mouth daily. 05/28/17   Dessa Phi, DO  aspirin 325 MG tablet Take 1 tablet (325 mg total) by mouth daily. 03/08/17   Georgette Shell, MD  atorvastatin (LIPITOR) 40 MG tablet Take 1 tablet (40 mg total) by mouth daily. 01/26/17   Cardama, Grayce Sessions, MD  blood glucose meter kit and supplies KIT Dispense based on patient and insurance preference. Use up to four times daily as directed. (FOR ICD-9 250.00, 250.01). 11/22/16   Velvet Bathe, MD  ferrous sulfate 325 (65 FE) MG tablet Take 1 tablet (325 mg total) daily by mouth. Patient taking differently: Take 325 mg by mouth daily as needed (low iron).  11/23/16   Ward, Delice Bison, DO  insulin aspart protamine- aspart (NOVOLOG MIX 70/30) (70-30) 100 UNIT/ML injection Inject 0.3 mLs (30 Units total) into the skin 2 (two) times daily with  a meal. 04/30/17   Dorie Rank, MD  lisinopril (PRINIVIL,ZESTRIL) 40 MG tablet Take 1 tablet (40 mg total) by mouth daily. 04/30/17   Dorie Rank, MD  metoprolol tartrate (LOPRESSOR) 25 MG tablet Take 0.5 tablets (12.5 mg total) by mouth 2 (two) times daily. 03/07/17   Georgette Shell, MD    Family History Family History  Problem Relation Age of Onset  . Diabetes Mellitus II Mother   . Diabetes Mellitus II Father     Social History Social History   Tobacco Use  . Smoking status:  Current Every Day Smoker    Packs/day: 0.10    Years: 40.00    Pack years: 4.00    Types: Cigarettes  . Smokeless tobacco: Never Used  Substance Use Topics  . Alcohol use: Yes    Alcohol/week: 1.2 oz    Types: 2 Cans of beer per week    Comment: No labs available at time of assessment  . Drug use: No    Comment: 12/23/2014 "stopped ~ 10 yrs ago"     Allergies   Ibuprofen and Tylenol [acetaminophen]   Review of Systems Review of Systems  Constitutional: Negative for chills and fever.  HENT: Negative for congestion and facial swelling.   Eyes: Negative for discharge and visual disturbance.  Respiratory: Negative for shortness of breath.   Cardiovascular: Negative for chest pain and palpitations.  Gastrointestinal: Negative for abdominal pain, diarrhea and vomiting.  Musculoskeletal: Negative for arthralgias and myalgias.  Skin: Negative for color change and rash.  Neurological: Negative for tremors, syncope and headaches.  Psychiatric/Behavioral: Negative for confusion and dysphoric mood.     Physical Exam Updated Vital Signs BP (!) 165/80   Pulse 71   Temp 98.3 F (36.8 C) (Oral)   Resp 18   SpO2 100%   Physical Exam  Constitutional: He is oriented to person, place, and time. He appears well-developed and well-nourished.  disheveled   HENT:  Head: Normocephalic and atraumatic.  Eyes: Pupils are equal, round, and reactive to light. EOM are normal.  Neck: Normal range of motion. Neck supple. No JVD present.  Cardiovascular: Normal rate and regular rhythm. Exam reveals no gallop and no friction rub.  No murmur heard. Pulmonary/Chest: No respiratory distress. He has no wheezes.  Abdominal: He exhibits no distension and no mass. There is no tenderness. There is no rebound and no guarding.  Musculoskeletal: Normal range of motion.  Neurological: He is alert and oriented to person, place, and time.  Skin: No rash noted. No pallor.  Psychiatric: He has a normal mood  and affect. His behavior is normal.  Nursing note and vitals reviewed.    ED Treatments / Results  Labs (all labs ordered are listed, but only abnormal results are displayed) Labs Reviewed  I-STAT CHEM 8, ED - Abnormal; Notable for the following components:      Result Value   Sodium 127 (*)    Chloride 90 (*)    BUN 22 (*)    Glucose, Bld 500 (*)    Calcium, Ion 1.13 (*)    Hemoglobin 10.5 (*)    HCT 31.0 (*)    All other components within normal limits  CBG MONITORING, ED    EKG None  Radiology No results found.  Procedures Procedures (including critical care time)  Medications Ordered in ED Medications  insulin aspart (novoLOG) injection 15 Units (has no administration in time range)     Initial Impression / Assessment and Plan /  ED Course  I have reviewed the triage vital signs and the nursing notes.  Pertinent labs & imaging results that were available during my care of the patient were reviewed by me and considered in my medical decision making (see chart for details).     57 yo M with a chief complaint of hyperglycemia.  Blood sugars 500 without an anion gap.  We will give the patient a dose of subcu insulin.  Have him follow-up with his PCP.  5:36 PM:  I have discussed the diagnosis/risks/treatment options with the patient and family and believe the pt to be eligible for discharge home to follow-up with PCP. We also discussed returning to the ED immediately if new or worsening sx occur. We discussed the sx which are most concerning (e.g., sudden worsening pain, fever, inability to tolerate by mouth) that necessitate immediate return. Medications administered to the patient during their visit and any new prescriptions provided to the patient are listed below.  Medications given during this visit Medications  insulin aspart (novoLOG) injection 15 Units (has no administration in time range)      The patient appears reasonably screen and/or stabilized for  discharge and I doubt any other medical condition or other [facility] requiring further screening, evaluation, or treatment in the ED at this time prior to discharge.    Final Clinical Impressions(s) / ED Diagnoses   Final diagnoses:  Hyperglycemia    ED Discharge Orders    None       Deno Etienne, DO 06/22/17 1736

## 2017-06-22 NOTE — ED Provider Notes (Signed)
[place] DEPT Provider Note   CSN: [id number] Arrival date & time: 06/22/17  0272     History   Chief Complaint Chief Complaint  Patient presents with  . Hyperglycemia    HPI Mike [name] is a 58 y.o. male.  57 year old male here [facility].  Well-known to this department due to frequent presentations for hyperglycemia.  Patient himself has no place him at this time.  CBG here was 256.  No other complaints noted     Past Medical History:  Diagnosis Date  . Anxiety   . Anxiety   . Chronic lower back pain   . Depression   . DKA (diabetic ketoacidoses) ([place]) 07/30/2016  . Hyperlipemia   . Hypertension   . Migraine    "last one was ~ 4 yr ago" (12/23/2014)  . Seizures ([place])    "related to pills for anxiety; if I don't take the pills I'm suppose to take I'll have them" (12/23/2014)  . Type II diabetes mellitus [facility])     Patient Active Problem List   Diagnosis Date Noted  . Hyperglycemia 05/26/2017  . Noncompliance 05/26/2017  . AKI (acute kidney injury) ([place]) 03/04/2017  . Malnutrition of moderate degree 02/08/2017  . DKA (diabetic ketoacidoses) ([place]) 02/07/2017  . History of stroke 02/07/2017  . Hyperphosphatemia 12/13/2016  . Evaluation by psychiatric service required 11/30/2016  . Lactic acidosis 11/18/2016  . Normocytic anemia 11/06/2016  . HLD (hyperlipidemia) 11/06/2016  . Adjustment disorder with other symptoms 11/05/2016  . Uncontrolled type 2 diabetes mellitus with hyperosmolar nonketotic hyperglycemia ([place]) 08/24/2016  . Essential hypertension 08/24/2016  . Acute ischemic stroke ([place])   . Protein-calorie malnutrition, severe 12/24/2014  . Homelessness 12/23/2014  . Hypertensive urgency 12/23/2014  . DM (diabetes mellitus) ([place]) 12/23/2014  . Intermittent palpitations 12/23/2014  . Tobacco abuse 12/23/2014  . Pleuritic chest pain 12/23/2014  . Abnormal EKG 12/23/2014    . Type II diabetes mellitus with renal manifestations [facility])     Past Surgical History:  Procedure Laterality Date  . NO PAST SURGERIES          Home Medications    Prior to Admission medications   Medication Sig Start Date End Date Taking? Authorizing Provider  amLODipine (NORVASC) 10 MG tablet Take 1 tablet (10 mg total) by mouth daily. 05/28/17   Dessa Phi, DO  aspirin 325 MG tablet Take 1 tablet (325 mg total) by mouth daily. 03/08/17   Georgette Shell, MD  atorvastatin (LIPITOR) 40 MG tablet Take 1 tablet (40 mg total) by mouth daily. 01/26/17   Cardama, Grayce Sessions, MD  blood glucose meter kit and supplies KIT Dispense based on patient and insurance preference. Use up to four times daily as directed. (FOR ICD-9 250.00, 250.01). 11/22/16   Velvet Bathe, MD  ferrous sulfate 325 (65 FE) MG tablet Take 1 tablet (325 mg total) daily by mouth. Patient taking differently: Take 325 mg by mouth daily as needed (low iron).  11/23/16   Ward, Delice Bison, DO  insulin aspart protamine- aspart (NOVOLOG MIX 70/30) (70-30) 100 UNIT/ML injection Inject 0.3 mLs (30 Units total) into the skin 2 (two) times daily with a meal. 04/30/17   Dorie Rank, MD  lisinopril (PRINIVIL,ZESTRIL) 40 MG tablet Take 1 tablet (40 mg total) by mouth daily. 04/30/17   Dorie Rank, MD  metoprolol tartrate (LOPRESSOR) 25 MG tablet Take 0.5 tablets (12.5 mg total) by mouth 2 (two)  times daily. 03/07/17   Georgette Shell, MD    Family History Family History  Problem Relation Age of Onset  . Diabetes Mellitus II Mother   . Diabetes Mellitus II Father     Social History Social History   Tobacco Use  . Smoking status: Current Every Day Smoker    Packs/day: 0.10    Years: 40.00    Pack years: 4.00    Types: Cigarettes  . Smokeless tobacco: Never Used  Substance Use Topics  . Alcohol use: Yes    Alcohol/week: 1.2 oz    Types: 2 Cans of beer per week    Comment: No labs available at time of assessment  .  Drug use: No    Comment: 12/23/2014 "stopped ~ 10 yrs ago"     Allergies   Ibuprofen and Tylenol [acetaminophen]   Review of Systems Review of Systems  All other systems reviewed and are negative.    Physical Exam Updated Vital Signs BP (!) 163/85 (BP Location: Left Arm)   Pulse 60   Temp 98 F (36.7 C) (Oral)   Resp 15   Ht 1.93 m ('6\' 4"'$ )   Wt 77.1 kg (170 lb)   SpO2 100%   BMI 20.69 kg/m   Physical Exam  Constitutional: He appears well-developed and well-nourished.  Non-toxic appearance. No distress.  HENT:  Head: Normocephalic and atraumatic.  Eyes: Pupils are equal, round, and reactive to light. Conjunctivae, EOM and lids are normal.  Neck: Normal range of motion. Neck supple. No tracheal deviation present. No thyroid mass present.  Cardiovascular: Normal rate, regular rhythm and normal heart sounds. Exam reveals no gallop.  No murmur heard. Pulmonary/Chest: Effort normal and breath sounds normal. No stridor. No respiratory distress. He has no decreased breath sounds. He has no wheezes. He has no rhonchi. He has no rales.  Abdominal: Soft. Normal appearance and bowel sounds are normal. He exhibits no distension. There is no tenderness. There is no rebound and no CVA tenderness.  Musculoskeletal: Normal range of motion. He exhibits no edema or tenderness.  Neurological: He is alert. He is not disoriented. No cranial nerve deficit or sensory deficit. GCS eye subscore is 4. GCS verbal subscore is 5. GCS motor subscore is 6.  Skin: Skin is warm and dry. No abrasion and no rash noted.  Psychiatric: His affect is blunt. His speech is delayed. He is withdrawn.  Nursing note and vitals reviewed.    ED Treatments / Results  Labs (all labs ordered are listed, but only abnormal results are displayed) Labs Reviewed  I-STAT CHEM 8, ED    EKG None  Radiology No results found.  Procedures Procedures (including critical care time)  Medications Ordered in  ED Medications - No data to display   Initial Impression / Assessment and Plan / ED Course  I have reviewed the triage vital signs and the nursing notes.  Pertinent labs & imaging results that were available during my care of the patient were reviewed by me and considered in my medical decision making (see chart for details).    Patient here after blood sugar was found to be 344.  Here it is 256.  He is due for discharge   Final Clinical Impressions(s) / ED Diagnoses   Final diagnoses:  None    ED Discharge Orders    None       Lacretia Leigh, MD 06/22/17 [id number]

## 2017-06-22 NOTE — ED Notes (Signed)
PT DISCHARGED. INSTRUCTIONS GIVEN. AAOX4. PT IN NO APPARENT DISTRESS OR PAIN. THE OPPORTUNITY TO ASK QUESTIONS WAS PROVIDED. 

## 2017-06-22 NOTE — ED Notes (Signed)
Bed: WLPT2 Expected date:  Expected time:  Means of arrival:  Comments: 

## 2017-06-22 NOTE — ED Triage Notes (Signed)
[facility] CBG 471.

## 2017-06-22 NOTE — ED Notes (Signed)
Bed: WLPT3 Expected date:  Expected time:  Means of arrival:  Comments: [place]

## 2017-06-22 NOTE — ED Notes (Signed)
Chaplain consulted by RN for clothing from clothing closet.    Nursing had located shoes for Mr Mike [name].  Delivered XL shirt and size 36 pants.   Though pt stated he wears 35, clothing closet had 34 and 36.  Pt thought these would fit.  Please notify if needing a smaller size pant.  [facility] / [facility] Chaplain Burnis KingfisherMatthew Shamera Yarberry, MDiv, [facility]

## 2017-06-22 NOTE — ED Triage Notes (Signed)
[id number]. Patient c/o hyperglycemia.

## 2017-06-22 NOTE — ED Triage Notes (Signed)
[facility]. CBG- "HIGH"

## 2017-06-23 ENCOUNTER — Encounter (HOSPITAL_COMMUNITY): Payer: Self-pay | Admitting: Emergency Medicine

## 2017-06-23 ENCOUNTER — Emergency Department (HOSPITAL_COMMUNITY)
Admission: EM | Admit: 2017-06-23 | Discharge: 2017-06-24 | Disposition: A | Discharge status: Home / Self Care | Payer: Self-pay | Attending: Emergency Medicine | Admitting: Emergency Medicine

## 2017-06-23 ENCOUNTER — Emergency Department (HOSPITAL_COMMUNITY)
Admission: EM | Admit: 2017-06-23 | Discharge: 2017-06-23 | Disposition: A | Discharge status: Home / Self Care | Payer: Self-pay | Attending: Emergency Medicine | Admitting: Emergency Medicine

## 2017-06-23 ENCOUNTER — Other Ambulatory Visit: Payer: Self-pay

## 2017-06-23 DIAGNOSIS — Z5321 Procedure and treatment not carried out due to patient leaving prior to being seen by health care provider: Secondary | ICD-10-CM | POA: Insufficient documentation

## 2017-06-23 DIAGNOSIS — Z8673 Personal history of transient ischemic attack (TIA), and cerebral infarction without residual deficits: Secondary | ICD-10-CM | POA: Insufficient documentation

## 2017-06-23 DIAGNOSIS — E1165 Type 2 diabetes mellitus with hyperglycemia: Secondary | ICD-10-CM | POA: Insufficient documentation

## 2017-06-23 DIAGNOSIS — Z79899 Other long term (current) drug therapy: Secondary | ICD-10-CM | POA: Insufficient documentation

## 2017-06-23 DIAGNOSIS — R51 Headache: Secondary | ICD-10-CM | POA: Insufficient documentation

## 2017-06-23 DIAGNOSIS — Z590 Homelessness: Secondary | ICD-10-CM | POA: Insufficient documentation

## 2017-06-23 DIAGNOSIS — E119 Type 2 diabetes mellitus without complications: Secondary | ICD-10-CM | POA: Insufficient documentation

## 2017-06-23 DIAGNOSIS — I10 Essential (primary) hypertension: Secondary | ICD-10-CM | POA: Insufficient documentation

## 2017-06-23 DIAGNOSIS — R739 Hyperglycemia, unspecified: Secondary | ICD-10-CM

## 2017-06-23 DIAGNOSIS — F17210 Nicotine dependence, cigarettes, uncomplicated: Secondary | ICD-10-CM | POA: Insufficient documentation

## 2017-06-23 DIAGNOSIS — E162 Hypoglycemia, unspecified: Secondary | ICD-10-CM | POA: Insufficient documentation

## 2017-06-23 DIAGNOSIS — Z7982 Long term (current) use of aspirin: Secondary | ICD-10-CM | POA: Insufficient documentation

## 2017-06-23 DIAGNOSIS — Z794 Long term (current) use of insulin: Secondary | ICD-10-CM | POA: Insufficient documentation

## 2017-06-23 LAB — CBG MONITORING, ED
GLUCOSE-CAPILLARY: 92 mg/dL (ref 65–99)
GLUCOSE-CAPILLARY: 225 mg/dL — AB (ref 65–99)
GLUCOSE-CAPILLARY: 328 mg/dL — AB (ref 65–99)
Glucose-Capillary: 158 mg/dL — ABNORMAL HIGH (ref 65–99)
Glucose-Capillary: 575 mg/dL (ref 65–99)

## 2017-06-23 LAB — I-STAT CHEM 8, ED
BUN: 25 mg/dL — ABNORMAL HIGH (ref 6–20)
CALCIUM ION: 1.18 mmol/L (ref 1.15–1.40)
Chloride: 94 mmol/L — ABNORMAL LOW (ref 101–111)
Creatinine, Ser: 0.8 mg/dL (ref 0.61–1.24)
HCT: 31 % — ABNORMAL LOW (ref 39.0–52.0)
HEMOGLOBIN: 10.5 g/dL — AB (ref 13.0–17.0)
Potassium: 4.6 mmol/L (ref 3.5–5.1)
SODIUM: 131 mmol/L — AB (ref 135–145)
TCO2: 30 mmol/L (ref 22–32)

## 2017-06-23 MED ORDER — AMLODIPINE BESYLATE 5 MG PO TABS
10.0000 mg | ORAL_TABLET | Freq: Once | ORAL | Status: AC
  Administered 2017-06-23: 10 mg via ORAL
  Filled 2017-06-23: qty 2

## 2017-06-23 MED ORDER — INSULIN ASPART 100 UNIT/ML ~~LOC~~ SOLN
30.0000 [IU] | Freq: Once | SUBCUTANEOUS | Status: AC
  Administered 2017-06-23: 30 [IU] via SUBCUTANEOUS
  Filled 2017-06-23: qty 1

## 2017-06-23 NOTE — Discharge Instructions (Addendum)
1. Medications: usual home medications 2. Treatment: rest, drink plenty of fluids,  3. Follow Up: Please followup with your primary doctor in 1 days for discussion of your diagnoses and further evaluation after today's visit; if you do not have a primary care doctor use the resource guide provided to find one; Please return to the ER for worsening symptoms

## 2017-06-23 NOTE — ED Notes (Signed)
Bed: [facility]PT4 Expected date:  Expected time:  Means of arrival:  Comments: 

## 2017-06-23 NOTE — ED Notes (Signed)
Not in waiting area.  

## 2017-06-23 NOTE — ED Provider Notes (Signed)
Patient resting comfortably after treatment with insulin and given a meal here..  He appears in no distress   Doug SouJacubowitz, Evyn Kooyman, MD 06/23/17 [phone]

## 2017-06-23 NOTE — ED Notes (Signed)
ED Provider at bedside. 

## 2017-06-23 NOTE — ED Triage Notes (Signed)
Patient discharged earlier and was seen eating four honey buns in the waiting room.  Checked back in for high blood sugar per patient.

## 2017-06-23 NOTE — ED Notes (Signed)
Writer attempted to get pt to room assignment, pt refused to go and stated he doesn't want to be seen by a doctor today X2

## 2017-06-23 NOTE — ED Triage Notes (Signed)
Per EMS:  Patient presents to ED for assessment of HTN from homeless shelter.  Strong smell of feces and urine.  BP 162/0 en route

## 2017-06-23 NOTE — ED Triage Notes (Signed)
The pt has tried to check in  X 2  The last time it was a headache.  Now after sitting in the waiting room drinking a regular  Coke and  Eating a honey bun  He says he thinks his sugar is up

## 2017-06-23 NOTE — ED Notes (Signed)
Patient brought back to room covered in feces and soaking wet. Patient completely cleaned in room, soiled clothing double bagged and kept in room. Patient states a willingness to keep clothing. Patient compliant at this time. During cleaning several bugs and ants noticed climbing out of patients hair.

## 2017-06-23 NOTE — ED Triage Notes (Signed)
Patient is complaining of blood sugar high.

## 2017-06-23 NOTE — ED Provider Notes (Signed)
[place] [facility] EMERGENCY DEPARTMENT Provider Note   CSN: [id number] Arrival date & time: 06/23/17  1229     History   Chief Complaint Chief Complaint  Patient presents with  . Hypertension    HPI Mike [name] is a 57 y.o. male with a h/o IDDM Type II, DKA, and HTN who is well-known to the emergency department and presents with a chief complaint of " my sugar is high."  The patient was brought to the emergency department by EMS from a homeless shelter.  He was noted to have a strong smell of feces and urine.  The patient has a history of arriving to the ED with severe hyperglycemia; however, has only been admitted 4 times despite having 82 visits in the ED over the last 6 months.  The patient was most recently evaluated at [facility] approximately 12 hours ago.  He was also evaluated in the ED 2 times the day before.  Patient's baseline CBG is in the 500-600 range.   Today, he has no other complaints including confusion, nausea, vomiting, diarrhea, abdominal pain, chest pain, or dyspnea.  No treatment prior to arrival.  The history is provided by the patient. No language interpreter was used.  Hypertension  Pertinent negatives include no chest pain, no abdominal pain, no headaches and no shortness of breath.    Past Medical History:  Diagnosis Date  . Anxiety   . Anxiety   . Chronic lower back pain   . Depression   . DKA (diabetic ketoacidoses) ([place]) 07/30/2016  . Hyperlipemia   . Hypertension   . Migraine    "last one was ~ 4 yr ago" (12/23/2014)  . Seizures ([name])    "related to pills for anxiety; if I don't take the pills I'm suppose to take I'll have them" (12/23/2014)  . Type II diabetes mellitus [facility])     Patient Active Problem List   Diagnosis Date Noted  . Hyperglycemia 05/26/2017  . Noncompliance 05/26/2017  . AKI (acute kidney injury) ([place]) 03/04/2017  . Malnutrition of moderate degree 02/08/2017  . DKA (diabetic ketoacidoses) ([place])  02/07/2017  . History of stroke 02/07/2017  . Hyperphosphatemia 12/13/2016  . Evaluation by psychiatric service required 11/30/2016  . Lactic acidosis 11/18/2016  . Normocytic anemia 11/06/2016  . HLD (hyperlipidemia) 11/06/2016  . Adjustment disorder with other symptoms 11/05/2016  . Uncontrolled type 2 diabetes mellitus with hyperosmolar nonketotic hyperglycemia ([place]) 08/24/2016  . Essential hypertension 08/24/2016  . Acute ischemic stroke ([place])   . Protein-calorie malnutrition, severe 12/24/2014  . Homelessness 12/23/2014  . Hypertensive urgency 12/23/2014  . DM (diabetes mellitus) ([place]) 12/23/2014  . Intermittent palpitations 12/23/2014  . Tobacco abuse 12/23/2014  . Pleuritic chest pain 12/23/2014  . Abnormal EKG 12/23/2014  . Type II diabetes mellitus with renal manifestations [facility])     Past Surgical History:  Procedure Laterality Date  . NO PAST SURGERIES          Home Medications    Prior to Admission medications   Medication Sig Start Date End Date Taking? Authorizing Provider  amLODipine (NORVASC) 10 MG tablet Take 1 tablet (10 mg total) by mouth daily. 05/28/17   Dessa Phi, DO  aspirin 325 MG tablet Take 1 tablet (325 mg total) by mouth daily. 03/08/17   Georgette Shell, MD  atorvastatin (LIPITOR) 40 MG tablet Take 1 tablet (40 mg total) by mouth daily. 01/26/17   CardamaGrayce Sessions, MD  blood glucose meter kit and supplies KIT  Dispense based on patient and insurance preference. Use up to four times daily as directed. (FOR ICD-9 250.00, 250.01). 11/22/16   Velvet Bathe, MD  ferrous sulfate 325 (65 FE) MG tablet Take 1 tablet (325 mg total) daily by mouth. Patient taking differently: Take 325 mg by mouth daily as needed (low iron).  11/23/16   Ward, Delice Bison, DO  insulin aspart protamine- aspart (NOVOLOG MIX 70/30) (70-30) 100 UNIT/ML injection Inject 0.3 mLs (30 Units total) into the skin 2 (two) times daily with a meal. 04/30/17   Dorie Rank, MD    lisinopril (PRINIVIL,ZESTRIL) 40 MG tablet Take 1 tablet (40 mg total) by mouth daily. 04/30/17   Dorie Rank, MD  metoprolol tartrate (LOPRESSOR) 25 MG tablet Take 0.5 tablets (12.5 mg total) by mouth 2 (two) times daily. 03/07/17   Georgette Shell, MD    Family History Family History  Problem Relation Age of Onset  . Diabetes Mellitus II Mother   . Diabetes Mellitus II Father     Social History Social History   Tobacco Use  . Smoking status: Current Every Day Smoker    Packs/day: 0.10    Years: 40.00    Pack years: 4.00    Types: Cigarettes  . Smokeless tobacco: Never Used  Substance Use Topics  . Alcohol use: Yes    Alcohol/week: 1.2 oz    Types: 2 Cans of beer per week    Comment: No labs available at time of assessment  . Drug use: No    Comment: 12/23/2014 "stopped ~ 10 yrs ago"     Allergies   Ibuprofen and Tylenol [acetaminophen]   Review of Systems Review of Systems  Constitutional: Negative for appetite change and fever.  Respiratory: Negative for shortness of breath.   Cardiovascular: Negative for chest pain.  Gastrointestinal: Negative for abdominal pain.  Genitourinary: Negative for dysuria.  Musculoskeletal: Negative for back pain.  Skin: Negative for rash.  Allergic/Immunologic: Negative for immunocompromised state.  Neurological: Negative for headaches.  Psychiatric/Behavioral: Negative for confusion.   Physical Exam Updated Vital Signs BP (!) 186/100 (BP Location: Right Arm)   Pulse 71   Temp 98.3 F (36.8 C) (Oral)   Resp 18   SpO2 100%   Physical Exam  Constitutional: He appears well-developed.  Disheveled.  Strong smell of feces and urine.  HENT:  Head: Normocephalic.  Eyes: Conjunctivae are normal.  Neck: Neck supple.  Cardiovascular: Normal rate, regular rhythm, normal heart sounds and intact distal pulses. Exam reveals no gallop and no friction rub.  No murmur heard. Pulmonary/Chest: Effort normal and breath sounds normal. No  stridor. No respiratory distress. He has no wheezes. He has no rales. He exhibits no tenderness.  Abdominal: Soft. Bowel sounds are normal. He exhibits no distension and no mass. There is no tenderness. There is no rebound and no guarding. No hernia.  Abdomen is soft, nontender, nondistended.  Neurological: He is alert.  Speaks in complete, fluent sentences.  Patient is cooperative and appropriate.  Alert.  Skin: Skin is warm and dry.  Psychiatric: His behavior is normal.  Nursing note and vitals reviewed.    ED Treatments / Results  Labs (all labs ordered are listed, but only abnormal results are displayed) Labs Reviewed  I-STAT CHEM 8, ED - Abnormal; Notable for the following components:      Result Value   Sodium 131 (*)    Chloride 94 (*)    BUN 25 (*)    Glucose, Bld >700 (*)  Hemoglobin 10.5 (*)    HCT 31.0 (*)    All other components within normal limits  CBG MONITORING, ED - Abnormal; Notable for the following components:   Glucose-Capillary 575 (*)    All other components within normal limits    EKG None  Radiology No results found.  Procedures Procedures (including critical care time)  Medications Ordered in ED Medications  insulin aspart (novoLOG) injection 30 Units (30 Units Subcutaneous Given 06/23/17 1427)  amLODipine (NORVASC) tablet 10 mg (10 mg Oral Given 06/23/17 1427)     Initial Impression / Assessment and Plan / ED Course  I have reviewed the triage vital signs and the nursing notes.  Pertinent labs & imaging results that were available during my care of the patient were reviewed by me and considered in my medical decision making (see chart for details).     57 year old male with a history of IDDM, DKA, and HTN who presents to the emergency department by EMS with a chief complaint of hyperglycemia.  Of note, this is the patient's second evaluation in the last 12 hours and his fifth visit to the emergency department since yesterday morning.   I-STAT Chem-8 with glucose >700. AG is 13.  30 units of subcutaneous novolong given.  10 mg of amlodipine given for elevated BP of 171/88 although the patient has no complaints of signs or symptoms associated with end organ disease at this time.  Repeat CBG 575.  The patient was discussed and evaluated along with Dr. Cathleen Fears, attending physician.  He is hemodynamically stable in no acute distress.  He is requesting a diet Coke.  Strict return precautions given.  He has been given extensive follow-up instructions to follow up with [facility] see at the [facility] to have his blood pressure rechecked.  He is safe for discharge home with outpatient follow-up at this time.  Final Clinical Impressions(s) / ED Diagnoses   Final diagnoses:  Hyperglycemia  Hypertension, unspecified type    ED Discharge Orders    None       Deion Forgue A, PA-C 06/23/17 1649    Orlie Dakin, MD 06/24/17 0700

## 2017-06-23 NOTE — ED Provider Notes (Signed)
[place] DEPT Provider Note   CSN: [id number] Arrival date & time: 06/23/17  0146     History   Chief Complaint Chief Complaint  Patient presents with  . Hyperglycemia    HPI Mike [name] is a 57 y.o. male well known to the emergency department with a hx of insulin-dependent diabetes, DKA, hypertension presents to the Emergency Department with complaints about his blood sugar.  Pt patient frequently arrives with severe hyperglycemia but has only required admission several times for DKA.  Patient was last seen several hours ago and was not found to be in DKA at that time.  He was given his usual insulin.  Additionally, patient was seen yesterday morning and yesterday afternoon.  Blood sugar on arrival is 92.  Record review shows the patient normally lives in the 500-600 range.     The history is provided by the patient and medical records. No language interpreter was used.    Past Medical History:  Diagnosis Date  . Anxiety   . Anxiety   . Chronic lower back pain   . Depression   . DKA (diabetic ketoacidoses) ([place]) 07/30/2016  . Hyperlipemia   . Hypertension   . Migraine    "last one was ~ 4 yr ago" (12/23/2014)  . Seizures ([place])    "related to pills for anxiety; if I don't take the pills I'm suppose to take I'll have them" (12/23/2014)  . Type II diabetes mellitus [facility])     Patient Active Problem List   Diagnosis Date Noted  . Hyperglycemia 05/26/2017  . Noncompliance 05/26/2017  . AKI (acute kidney injury) ([place]) 03/04/2017  . Malnutrition of moderate degree 02/08/2017  . DKA (diabetic ketoacidoses) ([place]) 02/07/2017  . History of stroke 02/07/2017  . Hyperphosphatemia 12/13/2016  . Evaluation by psychiatric service required 11/30/2016  . Lactic acidosis 11/18/2016  . Normocytic anemia 11/06/2016  . HLD (hyperlipidemia) 11/06/2016  . Adjustment disorder with other symptoms 11/05/2016  . Uncontrolled type 2 diabetes mellitus with  hyperosmolar nonketotic hyperglycemia ([place]) 08/24/2016  . Essential hypertension 08/24/2016  . Acute ischemic stroke ([place])   . Protein-calorie malnutrition, severe 12/24/2014  . Homelessness 12/23/2014  . Hypertensive urgency 12/23/2014  . DM (diabetes mellitus) ([place]) 12/23/2014  . Intermittent palpitations 12/23/2014  . Tobacco abuse 12/23/2014  . Pleuritic chest pain 12/23/2014  . Abnormal EKG 12/23/2014  . Type II diabetes mellitus with renal manifestations [facility])     Past Surgical History:  Procedure Laterality Date  . NO PAST SURGERIES          Home Medications    Prior to Admission medications   Medication Sig Start Date End Date Taking? Authorizing Provider  amLODipine (NORVASC) 10 MG tablet Take 1 tablet (10 mg total) by mouth daily. 05/28/17   Dessa Phi, DO  aspirin 325 MG tablet Take 1 tablet (325 mg total) by mouth daily. 03/08/17   Georgette Shell, MD  atorvastatin (LIPITOR) 40 MG tablet Take 1 tablet (40 mg total) by mouth daily. 01/26/17   Cardama, Grayce Sessions, MD  blood glucose meter kit and supplies KIT Dispense based on patient and insurance preference. Use up to four times daily as directed. (FOR ICD-9 250.00, 250.01). 11/22/16   Velvet Bathe, MD  ferrous sulfate 325 (65 FE) MG tablet Take 1 tablet (325 mg total) daily by mouth. Patient taking differently: Take 325 mg by mouth daily as needed (low iron).  11/23/16   Ward, Delice Bison, DO  insulin aspart protamine-  aspart (NOVOLOG MIX 70/30) (70-30) 100 UNIT/ML injection Inject 0.3 mLs (30 Units total) into the skin 2 (two) times daily with a meal. 04/30/17   Dorie Rank, MD  lisinopril (PRINIVIL,ZESTRIL) 40 MG tablet Take 1 tablet (40 mg total) by mouth daily. 04/30/17   Dorie Rank, MD  metoprolol tartrate (LOPRESSOR) 25 MG tablet Take 0.5 tablets (12.5 mg total) by mouth 2 (two) times daily. 03/07/17   Georgette Shell, MD    Family History Family History  Problem Relation Age of Onset  . Diabetes  Mellitus II Mother   . Diabetes Mellitus II Father     Social History Social History   Tobacco Use  . Smoking status: Current Every Day Smoker    Packs/day: 0.10    Years: 40.00    Pack years: 4.00    Types: Cigarettes  . Smokeless tobacco: Never Used  Substance Use Topics  . Alcohol use: Yes    Alcohol/week: 1.2 oz    Types: 2 Cans of beer per week    Comment: No labs available at time of assessment  . Drug use: No    Comment: 12/23/2014 "stopped ~ 10 yrs ago"     Allergies   Ibuprofen and Tylenol [acetaminophen]   Review of Systems Review of Systems  Constitutional: Negative for appetite change, diaphoresis, fatigue, fever and unexpected weight change.  HENT: Negative for mouth sores.   Eyes: Negative for visual disturbance.  Respiratory: Negative for cough, chest tightness, shortness of breath and wheezing.   Cardiovascular: Negative for chest pain.  Gastrointestinal: Positive for nausea ( mild). Negative for abdominal pain, constipation, diarrhea and vomiting.  Endocrine: Negative for polydipsia, polyphagia and polyuria.  Genitourinary: Negative for dysuria, frequency, hematuria and urgency.  Musculoskeletal: Negative for back pain and neck stiffness.  Skin: Negative for rash.  Allergic/Immunologic: Negative for immunocompromised state.  Neurological: Negative for syncope, light-headedness and headaches.  Hematological: Does not bruise/bleed easily.  Psychiatric/Behavioral: Negative for sleep disturbance. The patient is not nervous/anxious.      Physical Exam Updated Vital Signs BP (!) 189/91 (BP Location: Right Arm)   Pulse 61   Temp 98.5 F (36.9 C) (Oral)   Resp 18   Ht '6\' 4"'$  (1.93 m)   Wt 77.1 kg (170 lb)   SpO2 100%   BMI 20.69 kg/m   Physical Exam  Constitutional: He appears well-developed and well-nourished. No distress.  HENT:  Head: Normocephalic.  Eyes: Conjunctivae are normal. No scleral icterus.  Neck: Normal range of motion.    Cardiovascular: Normal rate and intact distal pulses.  Pulmonary/Chest: Effort normal and breath sounds normal.  Abdominal: Soft. He exhibits no distension. There is no tenderness.  Musculoskeletal: Normal range of motion.  Neurological: He is alert.  Patient alert and interactive Answers questions appropriately  Skin: Skin is warm and dry.  Psychiatric: He has a normal mood and affect.  Nursing note and vitals reviewed.    ED Treatments / Results  Labs (all labs ordered are listed, but only abnormal results are displayed) Labs Reviewed  CBG MONITORING, ED - Abnormal; Notable for the following components:      Result Value   Glucose-Capillary 158 (*)    All other components within normal limits  CBG MONITORING, ED - Abnormal; Notable for the following components:   Glucose-Capillary 225 (*)    All other components within normal limits  CBG MONITORING, ED  CBG MONITORING, ED  CBG MONITORING, ED     Procedures Procedures (including critical  care time)  Medications Ordered in ED Medications - No data to display   Initial Impression / Assessment and Plan / ED Course  I have reviewed the triage vital signs and the nursing notes.  Pertinent labs & imaging results that were available during my care of the patient were reviewed by me and considered in my medical decision making (see chart for details).     Patient with a history of insulin-dependent diabetes and hyperglycemia presents to the emergency department for the 4th time today.  Check of his blood sugar shows it at 92.  This is well below patient's normal.  He was given something to eat here in the emergency department.  Patient was without confusion or diaphoresis on my initial exam.  3:22 AM Repeat blood sugar is above 200.  Patient remains alert and oriented.  I suspect his blood sugar dropped because he was given insulin here this evening and did not have anything to eat.  He reports he feels well.  He will be  discharged home.  Final Clinical Impressions(s) / ED Diagnoses   Final diagnoses:  Hypoglycemia    ED Discharge Orders    None       Loni Muse Gwenlyn Perking 06/23/17 0323    Davonna Belling, MD 06/23/17 [phone]

## 2017-06-23 NOTE — Discharge Instructions (Addendum)
Please follow-up with Mike [name] to have your blood pressure rechecked.  Return to the emergency department if you develop vomiting, chest pain, confusion, or other new concerning symptoms.

## 2017-06-23 NOTE — ED Notes (Signed)
Patient unwilling to sign discharge.

## 2017-08-03 ENCOUNTER — Emergency Department (HOSPITAL_COMMUNITY): Payer: Medicaid Other

## 2017-08-03 ENCOUNTER — Other Ambulatory Visit: Payer: Self-pay

## 2017-08-03 ENCOUNTER — Encounter (HOSPITAL_COMMUNITY): Payer: Self-pay

## 2017-08-03 ENCOUNTER — Inpatient Hospital Stay (HOSPITAL_COMMUNITY)
Admission: EM | Admit: 2017-08-03 | Discharge: 2017-08-16 | DRG: 637 | Disposition: A | Discharge status: Home / Self Care | Payer: Medicaid Other | Attending: Internal Medicine | Admitting: Internal Medicine

## 2017-08-03 DIAGNOSIS — F17210 Nicotine dependence, cigarettes, uncomplicated: Secondary | ICD-10-CM | POA: Diagnosis present

## 2017-08-03 DIAGNOSIS — R413 Other amnesia: Secondary | ICD-10-CM

## 2017-08-03 DIAGNOSIS — E1100 Type 2 diabetes mellitus with hyperosmolarity without nonketotic hyperglycemic-hyperosmolar coma (NKHHC): Principal | ICD-10-CM | POA: Diagnosis present

## 2017-08-03 DIAGNOSIS — K5900 Constipation, unspecified: Secondary | ICD-10-CM | POA: Diagnosis present

## 2017-08-03 DIAGNOSIS — Z794 Long term (current) use of insulin: Secondary | ICD-10-CM | POA: Diagnosis not present

## 2017-08-03 DIAGNOSIS — E785 Hyperlipidemia, unspecified: Secondary | ICD-10-CM | POA: Diagnosis present

## 2017-08-03 DIAGNOSIS — E861 Hypovolemia: Secondary | ICD-10-CM | POA: Diagnosis present

## 2017-08-03 DIAGNOSIS — Z9114 Patient's other noncompliance with medication regimen: Secondary | ICD-10-CM | POA: Diagnosis not present

## 2017-08-03 DIAGNOSIS — K649 Unspecified hemorrhoids: Secondary | ICD-10-CM | POA: Diagnosis present

## 2017-08-03 DIAGNOSIS — Z9119 Patient's noncompliance with other medical treatment and regimen: Secondary | ICD-10-CM

## 2017-08-03 DIAGNOSIS — Z7982 Long term (current) use of aspirin: Secondary | ICD-10-CM | POA: Diagnosis not present

## 2017-08-03 DIAGNOSIS — Z681 Body mass index (BMI) 19 or less, adult: Secondary | ICD-10-CM | POA: Diagnosis not present

## 2017-08-03 DIAGNOSIS — Z5900 Homelessness unspecified: Secondary | ICD-10-CM

## 2017-08-03 DIAGNOSIS — E43 Unspecified severe protein-calorie malnutrition: Secondary | ICD-10-CM | POA: Diagnosis present

## 2017-08-03 DIAGNOSIS — N179 Acute kidney failure, unspecified: Secondary | ICD-10-CM | POA: Diagnosis present

## 2017-08-03 DIAGNOSIS — E109 Type 1 diabetes mellitus without complications: Secondary | ICD-10-CM

## 2017-08-03 DIAGNOSIS — D638 Anemia in other chronic diseases classified elsewhere: Secondary | ICD-10-CM | POA: Diagnosis present

## 2017-08-03 DIAGNOSIS — I10 Essential (primary) hypertension: Secondary | ICD-10-CM | POA: Diagnosis present

## 2017-08-03 DIAGNOSIS — E860 Dehydration: Secondary | ICD-10-CM | POA: Diagnosis present

## 2017-08-03 DIAGNOSIS — Z79899 Other long term (current) drug therapy: Secondary | ICD-10-CM | POA: Diagnosis not present

## 2017-08-03 DIAGNOSIS — R410 Disorientation, unspecified: Secondary | ICD-10-CM

## 2017-08-03 DIAGNOSIS — Z833 Family history of diabetes mellitus: Secondary | ICD-10-CM | POA: Diagnosis not present

## 2017-08-03 DIAGNOSIS — E870 Hyperosmolality and hypernatremia: Secondary | ICD-10-CM | POA: Diagnosis present

## 2017-08-03 DIAGNOSIS — Z720 Tobacco use: Secondary | ICD-10-CM | POA: Diagnosis present

## 2017-08-03 DIAGNOSIS — G40909 Epilepsy, unspecified, not intractable, without status epilepticus: Secondary | ICD-10-CM | POA: Diagnosis present

## 2017-08-03 DIAGNOSIS — D649 Anemia, unspecified: Secondary | ICD-10-CM

## 2017-08-03 DIAGNOSIS — G9340 Encephalopathy, unspecified: Secondary | ICD-10-CM | POA: Diagnosis present

## 2017-08-03 DIAGNOSIS — Z008 Encounter for other general examination: Secondary | ICD-10-CM

## 2017-08-03 LAB — COMPREHENSIVE METABOLIC PANEL
ALBUMIN: 3.8 g/dL (ref 3.5–5.0)
ALK PHOS: 105 U/L (ref 38–126)
ALT: 32 U/L (ref 0–44)
ANION GAP: 15 (ref 5–15)
AST: 13 U/L — ABNORMAL LOW (ref 15–41)
BILIRUBIN TOTAL: 0.8 mg/dL (ref 0.3–1.2)
BUN: 75 mg/dL — ABNORMAL HIGH (ref 6–20)
CALCIUM: 9.2 mg/dL (ref 8.9–10.3)
CO2: 26 mmol/L (ref 22–32)
Chloride: 117 mmol/L — ABNORMAL HIGH (ref 98–111)
Creatinine, Ser: 2.54 mg/dL — ABNORMAL HIGH (ref 0.61–1.24)
GFR calc Af Amer: 31 mL/min — ABNORMAL LOW (ref >60)
GFR calc non Af Amer: 27 mL/min — ABNORMAL LOW (ref >60)
GLUCOSE: 694 mg/dL — AB (ref 70–99)
Potassium: 4.2 mmol/L (ref 3.5–5.1)
Sodium: 158 mmol/L — ABNORMAL HIGH (ref 135–145)
TOTAL PROTEIN: 7.5 g/dL (ref 6.5–8.1)

## 2017-08-03 LAB — CBC WITH DIFFERENTIAL/PLATELET
BASOS PCT: 1 %
Basophils Absolute: 0 10*3/uL (ref 0.0–0.1)
EOS ABS: 0 10*3/uL (ref 0.0–0.7)
EOS PCT: 0 %
HCT: 38.3 % — ABNORMAL LOW (ref 39.0–52.0)
HEMOGLOBIN: 11.9 g/dL — AB (ref 13.0–17.0)
Lymphocytes Relative: 24 %
Lymphs Abs: 1.7 10*3/uL (ref 0.7–4.0)
MCH: 25.4 pg — ABNORMAL LOW (ref 26.0–34.0)
MCHC: 31.1 g/dL (ref 30.0–36.0)
MCV: 81.7 fL (ref 78.0–100.0)
MONOS PCT: 9 %
Monocytes Absolute: 0.6 10*3/uL (ref 0.1–1.0)
NEUTROS PCT: 66 %
Neutro Abs: 4.8 10*3/uL (ref 1.7–7.7)
Platelets: 348 10*3/uL (ref 150–400)
RBC: 4.69 MIL/uL (ref 4.22–5.81)
RDW: 13.5 % (ref 11.5–15.5)
WBC: 7.1 10*3/uL (ref 4.0–10.5)

## 2017-08-03 LAB — BLOOD GAS, VENOUS
ACID-BASE DEFICIT: 1.3 mmol/L (ref 0.0–2.0)
Bicarbonate: 23 mmol/L (ref 20.0–28.0)
O2 Saturation: 95.8 %
Patient temperature: 98.6
pCO2, Ven: 39.4 mmHg — ABNORMAL LOW (ref 44.0–60.0)
pH, Ven: 7.385 (ref 7.250–7.430)
pO2, Ven: 89.5 mmHg — ABNORMAL HIGH (ref 32.0–45.0)

## 2017-08-03 LAB — CBC
HCT: 38.1 % — ABNORMAL LOW (ref 39.0–52.0)
Hemoglobin: 11.8 g/dL — ABNORMAL LOW (ref 13.0–17.0)
MCH: 25.3 pg — ABNORMAL LOW (ref 26.0–34.0)
MCHC: 31 g/dL (ref 30.0–36.0)
MCV: 81.6 fL (ref 78.0–100.0)
Platelets: 360 10*3/uL (ref 150–400)
RBC: 4.67 MIL/uL (ref 4.22–5.81)
RDW: 13.5 % (ref 11.5–15.5)
WBC: 8.4 10*3/uL (ref 4.0–10.5)

## 2017-08-03 LAB — CBG MONITORING, ED
Glucose-Capillary: 569 mg/dL (ref 70–99)
Glucose-Capillary: >600 mg/dL (ref 70–99)

## 2017-08-03 LAB — URINALYSIS, ROUTINE W REFLEX MICROSCOPIC
Bacteria, UA: NONE SEEN
Bilirubin Urine: NEGATIVE
Ketones, ur: 5 mg/dL — AB
LEUKOCYTES UA: NEGATIVE
NITRITE: NEGATIVE
Protein, ur: NEGATIVE mg/dL
SPECIFIC GRAVITY, URINE: 1.026 (ref 1.005–1.030)
pH: 5 (ref 5.0–8.0)

## 2017-08-03 LAB — RAPID URINE DRUG SCREEN, HOSP PERFORMED
Amphetamines: NOT DETECTED
Barbiturates: NOT DETECTED
Benzodiazepines: NOT DETECTED
Cocaine: NOT DETECTED
OPIATES: NOT DETECTED
Tetrahydrocannabinol: NOT DETECTED

## 2017-08-03 LAB — CREATININE, SERUM
CREATININE: 2.23 mg/dL — AB (ref 0.61–1.24)
GFR calc Af Amer: 36 mL/min — ABNORMAL LOW (ref >60)
GFR, EST NON AFRICAN AMERICAN: 31 mL/min — AB (ref >60)

## 2017-08-03 LAB — AMMONIA: Ammonia: 24 umol/L (ref 9–35)

## 2017-08-03 LAB — TSH: TSH: 0.341 u[IU]/mL — AB (ref 0.350–4.500)

## 2017-08-03 LAB — I-STAT CG4 LACTIC ACID, ED
LACTIC ACID, VENOUS: 2.08 mmol/L — AB (ref 0.5–1.9)
LACTIC ACID, VENOUS: 1.49 mmol/L (ref 0.5–1.9)

## 2017-08-03 LAB — GLUCOSE, CAPILLARY: Glucose-Capillary: 503 mg/dL (ref 70–99)

## 2017-08-03 LAB — CK: Total CK: 55 U/L (ref 49–397)

## 2017-08-03 LAB — ETHANOL: Alcohol, Ethyl (B): <10 mg/dL (ref <10)

## 2017-08-03 LAB — TROPONIN I: Troponin I: 0.05 ng/mL (ref <0.03)

## 2017-08-03 MED ORDER — LACTATED RINGERS IV SOLN
INTRAVENOUS | Status: DC
  Administered 2017-08-03: 17:00:00 via INTRAVENOUS

## 2017-08-03 MED ORDER — ASPIRIN 325 MG PO TABS
325.0000 mg | ORAL_TABLET | Freq: Every day | ORAL | Status: DC
  Administered 2017-08-04 – 2017-08-16 (×13): 325 mg via ORAL
  Filled 2017-08-03 (×12): qty 1

## 2017-08-03 MED ORDER — FERROUS SULFATE 325 (65 FE) MG PO TABS
325.0000 mg | ORAL_TABLET | Freq: Every day | ORAL | Status: DC
  Administered 2017-08-04 – 2017-08-16 (×13): 325 mg via ORAL
  Filled 2017-08-03 (×13): qty 1

## 2017-08-03 MED ORDER — THIAMINE HCL 100 MG/ML IJ SOLN
Freq: Once | INTRAVENOUS | Status: AC
  Administered 2017-08-04: via INTRAVENOUS
  Filled 2017-08-03: qty 1000

## 2017-08-03 MED ORDER — INSULIN ASPART PROT & ASPART (70-30 MIX) 100 UNIT/ML ~~LOC~~ SUSP
30.0000 [IU] | Freq: Two times a day (BID) | SUBCUTANEOUS | Status: DC
  Administered 2017-08-04: 30 [IU] via SUBCUTANEOUS
  Filled 2017-08-03: qty 10

## 2017-08-03 MED ORDER — SODIUM CHLORIDE 0.9 % IV BOLUS
500.0000 mL | Freq: Once | INTRAVENOUS | Status: AC
  Administered 2017-08-03: 500 mL via INTRAVENOUS

## 2017-08-03 MED ORDER — ATORVASTATIN CALCIUM 40 MG PO TABS
40.0000 mg | ORAL_TABLET | Freq: Every day | ORAL | Status: DC
  Administered 2017-08-04 – 2017-08-16 (×13): 40 mg via ORAL
  Filled 2017-08-03 (×13): qty 1

## 2017-08-03 MED ORDER — HEPARIN SODIUM (PORCINE) 5000 UNIT/ML IJ SOLN
5000.0000 [IU] | Freq: Three times a day (TID) | INTRAMUSCULAR | Status: DC
  Administered 2017-08-04 – 2017-08-16 (×38): 5000 [IU] via SUBCUTANEOUS
  Filled 2017-08-03 (×38): qty 1

## 2017-08-03 MED ORDER — INSULIN ASPART 100 UNIT/ML ~~LOC~~ SOLN
15.0000 [IU] | Freq: Once | SUBCUTANEOUS | Status: AC
  Administered 2017-08-04: 15 [IU] via SUBCUTANEOUS

## 2017-08-03 MED ORDER — METOPROLOL TARTRATE 25 MG PO TABS
12.5000 mg | ORAL_TABLET | Freq: Two times a day (BID) | ORAL | Status: DC
  Administered 2017-08-04 – 2017-08-16 (×25): 12.5 mg via ORAL
  Filled 2017-08-03 (×26): qty 1

## 2017-08-03 MED ORDER — AMLODIPINE BESYLATE 10 MG PO TABS
10.0000 mg | ORAL_TABLET | Freq: Every day | ORAL | Status: DC
  Administered 2017-08-04 – 2017-08-16 (×13): 10 mg via ORAL
  Filled 2017-08-03 (×13): qty 1

## 2017-08-03 MED ORDER — INSULIN ASPART 100 UNIT/ML ~~LOC~~ SOLN
10.0000 [IU] | Freq: Once | SUBCUTANEOUS | Status: AC
  Administered 2017-08-03: 10 [IU] via SUBCUTANEOUS
  Filled 2017-08-03: qty 1

## 2017-08-03 NOTE — ED Notes (Signed)
Bed: [id number] Expected date:  Expected time:  Means of arrival:  Comments: 57 yo found under bush, hyperglycemia

## 2017-08-03 NOTE — ED Triage Notes (Signed)
Pt BIBA. Pt found in a new area. Pt was lying under a bush- bystander concerned he was dead as he had been there for 3 days. Pt ambulatory to EMS. Pt is looking worse than normal. Pt states nothing to eat or drink in 2-3 days or more. CBG 569 upon arrival.

## 2017-08-03 NOTE — H&P (Signed)
History and Physical    [place] EAV:[id number] DOB: 1960-05-11 DOA: 08/03/2017  Referring MD/NP/PA: Dr. Laverta Baltimore  PCP: Synthia Innocent Audrea Muscat, NP   Outpatient Specialists: None   Patient coming from: Found laying down in the bushes  Chief Complaint: Altered mental status and dehydration  HPI: Mike [name] is a 57 y.o. male with medical history significant of diabetes with documented non compliance  who has apparently been homeless and was found in the bushes where he has laid down for unknown period of time.  Suspected to be at least 3 days.  A bystander noticed him over there and thought it was a dead body so he called the cops.  Patient was brought to the ER where he was found to have blood sugar of more than 600.  He was extremely dehydrated weak but awake.  Patient received immediate fluid resuscitation and IV insulin.  Blood sugar has improved but he remains weak tired and has elevated blood sugar.  He is therefore being admitted to the hospital for evaluation and treatment.  ED Course: Patient received IV normal saline bolus followed by Ringer's lactate.  Initial temperature was 98 7 with blood pressure 178/148, pulse of 88 and sats 98%.  Blood sugar was 694.  Sodium 158 BUN 75 and creatinine of 2.23.  A diagnosis of acute kidney injury, severe dehydration as well as nonketotic hyperglycemia was made.  Anion gap was not elevated.  Review of Systems: As per HPI otherwise 10 point review of systems negative.    Past Medical History:  Diagnosis Date  . Anxiety   . Anxiety   . Chronic lower back pain   . Depression   . DKA (diabetic ketoacidoses) ([place]) 07/30/2016  . Hyperlipemia   . Hypertension   . Migraine    "last one was ~ 4 yr ago" (12/23/2014)  . Seizures ([place])    "related to pills for anxiety; if I don't take the pills I'm suppose to take I'll have them" (12/23/2014)  . Type II diabetes mellitus ([place])     Past Surgical History:  Procedure Laterality Date  . NO PAST  SURGERIES       reports that he has been smoking cigarettes.  He has a 4.00 pack-year smoking history. He has never used smokeless tobacco. He reports that he drinks about 1.2 oz of alcohol per week. He reports that he does not use drugs.  Allergies  Allergen Reactions  . Ibuprofen Nausea And Vomiting and Other (See Comments)    Makes the patient feel bloated also  . Tylenol [Acetaminophen] Nausea And Vomiting and Other (See Comments)    Makes the patient feel bloated also    Family History  Problem Relation Age of Onset  . Diabetes Mellitus II Mother   . Diabetes Mellitus II Father     Prior to Admission medications   Medication Sig Start Date End Date Taking? Authorizing Provider  amLODipine (NORVASC) 10 MG tablet Take 1 tablet (10 mg total) by mouth daily. 05/28/17  Yes Dessa Phi, DO  aspirin 325 MG tablet Take 1 tablet (325 mg total) by mouth daily. 03/08/17  Yes Georgette Shell, MD  atorvastatin (LIPITOR) 40 MG tablet Take 1 tablet (40 mg total) by mouth daily. 01/26/17  Yes Cardama, Grayce Sessions, MD  insulin aspart protamine- aspart (NOVOLOG MIX 70/30) (70-30) 100 UNIT/ML injection Inject 0.3 mLs (30 Units total) into the skin 2 (two) times daily with a meal. 04/30/17  Yes Dorie Rank, MD  lisinopril (  PRINIVIL,ZESTRIL) 40 MG tablet Take 1 tablet (40 mg total) by mouth daily. 04/30/17  Yes Dorie Rank, MD  metoprolol tartrate (LOPRESSOR) 25 MG tablet Take 0.5 tablets (12.5 mg total) by mouth 2 (two) times daily. 03/07/17  Yes Georgette Shell, MD  blood glucose meter kit and supplies KIT Dispense based on patient and insurance preference. Use up to four times daily as directed. (FOR ICD-9 250.00, 250.01). 11/22/16   Velvet Bathe, MD  ferrous sulfate 325 (65 FE) MG tablet Take 1 tablet (325 mg total) daily by mouth. Patient taking differently: Take 325 mg by mouth daily as needed (low iron).  11/23/16   Ward, Delice Bison, DO    Physical Exam: Vitals:   08/03/17 1603 08/03/17  1630 08/03/17 1700 08/03/17 1730  BP: (!) 171/95 (!) 172/93 (!) 169/99 (!) 168/94  Pulse: 86 85 83 81  Resp: '14 14 14 14  '$ Temp:      TempSrc:      SpO2: 100% 100% 100% 100%  Weight:      Height:          Constitutional: Cachectic, chronically ill looking and very dehydrated Vitals:   08/03/17 1603 08/03/17 1630 08/03/17 1700 08/03/17 1730  BP: (!) 171/95 (!) 172/93 (!) 169/99 (!) 168/94  Pulse: 86 85 83 81  Resp: '14 14 14 14  '$ Temp:      TempSrc:      SpO2: 100% 100% 100% 100%  Weight:      Height:       Eyes: PERRL, lids and conjunctivae dry ENMT: Mucous membranes are dry Posterior pharynx clear of any exudate or lesions.Normal dentition.  Neck: normal, supple, no masses, no thyromegaly Respiratory: clear to auscultation bilaterally, no wheezing, no crackles. Normal respiratory effort. No accessory muscle use.  Cardiovascular: Regular rate and rhythm, no murmurs / rubs / gallops. No extremity edema. 2+ pedal pulses. No carotid bruits.  Abdomen: no tenderness, no masses palpated. No hepatosplenomegaly. Bowel sounds positive.  Musculoskeletal: no clubbing / cyanosis. No joint deformity upper and lower extremities. Good ROM, no contractures. Normal muscle tone.  Skin: no rashes, lesions, ulcers. No induration Neurologic: CN 2-12 grossly intact. Sensation intact, DTR normal. Strength 5/5 in all 4.  Psychiatric: Normal judgment and insight. Alert and oriented x 3. Normal mood.   Labs on Admission: I have personally reviewed following labs and imaging studies  CBC: Recent Labs  Lab 08/03/17 1545  WBC 7.1  NEUTROABS 4.8  HGB 11.9*  HCT 38.3*  MCV 81.7  PLT 945   Basic Metabolic Panel: Recent Labs  Lab 08/03/17 1545  NA 158*  K 4.2  CL 117*  CO2 26  GLUCOSE 694*  BUN 75*  CREATININE 2.54*  CALCIUM 9.2   GFR: Estimated Creatinine Clearance: 33.7 mL/min (A) (by C-G formula based on SCr of 2.54 mg/dL (H)). Liver Function Tests: Recent Labs  Lab 08/03/17 1545    AST 13*  ALT 32  ALKPHOS 105  BILITOT 0.8  PROT 7.5  ALBUMIN 3.8   No results for input(s): LIPASE, AMYLASE in the last 168 hours. Recent Labs  Lab 08/03/17 1546  AMMONIA 24   Coagulation Profile: No results for input(s): INR, PROTIME in the last 168 hours. Cardiac Enzymes: Recent Labs  Lab 08/03/17 1545  CKTOTAL 55   BNP (last 3 results) No results for input(s): PROBNP in the last 8760 hours. HbA1C: No results for input(s): HGBA1C in the last 72 hours. CBG: Recent Labs  Lab 08/03/17 1526  08/03/17 1714  GLUCAP 569* >600*   Lipid Profile: No results for input(s): CHOL, HDL, LDLCALC, TRIG, CHOLHDL, LDLDIRECT in the last 72 hours. Thyroid Function Tests: No results for input(s): TSH, T4TOTAL, FREET4, T3FREE, THYROIDAB in the last 72 hours. Anemia Panel: No results for input(s): VITAMINB12, FOLATE, FERRITIN, TIBC, IRON, RETICCTPCT in the last 72 hours. Urine analysis:    Component Value Date/Time   COLORURINE COLORLESS (A) 05/28/2017 [place] 05/28/2017 1748   LABSPEC 1.023 05/28/2017 1748   PHURINE 6.0 05/28/2017 1748   GLUCOSEU >=500 (A) 05/28/2017 1748   HGBUR NEGATIVE 05/28/2017 [place] 05/28/2017 1748   KETONESUR NEGATIVE 05/28/2017 1748   PROTEINUR NEGATIVE 05/28/2017 1748   UROBILINOGEN 1.0 02/07/2011 1605   NITRITE NEGATIVE 05/28/2017 1748   LEUKOCYTESUR NEGATIVE 05/28/2017 1748   Sepsis Labs: '@LABRCNTIP'$ (procalcitonin:4,lacticidven:4) )No results found for this or any previous visit (from the past 240 hour(s)).   Radiological Exams on Admission: Ct Head Wo Contrast  Result Date: 08/03/2017 CLINICAL DATA:  Altered level of consciousness.  Hyperglycemia EXAM: CT HEAD WITHOUT CONTRAST TECHNIQUE: Contiguous axial images were obtained from the base of the skull through the vertex without intravenous contrast. COMPARISON:  06/16/2017 FINDINGS: Brain: The brainstem, cerebellum, cerebral peduncles, thalami, basal ganglia,  and basilar cisterns appear normal. Mild prominence of the temporal horns of the lateral ventricles, stable. Cerebral atrophy. Periventricular white matter and corona radiata hypodensities favor chronic ischemic microvascular white matter disease. No intracranial hemorrhage, mass lesion, or acute CVA. Vascular: Unremarkable Skull: Unremarkable Sinuses/Orbits: Chronic right maxillary sinusitis. Other: No supplemental non-categorized findings. IMPRESSION: 1. Advanced for age cerebral atrophy. There is continued mild prominence of the temporal horns of the lateral ventricles suggesting mild communicating hydrocephalus. 2. Periventricular white matter and corona radiata hypodensities favor chronic ischemic microvascular white matter disease. 3. Chronic right maxillary sinusitis. 4. No acute findings. Electronically Signed   By: Van Clines M.D.   On: 08/03/2017 16:12     Assessment/Plan Principal Problem:   Uncontrolled type 2 diabetes mellitus with hyperosmolar nonketotic hyperglycemia (HCC) Active Problems:   Homelessness   Tobacco abuse   Essential hypertension   Type 2 diabetes mellitus with hyperosmolar nonketotic hyperglycemia ([place])     #1 hyperglycemia: Patient has hyperosmolar nonketotic hyperglycemia: We will admit and start aggressive IV hydration.  Blood sugar has improved with initial IV insulin.  Patient probably is noncompliant.  Resume his home regimen of insulin with sliding scale and monitor response closely.  #2 hypertension: Also noncompliant with medications. Resume scheduled home medications  #3 acute kidney injury: Secondary to severe dehydration.  Continue aggressive hydration  #4 hypernatremia: Secondary to dehydration.  Continue with Ringer's lactate.  #5 severe protein calorie malnutrition: Nutrition consult and encourage high-protein intake  #6 homelessness: Hotel manager.    DVT prophylaxis: Heparin Code Status: Full code Family  Communication: No family available discussed with patient Disposition Plan: To be determined Consults called: None Admission status: Inpatient  Severity of Illness: The appropriate patient status for this patient is INPATIENT. Inpatient status is judged to be reasonable and necessary in order to provide the required intensity of service to ensure the patient's safety. The patient's presenting symptoms, physical exam findings, and initial radiographic and laboratory data in the context of their chronic comorbidities is felt to place them at high risk for further clinical deterioration. Furthermore, it is not anticipated that the patient will be medically stable for discharge from the hospital within 2 midnights of admission. The  following factors support the patient status of inpatient.   " The patient's presenting symptoms include weakness with hypoglycemia. " The worrisome physical exam findings include cachexia and severe dehydration and dry mucous membrane. " The initial radiographic and laboratory data are worrisome because of blood sugar of almost 700. " The chronic co-morbidities include uncontrolled diabetes.   * I certify that at the point of admission it is my clinical judgment that the patient will require inpatient hospital care spanning beyond 2 midnights from the point of admission due to high intensity of service, high risk for further deterioration and high frequency of surveillance required.Barbette Merino MD [facility] Pager [phone]  If 7PM-7AM, please contact night-coverage [email or web address] Password [facility]  08/03/2017, 5:45 PM

## 2017-08-03 NOTE — ED Provider Notes (Signed)
Emergency Department Provider Note   I have reviewed the triage vital signs and the nursing notes.   HISTORY  Chief Complaint Hyperglycemia   HPI Mike [name] is a 57 y.o. male with PMH of IDDM with [facility] of med non-compliance, HTN, HLD, and seizure disorder presents to the emergency department by EMS when a bystander called in for possible dead body.  The patient had apparently been lying just to the side of the path, under a bush for approximately the last 3 days.  The path is apparently well traveled and patient had been seen, not moving for several days.  Finally, today the paramedics were called and found the patient to be poorly responsive.  He had no apparent injuries and there are initial blood sugar read "high."  He was given 800 mL's of normal saline in route.   Level 5 caveat: Acute encephalopathy and confusion. Patient unable to provide significant history.   Past Medical History:  Diagnosis Date  . Anxiety   . Anxiety   . Chronic lower back pain   . Depression   . DKA (diabetic ketoacidoses) (HCC) 07/30/2016  . Hyperlipemia   . Hypertension   . Migraine    "last one was ~ 4 yr ago" (12/23/2014)  . Seizures (HCC)    "related to pills for anxiety; if I don't take the pills I'm suppose to take I'll have them" (12/23/2014)  . Type II diabetes mellitus [facility](HCC)     Patient Active Problem List   Diagnosis Date Noted  . Type 2 diabetes mellitus with hyperosmolar nonketotic hyperglycemia (HCC) 08/03/2017  . Hyperglycemia 05/26/2017  . Noncompliance 05/26/2017  . AKI (acute kidney injury) (HCC) 03/04/2017  . Malnutrition of moderate degree 02/08/2017  . DKA (diabetic ketoacidoses) (HCC) 02/07/2017  . History of stroke 02/07/2017  . Hyperphosphatemia 12/13/2016  . Evaluation by psychiatric service required 11/30/2016  . Lactic acidosis 11/18/2016  . Normocytic anemia 11/06/2016  . HLD (hyperlipidemia) 11/06/2016  . Adjustment disorder with other symptoms  11/05/2016  . Uncontrolled type 2 diabetes mellitus with hyperosmolar nonketotic hyperglycemia (HCC) 08/24/2016  . Essential hypertension 08/24/2016  . Acute ischemic stroke (HCC)   . Protein-calorie malnutrition, severe 12/24/2014  . Homelessness 12/23/2014  . Hypertensive urgency 12/23/2014  . DM (diabetes mellitus) (HCC) 12/23/2014  . Intermittent palpitations 12/23/2014  . Tobacco abuse 12/23/2014  . Pleuritic chest pain 12/23/2014  . Abnormal EKG 12/23/2014  . Type II diabetes mellitus with renal manifestations [facility](HCC)     Past Surgical History:  Procedure Laterality Date  . NO PAST SURGERIES      Allergies Ibuprofen and Tylenol [acetaminophen]  Family History  Problem Relation Age of Onset  . Diabetes Mellitus II Mother   . Diabetes Mellitus II Father     Social History Social History   Tobacco Use  . Smoking status: Current Every Day Smoker    Packs/day: 0.10    Years: 40.00    Pack years: 4.00    Types: Cigarettes  . Smokeless tobacco: Never Used  Substance Use Topics  . Alcohol use: Yes    Alcohol/week: 1.2 oz    Types: 2 Cans of beer per week    Comment: No labs available at time of assessment  . Drug use: No    Comment: 12/23/2014 "stopped ~ 10 yrs ago"    Review of Systems  Level 5 caveat: Encephalopathy and acute AMS.  ____________________________________________   PHYSICAL EXAM:  VITAL SIGNS: Vitals:   08/03/17 1830 08/03/17 2007  BP: (!) 148/88 (!) 165/93  Pulse: 81 69  Resp: 14 16  Temp:  97.8 F (36.6 C)  SpO2: 100% 98%    Constitutional: Appears drowsy but eyes are open. Following commands but mumbling speech. Smells of urine and is very thin.  Eyes: Conjunctivae are normal. PERRL. Head: Atraumatic. Nose: No congestion/rhinnorhea. Mouth/Throat: Mucous membranes are very dry.  Neck: No stridor.   Cardiovascular: Normal rate, regular rhythm. Good peripheral circulation. Grossly normal heart sounds.   Respiratory: Normal  respiratory effort.  No retractions. Lungs CTAB. Gastrointestinal: Soft and nontender. No distention.  Musculoskeletal: No lower extremity tenderness nor edema. No gross deformities of extremities. Neurologic: No gross focal neurologic deficits are appreciated.  Skin:  Skin is warm, dry and intact. No rash noted.  ____________________________________________   LABS (all labs ordered are listed, but only abnormal results are displayed)  Labs Reviewed  COMPREHENSIVE METABOLIC PANEL - Abnormal; Notable for the following components:      Result Value   Sodium 158 (*)    Chloride 117 (*)    Glucose, Bld 694 (*)    BUN 75 (*)    Creatinine, Ser 2.54 (*)    AST 13 (*)    GFR calc non Af Amer 27 (*)    GFR calc Af Amer 31 (*)    All other components within normal limits  CBC WITH DIFFERENTIAL/PLATELET - Abnormal; Notable for the following components:   Hemoglobin 11.9 (*)    HCT 38.3 (*)    MCH 25.4 (*)    All other components within normal limits  URINALYSIS, ROUTINE W REFLEX MICROSCOPIC - Abnormal; Notable for the following components:   Glucose, UA >=500 (*)    Hgb urine dipstick SMALL (*)    Ketones, ur 5 (*)    All other components within normal limits  BLOOD GAS, VENOUS - Abnormal; Notable for the following components:   pCO2, Ven 39.4 (*)    pO2, Ven 89.5 (*)    All other components within normal limits  CBC - Abnormal; Notable for the following components:   Hemoglobin 11.8 (*)    HCT 38.1 (*)    MCH 25.3 (*)    All other components within normal limits  CREATININE, SERUM - Abnormal; Notable for the following components:   Creatinine, Ser 2.23 (*)    GFR calc non Af Amer 31 (*)    GFR calc Af Amer 36 (*)    All other components within normal limits  TSH - Abnormal; Notable for the following components:   TSH 0.341 (*)    All other components within normal limits  TROPONIN I - Abnormal; Notable for the following components:   Troponin I 0.05 (*)    All other  components within normal limits  I-STAT CG4 LACTIC ACID, ED - Abnormal; Notable for the following components:   Lactic Acid, Venous 2.08 (*)    All other components within normal limits  CBG MONITORING, ED - Abnormal; Notable for the following components:   Glucose-Capillary 569 (*)    All other components within normal limits  CBG MONITORING, ED - Abnormal; Notable for the following components:   Glucose-Capillary >600 (*)    All other components within normal limits  ETHANOL  RAPID URINE DRUG SCREEN, HOSP PERFORMED  AMMONIA  CK  COMPREHENSIVE METABOLIC PANEL  CBC  TROPONIN I  TROPONIN I  HEMOGLOBIN A1C  I-STAT CG4 LACTIC ACID, ED   ____________________________________________  EKG   EKG Interpretation  Date/Time:  Friday August 03 2017 15:32:14 EDT Ventricular Rate:  88 PR Interval:    QRS Duration: 87 QT Interval:  372 QTC Calculation: 451 R Axis:   42 Text Interpretation:  Sinus rhythm Probable left atrial enlargement Left ventricular hypertrophy Abnrm T, probable ischemia, anterolateral lds No STEMI.  Confirmed by Alona Bene [phone]) on 08/03/2017 3:38:28 PM       ____________________________________________  RADIOLOGY  Ct Head Wo Contrast  Result Date: 08/03/2017 CLINICAL DATA:  Altered level of consciousness.  Hyperglycemia EXAM: CT HEAD WITHOUT CONTRAST TECHNIQUE: Contiguous axial images were obtained from the base of the skull through the vertex without intravenous contrast. COMPARISON:  06/16/2017 FINDINGS: Brain: The brainstem, cerebellum, cerebral peduncles, thalami, basal ganglia, and basilar cisterns appear normal. Mild prominence of the temporal horns of the lateral ventricles, stable. Cerebral atrophy. Periventricular white matter and corona radiata hypodensities favor chronic ischemic microvascular white matter disease. No intracranial hemorrhage, mass lesion, or acute CVA. Vascular: Unremarkable Skull: Unremarkable Sinuses/Orbits: Chronic right maxillary  sinusitis. Other: No supplemental non-categorized findings. IMPRESSION: 1. Advanced for age cerebral atrophy. There is continued mild prominence of the temporal horns of the lateral ventricles suggesting mild communicating hydrocephalus. 2. Periventricular white matter and corona radiata hypodensities favor chronic ischemic microvascular white matter disease. 3. Chronic right maxillary sinusitis. 4. No acute findings. Electronically Signed   By: Gaylyn Rong M.D.   On: 08/03/2017 16:12    ____________________________________________   PROCEDURES  Procedure(s) performed:   Procedures  CRITICAL CARE Performed by: Maia Plan Total critical care time: 35 minutes Critical care time was exclusive of separately billable procedures and treating other patients. Critical care was necessary to treat or prevent imminent or life-threatening deterioration. Critical care was time spent personally by me on the following activities: development of treatment plan with patient and/or surrogate as well as nursing, discussions with consultants, evaluation of patient's response to treatment, examination of patient, obtaining history from patient or surrogate, ordering and performing treatments and interventions, ordering and review of laboratory studies, ordering and review of radiographic studies, pulse oximetry and re-evaluation of patient's condition.  Alona Bene, MD Emergency Medicine  ____________________________________________   INITIAL IMPRESSION / ASSESSMENT AND PLAN / ED COURSE  Pertinent labs & imaging results that were available during my care of the patient were reviewed by me and considered in my medical decision making (see chart for details).  Patient arrived by EMS for evaluation of acute altered mental status.  Evette Cristal has a Mirenda Baltazar history of noncompliance with diabetes medication as well as seizure medication.  Patient is disheveled and smells of urine.  He is very thin.  He is alert  but unable to provide significant history.  I do not appreciate any focal neurological deficits.  My initial evaluation the patient's vital signs are largely unremarkable.  His initial blood glucose on arrival is greater than 500 after being given 800 mL's of normal saline with EMS.  Patient's EKG on arrival does have changes which appear to be significant including T wave inversions in the anterior lateral leads.  No STEMI.  Suspect myocardial injury secondary to other underlying etiology.  Labs pending.  Will obtain CT head with lower suspicion for head trauma or stroke but exam is limited.  Patient with hyperglycemia and AKI on labs. No DKA. CT head reviewed with no acute findings. Continue IVF and suspect pre-renal AKI clinically. Plan for admit. Normal CK. No UTI.   Discussed patient's case with Hospitalist, Dr. Mikeal Hawthorne to request admission. Patient and  family (if present) updated with plan. Care transferred to Hospitalist service.  I reviewed all nursing notes, vitals, pertinent old records, EKGs, labs, imaging (as available).  ____________________________________________  FINAL CLINICAL IMPRESSION(S) / ED DIAGNOSES  Final diagnoses:  AKI (acute kidney injury) (HCC)  Severe dehydration  Disorientation     MEDICATIONS GIVEN DURING THIS VISIT:  Medications  lactated ringers infusion ( Intravenous New Bag/Given 08/03/17 1720)  ferrous sulfate tablet 325 mg (has no administration in time range)  atorvastatin (LIPITOR) tablet 40 mg (has no administration in time range)  aspirin tablet 325 mg (has no administration in time range)  metoprolol tartrate (LOPRESSOR) tablet 12.5 mg (has no administration in time range)  insulin aspart protamine- aspart (NOVOLOG MIX 70/30) injection 30 Units (has no administration in time range)  amLODipine (NORVASC) tablet 10 mg (has no administration in time range)  heparin injection 5,000 Units (has no administration in time range)  sodium chloride 0.9 %  1,000 mL with thiamine 100 mg, folic acid 1 mg, multivitamins adult 10 mL infusion (has no administration in time range)  sodium chloride 0.9 % bolus 500 mL (0 mLs Intravenous Stopped 08/03/17 1704)  insulin aspart (novoLOG) injection 10 Units (10 Units Subcutaneous Given 08/03/17 1714)    Note:  This document was prepared using Dragon voice recognition software and may include unintentional dictation errors.  Alona Bene, MD Emergency Medicine    Doren Kaspar, Arlyss Repress, MD 08/03/17 [phone]

## 2017-08-03 NOTE — ED Notes (Signed)
Date and time results received: 08/03/17 1659 (use smartphrase ".now" to insert current time)  Test: Glucose Critical Value: 694  Name of Provider Notified: Long  Orders Received? Or Actions Taken?: Actions Taken: Notified Dr Jacqulyn BathLong and Primary RN

## 2017-08-03 NOTE — ED Notes (Signed)
Attempted report 

## 2017-08-03 NOTE — ED Notes (Signed)
ED TO INPATIENT HANDOFF REPORT  Name/Age/Gender Mike [name] 57 y.o. male  Code Status Code Status History    Date Active Date Inactive Code Status Order ID Comments User Context   05/26/2017 2150 05/27/2017 2010 Full Code [id number]  Phillips Grout, MD ED   03/05/2017 0003 03/07/2017 1814 Full Code [id number]  Vianne Bulls, MD ED   02/07/2017 1830 02/13/2017 2135 Full Code [id number]  Mariel Aloe, MD ED   11/26/2016 0309 12/22/2016 1723 Full Code [id number]  Vianne Bulls, MD ED   11/18/2016 0317 11/22/2016 2208 Full Code [id number]  Etta Quill, DO ED   11/06/2016 1830 11/08/2016 1706 Full Code [id number]  Ivor Costa, MD ED   11/05/2016 0535 11/05/2016 1638 Full Code [id number]  Delora Fuel, MD ED   11/04/2016 0107 11/04/2016 2112 Full Code [id number]  Gwynne Edinger, MD ED   10/22/2016 1407 10/27/2016 1710 Full Code [id number]  Rondel Jumbo, PA-C ED   08/24/2016 1945 08/25/2016 1832 Full Code [id number]  Ivor Costa, MD ED   07/31/2016 0329 08/06/2016 1858 Full Code [id number]  Ivor Costa, MD ED   06/24/2016 1604 06/28/2016 1833 Full Code [id number]  [name], NP ED   12/23/2014 0803 12/26/2014 1756 Full Code [id number]  [facility]      Home/SNF/Other Home  Chief Complaint Hyperglycemia  Level of Care/Admitting Diagnosis ED Disposition    None      Medical History Past Medical History:  Diagnosis Date  . Anxiety   . Anxiety   . Chronic lower back pain   . Depression   . DKA (diabetic ketoacidoses) ([place]) 07/30/2016  . Hyperlipemia   . Hypertension   . Migraine    "last one was ~ 4 yr ago" (12/23/2014)  . Seizures ([place])    "related to pills for anxiety; if I don't take the pills I'm suppose to take I'll have them" (12/23/2014)  . Type II diabetes mellitus (HCC)     Allergies Allergies  Allergen Reactions  . Ibuprofen Nausea And Vomiting and Other (See Comments)    Makes the patient feel bloated also  . Tylenol [Acetaminophen] Nausea And  Vomiting and Other (See Comments)    Makes the patient feel bloated also    IV Location/Drains/Wounds Patient Lines/Drains/Airways Status   Active Line/Drains/Airways    Name:   Placement date:   Placement time:   Site:   Days:   Peripheral IV 08/03/17 Left Forearm   08/03/17    -    Forearm   less than 1   External Urinary Catheter   08/03/17    1720    -   less than 1          Labs/Imaging Results for orders placed or performed during the hospital encounter of 08/03/17 (from the past 48 hour(s))  CBG monitoring, ED     Status: Abnormal   Collection Time: 08/03/17  3:26 PM  Result Value Ref Range   Glucose-Capillary 569 (HH) 70 - 99 mg/dL  Comprehensive metabolic panel     Status: Abnormal   Collection Time: 08/03/17  3:45 PM  Result Value Ref Range   Sodium 158 (H) 135 - 145 mmol/L   Potassium 4.2 3.5 - 5.1 mmol/L   Chloride 117 (H) 98 - 111 mmol/L    Comment: Please note change in reference range.   CO2 26 22 - 32 mmol/L   Glucose, Bld 694 (HH) 70 - 99 mg/dL  Comment: Please note change in reference range. CRITICAL RESULT CALLED TO, READ BACK BY AND VERIFIED WITH: SUSAN CLAPP,RN [id number] @ 1658 BY J SCOTTON    BUN 75 (H) 6 - 20 mg/dL    Comment: Please note change in reference range.   Creatinine, Ser 2.54 (H) 0.61 - 1.24 mg/dL   Calcium 9.2 8.9 - 10.3 mg/dL   Total Protein 7.5 6.5 - 8.1 g/dL   Albumin 3.8 3.5 - 5.0 g/dL   AST 13 (L) 15 - 41 U/L   ALT 32 0 - 44 U/L    Comment: Please note change in reference range.   Alkaline Phosphatase 105 38 - 126 U/L   Total Bilirubin 0.8 0.3 - 1.2 mg/dL   GFR calc non Af Amer 27 (L) >60 mL/min   GFR calc Af Amer 31 (L) >60 mL/min    Comment: (NOTE) The eGFR has been calculated using the CKD EPI equation. This calculation has not been validated in all clinical situations. eGFR's persistently <60 mL/min signify possible Chronic Kidney Disease.    Anion gap 15 5 - 15    Comment: Performed at [facility],  [place] [address] [place], [place] [address]  CBC with Differential     Status: Abnormal   Collection Time: 08/03/17  3:45 PM  Result Value Ref Range   WBC 7.1 4.0 - 10.5 K/uL   RBC 4.69 4.22 - 5.81 MIL/uL   Hemoglobin 11.9 (L) 13.0 - 17.0 g/dL   HCT 38.3 (L) 39.0 - 52.0 %   MCV 81.7 78.0 - 100.0 fL   MCH 25.4 (L) 26.0 - 34.0 pg   MCHC 31.1 30.0 - 36.0 g/dL   RDW 13.5 11.5 - 15.5 %   Platelets 348 150 - 400 K/uL   Neutrophils Relative % 66 %   Neutro Abs 4.8 1.7 - 7.7 K/uL   Lymphocytes Relative 24 %   Lymphs Abs 1.7 0.7 - 4.0 K/uL   Monocytes Relative 9 %   Monocytes Absolute 0.6 0.1 - 1.0 K/uL   Eosinophils Relative 0 %   Eosinophils Absolute 0.0 0.0 - 0.7 K/uL   Basophils Relative 1 %   Basophils Absolute 0.0 0.0 - 0.1 K/uL    Comment: Performed at [facility], [place] [address], [place], [place] [address]  CK     Status: None   Collection Time: 08/03/17  3:45 PM  Result Value Ref Range   Total CK 55 49 - 397 U/L    Comment: Performed at [facility], [place] [address], [place], [place] [address]  Ammonia     Status: None   Collection Time: 08/03/17  3:46 PM  Result Value Ref Range   Ammonia 24 9 - 35 umol/L    Comment: Performed at [facility], [place] [address], [place], [place] [address]  Ethanol     Status: None   Collection Time: 08/03/17  3:47 PM  Result Value Ref Range   Alcohol, Ethyl (B) <10 <10 mg/dL    Comment: (NOTE) Lowest detectable limit for serum alcohol is 10 mg/dL. For medical purposes only. Performed at [facility], [place] [address], [place], [place] [address]   I-Stat CG4 Lactic Acid, ED     Status: Abnormal   Collection Time: 08/03/17  3:52 PM  Result Value Ref Range   Lactic Acid, Venous 2.08 (HH) 0.5 - 1.9 mmol/L   Comment NOTIFIED PHYSICIAN   Blood gas, venous  Status: Abnormal   Collection Time: 08/03/17  4:10 PM  Result Value Ref Range   pH, Ven 7.385 7.250 - 7.430    pCO2, Ven 39.4 (L) 44.0 - 60.0 mmHg   pO2, Ven 89.5 (H) 32.0 - 45.0 mmHg   Bicarbonate 23.0 20.0 - 28.0 mmol/L   Acid-base deficit 1.3 0.0 - 2.0 mmol/L   O2 Saturation 95.8 %   Patient temperature 98.6    Collection site VEIN    Drawn by COLLECTED BY NURSE    Sample type VENOUS     Comment: Performed at [facility], [place] [address] [place], [place] [address]  CBG monitoring, ED     Status: Abnormal   Collection Time: 08/03/17  5:14 PM  Result Value Ref Range   Glucose-Capillary >600 (HH) 70 - 99 mg/dL  I-Stat CG4 Lactic Acid, ED     Status: None   Collection Time: 08/03/17  5:25 PM  Result Value Ref Range   Lactic Acid, Venous 1.49 0.5 - 1.9 mmol/L   Ct Head Wo Contrast  Result Date: 08/03/2017 CLINICAL DATA:  Altered level of consciousness.  Hyperglycemia EXAM: CT HEAD WITHOUT CONTRAST TECHNIQUE: Contiguous axial images were obtained from the base of the skull through the vertex without intravenous contrast. COMPARISON:  06/16/2017 FINDINGS: Brain: The brainstem, cerebellum, cerebral peduncles, thalami, basal ganglia, and basilar cisterns appear normal. Mild prominence of the temporal horns of the lateral ventricles, stable. Cerebral atrophy. Periventricular white matter and corona radiata hypodensities favor chronic ischemic microvascular white matter disease. No intracranial hemorrhage, mass lesion, or acute CVA. Vascular: Unremarkable Skull: Unremarkable Sinuses/Orbits: Chronic right maxillary sinusitis. Other: No supplemental non-categorized findings. IMPRESSION: 1. Advanced for age cerebral atrophy. There is continued mild prominence of the temporal horns of the lateral ventricles suggesting mild communicating hydrocephalus. 2. Periventricular white matter and corona radiata hypodensities favor chronic ischemic microvascular white matter disease. 3. Chronic right maxillary sinusitis. 4. No acute findings. Electronically Signed   By: Van Clines M.D.   On:  08/03/2017 16:12    Pending Labs Unresulted Labs (From admission, onward)   Start     Ordered   08/03/17 1533  Urinalysis, Routine w reflex microscopic  STAT,   STAT     08/03/17 1533   08/03/17 1533  Urine rapid drug screen (hosp performed)  STAT,   STAT     08/03/17 1533      Vitals/Pain Today's Vitals   08/03/17 1603 08/03/17 1630 08/03/17 1700 08/03/17 1730  BP: (!) 171/95 (!) 172/93 (!) 169/99 (!) 168/94  Pulse: 86 85 83 81  Resp: _0 Temp:      TempSrc:      SpO2: 100% 100% 100% 100%  Weight:      Height:        Isolation Precautions No active isolations  Medications Medications  lactated ringers infusion ( Intravenous New Bag/Given 08/03/17 1720)  sodium chloride 0.9 % bolus 500 mL (0 mLs Intravenous Stopped 08/03/17 1704)  insulin aspart (novoLOG) injection 10 Units (10 Units Subcutaneous Given 08/03/17 1714)    Mobility walks

## 2017-08-04 ENCOUNTER — Inpatient Hospital Stay (HOSPITAL_COMMUNITY): Payer: Medicaid Other

## 2017-08-04 ENCOUNTER — Other Ambulatory Visit: Payer: Self-pay

## 2017-08-04 LAB — COMPREHENSIVE METABOLIC PANEL
ALK PHOS: 94 U/L (ref 38–126)
ALT: 27 U/L (ref 0–44)
AST: 13 U/L — ABNORMAL LOW (ref 15–41)
Albumin: 3.5 g/dL (ref 3.5–5.0)
Anion gap: 14 (ref 5–15)
BUN: 72 mg/dL — AB (ref 6–20)
CALCIUM: 9.4 mg/dL (ref 8.9–10.3)
CO2: 26 mmol/L (ref 22–32)
CREATININE: 2.03 mg/dL — AB (ref 0.61–1.24)
Chloride: 120 mmol/L — ABNORMAL HIGH (ref 98–111)
GFR, EST AFRICAN AMERICAN: 41 mL/min — AB (ref >60)
GFR, EST NON AFRICAN AMERICAN: 35 mL/min — AB (ref >60)
Glucose, Bld: 491 mg/dL — ABNORMAL HIGH (ref 70–99)
Potassium: 3.8 mmol/L (ref 3.5–5.1)
Sodium: 160 mmol/L — ABNORMAL HIGH (ref 135–145)
Total Bilirubin: 0.6 mg/dL (ref 0.3–1.2)
Total Protein: 6.9 g/dL (ref 6.5–8.1)

## 2017-08-04 LAB — BASIC METABOLIC PANEL WITH GFR
Anion gap: 10 (ref 5–15)
Anion gap: 8 (ref 5–15)
BUN: 68 mg/dL — ABNORMAL HIGH (ref 6–20)
CO2: 27 mmol/L (ref 22–32)
Calcium: 9.1 mg/dL (ref 8.9–10.3)
Chloride: 116 mmol/L — ABNORMAL HIGH (ref 98–111)
Glucose, Bld: 500 mg/dL — ABNORMAL HIGH (ref 70–99)
Glucose, Bld: 370 mg/dL — ABNORMAL HIGH (ref 70–99)
Potassium: 3.6 mmol/L (ref 3.5–5.1)

## 2017-08-04 LAB — BASIC METABOLIC PANEL
BUN: 63 mg/dL — ABNORMAL HIGH (ref 6–20)
CO2: 27 mmol/L (ref 22–32)
Calcium: 9.2 mg/dL (ref 8.9–10.3)
Chloride: 118 mmol/L — ABNORMAL HIGH (ref 98–111)
Creatinine, Ser: 1.6 mg/dL — ABNORMAL HIGH (ref 0.61–1.24)
Creatinine, Ser: 1.42 mg/dL — ABNORMAL HIGH (ref 0.61–1.24)
GFR calc Af Amer: 54 mL/min — ABNORMAL LOW (ref >60)
GFR calc Af Amer: >60 mL/min (ref >60)
GFR calc non Af Amer: 47 mL/min — ABNORMAL LOW (ref >60)
GFR calc non Af Amer: 54 mL/min — ABNORMAL LOW (ref >60)
Potassium: 4 mmol/L (ref 3.5–5.1)
Sodium: 155 mmol/L — ABNORMAL HIGH (ref 135–145)
Sodium: 151 mmol/L — ABNORMAL HIGH (ref 135–145)

## 2017-08-04 LAB — ECHOCARDIOGRAM COMPLETE
Height: 76 in
Weight: 1915.36 oz

## 2017-08-04 LAB — CBC
HCT: 39.1 % (ref 39.0–52.0)
Hemoglobin: 12 g/dL — ABNORMAL LOW (ref 13.0–17.0)
MCH: 25.3 pg — AB (ref 26.0–34.0)
MCHC: 30.7 g/dL (ref 30.0–36.0)
MCV: 82.5 fL (ref 78.0–100.0)
PLATELETS: 314 10*3/uL (ref 150–400)
RBC: 4.74 MIL/uL (ref 4.22–5.81)
RDW: 13.5 % (ref 11.5–15.5)
WBC: 8.3 10*3/uL (ref 4.0–10.5)

## 2017-08-04 LAB — HEMOGLOBIN A1C
HEMOGLOBIN A1C: 11 % — AB (ref 4.8–5.6)
HEMOGLOBIN A1C: 11.1 % — AB (ref 4.8–5.6)
MEAN PLASMA GLUCOSE: 269 mg/dL
MEAN PLASMA GLUCOSE: 271.87 mg/dL

## 2017-08-04 LAB — GLUCOSE, CAPILLARY
GLUCOSE-CAPILLARY: 413 mg/dL — AB (ref 70–99)
GLUCOSE-CAPILLARY: 470 mg/dL — AB (ref 70–99)
Glucose-Capillary: 429 mg/dL — ABNORMAL HIGH (ref 70–99)
Glucose-Capillary: 203 mg/dL — ABNORMAL HIGH (ref 70–99)

## 2017-08-04 LAB — TROPONIN I
TROPONIN I: 0.05 ng/mL — AB (ref <0.03)
Troponin I: 0.04 ng/mL (ref <0.03)

## 2017-08-04 MED ORDER — GLUCERNA SHAKE PO LIQD
237.0000 mL | Freq: Three times a day (TID) | ORAL | Status: DC
  Administered 2017-08-04 – 2017-08-16 (×34): 237 mL via ORAL
  Filled 2017-08-04 (×38): qty 237

## 2017-08-04 MED ORDER — INSULIN ASPART 100 UNIT/ML ~~LOC~~ SOLN
0.0000 [IU] | Freq: Three times a day (TID) | SUBCUTANEOUS | Status: DC
  Administered 2017-08-04: 3 [IU] via SUBCUTANEOUS
  Administered 2017-08-05 (×2): 20 [IU] via SUBCUTANEOUS
  Administered 2017-08-06 (×2): 15 [IU] via SUBCUTANEOUS
  Administered 2017-08-06: 20 [IU] via SUBCUTANEOUS
  Administered 2017-08-07: 4 [IU] via SUBCUTANEOUS
  Administered 2017-08-07: 3 [IU] via SUBCUTANEOUS
  Administered 2017-08-07: 15 [IU] via SUBCUTANEOUS
  Administered 2017-08-08: 7 [IU] via SUBCUTANEOUS
  Administered 2017-08-08: 20 [IU] via SUBCUTANEOUS

## 2017-08-04 MED ORDER — INSULIN ASPART 100 UNIT/ML ~~LOC~~ SOLN
20.0000 [IU] | Freq: Once | SUBCUTANEOUS | Status: AC
  Administered 2017-08-04: 20 [IU] via SUBCUTANEOUS

## 2017-08-04 MED ORDER — INSULIN ASPART PROT & ASPART (70-30 MIX) 100 UNIT/ML ~~LOC~~ SUSP
55.0000 [IU] | Freq: Two times a day (BID) | SUBCUTANEOUS | Status: DC
  Administered 2017-08-04: 55 [IU] via SUBCUTANEOUS
  Filled 2017-08-04: qty 10

## 2017-08-04 MED ORDER — INSULIN ASPART 100 UNIT/ML ~~LOC~~ SOLN
0.0000 [IU] | Freq: Every day | SUBCUTANEOUS | Status: DC
  Administered 2017-08-04: 2 [IU] via SUBCUTANEOUS
  Administered 2017-08-05: 3 [IU] via SUBCUTANEOUS
  Administered 2017-08-06: 2 [IU] via SUBCUTANEOUS

## 2017-08-04 MED ORDER — SODIUM CHLORIDE 0.45 % IV SOLN
INTRAVENOUS | Status: DC
  Administered 2017-08-04: 08:00:00 via INTRAVENOUS
  Administered 2017-08-04: 125 mL/h via INTRAVENOUS
  Administered 2017-08-04 – 2017-08-06 (×4): via INTRAVENOUS

## 2017-08-04 MED ORDER — ENSURE ENLIVE PO LIQD
237.0000 mL | Freq: Two times a day (BID) | ORAL | Status: DC
  Administered 2017-08-04: 237 mL via ORAL

## 2017-08-04 NOTE — Progress Notes (Signed)
CBG=470, MD notified, medicated with 20 units per MD order.

## 2017-08-04 NOTE — Progress Notes (Signed)
PROGRESS NOTE    Mike [name]  RUE:[id number] DOB: 02/23/1960 DOA: 08/03/2017 PCP: [name], [name], NP    Brief Narrative:  57 year old with past medical history relevant for type 2 diabetes on insulin, hypertension, hyperlipidemia, iron deficiency anemia, homelessness admitted after being found down with hyperglycemic nonketotic state with severe hypernatremia and AKI.   Assessment & Plan:   Principal Problem:   Uncontrolled type 2 diabetes mellitus with hyperosmolar nonketotic hyperglycemia (HCC) Active Problems:   Homelessness   Tobacco abuse   Essential hypertension   Type 2 diabetes mellitus with hyperosmolar nonketotic hyperglycemia (HCC)   #) Hyper glycemic nonketotic state: Patient apparently had been noncompliant with his diabetes so he might have a component of heatstroke contributing to his current presentation.  Regardless his blood sugars have improved somewhat. - Sliding scale insulin, AC at bedtime - Increase to 70/3055 units twice daily -Carb restricted diet  #) Hypernatremia: Unclear etiology though suspect most likely free water losses from being outside for 3 days and down. - Start half-normal saline - Encourage free water intake -Every 6 hours BMPs  #) AKI: Baseline creatinine is normal.  Improving with IV hydration -Hold nephrotoxins  #) Hypertension/hyperlipidemia: -Hold lisinopril 40 mg daily -Continue metoprolol tartrate 12.5 mg twice daily -Continue amlodipine 10 mg daily -Continue aspirin 325 mg daily -Continue atorvastatin 40 mg daily  #) Homelessness: -Social work consult  Fluids: Half-normal saline per above Elect lites: Monitor and supplement Nutrition: Carb/heart healthy diet  Prophylaxis: Subcu heparin  Disposition: Pending improved blood sugars and safe disposition  Full code    Consultants:   None  Procedures:   None  Antimicrobials:   None   Subjective: Patient only responds yes or no.  He reports feeling  well.  He denies any nausea, vomiting, abdominal pain.  He denies any cough, congestion, rhinorrhea.  Objective: Vitals:   08/03/17 2009 08/04/17 0518 08/04/17 0913 08/04/17 1414  BP:  (!) 167/90 (!) 154/94 (!) 149/83  Pulse:  (!) 55 (!) 57 (!) 52  Resp:  12  14  Temp:  97.8 F (36.6 C)  98 F (36.7 C)  TempSrc:    Oral  SpO2:  100% 100% 100%  Weight: 54.3 kg (119 lb 11.4 oz)     Height: 6\' 4"  (1.93 m)       Intake/Output Summary (Last 24 hours) at 08/04/2017 1450 Last data filed at 08/04/2017 1302 Gross per 24 hour  Intake 2429.58 ml  Output 900 ml  Net 1529.58 ml   Filed Weights   08/03/17 1528 08/03/17 2009  Weight: 72.6 kg (160 lb) 54.3 kg (119 lb 11.4 oz)    Examination:  General exam: No acute distress Respiratory system: Clear to auscultation. Respiratory effort normal. Cardiovascular system: Regular rate and rhythm, no murmurs Gastrointestinal system: Abdomen is nondistended, soft and nontender. No organomegaly or masses felt. Normal bowel sounds heard. Central nervous system: Alert and oriented. No focal neurological deficits. Extremities: No lower extremity edema Skin: No rashes on visible skin Psychiatry: Judgement and insight appear normal. Mood & affect withdrawn    Data Reviewed: I have personally reviewed following labs and imaging studies  CBC: Recent Labs  Lab 08/03/17 1545 08/03/17 1928 08/04/17 0115  WBC 7.1 8.4 8.3  NEUTROABS 4.8  --   --   HGB 11.9* 11.8* 12.0*  HCT 38.3* 38.1* 39.1  MCV 81.7 81.6 82.5  PLT 348 360 314   Basic Metabolic Panel: Recent Labs  Lab 08/03/17 1545 08/03/17 1928 08/04/17 0115  NA 158*  --  160*  K 4.2  --  3.8  CL 117*  --  120*  CO2 26  --  26  GLUCOSE 694*  --  491*  BUN 75*  --  72*  CREATININE 2.54* 2.23* 2.03*  CALCIUM 9.2  --  9.4   GFR: Estimated Creatinine Clearance: 31.6 mL/min (A) (by C-G formula based on SCr of 2.03 mg/dL (H)). Liver Function Tests: Recent Labs  Lab 08/03/17 1545  08/04/17 0115  AST 13* 13*  ALT 32 27  ALKPHOS 105 94  BILITOT 0.8 0.6  PROT 7.5 6.9  ALBUMIN 3.8 3.5   No results for input(s): LIPASE, AMYLASE in the last 168 hours. Recent Labs  Lab 08/03/17 1546  AMMONIA 24   Coagulation Profile: No results for input(s): INR, PROTIME in the last 168 hours. Cardiac Enzymes: Recent Labs  Lab 08/03/17 1545 08/03/17 1928 08/04/17 0115 08/04/17 0655  CKTOTAL 55  --   --   --   TROPONINI  --  0.05* 0.05* 0.04*   BNP (last 3 results) No results for input(s): PROBNP in the last 8760 hours. HbA1C: Recent Labs    08/03/17 1928 08/04/17 0115  HGBA1C 11.0* 11.1*   CBG: Recent Labs  Lab 08/03/17 1526 08/03/17 1714 08/03/17 2334 08/04/17 0135 08/04/17 1206  GLUCAP 569* >600* 503* 413* 470*   Lipid Profile: No results for input(s): CHOL, HDL, LDLCALC, TRIG, CHOLHDL, LDLDIRECT in the last 72 hours. Thyroid Function Tests: Recent Labs    08/03/17 1928  TSH 0.341*   Anemia Panel: No results for input(s): VITAMINB12, FOLATE, FERRITIN, TIBC, IRON, RETICCTPCT in the last 72 hours. Sepsis Labs: Recent Labs  Lab 08/03/17 1552 08/03/17 1725  LATICACIDVEN 2.08* 1.49    No results found for this or any previous visit (from the past 240 hour(s)).       Radiology Studies: Ct Head Wo Contrast  Result Date: 08/03/2017 CLINICAL DATA:  Altered level of consciousness.  Hyperglycemia EXAM: CT HEAD WITHOUT CONTRAST TECHNIQUE: Contiguous axial images were obtained from the base of the skull through the vertex without intravenous contrast. COMPARISON:  06/16/2017 FINDINGS: Brain: The brainstem, cerebellum, cerebral peduncles, thalami, basal ganglia, and basilar cisterns appear normal. Mild prominence of the temporal horns of the lateral ventricles, stable. Cerebral atrophy. Periventricular white matter and corona radiata hypodensities favor chronic ischemic microvascular white matter disease. No intracranial hemorrhage, mass lesion, or acute  CVA. Vascular: Unremarkable Skull: Unremarkable Sinuses/Orbits: Chronic right maxillary sinusitis. Other: No supplemental non-categorized findings. IMPRESSION: 1. Advanced for age cerebral atrophy. There is continued mild prominence of the temporal horns of the lateral ventricles suggesting mild communicating hydrocephalus. 2. Periventricular white matter and corona radiata hypodensities favor chronic ischemic microvascular white matter disease. 3. Chronic right maxillary sinusitis. 4. No acute findings. Electronically Signed   By: Gaylyn Rong M.D.   On: 08/03/2017 16:12        Scheduled Meds: . amLODipine  10 mg Oral Daily  . aspirin  325 mg Oral Daily  . atorvastatin  40 mg Oral Daily  . feeding supplement (GLUCERNA SHAKE)  237 mL Oral TID BM  . ferrous sulfate  325 mg Oral Daily  . heparin  5,000 Units Subcutaneous Q8H  . insulin aspart  0-20 Units Subcutaneous TID WC  . insulin aspart  0-5 Units Subcutaneous QHS  . insulin aspart protamine- aspart  30 Units Subcutaneous BID WC  . metoprolol tartrate  12.5 mg Oral BID   Continuous Infusions: . sodium  chloride 125 mL/hr at 08/04/17 0819     LOS: 1 day    Time spent: 35    Delaine Lame, MD [facility]  If 7PM-7AM, please contact night-coverage [email or web address] Password [email or web address] 08/04/2017, 2:50 PM

## 2017-08-04 NOTE — Progress Notes (Signed)
  Echocardiogram 2D Echocardiogram has been performed.  Celene SkeenVijay  Chanelle Hodsdon 08/04/2017, 11:46 AM

## 2017-08-04 NOTE — Progress Notes (Signed)
CBG=429, MD notified, new order received. Medicated with Insulin Novolog 20 Units

## 2017-08-05 LAB — CBC
HCT: 37.8 % — ABNORMAL LOW (ref 39.0–52.0)
Hemoglobin: 11.8 g/dL — ABNORMAL LOW (ref 13.0–17.0)
MCH: 25.5 pg — ABNORMAL LOW (ref 26.0–34.0)
MCHC: 31.2 g/dL (ref 30.0–36.0)
MCV: 81.6 fL (ref 78.0–100.0)
Platelets: 269 K/uL (ref 150–400)
RBC: 4.63 MIL/uL (ref 4.22–5.81)
RDW: 13.2 % (ref 11.5–15.5)
WBC: 9.7 K/uL (ref 4.0–10.5)

## 2017-08-05 LAB — BASIC METABOLIC PANEL
Anion gap: 6 (ref 5–15)
Anion gap: 7 (ref 5–15)
BUN: 51 mg/dL — ABNORMAL HIGH (ref 6–20)
BUN: 47 mg/dL — ABNORMAL HIGH (ref 6–20)
CO2: 28 mmol/L (ref 22–32)
CO2: 27 mmol/L (ref 22–32)
Calcium: 9 mg/dL (ref 8.9–10.3)
Chloride: 117 mmol/L — ABNORMAL HIGH (ref 98–111)
Chloride: 116 mmol/L — ABNORMAL HIGH (ref 98–111)
Creatinine, Ser: 1.13 mg/dL (ref 0.61–1.24)
Creatinine, Ser: 1.15 mg/dL (ref 0.61–1.24)
GFR calc Af Amer: >60 mL/min (ref >60)
Glucose, Bld: 114 mg/dL — ABNORMAL HIGH (ref 70–99)
Glucose, Bld: 58 mg/dL — ABNORMAL LOW (ref 70–99)
Potassium: 3.3 mmol/L — ABNORMAL LOW (ref 3.5–5.1)

## 2017-08-05 LAB — GLUCOSE, CAPILLARY
GLUCOSE-CAPILLARY: 112 mg/dL — AB (ref 70–99)
GLUCOSE-CAPILLARY: 419 mg/dL — AB (ref 70–99)
GLUCOSE-CAPILLARY: 271 mg/dL — AB (ref 70–99)
Glucose-Capillary: 403 mg/dL — ABNORMAL HIGH (ref 70–99)
Glucose-Capillary: 408 mg/dL — ABNORMAL HIGH (ref 70–99)

## 2017-08-05 LAB — BASIC METABOLIC PANEL WITH GFR
Calcium: 9 mg/dL (ref 8.9–10.3)
GFR calc Af Amer: >60 mL/min (ref >60)
GFR calc non Af Amer: >60 mL/min (ref >60)
GFR calc non Af Amer: >60 mL/min (ref >60)
Potassium: 3.2 mmol/L — ABNORMAL LOW (ref 3.5–5.1)
Sodium: 151 mmol/L — ABNORMAL HIGH (ref 135–145)
Sodium: 150 mmol/L — ABNORMAL HIGH (ref 135–145)

## 2017-08-05 LAB — MAGNESIUM: Magnesium: 2.4 mg/dL (ref 1.7–2.4)

## 2017-08-05 LAB — GLUCOSE, RANDOM: Glucose, Bld: 439 mg/dL — ABNORMAL HIGH (ref 70–99)

## 2017-08-05 MED ORDER — INSULIN ASPART PROT & ASPART (70-30 MIX) 100 UNIT/ML ~~LOC~~ SUSP
40.0000 [IU] | Freq: Two times a day (BID) | SUBCUTANEOUS | Status: DC
  Administered 2017-08-05 – 2017-08-06 (×2): 40 [IU] via SUBCUTANEOUS
  Filled 2017-08-05: qty 10

## 2017-08-05 MED ORDER — POTASSIUM CHLORIDE CRYS ER 20 MEQ PO TBCR
40.0000 meq | EXTENDED_RELEASE_TABLET | Freq: Once | ORAL | Status: AC
  Administered 2017-08-05: 40 meq via ORAL
  Filled 2017-08-05: qty 2

## 2017-08-05 NOTE — Plan of Care (Signed)
Patient denies pain, VSS, CBG's continue to be elevated.  Patient with good appetite, tolerating carb mod diet, drinking as much fluid as staff will give him.

## 2017-08-05 NOTE — Progress Notes (Signed)
Initial Nutrition Assessment  DOCUMENTATION CODES:   Severe malnutrition in context of social or environmental circumstances, Severe malnutrition in context of chronic illness, Underweight  INTERVENTION:   - Continue Glucerna Shake po TID, each supplement provides 220 kcal and 10 grams of protein (strawberrry flavor)  - Once blood glucose levels stabilize, recommend Ensure Enlive po TID, each supplement provides 350 kcal and 20 grams of protein  (Pt with poor oral intake and severe protein-calorie malnutrition and would benefit from nutrient-dense supplement. One Ensure Enlive supplement provides 350 kcals, 20 grams protein, and 44-45 grams of carbohydrate vs one Glucerna shake supplement, which provides 220 kcals, 10 grams of protein, and 26 grams of carbohydrate. Given pt's hx of DM, RD will continue to monitor PO intake, CBGS, and adjust supplement regimen as appropriate.)  Monitor magnesium, potassium, and phosphorus daily for at least 3 days, MD to replete as needed, as pt is at risk for refeeding syndrome given report of no PO intake x 3 days PTA and severe protein-calorie malnutrition.  NUTRITION DIAGNOSIS:   Severe Malnutrition related to social / environmental circumstances, chronic illness(homelessness, poorly controlled IDDM) as evidenced by moderate fat depletion, severe fat depletion, moderate muscle depletion, severe muscle depletion, percent weight loss(22.5% weight loss in 7 months).  GOAL:   Patient will meet greater than or equal to 90% of their needs  MONITOR:   PO intake, Labs, Supplement acceptance, I & O's, Weight trends  REASON FOR ASSESSMENT:   Malnutrition Screening Tool    ASSESSMENT:   57 year old male who presented to the ED after being found under a bush (pt is homeless and had been lying there for approximately 3 days) by a bystander. Pt was found to have hyperglycemia. PMH significant for IDDM with history of medication non-compliance, hypertension,  hyperlipidemia, and seizure disorder.  Discussed pt with RN.  Spoke with pt at bedside. Pt rarely answered RD questions verbally but did nod and shake his head. Pt unable to provide much information or details regarding weight history and diet history PTA.  Pt endorsed having a good appetite but was unable to report how many meals he typically consumes. When asked what he might have for a meal, pt looked at then listed the items on his lunch tray. Noted 25-50% completed lunch tray at bedside. Pt had consumed 100% of the meat and beverages. Pt agreeable to receiving an oral nutrition supplement to maximize kcal and protein intake.  Pt states that he typically drinks coconut water but did not provide any further information.  Per chart review, pt was found under a bush after lying there for 3 days. Suspect pt with little to no PO intake during this time. Pt is at refeeding risk.  Pt reports that he usually weighs 170 lbs. Pt unsure when he last weighed this. The majority of weights in pt's chart appear to be stated rather than measured, so unsure of accuracy. Measured weight of 151 lbs 4 oz noted on 02/07/17 which is a weight loss of 34 lbs in 7 months. This is a 22.5% weight loss which is severe and significant for timeframe.  Meal Completion: 10-50%  Medications reviewed and include: Glucerna TID, 325 mg ferrous sulfate daily, sliding scale Novolog, 40 units Novolog Mix 70/30 BID  Labs reviewed: sodium 151 (H), potassium 3.3 (L), chloride 116 (H), BUN 47 (H), hemoglobin 11.8 (L), HCT 37.8 (L) CBG's: 403, 112, 203, 429 x 24 hours  UOP: 1300 ml x 24 hours I/O's: +3.8 L since admission  NUTRITION - FOCUSED PHYSICAL EXAM:    Most Recent Value  Orbital Region  Severe depletion  Upper Arm Region  Severe depletion  Thoracic and Lumbar Region  Severe depletion  Buccal Region  Moderate depletion  Temple Region  Severe depletion  Clavicle Bone Region  Severe depletion  Clavicle and Acromion  Bone Region  Severe depletion  Scapular Bone Region  Unable to assess  Dorsal Hand  Moderate depletion  Patellar Region  Severe depletion  Anterior Thigh Region  Severe depletion  Posterior Calf Region  Moderate depletion  Edema (RD Assessment)  None  Hair  Reviewed  Eyes  Reviewed  Mouth  Reviewed [poor dentition, several teeth missing]  Skin  Reviewed  Nails  Reviewed       Diet Order:   Diet Order           Diet heart healthy/carb modified Room service appropriate? Yes; Fluid consistency: Thin  Diet effective now          EDUCATION NEEDS:   Not appropriate for education at this time  Skin:  Skin Assessment: Reviewed RN Assessment  Last BM:  08/05/17 type 5  Height:   Ht Readings from Last 1 Encounters:  08/03/17 6\' 4"  (1.93 m)    Weight:   Wt Readings from Last 1 Encounters:  08/03/17 119 lb 11.4 oz (54.3 kg)    Ideal Body Weight:  91.82 kg  BMI:  Body mass index is 14.57 kg/m.  Estimated Nutritional Needs:   Kcal:  1700-1900 kcal/day  Protein:  80-95 grams/day  Fluid:  1.7-1.9 L/day    Earma ReadingKate Jablonski Dal Blew, MS, RD, LDN Pager: [phone] Weekend/After Hours: [phone]

## 2017-08-05 NOTE — Progress Notes (Signed)
PROGRESS NOTE    Mike [name]  ZOX:[id number] DOB: Jun 05, 1960 DOA: 08/03/2017 PCP: [name], NP    Brief Narrative:  57 year old with past medical history relevant for type 2 diabetes on insulin, hypertension, hyperlipidemia, iron deficiency anemia, homelessness admitted after being found down with hyperglycemic nonketotic state with severe hypernatremia and AKI.   Assessment & Plan:   Principal Problem:   Uncontrolled type 2 diabetes mellitus with hyperosmolar nonketotic hyperglycemia (HCC) Active Problems:   Homelessness   Tobacco abuse   Essential hypertension   Type 2 diabetes mellitus with hyperosmolar nonketotic hyperglycemia (HCC)   #) Hyper glycemic nonketotic state in the setting of type 2 diabetes: Resolved with insulin. - Sliding scale insulin, AC at bedtime - Continue 70/30 40 units twice daily -Carb restricted diet  #) Hypernatremia: Improving slowly with half-normal saline - Continue half-normal saline - Encourage free water intake  #) AKI: Resolved with IV hydration -Hold nephrotoxins  #) Hypertension/hyperlipidemia: -Hold lisinopril 40 mg daily -Continue metoprolol tartrate 12.5 mg twice daily -Continue amlodipine 10 mg daily -Continue aspirin 325 mg daily -Continue atorvastatin 40 mg daily  #) Homelessness: -Social work consult  Fluids: Half-normal saline per above Elect lites: Monitor and supplement Nutrition: Carb/heart healthy diet  Prophylaxis: Subcu heparin  Disposition: Pending improved blood sugars and safe disposition  Full code    Consultants:   None  Procedures:   None  Antimicrobials:   None   Subjective: Patient reports he is doing better.  He denies any nausea, vomiting, diarrhea.  Objective: Vitals:   08/04/17 2113 08/05/17 0652 08/05/17 1025 08/05/17 1032  BP: 134/76 139/75 135/87 135/87  Pulse: (!) 54 (!) 53 61   Resp: 18 17    Temp: (!) 97.4 F (36.3 C)     TempSrc:      SpO2: 99% 100%      Weight:      Height:        Intake/Output Summary (Last 24 hours) at 08/05/2017 1134 Last data filed at 08/05/2017 1058 Gross per 24 hour  Intake 3635.42 ml  Output 1590 ml  Net 2045.42 ml   Filed Weights   08/03/17 1528 08/03/17 2009  Weight: 72.6 kg (160 lb) 54.3 kg (119 lb 11.4 oz)    Examination:  General exam: No acute distress Respiratory system: Clear to auscultation. Respiratory effort normal. Cardiovascular system: Regular rate and rhythm, no murmurs Gastrointestinal system: Abdomen is nondistended, soft and nontender. No organomegaly or masses felt. Normal bowel sounds heard. Central nervous system: Alert and oriented. No focal neurological deficits. Extremities: No lower extremity edema Skin: No rashes on visible skin Psychiatry: Judgement and insight appear normal. Mood & affect withdrawn    Data Reviewed: I have personally reviewed following labs and imaging studies  CBC: Recent Labs  Lab 08/03/17 1545 08/03/17 1928 08/04/17 0115 08/05/17 0605  WBC 7.1 8.4 8.3 9.7  NEUTROABS 4.8  --   --   --   HGB 11.9* 11.8* 12.0* 11.8*  HCT 38.3* 38.1* 39.1 37.8*  MCV 81.7 81.6 82.5 81.6  PLT 348 360 314 269   Basic Metabolic Panel: Recent Labs  Lab 08/04/17 0115 08/04/17 1154 08/04/17 1830 08/05/17 0027 08/05/17 0605  NA 160* 155* 151* 151* 150*  K 3.8 4.0 3.6 3.2* 3.3*  CL 120* 118* 116* 117* 116*  CO2 26 27 27 28 27   GLUCOSE 491* 500* 370* 114* 58*  BUN 72* 68* 63* 51* 47*  CREATININE 2.03* 1.60* 1.42* 1.13 1.15  CALCIUM 9.4  9.1 9.2 9.0 9.0  MG  --   --   --   --  2.4   GFR: Estimated Creatinine Clearance: 55.7 mL/min (by C-G formula based on SCr of 1.15 mg/dL). Liver Function Tests: Recent Labs  Lab 08/03/17 1545 08/04/17 0115  AST 13* 13*  ALT 32 27  ALKPHOS 105 94  BILITOT 0.8 0.6  PROT 7.5 6.9  ALBUMIN 3.8 3.5   No results for input(s): LIPASE, AMYLASE in the last 168 hours. Recent Labs  Lab 08/03/17 1546  AMMONIA 24    Coagulation Profile: No results for input(s): INR, PROTIME in the last 168 hours. Cardiac Enzymes: Recent Labs  Lab 08/03/17 1545 08/03/17 1928 08/04/17 0115 08/04/17 0655  CKTOTAL 55  --   --   --   TROPONINI  --  0.05* 0.05* 0.04*   BNP (last 3 results) No results for input(s): PROBNP in the last 8760 hours. HbA1C: Recent Labs    08/03/17 1928 08/04/17 0115  HGBA1C 11.0* 11.1*   CBG: Recent Labs  Lab 08/04/17 0135 08/04/17 1206 08/04/17 1657 08/04/17 2115 08/05/17 0744  GLUCAP 413* 470* 429* 203* 112*   Lipid Profile: No results for input(s): CHOL, HDL, LDLCALC, TRIG, CHOLHDL, LDLDIRECT in the last 72 hours. Thyroid Function Tests: Recent Labs    08/03/17 1928  TSH 0.341*   Anemia Panel: No results for input(s): VITAMINB12, FOLATE, FERRITIN, TIBC, IRON, RETICCTPCT in the last 72 hours. Sepsis Labs: Recent Labs  Lab 08/03/17 1552 08/03/17 1725  LATICACIDVEN 2.08* 1.49    No results found for this or any previous visit (from the past 240 hour(s)).       Radiology Studies: Ct Head Wo Contrast  Result Date: 08/03/2017 CLINICAL DATA:  Altered level of consciousness.  Hyperglycemia EXAM: CT HEAD WITHOUT CONTRAST TECHNIQUE: Contiguous axial images were obtained from the base of the skull through the vertex without intravenous contrast. COMPARISON:  06/16/2017 FINDINGS: Brain: The brainstem, cerebellum, cerebral peduncles, thalami, basal ganglia, and basilar cisterns appear normal. Mild prominence of the temporal horns of the lateral ventricles, stable. Cerebral atrophy. Periventricular white matter and corona radiata hypodensities favor chronic ischemic microvascular white matter disease. No intracranial hemorrhage, mass lesion, or acute CVA. Vascular: Unremarkable Skull: Unremarkable Sinuses/Orbits: Chronic right maxillary sinusitis. Other: No supplemental non-categorized findings. IMPRESSION: 1. Advanced for age cerebral atrophy. There is continued mild  prominence of the temporal horns of the lateral ventricles suggesting mild communicating hydrocephalus. 2. Periventricular white matter and corona radiata hypodensities favor chronic ischemic microvascular white matter disease. 3. Chronic right maxillary sinusitis. 4. No acute findings. Electronically Signed   By: Gaylyn RongWalter  Liebkemann M.D.   On: 08/03/2017 16:12        Scheduled Meds: . amLODipine  10 mg Oral Daily  . aspirin  325 mg Oral Daily  . atorvastatin  40 mg Oral Daily  . feeding supplement (GLUCERNA SHAKE)  237 mL Oral TID BM  . ferrous sulfate  325 mg Oral Daily  . heparin  5,000 Units Subcutaneous Q8H  . insulin aspart  0-20 Units Subcutaneous TID WC  . insulin aspart  0-5 Units Subcutaneous QHS  . insulin aspart protamine- aspart  40 Units Subcutaneous BID WC  . metoprolol tartrate  12.5 mg Oral BID   Continuous Infusions: . sodium chloride 125 mL/hr (08/04/17 2338)     LOS: 2 days    Time spent: 35    Delaine LameShrey C Libbi Towner, MD [facility]  If 7PM-7AM, please contact night-coverage [email or web address] Password [email or web address]  08/05/2017, 11:34 AM

## 2017-08-05 NOTE — Clinical Social Work Note (Signed)
Clinical Social Work Assessment  Patient Details  Name: Mike [name] MRN: [id number] Date of Birth: 15-Mar-1960  Date of referral:  08/05/17               Reason for consult:  Housing Concerns/Homelessness                Permission sought to share information with:   n/a Permission granted to share information::     Name::        Agency::     Relationship::     Contact Information:     Housing/Transportation Living arrangements for the past 2 months:  Homeless Company secretary) Source of Information:  Patient Patient Interpreter Needed:  None Criminal Activity/Legal Involvement Pertinent to Current Situation/Hospitalization:  No - Comment as needed Significant Relationships:  Friend Lives with:  Other (Comment)(Homeless shelter) Do you feel safe going back to the place where you live?  Yes Need for family participation in patient care:  No (Coment)  Care giving concerns:  Patient is currently homeless and has not been taking medication needed related to his diabetes. He was admitted to hospital with blood sugar of more than 600.    Social Worker assessment / plan:  CSW met with patient to introduce self and role and to discuss patient's discharge plans. Per patient, he has been living at [facility] and plans to return there. Patient said he is unfamiliar with other shelters in the area. Patient has flat affect and delayed responses to some questions but answers appropriately. CSW will provide patient with list of additional shelters in the area and bus vouchers to get him to destination.  Employment status:  Unemployed Forensic scientist:  Medicaid In [place] PT Recommendations:  Not assessed at this time Information / Referral to community resources:  Shelter  Patient/Family's Response to care: Patient understanding of situation and plans to return to previous shelter he was living in. He repeatedly asks for juice but is unable to have any at this time.    Patient/Family's Understanding of and Emotional Response to Diagnosis, Current Treatment, and Prognosis:  Patient did not display an emotional response to CSW conversation. He seemingly understands discharge plans and will return to shelter he was previously living at.   Emotional Assessment Appearance:  Appears stated age Attitude/Demeanor/Rapport:  Other(Delayed responses) Affect (typically observed):  Flat, Appropriate Orientation:  Oriented to Self, Oriented to Place, Oriented to  Time, Oriented to Situation Alcohol / Substance use:  Not Applicable Psych involvement (Current and /or in the community):  No (Comment)  Discharge Needs  Concerns to be addressed:  Homelessness, Care Coordination Readmission within the last 30 days:  No Current discharge risk:  Homeless Barriers to Discharge:  Homeless with medical needs   Pricilla Holm, LCSWA 08/05/2017, 2:45 PM

## 2017-08-06 LAB — GLUCOSE, CAPILLARY
GLUCOSE-CAPILLARY: 348 mg/dL — AB (ref 70–99)
GLUCOSE-CAPILLARY: 409 mg/dL — AB (ref 70–99)
GLUCOSE-CAPILLARY: 211 mg/dL — AB (ref 70–99)
Glucose-Capillary: 308 mg/dL — ABNORMAL HIGH (ref 70–99)

## 2017-08-06 LAB — BASIC METABOLIC PANEL
Anion gap: 5 (ref 5–15)
BUN: 32 mg/dL — ABNORMAL HIGH (ref 6–20)
Calcium: 8.7 mg/dL — ABNORMAL LOW (ref 8.9–10.3)
Chloride: 108 mmol/L (ref 98–111)
GFR calc non Af Amer: >60 mL/min (ref >60)
Glucose, Bld: 255 mg/dL — ABNORMAL HIGH (ref 70–99)
Sodium: 141 mmol/L (ref 135–145)

## 2017-08-06 LAB — GLUCOSE, RANDOM: Glucose, Bld: 465 mg/dL — ABNORMAL HIGH (ref 70–99)

## 2017-08-06 LAB — BASIC METABOLIC PANEL WITH GFR
CO2: 28 mmol/L (ref 22–32)
Creatinine, Ser: 0.92 mg/dL (ref 0.61–1.24)
GFR calc Af Amer: >60 mL/min (ref >60)
Potassium: 4.4 mmol/L (ref 3.5–5.1)

## 2017-08-06 MED ORDER — INSULIN ASPART PROT & ASPART (70-30 MIX) 100 UNIT/ML ~~LOC~~ SUSP
48.0000 [IU] | Freq: Two times a day (BID) | SUBCUTANEOUS | Status: DC
  Administered 2017-08-06: 48 [IU] via SUBCUTANEOUS

## 2017-08-06 MED ORDER — LISINOPRIL 20 MG PO TABS
40.0000 mg | ORAL_TABLET | Freq: Every day | ORAL | Status: DC
Start: 2017-08-06 — End: 2017-08-16
  Administered 2017-08-06 – 2017-08-16 (×11): 40 mg via ORAL
  Filled 2017-08-06: qty 4
  Filled 2017-08-06 (×10): qty 2

## 2017-08-06 NOTE — Progress Notes (Signed)
Patient would like to take a nap and request all 4 siderails be used during rest time.

## 2017-08-06 NOTE — Evaluation (Signed)
Physical Therapy Evaluation Patient Details Name: Mike [name] MRN: [id number] DOB: Dec 22, 1960 Today's Date: 08/06/2017   History of Present Illness  57 yo male admitted with uncontrolled DM. Hx of medical noncompliance, HTN, depression, anxiety, Sz, CKD, DM. Pt is homeless.     Clinical Impression  On eval, pt required Min-Mod assist for mobility. He walked ~60 feet with a RW. He is very unsteady and currently at risk for falls. Pt presents with general weakness, decreased activity tolerance, and impaired gait and balance. Recommend SNF at this time. Will follow and progress activity as tolerated.     Follow Up Recommendations SNF    Equipment Recommendations  None recommended by PT    Recommendations for Other Services       Precautions / Restrictions Precautions Precautions: Fall Restrictions Weight Bearing Restrictions: No      Mobility  Bed Mobility Overal bed mobility: Needs Assistance Bed Mobility: Supine to Sit;Sit to Supine     Supine to sit: Supervision Sit to supine: Supervision   General bed mobility comments: for safety  Transfers Overall transfer level: Needs assistance Equipment used: Rolling walker (2 wheeled) Transfers: Sit to/from Stand Sit to Stand: Min assist         General transfer comment: Assist to rise, stabilize, control descent. VCS safety, hand placement.   Ambulation/Gait Ambulation/Gait assistance: Mod assist Gait Distance (Feet): 60 Feet Assistive device: Rolling walker (2 wheeled) Gait Pattern/deviations: Step-through pattern;Decreased stride length;Staggering right;Staggering left;Drifts right/left;Narrow base of support     General Gait Details: Assist to stabilize pt throughout distance and to maneuver with RW. Cues for safety, propr use of RW. Pt is at risk for falls currently  Stairs            Wheelchair Mobility    Modified Rankin (Stroke Patients Only)       Balance Overall balance assessment: Needs  assistance         Standing balance support: Bilateral upper extremity supported Standing balance-Leahy Scale: Poor                               Pertinent Vitals/Pain Pain Assessment: No/denies pain    Home Living Family/patient expects to be discharged to:: Shelter/Homeless                      Prior Function Level of Independence: Independent         Comments: does not use AD     Hand Dominance        Extremity/Trunk Assessment   Upper Extremity Assessment Upper Extremity Assessment: Generalized weakness    Lower Extremity Assessment Lower Extremity Assessment: Generalized weakness    Cervical / Trunk Assessment Cervical / Trunk Assessment: Normal  Communication   Communication: No difficulties  Cognition Arousal/Alertness: Awake/alert Behavior During Therapy: WFL for tasks assessed/performed Overall Cognitive Status: No family/caregiver present to determine baseline cognitive functioning                                        General Comments      Exercises     Assessment/Plan    PT Assessment Patient needs continued PT services  PT Problem List Decreased mobility;Decreased balance;Decreased safety awareness;Decreased strength       PT Treatment Interventions DME instruction;Gait training;Functional mobility training;Therapeutic activities;Balance training;Patient/family education;Therapeutic exercise  PT Goals (Current goals can be found in the Care Plan section)  Acute Rehab PT Goals Patient Stated Goal: none stated PT Goal Formulation: With patient Time For Goal Achievement: 08/20/17 Potential to Achieve Goals: Good    Frequency Min 3X/week   Barriers to discharge        Co-evaluation               AM-PAC PT "6 Clicks" Daily Activity  Outcome Measure Difficulty turning over in bed (including adjusting bedclothes, sheets and blankets)?: None Difficulty moving from lying on back to  sitting on the side of the bed? : None Difficulty sitting down on and standing up from a chair with arms (e.g., wheelchair, bedside commode, etc,.)?: Unable Help needed moving to and from a bed to chair (including a wheelchair)?: A Little Help needed walking in hospital room?: A Little Help needed climbing 3-5 steps with a railing? : A Lot 6 Click Score: 17    End of Session Equipment Utilized During Treatment: Gait belt Activity Tolerance: Patient tolerated treatment well Patient left: in bed;with call bell/phone within reach;with bed alarm set   PT Visit Diagnosis: Muscle weakness (generalized) (M62.81);Difficulty in walking, not elsewhere classified (R26.2);Unsteadiness on feet (R26.81)    Time: 7829-5621 PT Time Calculation (min) (ACUTE ONLY): 17 min   Charges:   PT Evaluation $PT Eval Moderate Complexity: 1 Mod     PT G Codes:         Rebeca Alert, MPT Pager: [phone]

## 2017-08-06 NOTE — Progress Notes (Signed)
Inpatient Diabetes Program Recommendations  AACE/ADA: New Consensus Statement on Inpatient Glycemic Control (2015)  Target Ranges:  Prepandial:   less than 140 mg/dL      Peak postprandial:   less than 180 mg/dL (1-2 hours)      Critically ill patients:  140 - 180 mg/dL   Consult for Diabetes education and homeless  Patient is very familiar to our team. Patient has been admitted 4 times in the last 6 months and has been in the ED 77 times in the last 6 months. In previous assessments, patient has very low comprehension level, which is a large barrier to him understanding and being able to retain resources for his medical conditions. Patient has inconsistent food intake and therefore PO meds would be difficult to prescribe. We have tried placement in the past to help since him going back to the street will lead to him being hyperglycemic again. Unsure of the resources and education we can to at this point. Our team has seen him on multiple occassions. We have exhausted many options for patient.  ...it may be unreasonable to send patient out on insulin since he has homeless issues and may not have the means to safely and properly store his insulin. Lantus and Novolog will be way too expensive out of pocket. Has received Rxs for 70/30 insulin before, however, patient does not take insulin consistently either and has no way to store if appropriately.Marland Kitchen.Marland Kitchen.Marland Kitchen.Would probably be best to not discharge on insulin.  There are oral medications called starlix and prandin that are taken with meals for carbohydrate coverage. This might be an option if he has sporadic intake, however unsure of what his out of pocket cost would be. Even then Patient would still need a significant amount of glucose control as he is on a large amount of basal insulin in his 70/30 doses.  Glucose in the 300's this am consider increasing 70/30 to 45-48 units BID. Would also consider a Psych evaluation as well due to the many admissions.  Unsure if patient is able to care for himself and has not taken care of himself in the past.   Thanks,  Christena DeemShannon Jaxtyn Linville RN, MSN, BC-ADM, [facility] Inpatient Diabetes Coordinator Team Pager [phone] (8a-5p)

## 2017-08-06 NOTE — Progress Notes (Signed)
PROGRESS NOTE    Mike [name]  RUE:[id number] DOB: May 22, 1960 DOA: 08/03/2017 PCP: [name], NP    Brief Narrative:  57 year old with past medical history relevant for type 2 diabetes on insulin, hypertension, hyperlipidemia, iron deficiency anemia, homelessness admitted after being found down with hyperglycemic nonketotic state with severe hypernatremia and AKI.   Assessment & Plan:   Principal Problem:   Uncontrolled type 2 diabetes mellitus with hyperosmolar nonketotic hyperglycemia (HCC) Active Problems:   Homelessness   Tobacco abuse   Essential hypertension   Type 2 diabetes mellitus with hyperosmolar nonketotic hyperglycemia (HCC)    #) Hyper glycemic nonketotic state in the setting of type 2 diabetes: Resolved with insulin.  The patient continues to have profoundly elevated and then subsequently low blood sugars.  His brittle diabetes raises the concern that he could have possibly [place] his diabetes or pancreatic failure.  Additionally he has had something like over 70 ER visits and 4 hospitalizations in the past 6 months. - Sliding scale insulin, AC at bedtime - Increase 70/30 to 48 units twice daily -Carb restricted diet -We will consult psychiatry for capacity evaluation and see if this would assist in placement where he could get a more stable insulin regimen as he is requiring sliding scale here but will likely unable to do sliding scale as an outpatient.  #) Hypernatremia: Resolved with half-normal saline - Discontinue half-normal saline - Encourage free water intake  #) AKI: Resolved with IV hydration  #) Hypertension/hyperlipidemia: -Restart lisinopril 40 mg daily -Continue metoprolol tartrate 12.5 mg twice daily -Continue amlodipine 10 mg daily -Continue aspirin 325 mg daily -Continue atorvastatin 40 mg daily  #) Homelessness/capacity evaluation: -Social work consult -Case management consult -Psychiatry consult  Fluids: Tolerating  p.o. Elect lites: Monitor and supplement Nutrition: Carb/heart healthy diet  Prophylaxis: Subcu heparin  Disposition: Pending improved blood sugars and safe disposition  Full code    Consultants:   None  Procedures:   None  Antimicrobials:   None   Subjective: Patient reports he is doing better.  He denies any nausea, vomiting, diarrhea.  Objective: Vitals:   08/05/17 1032 08/05/17 2026 08/06/17 0541 08/06/17 1319  BP: 135/87 (!) 143/74 125/85 (!) 143/70  Pulse:  69 76 72  Resp:  18  14  Temp:  98.5 F (36.9 C) 98.6 F (37 C) 98.7 F (37.1 C)  TempSrc:  Oral Oral Oral  SpO2:  100% 100% 100%  Weight:      Height:        Intake/Output Summary (Last 24 hours) at 08/06/2017 1344 Last data filed at 08/06/2017 1320 Gross per 24 hour  Intake 3340 ml  Output 2310 ml  Net 1030 ml   Filed Weights   08/03/17 1528 08/03/17 2009  Weight: 72.6 kg (160 lb) 54.3 kg (119 lb 11.4 oz)    Examination:  General exam: No acute distress Respiratory system: Clear to auscultation. Respiratory effort normal. Cardiovascular system: Regular rate and rhythm, no murmurs Gastrointestinal system: Abdomen is nondistended, soft and nontender. No organomegaly or masses felt. Normal bowel sounds heard. Central nervous system: Alert and oriented. No focal neurological deficits. Extremities: No lower extremity edema Skin: No rashes on visible skin Psychiatry: Judgement and insight appear impaired. Mood & affect withdrawn    Data Reviewed: I have personally reviewed following labs and imaging studies  CBC: Recent Labs  Lab 08/03/17 1545 08/03/17 1928 08/04/17 0115 08/05/17 0605  WBC 7.1 8.4 8.3 9.7  NEUTROABS 4.8  --   --   --  HGB 11.9* 11.8* 12.0* 11.8*  HCT 38.3* 38.1* 39.1 37.8*  MCV 81.7 81.6 82.5 81.6  PLT 348 360 314 269   Basic Metabolic Panel: Recent Labs  Lab 08/04/17 1154 08/04/17 1830 08/05/17 0027 08/05/17 0605 08/05/17 1311 08/06/17 0502  NA 155*  151* 151* 150*  --  141  K 4.0 3.6 3.2* 3.3*  --  4.4  CL 118* 116* 117* 116*  --  108  CO2 27 27 28 27   --  28  GLUCOSE 500* 370* 114* 58* 439* 255*  BUN 68* 63* 51* 47*  --  32*  CREATININE 1.60* 1.42* 1.13 1.15  --  0.92  CALCIUM 9.1 9.2 9.0 9.0  --  8.7*  MG  --   --   --  2.4  --   --    GFR: Estimated Creatinine Clearance: 69.7 mL/min (by C-G formula based on SCr of 0.92 mg/dL). Liver Function Tests: Recent Labs  Lab 08/03/17 1545 08/04/17 0115  AST 13* 13*  ALT 32 27  ALKPHOS 105 94  BILITOT 0.8 0.6  PROT 7.5 6.9  ALBUMIN 3.8 3.5   No results for input(s): LIPASE, AMYLASE in the last 168 hours. Recent Labs  Lab 08/03/17 1546  AMMONIA 24   Coagulation Profile: No results for input(s): INR, PROTIME in the last 168 hours. Cardiac Enzymes: Recent Labs  Lab 08/03/17 1545 08/03/17 1928 08/04/17 0115 08/04/17 0655  CKTOTAL 55  --   --   --   TROPONINI  --  0.05* 0.05* 0.04*   BNP (last 3 results) No results for input(s): PROBNP in the last 8760 hours. HbA1C: Recent Labs    08/03/17 1928 08/04/17 0115  HGBA1C 11.0* 11.1*   CBG: Recent Labs  Lab 08/05/17 1654 08/05/17 1835 08/05/17 2044 08/06/17 0732 08/06/17 1123  GLUCAP 408* 419* 271* 308* 348*   Lipid Profile: No results for input(s): CHOL, HDL, LDLCALC, TRIG, CHOLHDL, LDLDIRECT in the last 72 hours. Thyroid Function Tests: Recent Labs    08/03/17 1928  TSH 0.341*   Anemia Panel: No results for input(s): VITAMINB12, FOLATE, FERRITIN, TIBC, IRON, RETICCTPCT in the last 72 hours. Sepsis Labs: Recent Labs  Lab 08/03/17 1552 08/03/17 1725  LATICACIDVEN 2.08* 1.49    No results found for this or any previous visit (from the past 240 hour(s)).       Radiology Studies: No results found.      Scheduled Meds: . amLODipine  10 mg Oral Daily  . aspirin  325 mg Oral Daily  . atorvastatin  40 mg Oral Daily  . feeding supplement (GLUCERNA SHAKE)  237 mL Oral TID BM  . ferrous sulfate   325 mg Oral Daily  . heparin  5,000 Units Subcutaneous Q8H  . insulin aspart  0-20 Units Subcutaneous TID WC  . insulin aspart  0-5 Units Subcutaneous QHS  . insulin aspart protamine- aspart  40 Units Subcutaneous BID WC  . metoprolol tartrate  12.5 mg Oral BID   Continuous Infusions:    LOS: 3 days    Time spent: 35    Delaine LameShrey C Peggy Monk, MD [facility]  If 7PM-7AM, please contact night-coverage [email or web address] Password [email or web address] 08/06/2017, 1:44 PM

## 2017-08-06 NOTE — Progress Notes (Signed)
Per prior CM familiar w/patient-not a citizen, has been in jail, we have provided 30day LOG to SNF end of 2018, non-compliant. MD recc psych eval for IVC-but may not be useful since he is not a citizen & makes bad decisions.

## 2017-08-06 NOTE — Care Management Note (Signed)
Case Management Note  Patient Details  Name: Mike [name] MRN: [id number] Date of Birth: [birth year]  Subjective/Objective:56 y/o m admitted w/Uncontrolled DM. Homeless. Goes to [facility]-Mary [name] NP-provides medical assistance,meds, resources-per Mary [name]-behavior issues-urinates anywhere,poor judgement w/medical mgmnt,forgets to come in for insulin-she will connect him with [facility] after we asst w/MATCH program for insulin-Novolog flex pen,supplies(lancets,& strips)-1 x use/12 calendar months/$3 co pay(unable to afford)/select pharmacies. Will need transport voucher-await PT eval-recc.                     Action/Plan:d/c homeless shelter   Expected Discharge Date:                  Expected Discharge Plan:  Homeless Shelter  In-House Referral:  Clinical Social Work  Discharge planning Services  [facility]  Post Acute Care Choice:    Choice offered to:     DME Arranged:    DME Agency:     HH Arranged:    HH Agency:     Status of Service:  In process, will continue to follow  If discussed at Long Length of Stay Meetings, dates discussed:    Additional Comments:  Mike [name], Mike Wangerin, RN 08/06/2017, 12:58 PM

## 2017-08-07 DIAGNOSIS — E1165 Type 2 diabetes mellitus with hyperglycemia: Secondary | ICD-10-CM

## 2017-08-07 DIAGNOSIS — R413 Other amnesia: Secondary | ICD-10-CM

## 2017-08-07 DIAGNOSIS — Z9114 Patient's other noncompliance with medication regimen: Secondary | ICD-10-CM

## 2017-08-07 DIAGNOSIS — Z590 Homelessness: Secondary | ICD-10-CM

## 2017-08-07 DIAGNOSIS — E870 Hyperosmolality and hypernatremia: Secondary | ICD-10-CM

## 2017-08-07 LAB — BASIC METABOLIC PANEL
Anion gap: 4 — ABNORMAL LOW (ref 5–15)
BUN: 31 mg/dL — ABNORMAL HIGH (ref 6–20)
CO2: 30 mmol/L (ref 22–32)
Calcium: 9 mg/dL (ref 8.9–10.3)
GFR calc Af Amer: >60 mL/min (ref >60)
GFR calc non Af Amer: >60 mL/min (ref >60)
Glucose, Bld: 302 mg/dL — ABNORMAL HIGH (ref 70–99)

## 2017-08-07 LAB — GLUCOSE, CAPILLARY
GLUCOSE-CAPILLARY: 186 mg/dL — AB (ref 70–99)
GLUCOSE-CAPILLARY: 139 mg/dL — AB (ref 70–99)
GLUCOSE-CAPILLARY: 65 mg/dL — AB (ref 70–99)
GLUCOSE-CAPILLARY: 104 mg/dL — AB (ref 70–99)
Glucose-Capillary: 338 mg/dL — ABNORMAL HIGH (ref 70–99)
Glucose-Capillary: 34 mg/dL — CL (ref 70–99)

## 2017-08-07 LAB — BASIC METABOLIC PANEL WITH GFR
Chloride: 109 mmol/L (ref 98–111)
Creatinine, Ser: 0.88 mg/dL (ref 0.61–1.24)
Potassium: 4.7 mmol/L (ref 3.5–5.1)
Sodium: 143 mmol/L (ref 135–145)

## 2017-08-07 LAB — MAGNESIUM: Magnesium: 2.3 mg/dL (ref 1.7–2.4)

## 2017-08-07 MED ORDER — GLUCERNA SHAKE PO LIQD
237.0000 mL | Freq: Every day | ORAL | Status: DC | PRN
  Administered 2017-08-08 – 2017-08-09 (×2): 237 mL via ORAL
  Filled 2017-08-07 (×3): qty 237

## 2017-08-07 MED ORDER — GLUCERNA PO LIQD: 237.0000 mL | Freq: Every day | ORAL | Status: DC | PRN

## 2017-08-07 MED ORDER — ALBUTEROL SULFATE (2.5 MG/3ML) 0.083% IN NEBU: 2.5000 mg | INHALATION_SOLUTION | Freq: Four times a day (QID) | RESPIRATORY_TRACT | Status: DC | PRN

## 2017-08-07 MED ORDER — INSULIN ASPART PROT & ASPART (70-30 MIX) 100 UNIT/ML ~~LOC~~ SUSP
50.0000 [IU] | Freq: Two times a day (BID) | SUBCUTANEOUS | Status: DC
  Administered 2017-08-07: 50 [IU] via SUBCUTANEOUS

## 2017-08-07 MED ORDER — ALBUTEROL SULFATE (2.5 MG/3ML) 0.083% IN NEBU
INHALATION_SOLUTION | RESPIRATORY_TRACT | Status: AC
  Filled 2017-08-07: qty 3

## 2017-08-07 NOTE — Progress Notes (Signed)
PROGRESS NOTE    Mike [name]  WUJ:[id number] DOB: 1960-03-15 DOA: 08/03/2017 PCP: [name], NP    Brief Narrative:  57 year old with past medical history relevant for type 2 diabetes on insulin, hypertension, hyperlipidemia, iron deficiency anemia, homelessness admitted after being found down with hyperglycemic nonketotic state with severe hypernatremia and AKI.      Assessment & Plan:   Principal Problem:   Evaluation by psychiatric service required Active Problems:   Homelessness   Tobacco abuse   Uncontrolled type 2 diabetes mellitus with hyperosmolar nonketotic hyperglycemia (HCC)   Essential hypertension   Type 2 diabetes mellitus with hyperosmolar nonketotic hyperglycemia (HCC)   Hypernatremia   Memory difficulties  #1 hyperglycemic nonketotic state in the setting of type 2 diabetes Likely secondary to medication noncompliance.  Patient states that he does go to [facility] center in downtown [place] where his insulin is usually given to him at the site.  Patient states has not received insulin injections in a while prior to admission.  Hemoglobin A1c was 11.1.  CBG was 338 this morning. On admission patient was noted to have a profoundly elevated blood glucose during the hospitalization and had some subsequent low blood sugars.  Concern for brittle diabetes.  Patient noted to have had over 70 ER visits and 4 hospitalizations in the past 6 months.  Increase 70/30 to 50 units twice daily for better control.  Patient was seen by the diabetic coordinator who attempted to teach patient insulin administration via vial and syringe 5 times without successful return demonstration.  It was noted that patient needed a lot of prompting.  Continue sliding scale insulin.  Patient has been seen by psychiatry and patient lacks capacity.  Very difficult situation in terms of discharge planning.  [facility] for  continued management of his diabetes.  2.  Hypernatremia Likely secondary to volume depletion.  Improved with hydration.  Follow.  3.  Acute kidney injury Secondary to prerenal azotemia.  Resolved with hydration.  4.  Hypertension Stable.  Continue current regimen of Norvasc, lisinopril, Lopressor.  5.  Hyperlipidemia Continue statin.  6.  Memory issues CT head done on admission with advanced for age cerebral atrophy, periventricular white matter and corona radiata hypodensities favor chronic ischemic microvascular white matter disease.  Continue risk factor modification.  Check a RPR, B12, folate, HIV.  TSH was 0.341.  Check a free T4.  Follow.  7.  Homelessness Patient has been seen by PT was recommending SNF.  Social work following.  DVT prophylaxis: Heparin Code Status: Full Family Communication: Updated patient.  No family at bedside. Disposition Plan: Skilled nursing facility when bed available.   Consultants:   Psychiatry: Dr. Sharma Covert 08/07/2017    Procedures:   CT head without contrast 08/03/2017  2D echo 08/04/2017  Antimicrobials:   None   Subjective: Eating lunch.  Denies any chest pain no shortness of breath.  Objective: Vitals:   08/06/17 2123 08/07/17 0551 08/07/17 0832 08/07/17 1232  BP: 136/70 (!) 144/72 (!) 144/87 134/72  Pulse: 68 62 75 (!) 59  Resp: 15 16  16   Temp: 99.2 F (37.3 C) 98.2 F (36.8 C)  98 F (36.7 C)  TempSrc: Oral Oral  Oral  SpO2: 99% 98% 100% 100%  Weight:      Height:        Intake/Output Summary (Last 24 hours) at 08/07/2017 2002 Last data filed at 08/07/2017 1700 Gross per 24 hour  Intake 960 ml  Output 1900 ml  Net -940 ml   Filed Weights   08/03/17 1528 08/03/17 2009  Weight: 72.6 kg (160 lb) 54.3 kg (119 lb 11.4 oz)    Examination:  General exam: Appears calm and comfortable  Respiratory system: Clear to auscultation. Respiratory effort normal. Cardiovascular system: S1 & S2 heard, RRR. No JVD, murmurs,  rubs, gallops or clicks. No pedal edema. Gastrointestinal system: Abdomen is nondistended, soft and nontender. No organomegaly or masses felt. Normal bowel sounds heard. Central nervous system: Alert and oriented. No focal neurological deficits. Extremities: Symmetric 5 x 5 power. Skin: No rashes, lesions or ulcers Psychiatry: Judgement and insight appear normal. Mood & affect appropriate.     Data Reviewed: I have personally reviewed following labs and imaging studies  CBC: Recent Labs  Lab 08/03/17 1545 08/03/17 1928 08/04/17 0115 08/05/17 0605  WBC 7.1 8.4 8.3 9.7  NEUTROABS 4.8  --   --   --   HGB 11.9* 11.8* 12.0* 11.8*  HCT 38.3* 38.1* 39.1 37.8*  MCV 81.7 81.6 82.5 81.6  PLT 348 360 314 269   Basic Metabolic Panel: Recent Labs  Lab 08/04/17 1830 08/05/17 0027 08/05/17 0605 08/05/17 1311 08/06/17 0502 08/06/17 1745 08/07/17 0531  NA 151* 151* 150*  --  141  --  143  K 3.6 3.2* 3.3*  --  4.4  --  4.7  CL 116* 117* 116*  --  108  --  109  CO2 27 28 27   --  28  --  30  GLUCOSE 370* 114* 58* 439* 255* 465* 302*  BUN 63* 51* 47*  --  32*  --  31*  CREATININE 1.42* 1.13 1.15  --  0.92  --  0.88  CALCIUM 9.2 9.0 9.0  --  8.7*  --  9.0  MG  --   --  2.4  --   --   --  2.3   GFR: Estimated Creatinine Clearance: 72.8 mL/min (by C-G formula based on SCr of 0.88 mg/dL). Liver Function Tests: Recent Labs  Lab 08/03/17 1545 08/04/17 0115  AST 13* 13*  ALT 32 27  ALKPHOS 105 94  BILITOT 0.8 0.6  PROT 7.5 6.9  ALBUMIN 3.8 3.5   No results for input(s): LIPASE, AMYLASE in the last 168 hours. Recent Labs  Lab 08/03/17 1546  AMMONIA 24   Coagulation Profile: No results for input(s): INR, PROTIME in the last 168 hours. Cardiac Enzymes: Recent Labs  Lab 08/03/17 1545 08/03/17 1928 08/04/17 0115 08/04/17 0655  CKTOTAL 55  --   --   --   TROPONINI  --  0.05* 0.05* 0.04*   BNP (last 3 results) No results for input(s): PROBNP in the last 8760  hours. HbA1C: No results for input(s): HGBA1C in the last 72 hours. CBG: Recent Labs  Lab 08/06/17 1648 08/06/17 2123 08/07/17 0816 08/07/17 1141 08/07/17 1633  GLUCAP 409* 211* 338* 186* 139*   Lipid Profile: No results for input(s): CHOL, HDL, LDLCALC, TRIG, CHOLHDL, LDLDIRECT in the last 72 hours. Thyroid Function Tests: No results for input(s): TSH, T4TOTAL, FREET4, T3FREE, THYROIDAB in the last 72 hours. Anemia Panel: No results for input(s): VITAMINB12, FOLATE, FERRITIN, TIBC, IRON, RETICCTPCT in the last 72 hours. Sepsis Labs: Recent Labs  Lab 08/03/17 1552 08/03/17 1725  LATICACIDVEN 2.08* 1.49    No results found for this or any previous visit (from the past 240 hour(s)).       Radiology Studies: No results  found.      Scheduled Meds: . albuterol      . amLODipine  10 mg Oral Daily  . aspirin  325 mg Oral Daily  . atorvastatin  40 mg Oral Daily  . feeding supplement (GLUCERNA SHAKE)  237 mL Oral TID BM  . ferrous sulfate  325 mg Oral Daily  . heparin  5,000 Units Subcutaneous Q8H  . insulin aspart  0-20 Units Subcutaneous TID WC  . insulin aspart  0-5 Units Subcutaneous QHS  . insulin aspart protamine- aspart  50 Units Subcutaneous BID WC  . lisinopril  40 mg Oral Daily  . metoprolol tartrate  12.5 mg Oral BID   Continuous Infusions:   LOS: 4 days    Time spent: 35 minutes    Ramiro Harvestaniel Thompson, MD [facility] Pager [phone]  If 7PM-7AM, please contact night-coverage [email or web address] Password [email or web address] 08/07/2017, 8:02 PM

## 2017-08-07 NOTE — Consult Note (Signed)
[facility] Face-to-Face Psychiatry Consult   Reason for Consult:  Capacity evaluation  Referring Physician:  Dr. Grandville Silos  Patient Identification: Mike [name] MRN:  [id number] Principal Diagnosis: Evaluation by psychiatric service required Diagnosis:   Patient Active Problem List   Diagnosis Date Noted  . Type 2 diabetes mellitus with hyperosmolar nonketotic hyperglycemia ([place]) [E11.00] 08/03/2017  . Hyperglycemia [R73.9] 05/26/2017  . Noncompliance [Z91.19] 05/26/2017  . AKI (acute kidney injury) ([place]) [N17.9] 03/04/2017  . Malnutrition of moderate degree [E44.0] 02/08/2017  . DKA (diabetic ketoacidoses) ([place]) [E13.10] 02/07/2017  . History of stroke [Z86.73] 02/07/2017  . Hyperphosphatemia [E83.39] 12/13/2016  . Evaluation by psychiatric service required [Z00.8] 11/30/2016  . Lactic acidosis [E87.2] 11/18/2016  . Normocytic anemia [D64.9] 11/06/2016  . HLD (hyperlipidemia) [E78.5] 11/06/2016  . Adjustment disorder with other symptoms [F43.29] 11/05/2016  . Uncontrolled type 2 diabetes mellitus with hyperosmolar nonketotic hyperglycemia ([place]) [E11.00] 08/24/2016  . Essential hypertension [I10] 08/24/2016  . Acute ischemic stroke ([place]) [I63.9]   . Protein-calorie malnutrition, severe [E43] 12/24/2014  . Homelessness [Z59.0] 12/23/2014  . Hypertensive urgency [I16.0] 12/23/2014  . DM (diabetes mellitus) ([place]) [E11.9] 12/23/2014  . Intermittent palpitations [R00.2] 12/23/2014  . Tobacco abuse [Z72.0] 12/23/2014  . Pleuritic chest pain [R07.81] 12/23/2014  . Abnormal EKG [R94.31] 12/23/2014  . Type II diabetes mellitus with renal manifestations (HCC) [E11.29]     Total Time spent with patient: 1 hour  Subjective:   Mike [name] is a 57 y.o. male patient admitted with uncontrolled DM with hyperosmolar nonketotic hyperglycemia.  HPI:   Per chart review, patient was admitted with uncontrolled DM with hyperosmolar nonketotic hyperglycemia. He has a history of treatment  noncompliance in the setting of homelessness. He has been admitted to the hospital 6 times in the past 4 months and had 73 ED visits. He is recommended for SNF placement due to general weakness, decreased activity tolerance and impaired gait and balance.   On interview, Mike [name] reports medication noncompliance due to often losing his medications in the setting of homelessness.  He reports, "I do not know" when asked about why he is recommended to go to a SNF.  He denies problems with his mood.  He denies SI, HI or AVH.  He denies taking any psychiatric medications and does not have a psychiatrist.  He denies problems with sleep or appetite.  Past Psychiatric History: Anxiety and depression.   Risk to Self:  None. Denies SI.  Risk to Others:  None. Denies HI.  Prior Inpatient Therapy: He was hospitalized in [place] a year ago although he does not recall why.  Prior Outpatient Therapy:  He has received psychotropic medications for sleep and anxiety in the past.   Past Medical History:  Past Medical History:  Diagnosis Date  . Anxiety   . Anxiety   . Chronic lower back pain   . Depression   . DKA (diabetic ketoacidoses) ([place]) 07/30/2016  . Hyperlipemia   . Hypertension   . Migraine    "last one was ~ 4 yr ago" (12/23/2014)  . Seizures ([place])    "related to pills for anxiety; if I don't take the pills I'm suppose to take I'll have them" (12/23/2014)  . Type II diabetes mellitus ([place])     Past Surgical History:  Procedure Laterality Date  . NO PAST SURGERIES     Family History:  Family History  Problem Relation Age of Onset  . Diabetes Mellitus II Mother   . Diabetes Mellitus II Father  Family Psychiatric  History: Denies  Social History:  Social History   Substance and Sexual Activity  Alcohol Use Yes  . Alcohol/week: 1.2 oz  . Types: 2 Cans of beer per week   Comment: No labs available at time of assessment     Social History   Substance and Sexual Activity  Drug Use  No   Comment: 12/23/2014 "stopped ~ 10 yrs ago"    Social History   Socioeconomic History  . Marital status: Divorced    Spouse name: Not on file  . Number of children: Not on file  . Years of education: Not on file  . Highest education level: Not on file  Occupational History  . Not on file  Social Needs  . Financial resource strain: Not on file  . Food insecurity:    Worry: Not on file    Inability: Not on file  . Transportation needs:    Medical: Not on file    Non-medical: Not on file  Tobacco Use  . Smoking status: Current Every Day Smoker    Packs/day: 0.10    Years: 40.00    Pack years: 4.00    Types: Cigarettes  . Smokeless tobacco: Never Used  Substance and Sexual Activity  . Alcohol use: Yes    Alcohol/week: 1.2 oz    Types: 2 Cans of beer per week    Comment: No labs available at time of assessment  . Drug use: No    Comment: 12/23/2014 "stopped ~ 10 yrs ago"  . Sexual activity: Not Currently    Partners: Female  Lifestyle  . Physical activity:    Days per week: Not on file    Minutes per session: Not on file  . Stress: Not on file  Relationships  . Social connections:    Talks on phone: Not on file    Gets together: Not on file    Attends religious service: Not on file    Active member of club or organization: Not on file    Attends meetings of clubs or organizations: Not on file    Relationship status: Not on file  Other Topics Concern  . Not on file  Social History Narrative  . Not on file   Additional Social History: He is homeless. He is a devotee of Christianity. He denies illicit substance or alcohol use.     Allergies:   Allergies  Allergen Reactions  . Ibuprofen Nausea And Vomiting and Other (See Comments)    Makes the patient feel bloated also  . Tylenol [Acetaminophen] Nausea And Vomiting and Other (See Comments)    Makes the patient feel bloated also    Labs:  Results for orders placed or performed during the hospital encounter  of 08/03/17 (from the past 48 hour(s))  Glucose, capillary     Status: Abnormal   Collection Time: 08/05/17 11:52 AM  Result Value Ref Range   Glucose-Capillary 403 (H) 70 - 99 mg/dL  Glucose, random     Status: Abnormal   Collection Time: 08/05/17  1:11 PM  Result Value Ref Range   Glucose, Bld 439 (H) 70 - 99 mg/dL    Comment: Please note change in reference range. Performed at [facility], [place] [address], [place], [place] [address]   Glucose, capillary     Status: Abnormal   Collection Time: 08/05/17  4:54 PM  Result Value Ref Range   Glucose-Capillary 408 (H) 70 - 99 mg/dL  Glucose, capillary  Status: Abnormal   Collection Time: 08/05/17  6:35 PM  Result Value Ref Range   Glucose-Capillary 419 (H) 70 - 99 mg/dL  Glucose, capillary     Status: Abnormal   Collection Time: 08/05/17  8:44 PM  Result Value Ref Range   Glucose-Capillary 271 (H) 70 - 99 mg/dL  Basic metabolic panel     Status: Abnormal   Collection Time: 08/06/17  5:02 AM  Result Value Ref Range   Sodium 141 135 - 145 mmol/L    Comment: DELTA CHECK NOTED   Potassium 4.4 3.5 - 5.1 mmol/L    Comment: DELTA CHECK NOTED NO VISIBLE HEMOLYSIS    Chloride 108 98 - 111 mmol/L    Comment: Please note change in reference range.   CO2 28 22 - 32 mmol/L   Glucose, Bld 255 (H) 70 - 99 mg/dL    Comment: Please note change in reference range.   BUN 32 (H) 6 - 20 mg/dL    Comment: Please note change in reference range.   Creatinine, Ser 0.92 0.61 - 1.24 mg/dL   Calcium 8.7 (L) 8.9 - 10.3 mg/dL   GFR calc non Af Amer >60 >60 mL/min   GFR calc Af Amer >60 >60 mL/min    Comment: (NOTE) The eGFR has been calculated using the CKD EPI equation. This calculation has not been validated in all clinical situations. eGFR's persistently <60 mL/min signify possible Chronic Kidney Disease.    Anion gap 5 5 - 15    Comment: Performed at [facility], [place] [address], [place], [place]  [address]  Glucose, capillary     Status: Abnormal   Collection Time: 08/06/17  7:32 AM  Result Value Ref Range   Glucose-Capillary 308 (H) 70 - 99 mg/dL  Glucose, capillary     Status: Abnormal   Collection Time: 08/06/17 11:23 AM  Result Value Ref Range   Glucose-Capillary 348 (H) 70 - 99 mg/dL  Glucose, capillary     Status: Abnormal   Collection Time: 08/06/17  4:48 PM  Result Value Ref Range   Glucose-Capillary 409 (H) 70 - 99 mg/dL  Glucose, random     Status: Abnormal   Collection Time: 08/06/17  5:45 PM  Result Value Ref Range   Glucose, Bld 465 (H) 70 - 99 mg/dL    Comment: Performed at [facility], [place] [address], [place], [place] [address]  Glucose, capillary     Status: Abnormal   Collection Time: 08/06/17  9:23 PM  Result Value Ref Range   Glucose-Capillary 211 (H) 70 - 99 mg/dL  Basic metabolic panel     Status: Abnormal   Collection Time: 08/07/17  5:31 AM  Result Value Ref Range   Sodium 143 135 - 145 mmol/L   Potassium 4.7 3.5 - 5.1 mmol/L   Chloride 109 98 - 111 mmol/L   CO2 30 22 - 32 mmol/L   Glucose, Bld 302 (H) 70 - 99 mg/dL   BUN 31 (H) 6 - 20 mg/dL   Creatinine, Ser 0.88 0.61 - 1.24 mg/dL   Calcium 9.0 8.9 - 10.3 mg/dL   GFR calc non Af Amer >60 >60 mL/min   GFR calc Af Amer >60 >60 mL/min    Comment: (NOTE) The eGFR has been calculated using the CKD EPI equation. This calculation has not been validated in all clinical situations. eGFR's persistently <60 mL/min signify possible Chronic Kidney Disease.    Anion gap 4 (L) 5 -  15    Comment: Performed at [facility], [place] [address] [place], [place] [address]  Magnesium     Status: None   Collection Time: 08/07/17  5:31 AM  Result Value Ref Range   Magnesium 2.3 1.7 - 2.4 mg/dL    Comment: Performed at [facility], [place] [address] [place], [place] [address]  Glucose, capillary     Status: Abnormal   Collection Time: 08/07/17  8:16 AM  Result  Value Ref Range   Glucose-Capillary 338 (H) 70 - 99 mg/dL    Current Facility-Administered Medications  Medication Dose Route Frequency Provider Last Rate Last Dose  . amLODipine (NORVASC) tablet 10 mg  10 mg Oral Daily Elwyn Reach, MD   10 mg at 08/07/17 0837  . aspirin tablet 325 mg  325 mg Oral Daily Elwyn Reach, MD   325 mg at 08/07/17 5852  . atorvastatin (LIPITOR) tablet 40 mg  40 mg Oral Daily Elwyn Reach, MD   40 mg at 08/07/17 7782  . feeding supplement (GLUCERNA SHAKE) (GLUCERNA SHAKE) liquid 237 mL  237 mL Oral TID BM Purohit, Shrey C, MD   237 mL at 08/07/17 0838  . ferrous sulfate tablet 325 mg  325 mg Oral Daily Gala Romney L, MD   325 mg at 08/07/17 0838  . heparin injection 5,000 Units  5,000 Units Subcutaneous Q8H Elwyn Reach, MD   5,000 Units at 08/07/17 0525  . insulin aspart (novoLOG) injection 0-20 Units  0-20 Units Subcutaneous TID WC Elwyn Reach, MD   15 Units at 08/07/17 [phone]  . insulin aspart (novoLOG) injection 0-5 Units  0-5 Units Subcutaneous QHS Elwyn Reach, MD   2 Units at 08/06/17 2202  . insulin aspart protamine- aspart (NOVOLOG MIX 70/30) injection 50 Units  50 Units Subcutaneous BID WC Green, Terri L, RPH      . lisinopril (PRINIVIL,ZESTRIL) tablet 40 mg  40 mg Oral Daily Purohit, Shrey C, MD   40 mg at 08/07/17 3614  . metoprolol tartrate (LOPRESSOR) tablet 12.5 mg  12.5 mg Oral BID Gala Romney L, MD   12.5 mg at 08/07/17 4315    Musculoskeletal: Strength & Muscle Tone: within normal limits Gait & Station: UTA since patient was lying in bed. Patient leans: N/A  Psychiatric Specialty Exam: Physical Exam  Nursing note and vitals reviewed. Constitutional: He is oriented to person, place, and time. He appears well-developed and well-nourished.  HENT:  Head: Normocephalic and atraumatic.  Neck: Normal range of motion.  Respiratory: Effort normal.  Musculoskeletal: Normal range of motion.  Neurological: He is  alert and oriented to person, place, and time.  Skin: No rash noted.  Psychiatric: He has a normal mood and affect. His speech is normal and behavior is normal. Judgment and thought content normal. Cognition and memory are impaired.    Review of Systems  Constitutional: Negative for chills and fever.  Cardiovascular: Negative for chest pain.  Gastrointestinal: Negative for abdominal pain, constipation, diarrhea, nausea and vomiting.  Psychiatric/Behavioral: Negative for depression, hallucinations, substance abuse and suicidal ideas. The patient does not have insomnia.   All other systems reviewed and are negative.   Blood pressure (!) 144/87, pulse 75, temperature 98.2 F (36.8 C), temperature source Oral, resp. rate 16, height '6\' 4"'$  (1.93 m), weight 54.3 kg (119 lb 11.4 oz), SpO2 100 %.Body mass index is 14.57 kg/m.  General Appearance: Disheveled, middle aged, [place] male, wearing a hospital gown  with matted locs and lying in bed. NAD.   Eye Contact:  Good  Speech:  Clear and Coherent and Normal Rate  Volume:  Normal  Mood:  Euthymic  Affect:  Constricted  Thought Process:  Goal Directed, Linear and Descriptions of Associations: Intact  Orientation:  Full (Time, Place, and Person)  Thought Content:  Logical  Suicidal Thoughts:  No  Homicidal Thoughts:  No  Memory:  Immediate;   Good Recent;   Good Remote;   Good  Judgement:  Poor  Insight:  Poor  Psychomotor Activity:  Normal  Concentration:  Concentration: Good and Attention Span: Good  Recall:  Good  Fund of Knowledge:  Poor  Language:  Good  Akathisia:  No  Handed:  Right  AIMS (if indicated):   N/A  Assets:  Communication Skills  ADL's:  Intact  Cognition: Impaired. He lacks insight about his medical condition and the importance of medication compliance.   Sleep:   Okay   Assessment:  Mike [name] is a 57 y.o. male who was admitted with uncontrolled DM with hyperosmolar nonketotic hyperglycemia. He lacks  capacity to refuse SNF placement. He does not understand why placement is recommended and is unable to rationally manipulate information. He will need an authorized representative to help him make these decisions. He has a son in [place].   Treatment Plan Summary: -Patient lacks capacity to refuse SNF placement.  -Psychiatry will sign off on patient at this time. Please consult psychiatry again as needed.   Disposition: No evidence of imminent risk to self or others at present.   Patient does not meet criteria for psychiatric inpatient admission.  Faythe Dingwall, DO 08/07/2017 9:35 AM

## 2017-08-07 NOTE — Progress Notes (Addendum)
Inpatient Diabetes Program Recommendations  AACE/ADA: New Consensus Statement on Inpatient Glycemic Control (2015)  Target Ranges:  Prepandial:   less than 140 mg/dL      Peak postprandial:   less than 180 mg/dL (1-2 hours)      Critically ill patients:  140 - 180 mg/dL   Attempted to teach patient insulin administration via vial and syringe 5 times without successful return demonstration. Patient needed a lot of prompting.  Tried looking up the NP that the patient reports he uses, [name], Through a Microbiologistgoogle search. I found an NP with [facility], when I called they do not have a practitioner by that name.  The clinic [facility] at [address], [place]. Clinic offers a Sports coachCase Manager, Medical clinic, and a mental health RN. Unfortunately the clinic is open from 8-3 pm, M-F. so I am unable to inquire more information at this time in regards to patient consistently going to get insulin injections from this site. [phone].  Thanks,  Christena DeemShannon Joaopedro Eschbach RN, MSN, BC-ADM, [facility] Inpatient Diabetes Coordinator Team Pager [phone] (8a-5p)

## 2017-08-07 NOTE — Progress Notes (Signed)
Hypoglycemic Event  CBG: 34  Treatment: 15 GM carbohydrate snack  Symptoms: Sweaty and Hungry  Follow-up CBG: Time:2228, 2253 CBG Result:65, 104   Possible Reasons for Event: Inadequate meal intake and Medication regimen: insulin  Comments/MD notified:On call provider notified- K Schorr    Mike [name]

## 2017-08-08 LAB — BASIC METABOLIC PANEL
Anion gap: 5 (ref 5–15)
BUN: 25 mg/dL — AB (ref 6–20)
CALCIUM: 8.9 mg/dL (ref 8.9–10.3)
CO2: 29 mmol/L (ref 22–32)
CREATININE: 0.66 mg/dL (ref 0.61–1.24)
Chloride: 108 mmol/L (ref 98–111)
GFR calc Af Amer: >60 mL/min (ref >60)
GLUCOSE: 116 mg/dL — AB (ref 70–99)
POTASSIUM: 4.5 mmol/L (ref 3.5–5.1)
SODIUM: 142 mmol/L (ref 135–145)

## 2017-08-08 LAB — GLUCOSE, CAPILLARY
GLUCOSE-CAPILLARY: 133 mg/dL — AB (ref 70–99)
Glucose-Capillary: 230 mg/dL — ABNORMAL HIGH (ref 70–99)
Glucose-Capillary: 364 mg/dL — ABNORMAL HIGH (ref 70–99)
Glucose-Capillary: 43 mg/dL — CL (ref 70–99)
Glucose-Capillary: 324 mg/dL — ABNORMAL HIGH (ref 70–99)

## 2017-08-08 LAB — RPR: RPR: NONREACTIVE

## 2017-08-08 LAB — HIV ANTIBODY (ROUTINE TESTING W REFLEX): HIV Screen 4th Generation wRfx: NONREACTIVE

## 2017-08-08 LAB — T4, FREE: Free T4: 0.98 ng/dL (ref 0.82–1.77)

## 2017-08-08 LAB — VITAMIN B12: Vitamin B-12: 725 pg/mL (ref 180–914)

## 2017-08-08 MED ORDER — INSULIN ASPART 100 UNIT/ML ~~LOC~~ SOLN
0.0000 [IU] | Freq: Three times a day (TID) | SUBCUTANEOUS | Status: DC
  Administered 2017-08-09: 9 [IU] via SUBCUTANEOUS

## 2017-08-08 MED ORDER — INSULIN ASPART PROT & ASPART (70-30 MIX) 100 UNIT/ML ~~LOC~~ SUSP
35.0000 [IU] | Freq: Two times a day (BID) | SUBCUTANEOUS | Status: DC
  Administered 2017-08-08: 35 [IU] via SUBCUTANEOUS

## 2017-08-08 MED ORDER — INSULIN ASPART 100 UNIT/ML ~~LOC~~ SOLN
0.0000 [IU] | Freq: Every day | SUBCUTANEOUS | Status: DC
  Administered 2017-08-08: 4 [IU] via SUBCUTANEOUS

## 2017-08-08 MED ORDER — INSULIN GLARGINE 100 UNIT/ML ~~LOC~~ SOLN
10.0000 [IU] | Freq: Every morning | SUBCUTANEOUS | Status: DC
  Administered 2017-08-09: 10 [IU] via SUBCUTANEOUS
  Filled 2017-08-08: qty 0.1

## 2017-08-08 NOTE — Progress Notes (Signed)
PROGRESS NOTE    Mike [name]  ZOX:[id number] DOB: 1961/01/09 DOA: 08/03/2017 PCP: [name], NP    Brief Narrative:  57 year old with past medical history relevant for type 2 diabetes on insulin, hypertension, hyperlipidemia, iron deficiency anemia, homelessness admitted after being found down with hyperglycemic nonketotic state with severe hypernatremia and AKI.      Assessment & Plan:   Principal Problem:   Evaluation by psychiatric service required Active Problems:   Homelessness   Tobacco abuse   Uncontrolled type 2 diabetes mellitus with hyperosmolar nonketotic hyperglycemia (HCC)   Essential hypertension   Type 2 diabetes mellitus with hyperosmolar nonketotic hyperglycemia (HCC)   Hypernatremia   Memory difficulties  #1 hyperglycemic nonketotic state in the setting of type 2 diabetes Likely secondary to medication noncompliance.  Patient states that he does go to [facility] center in downtown [place] where his insulin is usually given to him at the site.  Patient states has not received insulin injections in a while prior to admission.  Hemoglobin A1c was 11.1.  CBG of 65 last night 08/07/2017.  CBG of 43 this afternoon.  Patient 70/30 was decreased this morning to 35 units twice daily.  Patient diabetes seems very brittle.   On admission patient was noted to have a profoundly elevated blood glucose during the hospitalization and had some subsequent low blood sugars.  Concern for brittle diabetes.  Patient noted to have had over 70 ER visits and 4 hospitalizations in the past 6 months.  Due to low CBGs last night and today we will discontinue 70/30.  Change sliding scale insulin to sensitive scale.  Will start patient on Lantus 10 units daily starting tomorrow morning 08/09/2017.   Patient was seen by the diabetic coordinator who attempted to teach patient insulin administration via vial and syringe 5 times without successful return demonstration.  It was  noted that patient needed a lot of prompting.  Continue sliding scale insulin.  Patient has been seen by psychiatry and patient lacks capacity.  Very difficult situation in terms of discharge planning.  [facility] need [facility] for continued management of his diabetes.  2.  Hypernatremia Likely secondary to volume depletion.  Improved with hydration.  Follow.  3.  Acute kidney injury Secondary to prerenal azotemia.  Resolved with hydration.  4.  Hypertension Stable.  Continue current regimen of Norvasc, lisinopril, Lopressor.  5.  Hyperlipidemia Continue statin.  6.  Memory issues CT head done on admission with advanced for age cerebral atrophy, periventricular white matter and corona radiata hypodensities favor chronic ischemic microvascular white matter disease.  Continue risk factor modification.  TSH of 0.341.  Free T4 of 0.98.  Vitamin B12 is 725.  RPR was nonreactive.  HIV nonreactive. Follow.  7.  Homelessness Patient has been seen by PT was recommending SNF.  Social work following.  DVT prophylaxis: Heparin Code Status: Full Family Communication: Updated patient.  No family at bedside. Disposition Plan: Skilled nursing facility when bed available.   Consultants:   Psychiatry: Dr. Sharma Covert 08/07/2017    Procedures:   CT head without contrast 08/03/2017  2D echo 08/04/2017  Antimicrobials:   None   Subjective: Patient in chair.  Denies any chest pain or shortness of breath.  Patient noted to have a CBG of 65 at 2228 hrs. on 08/07/2017.  Blood glucose this morning at 230.  Patient also noted to have a CBG of 43 at 1622 today.  Repeat CBG in  the 130s.   Objective: Vitals:   08/07/17 0832 08/07/17 1232 08/07/17 2131 08/08/17 0431  BP: (!) 144/87 134/72 (!) 158/60 136/74  Pulse: 75 (!) 59 79 65  Resp:  16 16 18   Temp:  98 F (36.7 C) 97.8 F (36.6 C) 98.3 F (36.8 C)  TempSrc:  Oral Oral Oral  SpO2: 100% 100% 99% 100%    Weight:      Height:        Intake/Output Summary (Last 24 hours) at 08/08/2017 1144 Last data filed at 08/08/2017 0600 Gross per 24 hour  Intake 1320 ml  Output 900 ml  Net 420 ml   Filed Weights   08/03/17 1528 08/03/17 2009  Weight: 72.6 kg (160 lb) 54.3 kg (119 lb 11.4 oz)    Examination:  General exam: Appears calm and comfortable  Respiratory system: Lungs clear to auscultation bilaterally.  No wheezes, no crackles, no rhonchi.  Respiratory effort normal. Cardiovascular system: Regular rate and rhythm no murmurs rubs or gallops.  No JVD.  No lower extremity edema.   Gastrointestinal system: Abdomen is nondistended, soft and nontender. No organomegaly or masses felt. Normal bowel sounds heard. Central nervous system: Alert and oriented. No focal neurological deficits. Extremities: Symmetric 5 x 5 power. Skin: No rashes, lesions or ulcers Psychiatry: Judgement and insight appear normal. Mood & affect appropriate.     Data Reviewed: I have personally reviewed following labs and imaging studies  CBC: Recent Labs  Lab 08/03/17 1545 08/03/17 1928 08/04/17 0115 08/05/17 0605  WBC 7.1 8.4 8.3 9.7  NEUTROABS 4.8  --   --   --   HGB 11.9* 11.8* 12.0* 11.8*  HCT 38.3* 38.1* 39.1 37.8*  MCV 81.7 81.6 82.5 81.6  PLT 348 360 314 269   Basic Metabolic Panel: Recent Labs  Lab 08/05/17 0027 08/05/17 0605 08/05/17 1311 08/06/17 0502 08/06/17 1745 08/07/17 0531 08/08/17 0540  NA 151* 150*  --  141  --  143 142  K 3.2* 3.3*  --  4.4  --  4.7 4.5  CL 117* 116*  --  108  --  109 108  CO2 28 27  --  28  --  30 29  GLUCOSE 114* 58* 439* 255* 465* 302* 116*  BUN 51* 47*  --  32*  --  31* 25*  CREATININE 1.13 1.15  --  0.92  --  0.88 0.66  CALCIUM 9.0 9.0  --  8.7*  --  9.0 8.9  MG  --  2.4  --   --   --  2.3  --    GFR: Estimated Creatinine Clearance: 80.1 mL/min (by C-G formula based on SCr of 0.66 mg/dL). Liver Function Tests: Recent Labs  Lab 08/03/17 1545  08/04/17 0115  AST 13* 13*  ALT 32 27  ALKPHOS 105 94  BILITOT 0.8 0.6  PROT 7.5 6.9  ALBUMIN 3.8 3.5   No results for input(s): LIPASE, AMYLASE in the last 168 hours. Recent Labs  Lab 08/03/17 1546  AMMONIA 24   Coagulation Profile: No results for input(s): INR, PROTIME in the last 168 hours. Cardiac Enzymes: Recent Labs  Lab 08/03/17 1545 08/03/17 1928 08/04/17 0115 08/04/17 0655  CKTOTAL 55  --   --   --   TROPONINI  --  0.05* 0.05* 0.04*   BNP (last 3 results) No results for input(s): PROBNP in the last 8760 hours. HbA1C: No results for input(s): HGBA1C in the last 72 hours. CBG: Recent Labs  Lab 08/07/17 2151 08/07/17 2228 08/07/17 2253 08/08/17 0736 08/08/17 1112  GLUCAP 34* 65* 104* 230* 364*   Lipid Profile: No results for input(s): CHOL, HDL, LDLCALC, TRIG, CHOLHDL, LDLDIRECT in the last 72 hours. Thyroid Function Tests: Recent Labs    08/08/17 0540  FREET4 0.98   Anemia Panel: Recent Labs    08/08/17 0540  VITAMINB12 725   Sepsis Labs: Recent Labs  Lab 08/03/17 1552 08/03/17 1725  LATICACIDVEN 2.08* 1.49    No results found for this or any previous visit (from the past 240 hour(s)).       Radiology Studies: No results found.      Scheduled Meds: . amLODipine  10 mg Oral Daily  . aspirin  325 mg Oral Daily  . atorvastatin  40 mg Oral Daily  . feeding supplement (GLUCERNA SHAKE)  237 mL Oral TID BM  . ferrous sulfate  325 mg Oral Daily  . heparin  5,000 Units Subcutaneous Q8H  . insulin aspart  0-20 Units Subcutaneous TID WC  . insulin aspart  0-5 Units Subcutaneous QHS  . insulin aspart protamine- aspart  35 Units Subcutaneous BID WC  . lisinopril  40 mg Oral Daily  . metoprolol tartrate  12.5 mg Oral BID   Continuous Infusions:   LOS: 5 days    Time spent: 35 minutes    Ramiro Harvestaniel Taeshaun Rames, MD [facility] Pager [phone]  If 7PM-7AM, please contact night-coverage [email or web address] Password  [facility] [email or web address] 08/08/2017, 11:44 AM

## 2017-08-08 NOTE — Progress Notes (Signed)
Hypoglycemic Event  CBG: 43  Treatment: 15 GM carbohydrate snack  Symptoms: Shaky and weak per patient  Follow-up CBG: Time:1708 CBG Result:133  Possible Reasons for Event: Unknown  Comments/MD notified:Thompson/ Orders received    Shaune Westfall A

## 2017-08-08 NOTE — Progress Notes (Signed)
Noted PT recc SNF-[facility] spoke to [name] NP about providing services in the otpt setting-patient on Lantus which he can get once a day they can managIf e @ [facility]-patient is on the MAP program already-they  provide his meds through the health dept. Chales AbrahamsMary Ann will check into the other shelters if 1 will be able to accept patient. She says patient has public behavior decency issues-urinating where he's not supposed to.not eligible for the PATH program currently-no mental health dx. No family support system. MD/Nsg updated.

## 2017-08-08 NOTE — Progress Notes (Signed)
CSW following to assist with discharge planning. Per chart review, PT recommending SNF. Patient walked 60 feet with moderate assist and was unsteady. Patient was originally assessed on 7/21 and was provided with shelter resources. Per chart review, patient is uninsured and is homeless. CSW spoke with patient at bedside regarding discharge planning and PT recommendation to SNF. CSW inquired about patient's income and insurance. Patient reported that he didn't have either. Patient confirmed that he is currently homeless and has no family/friends in the area. [facility]. CSW informed patient that due to no payor source SNF is currently not an option. CSW asked patient if he had any questions, patient declined and asked for a shake to drink.   CSW staffed patient's case with CSW ChiropodistAssistant Director. CSW inquired about the ability for patient to discharge patient to SNF. CSW ChiropodistAssistant Director reported that an LOG is not an option and suggested that patient be discharged back to community with community support.   CSW updated patient's RNCM, patient's RNCM to follow up with patient regarding discharge back to community with support.  CSW updated patient's attending MD. Patient's attending MD reported that patient is currently unsafe to discharge back to shelter at this time due to weakness. Attending MD suggested that PT work with patient again to try to help patient regain strength.   PT to see patient again.  Patient's RNCM following to assist with discharge planning, CSW signing off. Please reconsult if new needs arise.   Celso SickleKimberly Chukwudi Ewen, ConnecticutLCSWA Clinical Social Worker [facility] Cell#: [phone]

## 2017-08-08 NOTE — Progress Notes (Signed)
Physical Therapy Treatment Patient Details Name: Mike [name] MRN: [id number] DOB: 10-24-1960 Today's Date: 08/08/2017    History of Present Illness 57 yo male admitted with uncontrolled DM. Hx of medical noncompliance, HTN, depression, anxiety, Sz, CKD, DM. Pt is homeless.     PT Comments    Progressing with mobility. Pt remains unsteady and at risk for falls. Poor safety awareness. Continue to recommend SNF.    Follow Up Recommendations  SNF     Equipment Recommendations  None recommended by PT    Recommendations for Other Services       Precautions / Restrictions Precautions Precautions: Fall Restrictions Weight Bearing Restrictions: No    Mobility  Bed Mobility Overal bed mobility: Needs Assistance Bed Mobility: Supine to Sit;Sit to Supine     Supine to sit: Supervision Sit to supine: Supervision   General bed mobility comments: for safety  Transfers Overall transfer level: Needs assistance Equipment used: Rolling walker (2 wheeled) Transfers: Sit to/from Stand Sit to Stand: Min assist         General transfer comment: Assist to rise, stabilize, control descent. VCS safety, hand placement.   Ambulation/Gait Ambulation/Gait assistance: Min assist Gait Distance (Feet): 115 Feet Assistive device: Rolling walker (2 wheeled) Gait Pattern/deviations: Step-through pattern;Scissoring;Narrow base of support     General Gait Details: Assist to stabilize pt throughout distance and to maneuver with RW. Cues for safety, proper use of RW. Pt is at risk for falls currently   Stairs             Wheelchair Mobility    Modified Rankin (Stroke Patients Only)       Balance Overall balance assessment: Needs assistance         Standing balance support: Bilateral upper extremity supported Standing balance-Leahy Scale: Poor                              Cognition Arousal/Alertness: Awake/alert Behavior During Therapy: WFL for tasks  assessed/performed Overall Cognitive Status: No family/caregiver present to determine baseline cognitive functioning                                        Exercises      General Comments        Pertinent Vitals/Pain Pain Assessment: No/denies pain    Home Living                      Prior Function            PT Goals (current goals can now be found in the care plan section) Progress towards PT goals: Progressing toward goals    Frequency    Min 2X/week      PT Plan Frequency needs to be updated    Co-evaluation              AM-PAC PT "6 Clicks" Daily Activity  Outcome Measure  Difficulty turning over in bed (including adjusting bedclothes, sheets and blankets)?: None Difficulty moving from lying on back to sitting on the side of the bed? : None Difficulty sitting down on and standing up from a chair with arms (e.g., wheelchair, bedside commode, etc,.)?: Unable Help needed moving to and from a bed to chair (including a wheelchair)?: A Little Help needed walking in hospital room?: A Little Help needed climbing 3-5 steps with  a railing? : A Lot 6 Click Score: 17    End of Session Equipment Utilized During Treatment: Gait belt Activity Tolerance: Patient tolerated treatment well Patient left: in bed;with call bell/phone within reach;with bed alarm set   PT Visit Diagnosis: Muscle weakness (generalized) (M62.81);Difficulty in walking, not elsewhere classified (R26.2);Unsteadiness on feet (R26.81)     Time: 1610-9604 PT Time Calculation (min) (ACUTE ONLY): 12 min  Charges:  $Gait Training: 8-22 mins                    G Codes:         Rebeca Alert, MPT Pager: [phone]

## 2017-08-08 NOTE — Progress Notes (Addendum)
Inpatient Diabetes Program Recommendations  AACE/ADA: New Consensus Statement on Inpatient Glycemic Control (2015)  Target Ranges:  Prepandial:   less than 140 mg/dL      Peak postprandial:   less than 180 mg/dL (1-2 hours)      Critically ill patients:  140 - 180 mg/dL   Results for Mike [name], Mike [name] (MRN [id number]) as of 08/08/2017 13:26  Ref. Range 08/07/2017 08:16 08/07/2017 11:41 08/07/2017 16:33 08/07/2017 21:51 08/07/2017 22:28 08/07/2017 22:53  Glucose-Capillary Latest Ref Range: 70 - 99 mg/dL 528338 (H) 413186 (H) 244139 (H) 34 (LL) 65 (L) 104 (H)   Results for Mike [name], Mike [name] (MRN [id number]) as of 08/08/2017 13:26  Ref. Range 08/08/2017 07:36 08/08/2017 11:12  Glucose-Capillary Latest Ref Range: 70 - 99 mg/dL 644230 (H) 034364 (H)    Home DM Meds: 70/30 Insulin- 30 units BID (Does NOT take on regular basis)  Current Insulin Orders: 70/30 Insulin- 35 units BID      Novolog Resistant Correction Scale/ SSI (0-20 units) TID AC + HS     Well known to the Inpatient DM Program.  Innumerable ED visits coupled with Psych Issues and Noncompliance and Homeless Issues as an outpatient.     MD- Note that Patient had Severe Hypoglycemic events last PM at bedtime after receiving 50 units 70/30 Insulin at supper.  70/30 Insulin reduced to 35 units BID as a result.  Now patient with elevated CBGs today.  It may be necessary to allow for a bit more liberal CBG control in this patient, as he will likely not take his insulin consistently when he discharges.  Please consider the following:  1. Increase 70/30 Insulin slightly to 40 units BID  2. Reduce Novolog SSI to Moderate scale (0-15 units) TID AC + HS     --Will follow patient during hospitalization--  Ambrose FinlandJeannine Johnston Marisabel Macpherson RN, MSN, CDE Diabetes Coordinator Inpatient Glycemic Control Team Team Pager: [phone] (8a-5p)

## 2017-08-09 DIAGNOSIS — K921 Melena: Secondary | ICD-10-CM

## 2017-08-09 LAB — HEMOGLOBIN AND HEMATOCRIT, BLOOD
HCT: 27.1 % — ABNORMAL LOW (ref 39.0–52.0)
Hemoglobin: 8.7 g/dL — ABNORMAL LOW (ref 13.0–17.0)

## 2017-08-09 LAB — GLUCOSE, CAPILLARY
GLUCOSE-CAPILLARY: 368 mg/dL — AB (ref 70–99)
GLUCOSE-CAPILLARY: 336 mg/dL — AB (ref 70–99)
Glucose-Capillary: 362 mg/dL — ABNORMAL HIGH (ref 70–99)
Glucose-Capillary: 311 mg/dL — ABNORMAL HIGH (ref 70–99)

## 2017-08-09 LAB — FOLATE RBC
Folate, Hemolysate: 384.5 ng/mL
Folate, RBC: 1169 ng/mL (ref >498)
Hematocrit: 32.9 % — ABNORMAL LOW (ref 37.5–51.0)

## 2017-08-09 MED ORDER — INSULIN ASPART 100 UNIT/ML ~~LOC~~ SOLN: 0.0000 [IU] | SUBCUTANEOUS | Status: DC

## 2017-08-09 MED ORDER — SENNOSIDES-DOCUSATE SODIUM 8.6-50 MG PO TABS
1.0000 | ORAL_TABLET | Freq: Two times a day (BID) | ORAL | Status: DC
Start: 2017-08-09 — End: 2017-08-16
  Administered 2017-08-09 – 2017-08-16 (×15): 1 via ORAL
  Filled 2017-08-09 (×15): qty 1

## 2017-08-09 MED ORDER — INSULIN GLARGINE 100 UNIT/ML ~~LOC~~ SOLN
14.0000 [IU] | Freq: Every morning | SUBCUTANEOUS | Status: DC
  Administered 2017-08-10 – 2017-08-11 (×2): 14 [IU] via SUBCUTANEOUS
  Filled 2017-08-09 (×2): qty 0.14

## 2017-08-09 MED ORDER — POLYETHYLENE GLYCOL 3350 17 G PO PACK
17.0000 g | PACK | Freq: Every day | ORAL | Status: DC
  Administered 2017-08-09 – 2017-08-16 (×8): 17 g via ORAL
  Filled 2017-08-09 (×8): qty 1

## 2017-08-09 MED ORDER — INSULIN ASPART 100 UNIT/ML ~~LOC~~ SOLN
0.0000 [IU] | SUBCUTANEOUS | Status: DC
  Administered 2017-08-09: 7 [IU] via SUBCUTANEOUS
  Administered 2017-08-09: 5 [IU] via SUBCUTANEOUS
  Administered 2017-08-09: 7 [IU] via SUBCUTANEOUS
  Administered 2017-08-10: 9 [IU] via SUBCUTANEOUS
  Administered 2017-08-10: 3 [IU] via SUBCUTANEOUS
  Administered 2017-08-10: 2 [IU] via SUBCUTANEOUS
  Administered 2017-08-10: 7 [IU] via SUBCUTANEOUS
  Administered 2017-08-10: 1 [IU] via SUBCUTANEOUS
  Administered 2017-08-10: 3 [IU] via SUBCUTANEOUS
  Administered 2017-08-11 (×2): 2 [IU] via SUBCUTANEOUS
  Administered 2017-08-11: 5 [IU] via SUBCUTANEOUS
  Administered 2017-08-11: 9 [IU] via SUBCUTANEOUS
  Administered 2017-08-11: 2 [IU] via SUBCUTANEOUS
  Administered 2017-08-12: 5 [IU] via SUBCUTANEOUS
  Administered 2017-08-12: 1 [IU] via SUBCUTANEOUS
  Administered 2017-08-12: 3 [IU] via SUBCUTANEOUS
  Administered 2017-08-12: 1 [IU] via SUBCUTANEOUS
  Administered 2017-08-12: 2 [IU] via SUBCUTANEOUS
  Administered 2017-08-12: 5 [IU] via SUBCUTANEOUS
  Administered 2017-08-13: 7 [IU] via SUBCUTANEOUS
  Administered 2017-08-13 – 2017-08-14 (×2): 1 [IU] via SUBCUTANEOUS
  Administered 2017-08-14 (×2): 5 [IU] via SUBCUTANEOUS
  Administered 2017-08-14 – 2017-08-15 (×3): 2 [IU] via SUBCUTANEOUS
  Administered 2017-08-15: 9 [IU] via SUBCUTANEOUS

## 2017-08-09 MED ORDER — HYDROCORTISONE ACETATE 25 MG RE SUPP
25.0000 mg | Freq: Two times a day (BID) | RECTAL | Status: DC
  Administered 2017-08-15: 25 mg via RECTAL
  Filled 2017-08-09 (×15): qty 1

## 2017-08-09 NOTE — Progress Notes (Signed)
Received return f/u on possible shelters that may accept patient  [facility] AbrahamsMary Ann [name][facility], or Tommi EmeryMichael Pearson, also PATH program-they may be able to asstwith finding shelters;patient does not qualify for PATH program d/t no qualifying dx-CSW notified.

## 2017-08-09 NOTE — Progress Notes (Signed)
Inpatient Diabetes Program Recommendations  AACE/ADA: New Consensus Statement on Inpatient Glycemic Control (2015)  Target Ranges:  Prepandial:   less than 140 mg/dL      Peak postprandial:   less than 180 mg/dL (1-2 hours)      Critically ill patients:  140 - 180 mg/dL   Results for Tobin ChadGENIUS, Faust (MRN [id number]) as of 08/09/2017 11:31  Ref. Range 08/08/2017 07:36 08/08/2017 11:12 08/08/2017 16:22 08/08/2017 17:08 08/08/2017 21:06  Glucose-Capillary Latest Ref Range: 70 - 99 mg/dL 742230 (H)  7 units NOVOLOG +  35 units 70/30 Insulin  364 (H)  20 units NOVOLOG  43 (LL) 133 (H) 324 (H)  4 units NOVOLOG    Results for Tobin ChadGENIUS, Hussain (MRN [id number]) as of 08/09/2017 11:31  Ref. Range 08/09/2017 07:49 08/09/2017 11:11  Glucose-Capillary Latest Ref Range: 70 - 99 mg/dL 433362 (H)  9 units NOVOLOG +  10 units LANTUS  368 (H)   Review of Glycemic Control  Diabetes history: Type 2 DM Outpatient Diabetes medications: 70/30 Insulin 30 units BID Current orders for Inpatient glycemic control: Lantus 10 units daily       Novolog Sensitive Correction Scale/ SSI (0-9 units) Q4 hours  Inpatient Diabetes Program Recommendations:     MD- Please consider the following in-hospital insulin adjustments:  1. Increase the Lantus to 16 units daily (0.3 units/kg dosing based on weight of 54 kg)  2. Stop the Novolog 0-5 units Q4 hours  3. Continue Novolog Sensitive Correction Scale/ SSI (0-9 units) Q4 hours      --Will follow patient during hospitalization--  Ambrose FinlandJeannine Johnston Teron Blais RN, MSN, CDE Diabetes Coordinator Inpatient Glycemic Control Team Team Pager: [phone] (8a-5p)

## 2017-08-09 NOTE — Progress Notes (Signed)
PROGRESS NOTE    Mike [name]  ZOX:[id number] DOB: 09/05/1960 DOA: 08/03/2017 PCP: [name], NP    Brief Narrative:  57 year old with past medical history relevant for type 2 diabetes on insulin, hypertension, hyperlipidemia, iron deficiency anemia, homelessness admitted after being found down with hyperglycemic nonketotic state with severe hypernatremia and AKI.      Assessment & Plan:   Principal Problem:   Evaluation by psychiatric service required Active Problems:   Homelessness   Tobacco abuse   Uncontrolled type 2 diabetes mellitus with hyperosmolar nonketotic hyperglycemia (HCC)   Essential hypertension   Type 2 diabetes mellitus with hyperosmolar nonketotic hyperglycemia (HCC)   Hypernatremia   Memory difficulties  #1 hyperglycemic nonketotic state in the setting of type 2 diabetes Likely secondary to medication noncompliance.  Patient states that he does go to [facility] center in downtown [place] where his insulin is usually given to him at the site.  Patient states has not received insulin injections in a while prior to admission.  Hemoglobin A1c was 11.1.  CBG of 65 on 08/07/2017.  CBG of 43 the afternoon of 08/08/2017.  Patient 70/30 was decreased to 35 units twice daily on 08/08/2017.  Patient diabetes seems very brittle.   On admission patient was noted to have a profoundly elevated blood glucose during the hospitalization and had some subsequent low blood sugars.  Concern for brittle diabetes.  Patient noted to have had over 70 ER visits and 4 hospitalizations in the past 6 months.  Due to low CBGs on 7/23-7/24/19 and as such 70/30 discontinued.  Patient started on Lantus 10 units daily 08/09/2017.  Sliding scale insulin changed to sensitive.  Follow.   Patient was seen by the diabetic coordinator who attempted to teach patient insulin administration via vial and syringe 5 times without successful return demonstration.  It was noted that patient  needed a lot of prompting.  Continue sliding scale insulin.  Patient has been seen by psychiatry and patient lacks capacity.  Very difficult situation in terms of discharge planning.  [facility] for continued management of his diabetes.  2.  Hypernatremia Likely secondary to volume depletion.  Resolved with hydration.   3.  Acute kidney injury Secondary to prerenal azotemia.  Resolved with hydration.  4.  Hypertension Continue current regimen of Norvasc, lisinopril, Lopressor.  5.  Hyperlipidemia Continue statin.  6.  Memory issues CT head done on admission with advanced for age cerebral atrophy, periventricular white matter and corona radiata hypodensities favor chronic ischemic microvascular white matter disease.  Continue risk factor modification.  TSH of 0.341.  Free T4 of 0.98.  Vitamin B12 is 725.  RPR was nonreactive.  HIV nonreactive. Follow.  7.  Homelessness Patient has been seen by PT was recommending SNF.  Social work following.  8.  Hematochezia Likely secondary to constipation versus hemorrhoids.  Check a H&H.  Check an anemia panel.  Follow.  DVT prophylaxis: Heparin Code Status: Full Family Communication: Updated patient.  No family at bedside. Disposition Plan: Skilled nursing facility when bed available.   Consultants:   Psychiatry: Dr. Sharma Covert 08/07/2017    Procedures:   CT head without contrast 08/03/2017  2D echo 08/04/2017  Antimicrobials:   None   Subjective: Patient laying in bed.  Shakes his head to answer questions.  No chest pain.  No shortness of breath.  Per RN patient noted to have some blood around stool today.  Objective: Vitals:  08/08/17 2108 08/09/17 0613 08/09/17 0613 08/09/17 0846  BP: (!) 159/84 (!) 146/71 (!) 146/71 (!) 172/71  Pulse: 79 67 70 80  Resp: (!) 22 18 18    Temp: 97.8 F (36.6 C) 98.6 F (37 C) 98.6 F (37 C)   TempSrc: Oral Oral Oral   SpO2: 100% 100% 100%     Weight:      Height:        Intake/Output Summary (Last 24 hours) at 08/09/2017 1039 Last data filed at 08/09/2017 1035 Gross per 24 hour  Intake 960 ml  Output 2800 ml  Net -1840 ml   Filed Weights   08/03/17 1528 08/03/17 2009  Weight: 72.6 kg (160 lb) 54.3 kg (119 lb 11.4 oz)    Examination:  General exam: NAD. Respiratory system: CTAB.  No wheezes, no crackles, no rhonchi.  Normal respiratory effort.   Cardiovascular system: RRR no murmurs rubs or gallops.  No lower extremity edema.  No JVD.   Gastrointestinal system: Abdomen is soft, nontender, nondistended, positive bowel sounds.  No rebound.  No guarding. Central nervous system: Alert and oriented. No focal neurological deficits. Extremities: Symmetric 5 x 5 power. Skin: No rashes, lesions or ulcers Psychiatry: Judgement and insight appear normal. Mood & affect appropriate.     Data Reviewed: I have personally reviewed following labs and imaging studies  CBC: Recent Labs  Lab 08/03/17 1545 08/03/17 1928 08/04/17 0115 08/05/17 0605  WBC 7.1 8.4 8.3 9.7  NEUTROABS 4.8  --   --   --   HGB 11.9* 11.8* 12.0* 11.8*  HCT 38.3* 38.1* 39.1 37.8*  MCV 81.7 81.6 82.5 81.6  PLT 348 360 314 269   Basic Metabolic Panel: Recent Labs  Lab 08/05/17 0027 08/05/17 0605 08/05/17 1311 08/06/17 0502 08/06/17 1745 08/07/17 0531 08/08/17 0540  NA 151* 150*  --  141  --  143 142  K 3.2* 3.3*  --  4.4  --  4.7 4.5  CL 117* 116*  --  108  --  109 108  CO2 28 27  --  28  --  30 29  GLUCOSE 114* 58* 439* 255* 465* 302* 116*  BUN 51* 47*  --  32*  --  31* 25*  CREATININE 1.13 1.15  --  0.92  --  0.88 0.66  CALCIUM 9.0 9.0  --  8.7*  --  9.0 8.9  MG  --  2.4  --   --   --  2.3  --    GFR: Estimated Creatinine Clearance: 80.1 mL/min (by C-G formula based on SCr of 0.66 mg/dL). Liver Function Tests: Recent Labs  Lab 08/03/17 1545 08/04/17 0115  AST 13* 13*  ALT 32 27  ALKPHOS 105 94  BILITOT 0.8 0.6  PROT 7.5 6.9   ALBUMIN 3.8 3.5   No results for input(s): LIPASE, AMYLASE in the last 168 hours. Recent Labs  Lab 08/03/17 1546  AMMONIA 24   Coagulation Profile: No results for input(s): INR, PROTIME in the last 168 hours. Cardiac Enzymes: Recent Labs  Lab 08/03/17 1545 08/03/17 1928 08/04/17 0115 08/04/17 0655  CKTOTAL 55  --   --   --   TROPONINI  --  0.05* 0.05* 0.04*   BNP (last 3 results) No results for input(s): PROBNP in the last 8760 hours. HbA1C: No results for input(s): HGBA1C in the last 72 hours. CBG: Recent Labs  Lab 08/08/17 1112 08/08/17 1622 08/08/17 1708 08/08/17 2106 08/09/17 0749  GLUCAP 364* 43*  133* 324* 362*   Lipid Profile: No results for input(s): CHOL, HDL, LDLCALC, TRIG, CHOLHDL, LDLDIRECT in the last 72 hours. Thyroid Function Tests: Recent Labs    08/08/17 0540  FREET4 0.98   Anemia Panel: Recent Labs    08/08/17 0540  VITAMINB12 725   Sepsis Labs: Recent Labs  Lab 08/03/17 1552 08/03/17 1725  LATICACIDVEN 2.08* 1.49    No results found for this or any previous visit (from the past 240 hour(s)).       Radiology Studies: No results found.      Scheduled Meds: . amLODipine  10 mg Oral Daily  . aspirin  325 mg Oral Daily  . atorvastatin  40 mg Oral Daily  . feeding supplement (GLUCERNA SHAKE)  237 mL Oral TID BM  . ferrous sulfate  325 mg Oral Daily  . heparin  5,000 Units Subcutaneous Q8H  . insulin aspart  0-5 Units Subcutaneous QHS  . insulin aspart  0-9 Units Subcutaneous TID WC  . insulin glargine  10 Units Subcutaneous q morning - 10a  . lisinopril  40 mg Oral Daily  . metoprolol tartrate  12.5 mg Oral BID   Continuous Infusions:   LOS: 6 days    Time spent: 35 minutes    Ramiro Harvest, MD [facility] Pager [phone]  If 7PM-7AM, please contact night-coverage [email or web address] Password [facility] 08/09/2017, 10:39 AM

## 2017-08-10 LAB — BASIC METABOLIC PANEL
Anion gap: 7 (ref 5–15)
BUN: 25 mg/dL — AB (ref 6–20)
CO2: 29 mmol/L (ref 22–32)
CREATININE: 0.75 mg/dL (ref 0.61–1.24)
Calcium: 9 mg/dL (ref 8.9–10.3)
Chloride: 104 mmol/L (ref 98–111)
Glucose, Bld: 174 mg/dL — ABNORMAL HIGH (ref 70–99)
Potassium: 4.6 mmol/L (ref 3.5–5.1)
SODIUM: 140 mmol/L (ref 135–145)

## 2017-08-10 LAB — CBC
HCT: 29 % — ABNORMAL LOW (ref 39.0–52.0)
Hemoglobin: 9 g/dL — ABNORMAL LOW (ref 13.0–17.0)
MCH: 24.9 pg — AB (ref 26.0–34.0)
MCHC: 31 g/dL (ref 30.0–36.0)
MCV: 80.3 fL (ref 78.0–100.0)
PLATELETS: 238 10*3/uL (ref 150–400)
RBC: 3.61 MIL/uL — AB (ref 4.22–5.81)
RDW: 13.1 % (ref 11.5–15.5)
WBC: 6.1 10*3/uL (ref 4.0–10.5)

## 2017-08-10 LAB — GLUCOSE, CAPILLARY
GLUCOSE-CAPILLARY: 161 mg/dL — AB (ref 70–99)
Glucose-Capillary: 149 mg/dL — ABNORMAL HIGH (ref 70–99)
Glucose-Capillary: 207 mg/dL — ABNORMAL HIGH (ref 70–99)
Glucose-Capillary: 219 mg/dL — ABNORMAL HIGH (ref 70–99)
Glucose-Capillary: 302 mg/dL — ABNORMAL HIGH (ref 70–99)
Glucose-Capillary: 382 mg/dL — ABNORMAL HIGH (ref 70–99)

## 2017-08-10 LAB — IRON AND TIBC
IRON: 50 ug/dL (ref 45–182)
Saturation Ratios: 23 % (ref 17.9–39.5)
TIBC: 222 ug/dL — ABNORMAL LOW (ref 250–450)
UIBC: 172 ug/dL

## 2017-08-10 LAB — FERRITIN: Ferritin: 235 ng/mL (ref 24–336)

## 2017-08-10 NOTE — Progress Notes (Addendum)
Physical Therapy Treatment Patient Details Name: Mike [name] MRN: [id number] DOB: [birth year] Today's Date: 08/10/2017    History of Present Illness 57 yo male admitted with uncontrolled DM. Hx of medical noncompliance, HTN, depression, anxiety, Sz, CKD, DM. Pt is homeless.     PT Comments    Progressing with mobility. Remains unsteady. Recommend daily ambulation with nursing to increase activity/mobility.   Follow Up Recommendations  SNF     Equipment Recommendations  None recommended by PT    Recommendations for Other Services       Precautions / Restrictions Precautions Precautions: Fall Restrictions Weight Bearing Restrictions: No    Mobility  Bed Mobility               General bed mobility comments: oob in recliner  Transfers Overall transfer level: Needs assistance Equipment used: Rolling walker (2 wheeled) Transfers: Sit to/from Stand Sit to Stand: Min guard         General transfer comment: close guard for safety. VCs safety, hand placement.   Ambulation/Gait Ambulation/Gait assistance: Min assist Gait Distance (Feet): 115 Feet Assistive device: Rolling walker (2 wheeled) Gait Pattern/deviations: Step-through pattern;Narrow base of support     General Gait Details: Intermittent assist to stabilize. LOB x 1 towards L side. Cues for safety.    Stairs             Wheelchair Mobility    Modified Rankin (Stroke Patients Only)       Balance Overall balance assessment: Needs assistance         Standing balance support: Bilateral upper extremity supported Standing balance-Leahy Scale: Poor                              Cognition Arousal/Alertness: Awake/alert Behavior During Therapy: WFL for tasks assessed/performed Overall Cognitive Status: No family/caregiver present to determine baseline cognitive functioning                                        Exercises      General Comments         Pertinent Vitals/Pain Pain Assessment: No/denies pain    Home Living                      Prior Function            PT Goals (current goals can now be found in the care plan section) Progress towards PT goals: Progressing toward goals    Frequency    Min 2X/week      PT Plan Current plan remains appropriate    Co-evaluation              AM-PAC PT "6 Clicks" Daily Activity  Outcome Measure  Difficulty turning over in bed (including adjusting bedclothes, sheets and blankets)?: None Difficulty moving from lying on back to sitting on the side of the bed? : None Difficulty sitting down on and standing up from a chair with arms (e.g., wheelchair, bedside commode, etc,.)?: A Little Help needed moving to and from a bed to chair (including a wheelchair)?: A Little Help needed walking in hospital room?: A Little Help needed climbing 3-5 steps with a railing? : A Lot 6 Click Score: 19    End of Session Equipment Utilized During Treatment: Gait belt Activity Tolerance: Patient tolerated treatment well Patient left: in  chair;with call bell/phone within reach;with chair alarm set   PT Visit Diagnosis: Muscle weakness (generalized) (M62.81);Difficulty in walking, not elsewhere classified (R26.2);Unsteadiness on feet (R26.81)     Time: [id number] PT Time Calculation (min) (ACUTE ONLY): 8 min  Charges:  $Gait Training: 8-22 mins                     Rebeca AlertJannie Everette Mall, MPT Pager: [phone]

## 2017-08-10 NOTE — Progress Notes (Signed)
PROGRESS NOTE    Mike [name]  ZOX:[id number] DOB: 17-Mar-1960 DOA: 08/03/2017 PCP: [name], NP    Brief Narrative:  57 year old with past medical history relevant for type 2 diabetes on insulin, hypertension, hyperlipidemia, iron deficiency anemia, homelessness admitted after being found down with hyperglycemic nonketotic state with severe hypernatremia and AKI.      Assessment & Plan:   Principal Problem:   Evaluation by psychiatric service required Active Problems:   Homelessness   Tobacco abuse   Uncontrolled type 2 diabetes mellitus with hyperosmolar nonketotic hyperglycemia (HCC)   Essential hypertension   Anemia   Type 2 diabetes mellitus with hyperosmolar nonketotic hyperglycemia (HCC)   Hypernatremia   Memory difficulties  #1 hyperglycemic nonketotic state in the setting of type 2 diabetes Likely secondary to medication noncompliance.  Patient states that he does go to [facility] center in downtown [place] where his insulin is usually given to him at the site.  Patient states has not received insulin injections in a while prior to admission.  Hemoglobin A1c was 11.1.  CBG of 65 on 08/07/2017.  CBG of 43 the afternoon of 08/08/2017.  Patient 70/30 was decreased to 35 units twice daily on 08/08/2017.  Patient diabetes seems very brittle.  70/30 has subsequently been discontinued. On admission patient was noted to have a profoundly elevated blood glucose during the hospitalization and had some subsequent low blood sugars.  Concern for brittle diabetes.  Patient noted to have had over 70 ER visits and 4 hospitalizations in the past 6 months.  Due to low CBGs on 7/23-7/24/19 and as such 70/30 discontinued.  Patient started on Lantus 10 units daily 08/09/2017.  CBGs ranging from 149-382.  Continue sensitive sliding scale insulin. Follow.   Patient was seen by the diabetic coordinator who attempted to teach patient insulin administration via vial and syringe 5  times without successful return demonstration.  It was noted that patient needed a lot of prompting.  Continue sliding scale insulin.  Patient has been seen by psychiatry and patient lacks capacity.  Very difficult situation in terms of discharge planning.  [facility] for continued management of his diabetes.  2.  Hypernatremia Likely secondary to volume depletion.  Resolved with hydration.   3.  Acute kidney injury Secondary to prerenal azotemia.  Resolved with hydration.  4.  Hypertension Blood pressure stable.  Continue Norvasc, lisinopril, Lopressor.  5.  Hyperlipidemia Continue statin.  6.  Memory issues CT head done on admission with advanced for age cerebral atrophy, periventricular white matter and corona radiata hypodensities favor chronic ischemic microvascular white matter disease.  Continue risk factor modification.  TSH of 0.341.  Free T4 of 0.98.  Vitamin B12 is 725.  RPR was nonreactive.  HIV nonreactive. Follow.  7.  Homelessness Patient has been seen by PT was recommending SNF.  Social work following.  8.  Hematochezia/anemia Likely secondary to constipation versus hemorrhoids.  Pain was 8.7 yesterday afternoon and now at 9.0.  Hemoglobin was 12.0 on admission.  Patient denies any overt bleeding.  Anemia likely dilutional in nature.  Anemia panel consistent with anemia of chronic disease.  Follow H&H.  Transfusion threshold hemoglobin less than 8.  DVT prophylaxis: Heparin Code Status: Full Family Communication: Updated patient.  No family at bedside. Disposition Plan: Skilled nursing facility when bed available versus back to homeless shelter once strength has improved..   Consultants:   Psychiatry: Dr. Sharma Covert 08/07/2017  Procedures:   CT head without contrast 08/03/2017  2D echo 08/04/2017  Antimicrobials:   None   Subjective: Patient laying in bed.  Patient asking when he is going to eat.  Per  nursing patient ate breakfast already this morning.  No chest pain.  No shortness of breath.  Denies any rectal bleeding.  Shakes his head to answer questions.    Objective: Vitals:   08/09/17 2010 08/10/17 0353 08/10/17 1112 08/10/17 1422  BP: (!) 142/76 (!) 141/86 (!) 148/85 136/77  Pulse: 84 66 78 75  Resp: 18 16  16   Temp: 99.1 F (37.3 C) 98.4 F (36.9 C)  98.4 F (36.9 C)  TempSrc:    Oral  SpO2: 100% 100%  100%  Weight:      Height:        Intake/Output Summary (Last 24 hours) at 08/10/2017 1459 Last data filed at 08/10/2017 1435 Gross per 24 hour  Intake 2040 ml  Output 2025 ml  Net 15 ml   Filed Weights   08/03/17 1528 08/03/17 2009  Weight: 72.6 kg (160 lb) 54.3 kg (119 lb 11.4 oz)    Examination:  General exam: NAD. Respiratory system: Lungs clear to auscultation bilaterally.  No wheezes, no crackles, no rhonchi.  Normal respiratory effort. Cardiovascular system: Regular rate and rhythm no murmurs rubs or gallops.  No lower extremity edema.  No JVD.   Gastrointestinal system: Abdomen is nontender, nondistended, soft, positive bowel sounds.  No rebound.  No guarding.   Central nervous system: Alert and oriented. No focal neurological deficits. Extremities: Symmetric 5 x 5 power. Skin: No rashes, lesions or ulcers Psychiatry: Judgement and insight appear poor to fair. Mood & affect appropriate.     Data Reviewed: I have personally reviewed following labs and imaging studies  CBC: Recent Labs  Lab 08/03/17 1545 08/03/17 1928 08/04/17 0115 08/05/17 0605 08/08/17 0540 08/09/17 1351 08/10/17 0501  WBC 7.1 8.4 8.3 9.7  --   --  6.1  NEUTROABS 4.8  --   --   --   --   --   --   HGB 11.9* 11.8* 12.0* 11.8*  --  8.7* 9.0*  HCT 38.3* 38.1* 39.1 37.8* 32.9* 27.1* 29.0*  MCV 81.7 81.6 82.5 81.6  --   --  80.3  PLT 348 360 314 269  --   --  238   Basic Metabolic Panel: Recent Labs  Lab 08/05/17 0605  08/06/17 0502 08/06/17 1745 08/07/17 0531  08/08/17 0540 08/10/17 0501  NA 150*  --  141  --  143 142 140  K 3.3*  --  4.4  --  4.7 4.5 4.6  CL 116*  --  108  --  109 108 104  CO2 27  --  28  --  30 29 29   GLUCOSE 58*   < > 255* 465* 302* 116* 174*  BUN 47*  --  32*  --  31* 25* 25*  CREATININE 1.15  --  0.92  --  0.88 0.66 0.75  CALCIUM 9.0  --  8.7*  --  9.0 8.9 9.0  MG 2.4  --   --   --  2.3  --   --    < > = values in this interval not displayed.   GFR: Estimated Creatinine Clearance: 80.1 mL/min (by C-G formula based on SCr of 0.75 mg/dL). Liver Function Tests: Recent Labs  Lab 08/03/17 1545 08/04/17 0115  AST 13* 13*  ALT 32 27  ALKPHOS 105 94  BILITOT 0.8 0.6  PROT 7.5 6.9  ALBUMIN 3.8 3.5   No results for input(s): LIPASE, AMYLASE in the last 168 hours. Recent Labs  Lab 08/03/17 1546  AMMONIA 24   Coagulation Profile: No results for input(s): INR, PROTIME in the last 168 hours. Cardiac Enzymes: Recent Labs  Lab 08/03/17 1545 08/03/17 1928 08/04/17 0115 08/04/17 0655  CKTOTAL 55  --   --   --   TROPONINI  --  0.05* 0.05* 0.04*   BNP (last 3 results) No results for input(s): PROBNP in the last 8760 hours. HbA1C: No results for input(s): HGBA1C in the last 72 hours. CBG: Recent Labs  Lab 08/09/17 2006 08/10/17 0052 08/10/17 0352 08/10/17 0715 08/10/17 1102  GLUCAP 311* 219* 207* 149* 382*   Lipid Profile: No results for input(s): CHOL, HDL, LDLCALC, TRIG, CHOLHDL, LDLDIRECT in the last 72 hours. Thyroid Function Tests: Recent Labs    08/08/17 0540  FREET4 0.98   Anemia Panel: Recent Labs    08/08/17 0540 08/10/17 0501  VITAMINB12 725  --   FERRITIN  --  235  TIBC  --  222*  IRON  --  50   Sepsis Labs: Recent Labs  Lab 08/03/17 1552 08/03/17 1725  LATICACIDVEN 2.08* 1.49    No results found for this or any previous visit (from the past 240 hour(s)).       Radiology Studies: No results found.      Scheduled Meds: . amLODipine  10 mg Oral Daily  . aspirin   325 mg Oral Daily  . atorvastatin  40 mg Oral Daily  . feeding supplement (GLUCERNA SHAKE)  237 mL Oral TID BM  . ferrous sulfate  325 mg Oral Daily  . heparin  5,000 Units Subcutaneous Q8H  . hydrocortisone  25 mg Rectal BID  . insulin aspart  0-9 Units Subcutaneous Q4H  . insulin glargine  14 Units Subcutaneous q morning - 10a  . lisinopril  40 mg Oral Daily  . metoprolol tartrate  12.5 mg Oral BID  . polyethylene glycol  17 g Oral Daily  . senna-docusate  1 tablet Oral BID   Continuous Infusions:   LOS: 7 days    Time spent: 35 minutes    Ramiro Harvestaniel Martha Ellerby, MD [facility] Pager [phone]  If 7PM-7AM, please contact night-coverage [email or web address] Password [email or web address] 08/10/2017, 2:59 PM

## 2017-08-10 NOTE — Care Management Obs Status (Deleted)
MEDICARE OBSERVATION STATUS NOTIFICATION   Patient Details  Name: Mike [name] MRN: [id number] Date of Birth: [birth year]   Medicare Observation Status Notification Given:  Yes    Lanier ClamMahabir, Tarri Guilfoil, RN 08/10/2017, 3:28 PM

## 2017-08-10 NOTE — Progress Notes (Signed)
Nutrition Follow-up  DOCUMENTATION CODES:   Severe malnutrition in context of social or environmental circumstances, Severe malnutrition in context of chronic illness, Underweight  INTERVENTION:   - Due to continued erratic blood glucose levels, continue Glucerna Shake po TID rather than advancing to Ensure Enlive, each Glucerna supplement provides 220 kcal and 10 grams of protein rather than Ensure Enlive (pt prefers vanilla flavor)  - Glucerna Shake also ordered PRN  NUTRITION DIAGNOSIS:   Severe Malnutrition related to social / environmental circumstances, chronic illness(homelessness, poorly controlled IDDM) as evidenced by moderate fat depletion, severe fat depletion, moderate muscle depletion, severe muscle depletion, percent weight loss(22.5% weight loss in 7 months).  Ongoing, being addressed with oral nutrition supplementation  GOAL:   Patient will meet greater than or equal to 90% of their needs  Improving  MONITOR:   PO intake, Labs, Supplement acceptance, I & O's, Weight trends  REASON FOR ASSESSMENT:   Malnutrition Screening Tool    ASSESSMENT:   57 year old male who presented to the ED after being found under a bush (pt is homeless and had been lying there for approximately 3 days) by a bystander. Pt was found to have hyperglycemia. PMH significant for IDDM with history of medication non-compliance, hypertension, hyperlipidemia, and seizure disorder.  Spoke with pt and NT at bedside. Pt nodding "yes" and shaking head "no" to RD questions. Pt was finishing a Glucerna oral nutrition supplement at time of visit and asking for an additional supplement. RD explained need for consistent carbohydrate intake throughout the day to maximize blood glucose control. Pt agreeable and will wait for lunch.  Per pt and NT, pt is eating well. Pt is looking forward to lunch.  Noted PT is recommended SNF.  Meal Completion: 100%  Medications reviewed and include: Glucerna TID,  325 mg ferrous sulfate daily, sliding scale Novolog q 4 hours, 14 units Lantus daily, Miralax daily, Senokot-S BID  Labs reviewed: BUN 25 (H), hemoglobin 9.0 (L), HCT 29.0 (L) CBG's: 149, 207, 219, 311, 336, 368 x 24 hours   Diet Order:   Diet Order           Diet heart healthy/carb modified Room service appropriate? Yes; Fluid consistency: Thin  Diet effective now          EDUCATION NEEDS:   Not appropriate for education at this time  Skin:  Skin Assessment: Reviewed RN Assessment  Last BM:  08/09/17 medium type 2  Height:   Ht Readings from Last 1 Encounters:  08/03/17 6\' 4"  (1.93 m)    Weight:   Wt Readings from Last 1 Encounters:  08/03/17 119 lb 11.4 oz (54.3 kg)    Ideal Body Weight:  91.82 kg  BMI:  Body mass index is 14.57 kg/m.  Estimated Nutritional Needs:   Kcal:  1700-1900 kcal/day  Protein:  80-95 grams/day  Fluid:  1.7-1.9 L/day    Earma ReadingKate Jablonski Ashland Wiseman, MS, RD, LDN Pager: [phone] Weekend/After Hours: [phone]

## 2017-08-11 LAB — CBC
HCT: 27.8 % — ABNORMAL LOW (ref 39.0–52.0)
HEMOGLOBIN: 8.7 g/dL — AB (ref 13.0–17.0)
MCH: 25.1 pg — ABNORMAL LOW (ref 26.0–34.0)
MCHC: 31.3 g/dL (ref 30.0–36.0)
MCV: 80.3 fL (ref 78.0–100.0)
PLATELETS: 274 10*3/uL (ref 150–400)
RBC: 3.46 MIL/uL — AB (ref 4.22–5.81)
RDW: 13.1 % (ref 11.5–15.5)
WBC: 5.7 10*3/uL (ref 4.0–10.5)

## 2017-08-11 LAB — GLUCOSE, CAPILLARY
GLUCOSE-CAPILLARY: 382 mg/dL — AB (ref 70–99)
GLUCOSE-CAPILLARY: 444 mg/dL — AB (ref 70–99)
GLUCOSE-CAPILLARY: 255 mg/dL — AB (ref 70–99)
Glucose-Capillary: 154 mg/dL — ABNORMAL HIGH (ref 70–99)
Glucose-Capillary: 165 mg/dL — ABNORMAL HIGH (ref 70–99)
Glucose-Capillary: 193 mg/dL — ABNORMAL HIGH (ref 70–99)
Glucose-Capillary: 198 mg/dL — ABNORMAL HIGH (ref 70–99)

## 2017-08-11 LAB — BASIC METABOLIC PANEL
Anion gap: 8 (ref 5–15)
BUN: 28 mg/dL — ABNORMAL HIGH (ref 6–20)
CHLORIDE: 98 mmol/L (ref 98–111)
CO2: 29 mmol/L (ref 22–32)
CREATININE: 0.74 mg/dL (ref 0.61–1.24)
Calcium: 9 mg/dL (ref 8.9–10.3)
GFR calc Af Amer: >60 mL/min (ref >60)
GFR calc non Af Amer: >60 mL/min (ref >60)
GLUCOSE: 360 mg/dL — AB (ref 70–99)
POTASSIUM: 4.8 mmol/L (ref 3.5–5.1)
Sodium: 135 mmol/L (ref 135–145)

## 2017-08-11 MED ORDER — INSULIN ASPART 100 UNIT/ML ~~LOC~~ SOLN
11.0000 [IU] | Freq: Once | SUBCUTANEOUS | Status: AC
  Administered 2017-08-11: 11 [IU] via SUBCUTANEOUS

## 2017-08-11 MED ORDER — INSULIN GLARGINE 100 UNIT/ML ~~LOC~~ SOLN
4.0000 [IU] | Freq: Once | SUBCUTANEOUS | Status: AC
  Administered 2017-08-11: 4 [IU] via SUBCUTANEOUS
  Filled 2017-08-11: qty 0.04

## 2017-08-11 MED ORDER — INSULIN GLARGINE 100 UNIT/ML ~~LOC~~ SOLN
18.0000 [IU] | Freq: Every morning | SUBCUTANEOUS | Status: DC
  Administered 2017-08-12 – 2017-08-14 (×3): 18 [IU] via SUBCUTANEOUS
  Filled 2017-08-11 (×4): qty 0.18

## 2017-08-11 MED ORDER — ENSURE ENLIVE PO LIQD
237.0000 mL | Freq: Two times a day (BID) | ORAL | Status: DC
  Administered 2017-08-11 – 2017-08-14 (×5): 237 mL via ORAL

## 2017-08-11 NOTE — Progress Notes (Signed)
PROGRESS NOTE    Mike [name]  VWU:[id number] DOB: [birth year] DOA: 08/03/2017 PCP: [name], NP    Brief Narrative:  57 year old with past medical history relevant for type 2 diabetes on insulin, hypertension, hyperlipidemia, iron deficiency anemia, homelessness admitted after being found down with hyperglycemic nonketotic state with severe hypernatremia and AKI.      Assessment & Plan:   Principal Problem:   Evaluation by psychiatric service required Active Problems:   Homelessness   Tobacco abuse   Uncontrolled type 2 diabetes mellitus with hyperosmolar nonketotic hyperglycemia (HCC)   Essential hypertension   Anemia   Type 2 diabetes mellitus with hyperosmolar nonketotic hyperglycemia (HCC)   Hypernatremia   Memory difficulties  1 hyperglycemic nonketotic state in the setting of type 2 diabetes Likely secondary to medication noncompliance.  Patient states that he does go to [facility] center in downtown [place] where his insulin is usually given to him at the site.  Patient states has not received insulin injections in a while prior to admission.  Hemoglobin A1c was 11.1.  CBG of 65 on 08/07/2017.  CBG of 43 the afternoon of 08/08/2017.  Patient 70/30 was decreased to 35 units twice daily on 08/08/2017.  Patient diabetes seems very brittle.  70/30 has subsequently been discontinued. On admission patient was noted to have a profoundly elevated blood glucose during the hospitalization and had some subsequent low blood sugars.  Concern for brittle diabetes.  Patient noted to have had over 70 ER visits and 4 hospitalizations in the past 6 months.  Due to low CBGs on 7/23-7/24/19 and as such 70/30 discontinued.  Patient started on Lantus 10 units daily 08/09/2017.  CBGs ranging from 154-444.  Change diet to a carb modified diet.  Give NovoLog 11 units x 1.  Lantus 4 units x 1 and then change Lantus to 18 units daily starting 08/12/2017.  Follow.   Patient was seen  by the diabetic coordinator who attempted to teach patient insulin administration via vial and syringe 5 times without successful return demonstration.  It was noted that patient needed a lot of prompting.  Continue sliding scale insulin.  Patient has been seen by psychiatry and patient lacks capacity.  Very difficult situation in terms of discharge planning.  [facility] for continued management of his diabetes.  2.  Hypernatremia Likely secondary to volume depletion.  Resolved with hydration.   3.  Acute kidney injury Secondary to prerenal azotemia.  Resolved with hydration.  4.  Hypertension Stable.  Continue current regimen of Norvasc, lisinopril, Lopressor.   5.  Hyperlipidemia Continue statin.  6.  Memory issues CT head done on admission with advanced for age cerebral atrophy, periventricular white matter and corona radiata hypodensities favor chronic ischemic microvascular white matter disease.  Continue risk factor modification.  TSH of 0.341.  Free T4 of 0.98.  Vitamin B12 is 725.  RPR was nonreactive.  HIV nonreactive. Follow.  7.  Homelessness Patient has been seen by PT was recommending SNF.  Social work following.  8.  Hematochezia/anemia Likely secondary to constipation versus hemorrhoids.  Hemoglobin stable at 8.7.  Hemoglobin was 12 on admission.  No bleeding.   Anemia panel consistent with anemia of chronic disease.  Follow H&H.  Transfusion threshold hemoglobin less than 8.  DVT prophylaxis: Heparin Code Status: Full Family Communication: Updated patient.  No family at bedside. Disposition Plan: Skilled nursing facility when bed available versus back to homeless shelter  once strength has improved and able to ambulate and perform ADLs..   Consultants:   Psychiatry: Dr. Sharma CovertNorman 08/07/2017    Procedures:   CT head without contrast 08/03/2017  2D echo 08/04/2017  Antimicrobials:   None   Subjective: Patient  sitting up on the side of the bed.  Drinking Ensure.  Asking for another Ensure.  Asking when his lunch is coming.  Denies any chest pain.  No shortness of breath.    Objective: Vitals:   08/10/17 2004 08/11/17 0426 08/11/17 0432 08/11/17 0841  BP: (!) 149/71 139/71  130/69  Pulse: 67 (!) 135 73 79  Resp: 16 18    Temp: 98.4 F (36.9 C) 98.3 F (36.8 C)    TempSrc: Oral Oral    SpO2: 100% 100% 100%   Weight:      Height:        Intake/Output Summary (Last 24 hours) at 08/11/2017 1128 Last data filed at 08/10/2017 2133 Gross per 24 hour  Intake 880 ml  Output 1100 ml  Net -220 ml   Filed Weights   08/03/17 1528 08/03/17 2009  Weight: 72.6 kg (160 lb) 54.3 kg (119 lb 11.4 oz)    Examination:  General exam: NAD. Respiratory system: CTAB.  No wheezes, no crackles, no rhonchi.  Normal respiratory effort.  Cardiovascular system: RRR no murmurs rubs or gallops.  No lower extremity edema.  No JVD. Gastrointestinal system: Abdomen is soft, nontender, nondistended, positive bowel sounds.  No rebound.  No guarding.   Central nervous system: Alert and oriented. No focal neurological deficits. Extremities: Symmetric 5 x 5 power. Skin: No rashes, lesions or ulcers Psychiatry: Judgement and insight appear poor to fair. Mood & affect appropriate.     Data Reviewed: I have personally reviewed following labs and imaging studies  CBC: Recent Labs  Lab 08/05/17 0605 08/08/17 0540 08/09/17 1351 08/10/17 0501 08/11/17 0503  WBC 9.7  --   --  6.1 5.7  HGB 11.8*  --  8.7* 9.0* 8.7*  HCT 37.8* 32.9* 27.1* 29.0* 27.8*  MCV 81.6  --   --  80.3 80.3  PLT 269  --   --  238 274   Basic Metabolic Panel: Recent Labs  Lab 08/05/17 0605  08/06/17 0502 08/06/17 1745 08/07/17 0531 08/08/17 0540 08/10/17 0501 08/11/17 0503  NA 150*  --  141  --  143 142 140 135  K 3.3*  --  4.4  --  4.7 4.5 4.6 4.8  CL 116*  --  108  --  109 108 104 98  CO2 27  --  28  --  30 29 29 29   GLUCOSE 58*    < > 255* 465* 302* 116* 174* 360*  BUN 47*  --  32*  --  31* 25* 25* 28*  CREATININE 1.15  --  0.92  --  0.88 0.66 0.75 0.74  CALCIUM 9.0  --  8.7*  --  9.0 8.9 9.0 9.0  MG 2.4  --   --   --  2.3  --   --   --    < > = values in this interval not displayed.   GFR: Estimated Creatinine Clearance: 80.1 mL/min (by C-G formula based on SCr of 0.74 mg/dL). Liver Function Tests: No results for input(s): AST, ALT, ALKPHOS, BILITOT, PROT, ALBUMIN in the last 168 hours. No results for input(s): LIPASE, AMYLASE in the last 168 hours. No results for input(s): AMMONIA in the last 168 hours.  Coagulation Profile: No results for input(s): INR, PROTIME in the last 168 hours. Cardiac Enzymes: No results for input(s): CKTOTAL, CKMB, CKMBINDEX, TROPONINI in the last 168 hours. BNP (last 3 results) No results for input(s): PROBNP in the last 8760 hours. HbA1C: No results for input(s): HGBA1C in the last 72 hours. CBG: Recent Labs  Lab 08/10/17 1611 08/10/17 2002 08/11/17 0003 08/11/17 0424 08/11/17 0732  GLUCAP 302* 161* 165* 382* 154*   Lipid Profile: No results for input(s): CHOL, HDL, LDLCALC, TRIG, CHOLHDL, LDLDIRECT in the last 72 hours. Thyroid Function Tests: No results for input(s): TSH, T4TOTAL, FREET4, T3FREE, THYROIDAB in the last 72 hours. Anemia Panel: Recent Labs    08/10/17 0501  FERRITIN 235  TIBC 222*  IRON 50   Sepsis Labs: No results for input(s): PROCALCITON, LATICACIDVEN in the last 168 hours.  No results found for this or any previous visit (from the past 240 hour(s)).       Radiology Studies: No results found.      Scheduled Meds: . amLODipine  10 mg Oral Daily  . aspirin  325 mg Oral Daily  . atorvastatin  40 mg Oral Daily  . feeding supplement (ENSURE ENLIVE)  237 mL Oral BID BM  . feeding supplement (GLUCERNA SHAKE)  237 mL Oral TID BM  . ferrous sulfate  325 mg Oral Daily  . heparin  5,000 Units Subcutaneous Q8H  . hydrocortisone  25 mg Rectal  BID  . insulin aspart  0-9 Units Subcutaneous Q4H  . insulin glargine  14 Units Subcutaneous q morning - 10a  . lisinopril  40 mg Oral Daily  . metoprolol tartrate  12.5 mg Oral BID  . polyethylene glycol  17 g Oral Daily  . senna-docusate  1 tablet Oral BID   Continuous Infusions:   LOS: 8 days    Time spent: 35 minutes    Ramiro Harvest, MD [facility] Pager [phone]  If 7PM-7AM, please contact night-coverage [email or web address] Password [email or web address] 08/11/2017, 11:28 AM

## 2017-08-12 LAB — CBC
HEMATOCRIT: 27.4 % — AB (ref 39.0–52.0)
HEMOGLOBIN: 8.7 g/dL — AB (ref 13.0–17.0)
MCH: 25.6 pg — ABNORMAL LOW (ref 26.0–34.0)
MCHC: 31.8 g/dL (ref 30.0–36.0)
MCV: 80.6 fL (ref 78.0–100.0)
Platelets: 334 10*3/uL (ref 150–400)
RBC: 3.4 MIL/uL — AB (ref 4.22–5.81)
RDW: 13.2 % (ref 11.5–15.5)
WBC: 7.7 10*3/uL (ref 4.0–10.5)

## 2017-08-12 LAB — GLUCOSE, CAPILLARY
GLUCOSE-CAPILLARY: 114 mg/dL — AB (ref 70–99)
GLUCOSE-CAPILLARY: 133 mg/dL — AB (ref 70–99)
GLUCOSE-CAPILLARY: 290 mg/dL — AB (ref 70–99)
Glucose-Capillary: 123 mg/dL — ABNORMAL HIGH (ref 70–99)
Glucose-Capillary: 296 mg/dL — ABNORMAL HIGH (ref 70–99)
Glucose-Capillary: 202 mg/dL — ABNORMAL HIGH (ref 70–99)

## 2017-08-12 LAB — BASIC METABOLIC PANEL
ANION GAP: 8 (ref 5–15)
BUN: 28 mg/dL — ABNORMAL HIGH (ref 6–20)
CALCIUM: 9.7 mg/dL (ref 8.9–10.3)
CHLORIDE: 104 mmol/L (ref 98–111)
CO2: 30 mmol/L (ref 22–32)
Creatinine, Ser: 0.85 mg/dL (ref 0.61–1.24)
GFR calc non Af Amer: >60 mL/min (ref >60)
Glucose, Bld: 135 mg/dL — ABNORMAL HIGH (ref 70–99)
POTASSIUM: 4.4 mmol/L (ref 3.5–5.1)
Sodium: 142 mmol/L (ref 135–145)

## 2017-08-12 NOTE — Progress Notes (Signed)
PROGRESS NOTE    Mike [name]  ZOX:[id number] DOB: [birth year] DOA: 08/03/2017 PCP: [name], NP    Brief Narrative:  57 year old with past medical history relevant for type 2 diabetes on insulin, hypertension, hyperlipidemia, iron deficiency anemia, homelessness admitted after being found down with hyperglycemic nonketotic state with severe hypernatremia and AKI.      Assessment & Plan:   Principal Problem:   Evaluation by psychiatric service required Active Problems:   Homelessness   Tobacco abuse   Uncontrolled type 2 diabetes mellitus with hyperosmolar nonketotic hyperglycemia (HCC)   Essential hypertension   Anemia   Type 2 diabetes mellitus with hyperosmolar nonketotic hyperglycemia (HCC)   Hypernatremia   Memory difficulties  1 hyperglycemic nonketotic state in the setting of type 2 diabetes Likely secondary to medication noncompliance.  Patient states that he does go to [facility] center in downtown [place] where his insulin is usually given to him at the site.  Patient states has not received insulin injections in a while prior to admission.  Hemoglobin A1c was 11.1.  CBG of 65 on 08/07/2017.  CBG of 43 the afternoon of 08/08/2017.  Patient 70/30 was decreased to 35 units twice daily on 08/08/2017.  Patient diabetes seems very brittle.  70/30 has subsequently been discontinued. On admission patient was noted to have a profoundly elevated blood glucose during the hospitalization and had some subsequent low blood sugars.  Concern for brittle diabetes.  Patient noted to have had over 70 ER visits and 4 hospitalizations in the past 6 months.  Due to low CBGs on 7/23-7/24/19 and as such 70/30 discontinued.  Patient started on Lantus 10 units daily 08/09/2017.  CBGs ranging from 123-198.  Continue carb modified diet.  Lantus dose has been increased to 18 units daily starting 08/12/2017.  Follow.   Patient was seen by the diabetic coordinator who attempted to  teach patient insulin administration via vial and syringe 5 times without successful return demonstration.  It was noted that patient needed a lot of prompting.  Continue sliding scale insulin.  Patient has been seen by psychiatry and patient lacks capacity.  Very difficult situation in terms of discharge planning.  [facility] for continued [facility] of his diabetes.  2.  Hypernatremia Likely secondary to volume depletion.  Resolved with hydration.   3.  Acute kidney injury Secondary to prerenal azotemia.  Resolved with hydration.  4.  Hypertension Blood pressure improved on current regimen of Norvasc, lisinopril, Lopressor.    5.  Hyperlipidemia Continue statin.  6.  Memory issues CT head done on admission with advanced for age cerebral atrophy, periventricular white matter and corona radiata hypodensities favor chronic ischemic microvascular white matter disease.  Continue risk factor modification.  TSH of 0.341.  Free T4 of 0.98.  Vitamin B12 is 725.  RPR was nonreactive.  HIV nonreactive. Follow.  7.  Homelessness Patient has been seen by PT was recommending SNF.  Social work following.  8.  Hematochezia/anemia Likely secondary to constipation versus hemorrhoids.  Hemoglobin stable at 8.7.  Hemoglobin was 12 on admission.  No bleeding.   Anemia panel consistent with anemia of chronic disease.  Follow H&H.  Transfusion threshold hemoglobin less than 7.  DVT prophylaxis: Heparin Code Status: Full Family Communication: Updated patient.  No family at bedside. Disposition Plan: Skilled nursing facility when bed available versus back to homeless shelter once strength has improved and able to ambulate and perform ADLs.Marland Kitchen  Consultants:   Psychiatry: Dr. Sharma CovertNorman 08/07/2017    Procedures:   CT head without contrast 08/03/2017  2D echo 08/04/2017  Antimicrobials:   None   Subjective: Patient sitting up at the side of the bed.   Asking for some more Diet Coke to drink.  Asking when his lunch is coming stating he is hungry.  Denies any chest pain no shortness of breath by shaking his head yes or no.    Objective: Vitals:   08/11/17 1435 08/11/17 2009 08/12/17 0403 08/12/17 1035  BP: 137/70 127/62 134/65 (!) 161/71  Pulse: 69 81 78 66  Resp: 16 16 17    Temp: 98.4 F (36.9 C) 98.2 F (36.8 C) 98.3 F (36.8 C)   TempSrc: Oral Oral Oral   SpO2: 100% 100% 99%   Weight:      Height:        Intake/Output Summary (Last 24 hours) at 08/12/2017 1118 Last data filed at 08/12/2017 1037 Gross per 24 hour  Intake 3630 ml  Output 1576 ml  Net 2054 ml   Filed Weights   08/03/17 1528 08/03/17 2009  Weight: 72.6 kg (160 lb) 54.3 kg (119 lb 11.4 oz)    Examination:  General exam: NAD. Respiratory system: Lungs clear to auscultation bilaterally.  No wheezes, no crackles, no rhonchi.  Normal respiratory effort. Cardiovascular system: Regular rate and rhythm no murmurs rubs or gallops.  No lower extremity edema.  No JVD. Gastrointestinal system: Abdomen is nontender, nondistended, soft, positive bowel sounds.  No rebound.  No guarding.   Central nervous system: Alert and oriented. No focal neurological deficits. Extremities: Symmetric 5 x 5 power. Skin: No rashes, lesions or ulcers Psychiatry: Judgement and insight appear poor to fair. Mood & affect appropriate.     Data Reviewed: I have personally reviewed following labs and imaging studies  CBC: Recent Labs  Lab 08/08/17 0540 08/09/17 1351 08/10/17 0501 08/11/17 0503 08/12/17 0417  WBC  --   --  6.1 5.7 7.7  HGB  --  8.7* 9.0* 8.7* 8.7*  HCT 32.9* 27.1* 29.0* 27.8* 27.4*  MCV  --   --  80.3 80.3 80.6  PLT  --   --  238 274 334   Basic Metabolic Panel: Recent Labs  Lab 08/07/17 0531 08/08/17 0540 08/10/17 0501 08/11/17 0503 08/12/17 0417  NA 143 142 140 135 142  K 4.7 4.5 4.6 4.8 4.4  CL 109 108 104 98 104  CO2 30 29 29 29 30   GLUCOSE 302* 116*  174* 360* 135*  BUN 31* 25* 25* 28* 28*  CREATININE 0.88 0.66 0.75 0.74 0.85  CALCIUM 9.0 8.9 9.0 9.0 9.7  MG 2.3  --   --   --   --    GFR: Estimated Creatinine Clearance: 75.4 mL/min (by C-G formula based on SCr of 0.85 mg/dL). Liver Function Tests: No results for input(s): AST, ALT, ALKPHOS, BILITOT, PROT, ALBUMIN in the last 168 hours. No results for input(s): LIPASE, AMYLASE in the last 168 hours. No results for input(s): AMMONIA in the last 168 hours. Coagulation Profile: No results for input(s): INR, PROTIME in the last 168 hours. Cardiac Enzymes: No results for input(s): CKTOTAL, CKMB, CKMBINDEX, TROPONINI in the last 168 hours. BNP (last 3 results) No results for input(s): PROBNP in the last 8760 hours. HbA1C: No results for input(s): HGBA1C in the last 72 hours. CBG: Recent Labs  Lab 08/11/17 1659 08/11/17 2005 08/11/17 2354 08/12/17 0400 08/12/17 0729  GLUCAP 255* 193*  198* 123* 133*   Lipid Profile: No results for input(s): CHOL, HDL, LDLCALC, TRIG, CHOLHDL, LDLDIRECT in the last 72 hours. Thyroid Function Tests: No results for input(s): TSH, T4TOTAL, FREET4, T3FREE, THYROIDAB in the last 72 hours. Anemia Panel: Recent Labs    08/10/17 0501  FERRITIN 235  TIBC 222*  IRON 50   Sepsis Labs: No results for input(s): PROCALCITON, LATICACIDVEN in the last 168 hours.  No results found for this or any previous visit (from the past 240 hour(s)).       Radiology Studies: No results found.      Scheduled Meds: . amLODipine  10 mg Oral Daily  . aspirin  325 mg Oral Daily  . atorvastatin  40 mg Oral Daily  . feeding supplement (ENSURE ENLIVE)  237 mL Oral BID BM  . feeding supplement (GLUCERNA SHAKE)  237 mL Oral TID BM  . ferrous sulfate  325 mg Oral Daily  . heparin  5,000 Units Subcutaneous Q8H  . hydrocortisone  25 mg Rectal BID  . insulin aspart  0-9 Units Subcutaneous Q4H  . insulin glargine  18 Units Subcutaneous q morning - 10a  .  lisinopril  40 mg Oral Daily  . metoprolol tartrate  12.5 mg Oral BID  . polyethylene glycol  17 g Oral Daily  . senna-docusate  1 tablet Oral BID   Continuous Infusions:   LOS: 9 days    Time spent: 35 minutes    Ramiro Harvest, MD [facility] Pager [phone]  If 7PM-7AM, please contact night-coverage [email or web address] Password [email or web address] 08/12/2017, 11:18 AM

## 2017-08-13 LAB — GLUCOSE, CAPILLARY
GLUCOSE-CAPILLARY: 109 mg/dL — AB (ref 70–99)
GLUCOSE-CAPILLARY: 466 mg/dL — AB (ref 70–99)
Glucose-Capillary: 95 mg/dL (ref 70–99)
Glucose-Capillary: 341 mg/dL — ABNORMAL HIGH (ref 70–99)
Glucose-Capillary: 127 mg/dL — ABNORMAL HIGH (ref 70–99)

## 2017-08-13 MED ORDER — INSULIN ASPART 100 UNIT/ML ~~LOC~~ SOLN
11.0000 [IU] | Freq: Once | SUBCUTANEOUS | Status: AC
  Administered 2017-08-13: 11 [IU] via SUBCUTANEOUS

## 2017-08-13 NOTE — Progress Notes (Signed)
PROGRESS NOTE    Mike [name]  KVQ:[id number] DOB: 1960-10-24 DOA: 08/03/2017 PCP: [name], [name], NP    Brief Narrative:  57 year old with past medical history relevant for type 2 diabetes on insulin, hypertension, hyperlipidemia, iron deficiency anemia, homelessness admitted after being found down with hyperglycemic nonketotic state with severe hypernatremia and AKI.      Assessment & Plan:   Principal Problem:   Evaluation by psychiatric service required Active Problems:   Homelessness   Tobacco abuse   Uncontrolled type 2 diabetes mellitus with hyperosmolar nonketotic hyperglycemia (HCC)   Essential hypertension   Anemia   Type 2 diabetes mellitus with hyperosmolar nonketotic hyperglycemia (HCC)   Hypernatremia   Memory difficulties  1 hyperglycemic nonketotic state in the setting of type 2 diabetes Likely secondary to medication noncompliance.  Patient [facility] [place] insulin [place] usually given to him at the site.  Patient states has not received insulin injections in a while prior to admission.  Hemoglobin A1c was 11.1.  CBG of 65 on 08/07/2017.  CBG of 43 the afternoon of 08/08/2017.  Patient 70/30 was decreased to 35 units twice daily on 08/08/2017.  Patient diabetes seems very brittle.  70/30 has subsequently been discontinued. On admission patient was noted to have a profoundly elevated blood glucose during the hospitalization and had some subsequent low blood sugars.  Concern for brittle diabetes.  Patient noted to have had over 70 ER visits and 4 hospitalizations in the past 6 months.  Due to low CBGs on 7/23-7/24/19 and as such 70/30 discontinued.  Patient started on Lantus 10 units daily 08/09/2017.  CBGs ranging from 123-198.  Continue carb modified diet.  Lantus dose has been increased to 18 units daily starting 08/12/2017.  Follow.   Patient was seen by the diabetic coordinator who attempted to  teach patient insulin administration via vial and syringe 5 times without successful return demonstration.  It was noted that patient needed a lot of prompting.  Continue sliding scale insulin.  Patient has been seen by psychiatry and patient lacks capacity.  Very difficult situation in terms of discharge planning.  [facility] for continued management of his diabetes.  2.  Hypernatremia Likely secondary to volume depletion.  Resolved with hydration.   3.  Acute kidney injury Secondary to prerenal azotemia.  Resolved with hydration.  4.  Hypertension BP stable.  Continue current regimen of Norvasc, lisinopril, Lopressor.    5.  Hyperlipidemia Stable. Continue statin.  6.  Memory issues CT head done on admission with advanced for age cerebral atrophy, periventricular white matter and corona radiata hypodensities favor chronic ischemic microvascular white matter disease.  Continue risk factor modification.  TSH of 0.341.  Free T4 of 0.98.  Vitamin B12 is 725.  RPR was nonreactive.  HIV nonreactive. Follow.  7.  Homelessness Patient has been seen by PT was recommending SNF.  Social work following.  8.  Hematochezia/anemia Likely secondary to constipation versus hemorrhoids.  Hemoglobin stable at 8.7.  Hemoglobin was 12 on admission.  No bleeding.   Anemia panel consistent with anemia of chronic disease.  Follow H&H.  Transfusion threshold hemoglobin less than 7.  DVT prophylaxis: Heparin Code Status: Full Family Communication: Updated patient.  No family at bedside. Disposition Plan: Skilled nursing facility when bed available versus back to homeless shelter once strength has improved and able to ambulate and perform ADLs..Marland Kitchen  Consultants:   Psychiatry: Dr. Sharma Covert 08/07/2017    Procedures:   CT head without contrast 08/03/2017  2D echo 08/04/2017  Antimicrobials:   None   Subjective: Patient sitting up in bed.  Denies any  chest pain no shortness of breath by shaking his head.     Objective: Vitals:   08/12/17 1035 08/12/17 1451 08/12/17 2004 08/13/17 0429  BP: (!) 161/71 135/77 122/63 (!) 143/82  Pulse: 66 70 89 61  Resp:  14 14 12   Temp:  97.9 F (36.6 C) 98.6 F (37 C) 98.8 F (37.1 C)  TempSrc:  Oral Oral Oral  SpO2:  100% 97% 100%  Weight:      Height:        Intake/Output Summary (Last 24 hours) at 08/13/2017 1017 Last data filed at 08/13/2017 0431 Gross per 24 hour  Intake 960 ml  Output 1350 ml  Net -390 ml   Filed Weights   08/03/17 1528 08/03/17 2009  Weight: 72.6 kg (160 lb) 54.3 kg (119 lb 11.4 oz)    Examination:  General exam: NAD. Respiratory system: CTAB.  No wheezes, no crackles, no rhonchi.  Normal respiratory effort.  Cardiovascular system: RRR no murmurs rubs or gallops.  No lower extremity edema.  No JVD.   Gastrointestinal system: Abdomen is soft, nontender, nondistended, positive bowel sounds.  No rebound.  No guarding.  Central nervous system: Alert and oriented. No focal neurological deficits. Extremities: Symmetric 5 x 5 power. Skin: No rashes, lesions or ulcers Psychiatry: Judgement and insight appear poor to fair. Mood & affect appropriate.     Data Reviewed: I have personally reviewed following labs and imaging studies  CBC: Recent Labs  Lab 08/08/17 0540 08/09/17 1351 08/10/17 0501 08/11/17 0503 08/12/17 0417  WBC  --   --  6.1 5.7 7.7  HGB  --  8.7* 9.0* 8.7* 8.7*  HCT 32.9* 27.1* 29.0* 27.8* 27.4*  MCV  --   --  80.3 80.3 80.6  PLT  --   --  238 274 334   Basic Metabolic Panel: Recent Labs  Lab 08/07/17 0531 08/08/17 0540 08/10/17 0501 08/11/17 0503 08/12/17 0417  NA 143 142 140 135 142  K 4.7 4.5 4.6 4.8 4.4  CL 109 108 104 98 104  CO2 30 29 29 29 30   GLUCOSE 302* 116* 174* 360* 135*  BUN 31* 25* 25* 28* 28*  CREATININE 0.88 0.66 0.75 0.74 0.85  CALCIUM 9.0 8.9 9.0 9.0 9.7  MG 2.3  --   --   --   --    GFR: Estimated Creatinine  Clearance: 75.4 mL/min (by C-G formula based on SCr of 0.85 mg/dL). Liver Function Tests: No results for input(s): AST, ALT, ALKPHOS, BILITOT, PROT, ALBUMIN in the last 168 hours. No results for input(s): LIPASE, AMYLASE in the last 168 hours. No results for input(s): AMMONIA in the last 168 hours. Coagulation Profile: No results for input(s): INR, PROTIME in the last 168 hours. Cardiac Enzymes: No results for input(s): CKTOTAL, CKMB, CKMBINDEX, TROPONINI in the last 168 hours. BNP (last 3 results) No results for input(s): PROBNP in the last 8760 hours. HbA1C: No results for input(s): HGBA1C in the last 72 hours. CBG: Recent Labs  Lab 08/12/17 1616 08/12/17 2002 08/12/17 2350 08/13/17 0427 08/13/17 0741  GLUCAP 290* 202* 114* 95 109*   Lipid Profile: No results for input(s): CHOL, HDL, LDLCALC, TRIG, CHOLHDL, LDLDIRECT in the last 72 hours. Thyroid Function Tests: No results for input(s): TSH,  T4TOTAL, FREET4, T3FREE, THYROIDAB in the last 72 hours. Anemia Panel: No results for input(s): VITAMINB12, FOLATE, FERRITIN, TIBC, IRON, RETICCTPCT in the last 72 hours. Sepsis Labs: No results for input(s): PROCALCITON, LATICACIDVEN in the last 168 hours.  No results found for this or any previous visit (from the past 240 hour(s)).       Radiology Studies: No results found.      Scheduled Meds: . amLODipine  10 mg Oral Daily  . aspirin  325 mg Oral Daily  . atorvastatin  40 mg Oral Daily  . feeding supplement (ENSURE ENLIVE)  237 mL Oral BID BM  . feeding supplement (GLUCERNA SHAKE)  237 mL Oral TID BM  . ferrous sulfate  325 mg Oral Daily  . heparin  5,000 Units Subcutaneous Q8H  . hydrocortisone  25 mg Rectal BID  . insulin aspart  0-9 Units Subcutaneous Q4H  . insulin glargine  18 Units Subcutaneous q morning - 10a  . lisinopril  40 mg Oral Daily  . metoprolol tartrate  12.5 mg Oral BID  . polyethylene glycol  17 g Oral Daily  . senna-docusate  1 tablet Oral BID     Continuous Infusions:   LOS: 10 days    Time spent: 35 minutes    Ramiro Harvest, MD [facility] Pager [phone]  If 7PM-7AM, please contact night-coverage [email or web address] Password [facility] 08/13/2017, 10:17 AM

## 2017-08-13 NOTE — Progress Notes (Signed)
Physical Therapy Treatment Patient Details Name: Mike [name] MRN: [id number] DOB: 11-25-1960 Today's Date: 08/13/2017    History of Present Illness 57 yo male admitted with uncontrolled DM. Hx of medical noncompliance, HTN, depression, anxiety, Sz, CKD, DM. Pt is homeless.     PT Comments    Pt wanted to attempt walking without the RW, while doing so he had loss of balance requiring min assist to recover. Then trial of ambulation with hand held assist of 1, pt was steady with this. Pt would benefit from a cane.    Follow Up Recommendations  SNF;Supervision for mobility/OOB     Equipment Recommendations  Cane    Recommendations for Other Services       Precautions / Restrictions Precautions Precautions: Fall Restrictions Weight Bearing Restrictions: No    Mobility  Bed Mobility               General bed mobility comments: up on edge of bed  Transfers Overall transfer level: Needs assistance Equipment used: Rolling walker (2 wheeled) Transfers: Sit to/from Stand Sit to Stand: Min guard         General transfer comment: close guard for safety. VCs safety, hand placement.   Ambulation/Gait Ambulation/Gait assistance: Min assist Gait Distance (Feet): 180 Feet Assistive device: Rolling walker (2 wheeled);1 person hand held assist;None Gait Pattern/deviations: Step-through pattern;Narrow base of support Gait velocity: wfl   General Gait Details: 64' with RW, 54' without AD, 68' with hand held assist; Intermittent assist to stabilize. LOB x 1 towards L side while turning without AD or HHA. Cues for safety.  Pt steady with hand held assist   Stairs             Wheelchair Mobility    Modified Rankin (Stroke Patients Only)       Balance Overall balance assessment: Needs assistance         Standing balance support: Bilateral upper extremity supported Standing balance-Leahy Scale: Poor                              Cognition  Arousal/Alertness: Awake/alert Behavior During Therapy: WFL for tasks assessed/performed;Flat affect Overall Cognitive Status: No family/caregiver present to determine baseline cognitive functioning                                        Exercises      General Comments        Pertinent Vitals/Pain Pain Assessment: No/denies pain    Home Living                      Prior Function            PT Goals (current goals can now be found in the care plan section) Acute Rehab PT Goals Patient Stated Goal: none stated PT Goal Formulation: With patient Time For Goal Achievement: 08/20/17 Potential to Achieve Goals: Good Progress towards PT goals: Progressing toward goals    Frequency    Min 2X/week      PT Plan Current plan remains appropriate    Co-evaluation              AM-PAC PT "6 Clicks" Daily Activity  Outcome Measure  Difficulty turning over in bed (including adjusting bedclothes, sheets and blankets)?: None Difficulty moving from lying on back to sitting on the  side of the bed? : None Difficulty sitting down on and standing up from a chair with arms (e.g., wheelchair, bedside commode, etc,.)?: A Little Help needed moving to and from a bed to chair (including a wheelchair)?: A Little Help needed walking in hospital room?: A Little Help needed climbing 3-5 steps with a railing? : A Lot 6 Click Score: 19    End of Session Equipment Utilized During Treatment: Gait belt Activity Tolerance: Patient tolerated treatment well Patient left: with call bell/phone within reach;in bed;with bed alarm set Nurse Communication: Mobility status(pt requesting lunch, RN stated she'd help him order it) PT Visit Diagnosis: Muscle weakness (generalized) (M62.81);Difficulty in walking, not elsewhere classified (R26.2);Unsteadiness on feet (R26.81)     Time: [id number] PT Time Calculation (min) (ACUTE ONLY): 15 min  Charges:  $Gait Training: 8-22  mins                        Ralene BatheUhlenberg, Sherina Stammer Kistler 08/13/2017, 11:56 AM [phone]

## 2017-08-13 NOTE — Plan of Care (Signed)
Pt eating meals 100% and shakes scheduled, pt still stating he's hungry still.

## 2017-08-14 LAB — GLUCOSE, CAPILLARY
GLUCOSE-CAPILLARY: 118 mg/dL — AB (ref 70–99)
GLUCOSE-CAPILLARY: 142 mg/dL — AB (ref 70–99)
GLUCOSE-CAPILLARY: 300 mg/dL — AB (ref 70–99)
Glucose-Capillary: 110 mg/dL — ABNORMAL HIGH (ref 70–99)
Glucose-Capillary: 187 mg/dL — ABNORMAL HIGH (ref 70–99)
Glucose-Capillary: 251 mg/dL — ABNORMAL HIGH (ref 70–99)

## 2017-08-14 LAB — BASIC METABOLIC PANEL
Anion gap: 8 (ref 5–15)
BUN: 25 mg/dL — ABNORMAL HIGH (ref 6–20)
CHLORIDE: 103 mmol/L (ref 98–111)
CO2: 28 mmol/L (ref 22–32)
CREATININE: 0.75 mg/dL (ref 0.61–1.24)
Calcium: 9.1 mg/dL (ref 8.9–10.3)
GFR calc Af Amer: >60 mL/min (ref >60)
GFR calc non Af Amer: >60 mL/min (ref >60)
Glucose, Bld: 121 mg/dL — ABNORMAL HIGH (ref 70–99)
POTASSIUM: 4.4 mmol/L (ref 3.5–5.1)
Sodium: 139 mmol/L (ref 135–145)

## 2017-08-14 LAB — HEMOGLOBIN AND HEMATOCRIT, BLOOD
HEMATOCRIT: 25.5 % — AB (ref 39.0–52.0)
Hemoglobin: 8.2 g/dL — ABNORMAL LOW (ref 13.0–17.0)

## 2017-08-14 NOTE — Progress Notes (Signed)
Nutrition Follow-up  DOCUMENTATION CODES:   Severe malnutrition in context of social or environmental circumstances, Severe malnutrition in context of chronic illness, Underweight  INTERVENTION:  - Continue Glucerna Shake TID and order for Ensure Enlive BID (for when CBGs permit). - Continue to encourage PO intakes.  - Will order for new weight to be obtained today.    NUTRITION DIAGNOSIS:   Severe Malnutrition related to social / environmental circumstances, chronic illness(homelessness, poorly controlled IDDM) as evidenced by moderate fat depletion, severe fat depletion, moderate muscle depletion, severe muscle depletion, percent weight loss(22.5% weight loss in 7 months). -ongoing  GOAL:   Patient will meet greater than or equal to 90% of their needs -met   MONITOR:   PO intake, Labs, Supplement acceptance, I & O's, Weight trends  ASSESSMENT:   57 year old male who presented to the ED after being found under a bush (pt is homeless and had been lying there for approximately 3 days) by a bystander. Pt was found to have hyperglycemia. PMH significant for IDDM with history of medication non-compliance, hypertension, hyperlipidemia, and seizure disorder.  No new weight since 7/19. Patient has been eating 100% of all meals since RD assessment on 7/26 and accepting Glucerna and Ensure supplements each time they are offered to him.  Per Dr. 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    Medications reviewed; 325 mg ferrous sulfate/day, sliding scale Novolog, 18 units Lantus/day, 1 packet Miralax/day, 1 tablet  Senokot BID.  Labs reviewed; CBGs: 142, 110, 118, and 300 mg/dL today, BUN: 25 mg/dL.     Diet Order:   Diet Order           Diet Carb Modified Fluid consistency: Thin; Room service appropriate? Yes  Diet effective now          EDUCATION NEEDS:   Not appropriate for education at this time  Skin:  Skin Assessment: Reviewed RN Assessment  Last BM:  7/29  Height:   Ht Readings from Last 1 Encounters:  08/03/17 '6\' 4"'$  (1.93 m)    Weight:   Wt Readings from Last 1 Encounters:  08/03/17 119 lb 11.4 oz (54.3 kg)    Ideal Body Weight:  91.82 kg  BMI:  Body mass index is 14.57 kg/m.  Estimated Nutritional Needs:   Kcal:  1700-1900 kcal/day  Protein:  80-95 grams/day  Fluid:  1.7-1.9 L/day     Jarome Matin, MS, RD, LDN, [facility] Inpatient Clinical Dietitian Pager # [phone] After hours/weekend pager # [phone]

## 2017-08-14 NOTE — Progress Notes (Signed)
PROGRESS NOTE    Mike [name]  ZOX:[id number] DOB: 28-Jul-1960 DOA: 08/03/2017 PCP: [name], NP    Brief Narrative:  57 year old with past medical history relevant for type 2 diabetes on insulin, hypertension, hyperlipidemia, iron deficiency anemia, homelessness admitted after being found down with hyperglycemic nonketotic state with severe hypernatremia and AKI.      Assessment & Plan:   Principal Problem:   Evaluation by psychiatric service required Active Problems:   Homelessness   Tobacco abuse   Uncontrolled type 2 diabetes mellitus with hyperosmolar nonketotic hyperglycemia (HCC)   Essential hypertension   Anemia   Type 2 diabetes mellitus with hyperosmolar nonketotic hyperglycemia (HCC)   Hypernatremia   Memory difficulties  1 hyperglycemic nonketotic state in the setting of type 2 diabetes Likely secondary to medication noncompliance.  Patient states that he does go to [facility] in downtown [place] where his insulin is usually given to him at the site.  Patient states has not received insulin injections in a while prior to admission.  Hemoglobin A1c was 11.1.  CBG of 65 on 08/07/2017.  CBG of 43 the afternoon of 08/08/2017.  Patient 70/30 was decreased to 35 units twice daily on 08/08/2017.  Patient diabetes seems very brittle.  70/30 has subsequently been discontinued. On admission patient was noted to have a profoundly elevated blood glucose during the hospitalization and had some subsequent low blood sugars.  Concern for brittle diabetes.  Patient noted to have had over 70 ER visits and 4 hospitalizations in the past 6 months.  Due to low CBGs on 7/23-7/24/19 and as such 70/30 discontinued.  Patient started on Lantus 10 units daily 08/09/2017.  CBGs ranging from 123-198.  Continue carb modified diet.  Lantus dose has been increased to 18 units daily starting 08/12/2017.  Follow.   Patient was seen by the diabetic coordinator who attempted to  teach patient insulin administration via vial and syringe 5 times without successful return demonstration.  It was noted that patient needed a lot of prompting.  Continue sliding scale insulin.  Patient has been seen by psychiatry and patient lacks capacity.  Very difficult situation in terms of discharge planning.  [facility] for continued [facility] of his diabetes.  2.  Hypernatremia Likely secondary to volume depletion.  Resolved with hydration.   3.  Acute kidney injury Secondary to prerenal azotemia.  Resolved with hydration.  4.  Hypertension BP stable.  Continue current regimen of Norvasc, lisinopril, Lopressor.    5.  Hyperlipidemia Stable. Continue statin.  6.  Memory issues CT head done on admission with advanced for age cerebral atrophy, periventricular white matter and corona radiata hypodensities favor chronic ischemic microvascular white matter disease.  Continue risk factor modification.  TSH of 0.341.  Free T4 of 0.98.  Vitamin B12 is 725.  RPR was nonreactive.  HIV nonreactive. Follow.  7.  Homelessness Patient has been seen by PT was recommending SNF.  Once patient has improved with his strength and able to ambulate on his own and do his ADLs could likely go to homeless shelter versus SNF.  Social work following.  8.  Hematochezia/anemia Likely secondary to constipation versus hemorrhoids.  No further episodes.  Hemoglobin currently at 8.2.  Was 12 on admission.  No bleeding noted.  Anemia panel consistent with anemia of chronic disease.  DVT prophylaxis: Heparin Code Status: Full Family Communication: Updated patient.  No family at bedside. Disposition Plan: Skilled nursing  facility when bed available versus back to homeless shelter once strength has improved and able to ambulate and perform ADLs..   Consultants:   Psychiatry: Dr. Sharma CovertNorman 08/07/2017    Procedures:   CT head without contrast 08/03/2017  2D  echo 08/04/2017  Antimicrobials:   None   Subjective: Patient in bed.  No chest pain.  No shortness of breath.  Stating he is hungry.  Objective: Vitals:   08/13/17 0429 08/13/17 1338 08/13/17 2000 08/14/17 0421  BP: (!) 143/82 125/60 (!) 149/91 125/66  Pulse: 61 74 75 61  Resp: 12 16 18 16   Temp: 98.8 F (37.1 C) 98.7 F (37.1 C) 98.4 F (36.9 C) 98.3 F (36.8 C)  TempSrc: Oral Oral Oral Oral  SpO2: 100% 100% 100% 100%  Weight:      Height:        Intake/Output Summary (Last 24 hours) at 08/14/2017 1020 Last data filed at 08/14/2017 0600 Gross per 24 hour  Intake 837 ml  Output 400 ml  Net 437 ml   Filed Weights   08/03/17 1528 08/03/17 2009  Weight: 72.6 kg (160 lb) 54.3 kg (119 lb 11.4 oz)    Examination:  General exam: NAD. Respiratory system: Lungs clear to auscultation bilaterally.  CTAB.  No wheezes, no crackles, no rhonchi.  Normal respiratory effort.  Cardiovascular system: Regular rate rhythm no murmurs rubs or gallops.  No lower extremity edema.  No JVD. Gastrointestinal system: Abdomen is nontender, nondistended, soft, positive bowel sounds.  No rebound.  No guarding.  Central nervous system: Alert and oriented. No focal neurological deficits. Extremities: Symmetric 5 x 5 power. Skin: No rashes, lesions or ulcers Psychiatry: Judgement and insight appear poor to fair. Mood & affect appropriate.     Data Reviewed: I have personally reviewed following labs and imaging studies  CBC: Recent Labs  Lab 08/09/17 1351 08/10/17 0501 08/11/17 0503 08/12/17 0417 08/14/17 0531  WBC  --  6.1 5.7 7.7  --   HGB 8.7* 9.0* 8.7* 8.7* 8.2*  HCT 27.1* 29.0* 27.8* 27.4* 25.5*  MCV  --  80.3 80.3 80.6  --   PLT  --  238 274 334  --    Basic Metabolic Panel: Recent Labs  Lab 08/08/17 0540 08/10/17 0501 08/11/17 0503 08/12/17 0417 08/14/17 0531  NA 142 140 135 142 139  K 4.5 4.6 4.8 4.4 4.4  CL 108 104 98 104 103  CO2 29 29 29 30 28   GLUCOSE 116* 174*  360* 135* 121*  BUN 25* 25* 28* 28* 25*  CREATININE 0.66 0.75 0.74 0.85 0.75  CALCIUM 8.9 9.0 9.0 9.7 9.1   GFR: Estimated Creatinine Clearance: 80.1 mL/min (by C-G formula based on SCr of 0.75 mg/dL). Liver Function Tests: No results for input(s): AST, ALT, ALKPHOS, BILITOT, PROT, ALBUMIN in the last 168 hours. No results for input(s): LIPASE, AMYLASE in the last 168 hours. No results for input(s): AMMONIA in the last 168 hours. Coagulation Profile: No results for input(s): INR, PROTIME in the last 168 hours. Cardiac Enzymes: No results for input(s): CKTOTAL, CKMB, CKMBINDEX, TROPONINI in the last 168 hours. BNP (last 3 results) No results for input(s): PROBNP in the last 8760 hours. HbA1C: No results for input(s): HGBA1C in the last 72 hours. CBG: Recent Labs  Lab 08/13/17 1634 08/13/17 1959 08/14/17 0005 08/14/17 0417 08/14/17 0752  GLUCAP 341* 127* 142* 110* 118*   Lipid Profile: No results for input(s): CHOL, HDL, LDLCALC, TRIG, CHOLHDL, LDLDIRECT in the last  72 hours. Thyroid Function Tests: No results for input(s): TSH, T4TOTAL, FREET4, T3FREE, THYROIDAB in the last 72 hours. Anemia Panel: No results for input(s): VITAMINB12, FOLATE, FERRITIN, TIBC, IRON, RETICCTPCT in the last 72 hours. Sepsis Labs: No results for input(s): PROCALCITON, LATICACIDVEN in the last 168 hours.  No results found for this or any previous visit (from the past 240 hour(s)).       Radiology Studies: No results found.      Scheduled Meds: . amLODipine  10 mg Oral Daily  . aspirin  325 mg Oral Daily  . atorvastatin  40 mg Oral Daily  . feeding supplement (ENSURE ENLIVE)  237 mL Oral BID BM  . feeding supplement (GLUCERNA SHAKE)  237 mL Oral TID BM  . ferrous sulfate  325 mg Oral Daily  . heparin  5,000 Units Subcutaneous Q8H  . hydrocortisone  25 mg Rectal BID  . insulin aspart  0-9 Units Subcutaneous Q4H  . insulin glargine  18 Units Subcutaneous q morning - 10a  .  lisinopril  40 mg Oral Daily  . metoprolol tartrate  12.5 mg Oral BID  . polyethylene glycol  17 g Oral Daily  . senna-docusate  1 tablet Oral BID   Continuous Infusions:   LOS: 11 days    Time spent: 35 minutes    Ramiro Harvest, MD [facility] Pager [phone]  If 7PM-7AM, please contact night-coverage [email or web address] Password [email or web address] 08/14/2017, 10:20 AM

## 2017-08-14 NOTE — Progress Notes (Signed)
Physical Therapy Treatment Patient Details Name: Mike [name] MRN: [id number] DOB: Jul 10, 1960 Today's Date: 08/14/2017    History of Present Illness 57 yo male admitted with uncontrolled DM. Hx of medical noncompliance, HTN, depression, anxiety, Sz, CKD, DM. Pt is homeless.     PT Comments    Pt progressing with mobility, he tolerated increased ambulation distance of 220' with RW, and had no loss of balance today.   Follow Up Recommendations  Supervision for mobility/OOB     Equipment Recommendations  Rolling walker with 5" wheels    Recommendations for Other Services       Precautions / Restrictions Precautions Precautions: Fall Restrictions Weight Bearing Restrictions: No    Mobility  Bed Mobility               General bed mobility comments: up on edge of bed  Transfers Overall transfer level: Needs assistance Equipment used: Rolling walker (2 wheeled) Transfers: Sit to/from Stand Sit to Stand: Supervision         General transfer comment: close guard for safety. VCs safety, hand placement.   Ambulation/Gait Ambulation/Gait assistance: Min guard Gait Distance (Feet): 240 Feet Assistive device: Rolling walker (2 wheeled) Gait Pattern/deviations: Step-through pattern;Narrow base of support Gait velocity: wfl   General Gait Details: pt declined trial of SPC, prefers RW, steady with RW today, no loss of balance   Stairs             Wheelchair Mobility    Modified Rankin (Stroke Patients Only)       Balance Overall balance assessment: Needs assistance         Standing balance support: Bilateral upper extremity supported Standing balance-Leahy Scale: Fair                              Cognition Arousal/Alertness: Awake/alert Behavior During Therapy: WFL for tasks assessed/performed;Flat affect Overall Cognitive Status: No family/caregiver present to determine baseline cognitive functioning                                 General Comments: oriented, follows commands, flat affect      Exercises      General Comments        Pertinent Vitals/Pain Pain Assessment: No/denies pain    Home Living                      Prior Function            PT Goals (current goals can now be found in the care plan section) Acute Rehab PT Goals Patient Stated Goal: none stated PT Goal Formulation: With patient Time For Goal Achievement: 08/20/17 Potential to Achieve Goals: Good Progress towards PT goals: Progressing toward goals    Frequency    Min 2X/week      PT Plan Discharge plan needs to be updated    Co-evaluation              AM-PAC PT "6 Clicks" Daily Activity  Outcome Measure  Difficulty turning over in bed (including adjusting bedclothes, sheets and blankets)?: None Difficulty moving from lying on back to sitting on the side of the bed? : None Difficulty sitting down on and standing up from a chair with arms (e.g., wheelchair, bedside commode, etc,.)?: A Little Help needed moving to and from a bed to chair (including a wheelchair)?: A Little  Help needed walking in hospital room?: A Little Help needed climbing 3-5 steps with a railing? : A Lot 6 Click Score: 19    End of Session Equipment Utilized During Treatment: Gait belt Activity Tolerance: Patient tolerated treatment well Patient left: with call bell/phone within reach;in bed Nurse Communication: Mobility status(pt requesting lunch, RN stated she'd help him order it) PT Visit Diagnosis: Muscle weakness (generalized) (M62.81);Difficulty in walking, not elsewhere classified (R26.2);Unsteadiness on feet (R26.81)     Time: 8657-8469 PT Time Calculation (min) (ACUTE ONLY): 14 min  Charges:  $Gait Training: 8-22 mins                        Ralene Bathe Kistler 08/14/2017, 12:49 PM [phone]

## 2017-08-14 NOTE — Care Management Note (Signed)
Case Management Note  Patient Details  Name: Mike [name] MRN: [id number] Date of Birth: 19-Mar-1960  Subjective/Objective: Per med advisor from long length of stay meeting-goal d/c on long acting insulin-IRC,MAP resources already in place, homeless shelter-CSW to manage. Nsg-please document-patient's progress w/ADL's, & ambulation. Goal is for patient to be as independent as possible.                   Action/Plan:d/c homeless shelter   Expected Discharge Date:                  Expected Discharge Plan:  Homeless Shelter  In-House Referral:  Clinical Social Work  Discharge planning Services  CM Consult  Post Acute Care Choice:    Choice offered to:     DME Arranged:    DME Agency:     HH Arranged:    HH Agency:     Status of Service:  In process, will continue to follow  If discussed at Long Length of Stay Meetings, dates discussed:    Additional Comments:  Mike [name], Mike Adolf, RN 08/14/2017, 11:09 AM

## 2017-08-15 DIAGNOSIS — E860 Dehydration: Secondary | ICD-10-CM

## 2017-08-15 DIAGNOSIS — E109 Type 1 diabetes mellitus without complications: Secondary | ICD-10-CM

## 2017-08-15 LAB — BASIC METABOLIC PANEL
Anion gap: 7 (ref 5–15)
BUN: 24 mg/dL — ABNORMAL HIGH (ref 6–20)
CHLORIDE: 99 mmol/L (ref 98–111)
CO2: 30 mmol/L (ref 22–32)
CREATININE: 0.78 mg/dL (ref 0.61–1.24)
Calcium: 8.8 mg/dL — ABNORMAL LOW (ref 8.9–10.3)
GFR calc Af Amer: >60 mL/min (ref >60)
Glucose, Bld: 377 mg/dL — ABNORMAL HIGH (ref 70–99)
POTASSIUM: 4.8 mmol/L (ref 3.5–5.1)
SODIUM: 136 mmol/L (ref 135–145)

## 2017-08-15 LAB — GLUCOSE, CAPILLARY
GLUCOSE-CAPILLARY: 193 mg/dL — AB (ref 70–99)
Glucose-Capillary: 363 mg/dL — ABNORMAL HIGH (ref 70–99)
Glucose-Capillary: 169 mg/dL — ABNORMAL HIGH (ref 70–99)
Glucose-Capillary: 307 mg/dL — ABNORMAL HIGH (ref 70–99)
Glucose-Capillary: 205 mg/dL — ABNORMAL HIGH (ref 70–99)
Glucose-Capillary: 151 mg/dL — ABNORMAL HIGH (ref 70–99)

## 2017-08-15 LAB — HEMOGLOBIN AND HEMATOCRIT, BLOOD
HEMATOCRIT: 25.8 % — AB (ref 39.0–52.0)
Hemoglobin: 8 g/dL — ABNORMAL LOW (ref 13.0–17.0)

## 2017-08-15 MED ORDER — INSULIN ASPART 100 UNIT/ML ~~LOC~~ SOLN
6.0000 [IU] | Freq: Three times a day (TID) | SUBCUTANEOUS | Status: DC
  Administered 2017-08-15 (×2): 6 [IU] via SUBCUTANEOUS

## 2017-08-15 MED ORDER — INSULIN ASPART 100 UNIT/ML ~~LOC~~ SOLN
3.0000 [IU] | Freq: Three times a day (TID) | SUBCUTANEOUS | Status: DC
  Administered 2017-08-15: 3 [IU] via SUBCUTANEOUS

## 2017-08-15 MED ORDER — INSULIN ASPART 100 UNIT/ML ~~LOC~~ SOLN: 8.0000 [IU] | Freq: Three times a day (TID) | SUBCUTANEOUS | Status: DC

## 2017-08-15 MED ORDER — INSULIN GLARGINE 100 UNIT/ML ~~LOC~~ SOLN
20.0000 [IU] | Freq: Every morning | SUBCUTANEOUS | Status: DC
  Administered 2017-08-15: 20 [IU] via SUBCUTANEOUS
  Filled 2017-08-15 (×3): qty 0.2

## 2017-08-15 MED ORDER — INSULIN ASPART 100 UNIT/ML ~~LOC~~ SOLN
0.0000 [IU] | Freq: Three times a day (TID) | SUBCUTANEOUS | Status: DC
  Administered 2017-08-15: 2 [IU] via SUBCUTANEOUS

## 2017-08-15 NOTE — Progress Notes (Signed)
I agree with the assessment performed by the previous nurse. 

## 2017-08-15 NOTE — Progress Notes (Signed)
[facility] PROGRESS NOTE    Progress Note  Mike [name]  ZOX:[id number] DOB: 01/15/1961 DOA: 08/03/2017 PCP: [name], [name], NP     Brief Narrative:   Mike [name] is an 57 y.o. male past medical history of diabetes mellitus type 2 on insulin, essential hypertension iron deficiency anemia homeless admitted with hyperglycemia nonketotic state, with severe hypernatremia and acute kidney injury.  Assessment/Plan:   Brittle uncontrolled type 2 diabetes mellitus with hyperosmolar nonketotic hyperglycemia (HCC): Likely due to medication noncompliance. hemoglobin A1c was 11.1. Patient has had over 70 visits to the ER in the last 6 months. Started on IV insulin and IV fluid hydration and his blood glucose improved. His blood glucose continues to fluctuate talking to the nurse and the staff he does have episodes of large amounts of eating, like yesterday which he ate 5 sandwiches from noon to 8 pm  for food which could be contributing to his spiking of blood glucose. We will increase his Lantus, will schedule V units of short acting insulin with meals.  Hypernatremia: Likely due to hypovolemia now resolved.  Acute kidney injury: Likely prerenal in a azotemia resolved with IV fluid hydration.  Essential hypertension: Continue her current home regimen blood pressure is stable.  Hyperlipidemia: Continue statins.  Memory issues: CT scan of the head done on admission that showed advanced age cerebral atrophy.  TSH is 0.3 free T4 0.9, B12 was 700 RPR was nonreactive HIV was nonreactive.  Homelessness: PT evaluated the patient the recommended skilled nursing facility.  Social worker following.  Hematochezia/anemia: Likely due to hemorrhoids secondary to constipation. Bleeding in house alone instability.    Evaluation by psychiatric service required Psychiatry evaluated the patient and deemed him to lack capacity to refuse skilled nursing facility placement.  DVT  prophylaxis: lovexo Family Communication:none Disposition Plan/Barrier to D/C: snf in am Code Status:     Code Status Orders  (From admission, onward)        Start     Ordered   08/03/17 1911  Full code  Continuous     08/03/17 1910    Code Status History    Date Active Date Inactive Code Status Order ID Comments User Context   05/26/2017 2150 05/27/2017 2010 Full Code [id number]  Haydee Monicaavid, Rachal A, MD ED   03/05/2017 0003 03/07/2017 1814 Full Code [id number]  Briscoe Deutscherpyd, Timothy S, MD ED   02/07/2017 1830 02/13/2017 2135 Full Code [id number]  Narda BondsNettey, Ralph A, MD ED   11/26/2016 0309 12/22/2016 1723 Full Code [id number]  Briscoe Deutscherpyd, Timothy S, MD ED   11/18/2016 0317 11/22/2016 2208 Full Code [id number]  Hillary BowGardner, Jared M, DO ED   11/06/2016 1830 11/08/2016 1706 Full Code [id number]  Lorretta HarpNiu, Xilin, MD ED   11/05/2016 0535 11/05/2016 1638 Full Code [id number]  Dione BoozeGlick, David, MD ED   11/04/2016 0107 11/04/2016 2112 Full Code [id number]  Kathrynn RunningWouk, Noah Bedford, MD ED   10/22/2016 1407 10/27/2016 1710 Full Code [id number]  Marcos EkeWertman, Sara E, PA-C ED   08/24/2016 1945 08/25/2016 1832 Full Code [id number]  Lorretta HarpNiu, Xilin, MD ED   07/31/2016 0329 08/06/2016 1858 Full Code [id number]  Lorretta HarpNiu, Xilin, MD ED   06/24/2016 1604 06/28/2016 1833 Full Code [id number]  Gwenyth BenderBlack, Karen M, NP ED   12/23/2014 0803 12/26/2014 1756 Full Code [id number]  Stephani PoliceYork, Marianne L, PA-C ED        IV Access:    Peripheral IV   Procedures and diagnostic studies:   No results found.   Medical Consultants:  None.  Anti-Infectives:   none  Subjective:    Mike [name] no complaints.  Objective:    Vitals:   08/14/17 0421 08/14/17 1439 08/14/17 2018 08/15/17 0452  BP: 125/66  138/76 (!) 170/80  Pulse: 61  63 60  Resp: 16  16 18   Temp: 98.3 F (36.8 C)  97.7 F (36.5 C) 97.8 F (36.6 C)  TempSrc: Oral  Oral Oral  SpO2: 100%  100% 100%  Weight:  66.8 kg (147 lb 4.8 oz)    Height:        Intake/Output Summary (Last 24 hours) at 08/15/2017  0745 Last data filed at 08/14/2017 2340 Gross per 24 hour  Intake 0 ml  Output 575 ml  Net -575 ml   Filed Weights   08/03/17 1528 08/03/17 2009 08/14/17 1439  Weight: 72.6 kg (160 lb) 54.3 kg (119 lb 11.4 oz) 66.8 kg (147 lb 4.8 oz)    Exam: General exam: In no acute distress. Respiratory system: Good air movement and clear to auscultation. Cardiovascular system: S1 & S2 heard, RRR. No JVD. Gastrointestinal system: Abdomen is nondistended, soft and nontender.  Central nervous system: Alert and oriented. No focal neurological deficits. Extremities: No pedal edema. Skin: No rashes, lesions or ulcers Psychiatry: Judgement and insight appear normal. Mood & affect appropriate.    Data Reviewed:    Labs: Basic Metabolic Panel: Recent Labs  Lab 08/10/17 0501 08/11/17 0503 08/12/17 0417 08/14/17 0531 08/15/17 0508  NA 140 135 142 139 136  K 4.6 4.8 4.4 4.4 4.8  CL 104 98 104 103 99  CO2 29 29 30 28 30   GLUCOSE 174* 360* 135* 121* 377*  BUN 25* 28* 28* 25* 24*  CREATININE 0.75 0.74 0.85 0.75 0.78  CALCIUM 9.0 9.0 9.7 9.1 8.8*   GFR Estimated Creatinine Clearance: 98.6 mL/min (by C-G formula based on SCr of 0.78 mg/dL). Liver Function Tests: No results for input(s): AST, ALT, ALKPHOS, BILITOT, PROT, ALBUMIN in the last 168 hours. No results for input(s): LIPASE, AMYLASE in the last 168 hours. No results for input(s): AMMONIA in the last 168 hours. Coagulation profile No results for input(s): INR, PROTIME in the last 168 hours.  CBC: Recent Labs  Lab 08/10/17 0501 08/11/17 0503 08/12/17 0417 08/14/17 0531 08/15/17 0508  WBC 6.1 5.7 7.7  --   --   HGB 9.0* 8.7* 8.7* 8.2* 8.0*  HCT 29.0* 27.8* 27.4* 25.5* 25.8*  MCV 80.3 80.3 80.6  --   --   PLT 238 274 334  --   --    Cardiac Enzymes: No results for input(s): CKTOTAL, CKMB, CKMBINDEX, TROPONINI in the last 168 hours. BNP (last 3 results) No results for input(s): PROBNP in the last 8760 hours. CBG: Recent  Labs  Lab 08/14/17 1706 08/14/17 2015 08/15/17 0007 08/15/17 0451 08/15/17 0724  GLUCAP 187* 251* 193* 363* 169*   D-Dimer: No results for input(s): DDIMER in the last 72 hours. Hgb A1c: No results for input(s): HGBA1C in the last 72 hours. Lipid Profile: No results for input(s): CHOL, HDL, LDLCALC, TRIG, CHOLHDL, LDLDIRECT in the last 72 hours. Thyroid function studies: No results for input(s): TSH, T4TOTAL, T3FREE, THYROIDAB in the last 72 hours.  Invalid input(s): FREET3 Anemia work up: No results for input(s): VITAMINB12, FOLATE, FERRITIN, TIBC, IRON, RETICCTPCT in the last 72 hours. Sepsis Labs: Recent Labs  Lab 08/10/17 0501 08/11/17 0503 08/12/17 0417  WBC 6.1 5.7 7.7   Microbiology No results found for this or any  previous visit (from the past 240 hour(s)).   Medications:   . amLODipine  10 mg Oral Daily  . aspirin  325 mg Oral Daily  . atorvastatin  40 mg Oral Daily  . feeding supplement (ENSURE ENLIVE)  237 mL Oral BID BM  . feeding supplement (GLUCERNA SHAKE)  237 mL Oral TID BM  . ferrous sulfate  325 mg Oral Daily  . heparin  5,000 Units Subcutaneous Q8H  . hydrocortisone  25 mg Rectal BID  . insulin aspart  0-9 Units Subcutaneous Q4H  . insulin glargine  18 Units Subcutaneous q morning - 10a  . lisinopril  40 mg Oral Daily  . metoprolol tartrate  12.5 mg Oral BID  . polyethylene glycol  17 g Oral Daily  . senna-docusate  1 tablet Oral BID   Continuous Infusions:    LOS: 12 days   Marinda Elk  [facility] Pager [phone]  *Please refer to [email or web address], password [email or web address] to get updated schedule on who will round on this patient, as hospitalists switch teams weekly. If 7PM-7AM, please contact night-coverage at [email or web address], password [email or web address] for any overnight needs.  08/15/2017, 7:45 AM

## 2017-08-15 NOTE — Progress Notes (Addendum)
Physical Therapy Treatment Patient Details Name: Mike [name] MRN: [id number] DOB: [birth year] Today's Date: 08/15/2017    History of Present Illness 57 yo male admitted with uncontrolled DM. Hx of medical noncompliance, HTN, depression, anxiety, Sz, CKD, DM. Pt is homeless.     PT Comments    Overall, pt required close min guard assist for mobility on today. Continues to require supervision, for all tasks, for safety reasons. Min encouragement required for participation. Will continue to follow. Highly recommend daily ambulation in hallway with nursing supervision/assistance, in addition to PT, to increase activity. Also recommend nursing provide supervision (assist as needed) while pt performs ADLs (toileting, bathing, grooming, etc).     Follow Up Recommendations  Supervision/Assistance - 24 hour vs SNF (depending on continued progress)     Equipment Recommendations  Rolling walker with 5" wheels    Recommendations for Other Services       Precautions / Restrictions Precautions Precautions: Fall Restrictions Weight Bearing Restrictions: No    Mobility  Bed Mobility Overal bed mobility: Needs Assistance Bed Mobility: Supine to Sit;Sit to Supine     Supine to sit: Supervision Sit to supine: Supervision   General bed mobility comments: cues required to get pt to initiate task  Transfers Overall transfer level: Needs assistance Equipment used: Rolling walker (2 wheeled) Transfers: Sit to/from Stand Sit to Stand: Supervision         General transfer comment: VCs safety, hand placement.   Ambulation/Gait Ambulation/Gait assistance: Min guard Gait Distance (Feet): 200 Feet Assistive device: Rolling walker (2 wheeled) Gait Pattern/deviations: Step-through pattern;Narrow base of support     General Gait Details: pt would not attempt ambulation without walker on today. very close guard for safety.    Stairs             Wheelchair Mobility     Modified Rankin (Stroke Patients Only)       Balance                                            Cognition Arousal/Alertness: Awake/alert Behavior During Therapy: Flat affect;WFL for tasks assessed/performed Overall Cognitive Status: No family/caregiver present to determine baseline cognitive functioning                                        Exercises General Exercises - Lower Extremity Heel Raises: AROM;Both;5 reps;Standing Mini-Sqauts: AROM;Both;5 reps;Standing    General Comments        Pertinent Vitals/Pain Pain Assessment: No/denies pain    Home Living                      Prior Function            PT Goals (current goals can now be found in the care plan section) Progress towards PT goals: Progressing toward goals    Frequency    Min 3X/week      PT Plan Current plan remains appropriate;Frequency needs to be updated    Co-evaluation              AM-PAC PT "6 Clicks" Daily Activity  Outcome Measure  Difficulty turning over in bed (including adjusting bedclothes, sheets and blankets)?: None Difficulty moving from lying on back to sitting on the side of the bed? :  None Difficulty sitting down on and standing up from a chair with arms (e.g., wheelchair, bedside commode, etc,.)?: A Little Help needed moving to and from a bed to chair (including a wheelchair)?: A Little Help needed walking in hospital room?: A Little Help needed climbing 3-5 steps with a railing? : A Little 6 Click Score: 20    End of Session Equipment Utilized During Treatment: Gait belt Activity Tolerance: Patient tolerated treatment well Patient left: in bed;with call bell/phone within reach;with bed alarm set         Time: 1610-9604 PT Time Calculation (min) (ACUTE ONLY): 19 min  Charges:  $Gait Training: 8-22 mins                       Rebeca Alert, MPT Pager: [phone]

## 2017-08-15 NOTE — Care Management Note (Signed)
Case Management Note  Patient Details  Name: Mike [name] MRN: [id number] Date of Birth: 1960-03-28  Subjective/Objective: Per attending plans to d/c in am if stable glucose levels. [name]-she will be able to provide medical attention, meds-lantus sq injections, novolog sq injection with meals, resources for shelter, Med Assistance Program, transportation. Patient must d/c by 2p to arrive @ [facility] by 3p once stable for d/c. [facility] aware to bring rw to rm prior d/c. CSW following for transportation, & shelter resources.                    Action/Plan:d/c homeless shelter   Expected Discharge Date:                  Expected Discharge Plan:  Homeless Shelter  In-House Referral:  Clinical Social Work  Discharge planning Services  CM Consult  Post Acute Care Choice:    Choice offered to:  Patient  DME Arranged:  Walker rolling DME Agency:     HH Arranged:    HH Agency:     Status of Service:  Completed, signed off  If discussed at [employer] Meetings, dates discussed:    Additional Comments:  Mike [name], Mike Crays, RN 08/15/2017, 10:59 AM

## 2017-08-16 DIAGNOSIS — E1100 Type 2 diabetes mellitus with hyperosmolarity without nonketotic hyperglycemic-hyperosmolar coma (NKHHC): Principal | ICD-10-CM

## 2017-08-16 DIAGNOSIS — I10 Essential (primary) hypertension: Secondary | ICD-10-CM

## 2017-08-16 DIAGNOSIS — N179 Acute kidney failure, unspecified: Secondary | ICD-10-CM

## 2017-08-16 DIAGNOSIS — E870 Hyperosmolality and hypernatremia: Secondary | ICD-10-CM

## 2017-08-16 DIAGNOSIS — Z008 Encounter for other general examination: Secondary | ICD-10-CM

## 2017-08-16 DIAGNOSIS — E109 Type 1 diabetes mellitus without complications: Secondary | ICD-10-CM

## 2017-08-16 LAB — GLUCOSE, CAPILLARY
GLUCOSE-CAPILLARY: 394 mg/dL — AB (ref 70–99)
Glucose-Capillary: 252 mg/dL — ABNORMAL HIGH (ref 70–99)

## 2017-08-16 MED ORDER — BLOOD GLUCOSE MONITOR KIT: PACK | 0 refills | Status: DC

## 2017-08-16 MED ORDER — INSULIN ASPART 100 UNIT/ML ~~LOC~~ SOLN
8.0000 [IU] | Freq: Three times a day (TID) | SUBCUTANEOUS | Status: DC
  Administered 2017-08-16: 8 [IU] via SUBCUTANEOUS

## 2017-08-16 MED ORDER — INSULIN GLARGINE 100 UNIT/ML ~~LOC~~ SOLN
22.0000 [IU] | Freq: Every morning | SUBCUTANEOUS | Status: DC
  Administered 2017-08-16: 22 [IU] via SUBCUTANEOUS
  Filled 2017-08-16: qty 0.22

## 2017-08-16 MED ORDER — INSULIN GLARGINE 100 UNIT/ML ~~LOC~~ SOLN: 22.0000 [IU] | Freq: Every morning | SUBCUTANEOUS | 11 refills | Status: DC

## 2017-08-16 MED ORDER — INSULIN PEN NEEDLE 31G X 6 MM MISC: 1.0000 | Freq: Two times a day (BID) | 3 refills | Status: DC

## 2017-08-16 MED ORDER — INSULIN ASPART 100 UNIT/ML ~~LOC~~ SOLN: 8.0000 [IU] | Freq: Three times a day (TID) | SUBCUTANEOUS | 11 refills | Status: DC

## 2017-08-16 NOTE — Plan of Care (Signed)
Pt's Hgb was 9.0 on 7/26 and is down to 8.0 on 7/31.

## 2017-08-16 NOTE — Progress Notes (Signed)
Patient discharging to shelter.   Patient has a plan to follow up with [facility] at discharge. CSW provided patient with [facility]cher to [facility]. Patient's RN to call and set up transportation via [employer] when patient is ready to discharge. CSW signing off, no other needs identified at this time.  Celso SickleKimberly Maurice Fotheringham, [place] Clinical Social Worker [facility] Cell#: [phone]

## 2017-08-16 NOTE — Progress Notes (Signed)
Physical Therapy Treatment Patient Details Name: Mike [name] MRN: [id number] DOB: [birth year] Today's Date: 08/16/2017    History of Present Illness 57 yo male admitted with uncontrolled DM. Hx of medical noncompliance, HTN, depression, anxiety, Sz, CKD, DM. Pt is homeless.     PT Comments    Pt found in bed with feces on hands and body. While walking to bathroom, pt urinated on floor. NT assisted with hygiene-total assist required. Continue to recommend SNF vs 24 hour supervision/assist due to pt's inability to properly care for himself. Will continue to follow.     Follow Up Recommendations  SNF vs Supervision/Assistance - 24 hour     Equipment Recommendations  Rolling walker with 5" wheels    Recommendations for Other Services       Precautions / Restrictions Precautions Precautions: Fall Restrictions Weight Bearing Restrictions: No    Mobility  Bed Mobility Overal bed mobility: Modified Independent                Transfers   Equipment used: None Transfers: Sit to/from Stand Sit to Stand: Supervision         General transfer comment: for safety. VCs hand placement  Ambulation/Gait Ambulation/Gait assistance: Min guard Gait Distance (Feet): 15 Feet(x2) Assistive device: None       General Gait Details: Pt walked to and from bathroom without an assistive device. Close guard. Mildly unsteady.    Stairs             Wheelchair Mobility    Modified Rankin (Stroke Patients Only)       Balance             Standing balance-Leahy Scale: Fair                              Cognition Arousal/Alertness: Awake/alert Behavior During Therapy: Flat affect Overall Cognitive Status: No family/caregiver present to determine baseline cognitive functioning                                        Exercises      General Comments        Pertinent Vitals/Pain Pain Assessment: No/denies pain    Home Living                       Prior Function            PT Goals (current goals can now be found in the care plan section) Progress towards PT goals: Progressing toward goals    Frequency    Min 3X/week      PT Plan Current plan remains appropriate    Co-evaluation              AM-PAC PT "6 Clicks" Daily Activity  Outcome Measure  Difficulty turning over in bed (including adjusting bedclothes, sheets and blankets)?: None Difficulty moving from lying on back to sitting on the side of the bed? : None Difficulty sitting down on and standing up from a chair with arms (e.g., wheelchair, bedside commode, etc,.)?: None Help needed moving to and from a bed to chair (including a wheelchair)?: A Little Help needed walking in hospital room?: A Little Help needed climbing 3-5 steps with a railing? : A Little 6 Click Score: 21    End of Session Equipment Utilized During Treatment: Gait belt  Activity Tolerance: Patient tolerated treatment well Patient left: in chair;with call bell/phone within reach;with chair alarm set   PT Visit Diagnosis: Muscle weakness (generalized) (M62.81);Difficulty in walking, not elsewhere classified (R26.2);Unsteadiness on feet (R26.81)     Time: [id number] PT Time Calculation (min) (ACUTE ONLY): 24 min  Charges:  $Therapeutic Activity: 8-22 mins                        Rebeca AlertJannie Samaya Boardley, MPT Pager: [phone]

## 2017-08-16 NOTE — Discharge Summary (Signed)
Physician Discharge Summary  Mike [name] KXF:[id number] DOB: Jan 18, 1960 DOA: 08/03/2017  PCP: [name], NP  Admit date: 08/03/2017 Discharge date: 08/16/2017  Admitted From: home Disposition:  Home  Recommendations for Outpatient Follow-up:  1. Follow up with PCP in 1-2 weeks 2. Please obtain BMP/CBC in one week   Home Health:No Equipment/Devices:none  Discharge Condition:stable CODE STATUS:full Diet recommendation: Heart Healthy   Brief/Interim Summary: 56 y.o. 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 A1c was 11.1. 7030 was DC'd he was started on Lantus and he was titrated to 22 units daily and he will take 8 units of short acting insulin with meals.  Hypernatremia: Likely due to blood glucose now resolved.  Acute kidney injury: Likely prerenal resolved with IV fluid hydration.  Essential hypertension: Continue current regimen no changes were made.  Hyperlipidemia: Continue statins.  Memory issues: CT scan of the head done on admission showed advanced cerebral atrophy work-up was negative.  Homelessness: Slight PT evaluated recommended skilled nursing facility we will go to shelter.  Hematochezia/anemia: Likely due to  hemorrhoids in the setting of constipation this resolved.  Capacity evaluation: Psychiatry was consulted and deemed him to lack capacity to refuse skilled nursing facility placement.    Discharge Instructions  Discharge Instructions    Diet - low sodium heart healthy   Complete by:  As directed    Increase activity slowly   Complete by:  As directed      Allergies as of 08/16/2017      Reactions   Ibuprofen Nausea And Vomiting, Other (See Comments)   Makes the patient feel bloated also   Tylenol [acetaminophen] Nausea And Vomiting, Other (See Comments)   Makes the patient feel bloated also      Medication List    STOP taking these medications   aspirin 325 MG tablet   insulin aspart protamine- aspart (70-30) 100 UNIT/ML injection Commonly known as:  NOVOLOG MIX 70/30     TAKE these medications   amLODipine 10 MG tablet Commonly known as:  NORVASC Take 1 tablet (10 mg total) by mouth daily.   atorvastatin 40 MG tablet Commonly known as:  LIPITOR Take 1 tablet (40 mg total) by mouth daily.   blood glucose meter kit and supplies Kit Dispense based on patient and insurance preference. Use up to four times daily as directed. (FOR ICD-9 250.00, 250.01). What changed:  Another medication with the same name was added. Make sure you understand how and when to take each.   blood glucose meter kit and supplies Kit Dispense based on patient and insurance preference. Use up to four times daily as directed. (FOR ICD-9 250.00, 250.01). What changed:  You were already taking a medication with the same name, and this prescription was added. Make sure you understand how and when to take each.   ferrous sulfate 325 (65  FE) MG tablet Take 1 tablet (325 mg total) daily by mouth. What changed:    when to take this  reasons to take this   insulin aspart 100 UNIT/ML injection Commonly known as:  novoLOG Inject 8 Units into the skin 3 (three) times daily with meals.   insulin  glargine 100 UNIT/ML injection Commonly known as:  LANTUS Inject 0.22 mLs (22 Units total) into the skin every morning.   Insulin Pen Needle 31G X 6 MM Misc 1 Device by Does not apply route 2 (two) times daily.   lisinopril 40 MG tablet Commonly known as:  PRINIVIL,ZESTRIL Take 1 tablet (40 mg total) by mouth daily.   metoprolol tartrate 25 MG tablet Commonly known as:  LOPRESSOR Take 0.5 tablets (12.5 mg total) by mouth 2 (two) times daily.            Durable Medical Equipment  (From admission, onward)        Start     Ordered   08/15/17 1049  For home use only DME Walker rolling  Once    Question:  Patient needs a walker to treat with the following condition  Answer:  Unsteady gait   08/15/17 1049     Follow-up Information    [name], Audrea Muscat, NP Follow up.   Contact information: [place] [address] [phone]          Allergies  Allergen Reactions  . Ibuprofen Nausea And Vomiting and Other (See Comments)    Makes the patient feel bloated also  . Tylenol [Acetaminophen] Nausea And Vomiting and Other (See Comments)    Makes the patient feel bloated also    Consultations:  Psyq   Procedures/Studies: Ct Head Wo Contrast  Result Date: 08/03/2017 CLINICAL DATA:  Altered level of consciousness.  Hyperglycemia EXAM: CT HEAD WITHOUT CONTRAST TECHNIQUE: Contiguous axial images were obtained from the base of the skull through the vertex without intravenous contrast. COMPARISON:  06/16/2017 FINDINGS: Brain: The brainstem, cerebellum, cerebral peduncles, thalami, basal ganglia, and basilar cisterns appear normal. Mild prominence of the temporal horns of the lateral ventricles, stable. Cerebral atrophy. Periventricular white matter and corona radiata hypodensities favor chronic ischemic microvascular white matter disease. No intracranial hemorrhage, mass lesion, or acute CVA. Vascular: Unremarkable Skull: Unremarkable Sinuses/Orbits: Chronic right  maxillary sinusitis. Other: No supplemental non-categorized findings. IMPRESSION: 1. Advanced for age cerebral atrophy. There is continued mild prominence of the temporal horns of the lateral ventricles suggesting mild communicating hydrocephalus. 2. Periventricular white matter and corona radiata hypodensities favor chronic ischemic microvascular white matter disease. 3. Chronic right maxillary sinusitis. 4. No acute findings. Electronically Signed   By: Van Clines M.D.   On: 08/03/2017 16:12    (Echo, Carotid, EGD, Colonoscopy, ERCP)    Subjective: No complains  Discharge Exam: Vitals:   08/15/17 2048 08/16/17 0538  BP: 135/84 (!) 158/96  Pulse: 68 63  Resp: 20 18  Temp: 99 F (37.2 C) 98.9 F (37.2 C)  SpO2: 99% 98%   Vitals:   08/15/17 0923 08/15/17 1338 08/15/17 2048 08/16/17 0538  BP: (!) 165/82 (!) 148/70 135/84 (!) 158/96  Pulse: 64 77 68 63  Resp:   20 18  Temp:   99 F (37.2 C) 98.9 F (37.2 C)  TempSrc:   Oral Oral  SpO2:  100% 99% 98%  Weight:      Height:        General: Pt is alert, awake, not in acute distress Cardiovascular:  RRR, S1/S2 +, no rubs, no gallops Respiratory: CTA bilaterally, no wheezing, no rhonchi Abdominal: Soft, NT, ND, bowel sounds + Extremities: no edema, no cyanosis    The results of significant diagnostics from this hospitalization (including imaging, microbiology, ancillary and laboratory) are listed below for reference.     Microbiology: No results found for this or any previous visit (from the past 240 hour(s)).   Labs: BNP (last 3 results) No results for input(s): BNP in the last 8760 hours. Basic Metabolic Panel: Recent Labs  Lab 08/10/17 0501 08/11/17 0503 08/12/17 0417 08/14/17 0531 08/15/17 0508  NA 140 135 142 139 136  K 4.6 4.8 4.4 4.4 4.8  CL 104 98 104 103 99  CO2 '29 29 30 28 30  '$ GLUCOSE 174* 360* 135* 121* 377*  BUN 25* 28* 28* 25* 24*  CREATININE 0.75 0.74 0.85 0.75 0.78  CALCIUM 9.0 9.0 9.7  9.1 8.8*   Liver Function Tests: No results for input(s): AST, ALT, ALKPHOS, BILITOT, PROT, ALBUMIN in the last 168 hours. No results for input(s): LIPASE, AMYLASE in the last 168 hours. No results for input(s): AMMONIA in the last 168 hours. CBC: Recent Labs  Lab 08/10/17 0501 08/11/17 0503 08/12/17 0417 08/14/17 0531 08/15/17 0508  WBC 6.1 5.7 7.7  --   --   HGB 9.0* 8.7* 8.7* 8.2* 8.0*  HCT 29.0* 27.8* 27.4* 25.5* 25.8*  MCV 80.3 80.3 80.6  --   --   PLT 238 274 334  --   --    Cardiac Enzymes: No results for input(s): CKTOTAL, CKMB, CKMBINDEX, TROPONINI in the last 168 hours. BNP: Invalid input(s): POCBNP CBG: Recent Labs  Lab 08/15/17 0724 08/15/17 1147 08/15/17 1645 08/15/17 2054 08/16/17 0728  GLUCAP 169* 307* 205* 151* 252*   D-Dimer No results for input(s): DDIMER in the last 72 hours. Hgb A1c No results for input(s): HGBA1C in the last 72 hours. Lipid Profile No results for input(s): CHOL, HDL, LDLCALC, TRIG, CHOLHDL, LDLDIRECT in the last 72 hours. Thyroid function studies No results for input(s): TSH, T4TOTAL, T3FREE, THYROIDAB in the last 72 hours.  Invalid input(s): FREET3 Anemia work up No results for input(s): VITAMINB12, FOLATE, FERRITIN, TIBC, IRON, RETICCTPCT in the last 72 hours. Urinalysis    Component Value Date/Time   COLORURINE YELLOW 08/03/2017 1900   APPEARANCEUR CLEAR 08/03/2017 1900   LABSPEC 1.026 08/03/2017 1900   PHURINE 5.0 08/03/2017 1900   GLUCOSEU >=500 (A) 08/03/2017 1900   HGBUR SMALL (A) 08/03/2017 1900   BILIRUBINUR NEGATIVE 08/03/2017 1900   KETONESUR 5 (A) 08/03/2017 1900   PROTEINUR NEGATIVE 08/03/2017 1900   UROBILINOGEN 1.0 02/07/2011 1605   NITRITE NEGATIVE 08/03/2017 1900   LEUKOCYTESUR NEGATIVE 08/03/2017 1900   Sepsis Labs Invalid input(s): PROCALCITONIN,  WBC,  LACTICIDVEN Microbiology No results found for this or any previous visit (from the past 240 hour(s)).   Time coordinating discharge: 35  minutes  SIGNED:   Charlynne Cousins, MD  [facility] 08/16/2017, 10:30 AM Pager   If 7PM-7AM, please contact night-coverage [email or web address] Password [email or web address]

## 2017-08-31 ENCOUNTER — Other Ambulatory Visit: Payer: Self-pay

## 2017-08-31 ENCOUNTER — Encounter (HOSPITAL_COMMUNITY): Payer: Self-pay | Admitting: Emergency Medicine

## 2017-08-31 ENCOUNTER — Emergency Department (HOSPITAL_COMMUNITY)
Admission: EM | Admit: 2017-08-31 | Discharge: 2017-08-31 | Disposition: A | Discharge status: Home / Self Care | Payer: Self-pay | Attending: Emergency Medicine | Admitting: Emergency Medicine

## 2017-08-31 DIAGNOSIS — Z79899 Other long term (current) drug therapy: Secondary | ICD-10-CM | POA: Insufficient documentation

## 2017-08-31 DIAGNOSIS — R739 Hyperglycemia, unspecified: Secondary | ICD-10-CM

## 2017-08-31 DIAGNOSIS — F17210 Nicotine dependence, cigarettes, uncomplicated: Secondary | ICD-10-CM | POA: Insufficient documentation

## 2017-08-31 DIAGNOSIS — E1165 Type 2 diabetes mellitus with hyperglycemia: Secondary | ICD-10-CM | POA: Insufficient documentation

## 2017-08-31 DIAGNOSIS — I10 Essential (primary) hypertension: Secondary | ICD-10-CM | POA: Insufficient documentation

## 2017-08-31 DIAGNOSIS — Z794 Long term (current) use of insulin: Secondary | ICD-10-CM | POA: Insufficient documentation

## 2017-08-31 LAB — I-STAT CHEM 8, ED
BUN: 20 mg/dL (ref 6–20)
CALCIUM ION: 1.16 mmol/L (ref 1.15–1.40)
Chloride: 95 mmol/L — ABNORMAL LOW (ref 98–111)
Creatinine, Ser: 0.8 mg/dL (ref 0.61–1.24)
Glucose, Bld: 429 mg/dL — ABNORMAL HIGH (ref 70–99)
HCT: 28 % — ABNORMAL LOW (ref 39.0–52.0)
Hemoglobin: 9.5 g/dL — ABNORMAL LOW (ref 13.0–17.0)
Potassium: 4.2 mmol/L (ref 3.5–5.1)
SODIUM: 135 mmol/L (ref 135–145)
TCO2: 30 mmol/L (ref 22–32)

## 2017-08-31 LAB — CBG MONITORING, ED: GLUCOSE-CAPILLARY: 387 mg/dL — AB (ref 70–99)

## 2017-08-31 MED ORDER — INSULIN ASPART 100 UNIT/ML ~~LOC~~ SOLN
10.0000 [IU] | Freq: Once | SUBCUTANEOUS | Status: AC
  Administered 2017-08-31: 10 [IU] via SUBCUTANEOUS
  Filled 2017-08-31: qty 1

## 2017-08-31 MED ORDER — INSULIN ASPART PROT & ASPART (70-30 MIX) 100 UNIT/ML ~~LOC~~ SUSP: 10.0000 [IU] | Freq: Once | SUBCUTANEOUS | Status: DC

## 2017-08-31 NOTE — ED Notes (Signed)
Pt given sandwich, bottle water and bus pass with discharge instructions.

## 2017-08-31 NOTE — ED Triage Notes (Signed)
Per [facility] [place] [facility] for hyperglycemia. CBG 439.

## 2017-08-31 NOTE — ED Notes (Signed)
Bed: [facility]PT4 Expected date:  Expected time:  Means of arrival:  Comments: 

## 2017-08-31 NOTE — ED Provider Notes (Signed)
[facility] DEPT Provider Note   CSN: [id number] Arrival date & time: 08/31/17  1141     History   Chief Complaint Chief Complaint  Patient presents with  . Hyperglycemia    HPI Mike [name] is a 57 y.o. male.  HPI  57 year old with history of diabetes comes in with chief complaint of elevated blood sugar. Patient was picked up at EMS, bystanders called for help.  Patient denies any chest pain, shortness of breath, nausea, vomiting, fevers.  His only complaint is that he is hungry.  Past Medical History:  Diagnosis Date  . Anxiety   . Anxiety   . Chronic lower back pain   . Depression   . DKA (diabetic ketoacidoses) ([place]) 07/30/2016  . Hyperlipemia   . Hypertension   . Migraine    "last one was ~ 4 yr ago" (12/23/2014)  . Seizures ([place])    "related to pills for anxiety; if I don't take the pills I'm suppose to take I'll have them" (12/23/2014)  . Type II diabetes mellitus [facility])     Patient Active Problem List   Diagnosis Date Noted  . 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 Hypernatremia   . Memory difficulties   . Type 2 diabetes mellitus with hyperosmolar nonketotic hyperglycemia ([place]) 08/03/2017  . Hyperglycemia 05/26/2017  . Noncompliance 05/26/2017  . AKI (acute kidney injury) ([place]) 03/04/2017  . Malnutrition of moderate degree 02/08/2017  . DKA (diabetic ketoacidoses) ([place]) 02/07/2017  . History of stroke 02/07/2017  . Hyperphosphatemia 12/13/2016  . Evaluation by psychiatric service required 11/30/2016  . Lactic acidosis 11/18/2016  . Anemia 11/06/2016  . HLD (hyperlipidemia) 11/06/2016  . Adjustment disorder with other symptoms 11/05/2016  . Uncontrolled type 2 diabetes mellitus with hyperosmolar nonketotic hyperglycemia ([place]) 08/24/2016  . Essential hypertension 08/24/2016  . Acute ischemic stroke ([place])   . Protein-calorie malnutrition, severe 12/24/2014  . Homelessness 12/23/2014  . Hypertensive urgency  12/23/2014  . DM (diabetes mellitus) ([place]) 12/23/2014  . Intermittent palpitations 12/23/2014  . Tobacco abuse 12/23/2014  . Pleuritic chest pain 12/23/2014  . Abnormal EKG 12/23/2014  . Type II diabetes mellitus with renal manifestations [facility])     Past Surgical History:  Procedure Laterality Date  . NO PAST SURGERIES          Home Medications    Prior to Admission medications   Medication Sig Start Date End Date Taking? Authorizing Provider  amLODipine (NORVASC) 10 MG tablet Take 1 tablet (10 mg total) by mouth daily. 05/28/17   Dessa Phi, DO  atorvastatin (LIPITOR) 40 MG tablet Take 1 tablet (40 mg total) by mouth daily. 01/26/17   Cardama, Grayce Sessions, MD  blood glucose meter kit and supplies KIT Dispense based on patient and insurance preference. Use up to four times daily as directed. (FOR ICD-9 250.00, 250.01). 11/22/16   Velvet Bathe, MD  blood glucose meter kit and supplies KIT Dispense based on patient and insurance preference. Use up to four times daily as directed. (FOR ICD-9 250.00, 250.01). 08/16/17   Charlynne Cousins, MD  ferrous sulfate 325 (65 FE) MG tablet Take 1 tablet (325 mg total) daily by mouth. Patient taking differently: Take 325 mg by mouth daily as needed (low iron).  11/23/16   Ward, Delice Bison, DO  insulin aspart (NOVOLOG) 100 UNIT/ML injection Inject 8 Units into the skin 3 (three) times daily with meals. 08/16/17   Charlynne Cousins, MD  insulin glargine (LANTUS) 100 UNIT/ML injection Inject  0.22 mLs (22 Units total) into the skin every morning. 08/16/17   Charlynne Cousins, MD  Insulin Pen Needle 31G X 6 MM MISC 1 Device by Does not apply route 2 (two) times daily. 08/16/17   Charlynne Cousins, MD  lisinopril (PRINIVIL,ZESTRIL) 40 MG tablet Take 1 tablet (40 mg total) by mouth daily. 04/30/17   Dorie Rank, MD  metoprolol tartrate (LOPRESSOR) 25 MG tablet Take 0.5 tablets (12.5 mg total) by mouth 2 (two) times daily. 03/07/17   Georgette Shell,  MD    Family History Family History  Problem Relation Age of Onset  . Diabetes Mellitus II Mother   . Diabetes Mellitus II Father     Social History Social History   Tobacco Use  . Smoking status: Current Every Day Smoker    Packs/day: 0.10    Years: 40.00    Pack years: 4.00    Types: Cigarettes  . Smokeless tobacco: Never Used  Substance Use Topics  . Alcohol use: Yes    Alcohol/week: 2.0 standard drinks    Types: 2 Cans of beer per week    Comment: No labs available at time of assessment  . Drug use: No    Comment: 12/23/2014 "stopped ~ 10 yrs ago"     Allergies   Ibuprofen and Tylenol [acetaminophen]   Review of Systems Review of Systems  Respiratory: Negative for chest tightness.   Cardiovascular: Negative for chest pain.  Gastrointestinal: Negative for nausea and vomiting.  Neurological: Negative for headaches.     Physical Exam Updated Vital Signs BP (!) 147/72 (BP Location: Left Arm)   Pulse 82   Temp 99.1 F (37.3 C) (Oral)   Resp 17   SpO2 99%   Physical Exam  Constitutional: He is oriented to person, place, and time. He appears well-developed.  HENT:  Head: Atraumatic.  Neck: Neck supple.  Cardiovascular: Normal rate.  Pulmonary/Chest: Effort normal.  Neurological: He is alert and oriented to person, place, and time.  Skin: Skin is warm.  Nursing note and vitals reviewed.    ED Treatments / Results  Labs (all labs ordered are listed, but only abnormal results are displayed) Labs Reviewed  CBG MONITORING, ED - Abnormal; Notable for the following components:      Result Value   Glucose-Capillary 387 (*)    All other components within normal limits  I-STAT CHEM 8, ED - Abnormal; Notable for the following components:   Chloride 95 (*)    Glucose, Bld 429 (*)    Hemoglobin 9.5 (*)    HCT 28.0 (*)    All other components within normal limits    EKG None  Radiology No results found.  Procedures Procedures (including critical  care time)  Medications Ordered in ED Medications  insulin aspart protamine- aspart (NOVOLOG MIX 70/30) injection 10 Units (has no administration in time range)     Initial Impression / Assessment and Plan / ED Course  I have reviewed the triage vital signs and the nursing notes.  Pertinent labs & imaging results that were available during my care of the patient were reviewed by me and considered in my medical decision making (see chart for details).     57 year old male well-known to our emergency department comes in via EMS.  Patient has poorly controlled diabetes because of his medication noncompliance.  His blood sugar is again noted to be elevated, but there is no DKA.  Oral challenge was initiated and patient has passed it.  We will discharge him. There does not appear to be any acute medical condition going on with this visit.  Final Clinical Impressions(s) / ED Diagnoses   Final diagnoses:  Hyperglycemia    ED Discharge Orders    None       Varney Biles, MD 08/31/17 1300

## 2017-08-31 NOTE — ED Notes (Signed)
Pt given cup ice water 

## 2017-08-31 NOTE — ED Notes (Signed)
ED Provider at bedside. 

## 2017-09-12 ENCOUNTER — Encounter (HOSPITAL_COMMUNITY): Payer: Self-pay

## 2017-09-12 ENCOUNTER — Other Ambulatory Visit: Payer: Self-pay

## 2017-09-12 ENCOUNTER — Emergency Department (HOSPITAL_COMMUNITY)
Admission: EM | Admit: 2017-09-12 | Discharge: 2017-09-13 | Disposition: A | Discharge status: Home / Self Care | Payer: Self-pay | Attending: Emergency Medicine | Admitting: Emergency Medicine

## 2017-09-12 DIAGNOSIS — Z8673 Personal history of transient ischemic attack (TIA), and cerebral infarction without residual deficits: Secondary | ICD-10-CM | POA: Insufficient documentation

## 2017-09-12 DIAGNOSIS — I10 Essential (primary) hypertension: Secondary | ICD-10-CM | POA: Insufficient documentation

## 2017-09-12 DIAGNOSIS — E860 Dehydration: Secondary | ICD-10-CM | POA: Insufficient documentation

## 2017-09-12 DIAGNOSIS — E1165 Type 2 diabetes mellitus with hyperglycemia: Secondary | ICD-10-CM | POA: Insufficient documentation

## 2017-09-12 DIAGNOSIS — Z79899 Other long term (current) drug therapy: Secondary | ICD-10-CM | POA: Insufficient documentation

## 2017-09-12 DIAGNOSIS — E785 Hyperlipidemia, unspecified: Secondary | ICD-10-CM | POA: Insufficient documentation

## 2017-09-12 DIAGNOSIS — F17210 Nicotine dependence, cigarettes, uncomplicated: Secondary | ICD-10-CM | POA: Insufficient documentation

## 2017-09-12 DIAGNOSIS — Z794 Long term (current) use of insulin: Secondary | ICD-10-CM | POA: Insufficient documentation

## 2017-09-12 DIAGNOSIS — R739 Hyperglycemia, unspecified: Secondary | ICD-10-CM

## 2017-09-12 LAB — BASIC METABOLIC PANEL
Anion gap: 23 — ABNORMAL HIGH (ref 5–15)
BUN: 45 mg/dL — ABNORMAL HIGH (ref 6–20)
CALCIUM: 9.7 mg/dL (ref 8.9–10.3)
CO2: 19 mmol/L — AB (ref 22–32)
Chloride: 95 mmol/L — ABNORMAL LOW (ref 98–111)
Creatinine, Ser: 1.84 mg/dL — ABNORMAL HIGH (ref 0.61–1.24)
GFR calc Af Amer: 46 mL/min — ABNORMAL LOW (ref >60)
GFR calc non Af Amer: 40 mL/min — ABNORMAL LOW (ref >60)
GLUCOSE: 750 mg/dL — AB (ref 70–99)
Potassium: 4.8 mmol/L (ref 3.5–5.1)
Sodium: 137 mmol/L (ref 135–145)

## 2017-09-12 LAB — I-STAT CHEM 8, ED
BUN: 42 mg/dL — ABNORMAL HIGH (ref 6–20)
CHLORIDE: 97 mmol/L — AB (ref 98–111)
CREATININE: 1.6 mg/dL — AB (ref 0.61–1.24)
Calcium, Ion: 1.23 mmol/L (ref 1.15–1.40)
HCT: 36 % — ABNORMAL LOW (ref 39.0–52.0)
Hemoglobin: 12.2 g/dL — ABNORMAL LOW (ref 13.0–17.0)
Potassium: 4.7 mmol/L (ref 3.5–5.1)
SODIUM: 134 mmol/L — AB (ref 135–145)
TCO2: 22 mmol/L (ref 22–32)

## 2017-09-12 LAB — CBC
HCT: 33.6 % — ABNORMAL LOW (ref 39.0–52.0)
HEMOGLOBIN: 10.7 g/dL — AB (ref 13.0–17.0)
MCH: 25.1 pg — AB (ref 26.0–34.0)
MCHC: 31.8 g/dL (ref 30.0–36.0)
MCV: 78.9 fL (ref 78.0–100.0)
Platelets: 332 10*3/uL (ref 150–400)
RBC: 4.26 MIL/uL (ref 4.22–5.81)
RDW: 14.7 % (ref 11.5–15.5)
WBC: 5.5 10*3/uL (ref 4.0–10.5)

## 2017-09-12 LAB — CBG MONITORING, ED: Glucose-Capillary: >600 mg/dL (ref 70–99)

## 2017-09-12 MED ORDER — SODIUM CHLORIDE 0.9 % IV BOLUS
3000.0000 mL | Freq: Once | INTRAVENOUS | Status: AC
  Administered 2017-09-13: 3000 mL via INTRAVENOUS

## 2017-09-12 MED ORDER — INSULIN ASPART 100 UNIT/ML ~~LOC~~ SOLN
12.0000 [IU] | Freq: Once | SUBCUTANEOUS | Status: AC
  Administered 2017-09-13: 12 [IU] via SUBCUTANEOUS
  Filled 2017-09-12: qty 1

## 2017-09-12 MED ORDER — INSULIN GLARGINE 100 UNIT/ML ~~LOC~~ SOLN
22.0000 [IU] | Freq: Once | SUBCUTANEOUS | Status: AC
  Administered 2017-09-13: 22 [IU] via SUBCUTANEOUS
  Filled 2017-09-12: qty 0.22

## 2017-09-12 NOTE — ED Notes (Signed)
Patient is aware a urine sample is needed and has a urinal with him.

## 2017-09-12 NOTE — ED Notes (Signed)
TWO UNSUCCESSFUL LAB COLLECTION ATTEMPTS 

## 2017-09-12 NOTE — ED Provider Notes (Addendum)
[facility] DEPT Provider Note   CSN: [id number] Arrival date & time: 09/12/17  1957     History   Chief Complaint Chief Complaint  Patient presents with  . Hyperglycemia    HPI Mike [name] is a 57 y.o. male.  HPI   He presents for evaluation of hyperglycemia.  Patient contacted EMS who checked his sugar and found it to be high.  He was therefore transferred here.  He states his last insulin was taken early this morning.  Patient has numerous visits to the ED with similar complaints.  The last ED visit was about 12 days ago.  He was admitted to the hospital, for 12 days, 5 weeks ago with AKI.  He denies vomiting, fever, chest pain or dizziness.  There are no other known modifying factors.  Past Medical History:  Diagnosis Date  . Anxiety   . Anxiety   . Chronic lower back pain   . Depression   . DKA (diabetic ketoacidoses) ([place]) 07/30/2016  . Hyperlipemia   . Hypertension   . Migraine    "last one was ~ 4 yr ago" (12/23/2014)  . Seizures ([place])    "related to pills for anxiety; if I don't take the pills I'm suppose to take I'll have them" (12/23/2014)  . Type II diabetes mellitus [facility])     Patient Active Problem List   Diagnosis Date Noted  . 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 Hypernatremia   . Memory difficulties   . Type 2 diabetes mellitus with hyperosmolar nonketotic hyperglycemia ([place]) 08/03/2017  . Hyperglycemia 05/26/2017  . Noncompliance 05/26/2017  . AKI (acute kidney injury) ([place]) 03/04/2017  . Malnutrition of moderate degree 02/08/2017  . DKA (diabetic ketoacidoses) ([place]) 02/07/2017  . History of stroke 02/07/2017  . Hyperphosphatemia 12/13/2016  . Evaluation by psychiatric service required 11/30/2016  . Lactic acidosis 11/18/2016  . Anemia 11/06/2016  . HLD (hyperlipidemia) 11/06/2016  . Adjustment disorder with other symptoms 11/05/2016  . Uncontrolled type 2 diabetes mellitus with hyperosmolar nonketotic  hyperglycemia ([place]) 08/24/2016  . Essential hypertension 08/24/2016  . Acute ischemic stroke ([place])   . Protein-calorie malnutrition, severe 12/24/2014  . Homelessness 12/23/2014  . Hypertensive urgency 12/23/2014  . DM (diabetes mellitus) ([place]) 12/23/2014  . Intermittent palpitations 12/23/2014  . Tobacco abuse 12/23/2014  . Pleuritic chest pain 12/23/2014  . Abnormal EKG 12/23/2014  . Type II diabetes mellitus with renal manifestations [facility])     Past Surgical History:  Procedure Laterality Date  . NO PAST SURGERIES          Home Medications    Prior to Admission medications   Medication Sig Start Date End Date Taking? Authorizing Provider  amLODipine (NORVASC) 10 MG tablet Take 1 tablet (10 mg total) by mouth daily. 05/28/17   Dessa Phi, DO  atorvastatin (LIPITOR) 40 MG tablet Take 1 tablet (40 mg total) by mouth daily. 01/26/17   Cardama, Grayce Sessions, MD  blood glucose meter kit and supplies KIT Dispense based on patient and insurance preference. Use up to four times daily as directed. (FOR ICD-9 250.00, 250.01). 11/22/16   Velvet Bathe, MD  blood glucose meter kit and supplies KIT Dispense based on patient and insurance preference. Use up to four times daily as directed. (FOR ICD-9 250.00, 250.01). 08/16/17   Charlynne Cousins, MD  ferrous sulfate 325 (65 FE) MG tablet Take 1 tablet (325 mg total) daily by mouth. Patient taking differently: Take 325 mg by mouth daily  as needed (low iron).  11/23/16   Ward, Delice Bison, DO  insulin aspart (NOVOLOG) 100 UNIT/ML injection Inject 8 Units into the skin 3 (three) times daily with meals. 08/16/17   Charlynne Cousins, MD  insulin glargine (LANTUS) 100 UNIT/ML injection Inject 0.22 mLs (22 Units total) into the skin every morning. 08/16/17   Charlynne Cousins, MD  Insulin Pen Needle 31G X 6 MM MISC 1 Device by Does not apply route 2 (two) times daily. 08/16/17   Charlynne Cousins, MD  lisinopril (PRINIVIL,ZESTRIL) 40 MG tablet Take  1 tablet (40 mg total) by mouth daily. 04/30/17   Dorie Rank, MD  metoprolol tartrate (LOPRESSOR) 25 MG tablet Take 0.5 tablets (12.5 mg total) by mouth 2 (two) times daily. 03/07/17   Georgette Shell, MD    Family History Family History  Problem Relation Age of Onset  . Diabetes Mellitus II Mother   . Diabetes Mellitus II Father     Social History Social History   Tobacco Use  . Smoking status: Current Every Day Smoker    Packs/day: 0.10    Years: 40.00    Pack years: 4.00    Types: Cigarettes  . Smokeless tobacco: Never Used  Substance Use Topics  . Alcohol use: Yes    Alcohol/week: 2.0 standard drinks    Types: 2 Cans of beer per week    Comment: No labs available at time of assessment  . Drug use: No    Comment: 12/23/2014 "stopped ~ 10 yrs ago"     Allergies   Ibuprofen and Tylenol [acetaminophen]   Review of Systems Review of Systems  All other systems reviewed and are negative.    Physical Exam Updated Vital Signs BP (!) 154/89 (BP Location: Right Arm)   Pulse 94   Temp 98.5 F (36.9 C) (Oral)   Resp 20   Ht '6\' 3"'$  (1.905 m)   Wt 68 kg   SpO2 100%   BMI 18.75 kg/m   Physical Exam  Constitutional: He appears well-developed. He appears distressed (Uncomfortable).  Unkempt, malnourished  HENT:  Head: Normocephalic and atraumatic.  Right Ear: External ear normal.  Left Ear: External ear normal.  Eyes: Pupils are equal, round, and reactive to light. Conjunctivae and EOM are normal.  Neck: Normal range of motion and phonation normal. Neck supple.  Cardiovascular: Normal rate.  Pulmonary/Chest: Effort normal. He exhibits no bony tenderness.  Musculoskeletal: Normal range of motion.  Neurological: He is alert. No cranial nerve deficit. He exhibits normal muscle tone. Coordination normal.  Skin: Skin is warm, dry and intact.  Psychiatric: He has a normal mood and affect. His behavior is normal.  Nursing note and vitals reviewed.    ED Treatments  / Results  Labs (all labs ordered are listed, but only abnormal results are displayed) Labs Reviewed  BASIC METABOLIC PANEL - Abnormal; Notable for the following components:      Result Value   Chloride 95 (*)    CO2 19 (*)    Glucose, Bld 750 (*)    BUN 45 (*)    Creatinine, Ser 1.84 (*)    GFR calc non Af Amer 40 (*)    GFR calc Af Amer 46 (*)    Anion gap 23 (*)    All other components within normal limits  CBC - Abnormal; Notable for the following components:   Hemoglobin 10.7 (*)    HCT 33.6 (*)    MCH 25.1 (*)  All other components within normal limits  CBG MONITORING, ED - Abnormal; Notable for the following components:   Glucose-Capillary >600 (*)    All other components within normal limits  I-STAT CHEM 8, ED - Abnormal; Notable for the following components:   Sodium 134 (*)    Chloride 97 (*)    BUN 42 (*)    Creatinine, Ser 1.60 (*)    Glucose, Bld >700 (*)    Hemoglobin 12.2 (*)    HCT 36.0 (*)    All other components within normal limits  URINALYSIS, ROUTINE W REFLEX MICROSCOPIC  CBG MONITORING, ED    EKG None  Radiology No results found.  Procedures .Critical Care Performed by: Daleen Bo, MD Authorized by: Daleen Bo, MD   Critical care provider statement:    Critical care time (minutes):  35   Critical care start time:  09/12/2017 10:45 PM   Critical care end time:  09/13/2017 12:15 AM   Critical care time was exclusive of:  Separately billable procedures and treating other patients   Critical care was necessary to treat or prevent imminent or life-threatening deterioration of the following conditions:  Metabolic crisis   Critical care was time spent personally by me on the following activities:  Blood draw for specimens, development of treatment plan with patient or surrogate, discussions with consultants, evaluation of patient's response to treatment, examination of patient, obtaining history from patient or surrogate, ordering and  performing treatments and interventions, ordering and review of laboratory studies, pulse oximetry, re-evaluation of patient's condition, review of old charts and ordering and review of radiographic studies   (including critical care time)  Medications Ordered in ED Medications  insulin glargine (LANTUS) injection 22 Units (has no administration in time range)  insulin aspart (novoLOG) injection 12 Units (has no administration in time range)  sodium chloride 0.9 % bolus 3,000 mL (has no administration in time range)     Initial Impression / Assessment and Plan / ED Course  I have reviewed the triage vital signs and the nursing notes.  Pertinent labs & imaging results that were available during my care of the patient were reviewed by me and considered in my medical decision making (see chart for details).  Clinical Course as of Sep 12 2341  Wed Sep 12, 2017  2341 Normal except sodium low, chloride low, BUN high, creatinine high, glucose high, hemoglobin low  I-Stat Chem 8, ED(!!) [EW]  2341 Normal except hemoglobin low  CBC(!) [EW]  2341 Normal except chloride low, CO2 low, glucose high, BUN high, creatinine high, GFR low  Basic metabolic panel(!!) [EW]    Clinical Course User Index [EW] Daleen Bo, MD   BUN  Date Value Ref Range Status  09/12/2017 42 (H) 6 - 20 mg/dL Final  09/12/2017 45 (H) 6 - 20 mg/dL Final  08/31/2017 20 6 - 20 mg/dL Final  08/15/2017 24 (H) 6 - 20 mg/dL Final   Creatinine, Ser  Date Value Ref Range Status  09/12/2017 1.60 (H) 0.61 - 1.24 mg/dL Final  09/12/2017 1.84 (H) 0.61 - 1.24 mg/dL Final  08/31/2017 0.80 0.61 - 1.24 mg/dL Final  08/15/2017 0.78 0.61 - 1.24 mg/dL Final   Hemoglobin  Date Value Ref Range Status  09/12/2017 12.2 (L) 13.0 - 17.0 g/dL Final  09/12/2017 10.7 (L) 13.0 - 17.0 g/dL Final  08/31/2017 9.5 (L) 13.0 - 17.0 g/dL Final  08/15/2017 8.0 (L) 13.0 - 17.0 g/dL Final    Patient Vitals for the past  24 hrs:  BP Temp Temp  src Pulse Resp SpO2 Height Weight  09/12/17 2003 (!) 154/89 98.5 F (36.9 C) Oral 94 20 100 % '6\' 3"'$  (1.905 m) 68 kg     Medical Decision Making: Hyperglycemia with medication noncompliance.  Patient is homeless.  Weight today 68 kg, is approximately 10 kilograms lower than weight in early June 2019.  Patient is very disheveled and appears malnourished.  Recent prolonged hospitalization, in a very debilitated patient.  At that time he presented with creatinine 2.5, and a hyperosmolar picture, somewhat different than today.  Significant volume depletion is suspected with elevated creatinine, 2 times baseline, currently.  Patient given IV fluids, subcutaneous insulin, and will be reassessed.  CRITICAL CARE-yes Performed by: Daleen Bo   Nursing Notes Reviewed/ Care Coordinated Applicable Imaging Reviewed Interpretation of Laboratory Data incorporated into ED treatment   Plan-as per oncoming provider team to evaluate after treatment, and reassess need for admission.    Final Clinical Impressions(s) / ED Diagnoses   Final diagnoses:  Hyperglycemia  Dehydration    ED Discharge Orders    None       Daleen Bo, MD 09/12/17 2359    Daleen Bo, MD 09/20/17 [phone]

## 2017-09-12 NOTE — ED Notes (Signed)
ED provider notified of critical BG. Giving pt water per provider.

## 2017-09-12 NOTE — ED Notes (Signed)
Bed: WTR5 Expected date:  Expected time:  Means of arrival:  Comments: Hold for triage

## 2017-09-12 NOTE — ED Triage Notes (Signed)
Pt presents to ED via EMS for hyperglycemia. EMS reports CBG read "HIGH." No other complaints.

## 2017-09-13 ENCOUNTER — Encounter (HOSPITAL_COMMUNITY): Payer: Self-pay | Admitting: Emergency Medicine

## 2017-09-13 ENCOUNTER — Emergency Department (HOSPITAL_COMMUNITY)
Admission: EM | Admit: 2017-09-13 | Discharge: 2017-09-13 | Disposition: A | Discharge status: Home / Self Care | Payer: Self-pay | Attending: Emergency Medicine | Admitting: Emergency Medicine

## 2017-09-13 ENCOUNTER — Other Ambulatory Visit: Payer: Self-pay

## 2017-09-13 DIAGNOSIS — R110 Nausea: Secondary | ICD-10-CM | POA: Insufficient documentation

## 2017-09-13 DIAGNOSIS — I10 Essential (primary) hypertension: Secondary | ICD-10-CM | POA: Insufficient documentation

## 2017-09-13 DIAGNOSIS — Z794 Long term (current) use of insulin: Secondary | ICD-10-CM | POA: Insufficient documentation

## 2017-09-13 DIAGNOSIS — F17210 Nicotine dependence, cigarettes, uncomplicated: Secondary | ICD-10-CM | POA: Insufficient documentation

## 2017-09-13 DIAGNOSIS — Z79899 Other long term (current) drug therapy: Secondary | ICD-10-CM | POA: Insufficient documentation

## 2017-09-13 DIAGNOSIS — E119 Type 2 diabetes mellitus without complications: Secondary | ICD-10-CM | POA: Insufficient documentation

## 2017-09-13 LAB — CBG MONITORING, ED
GLUCOSE-CAPILLARY: 288 mg/dL — AB (ref 70–99)
GLUCOSE-CAPILLARY: 110 mg/dL — AB (ref 70–99)
Glucose-Capillary: 453 mg/dL — ABNORMAL HIGH (ref 70–99)
Glucose-Capillary: 218 mg/dL — ABNORMAL HIGH (ref 70–99)
Glucose-Capillary: 69 mg/dL — ABNORMAL LOW (ref 70–99)

## 2017-09-13 LAB — BASIC METABOLIC PANEL
Anion gap: 12 (ref 5–15)
BUN: 39 mg/dL — AB (ref 6–20)
CHLORIDE: 107 mmol/L (ref 98–111)
CO2: 24 mmol/L (ref 22–32)
CREATININE: 1.46 mg/dL — AB (ref 0.61–1.24)
Calcium: 9.1 mg/dL (ref 8.9–10.3)
GFR calc Af Amer: >60 mL/min (ref >60)
GFR calc non Af Amer: 52 mL/min — ABNORMAL LOW (ref >60)
Glucose, Bld: 413 mg/dL — ABNORMAL HIGH (ref 70–99)
POTASSIUM: 3.8 mmol/L (ref 3.5–5.1)
Sodium: 143 mmol/L (ref 135–145)

## 2017-09-13 LAB — I-STAT CHEM 8, ED
BUN: 31 mg/dL — AB (ref 6–20)
CREATININE: 1.1 mg/dL (ref 0.61–1.24)
Calcium, Ion: 1.26 mmol/L (ref 1.15–1.40)
Chloride: 109 mmol/L (ref 98–111)
GLUCOSE: 74 mg/dL (ref 70–99)
HEMATOCRIT: 34 % — AB (ref 39.0–52.0)
HEMOGLOBIN: 11.6 g/dL — AB (ref 13.0–17.0)
POTASSIUM: 3.3 mmol/L — AB (ref 3.5–5.1)
Sodium: 144 mmol/L (ref 135–145)
TCO2: 26 mmol/L (ref 22–32)

## 2017-09-13 MED ORDER — INSULIN ASPART 100 UNIT/ML ~~LOC~~ SOLN
15.0000 [IU] | Freq: Once | SUBCUTANEOUS | Status: AC
  Administered 2017-09-13: 15 [IU] via SUBCUTANEOUS
  Filled 2017-09-13: qty 1

## 2017-09-13 NOTE — ED Provider Notes (Signed)
Patient well-known to this emergency department, signed out to me by Dr. Effie ShyWentz to follow-up on treatment for hyperglycemia.  Patient appeared dehydrated and hyperglycemic but not hyperosmolar or in DKA.  He was treated with insulin and IV fluids and has had significant improvement.  Blood sugar is now normalized.  He will be appropriate for discharge.   Gilda CreasePollina, Christopher J, MD 09/13/17 [phone]

## 2017-09-13 NOTE — ED Notes (Signed)
Patient provided with food

## 2017-09-13 NOTE — ED Triage Notes (Signed)
[facility] [address] [facility] of [facility]. Per EMS, patient sugar 76 on arrival.

## 2017-09-13 NOTE — ED Provider Notes (Signed)
[facility] DEPT Provider Note   CSN: [id number] Arrival date & time: 09/13/17  2050     History   Chief Complaint Chief Complaint  Patient presents with  . Nausea    HPI Mike [name] is a 57 y.o. male.  HPI Patient presents to the emergency department requesting something to eat.  He states he is nauseated because he has had nothing to eat today.  His blood sugar was 69 and arrival to the emergency department.  He has seen often here in the emergency department secondary to hyperglycemia.  He is homeless.  He states he was not able to eat of the shelters today.  He was last seen in the emergency department last night and discharged early this morning.  No other complaints.  At this time he states he needs nothing else from [place].  He is eating food.  He would like to go home.   Past Medical History:  Diagnosis Date  . Anxiety   . Anxiety   . Chronic lower back pain   . Depression   . DKA (diabetic ketoacidoses) ([place]) 07/30/2016  . Hyperlipemia   . Hypertension   . Migraine    "last one was ~ 4 yr ago" (12/23/2014)  . Seizures ([place])    "related to pills for anxiety; if I don't take the pills I'm suppose to take I'll have them" (12/23/2014)  . Type II diabetes mellitus [facility])     Patient Active Problem List   Diagnosis Date Noted  . 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 Hypernatremia   . Memory difficulties   . Type 2 diabetes mellitus with hyperosmolar nonketotic hyperglycemia ([place]) 08/03/2017  . Hyperglycemia 05/26/2017  . Noncompliance 05/26/2017  . AKI (acute kidney injury) ([place]) 03/04/2017  . Malnutrition of moderate degree 02/08/2017  . DKA (diabetic ketoacidoses) ([place]) 02/07/2017  . History of stroke 02/07/2017  . Hyperphosphatemia 12/13/2016  . Evaluation by psychiatric service required 11/30/2016  . Lactic acidosis 11/18/2016  . Anemia 11/06/2016  . HLD (hyperlipidemia) 11/06/2016  . Adjustment disorder with other  symptoms 11/05/2016  . Uncontrolled type 2 diabetes mellitus with hyperosmolar nonketotic hyperglycemia ([place]) 08/24/2016  . Essential hypertension 08/24/2016  . Acute ischemic stroke ([place])   . Protein-calorie malnutrition, severe 12/24/2014  . Homelessness 12/23/2014  . Hypertensive urgency 12/23/2014  . DM (diabetes mellitus) ([place]) 12/23/2014  . Intermittent palpitations 12/23/2014  . Tobacco abuse 12/23/2014  . Pleuritic chest pain 12/23/2014  . Abnormal EKG 12/23/2014  . Type II diabetes mellitus with renal manifestations [facility])     Past Surgical History:  Procedure Laterality Date  . NO PAST SURGERIES          Home Medications    Prior to Admission medications   Medication Sig Start Date End Date Taking? Authorizing Provider  amLODipine (NORVASC) 10 MG tablet Take 1 tablet (10 mg total) by mouth daily. 05/28/17   Dessa Phi, DO  atorvastatin (LIPITOR) 40 MG tablet Take 1 tablet (40 mg total) by mouth daily. 01/26/17   Cardama, Grayce Sessions, MD  blood glucose meter kit and supplies KIT Dispense based on patient and insurance preference. Use up to four times daily as directed. (FOR ICD-9 250.00, 250.01). 11/22/16   Velvet Bathe, MD  blood glucose meter kit and supplies KIT Dispense based on patient and insurance preference. Use up to four times daily as directed. (FOR ICD-9 250.00, 250.01). 08/16/17   Charlynne Cousins, MD  ferrous sulfate 325 (65 FE) MG  tablet Take 1 tablet (325 mg total) daily by mouth. Patient taking differently: Take 325 mg by mouth daily as needed (low iron).  11/23/16   Ward, Delice Bison, DO  insulin aspart (NOVOLOG) 100 UNIT/ML injection Inject 8 Units into the skin 3 (three) times daily with meals. 08/16/17   Charlynne Cousins, MD  insulin glargine (LANTUS) 100 UNIT/ML injection Inject 0.22 mLs (22 Units total) into the skin every morning. 08/16/17   Charlynne Cousins, MD  Insulin Pen Needle 31G X 6 MM MISC 1 Device by Does not apply route 2 (two) times  daily. 08/16/17   Charlynne Cousins, MD  lisinopril (PRINIVIL,ZESTRIL) 40 MG tablet Take 1 tablet (40 mg total) by mouth daily. 04/30/17   Dorie Rank, MD  metoprolol tartrate (LOPRESSOR) 25 MG tablet Take 0.5 tablets (12.5 mg total) by mouth 2 (two) times daily. 03/07/17   Georgette Shell, MD    Family History Family History  Problem Relation Age of Onset  . Diabetes Mellitus II Mother   . Diabetes Mellitus II Father     Social History Social History   Tobacco Use  . Smoking status: Current Every Day Smoker    Packs/day: 0.10    Years: 40.00    Pack years: 4.00    Types: Cigarettes  . Smokeless tobacco: Never Used  Substance Use Topics  . Alcohol use: Yes    Alcohol/week: 2.0 standard drinks    Types: 2 Cans of beer per week    Comment: No labs available at time of assessment  . Drug use: No    Comment: 12/23/2014 "stopped ~ 10 yrs ago"     Allergies   Ibuprofen and Tylenol [acetaminophen]   Review of Systems Review of Systems  All other systems reviewed and are negative.    Physical Exam Updated Vital Signs BP (!) 168/82 (BP Location: Right Arm)   Pulse 77   Temp 98 F (36.7 C) (Oral)   Resp 18   SpO2 98%   Physical Exam  Constitutional: He is oriented to person, place, and time. He appears well-developed and well-nourished.  HENT:  Head: Normocephalic.  Eyes: EOM are normal.  Neck: Normal range of motion.  Pulmonary/Chest: Effort normal.  Abdominal: He exhibits no distension.  Musculoskeletal: Normal range of motion.  Neurological: He is alert and oriented to person, place, and time.  Psychiatric: He has a normal mood and affect.  Nursing note and vitals reviewed.    ED Treatments / Results  Labs (all labs ordered are listed, but only abnormal results are displayed) Labs Reviewed  I-STAT CHEM 8, ED - Abnormal; Notable for the following components:      Result Value   Potassium 3.3 (*)    BUN 31 (*)    Hemoglobin 11.6 (*)    HCT 34.0 (*)     All other components within normal limits  CBG MONITORING, ED - Abnormal; Notable for the following components:   Glucose-Capillary 69 (*)    All other components within normal limits    EKG None  Radiology No results found.  Procedures Procedures (including critical care time)  Medications Ordered in ED Medications - No data to display   Initial Impression / Assessment and Plan / ED Course  I have reviewed the triage vital signs and the nursing notes.  Pertinent labs & imaging results that were available during my care of the patient were reviewed by me and considered in my medical decision making (see chart  for details).     I do not think the patient is additional work-up at this time.  Discharged home in good condition peer  Final Clinical Impressions(s) / ED Diagnoses   Final diagnoses:  Nausea    ED Discharge Orders    None       Jola Schmidt, MD 09/13/17 2114

## 2017-09-13 NOTE — ED Notes (Signed)
Bed: WA03 Expected date:  Expected time:  Means of arrival:  Comments: 38M nausea/HTN

## 2017-09-13 NOTE — ED Notes (Signed)
Pt offered to take a shower, pt refused. Pt given new pair of scrubs to put on, pt refused. Pt escorted to lobby and given bus pass.

## 2017-09-13 NOTE — ED Notes (Signed)
Patient provided with 2 chicken noodle soups, crackers and two sandwiches during his stay to bring up BG. Patient given granola bar and soup to leave with also.

## 2017-09-13 NOTE — ED Notes (Signed)
Patient states he is nauseated due to hunger.

## 2017-09-14 ENCOUNTER — Emergency Department (HOSPITAL_COMMUNITY)
Admission: EM | Admit: 2017-09-14 | Discharge: 2017-09-14 | Disposition: A | Discharge status: Home / Self Care | Payer: Self-pay | Attending: Emergency Medicine | Admitting: Emergency Medicine

## 2017-09-14 ENCOUNTER — Encounter (HOSPITAL_COMMUNITY): Payer: Self-pay | Admitting: Emergency Medicine

## 2017-09-14 DIAGNOSIS — Z8673 Personal history of transient ischemic attack (TIA), and cerebral infarction without residual deficits: Secondary | ICD-10-CM | POA: Insufficient documentation

## 2017-09-14 DIAGNOSIS — I10 Essential (primary) hypertension: Secondary | ICD-10-CM | POA: Insufficient documentation

## 2017-09-14 DIAGNOSIS — Z79899 Other long term (current) drug therapy: Secondary | ICD-10-CM | POA: Insufficient documentation

## 2017-09-14 DIAGNOSIS — F17210 Nicotine dependence, cigarettes, uncomplicated: Secondary | ICD-10-CM | POA: Insufficient documentation

## 2017-09-14 DIAGNOSIS — E785 Hyperlipidemia, unspecified: Secondary | ICD-10-CM | POA: Insufficient documentation

## 2017-09-14 DIAGNOSIS — Z794 Long term (current) use of insulin: Secondary | ICD-10-CM | POA: Insufficient documentation

## 2017-09-14 DIAGNOSIS — R739 Hyperglycemia, unspecified: Secondary | ICD-10-CM

## 2017-09-14 DIAGNOSIS — E1165 Type 2 diabetes mellitus with hyperglycemia: Secondary | ICD-10-CM | POA: Insufficient documentation

## 2017-09-14 LAB — I-STAT CHEM 8, ED
BUN: 31 mg/dL — AB (ref 6–20)
CREATININE: 1.1 mg/dL (ref 0.61–1.24)
Calcium, Ion: 1.17 mmol/L (ref 1.15–1.40)
Chloride: 104 mmol/L (ref 98–111)
Glucose, Bld: 450 mg/dL — ABNORMAL HIGH (ref 70–99)
HEMATOCRIT: 35 % — AB (ref 39.0–52.0)
Hemoglobin: 11.9 g/dL — ABNORMAL LOW (ref 13.0–17.0)
Potassium: 3.8 mmol/L (ref 3.5–5.1)
Sodium: 139 mmol/L (ref 135–145)
TCO2: 24 mmol/L (ref 22–32)

## 2017-09-14 LAB — CBG MONITORING, ED
GLUCOSE-CAPILLARY: 436 mg/dL — AB (ref 70–99)
Glucose-Capillary: 439 mg/dL — ABNORMAL HIGH (ref 70–99)

## 2017-09-14 MED ORDER — INSULIN ASPART 100 UNIT/ML ~~LOC~~ SOLN
8.0000 [IU] | Freq: Once | SUBCUTANEOUS | Status: AC
  Administered 2017-09-14: 8 [IU] via SUBCUTANEOUS
  Filled 2017-09-14: qty 1

## 2017-09-14 NOTE — ED Provider Notes (Signed)
[place] DEPT Provider Note   CSN: [id number] Arrival date & time: 09/14/17  1048     History   Chief Complaint Chief Complaint  Patient presents with  . Hungry    HPI Mike [name] is a 57 y.o. male.  57 year old male with prior medical history as detailed below presents because he "was hungry." Accucheck BG obtained in triage demonstrated hyperglycemia. It is difficult to ascertain from the patient when he last took insulin or when he last ate.   Patient is otherwise without complaint.   The history is provided by the patient and medical records.  Illness  This is a new problem. Episode onset: uncertain. The problem occurs constantly. The problem has not changed since onset.Pertinent negatives include no chest pain, no abdominal pain, no headaches and no shortness of breath. Nothing aggravates the symptoms. Nothing relieves the symptoms.    Past Medical History:  Diagnosis Date  . Anxiety   . Anxiety   . Chronic lower back pain   . Depression   . DKA (diabetic ketoacidoses) ([place]) 07/30/2016  . Hyperlipemia   . Hypertension   . Migraine    "last one was ~ 4 yr ago" (12/23/2014)  . Seizures ([place])    "related to pills for anxiety; if I don't take the pills I'm suppose to take I'll have them" (12/23/2014)  . Type II diabetes mellitus [facility])     Patient Active Problem List   Diagnosis Date Noted  . 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 Hypernatremia   . Memory difficulties   . Type 2 diabetes mellitus with hyperosmolar nonketotic hyperglycemia ([place]) 08/03/2017  . Hyperglycemia 05/26/2017  . Noncompliance 05/26/2017  . AKI (acute kidney injury) ([place]) 03/04/2017  . Malnutrition of moderate degree 02/08/2017  . DKA (diabetic ketoacidoses) ([place]) 02/07/2017  . History of stroke 02/07/2017  . Hyperphosphatemia 12/13/2016  . Evaluation by psychiatric service required 11/30/2016  . Lactic acidosis 11/18/2016  . Anemia 11/06/2016   . HLD (hyperlipidemia) 11/06/2016  . Adjustment disorder with other symptoms 11/05/2016  . Uncontrolled type 2 diabetes mellitus with hyperosmolar nonketotic hyperglycemia ([place]) 08/24/2016  . Essential hypertension 08/24/2016  . Acute ischemic stroke ([place])   . Protein-calorie malnutrition, severe 12/24/2014  . Homelessness 12/23/2014  . Hypertensive urgency 12/23/2014  . DM (diabetes mellitus) ([place]) 12/23/2014  . Intermittent palpitations 12/23/2014  . Tobacco abuse 12/23/2014  . Pleuritic chest pain 12/23/2014  . Abnormal EKG 12/23/2014  . Type II diabetes mellitus with renal manifestations [facility])     Past Surgical History:  Procedure Laterality Date  . NO PAST SURGERIES          Home Medications    Prior to Admission medications   Medication Sig Start Date End Date Taking? Authorizing Provider  amLODipine (NORVASC) 10 MG tablet Take 1 tablet (10 mg total) by mouth daily. 05/28/17  Yes Dessa Phi, DO  atorvastatin (LIPITOR) 40 MG tablet Take 1 tablet (40 mg total) by mouth daily. 01/26/17  Yes Cardama, Grayce Sessions, MD  ferrous sulfate 325 (65 FE) MG tablet Take 1 tablet (325 mg total) daily by mouth. Patient taking differently: Take 325 mg by mouth daily as needed (low iron).  11/23/16  Yes Ward, Cyril Mourning N, DO  insulin aspart (NOVOLOG) 100 UNIT/ML injection Inject 8 Units into the skin 3 (three) times daily with meals. 08/16/17  Yes Charlynne Cousins, MD  insulin glargine (LANTUS) 100 UNIT/ML injection Inject 0.22 mLs (22 Units total) into the  skin every morning. 08/16/17  Yes Charlynne Cousins, MD  lisinopril (PRINIVIL,ZESTRIL) 40 MG tablet Take 1 tablet (40 mg total) by mouth daily. 04/30/17  Yes Dorie Rank, MD  metoprolol tartrate (LOPRESSOR) 25 MG tablet Take 0.5 tablets (12.5 mg total) by mouth 2 (two) times daily. 03/07/17  Yes Georgette Shell, MD  blood glucose meter kit and supplies KIT Dispense based on patient and insurance preference. Use up to four times  daily as directed. (FOR ICD-9 250.00, 250.01). 11/22/16   Velvet Bathe, MD  blood glucose meter kit and supplies KIT Dispense based on patient and insurance preference. Use up to four times daily as directed. (FOR ICD-9 250.00, 250.01). 08/16/17   Charlynne Cousins, MD  Insulin Pen Needle 31G X 6 MM MISC 1 Device by Does not apply route 2 (two) times daily. 08/16/17   Charlynne Cousins, MD    Family History Family History  Problem Relation Age of Onset  . Diabetes Mellitus II Mother   . Diabetes Mellitus II Father     Social History Social History   Tobacco Use  . Smoking status: Current Every Day Smoker    Packs/day: 0.10    Years: 40.00    Pack years: 4.00    Types: Cigarettes  . Smokeless tobacco: Never Used  Substance Use Topics  . Alcohol use: Yes    Alcohol/week: 2.0 standard drinks    Types: 2 Cans of beer per week    Comment: No labs available at time of assessment  . Drug use: No    Comment: 12/23/2014 "stopped ~ 10 yrs ago"     Allergies   Ibuprofen and Tylenol [acetaminophen]   Review of Systems Review of Systems  Respiratory: Negative for shortness of breath.   Cardiovascular: Negative for chest pain.  Gastrointestinal: Negative for abdominal pain.  Neurological: Negative for headaches.  All other systems reviewed and are negative.    Physical Exam Updated Vital Signs BP 125/60 (BP Location: Left Arm)   Pulse 95   Temp 98.9 F (37.2 C) (Oral)   Resp 18   SpO2 100%   Physical Exam  Constitutional: He is oriented to person, place, and time. He appears well-developed and well-nourished. No distress.  Unkempt  HENT:  Head: Normocephalic and atraumatic.  Mouth/Throat: Oropharynx is clear and moist.  Eyes: Pupils are equal, round, and reactive to light. Conjunctivae and EOM are normal.  Neck: Normal range of motion. Neck supple.  Cardiovascular: Normal rate, regular rhythm and normal heart sounds.  Pulmonary/Chest: Effort normal and breath sounds  normal. No respiratory distress.  Abdominal: Soft. He exhibits no distension. There is no tenderness.  Musculoskeletal: Normal range of motion. He exhibits no edema or deformity.  Neurological: He is alert and oriented to person, place, and time.  Skin: Skin is warm and dry.  Psychiatric: He has a normal mood and affect.  Nursing note and vitals reviewed.    ED Treatments / Results  Labs (all labs ordered are listed, but only abnormal results are displayed) Labs Reviewed  CBG MONITORING, ED - Abnormal; Notable for the following components:      Result Value   Glucose-Capillary 436 (*)    All other components within normal limits  I-STAT CHEM 8, ED - Abnormal; Notable for the following components:   BUN 31 (*)    Glucose, Bld 450 (*)    Hemoglobin 11.9 (*)    HCT 35.0 (*)    All other components within normal  limits  CBG MONITORING, ED - Abnormal; Notable for the following components:   Glucose-Capillary 439 (*)    All other components within normal limits    EKG None  Radiology No results found.  Procedures Procedures (including critical care time)  Medications Ordered in ED Medications  insulin aspart (novoLOG) injection 8 Units (8 Units Subcutaneous Given 09/14/17 1254)     Initial Impression / Assessment and Plan / ED Course  I have reviewed the triage vital signs and the nursing notes.  Pertinent labs & imaging results that were available during my care of the patient were reviewed by me and considered in my medical decision making (see chart for details).    MDM  Screen complete   Patient is presenting for evaluation of his hunger.  He appears to be homeless.  Patient was given food.  Screening Accu-Chek revealed mild hyperglycemia.  This was treated with insulin in the ED.  He does not appear to have any other evidence of significant emergent issue.  Patient safe to discharge.  Final Clinical Impressions(s) / ED Diagnoses   Final diagnoses:    Hyperglycemia    ED Discharge Orders    None       Valarie Merino, MD 09/14/17 1336

## 2017-09-14 NOTE — Discharge Instructions (Addendum)
Please return for any problem. Follow up as instructed.  °

## 2017-09-14 NOTE — ED Notes (Signed)
Bed: [facility]PT4 Expected date:  Expected time:  Means of arrival:  Comments: 

## 2017-09-14 NOTE — ED Triage Notes (Signed)
Patient here from side of road. Bystander called because patient was standing on side of road.

## 2017-10-02 ENCOUNTER — Emergency Department (HOSPITAL_COMMUNITY)
Admission: EM | Admit: 2017-10-02 | Discharge: 2017-10-03 | Disposition: A | Discharge status: Home / Self Care | Payer: Self-pay | Attending: Emergency Medicine | Admitting: Emergency Medicine

## 2017-10-02 ENCOUNTER — Other Ambulatory Visit: Payer: Self-pay

## 2017-10-02 ENCOUNTER — Encounter (HOSPITAL_COMMUNITY): Payer: Self-pay | Admitting: Emergency Medicine

## 2017-10-02 DIAGNOSIS — E785 Hyperlipidemia, unspecified: Secondary | ICD-10-CM | POA: Insufficient documentation

## 2017-10-02 DIAGNOSIS — I10 Essential (primary) hypertension: Secondary | ICD-10-CM | POA: Insufficient documentation

## 2017-10-02 DIAGNOSIS — Z794 Long term (current) use of insulin: Secondary | ICD-10-CM | POA: Insufficient documentation

## 2017-10-02 DIAGNOSIS — E1165 Type 2 diabetes mellitus with hyperglycemia: Secondary | ICD-10-CM | POA: Insufficient documentation

## 2017-10-02 DIAGNOSIS — Z79899 Other long term (current) drug therapy: Secondary | ICD-10-CM | POA: Insufficient documentation

## 2017-10-02 DIAGNOSIS — Z8673 Personal history of transient ischemic attack (TIA), and cerebral infarction without residual deficits: Secondary | ICD-10-CM | POA: Insufficient documentation

## 2017-10-02 DIAGNOSIS — R739 Hyperglycemia, unspecified: Secondary | ICD-10-CM

## 2017-10-02 DIAGNOSIS — F17210 Nicotine dependence, cigarettes, uncomplicated: Secondary | ICD-10-CM | POA: Insufficient documentation

## 2017-10-02 LAB — I-STAT CHEM 8, ED
BUN: 23 mg/dL — ABNORMAL HIGH (ref 6–20)
Calcium, Ion: 1.2 mmol/L (ref 1.15–1.40)
Chloride: 103 mmol/L (ref 98–111)
Creatinine, Ser: 1 mg/dL (ref 0.61–1.24)
GLUCOSE: 304 mg/dL — AB (ref 70–99)
HCT: 25 % — ABNORMAL LOW (ref 39.0–52.0)
HEMOGLOBIN: 8.5 g/dL — AB (ref 13.0–17.0)
POTASSIUM: 3.7 mmol/L (ref 3.5–5.1)
SODIUM: 139 mmol/L (ref 135–145)
TCO2: 29 mmol/L (ref 22–32)

## 2017-10-02 LAB — CBG MONITORING, ED: GLUCOSE-CAPILLARY: 278 mg/dL — AB (ref 70–99)

## 2017-10-02 NOTE — ED Triage Notes (Signed)
Patient here with hyperglycemia.  Patient VS 168/88, 74 NSR, 16, 99% on monitor.  18GLH

## 2017-10-02 NOTE — ED Notes (Signed)
CBG=278 

## 2017-10-03 ENCOUNTER — Other Ambulatory Visit: Payer: Self-pay

## 2017-10-03 ENCOUNTER — Encounter (HOSPITAL_COMMUNITY): Payer: Self-pay

## 2017-10-03 ENCOUNTER — Emergency Department (HOSPITAL_COMMUNITY)
Admission: EM | Admit: 2017-10-03 | Discharge: 2017-10-03 | Disposition: A | Discharge status: Home / Self Care | Payer: Self-pay | Attending: Emergency Medicine | Admitting: Emergency Medicine

## 2017-10-03 DIAGNOSIS — R739 Hyperglycemia, unspecified: Secondary | ICD-10-CM

## 2017-10-03 DIAGNOSIS — E1165 Type 2 diabetes mellitus with hyperglycemia: Secondary | ICD-10-CM | POA: Insufficient documentation

## 2017-10-03 LAB — I-STAT CHEM 8, ED
BUN: 17 mg/dL (ref 6–20)
CALCIUM ION: 1.21 mmol/L (ref 1.15–1.40)
CHLORIDE: 94 mmol/L — AB (ref 98–111)
Creatinine, Ser: 0.8 mg/dL (ref 0.61–1.24)
Glucose, Bld: >700 mg/dL (ref 70–99)
HEMATOCRIT: 28 % — AB (ref 39.0–52.0)
HEMOGLOBIN: 9.5 g/dL — AB (ref 13.0–17.0)
Potassium: 4.5 mmol/L (ref 3.5–5.1)
SODIUM: 131 mmol/L — AB (ref 135–145)
TCO2: 28 mmol/L (ref 22–32)

## 2017-10-03 LAB — CBG MONITORING, ED

## 2017-10-03 MED ORDER — INSULIN ASPART PROT & ASPART (70-30 MIX) 100 UNIT/ML ~~LOC~~ SUSP
8.0000 [IU] | Freq: Once | SUBCUTANEOUS | Status: DC
  Filled 2017-10-03: qty 10

## 2017-10-03 MED ORDER — INSULIN ASPART 100 UNIT/ML ~~LOC~~ SOLN
8.0000 [IU] | Freq: Once | SUBCUTANEOUS | Status: AC
  Administered 2017-10-03: 8 [IU] via SUBCUTANEOUS
  Filled 2017-10-03: qty 1

## 2017-10-03 NOTE — Discharge Instructions (Addendum)
You were seen in the emergency room for high blood sugars.  You need to take your medicine regularly and stick to a diabetic diet.  Follow-up with your doctor.

## 2017-10-03 NOTE — ED Notes (Signed)
Notified EDP,Butler,MD. Pt. I-stat Chem 8 results sodium 131 and glucose >700 and RN,Lilibeth made aware.

## 2017-10-03 NOTE — Discharge Instructions (Signed)
Sugar is a little high today but not critical. Continue your regular medications. Follow-up with your primary care doctor. Return here for new concerns.

## 2017-10-03 NOTE — ED Provider Notes (Signed)
[name] Provider Note   CSN: [id number] Arrival date & time: 10/03/17  1842     History   Chief Complaint Chief Complaint  Patient presents with  . Hyperglycemia    HPI Mike [name] is a 57 y.o. male.  Patient is here by ambulance after he called EMS feeling sick and that his blood Mike was high.  Patient has a care plan in place and is in the ED frequently for similar complaints.  EMS noted the patient was eating food at the time and in no distress.  Patient states he last took his medicine this morning.  Patient was in the hospital last evening with similar complaints.  He denies any chest pain or abdominal pain.  The history is provided by the patient.  Hyperglycemia  Blood Mike level PTA:  >600 Onset quality:  Unable to specify Chronicity:  Recurrent Diabetes status:  Controlled with insulin Context: noncompliance   Associated symptoms: no abdominal pain, no chest pain, no dysuria, no fever and no shortness of breath     Past Medical History:  Diagnosis Date  . Anxiety   . Anxiety   . Chronic lower back pain   . Depression   . DKA (diabetic ketoacidoses) (Mike [name]) 07/30/2016  . Hyperlipemia   . Hypertension   . Migraine    "last one was ~ 4 yr ago" (12/23/2014)  . Seizures ([place])    "related to pills for anxiety; if I don't take the pills I'm suppose to take I'll have them" (12/23/2014)  . Type II diabetes mellitus [facility])     Patient Active Problem List   Diagnosis Date Noted  . Brittle diabetes mellitus (Mike [name]) 08/15/2017  . Hypernatremia   . Memory difficulties   . Type 2 diabetes mellitus with hyperosmolar nonketotic hyperglycemia (Mike [name]) 08/03/2017  . Hyperglycemia 05/26/2017  . Noncompliance 05/26/2017  . AKI (acute kidney injury) ([place]) 03/04/2017  . Malnutrition of moderate degree 02/08/2017  . DKA (diabetic ketoacidoses) (Mike [name]) 02/07/2017  . History of stroke 02/07/2017  . Hyperphosphatemia 12/13/2016  . Evaluation  by psychiatric service required 11/30/2016  . Lactic acidosis 11/18/2016  . Anemia 11/06/2016  . HLD (hyperlipidemia) 11/06/2016  . Adjustment disorder with other symptoms 11/05/2016  . Uncontrolled type 2 diabetes mellitus with hyperosmolar nonketotic hyperglycemia (Mike [name]) 08/24/2016  . Essential hypertension 08/24/2016  . Acute ischemic stroke (Mike [name])   . Protein-calorie malnutrition, severe 12/24/2014  . Homelessness 12/23/2014  . Hypertensive urgency 12/23/2014  . DM (diabetes mellitus) (Mike [name]) 12/23/2014  . Intermittent palpitations 12/23/2014  . Tobacco abuse 12/23/2014  . Pleuritic chest pain 12/23/2014  . Abnormal EKG 12/23/2014  . Type II diabetes mellitus with renal manifestations Mike [name])     Past Surgical History:  Procedure Laterality Date  . NO PAST SURGERIES          Home Medications    Prior to Admission medications   Medication Sig Start Date End Date Taking? Authorizing Provider  amLODipine (NORVASC) 10 MG tablet Take 1 tablet (10 mg total) by mouth daily. 05/28/17   Dessa Phi, DO  atorvastatin (LIPITOR) 40 MG tablet Take 1 tablet (40 mg total) by mouth daily. 01/26/17   Cardama, Grayce Sessions, MD  blood glucose meter kit and supplies KIT Dispense based on patient and insurance preference. Use up to four times daily as directed. (FOR ICD-9 250.00, 250.01). 11/22/16   Velvet Bathe, MD  blood glucose meter kit and supplies KIT Dispense based on patient and insurance preference. Use up  to four times daily as directed. (FOR ICD-9 250.00, 250.01). 08/16/17   Charlynne Cousins, MD  ferrous sulfate 325 (65 FE) MG tablet Take 1 tablet (325 mg total) daily by mouth. Patient taking differently: Take 325 mg by mouth daily as needed (low iron).  11/23/16   Ward, Delice Bison, DO  insulin aspart (NOVOLOG) 100 UNIT/ML injection Inject 8 Units into the skin 3 (three) times daily with meals. 08/16/17   Charlynne Cousins, MD  insulin glargine (LANTUS) 100 UNIT/ML injection Inject 0.22  mLs (22 Units total) into the skin every morning. 08/16/17   Charlynne Cousins, MD  Insulin Pen Needle 31G X 6 MM MISC 1 Device by Does not apply route 2 (two) times daily. 08/16/17   Charlynne Cousins, MD  lisinopril (PRINIVIL,ZESTRIL) 40 MG tablet Take 1 tablet (40 mg total) by mouth daily. 04/30/17   Dorie Rank, MD  metoprolol tartrate (LOPRESSOR) 25 MG tablet Take 0.5 tablets (12.5 mg total) by mouth 2 (two) times daily. 03/07/17   Georgette Shell, MD    Family History Family History  Problem Relation Age of Onset  . Diabetes Mellitus II Mother   . Diabetes Mellitus II Father     Social History Social History   Tobacco Use  . Smoking status: Current Every Day Smoker    Packs/day: 0.10    Years: 40.00    Pack years: 4.00    Types: Cigarettes  . Smokeless tobacco: Never Used  Substance Use Topics  . Alcohol use: Yes    Alcohol/week: 2.0 standard drinks    Types: 2 Cans of beer per week    Comment: No labs available at time of assessment  . Drug use: No    Comment: 12/23/2014 "stopped ~ 10 yrs ago"     Allergies   Ibuprofen and Tylenol [acetaminophen]   Review of Systems Review of Systems  Constitutional: Negative for fever.  HENT: Negative for sore throat.   Eyes: Negative for visual disturbance.  Respiratory: Negative for shortness of breath.   Cardiovascular: Negative for chest pain.  Gastrointestinal: Negative for abdominal pain.  Genitourinary: Negative for dysuria.  Musculoskeletal: Negative for neck pain.  Skin: Negative for rash.  Neurological: Negative for headaches.     Physical Exam Updated Vital Signs BP (!) 182/79   Pulse 81   Temp 98.4 F (36.9 C) (Oral)   Resp 16   SpO2 99%   Physical Exam  Constitutional: He appears well-developed and well-nourished.  HENT:  Head: Normocephalic and atraumatic.  Eyes: Conjunctivae are normal.  Neck: Neck supple.  Cardiovascular: Normal rate, regular rhythm and normal heart sounds.    Pulmonary/Chest: Effort normal. He has no wheezes. He has no rales.  Abdominal: Soft. There is no tenderness. There is no guarding.  Musculoskeletal: He exhibits no edema or deformity.  Neurological: He is alert. GCS eye subscore is 4. GCS verbal subscore is 5. GCS motor subscore is 6.  Skin: Skin is warm and dry.  Psychiatric: He has a normal mood and affect.  Nursing note and vitals reviewed.    ED Treatments / Results  Labs (all labs ordered are listed, but only abnormal results are displayed) Labs Reviewed  CBG MONITORING, ED - Abnormal; Notable for the following components:      Result Value   Glucose-Capillary >600 (*)    All other components within normal limits  I-STAT CHEM 8, ED - Abnormal; Notable for the following components:   Sodium 131 (*)  Chloride 94 (*)    Glucose, Bld >700 (*)    Hemoglobin 9.5 (*)    HCT 28.0 (*)    All other components within normal limits    EKG None  Radiology No results found.  Procedures Procedures (including critical care time)  Medications Ordered in ED Medications  insulin aspart (novoLOG) injection 8 Units (8 Units Subcutaneous Given 10/03/17 2006)     Initial Impression / Assessment and Plan / ED Course  I have reviewed the triage vital signs and the nursing notes.  Pertinent labs & imaging results that were available during my care of the patient were reviewed by me and considered in my medical decision making (see chart for details).  Clinical Course as of Oct 04 1149  Wed Oct 03, 2017  1956 Patient's i-STAT with markedly elevated glucose but normal bicarb.  His hemoglobin is low unchanged from yesterday.    [MB]    Clinical Course User Index [MB] [facility] Rasmussen, MD     Final Clinical Impressions(s) / ED Diagnoses   Final diagnoses:  Hyperglycemia    ED Discharge Orders    None       [facility] Rasmussen, MD 10/04/17 1152

## 2017-10-03 NOTE — ED Triage Notes (Signed)
[facility] workers called [facility], upon EMS arrival Pt was eating food in booth. Hx of Diabetes, non compliant with meds. EMS reports CBG read of "HIGH" on glucometer. Seen here yesterday for same.  BP 182/96 HR 78 RR 20 Sp02 100 RA CBG "HIGH"  18ga R forearm 500ml NS enroute

## 2017-10-03 NOTE — ED Provider Notes (Signed)
[facility] EMERGENCY DEPARTMENT Provider Note   CSN: [id number] Arrival date & time: 10/02/17  2037     History   Chief Complaint Chief Complaint  Patient presents with  . Hyperglycemia    HPI Mike [name] is a 57 y.o. male.  The history is provided by the patient and medical records.  Hyperglycemia     57 year old male with history of anxiety, chronic back pain, depression, hypertension, hyperlipidemia, diabetes, presenting to the ED with hyperglycemia.  Patient well-known to this facility as well as surrounding emergency room's for same.  He has 66 visits in the past 6 months.  States he felt like his sugar was high today but did not check it.  Reports some frequent urination.  Denies eating a lot of sugar or carbohydrates.  Last had his medicine this morning.  He is supposed to follow-up with the [facility] daily.  Past Medical History:  Diagnosis Date  . Anxiety   . Anxiety   . Chronic lower back pain   . Depression   . DKA (diabetic ketoacidoses) ([place]) 07/30/2016  . Hyperlipemia   . Hypertension   . Migraine    "last one was ~ 4 yr ago" (12/23/2014)  . Seizures ([place])    "related to pills for anxiety; if I don't take the pills I'm suppose to take I'll have them" (12/23/2014)  . Type II diabetes mellitus [facility])     Patient Active Problem List   Diagnosis Date Noted  . 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 Hypernatremia   . Memory difficulties   . Type 2 diabetes mellitus with hyperosmolar nonketotic hyperglycemia ([place]) 08/03/2017  . Hyperglycemia 05/26/2017  . Noncompliance 05/26/2017  . AKI (acute kidney injury) ([place]) 03/04/2017  . Malnutrition of moderate degree 02/08/2017  . DKA (diabetic ketoacidoses) ([place]) 02/07/2017  . History of stroke 02/07/2017  . Hyperphosphatemia 12/13/2016  . Evaluation by psychiatric service required 11/30/2016  . Lactic acidosis 11/18/2016  . Anemia 11/06/2016  . HLD (hyperlipidemia) 11/06/2016  .  Adjustment disorder with other symptoms 11/05/2016  . Uncontrolled type 2 diabetes mellitus with hyperosmolar nonketotic hyperglycemia ([place]) 08/24/2016  . Essential hypertension 08/24/2016  . Acute ischemic stroke ([place])   . Protein-calorie malnutrition, severe 12/24/2014  . Homelessness 12/23/2014  . Hypertensive urgency 12/23/2014  . DM (diabetes mellitus) ([place]) 12/23/2014  . Intermittent palpitations 12/23/2014  . Tobacco abuse 12/23/2014  . Pleuritic chest pain 12/23/2014  . Abnormal EKG 12/23/2014  . Type II diabetes mellitus with renal manifestations [facility])     Past Surgical History:  Procedure Laterality Date  . NO PAST SURGERIES          Home Medications    Prior to Admission medications   Medication Sig Start Date End Date Taking? Authorizing Provider  amLODipine (NORVASC) 10 MG tablet Take 1 tablet (10 mg total) by mouth daily. 05/28/17   Dessa Phi, DO  atorvastatin (LIPITOR) 40 MG tablet Take 1 tablet (40 mg total) by mouth daily. 01/26/17   Cardama, Grayce Sessions, MD  blood glucose meter kit and supplies KIT Dispense based on patient and insurance preference. Use up to four times daily as directed. (FOR ICD-9 250.00, 250.01). 11/22/16   Velvet Bathe, MD  blood glucose meter kit and supplies KIT Dispense based on patient and insurance preference. Use up to four times daily as directed. (FOR ICD-9 250.00, 250.01). 08/16/17   Charlynne Cousins, MD  ferrous sulfate 325 (65 FE) MG tablet Take 1 tablet (325 mg  total) daily by mouth. Patient taking differently: Take 325 mg by mouth daily as needed (low iron).  11/23/16   Ward, Delice Bison, DO  insulin aspart (NOVOLOG) 100 UNIT/ML injection Inject 8 Units into the skin 3 (three) times daily with meals. 08/16/17   Charlynne Cousins, MD  insulin glargine (LANTUS) 100 UNIT/ML injection Inject 0.22 mLs (22 Units total) into the skin every morning. 08/16/17   Charlynne Cousins, MD  Insulin Pen Needle 31G X 6 MM MISC 1 Device by  Does not apply route 2 (two) times daily. 08/16/17   Charlynne Cousins, MD  lisinopril (PRINIVIL,ZESTRIL) 40 MG tablet Take 1 tablet (40 mg total) by mouth daily. 04/30/17   Dorie Rank, MD  metoprolol tartrate (LOPRESSOR) 25 MG tablet Take 0.5 tablets (12.5 mg total) by mouth 2 (two) times daily. 03/07/17   Georgette Shell, MD    Family History Family History  Problem Relation Age of Onset  . Diabetes Mellitus II Mother   . Diabetes Mellitus II Father     Social History Social History   Tobacco Use  . Smoking status: Current Every Day Smoker    Packs/day: 0.10    Years: 40.00    Pack years: 4.00    Types: Cigarettes  . Smokeless tobacco: Never Used  Substance Use Topics  . Alcohol use: Yes    Alcohol/week: 2.0 standard drinks    Types: 2 Cans of beer per week    Comment: No labs available at time of assessment  . Drug use: No    Comment: 12/23/2014 "stopped ~ 10 yrs ago"     Allergies   Ibuprofen and Tylenol [acetaminophen]   Review of Systems Review of Systems  Endocrine:       Hyperglycemia  All other systems reviewed and are negative.    Physical Exam Updated Vital Signs BP (!) 164/57 (BP Location: Left Arm)   Pulse (!) 51   Temp 98.8 F (37.1 C) (Oral)   Resp 16   SpO2 100%   Physical Exam  Constitutional: He is oriented to person, place, and time. He appears well-developed and well-nourished.  Disheveled, appears at his baseline  HENT:  Head: Normocephalic and atraumatic.  Mouth/Throat: Oropharynx is clear and moist.  Eyes: Pupils are equal, round, and reactive to light. Conjunctivae and EOM are normal.  Neck: Normal range of motion.  Cardiovascular: Normal rate, regular rhythm and normal heart sounds.  Pulmonary/Chest: Effort normal and breath sounds normal.  Abdominal: Soft. Bowel sounds are normal.  Musculoskeletal: Normal range of motion.  Neurological: He is alert and oriented to person, place, and time.  Skin: Skin is warm and dry.    Psychiatric: He has a normal mood and affect.  Nursing note and vitals reviewed.    ED Treatments / Results  Labs (all labs ordered are listed, but only abnormal results are displayed) Labs Reviewed  CBG MONITORING, ED - Abnormal; Notable for the following components:      Result Value   Glucose-Capillary 278 (*)    All other components within normal limits  I-STAT CHEM 8, ED - Abnormal; Notable for the following components:   BUN 23 (*)    Glucose, Bld 304 (*)    Hemoglobin 8.5 (*)    HCT 25.0 (*)    All other components within normal limits    EKG None  Radiology No results found.  Procedures Procedures (including critical care time)  Medications Ordered in ED Medications - No data  to display   Initial Impression / Assessment and Plan / ED Course  I have reviewed the triage vital signs and the nursing notes.  Pertinent labs & imaging results that were available during my care of the patient were reviewed by me and considered in my medical decision making (see chart for details).  57 year old male here with hyperglycemia.  66 visits in the past 6 months for similar related complaints.  He is afebrile and nontoxic.  He is disheveled but appears at his baseline as I have I evaluated him personally multiple times.  Glucose here 304, however normal bicarb at 29, anion gap normal at 7.  Clinically not DKA.  Patient is to follow-up with PCP and [facility] in the morning for his medications.  He can return here for new/emergent concerns.  Final Clinical Impressions(s) / ED Diagnoses   Final diagnoses:  Hyperglycemia    ED Discharge Orders    None       Larene Pickett, PA-C 10/03/17 0107    Ripley Fraise, MD 10/03/17 [phone]

## 2017-10-04 ENCOUNTER — Other Ambulatory Visit: Payer: Self-pay

## 2017-10-04 ENCOUNTER — Encounter (HOSPITAL_COMMUNITY): Payer: Self-pay | Admitting: Emergency Medicine

## 2017-10-04 ENCOUNTER — Emergency Department (HOSPITAL_COMMUNITY)
Admission: EM | Admit: 2017-10-04 | Discharge: 2017-10-04 | Disposition: A | Discharge status: Home / Self Care | Payer: Self-pay | Attending: Emergency Medicine | Admitting: Emergency Medicine

## 2017-10-04 DIAGNOSIS — E1165 Type 2 diabetes mellitus with hyperglycemia: Secondary | ICD-10-CM | POA: Insufficient documentation

## 2017-10-04 DIAGNOSIS — I10 Essential (primary) hypertension: Secondary | ICD-10-CM | POA: Insufficient documentation

## 2017-10-04 DIAGNOSIS — Z794 Long term (current) use of insulin: Secondary | ICD-10-CM | POA: Insufficient documentation

## 2017-10-04 DIAGNOSIS — R739 Hyperglycemia, unspecified: Secondary | ICD-10-CM

## 2017-10-04 DIAGNOSIS — Z8673 Personal history of transient ischemic attack (TIA), and cerebral infarction without residual deficits: Secondary | ICD-10-CM | POA: Insufficient documentation

## 2017-10-04 DIAGNOSIS — F17210 Nicotine dependence, cigarettes, uncomplicated: Secondary | ICD-10-CM | POA: Insufficient documentation

## 2017-10-04 DIAGNOSIS — E785 Hyperlipidemia, unspecified: Secondary | ICD-10-CM | POA: Insufficient documentation

## 2017-10-04 DIAGNOSIS — Z79899 Other long term (current) drug therapy: Secondary | ICD-10-CM | POA: Insufficient documentation

## 2017-10-04 LAB — CBC WITH DIFFERENTIAL/PLATELET
Basophils Absolute: 0 10*3/uL (ref 0.0–0.1)
Basophils Relative: 0 %
EOS ABS: 0.1 10*3/uL (ref 0.0–0.7)
Eosinophils Relative: 3 %
HEMATOCRIT: 28.4 % — AB (ref 39.0–52.0)
HEMOGLOBIN: 8.9 g/dL — AB (ref 13.0–17.0)
LYMPHS ABS: 1.3 10*3/uL (ref 0.7–4.0)
LYMPHS PCT: 30 %
MCH: 25.4 pg — AB (ref 26.0–34.0)
MCHC: 31.3 g/dL (ref 30.0–36.0)
MCV: 80.9 fL (ref 78.0–100.0)
MONOS PCT: 8 %
Monocytes Absolute: 0.3 10*3/uL (ref 0.1–1.0)
NEUTROS PCT: 59 %
Neutro Abs: 2.5 10*3/uL (ref 1.7–7.7)
Platelets: 271 10*3/uL (ref 150–400)
RBC: 3.51 MIL/uL — AB (ref 4.22–5.81)
RDW: 14.2 % (ref 11.5–15.5)
WBC: 4.2 10*3/uL (ref 4.0–10.5)

## 2017-10-04 LAB — URINALYSIS, ROUTINE W REFLEX MICROSCOPIC
BACTERIA UA: NONE SEEN
Bilirubin Urine: NEGATIVE
Hgb urine dipstick: NEGATIVE
KETONES UR: NEGATIVE mg/dL
Leukocytes, UA: NEGATIVE
Nitrite: NEGATIVE
PH: 5 (ref 5.0–8.0)
Protein, ur: NEGATIVE mg/dL
SPECIFIC GRAVITY, URINE: 1.029 (ref 1.005–1.030)

## 2017-10-04 LAB — COMPREHENSIVE METABOLIC PANEL
ALT: 191 U/L — ABNORMAL HIGH (ref 0–44)
ANION GAP: 9 (ref 5–15)
AST: 70 U/L — ABNORMAL HIGH (ref 15–41)
Albumin: 3.8 g/dL (ref 3.5–5.0)
Alkaline Phosphatase: 139 U/L — ABNORMAL HIGH (ref 38–126)
BUN: 21 mg/dL — ABNORMAL HIGH (ref 6–20)
CHLORIDE: 97 mmol/L — AB (ref 98–111)
CO2: 30 mmol/L (ref 22–32)
Calcium: 9.7 mg/dL (ref 8.9–10.3)
Creatinine, Ser: 0.9 mg/dL (ref 0.61–1.24)
Glucose, Bld: 702 mg/dL (ref 70–99)
Potassium: 4.4 mmol/L (ref 3.5–5.1)
SODIUM: 136 mmol/L (ref 135–145)
Total Bilirubin: 0.4 mg/dL (ref 0.3–1.2)
Total Protein: 7.4 g/dL (ref 6.5–8.1)

## 2017-10-04 LAB — CBG MONITORING, ED: Glucose-Capillary: 441 mg/dL — ABNORMAL HIGH (ref 70–99)

## 2017-10-04 MED ORDER — SODIUM CHLORIDE 0.9 % IV SOLN
1.0000 g | Freq: Once | INTRAVENOUS | Status: AC
  Administered 2017-10-04: 1 g via INTRAVENOUS
  Filled 2017-10-04: qty 10

## 2017-10-04 MED ORDER — INSULIN GLARGINE 100 UNIT/ML ~~LOC~~ SOLN: 22.0000 [IU] | Freq: Every morning | SUBCUTANEOUS | 11 refills | Status: DC

## 2017-10-04 MED ORDER — INSULIN ASPART 100 UNIT/ML ~~LOC~~ SOLN: 8.0000 [IU] | Freq: Three times a day (TID) | SUBCUTANEOUS | 11 refills | Status: DC

## 2017-10-04 MED ORDER — INSULIN ASPART 100 UNIT/ML IV SOLN
10.0000 [IU] | Freq: Once | INTRAVENOUS | Status: AC
  Administered 2017-10-04: 10 [IU] via INTRAVENOUS
  Filled 2017-10-04: qty 0.1

## 2017-10-04 MED ORDER — SODIUM CHLORIDE 0.9 % IV BOLUS
1000.0000 mL | Freq: Once | INTRAVENOUS | Status: AC
  Administered 2017-10-04: 1000 mL via INTRAVENOUS

## 2017-10-04 NOTE — ED Notes (Signed)
Bed: [facility]PT4 Expected date:  Expected time:  Means of arrival:  Comments: 

## 2017-10-04 NOTE — ED Notes (Signed)
I Stat Chem 8 K-6.5 Glucose- >700 RN and provider informed

## 2017-10-04 NOTE — ED Provider Notes (Signed)
Mike [name] Provider Note   CSN: [id number] Arrival date & time: 10/04/17  1830     History   Chief Complaint Chief Complaint  Patient presents with  . Hyperglycemia    HPI Mike [name] is a 57 y.o. male.  HPI Patient states he last took his insulin this morning.  States he is run out.  As blood sugar is high and called EMS.  Complaining of generalized weakness.  Denies focal weakness or numbness.  No fever or chills. Past Medical History:  Diagnosis Date  . Anxiety   . Anxiety   . Chronic lower back pain   . Depression   . DKA (diabetic ketoacidoses) ([place]) 07/30/2016  . Hyperlipemia   . Hypertension   . Migraine    "last one was ~ 4 yr ago" (12/23/2014)  . Seizures ([place])    "related to pills for anxiety; if I don't take the pills I'm suppose to take I'll have them" (12/23/2014)  . Type II diabetes mellitus [facility])     Patient Active Problem List   Diagnosis Date Noted  . 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 Hypernatremia   . Memory difficulties   . Type 2 diabetes mellitus with hyperosmolar nonketotic hyperglycemia ([place]) 08/03/2017  . Hyperglycemia 05/26/2017  . Noncompliance 05/26/2017  . AKI (acute kidney injury) ([place]) 03/04/2017  . Malnutrition of moderate degree 02/08/2017  . DKA (diabetic ketoacidoses) ([place]) 02/07/2017  . History of stroke 02/07/2017  . Hyperphosphatemia 12/13/2016  . Evaluation by psychiatric service required 11/30/2016  . Lactic acidosis 11/18/2016  . Anemia 11/06/2016  . HLD (hyperlipidemia) 11/06/2016  . Adjustment disorder with other symptoms 11/05/2016  . Uncontrolled type 2 diabetes mellitus with hyperosmolar nonketotic hyperglycemia ([place]) 08/24/2016  . Essential hypertension 08/24/2016  . Acute ischemic stroke ([place])   . Protein-calorie malnutrition, severe 12/24/2014  . Homelessness 12/23/2014  . Hypertensive urgency 12/23/2014  . DM (diabetes mellitus) ([place]) 12/23/2014  .  Intermittent palpitations 12/23/2014  . Tobacco abuse 12/23/2014  . Pleuritic chest pain 12/23/2014  . Abnormal EKG 12/23/2014  . Type II diabetes mellitus with renal manifestations [facility])     Past Surgical History:  Procedure Laterality Date  . NO PAST SURGERIES          Home Medications    Prior to Admission medications   Medication Sig Start Date End Date Taking? Authorizing Provider  amLODipine (NORVASC) 10 MG tablet Take 1 tablet (10 mg total) by mouth daily. 05/28/17  Yes Dessa Phi, DO  atorvastatin (LIPITOR) 40 MG tablet Take 1 tablet (40 mg total) by mouth daily. 01/26/17  Yes Cardama, Grayce Sessions, MD  blood glucose meter kit and supplies KIT Dispense based on patient and insurance preference. Use up to four times daily as directed. (FOR ICD-9 250.00, 250.01). 08/16/17  Yes Charlynne Cousins, MD  ferrous sulfate 325 (65 FE) MG tablet Take 1 tablet (325 mg total) daily by mouth. Patient taking differently: Take 325 mg by mouth daily as needed (low iron).  11/23/16  Yes Ward, Delice Bison, DO  Insulin Pen Needle 31G X 6 MM MISC 1 Device by Does not apply route 2 (two) times daily. 08/16/17  Yes Charlynne Cousins, MD  lisinopril (PRINIVIL,ZESTRIL) 40 MG tablet Take 1 tablet (40 mg total) by mouth daily. 04/30/17  Yes Dorie Rank, MD  metoprolol tartrate (LOPRESSOR) 25 MG tablet Take 0.5 tablets (12.5 mg total) by mouth 2 (two) times daily. 03/07/17  Yes Georgette Shell, MD  blood glucose meter kit and supplies KIT Dispense based on patient and insurance preference. Use up to four times daily as directed. (FOR ICD-9 250.00, 250.01). Patient not taking: Reported on 10/04/2017 11/22/16   Velvet Bathe, MD  insulin aspart (NOVOLOG) 100 UNIT/ML injection Inject 8 Units into the skin 3 (three) times daily with meals. 10/04/17   Julianne Rice, MD  insulin glargine (LANTUS) 100 UNIT/ML injection Inject 0.22 mLs (22 Units total) into the skin every morning. 10/04/17   Julianne Rice,  MD    Family History Family History  Problem Relation Age of Onset  . Diabetes Mellitus II Mother   . Diabetes Mellitus II Father     Social History Social History   Tobacco Use  . Smoking status: Current Every Day Smoker    Packs/day: 0.10    Years: 40.00    Pack years: 4.00    Types: Cigarettes  . Smokeless tobacco: Never Used  Substance Use Topics  . Alcohol use: Yes    Alcohol/week: 2.0 standard drinks    Types: 2 Cans of beer per week    Comment: No labs available at time of assessment  . Drug use: No    Comment: 12/23/2014 "stopped ~ 10 yrs ago"     Allergies   Ibuprofen and Tylenol [acetaminophen]   Review of Systems Review of Systems  Constitutional: Positive for fatigue. Negative for chills and fever.  Respiratory: Negative for shortness of breath.   Cardiovascular: Negative for chest pain and leg swelling.  Gastrointestinal: Negative for abdominal pain, constipation, diarrhea, nausea and vomiting.  Musculoskeletal: Negative for back pain, myalgias and neck pain.  Skin: Negative for rash and wound.  Neurological: Positive for weakness. Negative for dizziness, light-headedness, numbness and headaches.  All other systems reviewed and are negative.    Physical Exam Updated Vital Signs BP (!) 178/89   Pulse 67   Temp 99.2 F (37.3 C) (Oral)   Resp 18   Ht '6\' 4"'$  (1.93 m)   Wt 77.1 kg   SpO2 100%   BMI 20.69 kg/m   Physical Exam  Constitutional: He is oriented to person, place, and time. He appears well-developed.  Disheveled.  Foul-smelling.  HENT:  Head: Normocephalic and atraumatic.  Mouth/Throat: Oropharynx is clear and moist. No oropharyngeal exudate.  Eyes: Pupils are equal, round, and reactive to light. EOM are normal.  Neck: Normal range of motion. Neck supple.  Cardiovascular: Normal rate and regular rhythm. Exam reveals no gallop and no friction rub.  No murmur heard. Pulmonary/Chest: Effort normal and breath sounds normal. No stridor.  No respiratory distress. He has no wheezes. He has no rales. He exhibits no tenderness.  Abdominal: Soft. Bowel sounds are normal. There is no tenderness. There is no rebound and no guarding.  Musculoskeletal: Normal range of motion. He exhibits no edema or tenderness.  Neurological: He is alert and oriented to person, place, and time.  Moving all extremities without focal deficit.  Sensation intact.  Skin: Skin is warm and dry. No rash noted. No erythema.  Psychiatric: He has a normal mood and affect. His behavior is normal.  Nursing note and vitals reviewed.    ED Treatments / Results  Labs (all labs ordered are listed, but only abnormal results are displayed) Labs Reviewed  COMPREHENSIVE METABOLIC PANEL - Abnormal; Notable for the following components:      Result Value   Chloride 97 (*)    Glucose, Bld 702 (*)    BUN 21 (*)  AST 70 (*)    ALT 191 (*)    Alkaline Phosphatase 139 (*)    All other components within normal limits  CBC WITH DIFFERENTIAL/PLATELET - Abnormal; Notable for the following components:   RBC 3.51 (*)    Hemoglobin 8.9 (*)    HCT 28.4 (*)    MCH 25.4 (*)    All other components within normal limits  URINALYSIS, ROUTINE W REFLEX MICROSCOPIC - Abnormal; Notable for the following components:   Color, Urine STRAW (*)    Glucose, UA >=500 (*)    All other components within normal limits  I-STAT CHEM 8, ED - Abnormal; Notable for the following components:   Sodium 130 (*)    Potassium 6.5 (*)    Chloride 94 (*)    BUN 28 (*)    Glucose, Bld >700 (*)    Calcium, Ion 1.13 (*)    Hemoglobin 9.9 (*)    HCT 29.0 (*)    All other components within normal limits  CBG MONITORING, ED - Abnormal; Notable for the following components:   Glucose-Capillary 441 (*)    All other components within normal limits    EKG EKG Interpretation  Date/Time:  Thursday October 04 2017 19:35:56 EDT Ventricular Rate:  69 PR Interval:    QRS Duration: 93 QT  Interval:  432 QTC Calculation: 463 R Axis:   48 Text Interpretation:  Sinus rhythm Atrial premature complex Short PR interval Probable left atrial enlargement Probable anteroseptal infarct, old Confirmed by Julianne Rice [phone]) on 10/04/2017 10:13:25 PM   Radiology No results found.  Procedures Procedures (including critical care time)  Medications Ordered in ED Medications  sodium chloride 0.9 % bolus 1,000 mL ( Intravenous Stopped 10/04/17 2140)  insulin aspart (novoLOG) injection 10 Units (10 Units Intravenous Given 10/04/17 2053)  calcium gluconate 1 g in sodium chloride 0.9 % 100 mL IVPB ( Intravenous Stopped 10/04/17 2036)     Initial Impression / Assessment and Plan / ED Course  I have reviewed the triage vital signs and the nursing notes.  Pertinent labs & imaging results that were available during my care of the patient were reviewed by me and considered in my medical decision making (see chart for details).     I-STAT Chem-8 with elevation in potassium.  Repeat CMP with normal potassium.  Likely elevation was due to hemolysis.  Blood sugar initially over 700 improved to low 400 with IV fluids and insulin.  Will refill patient's insulin prescriptions.  Advised to follow-up with the health and wellness clinic or primary physician. Final Clinical Impressions(s) / ED Diagnoses   Final diagnoses:  Hyperglycemia    ED Discharge Orders         Ordered    insulin aspart (NOVOLOG) 100 UNIT/ML injection  3 times daily with meals     10/04/17 2251    insulin glargine (LANTUS) 100 UNIT/ML injection   Every morning - 10a     10/04/17 2251           Julianne Rice, MD 10/04/17 2252

## 2017-10-04 NOTE — ED Triage Notes (Signed)
[place] [facility] [place] stand[place]. Pt coming in for hyperglycemia and weakness. CBG high. Vitals: 184/90, 70HR, 18R.

## 2017-10-04 NOTE — ED Notes (Signed)
Pt is not willing to elaborate on condition. When not bothered pt will fall asleep. No emesis present.

## 2017-10-04 NOTE — ED Notes (Addendum)
Pt refused to sign for discharge. Security was called to escort the pt off the premises. Pt was uncooperative with leaving. Pt was able to ambulate with a steady gate.

## 2017-10-05 ENCOUNTER — Other Ambulatory Visit: Payer: Self-pay

## 2017-10-05 ENCOUNTER — Encounter (HOSPITAL_COMMUNITY): Payer: Self-pay | Admitting: Emergency Medicine

## 2017-10-05 ENCOUNTER — Emergency Department (HOSPITAL_COMMUNITY)
Admission: EM | Admit: 2017-10-05 | Discharge: 2017-10-06 | Disposition: A | Discharge status: Home / Self Care | Payer: Self-pay | Attending: Emergency Medicine | Admitting: Emergency Medicine

## 2017-10-05 DIAGNOSIS — Z79899 Other long term (current) drug therapy: Secondary | ICD-10-CM | POA: Insufficient documentation

## 2017-10-05 DIAGNOSIS — Z794 Long term (current) use of insulin: Secondary | ICD-10-CM | POA: Insufficient documentation

## 2017-10-05 DIAGNOSIS — E119 Type 2 diabetes mellitus without complications: Secondary | ICD-10-CM | POA: Insufficient documentation

## 2017-10-05 DIAGNOSIS — I10 Essential (primary) hypertension: Secondary | ICD-10-CM | POA: Insufficient documentation

## 2017-10-05 DIAGNOSIS — F17210 Nicotine dependence, cigarettes, uncomplicated: Secondary | ICD-10-CM | POA: Insufficient documentation

## 2017-10-05 DIAGNOSIS — R739 Hyperglycemia, unspecified: Secondary | ICD-10-CM | POA: Insufficient documentation

## 2017-10-05 LAB — I-STAT CHEM 8, ED
BUN: 28 mg/dL — AB (ref 6–20)
BUN: 18 mg/dL (ref 6–20)
CHLORIDE: 94 mmol/L — AB (ref 98–111)
CREATININE: 0.8 mg/dL (ref 0.61–1.24)
CREATININE: 0.8 mg/dL (ref 0.61–1.24)
Calcium, Ion: 1.13 mmol/L — ABNORMAL LOW (ref 1.15–1.40)
Calcium, Ion: 1.25 mmol/L (ref 1.15–1.40)
Chloride: 95 mmol/L — ABNORMAL LOW (ref 98–111)
GLUCOSE: 630 mg/dL — AB (ref 70–99)
Glucose, Bld: >700 mg/dL (ref 70–99)
HCT: 29 % — ABNORMAL LOW (ref 39.0–52.0)
HEMATOCRIT: 28 % — AB (ref 39.0–52.0)
HEMOGLOBIN: 9.9 g/dL — AB (ref 13.0–17.0)
Hemoglobin: 9.5 g/dL — ABNORMAL LOW (ref 13.0–17.0)
POTASSIUM: 6.5 mmol/L — AB (ref 3.5–5.1)
Potassium: 3.9 mmol/L (ref 3.5–5.1)
Sodium: 130 mmol/L — ABNORMAL LOW (ref 135–145)
Sodium: 134 mmol/L — ABNORMAL LOW (ref 135–145)
TCO2: 30 mmol/L (ref 22–32)
TCO2: 27 mmol/L (ref 22–32)

## 2017-10-05 MED ORDER — INSULIN ASPART 100 UNIT/ML ~~LOC~~ SOLN
10.0000 [IU] | Freq: Once | SUBCUTANEOUS | Status: AC
  Administered 2017-10-05: 10 [IU] via SUBCUTANEOUS
  Filled 2017-10-05: qty 1

## 2017-10-05 NOTE — ED Notes (Signed)
Bed: [facility]PT4 Expected date:  Expected time:  Means of arrival:  Comments: 

## 2017-10-05 NOTE — ED Provider Notes (Signed)
[facility] DEPT Provider Note   CSN: [id number] Arrival date & time: 10/05/17  2008     History   Chief Complaint Chief Complaint  Patient presents with  . Hyperglycemia    HPI Mike [name] is a 57 y.o. male.  Mike [name] is a 57 y.o. Male history as detailed below, who is seen frequently in the emergency department for hyperglycemia, presents via EMS for evaluation of elevated blood sugar.  Per EMS sugar read as high on their meter.  Patient reports he is out of his insulin, he was last seen in the ED yesterday.  He is not complaining of any focal pain, denies fevers or chills, no chest pain, shortness of breath or abdominal pain.  Alert and responsive.     Past Medical History:  Diagnosis Date  . Anxiety   . Anxiety   . Chronic lower back pain   . Depression   . DKA (diabetic ketoacidoses) ([place]) 07/30/2016  . Hyperlipemia   . Hypertension   . Migraine    "last one was ~ 4 yr ago" (12/23/2014)  . Seizures ([place])    "related to pills for anxiety; if I don't take the pills I'm suppose to take I'll have them" (12/23/2014)  . Type II diabetes mellitus [facility])     Patient Active Problem List   Diagnosis Date Noted  . 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 Hypernatremia   . Memory difficulties   . Type 2 diabetes mellitus with hyperosmolar nonketotic hyperglycemia ([place]) 08/03/2017  . Hyperglycemia 05/26/2017  . Noncompliance 05/26/2017  . AKI (acute kidney injury) ([place]) 03/04/2017  . Malnutrition of moderate degree 02/08/2017  . DKA (diabetic ketoacidoses) ([place]) 02/07/2017  . History of stroke 02/07/2017  . Hyperphosphatemia 12/13/2016  . Evaluation by psychiatric service required 11/30/2016  . Lactic acidosis 11/18/2016  . Anemia 11/06/2016  . HLD (hyperlipidemia) 11/06/2016  . Adjustment disorder with other symptoms 11/05/2016  . Uncontrolled type 2 diabetes mellitus with hyperosmolar nonketotic hyperglycemia ([place])  08/24/2016  . Essential hypertension 08/24/2016  . Acute ischemic stroke ([place])   . Protein-calorie malnutrition, severe 12/24/2014  . Homelessness 12/23/2014  . Hypertensive urgency 12/23/2014  . DM (diabetes mellitus) ([place]) 12/23/2014  . Intermittent palpitations 12/23/2014  . Tobacco abuse 12/23/2014  . Pleuritic chest pain 12/23/2014  . Abnormal EKG 12/23/2014  . Type II diabetes mellitus with renal manifestations [facility])     Past Surgical History:  Procedure Laterality Date  . NO PAST SURGERIES          Home Medications    Prior to Admission medications   Medication Sig Start Date End Date Taking? Authorizing Provider  amLODipine (NORVASC) 10 MG tablet Take 1 tablet (10 mg total) by mouth daily. 05/28/17   Dessa Phi, DO  atorvastatin (LIPITOR) 40 MG tablet Take 1 tablet (40 mg total) by mouth daily. 01/26/17   Cardama, Grayce Sessions, MD  blood glucose meter kit and supplies KIT Dispense based on patient and insurance preference. Use up to four times daily as directed. (FOR ICD-9 250.00, 250.01). Patient not taking: Reported on 10/04/2017 11/22/16   Velvet Bathe, MD  blood glucose meter kit and supplies KIT Dispense based on patient and insurance preference. Use up to four times daily as directed. (FOR ICD-9 250.00, 250.01). 08/16/17   Charlynne Cousins, MD  ferrous sulfate 325 (65 FE) MG tablet Take 1 tablet (325 mg total) daily by mouth. Patient taking differently: Take 325 mg by mouth daily as  needed (low iron).  11/23/16   Ward, Delice Bison, DO  insulin aspart (NOVOLOG) 100 UNIT/ML injection Inject 8 Units into the skin 3 (three) times daily with meals. 10/04/17   Julianne Rice, MD  insulin glargine (LANTUS) 100 UNIT/ML injection Inject 0.22 mLs (22 Units total) into the skin every morning. 10/04/17   Julianne Rice, MD  Insulin Pen Needle 31G X 6 MM MISC 1 Device by Does not apply route 2 (two) times daily. 08/16/17   Charlynne Cousins, MD  lisinopril (PRINIVIL,ZESTRIL)  40 MG tablet Take 1 tablet (40 mg total) by mouth daily. 04/30/17   Dorie Rank, MD  metoprolol tartrate (LOPRESSOR) 25 MG tablet Take 0.5 tablets (12.5 mg total) by mouth 2 (two) times daily. 03/07/17   Georgette Shell, MD    Family History Family History  Problem Relation Age of Onset  . Diabetes Mellitus II Mother   . Diabetes Mellitus II Father     Social History Social History   Tobacco Use  . Smoking status: Current Every Day Smoker    Packs/day: 0.10    Years: 40.00    Pack years: 4.00    Types: Cigarettes  . Smokeless tobacco: Never Used  Substance Use Topics  . Alcohol use: Yes    Alcohol/week: 2.0 standard drinks    Types: 2 Cans of beer per week    Comment: No labs available at time of assessment  . Drug use: No    Comment: 12/23/2014 "stopped ~ 10 yrs ago"     Allergies   Ibuprofen and Tylenol [acetaminophen]   Review of Systems Review of Systems  Constitutional: Negative for chills and fever.  Respiratory: Negative for shortness of breath.   Cardiovascular: Negative for chest pain.  Gastrointestinal: Negative for abdominal pain, nausea and vomiting.  Skin: Negative for color change and rash.  Neurological: Negative for syncope.  All other systems reviewed and are negative.    Physical Exam Updated Vital Signs BP (!) 152/91 (BP Location: Right Arm)   Pulse 74   Temp 99.3 F (37.4 C) (Oral)   Resp 16   SpO2 99%   Physical Exam  Constitutional: He appears well-developed and well-nourished. No distress.  Patient is malodorous and appears disheveled, but is alert and responsive, cooperative with exam  HENT:  Head: Normocephalic and atraumatic.  Mouth/Throat: Oropharynx is clear and moist.  Eyes: Right eye exhibits no discharge. Left eye exhibits no discharge.  Cardiovascular: Normal rate, regular rhythm, normal heart sounds and intact distal pulses.  Pulmonary/Chest: Effort normal and breath sounds normal. No respiratory distress.    Respirations equal and unlabored, patient able to speak in full sentences, lungs clear to auscultation bilaterally  Abdominal: Soft. Bowel sounds are normal. He exhibits no distension and no mass. There is no tenderness. There is no guarding.  Abdomen soft, nondistended, nontender to palpation in all quadrants without guarding or peritoneal signs  Musculoskeletal: He exhibits no deformity.  Neurological: He is alert. Coordination normal.  Alert and responsive, following commands. Moving all extremities without difficulty.  Skin: Skin is warm and dry. He is not diaphoretic.  Psychiatric: He has a normal mood and affect. His behavior is normal.  Nursing note and vitals reviewed.    ED Treatments / Results  Labs (all labs ordered are listed, but only abnormal results are displayed) Labs Reviewed  I-STAT CHEM 8, ED - Abnormal; Notable for the following components:      Result Value   Sodium 134 (*)  Chloride 95 (*)    Glucose, Bld 630 (*)    Hemoglobin 9.5 (*)    HCT 28.0 (*)    All other components within normal limits    EKG None  Radiology No results found.  Procedures Procedures (including critical care time)  Medications Ordered in ED Medications  insulin aspart (novoLOG) injection 10 Units (has no administration in time range)     Initial Impression / Assessment and Plan / ED Course  I have reviewed the triage vital signs and the nursing notes.  Pertinent labs & imaging results that were available during my care of the patient were reviewed by me and considered in my medical decision making (see chart for details).  Presents for hyperglycemia was last seen yesterday.  Glucose is 630 here today but anion gap is 12 patient does not appear to be in DKA.  Will give 10 units subcu insulin and discharge, patient has been encouraged to follow-up with the [facility] for refills of his medications and further diabetes management.  Return precautions discussed.  Final Clinical  Impressions(s) / ED Diagnoses   Final diagnoses:  Hyperglycemia    ED Discharge Orders    None       Janet Berlin 10/05/17 2306    Malvin Johns, MD 10/06/17 [phone]

## 2017-10-05 NOTE — ED Triage Notes (Signed)
Pt BIB EMS for hyperglycemia. Machine read "HI" for EMS.

## 2017-10-05 NOTE — Discharge Instructions (Signed)
[facility] for continued management of your diabetes.  You are not in DKA today.  Take your insulin as directed.

## 2017-10-06 ENCOUNTER — Encounter (HOSPITAL_COMMUNITY): Payer: Self-pay | Admitting: *Deleted

## 2017-10-06 ENCOUNTER — Emergency Department (HOSPITAL_COMMUNITY)
Admission: EM | Admit: 2017-10-06 | Discharge: 2017-10-06 | Disposition: A | Discharge status: Home / Self Care | Payer: Self-pay | Attending: Emergency Medicine | Admitting: Emergency Medicine

## 2017-10-06 DIAGNOSIS — F17210 Nicotine dependence, cigarettes, uncomplicated: Secondary | ICD-10-CM | POA: Insufficient documentation

## 2017-10-06 DIAGNOSIS — Z79899 Other long term (current) drug therapy: Secondary | ICD-10-CM | POA: Insufficient documentation

## 2017-10-06 DIAGNOSIS — Z8673 Personal history of transient ischemic attack (TIA), and cerebral infarction without residual deficits: Secondary | ICD-10-CM | POA: Insufficient documentation

## 2017-10-06 DIAGNOSIS — Z794 Long term (current) use of insulin: Secondary | ICD-10-CM | POA: Insufficient documentation

## 2017-10-06 DIAGNOSIS — I10 Essential (primary) hypertension: Secondary | ICD-10-CM | POA: Insufficient documentation

## 2017-10-06 DIAGNOSIS — R739 Hyperglycemia, unspecified: Secondary | ICD-10-CM

## 2017-10-06 DIAGNOSIS — E785 Hyperlipidemia, unspecified: Secondary | ICD-10-CM | POA: Insufficient documentation

## 2017-10-06 DIAGNOSIS — E1165 Type 2 diabetes mellitus with hyperglycemia: Secondary | ICD-10-CM | POA: Insufficient documentation

## 2017-10-06 LAB — CBG MONITORING, ED: GLUCOSE-CAPILLARY: 191 mg/dL — AB (ref 70–99)

## 2017-10-06 LAB — I-STAT CHEM 8, ED
BUN: 19 mg/dL (ref 6–20)
CALCIUM ION: 1.25 mmol/L (ref 1.15–1.40)
Chloride: 98 mmol/L (ref 98–111)
Creatinine, Ser: 0.9 mg/dL (ref 0.61–1.24)
GLUCOSE: 459 mg/dL — AB (ref 70–99)
HCT: 25 % — ABNORMAL LOW (ref 39.0–52.0)
Hemoglobin: 8.5 g/dL — ABNORMAL LOW (ref 13.0–17.0)
Potassium: 3.7 mmol/L (ref 3.5–5.1)
Sodium: 135 mmol/L (ref 135–145)
TCO2: 28 mmol/L (ref 22–32)

## 2017-10-06 MED ORDER — INSULIN ASPART 100 UNIT/ML ~~LOC~~ SOLN
10.0000 [IU] | Freq: Once | SUBCUTANEOUS | Status: AC
  Administered 2017-10-06: 10 [IU] via SUBCUTANEOUS
  Filled 2017-10-06: qty 1

## 2017-10-06 NOTE — ED Provider Notes (Signed)
[facility] DEPT Provider Note   CSN: [id number] Arrival date & time: 10/06/17  1901     History   Chief Complaint Chief Complaint  Patient presents with  . Hyperglycemia    HPI Vadhir Hartshorn is a 57 y.o. male with PMH significant for diabetes requiring insulin, noncompliance, homelessness, frequent ER visits is brought to the ER by EMS after patient was found [facility] the ground at [employer].  CBG by EMS 534.  VS WNL on arrival.  Patient is awake, cooperative.  States he is here for high blood sugar.  He does not take any medicines daily.  He denies recent EtOH intake.  He denies any headache, chest pain, abdominal pain.  HPI  Past Medical History:  Diagnosis Date  . Anxiety   . Anxiety   . Chronic lower back pain   . Depression   . DKA (diabetic ketoacidoses) ([place]) 07/30/2016  . Hyperlipemia   . Hypertension   . Migraine    "last one was ~ 4 yr ago" (12/23/2014)  . Seizures ([place])    "related to pills for anxiety; if I don't take the pills I'm suppose to take I'll have them" (12/23/2014)  . Type II diabetes mellitus [facility])     Patient Active Problem List   Diagnosis Date Noted  . 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 Hypernatremia   . Memory difficulties   . Type 2 diabetes mellitus with hyperosmolar nonketotic hyperglycemia ([place]) 08/03/2017  . Hyperglycemia 05/26/2017  . Noncompliance 05/26/2017  . AKI (acute kidney injury) ([place]) 03/04/2017  . Malnutrition of moderate degree 02/08/2017  . DKA (diabetic ketoacidoses) ([place]) 02/07/2017  . History of stroke 02/07/2017  . Hyperphosphatemia 12/13/2016  . Evaluation by psychiatric service required 11/30/2016  . Lactic acidosis 11/18/2016  . Anemia 11/06/2016  . HLD (hyperlipidemia) 11/06/2016  . Adjustment disorder with other symptoms 11/05/2016  . Uncontrolled type 2 diabetes mellitus with hyperosmolar nonketotic hyperglycemia ([place]) 08/24/2016  . Essential hypertension  08/24/2016  . Acute ischemic stroke ([place])   . Protein-calorie malnutrition, severe 12/24/2014  . Homelessness 12/23/2014  . Hypertensive urgency 12/23/2014  . DM (diabetes mellitus) ([place]) 12/23/2014  . Intermittent palpitations 12/23/2014  . Tobacco abuse 12/23/2014  . Pleuritic chest pain 12/23/2014  . Abnormal EKG 12/23/2014  . Type II diabetes mellitus with renal manifestations [facility])     Past Surgical History:  Procedure Laterality Date  . NO PAST SURGERIES          Home Medications    Prior to Admission medications   Medication Sig Start Date End Date Taking? Authorizing Provider  amLODipine (NORVASC) 10 MG tablet Take 1 tablet (10 mg total) by mouth daily. 05/28/17  Yes Dessa Phi, DO  atorvastatin (LIPITOR) 40 MG tablet Take 1 tablet (40 mg total) by mouth daily. 01/26/17  Yes Cardama, Grayce Sessions, MD  blood glucose meter kit and supplies KIT Dispense based on patient and insurance preference. Use up to four times daily as directed. (FOR ICD-9 250.00, 250.01). 11/22/16  Yes Velvet Bathe, MD  blood glucose meter kit and supplies KIT Dispense based on patient and insurance preference. Use up to four times daily as directed. (FOR ICD-9 250.00, 250.01). 08/16/17  Yes Charlynne Cousins, MD  ferrous sulfate 325 (65 FE) MG tablet Take 1 tablet (325 mg total) daily by mouth. Patient taking differently: Take 325 mg by mouth daily as needed (low iron).  11/23/16  Yes Ward, Cyril Mourning N, DO  insulin aspart (NOVOLOG)  100 UNIT/ML injection Inject 8 Units into the skin 3 (three) times daily with meals. 10/04/17  Yes Julianne Rice, MD  insulin glargine (LANTUS) 100 UNIT/ML injection Inject 0.22 mLs (22 Units total) into the skin every morning. 10/04/17  Yes Julianne Rice, MD  Insulin Pen Needle 31G X 6 MM MISC 1 Device by Does not apply route 2 (two) times daily. 08/16/17  Yes Charlynne Cousins, MD  lisinopril (PRINIVIL,ZESTRIL) 40 MG tablet Take 1 tablet (40 mg total) by mouth daily.  04/30/17  Yes Dorie Rank, MD  metoprolol tartrate (LOPRESSOR) 25 MG tablet Take 0.5 tablets (12.5 mg total) by mouth 2 (two) times daily. 03/07/17  Yes Georgette Shell, MD    Family History Family History  Problem Relation Age of Onset  . Diabetes Mellitus II Mother   . Diabetes Mellitus II Father     Social History Social History   Tobacco Use  . Smoking status: Current Every Day Smoker    Packs/day: 0.10    Years: 40.00    Pack years: 4.00    Types: Cigarettes  . Smokeless tobacco: Never Used  Substance Use Topics  . Alcohol use: Yes    Alcohol/week: 2.0 standard drinks    Types: 2 Cans of beer per week    Comment: No labs available at time of assessment  . Drug use: No    Comment: 12/23/2014 "stopped ~ 10 yrs ago"     Allergies   Ibuprofen and Tylenol [acetaminophen]   Review of Systems Review of Systems  All other systems reviewed and are negative.    Physical Exam Updated Vital Signs BP (!) 184/76   Pulse (!) 57   Resp 16   SpO2 99%   Physical Exam  Constitutional: He appears well-developed and well-nourished. No distress.  No acute distress.  Laying in hall bed.  Awake.  Cooperative.  Strong body odor.  HENT:  Head: Normocephalic and atraumatic.  Right Ear: External ear normal.  Left Ear: External ear normal.  Nose: Nose normal.  Poor dentition with multiple missing teeth.  Moist mucous membranes.  Eyes: Conjunctivae and EOM are normal.  Neck: Normal range of motion. Neck supple.  Cardiovascular: Normal rate, regular rhythm and normal heart sounds.  2+ radial and DP pulses bilaterally.  Trace pitting edema to distal tib-fib bilaterally.  No calf tenderness.  Pulmonary/Chest: Effort normal and breath sounds normal.  Abdominal: Soft. There is no tenderness.  Musculoskeletal: Normal range of motion.  Neurological: He is alert.  Spontaneously moves all 4 extremities in coordinated symmetric fashion.  He can sit up on the side of the bed without any  trunk sway. Speech is fluent without obvious dysarthria or dysphasia.  Skin: Skin is warm and dry. Capillary refill takes less than 2 seconds.  Psychiatric: His behavior is normal.  Nursing note and vitals reviewed.    ED Treatments / Results  Labs (all labs ordered are listed, but only abnormal results are displayed) Labs Reviewed  I-STAT CHEM 8, ED - Abnormal; Notable for the following components:      Result Value   Glucose, Bld 459 (*)    Hemoglobin 8.5 (*)    HCT 25.0 (*)    All other components within normal limits    EKG None  Radiology No results found.  Procedures Procedures (including critical care time)  Medications Ordered in ED Medications  insulin aspart (novoLOG) injection 10 Units (10 Units Subcutaneous Given 10/06/17 2120)   Initial Impression / Assessment  and Plan / ED Course  I have reviewed the triage vital signs and the nursing notes.  Pertinent labs & imaging results that were available during my care of the patient were reviewed by me and considered in my medical decision making (see chart for details).    57 year old male with history of diabetes, frequent ER visits for hyperglycemia is here for hyperglycemia. CBG 534 > 459 here with AG = 9.  K normal. Pt is alert and mentating well, in NAD.  Pt given insulin SQ and oral fluids.  No signs of DKA/HHS.  Lab work most consistent with uncomplicated hyperglycemia. Plan is to discharge with strict ED return precautions, f/u with [facility] as outlined by pt's care plan.    Final Clinical Impressions(s) / ED Diagnoses   Final diagnoses:  Hyperglycemia    ED Discharge Orders    None       Arlean Hopping 10/06/17 2209    Drenda Freeze, MD 10/06/17 [phone]

## 2017-10-06 NOTE — ED Triage Notes (Signed)
Per EMS, [facility] [employer], pt was laying in the main entrance. Pt had CBG of 534.   BP 150/78 HR 86 RR 16

## 2017-10-06 NOTE — Discharge Instructions (Signed)
[facility]  Return to the ER for increased thirst, excessive urination, abdominal pain, vomiting, fever, productive cough, chest pain or shortness of breath with exertion.

## 2017-10-07 ENCOUNTER — Emergency Department (HOSPITAL_COMMUNITY)
Admission: EM | Admit: 2017-10-07 | Discharge: 2017-10-07 | Discharge status: Against Medical Advice | Payer: Self-pay | Attending: Emergency Medicine | Admitting: Emergency Medicine

## 2017-10-07 ENCOUNTER — Encounter (HOSPITAL_COMMUNITY): Payer: Self-pay | Admitting: Nurse Practitioner

## 2017-10-07 DIAGNOSIS — Z5321 Procedure and treatment not carried out due to patient leaving prior to being seen by health care provider: Secondary | ICD-10-CM | POA: Insufficient documentation

## 2017-10-07 NOTE — ED Triage Notes (Signed)
Per EMS-EMS was called by a restaurant at pt request. Pt is concerned that his blood sugar is high. Per EMS- CBG-480.  Pt has fecal incontinence. Pt is alert, and ambulatory.

## 2017-10-07 NOTE — ED Triage Notes (Signed)
Pt state his blood sugar is high, has no other complaints.

## 2017-10-08 ENCOUNTER — Encounter (HOSPITAL_COMMUNITY): Payer: Self-pay | Admitting: Emergency Medicine

## 2017-10-08 ENCOUNTER — Other Ambulatory Visit: Payer: Self-pay

## 2017-10-08 ENCOUNTER — Emergency Department (HOSPITAL_COMMUNITY)
Admission: EM | Admit: 2017-10-08 | Discharge: 2017-10-08 | Disposition: A | Discharge status: Home / Self Care | Payer: Self-pay | Attending: Emergency Medicine | Admitting: Emergency Medicine

## 2017-10-08 DIAGNOSIS — F17210 Nicotine dependence, cigarettes, uncomplicated: Secondary | ICD-10-CM | POA: Insufficient documentation

## 2017-10-08 DIAGNOSIS — E1110 Type 2 diabetes mellitus with ketoacidosis without coma: Secondary | ICD-10-CM | POA: Insufficient documentation

## 2017-10-08 DIAGNOSIS — Z9119 Patient's noncompliance with other medical treatment and regimen: Secondary | ICD-10-CM | POA: Insufficient documentation

## 2017-10-08 DIAGNOSIS — Z794 Long term (current) use of insulin: Secondary | ICD-10-CM | POA: Insufficient documentation

## 2017-10-08 DIAGNOSIS — E1165 Type 2 diabetes mellitus with hyperglycemia: Secondary | ICD-10-CM | POA: Insufficient documentation

## 2017-10-08 DIAGNOSIS — I10 Essential (primary) hypertension: Secondary | ICD-10-CM | POA: Insufficient documentation

## 2017-10-08 DIAGNOSIS — Z79899 Other long term (current) drug therapy: Secondary | ICD-10-CM | POA: Insufficient documentation

## 2017-10-08 DIAGNOSIS — Z590 Homelessness: Secondary | ICD-10-CM | POA: Insufficient documentation

## 2017-10-08 DIAGNOSIS — R739 Hyperglycemia, unspecified: Secondary | ICD-10-CM

## 2017-10-08 LAB — I-STAT CHEM 8, ED
BUN: 19 mg/dL (ref 6–20)
BUN: 24 mg/dL — ABNORMAL HIGH (ref 6–20)
CALCIUM ION: 1.23 mmol/L (ref 1.15–1.40)
CALCIUM ION: 1.19 mmol/L (ref 1.15–1.40)
Chloride: 98 mmol/L (ref 98–111)
Chloride: 95 mmol/L — ABNORMAL LOW (ref 98–111)
Creatinine, Ser: 0.8 mg/dL (ref 0.61–1.24)
Creatinine, Ser: 0.9 mg/dL (ref 0.61–1.24)
Glucose, Bld: 430 mg/dL — ABNORMAL HIGH (ref 70–99)
Glucose, Bld: 470 mg/dL — ABNORMAL HIGH (ref 70–99)
HCT: 27 % — ABNORMAL LOW (ref 39.0–52.0)
HEMATOCRIT: 33 % — AB (ref 39.0–52.0)
HEMOGLOBIN: 11.2 g/dL — AB (ref 13.0–17.0)
HEMOGLOBIN: 9.2 g/dL — AB (ref 13.0–17.0)
Potassium: 4.8 mmol/L (ref 3.5–5.1)
Potassium: 4 mmol/L (ref 3.5–5.1)
SODIUM: 136 mmol/L (ref 135–145)
SODIUM: 132 mmol/L — AB (ref 135–145)
TCO2: 31 mmol/L (ref 22–32)
TCO2: 28 mmol/L (ref 22–32)

## 2017-10-08 LAB — CBG MONITORING, ED
GLUCOSE-CAPILLARY: 394 mg/dL — AB (ref 70–99)
GLUCOSE-CAPILLARY: 460 mg/dL — AB (ref 70–99)

## 2017-10-08 MED ORDER — INSULIN ASPART 100 UNIT/ML ~~LOC~~ SOLN
8.0000 [IU] | Freq: Once | SUBCUTANEOUS | Status: AC
  Administered 2017-10-09: 8 [IU] via SUBCUTANEOUS
  Filled 2017-10-08: qty 1

## 2017-10-08 MED ORDER — INSULIN ASPART 100 UNIT/ML ~~LOC~~ SOLN
10.0000 [IU] | Freq: Once | SUBCUTANEOUS | Status: AC
  Administered 2017-10-08: 10 [IU] via SUBCUTANEOUS
  Filled 2017-10-08: qty 1

## 2017-10-08 NOTE — ED Triage Notes (Signed)
Per [facility] pt from street for high blood sugar. Pt has [employer] and chick fila cups in hands when came in.

## 2017-10-08 NOTE — ED Triage Notes (Signed)
Pt arriving via [facility] for hyperglycemia. Pt seen less than 24 hours ago for same.

## 2017-10-08 NOTE — ED Notes (Signed)
Patient given discharge teaching and verbalized understanding. Patient ambulated out of ED with a steady gait. 

## 2017-10-08 NOTE — ED Notes (Signed)
This Clinical research associatewriter brought patient breakfast d/t not having any Malaw[place]urkey sandwiches. Patient given scrambled eggs with cheese, 2 pieces of bacon, 2 pieces of sausage. Patient given water. Patient very appreciative. Food given with Dr Brandy HaleNanavati's permission

## 2017-10-08 NOTE — ED Provider Notes (Addendum)
[facility]-EMERGENCY DEPT Provider Note   CSN: [id number] Arrival date & time: 10/08/17  0256     History   Chief Complaint Chief Complaint  Patient presents with  . Hyperglycemia    HPI Johneric Romulus is a 57 y.o. male.  HPI  57 y.o. male with PMH significant for diabetes requiring insulin, noncompliance, homelessness comes in via EMS with cc of elevated blood sugar.  Level 5 caveat, patient is a poor historian.  Patient has no complaints besides stating that he is hungry.  He denies any headaches, chest pain, shortness of breath, abdominal pain.  Past Medical History:  Diagnosis Date  . Anxiety   . Anxiety   . Chronic lower back pain   . Depression   . DKA (diabetic ketoacidoses) ([facility]) 07/30/2016  . Hyperlipemia   . Hypertension   . Migraine    "last one was ~ 4 yr ago" (12/23/2014)  . Seizures ([facility])    "related to pills for anxiety; if I don't take the pills I'm suppose to take I'll have them" (12/23/2014)  . Type II diabetes mellitus ([facility])     Patient Active Problem List   Diagnosis Date Noted  . Brittle diabetes mellitus ([facility]) 08/15/2017  . Hypernatremia   . Memory difficulties   . Type 2 diabetes mellitus with hyperosmolar nonketotic hyperglycemia ([facility]) 08/03/2017  . Hyperglycemia 05/26/2017  . Noncompliance 05/26/2017  . AKI (acute kidney injury) ([facility]) 03/04/2017  . Malnutrition of moderate degree 02/08/2017  . DKA (diabetic ketoacidoses) ([facility]) 02/07/2017  . History of stroke 02/07/2017  . Hyperphosphatemia 12/13/2016  . Evaluation by psychiatric service required 11/30/2016  . Lactic acidosis 11/18/2016  . Anemia 11/06/2016  . HLD (hyperlipidemia) 11/06/2016  . Adjustment disorder with other symptoms 11/05/2016  . Uncontrolled type 2 diabetes mellitus with hyperosmolar nonketotic hyperglycemia ([facility]) 08/24/2016  . Essential hypertension 08/24/2016  . Acute ischemic stroke ([facility])   . Protein-calorie malnutrition, severe  12/24/2014  . Homelessness 12/23/2014  . Hypertensive urgency 12/23/2014  . DM (diabetes mellitus) ([facility]) 12/23/2014  . Intermittent palpitations 12/23/2014  . Tobacco abuse 12/23/2014  . Pleuritic chest pain 12/23/2014  . Abnormal EKG 12/23/2014  . Type II diabetes mellitus with renal manifestations ([facility])     Past Surgical History:  Procedure Laterality Date  . NO PAST SURGERIES          Home Medications    Prior to Admission medications   Medication Sig Start Date End Date Taking? Authorizing Provider  amLODipine (NORVASC) 10 MG tablet Take 1 tablet (10 mg total) by mouth daily. 05/28/17   Choi, Jennifer, DO  atorvastatin (LIPITOR) 40 MG tablet Take 1 tablet (40 mg total) by mouth daily. 01/26/17   Cardama, Pedro Eduardo, MD  blood glucose meter kit and supplies KIT Dispense based on patient and insurance preference. Use up to four times daily as directed. (FOR ICD-9 250.00, 250.01). 11/22/16   Vega, Orlando, MD  blood glucose meter kit and supplies KIT Dispense based on patient and insurance preference. Use up to four times daily as directed. (FOR ICD-9 250.00, 250.01). 08/16/17   Feliz Ortiz, Abraham, MD  ferrous sulfate 325 (65 FE) MG tablet Take 1 tablet (325 mg total) daily by mouth. Patient taking differently: Take 325 mg by mouth daily as needed (low iron).  11/23/16   Ward, Kristen N, DO  insulin aspart (NOVOLOG) 100 UNIT/ML injection Inject 8 Units into the skin 3 (three) times daily with meals. 10/04/17   Yelverton, David, MD    insulin glargine (LANTUS) 100 UNIT/ML injection Inject 0.22 mLs (22 Units total) into the skin every morning. 10/04/17   Julianne Rice, MD  Insulin Pen Needle 31G X 6 MM MISC 1 Device by Does not apply route 2 (two) times daily. 08/16/17   Charlynne Cousins, MD  lisinopril (PRINIVIL,ZESTRIL) 40 MG tablet Take 1 tablet (40 mg total) by mouth daily. 04/30/17   Dorie Rank, MD  metoprolol tartrate (LOPRESSOR) 25 MG tablet Take 0.5 tablets (12.5 mg total) by  mouth 2 (two) times daily. 03/07/17   Georgette Shell, MD    Family History Family History  Problem Relation Age of Onset  . Diabetes Mellitus II Mother   . Diabetes Mellitus II Father     Social History Social History   Tobacco Use  . Smoking status: Current Every Day Smoker    Packs/day: 0.10    Years: 40.00    Pack years: 4.00    Types: Cigarettes  . Smokeless tobacco: Never Used  Substance Use Topics  . Alcohol use: Yes    Alcohol/week: 2.0 standard drinks    Types: 2 Cans of beer per week    Comment: No labs available at time of assessment  . Drug use: No    Comment: 12/23/2014 "stopped ~ 10 yrs ago"     Allergies   Ibuprofen and Tylenol [acetaminophen]   Review of Systems Review of Systems  Constitutional: Negative for activity change.  Respiratory: Negative for shortness of breath.   Cardiovascular: Negative for chest pain.  Gastrointestinal: Negative for nausea and vomiting.     Physical Exam Updated Vital Signs BP (!) 175/95   Pulse 62   Temp 98 F (36.7 C)   Resp 16   Ht 6' 4" (1.93 m)   Wt 77.1 kg   SpO2 100%   BMI 20.69 kg/m   Physical Exam  Constitutional: He appears well-developed.  HENT:  Head: Atraumatic.  Neck: Neck supple.  Cardiovascular: Normal rate.  Pulmonary/Chest: Effort normal.  Neurological: He is alert.  Skin: Skin is warm.  Nursing note and vitals reviewed.    ED Treatments / Results  Labs (all labs ordered are listed, but only abnormal results are displayed) Labs Reviewed  I-STAT CHEM 8, ED - Abnormal; Notable for the following components:      Result Value   Glucose, Bld 430 (*)    Hemoglobin 11.2 (*)    HCT 33.0 (*)    All other components within normal limits  CBG MONITORING, ED - Abnormal; Notable for the following components:   Glucose-Capillary 394 (*)    All other components within normal limits    EKG None  Radiology No results found.  Procedures Procedures (including critical care  time)  Medications Ordered in ED Medications  insulin aspart (novoLOG) injection 10 Units (10 Units Subcutaneous Given 10/08/17 0831)     Initial Impression / Assessment and Plan / ED Course  I have reviewed the triage vital signs and the nursing notes.  Pertinent labs & imaging results that were available during my care of the patient were reviewed by me and considered in my medical decision making (see chart for details).     57 year old male with noncompliance history comes in with chief complaint of elevated blood sugar.  Patient does not have DKA.  Subcutaneous insulin given. Patient has passed oral challenge.  Hyperglycemia likely because of noncompliance and not because of underlying infectious process.  Final Clinical Impressions(s) / ED Diagnoses   Final  diagnoses:  Hyperglycemia    ED Discharge Orders    None           , , MD 10/08/17 0931  

## 2017-10-08 NOTE — ED Notes (Signed)
Patient given:  2x Pb 2x graham crackers 1x cheese stick 2x cups of water Per Dr Rhunette CroftNanavati

## 2017-10-08 NOTE — ED Notes (Signed)
Bed: WA08 Expected date:  Expected time:  Means of arrival:  Comments: 

## 2017-10-09 ENCOUNTER — Emergency Department (HOSPITAL_COMMUNITY)
Admission: EM | Admit: 2017-10-09 | Discharge: 2017-10-09 | Disposition: A | Discharge status: Home / Self Care | Payer: Self-pay | Attending: Emergency Medicine | Admitting: Emergency Medicine

## 2017-10-09 ENCOUNTER — Encounter (HOSPITAL_COMMUNITY): Payer: Self-pay | Admitting: Emergency Medicine

## 2017-10-09 ENCOUNTER — Other Ambulatory Visit: Payer: Self-pay

## 2017-10-09 ENCOUNTER — Encounter (HOSPITAL_COMMUNITY): Payer: Self-pay | Admitting: *Deleted

## 2017-10-09 DIAGNOSIS — E1165 Type 2 diabetes mellitus with hyperglycemia: Secondary | ICD-10-CM | POA: Insufficient documentation

## 2017-10-09 DIAGNOSIS — Z794 Long term (current) use of insulin: Secondary | ICD-10-CM | POA: Insufficient documentation

## 2017-10-09 DIAGNOSIS — R739 Hyperglycemia, unspecified: Secondary | ICD-10-CM

## 2017-10-09 DIAGNOSIS — I10 Essential (primary) hypertension: Secondary | ICD-10-CM | POA: Insufficient documentation

## 2017-10-09 DIAGNOSIS — Z79899 Other long term (current) drug therapy: Secondary | ICD-10-CM | POA: Insufficient documentation

## 2017-10-09 DIAGNOSIS — F17210 Nicotine dependence, cigarettes, uncomplicated: Secondary | ICD-10-CM | POA: Insufficient documentation

## 2017-10-09 LAB — CBG MONITORING, ED
GLUCOSE-CAPILLARY: 484 mg/dL — AB (ref 70–99)
GLUCOSE-CAPILLARY: 229 mg/dL — AB (ref 70–99)
Glucose-Capillary: 333 mg/dL — ABNORMAL HIGH (ref 70–99)
Glucose-Capillary: 146 mg/dL — ABNORMAL HIGH (ref 70–99)
Glucose-Capillary: 399 mg/dL — ABNORMAL HIGH (ref 70–99)
Glucose-Capillary: 287 mg/dL — ABNORMAL HIGH (ref 70–99)
Glucose-Capillary: 289 mg/dL — ABNORMAL HIGH (ref 70–99)

## 2017-10-09 LAB — I-STAT CHEM 8, ED
BUN: 20 mg/dL (ref 6–20)
CHLORIDE: 98 mmol/L (ref 98–111)
Calcium, Ion: 1.23 mmol/L (ref 1.15–1.40)
Creatinine, Ser: 0.9 mg/dL (ref 0.61–1.24)
Glucose, Bld: 409 mg/dL — ABNORMAL HIGH (ref 70–99)
HEMATOCRIT: 28 % — AB (ref 39.0–52.0)
Hemoglobin: 9.5 g/dL — ABNORMAL LOW (ref 13.0–17.0)
Potassium: 3.7 mmol/L (ref 3.5–5.1)
SODIUM: 135 mmol/L (ref 135–145)
TCO2: 28 mmol/L (ref 22–32)

## 2017-10-09 MED ORDER — INSULIN ASPART 100 UNIT/ML ~~LOC~~ SOLN
10.0000 [IU] | Freq: Once | SUBCUTANEOUS | Status: AC
  Administered 2017-10-09: 10 [IU] via SUBCUTANEOUS
  Filled 2017-10-09: qty 1

## 2017-10-09 NOTE — ED Triage Notes (Signed)
Patient is complaining hyperglycemia. Patient brought in by [facility]. [facility].

## 2017-10-09 NOTE — ED Notes (Signed)
Bed: [facility]4 Expected date:  Expected time:  Means of arrival:  Comments: EMS-elevated sugar

## 2017-10-09 NOTE — ED Provider Notes (Signed)
[facility] DEPT Provider Note   CSN: [id number] Arrival date & time: 10/09/17  1931     History   Chief Complaint Chief Complaint  Patient presents with  . Hyperglycemia    HPI Hasaan Smolinsky is a 57 y.o. male with a history of insulin-dependent diabetes is well-known to the emergency department with 72 visits in the last 6 months who was recently discharged by myself earlier today for hyperglycemia.  Patient is representing today for hyperglycemia.  Patient was brought in by EMS for this.  He denies any current complaints at this time.  HPI  Past Medical History:  Diagnosis Date  . Anxiety   . Anxiety   . Chronic lower back pain   . Depression   . DKA (diabetic ketoacidoses) ([place]) 07/30/2016  . Hyperlipemia   . Hypertension   . Migraine    "last one was ~ 4 yr ago" (12/23/2014)  . Seizures ([place])    "related to pills for anxiety; if I don't take the pills I'm suppose to take I'll have them" (12/23/2014)  . Type II diabetes mellitus [facility])     Patient Active Problem List   Diagnosis Date Noted  . 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 Hypernatremia   . Memory difficulties   . Type 2 diabetes mellitus with hyperosmolar nonketotic hyperglycemia ([place]) 08/03/2017  . Hyperglycemia 05/26/2017  . Noncompliance 05/26/2017  . AKI (acute kidney injury) ([place]) 03/04/2017  . Malnutrition of moderate degree 02/08/2017  . DKA (diabetic ketoacidoses) ([place]) 02/07/2017  . History of stroke 02/07/2017  . Hyperphosphatemia 12/13/2016  . Evaluation by psychiatric service required 11/30/2016  . Lactic acidosis 11/18/2016  . Anemia 11/06/2016  . HLD (hyperlipidemia) 11/06/2016  . Adjustment disorder with other symptoms 11/05/2016  . Uncontrolled type 2 diabetes mellitus with hyperosmolar nonketotic hyperglycemia ([place]) 08/24/2016  . Essential hypertension 08/24/2016  . Acute ischemic stroke ([place])   . Protein-calorie malnutrition, severe 12/24/2014   . Homelessness 12/23/2014  . Hypertensive urgency 12/23/2014  . DM (diabetes mellitus) ([place]) 12/23/2014  . Intermittent palpitations 12/23/2014  . Tobacco abuse 12/23/2014  . Pleuritic chest pain 12/23/2014  . Abnormal EKG 12/23/2014  . Type II diabetes mellitus with renal manifestations [facility])     Past Surgical History:  Procedure Laterality Date  . NO PAST SURGERIES          Home Medications    Prior to Admission medications   Medication Sig Start Date End Date Taking? Authorizing Provider  amLODipine (NORVASC) 10 MG tablet Take 1 tablet (10 mg total) by mouth daily. 05/28/17   Dessa Phi, DO  atorvastatin (LIPITOR) 40 MG tablet Take 1 tablet (40 mg total) by mouth daily. 01/26/17   Cardama, Grayce Sessions, MD  blood glucose meter kit and supplies KIT Dispense based on patient and insurance preference. Use up to four times daily as directed. (FOR ICD-9 250.00, 250.01). 11/22/16   Velvet Bathe, MD  blood glucose meter kit and supplies KIT Dispense based on patient and insurance preference. Use up to four times daily as directed. (FOR ICD-9 250.00, 250.01). 08/16/17   Charlynne Cousins, MD  ferrous sulfate 325 (65 FE) MG tablet Take 1 tablet (325 mg total) daily by mouth. Patient taking differently: Take 325 mg by mouth daily as needed (low iron).  11/23/16   Ward, Delice Bison, DO  insulin aspart (NOVOLOG) 100 UNIT/ML injection Inject 8 Units into the skin 3 (three) times daily with meals. 10/04/17   Julianne Rice, MD  insulin glargine (LANTUS) 100 UNIT/ML injection Inject 0.22 mLs (22 Units total) into the skin every morning. 10/04/17   Julianne Rice, MD  Insulin Pen Needle 31G X 6 MM MISC 1 Device by Does not apply route 2 (two) times daily. 08/16/17   Charlynne Cousins, MD  lisinopril (PRINIVIL,ZESTRIL) 40 MG tablet Take 1 tablet (40 mg total) by mouth daily. 04/30/17   Dorie Rank, MD  metoprolol tartrate (LOPRESSOR) 25 MG tablet Take 0.5 tablets (12.5 mg total) by mouth 2  (two) times daily. 03/07/17   Georgette Shell, MD    Family History Family History  Problem Relation Age of Onset  . Diabetes Mellitus II Mother   . Diabetes Mellitus II Father     Social History Social History   Tobacco Use  . Smoking status: Current Every Day Smoker    Packs/day: 0.10    Years: 40.00    Pack years: 4.00    Types: Cigarettes  . Smokeless tobacco: Never Used  Substance Use Topics  . Alcohol use: Yes    Alcohol/week: 2.0 standard drinks    Types: 2 Cans of beer per week    Comment: No labs available at time of assessment  . Drug use: No    Comment: 12/23/2014 "stopped ~ 10 yrs ago"     Allergies   Ibuprofen and Tylenol [acetaminophen]   Review of Systems Review of Systems  All other systems reviewed and are negative.    Physical Exam Updated Vital Signs BP (!) 152/94 (BP Location: Left Arm)   Pulse 60   Temp 98.4 F (36.9 C) (Oral)   Resp 12   Ht '6\' 3"'$  (1.905 m)   Wt 77.1 kg   SpO2 100%   BMI 21.25 kg/m   Physical Exam  Constitutional: He appears well-developed and well-nourished.  Disheveled  HENT:  Head: Normocephalic and atraumatic.  Right Ear: External ear normal.  Left Ear: External ear normal.  Nose: Nose normal.  Mouth/Throat: Uvula is midline, oropharynx is clear and moist and mucous membranes are normal. No tonsillar exudate.  Eyes: Pupils are equal, round, and reactive to light. Right eye exhibits no discharge. Left eye exhibits no discharge. No scleral icterus.  Neck: Trachea normal. Neck supple. No spinous process tenderness present. No neck rigidity. Normal range of motion present.  Cardiovascular: Normal rate, regular rhythm and intact distal pulses.  No murmur heard. Pulses:      Radial pulses are 2+ on the right side, and 2+ on the left side.  Pulmonary/Chest: Effort normal and breath sounds normal. He exhibits no tenderness.  Abdominal: Soft. Bowel sounds are normal. There is no tenderness. There is no rigidity,  no rebound and no guarding.  Musculoskeletal: He exhibits no edema.  Lymphadenopathy:    He has no cervical adenopathy.  Neurological: He is alert.  Skin: Skin is warm and dry. No rash noted. He is not diaphoretic.  Psychiatric: He has a normal mood and affect.  Nursing note and vitals reviewed.   ED Treatments / Results  Labs (all labs ordered are listed, but only abnormal results are displayed) Labs Reviewed  CBG MONITORING, ED - Abnormal; Notable for the following components:      Result Value   Glucose-Capillary 289 (*)    All other components within normal limits  CBG MONITORING, ED - Abnormal; Notable for the following components:   Glucose-Capillary 229 (*)    All other components within normal limits    EKG None  Radiology  No results found.  Procedures Procedures (including critical care time)  Medications Ordered in ED Medications - No data to display   Initial Impression / Assessment and Plan / ED Course  I have reviewed the triage vital signs and the nursing notes.  Pertinent labs & imaging results that were available during my care of the patient were reviewed by me and considered in my medical decision making (see chart for details).     57 year old male with a history of insulin-dependent diabetes presenting with hyperglycemia.  Patient was seen here by myself earlier today where DKA was ruled out.  Repeat CBG in the 200s.  He is without any nausea or vomiting at this current time.  Do not feel he needs any blood work at this time.  Will discharge home.  He is to follow with his PCP.  Handout given.  Final Clinical Impressions(s) / ED Diagnoses   Final diagnoses:  Hyperglycemia    ED Discharge Orders    None       Lorelle Gibbs 10/09/17 2218    Dorie Rank, MD 10/12/17 1319

## 2017-10-09 NOTE — Discharge Instructions (Signed)
Please take your medication as prescribed

## 2017-10-09 NOTE — ED Notes (Signed)
Bed: WTR7 Expected date:  Expected time:  Means of arrival:  Comments: 

## 2017-10-09 NOTE — ED Provider Notes (Signed)
[facility] DEPT Provider Note   CSN: [id number] Arrival date & time: 10/08/17  1751     History   Chief Complaint Chief Complaint  Patient presents with  . Hyperglycemia    HPI Mike [name] is a 56 y.o. male.  Patient presents via EMS with high blood sugar.  States he felt "bad" so he figured his blood sugar was high.  Frequent visits for the same.  He denies any fevers, chills, nausea, vomiting.  No chest pain or shortness of breath.  States compliance with his insulin regimen.  He arrives by EMS with fast food cups.  Patient's only complaint is that he is hungry.  He is a poor historian.  The history is provided by the EMS personnel and the patient.  Hyperglycemia  Associated symptoms: no abdominal pain, no chest pain, no dizziness, no dysuria, no fever, no nausea, no shortness of breath and no vomiting     Past Medical History:  Diagnosis Date  . Anxiety   . Anxiety   . Chronic lower back pain   . Depression   . DKA (diabetic ketoacidoses) ([place]) 07/30/2016  . Hyperlipemia   . Hypertension   . Migraine    "last one was ~ 4 yr ago" (12/23/2014)  . Seizures ([place])    "related to pills for anxiety; if I don't take the pills I'm suppose to take I'll have them" (12/23/2014)  . Type II diabetes mellitus [facility])     Patient Active Problem List   Diagnosis Date Noted  . 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 Hypernatremia   . Memory difficulties   . Type 2 diabetes mellitus with hyperosmolar nonketotic hyperglycemia ([place]) 08/03/2017  . Hyperglycemia 05/26/2017  . Noncompliance 05/26/2017  . AKI (acute kidney injury) ([place]) 03/04/2017  . Malnutrition of moderate degree 02/08/2017  . DKA (diabetic ketoacidoses) ([place]) 02/07/2017  . History of stroke 02/07/2017  . Hyperphosphatemia 12/13/2016  . Evaluation by psychiatric service required 11/30/2016  . Lactic acidosis 11/18/2016  . Anemia 11/06/2016  . HLD (hyperlipidemia) 11/06/2016    . Adjustment disorder with other symptoms 11/05/2016  . Uncontrolled type 2 diabetes mellitus with hyperosmolar nonketotic hyperglycemia ([place]) 08/24/2016  . Essential hypertension 08/24/2016  . Acute ischemic stroke ([place])   . Protein-calorie malnutrition, severe 12/24/2014  . Homelessness 12/23/2014  . Hypertensive urgency 12/23/2014  . DM (diabetes mellitus) ([place]) 12/23/2014  . Intermittent palpitations 12/23/2014  . Tobacco abuse 12/23/2014  . Pleuritic chest pain 12/23/2014  . Abnormal EKG 12/23/2014  . Type II diabetes mellitus with renal manifestations [facility])     Past Surgical History:  Procedure Laterality Date  . NO PAST SURGERIES          Home Medications    Prior to Admission medications   Medication Sig Start Date End Date Taking? Authorizing Provider  amLODipine (NORVASC) 10 MG tablet Take 1 tablet (10 mg total) by mouth daily. 05/28/17  Yes Dessa Phi, DO  atorvastatin (LIPITOR) 40 MG tablet Take 1 tablet (40 mg total) by mouth daily. 01/26/17  Yes Cardama, Grayce Sessions, MD  blood glucose meter kit and supplies KIT Dispense based on patient and insurance preference. Use up to four times daily as directed. (FOR ICD-9 250.00, 250.01). 11/22/16  Yes Velvet Bathe, MD  blood glucose meter kit and supplies KIT Dispense based on patient and insurance preference. Use up to four times daily as directed. (FOR ICD-9 250.00, 250.01). 08/16/17  Yes Charlynne Cousins, MD  ferrous sulfate  325 (65 FE) MG tablet Take 1 tablet (325 mg total) daily by mouth. Patient taking differently: Take 325 mg by mouth daily as needed (low iron).  11/23/16  Yes Ward, Cyril Mourning N, DO  insulin aspart (NOVOLOG) 100 UNIT/ML injection Inject 8 Units into the skin 3 (three) times daily with meals. 10/04/17  Yes Julianne Rice, MD  insulin glargine (LANTUS) 100 UNIT/ML injection Inject 0.22 mLs (22 Units total) into the skin every morning. 10/04/17  Yes Julianne Rice, MD  Insulin Pen Needle 31G X 6 MM  MISC 1 Device by Does not apply route 2 (two) times daily. 08/16/17  Yes Charlynne Cousins, MD  lisinopril (PRINIVIL,ZESTRIL) 40 MG tablet Take 1 tablet (40 mg total) by mouth daily. 04/30/17  Yes Dorie Rank, MD  metoprolol tartrate (LOPRESSOR) 25 MG tablet Take 0.5 tablets (12.5 mg total) by mouth 2 (two) times daily. 03/07/17  Yes Georgette Shell, MD    Family History Family History  Problem Relation Age of Onset  . Diabetes Mellitus II Mother   . Diabetes Mellitus II Father     Social History Social History   Tobacco Use  . Smoking status: Current Every Day Smoker    Packs/day: 0.10    Years: 40.00    Pack years: 4.00    Types: Cigarettes  . Smokeless tobacco: Never Used  Substance Use Topics  . Alcohol use: Yes    Alcohol/week: 2.0 standard drinks    Types: 2 Cans of beer per week    Comment: No labs available at time of assessment  . Drug use: No    Comment: 12/23/2014 "stopped ~ 10 yrs ago"     Allergies   Ibuprofen and Tylenol [acetaminophen]   Review of Systems Review of Systems  Constitutional: Negative for activity change, appetite change and fever.  HENT: Negative for congestion and rhinorrhea.   Eyes: Negative for visual disturbance.  Respiratory: Negative for cough, chest tightness, shortness of breath and wheezing.   Cardiovascular: Negative for chest pain.  Gastrointestinal: Negative for abdominal pain, nausea and vomiting.  Genitourinary: Negative for dysuria, hematuria, penile swelling, scrotal swelling and urgency.  Musculoskeletal: Negative for arthralgias and myalgias.  Skin: Negative for rash.  Neurological: Negative for dizziness, tremors and headaches.    all other systems are negative except as noted in the HPI and PMH.    Physical Exam Updated Vital Signs BP (!) 163/89 (BP Location: Right Arm)   Pulse (!) 54   Temp 100 F (37.8 C) (Oral)   Resp 20   SpO2 100%   Physical Exam  Constitutional: He is oriented to person, place, and  time. He appears well-developed and well-nourished. No distress.  Disheveled. Sleeping on exam  HENT:  Head: Normocephalic and atraumatic.  Mouth/Throat: Oropharynx is clear and moist. No oropharyngeal exudate.  Eyes: Pupils are equal, round, and reactive to light. Conjunctivae and EOM are normal.  Neck: Normal range of motion. Neck supple.  No meningismus.  Cardiovascular: Normal rate, regular rhythm, normal heart sounds and intact distal pulses.  No murmur heard. Pulmonary/Chest: Effort normal and breath sounds normal. No respiratory distress.  Abdominal: Soft. There is no tenderness. There is no rebound and no guarding.  Musculoskeletal: Normal range of motion. He exhibits no edema or tenderness.  Neurological: He is alert and oriented to person, place, and time. No cranial nerve deficit. He exhibits normal muscle tone. Coordination normal.  No ataxia on finger to nose bilaterally. No pronator drift. 5/5 strength throughout. CN  2-12 intact.Equal grip strength. Sensation intact.   Skin: Skin is warm. No rash noted.  Psychiatric: He has a normal mood and affect. His behavior is normal.  Nursing note and vitals reviewed.    ED Treatments / Results  Labs (all labs ordered are listed, but only abnormal results are displayed) Labs Reviewed  I-STAT CHEM 8, ED - Abnormal; Notable for the following components:      Result Value   Sodium 132 (*)    Chloride 95 (*)    BUN 24 (*)    Glucose, Bld 470 (*)    Hemoglobin 9.2 (*)    HCT 27.0 (*)    All other components within normal limits  CBG MONITORING, ED - Abnormal; Notable for the following components:   Glucose-Capillary 460 (*)    All other components within normal limits  CBG MONITORING, ED - Abnormal; Notable for the following components:   Glucose-Capillary 484 (*)    All other components within normal limits  CBG MONITORING, ED - Abnormal; Notable for the following components:   Glucose-Capillary 333 (*)    All other  components within normal limits  CBG MONITORING, ED - Abnormal; Notable for the following components:   Glucose-Capillary 146 (*)    All other components within normal limits    EKG None  Radiology No results found.  Procedures Procedures (including critical care time)  Medications Ordered in ED Medications  insulin aspart (novoLOG) injection 8 Units (8 Units Subcutaneous Given 10/09/17 0314)  insulin aspart (novoLOG) injection 10 Units (10 Units Subcutaneous Given 10/09/17 0528)     Initial Impression / Assessment and Plan / ED Course  I have reviewed the triage vital signs and the nursing notes.  Pertinent labs & imaging results that were available during my care of the patient were reviewed by me and considered in my medical decision making (see chart for details).    Patient well-known to the ED presenting with hyperglycemia.  Likely secondary to noncompliance. No infectious symptoms. No evidence of DKA.  Patient given p.o. fluids and subcutaneous insulin.  Patient appears to be at his baseline.  He is given a meal in the ED.  He is not in DKA.  He has a history of noncompliance and chronic hyperglycemia. He appears stable for discharge. Final Clinical Impressions(s) / ED Diagnoses   Final diagnoses:  Hyperglycemia    ED Discharge Orders    None       Darriel Sinquefield, Annie Main, MD 10/09/17 [phone]

## 2017-10-09 NOTE — Discharge Instructions (Signed)
Take your medications and insulin as prescribed. Followup with your doctor. Return to the ED if you develop new or worsening symptoms.

## 2017-10-09 NOTE — ED Triage Notes (Signed)
Pt bib EMS and presents hyperglycemia.  Pt's CBG was 502 for EMS.  Pt denies pain but reports being hungry.

## 2017-10-09 NOTE — ED Notes (Signed)
Bed: WLPT2 Expected date:  Expected time:  Means of arrival:  Comments: 

## 2017-10-09 NOTE — ED Notes (Signed)
Bed: WLPT3 Expected date:  Expected time:  Means of arrival:  Comments: 

## 2017-10-09 NOTE — ED Provider Notes (Signed)
[facility] DEPT Provider Note   CSN: [id number] Arrival date & time: 10/09/17  1328     History   Chief Complaint Chief Complaint  Patient presents with  . Hyperglycemia    HPI Mike [name] is a 57 y.o. male with a insulin-dependent diabetes who is well-known to the emergency department with 71 visits of the last 6 months who presents emergency department today for hyperglycemia.  Patient was seen here this morning for the same.  He reports since that time he has been to [employer] and drinking sugary soda.  He reports that he is without any fever, chills, chest pain, shortness of breath, abdominal pain, nausea/vomiting or cough.  He is a poor historian.    HPI  Past Medical History:  Diagnosis Date  . Anxiety   . Anxiety   . Chronic lower back pain   . Depression   . DKA (diabetic ketoacidoses) ([place]) 07/30/2016  . Hyperlipemia   . Hypertension   . Migraine    "last one was ~ 4 yr ago" (12/23/2014)  . Seizures ([place])    "related to pills for anxiety; if I don't take the pills I'm suppose to take I'll have them" (12/23/2014)  . Type II diabetes mellitus [facility])     Patient Active Problem List   Diagnosis Date Noted  . 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 Hypernatremia   . Memory difficulties   . Type 2 diabetes mellitus with hyperosmolar nonketotic hyperglycemia ([place]) 08/03/2017  . Hyperglycemia 05/26/2017  . Noncompliance 05/26/2017  . AKI (acute kidney injury) ([place]) 03/04/2017  . Malnutrition of moderate degree 02/08/2017  . DKA (diabetic ketoacidoses) ([place]) 02/07/2017  . History of stroke 02/07/2017  . Hyperphosphatemia 12/13/2016  . Evaluation by psychiatric service required 11/30/2016  . Lactic acidosis 11/18/2016  . Anemia 11/06/2016  . HLD (hyperlipidemia) 11/06/2016  . Adjustment disorder with other symptoms 11/05/2016  . Uncontrolled type 2 diabetes mellitus with hyperosmolar nonketotic hyperglycemia ([place])  08/24/2016  . Essential hypertension 08/24/2016  . Acute ischemic stroke ([place])   . Protein-calorie malnutrition, severe 12/24/2014  . Homelessness 12/23/2014  . Hypertensive urgency 12/23/2014  . DM (diabetes mellitus) ([place]) 12/23/2014  . Intermittent palpitations 12/23/2014  . Tobacco abuse 12/23/2014  . Pleuritic chest pain 12/23/2014  . Abnormal EKG 12/23/2014  . Type II diabetes mellitus with renal manifestations [facility])     Past Surgical History:  Procedure Laterality Date  . NO PAST SURGERIES          Home Medications    Prior to Admission medications   Medication Sig Start Date End Date Taking? Authorizing Provider  amLODipine (NORVASC) 10 MG tablet Take 1 tablet (10 mg total) by mouth daily. 05/28/17   Dessa Phi, DO  atorvastatin (LIPITOR) 40 MG tablet Take 1 tablet (40 mg total) by mouth daily. 01/26/17   Cardama, Grayce Sessions, MD  blood glucose meter kit and supplies KIT Dispense based on patient and insurance preference. Use up to four times daily as directed. (FOR ICD-9 250.00, 250.01). 11/22/16   Velvet Bathe, MD  blood glucose meter kit and supplies KIT Dispense based on patient and insurance preference. Use up to four times daily as directed. (FOR ICD-9 250.00, 250.01). 08/16/17   Charlynne Cousins, MD  ferrous sulfate 325 (65 FE) MG tablet Take 1 tablet (325 mg total) daily by mouth. Patient taking differently: Take 325 mg by mouth daily as needed (low iron).  11/23/16   Ward, Delice Bison,  DO  insulin aspart (NOVOLOG) 100 UNIT/ML injection Inject 8 Units into the skin 3 (three) times daily with meals. 10/04/17   Julianne Rice, MD  insulin glargine (LANTUS) 100 UNIT/ML injection Inject 0.22 mLs (22 Units total) into the skin every morning. 10/04/17   Julianne Rice, MD  Insulin Pen Needle 31G X 6 MM MISC 1 Device by Does not apply route 2 (two) times daily. 08/16/17   Charlynne Cousins, MD  lisinopril (PRINIVIL,ZESTRIL) 40 MG tablet Take 1 tablet (40 mg total) by  mouth daily. 04/30/17   Dorie Rank, MD  metoprolol tartrate (LOPRESSOR) 25 MG tablet Take 0.5 tablets (12.5 mg total) by mouth 2 (two) times daily. 03/07/17   Georgette Shell, MD    Family History Family History  Problem Relation Age of Onset  . Diabetes Mellitus II Mother   . Diabetes Mellitus II Father     Social History Social History   Tobacco Use  . Smoking status: Current Every Day Smoker    Packs/day: 0.10    Years: 40.00    Pack years: 4.00    Types: Cigarettes  . Smokeless tobacco: Never Used  Substance Use Topics  . Alcohol use: Yes    Alcohol/week: 2.0 standard drinks    Types: 2 Cans of beer per week    Comment: No labs available at time of assessment  . Drug use: No    Comment: 12/23/2014 "stopped ~ 10 yrs ago"     Allergies   Ibuprofen and Tylenol [acetaminophen]   Review of Systems Review of Systems  All other systems reviewed and are negative.    Physical Exam Updated Vital Signs BP 139/77   Pulse 71   Temp 98.6 F (37 C) (Oral)   Resp 16   SpO2 99%   Physical Exam  Constitutional: He appears well-developed and well-nourished.  Disheveled  HENT:  Head: Normocephalic and atraumatic.  Right Ear: External ear normal.  Left Ear: External ear normal.  Nose: Nose normal.  Mouth/Throat: Uvula is midline, oropharynx is clear and moist and mucous membranes are normal. No tonsillar exudate.  Eyes: Pupils are equal, round, and reactive to light. Right eye exhibits no discharge. Left eye exhibits no discharge. No scleral icterus.  Neck: Trachea normal. Neck supple. No spinous process tenderness present. No neck rigidity. Normal range of motion present.  Cardiovascular: Normal rate, regular rhythm and intact distal pulses.  No murmur heard. Pulses:      Radial pulses are 2+ on the right side, and 2+ on the left side.  Pulmonary/Chest: Effort normal and breath sounds normal. He exhibits no tenderness.  Abdominal: Soft. Bowel sounds are normal. He  exhibits no distension. There is no tenderness. There is no rebound and no guarding.  Musculoskeletal: He exhibits no edema.  Lymphadenopathy:    He has no cervical adenopathy.  Neurological: He is alert.  Skin: Skin is warm and dry. No rash noted. He is not diaphoretic.  Psychiatric: He has a normal mood and affect.  Nursing note and vitals reviewed.    ED Treatments / Results  Labs (all labs ordered are listed, but only abnormal results are displayed) Labs Reviewed  CBG MONITORING, ED - Abnormal; Notable for the following components:      Result Value   Glucose-Capillary 399 (*)    All other components within normal limits  I-STAT CHEM 8, ED - Abnormal; Notable for the following components:   Glucose, Bld 409 (*)    Hemoglobin 9.5 (*)  HCT 28.0 (*)    All other components within normal limits  CBG MONITORING, ED - Abnormal; Notable for the following components:   Glucose-Capillary 287 (*)    All other components within normal limits    EKG None  Radiology No results found.  Procedures Procedures (including critical care time)  Medications Ordered in ED Medications  insulin aspart (novoLOG) injection 10 Units (10 Units Subcutaneous Given 10/09/17 1422)     Initial Impression / Assessment and Plan / ED Course  I have reviewed the triage vital signs and the nursing notes.  Pertinent labs & imaging results that were available during my care of the patient were reviewed by me and considered in my medical decision making (see chart for details).     57 y.o. male with a history of insulin-dependent diabetes presents emergency department today for hyperglycemia.  Patient is well-known to the department.  Patient is noted to have a blood sugar of 399 upon arrival.  Chem-8 checked without evidence of anion gap acidosis.  Patient does not appear to be in DKA.  Patient was given subcu insulin with improvement of CBG.  He denies any infectious symptoms.  Vital signs are  reassuring.  Will discharge home.  He is to follow-up with his primary care provider.   Final Clinical Impressions(s) / ED Diagnoses   Final diagnoses:  Hyperglycemia    ED Discharge Orders    None       Lorelle Gibbs 10/09/17 1541    Sherwood Gambler, MD 10/09/17 1556

## 2017-10-09 NOTE — ED Notes (Signed)
Bed: [facility]PT4 Expected date:  Expected time:  Means of arrival:  Comments: 

## 2017-10-17 ENCOUNTER — Encounter (HOSPITAL_COMMUNITY): Payer: Self-pay | Admitting: Emergency Medicine

## 2017-10-17 ENCOUNTER — Emergency Department (HOSPITAL_COMMUNITY)
Admission: EM | Admit: 2017-10-17 | Discharge: 2017-10-17 | Disposition: A | Discharge status: Home / Self Care | Payer: Self-pay | Attending: Emergency Medicine | Admitting: Emergency Medicine

## 2017-10-17 DIAGNOSIS — Z8673 Personal history of transient ischemic attack (TIA), and cerebral infarction without residual deficits: Secondary | ICD-10-CM | POA: Insufficient documentation

## 2017-10-17 DIAGNOSIS — Z794 Long term (current) use of insulin: Secondary | ICD-10-CM | POA: Insufficient documentation

## 2017-10-17 DIAGNOSIS — Z79899 Other long term (current) drug therapy: Secondary | ICD-10-CM | POA: Insufficient documentation

## 2017-10-17 DIAGNOSIS — F17210 Nicotine dependence, cigarettes, uncomplicated: Secondary | ICD-10-CM | POA: Insufficient documentation

## 2017-10-17 DIAGNOSIS — R739 Hyperglycemia, unspecified: Secondary | ICD-10-CM

## 2017-10-17 DIAGNOSIS — E1165 Type 2 diabetes mellitus with hyperglycemia: Secondary | ICD-10-CM | POA: Insufficient documentation

## 2017-10-17 DIAGNOSIS — I10 Essential (primary) hypertension: Secondary | ICD-10-CM | POA: Insufficient documentation

## 2017-10-17 LAB — I-STAT CHEM 8, ED
BUN: 25 mg/dL — ABNORMAL HIGH (ref 6–20)
Calcium, Ion: 1.15 mmol/L (ref 1.15–1.40)
Chloride: 95 mmol/L — ABNORMAL LOW (ref 98–111)
Creatinine, Ser: 0.9 mg/dL (ref 0.61–1.24)
Glucose, Bld: 686 mg/dL (ref 70–99)
HCT: 28 % — ABNORMAL LOW (ref 39.0–52.0)
Hemoglobin: 9.5 g/dL — ABNORMAL LOW (ref 13.0–17.0)
Potassium: 4.8 mmol/L (ref 3.5–5.1)
Sodium: 132 mmol/L — ABNORMAL LOW (ref 135–145)
TCO2: 28 mmol/L (ref 22–32)

## 2017-10-17 LAB — CBG MONITORING, ED
GLUCOSE-CAPILLARY: 580 mg/dL — AB (ref 70–99)
Glucose-Capillary: >600 mg/dL (ref 70–99)

## 2017-10-17 MED ORDER — INSULIN ASPART PROT & ASPART (70-30 MIX) 100 UNIT/ML ~~LOC~~ SUSP
10.0000 [IU] | Freq: Once | SUBCUTANEOUS | Status: AC
  Administered 2017-10-17: 10 [IU] via SUBCUTANEOUS
  Filled 2017-10-17: qty 10

## 2017-10-17 MED ORDER — SODIUM CHLORIDE 0.9 % IV BOLUS: 1000.0000 mL | Freq: Once | INTRAVENOUS | Status: DC

## 2017-10-17 NOTE — Discharge Instructions (Signed)
Follow-up with your PCP for reevaluation of your high blood sugar.  Take your medicines as prescribed.  Return to the emergency department if any concerning signs or symptoms develop.

## 2017-10-17 NOTE — ED Triage Notes (Signed)
Per EMS, patient from [place], [facility] high with EMS.   20g L FA NS with EMS.

## 2017-10-17 NOTE — ED Notes (Signed)
Pt. CBG reading critical HIGH on meter, RN, Zella Ball made aware.

## 2017-10-17 NOTE — ED Provider Notes (Signed)
[facility] DEPT Provider Note   CSN: [id number] Arrival date & time: 10/17/17  1833     History   Chief Complaint Chief Complaint  Patient presents with  . Hyperglycemia    HPI Mike [name] is a 57 y.o. male with history of diabetes mellitus, hypertension, hyperlipidemia presents for evaluation of hyperglycemia.  Patient has a history of this and has been seen 73 times in the past 6 months for similar complaints.  Per triage note, patient was found by EMS downtown with high CBG readings with EMS.  He states he has been compliant with his medications and last took his insulin earlier today though he is historically noncompliant with his medications.  He states he has been eating and drinking well.  Denies any other complaints at this time. He has a care plan in place for hyperglycemia-related complaints.   The history is provided by the patient.    Past Medical History:  Diagnosis Date  . Anxiety   . Anxiety   . Chronic lower back pain   . Depression   . DKA (diabetic ketoacidoses) ([place]) 07/30/2016  . Hyperlipemia   . Hypertension   . Migraine    "last one was ~ 4 yr ago" (12/23/2014)  . Seizures ([place])    "related to pills for anxiety; if I don't take the pills I'm suppose to take I'll have them" (12/23/2014)  . Type II diabetes mellitus [facility])     Patient Active Problem List   Diagnosis Date Noted  . 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 Hypernatremia   . Memory difficulties   . Type 2 diabetes mellitus with hyperosmolar nonketotic hyperglycemia ([place]) 08/03/2017  . Hyperglycemia 05/26/2017  . Noncompliance 05/26/2017  . AKI (acute kidney injury) ([place]) 03/04/2017  . Malnutrition of moderate degree 02/08/2017  . DKA (diabetic ketoacidoses) ([place]) 02/07/2017  . History of stroke 02/07/2017  . Hyperphosphatemia 12/13/2016  . Evaluation by psychiatric service required 11/30/2016  . Lactic acidosis 11/18/2016  . Anemia 11/06/2016    . HLD (hyperlipidemia) 11/06/2016  . Adjustment disorder with other symptoms 11/05/2016  . Uncontrolled type 2 diabetes mellitus with hyperosmolar nonketotic hyperglycemia ([place]) 08/24/2016  . Essential hypertension 08/24/2016  . Acute ischemic stroke ([place])   . Protein-calorie malnutrition, severe 12/24/2014  . Homelessness 12/23/2014  . Hypertensive urgency 12/23/2014  . DM (diabetes mellitus) ([place]) 12/23/2014  . Intermittent palpitations 12/23/2014  . Tobacco abuse 12/23/2014  . Pleuritic chest pain 12/23/2014  . Abnormal EKG 12/23/2014  . Type II diabetes mellitus with renal manifestations [facility])     Past Surgical History:  Procedure Laterality Date  . NO PAST SURGERIES          Home Medications    Prior to Admission medications   Medication Sig Start Date End Date Taking? Authorizing Provider  amLODipine (NORVASC) 10 MG tablet Take 1 tablet (10 mg total) by mouth daily. 05/28/17   Dessa Phi, DO  atorvastatin (LIPITOR) 40 MG tablet Take 1 tablet (40 mg total) by mouth daily. 01/26/17   Cardama, Grayce Sessions, MD  blood glucose meter kit and supplies KIT Dispense based on patient and insurance preference. Use up to four times daily as directed. (FOR ICD-9 250.00, 250.01). 11/22/16   Velvet Bathe, MD  blood glucose meter kit and supplies KIT Dispense based on patient and insurance preference. Use up to four times daily as directed. (FOR ICD-9 250.00, 250.01). 08/16/17   Charlynne Cousins, MD  ferrous sulfate 325 (65 FE)  MG tablet Take 1 tablet (325 mg total) daily by mouth. Patient taking differently: Take 325 mg by mouth daily as needed (low iron).  11/23/16   Ward, Delice Bison, DO  insulin aspart (NOVOLOG) 100 UNIT/ML injection Inject 8 Units into the skin 3 (three) times daily with meals. 10/04/17   Julianne Rice, MD  insulin glargine (LANTUS) 100 UNIT/ML injection Inject 0.22 mLs (22 Units total) into the skin every morning. 10/04/17   Julianne Rice, MD  Insulin Pen  Needle 31G X 6 MM MISC 1 Device by Does not apply route 2 (two) times daily. 08/16/17   Charlynne Cousins, MD  lisinopril (PRINIVIL,ZESTRIL) 40 MG tablet Take 1 tablet (40 mg total) by mouth daily. 04/30/17   Dorie Rank, MD  metoprolol tartrate (LOPRESSOR) 25 MG tablet Take 0.5 tablets (12.5 mg total) by mouth 2 (two) times daily. 03/07/17   Georgette Shell, MD    Family History Family History  Problem Relation Age of Onset  . Diabetes Mellitus II Mother   . Diabetes Mellitus II Father     Social History Social History   Tobacco Use  . Smoking status: Current Every Day Smoker    Packs/day: 0.10    Years: 40.00    Pack years: 4.00    Types: Cigarettes  . Smokeless tobacco: Never Used  Substance Use Topics  . Alcohol use: Yes    Alcohol/week: 2.0 standard drinks    Types: 2 Cans of beer per week    Comment: No labs available at time of assessment  . Drug use: No    Comment: 12/23/2014 "stopped ~ 10 yrs ago"     Allergies   Ibuprofen and Tylenol [acetaminophen]   Review of Systems Review of Systems  Constitutional: Negative for chills and fever.  Respiratory: Negative for shortness of breath.   Cardiovascular: Negative for chest pain.  All other systems reviewed and are negative.    Physical Exam Updated Vital Signs BP (!) 146/69 (BP Location: Right Arm)   Pulse 80   Temp 99 F (37.2 C) (Oral)   Resp 14   SpO2 98%   Physical Exam  Constitutional: He appears well-developed and well-nourished. No distress.  HENT:  Head: Normocephalic and atraumatic.  Eyes: Conjunctivae are normal. Right eye exhibits no discharge. Left eye exhibits no discharge.  Neck: No JVD present. No tracheal deviation present.  Cardiovascular: Normal rate and regular rhythm.  Pulmonary/Chest: Effort normal and breath sounds normal.  Abdominal: Soft. Bowel sounds are normal. He exhibits no distension. There is no tenderness. There is no guarding.  Musculoskeletal: He exhibits no edema.    Neurological: He is alert.  Skin: Skin is warm and dry. No erythema.  Psychiatric: His behavior is normal.  Flat affect.   Nursing note and vitals reviewed.    ED Treatments / Results  Labs (all labs ordered are listed, but only abnormal results are displayed) Labs Reviewed  I-STAT CHEM 8, ED - Abnormal; Notable for the following components:      Result Value   Sodium 132 (*)    Chloride 95 (*)    BUN 25 (*)    Glucose, Bld 686 (*)    Hemoglobin 9.5 (*)    HCT 28.0 (*)    All other components within normal limits  CBG MONITORING, ED - Abnormal; Notable for the following components:   Glucose-Capillary >600 (*)    All other components within normal limits  CBG MONITORING, ED - Abnormal; Notable for the  following components:   Glucose-Capillary 580 (*)    All other components within normal limits    EKG None  Radiology No results found.  Procedures Procedures (including critical care time)  Medications Ordered in ED Medications  insulin aspart protamine- aspart (NOVOLOG MIX 70/30) injection 10 Units (10 Units Subcutaneous Given 10/17/17 1950)     Initial Impression / Assessment and Plan / ED Course  I have reviewed the triage vital signs and the nursing notes.  Pertinent labs & imaging results that were available during my care of the patient were reviewed by me and considered in my medical decision making (see chart for details).     Patient presents for evaluation of hyperglycemia.  He is unfortunately a poorly controlled diabetic and is seen very frequently in the emergency department for this complaint.  He is afebrile, vital signs are stable.  He is nontoxic in appearance.  CBG today 686.  I-STAT Chem-8 obtained with calculated anion gap of 9.  By definition is not in DKA.  No signs of acute life-threatening metabolic pathology.  He was given subcutaneous insulin and on reevaluation his CBG has improved to 580.  This is unfortunately generally his baseline.   Able for discharge with follow-up with his PCP.  Discussed strict ED return precautions. Pt verbalized understanding of and agreement with plan and is safe for discharge home at this time.   Final Clinical Impressions(s) / ED Diagnoses   Final diagnoses:  Hyperglycemia    ED Discharge Orders    None       Debroah Baller 10/17/17 2113    Orlie Dakin, MD 10/17/17 [phone]

## 2017-10-26 ENCOUNTER — Emergency Department (HOSPITAL_COMMUNITY)
Admission: EM | Admit: 2017-10-26 | Discharge: 2017-10-26 | Disposition: A | Discharge status: Home / Self Care | Payer: Self-pay | Attending: Emergency Medicine | Admitting: Emergency Medicine

## 2017-10-26 ENCOUNTER — Other Ambulatory Visit: Payer: Self-pay

## 2017-10-26 ENCOUNTER — Encounter (HOSPITAL_COMMUNITY): Payer: Self-pay | Admitting: *Deleted

## 2017-10-26 DIAGNOSIS — Z79899 Other long term (current) drug therapy: Secondary | ICD-10-CM | POA: Insufficient documentation

## 2017-10-26 DIAGNOSIS — E119 Type 2 diabetes mellitus without complications: Secondary | ICD-10-CM | POA: Insufficient documentation

## 2017-10-26 DIAGNOSIS — I10 Essential (primary) hypertension: Secondary | ICD-10-CM | POA: Insufficient documentation

## 2017-10-26 DIAGNOSIS — R739 Hyperglycemia, unspecified: Secondary | ICD-10-CM | POA: Insufficient documentation

## 2017-10-26 DIAGNOSIS — F17210 Nicotine dependence, cigarettes, uncomplicated: Secondary | ICD-10-CM | POA: Insufficient documentation

## 2017-10-26 DIAGNOSIS — Z794 Long term (current) use of insulin: Secondary | ICD-10-CM | POA: Insufficient documentation

## 2017-10-26 LAB — I-STAT CHEM 8, ED
BUN: 29 mg/dL — ABNORMAL HIGH (ref 6–20)
CHLORIDE: 98 mmol/L (ref 98–111)
CREATININE: 0.8 mg/dL (ref 0.61–1.24)
Calcium, Ion: 1.16 mmol/L (ref 1.15–1.40)
Glucose, Bld: 397 mg/dL — ABNORMAL HIGH (ref 70–99)
HEMATOCRIT: 31 % — AB (ref 39.0–52.0)
Hemoglobin: 10.5 g/dL — ABNORMAL LOW (ref 13.0–17.0)
Potassium: 4.6 mmol/L (ref 3.5–5.1)
Sodium: 135 mmol/L (ref 135–145)
TCO2: 29 mmol/L (ref 22–32)

## 2017-10-26 LAB — CBG MONITORING, ED: Glucose-Capillary: 382 mg/dL — ABNORMAL HIGH (ref 70–99)

## 2017-10-26 NOTE — ED Provider Notes (Signed)
[facility] DEPT Provider Note   CSN: [id number] Arrival date & time: 10/26/17  2206     History   Chief Complaint Chief Complaint  Patient presents with  . Hyperglycemia    HPI Mike [name] is a 57 y.o. male with history of type 2 diabetes mellitus, hypertension, hyperlipidemia presents for evaluation of hyperglycemia.  Triage note states that he was transported from the front of the jail house which the patient was apparently walking past and asked to be sure if to call an ambulance due to hyperglycemia.  He is well-known to this ED for similar complaints and has been seen 66 times in the past 6 months for this.  He states he has been compliant with his insulin and checked his blood sugar but does not remember what the reading was.  This is essentially identical to my last encounter with him on 10/17/2017.  Denies fevers, chills, nausea, vomiting, or any other complaints.  The history is provided by the patient.    Past Medical History:  Diagnosis Date  . Anxiety   . Anxiety   . Chronic lower back pain   . Depression   . DKA (diabetic ketoacidoses) ([place]) 07/30/2016  . Hyperlipemia   . Hypertension   . Migraine    "last one was ~ 4 yr ago" (12/23/2014)  . Seizures ([place])    "related to pills for anxiety; if I don't take the pills I'm suppose to take I'll have them" (12/23/2014)  . Type II diabetes mellitus [facility])     Patient Active Problem List   Diagnosis Date Noted  . 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 Hypernatremia   . Memory difficulties   . Type 2 diabetes mellitus with hyperosmolar nonketotic hyperglycemia ([place]) 08/03/2017  . Hyperglycemia 05/26/2017  . Noncompliance 05/26/2017  . AKI (acute kidney injury) ([place]) 03/04/2017  . Malnutrition of moderate degree 02/08/2017  . DKA (diabetic ketoacidoses) ([place]) 02/07/2017  . History of stroke 02/07/2017  . Hyperphosphatemia 12/13/2016  . Evaluation by psychiatric service  required 11/30/2016  . Lactic acidosis 11/18/2016  . Anemia 11/06/2016  . HLD (hyperlipidemia) 11/06/2016  . Adjustment disorder with other symptoms 11/05/2016  . Uncontrolled type 2 diabetes mellitus with hyperosmolar nonketotic hyperglycemia ([place]) 08/24/2016  . Essential hypertension 08/24/2016  . Acute ischemic stroke ([place])   . Protein-calorie malnutrition, severe 12/24/2014  . Homelessness 12/23/2014  . Hypertensive urgency 12/23/2014  . DM (diabetes mellitus) ([place]) 12/23/2014  . Intermittent palpitations 12/23/2014  . Tobacco abuse 12/23/2014  . Pleuritic chest pain 12/23/2014  . Abnormal EKG 12/23/2014  . Type II diabetes mellitus with renal manifestations [facility])     Past Surgical History:  Procedure Laterality Date  . NO PAST SURGERIES          Home Medications    Prior to Admission medications   Medication Sig Start Date End Date Taking? Authorizing Provider  amLODipine (NORVASC) 10 MG tablet Take 1 tablet (10 mg total) by mouth daily. 05/28/17   Dessa Phi, DO  atorvastatin (LIPITOR) 40 MG tablet Take 1 tablet (40 mg total) by mouth daily. 01/26/17   Cardama, Grayce Sessions, MD  blood glucose meter kit and supplies KIT Dispense based on patient and insurance preference. Use up to four times daily as directed. (FOR ICD-9 250.00, 250.01). 11/22/16   Velvet Bathe, MD  blood glucose meter kit and supplies KIT Dispense based on patient and insurance preference. Use up to four times daily as directed. (FOR ICD-9 250.00,  250.01). 08/16/17   Charlynne Cousins, MD  ferrous sulfate 325 (65 FE) MG tablet Take 1 tablet (325 mg total) daily by mouth. Patient taking differently: Take 325 mg by mouth daily as needed (low iron).  11/23/16   Ward, Delice Bison, DO  insulin aspart (NOVOLOG) 100 UNIT/ML injection Inject 8 Units into the skin 3 (three) times daily with meals. 10/04/17   Julianne Rice, MD  insulin glargine (LANTUS) 100 UNIT/ML injection Inject 0.22 mLs (22 Units total) into  the skin every morning. 10/04/17   Julianne Rice, MD  Insulin Pen Needle 31G X 6 MM MISC 1 Device by Does not apply route 2 (two) times daily. 08/16/17   Charlynne Cousins, MD  lisinopril (PRINIVIL,ZESTRIL) 40 MG tablet Take 1 tablet (40 mg total) by mouth daily. 04/30/17   Dorie Rank, MD  metoprolol tartrate (LOPRESSOR) 25 MG tablet Take 0.5 tablets (12.5 mg total) by mouth 2 (two) times daily. 03/07/17   Georgette Shell, MD    Family History Family History  Problem Relation Age of Onset  . Diabetes Mellitus II Mother   . Diabetes Mellitus II Father     Social History Social History   Tobacco Use  . Smoking status: Current Every Day Smoker    Packs/day: 0.10    Years: 40.00    Pack years: 4.00    Types: Cigarettes  . Smokeless tobacco: Never Used  Substance Use Topics  . Alcohol use: Yes    Alcohol/week: 2.0 standard drinks    Types: 2 Cans of beer per week    Comment: No labs available at time of assessment  . Drug use: No    Comment: 12/23/2014 "stopped ~ 10 yrs ago"     Allergies   Ibuprofen and Tylenol [acetaminophen]   Review of Systems Review of Systems  Constitutional: Negative for chills and fever.  Respiratory: Negative for shortness of breath.   Cardiovascular: Negative for chest pain.  Gastrointestinal: Negative for abdominal pain, diarrhea and vomiting.  Genitourinary: Negative for difficulty urinating.  All other systems reviewed and are negative.    Physical Exam Updated Vital Signs BP (!) 165/68   Pulse (!) 52   Temp 98.1 F (36.7 C) (Oral)   Resp 15   SpO2 100%   Physical Exam  Constitutional: He appears well-developed and well-nourished. No distress.  Wearing blue paper scrubs, malodorous  HENT:  Head: Normocephalic and atraumatic.  Eyes: Conjunctivae are normal. Right eye exhibits no discharge. Left eye exhibits no discharge.  Neck: No JVD present. No tracheal deviation present.  Cardiovascular: Normal rate.  Pulmonary/Chest:  Effort normal.  Abdominal: Soft. Bowel sounds are normal. He exhibits no distension. There is no tenderness. There is no guarding.  Musculoskeletal: He exhibits no edema.  Neurological: He is alert.  Skin: Skin is warm and dry. No erythema.  Psychiatric: He has a normal mood and affect. His behavior is normal.  Nursing note and vitals reviewed.    ED Treatments / Results  Labs (all labs ordered are listed, but only abnormal results are displayed) Labs Reviewed  CBG MONITORING, ED - Abnormal; Notable for the following components:      Result Value   Glucose-Capillary 382 (*)    All other components within normal limits  I-STAT CHEM 8, ED - Abnormal; Notable for the following components:   BUN 29 (*)    Glucose, Bld 397 (*)    Hemoglobin 10.5 (*)    HCT 31.0 (*)  All other components within normal limits    EKG None  Radiology No results found.  Procedures Procedures (including critical care time)  Medications Ordered in ED Medications - No data to display   Initial Impression / Assessment and Plan / ED Course  I have reviewed the triage vital signs and the nursing notes.  Pertinent labs & imaging results that were available during my care of the patient were reviewed by me and considered in my medical decision making (see chart for details).     Patient presents for hyperglycemia.  Seen in the ED frequently for this.  He is afebrile, vital signs are at his baseline.  He is nontoxic in appearance.  Lab work today shows glucose of 397 which is actually quite better than he typically presents with.  Calculated anion gap of 8.  He is not in DKA.  No evidence of acute life-threatening diabetes related pathology.  Tolerated p.o. food and fluids in the ED without difficulty.  Stable for discharge with follow-up with his PCP.  Discussed strict ED return precautions. Pt verbalized understanding of and agreement with plan and is safe for discharge home at this time.   Final  Clinical Impressions(s) / ED Diagnoses   Final diagnoses:  Hyperglycemia    ED Discharge Orders    None       Debroah Baller 10/26/17 2303    Fredia Sorrow, MD 10/28/17 [phone]

## 2017-10-26 NOTE — ED Triage Notes (Signed)
Patient arrives by [facility] with complaints of hyperglycemia. CBG 431. Patient was taken to jail and patient requested to come to the hospital.

## 2017-10-26 NOTE — ED Triage Notes (Signed)
Per EMS, pt transported from infront of the jail house, pt states he was walking past.  He went in the building to ask the sheriff officer to call the ambulance for him d/t hyperglycemia.  He is alert and oriented.

## 2017-10-26 NOTE — ED Notes (Signed)
Pt brought in d/t hyperglycemia which is chronic.  He

## 2017-10-26 NOTE — ED Notes (Signed)
Bed: ZO10 Expected date:  Expected time:  Means of arrival:  Comments: EMS 57 yo male hyperglycemia

## 2017-10-26 NOTE — ED Notes (Signed)
Food and drink given to pt.  

## 2017-10-26 NOTE — Discharge Instructions (Addendum)
Continue to take your home medicines as prescribed.  Return to the emergency department if any concerning signs or symptoms develop.

## 2017-11-01 ENCOUNTER — Other Ambulatory Visit: Payer: Self-pay

## 2017-11-01 ENCOUNTER — Emergency Department (HOSPITAL_COMMUNITY)
Admission: EM | Admit: 2017-11-01 | Discharge: 2017-11-01 | Disposition: A | Discharge status: Home / Self Care | Payer: Self-pay | Attending: Emergency Medicine | Admitting: Emergency Medicine

## 2017-11-01 ENCOUNTER — Encounter (HOSPITAL_COMMUNITY): Payer: Self-pay | Admitting: Emergency Medicine

## 2017-11-01 DIAGNOSIS — R739 Hyperglycemia, unspecified: Secondary | ICD-10-CM

## 2017-11-01 DIAGNOSIS — F17210 Nicotine dependence, cigarettes, uncomplicated: Secondary | ICD-10-CM | POA: Insufficient documentation

## 2017-11-01 DIAGNOSIS — Z79899 Other long term (current) drug therapy: Secondary | ICD-10-CM | POA: Insufficient documentation

## 2017-11-01 DIAGNOSIS — Z794 Long term (current) use of insulin: Secondary | ICD-10-CM | POA: Insufficient documentation

## 2017-11-01 DIAGNOSIS — I10 Essential (primary) hypertension: Secondary | ICD-10-CM | POA: Insufficient documentation

## 2017-11-01 DIAGNOSIS — E1165 Type 2 diabetes mellitus with hyperglycemia: Secondary | ICD-10-CM | POA: Insufficient documentation

## 2017-11-01 DIAGNOSIS — E1110 Type 2 diabetes mellitus with ketoacidosis without coma: Secondary | ICD-10-CM | POA: Insufficient documentation

## 2017-11-01 LAB — I-STAT CHEM 8, ED
BUN: 20 mg/dL (ref 6–20)
CALCIUM ION: 1.25 mmol/L (ref 1.15–1.40)
CHLORIDE: 103 mmol/L (ref 98–111)
Creatinine, Ser: 0.9 mg/dL (ref 0.61–1.24)
GLUCOSE: 592 mg/dL — AB (ref 70–99)
HCT: 32 % — ABNORMAL LOW (ref 39.0–52.0)
Hemoglobin: 10.9 g/dL — ABNORMAL LOW (ref 13.0–17.0)
Potassium: 4 mmol/L (ref 3.5–5.1)
SODIUM: 141 mmol/L (ref 135–145)
TCO2: 30 mmol/L (ref 22–32)

## 2017-11-01 LAB — CBG MONITORING, ED
Glucose-Capillary: 580 mg/dL (ref 70–99)
Glucose-Capillary: 496 mg/dL — ABNORMAL HIGH (ref 70–99)

## 2017-11-01 MED ORDER — INSULIN ASPART 100 UNIT/ML ~~LOC~~ SOLN: 10.0000 [IU] | Freq: Once | SUBCUTANEOUS | Status: DC

## 2017-11-01 MED ORDER — INSULIN ASPART 100 UNIT/ML ~~LOC~~ SOLN
12.0000 [IU] | Freq: Once | SUBCUTANEOUS | Status: AC
  Administered 2017-11-01: 12 [IU] via SUBCUTANEOUS
  Filled 2017-11-01: qty 1

## 2017-11-01 MED ORDER — INSULIN ASPART 100 UNIT/ML ~~LOC~~ SOLN
20.0000 [IU] | Freq: Once | SUBCUTANEOUS | Status: AC
  Administered 2017-11-01: 20 [IU] via SUBCUTANEOUS
  Filled 2017-11-01: qty 1

## 2017-11-01 NOTE — ED Triage Notes (Signed)
Pt transported to the ED by [place] as he was put out by staff. Pt denies c/o of any kind to EMS and to this Clinical research associate. CBG obtained on arrival high the rechecked at 580 and was 530 for EMS.

## 2017-11-01 NOTE — ED Triage Notes (Signed)
[place] [facility] [place]. Seen this morning for same.

## 2017-11-01 NOTE — ED Notes (Signed)
Dr Read Drivers at bed side insulin ordered and given order pt condition stable denies pain or other difficulty

## 2017-11-01 NOTE — ED Provider Notes (Signed)
[facility] DEPT Provider Note   CSN: [id number] Arrival date & time: 11/01/17  1930     History   Chief Complaint Chief Complaint  Patient presents with  . Hyperglycemia    HPI Mike [name] is a 57 y.o. male.  HPI   Patient is a 57 year old male with a history of diabetes, hypertension, hyperlipidemia, seizures. Patient [place] for [place].  States that he wants to get his blood sugars checked.  He denies any symptoms including chest pain, shortness of breath, cough, abdominal pain, nausea vomiting diarrhea or urinary symptoms.  No fevers or chills.  States that he is compliant with his insulin and took it earlier today.    Pt well known to the ED with care plan in place. He has had 61 visits to the ED in the past 6 months including a visit earlier today at 4 AM.  Note reviewed from this visit.  Past Medical History:  Diagnosis Date  . Anxiety   . Anxiety   . Chronic lower back pain   . Depression   . DKA (diabetic ketoacidoses) ([place]) 07/30/2016  . Hyperlipemia   . Hypertension   . Migraine    "last one was ~ 4 yr ago" (12/23/2014)  . Seizures ([place])    "related to pills for anxiety; if I don't take the pills I'm suppose to take I'll have them" (12/23/2014)  . Type II diabetes mellitus [facility])     Patient Active Problem List   Diagnosis Date Noted  . 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 Hypernatremia   . Memory difficulties   . Type 2 diabetes mellitus with hyperosmolar nonketotic hyperglycemia ([place]) 08/03/2017  . Hyperglycemia 05/26/2017  . Noncompliance 05/26/2017  . AKI (acute kidney injury) ([place]) 03/04/2017  . Malnutrition of moderate degree 02/08/2017  . DKA (diabetic ketoacidoses) ([place]) 02/07/2017  . History of stroke 02/07/2017  . Hyperphosphatemia 12/13/2016  . Evaluation by psychiatric service required 11/30/2016  . Lactic acidosis 11/18/2016  . Anemia 11/06/2016  .  HLD (hyperlipidemia) 11/06/2016  . Adjustment disorder with other symptoms 11/05/2016  . Uncontrolled type 2 diabetes mellitus with hyperosmolar nonketotic hyperglycemia ([place]) 08/24/2016  . Essential hypertension 08/24/2016  . Acute ischemic stroke ([place])   . Protein-calorie malnutrition, severe 12/24/2014  . Homelessness 12/23/2014  . Hypertensive urgency 12/23/2014  . DM (diabetes mellitus) ([place]) 12/23/2014  . Intermittent palpitations 12/23/2014  . Tobacco abuse 12/23/2014  . Pleuritic chest pain 12/23/2014  . Abnormal EKG 12/23/2014  . Type II diabetes mellitus with renal manifestations [facility])     Past Surgical History:  Procedure Laterality Date  . NO PAST SURGERIES          Home Medications    Prior to Admission medications   Medication Sig Start Date End Date Taking? Authorizing Provider  amLODipine (NORVASC) 10 MG tablet Take 1 tablet (10 mg total) by mouth daily. 05/28/17   Dessa Phi, DO  atorvastatin (LIPITOR) 40 MG tablet Take 1 tablet (40 mg total) by mouth daily. 01/26/17   Cardama, Grayce Sessions, MD  blood glucose meter kit and supplies KIT Dispense based on patient and insurance preference. Use up to four times daily as directed. (FOR ICD-9 250.00, 250.01). 11/22/16   Velvet Bathe, MD  blood glucose meter kit and supplies KIT Dispense based on patient and insurance preference. Use up to four times daily as directed. (FOR ICD-9 250.00, 250.01). 08/16/17   Charlynne Cousins,  MD  ferrous sulfate 325 (65 FE) MG tablet Take 1 tablet (325 mg total) daily by mouth. Patient taking differently: Take 325 mg by mouth daily as needed (low iron).  11/23/16   Ward, Delice Bison, DO  insulin aspart (NOVOLOG) 100 UNIT/ML injection Inject 8 Units into the skin 3 (three) times daily with meals. 10/04/17   Julianne Rice, MD  insulin glargine (LANTUS) 100 UNIT/ML injection Inject 0.22 mLs (22 Units total) into the skin every morning. 10/04/17   Julianne Rice, MD  Insulin Pen Needle  31G X 6 MM MISC 1 Device by Does not apply route 2 (two) times daily. 08/16/17   Charlynne Cousins, MD  lisinopril (PRINIVIL,ZESTRIL) 40 MG tablet Take 1 tablet (40 mg total) by mouth daily. 04/30/17   Dorie Rank, MD  metoprolol tartrate (LOPRESSOR) 25 MG tablet Take 0.5 tablets (12.5 mg total) by mouth 2 (two) times daily. 03/07/17   Georgette Shell, MD    Family History Family History  Problem Relation Age of Onset  . Diabetes Mellitus II Mother   . Diabetes Mellitus II Father     Social History Social History   Tobacco Use  . Smoking status: Current Every Day Smoker    Packs/day: 0.10    Years: 40.00    Pack years: 4.00    Types: Cigarettes  . Smokeless tobacco: Never Used  Substance Use Topics  . Alcohol use: Yes    Alcohol/week: 2.0 standard drinks    Types: 2 Cans of beer per week    Comment: No labs available at time of assessment  . Drug use: No    Comment: 12/23/2014 "stopped ~ 10 yrs ago"     Allergies   Ibuprofen and Tylenol [acetaminophen]   Review of Systems Review of Systems  Constitutional: Negative for fever.  Respiratory: Negative for shortness of breath.   Cardiovascular: Negative for chest pain.  Gastrointestinal: Negative for abdominal pain, constipation, diarrhea, nausea and vomiting.  Endocrine:       Hyperglycemia  Genitourinary: Negative for dysuria and frequency.  Musculoskeletal: Negative for back pain.  Neurological: Negative for headaches.     Physical Exam Updated Vital Signs BP (!) 179/89 (BP Location: Right Arm)   Pulse 85   Temp 98.9 F (37.2 C) (Oral)   Resp 16   SpO2 95%   Physical Exam  Constitutional:  Disheveled. Smells of urine.   HENT:  Head: Normocephalic and atraumatic.  Eyes: Conjunctivae are normal.  Neck: Neck supple.  Cardiovascular: Normal rate, regular rhythm and normal heart sounds.  Pulmonary/Chest: Effort normal and breath sounds normal. No stridor. No respiratory distress. He has no wheezes.    Abdominal: Soft. Bowel sounds are normal. He exhibits no distension. There is no tenderness. There is no guarding.  Musculoskeletal: Normal range of motion.  Neurological: He is alert.  Skin: Skin is warm and dry.  Psychiatric: He has a normal mood and affect.  Nursing note and vitals reviewed.  ED Treatments / Results  Labs (all labs ordered are listed, but only abnormal results are displayed) Labs Reviewed  CBG MONITORING, ED - Abnormal; Notable for the following components:      Result Value   Glucose-Capillary 496 (*)    All other components within normal limits  I-STAT CHEM 8, ED - Abnormal; Notable for the following components:   Glucose, Bld 592 (*)    Hemoglobin 10.9 (*)    HCT 32.0 (*)    All other components within normal limits  EKG None  Radiology No results found.  Procedures Procedures (including critical care time)  Medications Ordered in ED Medications  insulin aspart (novoLOG) injection 12 Units (12 Units Subcutaneous Given 11/01/17 2059)     Initial Impression / Assessment and Plan / ED Course  I have reviewed the triage vital signs and the nursing notes.  Pertinent labs & imaging results that were available during my care of the patient were reviewed by me and considered in my medical decision making (see chart for details).   Final Clinical Impressions(s) / ED Diagnoses   Final diagnoses:  Hyperglycemia   Patient here for hyperglycemia.  Seen earlier today for same.  Blood sugar 496. istat chem 8 with elevated glucose to 592, hgb low, consistent with prior. No elevated anion gap. Calculated gap is 8. No evidence of DKA. Pt given multiple cups of po fluids as well as subq insulin. Tolerated PO with no difficulty. He will be discharged to f/u with pcp. Advised to return if worse. Case discussed with Dr. Billy Fischer who is in agreement with plan.    ED Discharge Orders    None       Bishop Dublin 11/01/17 2132    Gareth Morgan, MD 11/04/17 1208

## 2017-11-01 NOTE — ED Provider Notes (Signed)
[facility] DEPT Provider Note: Georgena Spurling, MD, FACEP  CSN: [id number] MRN: [id number] ARRIVAL: 11/01/17 at [place]: RESB/RESB   CHIEF COMPLAINT  Wants to be Checked  Level 5 caveat: Uncooperative HISTORY OF PRESENT ILLNESS  11/01/17 3:54 AM Mike [name] is a 57 y.o. male homeless male with diabetes.  He has had 60 visits to the ED in the past 6 months, his usual discharge diagnosis has been hyperglycemia.  He has a care plan in place.  He called EMS this morning because he wanted "to be checked out".  He does not have any specific chief complaint.  He alleges to be compliant with his insulin.  He did urinate on himself prior to arrival but this is normal behavior for him.  The patient is reticent to engage in conversation, therefore a level 5 caveat applies.   Past Medical History:  Diagnosis Date  . Anxiety   . Anxiety   . Chronic lower back pain   . Depression   . DKA (diabetic ketoacidoses) ([place]) 07/30/2016  . Hyperlipemia   . Hypertension   . Migraine    "last one was ~ 4 yr ago" (12/23/2014)  . Seizures ([place])    "related to pills for anxiety; if I don't take the pills I'm suppose to take I'll have them" (12/23/2014)  . Type II diabetes mellitus ([place])     Past Surgical History:  Procedure Laterality Date  . NO PAST SURGERIES      Family History  Problem Relation Age of Onset  . Diabetes Mellitus II Mother   . Diabetes Mellitus II Father     Social History   Tobacco Use  . Smoking status: Current Every Day Smoker    Packs/day: 0.10    Years: 40.00    Pack years: 4.00    Types: Cigarettes  . Smokeless tobacco: Never Used  Substance Use Topics  . Alcohol use: Yes    Alcohol/week: 2.0 standard drinks    Types: 2 Cans of beer per week    Comment: No labs available at time of assessment  . Drug use: No    Comment: 12/23/2014 "stopped ~ 10 yrs ago"    Prior to Admission medications   Medication Sig Start Date End Date Taking? Authorizing  Provider  amLODipine (NORVASC) 10 MG tablet Take 1 tablet (10 mg total) by mouth daily. 05/28/17   Dessa Phi, DO  atorvastatin (LIPITOR) 40 MG tablet Take 1 tablet (40 mg total) by mouth daily. 01/26/17   Cardama, Grayce Sessions, MD  blood glucose meter kit and supplies KIT Dispense based on patient and insurance preference. Use up to four times daily as directed. (FOR ICD-9 250.00, 250.01). 11/22/16   Velvet Bathe, MD  blood glucose meter kit and supplies KIT Dispense based on patient and insurance preference. Use up to four times daily as directed. (FOR ICD-9 250.00, 250.01). 08/16/17   Charlynne Cousins, MD  ferrous sulfate 325 (65 FE) MG tablet Take 1 tablet (325 mg total) daily by mouth. Patient taking differently: Take 325 mg by mouth daily as needed (low iron).  11/23/16   Ward, Delice Bison, DO  insulin aspart (NOVOLOG) 100 UNIT/ML injection Inject 8 Units into the skin 3 (three) times daily with meals. 10/04/17   Julianne Rice, MD  insulin glargine (LANTUS) 100 UNIT/ML injection Inject 0.22 mLs (22 Units total) into the skin every morning. 10/04/17   Julianne Rice, MD  Insulin Pen Needle 31G X 6 MM MISC 1 Device  by Does not apply route 2 (two) times daily. 08/16/17   Charlynne Cousins, MD  lisinopril (PRINIVIL,ZESTRIL) 40 MG tablet Take 1 tablet (40 mg total) by mouth daily. 04/30/17   Dorie Rank, MD  metoprolol tartrate (LOPRESSOR) 25 MG tablet Take 0.5 tablets (12.5 mg total) by mouth 2 (two) times daily. 03/07/17   Georgette Shell, MD    Allergies Ibuprofen and Tylenol [acetaminophen]   REVIEW OF SYSTEMS     PHYSICAL EXAMINATION  Initial Vital Signs Blood pressure (!) 159/72, pulse 74, temperature 98.5 F (36.9 C), temperature source Oral, resp. rate 18, SpO2 100 %.  Examination General: Well-developed, well-nourished male in no acute distress; appears older than age of record HENT: normocephalic; atraumatic; breath nonketotic Eyes: pupils equal, round and reactive to  light; extraocular muscles intact Neck: supple Heart: regular rate and rhythm Lungs: clear to auscultation bilaterally; no [place] respirations Abdomen: soft; nondistended; nontender; bowel sounds present Extremities: No deformity; full range of motion; pulses normal Neurologic: Awake, alert; motor function intact in all extremities and symmetric; no facial droop Skin: Warm and dry Psychiatric: Flat affect; poverty of speech   RESULTS  Summary of this visit's results, reviewed by myself:   EKG Interpretation  Date/Time:    Ventricular Rate:    PR Interval:    QRS Duration:   QT Interval:    QTC Calculation:   R Axis:     Text Interpretation:        Laboratory Studies: Results for orders placed or performed during the hospital encounter of 11/01/17 (from the past 24 hour(s))  CBG monitoring, ED     Status: Abnormal   Collection Time: 11/01/17  3:33 AM  Result Value Ref Range   Glucose-Capillary >600 (HH) 70 - 99 mg/dL  CBG monitoring, ED     Status: Abnormal   Collection Time: 11/01/17  3:35 AM  Result Value Ref Range   Glucose-Capillary 580 (HH) 70 - 99 mg/dL   Imaging Studies: No results found.  ED COURSE and MDM  Nursing notes and initial vitals signs, including pulse oximetry, reviewed.  Vitals:   11/01/17 0344 11/01/17 0347  BP: (!) 159/72   Pulse: 74   Resp: 18   Temp: 98.5 F (36.9 C)   TempSrc: Oral   SpO2: 100% 100%   In accordance with the patient's care plan, since patient is not in DKA, he was given subcu insulin and discharged.  PROCEDURES    ED DIAGNOSES     ICD-10-CM   1. Hyperglycemia R73.9        Mike [name], Jenny Reichmann, MD 11/01/17 0410

## 2017-11-01 NOTE — Discharge Instructions (Signed)
Take your regularly prescribed insulin.   Please follow up with your primary care provider within 5-7 days for re-evaluation of your symptoms. Please return to the emergency department for any new or worsening symptoms.

## 2017-11-01 NOTE — ED Notes (Signed)
EDP [place] notified of 592 POC Glu

## 2017-11-01 NOTE — ED Notes (Signed)
Bed: WTR7 Expected date:  Expected time:  Means of arrival:  Comments: EMS [place]

## 2017-11-05 LAB — CBG MONITORING, ED

## 2017-12-12 ENCOUNTER — Other Ambulatory Visit: Payer: Self-pay

## 2017-12-12 ENCOUNTER — Emergency Department (HOSPITAL_COMMUNITY)
Admission: EM | Admit: 2017-12-12 | Discharge: 2017-12-12 | Disposition: A | Discharge status: Home / Self Care | Payer: Self-pay | Attending: Emergency Medicine | Admitting: Emergency Medicine

## 2017-12-12 DIAGNOSIS — Z79899 Other long term (current) drug therapy: Secondary | ICD-10-CM | POA: Insufficient documentation

## 2017-12-12 DIAGNOSIS — I10 Essential (primary) hypertension: Secondary | ICD-10-CM | POA: Insufficient documentation

## 2017-12-12 DIAGNOSIS — F17210 Nicotine dependence, cigarettes, uncomplicated: Secondary | ICD-10-CM | POA: Insufficient documentation

## 2017-12-12 DIAGNOSIS — E1165 Type 2 diabetes mellitus with hyperglycemia: Secondary | ICD-10-CM | POA: Insufficient documentation

## 2017-12-12 DIAGNOSIS — Z794 Long term (current) use of insulin: Secondary | ICD-10-CM | POA: Insufficient documentation

## 2017-12-12 LAB — I-STAT CHEM 8, ED
BUN: 24 mg/dL — AB (ref 6–20)
CALCIUM ION: 1.25 mmol/L (ref 1.15–1.40)
Chloride: 106 mmol/L (ref 98–111)
Creatinine, Ser: 0.8 mg/dL (ref 0.61–1.24)
Glucose, Bld: 99 mg/dL (ref 70–99)
HEMATOCRIT: 33 % — AB (ref 39.0–52.0)
Hemoglobin: 11.2 g/dL — ABNORMAL LOW (ref 13.0–17.0)
Potassium: 3.4 mmol/L — ABNORMAL LOW (ref 3.5–5.1)
Sodium: 145 mmol/L (ref 135–145)
TCO2: 33 mmol/L — AB (ref 22–32)

## 2017-12-12 LAB — CBG MONITORING, ED: Glucose-Capillary: 118 mg/dL — ABNORMAL HIGH (ref 70–99)

## 2017-12-12 NOTE — ED Notes (Signed)
Bed: WLPT2 Expected date:  Expected time:  Means of arrival:  Comments: 

## 2017-12-12 NOTE — Discharge Instructions (Signed)
1. Medications: usual home medications °2. Treatment: rest, drink plenty of fluids,  °3. Follow Up: Please followup with your primary doctor in 2-3 days for discussion of your diagnoses and further evaluation after today's visit; if you do not have a primary care doctor use the resource guide provided to find one; Please return to the ER for worsening symptoms ° °

## 2017-12-12 NOTE — ED Triage Notes (Signed)
Pt to ed [facility] with c/o diabetic hyperglycemia. [facility] CBG 158, and patient was asking for food the whole ride to ED.

## 2017-12-12 NOTE — ED Provider Notes (Signed)
[facility] DEPT Provider Note   CSN: [id number] Arrival date & time: 12/12/17  0218     History   Chief Complaint Chief Complaint  Patient presents with  . Hyperglycemia    HPI Mike [name] is a 57 y.o. male with a hx of type 2 diabetes, hypertension, hyperlipidemia presents to the Emergency Department complaining of hyperglycemia and feeling hungry.  Per EMS, CBG 158.  They report that patient has been asking for food in route to the hospital.  Patient has been seen numerous times for similar complaints.  He has previously had compliance issues with his medication but states he has been compliant recently.  Patient denies headache, neck pain, chest pain, shortness of breath, fever, chills, nausea, vomiting or any other complaints.  No known aggravating or alleviating factors.  The history is provided by the patient and medical records. No language interpreter was used.    Past Medical History:  Diagnosis Date  . Anxiety   . Anxiety   . Chronic lower back pain   . Depression   . DKA (diabetic ketoacidoses) ([place]) 07/30/2016  . Hyperlipemia   . Hypertension   . Migraine    "last one was ~ 4 yr ago" (12/23/2014)  . Seizures ([place])    "related to pills for anxiety; if I don't take the pills I'm suppose to take I'll have them" (12/23/2014)  . Type II diabetes mellitus [facility])     Patient Active Problem List   Diagnosis Date Noted  . 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 Hypernatremia   . Memory difficulties   . Type 2 diabetes mellitus with hyperosmolar nonketotic hyperglycemia ([place]) 08/03/2017  . Hyperglycemia 05/26/2017  . Noncompliance 05/26/2017  . AKI (acute kidney injury) ([place]) 03/04/2017  . Malnutrition of moderate degree 02/08/2017  . DKA (diabetic ketoacidoses) ([place]) 02/07/2017  . History of stroke 02/07/2017  . Hyperphosphatemia 12/13/2016  . Evaluation by psychiatric service required 11/30/2016  . Lactic acidosis  11/18/2016  . Anemia 11/06/2016  . HLD (hyperlipidemia) 11/06/2016  . Adjustment disorder with other symptoms 11/05/2016  . Uncontrolled type 2 diabetes mellitus with hyperosmolar nonketotic hyperglycemia ([place]) 08/24/2016  . Essential hypertension 08/24/2016  . Acute ischemic stroke ([place])   . Protein-calorie malnutrition, severe 12/24/2014  . Homelessness 12/23/2014  . Hypertensive urgency 12/23/2014  . DM (diabetes mellitus) ([place]) 12/23/2014  . Intermittent palpitations 12/23/2014  . Tobacco abuse 12/23/2014  . Pleuritic chest pain 12/23/2014  . Abnormal EKG 12/23/2014  . Type II diabetes mellitus with renal manifestations [facility])     Past Surgical History:  Procedure Laterality Date  . NO PAST SURGERIES          Home Medications    Prior to Admission medications   Medication Sig Start Date End Date Taking? Authorizing Provider  amLODipine (NORVASC) 10 MG tablet Take 1 tablet (10 mg total) by mouth daily. 05/28/17   Dessa Phi, DO  atorvastatin (LIPITOR) 40 MG tablet Take 1 tablet (40 mg total) by mouth daily. 01/26/17   Cardama, Grayce Sessions, MD  blood glucose meter kit and supplies KIT Dispense based on patient and insurance preference. Use up to four times daily as directed. (FOR ICD-9 250.00, 250.01). 11/22/16   Velvet Bathe, MD  blood glucose meter kit and supplies KIT Dispense based on patient and insurance preference. Use up to four times daily as directed. (FOR ICD-9 250.00, 250.01). 08/16/17   Charlynne Cousins, MD  ferrous sulfate 325 (65 FE) MG tablet Take  1 tablet (325 mg total) daily by mouth. Patient taking differently: Take 325 mg by mouth daily as needed (low iron).  11/23/16   Ward, Delice Bison, DO  insulin aspart (NOVOLOG) 100 UNIT/ML injection Inject 8 Units into the skin 3 (three) times daily with meals. 10/04/17   Julianne Rice, MD  insulin glargine (LANTUS) 100 UNIT/ML injection Inject 0.22 mLs (22 Units total) into the skin every morning. 10/04/17    Julianne Rice, MD  Insulin Pen Needle 31G X 6 MM MISC 1 Device by Does not apply route 2 (two) times daily. 08/16/17   Charlynne Cousins, MD  lisinopril (PRINIVIL,ZESTRIL) 40 MG tablet Take 1 tablet (40 mg total) by mouth daily. 04/30/17   Dorie Rank, MD  metoprolol tartrate (LOPRESSOR) 25 MG tablet Take 0.5 tablets (12.5 mg total) by mouth 2 (two) times daily. 03/07/17   Georgette Shell, MD    Family History Family History  Problem Relation Age of Onset  . Diabetes Mellitus II Mother   . Diabetes Mellitus II Father     Social History Social History   Tobacco Use  . Smoking status: Current Every Day Smoker    Packs/day: 0.10    Years: 40.00    Pack years: 4.00    Types: Cigarettes  . Smokeless tobacco: Never Used  Substance Use Topics  . Alcohol use: Yes    Alcohol/week: 2.0 standard drinks    Types: 2 Cans of beer per week    Comment: No labs available at time of assessment  . Drug use: No    Comment: 12/23/2014 "stopped ~ 10 yrs ago"     Allergies   Ibuprofen and Tylenol [acetaminophen]   Review of Systems Review of Systems  Constitutional: Negative for appetite change, diaphoresis, fatigue, fever and unexpected weight change.  HENT: Negative for mouth sores.   Eyes: Negative for visual disturbance.  Respiratory: Negative for cough, chest tightness, shortness of breath and wheezing.   Cardiovascular: Negative for chest pain.  Gastrointestinal: Negative for abdominal pain, constipation, diarrhea, nausea and vomiting.       Feeling hungry  Endocrine: Negative for polydipsia, polyphagia and polyuria.  Genitourinary: Negative for dysuria, frequency, hematuria and urgency.  Musculoskeletal: Negative for back pain and neck stiffness.  Skin: Negative for rash.  Allergic/Immunologic: Negative for immunocompromised state.  Neurological: Negative for syncope, light-headedness and headaches.  Hematological: Does not bruise/bleed easily.  Psychiatric/Behavioral:  Negative for sleep disturbance. The patient is not nervous/anxious.      Physical Exam Updated Vital Signs BP (!) 171/90 (BP Location: Right Arm)   Pulse (!) 58   Temp 98.1 F (36.7 C) (Oral)   Resp 16   Ht '6\' 2"'$  (1.88 m)   Wt 77.1 kg   SpO2 95%   BMI 21.82 kg/m   Physical Exam  Constitutional: He appears well-developed and well-nourished. No distress.  Disheveled, malodorous  HENT:  Head: Normocephalic.  Eyes: Conjunctivae are normal. No scleral icterus.  Neck: Normal range of motion.  Cardiovascular: Normal rate, regular rhythm, normal heart sounds and intact distal pulses.  Pulmonary/Chest: Effort normal and breath sounds normal. No respiratory distress. He has no wheezes.  Abdominal: Soft. He exhibits no distension. There is no tenderness.  Musculoskeletal: Normal range of motion. He exhibits no edema.  Neurological: He is alert.  Skin: Skin is warm and dry.  Psychiatric: He has a normal mood and affect.  Nursing note and vitals reviewed.    ED Treatments / Results  Labs (all  labs ordered are listed, but only abnormal results are displayed) Labs Reviewed  CBG MONITORING, ED - Abnormal; Notable for the following components:      Result Value   Glucose-Capillary 118 (*)    All other components within normal limits  I-STAT CHEM 8, ED - Abnormal; Notable for the following components:   Potassium 3.4 (*)    BUN 24 (*)    TCO2 33 (*)    Hemoglobin 11.2 (*)    HCT 33.0 (*)    All other components within normal limits    Procedures Procedures (including critical care time)  Medications Ordered in ED Medications - No data to display   Initial Impression / Assessment and Plan / ED Course  I have reviewed the triage vital signs and the nursing notes.  Pertinent labs & imaging results that were available during my care of the patient were reviewed by me and considered in my medical decision making (see chart for details).  Clinical Course as of Dec 12 299    Wed Dec 12, 2017  0244 Anion gap 6   [HM]  0245 Better than usual presentation  Glucose-Capillary(!): 118 [HM]    Clinical Course User Index [HM] Audria Takeshita, Jarrett [place], [place]    Patient presents with complaint of hyperglycemia.  Glucose 118.  This is significantly better from patient's normal hyperglycemic readings in the 400s.  He continues to request food.  He has tolerated p.o. here in the department.  Will not give insulin at this time due to blood sugar being only minimally elevated and lower than patient's baseline.  Anion gap 6.  No evidence of DKA.  He is alert and oriented.  Patient will be discharged at this time.  Discussed reasons to return immediately to the emergency department.  Patient states understanding and is in agreement with the plan.  Final Clinical Impressions(s) / ED Diagnoses   Final diagnoses:  Uncontrolled type 2 diabetes mellitus with hyperglycemia [facility])    ED Discharge Orders    None       Agapito Games 12/12/17 Halford Chessman, MD 12/12/17 [phone]

## 2017-12-13 ENCOUNTER — Emergency Department (HOSPITAL_COMMUNITY)
Admission: EM | Admit: 2017-12-13 | Discharge: 2017-12-13 | Disposition: A | Discharge status: Home / Self Care | Payer: Self-pay | Attending: Emergency Medicine | Admitting: Emergency Medicine

## 2017-12-13 ENCOUNTER — Other Ambulatory Visit: Payer: Self-pay

## 2017-12-13 DIAGNOSIS — F17210 Nicotine dependence, cigarettes, uncomplicated: Secondary | ICD-10-CM | POA: Insufficient documentation

## 2017-12-13 DIAGNOSIS — Z590 Homelessness: Secondary | ICD-10-CM | POA: Insufficient documentation

## 2017-12-13 DIAGNOSIS — Z9119 Patient's noncompliance with other medical treatment and regimen: Secondary | ICD-10-CM | POA: Insufficient documentation

## 2017-12-13 DIAGNOSIS — Z794 Long term (current) use of insulin: Secondary | ICD-10-CM | POA: Insufficient documentation

## 2017-12-13 DIAGNOSIS — R739 Hyperglycemia, unspecified: Secondary | ICD-10-CM

## 2017-12-13 DIAGNOSIS — Z8673 Personal history of transient ischemic attack (TIA), and cerebral infarction without residual deficits: Secondary | ICD-10-CM | POA: Insufficient documentation

## 2017-12-13 DIAGNOSIS — Z79899 Other long term (current) drug therapy: Secondary | ICD-10-CM | POA: Insufficient documentation

## 2017-12-13 DIAGNOSIS — E1165 Type 2 diabetes mellitus with hyperglycemia: Secondary | ICD-10-CM | POA: Insufficient documentation

## 2017-12-13 LAB — I-STAT CHEM 8, ED
BUN: 38 mg/dL — AB (ref 6–20)
CALCIUM ION: 1.15 mmol/L (ref 1.15–1.40)
Chloride: 95 mmol/L — ABNORMAL LOW (ref 98–111)
Creatinine, Ser: 1 mg/dL (ref 0.61–1.24)
Glucose, Bld: >700 mg/dL (ref 70–99)
HCT: 32 % — ABNORMAL LOW (ref 39.0–52.0)
Hemoglobin: 10.9 g/dL — ABNORMAL LOW (ref 13.0–17.0)
Potassium: 4.5 mmol/L (ref 3.5–5.1)
SODIUM: 133 mmol/L — AB (ref 135–145)
TCO2: 32 mmol/L (ref 22–32)

## 2017-12-13 LAB — CBG MONITORING, ED
Glucose-Capillary: >600 mg/dL (ref 70–99)
Glucose-Capillary: >600 mg/dL (ref 70–99)

## 2017-12-13 MED ORDER — INSULIN ASPART PROT & ASPART (70-30 MIX) 100 UNIT/ML ~~LOC~~ SUSP
10.0000 [IU] | Freq: Once | SUBCUTANEOUS | Status: AC
  Administered 2017-12-13: 10 [IU] via SUBCUTANEOUS
  Filled 2017-12-13: qty 10

## 2017-12-13 MED ORDER — SODIUM CHLORIDE 0.9 % IV BOLUS
1000.0000 mL | Freq: Once | INTRAVENOUS | Status: AC
  Administered 2017-12-13: 1000 mL via INTRAVENOUS

## 2017-12-13 NOTE — Discharge Instructions (Signed)
Your blood sugar was very high today.  You need to be taking your insulin at home.  Follow up with Mike [name] for help managing your sugars.  Return to ER for new or worsening symptoms, any additional concerns.

## 2017-12-13 NOTE — ED Provider Notes (Signed)
[facility] DEPT Provider Note   CSN: [id number] Arrival date & time: 12/13/17  1848     History   Chief Complaint Chief Complaint  Patient presents with  . Hyperglycemia    HPI Mike [name] is a 57 y.o. male.  The history is provided by the patient and medical records. No language interpreter was used.   Mike [name] is a 57 y.o. male with hx of DM who presents to ED for hyperglycemia.  Patient states that he should be on insulin, but does not know his dose and does not have any at home.  This is a recurrent issue.  He denies any fever, chills, abdominal pain, nausea or vomiting.  Per EMS, patient's blood sugar in the 500s.  Started on IV fluids in the field.  Past Medical History:  Diagnosis Date  . Anxiety   . Anxiety   . Chronic lower back pain   . Depression   . DKA (diabetic ketoacidoses) ([place]) 07/30/2016  . Hyperlipemia   . Hypertension   . Migraine    "last one was ~ 4 yr ago" (12/23/2014)  . Seizures ([place])    "related to pills for anxiety; if I don't take the pills I'm suppose to take I'll have them" (12/23/2014)  . Type II diabetes mellitus [facility])     Patient Active Problem List   Diagnosis Date Noted  . 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 Hypernatremia   . Memory difficulties   . Type 2 diabetes mellitus with hyperosmolar nonketotic hyperglycemia ([place]) 08/03/2017  . Hyperglycemia 05/26/2017  . Noncompliance 05/26/2017  . AKI (acute kidney injury) ([place]) 03/04/2017  . Malnutrition of moderate degree 02/08/2017  . DKA (diabetic ketoacidoses) ([place]) 02/07/2017  . History of stroke 02/07/2017  . Hyperphosphatemia 12/13/2016  . Evaluation by psychiatric service required 11/30/2016  . Lactic acidosis 11/18/2016  . Anemia 11/06/2016  . HLD (hyperlipidemia) 11/06/2016  . Adjustment disorder with other symptoms 11/05/2016  . Uncontrolled type 2 diabetes mellitus with hyperosmolar nonketotic hyperglycemia ([place])  08/24/2016  . Essential hypertension 08/24/2016  . Acute ischemic stroke ([place])   . Protein-calorie malnutrition, severe 12/24/2014  . Homelessness 12/23/2014  . Hypertensive urgency 12/23/2014  . DM (diabetes mellitus) ([place]) 12/23/2014  . Intermittent palpitations 12/23/2014  . Tobacco abuse 12/23/2014  . Pleuritic chest pain 12/23/2014  . Abnormal EKG 12/23/2014  . Type II diabetes mellitus with renal manifestations [facility])     Past Surgical History:  Procedure Laterality Date  . NO PAST SURGERIES          Home Medications    Prior to Admission medications   Medication Sig Start Date End Date Taking? Authorizing Provider  amLODipine (NORVASC) 10 MG tablet Take 1 tablet (10 mg total) by mouth daily. Patient not taking: Reported on 12/13/2017 05/28/17   Dessa Phi, DO  atorvastatin (LIPITOR) 40 MG tablet Take 1 tablet (40 mg total) by mouth daily. Patient not taking: Reported on 12/13/2017 01/26/17   Fatima Blank, MD  blood glucose meter kit and supplies KIT Dispense based on patient and insurance preference. Use up to four times daily as directed. (FOR ICD-9 250.00, 250.01). Patient not taking: Reported on 12/13/2017 11/22/16   Velvet Bathe, MD  blood glucose meter kit and supplies KIT Dispense based on patient and insurance preference. Use up to four times daily as directed. (FOR ICD-9 250.00, 250.01). Patient not taking: Reported on 12/13/2017 08/16/17   Charlynne Cousins, MD  ferrous sulfate 325 (65  FE) MG tablet Take 1 tablet (325 mg total) daily by mouth. Patient not taking: Reported on 12/13/2017 11/23/16   Carmesha Morocco, Delice Bison, DO  insulin aspart (NOVOLOG) 100 UNIT/ML injection Inject 8 Units into the skin 3 (three) times daily with meals. Patient not taking: Reported on 12/13/2017 10/04/17   Julianne Rice, MD  insulin glargine (LANTUS) 100 UNIT/ML injection Inject 0.22 mLs (22 Units total) into the skin every morning. Patient not taking: Reported on 12/13/2017  10/04/17   Julianne Rice, MD  Insulin Pen Needle 31G X 6 MM MISC 1 Device by Does not apply route 2 (two) times daily. Patient not taking: Reported on 12/13/2017 08/16/17   Charlynne Cousins, MD  lisinopril (PRINIVIL,ZESTRIL) 40 MG tablet Take 1 tablet (40 mg total) by mouth daily. Patient not taking: Reported on 12/13/2017 04/30/17   Dorie Rank, MD  metoprolol tartrate (LOPRESSOR) 25 MG tablet Take 0.5 tablets (12.5 mg total) by mouth 2 (two) times daily. Patient not taking: Reported on 12/13/2017 03/07/17   Georgette Shell, MD    Family History Family History  Problem Relation Age of Onset  . Diabetes Mellitus II Mother   . Diabetes Mellitus II Father     Social History Social History   Tobacco Use  . Smoking status: Current Every Day Smoker    Packs/day: 0.10    Years: 40.00    Pack years: 4.00    Types: Cigarettes  . Smokeless tobacco: Never Used  Substance Use Topics  . Alcohol use: Yes    Alcohol/week: 2.0 standard drinks    Types: 2 Cans of beer per week    Comment: No labs available at time of assessment  . Drug use: No    Comment: 12/23/2014 "stopped ~ 10 yrs ago"     Allergies   Ibuprofen and Tylenol [acetaminophen]   Review of Systems Review of Systems  Endocrine:       + hyperglycemia  All other systems reviewed and are negative.    Physical Exam Updated Vital Signs BP (!) 187/89 (BP Location: Right Arm)   Pulse 72   Temp 98.7 F (37.1 C) (Oral)   Resp 16   SpO2 100%   Physical Exam  Constitutional: He is oriented to person, place, and time. He appears well-developed and well-nourished. No distress.  HENT:  Head: Normocephalic and atraumatic.  Neck: Neck supple.  Cardiovascular: Normal rate, regular rhythm and normal heart sounds.  No murmur heard. Pulmonary/Chest: Effort normal and breath sounds normal. No respiratory distress.  Abdominal: Soft. He exhibits no distension. There is no tenderness.  Neurological: He is alert and  oriented to person, place, and time.  Skin: Skin is warm and dry.  Nursing note and vitals reviewed.    ED Treatments / Results  Labs (all labs ordered are listed, but only abnormal results are displayed) Labs Reviewed  I-STAT CHEM 8, ED - Abnormal; Notable for the following components:      Result Value   Sodium 133 (*)    Chloride 95 (*)    BUN 38 (*)    Glucose, Bld >700 (*)    Hemoglobin 10.9 (*)    HCT 32.0 (*)    All other components within normal limits  CBG MONITORING, ED - Abnormal; Notable for the following components:   Glucose-Capillary >600 (*)    All other components within normal limits  CBG MONITORING, ED - Abnormal; Notable for the following components:   Glucose-Capillary >600 (*)    All  other components within normal limits    EKG None  Radiology No results found.  Procedures Procedures (including critical care time)  Medications Ordered in ED Medications  sodium chloride 0.9 % bolus 1,000 mL (1,000 mLs Intravenous New Bag/Given 12/13/17 1955)  insulin aspart protamine- aspart (NOVOLOG MIX 70/30) injection 10 Units (10 Units Subcutaneous Given 12/13/17 2003)     Initial Impression / Assessment and Plan / ED Course  I have reviewed the triage vital signs and the nursing notes.  Pertinent labs & imaging results that were available during my care of the patient were reviewed by me and considered in my medical decision making (see chart for details).    Mike [name] is a 57 y.o. male who presents to ED for hyperglycemia. Hx of uncontrolled DM with frequent ED visits for hyperglycemia. On exam, patient is afebrile and hemodynamically stable. Appears in no distress. Initial CBG > 700. Given insulin and fluids. Chem-8 without evidence of DKA. Calculated anion gap of 6. Normal co2. Clinically does not appear to be in DKA either. Re-evaluated following fluids/insulin with reassuring exam. Unfortunately, hyperglycemia is a chronic problem for Mike [name].  We again discussed importance of him taking his medications when not in the ER and recommended PCP follow up. Hopefully he will do so. Evaluation does not show pathology that would require ongoing emergent intervention or inpatient treatment.   Patient discussed with Dr. Regenia Skeeter who agrees with treatment plan.    Final Clinical Impressions(s) / ED Diagnoses   Final diagnoses:  Hyperglycemia    ED Discharge Orders    None       Cainen Burnham, Ozella Almond, PA-C 12/13/17 2035    Sherwood Gambler, MD 12/13/17 2139

## 2017-12-13 NOTE — ED Notes (Signed)
Bed: WA12 Expected date:  Expected time:  Means of arrival:  Comments: Ems-hyperglycemia 

## 2017-12-13 NOTE — ED Notes (Signed)
10 units given 2nd rn bobby

## 2017-12-13 NOTE — ED Notes (Signed)
[place] no bread given per provider

## 2017-12-13 NOTE — ED Triage Notes (Signed)
Per EMS: Pt was on a stranger's front porch.  They called the police, and pt stated his sugar was high, that his tooth hurts and to call EMS.  Pt's initial CBG 545, 200 cc NS given.

## 2017-12-13 NOTE — ED Notes (Signed)
Asher MuirJamie, PA notified of I-stat Chem 8 results.

## 2017-12-15 ENCOUNTER — Emergency Department (HOSPITAL_COMMUNITY)
Admission: EM | Admit: 2017-12-15 | Discharge: 2017-12-16 | Disposition: A | Discharge status: Home / Self Care | Payer: Self-pay | Attending: Emergency Medicine | Admitting: Emergency Medicine

## 2017-12-15 ENCOUNTER — Encounter (HOSPITAL_COMMUNITY): Payer: Self-pay | Admitting: Emergency Medicine

## 2017-12-15 DIAGNOSIS — D649 Anemia, unspecified: Secondary | ICD-10-CM | POA: Insufficient documentation

## 2017-12-15 DIAGNOSIS — R739 Hyperglycemia, unspecified: Secondary | ICD-10-CM

## 2017-12-15 DIAGNOSIS — I10 Essential (primary) hypertension: Secondary | ICD-10-CM | POA: Insufficient documentation

## 2017-12-15 DIAGNOSIS — E1065 Type 1 diabetes mellitus with hyperglycemia: Secondary | ICD-10-CM | POA: Insufficient documentation

## 2017-12-15 DIAGNOSIS — Z794 Long term (current) use of insulin: Secondary | ICD-10-CM | POA: Insufficient documentation

## 2017-12-15 DIAGNOSIS — Z79899 Other long term (current) drug therapy: Secondary | ICD-10-CM | POA: Insufficient documentation

## 2017-12-15 LAB — CBG MONITORING, ED: Glucose-Capillary: >600 mg/dL (ref 70–99)

## 2017-12-15 MED ORDER — INSULIN ASPART 100 UNIT/ML ~~LOC~~ SOLN
10.0000 [IU] | Freq: Once | SUBCUTANEOUS | Status: AC
  Administered 2017-12-15: 10 [IU] via SUBCUTANEOUS

## 2017-12-15 NOTE — ED Provider Notes (Signed)
[facility] EMERGENCY DEPARTMENT Provider Note   CSN: [id number] Arrival date & time: 12/15/17  2247     History   Chief Complaint Chief Complaint  Patient presents with  . Hyperglycemia    HPI Mike [name] is a 57 y.o. male.  The history is provided by the patient.  Hyperglycemia  He has history of diabetes, hypertension, hyperlipidemia, depression and comes in because he thinks his blood sugar is high.  He has history of noncompliance, but he states that he did take his insulin this morning.  He does admit that he has missed some doses but he is very vague about when doses he has missed.  He does not check his glucose at home.  He admits to polyuria and polydipsia.  He denies nausea and vomiting.  Denies fever or chills.  Denies any pain.  Past Medical History:  Diagnosis Date  . Anxiety   . Anxiety   . Chronic lower back pain   . Depression   . DKA (diabetic ketoacidoses) ([place]) 07/30/2016  . Hyperlipemia   . Hypertension   . Migraine    "last one was ~ 4 yr ago" (12/23/2014)  . Seizures ([place])    "related to pills for anxiety; if I don't take the pills I'm suppose to take I'll have them" (12/23/2014)  . Type II diabetes mellitus [facility])     Patient Active Problem List   Diagnosis Date Noted  . 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 Hypernatremia   . Memory difficulties   . Type 2 diabetes mellitus with hyperosmolar nonketotic hyperglycemia ([place]) 08/03/2017  . Hyperglycemia 05/26/2017  . Noncompliance 05/26/2017  . AKI (acute kidney injury) ([place]) 03/04/2017  . Malnutrition of moderate degree 02/08/2017  . DKA (diabetic ketoacidoses) ([place]) 02/07/2017  . History of stroke 02/07/2017  . Hyperphosphatemia 12/13/2016  . Evaluation by psychiatric service required 11/30/2016  . Lactic acidosis 11/18/2016  . Anemia 11/06/2016  . HLD (hyperlipidemia) 11/06/2016  . Adjustment disorder with other symptoms 11/05/2016  . Uncontrolled type 2  diabetes mellitus with hyperosmolar nonketotic hyperglycemia ([place]) 08/24/2016  . Essential hypertension 08/24/2016  . Acute ischemic stroke ([place])   . Protein-calorie malnutrition, severe 12/24/2014  . Homelessness 12/23/2014  . Hypertensive urgency 12/23/2014  . DM (diabetes mellitus) ([place]) 12/23/2014  . Intermittent palpitations 12/23/2014  . Tobacco abuse 12/23/2014  . Pleuritic chest pain 12/23/2014  . Abnormal EKG 12/23/2014  . Type II diabetes mellitus with renal manifestations [facility])     Past Surgical History:  Procedure Laterality Date  . NO PAST SURGERIES          Home Medications    Prior to Admission medications   Medication Sig Start Date End Date Taking? Authorizing Provider  amLODipine (NORVASC) 10 MG tablet Take 1 tablet (10 mg total) by mouth daily. Patient not taking: Reported on 12/13/2017 05/28/17   Dessa Phi, DO  atorvastatin (LIPITOR) 40 MG tablet Take 1 tablet (40 mg total) by mouth daily. Patient not taking: Reported on 12/13/2017 01/26/17   Fatima Blank, MD  blood glucose meter kit and supplies KIT Dispense based on patient and insurance preference. Use up to four times daily as directed. (FOR ICD-9 250.00, 250.01). Patient not taking: Reported on 12/13/2017 11/22/16   Velvet Bathe, MD  blood glucose meter kit and supplies KIT Dispense based on patient and insurance preference. Use up to four times daily as directed. (FOR ICD-9 250.00, 250.01). Patient not taking: Reported on 12/13/2017 08/16/17  Charlynne Cousins, MD  ferrous sulfate 325 (65 FE) MG tablet Take 1 tablet (325 mg total) daily by mouth. Patient not taking: Reported on 12/13/2017 11/23/16   Ward, Delice Bison, DO  insulin aspart (NOVOLOG) 100 UNIT/ML injection Inject 8 Units into the skin 3 (three) times daily with meals. Patient not taking: Reported on 12/13/2017 10/04/17   Julianne Rice, MD  insulin glargine (LANTUS) 100 UNIT/ML injection Inject 0.22 mLs (22 Units total) into the  skin every morning. Patient not taking: Reported on 12/13/2017 10/04/17   Julianne Rice, MD  Insulin Pen Needle 31G X 6 MM MISC 1 Device by Does not apply route 2 (two) times daily. Patient not taking: Reported on 12/13/2017 08/16/17   Charlynne Cousins, MD  lisinopril (PRINIVIL,ZESTRIL) 40 MG tablet Take 1 tablet (40 mg total) by mouth daily. Patient not taking: Reported on 12/13/2017 04/30/17   Dorie Rank, MD  metoprolol tartrate (LOPRESSOR) 25 MG tablet Take 0.5 tablets (12.5 mg total) by mouth 2 (two) times daily. Patient not taking: Reported on 12/13/2017 03/07/17   Georgette Shell, MD    Family History Family History  Problem Relation Age of Onset  . Diabetes Mellitus II Mother   . Diabetes Mellitus II Father     Social History Social History   Tobacco Use  . Smoking status: Current Every Day Smoker    Packs/day: 0.10    Years: 40.00    Pack years: 4.00    Types: Cigarettes  . Smokeless tobacco: Never Used  Substance Use Topics  . Alcohol use: Yes    Alcohol/week: 2.0 standard drinks    Types: 2 Cans of beer per week    Comment: No labs available at time of assessment  . Drug use: No    Comment: 12/23/2014 "stopped ~ 10 yrs ago"     Allergies   Ibuprofen and Tylenol [acetaminophen]   Review of Systems Review of Systems  All other systems reviewed and are negative.    Physical Exam Updated Vital Signs BP (!) 184/93 (BP Location: Right Arm)   Pulse 89   Temp 99.8 F (37.7 C) (Oral)   Resp 18   SpO2 98%   Physical Exam  Nursing note and vitals reviewed.  57 year old male, resting comfortably and in no acute distress. Vital signs are significant for elevated blood pressure. Oxygen saturation is 98%, which is normal. Head is normocephalic and atraumatic. PERRLA, EOMI. Oropharynx is clear. Neck is nontender and supple without adenopathy or JVD. Back is nontender and there is no CVA tenderness. Lungs are clear without rales, wheezes, or  rhonchi. Chest is nontender. Heart has regular rate and rhythm without murmur. Abdomen is soft, flat, nontender without masses or hepatosplenomegaly and peristalsis is normoactive. Extremities have no cyanosis or edema, full range of motion is present. Skin is warm and dry without rash. Neurologic: Mental status is normal, cranial nerves are intact, there are no motor or sensory deficits.  ED Treatments / Results  Labs (all labs ordered are listed, but only abnormal results are displayed) Labs Reviewed  CBC WITH DIFFERENTIAL/PLATELET - Abnormal; Notable for the following components:      Result Value   WBC 3.9 (*)    RBC 4.19 (*)    Hemoglobin 10.5 (*)    HCT 33.7 (*)    MCH 25.1 (*)    All other components within normal limits  BASIC METABOLIC PANEL - Abnormal; Notable for the following components:   Glucose, Bld 772 (*)  All other components within normal limits  CBG MONITORING, ED - Abnormal; Notable for the following components:   Glucose-Capillary >600 (*)    All other components within normal limits  CBG MONITORING, ED - Abnormal; Notable for the following components:   Glucose-Capillary 183 (*)    All other components within normal limits    EKG EKG Interpretation  Date/Time:  Saturday December 15 2017 22:56:32 EST Ventricular Rate:  91 PR Interval:    QRS Duration: 100 QT Interval:  369 QTC Calculation: 413 R Axis:   53 Text Interpretation:  Sinus rhythm Atrial premature complexes in couplets Probable left atrial enlargement Left ventricular hypertrophy Repol abnrm suggests ischemia, inferior leads ST elevation, consider anterolateral injury When compared with ECG of 10/04/2017, No significant change was found Confirmed by Delora Fuel ([id number]) on 12/15/2017 11:12:16 PM  Procedures Procedures  CRITICAL CARE Performed by: Delora Fuel Total critical care time: 50 minutes Critical care time was exclusive of separately billable procedures and treating other  patients. Critical care was necessary to treat or prevent imminent or life-threatening deterioration. Critical care was time spent personally by me on the following activities: development of treatment plan with patient and/or surrogate as well as nursing, discussions with consultants, evaluation of patient's response to treatment, examination of patient, obtaining history from patient or surrogate, ordering and performing treatments and interventions, ordering and review of laboratory studies, ordering and review of radiographic studies, pulse oximetry and re-evaluation of patient's condition.  Medications Ordered in ED Medications  insulin aspart (novoLOG) injection 10 Units (10 Units Subcutaneous Given 12/15/17 2338)     Initial Impression / Assessment and Plan / ED Course  I have reviewed the triage vital signs and the nursing notes.  Pertinent labs & imaging results that were available during my care of the patient were reviewed by me and considered in my medical decision making (see chart for details).  Hyperglycemia.  CBG is greater than 600.  Old records are reviewed, and he has multiple ED visits for hypoglycemia, but does have occasional hospitalization for ketoacidosis.  Will check basic metabolic panel and give subcutaneous insulin.  Of note, ECG shows no acute changes.  2:36 AM Initial glucose is 772.  Anemia is present which is unchanged from baseline.  Electrolytes are normal and anion gap is 10 -no evidence of ketoacidosis.  Repeat CBG is pending.  3:23 AM Glucose has come down to under 200.  He is felt to be safe for discharge.  He is encouraged to take his insulin exactly as it is prescribed for him.  Final Clinical Impressions(s) / ED Diagnoses   Final diagnoses:  Hyperglycemia  Normocytic anemia  Elevated blood pressure reading with diagnosis of hypertension    ED Discharge Orders    None       Delora Fuel, MD 88/50/27 [phone]

## 2017-12-15 NOTE — ED Triage Notes (Addendum)
Patient arrived from someone's porch ( homeless) reports elevated blood sugar with generalized weakness and headache . CBG= 352 by PTAR.

## 2017-12-16 LAB — BASIC METABOLIC PANEL
Anion gap: 10 (ref 5–15)
BUN: 20 mg/dL (ref 6–20)
CO2: 29 mmol/L (ref 22–32)
Calcium: 9.7 mg/dL (ref 8.9–10.3)
Chloride: 100 mmol/L (ref 98–111)
Creatinine, Ser: 1.06 mg/dL (ref 0.61–1.24)
GFR calc Af Amer: >60 mL/min (ref >60)
GFR calc non Af Amer: >60 mL/min (ref >60)
GLUCOSE: 772 mg/dL — AB (ref 70–99)
Potassium: 3.9 mmol/L (ref 3.5–5.1)
Sodium: 139 mmol/L (ref 135–145)

## 2017-12-16 LAB — CBC WITH DIFFERENTIAL/PLATELET
Abs Immature Granulocytes: 0.01 10*3/uL (ref 0.00–0.07)
Basophils Absolute: 0 10*3/uL (ref 0.0–0.1)
Basophils Relative: 0 %
EOS ABS: 0.1 10*3/uL (ref 0.0–0.5)
Eosinophils Relative: 2 %
HCT: 33.7 % — ABNORMAL LOW (ref 39.0–52.0)
Hemoglobin: 10.5 g/dL — ABNORMAL LOW (ref 13.0–17.0)
Immature Granulocytes: 0 %
Lymphocytes Relative: 25 %
Lymphs Abs: 1 10*3/uL (ref 0.7–4.0)
MCH: 25.1 pg — ABNORMAL LOW (ref 26.0–34.0)
MCHC: 31.2 g/dL (ref 30.0–36.0)
MCV: 80.4 fL (ref 80.0–100.0)
Monocytes Absolute: 0.6 10*3/uL (ref 0.1–1.0)
Monocytes Relative: 15 %
Neutro Abs: 2.2 10*3/uL (ref 1.7–7.7)
Neutrophils Relative %: 58 %
Platelets: 275 10*3/uL (ref 150–400)
RBC: 4.19 MIL/uL — ABNORMAL LOW (ref 4.22–5.81)
RDW: 12.5 % (ref 11.5–15.5)
WBC: 3.9 10*3/uL — ABNORMAL LOW (ref 4.0–10.5)
nRBC: 0 % (ref 0.0–0.2)

## 2017-12-16 LAB — CBG MONITORING, ED: Glucose-Capillary: 183 mg/dL — ABNORMAL HIGH (ref 70–99)

## 2017-12-16 NOTE — Discharge Instructions (Signed)
Please make sure to take your insulin exactly as it is prescribed for you.

## 2017-12-22 ENCOUNTER — Emergency Department (HOSPITAL_COMMUNITY)
Admission: EM | Admit: 2017-12-22 | Discharge: 2017-12-22 | Disposition: A | Discharge status: Home / Self Care | Payer: Self-pay | Source: Home / Self Care | Attending: Emergency Medicine | Admitting: Emergency Medicine

## 2017-12-22 ENCOUNTER — Encounter (HOSPITAL_COMMUNITY): Payer: Self-pay | Admitting: Emergency Medicine

## 2017-12-22 DIAGNOSIS — E1165 Type 2 diabetes mellitus with hyperglycemia: Secondary | ICD-10-CM

## 2017-12-22 DIAGNOSIS — E1110 Type 2 diabetes mellitus with ketoacidosis without coma: Secondary | ICD-10-CM

## 2017-12-22 DIAGNOSIS — E1129 Type 2 diabetes mellitus with other diabetic kidney complication: Secondary | ICD-10-CM

## 2017-12-22 DIAGNOSIS — F17210 Nicotine dependence, cigarettes, uncomplicated: Secondary | ICD-10-CM

## 2017-12-22 DIAGNOSIS — R739 Hyperglycemia, unspecified: Secondary | ICD-10-CM

## 2017-12-22 LAB — CBC WITH DIFFERENTIAL/PLATELET
Abs Immature Granulocytes: 0.03 10*3/uL (ref 0.00–0.07)
Basophils Absolute: 0 10*3/uL (ref 0.0–0.1)
Basophils Relative: 1 %
Eosinophils Absolute: 0.1 10*3/uL (ref 0.0–0.5)
Eosinophils Relative: 2 %
HEMATOCRIT: 37.6 % — AB (ref 39.0–52.0)
HEMOGLOBIN: 11.4 g/dL — AB (ref 13.0–17.0)
Immature Granulocytes: 0 %
LYMPHS PCT: 22 %
Lymphs Abs: 1.6 10*3/uL (ref 0.7–4.0)
MCH: 24.6 pg — ABNORMAL LOW (ref 26.0–34.0)
MCHC: 30.3 g/dL (ref 30.0–36.0)
MCV: 81 fL (ref 80.0–100.0)
Monocytes Absolute: 0.5 10*3/uL (ref 0.1–1.0)
Monocytes Relative: 7 %
NEUTROS ABS: 4.9 10*3/uL (ref 1.7–7.7)
Neutrophils Relative %: 68 %
Platelets: 355 10*3/uL (ref 150–400)
RBC: 4.64 MIL/uL (ref 4.22–5.81)
RDW: 12.7 % (ref 11.5–15.5)
WBC: 7.2 10*3/uL (ref 4.0–10.5)
nRBC: 0 % (ref 0.0–0.2)

## 2017-12-22 LAB — URINALYSIS, ROUTINE W REFLEX MICROSCOPIC
BILIRUBIN URINE: NEGATIVE
Glucose, UA: >=500 mg/dL — AB
Ketones, ur: 20 mg/dL — AB
Leukocytes, UA: NEGATIVE
Nitrite: NEGATIVE
Protein, ur: 30 mg/dL — AB
Specific Gravity, Urine: 1.028 (ref 1.005–1.030)
pH: 5 (ref 5.0–8.0)

## 2017-12-22 LAB — CBG MONITORING, ED
Glucose-Capillary: >600 mg/dL (ref 70–99)
Glucose-Capillary: 300 mg/dL — ABNORMAL HIGH (ref 70–99)

## 2017-12-22 LAB — BASIC METABOLIC PANEL
Anion gap: 13 (ref 5–15)
BUN: 25 mg/dL — ABNORMAL HIGH (ref 6–20)
CHLORIDE: 99 mmol/L (ref 98–111)
CO2: 26 mmol/L (ref 22–32)
Calcium: 9.4 mg/dL (ref 8.9–10.3)
Creatinine, Ser: 1.12 mg/dL (ref 0.61–1.24)
GFR calc Af Amer: >60 mL/min (ref >60)
GFR calc non Af Amer: >60 mL/min (ref >60)
Glucose, Bld: 473 mg/dL — ABNORMAL HIGH (ref 70–99)
POTASSIUM: 4.4 mmol/L (ref 3.5–5.1)
Sodium: 138 mmol/L (ref 135–145)

## 2017-12-22 MED ORDER — SODIUM CHLORIDE 0.9 % IV BOLUS
1000.0000 mL | Freq: Once | INTRAVENOUS | Status: AC
  Administered 2017-12-22: 1000 mL via INTRAVENOUS

## 2017-12-22 MED ORDER — INSULIN ASPART 100 UNIT/ML ~~LOC~~ SOLN
8.0000 [IU] | Freq: Once | SUBCUTANEOUS | Status: AC
  Administered 2017-12-22: 8 [IU] via INTRAVENOUS

## 2017-12-22 NOTE — ED Provider Notes (Signed)
[place] EMERGENCY DEPARTMENT Provider Note   CSN: [id number] Arrival date & time: 12/22/17  1829     History   Chief Complaint Chief Complaint  Patient presents with  . Hyperglycemia    HPI Mike [name] is a 57 y.o. male.  HPI Patient presents with hyperglycemia.  States that sugars have been high today.  History of multiple visits for same.  History of noncompliance.  States his sugar was high today.  States that he checked his sugar yesterday and it was normal.  States he checked it today and it was high but does not know the actual number.  States he took insulin today but does not know what dose he took.  Has been urinating a lot.  No chest pain.  Patient is homeless.  Patient is difficult to get a history from. Past Medical History:  Diagnosis Date  . Anxiety   . Anxiety   . Chronic lower back pain   . Depression   . DKA (diabetic ketoacidoses) ([place]) 07/30/2016  . Hyperlipemia   . Hypertension   . Migraine    "last one was ~ 4 yr ago" (12/23/2014)  . Seizures ([place])    "related to pills for anxiety; if I don't take the pills I'm suppose to take I'll have them" (12/23/2014)  . Type II diabetes mellitus [facility])     Patient Active Problem List   Diagnosis Date Noted  . 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 Hypernatremia   . Memory difficulties   . Type 2 diabetes mellitus with hyperosmolar nonketotic hyperglycemia ([place]) 08/03/2017  . Hyperglycemia 05/26/2017  . Noncompliance 05/26/2017  . AKI (acute kidney injury) ([place]) 03/04/2017  . Malnutrition of moderate degree 02/08/2017  . DKA (diabetic ketoacidoses) ([place]) 02/07/2017  . History of stroke 02/07/2017  . Hyperphosphatemia 12/13/2016  . Evaluation by psychiatric service required 11/30/2016  . Lactic acidosis 11/18/2016  . Anemia 11/06/2016  . HLD (hyperlipidemia) 11/06/2016  . Adjustment disorder with other symptoms 11/05/2016  . Uncontrolled type 2 diabetes mellitus with  hyperosmolar nonketotic hyperglycemia ([place]) 08/24/2016  . Essential hypertension 08/24/2016  . Acute ischemic stroke ([place])   . Protein-calorie malnutrition, severe 12/24/2014  . Homelessness 12/23/2014  . Hypertensive urgency 12/23/2014  . DM (diabetes mellitus) ([place]) 12/23/2014  . Intermittent palpitations 12/23/2014  . Tobacco abuse 12/23/2014  . Pleuritic chest pain 12/23/2014  . Abnormal EKG 12/23/2014  . Type II diabetes mellitus with renal manifestations [facility])     Past Surgical History:  Procedure Laterality Date  . NO PAST SURGERIES          Home Medications    Prior to Admission medications   Medication Sig Start Date End Date Taking? Authorizing Provider  amLODipine (NORVASC) 10 MG tablet Take 1 tablet (10 mg total) by mouth daily. Patient not taking: Reported on 12/13/2017 05/28/17   Dessa Phi, DO  atorvastatin (LIPITOR) 40 MG tablet Take 1 tablet (40 mg total) by mouth daily. Patient not taking: Reported on 12/13/2017 01/26/17   Fatima Blank, MD  blood glucose meter kit and supplies KIT Dispense based on patient and insurance preference. Use up to four times daily as directed. (FOR ICD-9 250.00, 250.01). Patient not taking: Reported on 12/13/2017 11/22/16   Velvet Bathe, MD  blood glucose meter kit and supplies KIT Dispense based on patient and insurance preference. Use up to four times daily as directed. (FOR ICD-9 250.00, 250.01). Patient not taking: Reported on 12/13/2017 08/16/17   Aileen Fass,  Tammi Klippel, MD  ferrous sulfate 325 (65 FE) MG tablet Take 1 tablet (325 mg total) daily by mouth. Patient not taking: Reported on 12/13/2017 11/23/16   Ward, Delice Bison, DO  insulin aspart (NOVOLOG) 100 UNIT/ML injection Inject 8 Units into the skin 3 (three) times daily with meals. Patient not taking: Reported on 12/13/2017 10/04/17   Julianne Rice, MD  insulin glargine (LANTUS) 100 UNIT/ML injection Inject 0.22 mLs (22 Units total) into the skin every  morning. Patient not taking: Reported on 12/13/2017 10/04/17   Julianne Rice, MD  Insulin Pen Needle 31G X 6 MM MISC 1 Device by Does not apply route 2 (two) times daily. Patient not taking: Reported on 12/13/2017 08/16/17   Charlynne Cousins, MD  lisinopril (PRINIVIL,ZESTRIL) 40 MG tablet Take 1 tablet (40 mg total) by mouth daily. Patient not taking: Reported on 12/13/2017 04/30/17   Dorie Rank, MD  metoprolol tartrate (LOPRESSOR) 25 MG tablet Take 0.5 tablets (12.5 mg total) by mouth 2 (two) times daily. Patient not taking: Reported on 12/13/2017 03/07/17   Georgette Shell, MD    Family History Family History  Problem Relation Age of Onset  . Diabetes Mellitus II Mother   . Diabetes Mellitus II Father     Social History Social History   Tobacco Use  . Smoking status: Current Every Day Smoker    Packs/day: 0.10    Years: 40.00    Pack years: 4.00    Types: Cigarettes  . Smokeless tobacco: Never Used  Substance Use Topics  . Alcohol use: Yes    Alcohol/week: 2.0 standard drinks    Types: 2 Cans of beer per week    Comment: No labs available at time of assessment  . Drug use: No    Comment: 12/23/2014 "stopped ~ 10 yrs ago"     Allergies   Ibuprofen and Tylenol [acetaminophen]   Review of Systems Review of Systems  Constitutional: Negative for appetite change.  HENT: Negative for congestion.   Respiratory: Negative for shortness of breath.   Cardiovascular: Negative for chest pain.  Gastrointestinal: Negative for abdominal pain.  Endocrine: Positive for polyuria.  Genitourinary: Negative for flank pain.  Musculoskeletal: Negative for back pain.  Skin: Negative for rash.  Neurological: Negative for weakness.  Psychiatric/Behavioral: Positive for confusion.     Physical Exam Updated Vital Signs BP (!) 170/99   Pulse 90   Temp 98.3 F (36.8 C) (Oral)   Ht 6' 2" (1.88 m)   Wt 77.1 kg   SpO2 100%   BMI 21.82 kg/m   Physical Exam  Constitutional:  He appears well-developed.  HENT:  Head: Normocephalic.  Eyes: Right eye exhibits no discharge. Left eye exhibits no discharge.  Neck: Neck supple.  Cardiovascular: Normal rate.  Abdominal: There is no tenderness.  Genitourinary:  Genitourinary Comments: Patient smells of urine  Musculoskeletal: He exhibits no tenderness.  Neurological: He is alert.  Skin: Skin is warm. Capillary refill takes less than 2 seconds.     ED Treatments / Results  Labs (all labs ordered are listed, but only abnormal results are displayed) Labs Reviewed  CBC WITH DIFFERENTIAL/PLATELET - Abnormal; Notable for the following components:      Result Value   Hemoglobin 11.4 (*)    HCT 37.6 (*)    MCH 24.6 (*)    All other components within normal limits  BASIC METABOLIC PANEL - Abnormal; Notable for the following components:   Glucose, Bld 473 (*)    BUN  25 (*)    All other components within normal limits  URINALYSIS, ROUTINE W REFLEX MICROSCOPIC - Abnormal; Notable for the following components:   Glucose, UA >=500 (*)    Hgb urine dipstick SMALL (*)    Ketones, ur 20 (*)    Protein, ur 30 (*)    Bacteria, UA RARE (*)    All other components within normal limits  CBG MONITORING, ED - Abnormal; Notable for the following components:   Glucose-Capillary >600 (*)    All other components within normal limits  CBG MONITORING, ED - Abnormal; Notable for the following components:   Glucose-Capillary 300 (*)    All other components within normal limits    EKG None  Radiology No results found.  Procedures Procedures (including critical care time)  Medications Ordered in ED Medications  sodium chloride 0.9 % bolus 1,000 mL (0 mLs Intravenous Stopped 12/22/17 2142)  insulin aspart (novoLOG) injection 8 Units (8 Units Intravenous Given 12/22/17 2017)     Initial Impression / Assessment and Plan / ED Course  I have reviewed the triage vital signs and the nursing notes.  Pertinent labs & imaging  results that were available during my care of the patient were reviewed by me and considered in my medical decision making (see chart for details).     Patient with hyperglycemia.  History of same.  History of noncompliance.  Not in DKA.  Sugar lowered.  Discharge home.  Final Clinical Impressions(s) / ED Diagnoses   Final diagnoses:  Hyperglycemia    ED Discharge Orders    None       Davonna Belling, MD 12/23/17 0010

## 2017-12-22 NOTE — ED Triage Notes (Signed)
Pt noncompliant diabetic pt is homeless. C/o polyuria, denies pain

## 2017-12-23 ENCOUNTER — Encounter (HOSPITAL_COMMUNITY): Payer: Self-pay | Admitting: Emergency Medicine

## 2017-12-23 ENCOUNTER — Inpatient Hospital Stay (HOSPITAL_COMMUNITY)
Admission: EM | Admit: 2017-12-23 | Discharge: 2017-12-26 | DRG: 637 | Disposition: A | Discharge status: Home / Self Care | Payer: Self-pay | Attending: Internal Medicine | Admitting: Internal Medicine

## 2017-12-23 ENCOUNTER — Emergency Department (HOSPITAL_COMMUNITY): Payer: Self-pay

## 2017-12-23 ENCOUNTER — Other Ambulatory Visit: Payer: Self-pay

## 2017-12-23 DIAGNOSIS — R680 Hypothermia, not associated with low environmental temperature: Secondary | ICD-10-CM | POA: Diagnosis present

## 2017-12-23 DIAGNOSIS — Z79899 Other long term (current) drug therapy: Secondary | ICD-10-CM

## 2017-12-23 DIAGNOSIS — E0910 Drug or chemical induced diabetes mellitus with ketoacidosis without coma: Secondary | ICD-10-CM

## 2017-12-23 DIAGNOSIS — Z9114 Patient's other noncompliance with medication regimen: Secondary | ICD-10-CM

## 2017-12-23 DIAGNOSIS — E0810 Diabetes mellitus due to underlying condition with ketoacidosis without coma: Secondary | ICD-10-CM

## 2017-12-23 DIAGNOSIS — G9341 Metabolic encephalopathy: Secondary | ICD-10-CM | POA: Diagnosis present

## 2017-12-23 DIAGNOSIS — I10 Essential (primary) hypertension: Secondary | ICD-10-CM | POA: Diagnosis present

## 2017-12-23 DIAGNOSIS — Z590 Homelessness: Secondary | ICD-10-CM

## 2017-12-23 DIAGNOSIS — Z886 Allergy status to analgesic agent status: Secondary | ICD-10-CM

## 2017-12-23 DIAGNOSIS — Z833 Family history of diabetes mellitus: Secondary | ICD-10-CM

## 2017-12-23 DIAGNOSIS — E1010 Type 1 diabetes mellitus with ketoacidosis without coma: Secondary | ICD-10-CM

## 2017-12-23 DIAGNOSIS — F17210 Nicotine dependence, cigarettes, uncomplicated: Secondary | ICD-10-CM | POA: Diagnosis present

## 2017-12-23 DIAGNOSIS — I959 Hypotension, unspecified: Secondary | ICD-10-CM

## 2017-12-23 DIAGNOSIS — N179 Acute kidney failure, unspecified: Secondary | ICD-10-CM | POA: Diagnosis present

## 2017-12-23 DIAGNOSIS — Z9119 Patient's noncompliance with other medical treatment and regimen: Secondary | ICD-10-CM

## 2017-12-23 DIAGNOSIS — I9589 Other hypotension: Secondary | ICD-10-CM | POA: Diagnosis present

## 2017-12-23 DIAGNOSIS — E861 Hypovolemia: Secondary | ICD-10-CM | POA: Diagnosis present

## 2017-12-23 DIAGNOSIS — T68XXXA Hypothermia, initial encounter: Secondary | ICD-10-CM | POA: Insufficient documentation

## 2017-12-23 DIAGNOSIS — E1110 Type 2 diabetes mellitus with ketoacidosis without coma: Principal | ICD-10-CM | POA: Diagnosis present

## 2017-12-23 DIAGNOSIS — E109 Type 1 diabetes mellitus without complications: Secondary | ICD-10-CM | POA: Diagnosis present

## 2017-12-23 DIAGNOSIS — E785 Hyperlipidemia, unspecified: Secondary | ICD-10-CM | POA: Diagnosis present

## 2017-12-23 DIAGNOSIS — Z8673 Personal history of transient ischemic attack (TIA), and cerebral infarction without residual deficits: Secondary | ICD-10-CM

## 2017-12-23 DIAGNOSIS — Z794 Long term (current) use of insulin: Secondary | ICD-10-CM

## 2017-12-23 LAB — BASIC METABOLIC PANEL
Anion gap: 20 — ABNORMAL HIGH (ref 5–15)
Anion gap: 13 (ref 5–15)
Anion gap: 1 — ABNORMAL LOW (ref 5–15)
Anion gap: 7 (ref 5–15)
BUN: 34 mg/dL — ABNORMAL HIGH (ref 6–20)
BUN: 32 mg/dL — ABNORMAL HIGH (ref 6–20)
BUN: 25 mg/dL — ABNORMAL HIGH (ref 6–20)
BUN: 21 mg/dL — ABNORMAL HIGH (ref 6–20)
CO2: 17 mmol/L — ABNORMAL LOW (ref 22–32)
CO2: 18 mmol/L — ABNORMAL LOW (ref 22–32)
CO2: 23 mmol/L (ref 22–32)
CO2: 24 mmol/L (ref 22–32)
Calcium: 8.8 mg/dL — ABNORMAL LOW (ref 8.9–10.3)
Calcium: 8.5 mg/dL — ABNORMAL LOW (ref 8.9–10.3)
Calcium: 8.2 mg/dL — ABNORMAL LOW (ref 8.9–10.3)
Calcium: 8.6 mg/dL — ABNORMAL LOW (ref 8.9–10.3)
Chloride: 107 mmol/L (ref 98–111)
Chloride: 113 mmol/L — ABNORMAL HIGH (ref 98–111)
Chloride: 118 mmol/L — ABNORMAL HIGH (ref 98–111)
Chloride: 113 mmol/L — ABNORMAL HIGH (ref 98–111)
Creatinine, Ser: 1.47 mg/dL — ABNORMAL HIGH (ref 0.61–1.24)
Creatinine, Ser: 1.24 mg/dL (ref 0.61–1.24)
Creatinine, Ser: 0.94 mg/dL (ref 0.61–1.24)
Creatinine, Ser: 0.91 mg/dL (ref 0.61–1.24)
GFR calc Af Amer: >60 mL/min (ref >60)
GFR calc Af Amer: >60 mL/min (ref >60)
GFR calc Af Amer: >60 mL/min (ref >60)
GFR calc Af Amer: >60 mL/min (ref >60)
GFR calc non Af Amer: 53 mL/min — ABNORMAL LOW (ref >60)
GFR calc non Af Amer: >60 mL/min (ref >60)
GFR calc non Af Amer: >60 mL/min (ref >60)
GFR calc non Af Amer: >60 mL/min (ref >60)
Glucose, Bld: 600 mg/dL (ref 70–99)
Glucose, Bld: 383 mg/dL — ABNORMAL HIGH (ref 70–99)
Glucose, Bld: 175 mg/dL — ABNORMAL HIGH (ref 70–99)
Glucose, Bld: 117 mg/dL — ABNORMAL HIGH (ref 70–99)
Potassium: 4.5 mmol/L (ref 3.5–5.1)
Potassium: 4.2 mmol/L (ref 3.5–5.1)
Potassium: 3.7 mmol/L (ref 3.5–5.1)
Potassium: 3.7 mmol/L (ref 3.5–5.1)
Sodium: 144 mmol/L (ref 135–145)
Sodium: 144 mmol/L (ref 135–145)
Sodium: 142 mmol/L (ref 135–145)
Sodium: 144 mmol/L (ref 135–145)

## 2017-12-23 LAB — GLUCOSE, CAPILLARY
GLUCOSE-CAPILLARY: 325 mg/dL — AB (ref 70–99)
Glucose-Capillary: 463 mg/dL — ABNORMAL HIGH (ref 70–99)
Glucose-Capillary: 363 mg/dL — ABNORMAL HIGH (ref 70–99)
Glucose-Capillary: 245 mg/dL — ABNORMAL HIGH (ref 70–99)
Glucose-Capillary: 223 mg/dL — ABNORMAL HIGH (ref 70–99)
Glucose-Capillary: 243 mg/dL — ABNORMAL HIGH (ref 70–99)
Glucose-Capillary: 151 mg/dL — ABNORMAL HIGH (ref 70–99)
Glucose-Capillary: 124 mg/dL — ABNORMAL HIGH (ref 70–99)
Glucose-Capillary: 95 mg/dL (ref 70–99)
Glucose-Capillary: 101 mg/dL — ABNORMAL HIGH (ref 70–99)

## 2017-12-23 LAB — CBC WITH DIFFERENTIAL/PLATELET
Abs Immature Granulocytes: 0.09 10*3/uL — ABNORMAL HIGH (ref 0.00–0.07)
Basophils Absolute: 0.1 10*3/uL (ref 0.0–0.1)
Basophils Relative: 1 %
Eosinophils Absolute: 0.1 10*3/uL (ref 0.0–0.5)
Eosinophils Relative: 1 %
HCT: 38.6 % — ABNORMAL LOW (ref 39.0–52.0)
HEMOGLOBIN: 11.2 g/dL — AB (ref 13.0–17.0)
Immature Granulocytes: 1 %
LYMPHS ABS: 4.2 10*3/uL — AB (ref 0.7–4.0)
Lymphocytes Relative: 38 %
MCH: 24.5 pg — ABNORMAL LOW (ref 26.0–34.0)
MCHC: 29 g/dL — AB (ref 30.0–36.0)
MCV: 84.5 fL (ref 80.0–100.0)
Monocytes Absolute: 0.5 10*3/uL (ref 0.1–1.0)
Monocytes Relative: 5 %
Neutro Abs: 6.2 10*3/uL (ref 1.7–7.7)
Neutrophils Relative %: 54 %
Platelets: 435 10*3/uL — ABNORMAL HIGH (ref 150–400)
RBC: 4.57 MIL/uL (ref 4.22–5.81)
RDW: 13.2 % (ref 11.5–15.5)
WBC: 11.1 10*3/uL — ABNORMAL HIGH (ref 4.0–10.5)
nRBC: 0 % (ref 0.0–0.2)

## 2017-12-23 LAB — I-STAT CG4 LACTIC ACID, ED
Lactic Acid, Venous: 8.84 mmol/L (ref 0.5–1.9)
Lactic Acid, Venous: 3.17 mmol/L (ref 0.5–1.9)

## 2017-12-23 LAB — I-STAT VENOUS BLOOD GAS, ED
Acid-base deficit: 11 mmol/L — ABNORMAL HIGH (ref 0.0–2.0)
BICARBONATE: 15.9 mmol/L — AB (ref 20.0–28.0)
O2 Saturation: 60 %
PH VEN: 7.219 — AB (ref 7.250–7.430)
TCO2: 17 mmol/L — AB (ref 22–32)
pCO2, Ven: 38.9 mmHg — ABNORMAL LOW (ref 44.0–60.0)
pO2, Ven: 37 mmHg (ref 32.0–45.0)

## 2017-12-23 LAB — COMPREHENSIVE METABOLIC PANEL
ALT: 22 U/L (ref 0–44)
AST: 23 U/L (ref 15–41)
Albumin: 3.3 g/dL — ABNORMAL LOW (ref 3.5–5.0)
Alkaline Phosphatase: 108 U/L (ref 38–126)
Anion gap: 27 — ABNORMAL HIGH (ref 5–15)
BUN: 36 mg/dL — ABNORMAL HIGH (ref 6–20)
CO2: 16 mmol/L — ABNORMAL LOW (ref 22–32)
Calcium: 9.7 mg/dL (ref 8.9–10.3)
Chloride: 98 mmol/L (ref 98–111)
Creatinine, Ser: 1.6 mg/dL — ABNORMAL HIGH (ref 0.61–1.24)
GFR calc Af Amer: 55 mL/min — ABNORMAL LOW (ref >60)
GFR calc non Af Amer: 47 mL/min — ABNORMAL LOW (ref >60)
Glucose, Bld: 601 mg/dL (ref 70–99)
POTASSIUM: 3.8 mmol/L (ref 3.5–5.1)
Sodium: 141 mmol/L (ref 135–145)
Total Bilirubin: 0.9 mg/dL (ref 0.3–1.2)
Total Protein: 7.4 g/dL (ref 6.5–8.1)

## 2017-12-23 LAB — I-STAT ARTERIAL BLOOD GAS, ED
ACID-BASE DEFICIT: 12 mmol/L — AB (ref 0.0–2.0)
Bicarbonate: 13.4 mmol/L — ABNORMAL LOW (ref 20.0–28.0)
O2 Saturation: 98 %
PO2 ART: 113 mmHg — AB (ref 83.0–108.0)
Patient temperature: 98.6
TCO2: 14 mmol/L — ABNORMAL LOW (ref 22–32)
pCO2 arterial: 27.9 mmHg — ABNORMAL LOW (ref 32.0–48.0)
pH, Arterial: 7.288 — ABNORMAL LOW (ref 7.350–7.450)

## 2017-12-23 LAB — I-STAT CHEM 8, ED
BUN: 37 mg/dL — ABNORMAL HIGH (ref 6–20)
Calcium, Ion: 1.18 mmol/L (ref 1.15–1.40)
Chloride: 107 mmol/L (ref 98–111)
Creatinine, Ser: 1.1 mg/dL (ref 0.61–1.24)
Glucose, Bld: 626 mg/dL (ref 70–99)
HEMATOCRIT: 39 % (ref 39.0–52.0)
Hemoglobin: 13.3 g/dL (ref 13.0–17.0)
Potassium: 3.8 mmol/L (ref 3.5–5.1)
Sodium: 140 mmol/L (ref 135–145)
TCO2: 21 mmol/L — ABNORMAL LOW (ref 22–32)

## 2017-12-23 LAB — I-STAT TROPONIN, ED: Troponin i, poc: 0 ng/mL (ref 0.00–0.08)

## 2017-12-23 LAB — CBG MONITORING, ED
GLUCOSE-CAPILLARY: 546 mg/dL — AB (ref 70–99)
Glucose-Capillary: 559 mg/dL (ref 70–99)

## 2017-12-23 LAB — MRSA PCR SCREENING: MRSA by PCR: NEGATIVE

## 2017-12-23 MED ORDER — SODIUM CHLORIDE 0.9 % IV BOLUS
1000.0000 mL | Freq: Once | INTRAVENOUS | Status: AC
  Administered 2017-12-23: 1000 mL via INTRAVENOUS

## 2017-12-23 MED ORDER — DEXTROSE 50 % IV SOLN: 25.0000 mL | INTRAVENOUS | Status: DC | PRN

## 2017-12-23 MED ORDER — INSULIN REGULAR BOLUS VIA INFUSION
0.0000 [IU] | Freq: Three times a day (TID) | INTRAVENOUS | Status: DC
  Filled 2017-12-23: qty 10

## 2017-12-23 MED ORDER — SODIUM CHLORIDE 0.9 % IV SOLN
INTRAVENOUS | Status: DC | PRN
  Administered 2017-12-23: 250 mL via INTRAVENOUS

## 2017-12-23 MED ORDER — INSULIN ASPART 100 UNIT/ML ~~LOC~~ SOLN
0.0000 [IU] | Freq: Three times a day (TID) | SUBCUTANEOUS | Status: DC
  Administered 2017-12-24: 8 [IU] via SUBCUTANEOUS
  Administered 2017-12-24: 11 [IU] via SUBCUTANEOUS
  Administered 2017-12-25 (×2): 15 [IU] via SUBCUTANEOUS
  Administered 2017-12-25: 3 [IU] via SUBCUTANEOUS

## 2017-12-23 MED ORDER — VANCOMYCIN HCL IN DEXTROSE 1-5 GM/200ML-% IV SOLN
1000.0000 mg | Freq: Once | INTRAVENOUS | Status: AC
  Administered 2017-12-23: 1000 mg via INTRAVENOUS
  Filled 2017-12-23: qty 200

## 2017-12-23 MED ORDER — DEXTROSE-NACL 5-0.45 % IV SOLN: INTRAVENOUS | Status: DC

## 2017-12-23 MED ORDER — SODIUM CHLORIDE 0.9 % IV SOLN: INTRAVENOUS | Status: DC

## 2017-12-23 MED ORDER — INSULIN REGULAR(HUMAN) IN NACL 100-0.9 UT/100ML-% IV SOLN: INTRAVENOUS | Status: DC

## 2017-12-23 MED ORDER — POTASSIUM CHLORIDE 10 MEQ/100ML IV SOLN
10.0000 meq | INTRAVENOUS | Status: AC
Start: 2017-12-23 — End: 2017-12-23
  Administered 2017-12-23 (×2): 10 meq via INTRAVENOUS
  Filled 2017-12-23 (×2): qty 100

## 2017-12-23 MED ORDER — INSULIN REGULAR(HUMAN) IN NACL 100-0.9 UT/100ML-% IV SOLN
INTRAVENOUS | Status: DC
  Administered 2017-12-23: 5 [IU]/h via INTRAVENOUS
  Filled 2017-12-23: qty 100

## 2017-12-23 MED ORDER — SODIUM CHLORIDE 0.9 % IV SOLN
2.0000 g | Freq: Once | INTRAVENOUS | Status: AC
  Administered 2017-12-23: 2 g via INTRAVENOUS
  Filled 2017-12-23: qty 2

## 2017-12-23 MED ORDER — NOREPINEPHRINE 4 MG/250ML-% IV SOLN
0.0000 ug/min | INTRAVENOUS | Status: DC
  Administered 2017-12-23: 2 ug/min via INTRAVENOUS
  Filled 2017-12-23: qty 250

## 2017-12-23 MED ORDER — METRONIDAZOLE IN NACL 5-0.79 MG/ML-% IV SOLN
500.0000 mg | Freq: Three times a day (TID) | INTRAVENOUS | Status: DC
  Administered 2017-12-23: 500 mg via INTRAVENOUS
  Filled 2017-12-23: qty 100

## 2017-12-23 MED ORDER — INSULIN DETEMIR 100 UNIT/ML ~~LOC~~ SOLN
15.0000 [IU] | SUBCUTANEOUS | Status: DC
  Administered 2017-12-23 – 2017-12-24 (×2): 15 [IU] via SUBCUTANEOUS
  Filled 2017-12-23 (×3): qty 0.15

## 2017-12-23 MED ORDER — LACTATED RINGERS IV BOLUS
2000.0000 mL | Freq: Once | INTRAVENOUS | Status: AC
  Administered 2017-12-23: 2000 mL via INTRAVENOUS

## 2017-12-23 MED ORDER — DEXTROSE-NACL 5-0.45 % IV SOLN
INTRAVENOUS | Status: DC
  Administered 2017-12-23: 14:00:00 via INTRAVENOUS

## 2017-12-23 MED ORDER — SODIUM CHLORIDE 0.9 % IV SOLN
INTRAVENOUS | Status: DC
  Administered 2017-12-23: 07:00:00 via INTRAVENOUS

## 2017-12-23 NOTE — ED Triage Notes (Signed)
Pt found by bystanders crawling in the road. Pt reports he tripped and fell.

## 2017-12-23 NOTE — ED Notes (Signed)
Dr Glick informed of chem 8 results 

## 2017-12-23 NOTE — ED Notes (Signed)
Pt awake ,pulled off condom cath, Heart  Rate back into a SR  , B/p increased to 150 , Ekg shot and placed in chart

## 2017-12-23 NOTE — H&P (Signed)
PULMONARY / CRITICAL CARE MEDICINE   NAME:  Mike [name], MRN:  [id number], DOB:  1960-12-26, LOS: 0 ADMISSION DATE:  12/23/2017, CONSULTATION DATE:  12/23/2017 REFERRING MD:  ED physician , CHIEF COMPLAINT: hyperglycemia and weakness   BRIEF HISTORY:    57 year old male homeless with noncompliance to his diabetes medicine coming in with altered mental status and DKA with hypotension.   HISTORY OF PRESENT ILLNESS   Mike [name] is a 57 year old male who is homeless with known type 2 diabetes mellitus hypertension hyperlipidemia who was in the emergency last night for hypoglycemia and was discharged since patient was not in DKA after managing his hyperglycemia he was shortly found by bystanders crawling outside of the hospital he states that he tripped and fell but not able to get much detail from him since his altered mental status patient was found to be hypotensive hypothermic with temperature of 89 and systolic blood pressure of 80 with severe acidosis lactic acid of 8.  Patient received almost 3 L of IV fluids and is on Levophed 2 mics and with a bear hugger.  Patient denies any fever no vomiting no diarrhea and no cough no sputum.  Patient denies any abdominal pain no chest pain no shortness of breath.  He does not take anything for his diabetes and has multiple visits to the emergency room with hyperglycemia.  SIGNIFICANT PAST MEDICAL HISTORY   -Type 2 diabetes mellitus -Noncompliance  SIGNIFICANT EVENTS:   STUDIES:   CT head>>  CULTURES:    ANTIBIOTICS:  Received cefepime and vancomycin in the ED  LINES/TUBES:    CONSULTANTS:   SUBJECTIVE:   CONSTITUTIONAL: BP (!) 80/60   Pulse 60   Temp (S) (!) 89.4 F (31.9 C) (Rectal)   Resp 15   SpO2 100%   No intake/output data recorded.        PHYSICAL EXAM: General: Acutely ill hypothermic lethargic Neuro: Lethargic but does answer simple questions oriented to place with moving all extremities no focal weakness HEENT:  Dry mucous membranes Cardiovascular: Normal heart sound no added sounds or murmurs Lungs: Clear equal air sound bilaterally no crackles no wheezing  abdomen: Soft no tenderness no organomegaly no guarding Musculoskeletal: No lower limb edema Skin: No skin rash  RESOLVED PROBLEM LIST   ASSESSMENT AND PLAN   Assessment: -Diabetic ketoacidosis -Acute metabolic encephalopathy -Acute kidney injury -Hypothermia -Hypotension secondary to hypovolemia -Lactic acidosis   Plan: -Patient received 3 L of IV fluid he is on 2 mcg of Levophed I will bolus with another 2 L of LR -Start insulin drip and titrate as per DKA protocol -Stat ABG -Patient received antibiotics he denies any signs or sources of infection I will hold off on continuing antibiotics for now -CT scan of the head since patient had a unwitnessed fall and hard to get any history from him -SCDs for DVT prophylaxis -Bear hugger -Titrate Levophed to keep map above 65 -Repeat lactic acid   I have spent 50 minutes of critical care time this time was spent bedside or in the unit this time was exclusive any billable procedures patient is needing ICU due to hypotension requiring pressors titration  SUMMARY OF TODAY'S PLAN:    Best Practice / Goals of Care / Disposition.   DVT PROPHYLAXIS: SCD SUP: None NUTRITION: N.p.o. MOBILITY: Bedrest GOALS OF CARE: Full code FAMILY DISCUSSIONS: No family around DISPOSITION ICU  LABS  Glucose Recent Labs  Lab 12/22/17 1841 12/22/17 2109 12/23/17 0617  GLUCAP >600* 300* 546*  BMET Recent Labs  Lab 12/22/17 1905 12/23/17 0442 12/23/17 0536  NA 138 141 140  K 4.4 3.8 3.8  CL 99 98 107  CO2 26 16*  --   BUN 25* 36* 37*  CREATININE 1.12 1.60* 1.10  GLUCOSE 473* 601* 626*    Liver Enzymes Recent Labs  Lab 12/23/17 0442  AST 23  ALT 22  ALKPHOS 108  BILITOT 0.9  ALBUMIN 3.3*    Electrolytes Recent Labs  Lab 12/22/17 1905 12/23/17 0442  CALCIUM 9.4 9.7     CBC Recent Labs  Lab 12/22/17 1905 12/23/17 0442 12/23/17 0536  WBC 7.2 11.1*  --   HGB 11.4* 11.2* 13.3  HCT 37.6* 38.6* 39.0  PLT 355 435*  --     ABG No results for input(s): PHART, PCO2ART, PO2ART in the last 168 hours.  Coag's No results for input(s): APTT, INR in the last 168 hours.  Sepsis Markers Recent Labs  Lab 12/23/17 0447  LATICACIDVEN 8.84*    Cardiac Enzymes No results for input(s): TROPONINI, PROBNP in the last 168 hours.  PAST MEDICAL HISTORY :   He  has a past medical history of Anxiety, Anxiety, Chronic lower back pain, Depression, DKA (diabetic ketoacidoses) ([place]) (07/30/2016), Hyperlipemia, Hypertension, Migraine, Seizures ([place]), and Type II diabetes mellitus ([place]).  PAST SURGICAL HISTORY:  He  has a past surgical history that includes No past surgeries.  Allergies  Allergen Reactions  . Ibuprofen Nausea And Vomiting and Other (See Comments)    Makes the patient feel bloated also  . Tylenol [Acetaminophen] Nausea And Vomiting and Other (See Comments)    Makes the patient feel bloated also    No current facility-administered medications on file prior to encounter.    Current Outpatient Medications on File Prior to Encounter  Medication Sig  . amLODipine (NORVASC) 10 MG tablet Take 1 tablet (10 mg total) by mouth daily. (Patient not taking: Reported on 12/13/2017)  . atorvastatin (LIPITOR) 40 MG tablet Take 1 tablet (40 mg total) by mouth daily. (Patient not taking: Reported on 12/13/2017)  . blood glucose meter kit and supplies KIT Dispense based on patient and insurance preference. Use up to four times daily as directed. (FOR ICD-9 250.00, 250.01). (Patient not taking: Reported on 12/13/2017)  . blood glucose meter kit and supplies KIT Dispense based on patient and insurance preference. Use up to four times daily as directed. (FOR ICD-9 250.00, 250.01). (Patient not taking: Reported on 12/13/2017)  . ferrous sulfate 325 (65 FE) MG tablet  Take 1 tablet (325 mg total) daily by mouth. (Patient not taking: Reported on 12/13/2017)  . insulin aspart (NOVOLOG) 100 UNIT/ML injection Inject 8 Units into the skin 3 (three) times daily with meals. (Patient not taking: Reported on 12/13/2017)  . insulin glargine (LANTUS) 100 UNIT/ML injection Inject 0.22 mLs (22 Units total) into the skin every morning. (Patient not taking: Reported on 12/13/2017)  . Insulin Pen Needle 31G X 6 MM MISC 1 Device by Does not apply route 2 (two) times daily. (Patient not taking: Reported on 12/13/2017)  . lisinopril (PRINIVIL,ZESTRIL) 40 MG tablet Take 1 tablet (40 mg total) by mouth daily. (Patient not taking: Reported on 12/13/2017)  . metoprolol tartrate (LOPRESSOR) 25 MG tablet Take 0.5 tablets (12.5 mg total) by mouth 2 (two) times daily. (Patient not taking: Reported on 12/13/2017)    FAMILY HISTORY:   His family history includes Diabetes Mellitus II in his father and mother.  SOCIAL HISTORY:  He  reports  that he has been smoking cigarettes. He has a 4.00 pack-year smoking history. He has never used smokeless tobacco. He reports that he drinks about 2.0 standard drinks of alcohol per week. He reports that he does not use drugs.  REVIEW OF SYSTEMS:    All 11 point system review were unremarkable other than what is mentioned history of present illness

## 2017-12-23 NOTE — ED Notes (Signed)
Dr Preston FleetingGlick informed of lactic acid results 8.84

## 2017-12-23 NOTE — ED Notes (Signed)
Report called  

## 2017-12-23 NOTE — Progress Notes (Signed)
PCCM:  Notified by nursing that patient's gap is closed at 13.  Patient wanting to eat.  DKA phase 2 order set entered.  Will transition off insulin drip.  Josephine IgoBradley L Trygg Mantz, DO [facility] 12/23/2017 3:31 PM  Personal pager: [phone] If unanswered, please page CCM On-call: #[phone]

## 2017-12-23 NOTE — ED Notes (Signed)
Unable to obtain oral or axillary temp on pt. Pt has been outside since approx 2130 tonight. Charge RN aware.

## 2017-12-23 NOTE — ED Provider Notes (Addendum)
[facility] EMERGENCY DEPARTMENT Provider Note   CSN: [id number] Arrival date & time: 12/23/17  0355   History   Chief Complaint Chief Complaint  Patient presents with  . Fall    HPI Mike [name] is a 57 y.o. male with a PMH of Diabetes and HTN presents after a fall onset an hour ago. Patient was found by bystanders crawling outside the hospital. Patient states he tripped and fell. Pt is unclear if he hit his head. Patient was evaluated last night at [facility] for hyperglycemia. Patient is non compliant with his medications and has multiple visits to the emergency department. Patient is homeless. Patient is a poor historian due to mental status.   Level 5 caveat.   HPI  Past Medical History:  Diagnosis Date  . Anxiety   . Anxiety   . Chronic lower back pain   . Depression   . DKA (diabetic ketoacidoses) ([place]) 07/30/2016  . Hyperlipemia   . Hypertension   . Migraine    "last one was ~ 4 yr ago" (12/23/2014)  . Seizures ([place])    "related to pills for anxiety; if I don't take the pills I'm suppose to take I'll have them" (12/23/2014)  . Type II diabetes mellitus [facility])     Patient Active Problem List   Diagnosis Date Noted  . 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 Hypernatremia   . Memory difficulties   . Type 2 diabetes mellitus with hyperosmolar nonketotic hyperglycemia ([place]) 08/03/2017  . Hyperglycemia 05/26/2017  . Noncompliance 05/26/2017  . AKI (acute kidney injury) ([place]) 03/04/2017  . Malnutrition of moderate degree 02/08/2017  . DKA (diabetic ketoacidoses) ([place]) 02/07/2017  . History of stroke 02/07/2017  . Hyperphosphatemia 12/13/2016  . Evaluation by psychiatric service required 11/30/2016  . Lactic acidosis 11/18/2016  . Anemia 11/06/2016  . HLD (hyperlipidemia) 11/06/2016  . Adjustment disorder with other symptoms 11/05/2016  . Uncontrolled type 2 diabetes mellitus with hyperosmolar nonketotic hyperglycemia ([place]) 08/24/2016    . Essential hypertension 08/24/2016  . Acute ischemic stroke ([place])   . Protein-calorie malnutrition, severe 12/24/2014  . Homelessness 12/23/2014  . Hypertensive urgency 12/23/2014  . DM (diabetes mellitus) ([place]) 12/23/2014  . Intermittent palpitations 12/23/2014  . Tobacco abuse 12/23/2014  . Pleuritic chest pain 12/23/2014  . Abnormal EKG 12/23/2014  . Type II diabetes mellitus with renal manifestations [facility])     Past Surgical History:  Procedure Laterality Date  . NO PAST SURGERIES          Home Medications    Prior to Admission medications   Medication Sig Start Date End Date Taking? Authorizing Provider  amLODipine (NORVASC) 10 MG tablet Take 1 tablet (10 mg total) by mouth daily. Patient not taking: Reported on 12/13/2017 05/28/17   Dessa Phi, DO  atorvastatin (LIPITOR) 40 MG tablet Take 1 tablet (40 mg total) by mouth daily. Patient not taking: Reported on 12/13/2017 01/26/17   Fatima Blank, MD  blood glucose meter kit and supplies KIT Dispense based on patient and insurance preference. Use up to four times daily as directed. (FOR ICD-9 250.00, 250.01). Patient not taking: Reported on 12/13/2017 11/22/16   Velvet Bathe, MD  blood glucose meter kit and supplies KIT Dispense based on patient and insurance preference. Use up to four times daily as directed. (FOR ICD-9 250.00, 250.01). Patient not taking: Reported on 12/13/2017 08/16/17   Charlynne Cousins, MD  ferrous sulfate 325 (65 FE) MG tablet Take 1 tablet (325 mg  total) daily by mouth. Patient not taking: Reported on 12/13/2017 11/23/16   Ward, Delice Bison, DO  insulin aspart (NOVOLOG) 100 UNIT/ML injection Inject 8 Units into the skin 3 (three) times daily with meals. Patient not taking: Reported on 12/13/2017 10/04/17   Julianne Rice, MD  insulin glargine (LANTUS) 100 UNIT/ML injection Inject 0.22 mLs (22 Units total) into the skin every morning. Patient not taking: Reported on 12/13/2017 10/04/17    Julianne Rice, MD  Insulin Pen Needle 31G X 6 MM MISC 1 Device by Does not apply route 2 (two) times daily. Patient not taking: Reported on 12/13/2017 08/16/17   Charlynne Cousins, MD  lisinopril (PRINIVIL,ZESTRIL) 40 MG tablet Take 1 tablet (40 mg total) by mouth daily. Patient not taking: Reported on 12/13/2017 04/30/17   Dorie Rank, MD  metoprolol tartrate (LOPRESSOR) 25 MG tablet Take 0.5 tablets (12.5 mg total) by mouth 2 (two) times daily. Patient not taking: Reported on 12/13/2017 03/07/17   Georgette Shell, MD    Family History Family History  Problem Relation Age of Onset  . Diabetes Mellitus II Mother   . Diabetes Mellitus II Father     Social History Social History   Tobacco Use  . Smoking status: Current Every Day Smoker    Packs/day: 0.10    Years: 40.00    Pack years: 4.00    Types: Cigarettes  . Smokeless tobacco: Never Used  Substance Use Topics  . Alcohol use: Yes    Alcohol/week: 2.0 standard drinks    Types: 2 Cans of beer per week    Comment: No labs available at time of assessment  . Drug use: No    Comment: 12/23/2014 "stopped ~ 10 yrs ago"     Allergies   Ibuprofen and Tylenol [acetaminophen]   Review of Systems Review of Systems  Unable to perform ROS: Mental status change     Physical Exam Updated Vital Signs BP (!) 80/60   Pulse 60   Temp (S) (!) 89.4 F (31.9 C) (Rectal)   Resp 15   SpO2 100%   Physical Exam  Constitutional: He appears well-developed and well-nourished. No distress.  HENT:  Head: Normocephalic and atraumatic. Head is without raccoon's eyes, without Battle's sign and without abrasion.  Right Ear: External ear and ear canal normal.  Left Ear: External ear and ear canal normal.  Nose: Nose normal.  Mouth/Throat: Uvula is midline and oropharynx is clear and moist. No oropharyngeal exudate or posterior oropharyngeal edema.  Bilateral TMs unable to be assessed due to cerumen.   Neck: Normal range of motion.    Cardiovascular: Regular rhythm, normal heart sounds and intact distal pulses. Tachycardia present. Exam reveals no gallop and no friction rub.  No murmur heard. Pulmonary/Chest: Effort normal and breath sounds normal. No respiratory distress. He has no wheezes. He has no rales.  Abdominal: Soft. He exhibits no distension. There is no tenderness. There is no guarding.  Musculoskeletal: Normal range of motion.  Neurological: He is alert.  Skin: No rash noted. He is not diaphoretic. No erythema.  Psychiatric: He has a normal mood and affect.  Nursing note and vitals reviewed.  Mental Status:  Pt is alert. Patient is oriented to person only. Pt is not able to give a coherent history. Pt is not able to follow 2 step commands. Cranial Nerves:  II: pupils equal, round, reactive to light III,IV, VI: ptosis not present, extra-ocular motions intact bilaterally  V,VII: smile symmetric, facial light touch sensation  equal VIII: hearing grossly normal to voice  X: uvula elevates symmetrically  XI: bilateral shoulder shrug symmetric and strong XII: midline tongue extension without fassiculations Motor:  Normal tone. 5/5 in upper and lower extremities bilaterally including strong and equal grip strength and dorsiflexion/plantar flexion Sensory: light touch normal in all extremities.  CV: distal pulses palpable throughout   ED Treatments / Results  Labs (all labs ordered are listed, but only abnormal results are displayed) Labs Reviewed  CBC WITH DIFFERENTIAL/PLATELET - Abnormal; Notable for the following components:      Result Value   WBC 11.1 (*)    Hemoglobin 11.2 (*)    HCT 38.6 (*)    MCH 24.5 (*)    MCHC 29.0 (*)    Platelets 435 (*)    Lymphs Abs 4.2 (*)    Abs Immature Granulocytes 0.09 (*)    All other components within normal limits  COMPREHENSIVE METABOLIC PANEL - Abnormal; Notable for the following components:   CO2 16 (*)    Glucose, Bld 601 (*)    BUN 36 (*)    Creatinine,  Ser 1.60 (*)    Albumin 3.3 (*)    GFR calc non Af Amer 47 (*)    GFR calc Af Amer 55 (*)    Anion gap 27 (*)    All other components within normal limits  I-STAT CHEM 8, ED - Abnormal; Notable for the following components:   BUN 37 (*)    Glucose, Bld 626 (*)    TCO2 21 (*)    All other components within normal limits  I-STAT CG4 LACTIC ACID, ED - Abnormal; Notable for the following components:   Lactic Acid, Venous 8.84 (*)    All other components within normal limits  CULTURE, BLOOD (ROUTINE X 2)  CULTURE, BLOOD (ROUTINE X 2)  URINE CULTURE  URINALYSIS, ROUTINE W REFLEX MICROSCOPIC  CBG MONITORING, ED  I-STAT TROPONIN, ED  CBG MONITORING, ED  I-STAT VENOUS BLOOD GAS, ED  I-STAT CG4 LACTIC ACID, ED    EKG EKG Interpretation  Date/Time:  Sunday December 23 2017 38:10:17 EST Ventricular Rate:  158 PR Interval:    QRS Duration: 90 QT Interval:  306 QTC Calculation: 497 R Axis:   88 Text Interpretation:  Supraventricular tachycardia LVH with secondary repolarization abnormality Anterior Q waves, possibly due to LVH ST depression, probably rate related Minimal ST elevation, lateral leads Artifact in lead(s) I III aVR aVL V4 V5 V6 and baseline wander in lead(s) V3 When compared with ECG of 12/15/2017, HEART RATE has increased T wave inversion is now present Inferior leads ST depression in Anterolateral leads is more prominent Confirmed by Delora Fuel ([id number]) on 12/23/2017 4:42:24 AM   Radiology No results found.  Procedures Procedures (including critical care time)  Medications Ordered in ED Medications  ceFEPIme (MAXIPIME) 2 g in sodium chloride 0.9 % 100 mL IVPB (2 g Intravenous New Bag/Given 12/23/17 0608)  metroNIDAZOLE (FLAGYL) IVPB 500 mg (has no administration in time range)  vancomycin (VANCOCIN) IVPB 1000 mg/200 mL premix (1,000 mg Intravenous New Bag/Given 12/23/17 0603)  norepinephrine (LEVOPHED) '4mg'$  in D5W 284m premix infusion (2 mcg/min Intravenous New  Bag/Given 12/23/17 0604)  dextrose 5 %-0.45 % sodium chloride infusion (has no administration in time range)  insulin regular bolus via infusion 0-10 Units (has no administration in time range)  insulin regular, human (MYXREDLIN) 100 units/ 100 mL infusion (has no administration in time range)  dextrose 50 % solution 25 mL (has  no administration in time range)  0.9 %  sodium chloride infusion (has no administration in time range)  sodium chloride 0.9 % bolus 1,000 mL (1,000 mLs Intravenous New Bag/Given 12/23/17 0609)  sodium chloride 0.9 % bolus 1,000 mL (0 mLs Intravenous Stopped 12/23/17 0608)     Initial Impression / Assessment and Plan / ED Course  I have reviewed the triage vital signs and the nursing notes.  Pertinent labs & imaging results that were available during my care of the patient were reviewed by me and considered in my medical decision making (see chart for details).  Clinical Course as of Dec 24 611  Sun Dec 23, 2017  2947 Decreased temperature noted at 89.23F rectal. Will provide heat warmer.   TempMarland Kitchen)(S): 89.4 F (31.9 C) [AH]  0449 EKG reveals T wave inversion in inferior leads and ST depression in anterolateral leads.  EKG 12-Lead [AH]  0500 Elevated lactic acid at 8.84. Will provide IVF.   Lactic Acid, Venous(!!): 8.84 [AH]  0520 Leukocytosis noted at 11.1. Suspect viral infection due to elevated lymphocytes.   WBC(!): 11.1 [AH]  0552 Hyperglycemia noted at 601. Will provide insulin.   Glucose(!!): 601 [AH]  0601 BP(!): 80/60 [AH]    Clinical Course User Index [AH] Arville Lime, PA-C   Patient presents with complaint of a fall.   Assessment/Plan: Patient presents with Ordered CMP and CBC to evaluate for WBCs for infection and electrolyte abnormalities. WBCs elevated at 11.1. Ordered lactic acid due to hypothermia. Provided bear hugger for hypothermia. Lactic acid was elevated at 8.84 and provided IVF. Ordered CT head due to recent fall and altered mental  status. Patient will require admission due to severity of hypothermia, hypotension, DKA, elevated lactic acid. Patient was started on insulin drip. Patient was also started on cefepime, metronidazole, and vancomycin due to sepsis. Will consult ICU due to critical status of patient. Critical care has agreed to admit patient.   Findings and plan of care discussed with supervising physician Dr. Roxanne Mins.  CRITICAL CARE Performed by: Brooks  Total critical care time: 45 minutes  Critical care time was exclusive of separately billable procedures and treating other patients.  Critical care was necessary to treat or prevent imminent or life-threatening deterioration.  Critical care was time spent personally by me on the following activities: development of treatment plan with patient and/or surrogate as well as nursing, discussions with consultants, evaluation of patient's response to treatment, examination of patient, obtaining history from patient or surrogate, ordering and performing treatments and interventions, ordering and review of laboratory studies, ordering and review of radiographic studies, pulse oximetry and re-evaluation of patient's condition.  Final Clinical Impressions(s) / ED Diagnoses   Final diagnoses:  Hypothermia, initial encounter  Diabetic ketoacidosis without coma associated with type 1 diabetes mellitus ([place])  Hypotension, unspecified hypotension type    ED Discharge Orders    None       Arville Lime, PA-C 12/23/17 Harper, [place], PA-C 65/46/50 3546    Delora Fuel, MD 56/81/27 [phone]

## 2017-12-24 LAB — BASIC METABOLIC PANEL
Anion gap: 8 (ref 5–15)
BUN: 23 mg/dL — ABNORMAL HIGH (ref 6–20)
CO2: 23 mmol/L (ref 22–32)
Calcium: 8.6 mg/dL — ABNORMAL LOW (ref 8.9–10.3)
Chloride: 110 mmol/L (ref 98–111)
Creatinine, Ser: 0.98 mg/dL (ref 0.61–1.24)
GFR calc Af Amer: >60 mL/min (ref >60)
GFR calc non Af Amer: >60 mL/min (ref >60)
Glucose, Bld: 185 mg/dL — ABNORMAL HIGH (ref 70–99)
Potassium: 3.9 mmol/L (ref 3.5–5.1)
SODIUM: 141 mmol/L (ref 135–145)

## 2017-12-24 LAB — URINE CULTURE

## 2017-12-24 LAB — GLUCOSE, CAPILLARY
Glucose-Capillary: 557 mg/dL (ref 70–99)
Glucose-Capillary: 104 mg/dL — ABNORMAL HIGH (ref 70–99)
Glucose-Capillary: 274 mg/dL — ABNORMAL HIGH (ref 70–99)
Glucose-Capillary: 331 mg/dL — ABNORMAL HIGH (ref 70–99)

## 2017-12-24 MED ORDER — ATORVASTATIN CALCIUM 40 MG PO TABS
40.0000 mg | ORAL_TABLET | Freq: Every day | ORAL | Status: DC
  Administered 2017-12-24 – 2017-12-25 (×2): 40 mg via ORAL
  Filled 2017-12-24 (×2): qty 1

## 2017-12-24 MED ORDER — LISINOPRIL 20 MG PO TABS
20.0000 mg | ORAL_TABLET | Freq: Every day | ORAL | Status: DC
  Administered 2017-12-24 – 2017-12-26 (×3): 20 mg via ORAL
  Filled 2017-12-24 (×3): qty 1

## 2017-12-24 NOTE — Progress Notes (Signed)
Inpatient Diabetes Program Recommendations  AACE/ADA: New Consensus Statement on Inpatient Glycemic Control (2015)  Target Ranges:  Prepandial:   less than 140 mg/dL      Peak postprandial:   less than 180 mg/dL (1-2 hours)      Critically ill patients:  140 - 180 mg/dL   Lab Results  Component Value Date   GLUCAP 104 (H) 12/24/2017   HGBA1C 11.1 (H) 08/04/2017    Review of Glycemic Control Results for Mike [name], Mike [name] (MRN [id number]) as of 12/24/2017 13:47  Ref. Range 12/23/2017 15:08 12/23/2017 16:11 12/23/2017 17:15 12/23/2017 19:27 12/24/2017 07:32  Glucose-Capillary Latest Ref Range: 70 - 99 mg/dL 782151 (H) 956124 (H) 95 213101 (H) 104 (H)   Diabetes history: DM 2- 23 ED visits in the past 6 months Outpatient Diabetes medications:  Per patient he takes 70/30 insulin (unsure of doses) Current orders for Inpatient glycemic control:  Levemir 15 units q 24 hours, Novolog moderate tid with meals  Inpatient Diabetes Program Recommendations:    Familiar with patient from past admissions/ED visits.  Spoke with patient today.  He states that he takes 70/30 when not in the hospital.  States that someone "gives it" [facility] at [facility]?  [facility] to make sure that they are able to do this for patient after d/c.  Patient will likely need more affordable regimen, such as Novolin (Reli-on) 70/30 10 units bid at d/c.  He states "I'm still hungry".  He does not seem to have insight into current status or d/c plan.  Will discuss with RN.   Thanks,  Beryl MeagerJenny Takeia Ciaravino, RN, BC-ADM Inpatient Diabetes Coordinator Pager [phone] ([phone])

## 2017-12-24 NOTE — Clinical Social Work Note (Signed)
Clinical Social Work Assessment  Patient Details  Name: Mike [name] MRN: [id number] Date of Birth: September 04, 1960  Date of referral:  12/24/17               Reason for consult:  [facility], Housing Concerns/Homelessness                Permission sought to share information with:  Family Supports Permission granted to share information::  Yes, Verbal Permission Granted  Name::     Mike [name]  Agency::  family  Relationship::  spouse  Contact Information:  Mike KavaSuzanne Fossum [phone]  Housing/Transportation Living arrangements for the past 2 months:  Homeless Source of Information:  Patient Patient Interpreter Needed:  None Criminal Activity/Legal Involvement Pertinent to Current Situation/Hospitalization:  No - Comment as needed Significant Relationships:  Merchandiser, retailCommunity Support, Spouse Lives with:  Spouse, Self Do you feel safe going back to the place where you live?  Yes Need for family participation in patient care:  Yes (Comment)  Care giving concerns:  CSW consulted as pt is homeless at this time.    Social Worker assessment / plan:  CSW spoke with pt at bedside. CSW was informed that [place] in [place]. Pt expressed that [place] [facility] [place] [place]. Pt expressed that pt's wife is who encouraged them to move and now they are homeless. CSW spoke with pt about local organizations that may be appropriate for pt to get further services from such as DSS and IRC. Pt expressed being familiar with [facility] and asked that CSW provide further information on local shelters to pt for wife and pt.   CSW attempted to [facility] however pt does not have a phone nor email address for agencies to follow up with pt. CSW also has reached out to [facility] for further resources for pt at this time.   Employment status:  Unemployed Health and safety inspectornsurance information:  Self Pay (Medicaid Pending) PT  Recommendations:  Not assessed at this time Information / Referral to community resources:  Shelter  Patient/Family's Response to care: Pt appeared to be understanding of plan of care at this time. Pt requested Sprite.   Patient/Family's Understanding of and Emotional Response to Diagnosis, Current Treatment, and Prognosis:  At this time pt expressed no further concerns to CSW. CSW offered emotional support to pt as pt drank Sprite and watched TV.   Emotional Assessment Appearance:  Appears stated age Attitude/Demeanor/Rapport:  Engaged Affect (typically observed):  Pleasant, Appropriate Orientation:  Oriented to Self, Oriented to Place, Oriented to  Time, Oriented to Situation Alcohol / Substance use:  Not Applicable Psych involvement (Current and /or in the community):  No (Comment)(not at this time. )  Discharge Needs  Concerns to be addressed:  Lack of Support, Homelessness Readmission within the last 30 days:  Yes Current discharge risk:  Homeless, Inadequate Financial Supports, Lack of support system Barriers to Discharge:  Continued Medical Work up   Sempra EnergyKierra S Dariya [name], LCSWA 12/24/2017, 8:02 AM

## 2017-12-24 NOTE — Progress Notes (Signed)
Lab at bedside

## 2017-12-24 NOTE — Progress Notes (Signed)
Attempted report, nurse to call back. Huntley EstelleLewis, Ellyse Rotolo E, RN 12/24/2017 5:20 PM

## 2017-12-24 NOTE — Progress Notes (Signed)
Nurse still tied up with patient care, charge nurse to call back in 5 mins. To take report on patient. Tamsen MeekLewis, Artrice Kraker E, RN 12/24/2017 5:39 PM

## 2017-12-24 NOTE — Care Management (Signed)
CM received a consult for medication assistance - CM [facility]  [facility] now closed for the day - CM will follow up with center in the am

## 2017-12-24 NOTE — Progress Notes (Addendum)
8:45am-CSW spoke with Daishya ([email or web address])  from the [facility] and was informed that they are familiar with pt and are currently working with pt for services.   CSW spoke with [facility]. CSW was asked to reach out to [facility] staff to ensure that they are aware of pt. CSW has reached [facility] to [facility]. CSW awaits response at this time.   CSW has also placed resources on pt's chart. CSW will continue to follow for further needs.     Claude MangesKierra S. Farah Benish, MSW, [name]-A Emergency Department Clinical Social Worker [phone]

## 2017-12-24 NOTE — Progress Notes (Signed)
PULMONARY / CRITICAL CARE MEDICINE   NAME:  Mike [name], MRN:  [id number], DOB:  1960/10/29, LOS: 1 ADMISSION DATE:  12/23/2017, CONSULTATION DATE:  12/23/2017 REFERRING MD:  ED physician , CHIEF COMPLAINT: hyperglycemia and weakness   BRIEF HISTORY:    57 year old male homeless with noncompliance to his diabetes medicine coming in with altered mental status and DKA with hypotension.   HISTORY OF PRESENT ILLNESS   Mr. Wahlberg is a 57 year old male who is homeless with known type 2 diabetes mellitus hypertension hyperlipidemia who was in the emergency last night for hypoglycemia and was discharged since patient was not in DKA after managing his hyperglycemia he was shortly found by bystanders crawling outside of the hospital he states that he tripped and fell but not able to get much detail from him since his altered mental status patient was found to be hypotensive hypothermic with temperature of 89 and systolic blood pressure of 80 with severe acidosis lactic acid of 8.  Patient received almost 3 L of IV fluids and is on Levophed 2 mics and with a bear hugger.  Patient denies any fever no vomiting no diarrhea and no cough no sputum.  Patient denies any abdominal pain no chest pain no shortness of breath.  He does not take anything for his diabetes and has multiple visits to the emergency room with hyperglycemia.  SIGNIFICANT PAST MEDICAL HISTORY   -Type 2 diabetes mellitus -Noncompliance  SIGNIFICANT EVENTS:   STUDIES:   CT head>> chronic atrophic changes, no acute finding  CULTURES:    ANTIBIOTICS:  Received cefepime and vancomycin in the ED  LINES/TUBES:    CONSULTANTS:   SUBJECTIVE:   CONSTITUTIONAL: BP (!) 174/85   Pulse 87   Temp 98.1 F (36.7 C) (Oral)   Resp 17   Ht 6\' 2"  (1.88 m)   Wt 77.1 kg   SpO2 96%   BMI 21.82 kg/m   I/O last 3 completed shifts: In: 6218.2 [P.O.:960; I.V.:550.9; IV Piggyback:4707.4] Out: 800 [Urine:800]        PHYSICAL  EXAM: General: A bit sleepy but otherwise stable, well-appearing Neuro: Wakes easily to voice, answers questions, follows commands HEENT: Oropharynx moist, pupils equal Cardiovascular: Regular, no murmur Lungs: Clear bilaterally, no wheezes, no crackles abdomen: Soft, benign, positive bowel sounds Musculoskeletal: No edema Skin: No rash  RESOLVED PROBLEM LIST  -Acute metabolic encephalopathy, resolved  -Acute kidney injury, resolved -Hypothermia -Hypotension secondary to hypovolemia -Lactic acidosis   ASSESSMENT AND PLAN   Assessment: -Diabetic ketoacidosis, Improved On Levemir 15, sliding scale regular insulin. Need to adjust based on diabetic coordinator recommendations, changes as his p.o. diet improves.  Significant concern is his medical compliance  -Baseline HTN On amlodipine, lisinopril, metoprolol as an outpatient.  Again compliance is likely poor.  I will start him on half dose lisinopril, add and uptitrate uptitrate his other medications over the next few days.  -Hyperlipidemia Add back Lipitor   He can transition out of the ICU today  SUMMARY OF TODAY'S PLAN:    Best Practice / Goals of Care / Disposition.   DVT PROPHYLAXIS: SCD SUP: None NUTRITION: N.p.o. MOBILITY: Bedrest GOALS OF CARE: Full code FAMILY DISCUSSIONS: No family around DISPOSITION to floor 12/9  LABS  Glucose Recent Labs  Lab 12/23/17 1508 12/23/17 1611 12/23/17 1715 12/23/17 1927 12/24/17 0732 12/24/17 1149  GLUCAP 151* 124* 95 101* 104* 274*    BMET Recent Labs  Lab 12/23/17 1502 12/23/17 1926 12/24/17 0541  NA 142 144 141  K  3.7 3.7 3.9  CL 118* 113* 110  CO2 23 24 23   BUN 25* 21* 23*  CREATININE 0.94 0.91 0.98  GLUCOSE 175* 117* 185*    Liver Enzymes Recent Labs  Lab 12/23/17 0442  AST 23  ALT 22  ALKPHOS 108  BILITOT 0.9  ALBUMIN 3.3*    Electrolytes Recent Labs  Lab 12/23/17 1502 12/23/17 1926 12/24/17 0541  CALCIUM 8.2* 8.6* 8.6*     CBC Recent Labs  Lab 12/22/17 1905 12/23/17 0442 12/23/17 0536  WBC 7.2 11.1*  --   HGB 11.4* 11.2* 13.3  HCT 37.6* 38.6* 39.0  PLT 355 435*  --     ABG Recent Labs  Lab 12/23/17 0714  PHART 7.288*  PCO2ART 27.9*  PO2ART 113.0*    Coag's No results for input(s): APTT, INR in the last 168 hours.  Sepsis Markers Recent Labs  Lab 12/23/17 0447 12/23/17 0642  LATICACIDVEN 8.84* 3.17*      Levy Pupa, MD, PhD 12/24/2017, 3:12 PM [facility] [phone] or if no answer [phone]

## 2017-12-24 NOTE — Progress Notes (Signed)
Called lab to check on status of BMET to be collected for AM labs.  Lav stated their worklist shows due at 0630.  Advised order states due at 0500.  Requested lab be drawn now.  Lab advised they were in report and she would pass off to next phlebotomist.

## 2017-12-25 DIAGNOSIS — N179 Acute kidney failure, unspecified: Secondary | ICD-10-CM

## 2017-12-25 DIAGNOSIS — E0810 Diabetes mellitus due to underlying condition with ketoacidosis without coma: Secondary | ICD-10-CM

## 2017-12-25 DIAGNOSIS — E109 Type 1 diabetes mellitus without complications: Secondary | ICD-10-CM

## 2017-12-25 LAB — BASIC METABOLIC PANEL
Anion gap: 9 (ref 5–15)
BUN: 17 mg/dL (ref 6–20)
CALCIUM: 8.4 mg/dL — AB (ref 8.9–10.3)
CO2: 22 mmol/L (ref 22–32)
CREATININE: 0.84 mg/dL (ref 0.61–1.24)
Chloride: 107 mmol/L (ref 98–111)
GFR calc Af Amer: >60 mL/min (ref >60)
GFR calc non Af Amer: >60 mL/min (ref >60)
Glucose, Bld: 172 mg/dL — ABNORMAL HIGH (ref 70–99)
Potassium: 4 mmol/L (ref 3.5–5.1)
Sodium: 138 mmol/L (ref 135–145)

## 2017-12-25 LAB — GLUCOSE, CAPILLARY
Glucose-Capillary: 419 mg/dL — ABNORMAL HIGH (ref 70–99)
Glucose-Capillary: 431 mg/dL — ABNORMAL HIGH (ref 70–99)
Glucose-Capillary: 182 mg/dL — ABNORMAL HIGH (ref 70–99)
Glucose-Capillary: 351 mg/dL — ABNORMAL HIGH (ref 70–99)

## 2017-12-25 LAB — HEMOGLOBIN A1C
Hgb A1c MFr Bld: 10.7 % — ABNORMAL HIGH (ref 4.8–5.6)
Mean Plasma Glucose: 260.39 mg/dL

## 2017-12-25 LAB — MAGNESIUM: Magnesium: 1.8 mg/dL (ref 1.7–2.4)

## 2017-12-25 MED ORDER — INSULIN ASPART 100 UNIT/ML ~~LOC~~ SOLN
5.0000 [IU] | Freq: Three times a day (TID) | SUBCUTANEOUS | Status: DC
  Administered 2017-12-25 (×2): 5 [IU] via SUBCUTANEOUS

## 2017-12-25 MED ORDER — ASPIRIN EC 81 MG PO TBEC
81.0000 mg | DELAYED_RELEASE_TABLET | Freq: Every day | ORAL | Status: DC
  Administered 2017-12-25 – 2017-12-26 (×2): 81 mg via ORAL
  Filled 2017-12-25 (×2): qty 1

## 2017-12-25 MED ORDER — INSULIN DETEMIR 100 UNIT/ML ~~LOC~~ SOLN
20.0000 [IU] | SUBCUTANEOUS | Status: DC
  Administered 2017-12-25: 20 [IU] via SUBCUTANEOUS
  Filled 2017-12-25 (×2): qty 0.2

## 2017-12-25 MED ORDER — INSULIN ASPART 100 UNIT/ML ~~LOC~~ SOLN: 4.0000 [IU] | Freq: Three times a day (TID) | SUBCUTANEOUS | Status: DC

## 2017-12-25 MED ORDER — METOPROLOL TARTRATE 12.5 MG HALF TABLET
12.5000 mg | ORAL_TABLET | Freq: Two times a day (BID) | ORAL | Status: DC
  Administered 2017-12-25 – 2017-12-26 (×3): 12.5 mg via ORAL
  Filled 2017-12-25 (×2): qty 1

## 2017-12-25 NOTE — Progress Notes (Signed)
[facility]                                                                              Patient Demographics  Mike [name], is a 57 y.o. male, DOB - 03/31/1960, ZOX:[id number]  Admit date - 12/23/2017   Admitting Physician Reyes Ivan, MD  Outpatient Primary MD for the patient is [name], [name], NP  Outpatient specialists:   LOS - 2  days   Medical records reviewed and are as summarized below:    Chief Complaint  Patient presents with  . Fall       Brief summary   Patient is a 57 year old male, homeless, known type 2 diabetes mellitus, hypertension, hyperlipidemia initially presented to ED on 12/7 with hyperglycemia, was discharged since patient was not in DKA.  Shortly after he was found by bystanders crawling outside the hospital stating that he tripped and had altered mental status.  He was found to be hypotensive, hypothermic, temperature of 89 F, systolic blood pressure in 80s, severe acidosis with lactic acid of 8.  Patient received 3 L of IV fluids, was placed on Levophed, bear hugger.  Patient was admitted to ICU for further work-up.   Assessment & Plan    Principal Problem:   DKA (diabetic ketoacidoses) (HCC) with brittle diabetes, lactic acidosis -Patient was found to be in severe acidosis, hypotensive and hypothermic, lactic acid of 8. -Patient was admitted to ICU with aggressive IV fluid hydration and vasopressor, IV insulin drip -Patient was transitioned off insulin drip once gap closed, started on Levemir 15 units with sliding scale insulin -CBGs elevated in 400s today, increase Levemir to 20 units daily, added meal coverage, sliding scale insulin -Significant concern for medical compliance given homelessness and patient has not been able to get consistent medical care. -Patient states that he will not be able to afford reli-on Novolin, states that he gets his insulin from [facility] but does not have any insulin at home at this  time. -Hemoglobin A1c 10.7   Active Problems:  Acute metabolic encephalopathy with hypothermia -Currently resolved, patient alert and oriented, appears close to his baseline mental status, likely due to #1  Hypotension secondary to hypovolemia -BP now improving, tolerating diet, placed back on lisinopril 20 mg daily -Restart metoprolol 12.5 mg twice daily     History of stroke -No residual deficits, continue glycemic control, BP control, continue statin, aspirin 81 mg daily    AKI (acute kidney injury) (HCC) -Presented with creatinine of 1.6 on admission.  On 12/7 patient's creatinine was 1.1 -Resolved with IV fluids, creatinine now 0.8   Code Status: Full CODE STATUS DVT Prophylaxis: SCDs Family Communication: Discussed in detail with the patient, all imaging results, lab results explained to the patient   Disposition Plan: Homeless, states however he can go to his girlfriend's house, needs insulin in hand before he can be discharged.  [facility]eds appointment at [facility].  Time Spent in minutes   35 minutes Procedures:  *None  Consultants:   Patient was admitted by PCCM  Antimicrobials:      Medications  Scheduled Meds: . atorvastatin  40 mg Oral q1800  .  insulin aspart  0-15 Units Subcutaneous TID WC  . insulin aspart  5 Units Subcutaneous TID WC  . insulin detemir  20 Units Subcutaneous Q24H  . lisinopril  20 mg Oral Daily   Continuous Infusions: PRN Meds:.dextrose   Antibiotics   Anti-infectives (From admission, onward)   Start     Dose/Rate Route Frequency Ordered Stop   12/23/17 0600  ceFEPIme (MAXIPIME) 2 g in sodium chloride 0.9 % 100 mL IVPB     2 g 200 mL/hr over 30 Minutes Intravenous  Once 12/23/17 0548 12/23/17 0715   12/23/17 0600  metroNIDAZOLE (FLAGYL) IVPB 500 mg  Status:  Discontinued     500 mg 100 mL/hr over 60 Minutes Intravenous Every 8 hours 12/23/17 0548 12/23/17 0742   12/23/17 0600  vancomycin (VANCOCIN) IVPB 1000 mg/200 mL premix      1,000 mg 200 mL/hr over 60 Minutes Intravenous  Once 12/23/17 0548 12/23/17 0716        Subjective:   Mike [name] was seen and examined today.  CBGs elevated in 400s today, eating breakfast, denies any specific complaints. Patient denies dizziness, chest pain, shortness of breath, abdominal pain, N/V/D/C, new weakness, numbess, tingling. No acute events overnight.    Objective:   Vitals:   12/24/17 1821 12/24/17 2054 12/25/17 0212 12/25/17 0551  BP: (!) 151/87 (!) 150/84 (!) 152/87 (!) 154/84  Pulse:  (!) 117 78 74  Resp: 12 18 18 16   Temp: 98.7 F (37.1 C) 98.6 F (37 C) 98.5 F (36.9 C) 98.6 F (37 C)  TempSrc: Oral Oral Oral Oral  SpO2: 100% 99% 100% 100%  Weight:      Height:        Intake/Output Summary (Last 24 hours) at 12/25/2017 1028 Last data filed at 12/25/2017 0757 Gross per 24 hour  Intake 240 ml  Output 800 ml  Net -560 ml     Wt Readings from Last 3 Encounters:  12/23/17 77.1 kg  12/22/17 77.1 kg  12/12/17 77.1 kg     Exam  General: Alert and oriented x 3, NAD  Eyes:   HEENT:  Atraumatic, normocephalic  Cardiovascular: S1 S2 auscultated, Regular rate and rhythm.  Respiratory: Clear to auscultation bilaterally, no wheezing, rales or rhonchi  Gastrointestinal: Soft, nontender, nondistended, + bowel sounds  Ext: no pedal edema bilaterally  Neuro: No new deficits  Musculoskeletal: No digital cyanosis, clubbing  Skin: No rashes  Psych: Normal affect and demeanor, alert and oriented x3    Data Reviewed:  I have personally reviewed following labs and imaging studies  Micro Results Recent Results (from the past 240 hour(s))  Urine culture     Status: Abnormal   Collection Time: 12/23/17  5:17 AM  Result Value Ref Range Status   Specimen Description URINE, RANDOM  Final   Special Requests   Final    NONE Performed at [facility], [address], [place], [place] [address]    Culture MULTIPLE SPECIES PRESENT, SUGGEST  RECOLLECTION (A)  Final   Report Status 12/24/2017 FINAL  Final  Blood culture (routine x 2)     Status: None (Preliminary result)   Collection Time: 12/23/17  5:30 AM  Result Value Ref Range Status   Specimen Description BLOOD RIGHT HAND  Final   Special Requests   Final    BOTTLES DRAWN AEROBIC ONLY Blood Culture results may not be optimal due to an inadequate volume of blood received in culture bottles   Culture  Final    NO GROWTH 1 DAY Performed at [facility], [address], [place], [place] [address]    Report Status PENDING  Incomplete  Blood culture (routine x 2)     Status: None (Preliminary result)   Collection Time: 12/23/17  5:47 AM  Result Value Ref Range Status   Specimen Description BLOOD RIGHT HAND  Final   Special Requests   Final    BOTTLES DRAWN AEROBIC AND ANAEROBIC Blood Culture results may not be optimal due to an inadequate volume of blood received in culture bottles   Culture   Final    NO GROWTH 1 DAY Performed at [facility], [address] [place], [place] [address]    Report Status PENDING  Incomplete  MRSA PCR Screening     Status: None   Collection Time: 12/23/17  8:13 AM  Result Value Ref Range Status   MRSA by PCR NEGATIVE NEGATIVE Final    Comment:        The GeneXpert MRSA Assay (FDA approved for NASAL specimens only), is one component of a comprehensive MRSA colonization surveillance program. It is not intended to diagnose MRSA infection nor to guide or monitor treatment for MRSA infections. Performed at [facility], [address] [place], [place] [address]     Radiology Reports Dg Chest [place] 1 View  Result Date: 12/23/2017 CLINICAL DATA:  57 year old male with hypotension. EXAM: PORTABLE CHEST 1 VIEW COMPARISON:  Chest radiograph dated 03/27/2017 FINDINGS: The lungs are clear. There is no pleural effusion or pneumothorax. Bilateral nipple shadows noted. Borderline cardiomegaly. No acute osseous pathology.  Borderline cardiomegaly. IMPRESSION: No active disease. Electronically Signed   By: Elgie Collard M.D.   On: 12/23/2017 06:57    Lab Data:  CBC: Recent Labs  Lab 12/22/17 1905 12/23/17 0442 12/23/17 0536  WBC 7.2 11.1*  --   NEUTROABS 4.9 6.2  --   HGB 11.4* 11.2* 13.3  HCT 37.6* 38.6* 39.0  MCV 81.0 84.5  --   PLT 355 435*  --    Basic Metabolic Panel: Recent Labs  Lab 12/23/17 1017 12/23/17 1502 12/23/17 1926 12/24/17 0541 12/25/17 0220  NA 144 142 144 141 138  K 4.2 3.7 3.7 3.9 4.0  CL 113* 118* 113* 110 107  CO2 18* 23 24 23 22   GLUCOSE 383* 175* 117* 185* 172*  BUN 32* 25* 21* 23* 17  CREATININE 1.24 0.94 0.91 0.98 0.84  CALCIUM 8.5* 8.2* 8.6* 8.6* 8.4*  MG  --   --   --   --  1.8   GFR: Estimated Creatinine Clearance: 107.1 mL/min (by C-G formula based on SCr of 0.84 mg/dL). Liver Function Tests: Recent Labs  Lab 12/23/17 0442  AST 23  ALT 22  ALKPHOS 108  BILITOT 0.9  PROT 7.4  ALBUMIN 3.3*   No results for input(s): LIPASE, AMYLASE in the last 168 hours. No results for input(s): AMMONIA in the last 168 hours. Coagulation Profile: No results for input(s): INR, PROTIME in the last 168 hours. Cardiac Enzymes: No results for input(s): CKTOTAL, CKMB, CKMBINDEX, TROPONINI in the last 168 hours. BNP (last 3 results) No results for input(s): PROBNP in the last 8760 hours. HbA1C: Recent Labs    12/25/17 0701  HGBA1C 10.7*   CBG: Recent Labs  Lab 12/23/17 1927 12/24/17 0732 12/24/17 1149 12/24/17 2058 12/25/17 0839  GLUCAP 101* 104* 274* 331* 419*   Lipid Profile: No results for input(s): CHOL,  HDL, LDLCALC, TRIG, CHOLHDL, LDLDIRECT in the last 72 hours. Thyroid Function Tests: No results for input(s): TSH, T4TOTAL, FREET4, T3FREE, THYROIDAB in the last 72 hours. Anemia Panel: No results for input(s): VITAMINB12, FOLATE, FERRITIN, TIBC, IRON, RETICCTPCT in the last 72 hours. Urine analysis:    Component Value Date/Time   COLORURINE  YELLOW 12/22/2017 2018   APPEARANCEUR CLEAR 12/22/2017 2018   LABSPEC 1.028 12/22/2017 2018   PHURINE 5.0 12/22/2017 2018   GLUCOSEU >=500 (A) 12/22/2017 2018   HGBUR SMALL (A) 12/22/2017 2018   BILIRUBINUR NEGATIVE 12/22/2017 2018   KETONESUR 20 (A) 12/22/2017 2018   PROTEINUR 30 (A) 12/22/2017 2018   UROBILINOGEN 1.0 02/07/2011 1605   NITRITE NEGATIVE 12/22/2017 2018   LEUKOCYTESUR NEGATIVE 12/22/2017 2018     Ripudeep Rai M.D. [facility] 12/25/2017, 10:28 AM  Pager: [phone] Between 7am to 7pm - call Pager - [phone]  After 7pm go to [email or web address] - password [email or web address]  Call night coverage person covering after 7pm

## 2017-12-25 NOTE — Care Management Note (Signed)
Case Management Note  Patient Details  Name: Tobin ChadDesbourns Mangini MRN: [id number] Date of Birth: 07/22/1960  Subjective/Objective:                    Action/Plan:  [facility] and can have medical follow up and receive medications.  [facility]   Spoke with [name] NP with [facility] , she knows Mr Vita ErmGenius very well. Mr Vita ErmGenius can come to clinic as a walk in and be seen. However, number to clinic to schedule an appointment is [phone] ext [phone]. NCM called number and left message for call back. Again Chales AbrahamsMary Ann [name] stated patient can show up as walk in and be seen. Expected Discharge Date:                  Expected Discharge Plan:     In-House Referral:  Clinical Social Work, Museum/gallery exhibitions officerinancial Counselor  Discharge planning Services  CM Consult, Medication Assistance  Post Acute Care Choice:    Choice offered to:     DME Arranged:  N/A DME Agency:  NA  HH Arranged:  NA HH Agency:  NA  Status of Service:  In process, will continue to follow  If discussed at Long Length of Stay Meetings, dates discussed:    Additional Comments:  Kingsley PlanWile, Inez Stantz Marie, RN 12/25/2017, 12:59 PM

## 2017-12-26 LAB — BASIC METABOLIC PANEL
Anion gap: 6 (ref 5–15)
BUN: 15 mg/dL (ref 6–20)
CO2: 26 mmol/L (ref 22–32)
Calcium: 8.5 mg/dL — ABNORMAL LOW (ref 8.9–10.3)
Chloride: 104 mmol/L (ref 98–111)
Creatinine, Ser: 0.82 mg/dL (ref 0.61–1.24)
GFR calc Af Amer: >60 mL/min (ref >60)
GFR calc non Af Amer: >60 mL/min (ref >60)
Glucose, Bld: 227 mg/dL — ABNORMAL HIGH (ref 70–99)
POTASSIUM: 4 mmol/L (ref 3.5–5.1)
Sodium: 136 mmol/L (ref 135–145)

## 2017-12-26 LAB — GLUCOSE, CAPILLARY: Glucose-Capillary: 92 mg/dL (ref 70–99)

## 2017-12-26 MED ORDER — METOPROLOL TARTRATE 25 MG PO TABS: 12.5000 mg | ORAL_TABLET | Freq: Two times a day (BID) | ORAL | 0 refills | Status: DC

## 2017-12-26 MED ORDER — ATORVASTATIN CALCIUM 40 MG PO TABS: 40.0000 mg | ORAL_TABLET | Freq: Every day | ORAL | 1 refills | Status: DC

## 2017-12-26 MED ORDER — LISINOPRIL 20 MG PO TABS: 20.0000 mg | ORAL_TABLET | Freq: Every day | ORAL | 1 refills | Status: DC

## 2017-12-26 MED ORDER — INSULIN PEN NEEDLE 31G X 6 MM MISC: 1.0000 | Freq: Two times a day (BID) | 3 refills | Status: DC

## 2017-12-26 MED ORDER — INSULIN ASPART 100 UNIT/ML ~~LOC~~ SOLN: 0.0000 [IU] | Freq: Three times a day (TID) | SUBCUTANEOUS | Status: DC

## 2017-12-26 MED ORDER — INSULIN NPH ISOPHANE & REGULAR (70-30) 100 UNIT/ML ~~LOC~~ SUSP: 10.0000 [IU] | Freq: Two times a day (BID) | SUBCUTANEOUS | 4 refills | Status: DC

## 2017-12-26 MED ORDER — BLOOD GLUCOSE MONITOR KIT: PACK | 0 refills | Status: DC

## 2017-12-26 MED FILL — ULTICARE INS 0.3 ML 30GX1/2: 30G X 1/2" | 18 days supply | Qty: 60 | Fill #0

## 2017-12-26 MED FILL — NOVOLIN 70/30 100 UNITS/ML: (70-30) 100 | 28 days supply | Qty: 10 | Fill #0

## 2017-12-26 MED FILL — ATORVASTATIN CALCIUM 40 MG: 40 | 30 days supply | Qty: 30 | Fill #0

## 2017-12-26 MED FILL — LISINOPRIL 20 MG TABLET: 20 | 30 days supply | Qty: 30 | Fill #0

## 2017-12-26 MED FILL — METOPROLOL TARTRATE 25 MG T: 25 | 30 days supply | Qty: 60 | Fill #0

## 2017-12-26 NOTE — Progress Notes (Signed)
Pt. Discharged at 1130. Pt. Does not have any belongings; Tech went to ED to get pt. Paper scrubs; Pt. Was eating lunch when tech returned; Allowed pt. To continue to each lunch, but pt. Now refuses to put on paper scrubs. Pt. Comes out into the hall with blanket around shoulders and nothing underneath and had urinated and had bowel movement on the floor. Security called as pt. Is now being noncompliant. Security has just arrived.

## 2017-12-26 NOTE — Discharge Summary (Signed)
Physician Discharge Summary   Patient ID: Mike [name] MRN: [id number] DOB/AGE: 05-13-1960 57 y.o.  Admit date: 12/23/2017 Discharge date: 12/26/2017  Primary Care Physician:  [name], NP   Recommendations for Outpatient Follow-up:  1. Patient recommended to follow-up with Merideth Abbey, NP at [place]: Equipment/Devices:   Discharge Condition: stable  CODE STATUS: FULL  Diet recommendation: Carb modified diet   Discharge Diagnoses:    . DKA (diabetic ketoacidoses) ([place]) . 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 Hypotension secondary to hypovolemia . AKI (acute kidney injury) ([place]) Acute metabolic encephalopathy with hypothermia History of stroke  Consults: Patient was admitted by CCM    Allergies:   Allergies  Allergen Reactions  . Ibuprofen Nausea And Vomiting and Other (See Comments)    Makes the patient feel bloated also  . Tylenol [Acetaminophen] Nausea And Vomiting and Other (See Comments)    Makes the patient feel bloated also     DISCHARGE MEDICATIONS: Allergies as of 12/26/2017      Reactions   Ibuprofen Nausea And Vomiting, Other (See Comments)   Makes the patient feel bloated also   Tylenol [acetaminophen] Nausea And Vomiting, Other (See Comments)   Makes the patient feel bloated also      Medication List    STOP taking these medications   amLODipine 10 MG tablet Commonly known as:  NORVASC   ferrous sulfate 325 (65 FE) MG tablet   insulin aspart 100 UNIT/ML injection Commonly known as:  novoLOG   insulin glargine 100 UNIT/ML injection Commonly known as:  LANTUS     TAKE these medications   atorvastatin 40 MG tablet Commonly known as:  LIPITOR Take 1 tablet (40 mg total) by mouth daily.   blood glucose meter kit and supplies Kit Dispense based on patient and insurance preference. Use up to four times daily as directed. (FOR ICD-9 250.00, 250.01). What changed:  Another medication with the same name was removed. Continue  taking this medication, and follow the directions you see here.   insulin NPH-regular Human (70-30) 100 UNIT/ML injection Inject 10 Units into the skin 2 (two) times daily with a meal.   Insulin Pen Needle 31G X 6 MM Misc 1 Device by Does not apply route 2 (two) times daily.   lisinopril 20 MG tablet Commonly known as:  PRINIVIL,ZESTRIL Take 1 tablet (20 mg total) by mouth daily. What changed:    medication strength  how much to take   metoprolol tartrate 25 MG tablet Commonly known as:  LOPRESSOR Take 0.5 tablets (12.5 mg total) by mouth 2 (two) times daily.        Brief H and P: For complete details please refer to admission H and P, but in brief Patient is a 57 year old male, homeless, known type 2 diabetes mellitus, hypertension, hyperlipidemia initially presented to ED on 12/7 with hyperglycemia, was discharged since patient was not in DKA.  Shortly after he was found by bystanders crawling outside the hospital stating that he tripped and had altered mental status.  He was found to be hypotensive, hypothermic, temperature of 89 F, systolic blood pressure in 80s, severe acidosis with lactic acid of 8.  Patient received 3 L of IV fluids, was placed on Levophed, bear hugger.  Patient was admitted to ICU for further work-up.  Hospital Course:     DKA (diabetic ketoacidoses) ([place]) with brittle diabetes, lactic acidosis -Patient was found to be in severe acidosis, hypotensive and hypothermic, lactic acid of 8 of admission. -  Patient was admitted to ICU with aggressive IV fluid hydration and vasopressor, IV insulin drip -Patient was transitioned off insulin drip once gap closed, started on Levemir 15 units with sliding scale insulin -Significant concern for medical compliance given homelessness and patient has not been able to get consistent medical care. -Patient states that he will not be able to afford reli-on Novolin, states that he gets his insulin from [facility] but does not have any  insulin at home at this time. -Hemoglobin A1c 10.7 -Case management consult was obtained, Match letter was done, patient was given insulin from [facility] transition of care pharmacy.  Strongly recommended patient to follow-up with his PCP, Benita Gutter NP at [facility] for further insulin prescriptions.  Placed on Novolin 70/30 10 units twice a day as per recommendations from the diabetic coordinator   Acute metabolic encephalopathy with hypothermia -Currently resolved, patient alert and oriented, appears close to his baseline mental status, likely due to #1  Hypotension secondary to hypovolemia -BP now improving, tolerating diet, placed back on lisinopril 20 mg daily -Continue metoprolol 12.5 mg twice daily    History of stroke -No residual deficits, continue glycemic control, BP control, continue statin, aspirin 81 mg daily    AKI (acute kidney injury) ([place]) -Presented with creatinine of 1.6 on admission.  On 12/7 patient's creatinine was 1.1 -Resolved with IV fluids, creatinine now improved to 0.8. Tolerating diet   Day of Discharge S: No complaints, no chest pain, shortness of breath, fevers or chills, tolerating diet.  BP (!) 155/91   Pulse 77   Temp (!) 97.5 F (36.4 C) (Oral)   Resp 18   Ht '6\' 2"'$  (1.88 m)   Wt 77.1 kg   SpO2 100%   BMI 21.82 kg/m   Physical Exam: General: Alert and awake oriented x3 not in any acute distress. HEENT: anicteric sclera, pupils reactive to light and accommodation CVS: S1-S2 clear no murmur rubs or gallops Chest: clear to auscultation bilaterally, no wheezing rales or rhonchi Abdomen: soft nontender, nondistended, normal bowel sounds Extremities: no cyanosis, clubbing or edema noted bilaterally Neuro: Cranial nerves II-XII intact, no focal neurological deficits   The results of significant diagnostics from this hospitalization (including imaging, microbiology, ancillary and laboratory) are listed below for reference.       Procedures/Studies:  Dg Chest Port 1 View  Result Date: 12/23/2017 CLINICAL DATA:  57 year old male with hypotension. EXAM: PORTABLE CHEST 1 VIEW COMPARISON:  Chest radiograph dated 03/27/2017 FINDINGS: The lungs are clear. There is no pleural effusion or pneumothorax. Bilateral nipple shadows noted. Borderline cardiomegaly. No acute osseous pathology. Borderline cardiomegaly. IMPRESSION: No active disease. Electronically Signed   By: Anner Crete M.D.   On: 12/23/2017 06:57       LAB RESULTS: Basic Metabolic Panel: Recent Labs  Lab 12/25/17 0220 12/26/17 0144  NA 138 136  K 4.0 4.0  CL 107 104  CO2 22 26  GLUCOSE 172* 227*  BUN 17 15  CREATININE 0.84 0.82  CALCIUM 8.4* 8.5*  MG 1.8  --    Liver Function Tests: Recent Labs  Lab 12/23/17 0442  AST 23  ALT 22  ALKPHOS 108  BILITOT 0.9  PROT 7.4  ALBUMIN 3.3*   No results for input(s): LIPASE, AMYLASE in the last 168 hours. No results for input(s): AMMONIA in the last 168 hours. CBC: Recent Labs  Lab 12/22/17 1905 12/23/17 0442 12/23/17 0536  WBC 7.2 11.1*  --   NEUTROABS 4.9 6.2  --  HGB 11.4* 11.2* 13.3  HCT 37.6* 38.6* 39.0  MCV 81.0 84.5  --   PLT 355 435*  --    Cardiac Enzymes: No results for input(s): CKTOTAL, CKMB, CKMBINDEX, TROPONINI in the last 168 hours. BNP: Invalid input(s): POCBNP CBG: Recent Labs  Lab 12/25/17 2223 12/26/17 0855  GLUCAP 351* 92      Disposition and Follow-up: Discharge Instructions    Diet Carb Modified   Complete by:  As directed    Discharge instructions   Complete by:  As directed    It is VERY IMPORTANT that you follow up with a PCP on a regular basis.  Check your blood glucoses before each meal and at bedtime and maintain a log of your readings.  Bring this log with you when you follow up with your PCP so that he or she can adjust your insulin at your follow up visit.   Increase activity slowly   Complete by:  As directed        DISPOSITION:  Home  DISCHARGE FOLLOW-UP Follow-up Information    [facility] information: [place] [address] [phone]        [name], NP. Schedule an appointment as soon as possible for a visit.   Why:  IRC. please go to [facility] for follow-up appointment within next 4 days, you can go in as walk-in.    Contact information: [place] [address] [phone]            Time coordinating discharge:  35 minutes  Signed:   Estill Cotta M.D. [facility] 12/26/2017, 12:32 PM Pager: [phone]

## 2017-12-26 NOTE — Care Management Note (Signed)
Case Management Note  Patient Details  Name: Tobin ChadDesbourns Truax MRN: [id number] Date of Birth: 1960-05-08  Subjective/Objective:                    Action/Plan:  See previous notes. [facility]. However, MD [facility].   Texted Nita in pharmacy [facility]( MATCH) , no response as of yet. [facility] Reeves Forthamille Wood given [facility]. Same done. Expected Discharge Date:  12/26/17               Expected Discharge Plan:     In-House Referral:  Clinical Social Work, Museum/gallery exhibitions officerinancial Counselor  Discharge planning Services  CM Consult, Medication Assistance, MATCH Program  Post Acute Care Choice:  NA Choice offered to:  Patient  DME Arranged:  N/A DME Agency:  NA  HH Arranged:  NA HH Agency:  NA  Status of Service:  Completed, signed off  If discussed at [employer] Meetings, dates discussed:    Additional Comments:  Kingsley PlanWile, Alyanna Stoermer Marie, RN 12/26/2017, 10:33 AM

## 2017-12-26 NOTE — Social Work (Signed)
[facility]  All other resources given to pt by ED CSW at admission. Bus pass provided.  CSW signing off. Please consult if any additional needs arise.  Doy HutchingIsabel H Ottie Tillery, LCSWA [facility] Clinical Social Work [phone]

## 2017-12-28 LAB — CULTURE, BLOOD (ROUTINE X 2)
Culture: NO GROWTH
Culture: NO GROWTH

## 2017-12-31 ENCOUNTER — Other Ambulatory Visit: Payer: Self-pay

## 2017-12-31 ENCOUNTER — Emergency Department (HOSPITAL_COMMUNITY)
Admission: EM | Admit: 2017-12-31 | Discharge: 2017-12-31 | Disposition: A | Discharge status: Home / Self Care | Payer: Self-pay | Attending: Emergency Medicine | Admitting: Emergency Medicine

## 2017-12-31 ENCOUNTER — Encounter (HOSPITAL_COMMUNITY): Payer: Self-pay | Admitting: Emergency Medicine

## 2017-12-31 DIAGNOSIS — Z79899 Other long term (current) drug therapy: Secondary | ICD-10-CM | POA: Insufficient documentation

## 2017-12-31 DIAGNOSIS — Z8673 Personal history of transient ischemic attack (TIA), and cerebral infarction without residual deficits: Secondary | ICD-10-CM | POA: Insufficient documentation

## 2017-12-31 DIAGNOSIS — E1165 Type 2 diabetes mellitus with hyperglycemia: Secondary | ICD-10-CM | POA: Insufficient documentation

## 2017-12-31 DIAGNOSIS — Z794 Long term (current) use of insulin: Secondary | ICD-10-CM | POA: Insufficient documentation

## 2017-12-31 DIAGNOSIS — F17210 Nicotine dependence, cigarettes, uncomplicated: Secondary | ICD-10-CM | POA: Insufficient documentation

## 2017-12-31 DIAGNOSIS — I10 Essential (primary) hypertension: Secondary | ICD-10-CM | POA: Insufficient documentation

## 2017-12-31 DIAGNOSIS — R739 Hyperglycemia, unspecified: Secondary | ICD-10-CM

## 2017-12-31 LAB — I-STAT CHEM 8, ED
BUN: 18 mg/dL (ref 6–20)
Calcium, Ion: 1.12 mmol/L — ABNORMAL LOW (ref 1.15–1.40)
Chloride: 98 mmol/L (ref 98–111)
Creatinine, Ser: 0.8 mg/dL (ref 0.61–1.24)
Glucose, Bld: 522 mg/dL (ref 70–99)
HCT: 37 % — ABNORMAL LOW (ref 39.0–52.0)
Hemoglobin: 12.6 g/dL — ABNORMAL LOW (ref 13.0–17.0)
Potassium: 4.4 mmol/L (ref 3.5–5.1)
Sodium: 133 mmol/L — ABNORMAL LOW (ref 135–145)
TCO2: 28 mmol/L (ref 22–32)

## 2017-12-31 MED ORDER — INSULIN ASPART 100 UNIT/ML ~~LOC~~ SOLN
8.0000 [IU] | Freq: Once | SUBCUTANEOUS | Status: AC
  Administered 2017-12-31: 8 [IU] via SUBCUTANEOUS
  Filled 2017-12-31: qty 1

## 2017-12-31 NOTE — ED Triage Notes (Signed)
Per [facility] pt was picked up at "baby momma's" house for hyperglycemia and being non-compliant. She reports to EMS "he needs to be kept in the hospital because he can't care for himself". CBG 539

## 2017-12-31 NOTE — ED Provider Notes (Signed)
[place] DEPT Provider Note   CSN: [id number] Arrival date & time: 12/31/17  1409     History   Chief Complaint Chief Complaint  Patient presents with  . Hyperglycemia    HPI Mike [name] is a 57 y.o. male.  HPI Patient presents with hyperglycemia.  History of same.  Has not been taking care of himself reportedly.  Has had 25 visits to the ER for the same.  History of noncompliance.  Patient has difficulty get history from. Past Medical History:  Diagnosis Date  . Anxiety   . Anxiety   . Chronic lower back pain   . Depression   . DKA (diabetic ketoacidoses) ([place]) 07/30/2016  . Hyperlipemia   . Hypertension   . Migraine    "last one was ~ 4 yr ago" (12/23/2014)  . Seizures ([place])    "related to pills for anxiety; if I don't take the pills I'm suppose to take I'll have them" (12/23/2014)  . Type II diabetes mellitus [facility])     Patient Active Problem List   Diagnosis Date Noted  . Hypotension   . Hypothermia   . 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 Hypernatremia   . Memory difficulties   . Type 2 diabetes mellitus with hyperosmolar nonketotic hyperglycemia ([place]) 08/03/2017  . Hyperglycemia 05/26/2017  . Noncompliance 05/26/2017  . AKI (acute kidney injury) ([place]) 03/04/2017  . Malnutrition of moderate degree 02/08/2017  . DKA (diabetic ketoacidoses) ([place]) 02/07/2017  . History of stroke 02/07/2017  . Hyperphosphatemia 12/13/2016  . Evaluation by psychiatric service required 11/30/2016  . Lactic acidosis 11/18/2016  . Anemia 11/06/2016  . HLD (hyperlipidemia) 11/06/2016  . Adjustment disorder with other symptoms 11/05/2016  . Uncontrolled type 2 diabetes mellitus with hyperosmolar nonketotic hyperglycemia ([place]) 08/24/2016  . Essential hypertension 08/24/2016  . Acute ischemic stroke ([place])   . Protein-calorie malnutrition, severe 12/24/2014  . Homelessness 12/23/2014  . Hypertensive urgency 12/23/2014  . DM (diabetes  mellitus) ([place]) 12/23/2014  . Intermittent palpitations 12/23/2014  . Tobacco abuse 12/23/2014  . Pleuritic chest pain 12/23/2014  . Abnormal EKG 12/23/2014  . Type II diabetes mellitus with renal manifestations [facility])     Past Surgical History:  Procedure Laterality Date  . NO PAST SURGERIES          Home Medications    Prior to Admission medications   Medication Sig Start Date End Date Taking? Authorizing Provider  atorvastatin (LIPITOR) 40 MG tablet Take 1 tablet (40 mg total) by mouth daily. 12/26/17   Rai, Vernelle Emerald, MD  blood glucose meter kit and supplies KIT Dispense based on patient and insurance preference. Use up to four times daily as directed. (FOR ICD-9 250.00, 250.01). 12/26/17   Rai, Ripudeep K, MD  insulin NPH-regular Human (NOVOLIN 70/30) (70-30) 100 UNIT/ML injection Inject 10 Units into the skin 2 (two) times daily with a meal. 12/26/17   Rai, Ripudeep K, MD  Insulin Pen Needle 31G X 6 MM MISC 1 Device by Does not apply route 2 (two) times daily. 12/26/17   Rai, Ripudeep K, MD  lisinopril (PRINIVIL,ZESTRIL) 20 MG tablet Take 1 tablet (20 mg total) by mouth daily. 12/26/17   Rai, Vernelle Emerald, MD  metoprolol tartrate (LOPRESSOR) 25 MG tablet Take 0.5 tablets (12.5 mg total) by mouth 2 (two) times daily. 12/26/17   Mendel Corning, MD    Family History Family History  Problem Relation Age of Onset  . Diabetes Mellitus II Mother   .  Diabetes Mellitus II Father     Social History Social History   Tobacco Use  . Smoking status: Current Every Day Smoker    Packs/day: 0.10    Years: 40.00    Pack years: 4.00    Types: Cigarettes  . Smokeless tobacco: Never Used  Substance Use Topics  . Alcohol use: Yes    Alcohol/week: 2.0 standard drinks    Types: 2 Cans of beer per week    Comment: No labs available at time of assessment  . Drug use: No    Comment: 12/23/2014 "stopped ~ 10 yrs ago"     Allergies   Ibuprofen and Tylenol [acetaminophen]   Review of  Systems Review of Systems  Constitutional: Negative for appetite change.  HENT: Negative for congestion.   Respiratory: Negative for shortness of breath.   Cardiovascular: Negative for chest pain.  Gastrointestinal: Negative for abdominal pain.  Endocrine: Negative for polyuria.  Genitourinary: Negative for flank pain.  Musculoskeletal: Negative for back pain.  Skin: Negative for color change.  Neurological: Positive for weakness.     Physical Exam Updated Vital Signs BP (!) 147/86 (BP Location: Right Arm)   Pulse 75   Temp 98.2 F (36.8 C) (Oral)   Resp 18   Ht _0  (1.88 m)   Wt 77.1 kg   SpO2 100%   BMI 21.82 kg/m   Physical Exam Constitutional:      Comments: Patient is disheveled and smells.  HENT:     Head: Normocephalic.  Eyes:     General:        Right eye: No discharge.        Left eye: No discharge.  Neck:     Musculoskeletal: Neck supple.  Cardiovascular:     Rate and Rhythm: Normal rate.  Abdominal:     Tenderness: There is no abdominal tenderness.  Musculoskeletal:        General: No deformity.  Skin:    General: Skin is warm.     Capillary Refill: Capillary refill takes less than 2 seconds.  Neurological:     Mental Status: Mental status is at baseline.      ED Treatments / Results  Labs (all labs ordered are listed, but only abnormal results are displayed) Labs Reviewed  I-STAT CHEM 8, ED - Abnormal; Notable for the following components:      Result Value   Sodium 133 (*)    Glucose, Bld 522 (*)    Calcium, Ion 1.12 (*)    Hemoglobin 12.6 (*)    HCT 37.0 (*)    All other components within normal limits    EKG None  Radiology No results found.  Procedures Procedures (including critical care time)  Medications Ordered in ED Medications  insulin aspart (novoLOG) injection 8 Units (8 Units Subcutaneous Given 12/31/17 1554)     Initial Impression / Assessment and Plan / ED Course  I have reviewed the triage vital signs and  the nursing notes.  Pertinent labs & imaging results that were available during my care of the patient were reviewed by me and considered in my medical decision making (see chart for details).     Patient with hyperglycemia.  Not in DKA.  History of multiple visits for same.  Reviewed care plan.  Will give subcu insulin and discharge.  Likely will return.  Do not see criteria for admission at this time.  Final Clinical Impressions(s) / ED Diagnoses   Final diagnoses:  Hyperglycemia  ED Discharge Orders    None       Davonna Belling, MD 12/31/17 [phone]

## 2018-01-01 ENCOUNTER — Inpatient Hospital Stay (HOSPITAL_COMMUNITY)
Admission: EM | Admit: 2018-01-01 | Discharge: 2018-01-11 | DRG: 638 | Disposition: A | Discharge status: Home / Self Care | Payer: Self-pay | Attending: Internal Medicine | Admitting: Internal Medicine

## 2018-01-01 ENCOUNTER — Other Ambulatory Visit: Payer: Self-pay

## 2018-01-01 ENCOUNTER — Encounter (HOSPITAL_COMMUNITY): Payer: Self-pay | Admitting: Emergency Medicine

## 2018-01-01 DIAGNOSIS — Z590 Homelessness: Secondary | ICD-10-CM

## 2018-01-01 DIAGNOSIS — I161 Hypertensive emergency: Secondary | ICD-10-CM | POA: Diagnosis present

## 2018-01-01 DIAGNOSIS — Z79899 Other long term (current) drug therapy: Secondary | ICD-10-CM

## 2018-01-01 DIAGNOSIS — N179 Acute kidney failure, unspecified: Secondary | ICD-10-CM | POA: Diagnosis present

## 2018-01-01 DIAGNOSIS — Z9119 Patient's noncompliance with other medical treatment and regimen: Secondary | ICD-10-CM

## 2018-01-01 DIAGNOSIS — I160 Hypertensive urgency: Secondary | ICD-10-CM | POA: Diagnosis present

## 2018-01-01 DIAGNOSIS — Z91128 Patient's intentional underdosing of medication regimen for other reason: Secondary | ICD-10-CM

## 2018-01-01 DIAGNOSIS — E785 Hyperlipidemia, unspecified: Secondary | ICD-10-CM | POA: Diagnosis present

## 2018-01-01 DIAGNOSIS — T383X6A Underdosing of insulin and oral hypoglycemic [antidiabetic] drugs, initial encounter: Secondary | ICD-10-CM | POA: Diagnosis present

## 2018-01-01 DIAGNOSIS — T447X6A Underdosing of beta-adrenoreceptor antagonists, initial encounter: Secondary | ICD-10-CM | POA: Diagnosis present

## 2018-01-01 DIAGNOSIS — Z886 Allergy status to analgesic agent status: Secondary | ICD-10-CM

## 2018-01-01 DIAGNOSIS — T464X6A Underdosing of angiotensin-converting-enzyme inhibitors, initial encounter: Secondary | ICD-10-CM | POA: Diagnosis present

## 2018-01-01 DIAGNOSIS — Z91199 Patient's noncompliance with other medical treatment and regimen due to unspecified reason: Secondary | ICD-10-CM

## 2018-01-01 DIAGNOSIS — E1165 Type 2 diabetes mellitus with hyperglycemia: Secondary | ICD-10-CM | POA: Diagnosis present

## 2018-01-01 DIAGNOSIS — T466X6A Underdosing of antihyperlipidemic and antiarteriosclerotic drugs, initial encounter: Secondary | ICD-10-CM | POA: Diagnosis present

## 2018-01-01 DIAGNOSIS — Z9111 Patient's noncompliance with dietary regimen: Secondary | ICD-10-CM

## 2018-01-01 DIAGNOSIS — L8990 Pressure ulcer of unspecified site, unspecified stage: Secondary | ICD-10-CM

## 2018-01-01 DIAGNOSIS — Z794 Long term (current) use of insulin: Secondary | ICD-10-CM

## 2018-01-01 DIAGNOSIS — I10 Essential (primary) hypertension: Secondary | ICD-10-CM | POA: Diagnosis present

## 2018-01-01 DIAGNOSIS — F17210 Nicotine dependence, cigarettes, uncomplicated: Secondary | ICD-10-CM | POA: Diagnosis present

## 2018-01-01 DIAGNOSIS — Z720 Tobacco use: Secondary | ICD-10-CM | POA: Diagnosis present

## 2018-01-01 DIAGNOSIS — E11649 Type 2 diabetes mellitus with hypoglycemia without coma: Secondary | ICD-10-CM | POA: Diagnosis not present

## 2018-01-01 DIAGNOSIS — Z833 Family history of diabetes mellitus: Secondary | ICD-10-CM

## 2018-01-01 DIAGNOSIS — E1100 Type 2 diabetes mellitus with hyperosmolarity without nonketotic hyperglycemic-hyperosmolar coma (NKHHC): Principal | ICD-10-CM | POA: Diagnosis present

## 2018-01-01 DIAGNOSIS — R0902 Hypoxemia: Secondary | ICD-10-CM | POA: Diagnosis present

## 2018-01-01 DIAGNOSIS — L89312 Pressure ulcer of right buttock, stage 2: Secondary | ICD-10-CM | POA: Diagnosis present

## 2018-01-01 DIAGNOSIS — D638 Anemia in other chronic diseases classified elsewhere: Secondary | ICD-10-CM | POA: Diagnosis present

## 2018-01-01 LAB — CBG MONITORING, ED
GLUCOSE-CAPILLARY: 317 mg/dL — AB (ref 70–99)
Glucose-Capillary: >600 mg/dL (ref 70–99)
Glucose-Capillary: >600 mg/dL (ref 70–99)
Glucose-Capillary: 515 mg/dL (ref 70–99)
Glucose-Capillary: 379 mg/dL — ABNORMAL HIGH (ref 70–99)
Glucose-Capillary: 317 mg/dL — ABNORMAL HIGH (ref 70–99)
Glucose-Capillary: 277 mg/dL — ABNORMAL HIGH (ref 70–99)
Glucose-Capillary: 229 mg/dL — ABNORMAL HIGH (ref 70–99)
Glucose-Capillary: 204 mg/dL — ABNORMAL HIGH (ref 70–99)

## 2018-01-01 LAB — URINALYSIS, ROUTINE W REFLEX MICROSCOPIC
Bacteria, UA: NONE SEEN
Bilirubin Urine: NEGATIVE
Glucose, UA: >=500 mg/dL — AB
Hgb urine dipstick: NEGATIVE
Ketones, ur: 5 mg/dL — AB
Leukocytes, UA: NEGATIVE
Nitrite: NEGATIVE
Protein, ur: NEGATIVE mg/dL
Specific Gravity, Urine: 1.025 (ref 1.005–1.030)
pH: 5 (ref 5.0–8.0)

## 2018-01-01 LAB — BASIC METABOLIC PANEL
Anion gap: 14 (ref 5–15)
BUN: 30 mg/dL — ABNORMAL HIGH (ref 6–20)
CO2: 26 mmol/L (ref 22–32)
Calcium: 9.1 mg/dL (ref 8.9–10.3)
Chloride: 90 mmol/L — ABNORMAL LOW (ref 98–111)
Creatinine, Ser: 1.53 mg/dL — ABNORMAL HIGH (ref 0.61–1.24)
GFR calc Af Amer: 58 mL/min — ABNORMAL LOW (ref >60)
GFR calc non Af Amer: 50 mL/min — ABNORMAL LOW (ref >60)
Glucose, Bld: 1047 mg/dL (ref 70–99)
Potassium: 4.9 mmol/L (ref 3.5–5.1)
Sodium: 130 mmol/L — ABNORMAL LOW (ref 135–145)

## 2018-01-01 LAB — CBC WITH DIFFERENTIAL/PLATELET
Abs Immature Granulocytes: 0.02 10*3/uL (ref 0.00–0.07)
Basophils Absolute: 0 10*3/uL (ref 0.0–0.1)
Basophils Relative: 0 %
Eosinophils Absolute: 0 10*3/uL (ref 0.0–0.5)
Eosinophils Relative: 0 %
HCT: 31.9 % — ABNORMAL LOW (ref 39.0–52.0)
Hemoglobin: 9.6 g/dL — ABNORMAL LOW (ref 13.0–17.0)
Immature Granulocytes: 0 %
Lymphocytes Relative: 13 %
Lymphs Abs: 1 10*3/uL (ref 0.7–4.0)
MCH: 25.3 pg — ABNORMAL LOW (ref 26.0–34.0)
MCHC: 30.1 g/dL (ref 30.0–36.0)
MCV: 83.9 fL (ref 80.0–100.0)
Monocytes Absolute: 0.4 10*3/uL (ref 0.1–1.0)
Monocytes Relative: 6 %
Neutro Abs: 6.3 10*3/uL (ref 1.7–7.7)
Neutrophils Relative %: 81 %
Platelets: 453 10*3/uL — ABNORMAL HIGH (ref 150–400)
RBC: 3.8 MIL/uL — ABNORMAL LOW (ref 4.22–5.81)
RDW: 13.3 % (ref 11.5–15.5)
WBC: 7.8 10*3/uL (ref 4.0–10.5)
nRBC: 0 % (ref 0.0–0.2)

## 2018-01-01 LAB — BLOOD GAS, VENOUS
Acid-Base Excess: 2.6 mmol/L — ABNORMAL HIGH (ref 0.0–2.0)
Bicarbonate: 28.4 mmol/L — ABNORMAL HIGH (ref 20.0–28.0)
O2 Saturation: 26.8 %
Patient temperature: 98.6
pCO2, Ven: 52.9 mmHg (ref 44.0–60.0)
pH, Ven: 7.349 (ref 7.250–7.430)

## 2018-01-01 LAB — GLUCOSE, CAPILLARY
Glucose-Capillary: 210 mg/dL — ABNORMAL HIGH (ref 70–99)
Glucose-Capillary: 102 mg/dL — ABNORMAL HIGH (ref 70–99)

## 2018-01-01 LAB — I-STAT CHEM 8, ED
BUN: 29 mg/dL — ABNORMAL HIGH (ref 6–20)
Calcium, Ion: 1.13 mmol/L — ABNORMAL LOW (ref 1.15–1.40)
Chloride: 91 mmol/L — ABNORMAL LOW (ref 98–111)
Creatinine, Ser: 1.4 mg/dL — ABNORMAL HIGH (ref 0.61–1.24)
Glucose, Bld: >700 mg/dL (ref 70–99)
HCT: 39 % (ref 39.0–52.0)
Hemoglobin: 13.3 g/dL (ref 13.0–17.0)
Potassium: 5.3 mmol/L — ABNORMAL HIGH (ref 3.5–5.1)
Sodium: 125 mmol/L — ABNORMAL LOW (ref 135–145)
TCO2: 28 mmol/L (ref 22–32)

## 2018-01-01 LAB — ETHANOL

## 2018-01-01 MED ORDER — SODIUM CHLORIDE 0.9 % IV SOLN
INTRAVENOUS | Status: DC
  Administered 2018-01-01 – 2018-01-03 (×6): via INTRAVENOUS

## 2018-01-01 MED ORDER — METOPROLOL TARTRATE 12.5 MG HALF TABLET
12.5000 mg | ORAL_TABLET | Freq: Two times a day (BID) | ORAL | Status: DC
  Administered 2018-01-01 – 2018-01-11 (×20): 12.5 mg via ORAL
  Filled 2018-01-01 (×20): qty 1

## 2018-01-01 MED ORDER — DEXTROSE-NACL 5-0.45 % IV SOLN
INTRAVENOUS | Status: DC
  Administered 2018-01-01: 14:00:00 via INTRAVENOUS

## 2018-01-01 MED ORDER — INSULIN REGULAR(HUMAN) IN NACL 100-0.9 UT/100ML-% IV SOLN
INTRAVENOUS | Status: DC
  Administered 2018-01-01: 5.4 [IU]/h via INTRAVENOUS
  Filled 2018-01-01: qty 100

## 2018-01-01 MED ORDER — ACETAMINOPHEN 325 MG PO TABS: 650.0000 mg | ORAL_TABLET | Freq: Four times a day (QID) | ORAL | Status: DC | PRN

## 2018-01-01 MED ORDER — ATORVASTATIN CALCIUM 40 MG PO TABS
40.0000 mg | ORAL_TABLET | Freq: Every day | ORAL | Status: DC
  Administered 2018-01-01 – 2018-01-11 (×11): 40 mg via ORAL
  Filled 2018-01-01 (×11): qty 1

## 2018-01-01 MED ORDER — POTASSIUM CHLORIDE 10 MEQ/100ML IV SOLN: 10.0000 meq | INTRAVENOUS | Status: DC

## 2018-01-01 MED ORDER — INSULIN ASPART PROT & ASPART (70-30 MIX) 100 UNIT/ML ~~LOC~~ SUSP
10.0000 [IU] | Freq: Once | SUBCUTANEOUS | Status: AC
  Administered 2018-01-01: 10 [IU] via SUBCUTANEOUS
  Filled 2018-01-01: qty 10

## 2018-01-01 MED ORDER — INSULIN ASPART 100 UNIT/ML ~~LOC~~ SOLN
0.0000 [IU] | Freq: Three times a day (TID) | SUBCUTANEOUS | Status: DC
  Administered 2018-01-01: 7 [IU] via SUBCUTANEOUS
  Administered 2018-01-02: 11 [IU] via SUBCUTANEOUS
  Administered 2018-01-02: 20 [IU] via SUBCUTANEOUS
  Administered 2018-01-02: 15 [IU] via SUBCUTANEOUS
  Administered 2018-01-03: 4 [IU] via SUBCUTANEOUS
  Administered 2018-01-03: 15 [IU] via SUBCUTANEOUS
  Administered 2018-01-03: 11 [IU] via SUBCUTANEOUS
  Administered 2018-01-04: 7 [IU] via SUBCUTANEOUS
  Administered 2018-01-04 – 2018-01-05 (×3): 15 [IU] via SUBCUTANEOUS
  Administered 2018-01-05 – 2018-01-06 (×3): 7 [IU] via SUBCUTANEOUS
  Administered 2018-01-07: 11 [IU] via SUBCUTANEOUS
  Administered 2018-01-07: 3 [IU] via SUBCUTANEOUS
  Administered 2018-01-07 – 2018-01-08 (×2): 7 [IU] via SUBCUTANEOUS
  Administered 2018-01-08: 3 [IU] via SUBCUTANEOUS
  Administered 2018-01-09: 4 [IU] via SUBCUTANEOUS
  Administered 2018-01-09: 11 [IU] via SUBCUTANEOUS
  Administered 2018-01-09: 4 [IU] via SUBCUTANEOUS
  Administered 2018-01-10 (×2): 7 [IU] via SUBCUTANEOUS
  Administered 2018-01-10: 11 [IU] via SUBCUTANEOUS
  Administered 2018-01-11: 4 [IU] via SUBCUTANEOUS

## 2018-01-01 MED ORDER — INSULIN ASPART 100 UNIT/ML ~~LOC~~ SOLN
0.0000 [IU] | Freq: Every day | SUBCUTANEOUS | Status: DC
  Administered 2018-01-02: 2 [IU] via SUBCUTANEOUS
  Administered 2018-01-03: 3 [IU] via SUBCUTANEOUS
  Administered 2018-01-05: 2 [IU] via SUBCUTANEOUS

## 2018-01-01 MED ORDER — HYDRALAZINE HCL 20 MG/ML IJ SOLN
10.0000 mg | Freq: Once | INTRAMUSCULAR | Status: AC
  Administered 2018-01-01: 10 mg via INTRAVENOUS
  Filled 2018-01-01: qty 1

## 2018-01-01 MED ORDER — POTASSIUM CHLORIDE 10 MEQ/100ML IV SOLN
10.0000 meq | INTRAVENOUS | Status: AC
  Administered 2018-01-01 (×2): 10 meq via INTRAVENOUS
  Filled 2018-01-01 (×2): qty 100

## 2018-01-01 MED ORDER — ACETAMINOPHEN 650 MG RE SUPP: 650.0000 mg | Freq: Four times a day (QID) | RECTAL | Status: DC | PRN

## 2018-01-01 MED ORDER — SODIUM CHLORIDE 0.9 % IV BOLUS
1000.0000 mL | Freq: Once | INTRAVENOUS | Status: AC
  Administered 2018-01-01: 1000 mL via INTRAVENOUS

## 2018-01-01 MED ORDER — ENOXAPARIN SODIUM 40 MG/0.4ML ~~LOC~~ SOLN
40.0000 mg | SUBCUTANEOUS | Status: DC
  Administered 2018-01-01 – 2018-01-07 (×7): 40 mg via SUBCUTANEOUS
  Filled 2018-01-01 (×9): qty 0.4

## 2018-01-01 MED ORDER — INSULIN REGULAR(HUMAN) IN NACL 100-0.9 UT/100ML-% IV SOLN: INTRAVENOUS | Status: DC

## 2018-01-01 MED ORDER — LISINOPRIL 20 MG PO TABS
20.0000 mg | ORAL_TABLET | Freq: Once | ORAL | Status: AC
  Administered 2018-01-01: 20 mg via ORAL
  Filled 2018-01-01: qty 1

## 2018-01-01 MED ORDER — HYDRALAZINE HCL 20 MG/ML IJ SOLN
5.0000 mg | Freq: Once | INTRAMUSCULAR | Status: AC
  Administered 2018-01-02: 5 mg via INTRAVENOUS
  Filled 2018-01-01: qty 1

## 2018-01-01 MED ORDER — LISINOPRIL 20 MG PO TABS
20.0000 mg | ORAL_TABLET | Freq: Every day | ORAL | Status: DC
  Administered 2018-01-01 – 2018-01-11 (×11): 20 mg via ORAL
  Filled 2018-01-01 (×2): qty 1
  Filled 2018-01-01 (×3): qty 2
  Filled 2018-01-01 (×6): qty 1

## 2018-01-01 MED ORDER — ADULT MULTIVITAMIN W/MINERALS CH
1.0000 | ORAL_TABLET | Freq: Every day | ORAL | Status: DC
  Administered 2018-01-01 – 2018-01-04 (×4): 1 via ORAL
  Filled 2018-01-01 (×4): qty 1

## 2018-01-01 MED ORDER — INSULIN ASPART PROT & ASPART (70-30 MIX) 100 UNIT/ML ~~LOC~~ SUSP
10.0000 [IU] | Freq: Two times a day (BID) | SUBCUTANEOUS | Status: DC
  Administered 2018-01-01 – 2018-01-02 (×2): 10 [IU] via SUBCUTANEOUS
  Filled 2018-01-01: qty 10

## 2018-01-01 NOTE — ED Notes (Signed)
Bed: WTR5 Expected date:  Expected time:  Means of arrival:  Comments: 

## 2018-01-01 NOTE — ED Notes (Signed)
hospitalist  notified regarding CBG.

## 2018-01-01 NOTE — H&P (Signed)
History and Physical   Mike [name] ZES:[id number] DOB: 1960-04-10 DOA: 01/01/2018  Referring MD/NP/PA: Vivi Martens, md  PCP: [name], NP   Outpatient Specialists: None  Patient coming from: Patient is homeless  Chief Complaint: Weakness  HPI: Mike [name] is a 57 y.o. male with medical history significant of diabetes, hypertension, hyperlipidemia, [place] noncompliance, homelessness, anxiety disorder who was brought in by EMS due to dizziness and hypoglycemia.  Patient was found to have blood glucose of more than 1000.  His blood pressure was 216/95.  Patient was slightly altered in his mental status.  He was fully awake however in terms of response.  He reported taking his medication because he was out.  He is got his insulin but storage is a problem been displaced.  Patient was given IV fluids initial evaluation and is being admitted to the hospital with significant hypoxemia.  No obvious anion gap and no significant ketonuria.  ED Course: Patient's initial vitals include temperature of 99, blood pressure 216/95, pulse 78, respiratory of 20, oxygen sat 100% room air.  Sodium was 130 potassium 4.9 chloride 90 CO2 26 glucose 1047 creatinine 1.53 and BUN of 30.  His gap was 14.  ABG showed a pH of 7.349.  CBC appears stable.  Patient received immediate 2 L of normal saline bolus.  Initiate her on insulin drip and is being admitted with hyperosmolar nonketotic hyperglycemia.  Review of Systems: As per HPI otherwise 10 point review of systems negative.    Past Medical History:  Diagnosis Date  . Anxiety   . Anxiety   . Chronic lower back pain   . Depression   . DKA (diabetic ketoacidoses) ([place]) 07/30/2016  . Hyperlipemia   . Hypertension   . Migraine    "last one was ~ 4 yr ago" (12/23/2014)  . Seizures ([place])    "related to pills for anxiety; if I don't take the pills I'm suppose to take I'll have them" (12/23/2014)  . Type II diabetes mellitus ([place])     Past  Surgical History:  Procedure Laterality Date  . NO PAST SURGERIES       reports that he has been smoking cigarettes. He has a 4.00 pack-year smoking history. He has never used smokeless tobacco. He reports current alcohol use of about 2.0 standard drinks of alcohol per week. He reports that he does not use drugs.  Allergies  Allergen Reactions  . Ibuprofen Nausea And Vomiting and Other (See Comments)    Makes the patient feel bloated also  . Tylenol [Acetaminophen] Nausea And Vomiting and Other (See Comments)    Makes the patient feel bloated also    Family History  Problem Relation Age of Onset  . Diabetes Mellitus II Mother   . Diabetes Mellitus II Father      Prior to Admission medications   Medication Sig Start Date End Date Taking? Authorizing Provider  atorvastatin (LIPITOR) 40 MG tablet Take 1 tablet (40 mg total) by mouth daily. 12/26/17  Yes Rai, Ripudeep K, MD  blood glucose meter kit and supplies KIT Dispense based on patient and insurance preference. Use up to four times daily as directed. (FOR ICD-9 250.00, 250.01). 12/26/17  Yes Rai, Ripudeep K, MD  insulin NPH-regular Human (NOVOLIN 70/30) (70-30) 100 UNIT/ML injection Inject 10 Units into the skin 2 (two) times daily with a meal. 12/26/17  Yes Rai, Ripudeep K, MD  Insulin Pen Needle 31G X 6 MM MISC 1 Device by Does not apply route  2 (two) times daily. 12/26/17  Yes Rai, Ripudeep K, MD  lisinopril (PRINIVIL,ZESTRIL) 20 MG tablet Take 1 tablet (20 mg total) by mouth daily. 12/26/17  Yes Rai, Ripudeep K, MD  metoprolol tartrate (LOPRESSOR) 25 MG tablet Take 0.5 tablets (12.5 mg total) by mouth 2 (two) times daily. 12/26/17   Mendel Corning, MD    Physical Exam: Vitals:   01/01/18 0500 01/01/18 0518 01/01/18 0530 01/01/18 0706  BP:  (!) 163/79 (!) 174/90 (!) 168/83  Pulse:  71 78 78  Resp: '16 16 15 16  '$ SpO2:  100% 100% 100%      Constitutional: Disheveled, unkempt, NAD, calm, comfortable Vitals:   01/01/18  0500 01/01/18 0518 01/01/18 0530 01/01/18 0706  BP:  (!) 163/79 (!) 174/90 (!) 168/83  Pulse:  71 78 78  Resp: '16 16 15 16  '$ SpO2:  100% 100% 100%   Eyes: PERRL, lids and conjunctivae normal ENMT: Mucous membranes are dry. Posterior pharynx clear of any exudate or lesions.Normal dentition.  Neck: normal, supple, no masses, no thyromegaly Respiratory: clear to auscultation bilaterally, no wheezing, no crackles. Normal respiratory effort. No accessory muscle use.  Cardiovascular: Regular rate and rhythm, no murmurs / rubs / gallops. No extremity edema. 2+ pedal pulses. No carotid bruits.  Abdomen: no tenderness, no masses palpated. No hepatosplenomegaly. Bowel sounds positive.  Musculoskeletal: no clubbing / cyanosis. No joint deformity upper and lower extremities. Good ROM, no contractures. Normal muscle tone.  Skin: no rashes, lesions, ulcers. No induration Neurologic: CN 2-12 grossly intact. Sensation intact, DTR normal. Strength 5/5 in all 4.  Psychiatric: Normal judgment and insight. Alert and oriented x 3. Normal mood.     Labs on Admission: I have personally reviewed following labs and imaging studies  CBC: Recent Labs  Lab 12/31/17 1430 01/01/18 0149 01/01/18 0319  WBC  --   --  7.8  NEUTROABS  --   --  6.3  HGB 12.6* 13.3 9.6*  HCT 37.0* 39.0 31.9*  MCV  --   --  83.9  PLT  --   --  390*   Basic Metabolic Panel: Recent Labs  Lab 12/26/17 0144 12/31/17 1430 01/01/18 0149 01/01/18 0319  NA 136 133* 125* 130*  K 4.0 4.4 5.3* 4.9  CL 104 98 91* 90*  CO2 26  --   --  26  GLUCOSE 227* 522* >700* 1,047*  BUN 15 18 29* 30*  CREATININE 0.82 0.80 1.40* 1.53*  CALCIUM 8.5*  --   --  9.1   GFR: Estimated Creatinine Clearance: 58.8 mL/min (A) (by C-G formula based on SCr of 1.53 mg/dL (H)). Liver Function Tests: No results for input(s): AST, ALT, ALKPHOS, BILITOT, PROT, ALBUMIN in the last 168 hours. No results for input(s): LIPASE, AMYLASE in the last 168 hours. No  results for input(s): AMMONIA in the last 168 hours. Coagulation Profile: No results for input(s): INR, PROTIME in the last 168 hours. Cardiac Enzymes: No results for input(s): CKTOTAL, CKMB, CKMBINDEX, TROPONINI in the last 168 hours. BNP (last 3 results) No results for input(s): PROBNP in the last 8760 hours. HbA1C: No results for input(s): HGBA1C in the last 72 hours. CBG: Recent Labs  Lab 12/26/17 0855 01/01/18 0141 01/01/18 0434 01/01/18 0543 01/01/18 0652  GLUCAP 92 >600* >600* >600* 515*   Lipid Profile: No results for input(s): CHOL, HDL, LDLCALC, TRIG, CHOLHDL, LDLDIRECT in the last 72 hours. Thyroid Function Tests: No results for input(s): TSH, T4TOTAL, FREET4, T3FREE, THYROIDAB in the  last 72 hours. Anemia Panel: No results for input(s): VITAMINB12, FOLATE, FERRITIN, TIBC, IRON, RETICCTPCT in the last 72 hours. Urine analysis:    Component Value Date/Time   COLORURINE COLORLESS (A) 01/01/2018 0454   APPEARANCEUR CLEAR 01/01/2018 0454   LABSPEC 1.025 01/01/2018 0454   PHURINE 5.0 01/01/2018 0454   GLUCOSEU >=500 (A) 01/01/2018 0454   HGBUR NEGATIVE 01/01/2018 0454   BILIRUBINUR NEGATIVE 01/01/2018 0454   KETONESUR 5 (A) 01/01/2018 0454   PROTEINUR NEGATIVE 01/01/2018 0454   UROBILINOGEN 1.0 02/07/2011 1605   NITRITE NEGATIVE 01/01/2018 0454   LEUKOCYTESUR NEGATIVE 01/01/2018 0454   Sepsis Labs: '@LABRCNTIP'$ (procalcitonin:4,lacticidven:4) ) Recent Results (from the past 240 hour(s))  Urine culture     Status: Abnormal   Collection Time: 12/23/17  5:17 AM  Result Value Ref Range Status   Specimen Description URINE, RANDOM  Final   Special Requests   Final    NONE Performed at [facility], [address] [place], [place] [address]    Culture MULTIPLE SPECIES PRESENT, SUGGEST RECOLLECTION (A)  Final   Report Status 12/24/2017 FINAL  Final  Blood culture (routine x 2)     Status: None   Collection Time: 12/23/17  5:30 AM  Result Value Ref Range  Status   Specimen Description BLOOD RIGHT HAND  Final   Special Requests   Final    BOTTLES DRAWN AEROBIC ONLY Blood Culture results may not be optimal due to an inadequate volume of blood received in culture bottles   Culture   Final    NO GROWTH 5 DAYS Performed at [facility], [place] [address], [place], [place] [address]    Report Status 12/28/2017 FINAL  Final  Blood culture (routine x 2)     Status: None   Collection Time: 12/23/17  5:47 AM  Result Value Ref Range Status   Specimen Description BLOOD RIGHT HAND  Final   Special Requests   Final    BOTTLES DRAWN AEROBIC AND ANAEROBIC Blood Culture results may not be optimal due to an inadequate volume of blood received in culture bottles   Culture   Final    NO GROWTH 5 DAYS Performed at [facility], [place] [address], [place], [place] [address]    Report Status 12/28/2017 FINAL  Final  MRSA PCR Screening     Status: None   Collection Time: 12/23/17  8:13 AM  Result Value Ref Range Status   MRSA by PCR NEGATIVE NEGATIVE Final    Comment:        The GeneXpert MRSA Assay (FDA approved for NASAL specimens only), is one component of a comprehensive MRSA colonization surveillance program. It is not intended to diagnose MRSA infection nor to guide or monitor treatment for MRSA infections. Performed at [facility], [place] [address] [place], [place] [address]      Radiological Exams on Admission: No results found.  Assessment/Plan Principal Problem:   Uncontrolled type 2 diabetes mellitus with hyperosmolar nonketotic hyperglycemia (HCC) Active Problems:   Hypertensive urgency   Tobacco abuse   Noncompliance   Hyperglycemia due to type 2 diabetes mellitus ([place])     #1 hyperosmolar nonketotic hyperglycemia: Patient will be admitted to the hospital.  Blood sugar has already improved.  Initially plan is for stepdown bed but he is doing better.  Continue insulin drip with IV fluids until blood sugar  stabilizes.  At that point we will switch patient to subcutaneous insulin with sliding scale  insulin.  Continue counseling of the patient regarding medication usage.  Social worker consultation regarding home situation.  #2 tobacco abuse: Counseling provided.  Patient was given nicotine patch.  #3 hypertension: Due to patient's inability to take home medications.  PRN medications given including labetalol.  Currently started on home regimen of blood pressure already improving.  Continue adjustment.  #4 hyperlipidemia: Resume statin from home.  #5 homelessness: Hotel manager.  #6 medication noncompliance: Largely due to homelessness.   DVT prophylaxis: Lovenox Code Status: Full code Family Communication: He has no family available but discussed at length with him Disposition Plan: To be determined.  He is homeless. Consults called: None Admission status: Inpatient  Severity of Illness: The appropriate patient status for this patient is INPATIENT. Inpatient status is judged to be reasonable and necessary in order to provide the required intensity of service to ensure the patient's safety. The patient's presenting symptoms, physical exam findings, and initial radiographic and laboratory data in the context of their chronic comorbidities is felt to place them at high risk for further clinical deterioration. Furthermore, it is not anticipated that the patient will be medically stable for discharge from the hospital within 2 midnights of admission. The following factors support the patient status of inpatient.   " The patient's presenting symptoms include dizziness and weakness. " The worrisome physical exam findings include patient is disheveled and appears unkept. " The initial radiographic and laboratory data are worrisome because of blood sugar of more than 8000. " The chronic co-morbidities include diabetes with hypertension.   * I certify that at the point of admission it is  my clinical judgment that the patient will require inpatient hospital care spanning beyond 2 midnights from the point of admission due to high intensity of service, high risk for further deterioration and high frequency of surveillance required.Barbette Merino MD [facility] Pager [phone]  If 7PM-7AM, please contact night-coverage [email or web address] Password [email or web address]  01/01/2018, 8:02 AM

## 2018-01-01 NOTE — ED Notes (Signed)
I gave critical I Stat Chem 8 results to MD Freida BusmanAllen

## 2018-01-01 NOTE — ED Provider Notes (Addendum)
[facility] DEPT Provider Note   CSN: [id number] Arrival date & time: 01/01/18  0030     History   Chief Complaint Chief Complaint  Patient presents with  . Hyperglycemia    HPI Mike [name] is a 57 y.o. male with history of homelessness, hypertension, hyperlipidemia, poorly controlled diabetes known to be noncompliant with his medications presents for evaluation of hyperglycemia and hypertension.  Well-known to this ED with 26 visits in the last 6 months for similar complaints.  Seen yesterday for hyperglycemia.  Has a care plan in place.  Reports he took his medication today but always tells me that he has taken his medications.  He denies any complaints including headache, chest pain, abdominal pain, nausea, vomiting, or urinary symptoms.  He is a poor historian.  The history is provided by the patient.    Past Medical History:  Diagnosis Date  . Anxiety   . Anxiety   . Chronic lower back pain   . Depression   . DKA (diabetic ketoacidoses) ([place]) 07/30/2016  . Hyperlipemia   . Hypertension   . Migraine    "last one was ~ 4 yr ago" (12/23/2014)  . Seizures ([place])    "related to pills for anxiety; if I don't take the pills I'm suppose to take I'll have them" (12/23/2014)  . Type II diabetes mellitus [facility])     Patient Active Problem List   Diagnosis Date Noted  . Hyperglycemia due to type 2 diabetes mellitus ([place]) 01/01/2018  . Hypotension   . Hypothermia   . 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 Hypernatremia   . Memory difficulties   . Type 2 diabetes mellitus with hyperosmolar nonketotic hyperglycemia ([place]) 08/03/2017  . Hyperglycemia 05/26/2017  . Noncompliance 05/26/2017  . AKI (acute kidney injury) ([place]) 03/04/2017  . Malnutrition of moderate degree 02/08/2017  . DKA (diabetic ketoacidoses) ([place]) 02/07/2017  . History of stroke 02/07/2017  . Hyperphosphatemia 12/13/2016  . Evaluation by psychiatric service required  11/30/2016  . Lactic acidosis 11/18/2016  . Anemia 11/06/2016  . HLD (hyperlipidemia) 11/06/2016  . Adjustment disorder with other symptoms 11/05/2016  . Uncontrolled type 2 diabetes mellitus with hyperosmolar nonketotic hyperglycemia ([place]) 08/24/2016  . Essential hypertension 08/24/2016  . Acute ischemic stroke ([place])   . Protein-calorie malnutrition, severe 12/24/2014  . Homelessness 12/23/2014  . Hypertensive urgency 12/23/2014  . DM (diabetes mellitus) ([place]) 12/23/2014  . Intermittent palpitations 12/23/2014  . Tobacco abuse 12/23/2014  . Pleuritic chest pain 12/23/2014  . Abnormal EKG 12/23/2014  . Type II diabetes mellitus with renal manifestations [facility])     Past Surgical History:  Procedure Laterality Date  . NO PAST SURGERIES          Home Medications    Prior to Admission medications   Medication Sig Start Date End Date Taking? Authorizing Provider  atorvastatin (LIPITOR) 40 MG tablet Take 1 tablet (40 mg total) by mouth daily. 12/26/17  Yes Rai, Ripudeep K, MD  blood glucose meter kit and supplies KIT Dispense based on patient and insurance preference. Use up to four times daily as directed. (FOR ICD-9 250.00, 250.01). 12/26/17  Yes Rai, Ripudeep K, MD  insulin NPH-regular Human (NOVOLIN 70/30) (70-30) 100 UNIT/ML injection Inject 10 Units into the skin 2 (two) times daily with a meal. 12/26/17  Yes Rai, Ripudeep K, MD  Insulin Pen Needle 31G X 6 MM MISC 1 Device by Does not apply route 2 (two) times daily. 12/26/17  Yes Rai, Ripudeep  K, MD  lisinopril (PRINIVIL,ZESTRIL) 20 MG tablet Take 1 tablet (20 mg total) by mouth daily. 12/26/17  Yes Rai, Ripudeep K, MD  metoprolol tartrate (LOPRESSOR) 25 MG tablet Take 0.5 tablets (12.5 mg total) by mouth 2 (two) times daily. 12/26/17   Mendel Corning, MD    Family History Family History  Problem Relation Age of Onset  . Diabetes Mellitus II Mother   . Diabetes Mellitus II Father     Social History Social History    Tobacco Use  . Smoking status: Current Every Day Smoker    Packs/day: 0.10    Years: 40.00    Pack years: 4.00    Types: Cigarettes  . Smokeless tobacco: Never Used  Substance Use Topics  . Alcohol use: Yes    Alcohol/week: 2.0 standard drinks    Types: 2 Cans of beer per week    Comment: No labs available at time of assessment  . Drug use: No    Comment: 12/23/2014 "stopped ~ 10 yrs ago"     Allergies   Ibuprofen and Tylenol [acetaminophen]   Review of Systems Review of Systems  Respiratory: Negative for shortness of breath.   Cardiovascular: Negative for chest pain.  Gastrointestinal: Negative for abdominal pain, nausea and vomiting.  All other systems reviewed and are negative.    Physical Exam Updated Vital Signs BP (!) 168/83   Pulse 78   Resp 16   SpO2 100%   Physical Exam Vitals signs and nursing note reviewed.  Constitutional:      General: He is not in acute distress.    Appearance: He is well-developed.     Comments: Resting comfortably in bed.  Wearing paper scrubs.  HENT:     Head: Normocephalic and atraumatic.  Eyes:     General:        Right eye: No discharge.        Left eye: No discharge.     Conjunctiva/sclera: Conjunctivae normal.  Neck:     Musculoskeletal: Normal range of motion and neck supple.     Vascular: No JVD.     Trachea: No tracheal deviation.  Cardiovascular:     Rate and Rhythm: Normal rate.  Pulmonary:     Effort: Pulmonary effort is normal.     Breath sounds: Normal breath sounds.  Abdominal:     General: There is no distension.     Tenderness: There is no abdominal tenderness. There is no guarding.  Skin:    Findings: No erythema.  Neurological:     General: No focal deficit present.     Mental Status: He is alert.  Psychiatric:        Behavior: Behavior normal.      ED Treatments / Results  Labs (all labs ordered are listed, but only abnormal results are displayed) Labs Reviewed  BASIC METABOLIC PANEL -  Abnormal; Notable for the following components:      Result Value   Sodium 130 (*)    Chloride 90 (*)    Glucose, Bld 1,047 (*)    BUN 30 (*)    Creatinine, Ser 1.53 (*)    GFR calc non Af Amer 50 (*)    GFR calc Af Amer 58 (*)    All other components within normal limits  CBC WITH DIFFERENTIAL/PLATELET - Abnormal; Notable for the following components:   RBC 3.80 (*)    Hemoglobin 9.6 (*)    HCT 31.9 (*)    MCH 25.3 (*)  Platelets 453 (*)    All other components within normal limits  BLOOD GAS, VENOUS - Abnormal; Notable for the following components:   Bicarbonate 28.4 (*)    Acid-Base Excess 2.6 (*)    All other components within normal limits  URINALYSIS, ROUTINE W REFLEX MICROSCOPIC - Abnormal; Notable for the following components:   Color, Urine COLORLESS (*)    Glucose, UA >=500 (*)    Ketones, ur 5 (*)    All other components within normal limits  I-STAT CHEM 8, ED - Abnormal; Notable for the following components:   Sodium 125 (*)    Potassium 5.3 (*)    Chloride 91 (*)    BUN 29 (*)    Creatinine, Ser 1.40 (*)    Glucose, Bld >700 (*)    Calcium, Ion 1.13 (*)    All other components within normal limits  CBG MONITORING, ED - Abnormal; Notable for the following components:   Glucose-Capillary >600 (*)    All other components within normal limits  CBG MONITORING, ED - Abnormal; Notable for the following components:   Glucose-Capillary >600 (*)    All other components within normal limits  CBG MONITORING, ED - Abnormal; Notable for the following components:   Glucose-Capillary >600 (*)    All other components within normal limits  ETHANOL    EKG None  Radiology No results found.  Procedures .Critical Care Performed by: Renita Papa, PA-C Authorized by: Renita Papa, PA-C   Critical care provider statement:    Critical care time (minutes):  40   Critical care was necessary to treat or prevent imminent or life-threatening deterioration of the  following conditions:  Metabolic crisis and renal failure   Critical care was time spent personally by me on the following activities:  Discussions with consultants, evaluation of patient's response to treatment, examination of patient, ordering and performing treatments and interventions, ordering and review of laboratory studies, pulse oximetry, re-evaluation of patient's condition, obtaining history from patient or surrogate and review of old charts   I assumed direction of critical care for this patient from another provider in my specialty: no     (including critical care time)  Medications Ordered in ED Medications  insulin regular, human (MYXREDLIN) 100 units/ 100 mL infusion (5.4 Units/hr Intravenous New Bag/Given 01/01/18 0441)  sodium chloride 0.9 % bolus 1,000 mL (1,000 mLs Intravenous New Bag/Given 01/01/18 0429)    And  0.9 %  sodium chloride infusion ( Intravenous New Bag/Given 01/01/18 0522)  dextrose 5 %-0.45 % sodium chloride infusion (has no administration in time range)  potassium chloride 10 mEq in 100 mL IVPB (0 mEq Intravenous Stopped 01/01/18 0714)  sodium chloride 0.9 % bolus 1,000 mL (0 mLs Intravenous Stopped 01/01/18 0553)  insulin aspart protamine- aspart (NOVOLOG MIX 70/30) injection 10 Units (10 Units Subcutaneous Given 01/01/18 0405)  hydrALAZINE (APRESOLINE) injection 10 mg (10 mg Intravenous Given 01/01/18 0407)  lisinopril (PRINIVIL,ZESTRIL) tablet 20 mg (20 mg Oral Given 01/01/18 0407)     Initial Impression / Assessment and Plan / ED Course  I have reviewed the triage vital signs and the nursing notes.  Pertinent labs & imaging results that were available during my care of the patient were reviewed by me and considered in my medical decision making (see chart for details).     Patient presenting for evaluation of hypertension and hyperglycemia.  He is seen in the ED frequently for these complaints.  He is afebrile, quite hypertensive in  the ED.  He  appears physically at his baseline.  Point-of-care CBG was significantly elevated so a BMP and CBC were obtained.  Labs show AKI and severe hyperglycemia with glucose greater than 1000.  Does not appear to be in DKA as anion gap is within normal limits.  VBG shows pH within normal limits.  He was given IV hypertension medications, IV fluids, and started on an insulin drip.  Spoke with Dr. Tamala Julian with [facility] who agrees to assume care of patient and bring him into the hospital for further evaluation and management.  Critical care services were rendered during this visit.  Final Clinical Impressions(s) / ED Diagnoses   Final diagnoses:  Severe hyperglycemia due to diabetes mellitus [facility])  Hypertensive emergency  AKI (acute kidney injury) [facility])    ED Discharge Orders    None        Debroah Baller 01/01/18 0717    Lacretia Leigh, MD 01/02/18 [phone]

## 2018-01-01 NOTE — Care Plan (Signed)
Mike [name] is a 57 year old male with pmh of  HTN, HLD, DM type II, depression, anxiety, and homelessness; who presents via EMS for dizziness and hyperglycemia.  On admission patient was noted to have blood pressure elevated up to 216/95, all other vital signs maintained.  Labs revealed sodium 130, chloride 90, CO2 26, BUN 30, creatinine 1.3, glucose 1047, and anion gap 14.  Patient was given 2 L of normal saline IV fluid, potassium chloride 20 mEq, and then started on insulin drip.  Urinalysis and venous blood gas pending.  Accepted to stepdown bed for hyperglycemia with acute kidney injury.

## 2018-01-01 NOTE — ED Triage Notes (Signed)
Pt arriving via [facility] for hyperglycemia and dizziness. [place]. PT A&O x4 and ambulatory.  CBG 496

## 2018-01-02 LAB — COMPREHENSIVE METABOLIC PANEL
ALT: 20 U/L (ref 0–44)
AST: 21 U/L (ref 15–41)
Albumin: 2.8 g/dL — ABNORMAL LOW (ref 3.5–5.0)
Alkaline Phosphatase: 76 U/L (ref 38–126)
Anion gap: 8 (ref 5–15)
BUN: 20 mg/dL (ref 6–20)
CO2: 22 mmol/L (ref 22–32)
Calcium: 8.2 mg/dL — ABNORMAL LOW (ref 8.9–10.3)
Chloride: 106 mmol/L (ref 98–111)
Creatinine, Ser: 0.72 mg/dL (ref 0.61–1.24)
GFR calc Af Amer: >60 mL/min (ref >60)
GFR calc non Af Amer: >60 mL/min (ref >60)
Glucose, Bld: 303 mg/dL — ABNORMAL HIGH (ref 70–99)
Potassium: 3.6 mmol/L (ref 3.5–5.1)
Sodium: 136 mmol/L (ref 135–145)
Total Bilirubin: 0.3 mg/dL (ref 0.3–1.2)
Total Protein: 5.5 g/dL — ABNORMAL LOW (ref 6.5–8.1)

## 2018-01-02 LAB — GLUCOSE, CAPILLARY
Glucose-Capillary: 329 mg/dL — ABNORMAL HIGH (ref 70–99)
Glucose-Capillary: 289 mg/dL — ABNORMAL HIGH (ref 70–99)
Glucose-Capillary: 381 mg/dL — ABNORMAL HIGH (ref 70–99)
Glucose-Capillary: 205 mg/dL — ABNORMAL HIGH (ref 70–99)

## 2018-01-02 LAB — CBC
HCT: 27.4 % — ABNORMAL LOW (ref 39.0–52.0)
Hemoglobin: 8.5 g/dL — ABNORMAL LOW (ref 13.0–17.0)
MCH: 25.6 pg — ABNORMAL LOW (ref 26.0–34.0)
MCHC: 31 g/dL (ref 30.0–36.0)
MCV: 82.5 fL (ref 80.0–100.0)
Platelets: 409 10*3/uL — ABNORMAL HIGH (ref 150–400)
RBC: 3.32 MIL/uL — AB (ref 4.22–5.81)
RDW: 13.6 % (ref 11.5–15.5)
WBC: 6 10*3/uL (ref 4.0–10.5)
nRBC: 0 % (ref 0.0–0.2)

## 2018-01-02 MED ORDER — AMLODIPINE BESYLATE 5 MG PO TABS
5.0000 mg | ORAL_TABLET | Freq: Every day | ORAL | Status: DC
  Administered 2018-01-02 – 2018-01-07 (×6): 5 mg via ORAL
  Filled 2018-01-02 (×6): qty 1

## 2018-01-02 MED ORDER — INSULIN ASPART PROT & ASPART (70-30 MIX) 100 UNIT/ML ~~LOC~~ SUSP
15.0000 [IU] | Freq: Two times a day (BID) | SUBCUTANEOUS | Status: DC
  Administered 2018-01-02 – 2018-01-04 (×5): 15 [IU] via SUBCUTANEOUS

## 2018-01-02 MED ORDER — INSULIN ASPART PROT & ASPART (70-30 MIX) 100 UNIT/ML ~~LOC~~ SUSP
5.0000 [IU] | Freq: Two times a day (BID) | SUBCUTANEOUS | Status: DC
  Filled 2018-01-02: qty 10

## 2018-01-02 MED ORDER — HYDRALAZINE HCL 20 MG/ML IJ SOLN
5.0000 mg | Freq: Once | INTRAMUSCULAR | Status: AC
  Administered 2018-01-02: 5 mg via INTRAVENOUS
  Filled 2018-01-02: qty 1

## 2018-01-02 MED ORDER — HYDRALAZINE HCL 20 MG/ML IJ SOLN
INTRAMUSCULAR | Status: AC
  Filled 2018-01-02: qty 1

## 2018-01-02 NOTE — Evaluation (Addendum)
Physical Therapy Evaluation Patient Details Name: Mike [name] MRN: [id number] DOB: Dec 08, 1960 Today's Date: 01/02/2018   History of Present Illness  57 yo male admitted with uncontrolled DM. Hx of medical noncompliance, depression, anxiety, Sz, CKD, DM. Pt is homeless.   Clinical Impression  On eval, pt was mostly Supervision to Min guard assist for mobility. He walked ~250 feet around the unit. He did have one LOB towards the end of the session. Discussed d/c plan-pt stated he is not planning to go to a shelter. He may need a CM or SW consult. Will continue to follow and progress activity. Recommend daily ambulation with nursing supervision. He could potentially benefit from use of a straight cane.     Follow Up Recommendations No PT follow up    Equipment Recommendations  could potentially benefit from use of a cane    Recommendations for Other Services       Precautions / Restrictions Precautions Precautions: Fall Restrictions Weight Bearing Restrictions: No      Mobility  Bed Mobility Overal bed mobility: Modified Independent                Transfers Overall transfer level: Needs assistance   Transfers: Sit to/from Stand Sit to Stand: Supervision         General transfer comment: for safety  Ambulation/Gait Ambulation/Gait assistance: Min guard Gait Distance (Feet): 250 Feet Assistive device: None;IV Pole Gait Pattern/deviations: Step-through pattern;Decreased stride length     General Gait Details: Close guard for safety. Pt was able to walk with and without the support of the IV pole. Mildly unsteady at times. He tolerated the distance well.   Stairs            Wheelchair Mobility    Modified Rankin (Stroke Patients Only)       Balance Overall balance assessment: Needs assistance           Standing balance-Leahy Scale: Fair Standing balance comment: Had pt perform static standing: EO/EC; narrow BOS, withstanding of  perturbations, 360 degree turns, picking up an object-Min guard assist. After balance testing, pt had a LOB x 1 as he was walking back to his bed.              High level balance activites: Direction changes;Turns               Pertinent Vitals/Pain Pain Assessment: No/denies pain    Home Living Family/patient expects to be discharged to:: Shelter/Homeless                 Additional Comments: Pt reports he will not be going to a shelter at d/c, and is unsure of where he will go.     Prior Function Level of Independence: Independent         Comments: does not use AD     Hand Dominance        Extremity/Trunk Assessment   Upper Extremity Assessment Upper Extremity Assessment: Overall WFL for tasks assessed    Lower Extremity Assessment Lower Extremity Assessment: Generalized weakness    Cervical / Trunk Assessment Cervical / Trunk Assessment: Normal  Communication   Communication: No difficulties  Cognition Arousal/Alertness: Awake/alert Behavior During Therapy: WFL for tasks assessed/performed Overall Cognitive Status: Within Functional Limits for tasks assessed  General Comments      Exercises     Assessment/Plan    PT Assessment Patient needs continued PT services  PT Problem List Decreased balance;Decreased knowledge of use of DME       PT Treatment Interventions Gait training;Functional mobility training;Balance training;Patient/family education;Therapeutic activities;Therapeutic exercise    PT Goals (Current goals can be found in the Care Plan section)  Acute Rehab PT Goals Patient Stated Goal: none stated PT Goal Formulation: With patient Time For Goal Achievement: 01/16/18 Potential to Achieve Goals: Good    Frequency Min 3X/week   Barriers to discharge        Co-evaluation               AM-PAC PT "6 Clicks" Mobility  Outcome Measure Help needed turning from  your back to your side while in a flat bed without using bedrails?: None Help needed moving from lying on your back to sitting on the side of a flat bed without using bedrails?: None Help needed moving to and from a bed to a chair (including a wheelchair)?: None Help needed standing up from a chair using your arms (e.g., wheelchair or bedside chair)?: None Help needed to walk in hospital room?: A Little Help needed climbing 3-5 steps with a railing? : A Little 6 Click Score: 22    End of Session Equipment Utilized During Treatment: Gait belt Activity Tolerance: Patient tolerated treatment well Patient left: in bed;with call bell/phone within reach;with bed alarm set   PT Visit Diagnosis: Unsteadiness on feet (R26.81)    Time: 1610-9604 PT Time Calculation (min) (ACUTE ONLY): 23 min   Charges:   PT Evaluation $PT Eval Moderate Complexity: 1 Mod PT Treatments $Gait Training: 8-22 mins          Rebeca Alert, PT Acute Rehabilitation Services Pager: [phone] Office: [phone]

## 2018-01-02 NOTE — Progress Notes (Signed)
**Note De-Identified vi Obfusction** PROGRESS NOTE    Mike [name]  [id number] DOB: 26-Apr-1960 DOA: 01/01/2018 PCP: Lvini ShrpsPlcey, Mry Ann, NP   Brief Nrrtive:  Per HPI Mike [name] is  57 y.o. mle with medicl history significnt of dibetes, hypertension, hyperlipidemi, Accin noncomplince, homelessness, nxiety disorder who ws brought in by EMS due to dizziness nd hypoglycemi.  Ptient ws found to hve blood glucose of more thn 1000.  His blood pressure ws 216/95.  Ptient ws slightly ltered in his mentl sttus.  He ws fully wke however in terms of response.  He reported tking his mediction becuse he ws out.  He is got his insulin but storge is  problem been displced.  Ptient ws given IV fluids initil evlution nd is being dmitted to the hospitl with significnt hypoxemi.  No obvious nion gp nd no significnt ketonuri.  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   Blood sugr improved, but still significntly elevted Pt pprently follows with Lvini ShrpsMry Ann Plcey nd [facility], but complince is poor He's on 70/30, but if only ble to [facility] these [facility]  long cting insulin like lntus my be best  Increse 70/30 to 15 units BID  Cre mngement, socil worker consulted  Will need to continue to discuss with [facility] Lvini Shrps(Mry Ann Plcey) regrding follow up nd cre pln.  #2 tobcco buse: Counseling provided.  Ptient ws given nicotine ptch.  #3 hypertension: still significntly elevted Continue lisinopril nd metoprolol Add mlodipine  #4 hyperlipidemi: Resume sttin from home.  #5 homelessness: Event orgniserocil worker  consulttion.  #6 mediction noncomplince: Lrgely due to homelessness.  # Anemi: follow iron pnel, b12, folte, ferritin.  Decresed over pst dy likely 2/2 IVF.  Continue to follow closely.  DVT prophylxis: lovenox Code Sttus: full  Fmily Communiction: none t bedside Disposition Pln: pending sfe dischrge pln  Consultnts:   none  Procedures:   none  Antimicrobils:  Anti-infectives (From dmission, onwrd)   None     Subjective: No complints. Sys [facility]  Objective: Vitls:   01/02/18 1000 01/02/18 1100 01/02/18 1200 01/02/18 1300  BP: (!) 145/72 (!) 155/79 (!) 158/75 (!) 178/78  Pulse:  (!) 48 70 72  Resp: 17 17 16 16   Temp:   98.2 F (36.8 C)   TempSrc:   Orl   SpO2:  100% 100% 100%    Intke/Output Summry (Lst 24 hours) t 01/02/2018 1446 Lst dt filed t 01/02/2018 1326 Gross per 24 hour  Intke 2729.79 ml  Output 775 ml  Net 1954.79 ml   There were no vitls filed for this visit.  Exmintion:  Generl exm: Appers clm nd comfortble  Respirtory system: Cler to usculttion. Respirtory effort norml. Crdiovsculr system: S1 & S2 herd, RRR.  Gstrointestinl system: Abdomen is nondistended, soft nd nontender Centrl nervous system: Alert. No focl neurologicl deficits. Extremities: no LEE Skin: No rshes, lesions or ulcers Psychitry: Judgement nd insight pper norml. Mood & ffect flt.     Dt Reviewed: I hve personlly reviewed following lbs nd imging studies  CBC: Recent Lbs  Lb 12/31/17 1430 01/01/18 0149 01/01/18 0319 01/02/18 0246  WBC  --   -- 7.8 6.0  NEUTROABS  --   --  6.3  --   HGB 12.6* 13.3 9.6* 8.5*  HCT 37.0* 39.0 31.9* 27.4*  MCV  --   --  83.9 82.5  PLT  --   --  453* 409*   Basic Metabolic Panel: Recent Labs  Lab 12/31/17 1430 01/01/18 0149 01/01/18 0319 01/02/18 0246  NA 133* 125* 130* 136  K 4.4 5.3* 4.9 3.6  CL 98 91* 90* 106  CO2  --   --   26 22  GLUCOSE 522* >700* 1,047* 303*  BUN 18 29* 30* 20  CREATININE 0.80 1.40* 1.53* 0.72  CALCIUM  --   --  9.1 8.2*   GFR: Estimated Creatinine Clearance: 112.4 mL/min (by C-G formula based on SCr of 0.72 mg/dL). Liver Function Tests: Recent Labs  Lab 01/02/18 0246  AST 21  ALT 20  ALKPHOS 76  BILITOT 0.3  PROT 5.5*  ALBUMIN 2.8*   No results for input(s): LIPASE, AMYLASE in the last 168 hours. No results for input(s): AMMONIA in the last 168 hours. Coagulation Profile: No results for input(s): INR, PROTIME in the last 168 hours. Cardiac Enzymes: No results for input(s): CKTOTAL, CKMB, CKMBINDEX, TROPONINI in the last 168 hours. BNP (last 3 results) No results for input(s): PROBNP in the last 8760 hours. HbA1C: No results for input(s): HGBA1C in the last 72 hours. CBG: Recent Labs  Lab 01/01/18 1452 01/01/18 1652 01/01/18 2138 01/02/18 0737 01/02/18 1129  GLUCAP 204* 210* 102* 329* 289*   Lipid Profile: No results for input(s): CHOL, HDL, LDLCALC, TRIG, CHOLHDL, LDLDIRECT in the last 72 hours. Thyroid Function Tests: No results for input(s): TSH, T4TOTAL, FREET4, T3FREE, THYROIDAB in the last 72 hours. Anemia Panel: No results for input(s): VITAMINB12, FOLATE, FERRITIN, TIBC, IRON, RETICCTPCT in the last 72 hours. Sepsis Labs: No results for input(s): PROCALCITON, LATICACIDVEN in the last 168 hours.  No results found for this or any previous visit (from the past 240 hour(s)).       Radiology Studies: No results found.      Scheduled Meds: . atorvastatin  40 mg Oral Daily  . enoxaparin (LOVENOX) injection  40 mg Subcutaneous Q24H  . insulin aspart  0-20 Units Subcutaneous TID WC  . insulin aspart  0-5 Units Subcutaneous QHS  . insulin aspart protamine- aspart  5 Units Subcutaneous BID WC  . lisinopril  20 mg Oral Daily  . metoprolol tartrate  12.5 mg Oral BID  . multivitamin with minerals  1 tablet Oral Daily   Continuous Infusions: . sodium  chloride 125 mL/hr at 01/02/18 1300     LOS: 1 day    Time spent: over 30 min    Lacretia Nicks, MD [facility] Pager [phone]  If 7PM-7AM, please contact night-coverage [email or web address] Password [email or web address] 01/02/2018, 2:46 PM

## 2018-01-02 NOTE — Care Management Note (Signed)
Case Management Note  Patient Details  Name: Mike [name] MRN: [id number] Date of Birth: [birth year]  Subjective/Objective:                   Discharge readiness is indicated by patient meeting Recovery Milestones, including ALL of the following: ? Hemodynamic stability-stab le vital signs stable. ? No precipitating cause identified, or cause manageable in outpatient setting/compliance issues ? Acidosis absent  Anion gap =8 ? Mental status at baseline alert ? Blood glucose under acceptable control ? Glucose level=329 ? Electrolyte abnormalities absent or acceptable for next level of care ? yes ? Renal function at baseline or acceptable for next level of care ? Bun=20/creatine=0.72 ? Dehydration absent no dehydration ? Outpatient diabetic medication regimen established  Not at this time ? Ambulatory -yes ? Oral hydration, medications, and diet/iv ns at 125cc/hr, D5w 1/2 [place] at 100cc/hr   Action/Plan: Not ready for discharge   Expected Discharge Date:  (unknown)               Expected Discharge Plan:  Home/Self Care  In-House Referral:     Discharge planning Services  CM Consult  Post Acute Care Choice:    Choice offered to:     DME Arranged:    DME Agency:     HH Arranged:    HH Agency:     Status of Service:  In process, will continue to follow  If discussed at Long Length of Stay Meetings, dates discussed:    Additional Comments:  [name], Alayza Pieper Lynn, RN 01/02/2018, 8:44 AM

## 2018-01-02 NOTE — Progress Notes (Signed)
Inpatient Diabetes Program Recommendations  AACE/ADA: New Consensus Statement on Inpatient Glycemic Control (2015)  Target Ranges:  Prepandial:   less than 140 mg/dL      Peak postprandial:   less than 180 mg/dL (1-2 hours)      Critically ill patients:  140 - 180 mg/dL   Lab Results  Component Value Date   GLUCAP 289 (H) 01/02/2018   HGBA1C 10.7 (H) 12/25/2017    Review of Glycemic Control  Diabetes history: DM2 Outpatient Diabetes medications: 70/30 10 units QAM Current orders for Inpatient glycemic control: 70/30 5 units bid, Novolog 0-20 units tidwc and hs  [facility]. He does not give himself insulin. Pt appears to be unable to take care of himself.   Inpatient Diabetes Program Recommendations:     Agree with orders.  Pt has had 20 ED visits in past 3 months. Needs case management and social work consult to discuss options at discharge. Pt is homeless and cannot take care of himself and his diabetes when discharged.   Will follow closely. Discussed with RN.  Thank you. Ailene Ardshonda Dequandre Cordova, RD, LDN, CDE Inpatient Diabetes Coordinator [phone]

## 2018-01-03 LAB — CBC
HCT: 29.2 % — ABNORMAL LOW (ref 39.0–52.0)
Hemoglobin: 8.9 g/dL — ABNORMAL LOW (ref 13.0–17.0)
MCH: 25.4 pg — ABNORMAL LOW (ref 26.0–34.0)
MCHC: 30.5 g/dL (ref 30.0–36.0)
MCV: 83.2 fL (ref 80.0–100.0)
Platelets: 429 10*3/uL — ABNORMAL HIGH (ref 150–400)
RBC: 3.51 MIL/uL — ABNORMAL LOW (ref 4.22–5.81)
RDW: 13.9 % (ref 11.5–15.5)
WBC: 7.5 10*3/uL (ref 4.0–10.5)
nRBC: 0 % (ref 0.0–0.2)

## 2018-01-03 LAB — GLUCOSE, CAPILLARY
Glucose-Capillary: 286 mg/dL — ABNORMAL HIGH (ref 70–99)
Glucose-Capillary: 301 mg/dL — ABNORMAL HIGH (ref 70–99)
Glucose-Capillary: 157 mg/dL — ABNORMAL HIGH (ref 70–99)
Glucose-Capillary: 283 mg/dL — ABNORMAL HIGH (ref 70–99)

## 2018-01-03 LAB — COMPREHENSIVE METABOLIC PANEL
ALT: 20 U/L (ref 0–44)
AST: 17 U/L (ref 15–41)
Albumin: 2.7 g/dL — ABNORMAL LOW (ref 3.5–5.0)
Alkaline Phosphatase: 75 U/L (ref 38–126)
Anion gap: 8 (ref 5–15)
BUN: 12 mg/dL (ref 6–20)
CO2: 22 mmol/L (ref 22–32)
CREATININE: 0.62 mg/dL (ref 0.61–1.24)
Calcium: 8.4 mg/dL — ABNORMAL LOW (ref 8.9–10.3)
Chloride: 107 mmol/L (ref 98–111)
GFR calc Af Amer: >60 mL/min (ref >60)
GFR calc non Af Amer: >60 mL/min (ref >60)
Glucose, Bld: 183 mg/dL — ABNORMAL HIGH (ref 70–99)
Potassium: 3.8 mmol/L (ref 3.5–5.1)
Sodium: 137 mmol/L (ref 135–145)
Total Bilirubin: 0.2 mg/dL — ABNORMAL LOW (ref 0.3–1.2)
Total Protein: 5.6 g/dL — ABNORMAL LOW (ref 6.5–8.1)

## 2018-01-03 LAB — FOLATE: Folate: 13 ng/mL (ref >5.9)

## 2018-01-03 LAB — IRON AND TIBC
Iron: 51 ug/dL (ref 45–182)
Saturation Ratios: 23 % (ref 17.9–39.5)
TIBC: 223 ug/dL — ABNORMAL LOW (ref 250–450)
UIBC: 172 ug/dL

## 2018-01-03 LAB — MAGNESIUM: Magnesium: 1.9 mg/dL (ref 1.7–2.4)

## 2018-01-03 LAB — VITAMIN B12: Vitamin B-12: 757 pg/mL (ref 180–914)

## 2018-01-03 LAB — FERRITIN: Ferritin: 46 ng/mL (ref 24–336)

## 2018-01-03 MED ORDER — HYDRALAZINE HCL 20 MG/ML IJ SOLN
5.0000 mg | Freq: Once | INTRAMUSCULAR | Status: AC
  Administered 2018-01-03: 5 mg via INTRAVENOUS
  Filled 2018-01-03: qty 1

## 2018-01-03 NOTE — Progress Notes (Signed)
**Note De-Identified vi Obfusction** PROGRESS NOTE    Mike [name]  [id number] DOB: 1960-08-24 DOA: 01/01/2018 PCP: Mike ShrpsPlcey, Mry Ann, NP   Brief Nrrtive:  Per HPI Mike [name] is  57 y.o. mle with medicl history significnt of dibetes, hypertension, hyperlipidemi, Accin noncomplince, homelessness, nxiety disorder who ws brought in by EMS due to dizziness nd hypoglycemi.  Ptient ws found to hve blood glucose of more thn 1000.  His blood pressure ws 216/95.  Ptient ws slightly ltered in his mentl sttus.  He ws fully wke however in terms of response.  He reported tking his mediction becuse he ws out.  He is got his insulin but storge is  problem been displced.  Ptient ws given IV fluids initil evlution nd is being dmitted to the hospitl with significnt hypoxemi.  No obvious nion gp nd no significnt ketonuri.  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  Mike [name] [facility] [place].  He tells me tody tht he does not know how much insulin tht he tkes nd tht he does not give himself insulin.  When I sked him how he gets his insulin everydy, he sid most of the time he hs to come here.  I discussed the cse with Mike ShrpsMry nn Plcey t [facility] who sttes tht she sees Mike [name] infrequently for his insulin injections, mybe once every few weeks.  She mentioned sometimes, there is some concern tht he sbotges his cre (by eting sweets), but it is lso cler tht he is unble to cre for himself.  Unfortuntely, even if [facility] during the week, he would not be ble to get insulin on the weekends (except 1  Sturdy  month).  He hs hd numerous visits to the hospitl, to either the emergency deprtment or dmissions over the pst few weeks nd months.  He hs not demonstrted the bility to cre for himself independently.  Notbly he ws seen by psych in July of this yer nd determined not to hve the cpcity to refuse SNF plcement t tht point in time.  I discussed with socil work tody to see if we could rech  fmily member.  Apprently [place].  He [place] me [place]  Sitution is complicted s he is pprently undocumented.  At this point in time, will try to rech fmily.  No sfe dischrge t this point in time.     #1 hyperosmolr nonketotic hyperglycemi:  Ptient will be dmitted to the hospitl.   Blood sugr improved, but still significntly elevted Pt pprently follows with Chles AbrhmsMry [name] Plcey nd [facility], but complince is poor He's on 70/30, but if only ble to give these [facility]  long cting insulin like lntus my be best  Continue 70/30 t 15 units BID  Cre mngement, socil worker consulted  Will need to continue to discuss with [facility] Mike Shrps(Mry [name] Plcey) regrding follow up nd cre pln.  #2 tobcco buse: Counseling provided.  Ptient ws given nicotine ptch.  #3 hypertension: improving Lisinopril, metop, mlodipine  #4 hyperlipidemi: Resume sttin from **Note De-Identified vi Obfusction** home.  #5 homelessness: dministrtor, [employer].  #6 mediction noncomplince: relted to homelessness s well s likely cognitive impirment  # nemi:  Lbs suggestive of OCD.  Continue to follow closely.  DVT prophylxis: lovenox Code Sttus: full  Fmily Communiction: none t bedside Disposition Pln: pending sfe dischrge pln  Consultnts:   none  Procedures:   none  ntimicrobils:  nti-infectives (From dmission, onwrd)   None     Subjective: &Ox3 tody. Doesn't know how much insulin he tkes.  Sys  most of the time, he hs to come here for his insulin.  Objective: Vitls:   01/03/18 0800 01/03/18 1207 01/03/18 1600 01/03/18 1745  BP: (!) 165/78 (!) 175/87  (!) 155/68  Pulse: 70     Resp: 14     Temp: 98.6 F (37 C) 98.5 F (36.9 C) 98.6 F (37 C)   TempSrc: Orl Orl Orl   SpO2: 100%       Intke/Output Summry (Lst 24 hours) t 01/03/2018 1916 Lst dt filed t 01/03/2018 1800 Gross per 24 hour  Intke 2951.86 ml  Output 2125 ml  Net 826.86 ml   There were no vitls filed for this visit.  Exmintion:  Generl: No cute distress. Crdiovsculr: Hert sounds show  regulr rte, nd rhythm.  Lungs: Cler to usculttion bilterlly  bdomen: Soft, nontender, nondistended Neurologicl: lert nd oriented 3. Moves ll extremities 4. Crnil nerves II through XII grossly intct. Skin: Wrm nd dry. No rshes or lesions. Extremities: No clubbing or cynosis. No edem.   Dt Reviewed: I hve personlly reviewed following lbs nd imging studies  CBC: Recent Lbs  Lb 12/31/17 1430 01/01/18 0149 01/01/18 0319 01/02/18 0246 01/03/18 0256  WBC  --   --  7.8 6.0 7.5  NEUTROBS  --   --  6.3  --   --   HGB 12.6* 13.3 9.6* 8.5* 8.9*  HCT 37.0* 39.0 31.9* 27.4* 29.2*  MCV  --   --  83.9 82.5 83.2  PLT  --   --  453* 409* 429*   Bsic Metbolic Pnel: Recent Lbs  Lb 12/31/17 1430 01/01/18 0149 01/01/18 0319 01/02/18 0246 01/03/18 0256  N 133* 125* 130* 136 137  K 4.4 5.3* 4.9 3.6 3.8  CL 98 91* 90* 106 107  CO2  --   --  26 22 22   GLUCOSE 522* >700* 1,047* 303* 183*  BUN 18 29* 30* 20 12  CRETININE 0.80 1.40* 1.53* 0.72 0.62  CLCIUM  --   --  9.1 8.2* 8.4*  MG  --   --   --   --  1.9   GFR: Estimted Cretinine Clernce: 112.4 mL/min (by C-G formul bsed on SCr of 0.62 mg/dL). Liver Function Tests: Recent Lbs  Lb 01/02/18 0246 01/03/18 0256  ST 21 17  LT 20 20  LKPHOS 76 75  BILITOT 0.3 0.2*  PROT 5.5* 5.6*  LBUMIN 2.8*  2.7*   No results for input(s): LIPSE, MYLSE in the lst 168 hours. No results for input(s): MMONI in the lst 168 hours. Cogultion Profile: No results for input(s): INR, PROTIME in the lst 168 hours. Crdic Enzymes: No results for input(s): CKTOTL, CKMB, CKMBINDEX, TROPONINI in the lst 168 hours. BNP (lst 3 results) No results for input(s): PROBNP in the lst 8760 hours. Hb1C: No results for input(s): HGB1C in the lst 72 hours. CBG: Recent Lbs  Lb 01/02/18 1642 01/02/18 2137 01/03/18 0759 01/03/18 1216 01/03/18 1621  GLUCP 381* 205* 286* 301* 157*   Lipid Profile: No results for input(s): CHOL, HDL, LDLCALC, TRIG, CHOLHDL, LDLDIRECT in the last 72 hours. Thyroid Function Tests: No results for input(s): TSH, T4TOTAL, FREET4, T3FREE, THYROIDAB in the last 72 hours. Anemia Panel: Recent Labs    01/03/18 0256  VITAMINB12 757  FOLATE 13.0  FERRITIN 46  TIBC 223*  IRON 51   Sepsis Labs: No results for input(s): PROCALCITON, LATICACIDVEN in the last 168 hours.  No results found for this or any previous visit (from the past 240 hour(s)).       Radiology Studies: No results found.      Scheduled Meds: . amLODipine  5 mg Oral Daily  . atorvastatin  40 mg Oral Daily  . enoxaparin (LOVENOX) injection  40 mg Subcutaneous Q24H  . insulin aspart  0-20 Units Subcutaneous TID WC  . insulin aspart  0-5 Units Subcutaneous QHS  . insulin aspart protamine- aspart  15 Units Subcutaneous BID WC  . lisinopril  20 mg Oral Daily  . metoprolol tartrate  12.5 mg Oral BID  . multivitamin with minerals  1 tablet Oral Daily   Continuous Infusions: . sodium chloride 125 mL/hr at 01/03/18 1800     LOS: 2 days    Time spent: over 30 min    Lacretia Nicksaldwell Powell, MD [facility] Pager [phone]  If 7PM-7AM, please contact night-coverage [email or web address] Password [email or web address] 01/03/2018, 7:16 PM

## 2018-01-04 LAB — COMPREHENSIVE METABOLIC PANEL
ALT: 18 U/L (ref 0–44)
AST: 19 U/L (ref 15–41)
Albumin: 2.6 g/dL — ABNORMAL LOW (ref 3.5–5.0)
Alkaline Phosphatase: 77 U/L (ref 38–126)
Anion gap: 5 (ref 5–15)
BUN: 12 mg/dL (ref 6–20)
CALCIUM: 8.1 mg/dL — AB (ref 8.9–10.3)
CO2: 24 mmol/L (ref 22–32)
Chloride: 106 mmol/L (ref 98–111)
Creatinine, Ser: 0.64 mg/dL (ref 0.61–1.24)
GFR calc Af Amer: >60 mL/min (ref >60)
GFR calc non Af Amer: >60 mL/min (ref >60)
Glucose, Bld: 220 mg/dL — ABNORMAL HIGH (ref 70–99)
Potassium: 3.7 mmol/L (ref 3.5–5.1)
Sodium: 135 mmol/L (ref 135–145)
Total Bilirubin: 0.6 mg/dL (ref 0.3–1.2)
Total Protein: 5.3 g/dL — ABNORMAL LOW (ref 6.5–8.1)

## 2018-01-04 LAB — CBC
HCT: 27.5 % — ABNORMAL LOW (ref 39.0–52.0)
HEMOGLOBIN: 8.2 g/dL — AB (ref 13.0–17.0)
MCH: 24.7 pg — ABNORMAL LOW (ref 26.0–34.0)
MCHC: 29.8 g/dL — ABNORMAL LOW (ref 30.0–36.0)
MCV: 82.8 fL (ref 80.0–100.0)
Platelets: 365 10*3/uL (ref 150–400)
RBC: 3.32 MIL/uL — AB (ref 4.22–5.81)
RDW: 14.1 % (ref 11.5–15.5)
WBC: 5.8 10*3/uL (ref 4.0–10.5)
nRBC: 0 % (ref 0.0–0.2)

## 2018-01-04 LAB — GLUCOSE, CAPILLARY
Glucose-Capillary: 338 mg/dL — ABNORMAL HIGH (ref 70–99)
Glucose-Capillary: 343 mg/dL — ABNORMAL HIGH (ref 70–99)
Glucose-Capillary: 204 mg/dL — ABNORMAL HIGH (ref 70–99)
Glucose-Capillary: 175 mg/dL — ABNORMAL HIGH (ref 70–99)

## 2018-01-04 LAB — MAGNESIUM: Magnesium: 2 mg/dL (ref 1.7–2.4)

## 2018-01-04 MED ORDER — PROSIGHT PO TABS
1.0000 | ORAL_TABLET | Freq: Every day | ORAL | Status: DC
  Administered 2018-01-04 – 2018-01-11 (×8): 1 via ORAL
  Filled 2018-01-04 (×8): qty 1

## 2018-01-04 MED ORDER — OCUVITE-LUTEIN PO CAPS: 1.0000 | ORAL_CAPSULE | Freq: Every day | ORAL | Status: DC

## 2018-01-04 MED ORDER — INSULIN ASPART PROT & ASPART (70-30 MIX) 100 UNIT/ML ~~LOC~~ SUSP
20.0000 [IU] | Freq: Two times a day (BID) | SUBCUTANEOUS | Status: DC
  Administered 2018-01-05: 20 [IU] via SUBCUTANEOUS
  Filled 2018-01-04: qty 10

## 2018-01-04 MED ORDER — PRO-STAT SUGAR FREE PO LIQD
30.0000 mL | Freq: Two times a day (BID) | ORAL | Status: DC
  Administered 2018-01-04 – 2018-01-11 (×14): 30 mL via ORAL
  Filled 2018-01-04 (×14): qty 30

## 2018-01-04 MED ORDER — GLUCERNA SHAKE PO LIQD
237.0000 mL | Freq: Three times a day (TID) | ORAL | Status: DC
  Administered 2018-01-04 – 2018-01-10 (×17): 237 mL via ORAL
  Filled 2018-01-04 (×24): qty 237

## 2018-01-04 NOTE — Progress Notes (Signed)
PROGRESS NOTE    Mike [name]  ZOX:[id number] DOB: Jul 29, 1960 DOA: 01/01/2018 PCP: [name], [name], NP   Brief Narrative:  Per HPI Mike [name] is Mike [name] 57 y.o. male with medical history significant of diabetes, hypertension, hyperlipidemia, [place] noncompliance, homelessness, anxiety disorder who was brought in by EMS due to dizziness and hypoglycemia.  Patient was found to have blood glucose of more than 1000.  His blood pressure was 216/95.  Patient was slightly altered in his mental status.  He was fully awake however in terms of response.  He reported taking his medication because he was out.  He is got his insulin but storage is [place] problem been displaced.  Patient was given IV fluids initial evaluation and is being admitted to the hospital with significant hypoxemia.  No obvious anion gap and no significant ketonuria.  Assessment & Plan:   Principal Problem:   Uncontrolled type 2 diabetes mellitus with hyperosmolar nonketotic hyperglycemia (HCC) Active Problems:   Hypertensive urgency   Tobacco abuse   Noncompliance   Hyperglycemia due to type 2 diabetes mellitus (HCC)   Pressure injury of skin  # Difficult case, especially when considering Mike [name] safe discharge plan.  Mike [name] [facility] [place].  He tells me today that he does not know how much insulin that he takes and that he does not give himself insulin.  When I asked him how he gets his insulin everyday, he said most of the time he has to come here.  I discussed the case with [name] who states that she sees Mike [name] infrequently for his insulin injections, maybe once every few weeks.  She mentioned sometimes, there is some concern that he sabotages his care (by eating sweets), but it is also clear that he is unable to care for himself.  Unfortunately, even if [facility] were [facility] during the week, he would not be able to get insulin on the weekends (except 1  Saturday Laurent [place]arg[place] month).  He has had numerous visits to the hospital, to either the emergency department or admissions over the past few weeks and months.  He has not demonstrated the ability to care for himself independently.  Notably he was seen by psych in July of this year and determined not to have the capacity to refuse SNF placement at that point in time.  I discussed with social work (12/19) to see if we could reach [place] Korver family member.  Apparently he has Mike Henricksen son in [place].  He tells me the rest of his family is in [place].  Situation is complicated as he is apparently undocumented.  At this point in time, will try to reach family (appreciate social work assistance with this - phone number for ex wife found in chart, will follow up).  No safe discharge at this point in time.     #1 hyperosmolar nonketotic hyperglycemia:  Patient will be admitted to the hospital.   Blood sugar improved, but still significantly elevated Pt apparently follows with Chales AbrahamsMary Ann [name] and [facility], but compliance is poor He's on 70/30, but if only able to give these once daily at [facility] long acting insulin like lantus may be best  Increase 70/30 to 20 units BID  Care management, social worker consulted  Will need to continue to discuss with [facility] [name]([name]) regarding follow up and care plan.  #2 tobacco abuse: Counseling provided.  Patient was given nicotine patch.  #3 hypertension: Improved, but still elevated, follow Lisinopril, metop, amlodipine  #4 hyperlipidemia: Resume statin from home.  #5 homelessness: Administrator, Civil Serviceocial worker consultation.  #6 medication noncompliance: related to homelessness as well as likely cognitive impairment  # Anemia:  Labs suggestive of AOCD.  Continue to follow closely.  Fluctuating.   DVT prophylaxis: lovenox Code Status: full  Family Communication: none at bedside Disposition Plan: pending safe discharge plan  Consultants:   none  Procedures:    none  Antimicrobials:  Anti-infectives (From admission, onward)   None     Subjective: Asking for graham crackers today. Denies other complaints.  Objective: Vitals:   01/04/18 0748 01/04/18 0800 01/04/18 1148 01/04/18 1200  BP: (!) 183/84  (!) 149/86   Pulse:      Resp:      Temp:  98.1 F (36.7 C)  98.1 F (36.7 C)  TempSrc:  Oral  Oral  SpO2: 100%       Intake/Output Summary (Last 24 hours) at 01/04/2018 1804 Last data filed at 01/04/2018 1100 Gross per 24 hour  Intake 1410.39 ml  Output 2640 ml  Net -1229.61 ml   There were no vitals filed for this visit.  Examination:  General: No acute distress. Cardiovascular: Heart sounds show Mike [name] regular rate, and rhythm. Lungs: Clear to auscultation bilaterally  Abdomen: Soft, nontender, nondistended  Neurological: Alert and oriented 3. Moves all extremities 4. Cranial nerves II through XII grossly intact. Skin: Warm and dry. No rashes or lesions. Extremities: No clubbing or cyanosis. No edema.   Data Reviewed: I have personally reviewed following labs and imaging studies  CBC: Recent Labs  Lab 01/01/18 0149 01/01/18 0319 01/02/18 0246 01/03/18 0256 01/04/18 0237  WBC  --  7.8 6.0 7.5 5.8  NEUTROABS  --  6.3  --   --   --   HGB 13.3 9.6* 8.5* 8.9* 8.2*  HCT 39.0 31.9* 27.4* 29.2* 27.5*  MCV  --  83.9 82.5 83.2 82.8  PLT  --  453* 409* 429* 365   Basic Metabolic Panel: Recent Labs  Lab 01/01/18 0149 01/01/18 0319 01/02/18 0246 01/03/18 0256 01/04/18 0237  NA 125* 130* 136 137 135  K 5.3* 4.9 3.6 3.8 3.7  CL 91* 90* 106 107 106  CO2  --  26 22 22 24   GLUCOSE >700* 1,047* 303* 183* 220*  BUN 29* 30* 20 12 12   CREATININE 1.40* 1.53* 0.72 0.62 0.64  CALCIUM  --  9.1 8.2* 8.4* 8.1*  MG  --   --   --  1.9 2.0   GFR: Estimated Creatinine Clearance: 112.4 mL/min (by C-G formula based on SCr of 0.64 mg/dL). Liver Function Tests: Recent Labs  Lab 01/02/18 0246 01/03/18 0256 01/04/18 0237  AST  21 17 19   ALT 20 20 18   ALKPHOS 76 75 77  BILITOT 0.3 0.2* 0.6  PROT 5.5* 5.6* 5.3*  ALBUMIN 2.8* 2.7* 2.6*   No results for input(s): LIPASE, AMYLASE in the last 168 hours. No results for input(s): AMMONIA in the last 168 hours. Coagulation Profile: No results for input(s): INR, PROTIME in the last 168 hours. Cardiac Enzymes: No results for input(s): CKTOTAL, CKMB, CKMBINDEX, TROPONINI in the last 168 hours. BNP (last 3 results) No results for input(s): PROBNP in the last 8760 hours. HbA1C: No results for input(s): HGBA1C in the last 72 hours. CBG: Recent Labs  Lab 01/03/18 1621 01/03/18 2220 01/04/18 0756 01/04/18 1139 01/04/18 1610  GLUCAP  157* 283* 338* 343* 204*   Lipid Profile: No results for input(s): CHOL, HDL, LDLCALC, TRIG, CHOLHDL, LDLDIRECT in the last 72 hours. Thyroid Function Tests: No results for input(s): TSH, T4TOTAL, FREET4, T3FREE, THYROIDAB in the last 72 hours. Anemia Panel: Recent Labs    01/03/18 0256  VITAMINB12 757  FOLATE 13.0  FERRITIN 46  TIBC 223*  IRON 51   Sepsis Labs: No results for input(s): PROCALCITON, LATICACIDVEN in the last 168 hours.  No results found for this or any previous visit (from the past 240 hour(s)).       Radiology Studies: No results found.      Scheduled Meds: . amLODipine  5 mg Oral Daily  . atorvastatin  40 mg Oral Daily  . enoxaparin (LOVENOX) injection  40 mg Subcutaneous Q24H  . feeding supplement (GLUCERNA SHAKE)  237 mL Oral TID BM  . feeding supplement (PRO-STAT SUGAR FREE 64)  30 mL Oral BID  . insulin aspart  0-20 Units Subcutaneous TID WC  . insulin aspart  0-5 Units Subcutaneous QHS  . [START ON 01/05/2018] insulin aspart protamine- aspart  20 Units Subcutaneous BID WC  . lisinopril  20 mg Oral Daily  . metoprolol tartrate  12.5 mg Oral BID  . multivitamin  1 tablet Oral Daily   Continuous Infusions:    LOS: 3 days    Time spent: over 30 min    Lacretia Nicks, MD [facility] Pager [phone]  If 7PM-7AM, please contact night-coverage [email or web address] Password [facility] 01/04/2018, 6:04 PM

## 2018-01-04 NOTE — Progress Notes (Signed)
Per attending request [name] attempting to find contacts for patient.   Patient reports that he has an adult son, Mike [name] in [place]. Patient does not know how to contact him.   [name] found a number for an ex spouse Mike [name] [phone], patient gives permission to contact Mike [name]. Per patient she may have information for [place].   [name] left message for Mike [name], CSW ChiropodistAssistant Director regarding assistance with patient disposition.   Mike [name], LSCW [facility] CSW [phone]

## 2018-01-04 NOTE — Progress Notes (Signed)
Inpatient Diabetes Program Recommendations  AACE/ADA: New Consensus Statement on Inpatient Glycemic Control (2015)  Target Ranges:  Prepandial:   less than 140 mg/dL      Peak postprandial:   less than 180 mg/dL (1-2 hours)      Critically ill patients:  140 - 180 mg/dL   Lab Results  Component Value Date   GLUCAP 343 (H) 01/04/2018   HGBA1C 10.7 (H) 12/25/2017    Review of Glycemic Control  Blood sugars still elevated in 300s. Needs more insulin. Pt likely eating a lot more food than he usually does when not hospitalized.  Inpatient Diabetes Program Recommendations:     Increase 70/30 to 20 units bid.  Continue to follow.  Thank you. Ailene Ardshonda Iman Orourke, RD, LDN, CDE Inpatient Diabetes Coordinator [phone]

## 2018-01-04 NOTE — Progress Notes (Signed)
Initial Nutrition Assessment  DOCUMENTATION CODES:   Underweight  INTERVENTION:  Protstat BID; each supplement provides 100 kcals and 15 grams of protein Ocuvite daily; each vitamin provides 200mg  vitC, 40mg  zinc, 55mg  selenium, 2mg  lutein Glucerna Shake po TID, each supplement provides 220 kcal and 10 grams of protein    NUTRITION DIAGNOSIS:   Increased nutrient needs related to wound healing(stageII left buttocks) as evidenced by estimated needs.   GOAL:   Patient will meet greater than or equal to 90% of their needs   MONITOR:   PO intake, Labs, I & O's, Supplement acceptance, Weight trends, Skin  REASON FOR ASSESSMENT:   Other (Comment)(Pressure Ulcer)    ASSESSMENT:  57 year old patient with medical history significant of DM, HTN, HLD, homelessness, anxiety brought in by EMS d/t dizziness and hypoglycemia  Social Worker in with patient at time of visit. Per MD note, patient was found to have blood glucose of +1000 and BP of 216/95 by EMS. [facility] [place] and reported to not receive on a regular basis. Patient has numerous hospital stays and has refused SNF placement when offered in July. Patient reports no family close by (a son in [place] and others in [place])  Case manager note reports that a phone number for an x spouse has been located and patient has given permission to contact.   Medications reviewed and include: novoLog (0-20 units) with meals, (0-5 units) at bedtime, novolog mix 70/30 (15 units) BID, MVI  0.9% NaCl @ 18625mL/hr  Labs: Glucose 220 (H) Ca corrects for low albumin 9.2   NUTRITION - FOCUSED PHYSICAL EXAM:    Most Recent Value  Orbital Region  --  (Pended)  [unable to see patient]  Edema (RD Assessment)  Mild [mild swelling,  BLE]       Diet Order:   Diet Order            Diet heart healthy/carb modified Room service appropriate? Yes; Fluid consistency: Thin  Diet effective now              EDUCATION  NEEDS:   No education needs have been identified at this time  Skin:  Skin Assessment: Reviewed RN Assessment(stage II; left buttocks)  Last BM:  01/02/2018; type 4; brown; med  Height:   Ht Readings from Last 1 Encounters:  12/31/17 6\' 2"  (1.88 m)    Weight:   Wt Readings from Last 1 Encounters:  12/31/17 77.1 kg    Ideal Body Weight:  86.4 kg  BMI:  There is no height or weight on file to calculate BMI.  Estimated Nutritional Needs:   Kcal:  2200-2500  Protein:  116-131g (1.5-1.7g/kg)  Fluid:  </=2L/day    Lars MassonSuzanne Simmie Camerer, RD, LDN  After Hours/Weekend Pager: [phone]

## 2018-01-04 NOTE — Progress Notes (Signed)
Physical Therapy Treatment Patient Details Name: Mike [name] MRN: [id number] DOB: 1960/04/28 Today's Date: 01/04/2018    History of Present Illness 57 yo male admitted with uncontrolled DM. Hx of medical noncompliance, depression, anxiety, Sz, CKD, DM. Pt is homeless.     PT Comments    Pt continues to participate well. Recommend continued ambulation with nursing as able.    Follow Up Recommendations  No PT follow up     Equipment Recommendations       Recommendations for Other Services       Precautions / Restrictions Precautions Precautions: Fall Restrictions Weight Bearing Restrictions: No    Mobility  Bed Mobility Overal bed mobility: Modified Independent                Transfers Overall transfer level: Modified independent                  Ambulation/Gait Ambulation/Gait assistance: Min guard Gait Distance (Feet): 500 Feet Assistive device: None;Straight cane Gait Pattern/deviations: Step-through pattern;Drifts right/left;Staggering right;Staggering left     General Gait Details: Close guard for safety. A few instances of unsteadiness. Pt walked 250 feet without device then 250 feet with cane. He used cane to steady PRN.    Stairs             Wheelchair Mobility    Modified Rankin (Stroke Patients Only)       Balance Overall balance assessment: Mild deficits observed, not formally tested           Standing balance-Leahy Scale: Fair                              Cognition Arousal/Alertness: Awake/alert Behavior During Therapy: WFL for tasks assessed/performed Overall Cognitive Status: Within Functional Limits for tasks assessed                                        Exercises      General Comments        Pertinent Vitals/Pain Pain Assessment: No/denies pain    Home Living                      Prior Function            PT Goals (current goals can now be found in  the care plan section) Progress towards PT goals: Progressing toward goals    Frequency    Min 3X/week      PT Plan Current plan remains appropriate    Co-evaluation              AM-PAC PT "6 Clicks" Mobility   Outcome Measure  Help needed turning from your back to your side while in a flat bed without using bedrails?: None Help needed moving from lying on your back to sitting on the side of a flat bed without using bedrails?: None Help needed moving to and from a bed to a chair (including a wheelchair)?: None Help needed standing up from a chair using your arms (e.g., wheelchair or bedside chair)?: None Help needed to walk in hospital room?: A Little Help needed climbing 3-5 steps with a railing? : A Little 6 Click Score: 22    End of Session Equipment Utilized During Treatment: Gait belt Activity Tolerance: Patient tolerated treatment well Patient left: in chair;with call bell/phone within  reach   PT Visit Diagnosis: Unsteadiness on feet (R26.81)     Time: [id number] PT Time Calculation (min) (ACUTE ONLY): 14 min  Charges:  $Gait Training: 8-22 mins                        Rebeca AlertJannie Janard Culp, PT Acute Rehabilitation Services Pager: [phone] Office: [phone]

## 2018-01-04 NOTE — Care Management Note (Signed)
Case Management Note  Patient Details  Name: Tobin ChadDesbourns Cassell MRN: [id number] Date of Birth: 03/30/1960  Subjective/Objective:                  Uncontrolled type 2 diabetes mellitus with hyperosmolar nonketotic hyperglycemia (HCC) Iv ns at 125cc/hr Insulin being actively readjusted Glucose remains in the 300-400's Action/Plan: Following for progression and cm needs.  Expected Discharge Date:  (unknown)               Expected Discharge Plan:  Home/Self Care  In-House Referral:     Discharge planning Services  CM Consult  Post Acute Care Choice:    Choice offered to:     DME Arranged:    DME Agency:     HH Arranged:    HH Agency:     Status of Service:  In process, will continue to follow  If discussed at Long Length of Stay Meetings, dates discussed:    Additional Comments:  [name], [name], RN 01/04/2018, 9:40 AM

## 2018-01-05 LAB — GLUCOSE, CAPILLARY
GLUCOSE-CAPILLARY: 100 mg/dL — AB (ref 70–99)
Glucose-Capillary: 238 mg/dL — ABNORMAL HIGH (ref 70–99)
Glucose-Capillary: 321 mg/dL — ABNORMAL HIGH (ref 70–99)

## 2018-01-05 LAB — COMPREHENSIVE METABOLIC PANEL
ALT: 26 U/L (ref 0–44)
AST: 28 U/L (ref 15–41)
Albumin: 3.1 g/dL — ABNORMAL LOW (ref 3.5–5.0)
Alkaline Phosphatase: 82 U/L (ref 38–126)
Anion gap: 7 (ref 5–15)
BUN: 13 mg/dL (ref 6–20)
CO2: 28 mmol/L (ref 22–32)
Calcium: 9.1 mg/dL (ref 8.9–10.3)
Chloride: 106 mmol/L (ref 98–111)
Creatinine, Ser: 0.56 mg/dL — ABNORMAL LOW (ref 0.61–1.24)
GFR calc non Af Amer: >60 mL/min (ref >60)
Glucose, Bld: 222 mg/dL — ABNORMAL HIGH (ref 70–99)
Potassium: 3.9 mmol/L (ref 3.5–5.1)
Sodium: 141 mmol/L (ref 135–145)
Total Bilirubin: 0.6 mg/dL (ref 0.3–1.2)
Total Protein: 5.9 g/dL — ABNORMAL LOW (ref 6.5–8.1)

## 2018-01-05 LAB — CBC
HCT: 31.1 % — ABNORMAL LOW (ref 39.0–52.0)
Hemoglobin: 9.5 g/dL — ABNORMAL LOW (ref 13.0–17.0)
MCH: 24.7 pg — ABNORMAL LOW (ref 26.0–34.0)
MCHC: 30.5 g/dL (ref 30.0–36.0)
MCV: 80.8 fL (ref 80.0–100.0)
Platelets: 374 10*3/uL (ref 150–400)
RBC: 3.85 MIL/uL — ABNORMAL LOW (ref 4.22–5.81)
RDW: 14.1 % (ref 11.5–15.5)
WBC: 5.2 10*3/uL (ref 4.0–10.5)
nRBC: 0 % (ref 0.0–0.2)

## 2018-01-05 LAB — MAGNESIUM: Magnesium: 2.2 mg/dL (ref 1.7–2.4)

## 2018-01-05 MED ORDER — INSULIN ASPART PROT & ASPART (70-30 MIX) 100 UNIT/ML ~~LOC~~ SUSP
25.0000 [IU] | Freq: Two times a day (BID) | SUBCUTANEOUS | Status: DC
  Administered 2018-01-05 – 2018-01-11 (×12): 25 [IU] via SUBCUTANEOUS

## 2018-01-05 NOTE — Progress Notes (Signed)
PROGRESS NOTE    Mike [name]  ZOX:[id number] DOB: [birth year] DOA: 01/01/2018 PCP: [facility] [name]   Brief Narrative:  Per HPI Mike [name] is [name] 57 y.o. male with medical history significant of diabetes, hypertension, hyperlipidemia, [place] noncompliance, homelessness, anxiety disorder who was brought in by EMS due to dizziness and hypoglycemia.  Patient was found to have blood glucose of more than 1000.  His blood pressure was 216/95.  Patient was slightly altered in his mental status.  He was fully awake however in terms of response.  He reported taking his medication because he was out.  He is got his insulin but storage is [name] problem been displaced.  Patient was given IV fluids initial evaluation and is being admitted to the hospital with significant hypoxemia.  No obvious anion gap and no significant ketonuria.  Assessment & Plan:   Principal Problem:   Uncontrolled type 2 diabetes mellitus with hyperosmolar nonketotic hyperglycemia (HCC) Active Problems:   Hypertensive urgency   Tobacco abuse   Noncompliance   Hyperglycemia due to type 2 diabetes mellitus (HCC)   Pressure injury of skin  # Difficult case, especially when considering [name] safe discharge plan.  Mike [name] gets his insulin injections from the [facility] [name].  He tells me today that he does not know how much insulin that he takes and that he does not give himself insulin.  When I asked him how he gets his insulin everyday, he said most of the time he has to come here.  I discussed the case with Mike SharpsMary ann [name] at the [name] who states that she sees [name] infrequently for his insulin injections, maybe once every few weeks.  She mentioned sometimes, there is some concern that he sabotages his care (by eating sweets), but it is also clear that he is unable to care for himself.  Unfortunately, [facility] week, he would not be able to get insulin on the weekends (except 1  Saturday [name] month).  He has had numerous visits to the hospital, to either the emergency department or admissions over the past few weeks and months.  He was recently admitted on 12/8 to the ICU with DKA and shock due to hypovolemia.   He has not demonstrated the ability to care for himself independently.  Notably he was seen by psych in July of this year and determined not to have the capacity to refuse SNF placement at that point in time.  I discussed with social work (12/19) to see if we could reach [name] family member.  Apparently he has [name] son in [place].  He tells me the rest of his family is in [place].  Situation is complicated as he is apparently undocumented.  At this point in time, will try to reach family (appreciate social work assistance with this - phone number for ex wife found in chart, but when I tried to call this today on 12/21, it was disconnected).  Of note, note from 12/16 at 1414 mentions that he was picked up by EMS at "[facility]" and that she reported that "he can't care for himself" (unfortunately, I can't find any number for family or associates in chart, continue to work with social work regarding this).  Unfortunately, there's no safe discharge at this point in time.  He'd probably benefit from placement if that were possible.      #1 hyperosmolar nonketotic hyperglycemia:  Patient will be admitted  to the hospital.   Blood sugar improved, but still significantly elevated Pt apparently follows with Mike Sharps and gets insulin injections at [facility] [name], but compliance is poor He's on 70/30, but if only able to give these once daily at [facility] [name] long acting insulin like lantus may be best  Increase 70/30 to 25 units BID  Care management, social worker consulted  Will need to continue to discuss with [facility] Sharps) regarding follow up and care plan.  #2 tobacco abuse: Counseling provided.  Patient was given nicotine patch.  #3 hypertension: Improved, but still elevated,  stable Lisinopril, metop, amlodipine  #4 hyperlipidemia: Resume statin from home.  #5 homelessness: Administrator, [employer].  #6 medication noncompliance: related to homelessness as well as I suspect he has some underlying cognitive impairment  # Anemia:  Labs suggestive of AOCD.  Continue to follow closely.  Fluctuating.   DVT prophylaxis: lovenox Code Status: full  Family Communication: none at bedside Disposition Plan: pending safe discharge plan  Consultants:   none  Procedures:   none  Antimicrobials:  Anti-infectives (From admission, onward)   None     Subjective: When I asked if he could care for himself he said no sir He does not have insulin States He's never given himself insulin "Can I have graham crackers"  Objective: Vitals:   01/04/18 2100 01/05/18 0006 01/05/18 0424 01/05/18 1432  BP: (!) 192/90 (!) 153/87 (!) 161/67 (!) 142/67  Pulse:  60 (!) 55 73  Resp:  16 12 20   Temp:  98.5 F (36.9 C) 98.4 F (36.9 C) 98.9 F (37.2 C)  TempSrc:  Oral Oral Oral  SpO2:  100% 100% 100%    Intake/Output Summary (Last 24 hours) at 01/05/2018 1952 Last data filed at 01/05/2018 1700 Gross per 24 hour  Intake 2160 ml  Output 1650 ml  Net 510 ml   There were no vitals filed for this visit.  Examination:  General: No acute distress. Cardiovascular: Heart sounds show [name] regular rate, and rhythm.  Lungs: Clear to auscultation bilaterally  Abdomen: Soft, nontender, nondistended Neurological: Alert. Moves all extremities 4. Cranial nerves II through XII grossly intact. Skin: Warm and dry. No rashes or lesions. Extremities: No clubbing or cyanosis. No edema..  Data Reviewed: I have personally reviewed following labs and imaging studies  CBC: Recent Labs  Lab 01/01/18 0319 01/02/18 0246 01/03/18 0256 01/04/18 0237 01/05/18 0621  WBC 7.8 6.0 7.5 5.8 5.2  NEUTROABS 6.3  --   --   --   --   HGB 9.6* 8.5* 8.9* 8.2* 9.5*  HCT 31.9* 27.4* 29.2*  27.5* 31.1*  MCV 83.9 82.5 83.2 82.8 80.8  PLT 453* 409* 429* 365 374   Basic Metabolic Panel: Recent Labs  Lab 01/01/18 0319 01/02/18 0246 01/03/18 0256 01/04/18 0237 01/05/18 0621  NA 130* 136 137 135 141  K 4.9 3.6 3.8 3.7 3.9  CL 90* 106 107 106 106  CO2 26 22 22 24 28   GLUCOSE 1,047* 303* 183* 220* 222*  BUN 30* 20 12 12 13   CREATININE 1.53* 0.72 0.62 0.64 0.56*  CALCIUM 9.1 8.2* 8.4* 8.1* 9.1  MG  --   --  1.9 2.0 2.2   GFR: Estimated Creatinine Clearance: 112.4 mL/min (Carmelina Balducci) (by C-G formula based on SCr of 0.56 mg/dL (L)). Liver Function Tests: Recent Labs  Lab 01/02/18 0246 01/03/18 0256 01/04/18 0237 01/05/18 0621  AST 21 17 19 28   ALT 20 20 18  26  ALKPHOS 76 75 77 82  BILITOT 0.3 0.2* 0.6 0.6  PROT 5.5* 5.6* 5.3* 5.9*  ALBUMIN 2.8* 2.7* 2.6* 3.1*   No results for input(s): LIPASE, AMYLASE in the last 168 hours. No results for input(s): AMMONIA in the last 168 hours. Coagulation Profile: No results for input(s): INR, PROTIME in the last 168 hours. Cardiac Enzymes: No results for input(s): CKTOTAL, CKMB, CKMBINDEX, TROPONINI in the last 168 hours. BNP (last 3 results) No results for input(s): PROBNP in the last 8760 hours. HbA1C: No results for input(s): HGBA1C in the last 72 hours. CBG: Recent Labs  Lab 01/04/18 1610 01/04/18 2140 01/05/18 0806 01/05/18 1155 01/05/18 1712  GLUCAP 204* 175* 238* 321* 100*   Lipid Profile: No results for input(s): CHOL, HDL, LDLCALC, TRIG, CHOLHDL, LDLDIRECT in the last 72 hours. Thyroid Function Tests: No results for input(s): TSH, T4TOTAL, FREET4, T3FREE, THYROIDAB in the last 72 hours. Anemia Panel: Recent Labs    01/03/18 0256  VITAMINB12 757  FOLATE 13.0  FERRITIN 46  TIBC 223*  IRON 51   Sepsis Labs: No results for input(s): PROCALCITON, LATICACIDVEN in the last 168 hours.  No results found for this or any previous visit (from the past 240 hour(s)).       Radiology Studies: No results  found.      Scheduled Meds: . amLODipine  5 mg Oral Daily  . atorvastatin  40 mg Oral Daily  . enoxaparin (LOVENOX) injection  40 mg Subcutaneous Q24H  . feeding supplement (GLUCERNA SHAKE)  237 mL Oral TID BM  . feeding supplement (PRO-STAT SUGAR FREE 64)  30 mL Oral BID  . insulin aspart  0-20 Units Subcutaneous TID WC  . insulin aspart  0-5 Units Subcutaneous QHS  . insulin aspart protamine- aspart  25 Units Subcutaneous BID WC  . lisinopril  20 mg Oral Daily  . metoprolol tartrate  12.5 mg Oral BID  . multivitamin  1 tablet Oral Daily   Continuous Infusions:    LOS: 4 days    Time spent: over 30 min    Lacretia Nicksaldwell Powell, MD [facility] Pager [phone]  If 7PM-7AM, please contact night-coverage [email or web address] Password [email or web address] 01/05/2018, 7:52 PM

## 2018-01-05 NOTE — Progress Notes (Signed)
Entered pts room for an assessment, pts bed was saturated in urine and puddle of urine on the floor. Pt has a urinal within reach - bath provided.

## 2018-01-06 LAB — CBC
HCT: 28.2 % — ABNORMAL LOW (ref 39.0–52.0)
Hemoglobin: 8.5 g/dL — ABNORMAL LOW (ref 13.0–17.0)
MCH: 25.2 pg — ABNORMAL LOW (ref 26.0–34.0)
MCHC: 30.1 g/dL (ref 30.0–36.0)
MCV: 83.7 fL (ref 80.0–100.0)
Platelets: 362 10*3/uL (ref 150–400)
RBC: 3.37 MIL/uL — ABNORMAL LOW (ref 4.22–5.81)
RDW: 14.3 % (ref 11.5–15.5)
WBC: 6.6 10*3/uL (ref 4.0–10.5)
nRBC: 0 % (ref 0.0–0.2)

## 2018-01-06 LAB — COMPREHENSIVE METABOLIC PANEL
ALT: 37 U/L (ref 0–44)
ANION GAP: 9 (ref 5–15)
AST: 41 U/L (ref 15–41)
Albumin: 2.8 g/dL — ABNORMAL LOW (ref 3.5–5.0)
Alkaline Phosphatase: 73 U/L (ref 38–126)
BUN: 19 mg/dL (ref 6–20)
CO2: 28 mmol/L (ref 22–32)
Calcium: 8.8 mg/dL — ABNORMAL LOW (ref 8.9–10.3)
Chloride: 101 mmol/L (ref 98–111)
Creatinine, Ser: 0.78 mg/dL (ref 0.61–1.24)
GFR calc Af Amer: >60 mL/min (ref >60)
GFR calc non Af Amer: >60 mL/min (ref >60)
Glucose, Bld: 195 mg/dL — ABNORMAL HIGH (ref 70–99)
Potassium: 4 mmol/L (ref 3.5–5.1)
SODIUM: 138 mmol/L (ref 135–145)
Total Bilirubin: 0.6 mg/dL (ref 0.3–1.2)
Total Protein: 5.5 g/dL — ABNORMAL LOW (ref 6.5–8.1)

## 2018-01-06 LAB — MAGNESIUM: Magnesium: 2 mg/dL (ref 1.7–2.4)

## 2018-01-06 NOTE — Progress Notes (Signed)
PROGRESS NOTE    Mike [name]  ZOX:[id number] DOB: [birth year] DOA: 01/01/2018 PCP: [name], NP   Brief Narrative:  57 year old with history of diabetes mellitus type 2, essential hypertension, medical noncompliance, hyperlipidemia, anxiety was brought to the hospital for dizziness and hypoglycemia.  He was initially found to have significant hyperglycemia and elevated blood pressure.  He also had change in mental status.  He has been a very difficult case given his illegal immigration status/undocumented?  And poor outpatient follow-up.  Off-and-on he has been following with Maryann [name] at the [facility] for his insulin and at times visits ER for his refills.  Disposition has been extremely difficult as he remains a very high risk patient without a safe plan.  Case management is aware of this.  He also has poor understanding of his disease.   Assessment & Plan:   Principal Problem:   Uncontrolled type 2 diabetes mellitus with hyperosmolar nonketotic hyperglycemia (HCC) Active Problems:   Hypertensive urgency   Tobacco abuse   Noncompliance   Hyperglycemia due to type 2 diabetes mellitus (HCC)   Pressure injury of skin  Hyperosmolar nonketotic hyperglycemia Uncontrolled diabetes mellitus type 2, insulin-dependent - Continue 70/3025 units twice daily.  Insulin sliding scale - Supportive care.  Education has been provided.  Appreciate diabetic coordinator input. -Patient remains noncompliant with his oral diet.  Continues to keep on drinking soda.  Have advised nurses to limit this in the hospital.  Essential hypertension -Improved.  Slightly better range today in systolic 140s.  On amlodipine 5 mg daily, lisinopril 20 mg daily, metoprolol 12.5 mg twice daily  Tobacco use -Counseled to quit using this  Hyperlipidemia -Lipitor 40 mg daily  Medical noncompliance -Extensively educated on importance of compliance.  I am still not confident that patient would comply with  this.  Anemia of chronic disease -Hemoglobin at baseline around 8.5.  No obvious evidence of bleeding  DVT prophylaxis: Lovenox Code Status: Full code Family Communication: None at bedside Disposition Plan: Pending safe disposition plan  Consultants:   None  Procedures:   None   Antimicrobials:   None   Subjective: Patient laying in the bed.  Continues to ask for diet soda and food.  He is keeps on stating he is hungry and does not appear much interested in having conversation about his medical issues.  Continues to urinate in the bed even though he is able to get up and go to the bathroom.  Review of Systems Otherwise negative except as per HPI, including: General: Denies fever, chills, night sweats or unintended weight loss. Resp: Denies cough, wheezing, shortness of breath. Cardiac: Denies chest pain, palpitations, orthopnea, paroxysmal nocturnal dyspnea. GI: Denies abdominal pain, nausea, vomiting, diarrhea or constipation GU: Denies dysuria, frequency, hesitancy or incontinence MS: Denies muscle aches, joint pain or swelling Neuro: Denies headache, neurologic deficits (focal weakness, numbness, tingling), abnormal gait Psych: Denies anxiety, depression, SI/HI/AVH Skin: Denies new rashes or lesions ID: Denies sick contacts, exotic exposures, travel  Objective: Vitals:   01/05/18 1432 01/05/18 2052 01/06/18 0505 01/06/18 1158  BP: (!) 142/67 (!) 158/78 (!) 141/70 (!) 153/92  Pulse: 73 73 63 71  Resp: 20 18 14 16   Temp: 98.9 F (37.2 C) 98.6 F (37 C) 98 F (36.7 C) 98.7 F (37.1 C)  TempSrc: Oral Oral Oral Oral  SpO2: 100% 100% 97% 100%    Intake/Output Summary (Last 24 hours) at 01/06/2018 1547 Last data filed at 01/06/2018 1102 Gross per 24 hour  Intake  1140 ml  Output 4100 ml  Net -2960 ml   There were no vitals filed for this visit.  Examination:  General exam: Appears calm and comfortable, poor hygiene, disheveled Respiratory system: Clear  to auscultation. Respiratory effort normal. Cardiovascular system: S1 & S2 heard, RRR. No JVD, murmurs, rubs, gallops or clicks. No pedal edema. Gastrointestinal system: Abdomen is nondistended, soft and nontender. No organomegaly or masses felt. Normal bowel sounds heard. Central nervous system: Alert and oriented. No focal neurological deficits. Extremities: Symmetric 5 x 5 power. Skin: No rashes, lesions or ulcers Psychiatry: Judgement and insight appear normal. Mood & affect appropriate.     Data Reviewed:   CBC: Recent Labs  Lab 01/01/18 0319 01/02/18 0246 01/03/18 0256 01/04/18 0237 01/05/18 0621 01/06/18 0601  WBC 7.8 6.0 7.5 5.8 5.2 6.6  NEUTROABS 6.3  --   --   --   --   --   HGB 9.6* 8.5* 8.9* 8.2* 9.5* 8.5*  HCT 31.9* 27.4* 29.2* 27.5* 31.1* 28.2*  MCV 83.9 82.5 83.2 82.8 80.8 83.7  PLT 453* 409* 429* 365 374 362   Basic Metabolic Panel: Recent Labs  Lab 01/02/18 0246 01/03/18 0256 01/04/18 0237 01/05/18 0621 01/06/18 0601  NA 136 137 135 141 138  K 3.6 3.8 3.7 3.9 4.0  CL 106 107 106 106 101  CO2 22 22 24 28 28   GLUCOSE 303* 183* 220* 222* 195*  BUN 20 12 12 13 19   CREATININE 0.72 0.62 0.64 0.56* 0.78  CALCIUM 8.2* 8.4* 8.1* 9.1 8.8*  MG  --  1.9 2.0 2.2 2.0   GFR: Estimated Creatinine Clearance: 112.4 mL/min (by C-G formula based on SCr of 0.78 mg/dL). Liver Function Tests: Recent Labs  Lab 01/02/18 0246 01/03/18 0256 01/04/18 0237 01/05/18 0621 01/06/18 0601  AST 21 17 19 28  41  ALT 20 20 18 26  37  ALKPHOS 76 75 77 82 73  BILITOT 0.3 0.2* 0.6 0.6 0.6  PROT 5.5* 5.6* 5.3* 5.9* 5.5*  ALBUMIN 2.8* 2.7* 2.6* 3.1* 2.8*   No results for input(s): LIPASE, AMYLASE in the last 168 hours. No results for input(s): AMMONIA in the last 168 hours. Coagulation Profile: No results for input(s): INR, PROTIME in the last 168 hours. Cardiac Enzymes: No results for input(s): CKTOTAL, CKMB, CKMBINDEX, TROPONINI in the last 168 hours. BNP (last 3  results) No results for input(s): PROBNP in the last 8760 hours. HbA1C: No results for input(s): HGBA1C in the last 72 hours. CBG: Recent Labs  Lab 01/04/18 1610 01/04/18 2140 01/05/18 0806 01/05/18 1155 01/05/18 1712  GLUCAP 204* 175* 238* 321* 100*   Lipid Profile: No results for input(s): CHOL, HDL, LDLCALC, TRIG, CHOLHDL, LDLDIRECT in the last 72 hours. Thyroid Function Tests: No results for input(s): TSH, T4TOTAL, FREET4, T3FREE, THYROIDAB in the last 72 hours. Anemia Panel: No results for input(s): VITAMINB12, FOLATE, FERRITIN, TIBC, IRON, RETICCTPCT in the last 72 hours. Sepsis Labs: No results for input(s): PROCALCITON, LATICACIDVEN in the last 168 hours.  No results found for this or any previous visit (from the past 240 hour(s)).       Radiology Studies: No results found.      Scheduled Meds: . amLODipine  5 mg Oral Daily  . atorvastatin  40 mg Oral Daily  . enoxaparin (LOVENOX) injection  40 mg Subcutaneous Q24H  . feeding supplement (GLUCERNA SHAKE)  237 mL Oral TID BM  . feeding supplement (PRO-STAT SUGAR FREE 64)  30 mL Oral BID  .  insulin aspart  0-20 Units Subcutaneous TID WC  . insulin aspart  0-5 Units Subcutaneous QHS  . insulin aspart protamine- aspart  25 Units Subcutaneous BID WC  . lisinopril  20 mg Oral Daily  . metoprolol tartrate  12.5 mg Oral BID  . multivitamin  1 tablet Oral Daily   Continuous Infusions:   LOS: 5 days   Time spent= 30 mins    Kynlie Jane Joline Maxcyhirag Tyteanna Ost, MD [facility] Pager [phone]   If 7PM-7AM, please contact night-coverage [email or web address] Password [email or web address] 01/06/2018, 3:47 PM

## 2018-01-07 LAB — GLUCOSE, CAPILLARY
GLUCOSE-CAPILLARY: 221 mg/dL — AB (ref 70–99)
GLUCOSE-CAPILLARY: 237 mg/dL — AB (ref 70–99)
Glucose-Capillary: 235 mg/dL — ABNORMAL HIGH (ref 70–99)
Glucose-Capillary: 94 mg/dL (ref 70–99)
Glucose-Capillary: 175 mg/dL — ABNORMAL HIGH (ref 70–99)
Glucose-Capillary: 134 mg/dL — ABNORMAL HIGH (ref 70–99)
Glucose-Capillary: 266 mg/dL — ABNORMAL HIGH (ref 70–99)
Glucose-Capillary: 221 mg/dL — ABNORMAL HIGH (ref 70–99)
Glucose-Capillary: 61 mg/dL — ABNORMAL LOW (ref 70–99)
Glucose-Capillary: 100 mg/dL — ABNORMAL HIGH (ref 70–99)

## 2018-01-07 MED ORDER — AMLODIPINE BESYLATE 10 MG PO TABS
10.0000 mg | ORAL_TABLET | Freq: Every day | ORAL | Status: DC
  Administered 2018-01-08 – 2018-01-11 (×4): 10 mg via ORAL
  Filled 2018-01-07 (×4): qty 1

## 2018-01-07 MED ORDER — AMLODIPINE BESYLATE 5 MG PO TABS
5.0000 mg | ORAL_TABLET | Freq: Once | ORAL | Status: AC
  Administered 2018-01-07: 5 mg via ORAL
  Filled 2018-01-07: qty 1

## 2018-01-07 NOTE — Progress Notes (Signed)
PROGRESS NOTE    Mike [name]  ZOX:[id number] DOB: 1960-11-07 DOA: 01/01/2018 PCP: [name], [name], NP   Brief Narrative:  57 year old with history of diabetes mellitus type 2, essential hypertension, medical noncompliance, hyperlipidemia, anxiety was brought to the hospital for dizziness and hypoglycemia.  He was initially found to have significant hyperglycemia and elevated blood pressure.  He also had change in mental status.  He has been a very difficult case given his illegal immigration status/undocumented?  And poor outpatient follow-up.  Off-and-on he has been [name] and at times visits ER for his refills.  Disposition has been extremely difficult as he remains a very high risk patient without a safe plan.  Case management is aware of this.  He also has poor understanding of his disease.   Assessment & Plan:   Principal Problem:   Uncontrolled type 2 diabetes mellitus with hyperosmolar nonketotic hyperglycemia (HCC) Active Problems:   Hypertensive urgency   Tobacco abuse   Noncompliance   Hyperglycemia due to type 2 diabetes mellitus (HCC)   Pressure injury of skin  Hyperosmolar nonketotic hyperglycemia Uncontrolled diabetes mellitus type 2, insulin-dependent - 70/30 25 units twice daily.  Insulin sliding scale.  NovoLog 3 units pre-meals and 2 units before bedtime. - Supportive care.  Education has been provided.  Appreciate diabetic coordinator input. -Issues with even oral diet noncompliance while in the hospital.  Essential hypertension -Improved.  Slightly better range today in systolic 150s.   -Give additional Norvasc 5 mg right now, increase daily dosing to 10 mg -Continue lisinopril 20 mg daily, metoprolol 12.5 mg twice daily  Tobacco use -Counseled to quit using this  Hyperlipidemia -Lipitor 40 mg daily  Medical noncompliance -Extensively educated on importance of compliance.  I am still not confident  that patient would comply with this.  Anemia of chronic disease -Hemoglobin at baseline around 8.5.  No obvious evidence of bleeding  DVT prophylaxis: Lovenox Code Status: Full code Family Communication: None at bedside Disposition Plan: Difficult disposition  Consultants:   None  Procedures:   None   Antimicrobials:   None   Subjective: Patient is sitting up at the side of the bed putting bunch of sugars in his coffee.  I have notified the nursing staff to limit his glucose intake.  Patient keeps telling me that he is a lady lives upstairs who helps take care of him.  Review of Systems Otherwise negative except as per HPI, including: General = no fevers, chills, dizziness, malaise, fatigue HEENT/EYES = negative for pain, redness, loss of vision, double vision, blurred vision, loss of hearing, sore throat, hoarseness, dysphagia Cardiovascular= negative for chest pain, palpitation, murmurs, lower extremity swelling Respiratory/lungs= negative for shortness of breath, cough, hemoptysis, wheezing, mucus production Gastrointestinal= negative for nausea, vomiting,, abdominal pain, melena, hematemesis Genitourinary= negative for Dysuria, Hematuria, Change in Urinary Frequency MSK = Negative for arthralgia, myalgias, Back Pain, Joint swelling  Neurology= Negative for headache, seizures, numbness, tingling  Psychiatry= Negative for anxiety, depression, suicidal and homocidal ideation Allergy/Immunology= Medication/Food allergy as listed  Skin= Negative for Rash, lesions, ulcers, itching   Objective: Vitals:   01/06/18 0505 01/06/18 1158 01/06/18 2000 01/07/18 0446  BP: (!) 141/70 (!) 153/92 (!) 145/69 (!) 150/91  Pulse: 63 71 65 (!) 55  Resp: 14 16 18 18   Temp: 98 F (36.7 C) 98.7 F (37.1 C) 98.3 F (36.8 C) 98.1 F (36.7 C)  TempSrc: Oral Oral Oral Oral  SpO2: 97%  100% 100% 98%    Intake/Output Summary (Last 24 hours) at 01/07/2018 1055 Last data filed at  01/07/2018 0400 Gross per 24 hour  Intake 1320 ml  Output 1200 ml  Net 120 ml   There were no vitals filed for this visit.  Examination: Constitutional: NAD, calm, comfortable, disheveled with poor hygiene Eyes: PERRL, lids and conjunctivae normal ENMT: Mucous membranes are moist. Posterior pharynx clear of any exudate or lesions.Normal dentition.  Neck: normal, supple, no masses, no thyromegaly Respiratory: clear to auscultation bilaterally, no wheezing, no crackles. Normal respiratory effort. No accessory muscle use.  Cardiovascular: Regular rate and rhythm, no murmurs / rubs / gallops. No extremity edema. 2+ pedal pulses. No carotid bruits.  Abdomen: no tenderness, no masses palpated. No hepatosplenomegaly. Bowel sounds positive.  Musculoskeletal: no clubbing / cyanosis. No joint deformity upper and lower extremities. Good ROM, no contractures. Normal muscle tone.  Skin: no rashes, lesions, ulcers. No induration Neurologic: CN 2-12 grossly intact. Sensation intact, DTR normal. Strength 5/5 in all 4.  Psychiatric: Overall poor judgment and insight regarding his medical conditions.     Data Reviewed:   CBC: Recent Labs  Lab 01/01/18 0319 01/02/18 0246 01/03/18 0256 01/04/18 0237 01/05/18 0621 01/06/18 0601  WBC 7.8 6.0 7.5 5.8 5.2 6.6  NEUTROABS 6.3  --   --   --   --   --   HGB 9.6* 8.5* 8.9* 8.2* 9.5* 8.5*  HCT 31.9* 27.4* 29.2* 27.5* 31.1* 28.2*  MCV 83.9 82.5 83.2 82.8 80.8 83.7  PLT 453* 409* 429* 365 374 362   Basic Metabolic Panel: Recent Labs  Lab 01/02/18 0246 01/03/18 0256 01/04/18 0237 01/05/18 0621 01/06/18 0601  NA 136 137 135 141 138  K 3.6 3.8 3.7 3.9 4.0  CL 106 107 106 106 101  CO2 22 22 24 28 28   GLUCOSE 303* 183* 220* 222* 195*  BUN 20 12 12 13 19   CREATININE 0.72 0.62 0.64 0.56* 0.78  CALCIUM 8.2* 8.4* 8.1* 9.1 8.8*  MG  --  1.9 2.0 2.2 2.0   GFR: Estimated Creatinine Clearance: 112.4 mL/min (by C-G formula based on SCr of 0.78  mg/dL). Liver Function Tests: Recent Labs  Lab 01/02/18 0246 01/03/18 0256 01/04/18 0237 01/05/18 0621 01/06/18 0601  AST 21 17 19 28  41  ALT 20 20 18 26  37  ALKPHOS 76 75 77 82 73  BILITOT 0.3 0.2* 0.6 0.6 0.6  PROT 5.5* 5.6* 5.3* 5.9* 5.5*  ALBUMIN 2.8* 2.7* 2.6* 3.1* 2.8*   No results for input(s): LIPASE, AMYLASE in the last 168 hours. No results for input(s): AMMONIA in the last 168 hours. Coagulation Profile: No results for input(s): INR, PROTIME in the last 168 hours. Cardiac Enzymes: No results for input(s): CKTOTAL, CKMB, CKMBINDEX, TROPONINI in the last 168 hours. BNP (last 3 results) No results for input(s): PROBNP in the last 8760 hours. HbA1C: No results for input(s): HGBA1C in the last 72 hours. CBG: Recent Labs  Lab 01/06/18 0741 01/06/18 1301 01/06/18 1630 01/06/18 2024 01/07/18 0759  GLUCAP 221* 237* 94 175* 134*   Lipid Profile: No results for input(s): CHOL, HDL, LDLCALC, TRIG, CHOLHDL, LDLDIRECT in the last 72 hours. Thyroid Function Tests: No results for input(s): TSH, T4TOTAL, FREET4, T3FREE, THYROIDAB in the last 72 hours. Anemia Panel: No results for input(s): VITAMINB12, FOLATE, FERRITIN, TIBC, IRON, RETICCTPCT in the last 72 hours. Sepsis Labs: No results for input(s): PROCALCITON, LATICACIDVEN in the last 168 hours.  No results found for  this or any previous visit (from the past 240 hour(s)).       Radiology Studies: No results found.      Scheduled Meds: . amLODipine  5 mg Oral Daily  . atorvastatin  40 mg Oral Daily  . enoxaparin (LOVENOX) injection  40 mg Subcutaneous Q24H  . feeding supplement (GLUCERNA SHAKE)  237 mL Oral TID BM  . feeding supplement (PRO-STAT SUGAR FREE 64)  30 mL Oral BID  . insulin aspart  0-20 Units Subcutaneous TID WC  . insulin aspart  0-5 Units Subcutaneous QHS  . insulin aspart protamine- aspart  25 Units Subcutaneous BID WC  . lisinopril  20 mg Oral Daily  . metoprolol tartrate  12.5 mg Oral  BID  . multivitamin  1 tablet Oral Daily   Continuous Infusions:   LOS: 6 days   Time spent= 20 mins     Joline Maxcy, MD [facility] Pager [phone]   If 7PM-7AM, please contact night-coverage [email or web address] Password [facility] 01/07/2018, 10:55 AM

## 2018-01-08 LAB — GLUCOSE, CAPILLARY
GLUCOSE-CAPILLARY: 122 mg/dL — AB (ref 70–99)
Glucose-Capillary: 207 mg/dL — ABNORMAL HIGH (ref 70–99)
Glucose-Capillary: 93 mg/dL (ref 70–99)
Glucose-Capillary: 133 mg/dL — ABNORMAL HIGH (ref 70–99)

## 2018-01-08 LAB — CREATININE, SERUM
Creatinine, Ser: 0.66 mg/dL (ref 0.61–1.24)
GFR calc Af Amer: >60 mL/min (ref >60)
GFR calc non Af Amer: >60 mL/min (ref >60)

## 2018-01-08 NOTE — Progress Notes (Signed)
Physical Therapy Treatment Patient Details Name: Mike [name] MRN: [id number] DOB: [birth year] Today's Date: 01/08/2018    History of Present Illness 57 yo male admitted with uncontrolled DM. Hx of medical noncompliance, depression, anxiety, Sz, CKD, DM. Pt is homeless.     PT Comments    Patient observed up ad lib in hall  12/23. Ambulated today without AD. No balance losses. Patient fixated on coffee. Continue PT> Patient will need clothes from clothes closet at DC.   Follow Up Recommendations  No PT follow up     Equipment Recommendations  None recommended by PT    Recommendations for Other Services       Precautions / Restrictions Precautions Precautions: Fall    Mobility  Bed Mobility Overal bed mobility: Independent                Transfers Overall transfer level: Independent                  Ambulation/Gait Ambulation/Gait assistance: Min guard Gait Distance (Feet): 500 Feet Assistive device: None Gait Pattern/deviations: Drifts right/left     General Gait Details: Close guard for safety.    Stairs             Wheelchair Mobility    Modified Rankin (Stroke Patients Only)       Balance                                            Cognition Arousal/Alertness: Awake/alert Behavior During Therapy: Flat affect                                          Exercises      General Comments        Pertinent Vitals/Pain Pain Assessment: No/denies pain    Home Living                      Prior Function            PT Goals (current goals can now be found in the care plan section) Progress towards PT goals: Progressing toward goals    Frequency    Min 2X/week      PT Plan Current plan remains appropriate;Frequency needs to be updated    Co-evaluation              AM-PAC PT "6 Clicks" Mobility   Outcome Measure  Help needed turning from your back to your side  while in a flat bed without using bedrails?: None Help needed moving from lying on your back to sitting on the side of a flat bed without using bedrails?: None Help needed moving to and from a bed to a chair (including a wheelchair)?: None Help needed standing up from a chair using your arms (e.g., wheelchair or bedside chair)?: None Help needed to walk in hospital room?: A Little Help needed climbing 3-5 steps with a railing? : A Little 6 Click Score: 22    End of Session   Activity Tolerance: Patient tolerated treatment well Patient left: (in room with CNA) Nurse Communication: Mobility status PT Visit Diagnosis: Unsteadiness on feet (R26.81)     Time: 4098-11911201-1211 PT Time Calculation (min) (ACUTE ONLY): 10 min  Charges:  $Gait Training: 8-22 mins  Blanchard KelchKaren Ryota Treece PT Acute Rehabilitation Services Pager [phone] Office [phone]    Rada HayHill, Sahil Milner Elizabeth 01/08/2018, 12:37 PM

## 2018-01-08 NOTE — Progress Notes (Signed)
PROGRESS NOTE    Mike [name]  ZOX:[id number] DOB: 06-05-1960 DOA: 01/01/2018 PCP: [name], NP   Brief Narrative:  57 year old with history of diabetes mellitus type 2, essential hypertension, medical noncompliance, hyperlipidemia, anxiety was brought to the hospital for dizziness and hypoglycemia.  He was initially found to have significant hyperglycemia and elevated blood pressure.  He also had change in mental status.  He has been a very difficult case given his illegal immigration status/undocumented?  And poor outpatient follow-up.  Off-and-on he has been following with Maryann [name] at the [facility] for his insulin and at times visits ER for his refills.  Disposition has been extremely difficult as he remains a very high risk patient without a safe plan.  Case management is aware of this.  He also has poor understanding of his disease.   Assessment & Plan:   Principal Problem:   Uncontrolled type 2 diabetes mellitus with hyperosmolar nonketotic hyperglycemia (HCC) Active Problems:   Hypertensive urgency   Tobacco abuse   Noncompliance   Hyperglycemia due to type 2 diabetes mellitus (HCC)   Pressure injury of skin  Hyperosmolar nonketotic hyperglycemia Uncontrolled diabetes mellitus type 2, insulin-dependent - 70/30 space 25 units twice daily, insulin sliding scale.  We will add Accu-Cheks. -NovoLog 3 units pre-meals, 2 units before bedtime. - Supportive care.  Education has been provided.  Appreciate diabetic coordinator input. -Issues with even oral diet noncompliance while in the hospital.  Essential hypertension -Improved.  Slightly better range today in systolic 150s.   -Norvasc 10 mg daily -Continue lisinopril 20 mg daily, metoprolol 12.5 mg twice daily  Tobacco use -Counseled to quit using this  Hyperlipidemia -Lipitor 40 mg daily  Medical noncompliance -Extensively educated on importance of compliance.  I am still not confident that patient would comply  with this.  Anemia of chronic disease -Hemoglobin at baseline around 8.5.  No obvious evidence of bleeding  DVT prophylaxis: Lovenox Code Status: Full code Family Communication: None at bedside Disposition Plan: Difficult disposition  Consultants:   None  Procedures:   None   Antimicrobials:   None   Subjective: Laying in the bed resting comfortably, no complaints. Episode of asymptomatic hypoglycemia overnight.  Review of Systems Otherwise negative except as per HPI, including: General = no fevers, chills, dizziness, malaise, fatigue HEENT/EYES = negative for pain, redness, loss of vision, double vision, blurred vision, loss of hearing, sore throat, hoarseness, dysphagia Cardiovascular= negative for chest pain, palpitation, murmurs, lower extremity swelling Respiratory/lungs= negative for shortness of breath, cough, hemoptysis, wheezing, mucus production Gastrointestinal= negative for nausea, vomiting,, abdominal pain, melena, hematemesis Genitourinary= negative for Dysuria, Hematuria, Change in Urinary Frequency MSK = Negative for arthralgia, myalgias, Back Pain, Joint swelling  Neurology= Negative for headache, seizures, numbness, tingling  Psychiatry= Negative for anxiety, depression, suicidal and homocidal ideation Allergy/Immunology= Medication/Food allergy as listed  Skin= Negative for Rash, lesions, ulcers, itching    Objective: Vitals:   01/07/18 1354 01/07/18 2104 01/08/18 0454 01/08/18 0957  BP: (!) 143/81 (!) 146/69 134/86   Pulse: 76 64 (!) 55 70  Resp: 17 18 17    Temp: 97.9 F (36.6 C) 98.5 F (36.9 C) 98 F (36.7 C)   TempSrc:  Oral    SpO2: 100% 100% 100%     Intake/Output Summary (Last 24 hours) at 01/08/2018 1143 Last data filed at 01/08/2018 1003 Gross per 24 hour  Intake 1580 ml  Output 1950 ml  Net -370 ml   There were no vitals filed for  this visit.  Examination: Constitutional: NAD, calm, comfortable, disheveled. Eyes: PERRL,  lids and conjunctivae normal ENMT: Mucous membranes are moist. Posterior pharynx clear of any exudate or lesions.Normal dentition.  Neck: normal, supple, no masses, no thyromegaly Respiratory: clear to auscultation bilaterally, no wheezing, no crackles. Normal respiratory effort. No accessory muscle use.  Cardiovascular: Regular rate and rhythm, no murmurs / rubs / gallops. No extremity edema. 2+ pedal pulses. No carotid bruits.  Abdomen: no tenderness, no masses palpated. No hepatosplenomegaly. Bowel sounds positive.  Musculoskeletal: no clubbing / cyanosis. No joint deformity upper and lower extremities. Good ROM, no contractures. Normal muscle tone.  Skin: no rashes, lesions, ulcers. No induration Neurologic: CN 2-12 grossly intact. Sensation intact, DTR normal. Strength 5/5 in all 4.  Psychiatric: Normal judgment and insight. Alert and oriented x 3. Normal mood.      Data Reviewed:   CBC: Recent Labs  Lab 01/02/18 0246 01/03/18 0256 01/04/18 0237 01/05/18 0621 01/06/18 0601  WBC 6.0 7.5 5.8 5.2 6.6  HGB 8.5* 8.9* 8.2* 9.5* 8.5*  HCT 27.4* 29.2* 27.5* 31.1* 28.2*  MCV 82.5 83.2 82.8 80.8 83.7  PLT 409* 429* 365 374 362   Basic Metabolic Panel: Recent Labs  Lab 01/02/18 0246 01/03/18 0256 01/04/18 0237 01/05/18 0621 01/06/18 0601 01/08/18 0636  NA 136 137 135 141 138  --   K 3.6 3.8 3.7 3.9 4.0  --   CL 106 107 106 106 101  --   CO2 22 22 24 28 28   --   GLUCOSE 303* 183* 220* 222* 195*  --   BUN 20 12 12 13 19   --   CREATININE 0.72 0.62 0.64 0.56* 0.78 0.66  CALCIUM 8.2* 8.4* 8.1* 9.1 8.8*  --   MG  --  1.9 2.0 2.2 2.0  --    GFR: Estimated Creatinine Clearance: 112.4 mL/min (by C-G formula based on SCr of 0.66 mg/dL). Liver Function Tests: Recent Labs  Lab 01/02/18 0246 01/03/18 0256 01/04/18 0237 01/05/18 0621 01/06/18 0601  AST 21 17 19 28  41  ALT 20 20 18 26  37  ALKPHOS 76 75 77 82 73  BILITOT 0.3 0.2* 0.6 0.6 0.6  PROT 5.5* 5.6* 5.3* 5.9* 5.5*    ALBUMIN 2.8* 2.7* 2.6* 3.1* 2.8*   No results for input(s): LIPASE, AMYLASE in the last 168 hours. No results for input(s): AMMONIA in the last 168 hours. Coagulation Profile: No results for input(s): INR, PROTIME in the last 168 hours. Cardiac Enzymes: No results for input(s): CKTOTAL, CKMB, CKMBINDEX, TROPONINI in the last 168 hours. BNP (last 3 results) No results for input(s): PROBNP in the last 8760 hours. HbA1C: No results for input(s): HGBA1C in the last 72 hours. CBG: Recent Labs  Lab 01/07/18 1551 01/07/18 2107 01/07/18 2142 01/08/18 0725 01/08/18 1121  GLUCAP 221* 61* 100* 122* 207*   Lipid Profile: No results for input(s): CHOL, HDL, LDLCALC, TRIG, CHOLHDL, LDLDIRECT in the last 72 hours. Thyroid Function Tests: No results for input(s): TSH, T4TOTAL, FREET4, T3FREE, THYROIDAB in the last 72 hours. Anemia Panel: No results for input(s): VITAMINB12, FOLATE, FERRITIN, TIBC, IRON, RETICCTPCT in the last 72 hours. Sepsis Labs: No results for input(s): PROCALCITON, LATICACIDVEN in the last 168 hours.  No results found for this or any previous visit (from the past 240 hour(s)).       Radiology Studies: No results found.      Scheduled Meds: . amLODipine  10 mg Oral Daily  . atorvastatin  40 mg  Oral Daily  . enoxaparin (LOVENOX) injection  40 mg Subcutaneous Q24H  . feeding supplement (GLUCERNA SHAKE)  237 mL Oral TID BM  . feeding supplement (PRO-STAT SUGAR FREE 64)  30 mL Oral BID  . insulin aspart  0-20 Units Subcutaneous TID WC  . insulin aspart  0-5 Units Subcutaneous QHS  . insulin aspart protamine- aspart  25 Units Subcutaneous BID WC  . lisinopril  20 mg Oral Daily  . metoprolol tartrate  12.5 mg Oral BID  . multivitamin  1 tablet Oral Daily   Continuous Infusions:   LOS: 7 days   Time spent= 17 mins    Valarie Farace Joline Maxcyhirag Francies Inch, MD [facility] Pager [phone]   If 7PM-7AM, please contact night-coverage [email or web address] Password  [email or web address] 01/08/2018, 11:43 AM

## 2018-01-08 NOTE — Progress Notes (Signed)
CRITICAL VALUE ALERT  Critical Value: cbg 61  Date & Time Notied:  01/07/18 2107  Provider Notified: no  Orders Received/Actions taken:cbg 100 after oj

## 2018-01-08 NOTE — Care Management Note (Signed)
Case Management Note  Patient Details  Name: Mike [name] MRN: [id number] Date of Birth: [birth year]  Subjective/Objective:                  Discharge plan see csw note on 122019-unable to contact family. [name] is aware of the problem with discharge and safety.  Action/Plan:   Expected Discharge Date:  (unknown)               Expected Discharge Plan:  Home/Self Care  In-House Referral:     Discharge planning Services  CM Consult  Post Acute Care Choice:    Choice offered to:     DME Arranged:    DME Agency:     HH Arranged:    HH Agency:     Status of Service:  In process, will continue to follow  If discussed at Long Length of Stay Meetings, dates discussed:    Additional Comments:  [name], [name], RN 01/08/2018, 12:22 PM

## 2018-01-09 LAB — GLUCOSE, CAPILLARY
Glucose-Capillary: 283 mg/dL — ABNORMAL HIGH (ref 70–99)
Glucose-Capillary: 179 mg/dL — ABNORMAL HIGH (ref 70–99)

## 2018-01-09 NOTE — Progress Notes (Signed)
PROGRESS NOTE    Mike [name]  [id number] DOB: April 01, 1960 DOA: 01/01/2018 PCP: [name], [name], NP   Brief Narrative:  57 year old with history of diabetes mellitus type 2, essential hypertension, medical noncompliance, hyperlipidemia, anxiety was brought to the hospital for dizziness and hypoglycemia.  He was initially found to have significant hyperglycemia and elevated blood pressure.  He also had change in mental status.  He has been a very difficult case given his illegal immigration status/undocumented?  And poor outpatient follow-up.  Off-and-on he has been [name] and at times visits ER for his refills.  Disposition has been extremely difficult as he remains a very high risk patient without a safe plan.  Case management is aware of this.  He also has poor understanding of his disease.   Assessment & Plan:   Principal Problem:   Uncontrolled type 2 diabetes mellitus with hyperosmolar nonketotic hyperglycemia (HCC) Active Problems:   Hypertensive urgency   Tobacco abuse   Noncompliance   Hyperglycemia due to type 2 diabetes mellitus (HCC)   Pressure injury of skin  Hyperosmolar nonketotic hyperglycemia Uncontrolled diabetes mellitus type 2, insulin-dependent - Cont current regimen: 70/30 space 25 units twice daily, insulin sliding scale.  We will add Accu-Cheks. -NovoLog 3 units pre-meals, 2 units before bedtime. - Supportive care.  Education has been provided.  Appreciate diabetic coordinator input. -Issues with even oral diet noncompliance while in the hospital.  Essential hypertension -Improved.  Slightly better range today in systolic 150s.   -Norvasc 10 mg daily -Continue lisinopril 20 mg daily, metoprolol 12.5 mg twice daily  Tobacco use -Counseled to quit using this  Hyperlipidemia -Lipitor 40 mg daily  Medical noncompliance -Extensively educated on importance of compliance.  I am still not confident that  patient would comply with this.  Anemia of chronic disease -Hemoglobin at baseline around 8.5.  No obvious evidence of bleeding  DVT prophylaxis: Lovenox Code Status: Full code Family Communication: None at bedside Disposition Plan: Difficult disposition at this point  Consultants:   None  Procedures:   None   Antimicrobials:   None   Subjective: Sitting up on his bed eating breakfast without any complaints.  Review of Systems Otherwise negative except as per HPI, including: General = no fevers, chills, dizziness, malaise, fatigue HEENT/EYES = negative for pain, redness, loss of vision, double vision, blurred vision, loss of hearing, sore throat, hoarseness, dysphagia Cardiovascular= negative for chest pain, palpitation, murmurs, lower extremity swelling Respiratory/lungs= negative for shortness of breath, cough, hemoptysis, wheezing, mucus production Gastrointestinal= negative for nausea, vomiting,, abdominal pain, melena, hematemesis Genitourinary= negative for Dysuria, Hematuria, Change in Urinary Frequency MSK = Negative for arthralgia, myalgias, Back Pain, Joint swelling  Neurology= Negative for headache, seizures, numbness, tingling  Psychiatry= Negative for anxiety, depression, suicidal and homocidal ideation Allergy/Immunology= Medication/Food allergy as listed  Skin= Negative for Rash, lesions, ulcers, itching   Objective: Vitals:   01/08/18 0957 01/08/18 1310 01/08/18 2058 01/09/18 0534  BP:  119/75 (!) 151/83 (!) 152/69  Pulse: 70 62 72 63  Resp:  14 20 16   Temp:  98.4 F (36.9 C) 98.4 F (36.9 C) 98.2 F (36.8 C)  TempSrc:  Oral  Oral  SpO2:  100% 100% 99%    Intake/Output Summary (Last 24 hours) at 01/09/2018 0936 Last data filed at 01/09/2018 0813 Gross per 24 hour  Intake 1980 ml  Output 1550 ml  Net 430 ml   There were no vitals filed  for this visit.  Examination: Constitutional: NAD, calm, comfortable, appears disheveled Eyes: PERRL,  lids and conjunctivae normal ENMT: Mucous membranes are moist. Posterior pharynx clear of any exudate or lesions.Normal dentition.  Neck: normal, supple, no masses, no thyromegaly Respiratory: clear to auscultation bilaterally, no wheezing, no crackles. Normal respiratory effort. No accessory muscle use.  Cardiovascular: Regular rate and rhythm, no murmurs / rubs / gallops. No extremity edema. 2+ pedal pulses. No carotid bruits.  Abdomen: no tenderness, no masses palpated. No hepatosplenomegaly. Bowel sounds positive.  Musculoskeletal: no clubbing / cyanosis. No joint deformity upper and lower extremities. Good ROM, no contractures. Normal muscle tone.  Skin: no rashes, lesions, ulcers. No induration Neurologic: CN 2-12 grossly intact. Sensation intact, DTR normal. Strength 5/5 in all 4.  Psychiatric: Normal judgment and insight. Alert and oriented x 3. Normal mood.     Data Reviewed:   CBC: Recent Labs  Lab 01/03/18 0256 01/04/18 0237 01/05/18 0621 01/06/18 0601  WBC 7.5 5.8 5.2 6.6  HGB 8.9* 8.2* 9.5* 8.5*  HCT 29.2* 27.5* 31.1* 28.2*  MCV 83.2 82.8 80.8 83.7  PLT 429* 365 374 362   Basic Metabolic Panel: Recent Labs  Lab 01/03/18 0256 01/04/18 0237 01/05/18 0621 01/06/18 0601 01/08/18 0636  NA 137 135 141 138  --   K 3.8 3.7 3.9 4.0  --   CL 107 106 106 101  --   CO2 22 24 28 28   --   GLUCOSE 183* 220* 222* 195*  --   BUN 12 12 13 19   --   CREATININE 0.62 0.64 0.56* 0.78 0.66  CALCIUM 8.4* 8.1* 9.1 8.8*  --   MG 1.9 2.0 2.2 2.0  --    GFR: Estimated Creatinine Clearance: 112.4 mL/min (by C-G formula based on SCr of 0.66 mg/dL). Liver Function Tests: Recent Labs  Lab 01/03/18 0256 01/04/18 0237 01/05/18 0621 01/06/18 0601  AST 17 19 28  41  ALT 20 18 26  37  ALKPHOS 75 77 82 73  BILITOT 0.2* 0.6 0.6 0.6  PROT 5.6* 5.3* 5.9* 5.5*  ALBUMIN 2.7* 2.6* 3.1* 2.8*   No results for input(s): LIPASE, AMYLASE in the last 168 hours. No results for input(s): AMMONIA  in the last 168 hours. Coagulation Profile: No results for input(s): INR, PROTIME in the last 168 hours. Cardiac Enzymes: No results for input(s): CKTOTAL, CKMB, CKMBINDEX, TROPONINI in the last 168 hours. BNP (last 3 results) No results for input(s): PROBNP in the last 8760 hours. HbA1C: No results for input(s): HGBA1C in the last 72 hours. CBG: Recent Labs  Lab 01/08/18 0725 01/08/18 1121 01/08/18 1629 01/08/18 2102 01/09/18 0738  GLUCAP 122* 207* 93 133* 283*   Lipid Profile: No results for input(s): CHOL, HDL, LDLCALC, TRIG, CHOLHDL, LDLDIRECT in the last 72 hours. Thyroid Function Tests: No results for input(s): TSH, T4TOTAL, FREET4, T3FREE, THYROIDAB in the last 72 hours. Anemia Panel: No results for input(s): VITAMINB12, FOLATE, FERRITIN, TIBC, IRON, RETICCTPCT in the last 72 hours. Sepsis Labs: No results for input(s): PROCALCITON, LATICACIDVEN in the last 168 hours.  No results found for this or any previous visit (from the past 240 hour(s)).       Radiology Studies: No results found.      Scheduled Meds: . amLODipine  10 mg Oral Daily  . atorvastatin  40 mg Oral Daily  . enoxaparin (LOVENOX) injection  40 mg Subcutaneous Q24H  . feeding supplement (GLUCERNA SHAKE)  237 mL Oral TID BM  . feeding supplement (  PRO-STAT SUGAR FREE 64)  30 mL Oral BID  . insulin aspart  0-20 Units Subcutaneous TID WC  . insulin aspart  0-5 Units Subcutaneous QHS  . insulin aspart protamine- aspart  25 Units Subcutaneous BID WC  . lisinopril  20 mg Oral Daily  . metoprolol tartrate  12.5 mg Oral BID  . multivitamin  1 tablet Oral Daily   Continuous Infusions:   LOS: 8 days   Time spent= 17 mins    Ankit Joline Maxcyhirag Amin, MD [facility] Pager [phone]   If 7PM-7AM, please contact night-coverage [email or web address] Password [email or web address] 01/09/2018, 9:36 AM

## 2018-01-10 LAB — GLUCOSE, CAPILLARY: Glucose-Capillary: 275 mg/dL — ABNORMAL HIGH (ref 70–99)

## 2018-01-10 NOTE — Ethics Note (Signed)
Received call from Dr. Nelson ChimesAmin today approx 11:20 AM  Concern for how best to help patient and if he is safe to be discharged. Per Dr. Nelson ChimesAmin, this is an uncontrolled diabetic patient with frequent ER visits to get insulin, and recurrent hospital admissions for DKA. Patient is essentially homeless, living in a hotel at this point. He has family who have not agreed to accept responsibility for his care. He is undocumented and has a lot of concerns about being deported if he is found out.   I advised Dr. Nelson ChimesAmin that as long as patient has capacity to understand his health care issues (namely, diabetes and its management as well as potential complications if failure to manage properly), then his external circumstances / barriers to care cannot really be addressed very well by our current healthcare system.   Can certainly get psychiatry involved to determine capacity, if that's in question   However if patient is deemed not capable of making his own healthcare choices, then we are left with the challenge of determining who is responsible - I don't believe he can be given a state appointed guardian since he's not a citizen, at least not without getting immigration services involved. Furthermore his family has more or less washed their hands of him and probably can't be relied on for support. Getting psychiatry involved is still an option, though.   If he's safe to discharge, recommend addressing healthcare access / health literacy issues:  ask about his access to prescriptions/insulin/refrigerator etc for supplies and medicines - if he's unable to store insulin or other meds, that might be able to be addressed   see if he can get set up with PCP, or the residency clinic might be a good option rather than frequent ER visits  see if he has any spiritual/church associations that might be of help, or any other friends/family  See what social work might know about the process for legal residence in [place]   Ethics  committee is available for any further discussion as needed, and I will follow-up with Dr. Nelson ChimesAmin in a few days.      Sunnie NielsenNatalie Chelse Matas, DO [place] Ethics Committee member  [facility] Medicine Phys[facility] [place]

## 2018-01-10 NOTE — Progress Notes (Addendum)
Nutrition Follow-up  DOCUMENTATION CODES:   Underweight  INTERVENTION:   -Continue Protstat BID; each supplement provides 100 kcals and 15 grams of protein -Continue MVI daily -Continue Glucerna Shake po TID, each supplement provides 220 kcal and 10 grams of protein  NUTRITION DIAGNOSIS:   Increased nutrient needs related to wound healing(stageII left buttocks) as evidenced by estimated needs.  Ongoing.  GOAL:   Patient will meet greater than or equal to 90% of their needs  Meeting.  MONITOR:   PO intake, Labs, I & O's, Supplement acceptance, Weight trends, Skin  ASSESSMENT:   57 year old patient with medical history significant of DM, HTN, HLD, homelessness, anxiety brought in by EMS d/t dizziness and hypoglycemia  Patient currently eating well, ~100% of all meals since admission. Pt is accepting of his protein supplements as well.   Weight is the same as the previous 7 encounters.  Medications: MVI daily Labs reviewed: CBGs: 179-275  Diet Order:   Diet Order            Diet heart healthy/carb modified Room service appropriate? Yes; Fluid consistency: Thin  Diet effective now              EDUCATION NEEDS:   No education needs have been identified at this time  Skin:  Skin Assessment: Reviewed RN Assessment(stage II; left buttocks)  Last BM:  12/26  Height:   Ht Readings from Last 1 Encounters:  01/09/18 6\' 2"  (1.88 m)    Weight:   Wt Readings from Last 1 Encounters:  01/09/18 77.1 kg    Ideal Body Weight:  86.4 kg  BMI:  Body mass index is 21.82 kg/m.  Estimated Nutritional Needs:   Kcal:  2200-2500  Protein:  116-131g (1.5-1.7g/kg)  Fluid:  </=2L/day  Tilda FrancoLindsey Nathalie Cavendish, MS, RD, LDN [facility] Clinical Dietitian Pager: [phone] After Hours Pager: [phone]

## 2018-01-10 NOTE — Progress Notes (Addendum)
PROGRESS NOTE    Mike [name]  HQI:[id number] DOB: October 08, 1960 DOA: 01/01/2018 PCP: [name], [name], NP   Brief Narrative:  57 year old with history of diabetes mellitus type 2, essential hypertension, medical noncompliance, hyperlipidemia, anxiety was brought to the hospital for dizziness and hypoglycemia.  He was initially found to have significant hyperglycemia and elevated blood pressure.  He also had change in mental status.  He has been a very difficult case given his illegal immigration status/undocumented?  And poor outpatient follow-up.  Off-and-on he has [name] and at times visits ER for his refills.  Disposition has been extremely difficult as he remains a very high risk patient without a safe plan.  Case management is aware of this.  He also has poor understanding of his disease.   Assessment & Plan:   Principal Problem:   Uncontrolled type 2 diabetes mellitus with hyperosmolar nonketotic hyperglycemia (HCC) Active Problems:   Hypertensive urgency   Tobacco abuse   Noncompliance   Hyperglycemia due to type 2 diabetes mellitus (HCC)   Pressure injury of skin  Hyperosmolar nonketotic hyperglycemia Uncontrolled diabetes mellitus type 2, insulin-dependent - 70/30 space 25 units twice daily, insulin sliding scale.  We will add Accu-Cheks. -NovoLog 3 units pre-meals, 2 units before bedtime. - Supportive care.  Education has been provided.  Appreciate diabetic coordinator input.  At this point there is issues with patient being compliant with his medications.  Although he understands what needs to be done he feels to follow through appropriate instructions have been provided. I spoke with Dr. Lyn HollingsheadAlexander from ethics this morning and appreciate her input.  Essential hypertension -Improved.  Slightly better range today in systolic 150s.   -Norvasc 10 mg daily -Continue lisinopril 20 mg daily, metoprolol 12.5 mg twice  daily  Tobacco use -Counseled to quit using this  Hyperlipidemia -Lipitor 40 mg daily  Medical noncompliance -Although patient understands the importance of being compliant I am not sure if he will necessarily compliant with his medication  Anemia of chronic disease -Hemoglobin at baseline around 8.5.  No obvious evidence of bleeding  DVT prophylaxis: Lovenox Code Status: Full code Family Communication: Spoke with [place], who is "mother of his children" Disposition Plan: I will plan on discharging the patient in next 24 hours with whatever resources we are able to provide as outpatient and needs to follow-up outpatient with primary care provider.  I will speak with case management.  We will provide him with all the necessary resources- discharge to shelter by tomorrow.   Overall I believe patient has capacity to understand the severity of his situation but continues to remain very noncompliant.  Consultants:   Ethics  Procedures:   None   Antimicrobials:   None   Subjective: Denies any complaints.  Continues to tolerate oral meals well  Review of Systems Otherwise negative except as per HPI, including: General = no fevers, chills, dizziness, malaise, fatigue HEENT/EYES = negative for pain, redness, loss of vision, double vision, blurred vision, loss of hearing, sore throat, hoarseness, dysphagia Cardiovascular= negative for chest pain, palpitation, murmurs, lower extremity swelling Respiratory/lungs= negative for shortness of breath, cough, hemoptysis, wheezing, mucus production Gastrointestinal= negative for nausea, vomiting,, abdominal pain, melena, hematemesis Genitourinary= negative for Dysuria, Hematuria, Change in Urinary Frequency MSK = Negative for arthralgia, myalgias, Back Pain, Joint swelling  Neurology= Negative for headache, seizures, numbness, tingling  Psychiatry= Negative for anxiety, depression, suicidal and homocidal ideation Allergy/Immunology=  Medication/Food allergy as  listed  Skin= Negative for Rash, lesions, ulcers, itching     Objective: Vitals:   01/09/18 1623 01/09/18 2008 01/10/18 0436 01/10/18 0915  BP:  (!) 147/63 (!) 157/78 (!) 151/77  Pulse:  70 72 70  Resp:  17 20   Temp:  98.5 F (36.9 C) 98.6 F (37 C)   TempSrc:      SpO2:  100% 100%   Weight: 77.1 kg     Height: 6\' 2"  (1.88 m)       Intake/Output Summary (Last 24 hours) at 01/10/2018 1355 Last data filed at 01/10/2018 0944 Gross per 24 hour  Intake 1965 ml  Output 750 ml  Net 1215 ml   Filed Weights   01/09/18 1623  Weight: 77.1 kg    Examination: Constitutional: NAD, calm, comfortable, disheveled Eyes: PERRL, lids and conjunctivae normal ENMT: Mucous membranes are moist. Posterior pharynx clear of any exudate or lesions.Normal dentition.  Neck: normal, supple, no masses, no thyromegaly Respiratory: clear to auscultation bilaterally, no wheezing, no crackles. Normal respiratory effort. No accessory muscle use.  Cardiovascular: Regular rate and rhythm, no murmurs / rubs / gallops. No extremity edema. 2+ pedal pulses. No carotid bruits.  Abdomen: no tenderness, no masses palpated. No hepatosplenomegaly. Bowel sounds positive.  Musculoskeletal: no clubbing / cyanosis. No joint deformity upper and lower extremities. Good ROM, no contractures. Normal muscle tone.  Skin: no rashes, lesions, ulcers. No induration Neurologic: CN 2-12 grossly intact. Sensation intact, DTR normal. Strength 5/5 in all 4.  Psychiatric: Normal judgment and insight. Alert and oriented x 3. Normal mood.     Data Reviewed:   CBC: Recent Labs  Lab 01/04/18 0237 01/05/18 0621 01/06/18 0601  WBC 5.8 5.2 6.6  HGB 8.2* 9.5* 8.5*  HCT 27.5* 31.1* 28.2*  MCV 82.8 80.8 83.7  PLT 365 374 362   Basic Metabolic Panel: Recent Labs  Lab 01/04/18 0237 01/05/18 0621 01/06/18 0601 01/08/18 0636  NA 135 141 138  --   K 3.7 3.9 4.0  --   CL 106 106 101  --   CO2 24 28  28   --   GLUCOSE 220* 222* 195*  --   BUN 12 13 19   --   CREATININE 0.64 0.56* 0.78 0.66  CALCIUM 8.1* 9.1 8.8*  --   MG 2.0 2.2 2.0  --    GFR: Estimated Creatinine Clearance: 112.4 mL/min (by C-G formula based on SCr of 0.66 mg/dL). Liver Function Tests: Recent Labs  Lab 01/04/18 0237 01/05/18 0621 01/06/18 0601  AST 19 28 41  ALT 18 26 37  ALKPHOS 77 82 73  BILITOT 0.6 0.6 0.6  PROT 5.3* 5.9* 5.5*  ALBUMIN 2.6* 3.1* 2.8*   No results for input(s): LIPASE, AMYLASE in the last 168 hours. No results for input(s): AMMONIA in the last 168 hours. Coagulation Profile: No results for input(s): INR, PROTIME in the last 168 hours. Cardiac Enzymes: No results for input(s): CKTOTAL, CKMB, CKMBINDEX, TROPONINI in the last 168 hours. BNP (last 3 results) No results for input(s): PROBNP in the last 8760 hours. HbA1C: No results for input(s): HGBA1C in the last 72 hours. CBG: Recent Labs  Lab 01/08/18 1629 01/08/18 2102 01/09/18 0738 01/09/18 1107 01/10/18 0753  GLUCAP 93 133* 283* 179* 275*   Lipid Profile: No results for input(s): CHOL, HDL, LDLCALC, TRIG, CHOLHDL, LDLDIRECT in the last 72 hours. Thyroid Function Tests: No results for input(s): TSH, T4TOTAL, FREET4, T3FREE, THYROIDAB in the last 72 hours. Anemia Panel:  No results for input(s): VITAMINB12, FOLATE, FERRITIN, TIBC, IRON, RETICCTPCT in the last 72 hours. Sepsis Labs: No results for input(s): PROCALCITON, LATICACIDVEN in the last 168 hours.  No results found for this or any previous visit (from the past 240 hour(s)).       Radiology Studies: No results found.      Scheduled Meds: . amLODipine  10 mg Oral Daily  . atorvastatin  40 mg Oral Daily  . enoxaparin (LOVENOX) injection  40 mg Subcutaneous Q24H  . feeding supplement (GLUCERNA SHAKE)  237 mL Oral TID BM  . feeding supplement (PRO-STAT SUGAR FREE 64)  30 mL Oral BID  . insulin aspart  0-20 Units Subcutaneous TID WC  . insulin aspart  0-5  Units Subcutaneous QHS  . insulin aspart protamine- aspart  25 Units Subcutaneous BID WC  . lisinopril  20 mg Oral Daily  . metoprolol tartrate  12.5 mg Oral BID  . multivitamin  1 tablet Oral Daily   Continuous Infusions:   LOS: 9 days   Time spent= 30 mins    Lorrane Mccay Joline Maxcyhirag Charlsey Moragne, MD [facility] Pager [phone]   If 7PM-7AM, please contact night-coverage [email or web address] Password [email or web address] 01/10/2018, 1:55 PM

## 2018-01-10 NOTE — Care Management Note (Signed)
Case Management Note  Patient Details  Name: Tobin ChadDesbourns Seidner MRN: [id number] Date of Birth: 1960-12-16  Subjective/Objective:                  Principal Problem:   Uncontrolled type 2 diabetes mellitus with hyperosmolar nonketotic hyperglycemia (HCC) Active Problems:   Hypertensive urgency   Tobacco abuse   Noncompliance   Hyperglycemia due to type 2 diabetes mellitus (HCC)   Pressure injury of skin  Action/Plan: Difficult placement Glucose level still ranging 93-275 Expected Discharge Date:  (unknown)               Expected Discharge Plan:  Home/Self Care  In-House Referral:     Discharge planning Services  CM Consult  Post Acute Care Choice:    Choice offered to:     DME Arranged:    DME Agency:     HH Arranged:    HH Agency:     Status of Service:  In process, will continue to follow  If discussed at Long Length of Stay Meetings, dates discussed:    Additional Comments:  [name], [name], RN 01/10/2018, 9:56 AM

## 2018-01-10 NOTE — Progress Notes (Signed)
[name] following for dc planning.   [name] met at bedside with patient at bedside. Verdene Lennert, mother of patients younger children is present. Patient gave [name] permission to speak to her about his condition. [name] expressed concerns about patients homelessness, incontinence and  Diabetes.    Verdene Lennert reports that patient cannot live with her because she is in a housing program and will be kicked out. She states that patient has spent a few nights with her when he is not feeling well or weather is bad. Patient and support ask about facility placement. [name] explained that there is no payor source and patient cannot be placed. Both express understanding [place] discussed patient returning to home country of [place] and patient declined. Verdene Lennert states patient has family there that are willing to help patient.   According to [place] patients ex-spouse and adult son have not been willing to help patient in the past. Patient reports he has been in [place] for 5 years and homeless since he has been here. He came from [place] to [place] 10 yrs ago.   Patient reports that he gets his insulin from the [facility], however the [facility] is closed on the weekends. There is no plan for him to get meds on the weekends.   Patient is agreeable to dc to shelter if space available. Patient is willing to go to shelter out of the area. [name] will search for shelter availability.   Carolin Coy [facility]

## 2018-01-11 MED ORDER — BLOOD GLUCOSE MONITOR KIT: PACK | 0 refills | Status: DC

## 2018-01-11 MED ORDER — INSULIN NPH ISOPHANE & REGULAR (70-30) 100 UNIT/ML ~~LOC~~ SUSP: 20.0000 [IU] | Freq: Two times a day (BID) | SUBCUTANEOUS | 0 refills | Status: DC

## 2018-01-11 MED ORDER — AMLODIPINE BESYLATE 10 MG PO TABS: 10.0000 mg | ORAL_TABLET | Freq: Every day | ORAL | 0 refills | Status: DC

## 2018-01-11 NOTE — Care Management Note (Signed)
Case Management Note  Patient Details  Name: Amadeus Baney MRN: [id number] Date of Birth: August 12, 1960  STobin Chadubjective/Objective:                  Discharged   Action/Plan: Discharged to home with slef care, resources for care and meds given to patient.  Diabetic coordinator has seen patient and given resources also. pla is for patient to go to the shelter and to get his med[facility]. Expected Discharge Date:  01/11/18               Expected Discharge Plan:  Home/Self Care  In-House Referral:     Discharge planning Services  CM Consult  Post Acute Care Choice:    Choice offered to:     DME Arranged:    DME Agency:     HH Arranged:    HH Agency:     Status of Service:  Completed, signed off  If discussed at [employer] Length of Stay Meetings, dates discussed:    Additional Comments:  [name], [name], RN 01/11/2018, 9:54 AM

## 2018-01-11 NOTE — Progress Notes (Signed)
Physical Therapy Treatment Patient Details Name: Mike [name] MRN: [id number] DOB: [birth year] Today's Date: 01/11/2018    History of Present Illness 57 yo male admitted with uncontrolled DM. Hx of medical noncompliance, depression, anxiety, Sz, CKD, DM. Pt is homeless.     PT Comments    Pt with unsteady periods during ambulation x3, recovered by self. PT encouraged pt to take his time during mobility and to avoid performing quick movements to increase steadiness. Pt defers use of AD this session, and ambulated fair without use of one. PT to d/c to homeless shelter today.   Follow Up Recommendations  No PT follow up     Equipment Recommendations  None recommended by PT    Recommendations for Other Services       Precautions / Restrictions Precautions Precautions: Fall Restrictions Weight Bearing Restrictions: No    Mobility  Bed Mobility Overal bed mobility: Independent                Transfers Overall transfer level: Modified independent Equipment used: None Transfers: Sit to/from Stand Sit to Stand: Supervision         General transfer comment: supervision for safety, increased time to come to standing.   Ambulation/Gait Ambulation/Gait assistance: Supervision Gait Distance (Feet): 450 Feet Assistive device: None Gait Pattern/deviations: Drifts right/left Gait velocity: decr    General Gait Details: supervision for safety, pt with unsteadiness x2, with horizontal head turns. Pt corrected unsteadiness himself, no PT intervention needed.   Stairs             Wheelchair Mobility    Modified Rankin (Stroke Patients Only)       Balance Overall balance assessment: Needs assistance Sitting-balance support: No upper extremity supported Sitting balance-Leahy Scale: Normal       Standing balance-Leahy Scale: Fair Standing balance comment: Pt with some unsteadiness with horizontal head turns, recovered by pt                              Cognition Arousal/Alertness: Awake/alert Behavior During Therapy: Flat affect Overall Cognitive Status: Within Functional Limits for tasks assessed                                        Exercises      General Comments        Pertinent Vitals/Pain Pain Assessment: No/denies pain    Home Living                      Prior Function            PT Goals (current goals can now be found in the care plan section) Acute Rehab PT Goals Patient Stated Goal: none stated PT Goal Formulation: With patient Time For Goal Achievement: 01/16/18 Potential to Achieve Goals: Good Progress towards PT goals: Progressing toward goals    Frequency    Min 3X/week      PT Plan Current plan remains appropriate    Co-evaluation              AM-PAC PT "6 Clicks" Mobility   Outcome Measure  Help needed turning from your back to your side while in a flat bed without using bedrails?: None Help needed moving from lying on your back to sitting on the side of a flat bed without using bedrails?: None Help  needed moving to and from a bed to a chair (including a wheelchair)?: None Help needed standing up from a chair using your arms (e.g., wheelchair or bedside chair)?: None Help needed to walk in hospital room?: A Little Help needed climbing 3-5 steps with a railing? : A Little 6 Click Score: 22    End of Session Equipment Utilized During Treatment: Gait belt Activity Tolerance: Patient tolerated treatment well Patient left: with call bell/phone within reach;in bed(RN going to pt's room) Nurse Communication: Mobility status PT Visit Diagnosis: Unsteadiness on feet (R26.81)     Time: [id number] PT Time Calculation (min) (ACUTE ONLY): 9 min  Charges:  $Gait Training: 8-22 mins                     Nicola PoliceAlexa D Nil Xiong, PT Acute Rehabilitation Services Pager [phone]  Office [phone]   Denesia Donelan D Despina Hiddenure 01/11/2018, 5:13 PM

## 2018-01-11 NOTE — Progress Notes (Signed)
Closest shelter with bed [facility] [place].   [name] will provide taxi voucher for dc to shelter. Patient needs to arrive by 5:30.  Beulah GandyBernette Umeka Wrench, LSCW [facility] [phone]

## 2018-01-11 NOTE — Care Management Note (Signed)
Case Management Note  Patient Details  Name: Mike [name] MRN: [id number] Date of Birth: Nov 28, 1960  Subjective/Objective:                  discharged  Action/Plan: To follow up with [facility] in one week/attempted to make appointment nione available until Monday [id number]  Expected Discharge Date:  01/11/18               Expected Discharge Plan:  Home/Self Care  In-House Referral:     Discharge planning Services  CM Consult  Post Acute Care Choice:    Choice offered to:     DME Arranged:    DME Agency:     HH Arranged:    HH Agency:     Status of Service:  Completed, signed off  If discussed at [facility] Length of Stay Meetings, dates discussed:    Additional Comments:  [name], Oscar Hank Lynn, RN 01/11/2018, 8:38 AM

## 2018-01-11 NOTE — Discharge Summary (Signed)
Physician Discharge Summary  Mike [name] WUJ:[id number] DOB: 05-16-1960 DOA: 01/01/2018  PCP: [name], NP  Admit date: 01/01/2018 Discharge date: 01/11/2018  Admitted From: Unknown Disposition: Shelter  Recommendations for Outpatient Follow-up:  1. Outpatient follow-up appointment [facility]. 2. Please obtain BMP/CBC in one week your next doctors visit.  3. Discharging on 70/30 insulin to be taken twice daily.  Supplies have been given.  Discharge Condition: Stable CODE STATUS: Full code  Diet recommendation: Diabetic  Brief/Interim Summary: 57 year old with history of diabetes mellitus type 2, essential hypertension, medical noncompliance, hyperlipidemia, anxiety was brought to the hospital for dizziness and hypoglycemia.  He was initially found to have significant hyperglycemia and elevated blood pressure.  He also had change in mental status.  He has been a very difficult case given his illegal immigration status/undocumented?  And poor outpatient follow-up.  Off-and-on he has been following with Maryann [name] at the [facility] for his insulin and at times visits ER for his refills.  Disposition has been extremely difficult as he remains a very high risk patient without a safe plan.  Case was discussed by me with the ethics who stated as long as patient has capability to make decisions and understands what is going on he can be discharged to a shelter with outpatient follow-up. I discussed his case extensively with the case manager and the patient's " mother of his children", [place], who agrees with the plan. At this time I fear is extremely medically noncompliant and is at risk of recurrent readmissions.  It is very difficult to provide him with additional resources given his undocumented status in the [place].  With the assistance of the case manager, maximum help has been made available to him including his medications.    Discharge Diagnoses:  Principal Problem:    Uncontrolled type 2 diabetes mellitus with hyperosmolar nonketotic hyperglycemia (HCC) Active Problems:   Hypertensive urgency   Tobacco abuse   Noncompliance   Hyperglycemia due to type 2 diabetes mellitus (HCC)   Pressure injury of skin  Hyperosmolar nonketotic hyperglycemia, resolved Uncontrolled diabetes mellitus type 2, insulin-dependent - 70/30 changed to 20 units twice daily along with sliding scale - Supportive care.  Education has been provided.  Appreciate diabetic coordinator input. -Resources is been made available with the help of social worker.  At this point there is issues with patient being compliant with his medications.  Although he understands what needs to be done he feels to follow through appropriate instructions have been provided. I spoke with Dr. Sheppard Coil from ethics and appreciate her input.  Essential hypertension -Continue with home medications including lisinopril 20 mg daily, metoprolol 12.5 mg twice daily -Norvasc 10 mg has been added as new prescription  Tobacco use -Counseled to quit using this  Hyperlipidemia -Lipitor 40 mg daily  Medical noncompliance -Although patient understands the importance of being compliant I am not sure if he will necessarily compliant with his medication  Anemia of chronic disease -Hemoglobin at baseline around 8.5.  No obvious evidence of bleeding  Discharge the patient to shelter   Discharge Instructions   Allergies as of 01/11/2018      Reactions   Ibuprofen Nausea And Vomiting, Other (See Comments)   Makes the patient feel bloated also   Tylenol [acetaminophen] Nausea And Vomiting, Other (See Comments)   Makes the patient feel bloated also      Medication List    TAKE these medications   amLODipine 10 MG tablet Commonly known as:  NORVASC  Take 1 tablet (10 mg total) by mouth daily.   atorvastatin 40 MG tablet Commonly known as:  LIPITOR Take 1 tablet (40 mg total) by mouth daily.    blood glucose meter kit and supplies Kit Dispense based on patient and insurance preference. Use up to four times daily as directed. (FOR ICD-9 250.00, 250.01).   insulin NPH-regular Human (70-30) 100 UNIT/ML injection Commonly known as:  NOVOLIN 70/30 Inject 20 Units into the skin 2 (two) times daily with a meal. What changed:  how much to take   Insulin Pen Needle 31G X 6 MM Misc 1 Device by Does not apply route 2 (two) times daily.   lisinopril 20 MG tablet Commonly known as:  PRINIVIL,ZESTRIL Take 1 tablet (20 mg total) by mouth daily.   metoprolol tartrate 25 MG tablet Commonly known as:  LOPRESSOR Take 0.5 tablets (12.5 mg total) by mouth 2 (two) times daily.      Follow-up Information    [place]. Schedule an appointment as soon as possible for a visit in 1 week(s).   Contact information: [place] [phone]         Allergies  Allergen Reactions  . Ibuprofen Nausea And Vomiting and Other (See Comments)    Makes the patient feel bloated also  . Tylenol [Acetaminophen] Nausea And Vomiting and Other (See Comments)    Makes the patient feel bloated also    You were cared for by a hospitalist during your hospital stay. If you have any questions about your discharge medications or the care you received while you were in the hospital after you are discharged, you can call the unit and asked to speak with the hospitalist on call if the hospitalist that took care of you is not available. Once you are discharged, your primary care physician will handle any further medical issues. Please note that no refills for any discharge medications will be authorized once you are discharged, as it is imperative that you return to your primary care physician (or establish a relationship with a primary care physician if you do not have one) for your aftercare needs so that they can reassess your need for  medications and monitor your lab values.  Consultations:  None   Procedures/Studies: Dg Chest Port 1 View  Result Date: 12/23/2017 CLINICAL DATA:  57 year old male with hypotension. EXAM: PORTABLE CHEST 1 VIEW COMPARISON:  Chest radiograph dated 03/27/2017 FINDINGS: The lungs are clear. There is no pleural effusion or pneumothorax. Bilateral nipple shadows noted. Borderline cardiomegaly. No acute osseous pathology. Borderline cardiomegaly. IMPRESSION: No active disease. Electronically Signed   By: Anner Crete M.D.   On: 12/23/2017 06:57      Subjective: Patient is any complaints, tolerating oral diet without any issues.  Ambulating without any issues as well.   Discharge Exam: Vitals:   01/11/18 0507 01/11/18 0910  BP: 121/69 (!) 141/72  Pulse: (!) 53 62  Resp: 18   Temp: 98.2 F (36.8 C)   SpO2: 100%    Vitals:   01/10/18 1355 01/10/18 2003 01/11/18 0507 01/11/18 0910  BP: 139/82 (!) 143/80 121/69 (!) 141/72  Pulse: 67 71 (!) 53 62  Resp: _0 Temp: 97.6 F (36.4 C) 98.4 F (36.9 C) 98.2 F (36.8 C)   TempSrc: Oral Oral Oral   SpO2: 100% 100% 100%   Weight:      Height:  General: Pt is alert, awake, not in acute distress, appears disheveled Cardiovascular: RRR, S1/S2 +, no rubs, no gallops Respiratory: CTA bilaterally, no wheezing, no rhonchi Abdominal: Soft, NT, ND, bowel sounds + Extremities: no edema, no cyanosis    The results of significant diagnostics from this hospitalization (including imaging, microbiology, ancillary and laboratory) are listed below for reference.     Microbiology: No results found for this or any previous visit (from the past 240 hour(s)).   Labs: BNP (last 3 results) No results for input(s): BNP in the last 8760 hours. Basic Metabolic Panel: Recent Labs  Lab 01/05/18 0621 01/06/18 0601 01/08/18 0636  NA 141 138  --   K 3.9 4.0  --   CL 106 101  --   CO2 28 28  --   GLUCOSE 222* 195*  --   BUN 13 19   --   CREATININE 0.56* 0.78 0.66  CALCIUM 9.1 8.8*  --   MG 2.2 2.0  --    Liver Function Tests: Recent Labs  Lab 01/05/18 0621 01/06/18 0601  AST 28 41  ALT 26 37  ALKPHOS 82 73  BILITOT 0.6 0.6  PROT 5.9* 5.5*  ALBUMIN 3.1* 2.8*   No results for input(s): LIPASE, AMYLASE in the last 168 hours. No results for input(s): AMMONIA in the last 168 hours. CBC: Recent Labs  Lab 01/05/18 0621 01/06/18 0601  WBC 5.2 6.6  HGB 9.5* 8.5*  HCT 31.1* 28.2*  MCV 80.8 83.7  PLT 374 362   Cardiac Enzymes: No results for input(s): CKTOTAL, CKMB, CKMBINDEX, TROPONINI in the last 168 hours. BNP: Invalid input(s): POCBNP CBG: Recent Labs  Lab 01/08/18 1629 01/08/18 2102 01/09/18 0738 01/09/18 1107 01/10/18 0753  GLUCAP 93 133* 283* 179* 275*   D-Dimer No results for input(s): DDIMER in the last 72 hours. Hgb A1c No results for input(s): HGBA1C in the last 72 hours. Lipid Profile No results for input(s): CHOL, HDL, LDLCALC, TRIG, CHOLHDL, LDLDIRECT in the last 72 hours. Thyroid function studies No results for input(s): TSH, T4TOTAL, T3FREE, THYROIDAB in the last 72 hours.  Invalid input(s): FREET3 Anemia work up No results for input(s): VITAMINB12, FOLATE, FERRITIN, TIBC, IRON, RETICCTPCT in the last 72 hours. Urinalysis    Component Value Date/Time   COLORURINE COLORLESS (A) 01/01/2018 0454   APPEARANCEUR CLEAR 01/01/2018 0454   LABSPEC 1.025 01/01/2018 0454   PHURINE 5.0 01/01/2018 0454   GLUCOSEU >=500 (A) 01/01/2018 0454   HGBUR NEGATIVE 01/01/2018 0454   BILIRUBINUR NEGATIVE 01/01/2018 0454   KETONESUR 5 (A) 01/01/2018 0454   PROTEINUR NEGATIVE 01/01/2018 0454   UROBILINOGEN 1.0 02/07/2011 1605   NITRITE NEGATIVE 01/01/2018 0454   LEUKOCYTESUR NEGATIVE 01/01/2018 0454   Sepsis Labs Invalid input(s): PROCALCITONIN,  WBC,  LACTICIDVEN Microbiology No results found for this or any previous visit (from the past 240 hour(s)).   Time coordinating discharge:  I  have spent 35 minutes face to face with the patient and on the ward discussing the patients care, assessment, plan and disposition with other care givers. >50% of the time was devoted counseling the patient about the risks and benefits of treatment/Discharge disposition and coordinating care.   SIGNED:   Damita Lack, MD  [facility] 01/11/2018, 12:43 PM Pager   If 7PM-7AM, please contact night-coverage [email or web address] Password [email or web address]

## 2018-01-13 LAB — GLUCOSE, CAPILLARY
Glucose-Capillary: 152 mg/dL — ABNORMAL HIGH (ref 70–99)
Glucose-Capillary: 37 mg/dL — CL (ref 70–99)
Glucose-Capillary: 62 mg/dL — ABNORMAL LOW (ref 70–99)
Glucose-Capillary: 98 mg/dL (ref 70–99)
Glucose-Capillary: 179 mg/dL — ABNORMAL HIGH (ref 70–99)
Glucose-Capillary: 213 mg/dL — ABNORMAL HIGH (ref 70–99)
Glucose-Capillary: 87 mg/dL (ref 70–99)
Glucose-Capillary: 65 mg/dL — ABNORMAL LOW (ref 70–99)
Glucose-Capillary: 211 mg/dL — ABNORMAL HIGH (ref 70–99)
Glucose-Capillary: 172 mg/dL — ABNORMAL HIGH (ref 70–99)

## 2018-02-09 ENCOUNTER — Encounter (HOSPITAL_COMMUNITY): Payer: Self-pay

## 2018-02-09 ENCOUNTER — Emergency Department (HOSPITAL_COMMUNITY)
Admission: EM | Admit: 2018-02-09 | Discharge: 2018-02-10 | Disposition: A | Discharge status: Home / Self Care | Payer: Medicaid Other | Attending: Emergency Medicine | Admitting: Emergency Medicine

## 2018-02-09 DIAGNOSIS — I10 Essential (primary) hypertension: Secondary | ICD-10-CM | POA: Insufficient documentation

## 2018-02-09 DIAGNOSIS — F17210 Nicotine dependence, cigarettes, uncomplicated: Secondary | ICD-10-CM | POA: Diagnosis not present

## 2018-02-09 DIAGNOSIS — R739 Hyperglycemia, unspecified: Secondary | ICD-10-CM

## 2018-02-09 DIAGNOSIS — Z79899 Other long term (current) drug therapy: Secondary | ICD-10-CM | POA: Diagnosis not present

## 2018-02-09 DIAGNOSIS — E1165 Type 2 diabetes mellitus with hyperglycemia: Secondary | ICD-10-CM | POA: Insufficient documentation

## 2018-02-09 DIAGNOSIS — Z794 Long term (current) use of insulin: Secondary | ICD-10-CM | POA: Insufficient documentation

## 2018-02-09 LAB — URINALYSIS, ROUTINE W REFLEX MICROSCOPIC
BACTERIA UA: NONE SEEN
Bilirubin Urine: NEGATIVE
Glucose, UA: >=500 mg/dL — AB
Ketones, ur: NEGATIVE mg/dL
Leukocytes, UA: NEGATIVE
Nitrite: NEGATIVE
Protein, ur: 100 mg/dL — AB
Specific Gravity, Urine: 1.031 — ABNORMAL HIGH (ref 1.005–1.030)
pH: 5 (ref 5.0–8.0)

## 2018-02-09 LAB — CBC WITH DIFFERENTIAL/PLATELET
Abs Immature Granulocytes: 0.03 10*3/uL (ref 0.00–0.07)
BASOS PCT: 0 %
Basophils Absolute: 0 10*3/uL (ref 0.0–0.1)
EOS ABS: 0.2 10*3/uL (ref 0.0–0.5)
Eosinophils Relative: 2 %
HCT: 30.3 % — ABNORMAL LOW (ref 39.0–52.0)
Hemoglobin: 9.3 g/dL — ABNORMAL LOW (ref 13.0–17.0)
Immature Granulocytes: 0 %
Lymphocytes Relative: 20 %
Lymphs Abs: 1.8 10*3/uL (ref 0.7–4.0)
MCH: 24.7 pg — ABNORMAL LOW (ref 26.0–34.0)
MCHC: 30.7 g/dL (ref 30.0–36.0)
MCV: 80.4 fL (ref 80.0–100.0)
Monocytes Absolute: 0.6 10*3/uL (ref 0.1–1.0)
Monocytes Relative: 7 %
Neutro Abs: 6.5 10*3/uL (ref 1.7–7.7)
Neutrophils Relative %: 71 %
Platelets: 376 10*3/uL (ref 150–400)
RBC: 3.77 MIL/uL — ABNORMAL LOW (ref 4.22–5.81)
RDW: 12.1 % (ref 11.5–15.5)
WBC: 9.2 10*3/uL (ref 4.0–10.5)
nRBC: 0 % (ref 0.0–0.2)

## 2018-02-09 LAB — COMPREHENSIVE METABOLIC PANEL
ALT: 68 U/L — ABNORMAL HIGH (ref 0–44)
AST: 26 U/L (ref 15–41)
Albumin: 3.5 g/dL (ref 3.5–5.0)
Alkaline Phosphatase: 99 U/L (ref 38–126)
Anion gap: 11 (ref 5–15)
BILIRUBIN TOTAL: 0.2 mg/dL — AB (ref 0.3–1.2)
BUN: 11 mg/dL (ref 6–20)
CALCIUM: 9.8 mg/dL (ref 8.9–10.3)
CO2: 30 mmol/L (ref 22–32)
CREATININE: 0.96 mg/dL (ref 0.61–1.24)
Chloride: 94 mmol/L — ABNORMAL LOW (ref 98–111)
GFR calc Af Amer: >60 mL/min (ref >60)
GFR calc non Af Amer: >60 mL/min (ref >60)
Glucose, Bld: 491 mg/dL — ABNORMAL HIGH (ref 70–99)
Potassium: 3.9 mmol/L (ref 3.5–5.1)
Sodium: 135 mmol/L (ref 135–145)
Total Protein: 7.2 g/dL (ref 6.5–8.1)

## 2018-02-09 LAB — CBG MONITORING, ED
GLUCOSE-CAPILLARY: 407 mg/dL — AB (ref 70–99)
Glucose-Capillary: 476 mg/dL — ABNORMAL HIGH (ref 70–99)
Glucose-Capillary: 421 mg/dL — ABNORMAL HIGH (ref 70–99)

## 2018-02-09 MED ORDER — INSULIN ASPART 100 UNIT/ML ~~LOC~~ SOLN
8.0000 [IU] | Freq: Once | SUBCUTANEOUS | Status: AC
  Administered 2018-02-09: 8 [IU] via INTRAVENOUS

## 2018-02-09 MED ORDER — INSULIN ASPART 100 UNIT/ML ~~LOC~~ SOLN
4.0000 [IU] | Freq: Once | SUBCUTANEOUS | Status: AC
  Administered 2018-02-09: 4 [IU] via SUBCUTANEOUS

## 2018-02-09 MED ORDER — SODIUM CHLORIDE 0.9 % IV BOLUS
1000.0000 mL | Freq: Once | INTRAVENOUS | Status: AC
  Administered 2018-02-09: 1000 mL via INTRAVENOUS

## 2018-02-09 MED ORDER — INSULIN ASPART 100 UNIT/ML ~~LOC~~ SOLN
6.0000 [IU] | Freq: Once | SUBCUTANEOUS | Status: AC
  Administered 2018-02-09: 6 [IU] via SUBCUTANEOUS

## 2018-02-09 NOTE — ED Provider Notes (Signed)
[place] EMERGENCY DEPARTMENT Provider Note   CSN: [id number] Arrival date & time: 02/09/18  1938     History   Chief Complaint Chief Complaint  Patient presents with  . Hyperglycemia    HPI Mike [name] is a 58 y.o. male with PMH/o HTN, HLD, DM (uncontrolled) ration of hyperglycemia.  Patient reports that he has not taken his insulin since yesterday.  He states he had one episode of vomiting earlier today.  No blood noted in vomit.  Patient reports he has been able to eat and drink without any difficulty.  He states he has not had any abdominal pain.  Patient states he did not pick up his medication so he was not able to get his insulin today.  Patient states he called EMS that he could get his insulin.  Patient denies any chest pain, difficulty breathing, numbness/weakness of his arms or legs, fever.  The history is provided by the patient.    Past Medical History:  Diagnosis Date  . Anxiety   . Anxiety   . Chronic lower back pain   . Depression   . DKA (diabetic ketoacidoses) ([place]) 07/30/2016  . Hyperlipemia   . Hypertension   . Migraine    "last one was ~ 4 yr ago" (12/23/2014)  . Seizures ([place])    "related to pills for anxiety; if I don't take the pills I'm suppose to take I'll have them" (12/23/2014)  . Type II diabetes mellitus [facility])     Patient Active Problem List   Diagnosis Date Noted  . Hyperglycemia due to type 2 diabetes mellitus ([place]) 01/01/2018  . Pressure injury of skin 01/01/2018  . Hypotension   . Hypothermia   . 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 Hypernatremia   . Memory difficulties   . Type 2 diabetes mellitus with hyperosmolar nonketotic hyperglycemia ([place]) 08/03/2017  . Hyperglycemia 05/26/2017  . Noncompliance 05/26/2017  . AKI (acute kidney injury) ([place]) 03/04/2017  . Malnutrition of moderate degree 02/08/2017  . DKA (diabetic ketoacidoses) ([place]) 02/07/2017  . History of stroke 02/07/2017  .  Hyperphosphatemia 12/13/2016  . Evaluation by psychiatric service required 11/30/2016  . Lactic acidosis 11/18/2016  . Anemia 11/06/2016  . HLD (hyperlipidemia) 11/06/2016  . Adjustment disorder with other symptoms 11/05/2016  . Uncontrolled type 2 diabetes mellitus with hyperosmolar nonketotic hyperglycemia ([place]) 08/24/2016  . Essential hypertension 08/24/2016  . Acute ischemic stroke ([place])   . Protein-calorie malnutrition, severe 12/24/2014  . Homelessness 12/23/2014  . Hypertensive urgency 12/23/2014  . DM (diabetes mellitus) ([place]) 12/23/2014  . Intermittent palpitations 12/23/2014  . Tobacco abuse 12/23/2014  . Pleuritic chest pain 12/23/2014  . Abnormal EKG 12/23/2014  . Type II diabetes mellitus with renal manifestations [facility])     Past Surgical History:  Procedure Laterality Date  . NO PAST SURGERIES          Home Medications    Prior to Admission medications   Medication Sig Start Date End Date Taking? Authorizing Provider  amLODipine (NORVASC) 10 MG tablet Take 1 tablet (10 mg total) by mouth daily. Patient not taking: Reported on 02/09/2018 01/11/18 02/10/18  Damita Lack, MD  atorvastatin (LIPITOR) 40 MG tablet Take 1 tablet (40 mg total) by mouth daily. Patient not taking: Reported on 02/09/2018 12/26/17   Rai, Vernelle Emerald, MD  blood glucose meter kit and supplies KIT Dispense based on patient and insurance preference. Use up to four times daily as directed. (FOR ICD-9 250.00, 250.01). 12/26/17  Rai, Vernelle Emerald, MD  blood glucose meter kit and supplies KIT Dispense based on patient and insurance preference. Use up to four times daily as directed. (FOR ICD-9 250.00, 250.01). 01/11/18   Amin, Jeanella Flattery, MD  insulin NPH-regular Human (NOVOLIN 70/30) (70-30) 100 UNIT/ML injection Inject 20 Units into the skin 2 (two) times daily with a meal. Patient not taking: Reported on 02/09/2018 01/11/18 02/10/18  Damita Lack, MD  Insulin Pen Needle 31G X 6 MM MISC 1  Device by Does not apply route 2 (two) times daily. 12/26/17   Rai, Ripudeep K, MD  lisinopril (PRINIVIL,ZESTRIL) 20 MG tablet Take 1 tablet (20 mg total) by mouth daily. Patient not taking: Reported on 02/09/2018 12/26/17   Rai, Vernelle Emerald, MD  metoprolol tartrate (LOPRESSOR) 25 MG tablet Take 0.5 tablets (12.5 mg total) by mouth 2 (two) times daily. Patient not taking: Reported on 02/09/2018 12/26/17   Mendel Corning, MD    Family History Family History  Problem Relation Age of Onset  . Diabetes Mellitus II Mother   . Diabetes Mellitus II Father     Social History Social History   Tobacco Use  . Smoking status: Current Every Day Smoker    Packs/day: 0.10    Years: 40.00    Pack years: 4.00    Types: Cigarettes  . Smokeless tobacco: Never Used  Substance Use Topics  . Alcohol use: Yes    Alcohol/week: 2.0 standard drinks    Types: 2 Cans of beer per week    Comment: No labs available at time of assessment  . Drug use: No    Comment: 12/23/2014 "stopped ~ 10 yrs ago"     Allergies   Ibuprofen and Tylenol [acetaminophen]   Review of Systems Review of Systems  Constitutional: Negative for fever.  Respiratory: Negative for cough and shortness of breath.   Cardiovascular: Negative for chest pain.  Gastrointestinal: Positive for vomiting. Negative for abdominal pain and nausea.  Neurological: Negative for headaches.  All other systems reviewed and are negative.    Physical Exam Updated Vital Signs BP (!) 174/99   Pulse 61   Temp 98.4 F (36.9 C) (Oral)   Resp 20   SpO2 100%   Physical Exam Vitals signs and nursing note reviewed.  Constitutional:      Appearance: Normal appearance. He is well-developed.  HENT:     Head: Normocephalic and atraumatic.  Eyes:     General: Lids are normal.     Conjunctiva/sclera: Conjunctivae normal.     Pupils: Pupils are equal, round, and reactive to light.  Neck:     Musculoskeletal: Full passive range of motion without  pain.  Cardiovascular:     Rate and Rhythm: Normal rate and regular rhythm.     Pulses: Normal pulses.     Heart sounds: Normal heart sounds. No murmur. No friction rub. No gallop.   Pulmonary:     Effort: Pulmonary effort is normal.     Breath sounds: Normal breath sounds.  Abdominal:     Palpations: Abdomen is soft. Abdomen is not rigid.     Tenderness: There is no abdominal tenderness. There is no guarding.     Comments: Abdomen is soft, non-distended, non-tender. No rigidity, No guarding. No peritoneal signs.  Musculoskeletal: Normal range of motion.  Skin:    General: Skin is warm and dry.     Capillary Refill: Capillary refill takes less than 2 seconds.  Neurological:     Mental  Status: He is alert and oriented to person, place, and time.  Psychiatric:        Speech: Speech normal.      ED Treatments / Results  Labs (all labs ordered are listed, but only abnormal results are displayed) Labs Reviewed  COMPREHENSIVE METABOLIC PANEL - Abnormal; Notable for the following components:      Result Value   Chloride 94 (*)    Glucose, Bld 491 (*)    ALT 68 (*)    Total Bilirubin 0.2 (*)    All other components within normal limits  CBC WITH DIFFERENTIAL/PLATELET - Abnormal; Notable for the following components:   RBC 3.77 (*)    Hemoglobin 9.3 (*)    HCT 30.3 (*)    MCH 24.7 (*)    All other components within normal limits  URINALYSIS, ROUTINE W REFLEX MICROSCOPIC - Abnormal; Notable for the following components:   Specific Gravity, Urine 1.031 (*)    Glucose, UA >=500 (*)    Hgb urine dipstick MODERATE (*)    Protein, ur 100 (*)    All other components within normal limits  CBG MONITORING, ED - Abnormal; Notable for the following components:   Glucose-Capillary 476 (*)    All other components within normal limits  CBG MONITORING, ED - Abnormal; Notable for the following components:   Glucose-Capillary 421 (*)    All other components within normal limits  CBG  MONITORING, ED - Abnormal; Notable for the following components:   Glucose-Capillary 407 (*)    All other components within normal limits  CBG MONITORING, ED - Abnormal; Notable for the following components:   Glucose-Capillary 365 (*)    All other components within normal limits    EKG None  Radiology No results found.  Procedures Procedures (including critical care time)  Medications Ordered in ED Medications  insulin aspart (novoLOG) injection 6 Units (6 Units Subcutaneous Given 02/09/18 2018)  insulin aspart (novoLOG) injection 4 Units (4 Units Subcutaneous Given 02/09/18 2211)  sodium chloride 0.9 % bolus 1,000 mL (0 mLs Intravenous Stopped 02/09/18 2336)  insulin aspart (novoLOG) injection 8 Units (8 Units Intravenous Given 02/09/18 2343)     Initial Impression / Assessment and Plan / ED Course  I have reviewed the triage vital signs and the nursing notes.  Pertinent labs & imaging results that were available during my care of the patient were reviewed by me and considered in my medical decision making (see chart for details).     58 yo M past with history of diabetes (uncontrolled), hypertension who presents for evaluation of hyperglycemia.  He states he did not take his insulin today.  Patient states he has had one episode of vomiting today.  Denies any abdominal pain. Patient is afebrile, non-toxic appearing, sitting comfortably on examination table. Vital signs reviewed and stable.  Abdomen exam is benign.  POC CBG shows blood glucose in the 400s.  Patient is very well-known to the emergency department and has had 27 visits in the last 6 months.  We will plan to check basic labs to ensure no DKA and give subcutaneous insulin and reassess.  CMP shows normal bicarb.  Anion gap is unremarkable.  UA shows no ketones.  CBC shows no leukocytosis.  Hemoglobin is 9.3.  This is consistent with his baseline.  Workup not concerning for DKA.  Repeat CBG shows blood glucose 421.  Give  additional insulin.  Repeat CBG shows blood glucose of 421. Patient is actively eating several packages  of crackers which I suspect is contributing to his continued hyperglycemia.  Will give fluids, IV insulin and reasses.   Repeat CBG shows glucose 365.  Vital signs are stable.  Discussed results with patient.  Instructed patient to take his insulin medications as directed. At this time, patient exhibits no emergent life-threatening condition that require further evaluation in ED or admission. Patient had ample opportunity for questions and discussion. All patient's questions were answered with full understanding. Strict return precautions discussed. Patient expresses understanding and agreement to plan.   Portions of this note were generated with [employer]obby[employer]. Dictation errors may occur despite best attempts at proofreading.  Final Clinical Impressions(s) / ED Diagnoses   Final diagnoses:  Hyperglycemia    ED Discharge Orders    None       Volanda Napoleon, PA-C 02/10/18 [id number]    Tegeler, Gwenyth Allegra, MD 02/10/18 [phone]

## 2018-02-09 NOTE — ED Triage Notes (Signed)
Pt comes via [facility] fo hyperglycemia, homeless, unable to afford insulin and noncompliant.

## 2018-02-10 LAB — CBG MONITORING, ED: Glucose-Capillary: 365 mg/dL — ABNORMAL HIGH (ref 70–99)

## 2018-02-10 NOTE — ED Notes (Signed)
Offered pt shower prior to dischage, pt declined, pt soaked in urine, changed into clean clothes and diaper, given food and drink.

## 2018-02-10 NOTE — Discharge Instructions (Signed)
Take your medications as directed.  Follow-up with [facility].  Return emergency department for any chest pain, difficulty breathing or any other worsening or concerning symptoms.

## 2018-02-11 ENCOUNTER — Encounter (HOSPITAL_COMMUNITY): Payer: Self-pay | Admitting: Emergency Medicine

## 2018-02-11 ENCOUNTER — Emergency Department (HOSPITAL_COMMUNITY)
Admission: EM | Admit: 2018-02-11 | Discharge: 2018-02-11 | Disposition: A | Discharge status: Home / Self Care | Payer: Medicaid Other | Attending: Emergency Medicine | Admitting: Emergency Medicine

## 2018-02-11 DIAGNOSIS — Z9119 Patient's noncompliance with other medical treatment and regimen: Secondary | ICD-10-CM | POA: Diagnosis not present

## 2018-02-11 DIAGNOSIS — I10 Essential (primary) hypertension: Secondary | ICD-10-CM | POA: Diagnosis not present

## 2018-02-11 DIAGNOSIS — Z8673 Personal history of transient ischemic attack (TIA), and cerebral infarction without residual deficits: Secondary | ICD-10-CM | POA: Insufficient documentation

## 2018-02-11 DIAGNOSIS — F17210 Nicotine dependence, cigarettes, uncomplicated: Secondary | ICD-10-CM | POA: Diagnosis not present

## 2018-02-11 DIAGNOSIS — E1165 Type 2 diabetes mellitus with hyperglycemia: Secondary | ICD-10-CM | POA: Insufficient documentation

## 2018-02-11 DIAGNOSIS — R739 Hyperglycemia, unspecified: Secondary | ICD-10-CM

## 2018-02-11 DIAGNOSIS — Z794 Long term (current) use of insulin: Secondary | ICD-10-CM | POA: Insufficient documentation

## 2018-02-11 LAB — BASIC METABOLIC PANEL
Anion gap: 10 (ref 5–15)
BUN: 17 mg/dL (ref 6–20)
CHLORIDE: 95 mmol/L — AB (ref 98–111)
CO2: 31 mmol/L (ref 22–32)
Calcium: 9.9 mg/dL (ref 8.9–10.3)
Creatinine, Ser: 0.92 mg/dL (ref 0.61–1.24)
GFR calc Af Amer: >60 mL/min (ref >60)
GFR calc non Af Amer: >60 mL/min (ref >60)
Glucose, Bld: 428 mg/dL — ABNORMAL HIGH (ref 70–99)
Potassium: 3.8 mmol/L (ref 3.5–5.1)
Sodium: 136 mmol/L (ref 135–145)

## 2018-02-11 LAB — CBG MONITORING, ED
Glucose-Capillary: 415 mg/dL — ABNORMAL HIGH (ref 70–99)
Glucose-Capillary: 340 mg/dL — ABNORMAL HIGH (ref 70–99)

## 2018-02-11 MED ORDER — INSULIN ASPART 100 UNIT/ML ~~LOC~~ SOLN
10.0000 [IU] | Freq: Once | SUBCUTANEOUS | Status: AC | PRN
  Administered 2018-02-11: 10 [IU] via SUBCUTANEOUS

## 2018-02-11 NOTE — ED Provider Notes (Signed)
[place] EMERGENCY DEPARTMENT Provider Note   CSN: [id number] Arrival date & time: 02/11/18  1909     History   Chief Complaint Chief Complaint  Patient presents with  . Hyperglycemia    HPI Mike [name] is a 58 y.o. male.   Hyperglycemia  Blood sugar level PTA:  >600 ? Severity:  Mild Onset quality:  Gradual Timing:  Constant Progression:  Worsening Chronicity:  Chronic Diabetes status:  Controlled with insulin Context: noncompliance   Relieved by:  None tried Ineffective treatments:  None tried Associated symptoms: no abdominal pain     Past Medical History:  Diagnosis Date  . Anxiety   . Anxiety   . Chronic lower back pain   . Depression   . DKA (diabetic ketoacidoses) ([place]) 07/30/2016  . Hyperlipemia   . Hypertension   . Migraine    "last one was ~ 4 yr ago" (12/23/2014)  . Seizures ([place])    "related to pills for anxiety; if I don't take the pills I'm suppose to take I'll have them" (12/23/2014)  . Type II diabetes mellitus [facility])     Patient Active Problem List   Diagnosis Date Noted  . Hyperglycemia due to type 2 diabetes mellitus ([place]) 01/01/2018  . Pressure injury of skin 01/01/2018  . Hypotension   . Hypothermia   . 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 Hypernatremia   . Memory difficulties   . Type 2 diabetes mellitus with hyperosmolar nonketotic hyperglycemia ([place]) 08/03/2017  . Hyperglycemia 05/26/2017  . Noncompliance 05/26/2017  . AKI (acute kidney injury) ([place]) 03/04/2017  . Malnutrition of moderate degree 02/08/2017  . DKA (diabetic ketoacidoses) ([place]) 02/07/2017  . History of stroke 02/07/2017  . Hyperphosphatemia 12/13/2016  . Evaluation by psychiatric service required 11/30/2016  . Lactic acidosis 11/18/2016  . Anemia 11/06/2016  . HLD (hyperlipidemia) 11/06/2016  . Adjustment disorder with other symptoms 11/05/2016  . Uncontrolled type 2 diabetes mellitus with hyperosmolar nonketotic  hyperglycemia ([place]) 08/24/2016  . Essential hypertension 08/24/2016  . Acute ischemic stroke ([place])   . Protein-calorie malnutrition, severe 12/24/2014  . Homelessness 12/23/2014  . Hypertensive urgency 12/23/2014  . DM (diabetes mellitus) ([place]) 12/23/2014  . Intermittent palpitations 12/23/2014  . Tobacco abuse 12/23/2014  . Pleuritic chest pain 12/23/2014  . Abnormal EKG 12/23/2014  . Type II diabetes mellitus with renal manifestations [facility])     Past Surgical History:  Procedure Laterality Date  . NO PAST SURGERIES          Home Medications    Prior to Admission medications   Medication Sig Start Date End Date Taking? Authorizing Provider  blood glucose meter kit and supplies KIT Dispense based on patient and insurance preference. Use up to four times daily as directed. (FOR ICD-9 250.00, 250.01). 12/26/17   Rai, Ripudeep K, MD  blood glucose meter kit and supplies KIT Dispense based on patient and insurance preference. Use up to four times daily as directed. (FOR ICD-9 250.00, 250.01). 01/11/18   Amin, Jeanella Flattery, MD  insulin NPH-regular Human (NOVOLIN 70/30) (70-30) 100 UNIT/ML injection Inject 20 Units into the skin 2 (two) times daily with a meal. Patient not taking: Reported on 02/09/2018 01/11/18 02/10/18  Damita Lack, MD  Insulin Pen Needle 31G X 6 MM MISC 1 Device by Does not apply route 2 (two) times daily. 12/26/17   Rai, Ripudeep K, MD  lisinopril (PRINIVIL,ZESTRIL) 20 MG tablet Take 1 tablet (20 mg total) by mouth daily. Patient not  taking: Reported on 02/09/2018 12/26/17   Rai, Vernelle Emerald, MD  metoprolol tartrate (LOPRESSOR) 25 MG tablet Take 0.5 tablets (12.5 mg total) by mouth 2 (two) times daily. Patient not taking: Reported on 02/09/2018 12/26/17   Mendel Corning, MD    Family History Family History  Problem Relation Age of Onset  . Diabetes Mellitus II Mother   . Diabetes Mellitus II Father     Social History Social History   Tobacco Use  .  Smoking status: Current Every Day Smoker    Packs/day: 0.10    Years: 40.00    Pack years: 4.00    Types: Cigarettes  . Smokeless tobacco: Never Used  Substance Use Topics  . Alcohol use: Yes    Alcohol/week: 2.0 standard drinks    Types: 2 Cans of beer per week    Comment: No labs available at time of assessment  . Drug use: No    Comment: 12/23/2014 "stopped ~ 10 yrs ago"     Allergies   Ibuprofen and Tylenol [acetaminophen]   Review of Systems Review of Systems  Gastrointestinal: Negative for abdominal pain.  All other systems reviewed and are negative.    Physical Exam Updated Vital Signs BP (!) 185/88 (BP Location: Right Arm)   Pulse 61   Temp 98.3 F (36.8 C) (Oral)   Resp 19   SpO2 100%   Physical Exam Vitals signs and nursing note reviewed.  Constitutional:      Appearance: He is well-developed.  HENT:     Head: Normocephalic and atraumatic.     Mouth/Throat:     Mouth: Mucous membranes are moist.  Eyes:     Pupils: Pupils are equal, round, and reactive to light.  Neck:     Musculoskeletal: Normal range of motion.  Cardiovascular:     Rate and Rhythm: Normal rate.  Pulmonary:     Effort: Pulmonary effort is normal. No respiratory distress.  Abdominal:     General: Abdomen is flat. There is no distension.  Musculoskeletal: Normal range of motion.        General: No swelling or tenderness.  Skin:    General: Skin is warm and dry.  Neurological:     General: No focal deficit present.     Mental Status: He is alert.      ED Treatments / Results  Labs (all labs ordered are listed, but only abnormal results are displayed) Labs Reviewed  BASIC METABOLIC PANEL - Abnormal; Notable for the following components:      Result Value   Chloride 95 (*)    Glucose, Bld 428 (*)    All other components within normal limits  CBG MONITORING, ED - Abnormal; Notable for the following components:   Glucose-Capillary 415 (*)    All other components within  normal limits    EKG None  Radiology No results found.  Procedures Procedures (including critical care time)  Medications Ordered in ED Medications  insulin aspart (novoLOG) injection 10 Units (10 Units Subcutaneous Given 02/11/18 2002)     Initial Impression / Assessment and Plan / ED Course  I have reviewed the triage vital signs and the nursing notes.  Pertinent labs & imaging results that were available during my care of the patient were reviewed by me and considered in my medical decision making (see chart for details).     Hyperglycemia. Non compliant. Insulin given here. VS WNL. Doubt medical emergency at this time. Stable for dc w/ pcp follo  wup.   Final Clinical Impressions(s) / ED Diagnoses   Final diagnoses:  Hyperglycemia    ED Discharge Orders    None       Mayli Covington, Corene Cornea, MD 02/11/18 2105

## 2018-02-11 NOTE — ED Triage Notes (Signed)
Pt arrives via EMS with reports of hyperglycemia. CBG greater than 600.

## 2018-02-11 NOTE — ED Notes (Signed)
Patient verbalizes understanding of discharge instructions. Opportunity for questioning and answers were provided. Armband removed by staff, pt discharged from ED home via bus.  

## 2018-02-14 ENCOUNTER — Encounter (HOSPITAL_COMMUNITY): Payer: Self-pay | Admitting: General Practice

## 2018-02-14 ENCOUNTER — Other Ambulatory Visit: Payer: Self-pay

## 2018-02-14 ENCOUNTER — Emergency Department (HOSPITAL_COMMUNITY): Payer: Medicaid Other

## 2018-02-14 ENCOUNTER — Inpatient Hospital Stay (HOSPITAL_COMMUNITY)
Admission: EM | Admit: 2018-02-14 | Discharge: 2018-02-19 | DRG: 637 | Disposition: A | Discharge status: Home / Self Care | Payer: Medicaid Other | Attending: Internal Medicine | Admitting: Internal Medicine

## 2018-02-14 DIAGNOSIS — Z888 Allergy status to other drugs, medicaments and biological substances status: Secondary | ICD-10-CM | POA: Diagnosis not present

## 2018-02-14 DIAGNOSIS — I10 Essential (primary) hypertension: Secondary | ICD-10-CM | POA: Diagnosis present

## 2018-02-14 DIAGNOSIS — F17210 Nicotine dependence, cigarettes, uncomplicated: Secondary | ICD-10-CM | POA: Diagnosis present

## 2018-02-14 DIAGNOSIS — Z9114 Patient's other noncompliance with medication regimen: Secondary | ICD-10-CM

## 2018-02-14 DIAGNOSIS — R262 Difficulty in walking, not elsewhere classified: Secondary | ICD-10-CM | POA: Diagnosis present

## 2018-02-14 DIAGNOSIS — E870 Hyperosmolality and hypernatremia: Secondary | ICD-10-CM

## 2018-02-14 DIAGNOSIS — N179 Acute kidney failure, unspecified: Secondary | ICD-10-CM | POA: Diagnosis present

## 2018-02-14 DIAGNOSIS — Z681 Body mass index (BMI) 19 or less, adult: Secondary | ICD-10-CM | POA: Diagnosis not present

## 2018-02-14 DIAGNOSIS — E860 Dehydration: Secondary | ICD-10-CM | POA: Diagnosis present

## 2018-02-14 DIAGNOSIS — E1159 Type 2 diabetes mellitus with other circulatory complications: Secondary | ICD-10-CM

## 2018-02-14 DIAGNOSIS — R5381 Other malaise: Secondary | ICD-10-CM | POA: Diagnosis present

## 2018-02-14 DIAGNOSIS — E1110 Type 2 diabetes mellitus with ketoacidosis without coma: Principal | ICD-10-CM | POA: Diagnosis present

## 2018-02-14 DIAGNOSIS — Z886 Allergy status to analgesic agent status: Secondary | ICD-10-CM

## 2018-02-14 DIAGNOSIS — G9341 Metabolic encephalopathy: Secondary | ICD-10-CM | POA: Diagnosis present

## 2018-02-14 DIAGNOSIS — R509 Fever, unspecified: Secondary | ICD-10-CM | POA: Diagnosis present

## 2018-02-14 DIAGNOSIS — Z833 Family history of diabetes mellitus: Secondary | ICD-10-CM

## 2018-02-14 DIAGNOSIS — E1310 Other specified diabetes mellitus with ketoacidosis without coma: Secondary | ICD-10-CM

## 2018-02-14 LAB — BASIC METABOLIC PANEL
Anion gap: 19 — ABNORMAL HIGH (ref 5–15)
Anion gap: 10 (ref 5–15)
BUN: 68 mg/dL — ABNORMAL HIGH (ref 6–20)
BUN: 68 mg/dL — ABNORMAL HIGH (ref 6–20)
CO2: 24 mmol/L (ref 22–32)
CO2: 24 mmol/L (ref 22–32)
Calcium: 9.6 mg/dL (ref 8.9–10.3)
Calcium: 9.3 mg/dL (ref 8.9–10.3)
Chloride: 105 mmol/L (ref 98–111)
Chloride: 119 mmol/L — ABNORMAL HIGH (ref 98–111)
Creatinine, Ser: 3.32 mg/dL — ABNORMAL HIGH (ref 0.61–1.24)
Creatinine, Ser: 2.35 mg/dL — ABNORMAL HIGH (ref 0.61–1.24)
GFR calc Af Amer: 23 mL/min — ABNORMAL LOW (ref >60)
GFR calc Af Amer: 35 mL/min — ABNORMAL LOW (ref >60)
GFR calc non Af Amer: 20 mL/min — ABNORMAL LOW (ref >60)
GFR calc non Af Amer: 30 mL/min — ABNORMAL LOW (ref >60)
Glucose, Bld: 825 mg/dL (ref 70–99)
Glucose, Bld: 216 mg/dL — ABNORMAL HIGH (ref 70–99)
Potassium: 4.7 mmol/L (ref 3.5–5.1)
Potassium: 4.4 mmol/L (ref 3.5–5.1)
Sodium: 148 mmol/L — ABNORMAL HIGH (ref 135–145)
Sodium: 153 mmol/L — ABNORMAL HIGH (ref 135–145)

## 2018-02-14 LAB — CBC
HCT: 40.5 % (ref 39.0–52.0)
Hemoglobin: 12.3 g/dL — ABNORMAL LOW (ref 13.0–17.0)
MCH: 24.8 pg — AB (ref 26.0–34.0)
MCHC: 30.4 g/dL (ref 30.0–36.0)
MCV: 81.7 fL (ref 80.0–100.0)
Platelets: 356 10*3/uL (ref 150–400)
RBC: 4.96 MIL/uL (ref 4.22–5.81)
RDW: 12.9 % (ref 11.5–15.5)
WBC: 11.1 10*3/uL — ABNORMAL HIGH (ref 4.0–10.5)
nRBC: 0 % (ref 0.0–0.2)

## 2018-02-14 LAB — GLUCOSE, CAPILLARY
GLUCOSE-CAPILLARY: 305 mg/dL — AB (ref 70–99)
GLUCOSE-CAPILLARY: 333 mg/dL — AB (ref 70–99)
Glucose-Capillary: 496 mg/dL — ABNORMAL HIGH (ref 70–99)
Glucose-Capillary: 441 mg/dL — ABNORMAL HIGH (ref 70–99)
Glucose-Capillary: 175 mg/dL — ABNORMAL HIGH (ref 70–99)
Glucose-Capillary: 148 mg/dL — ABNORMAL HIGH (ref 70–99)
Glucose-Capillary: 131 mg/dL — ABNORMAL HIGH (ref 70–99)

## 2018-02-14 LAB — CBG MONITORING, ED
Glucose-Capillary: >600 mg/dL (ref 70–99)
Glucose-Capillary: >600 mg/dL (ref 70–99)
Glucose-Capillary: >600 mg/dL (ref 70–99)

## 2018-02-14 LAB — POCT I-STAT EG7
Acid-Base Excess: 2 mmol/L (ref 0.0–2.0)
BICARBONATE: 27.7 mmol/L (ref 20.0–28.0)
Calcium, Ion: 1.12 mmol/L — ABNORMAL LOW (ref 1.15–1.40)
HCT: 35 % — ABNORMAL LOW (ref 39.0–52.0)
Hemoglobin: 11.9 g/dL — ABNORMAL LOW (ref 13.0–17.0)
O2 Saturation: 94 %
Potassium: 4.9 mmol/L (ref 3.5–5.1)
Sodium: 148 mmol/L — ABNORMAL HIGH (ref 135–145)
TCO2: 29 mmol/L (ref 22–32)
pCO2, Ven: 46.9 mmHg (ref 44.0–60.0)
pH, Ven: 7.379 (ref 7.250–7.430)
pO2, Ven: 75 mmHg — ABNORMAL HIGH (ref 32.0–45.0)

## 2018-02-14 LAB — MRSA PCR SCREENING: MRSA by PCR: NEGATIVE

## 2018-02-14 MED ORDER — SODIUM CHLORIDE 0.9 % IV BOLUS
1000.0000 mL | Freq: Once | INTRAVENOUS | Status: AC
  Administered 2018-02-14: 1000 mL via INTRAVENOUS

## 2018-02-14 MED ORDER — INSULIN REGULAR(HUMAN) IN NACL 100-0.9 UT/100ML-% IV SOLN
INTRAVENOUS | Status: DC
  Administered 2018-02-14: 5.4 [IU]/h via INTRAVENOUS
  Filled 2018-02-14: qty 100

## 2018-02-14 MED ORDER — ONDANSETRON HCL 4 MG/2ML IJ SOLN
4.0000 mg | Freq: Four times a day (QID) | INTRAMUSCULAR | Status: DC | PRN
  Administered 2018-02-15: 4 mg via INTRAVENOUS
  Filled 2018-02-14: qty 2

## 2018-02-14 MED ORDER — ONDANSETRON HCL 4 MG PO TABS: 4.0000 mg | ORAL_TABLET | Freq: Four times a day (QID) | ORAL | Status: DC | PRN

## 2018-02-14 MED ORDER — DEXTROSE-NACL 5-0.45 % IV SOLN
INTRAVENOUS | Status: DC
  Administered 2018-02-14: 21:00:00 via INTRAVENOUS

## 2018-02-14 MED ORDER — METOPROLOL TARTRATE 12.5 MG HALF TABLET
12.5000 mg | ORAL_TABLET | Freq: Two times a day (BID) | ORAL | Status: DC
  Administered 2018-02-14 – 2018-02-16 (×4): 12.5 mg via ORAL
  Filled 2018-02-14 (×4): qty 1

## 2018-02-14 MED ORDER — HEPARIN SODIUM (PORCINE) 5000 UNIT/ML IJ SOLN
5000.0000 [IU] | Freq: Three times a day (TID) | INTRAMUSCULAR | Status: DC
  Administered 2018-02-14 – 2018-02-19 (×15): 5000 [IU] via SUBCUTANEOUS
  Filled 2018-02-14 (×15): qty 1

## 2018-02-14 MED ORDER — ORAL CARE MOUTH RINSE
15.0000 mL | Freq: Two times a day (BID) | OROMUCOSAL | Status: DC
  Administered 2018-02-14 – 2018-02-18 (×7): 15 mL via OROMUCOSAL

## 2018-02-14 MED ORDER — SODIUM CHLORIDE 0.45 % IV SOLN
INTRAVENOUS | Status: DC
  Administered 2018-02-14: 15:00:00 via INTRAVENOUS

## 2018-02-14 MED ORDER — SODIUM CHLORIDE 0.45 % IV SOLN
INTRAVENOUS | Status: DC
  Administered 2018-02-14: 17:00:00 via INTRAVENOUS

## 2018-02-14 MED ORDER — ALBUTEROL SULFATE (2.5 MG/3ML) 0.083% IN NEBU: 2.5000 mg | INHALATION_SOLUTION | RESPIRATORY_TRACT | Status: DC | PRN

## 2018-02-14 MED ORDER — POTASSIUM CHLORIDE 10 MEQ/100ML IV SOLN
10.0000 meq | INTRAVENOUS | Status: AC
  Administered 2018-02-14 (×2): 10 meq via INTRAVENOUS
  Filled 2018-02-14 (×2): qty 100

## 2018-02-14 MED ORDER — HYDRALAZINE HCL 20 MG/ML IJ SOLN
5.0000 mg | INTRAMUSCULAR | Status: DC | PRN
  Administered 2018-02-14 – 2018-02-17 (×5): 5 mg via INTRAVENOUS
  Filled 2018-02-14 (×5): qty 1

## 2018-02-14 NOTE — ED Triage Notes (Signed)
Pt brought in by ems for c/o hyperglycemia; pt stays at hotel and staff checked on him this morning and noticed that he wasnt " responsive" and had urine all over himself ; upon ems arrival , pt was not unresponsive and was walking to bathroom ; ems reports patient is not compliant with dm medication and read high on their glucometer

## 2018-02-14 NOTE — ED Provider Notes (Signed)
[facility] EMERGENCY DEPARTMENT Provider Note   CSN: [id number] Arrival date & time: 02/14/18  1209     History   Chief Complaint Chief Complaint  Patient presents with  . Hyperglycemia    HPI Mike [name] is a 58 y.o. male.  Patient well-known to the emergency department with high blood sugar presents to the emergency department by EMS.  EMS was called by hotel staff.  They noted that he was not responsive this morning.  Blood sugar read high on glucometer.  Patient is awake and alert, not talking.  He shakes his head no when asked if he is having any pain, cough, vomiting, abdominal pain.  Level 5 caveat due to altered mental status.     Past Medical History:  Diagnosis Date  . Anxiety   . Anxiety   . Chronic lower back pain   . Depression   . DKA (diabetic ketoacidoses) ([place]) 07/30/2016  . Hyperlipemia   . Hypertension   . Migraine    "last one was ~ 4 yr ago" (12/23/2014)  . Seizures ([place])    "related to pills for anxiety; if I don't take the pills I'm suppose to take I'll have them" (12/23/2014)  . Type II diabetes mellitus [facility])     Patient Active Problem List   Diagnosis Date Noted  . Hyperglycemia due to type 2 diabetes mellitus ([place]) 01/01/2018  . Pressure injury of skin 01/01/2018  . Hypotension   . Hypothermia   . 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 Hypernatremia   . Memory difficulties   . Type 2 diabetes mellitus with hyperosmolar nonketotic hyperglycemia ([place]) 08/03/2017  . Hyperglycemia 05/26/2017  . Noncompliance 05/26/2017  . AKI (acute kidney injury) ([place]) 03/04/2017  . Malnutrition of moderate degree 02/08/2017  . DKA (diabetic ketoacidoses) ([place]) 02/07/2017  . History of stroke 02/07/2017  . Hyperphosphatemia 12/13/2016  . Evaluation by psychiatric service required 11/30/2016  . Lactic acidosis 11/18/2016  . Anemia 11/06/2016  . HLD (hyperlipidemia) 11/06/2016  . Adjustment disorder with other symptoms  11/05/2016  . Uncontrolled type 2 diabetes mellitus with hyperosmolar nonketotic hyperglycemia ([place]) 08/24/2016  . Essential hypertension 08/24/2016  . Acute ischemic stroke ([place])   . Protein-calorie malnutrition, severe 12/24/2014  . Homelessness 12/23/2014  . Hypertensive urgency 12/23/2014  . DM (diabetes mellitus) ([place]) 12/23/2014  . Intermittent palpitations 12/23/2014  . Tobacco abuse 12/23/2014  . Pleuritic chest pain 12/23/2014  . Abnormal EKG 12/23/2014  . Type II diabetes mellitus with renal manifestations [facility])     Past Surgical History:  Procedure Laterality Date  . NO PAST SURGERIES          Home Medications    Prior to Admission medications   Medication Sig Start Date End Date Taking? Authorizing Provider  blood glucose meter kit and supplies KIT Dispense based on patient and insurance preference. Use up to four times daily as directed. (FOR ICD-9 250.00, 250.01). 12/26/17   Rai, Ripudeep K, MD  blood glucose meter kit and supplies KIT Dispense based on patient and insurance preference. Use up to four times daily as directed. (FOR ICD-9 250.00, 250.01). 01/11/18   Amin, Jeanella Flattery, MD  insulin NPH-regular Human (NOVOLIN 70/30) (70-30) 100 UNIT/ML injection Inject 20 Units into the skin 2 (two) times daily with a meal. Patient not taking: Reported on 02/09/2018 01/11/18 02/10/18  Damita Lack, MD  Insulin Pen Needle 31G X 6 MM MISC 1 Device by Does not apply route 2 (two) times  daily. 12/26/17   Rai, Vernelle Emerald, MD  lisinopril (PRINIVIL,ZESTRIL) 20 MG tablet Take 1 tablet (20 mg total) by mouth daily. Patient not taking: Reported on 02/09/2018 12/26/17   Rai, Vernelle Emerald, MD  metoprolol tartrate (LOPRESSOR) 25 MG tablet Take 0.5 tablets (12.5 mg total) by mouth 2 (two) times daily. Patient not taking: Reported on 02/09/2018 12/26/17   Mendel Corning, MD    Family History Family History  Problem Relation Age of Onset  . Diabetes Mellitus II Mother   .  Diabetes Mellitus II Father     Social History Social History   Tobacco Use  . Smoking status: Current Every Day Smoker    Packs/day: 0.10    Years: 40.00    Pack years: 4.00    Types: Cigarettes  . Smokeless tobacco: Never Used  Substance Use Topics  . Alcohol use: Yes    Alcohol/week: 2.0 standard drinks    Types: 2 Cans of beer per week    Comment: No labs available at time of assessment  . Drug use: No    Comment: 12/23/2014 "stopped ~ 10 yrs ago"     Allergies   Ibuprofen and Tylenol [acetaminophen]   Review of Systems Review of Systems  Unable to perform ROS: Mental status change     Physical Exam Updated Vital Signs BP (!) 192/96   Pulse (!) 104   Temp (!) 100.4 F (38 C) (Oral)   Resp 18   Ht '6\' 4"'$  (1.93 m)   Wt 56.2 kg   SpO2 98%   BMI 15.09 kg/m   Physical Exam Vitals signs and nursing note reviewed.  Constitutional:      Appearance: He is well-developed.     Comments: Thin  HENT:     Head: Normocephalic and atraumatic.     Mouth/Throat:     Mouth: Mucous membranes are dry.  Eyes:     General:        Right eye: No discharge.        Left eye: No discharge.     Conjunctiva/sclera: Conjunctivae normal.  Neck:     Musculoskeletal: Normal range of motion and neck supple.  Cardiovascular:     Rate and Rhythm: Regular rhythm. Tachycardia present.     Heart sounds: Normal heart sounds.  Pulmonary:     Effort: Pulmonary effort is normal.     Breath sounds: Normal breath sounds.  Abdominal:     Palpations: Abdomen is soft.     Tenderness: There is no abdominal tenderness. There is no guarding or rebound.  Skin:    General: Skin is warm and dry.     Comments: Dry skin on legs, no signs of infection.   Neurological:     Mental Status: He is alert.     Comments: Patient awake, answers questions by shaking head yes/no but does not verbalize.       ED Treatments / Results  Labs (all labs ordered are listed, but only abnormal results are  displayed) Labs Reviewed  CBC - Abnormal; Notable for the following components:      Result Value   WBC 11.1 (*)    Hemoglobin 12.3 (*)    MCH 24.8 (*)    All other components within normal limits  BASIC METABOLIC PANEL - Abnormal; Notable for the following components:   Sodium 148 (*)    Glucose, Bld 825 (*)    BUN 68 (*)    Creatinine, Ser 3.32 (*)  GFR calc non Af Amer 20 (*)    GFR calc Af Amer 23 (*)    Anion gap 19 (*)    All other components within normal limits  CBG MONITORING, ED - Abnormal; Notable for the following components:   Glucose-Capillary >600 (*)    All other components within normal limits  URINALYSIS, ROUTINE W REFLEX MICROSCOPIC  I-STAT VENOUS BLOOD GAS, ED    EKG EKG Interpretation  Date/Time:  Thursday February 14 2018 12:20:16 EST Ventricular Rate:  103 PR Interval:    QRS Duration: 89 QT Interval:  366 QTC Calculation: 480 R Axis:   65 Text Interpretation:  Sinus tachycardia Right atrial enlargement Left ventricular hypertrophy Anterior Q waves, possibly due to LVH Confirmed by Lennice Sites [id number]) on 02/14/2018 1:03:25 PM   Radiology Dg Chest Portable 1 View  Result Date: 02/14/2018 CLINICAL DATA:  Initial evaluation for acute fever, hyperglycemia. EXAM: PORTABLE CHEST 1 VIEW COMPARISON:  Prior radiograph from 12/23/2017 FINDINGS: Cardiac and mediastinal silhouettes are stable in size and contour, and remain within normal limits. Lungs well inflated. No infiltrates, edema, or pleural effusion. No pneumothorax. Nodular densities overlying the mid lungs bilaterally most consistent with nipple shadows. No acute osseous finding. IMPRESSION: No radiographic evidence for active cardiopulmonary disease. Electronically Signed   By: Jeannine Boga M.D.   On: 02/14/2018 13:17    Procedures Procedures (including critical care time)  Medications Ordered in ED Medications  insulin regular, human (MYXREDLIN) 100 units/ 100 mL infusion (has no  administration in time range)  dextrose 5 %-0.45 % sodium chloride infusion (has no administration in time range)  potassium chloride 10 mEq in 100 mL IVPB (has no administration in time range)  sodium chloride 0.9 % bolus 1,000 mL (0 mLs Intravenous Stopped 02/14/18 1326)  sodium chloride 0.9 % bolus 1,000 mL (0 mLs Intravenous Stopped 02/14/18 1326)     Initial Impression / Assessment and Plan / ED Course  I have reviewed the triage vital signs and the nursing notes.  Pertinent labs & imaging results that were available during my care of the patient were reviewed by me and considered in my medical decision making (see chart for details).     Patient seen and examined. Work-up initiated.  Will need to rule out DKA.  Patient with borderline fever.  Will check UA and chest x-ray based on this.  Vital signs reviewed and are as follows: BP (!) 192/96   Pulse (!) 104   Temp (!) 100.4 F (38 C) (Oral)   Resp 18   Ht '6\' 4"'$  (1.93 m)   Wt 56.2 kg   SpO2 98%   BMI 15.09 kg/m   2:07 PM patient with elevated blood sugars with elevated anion gap.  Sodium corrects to about 160.  Will discuss with admitting team.  Glucose stabilizer protocol initiated.  CRITICAL CARE Performed by: Carlisle Cater PA-C Total critical care time: 35 minutes Critical care time was exclusive of separately billable procedures and treating other patients. Critical care was necessary to treat or prevent imminent or life-threatening deterioration. Critical care was time spent personally by me on the following activities: development of treatment plan with patient and/or surrogate as well as nursing, discussions with consultants, evaluation of patient's response to treatment, examination of patient, obtaining history from patient or surrogate, ordering and performing treatments and interventions, ordering and review of laboratory studies, ordering and review of radiographic studies, pulse oximetry and re-evaluation of  patient's condition.  2:15 PM Spoke with  Dr. Laren Everts who will see patient.   Final Clinical Impressions(s) / ED Diagnoses   Final diagnoses:  Diabetic ketoacidosis without coma associated with other specified diabetes mellitus ([place])  Hypernatremia  Acute kidney injury ([place])   Admit.   ED Discharge Orders    None       Carlisle Cater, PA-C 02/14/18 1603    Lennice Sites, DO 02/14/18 1722

## 2018-02-14 NOTE — ED Notes (Signed)
Josh , PA aware of 825 glucose and gap 19; will await further orders

## 2018-02-14 NOTE — Progress Notes (Signed)
Bl[place], NP notified of CO2 24, Anion gap 10, and BG 148, and requested to advise. Insulin IV gtt still infusing at this time. Will continue to monitor.

## 2018-02-14 NOTE — H&P (Signed)
[facility]                                                                                    Patient Demographics  Dvon Callaway, is a 58 y.o. male  CSN: [id number]  MRN: [id number]  DOB - Sep 14, 1960  Admit Date - 02/14/2018  Outpatient Primary MD for the patient is [name], Audrea Muscat, NP   With History of -  Past Medical History:  Diagnosis Date  . Anxiety   . Anxiety   . Chronic lower back pain   . Depression   . DKA (diabetic ketoacidoses) ([place]) 07/30/2016  . Hyperlipemia   . Hypertension   . Migraine    "last one was ~ 4 yr ago" (12/23/2014)  . Seizures ([place])    "related to pills for anxiety; if I don't take the pills I'm suppose to take I'll have them" (12/23/2014)  . Type II diabetes mellitus ([place])       Past Surgical History:  Procedure Laterality Date  . NO PAST SURGERIES      in for   Chief Complaint  Patient presents with  . Hyperglycemia     HPI  Mike [name]  is a 58 y.o. male, with past medical history significant for diabetes mellitus, with multiple admissions for DKA in the past, history of hyperlipidemia , hypertension and anxiety who was brought by EMS today for decreased responsiveness, and hyperglycemia.  Patient is not compliant with diabetes medications.  In the emergency room the patient was found to be in DKA.  The patient was started on normal saline and glucose stabilizer however he was noted to be hypernatremic and he was switched to half-normal saline.  Patient was noted to have low-grade fever with white blood cell count of 11.1 and his chest x-ray was normal.  Urinalysis was pending.    Review of Systems    Unable to obtain  Social History Social History   Tobacco Use  . Smoking status: Current Every Day Smoker    Packs/day: 0.10    Years: 40.00    Pack years: 4.00    Types: Cigarettes  . Smokeless tobacco: Never Used  Substance Use Topics  . Alcohol use: Yes    Alcohol/week: 2.0 standard drinks   Types: 2 Cans of beer per week    Comment: No labs available at time of assessment     Family History Family History  Problem Relation Age of Onset  . Diabetes Mellitus II Mother   . Diabetes Mellitus II Father      Prior to Admission medications   Medication Sig Start Date End Date Taking? Authorizing Provider  blood glucose meter kit and supplies KIT Dispense based on patient and insurance preference. Use up to four times daily as directed. (FOR ICD-9 250.00, 250.01). 12/26/17   Rai, Ripudeep K, MD  blood glucose meter kit and supplies KIT Dispense based on patient and insurance preference. Use up to four times daily as directed. (FOR ICD-9 250.00, 250.01). 01/11/18   Amin, Jeanella Flattery, MD  insulin NPH-regular Human (NOVOLIN 70/30) (70-30) 100 UNIT/ML injection Inject 20 Units into the skin 2 (two) times daily with  a meal. Patient not taking: Reported on 02/09/2018 01/11/18 02/10/18  Damita Lack, MD  Insulin Pen Needle 31G X 6 MM MISC 1 Device by Does not apply route 2 (two) times daily. 12/26/17   Rai, Ripudeep K, MD  lisinopril (PRINIVIL,ZESTRIL) 20 MG tablet Take 1 tablet (20 mg total) by mouth daily. Patient not taking: Reported on 02/09/2018 12/26/17   Rai, Vernelle Emerald, MD  metoprolol tartrate (LOPRESSOR) 25 MG tablet Take 0.5 tablets (12.5 mg total) by mouth 2 (two) times daily. Patient not taking: Reported on 02/09/2018 12/26/17   Mendel Corning, MD    Allergies  Allergen Reactions  . Ibuprofen Nausea And Vomiting and Other (See Comments)    Makes the patient feel bloated also  . Tylenol [Acetaminophen] Nausea And Vomiting and Other (See Comments)    Makes the patient feel bloated also    Physical Exam  Vitals  Blood pressure (!) 192/96, pulse (!) 104, temperature 99.9 F (37.7 C), temperature source Oral, resp. rate 18, height '6\' 4"'$  (1.93 m), weight 56.2 kg, SpO2 98 %.   1. General poor hygiene, well-developed, drowsy  2.  Flat affect and insight, Not  Suicidal or Homicidal, drowsy and confused.  3. No F.N deficits, grossly, patient moving all extremities.  4. Ears and Eyes appear Normal, Conjunctivae clear, PERRLA. Moist Oral Mucosa.  5. Supple Neck, No JVD, No cervical lymphadenopathy appriciated, No Carotid Bruits.  6. Symmetrical Chest wall movement, decreased breath sounds at the bases.  7. RRR, No Gallops, Rubs or Murmurs, No Parasternal Heave.  8. Positive Bowel Sounds, Abdomen Soft, Non tender, No organomegaly appriciated,No rebound -guarding or rigidity.  9.  No Cyanosis, Normal Skin Turgor, No Skin Rash or Bruise.  10. Good muscle tone,  joints appear normal , no effusions, Normal ROM.    Data Review  CBC Recent Labs  Lab 02/09/18 2014 02/14/18 1233 02/14/18 1417  WBC 9.2 11.1*  --   HGB 9.3* 12.3* 11.9*  HCT 30.3* 40.5 35.0*  PLT 376 356  --   MCV 80.4 81.7  --   MCH 24.7* 24.8*  --   MCHC 30.7 30.4  --   RDW 12.1 12.9  --   LYMPHSABS 1.8  --   --   MONOABS 0.6  --   --   EOSABS 0.2  --   --   BASOSABS 0.0  --   --    ------------------------------------------------------------------------------------------------------------------  Chemistries  Recent Labs  Lab 02/09/18 2014 02/11/18 1953 02/14/18 1233 02/14/18 1417  NA 135 136 148* 148*  K 3.9 3.8 4.7 4.9  CL 94* 95* 105  --   CO2 '30 31 24  '$ --   GLUCOSE 491* 428* 825*  --   BUN 11 17 68*  --   CREATININE 0.96 0.92 3.32*  --   CALCIUM 9.8 9.9 9.6  --   AST 26  --   --   --   ALT 68*  --   --   --   ALKPHOS 99  --   --   --   BILITOT 0.2*  --   --   --    ------------------------------------------------------------------------------------------------------------------ estimated creatinine clearance is 19.7 mL/min (A) (by C-G formula based on SCr of 3.32 mg/dL (H)). ------------------------------------------------------------------------------------------------------------------ No results for input(s): TSH, T4TOTAL, T3FREE, THYROIDAB in  the last 72 hours.  Invalid input(s): FREET3   Coagulation profile No results for input(s): INR, PROTIME in the last 168 hours. ------------------------------------------------------------------------------------------------------------------- No results for input(s):  DDIMER in the last 72 hours. -------------------------------------------------------------------------------------------------------------------  Cardiac Enzymes No results for input(s): CKMB, TROPONINI, MYOGLOBIN in the last 168 hours.  Invalid input(s): CK ------------------------------------------------------------------------------------------------------------------ Invalid input(s): POCBNP   ---------------------------------------------------------------------------------------------------------------  Urinalysis    Component Value Date/Time   COLORURINE YELLOW 02/09/2018 2137   APPEARANCEUR CLEAR 02/09/2018 2137   LABSPEC 1.031 (H) 02/09/2018 2137   PHURINE 5.0 02/09/2018 2137   GLUCOSEU >=500 (A) 02/09/2018 2137   HGBUR MODERATE (A) 02/09/2018 2137   BILIRUBINUR NEGATIVE 02/09/2018 2137   [place] 02/09/2018 2137   PROTEINUR 100 (A) 02/09/2018 2137   UROBILINOGEN 1.0 02/07/2011 1605   NITRITE NEGATIVE 02/09/2018 2137   LEUKOCYTESUR NEGATIVE 02/09/2018 2137    ----------------------------------------------------------------------------------------------------------------   Imaging results:   Dg Chest Portable 1 View  Result Date: 02/14/2018 CLINICAL DATA:  Initial evaluation for acute fever, hyperglycemia. EXAM: PORTABLE CHEST 1 VIEW COMPARISON:  Prior radiograph from 12/23/2017 FINDINGS: Cardiac and mediastinal silhouettes are stable in size and contour, and remain within normal limits. Lungs well inflated. No infiltrates, edema, or pleural effusion. No pneumothorax. Nodular densities overlying the mid lungs bilaterally most consistent with nipple shadows. No acute osseous finding.  IMPRESSION: No radiographic evidence for active cardiopulmonary disease. Electronically Signed   By: Jeannine Boga M.D.   On: 02/14/2018 13:17      Assessment & Plan  Diabetic ketoacidosis , recurrent On the basis of noncompliance  Acute renal failure Probably dehydration, continue with IV fluids  Hypernatremia Continue IV fluids, half-normal saline  Low-grade fever with mild leukocytosis Monitor and start IV antibiotics if needed.  Urinalysis is pending  Hypertension on Zestril and metoprolol, uncontrolled Hold Zestril due to acute renal failure Continue with metoprolol . Monitor and start Norvasc  Altered mental status with history of depression Will monitor to see progress after treatment of DKA  History of hyperlipidemia Not on treatment   DVT Prophylaxis Heparin  AM Labs Ordered, also please review Full Orders    Code Status full  Disposition Plan: Undetermined  Time spent in minutes : 38 minutes  Condition GUARDED   '@SIGNATURE'$ @

## 2018-02-15 LAB — URINALYSIS, ROUTINE W REFLEX MICROSCOPIC
Bilirubin Urine: NEGATIVE
Glucose, UA: >=500 mg/dL — AB
KETONES UR: NEGATIVE mg/dL
Leukocytes, UA: NEGATIVE
Nitrite: NEGATIVE
Protein, ur: 30 mg/dL — AB
Specific Gravity, Urine: 1.021 (ref 1.005–1.030)
pH: 5 (ref 5.0–8.0)

## 2018-02-15 LAB — BASIC METABOLIC PANEL
Anion gap: 10 (ref 5–15)
Anion gap: 11 (ref 5–15)
BUN: 63 mg/dL — ABNORMAL HIGH (ref 6–20)
BUN: 60 mg/dL — ABNORMAL HIGH (ref 6–20)
CALCIUM: 9.2 mg/dL (ref 8.9–10.3)
CHLORIDE: 121 mmol/L — AB (ref 98–111)
CO2: 27 mmol/L (ref 22–32)
CO2: 27 mmol/L (ref 22–32)
Calcium: 9.4 mg/dL (ref 8.9–10.3)
Chloride: 119 mmol/L — ABNORMAL HIGH (ref 98–111)
Creatinine, Ser: 1.89 mg/dL — ABNORMAL HIGH (ref 0.61–1.24)
Creatinine, Ser: 1.72 mg/dL — ABNORMAL HIGH (ref 0.61–1.24)
GFR calc Af Amer: 45 mL/min — ABNORMAL LOW (ref >60)
GFR calc Af Amer: 50 mL/min — ABNORMAL LOW (ref >60)
GFR calc non Af Amer: 39 mL/min — ABNORMAL LOW (ref >60)
GFR calc non Af Amer: 43 mL/min — ABNORMAL LOW (ref >60)
Glucose, Bld: 158 mg/dL — ABNORMAL HIGH (ref 70–99)
Glucose, Bld: 156 mg/dL — ABNORMAL HIGH (ref 70–99)
POTASSIUM: 4 mmol/L (ref 3.5–5.1)
Potassium: 4 mmol/L (ref 3.5–5.1)
Sodium: 158 mmol/L — ABNORMAL HIGH (ref 135–145)
Sodium: 157 mmol/L — ABNORMAL HIGH (ref 135–145)

## 2018-02-15 LAB — GLUCOSE, CAPILLARY
GLUCOSE-CAPILLARY: 272 mg/dL — AB (ref 70–99)
Glucose-Capillary: 139 mg/dL — ABNORMAL HIGH (ref 70–99)
Glucose-Capillary: 152 mg/dL — ABNORMAL HIGH (ref 70–99)
Glucose-Capillary: 153 mg/dL — ABNORMAL HIGH (ref 70–99)
Glucose-Capillary: 159 mg/dL — ABNORMAL HIGH (ref 70–99)
Glucose-Capillary: 160 mg/dL — ABNORMAL HIGH (ref 70–99)
Glucose-Capillary: 166 mg/dL — ABNORMAL HIGH (ref 70–99)
Glucose-Capillary: 179 mg/dL — ABNORMAL HIGH (ref 70–99)
Glucose-Capillary: 181 mg/dL — ABNORMAL HIGH (ref 70–99)
Glucose-Capillary: 183 mg/dL — ABNORMAL HIGH (ref 70–99)
Glucose-Capillary: 239 mg/dL — ABNORMAL HIGH (ref 70–99)
Glucose-Capillary: 277 mg/dL — ABNORMAL HIGH (ref 70–99)

## 2018-02-15 MED ORDER — INSULIN GLARGINE 100 UNIT/ML ~~LOC~~ SOLN
10.0000 [IU] | Freq: Every day | SUBCUTANEOUS | Status: DC
  Administered 2018-02-15: 10 [IU] via SUBCUTANEOUS
  Filled 2018-02-15: qty 0.1

## 2018-02-15 MED ORDER — INSULIN GLARGINE 100 UNIT/ML ~~LOC~~ SOLN
15.0000 [IU] | Freq: Every day | SUBCUTANEOUS | Status: DC
  Administered 2018-02-15 – 2018-02-16 (×2): 15 [IU] via SUBCUTANEOUS
  Filled 2018-02-15 (×2): qty 0.15

## 2018-02-15 MED ORDER — DEXTROSE 5 % IV SOLN
INTRAVENOUS | Status: AC
  Administered 2018-02-15: 17:00:00 via INTRAVENOUS

## 2018-02-15 MED ORDER — INSULIN ASPART 100 UNIT/ML ~~LOC~~ SOLN
0.0000 [IU] | SUBCUTANEOUS | Status: DC
  Administered 2018-02-15: 2 [IU] via SUBCUTANEOUS
  Administered 2018-02-15: 5 [IU] via SUBCUTANEOUS
  Administered 2018-02-15: 2 [IU] via SUBCUTANEOUS
  Administered 2018-02-15: 5 [IU] via SUBCUTANEOUS
  Administered 2018-02-16: 3 [IU] via SUBCUTANEOUS
  Administered 2018-02-16: 7 [IU] via SUBCUTANEOUS
  Administered 2018-02-16 (×2): 3 [IU] via SUBCUTANEOUS
  Administered 2018-02-16: 2 [IU] via SUBCUTANEOUS
  Administered 2018-02-16 – 2018-02-17 (×2): 5 [IU] via SUBCUTANEOUS
  Administered 2018-02-17 (×2): 2 [IU] via SUBCUTANEOUS
  Administered 2018-02-17 (×3): 3 [IU] via SUBCUTANEOUS
  Administered 2018-02-18 (×2): 1 [IU] via SUBCUTANEOUS
  Administered 2018-02-18: 2 [IU] via SUBCUTANEOUS
  Administered 2018-02-18 – 2018-02-19 (×2): 1 [IU] via SUBCUTANEOUS
  Administered 2018-02-19: 2 [IU] via SUBCUTANEOUS

## 2018-02-15 NOTE — Progress Notes (Signed)
PROGRESS NOTE        PATIENT DETAILS Name: Mike [name] Age: 58 y.o. Sex: male Date of Birth: 1960-06-16 Admit Date: 02/14/2018 Admitting Physician Carron CurieAli Hijazi, MD ZOX:[id number]:[name], Chales AbrahamsMary Ann, NP  Brief Narrative: Patient is a 58 y.o. male with history of DM-2-with prior hospitalizations for DKA-brought in for evaluation of altered mental status-found to have acute metabolic encephalopathy in the setting of DKA.  See below for further details  Subjective: Appears somewhat slow-but awake-alert-answers most of my questions appropriately.  Assessment/Plan: DKA: Resolved-off insulin infusion-has been transitioned to subcutaneous insulin.  Insulin-dependent DM-2 with hyperglycemia: Suspect medication noncompliance, most recent A1c in December was 10.7.  Increase Lantus to 15 units nightly, continue SSI and follow CBGs.  Acute metabolic encephalopathy: Secondary to DKA, AKI and hypernatremia-although slow-improving-and not yet back to baseline.  AKI: Likely hemodynamically mediated in the setting of DKA-creatinine improving with supportive care.  Hypernatremia: Suspect likely to dehydration in the setting of profound hypoglycemia-change IV fluids to D5W-recheck electrolytes tomorrow.  Hypertension: Controlled-continue metoprolol-continue to hold lisinopril given AKI.  Medication noncompliance: Counseled extensively.  Deconditioning/debility: PT evaluation pending  DVT Prophylaxis: Prophylactic Heparin  Code Status: Full code  Family Communication: None at bedside  Disposition Plan: Remain inpatient  Antimicrobial agents: Anti-infectives (From admission, onward)   None      Procedures: None  CONSULTS:  None  Time spent: 25- minutes-Greater than 50% of this time was spent in counseling, explanation of diagnosis, planning of further management, and coordination of care.  MEDICATIONS: Scheduled Meds: . heparin  5,000 Units Subcutaneous Q8H    . insulin aspart  0-9 Units Subcutaneous Q4H  . insulin glargine  10 Units Subcutaneous QHS  . mouth rinse  15 mL Mouth Rinse BID  . metoprolol tartrate  12.5 mg Oral BID   Continuous Infusions: . dextrose     PRN Meds:.albuterol, hydrALAZINE, ondansetron **OR** ondansetron (ZOFRAN) IV   PHYSICAL EXAM: Vital signs: Vitals:   02/14/18 2100 02/14/18 2257 02/15/18 0532 02/15/18 0818  BP: (!) 153/81 139/69 (!) 152/84   Pulse: 89 93 77   Resp: 14 20 18    Temp: 98.7 F (37.1 C)  98.7 F (37.1 C) 98.8 F (37.1 C)  TempSrc: Oral  Oral Oral  SpO2: 98% 100% 95%   Weight:      Height:       Filed Weights   02/14/18 1216  Weight: 56.2 kg   Body mass index is 15.09 kg/m.   General appearance :Awake, alert, not in any distress.  HEENT: Atraumatic and Normocephalic Neck: supple Resp:Good air entry bilaterally, no added sounds  CVS: S1 S2 regular, no murmurs.  GI: Bowel sounds present, Non tender and not distended with no gaurding, rigidity or rebound.No organomegaly Extremities: B/L Lower Ext shows no edema, both legs are warm to touch Neurology: Nonfocal but with some generalized weakness. Musculoskeletal:No digital cyanosis Skin:No Rash, warm and dry Wounds:N/A  I have personally reviewed following labs and imaging studies  LABORATORY DATA: CBC: Recent Labs  Lab 02/09/18 2014 02/14/18 1233 02/14/18 1417  WBC 9.2 11.1*  --   NEUTROABS 6.5  --   --   HGB 9.3* 12.3* 11.9*  HCT 30.3* 40.5 35.0*  MCV 80.4 81.7  --   PLT 376 356  --     Basic Metabolic Panel: Recent Labs  Lab 02/11/18 1953 02/14/18 1233  02/14/18 1417 02/14/18 2032 02/15/18 0201 02/15/18 0706  NA 136 148* 148* 153* 158* 157*  K 3.8 4.7 4.9 4.4 4.0 4.0  CL 95* 105  --  119* 121* 119*  CO2 31 24  --  24 27 27   GLUCOSE 428* 825*  --  216* 158* 156*  BUN 17 68*  --  68* 63* 60*  CREATININE 0.92 3.32*  --  2.35* 1.89* 1.72*  CALCIUM 9.9 9.6  --  9.3 9.2 9.4    GFR: Estimated Creatinine  Clearance: 38.1 mL/min (A) (by C-G formula based on SCr of 1.72 mg/dL (H)).  Liver Function Tests: Recent Labs  Lab 02/09/18 2014  AST 26  ALT 68*  ALKPHOS 99  BILITOT 0.2*  PROT 7.2  ALBUMIN 3.5   No results for input(s): LIPASE, AMYLASE in the last 168 hours. No results for input(s): AMMONIA in the last 168 hours.  Coagulation Profile: No results for input(s): INR, PROTIME in the last 168 hours.  Cardiac Enzymes: No results for input(s): CKTOTAL, CKMB, CKMBINDEX, TROPONINI in the last 168 hours.  BNP (last 3 results) No results for input(s): PROBNP in the last 8760 hours.  HbA1C: No results for input(s): HGBA1C in the last 72 hours.  CBG: Recent Labs  Lab 02/15/18 0410 02/15/18 0508 02/15/18 0605 02/15/18 0757 02/15/18 1150  GLUCAP 179* 183* 153* 159* 166*    Lipid Profile: No results for input(s): CHOL, HDL, LDLCALC, TRIG, CHOLHDL, LDLDIRECT in the last 72 hours.  Thyroid Function Tests: No results for input(s): TSH, T4TOTAL, FREET4, T3FREE, THYROIDAB in the last 72 hours.  Anemia Panel: No results for input(s): VITAMINB12, FOLATE, FERRITIN, TIBC, IRON, RETICCTPCT in the last 72 hours.  Urine analysis:    Component Value Date/Time   COLORURINE YELLOW 02/15/2018 0510   APPEARANCEUR HAZY (A) 02/15/2018 0510   LABSPEC 1.021 02/15/2018 0510   PHURINE 5.0 02/15/2018 0510   GLUCOSEU >=500 (A) 02/15/2018 0510   HGBUR SMALL (A) 02/15/2018 0510   BILIRUBINUR NEGATIVE 02/15/2018 0510   KETONESUR NEGATIVE 02/15/2018 0510   PROTEINUR 30 (A) 02/15/2018 0510   UROBILINOGEN 1.0 02/07/2011 1605   NITRITE NEGATIVE 02/15/2018 0510   LEUKOCYTESUR NEGATIVE 02/15/2018 0510    Sepsis Labs: Lactic Acid, Venous    Component Value Date/Time   LATICACIDVEN 3.17 (HH) 12/23/2017 0642    MICROBIOLOGY: Recent Results (from the past 240 hour(s))  MRSA PCR Screening     Status: None   Collection Time: 02/14/18  5:27 PM  Result Value Ref Range Status   MRSA by PCR  NEGATIVE NEGATIVE Final    Comment:        The GeneXpert MRSA Assay (FDA approved for NASAL specimens only), is one component of a comprehensive MRSA colonization surveillance program. It is not intended to diagnose MRSA infection nor to guide or monitor treatment for MRSA infections. Performed at [facility], [address], [place], [place] [address]     RADIOLOGY STUDIES/RESULTS: Dg Chest Portable 1 View  Result Date: 02/14/2018 CLINICAL DATA:  Initial evaluation for acute fever, hyperglycemia. EXAM: PORTABLE CHEST 1 VIEW COMPARISON:  Prior radiograph from 12/23/2017 FINDINGS: Cardiac and mediastinal silhouettes are stable in size and contour, and remain within normal limits. Lungs well inflated. No infiltrates, edema, or pleural effusion. No pneumothorax. Nodular densities overlying the mid lungs bilaterally most consistent with nipple shadows. No acute osseous finding. IMPRESSION: No radiographic evidence for active cardiopulmonary disease. Electronically Signed   By: Janell Quiet.D.  On: 02/14/2018 13:17     LOS: 1 day   Jeoffrey Massed, MD  [facility]  If 7PM-7AM, please contact night-coverage  Please page via [email or web address] [email or web address]-click on MD name and type text message  02/15/2018, 1:46 PM

## 2018-02-15 NOTE — Progress Notes (Signed)
Inpatient Diabetes Program Recommendations  AACE/ADA: New Consensus Statement on Inpatient Glycemic Control (2015)  Target Ranges:  Prepandial:   less than 140 mg/dL      Peak postprandial:   less than 180 mg/dL (1-2 hours)      Critically ill patients:  140 - 180 mg/dL   Lab Results  Component Value Date   GLUCAP 159 (H) 02/15/2018   HGBA1C 10.7 (H) 12/25/2017    Review of Glycemic Control  Diabetes history: type 1 diabetes? Outpatient Diabetes medications: Novolin 70/30 20 units BID Current orders for Inpatient glycemic control: Lantus 10 units daily, Novolog SENSITIVE correction scale every 4 hours.  Inpatient Diabetes Program Recommendations:    Noted that patient has had numerous admissions and has been seen by the glycemic team in the past. Recommend increasing Lantus to 20 units daily or change insulin orders to Novolog 70/30 insulin 20 units BID if patient is eating. Is followed at [facility]. Will continue to monitor blood sugars while in the hospital.  Smith Mince RN BSN CDE Diabetes Coordinator Pager: [phone]  8am-5pm

## 2018-02-15 NOTE — Progress Notes (Signed)
Patient's bed was wet with yellow fluid about torso area. This RN cleaned patient. Patient has condom cath still intact, and had formed BM. This RN asked patient if he had vomitted. Patient stated no. Patient was turned on side for RN to change bed linen. Patient leaned over bed side rail and vomitted onto floor.  Patient encouraged, again, to notify staff if he needs to have a BM, is nauseous, or vomits. At beginning of shift, patient stated that he knows when he needs to urinate or have a BM but has only been incontinent. At times when patient is asked questions, he just looks at this RN and doesn't answer. Patient had a $20 bill and $10 bill in bed with him, found on the floor once. This RN asked patient if money needed to be placed in safe. Patient stated no. This RN showed patient that this and hotel room key was placed in brown paper bag and placed in closet. Will continue to monitor.

## 2018-02-15 NOTE — Progress Notes (Signed)
Blount, NP notified of need for orders for BMP Q4H, BG checks Q1H, and DKA protocol d/t Insulin gtt. Only order for BMP entered. BG still being checked Q1H per protocol. Will continue to monitor.

## 2018-02-15 NOTE — Progress Notes (Signed)
CSW and RNCM spoke with patient. He reported that he will be staying with his baby's mother Suzette Battiest [phone]). He states that he gets his medications at the [facility]. He reports that he will not have a ride home at discharge. However, he is unable to give CSW an address for a taxi voucher. He gave CSW permission to contact [place].   Osborne Casco Gloria Lambertson [name] [phone]

## 2018-02-15 NOTE — Progress Notes (Signed)
Physical Therapy Evaluation Patient Details Name: Mike [name] MRN: [id number] DOB: 10-31-1960 Today's Date: 02/15/2018   History of Present Illness  Patient is a 58 y/o male presenting to hospital with unresponsiveness and hyperglycemia secondary to DKA. Patient has had multiple hospital visits due to similar problems. PMH inlcudes HTN, DMII, HLD, anxiety, depression, and seizures.   Clinical Impression  Patient admitted to hospital secondary to problems above and with deficits below. Patient required minA-modA for ambulation with and without RW secondary to multiple LOB. Unsure of patient's baseline mobility and cognitive status as patient is poor historian. Patient is at a very high fall risk due to limitations in functional mobility and poor safety awareness. Unsure if patient will have assistance at discharge, and feel patient would benefit from SNF level therapy at this time given current mobility deficits. However, unsure if patient will qualify for follow up services. If patient unable to get follow up services, patient will need RW to increase safety with mobility. Patient would benefit from acute physical therapy services to maximize independence and safety with functional mobility.     Follow Up Recommendations SNF;Supervision/Assistance - 24 hour(unsure what pt will be eligible for )    Equipment Recommendations  Rolling walker with 5" wheels    Recommendations for Other Services       Precautions / Restrictions Precautions Precautions: Fall Restrictions Weight Bearing Restrictions: No      Mobility  Bed Mobility Overal bed mobility: Needs Assistance Bed Mobility: Supine to Sit     Supine to sit: Supervision     General bed mobility comments: Patient required supervision for all bed mobility for safety   Transfers Overall transfer level: Needs assistance Equipment used: Rolling walker (2 wheeled);None Transfers: Sit to/from Stand Sit to Stand: Min guard          General transfer comment: Patient required min guard to stand x2. 1st trial without AD and 2nd with RW for safety.   Ambulation/Gait Ambulation/Gait assistance: Min assist;Mod assist Gait Distance (Feet): 75 [id number]) Assistive device: Rolling walker (2 wheeled);None Gait Pattern/deviations: Step-through pattern;Decreased step length - left;Decreased step length - right;Drifts right/left;Staggering left;Staggering right Gait velocity: decreased Gait velocity interpretation: <1.8 ft/sec, indicate of risk for recurrent falls General Gait Details: Attempted ambulation without AD but patient very unsteady requring min-modA secondary to multiple LOB. Improved stability with ambulation using RW requring minA for steadying. Verbal cues for sequencing with RW and for proximity to AD. Patient unaware of deficits and refusing RW initally, however patient eventually was agreeable.   Stairs            Wheelchair Mobility    Modified Rankin (Stroke Patients Only)       Balance Overall balance assessment: Needs assistance Sitting-balance support: Feet supported;Bilateral upper extremity supported Sitting balance-Leahy Scale: Fair Sitting balance - Comments: Patient had no LOB while sitting EOB   Standing balance support: Bilateral upper extremity supported Standing balance-Leahy Scale: Poor Standing balance comment: reliant on BUE support with use of RW and external assistance to maintain standing balance                             Pertinent Vitals/Pain Pain Assessment: Faces Faces Pain Scale: No hurt    Home Living Family/patient expects to be discharged to:: Private residence Living Arrangements: Spouse/significant other("baby mother") Available Help at Discharge: Family;Available PRN/intermittently Type of Home: Apartment Home Access: Level entry     Home Layout: One level  Home Equipment: Shower seat - built in Additional Comments: Patient reports he will  be living with girlfriend when he leaves hospital. Per CSW, patient will be unable to stay with girlfriend so unsure where patient will be discharging. Per previous notes, patient is homeless    Prior Function Level of Independence: Independent         Comments: Reports he does not use AD     Hand Dominance        Extremity/Trunk Assessment   Upper Extremity Assessment Upper Extremity Assessment: Overall WFL for tasks assessed    Lower Extremity Assessment Lower Extremity Assessment: Generalized weakness    Cervical / Trunk Assessment Cervical / Trunk Assessment: Normal  Communication   Communication: Other (comment);Expressive difficulties(Patient sometimes not responding to questions)  Cognition Arousal/Alertness: Awake/alert Behavior During Therapy: Flat affect Overall Cognitive Status: No family/caregiver present to determine baseline cognitive functioning Area of Impairment: Orientation;Attention;Memory;Following commands;Safety/judgement;Awareness;Problem solving                 Orientation Level: Disoriented to;Place;Time Current Attention Level: Sustained Memory: Decreased short-term memory Following Commands: Follows one step commands inconsistently;Follows one step commands with increased time Safety/Judgement: Decreased awareness of safety;Decreased awareness of deficits Awareness: Emergent Problem Solving: Slow processing;Decreased initiation;Difficulty sequencing General Comments: Unsure of patient's baseline cognition. A&Ox2. Patient with very poor safety awarness and wanting to walk without AD. Patient with increased time for processing and responding to cues.       General Comments General comments (skin integrity, edema, etc.): Unsure of patient's baseline mobility status as patient is poor historian.     Exercises     Assessment/Plan    PT Assessment Patient needs continued PT services  PT Problem List Decreased strength;Decreased range of  motion;Decreased activity tolerance;Decreased balance;Decreased mobility;Decreased cognition;Decreased knowledge of use of DME;Decreased safety awareness;Decreased knowledge of precautions       PT Treatment Interventions DME instruction;Gait training;Stair training;Therapeutic activities;Functional mobility training;Therapeutic exercise;Balance training;Cognitive remediation;Patient/family education    PT Goals (Current goals can be found in the Care Plan section)  Acute Rehab PT Goals PT Goal Formulation: Patient unable to participate in goal setting Time For Goal Achievement: 03/01/18 Potential to Achieve Goals: Fair    Frequency Min 2X/week   Barriers to discharge Decreased caregiver support      Co-evaluation               AM-PAC PT "6 Clicks" Mobility  Outcome Measure Help needed turning from your back to your side while in a flat bed without using bedrails?: None Help needed moving from lying on your back to sitting on the side of a flat bed without using bedrails?: None Help needed moving to and from a bed to a chair (including a wheelchair)?: A Little Help needed standing up from a chair using your arms (e.g., wheelchair or bedside chair)?: A Little Help needed to walk in hospital room?: A Lot Help needed climbing 3-5 steps with a railing? : A Lot 6 Click Score: 18    End of Session Equipment Utilized During Treatment: Gait belt Activity Tolerance: Patient tolerated treatment well Patient left: in chair;with call bell/phone within reach;with chair alarm set Nurse Communication: Mobility status PT Visit Diagnosis: Unsteadiness on feet (R26.81);Difficulty in walking, not elsewhere classified (R26.2);Muscle weakness (generalized) (M62.81)    Time: [id number] PT Time Calculation (min) (ACUTE ONLY): 22 min   Charges:   PT Evaluation $PT Eval Moderate Complexity: 1 Mod          Vanessa RalphsAnna Amarya Kuehl,  SPT  Vanessa Ralphs 02/15/2018, 3:04 PM

## 2018-02-15 NOTE — Progress Notes (Signed)
Blount, NP notified of CO2 27, Anion Gap 10, BG 181, and requested to advise about Insulin gtt. Will continue to monitor.

## 2018-02-16 LAB — BASIC METABOLIC PANEL
Anion gap: 9 (ref 5–15)
BUN: 58 mg/dL — ABNORMAL HIGH (ref 6–20)
CHLORIDE: 119 mmol/L — AB (ref 98–111)
CO2: 27 mmol/L (ref 22–32)
Calcium: 9.6 mg/dL (ref 8.9–10.3)
Creatinine, Ser: 1.6 mg/dL — ABNORMAL HIGH (ref 0.61–1.24)
GFR calc Af Amer: 55 mL/min — ABNORMAL LOW (ref >60)
GFR calc non Af Amer: 47 mL/min — ABNORMAL LOW (ref >60)
Glucose, Bld: 194 mg/dL — ABNORMAL HIGH (ref 70–99)
Potassium: 3.8 mmol/L (ref 3.5–5.1)
SODIUM: 155 mmol/L — AB (ref 135–145)

## 2018-02-16 LAB — GLUCOSE, CAPILLARY
GLUCOSE-CAPILLARY: 315 mg/dL — AB (ref 70–99)
Glucose-Capillary: 167 mg/dL — ABNORMAL HIGH (ref 70–99)
Glucose-Capillary: 233 mg/dL — ABNORMAL HIGH (ref 70–99)
Glucose-Capillary: 252 mg/dL — ABNORMAL HIGH (ref 70–99)
Glucose-Capillary: 239 mg/dL — ABNORMAL HIGH (ref 70–99)
Glucose-Capillary: 255 mg/dL — ABNORMAL HIGH (ref 70–99)
Glucose-Capillary: 274 mg/dL — ABNORMAL HIGH (ref 70–99)

## 2018-02-16 MED ORDER — METOPROLOL TARTRATE 25 MG PO TABS
25.0000 mg | ORAL_TABLET | Freq: Two times a day (BID) | ORAL | Status: DC
  Administered 2018-02-16 – 2018-02-19 (×6): 25 mg via ORAL
  Filled 2018-02-16 (×6): qty 1

## 2018-02-16 MED ORDER — DEXTROSE 5 % IV SOLN
INTRAVENOUS | Status: DC
  Administered 2018-02-16 – 2018-02-17 (×3): via INTRAVENOUS

## 2018-02-16 NOTE — Progress Notes (Signed)
PROGRESS NOTE        PATIENT DETAILS Name: Mike [name] Age: 58 y.o. Sex: male Date of Birth: 02-Oct-1960 Admit Date: 02/14/2018 Admitting Physician Carron CurieAli Hijazi, MD WUJ:[id number]:[name], Chales AbrahamsMary Ann, NP  Brief Narrative: Patient is a 58 y.o. male with history of DM-2-with prior hospitalizations for DKA-brought in for evaluation of altered mental status-found to have acute metabolic encephalopathy in the setting of DKA.  See below for further details  Subjective: Sitting at bedside chair-awake-still appears to be somewhat slow to answer some questions.  However answers most of my questions appropriately.  Apparently [place] is from [place]and moved to the [place] when he got married.  Assessment/Plan: DKA: Resolved-off insulin infusion-has been transitioned to subcutaneous insulin.  Insulin-dependent DM-2 with hyperglycemia: Suspect medication noncompliance, most recent A1c in December was 10.7.  CBGs improved-but still above 200 (also on D5 W)-increase Lantus to 20 units, continue SSI and follow.   Acute metabolic encephalopathy: Secondary to DKA, AKI and hypernatremia-he is improving-slightly slow to respond but do not think he is that different from his usual baseline.   AKI: Hemodynamically mediated-in a setting of DKA-creatinine improving with supportive care.  Follow electrolytes.    Hypernatremia: Suspect likely to dehydration in the setting of profound hyperglycemia-continue D5W-now that he is more awake-we will encourage oral intake as well.  Recheck electrolytes tomorrow.   Hypertension: Uncontrolled-increase metoprolol to 25 mg twice daily, continue to hold lisinopril given AKI.    Medication noncompliance: Counseled extensively.  Deconditioning/debility: Secondary to acute illness-PT recommending SNF.  Hopeful that he will continue now that his electrolyte abnormalities are improving rapidly.  DVT Prophylaxis: Prophylactic Heparin  Code Status: Full  code  Family Communication: None at bedside  Disposition Plan: Remain inpatient-home or SNF over the next few days depending on clinical improvement.  Antimicrobial agents: Anti-infectives (From admission, onward)   None      Procedures: None  CONSULTS:  None  Time spent: 25- minutes-Greater than 50% of this time was spent in counseling, explanation of diagnosis, planning of further management, and coordination of care.  MEDICATIONS: Scheduled Meds: . heparin  5,000 Units Subcutaneous Q8H  . insulin aspart  0-9 Units Subcutaneous Q4H  . insulin glargine  15 Units Subcutaneous QHS  . mouth rinse  15 mL Mouth Rinse BID  . metoprolol tartrate  12.5 mg Oral BID   Continuous Infusions: . dextrose 50 mL/hr at 02/16/18 0300   PRN Meds:.albuterol, hydrALAZINE, ondansetron **OR** ondansetron (ZOFRAN) IV   PHYSICAL EXAM: Vital signs: Vitals:   02/15/18 1857 02/15/18 2019 02/16/18 0502 02/16/18 0802  BP: (!) 141/104 (!) 163/97 (!) 165/94 (!) 178/86  Pulse: 85 94 77 87  Resp:  18 16   Temp: 98.9 F (37.2 C) 99.7 F (37.6 C) 99.7 F (37.6 C)   TempSrc: Oral Oral Oral   SpO2: 100% 100% 96%   Weight:      Height:       Filed Weights   02/14/18 1216  Weight: 56.2 kg   Body mass index is 15.09 kg/m.   General appearance:Awake, alert, not in any distress.  Eyes:no scleral icterus. HEENT: Atraumatic and Normocephalic Neck: supple, no JVD. Resp:Good air entry bilaterally,no rales or rhonchi CVS: S1 S2 regular, no murmurs.  GI: Bowel sounds present, Non tender and not distended with no gaurding, rigidity or rebound. Extremities: B/L Lower Ext shows no  edema, both legs are warm to touch Neurology:  Non focal-but with generalized weakness Musculoskeletal:No digital cyanosis Skin:No Rash, warm and dry Wounds:N/A  I have personally reviewed following labs and imaging studies  LABORATORY DATA: CBC: Recent Labs  Lab 02/09/18 2014 02/14/18 1233 02/14/18 1417  WBC  9.2 11.1*  --   NEUTROABS 6.5  --   --   HGB 9.3* 12.3* 11.9*  HCT 30.3* 40.5 35.0*  MCV 80.4 81.7  --   PLT 376 356  --     Basic Metabolic Panel: Recent Labs  Lab 02/14/18 1233 02/14/18 1417 02/14/18 2032 02/15/18 0201 02/15/18 0706 02/16/18 0518  NA 148* 148* 153* 158* 157* 155*  K 4.7 4.9 4.4 4.0 4.0 3.8  CL 105  --  119* 121* 119* 119*  CO2 24  --  24 27 27 27   GLUCOSE 825*  --  216* 158* 156* 194*  BUN 68*  --  68* 63* 60* 58*  CREATININE 3.32*  --  2.35* 1.89* 1.72* 1.60*  CALCIUM 9.6  --  9.3 9.2 9.4 9.6    GFR: Estimated Creatinine Clearance: 41 mL/min (A) (by C-G formula based on SCr of 1.6 mg/dL (H)).  Liver Function Tests: Recent Labs  Lab 02/09/18 2014  AST 26  ALT 68*  ALKPHOS 99  BILITOT 0.2*  PROT 7.2  ALBUMIN 3.5   No results for input(s): LIPASE, AMYLASE in the last 168 hours. No results for input(s): AMMONIA in the last 168 hours.  Coagulation Profile: No results for input(s): INR, PROTIME in the last 168 hours.  Cardiac Enzymes: No results for input(s): CKTOTAL, CKMB, CKMBINDEX, TROPONINI in the last 168 hours.  BNP (last 3 results) No results for input(s): PROBNP in the last 8760 hours.  HbA1C: No results for input(s): HGBA1C in the last 72 hours.  CBG: Recent Labs  Lab 02/15/18 1714 02/15/18 2016 02/15/18 2347 02/16/18 0323 02/16/18 0753  GLUCAP 277* 272* 239* 167* 233*    Lipid Profile: No results for input(s): CHOL, HDL, LDLCALC, TRIG, CHOLHDL, LDLDIRECT in the last 72 hours.  Thyroid Function Tests: No results for input(s): TSH, T4TOTAL, FREET4, T3FREE, THYROIDAB in the last 72 hours.  Anemia Panel: No results for input(s): VITAMINB12, FOLATE, FERRITIN, TIBC, IRON, RETICCTPCT in the last 72 hours.  Urine analysis:    Component Value Date/Time   COLORURINE YELLOW 02/15/2018 0510   APPEARANCEUR HAZY (A) 02/15/2018 0510   LABSPEC 1.021 02/15/2018 0510   PHURINE 5.0 02/15/2018 0510   GLUCOSEU >=500 (A) 02/15/2018  0510   HGBUR SMALL (A) 02/15/2018 0510   BILIRUBINUR NEGATIVE 02/15/2018 0510   KETONESUR NEGATIVE 02/15/2018 0510   PROTEINUR 30 (A) 02/15/2018 0510   UROBILINOGEN 1.0 02/07/2011 1605   NITRITE NEGATIVE 02/15/2018 0510   LEUKOCYTESUR NEGATIVE 02/15/2018 0510    Sepsis Labs: Lactic Acid, Venous    Component Value Date/Time   LATICACIDVEN 3.17 (HH) 12/23/2017 0642    MICROBIOLOGY: Recent Results (from the past 240 hour(s))  MRSA PCR Screening     Status: None   Collection Time: 02/14/18  5:27 PM  Result Value Ref Range Status   MRSA by PCR NEGATIVE NEGATIVE Final    Comment:        The GeneXpert MRSA Assay (FDA approved for NASAL specimens only), is one component of a comprehensive MRSA colonization surveillance program. It is not intended to diagnose MRSA infection nor to guide or monitor treatment for MRSA infections. Performed at [facility], [address] [place], [place] [address]     RADIOLOGY STUDIES/RESULTS: Dg Chest Portable 1 View  Result Date: 02/14/2018 CLINICAL DATA:  Initial evaluation for acute fever, hyperglycemia. EXAM: PORTABLE CHEST 1 VIEW COMPARISON:  Prior radiograph from 12/23/2017 FINDINGS: Cardiac and mediastinal silhouettes are stable in size and contour, and remain within normal limits. Lungs well inflated. No infiltrates, edema, or pleural effusion. No pneumothorax. Nodular densities overlying the mid lungs bilaterally most consistent with nipple shadows. No acute osseous finding. IMPRESSION: No radiographic evidence for active cardiopulmonary disease. Electronically Signed   By: Rise MuBenjamin  McClintock M.D.   On: 02/14/2018 13:17     LOS: 2 days   Jeoffrey MassedShanker , MD  [facility]  If 7PM-7AM, please contact night-coverage  Please page via [email or web address] [email or web address]-click on MD name and type text message  02/16/2018, 9:48 AM

## 2018-02-17 LAB — BASIC METABOLIC PANEL
Anion gap: 10 (ref 5–15)
BUN: 38 mg/dL — ABNORMAL HIGH (ref 6–20)
CO2: 27 mmol/L (ref 22–32)
Calcium: 9.5 mg/dL (ref 8.9–10.3)
Chloride: 112 mmol/L — ABNORMAL HIGH (ref 98–111)
Creatinine, Ser: 1.19 mg/dL (ref 0.61–1.24)
GFR calc Af Amer: >60 mL/min (ref >60)
GFR calc non Af Amer: >60 mL/min (ref >60)
Glucose, Bld: 236 mg/dL — ABNORMAL HIGH (ref 70–99)
Potassium: 3.7 mmol/L (ref 3.5–5.1)
Sodium: 149 mmol/L — ABNORMAL HIGH (ref 135–145)

## 2018-02-17 LAB — GLUCOSE, CAPILLARY
GLUCOSE-CAPILLARY: 221 mg/dL — AB (ref 70–99)
Glucose-Capillary: 161 mg/dL — ABNORMAL HIGH (ref 70–99)
Glucose-Capillary: 173 mg/dL — ABNORMAL HIGH (ref 70–99)
Glucose-Capillary: 221 mg/dL — ABNORMAL HIGH (ref 70–99)
Glucose-Capillary: 238 mg/dL — ABNORMAL HIGH (ref 70–99)
Glucose-Capillary: 263 mg/dL — ABNORMAL HIGH (ref 70–99)

## 2018-02-17 MED ORDER — AMLODIPINE BESYLATE 10 MG PO TABS
10.0000 mg | ORAL_TABLET | Freq: Every day | ORAL | Status: DC
  Administered 2018-02-17 – 2018-02-19 (×3): 10 mg via ORAL
  Filled 2018-02-17 (×3): qty 1

## 2018-02-17 MED ORDER — INSULIN ASPART 100 UNIT/ML ~~LOC~~ SOLN
3.0000 [IU] | Freq: Three times a day (TID) | SUBCUTANEOUS | Status: DC
  Administered 2018-02-17 – 2018-02-19 (×7): 3 [IU] via SUBCUTANEOUS

## 2018-02-17 MED ORDER — INSULIN GLARGINE 100 UNIT/ML ~~LOC~~ SOLN
15.0000 [IU] | Freq: Every day | SUBCUTANEOUS | Status: DC
  Administered 2018-02-17 – 2018-02-19 (×3): 15 [IU] via SUBCUTANEOUS
  Filled 2018-02-17 (×3): qty 0.15

## 2018-02-17 NOTE — Progress Notes (Signed)
Noticed patient's IVF due to end at 1800. Dr. Thedore Mins paged asking if he wants orders to be continued. Awaiting response.

## 2018-02-17 NOTE — Progress Notes (Addendum)
PROGRESS NOTE        PATIENT DETAILS Name: Mike [name] Age: 58 y.o. Sex: male Date of Birth: May 02, 1960 Admit Date: 02/14/2018 Admitting Physician Carron Curie, MD INO:[facility], [name], NP  Brief Narrative: Patient is a 58 y.o. male with history of DM-2-with prior hospitalizations for DKA-brought in for evaluation of altered mental status-found to have acute metabolic encephalopathy in the setting of DKA.  See below for further details  Subjective:  Sitting at bedside chair-awake-still appears to be somewhat slow to answer some questions.  However answers most of my questions appropriately.  Apparently he is from [place] moved to the [place] when he got married.  Assessment/Plan:  DKA in a DM type II patient dehydration and hypernatremia.  Suspect medication noncompliance, most recent A1c in December was 10.7.  Was treated with DKA protocol now DKA has resolved, still is hyponatremic and dehydrated, continue gentle D5 drip and monitor BMP.  Lab Results  Component Value Date   HGBA1C 10.7 (H) 12/25/2017   CBG (last 3)  Recent Labs    02/17/18 0002 02/17/18 0431 02/17/18 0752  GLUCAP 238* 221* 263*     Acute metabolic encephalopathy: Secondary to DKA, AKI and hypernatremia-he is improving-slightly slow to respond but do not think he is that different from his usual baseline.   AKI: Hemodynamically mediated-in a setting of DKA-creatinine improving with supportive care.  Follow electrolytes.    Hypernatremia: #1 above.   Hypertension: Controlled on beta-blocker will add Norvasc and monitor.    Medication noncompliance: Counseled extensively.  Deconditioning/debility: Secondary to acute illness-PT recommending SNF.   Social work consulted.   DVT Prophylaxis: Prophylactic Heparin  Code Status: Full code  Family Communication: None at bedside  Disposition Plan: Remain inpatient-home or SNF over the next few days depending on clinical  improvement.  Antimicrobial agents: Anti-infectives (From admission, onward)   None      Procedures: None  CONSULTS:  None  Time spent: 25- minutes-Greater than 50% of this time was spent in counseling, explanation of diagnosis, planning of further management, and coordination of care.  MEDICATIONS: Scheduled Meds: . heparin  5,000 Units Subcutaneous Q8H  . insulin aspart  0-9 Units Subcutaneous Q4H  . insulin aspart  3 Units Subcutaneous TID WC  . insulin glargine  15 Units Subcutaneous Daily  . mouth rinse  15 mL Mouth Rinse BID  . metoprolol tartrate  25 mg Oral BID   Continuous Infusions: . dextrose 100 mL/hr at 02/17/18 0645   PRN Meds:.albuterol, hydrALAZINE, ondansetron **OR** ondansetron (ZOFRAN) IV   PHYSICAL EXAM: Vital signs: Vitals:   02/17/18 0431 02/17/18 0505 02/17/18 0553 02/17/18 0818  BP: (!) 164/94 (!) 175/85 (!) 144/78 (!) 174/92  Pulse: (!) 58  62 69  Resp: 18     Temp: 98.3 F (36.8 C)     TempSrc: Oral     SpO2: 100%     Weight:      Height:       Filed Weights   02/14/18 1216  Weight: 56.2 kg   Body mass index is 15.09 kg/m.   Exam  Awake Alert, Oriented X 3, No new F.N deficits, Flat affect [place].AT,PERRAL Supple Neck,No JVD, No cervical lymphadenopathy appriciated.  Symmetrical Chest wall movement, Good air movement bilaterally, CTAB RRR,No Gallops, Rubs or new Murmurs, No Parasternal Heave +ve B.Sounds, Abd Soft, No tenderness,  No organomegaly appriciated, No rebound - guarding or rigidity. No Cyanosis, Clubbing or edema, No new Rash or bruise   I have personally reviewed following labs and imaging studies  LABORATORY DATA: CBC: Recent Labs  Lab 02/14/18 1233 02/14/18 1417  WBC 11.1*  --   HGB 12.3* 11.9*  HCT 40.5 35.0*  MCV 81.7  --   PLT 356  --     Basic Metabolic Panel: Recent Labs  Lab 02/14/18 2032 02/15/18 0201 02/15/18 0706 02/16/18 0518 02/17/18 0351  NA 153* 158* 157* 155* 149*  K 4.4 4.0 4.0  3.8 3.7  CL 119* 121* 119* 119* 112*  CO2 24 27 27 27 27   GLUCOSE 216* 158* 156* 194* 236*  BUN 68* 63* 60* 58* 38*  CREATININE 2.35* 1.89* 1.72* 1.60* 1.19  CALCIUM 9.3 9.2 9.4 9.6 9.5    GFR: Estimated Creatinine Clearance: 55.1 mL/min (by C-G formula based on SCr of 1.19 mg/dL).  Liver Function Tests: No results for input(s): AST, ALT, ALKPHOS, BILITOT, PROT, ALBUMIN in the last 168 hours. No results for input(s): LIPASE, AMYLASE in the last 168 hours. No results for input(s): AMMONIA in the last 168 hours.  Coagulation Profile: No results for input(s): INR, PROTIME in the last 168 hours.  Cardiac Enzymes: No results for input(s): CKTOTAL, CKMB, CKMBINDEX, TROPONINI in the last 168 hours.  BNP (last 3 results) No results for input(s): PROBNP in the last 8760 hours.  HbA1C: No results for input(s): HGBA1C in the last 72 hours.  CBG: Recent Labs  Lab 02/16/18 2017 02/16/18 2134 02/17/18 0002 02/17/18 0431 02/17/18 0752  GLUCAP 255* 274* 238* 221* 263*    Lipid Profile: No results for input(s): CHOL, HDL, LDLCALC, TRIG, CHOLHDL, LDLDIRECT in the last 72 hours.  Thyroid Function Tests: No results for input(s): TSH, T4TOTAL, FREET4, T3FREE, THYROIDAB in the last 72 hours.  Anemia Panel: No results for input(s): VITAMINB12, FOLATE, FERRITIN, TIBC, IRON, RETICCTPCT in the last 72 hours.  Urine analysis:    Component Value Date/Time   COLORURINE YELLOW 02/15/2018 0510   APPEARANCEUR HAZY (A) 02/15/2018 0510   LABSPEC 1.021 02/15/2018 0510   PHURINE 5.0 02/15/2018 0510   GLUCOSEU >=500 (A) 02/15/2018 0510   HGBUR SMALL (A) 02/15/2018 0510   BILIRUBINUR NEGATIVE 02/15/2018 0510   KETONESUR NEGATIVE 02/15/2018 0510   PROTEINUR 30 (A) 02/15/2018 0510   UROBILINOGEN 1.0 02/07/2011 1605   NITRITE NEGATIVE 02/15/2018 0510   LEUKOCYTESUR NEGATIVE 02/15/2018 0510    Sepsis Labs: Lactic Acid, Venous    Component Value Date/Time   LATICACIDVEN 3.17 (HH)  12/23/2017 0642    MICROBIOLOGY: Recent Results (from the past 240 hour(s))  MRSA PCR Screening     Status: None   Collection Time: 02/14/18  5:27 PM  Result Value Ref Range Status   MRSA by PCR NEGATIVE NEGATIVE Final    Comment:        The GeneXpert MRSA Assay (FDA approved for NASAL specimens only), is one component of a comprehensive MRSA colonization surveillance program. It is not intended to diagnose MRSA infection nor to guide or monitor treatment for MRSA infections. Performed at [facility], [address] [place], [place] [address]     RADIOLOGY STUDIES/RESULTS: Dg Chest Portable 1 View  Result Date: 02/14/2018 CLINICAL DATA:  Initial evaluation for acute fever, hyperglycemia. EXAM: PORTABLE CHEST 1 VIEW COMPARISON:  Prior radiograph from 12/23/2017 FINDINGS: Cardiac and mediastinal silhouettes are stable in size and contour, and remain within normal  limits. Lungs well inflated. No infiltrates, edema, or pleural effusion. No pneumothorax. Nodular densities overlying the mid lungs bilaterally most consistent with nipple shadows. No acute osseous finding. IMPRESSION: No radiographic evidence for active cardiopulmonary disease. Electronically Signed   By: Rise Mu M.D.   On: 02/14/2018 13:17     LOS: 3 days   Signature  Susa Raring M.D on 02/17/2018 at 10:10 AM   -  To page go to [email or web address]

## 2018-02-18 DIAGNOSIS — E0810 Diabetes mellitus due to underlying condition with ketoacidosis without coma: Secondary | ICD-10-CM

## 2018-02-18 DIAGNOSIS — E1310 Other specified diabetes mellitus with ketoacidosis without coma: Secondary | ICD-10-CM

## 2018-02-18 DIAGNOSIS — N179 Acute kidney failure, unspecified: Secondary | ICD-10-CM

## 2018-02-18 DIAGNOSIS — E870 Hyperosmolality and hypernatremia: Secondary | ICD-10-CM

## 2018-02-18 LAB — BASIC METABOLIC PANEL
Anion gap: 12 (ref 5–15)
BUN: 25 mg/dL — ABNORMAL HIGH (ref 6–20)
CO2: 24 mmol/L (ref 22–32)
Calcium: 9.1 mg/dL (ref 8.9–10.3)
Chloride: 110 mmol/L (ref 98–111)
Creatinine, Ser: 1.02 mg/dL (ref 0.61–1.24)
GFR calc non Af Amer: >60 mL/min (ref >60)
Glucose, Bld: 151 mg/dL — ABNORMAL HIGH (ref 70–99)
Potassium: 3.3 mmol/L — ABNORMAL LOW (ref 3.5–5.1)
Sodium: 146 mmol/L — ABNORMAL HIGH (ref 135–145)

## 2018-02-18 LAB — GLUCOSE, CAPILLARY
GLUCOSE-CAPILLARY: 137 mg/dL — AB (ref 70–99)
Glucose-Capillary: 112 mg/dL — ABNORMAL HIGH (ref 70–99)
Glucose-Capillary: 118 mg/dL — ABNORMAL HIGH (ref 70–99)
Glucose-Capillary: 126 mg/dL — ABNORMAL HIGH (ref 70–99)
Glucose-Capillary: 131 mg/dL — ABNORMAL HIGH (ref 70–99)
Glucose-Capillary: 153 mg/dL — ABNORMAL HIGH (ref 70–99)

## 2018-02-18 LAB — MAGNESIUM: Magnesium: 2.1 mg/dL (ref 1.7–2.4)

## 2018-02-18 MED ORDER — LISINOPRIL 5 MG PO TABS: 5.0000 mg | ORAL_TABLET | Freq: Every day | ORAL | 0 refills | Status: AC

## 2018-02-18 MED ORDER — ENALAPRILAT 1.25 MG/ML IV SOLN
1.2500 mg | Freq: Once | INTRAVENOUS | Status: AC
  Administered 2018-02-18: 1.25 mg via INTRAVENOUS
  Filled 2018-02-18: qty 1

## 2018-02-18 MED ORDER — INSULIN PEN NEEDLE 31G X 6 MM MISC: 1.0000 | Freq: Two times a day (BID) | 0 refills | Status: AC

## 2018-02-18 MED ORDER — ENSURE ENLIVE PO LIQD
237.0000 mL | Freq: Three times a day (TID) | ORAL | Status: DC
  Administered 2018-02-18 – 2018-02-19 (×3): 237 mL via ORAL

## 2018-02-18 MED ORDER — METOPROLOL TARTRATE 25 MG PO TABS: 25.0000 mg | ORAL_TABLET | Freq: Two times a day (BID) | ORAL | 0 refills | Status: AC

## 2018-02-18 MED ORDER — POTASSIUM CHLORIDE CRYS ER 20 MEQ PO TBCR
40.0000 meq | EXTENDED_RELEASE_TABLET | Freq: Once | ORAL | Status: AC
  Administered 2018-02-18: 40 meq via ORAL
  Filled 2018-02-18: qty 2

## 2018-02-18 MED ORDER — BLOOD GLUCOSE MONITOR KIT: PACK | 0 refills | Status: AC

## 2018-02-18 MED ORDER — INSULIN NPH ISOPHANE & REGULAR (70-30) 100 UNIT/ML ~~LOC~~ SUSP: 20.0000 [IU] | Freq: Two times a day (BID) | SUBCUTANEOUS | 0 refills | Status: AC

## 2018-02-18 MED FILL — NOVOLIN 70/30 100 UNITS/ML: (70-30) 100 | 25 days supply | Qty: 10 | Fill #0

## 2018-02-18 MED FILL — TRUEplus LANCETS 28G MISC: 25 days supply | Qty: 100 | Fill #0

## 2018-02-18 MED FILL — METOPROLOL TARTRATE 25 MG T: 25 | 30 days supply | Qty: 60 | Fill #0

## 2018-02-18 MED FILL — LISINOPRIL 5 MG TAB: 5 | 30 days supply | Qty: 30 | Fill #0

## 2018-02-18 MED FILL — !TRUE METRIX BLOOD GLUCOSE: 1 days supply | Qty: 1 | Fill #0

## 2018-02-18 MED FILL — TRUE METRIX TEST STRIP: 25 days supply | Qty: 100 | Fill #0

## 2018-02-18 NOTE — Progress Notes (Signed)
CSW contacted [facility]. They do not have beds available today.   CSW contacted [facility]. They state that their beds are first come, first serve, and would not save a bed for patient. CSW waiting to get patient's medications arranged to see if we can send patient there by taxi. RN walked patient down the hall and RNCM provided a walker.   Osborne Casco Shakyla Nolley [name] [phone]

## 2018-02-18 NOTE — Discharge Summary (Signed)
Mike [name]:[id number] DOB: August 07, 1960 DOA: 02/14/2018  PCP: [name], NP  Admit date: 02/14/2018  Discharge date: 02/18/2018  Admitted From: Home  Disposition:  Home if able to ambulate   Recommendations for Outpatient Follow-up:   Follow up with PCP in 1-2 weeks  PCP Please obtain BMP/CBC, 2 view CXR in 1week,  (see Discharge instructions)   PCP Please follow up on the following pending results:    Home Health: None   Equipment/Devices: None  Consultations: Case Managaer Discharge Condition: Fair   CODE STATUS: Full   Diet Recommendation: Heart Healthy Low Carb    Chief Complaint  Patient presents with  . Hyperglycemia     Brief history of present illness from the day of admission and additional interim summary    Patient is a 58 y.o. male with history of DM-2-with prior hospitalizations for DKA-brought in for evaluation of altered mental status-found to have acute metabolic encephalopathy in the setting of DKA.  See below for further details                                                                 Hospital Course   DKA in a DM type II patient dehydration and hypernatremia.  Suspect medication noncompliance, most recent A1c in December was 10.7.  Was treated with DKA protocol now DKA has resolved, back to his baseline, he was counseled on compliance, he was given refills of his home insulin and diabetic testing supplies.  Case management requested to help him with medication procurement.  Return instructions on Accu-Cheks also provided.  Acute metabolic encephalopathy: Secondary to DKA, AKI and hypernatremia-he is improving-slightly slow to respond but do not think he is that different from his usual baseline.   AKI: Due to dehydration resolved after hydration.    Hypernatremia:  #1 above.   Hypertension:  Will be discharged on beta-blocker and low-dose ACE inhibitor PCP to monitor and adjust.  Prescriptions provided.  Medication noncompliance: Counseled extensively.  Deconditioning/debility: PT reevaluation, patient unable to walk and we will keep him here as he has no insurance and cannot be placed to SNF.   Discharge diagnosis     Active Problems:   DKA (diabetic ketoacidoses) [facility])    Discharge instructions    Discharge Instructions    Discharge instructions   Complete by:  As directed    Follow with Primary MD [name], Audrea Muscat, NP in 7 days   Get CBC, BMP   checked  by Primary MD   in 5-7 days    Activity: As tolerated with Full fall precautions use walker/cane & assistance as needed  Disposition Home    Diet: Heart Healthy Low Carb.  Accuchecks 4 times/day, Once in AM empty stomach and then before each meal. Log in all results and  show them to your Prim.MD in 3 days. If any glucose reading is under 80 or above 300 call your Prim MD immidiately. Follow Low glucose instructions for glucose under 80 as instructed.   Special Instructions: If you have smoked or chewed Tobacco  in the last 2 yrs please stop smoking, stop any regular Alcohol  and or any Recreational drug use.  On your next visit with your primary care physician please Get Medicines reviewed and adjusted.  Please request your Prim.MD to go over all Hospital Tests and Procedure/Radiological results at the follow up, please get all Hospital records sent to your Prim MD by signing hospital release before you go home.  If you experience worsening of your admission symptoms, develop shortness of breath, life threatening emergency, suicidal or homicidal thoughts you must seek medical attention immediately by calling 911 or calling your MD immediately  if symptoms less severe.  You Must read complete instructions/literature along with all the possible adverse reactions/side effects  for all the Medicines you take and that have been prescribed to you. Take any new Medicines after you have completely understood and accpet all the possible adverse reactions/side effects.   Do not drive, operate heavy machinery, perform activities at heights, swimming or participation in water activities or provide baby sitting services if your were admitted for syncope or siezures until you have seen by Primary MD or a Neurologist and advised to do so again.  Do not drive when taking Pain medications.  Do not take more than prescribed Pain, Sleep and Anxiety Medications  Wear Seat belts while driving.   Please note  You were cared for by a hospitalist during your hospital stay. If you have any questions about your discharge medications or the care you received while you were in the hospital after you are discharged, you can call the unit and asked to speak with the hospitalist on call if the hospitalist that took care of you is not available. Once you are discharged, your primary care physician will handle any further medical issues. Please note that NO REFILLS for any discharge medications will be authorized once you are discharged, as it is imperative that you return to your primary care physician (or establish a relationship with a primary care physician if you do not have one) for your aftercare needs so that they can reassess your need for medications and monitor your lab values.   Increase activity slowly   Complete by:  As directed       Discharge Medications   Allergies as of 02/18/2018      Reactions   Ibuprofen Nausea And Vomiting, Other (See Comments)   Makes the patient feel bloated also   Tylenol [acetaminophen] Nausea And Vomiting, Other (See Comments)   Makes the patient feel bloated also      Medication List    TAKE these medications   blood glucose meter kit and supplies Kit Dispense based on patient and insurance preference. Use up to four times daily as directed.  (FOR ICD-9 250.00, 250.01). What changed:  Another medication with the same name was removed. Continue taking this medication, and follow the directions you see here.   insulin NPH-regular Human (70-30) 100 UNIT/ML injection Commonly known as:  NOVOLIN 70/30 Inject 20 Units into the skin 2 (two) times daily with a meal for 30 days.   Insulin Pen Needle 31G X 6 MM Misc 1 Device by Does not apply route 2 (two) times daily.   lisinopril 5  MG tablet Commonly known as:  PRINIVIL,ZESTRIL Take 1 tablet (5 mg total) by mouth daily. What changed:    medication strength  how much to take   metoprolol tartrate 25 MG tablet Commonly known as:  LOPRESSOR Take 1 tablet (25 mg total) by mouth 2 (two) times daily. What changed:  how much to take       Follow-up Information    [name], Audrea Muscat, NP. Schedule an appointment as soon as possible for a visit in 1 week(s).   Contact information: [place] [address] [phone]           Major procedures and Radiology Reports - PLEASE review detailed and final reports thoroughly  -        Dg Chest Portable 1 View  Result Date: 02/14/2018 CLINICAL DATA:  Initial evaluation for acute fever, hyperglycemia. EXAM: PORTABLE CHEST 1 VIEW COMPARISON:  Prior radiograph from 12/23/2017 FINDINGS: Cardiac and mediastinal silhouettes are stable in size and contour, and remain within normal limits. Lungs well inflated. No infiltrates, edema, or pleural effusion. No pneumothorax. Nodular densities overlying the mid lungs bilaterally most consistent with nipple shadows. No acute osseous finding. IMPRESSION: No radiographic evidence for active cardiopulmonary disease. Electronically Signed   By: Jeannine Boga M.D.   On: 02/14/2018 13:17    Micro Results     Recent Results (from the past 240 hour(s))  MRSA PCR Screening     Status: None   Collection Time: 02/14/18  5:27 PM  Result Value Ref Range Status   MRSA by PCR  NEGATIVE NEGATIVE Final    Comment:        The GeneXpert MRSA Assay (FDA approved for NASAL specimens only), is one component of a comprehensive MRSA colonization surveillance program. It is not intended to diagnose MRSA infection nor to guide or monitor treatment for MRSA infections. Performed at [facility], [place] [address], [place], [place] [address]     Today   Subjective    [place] today has no headache,no chest abdominal pain,no new weakness tingling or numbness, feels much better.   Objective   Blood pressure (!) 148/93, pulse 73, temperature 98.6 F (37 C), temperature source Oral, resp. rate 18, height '6\' 4"'$  (1.93 m), weight 56.2 kg, SpO2 98 %.   Intake/Output Summary (Last 24 hours) at 02/18/2018 0956 Last data filed at 02/17/2018 1820 Gross per 24 hour  Intake 1061.34 ml  Output 300 ml  Net 761.34 ml    Exam  Awake Alert, Oriented x 3, No new F.N deficits, Flat affect [place].AT,PERRAL Supple Neck,No JVD, No cervical lymphadenopathy appriciated.  Symmetrical Chest wall movement, Good air movement bilaterally, CTAB RRR,No Gallops,Rubs or new Murmurs, No Parasternal Heave +ve B.Sounds, Abd Soft, Non tender, No organomegaly appriciated, No rebound -guarding or rigidity. No Cyanosis, Clubbing or edema, No new Rash or bruise   Data Review   CBC w Diff:  Lab Results  Component Value Date   WBC 11.1 (H) 02/14/2018   HGB 11.9 (L) 02/14/2018   HCT 35.0 (L) 02/14/2018   HCT 32.9 (L) 08/08/2017   PLT 356 02/14/2018   LYMPHOPCT 20 02/09/2018   BANDSPCT 0 03/06/2017   MONOPCT 7 02/09/2018   EOSPCT 2 02/09/2018   BASOPCT 0 02/09/2018    CMP:  Lab Results  Component Value Date   NA 146 (H) 02/18/2018   K 3.3 (L) 02/18/2018   CL 110 02/18/2018   CO2 24 02/18/2018   BUN  25 (H) 02/18/2018   CREATININE 1.02 02/18/2018   PROT 7.2 02/09/2018   ALBUMIN 3.5 02/09/2018   BILITOT 0.2 (L) 02/09/2018   ALKPHOS 99 02/09/2018   AST 26 02/09/2018   ALT  68 (H) 02/09/2018  .  Lab Results  Component Value Date   HGBA1C 10.7 (H) 12/25/2017    Total Time in preparing paper work, data evaluation and todays exam - 92 minutes  Lala Lund M.D on 02/18/2018 at 9:56 AM  [facility]   Office  [phone]

## 2018-02-18 NOTE — Progress Notes (Signed)
Went to [facility] to pick up patient medications, the meds that was faxed over earlier today are still not ready. The have the match leer and faxed meds , just not filled.

## 2018-02-18 NOTE — Progress Notes (Signed)
Pt BP at 2052 was 189/101 with HR in the 60's. Patient received his scheduled 25mg  of metoprolol and when his BP was rechecked at 2119 and at 2308 and both systolic blood pressures were greater than 185. PRN hydralazine was given and patient's systolic BP increased to the 190's. After PRN hydralazine, pt's BP decreased to 185/101. Blount,NP notified of patient's rising BP. NP placed one time order for 1.25mg  of Vasotec and pt's BP came down to 143/84. Will continue to monitor and treat per MD orders.

## 2018-02-18 NOTE — Discharge Instructions (Signed)
Follow with Primary MD [name], [name], NP in 7 days   Get CBC, BMP   checked  by Primary MD   in 5-7 days    Activity: As tolerated with Full fall precautions use walker/cane & assistance as needed  Disposition Home    Diet: Heart Healthy Low Carb.  Accuchecks 4 times/day, Once in AM empty stomach and then before each meal. Log in all results and show them to your Prim.MD in 3 days. If any glucose reading is under 80 or above 300 call your Prim MD immidiately. Follow Low glucose instructions for glucose under 80 as instructed.   Special Instructions: If you have smoked or chewed Tobacco  in the last 2 yrs please stop smoking, stop any regular Alcohol  and or any Recreational drug use.  On your next visit with your primary care physician please Get Medicines reviewed and adjusted.  Please request your Prim.MD to go over all Hospital Tests and Procedure/Radiological results at the follow up, please get all Hospital records sent to your Prim MD by signing hospital release before you go home.  If you experience worsening of your admission symptoms, develop shortness of breath, life threatening emergency, suicidal or homicidal thoughts you must seek medical attention immediately by calling 911 or calling your MD immediately  if symptoms less severe.  You Must read complete instructions/literature along with all the possible adverse reactions/side effects for all the Medicines you take and that have been prescribed to you. Take any new Medicines after you have completely understood and accpet all the possible adverse reactions/side effects.   Do not drive, operate heavy machinery, perform activities at heights, swimming or participation in water activities or provide baby sitting services if your were admitted for syncope or siezures until you have seen by Primary MD or a Neurologist and advised to do so again.  Do not drive when taking Pain medications.  Do not take more than prescribed  Pain, Sleep and Anxiety Medications  Wear Seat belts while driving.   Please note  You were cared for by a hospitalist during your hospital stay. If you have any questions about your discharge medications or the care you received while you were in the hospital after you are discharged, you can call the unit and asked to speak with the hospitalist on call if the hospitalist that took care of you is not available. Once you are discharged, your primary care physician will handle any further medical issues. Please note that NO REFILLS for any discharge medications will be authorized once you are discharged, as it is imperative that you return to your primary care physician (or establish a relationship with a primary care physician if you do not have one) for your aftercare needs so that they can reassess your need for medications and monitor your lab values.

## 2018-02-18 NOTE — Progress Notes (Signed)
PT Cancellation Note  Patient Details Name: [name] MRN: [id number] DOB: [birth year]   Cancelled Treatment:    Reason Eval/Treat Not Completed: Fatigue/lethargy limiting ability to participate.  Pt in bed, did not want to get up and will try again at another time.   Ivar Drape 02/18/2018, 3:30 PM   Samul Dada, PT MS Acute Rehab Dept. Number: [facility] [id number] and [facility] [phone]

## 2018-02-18 NOTE — Progress Notes (Signed)
CSW received consult regarding SNF placement. CSW staffed case with CSW AD regarding LOG eligibility. Per AD, patient unable to meet criteria for LOG and there are currently no Difficult to Place beds available. Please progress patient and CSW will offer homeless resources/transportation.   Osborne Casco Trissa Molina [name] [phone]

## 2018-02-18 NOTE — Care Management Note (Signed)
Case Management Note  Patient Details  Name: Mike [name] MRN: [id number] Date of Birth: 01-Sep-1960  Subjective/Objective:        DKA , hx of DM. Multi admits. Homeless.          Action/Plan: Transition to shelter today.  Filled Rx meds from [facility] pharmacy will be delivered to pt @ bedside prior to d/c by nurse.  CSW will provide pt with taxi voucher for transportation to the shelter.  Expected Discharge Date:  02/18/18               Expected Discharge Plan:  Home/Self Care  In-House Referral:  NA  Discharge planning Services  CM Consult  Post Acute Care Choice:  NA Choice offered to:  Patient(charity case, rolling walker)  DME Arranged:  Dan Humphreys rolling DME Agency:  [facility]  HH Arranged:  NA HH Agency:  NA  Status of Service:  Completed, signed off  If discussed at Long Length of Stay Meetings, dates discussed:    Additional Comments:  Epifanio Lesches, RN 02/18/2018, 1:22 PM

## 2018-02-18 NOTE — Progress Notes (Signed)
Initial Nutrition Assessment  DOCUMENTATION CODES:   Severe malnutrition in context of chronic illness, Underweight  INTERVENTION:   Ensure Enlive po TID, each supplement provides 350 kcal and 20 grams of protein  Discussed importance of adequate nutrition and blood sugar control to prevent further weight loss.   Discussed menu options, encourage meal preferences   NUTRITION DIAGNOSIS:   Severe Malnutrition related to chronic illness(uncontrolled DM) as evidenced by severe fat depletion, severe muscle depletion.  GOAL:   Patient will meet greater than or equal to 90% of their needs  MONITOR:   PO intake, Supplement acceptance  REASON FOR ASSESSMENT:   Other (Comment)(BMI)    ASSESSMENT:   Pt who is homeless with PMH of HTN, anxiety, type 2 DM, medical noncompliance, multiple admission for DKA, who was admitted for DKA, AKI, and HTN.    Pt slow to respond to questions during interview. He reports weight loss and poor appetite PTA but is unable to provide specifics.  He reports that he does not eat breakfast but he walks to a location for meals, mostly lunch and dinner. He states he likes the food and eats most. He feels that his appetite has been decreased for a few weeks PTA. He reports being a very slow eater. Noted lunch at bedside and was still munching on one piece of broccoli during interview/exam.  Pt states he lost weight PTA but reports that he is 170 lb. However, per bedscale he weighs 124 lb. It appears that for several encounters that his weight was 170 lb, however this would appear to be a stated weight vs a measured weight.  During admission pt's PO intake at meals has been inadequate.  Meal Completion: <25%  Pt does state that he checks his blood sugars and takes his medicine however pt with multiple ED visits/admssions for hyperglycemia/DKA and per MD notes is medically noncompliant.   Spoke with RN at bedside and CSW, d/c plan appears to be pt will go to a  homeless shelter in [place].   Medications reviewed and include: SSI every 4 hours, 3 units novolog TID with meals, 15 units Lantus daily Labs reviewed: Na: 146 (H), K+ 3.3 (L) Lab Results  Component Value Date   HGBA1C 10.7 (H) 12/25/2017   CBG (last 3)  Recent Labs    02/18/18 0419 02/18/18 0750 02/18/18 1136  GLUCAP 137* 126* 118*       NUTRITION - FOCUSED PHYSICAL EXAM:    Most Recent Value  Orbital Region  Moderate depletion  Upper Arm Region  Severe depletion  Thoracic and Lumbar Region  Severe depletion  Buccal Region  Moderate depletion  Temple Region  Severe depletion  Clavicle Bone Region  Severe depletion  Clavicle and Acromion Bone Region  Severe depletion  Scapular Bone Region  Severe depletion  Dorsal Hand  Severe depletion  Patellar Region  Severe depletion  Anterior Thigh Region  Severe depletion  Posterior Calf Region  Severe depletion  Edema (RD Assessment)  None  Hair  Unable to assess  Eyes  Reviewed  Mouth  Reviewed [poor dentition]  Skin  Reviewed  Nails  Reviewed       Diet Order:   Diet Order            Diet heart healthy/carb modified Room service appropriate? Yes; Fluid consistency: Thin  Diet effective now              EDUCATION NEEDS:   Education needs have been addressed  Skin:  Skin Assessment: Reviewed RN Assessment  Last BM:  2/2 - type 1, medium  Height:   Ht Readings from Last 1 Encounters:  02/14/18 6\' 4"  (1.93 m)    Weight:   Wt Readings from Last 1 Encounters:  02/14/18 56.2 kg    Ideal Body Weight:  91.8 kg  BMI:  Body mass index is 15.09 kg/m.  Estimated Nutritional Needs:   Kcal:  2050-2300  Protein:  100-115 grams  Fluid:  >2 L/day  Kendell Bane RD, LDN, CNSC [phone] Pager [phone] After Hours Pager

## 2018-02-19 LAB — GLUCOSE, CAPILLARY
Glucose-Capillary: 116 mg/dL — ABNORMAL HIGH (ref 70–99)
Glucose-Capillary: 143 mg/dL — ABNORMAL HIGH (ref 70–99)
Glucose-Capillary: 155 mg/dL — ABNORMAL HIGH (ref 70–99)

## 2018-02-19 NOTE — Progress Notes (Signed)
PROGRESS NOTE        PATIENT DETAILS Name: Mike [name] Age: 58 y.o. Sex: male Date of Birth: Jan 10, 1961 Admit Date: 02/14/2018 Admitting Physician Carron CurieAli Hijazi, MD ZOX:[id number]:[name], Chales AbrahamsMary Ann, NP  Brief Narrative: Patient is a 58 y.o. male with history of DM-2-with prior hospitalizations for DKA-brought in for evaluation of altered mental status-found to have acute metabolic encephalopathy in the setting of DKA.  See below for further details  Subjective:  Patient in bed, appears comfortable, denies any headache, no fever, no chest pain or pressure, no shortness of breath , no abdominal pain. No focal weakness.   Assessment/Plan:  DKA in a DM type II patient dehydration and hypernatremia.  DKA was due to medication noncompliance, counseled, has been treated with DKA protocol, now back to subcu insulin, monitor and adjust.  Electrolytes have stabilized.  Lab Results  Component Value Date   HGBA1C 10.7 (H) 12/25/2017   CBG (last 3)  Recent Labs    02/18/18 2051 02/19/18 0010 02/19/18 0443  GLUCAP 112* 116* 155*     Acute metabolic encephalopathy: Secondary to DKA, resolved.  AKI: ATN due to dehydration resolved.    Hypernatremia: #1 above.   Hypertension: Controlled on beta-blocker will add Norvasc and monitor.    Medication noncompliance: Counseled extensively.  Deconditioning/debility: Secondary to acute illness-PT recommending SNF.   Social work consulted.   DVT Prophylaxis: Prophylactic Heparin  Code Status: Full code  Family Communication: None at bedside  Disposition Plan: Placement by social work medically stable for discharge  Antimicrobial agents: Anti-infectives (From admission, onward)   None      Procedures: None  CONSULTS:  None  Time spent: 25- minutes-Greater than 50% of this time was spent in counseling, explanation of diagnosis, planning of further management, and coordination of  care.  MEDICATIONS: Scheduled Meds: . amLODipine  10 mg Oral Daily  . feeding supplement (ENSURE ENLIVE)  237 mL Oral TID BM  . heparin  5,000 Units Subcutaneous Q8H  . insulin aspart  0-9 Units Subcutaneous Q4H  . insulin aspart  3 Units Subcutaneous TID WC  . insulin glargine  15 Units Subcutaneous Daily  . mouth rinse  15 mL Mouth Rinse BID  . metoprolol tartrate  25 mg Oral BID   Continuous Infusions:  PRN Meds:.albuterol, hydrALAZINE, ondansetron **OR** ondansetron (ZOFRAN) IV   PHYSICAL EXAM: Vital signs: Vitals:   02/18/18 0903 02/18/18 1441 02/18/18 2050 02/19/18 0444  BP: (!) 148/93 (!) 152/92 (!) 154/87 (!) 155/83  Pulse: 73 63 88 75  Resp:  18 18 17   Temp:  98.4 F (36.9 C)  98.4 F (36.9 C)  TempSrc:  Oral  Oral  SpO2:  100% 100% 100%  Weight:      Height:       Filed Weights   02/14/18 1216  Weight: 56.2 kg   Body mass index is 15.09 kg/m.   Exam  Awake Alert, Oriented X 3, No new F.N deficits, Flat affect [place].AT,PERRAL Supple Neck,No JVD, No cervical lymphadenopathy appriciated.  Symmetrical Chest wall movement, Good air movement bilaterally, CTAB RRR,No Gallops, Rubs or new Murmurs, No Parasternal Heave +ve B.Sounds, Abd Soft, No tenderness, No organomegaly appriciated, No rebound - guarding or rigidity. No Cyanosis, Clubbing or edema, No new Rash or bruise   I have personally reviewed following labs and imaging studies  LABORATORY DATA: CBC:  Recent Labs  Lab 02/14/18 1233 02/14/18 1417  WBC 11.1*  --   HGB 12.3* 11.9*  HCT 40.5 35.0*  MCV 81.7  --   PLT 356  --     Basic Metabolic Panel: Recent Labs  Lab 02/15/18 0201 02/15/18 0706 02/16/18 0518 02/17/18 0351 02/18/18 0450  NA 158* 157* 155* 149* 146*  K 4.0 4.0 3.8 3.7 3.3*  CL 121* 119* 119* 112* 110  CO2 27 27 27 27 24   GLUCOSE 158* 156* 194* 236* 151*  BUN 63* 60* 58* 38* 25*  CREATININE 1.89* 1.72* 1.60* 1.19 1.02  CALCIUM 9.2 9.4 9.6 9.5 9.1  MG  --   --   --   --   2.1    GFR: Estimated Creatinine Clearance: 64.3 mL/min (by C-G formula based on SCr of 1.02 mg/dL).  Liver Function Tests: No results for input(s): AST, ALT, ALKPHOS, BILITOT, PROT, ALBUMIN in the last 168 hours. No results for input(s): LIPASE, AMYLASE in the last 168 hours. No results for input(s): AMMONIA in the last 168 hours.  Coagulation Profile: No results for input(s): INR, PROTIME in the last 168 hours.  Cardiac Enzymes: No results for input(s): CKTOTAL, CKMB, CKMBINDEX, TROPONINI in the last 168 hours.  BNP (last 3 results) No results for input(s): PROBNP in the last 8760 hours.  HbA1C: No results for input(s): HGBA1C in the last 72 hours.  CBG: Recent Labs  Lab 02/18/18 1136 02/18/18 1704 02/18/18 2051 02/19/18 0010 02/19/18 0443  GLUCAP 118* 131* 112* 116* 155*    Lipid Profile: No results for input(s): CHOL, HDL, LDLCALC, TRIG, CHOLHDL, LDLDIRECT in the last 72 hours.  Thyroid Function Tests: No results for input(s): TSH, T4TOTAL, FREET4, T3FREE, THYROIDAB in the last 72 hours.  Anemia Panel: No results for input(s): VITAMINB12, FOLATE, FERRITIN, TIBC, IRON, RETICCTPCT in the last 72 hours.  Urine analysis:    Component Value Date/Time   COLORURINE YELLOW 02/15/2018 0510   APPEARANCEUR HAZY (A) 02/15/2018 0510   LABSPEC 1.021 02/15/2018 0510   PHURINE 5.0 02/15/2018 0510   GLUCOSEU >=500 (A) 02/15/2018 0510   HGBUR SMALL (A) 02/15/2018 0510   BILIRUBINUR NEGATIVE 02/15/2018 0510   KETONESUR NEGATIVE 02/15/2018 0510   PROTEINUR 30 (A) 02/15/2018 0510   UROBILINOGEN 1.0 02/07/2011 1605   NITRITE NEGATIVE 02/15/2018 0510   LEUKOCYTESUR NEGATIVE 02/15/2018 0510    Sepsis Labs: Lactic Acid, Venous    Component Value Date/Time   LATICACIDVEN 3.17 (HH) 12/23/2017 0642    MICROBIOLOGY: Recent Results (from the past 240 hour(s))  MRSA PCR Screening     Status: None   Collection Time: 02/14/18  5:27 PM  Result Value Ref Range Status   MRSA  by PCR NEGATIVE NEGATIVE Final    Comment:        The GeneXpert MRSA Assay (FDA approved for NASAL specimens only), is one component of a comprehensive MRSA colonization surveillance program. It is not intended to diagnose MRSA infection nor to guide or monitor treatment for MRSA infections. Performed at [facility], [address] [place], [place] [address]     RADIOLOGY STUDIES/RESULTS: Dg Chest Portable 1 View  Result Date: 02/14/2018 CLINICAL DATA:  Initial evaluation for acute fever, hyperglycemia. EXAM: PORTABLE CHEST 1 VIEW COMPARISON:  Prior radiograph from 12/23/2017 FINDINGS: Cardiac and mediastinal silhouettes are stable in size and contour, and remain within normal limits. Lungs well inflated. No infiltrates, edema, or pleural effusion. No pneumothorax. Nodular densities overlying the mid lungs bilaterally  most consistent with nipple shadows. No acute osseous finding. IMPRESSION: No radiographic evidence for active cardiopulmonary disease. Electronically Signed   By: Rise Mu M.D.   On: 02/14/2018 13:17     LOS: 5 days   Signature  Susa Raring M.D on 02/19/2018 at 8:23 AM   -  To page go to [email or web address]

## 2018-02-19 NOTE — Progress Notes (Signed)
CSW provided clothes and taxi voucher to [facility] (they have 2 beds left).  CSW signing off.  Osborne Casco Olden Klauer [name] [phone]

## 2018-02-19 NOTE — Progress Notes (Signed)
Pt awaiting taxi pickup at this time.  Nsg Discharge Note  Admit Date:  02/14/2018 Discharge date: 02/19/2018   Manoj Brim to be D/C'd [facility] per MD order.  AVS completed.  Copy for chart, and copy for patient signed, and dated. Patient/caregiver able to verbalize understanding.  Discharge Medication: Allergies as of 02/19/2018      Reactions   Ibuprofen Nausea And Vomiting, Other (See Comments)   Makes the patient feel bloated also   Tylenol [acetaminophen] Nausea And Vomiting, Other (See Comments)   Makes the patient feel bloated also      Medication List    TAKE these medications   blood glucose meter kit and supplies Kit Dispense based on patient and insurance preference. Use up to four times daily as directed. (FOR ICD-9 250.00, 250.01). What changed:  Another medication with the same name was removed. Continue taking this medication, and follow the directions you see here.   insulin NPH-regular Human (70-30) 100 UNIT/ML injection Commonly known as:  NOVOLIN 70/30 Inject 20 Units into the skin 2 (two) times daily with a meal for 30 days.   Insulin Pen Needle 31G X 6 MM Misc 1 Device by Does not apply route 2 (two) times daily.   lisinopril 5 MG tablet Commonly known as:  PRINIVIL,ZESTRIL Take 1 tablet (5 mg total) by mouth daily. What changed:    medication strength  how much to take   metoprolol tartrate 25 MG tablet Commonly known as:  LOPRESSOR Take 1 tablet (25 mg total) by mouth 2 (two) times daily. What changed:  how much to take            Durable Medical Equipment  (From admission, onward)         Start     Ordered   02/18/18 1233  For home use only DME Walker rolling  Once    Question:  Patient needs a walker to treat with the following condition  Answer:  Weakness   02/18/18 1233          Discharge Assessment: Vitals:   02/18/18 2050 02/19/18 0444  BP: (!) 154/87 (!) 155/83  Pulse: 88 75  Resp: 18 17  Temp:  98.4 F  (36.9 C)  SpO2: 100% 100%   Skin clean, dry and intact without evidence of skin break down, no evidence of skin tears noted. IV catheter discontinued intact. Site without signs and symptoms of complications - no redness or edema noted at insertion site, patient denies c/o pain - only slight tenderness at site.  Dressing with slight pressure applied.  D/c Instructions-Education: Discharge instructions given to patient/family with verbalized understanding. D/c education completed with patient/family including follow up instructions, medication list, d/c activities limitations if indicated, with other d/c instructions as indicated by MD - patient able to verbalize understanding, all questions fully answered. Patient instructed to return to ED, call 911, or call MD for any changes in condition.  Patient escorted via [place], and D/C home via private auto.  Erasmo Leventhal, RN 02/19/2018 11:48 AM

## 2018-05-07 IMAGING — MR MR MRA HEAD W/O CM
1 series · 20 of 48 positions shown · non-contrast
Comparison: Brain MRI from 2 days prior

CLINICAL DATA: Stroke follow-up.

EXAM:
MRA HEAD WITHOUT CONTRAST
TECHNIQUE: Angiographic images of the Circle of Willis were obtained using MRA
technique without intravenous contrast.

[Series 3: (id) mt fs · axial · 1.4mm · 0.43mm/px · z∈[-62,+28]mm · 20 of 136 slices shown]
[im 1/136]
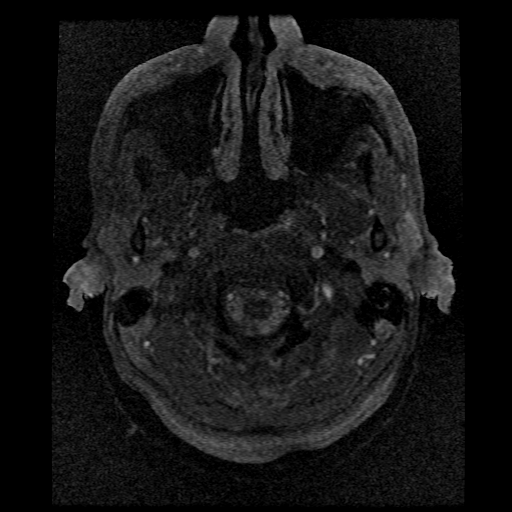
[im 3/136]
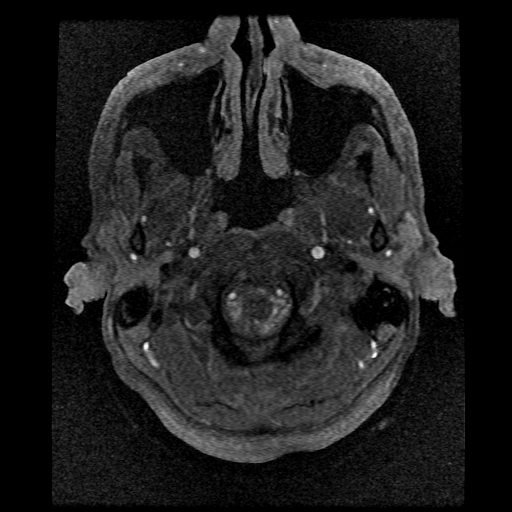
[im 6/136]
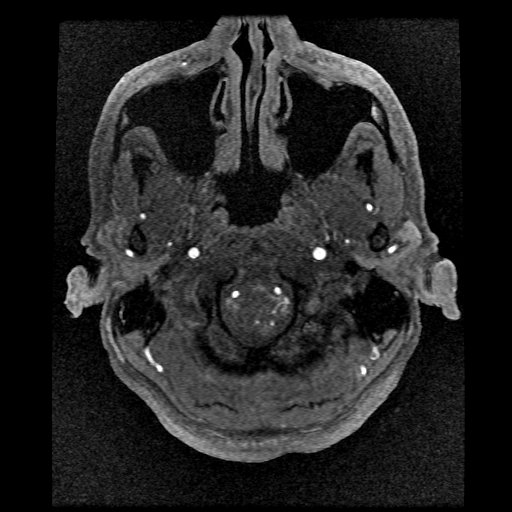
[im 9/136]
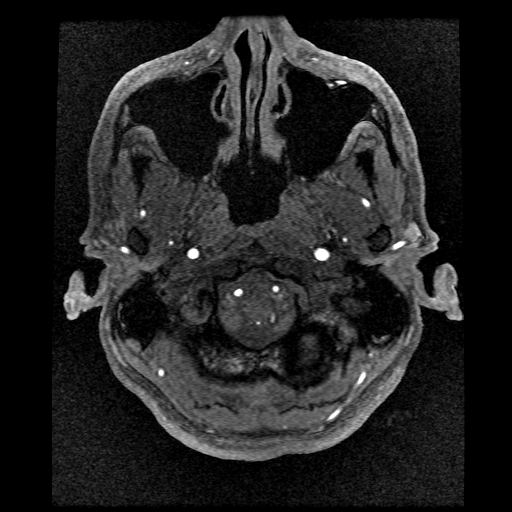
[im 12/136]
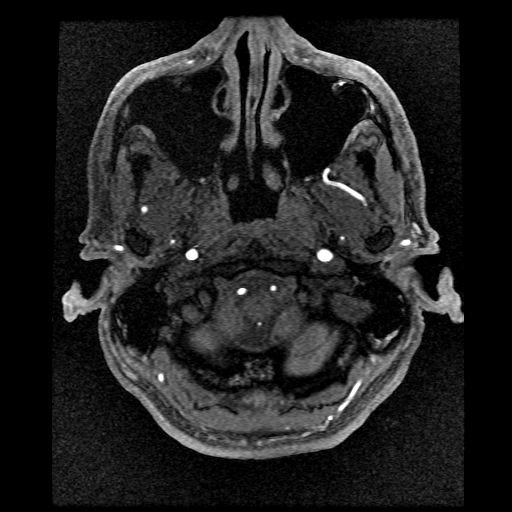
[im 15/136]
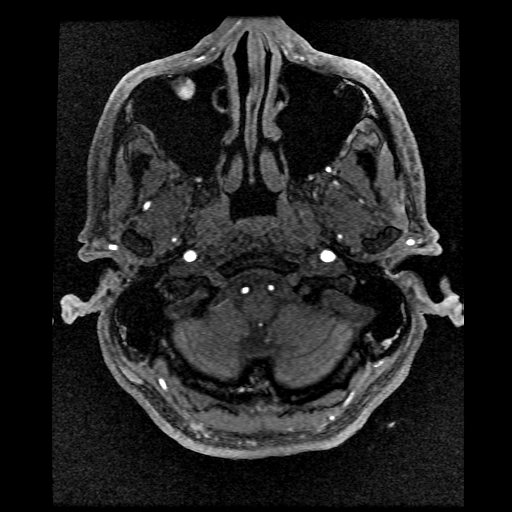
[im 18/136]
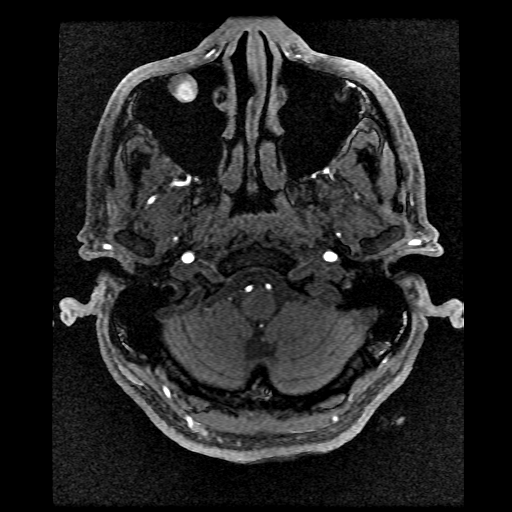
[im 21/136]
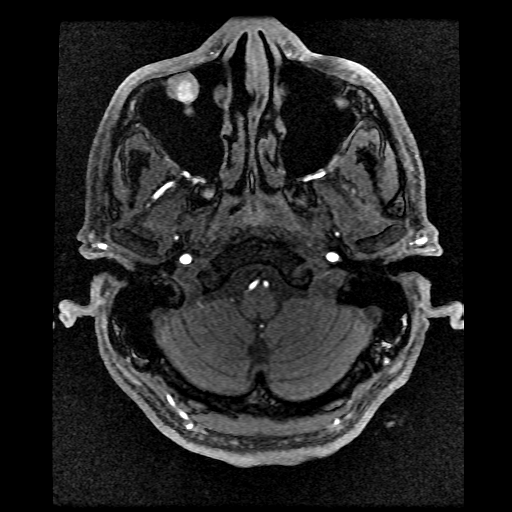
[im 23/136]
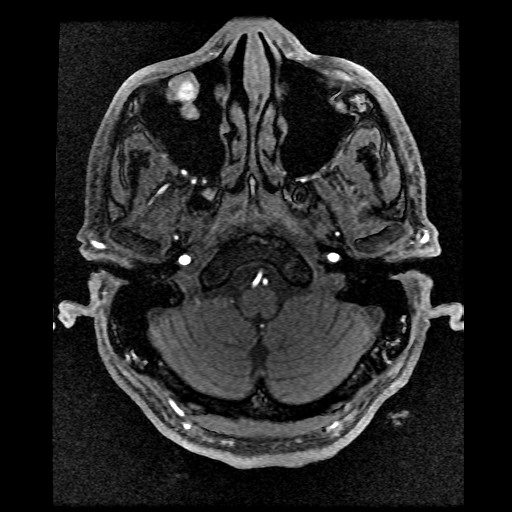
[im 26/136]
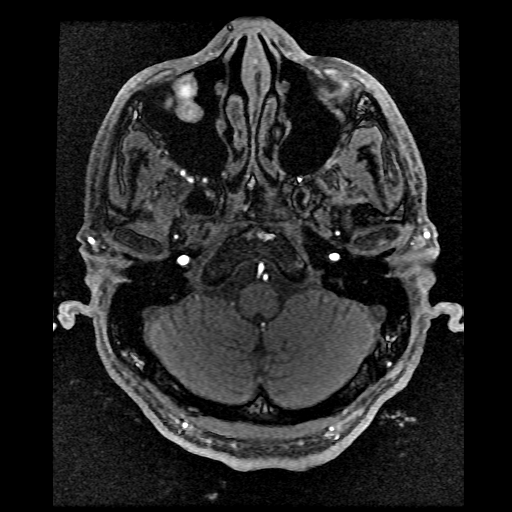
[im 29/136]
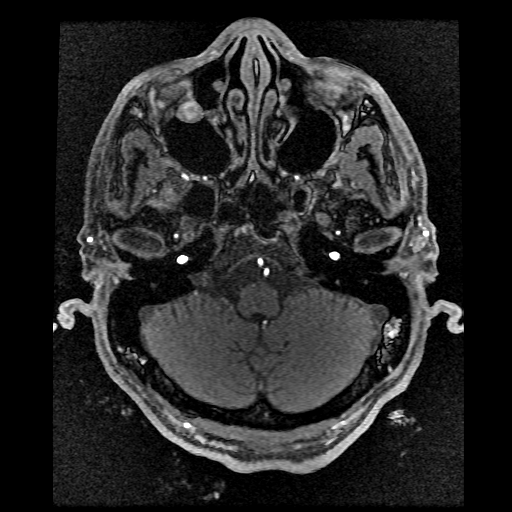
[im 32/136]
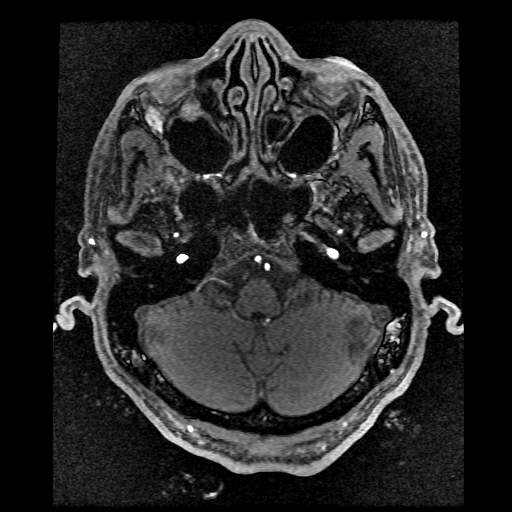
[im 44/136]
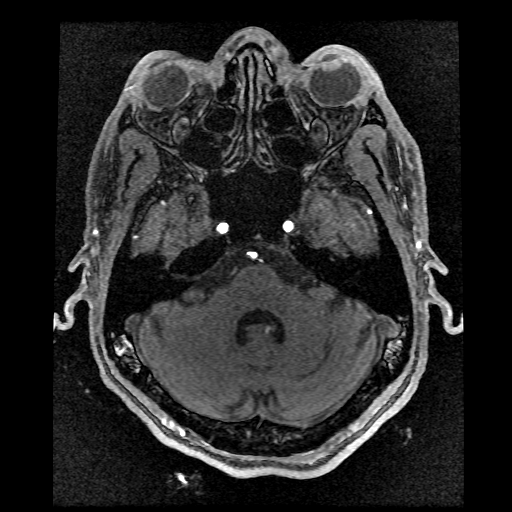
[im 61/136]
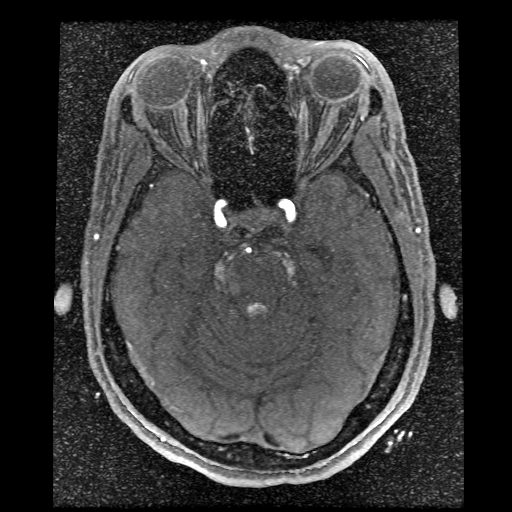
[im 69/136]
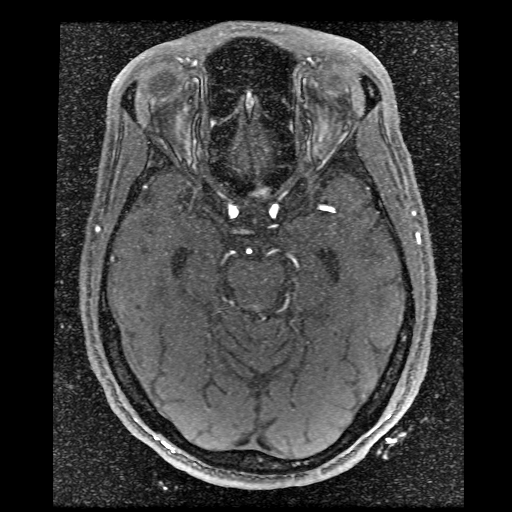
[im 78/136]
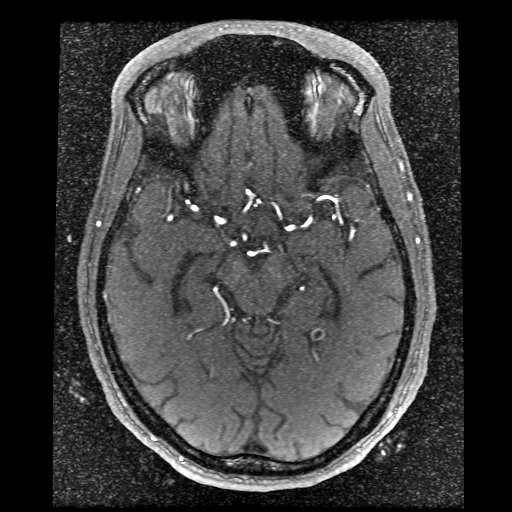
[im 95/136]
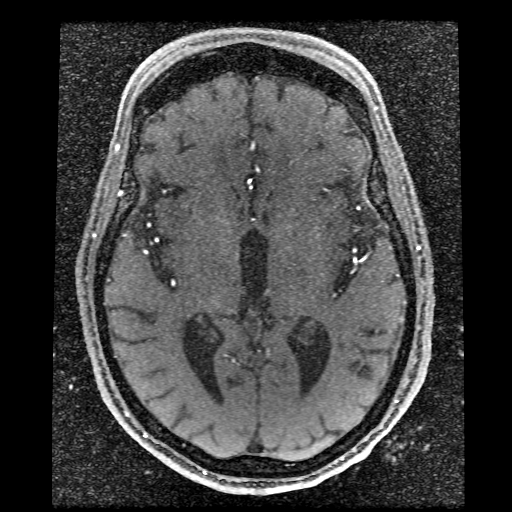
[im 113/136]
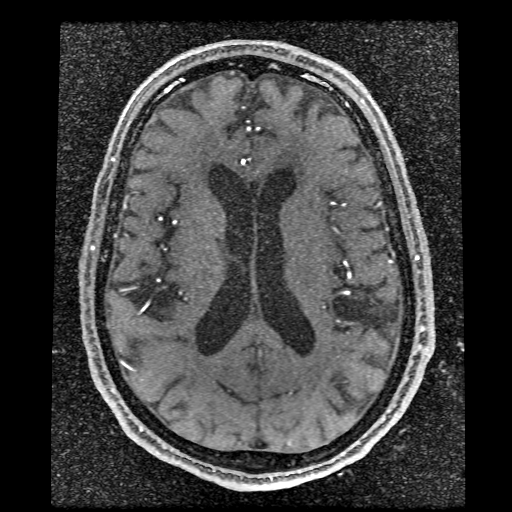
[im 115/136]
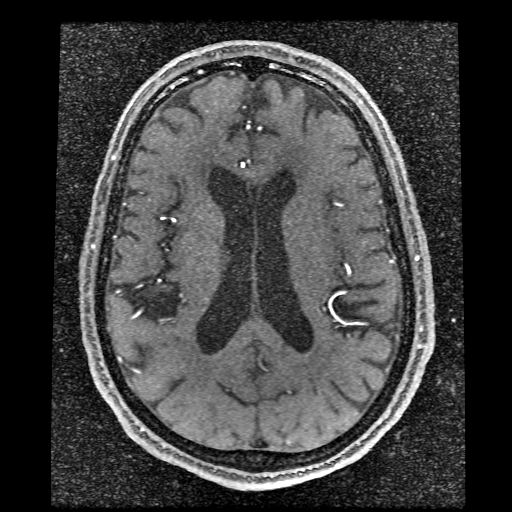
[im 130/136]
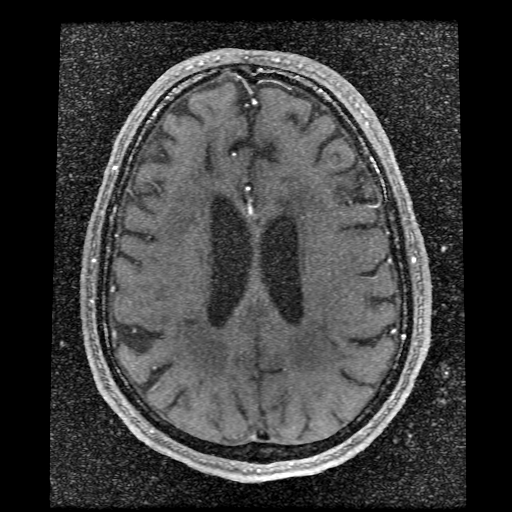

[20 of 48 positions shown; findings below may reference images not displayed]

FINDINGS: Symmetric carotid and vertebral arteries. Incomplete
circle-of-Willis. No major branch occlusion or flow limiting
stenosis. Negative for beading, aneurysm or vascular malformation.
IMPRESSION: Negative intracranial MRA.

## 2018-12-27 IMAGING — CR DG CHEST 2V
3 series · 3 of 3 positions shown · non-contrast
Comparison: Chest radiograph performed 02/11/2017

CLINICAL DATA: Acute onset of hyperglycemia and generalized chest
pain. Leukocytosis.

EXAM:
CHEST - 2 VIEW

[chest lat]
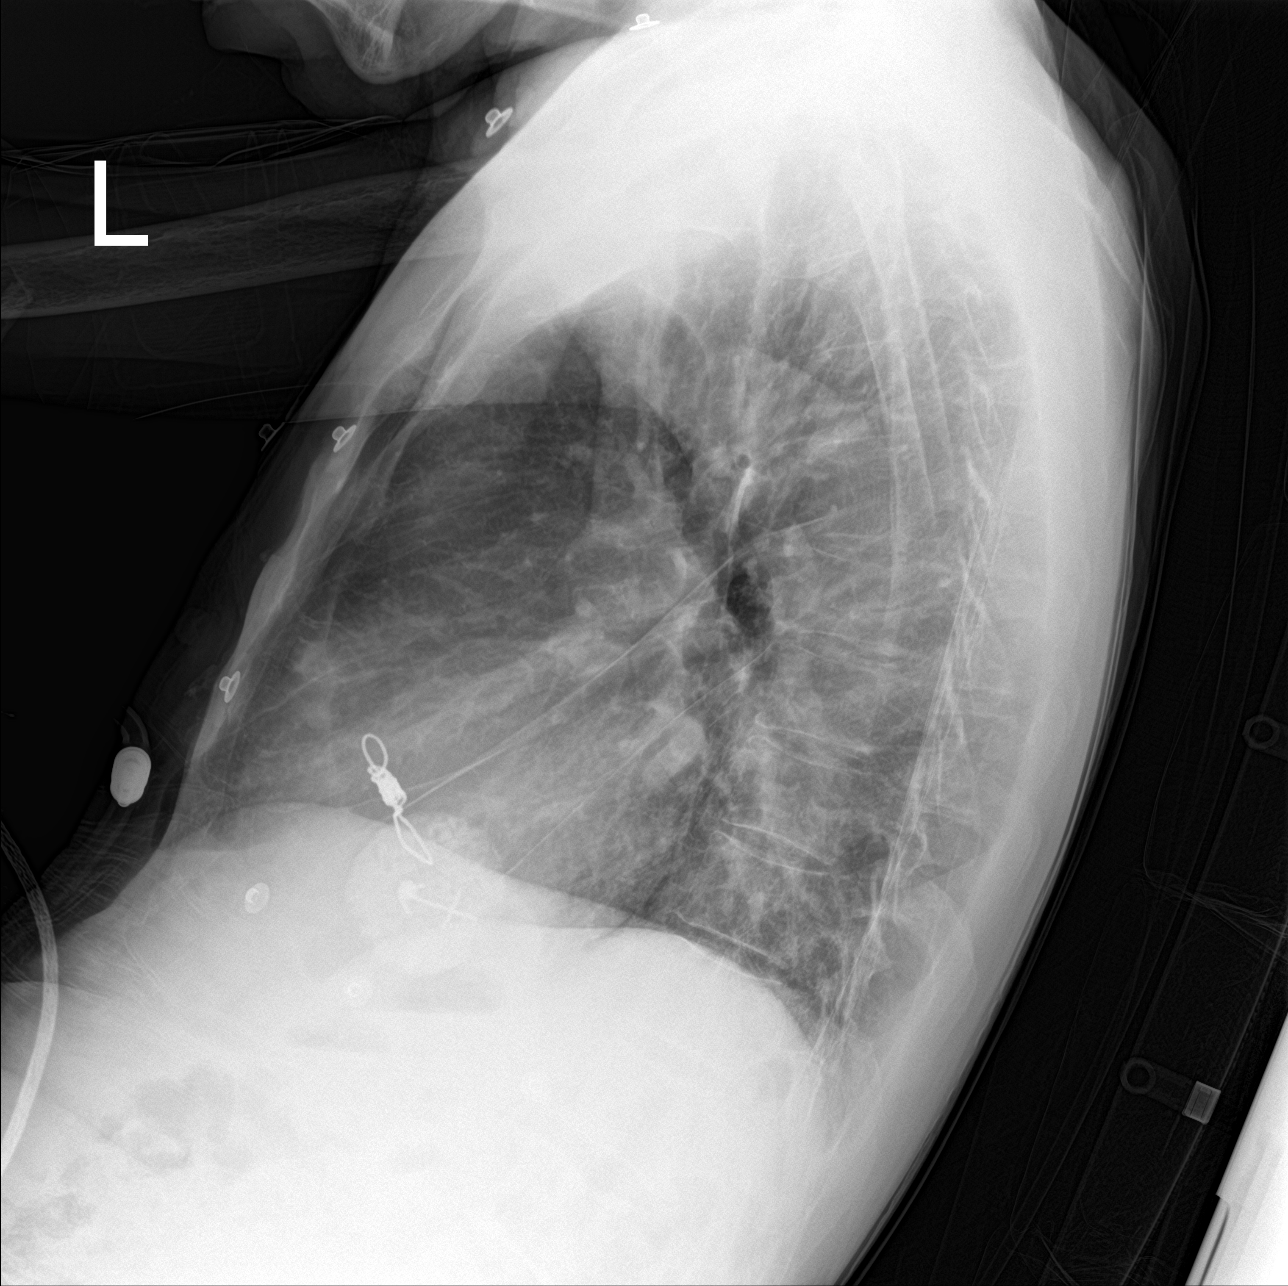

[chest ap (1 of 2)]
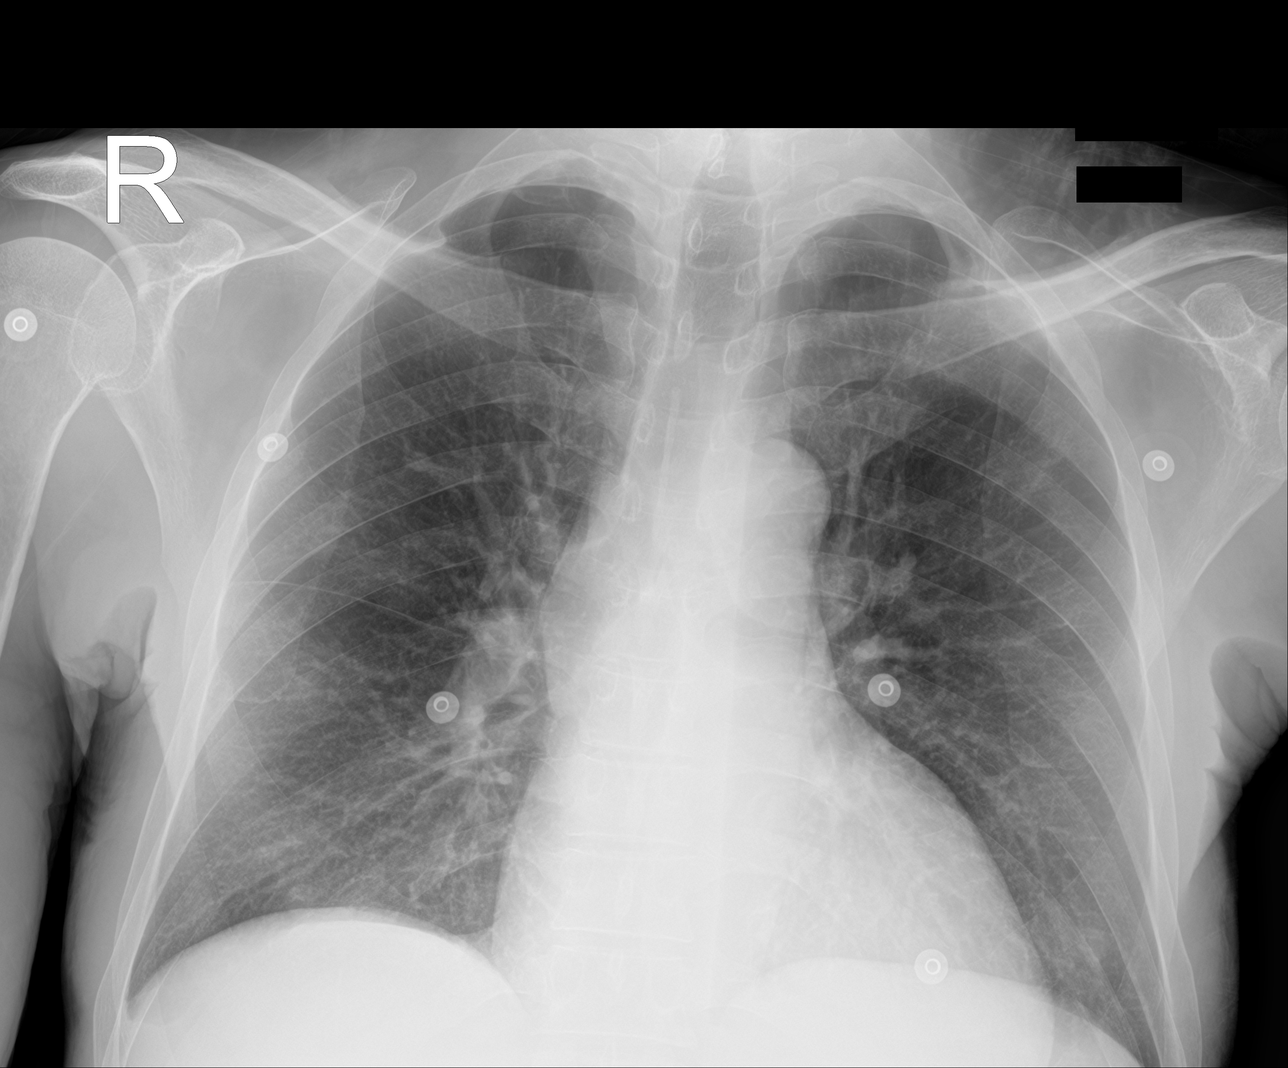

[chest ap (2 of 2)]
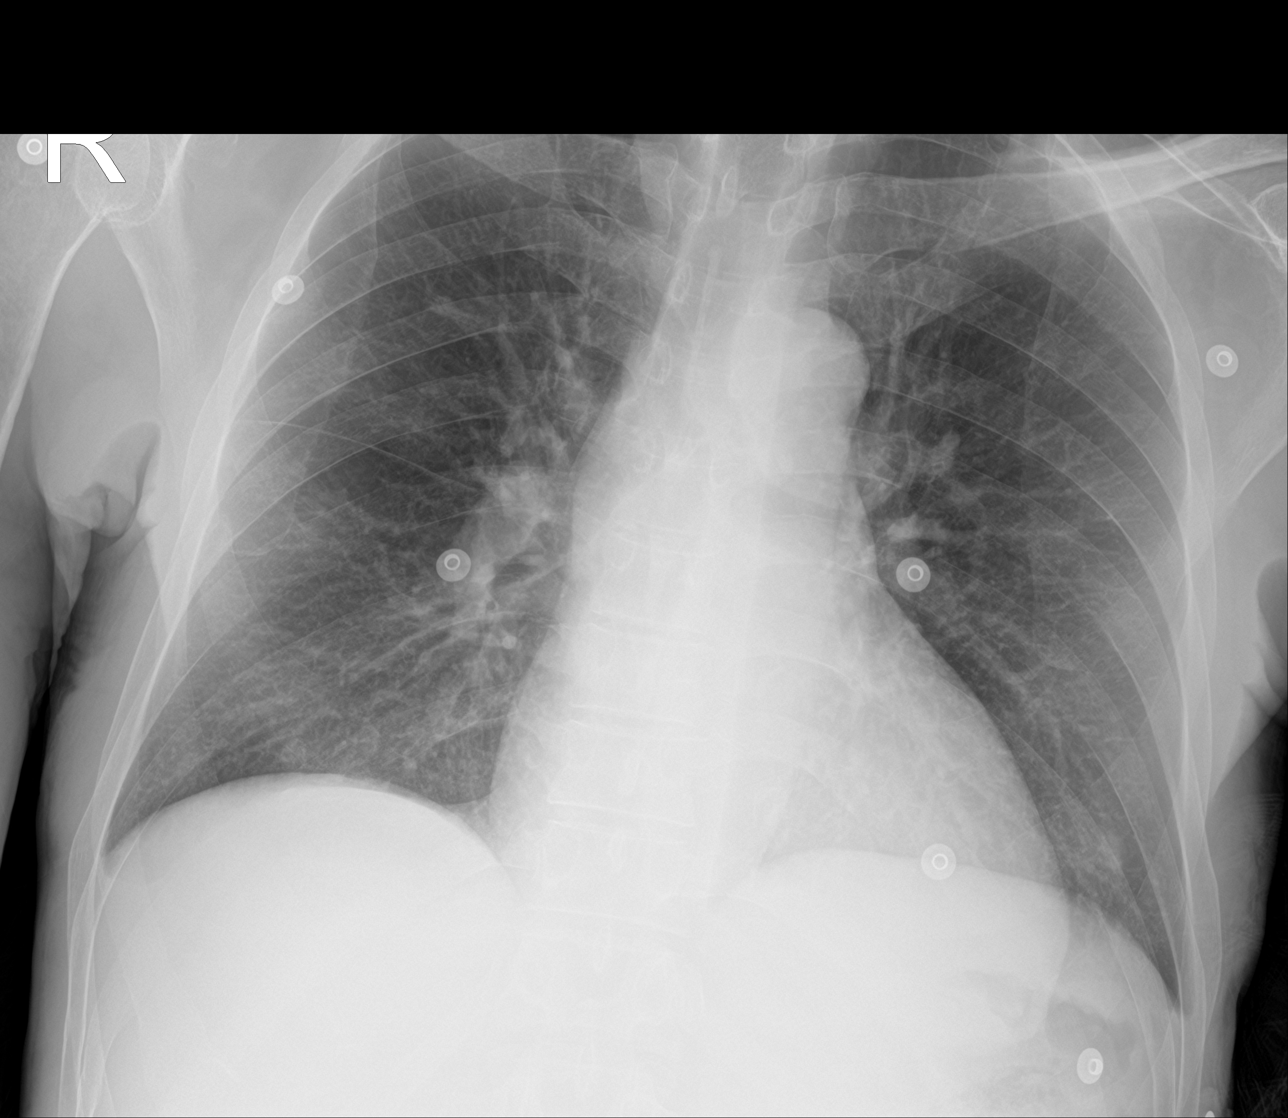

[3 of 3 positions shown; findings below may reference images not displayed]

FINDINGS: The lungs are well-aerated and clear. There is no evidence of focal
opacification, pleural effusion or pneumothorax.

The heart is normal in size; the mediastinal contour is within
normal limits. No acute osseous abnormalities are seen.
IMPRESSION: No acute cardiopulmonary process seen.

## 2019-09-24 IMAGING — DX DG CHEST 1V PORT
1 series · 2 of 2 positions shown · non-contrast
Comparison: Chest radiograph dated 03/27/2017

CLINICAL DATA: 57-year-old male with hypotension.

EXAM:
PORTABLE CHEST 1 VIEW

[Series 1: chest · 0.14mm/px · 2 of 2 slices shown]
[im 1/2]
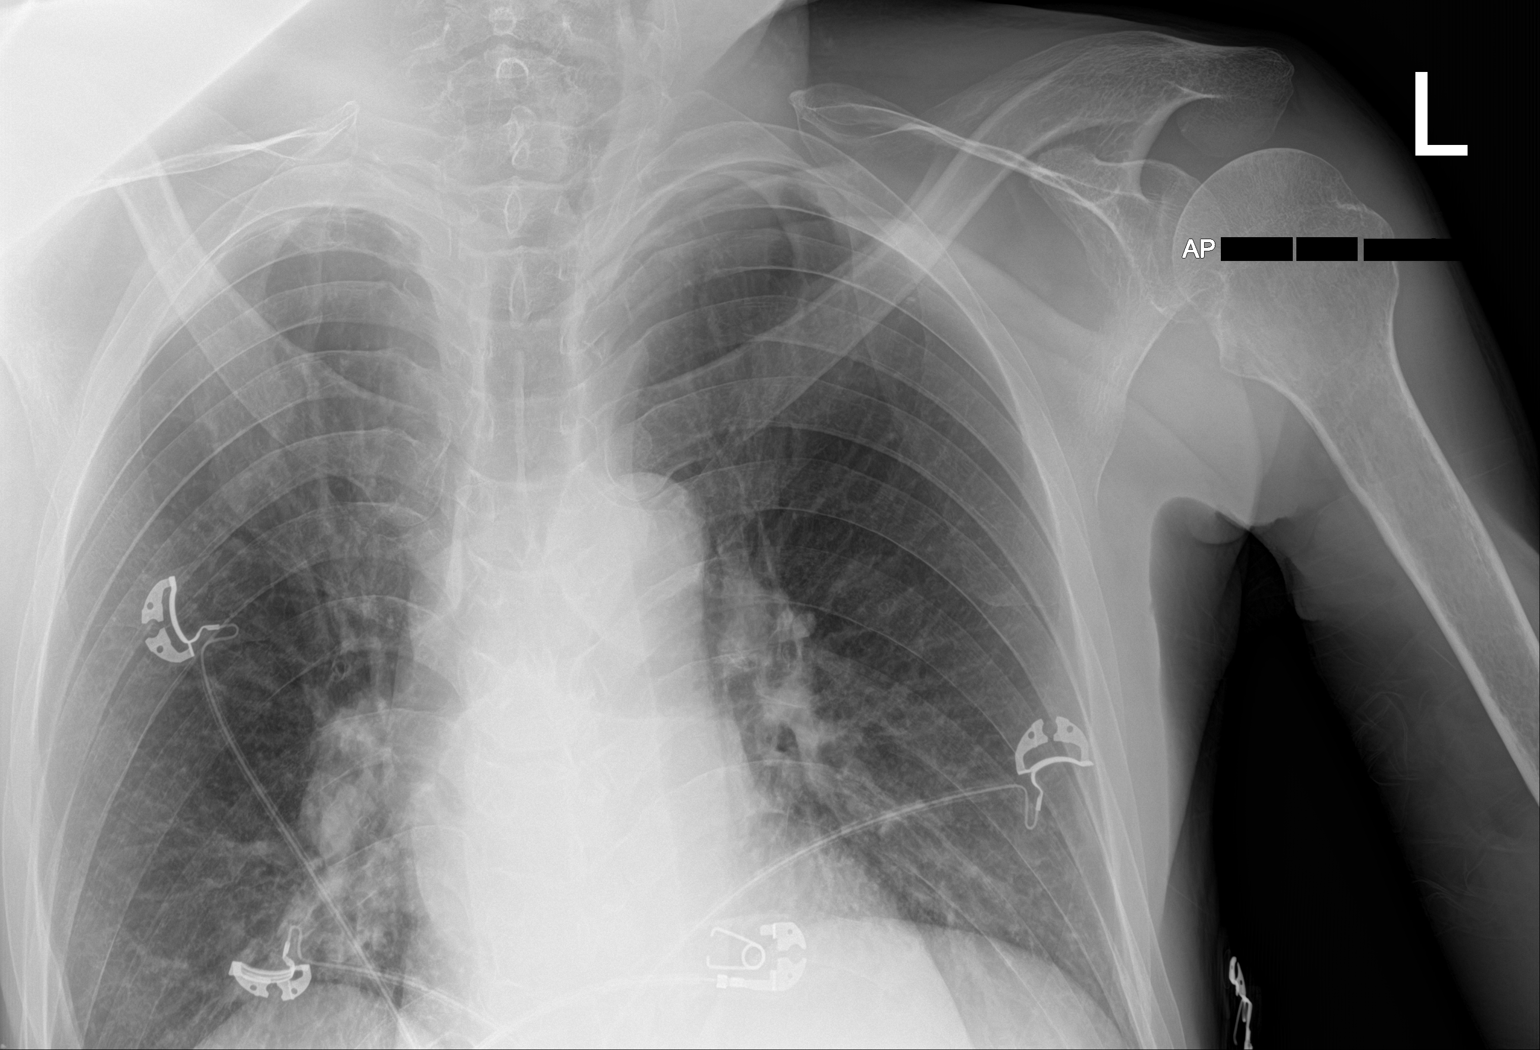
[im 2/2]
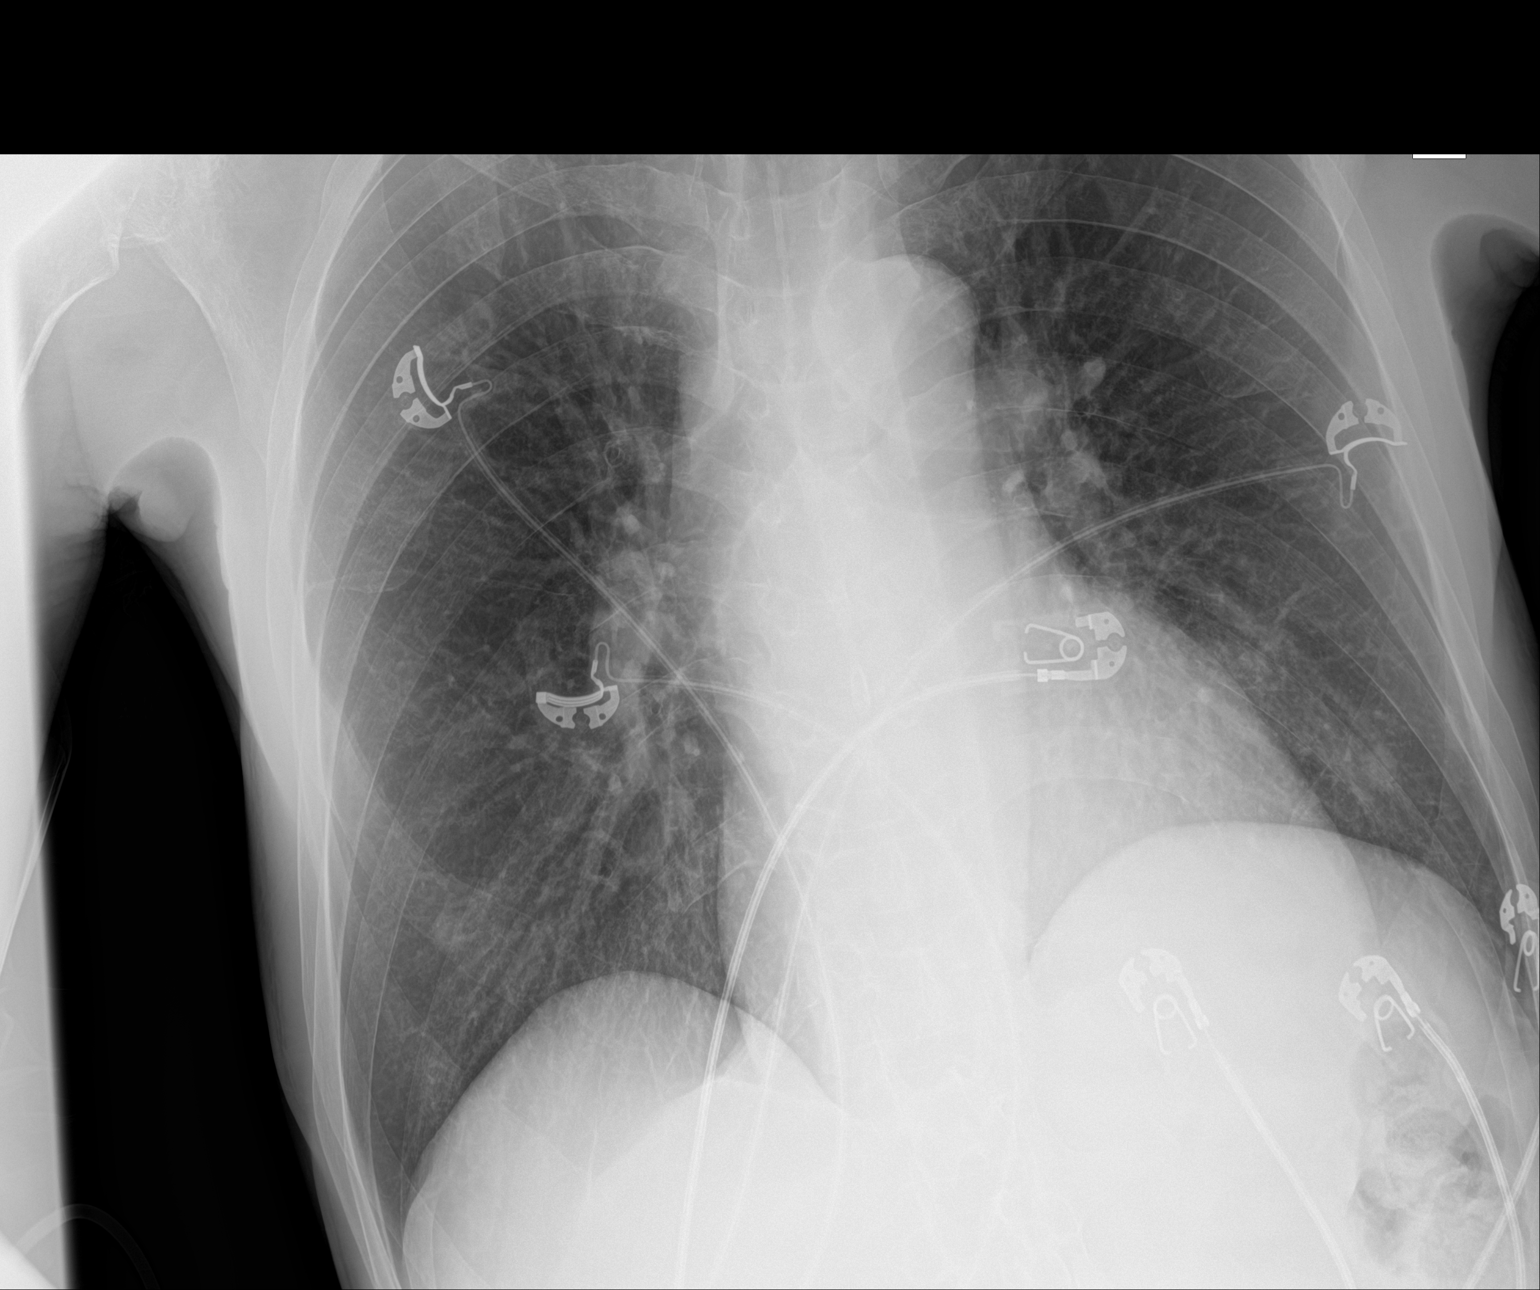

[2 of 2 positions shown; findings below may reference images not displayed]

FINDINGS: The lungs are clear. There is no pleural effusion or pneumothorax.
Bilateral nipple shadows noted. Borderline cardiomegaly. No acute
osseous pathology. Borderline cardiomegaly.
IMPRESSION: No active disease.
